# Patient Record
Sex: Female | Born: 1935
Health system: Southern US, Community
[De-identification: ages and names within clinical notes are randomized; demographics above are authoritative.]

## PROBLEM LIST (undated history)

## (undated) DIAGNOSIS — I509 Heart failure, unspecified: Secondary | ICD-10-CM

## (undated) DIAGNOSIS — K219 Gastro-esophageal reflux disease without esophagitis: Secondary | ICD-10-CM

## (undated) DIAGNOSIS — G8929 Other chronic pain: Secondary | ICD-10-CM

## (undated) DIAGNOSIS — Z8719 Personal history of other diseases of the digestive system: Secondary | ICD-10-CM

## (undated) DIAGNOSIS — E079 Disorder of thyroid, unspecified: Secondary | ICD-10-CM

## (undated) DIAGNOSIS — L03115 Cellulitis of right lower limb: Secondary | ICD-10-CM

## (undated) DIAGNOSIS — M545 Low back pain, unspecified: Secondary | ICD-10-CM

## (undated) DIAGNOSIS — I10 Essential (primary) hypertension: Secondary | ICD-10-CM

## (undated) DIAGNOSIS — M1990 Unspecified osteoarthritis, unspecified site: Secondary | ICD-10-CM

## (undated) DIAGNOSIS — J449 Chronic obstructive pulmonary disease, unspecified: Secondary | ICD-10-CM

## (undated) DIAGNOSIS — G4700 Insomnia, unspecified: Secondary | ICD-10-CM

## (undated) DIAGNOSIS — G2581 Restless legs syndrome: Secondary | ICD-10-CM

## (undated) DIAGNOSIS — D649 Anemia, unspecified: Secondary | ICD-10-CM

## (undated) DIAGNOSIS — E785 Hyperlipidemia, unspecified: Secondary | ICD-10-CM

## (undated) DIAGNOSIS — I739 Peripheral vascular disease, unspecified: Secondary | ICD-10-CM

## (undated) HISTORY — DX: Anemia, unspecified: D64.9

## (undated) HISTORY — DX: Restless legs syndrome: G25.81

## (undated) HISTORY — DX: Essential (primary) hypertension: I10

## (undated) HISTORY — DX: Cellulitis of right lower limb: L03.115

## (undated) HISTORY — PX: TONSILLECTOMY: SUR1361

## (undated) HISTORY — PX: APPENDECTOMY: SHX54

## (undated) HISTORY — DX: Disorder of thyroid, unspecified: E07.9

## (undated) HISTORY — PX: CATARACT EXTRACTION W/ INTRAOCULAR LENS  IMPLANT, BILATERAL: SHX1307

## (undated) HISTORY — PX: THYROID SURGERY: SHX805

## (undated) HISTORY — PX: VAGINAL HYSTERECTOMY: SUR661

## (undated) HISTORY — DX: Peripheral vascular disease, unspecified: I73.9

## (undated) SURGERY — Surgical Case
Anesthesia: *Unknown

---

## 1998-09-20 ENCOUNTER — Other Ambulatory Visit: Admission: RE | Admit: 1998-09-20 | Discharge: 1998-09-20 | Payer: Self-pay | Admitting: Internal Medicine

## 1999-07-08 ENCOUNTER — Encounter: Payer: Self-pay | Admitting: Emergency Medicine

## 1999-07-08 ENCOUNTER — Emergency Department (HOSPITAL_COMMUNITY): Admission: EM | Admit: 1999-07-08 | Discharge: 1999-07-08 | Payer: Self-pay | Admitting: Emergency Medicine

## 1999-07-13 ENCOUNTER — Emergency Department (HOSPITAL_COMMUNITY): Admission: EM | Admit: 1999-07-13 | Discharge: 1999-07-13 | Payer: Self-pay | Admitting: *Deleted

## 1999-07-18 ENCOUNTER — Encounter: Payer: Self-pay | Admitting: Orthopedic Surgery

## 1999-07-18 ENCOUNTER — Ambulatory Visit (HOSPITAL_COMMUNITY): Admission: RE | Admit: 1999-07-18 | Discharge: 1999-07-18 | Payer: Self-pay | Admitting: Orthopedic Surgery

## 1999-12-28 ENCOUNTER — Ambulatory Visit (HOSPITAL_COMMUNITY): Admission: RE | Admit: 1999-12-28 | Discharge: 1999-12-28 | Payer: Self-pay | Admitting: Orthopedic Surgery

## 1999-12-28 ENCOUNTER — Encounter: Payer: Self-pay | Admitting: Orthopedic Surgery

## 2004-03-17 ENCOUNTER — Emergency Department (HOSPITAL_COMMUNITY): Admission: EM | Admit: 2004-03-17 | Discharge: 2004-03-17 | Payer: Self-pay | Admitting: Emergency Medicine

## 2004-11-22 ENCOUNTER — Ambulatory Visit: Payer: Self-pay | Admitting: Internal Medicine

## 2004-12-30 ENCOUNTER — Emergency Department (HOSPITAL_COMMUNITY): Admission: EM | Admit: 2004-12-30 | Discharge: 2004-12-30 | Payer: Self-pay | Admitting: Family Medicine

## 2005-05-21 ENCOUNTER — Ambulatory Visit: Payer: Self-pay | Admitting: Internal Medicine

## 2005-05-31 ENCOUNTER — Ambulatory Visit: Payer: Self-pay | Admitting: Internal Medicine

## 2005-06-11 ENCOUNTER — Ambulatory Visit: Payer: Self-pay

## 2005-08-08 ENCOUNTER — Emergency Department (HOSPITAL_COMMUNITY): Admission: EM | Admit: 2005-08-08 | Discharge: 2005-08-08 | Payer: Self-pay | Admitting: Family Medicine

## 2005-08-30 ENCOUNTER — Ambulatory Visit: Payer: Self-pay | Admitting: Internal Medicine

## 2005-09-05 ENCOUNTER — Ambulatory Visit: Payer: Self-pay | Admitting: Internal Medicine

## 2005-12-20 ENCOUNTER — Ambulatory Visit: Payer: Self-pay | Admitting: Internal Medicine

## 2006-02-18 ENCOUNTER — Ambulatory Visit: Payer: Self-pay | Admitting: Internal Medicine

## 2006-05-27 ENCOUNTER — Emergency Department (HOSPITAL_COMMUNITY): Admission: EM | Admit: 2006-05-27 | Discharge: 2006-05-27 | Payer: Self-pay | Admitting: Family Medicine

## 2006-06-12 ENCOUNTER — Ambulatory Visit: Payer: Self-pay | Admitting: Internal Medicine

## 2006-06-17 ENCOUNTER — Ambulatory Visit: Payer: Self-pay | Admitting: Internal Medicine

## 2006-09-14 ENCOUNTER — Emergency Department (HOSPITAL_COMMUNITY): Admission: EM | Admit: 2006-09-14 | Discharge: 2006-09-14 | Payer: Self-pay | Admitting: Family Medicine

## 2006-09-17 ENCOUNTER — Ambulatory Visit: Payer: Self-pay | Admitting: Internal Medicine

## 2006-12-18 ENCOUNTER — Ambulatory Visit: Payer: Self-pay | Admitting: Internal Medicine

## 2006-12-18 LAB — CONVERTED CEMR LAB
BUN: 14 mg/dL (ref 6–23)
Basophils Absolute: 0.2 10*3/uL — ABNORMAL HIGH (ref 0.0–0.1)
Basophils Relative: 2.3 % — ABNORMAL HIGH (ref 0.0–1.0)
Creatinine, Ser: 0.8 mg/dL (ref 0.4–1.2)
Eosinophils Relative: 3.1 % (ref 0.0–5.0)
HCT: 36.5 % (ref 36.0–46.0)
Hemoglobin: 11.7 g/dL — ABNORMAL LOW (ref 12.0–15.0)
Lymphocytes Relative: 35 % (ref 12.0–46.0)
MCHC: 32.2 g/dL (ref 30.0–36.0)
MCV: 88.9 fL (ref 78.0–100.0)
Monocytes Absolute: 0.6 10*3/uL (ref 0.2–0.7)
Monocytes Relative: 8 % (ref 3.0–11.0)
Neutro Abs: 3.8 10*3/uL (ref 1.4–7.7)
Neutrophils Relative %: 51.6 % (ref 43.0–77.0)
Platelets: 338 10*3/uL (ref 150–400)
Potassium: 4.1 meq/L (ref 3.5–5.1)
RBC: 4.1 M/uL (ref 3.87–5.11)
RDW: 12.8 % (ref 11.5–14.6)
TSH: 2.07 microintl units/mL (ref 0.35–5.50)
WBC: 7.4 10*3/uL (ref 4.5–10.5)

## 2007-01-05 ENCOUNTER — Emergency Department (HOSPITAL_COMMUNITY): Admission: EM | Admit: 2007-01-05 | Discharge: 2007-01-05 | Payer: Self-pay | Admitting: Emergency Medicine

## 2007-03-19 ENCOUNTER — Ambulatory Visit: Payer: Self-pay | Admitting: Internal Medicine

## 2007-06-06 ENCOUNTER — Emergency Department (HOSPITAL_COMMUNITY): Admission: EM | Admit: 2007-06-06 | Discharge: 2007-06-06 | Payer: Self-pay | Admitting: Family Medicine

## 2007-06-18 ENCOUNTER — Ambulatory Visit: Payer: Self-pay | Admitting: Internal Medicine

## 2007-09-14 ENCOUNTER — Emergency Department (HOSPITAL_COMMUNITY): Admission: EM | Admit: 2007-09-14 | Discharge: 2007-09-14 | Payer: Self-pay | Admitting: Emergency Medicine

## 2007-09-25 ENCOUNTER — Ambulatory Visit: Payer: Self-pay | Admitting: Internal Medicine

## 2007-09-25 DIAGNOSIS — K219 Gastro-esophageal reflux disease without esophagitis: Secondary | ICD-10-CM | POA: Insufficient documentation

## 2007-09-25 DIAGNOSIS — G2581 Restless legs syndrome: Secondary | ICD-10-CM | POA: Insufficient documentation

## 2007-09-25 DIAGNOSIS — M545 Low back pain, unspecified: Secondary | ICD-10-CM | POA: Insufficient documentation

## 2007-09-25 DIAGNOSIS — I10 Essential (primary) hypertension: Secondary | ICD-10-CM | POA: Insufficient documentation

## 2007-09-25 DIAGNOSIS — M810 Age-related osteoporosis without current pathological fracture: Secondary | ICD-10-CM | POA: Insufficient documentation

## 2007-10-01 ENCOUNTER — Encounter: Payer: Self-pay | Admitting: Internal Medicine

## 2007-10-03 ENCOUNTER — Emergency Department (HOSPITAL_COMMUNITY): Admission: EM | Admit: 2007-10-03 | Discharge: 2007-10-03 | Payer: Self-pay | Admitting: Emergency Medicine

## 2007-10-07 ENCOUNTER — Emergency Department (HOSPITAL_COMMUNITY): Admission: EM | Admit: 2007-10-07 | Discharge: 2007-10-07 | Payer: Self-pay | Admitting: Emergency Medicine

## 2007-10-08 ENCOUNTER — Ambulatory Visit: Payer: Self-pay | Admitting: Internal Medicine

## 2007-10-08 ENCOUNTER — Inpatient Hospital Stay (HOSPITAL_COMMUNITY): Admission: EM | Admit: 2007-10-08 | Discharge: 2007-10-13 | Payer: Self-pay | Admitting: Emergency Medicine

## 2007-10-20 ENCOUNTER — Ambulatory Visit (HOSPITAL_COMMUNITY): Admission: RE | Admit: 2007-10-20 | Discharge: 2007-10-20 | Payer: Self-pay | Admitting: Internal Medicine

## 2007-11-09 ENCOUNTER — Ambulatory Visit: Payer: Self-pay | Admitting: Internal Medicine

## 2007-11-09 DIAGNOSIS — Z87440 Personal history of urinary (tract) infections: Secondary | ICD-10-CM | POA: Insufficient documentation

## 2007-11-09 DIAGNOSIS — D509 Iron deficiency anemia, unspecified: Secondary | ICD-10-CM | POA: Insufficient documentation

## 2007-11-09 LAB — CONVERTED CEMR LAB
Basophils Absolute: 0.3 10*3/uL — ABNORMAL HIGH (ref 0.0–0.1)
Basophils Relative: 3 % — ABNORMAL HIGH (ref 0.0–1.0)
Eosinophils Absolute: 0.2 10*3/uL (ref 0.0–0.6)
Eosinophils Relative: 1.6 % (ref 0.0–5.0)
HCT: 38.2 % (ref 36.0–46.0)
Hemoglobin: 12.9 g/dL (ref 12.0–15.0)
Iron: 52 ug/dL (ref 42–145)
Lymphocytes Relative: 35.7 % (ref 12.0–46.0)
MCHC: 33.7 g/dL (ref 30.0–36.0)
MCV: 86.7 fL (ref 78.0–100.0)
Monocytes Absolute: 1 10*3/uL — ABNORMAL HIGH (ref 0.2–0.7)
Monocytes Relative: 9.9 % (ref 3.0–11.0)
Neutro Abs: 5.3 10*3/uL (ref 1.4–7.7)
Neutrophils Relative %: 49.8 % (ref 43.0–77.0)
Platelets: 347 10*3/uL (ref 150–400)
RBC: 4.4 M/uL (ref 3.87–5.11)
RDW: 12.8 % (ref 11.5–14.6)
WBC: 10.6 10*3/uL — ABNORMAL HIGH (ref 4.5–10.5)

## 2007-11-10 ENCOUNTER — Encounter: Payer: Self-pay | Admitting: Internal Medicine

## 2007-12-08 ENCOUNTER — Telehealth: Payer: Self-pay | Admitting: Internal Medicine

## 2007-12-16 ENCOUNTER — Encounter: Payer: Self-pay | Admitting: Internal Medicine

## 2007-12-22 ENCOUNTER — Emergency Department (HOSPITAL_COMMUNITY): Admission: EM | Admit: 2007-12-22 | Discharge: 2007-12-22 | Payer: Self-pay | Admitting: Emergency Medicine

## 2007-12-24 ENCOUNTER — Ambulatory Visit: Payer: Self-pay | Admitting: Internal Medicine

## 2007-12-30 ENCOUNTER — Ambulatory Visit: Payer: Self-pay | Admitting: Internal Medicine

## 2008-02-03 ENCOUNTER — Encounter: Payer: Self-pay | Admitting: Internal Medicine

## 2008-02-04 ENCOUNTER — Emergency Department (HOSPITAL_COMMUNITY): Admission: EM | Admit: 2008-02-04 | Discharge: 2008-02-04 | Payer: Self-pay | Admitting: Family Medicine

## 2008-02-15 ENCOUNTER — Encounter: Admission: RE | Admit: 2008-02-15 | Discharge: 2008-02-15 | Payer: Self-pay | Admitting: Orthopaedic Surgery

## 2008-03-01 ENCOUNTER — Ambulatory Visit: Payer: Self-pay | Admitting: Internal Medicine

## 2008-03-09 ENCOUNTER — Encounter: Payer: Self-pay | Admitting: Internal Medicine

## 2008-04-28 ENCOUNTER — Ambulatory Visit: Payer: Self-pay | Admitting: Internal Medicine

## 2008-04-28 LAB — CONVERTED CEMR LAB
Basophils Absolute: 0.1 10*3/uL (ref 0.0–0.1)
Basophils Relative: 1.4 % — ABNORMAL HIGH (ref 0.0–1.0)
Eosinophils Absolute: 0.4 10*3/uL (ref 0.0–0.7)
Eosinophils Relative: 3.9 % (ref 0.0–5.0)
HCT: 37.9 % (ref 36.0–46.0)
Hemoglobin: 12.4 g/dL (ref 12.0–15.0)
Iron: 80 ug/dL (ref 42–145)
Lymphocytes Relative: 38.8 % (ref 12.0–46.0)
MCHC: 32.7 g/dL (ref 30.0–36.0)
MCV: 89.7 fL (ref 78.0–100.0)
Monocytes Absolute: 0.7 10*3/uL (ref 0.1–1.0)
Monocytes Relative: 7.4 % (ref 3.0–12.0)
Neutro Abs: 4.6 10*3/uL (ref 1.4–7.7)
Neutrophils Relative %: 48.5 % (ref 43.0–77.0)
Platelets: 285 10*3/uL (ref 150–400)
RBC: 4.22 M/uL (ref 3.87–5.11)
RDW: 12.7 % (ref 11.5–14.6)
WBC: 9.5 10*3/uL (ref 4.5–10.5)

## 2008-07-14 ENCOUNTER — Encounter: Payer: Self-pay | Admitting: Internal Medicine

## 2008-07-14 ENCOUNTER — Ambulatory Visit: Payer: Self-pay | Admitting: Internal Medicine

## 2008-07-26 ENCOUNTER — Ambulatory Visit: Payer: Self-pay | Admitting: Internal Medicine

## 2008-07-28 LAB — CONVERTED CEMR LAB: Vit D, 1,25-Dihydroxy: 12 — ABNORMAL LOW (ref 30–89)

## 2008-08-15 ENCOUNTER — Encounter: Payer: Self-pay | Admitting: *Deleted

## 2008-10-24 ENCOUNTER — Ambulatory Visit: Payer: Self-pay | Admitting: Internal Medicine

## 2008-10-24 LAB — CONVERTED CEMR LAB
BUN: 19 mg/dL (ref 6–23)
Basophils Absolute: 0.1 10*3/uL (ref 0.0–0.1)
Basophils Relative: 0.9 % (ref 0.0–3.0)
CO2: 30 meq/L (ref 19–32)
Calcium: 8.8 mg/dL (ref 8.4–10.5)
Chloride: 105 meq/L (ref 96–112)
Creatinine, Ser: 0.8 mg/dL (ref 0.4–1.2)
Eosinophils Absolute: 0.2 10*3/uL (ref 0.0–0.7)
Eosinophils Relative: 2 % (ref 0.0–5.0)
GFR calc Af Amer: 90 mL/min
GFR calc non Af Amer: 74 mL/min
Glucose, Bld: 96 mg/dL (ref 70–99)
HCT: 36 % (ref 36.0–46.0)
Hemoglobin: 12.4 g/dL (ref 12.0–15.0)
Iron: 61 ug/dL (ref 42–145)
Lymphocytes Relative: 25.1 % (ref 12.0–46.0)
MCHC: 34.3 g/dL (ref 30.0–36.0)
MCV: 88.9 fL (ref 78.0–100.0)
Monocytes Absolute: 0.6 10*3/uL (ref 0.1–1.0)
Monocytes Relative: 5.8 % (ref 3.0–12.0)
Neutro Abs: 7.3 10*3/uL (ref 1.4–7.7)
Neutrophils Relative %: 66.2 % (ref 43.0–77.0)
Platelets: 268 10*3/uL (ref 150–400)
Potassium: 4.2 meq/L (ref 3.5–5.1)
RBC: 4.05 M/uL (ref 3.87–5.11)
RDW: 13 % (ref 11.5–14.6)
Saturation Ratios: 16.2 % — ABNORMAL LOW (ref 20.0–50.0)
Sodium: 139 meq/L (ref 135–145)
Transferrin: 269.1 mg/dL (ref >=212.0)
WBC: 11 10*3/uL — ABNORMAL HIGH (ref 4.5–10.5)

## 2008-10-26 ENCOUNTER — Emergency Department (HOSPITAL_COMMUNITY): Admission: EM | Admit: 2008-10-26 | Discharge: 2008-10-26 | Payer: Self-pay | Admitting: Emergency Medicine

## 2009-02-22 ENCOUNTER — Ambulatory Visit: Payer: Self-pay | Admitting: Internal Medicine

## 2009-02-22 LAB — CONVERTED CEMR LAB
BUN: 14 mg/dL (ref 6–23)
CO2: 30 meq/L (ref 19–32)
Calcium: 9.1 mg/dL (ref 8.4–10.5)
Chloride: 107 meq/L (ref 96–112)
Creatinine, Ser: 0.6 mg/dL (ref 0.4–1.2)
GFR calc non Af Amer: 103.45 mL/min (ref >60)
Glucose, Bld: 83 mg/dL (ref 70–99)
Potassium: 4.1 meq/L (ref 3.5–5.1)
Sodium: 143 meq/L (ref 135–145)

## 2009-05-24 ENCOUNTER — Ambulatory Visit: Payer: Self-pay | Admitting: Internal Medicine

## 2009-05-24 DIAGNOSIS — J440 Chronic obstructive pulmonary disease with acute lower respiratory infection: Secondary | ICD-10-CM | POA: Insufficient documentation

## 2009-07-30 ENCOUNTER — Emergency Department (HOSPITAL_COMMUNITY): Admission: EM | Admit: 2009-07-30 | Discharge: 2009-07-30 | Payer: Self-pay | Admitting: Emergency Medicine

## 2009-09-20 ENCOUNTER — Ambulatory Visit: Payer: Self-pay | Admitting: Internal Medicine

## 2009-09-20 DIAGNOSIS — J449 Chronic obstructive pulmonary disease, unspecified: Secondary | ICD-10-CM | POA: Insufficient documentation

## 2009-09-20 DIAGNOSIS — K5909 Other constipation: Secondary | ICD-10-CM | POA: Insufficient documentation

## 2009-09-20 LAB — CONVERTED CEMR LAB
ALT: 16 units/L (ref 0–35)
AST: 19 units/L (ref 0–37)
Albumin: 3.6 g/dL (ref 3.5–5.2)
Alkaline Phosphatase: 89 units/L (ref 39–117)
Basophils Absolute: 0 10*3/uL (ref 0.0–0.1)
Basophils Relative: 0.3 % (ref 0.0–3.0)
Bilirubin, Direct: 0.1 mg/dL (ref 0.0–0.3)
Eosinophils Absolute: 0.4 10*3/uL (ref 0.0–0.7)
Eosinophils Relative: 4.2 % (ref 0.0–5.0)
HCT: 37.1 % (ref 36.0–46.0)
Hemoglobin: 12.3 g/dL (ref 12.0–15.0)
Iron: 66 ug/dL (ref 42–145)
Lymphocytes Relative: 27.1 % (ref 12.0–46.0)
Lymphs Abs: 2.4 10*3/uL (ref 0.7–4.0)
MCHC: 33.2 g/dL (ref 30.0–36.0)
MCV: 90.2 fL (ref 78.0–100.0)
Monocytes Absolute: 0.7 10*3/uL (ref 0.1–1.0)
Monocytes Relative: 7.6 % (ref 3.0–12.0)
Neutro Abs: 5.4 10*3/uL (ref 1.4–7.7)
Neutrophils Relative %: 60.8 % (ref 43.0–77.0)
Platelets: 293 10*3/uL (ref 150.0–400.0)
RBC: 4.11 M/uL (ref 3.87–5.11)
RDW: 13.5 % (ref 11.5–14.6)
Saturation Ratios: 16.3 % — ABNORMAL LOW (ref 20.0–50.0)
TSH: 1.1 microintl units/mL (ref 0.35–5.50)
Total Bilirubin: 0.4 mg/dL (ref 0.3–1.2)
Total Protein: 7.3 g/dL (ref 6.0–8.3)
Transferrin: 289 mg/dL (ref 212.0–360.0)
WBC: 8.9 10*3/uL (ref 4.5–10.5)

## 2009-11-08 ENCOUNTER — Emergency Department (HOSPITAL_COMMUNITY): Admission: EM | Admit: 2009-11-08 | Discharge: 2009-11-08 | Payer: Self-pay | Admitting: Family Medicine

## 2009-11-27 ENCOUNTER — Ambulatory Visit: Payer: Self-pay | Admitting: Internal Medicine

## 2010-01-08 ENCOUNTER — Ambulatory Visit: Payer: Self-pay | Admitting: Internal Medicine

## 2010-01-08 LAB — CONVERTED CEMR LAB
Basophils Absolute: 0.1 10*3/uL (ref 0.0–0.1)
Basophils Relative: 0.7 % (ref 0.0–3.0)
Eosinophils Absolute: 0.3 10*3/uL (ref 0.0–0.7)
Eosinophils Relative: 3.8 % (ref 0.0–5.0)
HCT: 37.7 % (ref 36.0–46.0)
Hemoglobin: 12.3 g/dL (ref 12.0–15.0)
Iron: 69 ug/dL (ref 42–145)
Lymphocytes Relative: 33.5 % (ref 12.0–46.0)
Lymphs Abs: 2.7 10*3/uL (ref 0.7–4.0)
MCHC: 32.6 g/dL (ref 30.0–36.0)
MCV: 89.7 fL (ref 78.0–100.0)
Monocytes Absolute: 0.6 10*3/uL (ref 0.1–1.0)
Monocytes Relative: 6.9 % (ref 3.0–12.0)
Neutro Abs: 4.5 10*3/uL (ref 1.4–7.7)
Neutrophils Relative %: 55.1 % (ref 43.0–77.0)
Platelets: 265 10*3/uL (ref 150.0–400.0)
RBC: 4.2 M/uL (ref 3.87–5.11)
RDW: 12.3 % (ref 11.5–14.6)
Saturation Ratios: 17.6 % — ABNORMAL LOW (ref 20.0–50.0)
Transferrin: 279.3 mg/dL (ref 212.0–360.0)
WBC: 8.2 10*3/uL (ref 4.5–10.5)

## 2010-01-09 ENCOUNTER — Encounter (INDEPENDENT_AMBULATORY_CARE_PROVIDER_SITE_OTHER): Payer: Self-pay | Admitting: *Deleted

## 2010-02-13 ENCOUNTER — Encounter (INDEPENDENT_AMBULATORY_CARE_PROVIDER_SITE_OTHER): Payer: Self-pay | Admitting: *Deleted

## 2010-02-14 ENCOUNTER — Ambulatory Visit: Payer: Self-pay | Admitting: Gastroenterology

## 2010-02-19 ENCOUNTER — Ambulatory Visit: Payer: Self-pay | Admitting: Internal Medicine

## 2010-02-20 ENCOUNTER — Ambulatory Visit: Payer: Self-pay | Admitting: Internal Medicine

## 2010-02-21 LAB — CONVERTED CEMR LAB: Vit D, 25-Hydroxy: 28 ng/mL — ABNORMAL LOW (ref 30–89)

## 2010-02-28 ENCOUNTER — Ambulatory Visit: Payer: Self-pay | Admitting: Gastroenterology

## 2010-02-28 LAB — HM COLONOSCOPY

## 2010-03-02 ENCOUNTER — Encounter: Payer: Self-pay | Admitting: Gastroenterology

## 2010-03-27 ENCOUNTER — Encounter: Payer: Self-pay | Admitting: Internal Medicine

## 2010-04-02 ENCOUNTER — Emergency Department (HOSPITAL_COMMUNITY): Admission: EM | Admit: 2010-04-02 | Discharge: 2010-04-02 | Payer: Self-pay | Admitting: Family Medicine

## 2010-04-23 ENCOUNTER — Ambulatory Visit: Payer: Self-pay | Admitting: Internal Medicine

## 2010-07-16 ENCOUNTER — Ambulatory Visit: Payer: Self-pay | Admitting: Internal Medicine

## 2010-07-16 DIAGNOSIS — M25559 Pain in unspecified hip: Secondary | ICD-10-CM | POA: Insufficient documentation

## 2010-07-16 LAB — CONVERTED CEMR LAB
Basophils Absolute: 0.1 10*3/uL (ref 0.0–0.1)
Basophils Relative: 0.7 % (ref 0.0–3.0)
Eosinophils Absolute: 0.5 10*3/uL (ref 0.0–0.7)
Eosinophils Relative: 4.8 % (ref 0.0–5.0)
HCT: 35.7 % — ABNORMAL LOW (ref 36.0–46.0)
Hemoglobin: 12 g/dL (ref 12.0–15.0)
Iron: 56 ug/dL (ref 42–145)
Lymphocytes Relative: 21.7 % (ref 12.0–46.0)
Lymphs Abs: 2.3 10*3/uL (ref 0.7–4.0)
MCHC: 33.7 g/dL (ref 30.0–36.0)
MCV: 89.3 fL (ref 78.0–100.0)
Monocytes Absolute: 0.8 10*3/uL (ref 0.1–1.0)
Monocytes Relative: 8.1 % (ref 3.0–12.0)
Neutro Abs: 6.8 10*3/uL (ref 1.4–7.7)
Neutrophils Relative %: 64.7 % (ref 43.0–77.0)
Platelets: 288 10*3/uL (ref 150.0–400.0)
RBC: 3.99 M/uL (ref 3.87–5.11)
RDW: 13.7 % (ref 11.5–14.6)
Saturation Ratios: 14.8 % — ABNORMAL LOW (ref 20.0–50.0)
Transferrin: 269.9 mg/dL (ref 212.0–360.0)
WBC: 10.4 10*3/uL (ref 4.5–10.5)

## 2010-07-27 ENCOUNTER — Emergency Department (HOSPITAL_COMMUNITY): Admission: EM | Admit: 2010-07-27 | Discharge: 2010-07-27 | Payer: Self-pay | Admitting: Family Medicine

## 2010-10-15 ENCOUNTER — Ambulatory Visit: Payer: Self-pay | Admitting: Internal Medicine

## 2010-10-22 ENCOUNTER — Encounter: Payer: Self-pay | Admitting: Internal Medicine

## 2010-10-26 ENCOUNTER — Encounter: Admission: RE | Admit: 2010-10-26 | Discharge: 2010-10-26 | Payer: Self-pay | Admitting: Internal Medicine

## 2010-11-14 ENCOUNTER — Emergency Department (HOSPITAL_COMMUNITY)
Admission: EM | Admit: 2010-11-14 | Discharge: 2010-11-14 | Payer: Self-pay | Source: Home / Self Care | Admitting: Emergency Medicine

## 2010-12-18 ENCOUNTER — Ambulatory Visit
Admission: RE | Admit: 2010-12-18 | Discharge: 2010-12-18 | Payer: Self-pay | Source: Home / Self Care | Attending: Internal Medicine | Admitting: Internal Medicine

## 2010-12-31 ENCOUNTER — Other Ambulatory Visit: Payer: Self-pay | Admitting: Rehabilitation

## 2010-12-31 DIAGNOSIS — M549 Dorsalgia, unspecified: Secondary | ICD-10-CM

## 2011-01-01 LAB — HM DEXA SCAN

## 2011-01-03 NOTE — Letter (Signed)
Summary: [facility] Clare Charon, MD  [facility]. Clare Charon, MD   Imported By: Maryln Gottron 05/04/2010 14:22:45  _____________________________________________________________________  External Attachment:    Type:   Image     Comment:   External Document

## 2011-01-03 NOTE — Assessment & Plan Note (Signed)
Summary: 3 month fup//ccm   Vital Signs:  Patient profile:   77 year old female Weight:      194 pounds Temp:     98.6 degrees F oral Pulse rhythm:   regular BP sitting:   124 / 80  Vitals Entered By: Lynann Beaver CMA AAMA (October 15, 2010 10:11 AM) CC: rov, Hypertension Management Is Patient Diabetic? No   Primary Care Provider:  Stacie Glaze MD  CC:  rov and Hypertension Management.  History of Present Illness: has restless legs on medications the diagonostic delima is that she has symptoms of spinal stenosis as well and this may be the main issue Has hx of lumbar disc dx The legs are jumpy at night and she states that a walk seems to relax them Hip was xrayed last visit with not significant finding pointing to the fact that the pain is likely referred from the back Dr Noel Gerold consulted in 2009 and facet injections tried with some success needs referral back to [facility] and consideration for PT  This is complicated by COPD... she uses inhailer but stil feels she has increased SOB will need PFTS  Hypertension History:      She denies headache, chest pain, palpitations, dyspnea with exertion, orthopnea, PND, peripheral edema, visual symptoms, neurologic problems, syncope, and side effects from treatment.        Positive major cardiovascular risk factors include female age 61 years old or older and hypertension.  Negative major cardiovascular risk factors include negative family history for ischemic heart disease and non-tobacco-user status.     Problems Prior to Update: 1)  Hip Pain, Bilateral  (ICD-719.45) 2)  Chronic Obstructive Pulmonary Disease, Moderate  (ICD-496) 3)  Constipation, Chronic  (ICD-564.09) 4)  Obst Chronic Bronchitis W/acute Bronchitis  (ICD-491.22) 5)  Anemia-iron Deficiency  (ICD-280.9) 6)  Uti's, Hx of  (ICD-V13.00) 7)  Low Back Pain  (ICD-724.2) 8)  Family History Breast Cancer 1st Degree Relative <50  (ICD-V16.3) 9)  Preventive Health Care   (ICD-V70.0) 10)  Family History of Cad Female 1st Degree Relative <50  (ICD-V17.3) 11)  Restless Leg Syndrome  (ICD-333.94) 12)  Osteoporosis  (ICD-733.00) 13)  Hypertension  (ICD-401.9) 14)  Gerd  (ICD-530.81)  Medications Prior to Update: 1)  Os-Cal 500 + D 500-200 Mg-Unit  Tabs (Calcium Carbonate-Vitamin D) .... Once Daily 2)  Requip 0.5 Mg  Tabs (Ropinirole Hcl) .... At Bedtime 3)  Vitamin D 04540 Unit Caps (Ergocalciferol) .... One Twice A Week 4)  Align  Caps (Probiotic Product) .... One By Mouth Daily 5)  Benefiber  Powd (Wheat Dextrin) .... One Pack Twice A Day 6)  Dulera 100-5 Mcg/act Aero (Mometasone Furo-Formoterol Fum) .... Two Puff Two Times A Day 7)  Dexilant 60 Mg Cpdr (Dexlansoprazole) .... One By Mouth Daily 8)  Tribenzor 40-10-12.5 Mg Tabs (Olmesartan-Amlodipine-Hctz) .... One By Mouth Daily 9)  Meloxicam 15 Mg Tabs (Meloxicam) .... One By Mouth Daily  Current Medications (verified): 1)  Os-Cal 500 + D 500-200 Mg-Unit  Tabs (Calcium Carbonate-Vitamin D) .... Once Daily 2)  Ropinirole Hcl 1 Mg Tabs (Ropinirole Hcl) .... One By Mouth Q Hs 3)  Vitamin D 98119 Unit Caps (Ergocalciferol) .... One Twice A Week 4)  Align  Caps (Probiotic Product) .... One By Mouth Daily 5)  Benefiber  Powd (Wheat Dextrin) .... One Pack Twice A Day 6)  Dulera 100-5 Mcg/act Aero (Mometasone Furo-Formoterol Fum) .... Two Puff Two Times A Day 7)  Dexilant 60 Mg Cpdr (  Dexlansoprazole) .... One By Mouth Daily 8)  Tribenzor 40-10-12.5 Mg Tabs (Olmesartan-Amlodipine-Hctz) .... One By Mouth Daily 9)  Meloxicam 15 Mg Tabs (Meloxicam) .... One By Mouth Daily  Allergies (verified): 1)  ! Codeine  Past History:  Family History: Last updated: 11/18/2007 father died from MI at 80 Family History of CAD Female 1st degree relative <50 mother died at 57  Social History: Last updated: 11-18-07 Married Former Smoker  Risk Factors: Caffeine Use: 1 (09/25/2007)  Risk Factors: Smoking Status: quit  (07/16/2010) Passive Smoke Exposure: no (07/16/2010)  Past medical, surgical, family and social histories (including risk factors) reviewed, and no changes noted (except as noted below).  Past Medical History: Reviewed history from 18-Nov-2007 and no changes required. Hyperthyroidism restlless legs Osteoporosis constipation Anemia-iron deficiency  Past Surgical History: Reviewed history from 03/01/2008 and no changes required. Appendectomy Hysterectomy Tonsillectomy Cataract extraction  Family History: Reviewed history from 11-18-07 and no changes required. father died from MI at 58 Family History of CAD Female 1st degree relative <50 mother died at 25  Social History: Reviewed history from 11/18/07 and no changes required. Married Former Smoker  Review of Systems  The patient denies anorexia, fever, weight loss, weight gain, vision loss, decreased hearing, hoarseness, chest pain, syncope, dyspnea on exertion, peripheral edema, prolonged cough, headaches, hemoptysis, abdominal pain, melena, hematochezia, severe indigestion/heartburn, hematuria, incontinence, genital sores, muscle weakness, suspicious skin lesions, transient blindness, difficulty walking, depression, unusual weight change, abnormal bleeding, enlarged lymph nodes, angioedema, and breast masses.    Physical Exam  General:  alert and pale.   Head:  normocephalic and no abnormalities palpated.   Eyes:  pupils equal and pupils round.   Ears:  R ear normal and L ear normal.   Mouth:  pharynx pink and moist.   Neck:  No deformities, masses, or tenderness noted. Lungs:  normal respiratory effort and no wheezes.   Heart:  normal rate and regular rhythm.   Abdomen:  soft and non-tender.   Msk:  joint tenderness and joint swelling.   Pulses:  R and L carotid,radial,femoral,dorsalis pedis and posterior tibial pulses are full and equal bilaterally Extremities:  trace left pedal edema and trace right pedal edema.     Neurologic:  alert & oriented X3 and abnormal gait.     Impression & Recommendations:  Problem # 1:  CHRONIC OBSTRUCTIVE PULMONARY DISEASE, MODERATE (ICD-496) Assessment Deteriorated  Her updated medication list for this problem includes:    Dulera 100-5 Mcg/act Aero (Mometasone furo-formoterol fum) .Marland Kitchen..Marland Kitchen Two puff two times a day  Vaccines Reviewed: Pneumovax: Historical (12/02/2005)   Flu Vax: Fluvax 3+ (09/20/2009)  Problem # 2:  LOW BACK PAIN (ICD-724.2) Assessment: Deteriorated refer for PT and to Dr Noel Gerold... then injections in the past helped but this was at a time that pt had to care for husband and she did not follow up on the problem Her updated medication list for this problem includes:    Meloxicam 15 Mg Tabs (Meloxicam) ..... One by mouth daily  Orders: Orthopedic Referral (Ortho) Physical Therapy Referral (PT) T-Lumbar Spine Complete, 5 Views [phone])  Discussed use of moist heat or ice, modified activities, medications, and stretching/strengthening exercises. Back care instructions given. To be seen in 2 weeks if no improvement; sooner if worsening of symptoms.   Problem # 3:  RESTLESS LEG SYNDROME (ICD-333.94) on meidcations  Problem # 4:  HYPERTENSION (ICD-401.9)  Her updated medication list for this problem includes:    Tribenzor 40-10-12.5 Mg Tabs (Olmesartan-amlodipine-hctz) .Marland KitchenMarland KitchenMarland KitchenMarland Kitchen  One by mouth daily  BP today: 124/80 Prior BP: 136/84 (07/16/2010)  Prior 10 Yr Risk Heart Disease: Not enough information (09/25/2007)  Labs Reviewed: K+: 4.1 (02/22/2009) Creat: : 0.6 (02/22/2009)     Complete Medication List: 1)  Os-cal 500 + D 500-200 Mg-unit Tabs (Calcium carbonate-vitamin d) .... Once daily 2)  Ropinirole Hcl 1 Mg Tabs (Ropinirole hcl) .... One by mouth q hs 3)  Vitamin D [address]109 Unit Caps (Ergocalciferol) .... One twice a week 4)  Align Caps (Probiotic product) .... One by mouth daily 5)  Benefiber Powd (Wheat dextrin) .... One pack twice a day 6)   Dulera 100-5 Mcg/act Aero (Mometasone furo-formoterol fum) .... Two puff two times a day 7)  Dexilant 60 Mg Cpdr (Dexlansoprazole) .... One by mouth daily 8)  Tribenzor 40-10-12.5 Mg Tabs (Olmesartan-amlodipine-hctz) .... One by mouth daily 9)  Meloxicam 15 Mg Tabs (Meloxicam) .... One by mouth daily  Hypertension Assessment/Plan:      The patient's hypertensive risk group is category B: At least one risk factor (excluding diabetes) with no target organ damage.  Today's blood pressure is 124/80.  Her blood pressure goal is < 140/90.  Patient Instructions: 1)  Please schedule a follow-up appointment in 2 months. 2)  will need PFTS at time of visit Prescriptions: ROPINIROLE HCL 1 MG TABS (ROPINIROLE HCL) one by mouth q HS  #40 x 11   Entered and Authorized by:   Stacie Glaze MD   Signed by:   Stacie Glaze MD on 10/15/2010   Method used:   Electronically to        [facility] [address] [phone]* (retail)       [address]       [place], [place]  [address]       Ph: [phone] or [phone]       Fax: [phone]   RxID:   [id number]    Orders Added: 1)  Est. Patient Level IV [03474] 2)  Orthopedic Referral [Ortho] 3)  Physical Therapy Referral [PT] 4)  T-Lumbar Spine Complete, 5 Views [71110TC]

## 2011-01-03 NOTE — Letter (Signed)
Summary: [facility] Instructions  [facility]  [address] [place], [place] [address]   Phone: [phone]  Fax: [phone]       Cheryl Atkinson    1931/08/11    MRN: [id number]        Procedure Day Dorna Bloom:  Wednesday  02/28/10     Arrival Time:  7:30am      Procedure Time:  8:30am     Location of Procedure:                    _X _  [facility] (4th Floor)                        PREPARATION FOR COLONOSCOPY WITH MOVIPREP   Starting 5 days prior to your procedure  Friday Mar 08, 2023  do not eat nuts, seeds, popcorn, corn, beans, peas,  salads, or any raw vegetables.  Do not take any fiber supplements (e.g. Metamucil, Citrucel, and Benefiber).  THE DAY BEFORE YOUR PROCEDURE         DATE: 03/29   DAY:  Tuesday  1.  Drink clear liquids the entire day-NO SOLID FOOD  2.  Do not drink anything colored red or purple.  Avoid juices with pulp.  No orange juice.  3.  Drink at least 64 oz. (8 glasses) of fluid/clear liquids during the day to prevent dehydration and help the prep work efficiently.  CLEAR LIQUIDS INCLUDE: Water Jello Ice Popsicles Tea (sugar ok, no milk/cream) Powdered fruit flavored drinks Coffee (sugar ok, no milk/cream) Gatorade Juice: apple, white grape, white cranberry  Lemonade Clear bullion, consomm, broth Carbonated beverages (any kind) Strained chicken noodle soup Hard Candy                             4.  In the morning, mix first dose of MoviPrep solution:    Empty 1 Pouch A and 1 Pouch B into the disposable container    Add lukewarm drinking water to the top line of the container. Mix to dissolve    Refrigerate (mixed solution should be used within 24 hrs)  5.  Begin drinking the prep at 5:00 p.m. The MoviPrep container is divided by 4 marks.   Every 15 minutes drink the solution down to the next mark (approximately 8 oz) until the full liter is complete.   6.  Follow completed prep with 16 oz of clear liquid of your  choice (Nothing red or purple).  Continue to drink clear liquids until bedtime.  7.  Before going to bed, mix second dose of MoviPrep solution:    Empty 1 Pouch A and 1 Pouch B into the disposable container    Add lukewarm drinking water to the top line of the container. Mix to dissolve    Refrigerate  THE DAY OF YOUR PROCEDURE      DATE:  03/30  DAY:  Wednesday  Beginning at  3:30 a.m. (5 hours before procedure):         1. Every 15 minutes, drink the solution down to the next mark (approx 8 oz) until the full liter is complete.  2. Follow completed prep with 16 oz. of clear liquid of your choice.    3. You may drink clear liquids until 6:30am  (2 HOURS BEFORE PROCEDURE).   MEDICATION INSTRUCTIONS  Unless otherwise instructed, you should take regular prescription medications with a small  sip of water   as early as possible the morning of your procedure.   Additional medication instructions: Do not take Tribenzor day of procedure.         OTHER INSTRUCTIONS  You will need a responsible adult at least 75 years of age to accompany you and drive you home.   This person must remain in the waiting room during your procedure.  Wear loose fitting clothing that is easily removed.  Leave jewelry and other valuables at home.  However, you may wish to bring a book to read or  an iPod/MP3 player to listen to music as you wait for your procedure to start.  Remove all body piercing jewelry and leave at home.  Total time from sign-in until discharge is approximately 2-3 hours.  You should go home directly after your procedure and rest.  You can resume normal activities the  day after your procedure.  The day of your procedure you should not:   Drive   Make legal decisions   Operate machinery   Drink alcohol   Return to work  You will receive specific instructions about eating, activities and medications before you leave.    The above instructions have been reviewed  and explained to me by   Ezra Sites RN  February 14, 2010 9:23 AM     I fully understand and can verbalize these instructions _____________________________ Date _________

## 2011-01-03 NOTE — Assessment & Plan Note (Signed)
Summary: roa/db/PT RESCD FROM BUMP/CCM  Medications Added OS-CAL 500 + D 500-200 MG-UNIT  TABS (CALCIUM CARBONATE-VITAMIN D) once daily MICARDIS HCT 80-12.5 MG  TABS (TELMISARTAN-HCTZ) once daily BONIVA 150 MG  TABS (IBANDRONATE SODIUM) every month REQUIP 0.5 MG  TABS (ROPINIROLE HCL) at bedtime CELEBREX 200 MG  CAPS (CELECOXIB) one by mouth daily      Allergies Added: ! CODEINE  Vital Signs:  Patient Profile:   77 Years Old Female Height:     67 inches Weight:      173 pounds Temp:     98.1 degrees F oral Pulse rate:   76 / minute Resp:     14 per minute BP sitting:   150 / 88  (left arm)  Pt. in pain?   yes    Location:   lower back    Intensity:   7    Type:       sharp  Vitals Entered By: Willy Eddy, LPN (September 25, 2007 10:08 AM)                  Chief Complaint:  Back Pain.  History of Present Illness: follow up of chronic problems  Hypertension History:      She denies headache, chest pain, palpitations, dyspnea with exertion, orthopnea, PND, peripheral edema, visual symptoms, neurologic problems, and syncope.        Positive major cardiovascular risk factors include female age 39 years old or older and hypertension.  Negative major cardiovascular risk factors include negative family history for ischemic heart disease and non-tobacco-user status.     Current Allergies (reviewed today): ! CODEINE Updated/Current Medications (including changes made in today's visit):  OS-CAL 500 + D 500-200 MG-UNIT  TABS (CALCIUM CARBONATE-VITAMIN D) once daily MICARDIS HCT 80-12.5 MG  TABS (TELMISARTAN-HCTZ) once daily BONIVA 150 MG  TABS (IBANDRONATE SODIUM) every month REQUIP 0.5 MG  TABS (ROPINIROLE HCL) at bedtime CELEBREX 200 MG  CAPS (CELECOXIB) one by mouth daily   Past Medical History:    Reviewed history and no changes required:       Hypertension       Osteoporosis       GERD       Restless syndrome       Low back pain  Past Surgical History:  Reviewed history and no changes required:       Appendectomy       Hysterectomy       Tonsillectomy   Family History:    Reviewed history and no changes required:       father       Family History of CAD Female 1st degree relative <50       mother lived to 80       PUD       Family History Breast cancer 1st degree relative <50  SISTER  Social History:    Reviewed history and no changes required:       Married       Former Smoker       Alcohol use-no   Risk Factors:  Tobacco use:  quit    Year quit:  2000 Passive smoke exposure:  no Drug use:  no Caffeine use:  1 drinks per day Alcohol use:  no  Family History Risk Factors:    Family History of MI in females < 35 years old:  no    Family History of MI in males < 74 years old:  no  Review of Systems  The patient denies weight loss, chest pain, prolonged cough, abdominal pain, hematochezia, and severe indigestion/heartburn.     Physical Exam  General:     Well-developed,well-nourished,in no acute distress; alert,appropriate and cooperative throughout examination Head:     Normocephalic and atraumatic without obvious abnormalities. No apparent alopecia or balding. Mouth:     Oral mucosa and oropharynx without lesions or exudates.  Teeth in good repair. Neck:     No deformities, masses, or tenderness noted. Lungs:     normal respiratory effort and no crackles.   Heart:     normal rate and regular rhythm.      Impression & Recommendations:  Problem # 1:  Preventive Health Care (ICD-V70.0) lot [id number].Exp 05/31/2008 San[employer]i pasteur-left deltoid IM.0.62ml mamogram Orders: Gastroenterology Referral (GI)   Problem # 2:  HYPERTENSION (ICD-401.9) pooorly controlled possibly due to acute pain if remains elevated will need to increase micardis or add BB. Her updated medication list for this problem includes:    Micardis Hct 80-12.5 Mg Tabs (Telmisartan-hctz) ..... Once daily  BP today: 150/88  10 Yr Risk Heart  Disease: Not enough information  Labs Reviewed: Creat: 0.8 (12/18/2006)   Problem # 3:  RESTLESS LEG SYNDROME (ICD-333.94) on mirapex  Problem # 4:  FAMILY HISTORY BREAST CANCER 1ST DEGREE RELATIVE <50 (ICD-V16.3) mamogram ordered  Problem # 5:  LOW BACK PAIN (ICD-724.2) pain in hip is radicular from low back with classic radiation pattern to the top of the hip Her updated medication list for this problem includes:    Celebrex 200 Mg Caps (Celecoxib) ..... One by mouth daily   Medications Added to Medication List This Visit: 1)  Os-cal 500 + D 500-200 Mg-unit Tabs (Calcium carbonate-vitamin d) .... Once daily 2)  Micardis Hct 80-12.5 Mg Tabs (Telmisartan-hctz) .... Once daily 3)  Boniva 150 Mg Tabs (Ibandronate sodium) .... Every month 4)  Requip 0.5 Mg Tabs (Ropinirole hcl) .... At bedtime 5)  Celebrex 200 Mg Caps (Celecoxib) .... One by mouth daily  Complete Medication List: 1)  Os-cal 500 + D 500-200 Mg-unit Tabs (Calcium carbonate-vitamin d) .... Once daily 2)  Micardis Hct 80-12.5 Mg Tabs (Telmisartan-hctz) .... Once daily 3)  Boniva 150 Mg Tabs (Ibandronate sodium) .... Every month 4)  Requip 0.5 Mg Tabs (Ropinirole hcl) .... At bedtime 5)  Celebrex 200 Mg Caps (Celecoxib) .... One by mouth daily  Other Orders: Influenza Vaccine MCR (13086)  Hypertension Assessment/Plan:      The patient's hypertensive risk group is category B: At least one risk factor (excluding diabetes) with no target organ damage.  Today's blood pressure is 150/88.  Her blood pressure goal is < 140/90.   Patient Instructions: 1)  Please schedule a follow-up appointment in 3 months.    ]    Tetanus/Td Immunization History:    Tetanus/Td # 1:  Historical (11/01/1993)  Pneumovax Immunization History:    Pneumovax # 1:  Historical (12/02/2005)  Other Immunization History:    Zostavax # 1:  Zostavax (State) (02/17/2007)  Influenza Vaccine    Vaccine Type: Fluvax MCR    Given by: Willy Eddy, LPN  Flu Vaccine Consent Questions    Do you have a history of severe allergic reactions to this vaccine? no    Any prior history of allergic reactions to egg and/or gelatin? no    Do you have a sensitivity to the preservative Thimersol? no    Do you have a past history of Guillan-Barre  Syndrome? no    Do you currently have an acute febrile illness? no    Have you ever had a severe reaction to latex? no    Vaccine information given and explained to patient? yes    Are you currently pregnant? no

## 2011-01-03 NOTE — Assessment & Plan Note (Signed)
Summary: 4 month rov/mm   Vital Signs:  Patient profile:   77 year old female Height:      67 inches Weight:      175 pounds BMI:     27.51 Temp:     98.4 degrees F oral Pulse rate:   76 / minute Resp:     14 per minute BP sitting:   130 / 80  (left arm)  Vitals Entered By: Willy Eddy, LPN (February 22, 2009 9:14 AM)  CC:  roa-very nervous due to talking about caring for her husband with dementia.  History of Present Illness: pt has increased stress with care for husband initial reading were high but was iritated  Hypertension History:      She denies headache, chest pain, palpitations, dyspnea with exertion, orthopnea, PND, peripheral edema, visual symptoms, neurologic problems, syncope, and side effects from treatment.        Positive major cardiovascular risk factors include female age 30 years old or older and hypertension.  Negative major cardiovascular risk factors include negative family history for ischemic heart disease and non-tobacco-user status.     Problems Prior to Update: 1)  Anemia-iron Deficiency  (ICD-280.9) 2)  Uti's, Hx of  (ICD-V13.00) 3)  Low Back Pain  (ICD-724.2) 4)  Family History Breast Cancer 1st Degree Relative <50  (ICD-V16.3) 5)  Preventive Health Care  (ICD-V70.0) 6)  Family History of Cad Female 1st Degree Relative <50  (ICD-V17.3) 7)  Restless Leg Syndrome  (ICD-333.94) 8)  Osteoporosis  (ICD-733.00) 9)  Hypertension  (ICD-401.9) 10)  Gerd  (ICD-530.81)  Medications Prior to Update: 1)  Os-Cal 500 + D 500-200 Mg-Unit  Tabs (Calcium Carbonate-Vitamin D) .... Once Daily 2)  Micardis Hct 80-12.5 Mg  Tabs (Telmisartan-Hctz) .... Once Daily 3)  Boniva 150 Mg  Tabs (Ibandronate Sodium) .... Every Month 4)  Requip 0.5 Mg  Tabs (Ropinirole Hcl) .... At Bedtime 5)  Vitamin D 04540 Unit Caps (Ergocalciferol) .... One By Mouth Weekly  Current Medications (verified): 1)  Os-Cal 500 + D 500-200 Mg-Unit  Tabs (Calcium Carbonate-Vitamin D) .... Once  Daily 2)  Micardis Hct 80-12.5 Mg  Tabs (Telmisartan-Hctz) .... Once Daily 3)  Boniva 150 Mg  Tabs (Ibandronate Sodium) .... Every Month 4)  Requip 0.5 Mg  Tabs (Ropinirole Hcl) .... At Bedtime 5)  Vitamin D 98119 Unit Caps (Ergocalciferol) .... One By Mouth Weekly  Allergies (verified): 1)  ! Codeine  Past History:  Family History:    father died from MI at 59    Family History of CAD Female 1st degree relative <50    mother died at 14 (11/23/07)  Social History:    Married    Former Smoker     (23-Nov-2007)  Risk Factors:    Alcohol Use: N/A    >5 drinks/d w/in last 3 months: N/A    Caffeine Use: 1 (09/25/2007)    Diet: N/A    Exercise: N/A  Risk Factors:    Smoking Status: quit (23-Nov-2007)    Packs/Day: N/A    Cigars/wk: N/A    Pipe Use/wk: N/A    Cans of tobacco/wk: N/A    Passive Smoke Exposure: no (09/25/2007)  Past medical, surgical, family and social histories (including risk factors) reviewed, and no changes noted (except as noted below).  Past Medical History:    Reviewed history from 2007/11/23 and no changes required:    Hyperthyroidism    restlless legs    Osteoporosis  constipation    Anemia-iron deficiency  Past Surgical History:    Reviewed history from 03/01/2008 and no changes required:    Appendectomy    Hysterectomy    Tonsillectomy    Cataract extraction  Family History:    Reviewed history from 11/09/2007 and no changes required:       father died from MI at 68       Family History of CAD Female 1st degree relative <50       mother died at 38  Social History:    Reviewed history from 11/09/2007 and no changes required:       Married       Former Smoker  Review of Systems  The patient denies anorexia, fever, weight loss, weight gain, vision loss, decreased hearing, hoarseness, chest pain, syncope, dyspnea on exertion, peripheral edema, prolonged cough, headaches, hemoptysis, abdominal pain, melena, hematochezia, severe  indigestion/heartburn, hematuria, incontinence, genital sores, muscle weakness, suspicious skin lesions, transient blindness, difficulty walking, depression, unusual weight change, abnormal bleeding, enlarged lymph nodes, angioedema, and breast masses.    Physical Exam  General:  Well-developed,well-nourished,in no acute distress; alert,appropriate and cooperative throughout examination pale.   Eyes:  pupils equal and pupils round.   Ears:  R ear normal and L ear normal.   Mouth:  pharynx pink and moist.   Neck:  No deformities, masses, or tenderness noted. Lungs:  Normal respiratory effort, chest expands symmetrically. Lungs are clear to auscultation, no crackles or wheezes. Heart:  Normal rate and regular rhythm. S1 and S2 normal without gallop, murmur, click, rub or other extra sounds. Abdomen:  soft, non-tender, normal bowel sounds, and no distention.   Msk:  normal ROM, no joint tenderness, lumbar lordosis, and SI joint tenderness.   Extremities:  No clubbing, cyanosis, edema, or deformity noted with normal full range of motion of all joints.   Neurologic:  alert & oriented X3.   Skin:  turgor normal and color normal.     Impression & Recommendations:  Problem # 1:  ANEMIA-IRON DEFICIENCY (ICD-280.9)  normalized  The patient's hemoglobin is currently within the target range.  Problem # 2:  LOW BACK PAIN (ICD-724.2)  chronic pain  Discussed use of moist heat or ice, modified activities, medications, and stretching/strengthening exercises. Back care instructions given. To be seen in 2 weeks if no improvement; sooner if worsening of symptoms.   Orders: UA Dipstick w/o Micro (automated)  (81003)  Problem # 3:  HYPERTENSION (ICD-401.9)  good response, pt has significnat stressors but refuses medical hjelp and counsilling Her updated medication list for this problem includes:    Micardis Hct 80-12.5 Mg Tabs (Telmisartan-hctz) ..... Once daily  BP today: 130/80 Prior BP:  156/82 (10/24/2008)  Prior 10 Yr Risk Heart Disease: Not enough information (09/25/2007)  Labs Reviewed: K+: 4.2 (10/24/2008) Creat: : 0.8 (10/24/2008)     Orders: TLB-BMP (Basic Metabolic Panel-BMET) (80048-METABOL)  Problem # 4:  GERD (ICD-530.81)  Labs Reviewed: Hgb: 12.4 (10/24/2008)   Hct: 36.0 (10/24/2008)  Complete Medication List: 1)  Os-cal 500 + D 500-200 Mg-unit Tabs (Calcium carbonate-vitamin d) .... Once daily 2)  Micardis Hct 80-12.5 Mg Tabs (Telmisartan-hctz) .... Once daily 3)  Boniva 150 Mg Tabs (Ibandronate sodium) .... Every month 4)  Requip 0.5 Mg Tabs (Ropinirole hcl) .... At bedtime 5)  Vitamin D 21308 Unit Caps (Ergocalciferol) .... One by mouth weekly  Hypertension Assessment/Plan:      The patient's hypertensive risk group is category B: At least one  risk factor (excluding diabetes) with no target organ damage.  Today's blood pressure is 130/80.  Her blood pressure goal is < 140/90.  Patient Instructions: 1)  Please schedule a follow-up appointment in 3 months.  Appended Document: 4 month rov/mm  Laboratory Results   Urine Tests    Routine Urinalysis   Color: yellow Appearance: Clear Glucose: negative   (Normal Range: Negative) Bilirubin: negative   (Normal Range: Negative) Ketone: negative   (Normal Range: Negative) Spec. Gravity: 1.010   (Normal Range: 1.003-1.035) Blood: negative   (Normal Range: Negative) pH: 6.5   (Normal Range: 5.0-8.0) Protein: negative   (Normal Range: Negative) Urobilinogen: 0.2   (Normal Range: 0-1) Nitrite: negative   (Normal Range: Negative) Leukocyte Esterace: negative   (Normal Range: Negative)    Comments: Rita Ohara  February 22, 2009 4:26 PM

## 2011-01-03 NOTE — Assessment & Plan Note (Signed)
Summary: 3 month f/up//db   Vital Signs:  Patient Profile:   77 Years Old Female Height:     67 inches Weight:      168 pounds Temp:     98.5 degrees F oral Pulse rate:   80 / minute Resp:     14 per minute BP sitting:   124 / 84  (left arm)  Vitals Entered By: Willy Eddy, LPN (July 26, 2008 9:37 AM)                 Chief Complaint:  roa.  History of Present Illness:  Follow-Up Visit      This is a 77 year old woman who presents for Follow-up visit.  The patient denies chest pain, palpitations, dizziness, syncope, low blood sugar symptoms, high blood sugar symptoms, edema, SOB, DOE, PND, and orthopnea.  Since the last visit the patient notes problems with medications and being seen by a specialist.  The patient reports taking meds as prescribed.  When questioned about possible medication side effects, the patient notes none.      Current Allergies: ! CODEINE  Past Medical History:    Reviewed history from 11/09/2007 and no changes required:       Hyperthyroidism       restlless legs       Osteoporosis       constipation       Anemia-iron deficiency  Past Surgical History:    Reviewed history from 03/01/2008 and no changes required:       Appendectomy       Hysterectomy       Tonsillectomy       Cataract extraction   Family History:    Reviewed history from 11/09/2007 and no changes required:       father died from MI at 41       Family History of CAD Female 1st degree relative <50       mother died at 43  Social History:    Reviewed history from 11/09/2007 and no changes required:       Married       Former Smoker    Review of Systems  The patient denies anorexia, fever, weight loss, weight gain, vision loss, decreased hearing, hoarseness, chest pain, syncope, dyspnea on exertion, peripheral edema, prolonged cough, headaches, hemoptysis, abdominal pain, melena, hematochezia, severe indigestion/heartburn, hematuria, incontinence, genital sores,  muscle weakness, suspicious skin lesions, transient blindness, difficulty walking, depression, unusual weight change, abnormal bleeding, enlarged lymph nodes, angioedema, and breast masses.     Physical Exam  General:     Well-developed,well-nourished,in no acute distress; alert,appropriate and cooperative throughout examination pale.   Eyes:     pupils equal and pupils round.   Ears:     External ear exam shows no significant lesions or deformities.  Otoscopic examination reveals clear canals, tympanic membranes are intact bilaterally without bulging, retraction, inflammation or discharge. Hearing is grossly normal bilaterally. Mouth:     pharynx pink and moist.   Neck:     No deformities, masses, or tenderness noted. Lungs:     Normal respiratory effort, chest expands symmetrically. Lungs are clear to auscultation, no crackles or wheezes. Heart:     Normal rate and regular rhythm. S1 and S2 normal without gallop, murmur, click, rub or other extra sounds. Abdomen:     soft, non-tender, normal bowel sounds, and no distention.      Impression & Recommendations:  Problem # 1:  OSTEOPOROSIS (  ICD-733.00)  Her updated medication list for this problem includes:    Os-cal 500 + D 500-200 Mg-unit Tabs (Calcium carbonate-vitamin d) ..... Once daily    Boniva 150 Mg Tabs (Ibandronate sodium) ..... Every month    Vitamin D 04540 Unit Caps (Ergocalciferol) ..... One by mouth weekly  Orders: Venipuncture (98119) T-Vitamin D (25-Hydroxy) [phone])   Problem # 2:  HYPERTENSION (ICD-401.9)  Her updated medication list for this problem includes:    Micardis Hct 80-12.5 Mg Tabs (Telmisartan-hctz) ..... Once daily  BP today: 124/84 Prior BP: 122/88 (04/28/2008)  Prior 10 Yr Risk Heart Disease: Not enough information (09/25/2007)  Labs Reviewed: Creat: 0.8 (12/18/2006)   Problem # 3:  GERD (ICD-530.81)  Diagnostics Reviewed:  Discussed lifestyle modifications, diet,  antacids/medications, and preventive measures. Handout provided.   Complete Medication List: 1)  Os-cal 500 + D 500-200 Mg-unit Tabs (Calcium carbonate-vitamin d) .... Once daily 2)  Micardis Hct 80-12.5 Mg Tabs (Telmisartan-hctz) .... Once daily 3)  Boniva 150 Mg Tabs (Ibandronate sodium) .... Every month 4)  Requip 0.5 Mg Tabs (Ropinirole hcl) .... At bedtime 5)  Vitamin D 30865 Unit Caps (Ergocalciferol) .... One by mouth weekly    Prescriptions: VITAMIN D 78469 UNIT CAPS (ERGOCALCIFEROL) one by mouth weekly  #5 x 11   Entered and Authorized by:   Stacie Glaze MD   Signed by:   Stacie Glaze MD on 07/26/2008   Method used:   Electronically to        [facility] [address] [phone]* (retail)       [address]       [place], [place]  [address]       Ph: [phone] or [phone]       Fax: [phone]   RxID:   [id number]  ]

## 2011-01-03 NOTE — Assessment & Plan Note (Signed)
Summary: 2 MTH ROA // RS   Vital Signs:  Patient profile:   77 year old female Height:      67 inches Weight:      188 pounds BMI:     29.55 Temp:     98.2 degrees F oral Pulse rate:   72 / minute Resp:     14 per minute BP sitting:   130 / 80  (left arm)  Vitals Entered By: Willy Eddy, LPN (Apr 23, 2010 9:55 AM) CC: roa, Hypertension Management   CC:  roa and Hypertension Management.  History of Present Illness: the pt presents for  follow up edema is better still has pain in the left shoulder and in the ankles bilaterally she states that she tries to walk and exercize has tired to walk and exercize   Hypertension History:      She denies headache, chest pain, palpitations, dyspnea with exertion, orthopnea, PND, peripheral edema, visual symptoms, neurologic problems, syncope, and side effects from treatment.        Positive major cardiovascular risk factors include female age 66 years old or older and hypertension.  Negative major cardiovascular risk factors include negative family history for ischemic heart disease and non-tobacco-user status.     Preventive Screening-Counseling & Management  Alcohol-Tobacco     Smoking Status: quit     Year Quit: 2000     Passive Smoke Exposure: no  Problems Prior to Update: 1)  Chronic Obstructive Pulmonary Disease, Moderate  (ICD-496) 2)  Constipation, Chronic  (ICD-564.09) 3)  Obst Chronic Bronchitis W/acute Bronchitis  (ICD-491.22) 4)  Anemia-iron Deficiency  (ICD-280.9) 5)  Uti's, Hx of  (ICD-V13.00) 6)  Low Back Pain  (ICD-724.2) 7)  Family History Breast Cancer 1st Degree Relative <50  (ICD-V16.3) 8)  Preventive Health Care  (ICD-V70.0) 9)  Family History of Cad Female 1st Degree Relative <50  (ICD-V17.3) 10)  Restless Leg Syndrome  (ICD-333.94) 11)  Osteoporosis  (ICD-733.00) 12)  Hypertension  (ICD-401.9) 13)  Gerd  (ICD-530.81)  Current Problems (verified): 1)  Chronic Obstructive Pulmonary Disease, Moderate   (ICD-496) 2)  Constipation, Chronic  (ICD-564.09) 3)  Obst Chronic Bronchitis W/acute Bronchitis  (ICD-491.22) 4)  Anemia-iron Deficiency  (ICD-280.9) 5)  Uti's, Hx of  (ICD-V13.00) 6)  Low Back Pain  (ICD-724.2) 7)  Family History Breast Cancer 1st Degree Relative <50  (ICD-V16.3) 8)  Preventive Health Care  (ICD-V70.0) 9)  Family History of Cad Female 1st Degree Relative <50  (ICD-V17.3) 10)  Restless Leg Syndrome  (ICD-333.94) 11)  Osteoporosis  (ICD-733.00) 12)  Hypertension  (ICD-401.9) 13)  Gerd  (ICD-530.81)  Medications Prior to Update: 1)  Os-Cal 500 + D 500-200 Mg-Unit  Tabs (Calcium Carbonate-Vitamin D) .... Once Daily 2)  Requip 0.5 Mg  Tabs (Ropinirole Hcl) .... At Bedtime 3)  Vitamin D 62130 Unit Caps (Ergocalciferol) .... One Twice A Week 4)  Align  Caps (Probiotic Product) .... One By Mouth Daily 5)  Benefiber  Powd (Wheat Dextrin) .... One Pack Twice A Day 6)  Spiriva Handihaler 18 Mcg Caps (Tiotropium Bromide Monohydrate) .... One Cap Inhailed Daily 7)  Dexilant 60 Mg Cpdr (Dexlansoprazole) .... One By Mouth Daily 8)  Tribenzor 40-10-12.5 Mg Tabs (Olmesartan-Amlodipine-Hctz) .... One By Mouth Daily  Current Medications (verified): 1)  Os-Cal 500 + D 500-200 Mg-Unit  Tabs (Calcium Carbonate-Vitamin D) .... Once Daily 2)  Requip 0.5 Mg  Tabs (Ropinirole Hcl) .... At Bedtime 3)  Vitamin D 86578 Unit Caps (  Ergocalciferol) .... One Twice A Week 4)  Align  Caps (Probiotic Product) .... One By Mouth Daily 5)  Benefiber  Powd (Wheat Dextrin) .... One Pack Twice A Day 6)  Spiriva Handihaler 18 Mcg Caps (Tiotropium Bromide Monohydrate) .... One Cap Inhailed Daily 7)  Dexilant 60 Mg Cpdr (Dexlansoprazole) .... One By Mouth Daily 8)  Tribenzor 40-10-12.5 Mg Tabs (Olmesartan-Amlodipine-Hctz) .... One By Mouth Daily  Allergies (verified): 1)  ! Codeine  Past History:  Family History: Last updated: 2007-11-11 father died from MI at 92 Family History of CAD Female 1st degree  relative <50 mother died at 50  Social History: Last updated: November 11, 2007 Married Former Smoker  Risk Factors: Caffeine Use: 1 (09/25/2007)  Risk Factors: Smoking Status: quit (04/23/2010) Passive Smoke Exposure: no (04/23/2010)  Past medical, surgical, family and social histories (including risk factors) reviewed, and no changes noted (except as noted below).  Past Medical History: Reviewed history from November 11, 2007 and no changes required. Hyperthyroidism restlless legs Osteoporosis constipation Anemia-iron deficiency  Past Surgical History: Reviewed history from 03/01/2008 and no changes required. Appendectomy Hysterectomy Tonsillectomy Cataract extraction  Family History: Reviewed history from 2007/11/11 and no changes required. father died from MI at 73 Family History of CAD Female 1st degree relative <50 mother died at 66  Social History: Reviewed history from Nov 11, 2007 and no changes required. Married Former Smoker  Review of Systems  The patient denies anorexia, fever, weight loss, weight gain, vision loss, decreased hearing, hoarseness, chest pain, syncope, dyspnea on exertion, peripheral edema, prolonged cough, headaches, hemoptysis, abdominal pain, melena, hematochezia, severe indigestion/heartburn, hematuria, incontinence, genital sores, muscle weakness, suspicious skin lesions, transient blindness, difficulty walking, depression, unusual weight change, abnormal bleeding, enlarged lymph nodes, angioedema, and breast masses.    Physical Exam  General:  alert and pale.   Head:  normocephalic and no abnormalities palpated.   Eyes:  pupils equal and pupils round.   Ears:  R ear normal and L ear normal.   Mouth:  pharynx pink and moist.   Neck:  No deformities, masses, or tenderness noted. Lungs:  normal respiratory effort and no wheezes.   Heart:  normal rate and regular rhythm.   Abdomen:  soft, non-tender, no masses, and distended.   Msk:  No deformity  or scoliosis noted of thoracic or lumbar spine.   Pulses:  R and L carotid,radial,femoral,dorsalis pedis and posterior tibial pulses are full and equal bilaterally Extremities:  No clubbing, cyanosis, edema, or deformity noted with normal full range of motion of all joints.   Neurologic:  alert & oriented X3 and finger-to-nose normal.     Impression & Recommendations:  Problem # 1:  ANEMIA-IRON DEFICIENCY (ICD-280.9) monitering no signes of recurrant anemia Hgb: 12.3 (01/08/2010)   Hct: 37.7 (01/08/2010)   Platelets: 265.0 (01/08/2010) RBC: 4.20 (01/08/2010)   RDW: 12.3 (01/08/2010)   WBC: 8.2 (01/08/2010) MCV: 89.7 (01/08/2010)   MCHC: 32.6 (01/08/2010) Iron: 69 (01/08/2010)   % Sat: 17.6 (01/08/2010) TSH: 1.10 (09/20/2009)  Problem # 2:  LOW BACK PAIN (ICD-724.2)  persistant pain   Discussed use of moist heat or ice, modified activities, medications, and stretching/strengthening exercises. Back care instructions given. To be seen in 2 weeks if no improvement; sooner if worsening of symptoms.   Her updated medication list for this problem includes:    Meloxicam 15 Mg Tabs (Meloxicam) ..... One by mouth daily  Orders: Prescription Created Electronically [phone])  Problem # 3:  HYPERTENSION (ICD-401.9)  Her updated medication list for this problem  includes:    Tribenzor 40-10-12.5 Mg Tabs (Olmesartan-amlodipine-hctz) ..... One by mouth daily  BP today: 130/80 Prior BP: 136/76 (02/19/2010)  Prior 10 Yr Risk Heart Disease: Not enough information (09/25/2007)  Labs Reviewed: K+: 4.1 (02/22/2009) Creat: : 0.6 (02/22/2009)     Problem # 4:  UTI'S, HX OF (ICD-V13.00) no current symptoms  Problem # 5:  CONSTIPATION, CHRONIC (ICD-564.09) stable Her updated medication list for this problem includes:    Benefiber Powd (Wheat dextrin) ..... One pack twice a day  Complete Medication List: 1)  Os-cal 500 + D 500-200 Mg-unit Tabs (Calcium carbonate-vitamin d) .... Once daily 2)   Requip 0.5 Mg Tabs (Ropinirole hcl) .... At bedtime 3)  Vitamin D 16109 Unit Caps (Ergocalciferol) .... One twice a week 4)  Align Caps (Probiotic product) .... One by mouth daily 5)  Benefiber Powd (Wheat dextrin) .... One pack twice a day 6)  Spiriva Handihaler 18 Mcg Caps (Tiotropium bromide monohydrate) .... One cap inhailed daily 7)  Dexilant 60 Mg Cpdr (Dexlansoprazole) .... One by mouth daily 8)  Tribenzor 40-10-12.5 Mg Tabs (Olmesartan-amlodipine-hctz) .... One by mouth daily 9)  Meloxicam 15 Mg Tabs (Meloxicam) .... One by mouth daily  Hypertension Assessment/Plan:      The patient's hypertensive risk group is category B: At least one risk factor (excluding diabetes) with no target organ damage.  Today's blood pressure is 130/80.  Her blood pressure goal is < 140/90.  Patient Instructions: 1)  Please schedule a follow-up appointment in 3 months. Prescriptions: MELOXICAM 15 MG TABS (MELOXICAM) one by mouth daily  #30 x 11   Entered and Authorized by:   Stacie Glaze MD   Signed by:   Stacie Glaze MD on 04/23/2010   Method used:   [facility]        [facility] [address] [phone]* (retail)       [address]       [place], [place]  [address]       Ph: [phone] or [phone]       Fax: [phone]   RxID:   [id number]

## 2011-01-03 NOTE — Assessment & Plan Note (Signed)
Summary: 3 month rov/njr   Vital Signs:  Patient profile:   77 year old female Height:      67 inches Weight:      190 pounds BMI:     29.87 Temp:     98.2 degrees F oral Pulse rate:   76 / minute Resp:     14 per minute BP sitting:   136 / 84  (left arm)  Vitals Entered By: Willy Eddy, LPN (July 16, 2010 9:17 AM) CC: roa Is Patient Diabetic? No   CC:  roa.  History of Present Illness: has no energy and has an increasing amount of leg and shoulder pain when she walks her hips feel like they are " coming out of the socket"   she has a hx of a fracture to the right foot/ ankle she has mild swelling in feet that completely goes away she has moderate post pradial bloating she denies thagt she is taling any aditionaly nsaids other than the meloxicam SOB is a major concern for her endurance issues no chest pain or diaphoresis reported the inhailer ( spiriva ) helps byut still wheezes  Preventive Screening-Counseling & Management  Alcohol-Tobacco     Smoking Status: quit     Year Quit: 2000     Passive Smoke Exposure: no  Problems Prior to Update: 1)  Chronic Obstructive Pulmonary Disease, Moderate  (ICD-496) 2)  Constipation, Chronic  (ICD-564.09) 3)  Obst Chronic Bronchitis W/acute Bronchitis  (ICD-491.22) 4)  Anemia-iron Deficiency  (ICD-280.9) 5)  Uti's, Hx of  (ICD-V13.00) 6)  Low Back Pain  (ICD-724.2) 7)  Family History Breast Cancer 1st Degree Relative <50  (ICD-V16.3) 8)  Preventive Health Care  (ICD-V70.0) 9)  Family History of Cad Female 1st Degree Relative <50  (ICD-V17.3) 10)  Restless Leg Syndrome  (ICD-333.94) 11)  Osteoporosis  (ICD-733.00) 12)  Hypertension  (ICD-401.9) 13)  Gerd  (ICD-530.81)  Current Problems (verified): 1)  Chronic Obstructive Pulmonary Disease, Moderate  (ICD-496) 2)  Constipation, Chronic  (ICD-564.09) 3)  Obst Chronic Bronchitis W/acute Bronchitis  (ICD-491.22) 4)  Anemia-iron Deficiency  (ICD-280.9) 5)  Uti's, Hx of   (ICD-V13.00) 6)  Low Back Pain  (ICD-724.2) 7)  Family History Breast Cancer 1st Degree Relative <50  (ICD-V16.3) 8)  Preventive Health Care  (ICD-V70.0) 9)  Family History of Cad Female 1st Degree Relative <50  (ICD-V17.3) 10)  Restless Leg Syndrome  (ICD-333.94) 11)  Osteoporosis  (ICD-733.00) 12)  Hypertension  (ICD-401.9) 13)  Gerd  (ICD-530.81)  Medications Prior to Update: 1)  Os-Cal 500 + D 500-200 Mg-Unit  Tabs (Calcium Carbonate-Vitamin D) .... Once Daily 2)  Requip 0.5 Mg  Tabs (Ropinirole Hcl) .... At Bedtime 3)  Vitamin D 16109 Unit Caps (Ergocalciferol) .... One Twice A Week 4)  Align  Caps (Probiotic Product) .... One By Mouth Daily 5)  Benefiber  Powd (Wheat Dextrin) .... One Pack Twice A Day 6)  Spiriva Handihaler 18 Mcg Caps (Tiotropium Bromide Monohydrate) .... One Cap Inhailed Daily 7)  Dexilant 60 Mg Cpdr (Dexlansoprazole) .... One By Mouth Daily 8)  Tribenzor 40-10-12.5 Mg Tabs (Olmesartan-Amlodipine-Hctz) .... One By Mouth Daily 9)  Meloxicam 15 Mg Tabs (Meloxicam) .... One By Mouth Daily  Current Medications (verified): 1)  Os-Cal 500 + D 500-200 Mg-Unit  Tabs (Calcium Carbonate-Vitamin D) .... Once Daily 2)  Requip 0.5 Mg  Tabs (Ropinirole Hcl) .... At Bedtime 3)  Vitamin D 60454 Unit Caps (Ergocalciferol) .... One Twice A Week 4)  Align  Caps (Probiotic Product) .... One By Mouth Daily 5)  Benefiber  Powd (Wheat Dextrin) .... One Pack Twice A Day 6)  Dulera 100-5 Mcg/act Aero (Mometasone Furo-Formoterol Fum) .... Two Puff Two Times A Day 7)  Dexilant 60 Mg Cpdr (Dexlansoprazole) .... One By Mouth Daily 8)  Tribenzor 40-10-12.5 Mg Tabs (Olmesartan-Amlodipine-Hctz) .... One By Mouth Daily 9)  Meloxicam 15 Mg Tabs (Meloxicam) .... One By Mouth Daily  Allergies (verified): 1)  ! Codeine  Past History:  Family History: Last updated: 11-24-07 father died from MI at 43 Family History of CAD Female 1st degree relative <50 mother died at 6  Social  History: Last updated: 2007/11/24 Married Former Smoker  Risk Factors: Caffeine Use: 1 (09/25/2007)  Risk Factors: Smoking Status: quit (07/16/2010) Passive Smoke Exposure: no (07/16/2010)  Past medical, surgical, family and social histories (including risk factors) reviewed, and no changes noted (except as noted below).  Past Medical History: Reviewed history from 11/24/2007 and no changes required. Hyperthyroidism restlless legs Osteoporosis constipation Anemia-iron deficiency  Past Surgical History: Reviewed history from 03/01/2008 and no changes required. Appendectomy Hysterectomy Tonsillectomy Cataract extraction  Family History: Reviewed history from 24-Nov-2007 and no changes required. father died from MI at 30 Family History of CAD Female 1st degree relative <50 mother died at 76  Social History: Reviewed history from 2007/11/24 and no changes required. Married Former Smoker  Review of Systems       The patient complains of decreased hearing, dyspnea on exertion, peripheral edema, and abdominal pain.  The patient denies anorexia, fever, weight loss, weight gain, vision loss, hoarseness, chest pain, syncope, prolonged cough, headaches, hemoptysis, melena, hematochezia, severe indigestion/heartburn, hematuria, incontinence, genital sores, muscle weakness, suspicious skin lesions, transient blindness, difficulty walking, depression, unusual weight change, abnormal bleeding, enlarged lymph nodes, angioedema, and breast masses.    Physical Exam  General:  alert and pale.   Head:  normocephalic and no abnormalities palpated.   Eyes:  pupils equal and pupils round.   Ears:  R ear normal and L ear normal.   Mouth:  pharynx pink and moist.   Neck:  No deformities, masses, or tenderness noted. Lungs:  normal respiratory effort and no wheezes.   Heart:  normal rate and regular rhythm.   Abdomen:  soft and non-tender.   Msk:  joint tenderness and joint swelling.    Pulses:  R and L carotid,radial,femoral,dorsalis pedis and posterior tibial pulses are full and equal bilaterally Extremities:  trace left pedal edema and trace right pedal edema.   Neurologic:  alert & oriented X3 and abnormal gait.     Impression & Recommendations:  Problem # 1:  CHRONIC OBSTRUCTIVE PULMONARY DISEASE, MODERATE (ICD-496) Assessment Unchanged  Her updated medication list for this problem includes:    Dulera 100-5 Mcg/act Aero (Mometasone furo-formoterol fum) .Marland Kitchen..Marland Kitchen Two puff two times a day  Vaccines Reviewed: Pneumovax: Historical (12/02/2005)   Flu Vax: Fluvax 3+ (09/20/2009)  Problem # 2:  HYPERTENSION (ICD-401.9) Assessment: Unchanged  Her updated medication list for this problem includes:    Tribenzor 40-10-12.5 Mg Tabs (Olmesartan-amlodipine-hctz) ..... One by mouth daily  BP today: 136/84 Prior BP: 130/80 (04/23/2010)  Prior 10 Yr Risk Heart Disease: Not enough information (09/25/2007)  Labs Reviewed: K+: 4.1 (02/22/2009) Creat: : 0.6 (02/22/2009)     Problem # 3:  GERD (ICD-530.81) Assessment: Unchanged has bloating due to GERD consider GI referral Her updated medication list for this problem includes:    Dexilant 60 Mg Cpdr (Dexlansoprazole) .Marland KitchenMarland KitchenMarland KitchenMarland Kitchen  One by mouth daily  Labs Reviewed: Hgb: 12.3 (01/08/2010)   Hct: 37.7 (01/08/2010)  Problem # 4:  HIP PAIN, BILATERAL (ICD-719.45) Assessment: Unchanged  Her updated medication list for this problem includes:    Meloxicam 15 Mg Tabs (Meloxicam) ..... One by mouth daily  Discussed use of medications, application of heat or cold, and exercises.   Orders: T-Bilateral Hip w/Pelvis, min 2 views (73520TC)  Complete Medication List: 1)  Os-cal 500 + D 500-200 Mg-unit Tabs (Calcium carbonate-vitamin d) .... Once daily 2)  Requip 0.5 Mg Tabs (Ropinirole hcl) .... At bedtime 3)  Vitamin D 04540 Unit Caps (Ergocalciferol) .... One twice a week 4)  Align Caps (Probiotic product) .... One by mouth  daily 5)  Benefiber Powd (Wheat dextrin) .... One pack twice a day 6)  Dulera 100-5 Mcg/act Aero (Mometasone furo-formoterol fum) .... Two puff two times a day 7)  Dexilant 60 Mg Cpdr (Dexlansoprazole) .... One by mouth daily 8)  Tribenzor 40-10-12.5 Mg Tabs (Olmesartan-amlodipine-hctz) .... One by mouth daily 9)  Meloxicam 15 Mg Tabs (Meloxicam) .... One by mouth daily  Other Orders: Venipuncture (98119) TLB-CBC Platelet - w/Differential (85025-CBCD) TLB-IBC Pnl (Iron/FE;Transferrin) (83550-IBC)  Patient Instructions: 1)  Please schedule a follow-up appointment in 3 months.  Appended Document: Orders Update    Clinical Lists Changes  Orders: Added new Service order of Specimen Handling ([id number]) - Signed

## 2011-01-03 NOTE — Assessment & Plan Note (Signed)
Medications Added PERCOCET 10-650 MG  TABS (OXYCODONE-ACETAMINOPHEN) one by mouth q 6 hours prn        Vital Signs:  Patient Profile:   77 Years Old Female Weight:      166 pounds Temp:     98.2 degrees F oral BP sitting:   122 / 82  Vitals Entered By: Lynann Beaver CMA (November 09, 2007 2:51 PM)                 Chief Complaint:  Post hosp check.  Current Allergies (reviewed today): ! CODEINE Updated/Current Medications (including changes made in today's visit):  OS-CAL 500 + D 500-200 MG-UNIT  TABS (CALCIUM CARBONATE-VITAMIN D) once daily MICARDIS HCT 80-12.5 MG  TABS (TELMISARTAN-HCTZ) once daily BONIVA 150 MG  TABS (IBANDRONATE SODIUM) every month REQUIP 0.5 MG  TABS (ROPINIROLE HCL) at bedtime CELEBREX 200 MG  CAPS (CELECOXIB) one by mouth daily PERCOCET 10-650 MG  TABS (OXYCODONE-ACETAMINOPHEN) one by mouth q 6 hours prn   Past Medical History:    Reviewed history and no changes required:       Hyperthyroidism       restlless legs       Osteoporosis       constipation       Anemia-iron deficiency  Past Surgical History:    Reviewed history and no changes required:       Appendectomy       Hysterectomy       Tonsillectomy   Family History:    Reviewed history and no changes required:       father died from MI at 11       Family History of CAD Female 1st degree relative <50       mother died at 63  Social History:    Reviewed history and no changes required:       Married       Former Smoker   Risk Factors:  Tobacco use:  quit   Review of Systems  The patient denies fever, vision loss, decreased hearing, chest pain, dyspnea on exhertion, prolonged cough, hemoptysis, abdominal pain, hematochezia, severe indigestion/heartburn, and hematuria.     Physical Exam  General:     Well-developed,well-nourished,in no acute distress; alert,appropriate and cooperative throughout examination pale.   Head:     normocephalic.   Eyes:     pupils equal  and pupils round.   Ears:     R ear normal and L ear normal.   Mouth:     pharynx pink and moist.   Neck:     No deformities, masses, or tenderness noted. Lungs:     Normal respiratory effort, chest expands symmetrically. Lungs are clear to auscultation, no crackles or wheezes. Heart:     Normal rate and regular rhythm. S1 and S2 normal without gallop, murmur, click, rub or other extra sounds. Msk:     lumbar lordosis and SI joint tenderness.   Pulses:     R and L carotid,radial,femoral,dorsalis pedis and posterior tibial pulses are full and equal bilaterally Extremities:     trace left pedal edema and trace right pedal edema.      Impression & Recommendations:  Problem # 1:  HYPERTENSION (ICD-401.9) resume her prior medications Her updated medication list for this problem includes:    Micardis Hct 80-12.5 Mg Tabs (Telmisartan-hctz) ..... Once daily  BP today: 122/82  Labs Reviewed: Creat: 0.8 (12/18/2006)   Problem # 2:  RESTLESS LEG SYNDROME (ICD-333.94)  continue requip  Problem # 3:  LOW BACK PAIN (ICD-724.2) Assessment: Improved frature to sacrum Her updated medication list for this problem includes:    Celebrex 200 Mg Caps (Celecoxib) ..... One by mouth daily    Percocet 10-650 Mg Tabs (Oxycodone-acetaminophen) ..... One by mouth q 6 hours prn  Orders: T-Culture, Urine ([id number])   Problem # 4:  OSTEOPOROSIS (ICD-733.00) Assessment: Unchanged  Her updated medication list for this problem includes:    Os-cal 500 + D 500-200 Mg-unit Tabs (Calcium carbonate-vitamin d) .Marland KitchenMarland Kitchen..twice daily    Boniva 150 Mg Tabs (Ibandronate sodium) ..... Every month  Her updated medication list for this problem includes:    Os-cal 500 + D 500-200 Mg-unit Tabs (Calcium carbonate-vitamin d) ..... Once daily    Boniva 150 Mg Tabs (Ibandronate sodium) ..... Every month   Problem # 5:  UTI'S, HX OF (ICD-V13.00)  Orders: T-Culture, Urine ([id number])   Problem # 6:   ANEMIA-IRON DEFICIENCY (ICD-280.9) Hgb: 11.7 (12/18/2006)   Hct: 36.5 (12/18/2006)   RDW: 12.8 (12/18/2006)   MCV: 88.9 (12/18/2006)   MCHC: 32.2 (12/18/2006) TSH: 2.07 (12/18/2006)  Orders: TLB-CBC Platelet - w/Differential (85025-CBCD) TLB-Iron, (Fe) Total (83540-FE)   Complete Medication List: 1)  Os-cal 500 + D 500-200 Mg-unit Tabs (Calcium carbonate-vitamin d) .... Once daily 2)  Micardis Hct 80-12.5 Mg Tabs (Telmisartan-hctz) .... Once daily 3)  Boniva 150 Mg Tabs (Ibandronate sodium) .... Every month 4)  Requip 0.5 Mg Tabs (Ropinirole hcl) .... At bedtime 5)  Celebrex 200 Mg Caps (Celecoxib) .... One by mouth daily 6)  Percocet 10-650 Mg Tabs (Oxycodone-acetaminophen) .... One by mouth q 6 hours prn   Patient Instructions: 1)  keep Jan appointment    Prescriptions: PERCOCET 10-650 MG  TABS (OXYCODONE-ACETAMINOPHEN) one by mouth q 6 hours prn  #100 x 0   Entered and Authorized by:   Stacie Glaze MD   Signed by:   Stacie Glaze MD on 11/09/2007   Method used:   Print then Give to Patient   RxID:   [id number]  ]  Appended Document:

## 2011-01-03 NOTE — Assessment & Plan Note (Signed)
Summary: 3 month rov/njr   Vital Signs:  Patient profile:   77 year old female Height:      67 inches Weight:      178 pounds BMI:     27.98 Temp:     98.2 degrees F rectal Pulse rate:   80 / minute Resp:     14 per minute BP sitting:   170 / 96  (left arm)  Vitals Entered By: Willy Eddy, LPN (May 24, 2009 9:04 AM)  CC:  roa- states bp is up alot now because working with husband is "driving her crazy".  History of Present Illness: Very stressed over care issues Husband will not let her out of the room very aggitated with his "stupid questions" she does not have a clear idea on how to control his demands she gets frustrated  ( she needs to control  the conversation) simple one work answers  Problems Prior to Update: 1)  Anemia-iron Deficiency  (ICD-280.9) 2)  Uti's, Hx of  (ICD-V13.00) 3)  Low Back Pain  (ICD-724.2) 4)  Family History Breast Cancer 1st Degree Relative <50  (ICD-V16.3) 5)  Preventive Health Care  (ICD-V70.0) 6)  Family History of Cad Female 1st Degree Relative <50  (ICD-V17.3) 7)  Restless Leg Syndrome  (ICD-333.94) 8)  Osteoporosis  (ICD-733.00) 9)  Hypertension  (ICD-401.9) 10)  Gerd  (ICD-530.81)  Medications Prior to Update: 1)  Os-Cal 500 + D 500-200 Mg-Unit  Tabs (Calcium Carbonate-Vitamin D) .... Once Daily 2)  Micardis Hct 80-12.5 Mg  Tabs (Telmisartan-Hctz) .... Once Daily 3)  Boniva 150 Mg  Tabs (Ibandronate Sodium) .... Every Month 4)  Requip 0.5 Mg  Tabs (Ropinirole Hcl) .... At Bedtime 5)  Vitamin D 37628 Unit Caps (Ergocalciferol) .... One By Mouth Weekly  Current Medications (verified): 1)  Os-Cal 500 + D 500-200 Mg-Unit  Tabs (Calcium Carbonate-Vitamin D) .... Once Daily 2)  Micardis Hct 80-12.5 Mg  Tabs (Telmisartan-Hctz) .... Once Daily 3)  Boniva 150 Mg  Tabs (Ibandronate Sodium) .... Every Month 4)  Requip 0.5 Mg  Tabs (Ropinirole Hcl) .... At Bedtime 5)  Vitamin D 31517 Unit Caps (Ergocalciferol) .... One By Mouth  Weekly  Allergies (verified): 1)  ! Codeine  Past History:  Family History: Last updated: November 14, 2007 father died from MI at 74 Family History of CAD Female 1st degree relative <50 mother died at 48  Social History: Last updated: 11/14/2007 Married Former Smoker  Risk Factors: Caffeine Use: 1 (09/25/2007)  Risk Factors: Smoking Status: quit (11-14-07) Passive Smoke Exposure: no (09/25/2007)  Past medical, surgical, family and social histories (including risk factors) reviewed, and no changes noted (except as noted below).  Past Medical History: Reviewed history from 11/14/2007 and no changes required. Hyperthyroidism restlless legs Osteoporosis constipation Anemia-iron deficiency  Past Surgical History: Reviewed history from 03/01/2008 and no changes required. Appendectomy Hysterectomy Tonsillectomy Cataract extraction  Family History: Reviewed history from 14-Nov-2007 and no changes required. father died from MI at 71 Family History of CAD Female 1st degree relative <50 mother died at 71  Social History: Reviewed history from Nov 14, 2007 and no changes required. Married Former Smoker  Review of Systems  The patient denies anorexia, fever, weight loss, weight gain, vision loss, decreased hearing, hoarseness, chest pain, syncope, dyspnea on exertion, peripheral edema, prolonged cough, headaches, hemoptysis, abdominal pain, melena, hematochezia, severe indigestion/heartburn, hematuria, incontinence, genital sores, muscle weakness, suspicious skin lesions, transient blindness, difficulty walking, depression, unusual weight change, abnormal bleeding, enlarged lymph nodes, angioedema,  and breast masses.    Physical Exam  General:  Well-developed,well-nourished,in no acute distress; alert,appropriate and cooperative throughout examination pale.   Head:  normocephalic.   Eyes:  pupils equal and pupils round.   Ears:  R ear normal and L ear normal.   Mouth:  pharynx  pink and moist.   Neck:  No deformities, masses, or tenderness noted. Lungs:  Normal respiratory effort, chest expands symmetrically. Lungs are clear to auscultation, no crackles or wheezes. Heart:  Normal rate and regular rhythm. S1 and S2 normal without gallop, murmur, click, rub or other extra sounds. Abdomen:  soft, non-tender, normal bowel sounds, and no distention.   Msk:  normal ROM, no joint tenderness, lumbar lordosis, and SI joint tenderness.   Pulses:  R and L carotid,radial,femoral,dorsalis pedis and posterior tibial pulses are full and equal bilaterally Extremities:  No clubbing, cyanosis, edema, or deformity noted with normal full range of motion of all joints.   Neurologic:  alert & oriented X3.   Skin:  turgor normal and color normal.      Impression & Recommendations:  Problem # 1:  ANEMIA-IRON DEFICIENCY (ICD-280.9)  Hgb: 12.4 (10/24/2008)   Hct: 36.0 (10/24/2008)   Platelets: 268 (10/24/2008) RBC: 4.05 (10/24/2008)   RDW: 13.0 (10/24/2008)   WBC: 11.0 (10/24/2008) MCV: 88.9 (10/24/2008)   MCHC: 34.3 (10/24/2008) Iron: 61 (10/24/2008)   % Sat: 16.2 (10/24/2008) TSH: 2.07 (12/18/2006)  Problem # 2:  HYPERTENSION (ICD-401.9)  Her updated medication list for this problem includes:    Micardis Hct 80-12.5 Mg Tabs (Telmisartan-hctz) ..... Once daily  BP today: 170/96 Prior BP: 130/80 (02/22/2009)  Prior 10 Yr Risk Heart Disease: Not enough information (09/25/2007)  Labs Reviewed: K+: 4.1 (02/22/2009) Creat: : 0.6 (02/22/2009)     Problem # 3:  GERD (ICD-530.81) Assessment: Unchanged  Labs Reviewed: Hgb: 12.4 (10/24/2008)   Hct: 36.0 (10/24/2008)  Problem # 4:  LOW BACK PAIN (ICD-724.2) Assessment: Unchanged  Discussed use of moist heat or ice, modified activities, medications, and stretching/strengthening exercises. Back care instructions given. To be seen in 2 weeks if no improvement; sooner if worsening of symptoms.   Problem # 5:  OBST CHRONIC  BRONCHITIS W/ACUTE BRONCHITIS (ICD-491.22)  Complete Medication List: 1)  Os-cal 500 + D 500-200 Mg-unit Tabs (Calcium carbonate-vitamin d) .... Once daily 2)  Micardis Hct 80-12.5 Mg Tabs (Telmisartan-hctz) .... Once daily 3)  Boniva 150 Mg Tabs (Ibandronate sodium) .... Every month 4)  Requip 0.5 Mg Tabs (Ropinirole hcl) .... At bedtime 5)  Vitamin D 57846 Unit Caps (Ergocalciferol) .... One by mouth weekly 6)  Biaxin Xl 500 Mg Xr24h-tab (Clarithromycin) .... Generic one by mouth two times a day for 7 days 7)  Atuss Ds 30-4-30 Mg/81ml Susp (Pseudoephed hcl-cpm-dm hbr tan) .... Two tsp by mouth two times a day  Patient Instructions: 1)  Please schedule a follow-up appointment in 4 months. Prescriptions: ATUSS DS 30-4-30 MG/5ML SUSP (PSEUDOEPHED HCL-CPM-DM HBR TAN) two tsp by mouth two times a day  #6 oz x 0   Entered and Authorized by:   Stacie Glaze MD   Signed by:   Stacie Glaze MD on 05/24/2009   Method used:   Electronically to        [facility] [address] [phone]* (retail)       [address]       [place], [place]  [address]       Ph:  [phone] or [phone]       Fax: [phone]   RxID:   [id number] BIAXIN XL 500 MG XR24H-TAB (CLARITHROMYCIN) generic one by mouth two times a day for 7 days  #14 x 0   Entered and Authorized by:   Stacie Glaze MD   Signed by:   Stacie Glaze MD on 05/24/2009   Method used:   [facility] to        [facility] [address] [phone]* (retail)       [address]       [place], [place]  [address]       Ph: [phone] or [phone]       Fax: [phone]   RxID:   [id number]

## 2011-01-03 NOTE — Miscellaneous (Signed)
Summary: Immunization Entry/fli vaccine 08-12-08

## 2011-01-03 NOTE — Assessment & Plan Note (Signed)
Summary: 2 month rov/njr   Vital Signs:  Patient Profile:   77 Years Old Female Height:     67 inches Weight:      170 pounds Temp:     98.2 degrees F oral BP sitting:   122 / 88  (left arm)  Vitals Entered By: Willy Eddy, LPN (Apr 28, 2008 11:27 AM)                 Chief Complaint:  follow up meds.  History of Present Illness:  Follow-Up Visit      This is a 77 year old woman who presents for Follow-up visit.  pain shots helped from dr Noel Gerold ( see note).  The patient denies chest pain, palpitations, dizziness, syncope, low blood sugar symptoms, high blood sugar symptoms, edema, SOB, DOE, PND, and orthopnea.  Since the last visit the patient notes being seen by a specialist.  The patient reports taking meds as prescribed and monitoring BP.  When questioned about possible medication side effects, the patient notes sexual dysfunction and fatigue.      Current Allergies: ! CODEINE      Physical Exam  General:     Well-developed,well-nourished,in no acute distress; alert,appropriate and cooperative throughout examination pale.   Eyes:     pupils equal and pupils round.   Ears:     R ear normal and L ear normal.   Mouth:     pharynx pink and moist.   Neck:     No deformities, masses, or tenderness noted. Lungs:     Normal respiratory effort, chest expands symmetrically. Lungs are clear to auscultation, no crackles or wheezes. Heart:     Normal rate and regular rhythm. S1 and S2 normal without gallop, murmur, click, rub or other extra sounds. Abdomen:     soft, non-tender, normal bowel sounds, and no distention.   Msk:     normal ROM, no joint tenderness, lumbar lordosis, and SI joint tenderness.      Impression & Recommendations:  Problem # 1:  ANEMIA-IRON DEFICIENCY (ICD-280.9) Hgb: 12.9 (11/09/2007)   Hct: 38.2 (11/09/2007)   RDW: 12.8 (11/09/2007)   MCV: 86.7 (11/09/2007)   MCHC: 33.7 (11/09/2007) Iron: 52 (11/09/2007)   TSH: 2.07  (12/18/2006)  Orders: TLB-CBC Platelet - w/Differential (85025-CBCD) TLB-Iron, (Fe) Total (83540-FE)   Problem # 2:  HYPERTENSION (ICD-401.9)  Her updated medication list for this problem includes:    Micardis Hct 80-12.5 Mg Tabs (Telmisartan-hctz) ..... Once daily  BP today: 122/88 Prior BP: 130/80 (03/01/2008)  Prior 10 Yr Risk Heart Disease: Not enough information (09/25/2007)  Labs Reviewed: Creat: 0.8 (12/18/2006)   Problem # 3:  GERD (ICD-530.81)  Diagnostics Reviewed:  Discussed lifestyle modifications, diet, antacids/medications, and preventive measures. Handout provided.   Complete Medication List: 1)  Os-cal 500 + D 500-200 Mg-unit Tabs (Calcium carbonate-vitamin d) .... Once daily 2)  Micardis Hct 80-12.5 Mg Tabs (Telmisartan-hctz) .... Once daily 3)  Boniva 150 Mg Tabs (Ibandronate sodium) .... Every month 4)  Requip 0.5 Mg Tabs (Ropinirole hcl) .... At bedtime 5)  Percocet 10-650 Mg Tabs (Oxycodone-acetaminophen) .... One by mouth q 6 hours prn   Patient Instructions: 1)  Please schedule a follow-up appointment in 3 months.   ]

## 2011-01-03 NOTE — Consult Note (Signed)
Summary: Dr Noel Gerold note  Dr Noel Gerold note   Imported By: Kassie Mends 03/16/2008 14:57:10  _____________________________________________________________________  External Attachment:    Type:   Image     Comment:   Dr Noel Gerold note

## 2011-01-03 NOTE — Letter (Signed)
Summary: Results Letter  [facility]  [address] [place], [place] [address]   Phone: [phone]  Fax: [phone]        March 02, 2010 MRN: [id number]    [facility] [address] [place], [place]  [address]    Dear Ms. Rumsey,   The polyp(s) removed during your recent procedure were proven to be adenomatous.  These are pre-cancerous polyps that may have grown into cancers if they had not been removed.  Based on current nationally recognized surveillance guidelines, I recommend that you have a repeat colonoscopy in 3 years.   We will therefore put your information in our reminder system and will contact you in 3 years to schedule a repeat procedure.  Please call if you have any questions or concerns.       Sincerely,  Rachael Fee MD  This letter has been electronically signed by your physician.  Appended Document: Results Letter Letter mailed 4.5.11

## 2011-01-03 NOTE — Miscellaneous (Signed)
Summary: [facility]  Clinical Lists Changes  Medications: Added new medication of MOVIPREP 100 GM  SOLR (PEG-KCL-NACL-NASULF-NA ASC-C) As per prep instructions. - Signed Rx of MOVIPREP 100 GM  SOLR (PEG-KCL-NACL-NASULF-NA ASC-C) As per prep instructions.;  #1 x 0;  Signed;  Entered by: Ezra Sites RN;  Authorized by: Rachael Fee MD;  Method used: [facility] to [facility] [address] [phone]*, [address], [place], [place], [place]  [address], Ph: [phone] or [phone], Fax: [phone] Allergies: Changed allergy or adverse reaction from CODEINE to CODEINE    Prescriptions: MOVIPREP 100 GM  SOLR (PEG-KCL-NACL-NASULF-NA ASC-C) As per prep instructions.  #1 x 0   Entered by:   Ezra Sites RN   Authorized by:   Rachael Fee MD   Signed by:   Ezra Sites RN on 02/14/2010   Method used:   [facility]ronically to        [facility] [address] [phone]* (retail)       [address]       [place], [place]  [address]       Ph: [phone] or [phone]       Fax: [phone]   RxID:   [id number]

## 2011-01-03 NOTE — Assessment & Plan Note (Signed)
Summary: 3 month rov/njr   Vital Signs:  Patient Profile:   77 Years Old Female Height:     67 inches Weight:      172 pounds Temp:     98.5 degrees F oral Pulse rate:   80 / minute Resp:     14 per minute BP sitting:   156 / 82  (left arm)  Vitals Entered By: Willy Eddy, LPN (October 24, 2008 9:53 AM)                 Chief Complaint:  roa-husband broke hip and is inr rehab.  History of Present Illness: Neck pain with moderate radicular pain Follow-Up Visit      This is a 77 year old woman who presents for Follow-up visit.  The patient denies chest pain, palpitations, dizziness, syncope, low blood sugar symptoms, high blood sugar symptoms, edema, SOB, DOE, PND, and orthopnea.  Since the last visit the patient notes no new problems or concerns.  The patient reports taking meds as prescribed and monitoring BP.  When questioned about possible medication side effects, the patient notes none.      Prior Medication List:  OS-CAL 500 + D 500-200 MG-UNIT  TABS (CALCIUM CARBONATE-VITAMIN D) once daily MICARDIS HCT 80-12.5 MG  TABS (TELMISARTAN-HCTZ) once daily BONIVA 150 MG  TABS (IBANDRONATE SODIUM) every month REQUIP 0.5 MG  TABS (ROPINIROLE HCL) at bedtime VITAMIN D [address] UNIT CAPS (ERGOCALCIFEROL) one by mouth weekly   Current Allergies (reviewed today): ! CODEINE  Past Medical History:    Reviewed history from 11/09/2007 and no changes required:       Hyperthyroidism       restlless legs       Osteoporosis       constipation       Anemia-iron deficiency  Past Surgical History:    Reviewed history from 03/01/2008 and no changes required:       Appendectomy       Hysterectomy       Tonsillectomy       Cataract extraction   Family History:    Reviewed history from 11/09/2007 and no changes required:       father died from MI at 25       Family History of CAD Female 1st degree relative <50       mother died at 59  Social History:    Reviewed history from  11/09/2007 and no changes required:       Married       Former Smoker    Review of Systems  The patient denies anorexia, fever, weight loss, weight gain, vision loss, decreased hearing, hoarseness, chest pain, syncope, dyspnea on exertion, peripheral edema, prolonged cough, headaches, hemoptysis, abdominal pain, melena, hematochezia, severe indigestion/heartburn, hematuria, incontinence, genital sores, muscle weakness, suspicious skin lesions, transient blindness, difficulty walking, depression, unusual weight change, abnormal bleeding, enlarged lymph nodes, angioedema, breast masses, and testicular masses.     Physical Exam  General:     Well-developed,well-nourished,in no acute distress; alert,appropriate and cooperative throughout examination pale.   Eyes:     pupils equal and pupils round.   Ears:     External ear exam shows no significant lesions or deformities.  Otoscopic examination reveals clear canals, tympanic membranes are intact bilaterally without bulging, retraction, inflammation or discharge. Hearing is grossly normal bilaterally. Mouth:     pharynx pink and moist.   Neck:     No deformities, masses, or tenderness noted.  Lungs:     Normal respiratory effort, chest expands symmetrically. Lungs are clear to auscultation, no crackles or wheezes. Heart:     Normal rate and regular rhythm. S1 and S2 normal without gallop, murmur, click, rub or other extra sounds. Abdomen:     soft, non-tender, normal bowel sounds, and no distention.   Msk:     normal ROM, no joint tenderness, lumbar lordosis, and SI joint tenderness.   Skin:     turgor normal and color normal.   Psych:     Oriented X3 and memory intact for recent and remote.      Impression & Recommendations:  Problem # 1:  OSTEOPOROSIS (ICD-733.00) reveiwed meds and protocols Her updated medication list for this problem includes:    Os-cal 500 + D 500-200 Mg-unit Tabs (Calcium carbonate-vitamin d) ..... Once  daily    Boniva 150 Mg Tabs (Ibandronate sodium) ..... Every month    Vitamin D 29528 Unit Caps (Ergocalciferol) ..... One by mouth weekly   Problem # 2:  HYPERTENSION (ICD-401.9) Assessment: Deteriorated elevated blood pressure due to stress and missed doeses Her updated medication list for this problem includes:    Micardis Hct 80-12.5 Mg Tabs (Telmisartan-hctz) ..... Once daily  BP today: 156/82 Prior BP: 124/84 (07/26/2008)  Prior 10 Yr Risk Heart Disease: Not enough information (09/25/2007)  Labs Reviewed: Creat: 0.8 (12/18/2006)  Orders: TLB-BMP (Basic Metabolic Panel-BMET) (80048-METABOL)   Problem # 3:  GERD (ICD-530.81) Assessment: Unchanged  Diagnostics Reviewed:  Discussed lifestyle modifications, diet, antacids/medications, and preventive measures. Handout provided.   Problem # 4:  ANEMIA-IRON DEFICIENCY (ICD-280.9) Hgb: 12.4 (04/28/2008)   Hct: 37.9 (04/28/2008)   RDW: 12.7 (04/28/2008)   MCV: 89.7 (04/28/2008)   MCHC: 32.7 (04/28/2008) Iron: 80 (04/28/2008)   TSH: 2.07 (12/18/2006)  Orders: Venipuncture (413[address]) TLB-IBC Pnl (Iron/FE;Transferrin) (83550-IBC) TLB-CBC Platelet - w/Differential (85025-CBCD)   Problem # 5:  FAMILY HISTORY BREAST CANCER 1ST DEGREE RELATIVE <50 (ICD-V16.3) due mamogram  will refer to the breast center  Complete Medication List: 1)  Os-cal 500 + D 500-200 Mg-unit Tabs (Calcium carbonate-vitamin d) .... Once daily 2)  Micardis Hct 80-12.5 Mg Tabs (Telmisartan-hctz) .... Once daily 3)  Boniva 150 Mg Tabs (Ibandronate sodium) .... Every month 4)  Requip 0.5 Mg Tabs (Ropinirole hcl) .... At bedtime 5)  Vitamin D 40102 Unit Caps (Ergocalciferol) .... One by mouth weekly   Patient Instructions: 1)  Please schedule a follow-up appointment in 4 months.   ]

## 2011-01-03 NOTE — Assessment & Plan Note (Signed)
Summary: 2 month rov/nrj   Vital Signs:  Patient profile:   77 year old female Height:      67 inches Weight:      190 pounds BMI:     29.87 Temp:     98.2 degrees F oral Pulse rate:   76 / minute Resp:     14 per minute BP sitting:   154 / 80  (left arm)  Vitals Entered By: Willy Eddy, LPN (November 27, 2009 9:55 AM) CC: 3 month roa, Hypertension Management   CC:  3 month roa and Hypertension Management.  History of Present Illness: Has gained some weight due to the holidays she states that she cannot walk due to SOB has a hx of smoking and COPD she has increased GERD symptoms mptoms as well that may be complicating her breathing    Hypertension History:      She denies headache, chest pain, palpitations, dyspnea with exertion, orthopnea, PND, peripheral edema, visual symptoms, neurologic problems, syncope, and side effects from treatment.        Positive major cardiovascular risk factors include female age 53 years old or older and hypertension.  Negative major cardiovascular risk factors include negative family history for ischemic heart disease and non-tobacco-user status.     Preventive Screening-Counseling & Management  Alcohol-Tobacco     Smoking Status: quit  Problems Prior to Update: 1)  Chronic Obstructive Pulmonary Disease, Moderate  (ICD-496) 2)  Constipation, Chronic  (ICD-564.09) 3)  Obst Chronic Bronchitis W/acute Bronchitis  (ICD-491.22) 4)  Anemia-iron Deficiency  (ICD-280.9) 5)  Uti's, Hx of  (ICD-V13.00) 6)  Low Back Pain  (ICD-724.2) 7)  Family History Breast Cancer 1st Degree Relative <50  (ICD-V16.3) 8)  Preventive Health Care  (ICD-V70.0) 9)  Family History of Cad Female 1st Degree Relative <50  (ICD-V17.3) 10)  Restless Leg Syndrome  (ICD-333.94) 11)  Osteoporosis  (ICD-733.00) 12)  Hypertension  (ICD-401.9) 13)  Gerd  (ICD-530.81)  Medications Prior to Update: 1)  Os-Cal 500 + D 500-200 Mg-Unit  Tabs (Calcium Carbonate-Vitamin D)  .... Once Daily 2)  Micardis Hct 80-12.5 Mg  Tabs (Telmisartan-Hctz) .... Once Daily 3)  Boniva 150 Mg  Tabs (Ibandronate Sodium) .... Every Month 4)  Requip 0.5 Mg  Tabs (Ropinirole Hcl) .... At Bedtime 5)  Vitamin D 04540 Unit Caps (Ergocalciferol) .... One By Mouth Weekly 6)  Align  Caps (Probiotic Product) .... One By Mouth Daily 7)  Benefiber  Powd (Wheat Dextrin) .... One Pack Twice A Day 8)  Spiriva Handihaler 18 Mcg Caps (Tiotropium Bromide Monohydrate) .... One Cap Inhailed Daily  Current Medications (verified): 1)  Os-Cal 500 + D 500-200 Mg-Unit  Tabs (Calcium Carbonate-Vitamin D) .... Once Daily 2)  Tribenzor 40-5-12.5 Mg Tabs (Olmesartan-Amlodipine-Hctz) .... One By Mouth Daily 3)  Boniva 150 Mg  Tabs (Ibandronate Sodium) .... Every Month  ( One Hold Due To Gi Complaints) 4)  Requip 0.5 Mg  Tabs (Ropinirole Hcl) .... At Bedtime 5)  Vitamin D 98119 Unit Caps (Ergocalciferol) .... One By Mouth Weekly 6)  Align  Caps (Probiotic Product) .... One By Mouth Daily 7)  Benefiber  Powd (Wheat Dextrin) .... One Pack Twice A Day 8)  Spiriva Handihaler 18 Mcg Caps (Tiotropium Bromide Monohydrate) .... One Cap Inhailed Daily 9)  Aciphex 20 Mg Tbec (Rabeprazole Sodium) .... One By Mouth Daily ( Samples)  Allergies (verified): 1)  ! Codeine  Past History:  Family History: Last updated: November 24, 2007 father died from MI  at 49 Family History of CAD Female 1st degree relative <50 mother died at 67  Social History: Last updated: 11/09/2007 Married Former Smoker  Risk Factors: Caffeine Use: 1 (09/25/2007)  Risk Factors: Smoking Status: quit (11/27/2009) Passive Smoke Exposure: no (09/25/2007)  Past medical, surgical, family and social histories (including risk factors) reviewed, and no changes noted (except as noted below).  Past Medical History: Reviewed history from 11/09/2007 and no changes required. Hyperthyroidism restlless legs Osteoporosis constipation Anemia-iron  deficiency  Past Surgical History: Reviewed history from 03/01/2008 and no changes required. Appendectomy Hysterectomy Tonsillectomy Cataract extraction  Family History: Reviewed history from 11/09/2007 and no changes required. father died from MI at 34 Family History of CAD Female 1st degree relative <50 mother died at 25  Social History: Reviewed history from 11/09/2007 and no changes required. Married Former Smoker  Review of Systems       The patient complains of hoarseness and dyspnea on exertion.  The patient denies anorexia, fever, weight loss, weight gain, vision loss, decreased hearing, syncope, peripheral edema, prolonged cough, headaches, hemoptysis, abdominal pain, melena, hematochezia, severe indigestion/heartburn, hematuria, incontinence, genital sores, muscle weakness, suspicious skin lesions, transient blindness, difficulty walking, depression, unusual weight change, abnormal bleeding, enlarged lymph nodes, angioedema, and breast masses.     Impression & Recommendations:  Problem # 1:  CHRONIC OBSTRUCTIVE PULMONARY DISEASE, MODERATE (ICD-496)  Her updated medication list for this problem includes:    Spiriva Handihaler 18 Mcg Caps (Tiotropium bromide monohydrate) ..... One cap inhailed daily  Vaccines Reviewed: Pneumovax: Historical (12/02/2005)   Flu Vax: Fluvax 3+ (09/20/2009)  Problem # 2:  HYPERTENSION (ICD-401.9) blood pressur eis increased no edema or symptoms of CHF Her updated medication list for this problem includes:    Tribenzor 40-5-12.5 Mg Tabs (Olmesartan-amlodipine-hctz) ..... One by mouth daily  BP today: 154/80 Prior BP: 144/90 (09/20/2009)  Prior 10 Yr Risk Heart Disease: Not enough information (09/25/2007)  Labs Reviewed: K+: 4.1 (02/22/2009) Creat: : 0.6 (02/22/2009)     Problem # 3:  GERD (ICD-530.81)  currently taking OTC meds ( mostly malox without success) Labs Reviewed: Hgb: 12.3 (09/20/2009)   Hct: 37.1 (09/20/2009)  Her  updated medication list for this problem includes:    Aciphex 20 Mg Tbec (Rabeprazole sodium) ..... One by mouth daily ( samples)  Problem # 4:  CONSTIPATION, CHRONIC (ICD-564.09)  still needs a laxitive every three days Her updated medication list for this problem includes:    Benefiber Powd (Wheat dextrin) ..... One pack twice a day  Discussed dietary fiber measures and increased water intake.   Complete Medication List: 1)  Os-cal 500 + D 500-200 Mg-unit Tabs (Calcium carbonate-vitamin d) .... Once daily 2)  Tribenzor 40-5-12.5 Mg Tabs (Olmesartan-amlodipine-hctz) .... One by mouth daily 3)  Boniva 150 Mg Tabs (Ibandronate sodium) .... Every month  ( one hold due to gi complaints) 4)  Requip 0.5 Mg Tabs (Ropinirole hcl) .... At bedtime 5)  Vitamin D [address] Unit Caps (Ergocalciferol) .... One by mouth weekly 6)  Align Caps (Probiotic product) .... One by mouth daily 7)  Benefiber Powd (Wheat dextrin) .... One pack twice a day 8)  Spiriva Handihaler 18 Mcg Caps (Tiotropium bromide monohydrate) .... One cap inhailed daily 9)  Aciphex 20 Mg Tbec (Rabeprazole sodium) .... One by mouth daily ( samples)  Hypertension Assessment/Plan:      The patient's hypertensive risk group is category B: At least one risk factor (excluding diabetes) with no target organ damage.  Today's blood  pressure is 154/80.  Her blood pressure goal is < 140/90.  Patient Instructions: 1)  Please schedule a follow-up appointment in 6 weeks.

## 2011-01-03 NOTE — Assessment & Plan Note (Signed)
Summary: 2 mo follow up   Vital Signs:  Patient Profile:   77 Years Old Female Height:     67 inches Weight:      166 pounds Temp:     98.2 degrees F oral Pulse rate:   76 / minute Resp:     14 per minute BP sitting:   130 / 80  (left arm)  Vitals Entered By: Willy Eddy, LPN (March 01, 2008 9:50 AM)                 Chief Complaint:  roa.  History of Present Illness: Current Problems:  had falls with fx rib but was due to slipping for foot not dizzy. Balance is off.  ANEMIA-IRON DEFICIENCY (ICD-280.9) LOW BACK PAIN (ICD-724.2)   better post cortisone shots RESTLESS LEG SYNDROME (ICD-333.94)   severe OSTEOPOROSIS (ICD-733.00) HYPERTENSION (ICD-401.9) stable GERD (ICD-530.81) heart burn varies with diet      Current Allergies: ! CODEINE  Past Medical History:    Reviewed history from 11/09/2007 and no changes required:       Hyperthyroidism       restlless legs       Osteoporosis       constipation       Anemia-iron deficiency  Past Surgical History:    Reviewed history from 11/09/2007 and no changes required:       Appendectomy       Hysterectomy       Tonsillectomy       Cataract extraction   Family History:    Reviewed history from 11/09/2007 and no changes required:       father died from MI at 31       Family History of CAD Female 1st degree relative <50       mother died at 49  Social History:    Reviewed history from 11/09/2007 and no changes required:       Married       Former Smoker    Review of Systems  The patient denies anorexia, fever, weight loss, weight gain, vision loss, decreased hearing, hoarseness, chest pain, syncope, dyspnea on exhertion, peripheral edema, prolonged cough, hemoptysis, abdominal pain, melena, hematochezia, severe indigestion/heartburn, hematuria, incontinence, genital sores, muscle weakness, suspicious skin lesions, transient blindness, abnormal bleeding, enlarged lymph nodes, angioedema, breast masses, and  testicular masses.     Physical Exam  General:     Well-developed,well-nourished,in no acute distress; alert,appropriate and cooperative throughout examination pale.   Head:     normocephalic.   Eyes:     pupils equal and pupils round.   Mouth:     pharynx pink and moist.   Neck:     No deformities, masses, or tenderness noted. Lungs:     Normal respiratory effort, chest expands symmetrically. Lungs are clear to auscultation, no crackles or wheezes. Heart:     Normal rate and regular rhythm. S1 and S2 normal without gallop, murmur, click, rub or other extra sounds. Abdomen:     soft, non-tender, normal bowel sounds, and no distention.   Msk:     normal ROM, no joint tenderness, lumbar lordosis, and SI joint tenderness.   Pulses:     R and L carotid,radial,femoral,dorsalis pedis and posterior tibial pulses are full and equal bilaterally Extremities:     No clubbing, cyanosis, edema, or deformity noted with normal full range of motion of all joints.   Neurologic:     alert & oriented X3.  Impression & Recommendations:  Problem # 1:  HYPERTENSION (ICD-401.9)  Her updated medication list for this problem includes:    Micardis Hct 80-12.5 Mg Tabs (Telmisartan-hctz) ..... Once daily  BP today: 130/80 Prior BP: 140/80 (12/30/2007)  Prior 10 Yr Risk Heart Disease: Not enough information (09/25/2007)  Labs Reviewed: Creat: 0.8 (12/18/2006)   Problem # 2:  LOW BACK PAIN (ICD-724.2) continue wiht Dr Ethelene Hal The following medications were removed from the medication list:    Celebrex 200 Mg Caps (Celecoxib) ..... One by mouth daily as needed  Her updated medication list for this problem includes:    Percocet 10-650 Mg Tabs (Oxycodone-acetaminophen) ..... One by mouth q 6 hours prn Discussed use of moist heat or ice, modified activities, medications, and stretching/strengthening exercises. Back care instructions given. To be seen in 2 weeks if no improvement; sooner if  worsening of symptoms.   Problem # 3:  GERD (ICD-530.81) prevacid Diagnostics Reviewed:  Discussed lifestyle modifications, diet, antacids/medications, and preventive measures. Handout provided.   Complete Medication List: 1)  Os-cal 500 + D 500-200 Mg-unit Tabs (Calcium carbonate-vitamin d) .... Once daily 2)  Micardis Hct 80-12.5 Mg Tabs (Telmisartan-hctz) .... Once daily 3)  Boniva 150 Mg Tabs (Ibandronate sodium) .... Every month 4)  Requip 0.5 Mg Tabs (Ropinirole hcl) .... At bedtime 5)  Percocet 10-650 Mg Tabs (Oxycodone-acetaminophen) .... One by mouth q 6 hours prn   Patient Instructions: 1)  Please schedule a follow-up appointment in 2 months.    ]

## 2011-01-03 NOTE — Consult Note (Signed)
Summary: Dr Noel Gerold note  Dr Noel Gerold note   Imported By: Kassie Mends 02/23/2008 09:47:05  _____________________________________________________________________  External Attachment:    Type:   Image     Comment:   Dr Noel Gerold note

## 2011-01-03 NOTE — Letter (Signed)
Summary: Previsit letter  [facility]  [address] [place], [place] [address]   Phone: [phone]  Fax: [phone]       01/09/2010 MRN: [id number]  [facility] [address] [place], [place]  [address]  Dear Ms. Reznik,  Welcome to the Gastroenterology Division at [facility].    You are scheduled to see a nurse for your pre-procedure visit on 02-14-10 at 9:00a.m. on the 3rd floor at [facility], [address]  We ask that you try to arrive at our office 15 minutes prior to your appointment time to allow for check-in.  Your nurse visit will consist of discussing your medical and surgical history, your immediate family medical history, and your medications.    Please bring a complete list of all your medications or, if you prefer, bring the medication bottles and we will list them.  We will need to be aware of both prescribed and over the counter drugs.  We will need to know exact dosage information as well.  If you are on blood thinners (Coumadin, Plavix, Aggrenox, Ticlid, etc.) please call our office today/prior to your appointment, as we need to consult with your physician about holding your medication.   Please be prepared to read and sign documents such as consent forms, a financial agreement, and acknowledgement forms.  If necessary, and with your consent, a friend or relative is welcome to sit-in on the nurse visit with you.  Please bring your insurance card so that we may make a copy of it.  If your insurance requires a referral to see a specialist, please bring your referral form from your primary care physician.  No co-pay is required for this nurse visit.     If you cannot keep your appointment, please call [phone] to cancel or reschedule prior to your appointment date.  This allows [place] the opportunity to schedule an appointment for another patient in need of care.    Thank you for choosing [facility] for your medical  needs.  We appreciate the opportunity to care for you.  Please visit [place] at our website  to learn more about our practice.                     Sincerely.                                                                                                                   The Gastroenterology Division

## 2011-01-03 NOTE — Assessment & Plan Note (Signed)
Summary: 6 WK ROV/NJR   Vital Signs:  Patient profile:   77 year old female Height:      67 inches Weight:      188 pounds BMI:     29.55 Temp:     98.2 degrees F oral Pulse rate:   76 / minute Resp:     14 per minute BP sitting:   152 / 80  (left arm)  Vitals Entered By: Willy Eddy, LPN (January 08, 2010 10:17 AM) CC: roa- changed bp med from micardis to tribenzor lat ov, Hypertension Management   CC:  roa- changed bp med from micardis to tribenzor lat ov and Hypertension Management.  History of Present Illness: gaining weight ( depressed and possible over eating) she states thagt she has increased constipation ...takes a laxitive if goes three days. SHe has no early satiety. Had a colon scheduled for screening but cancelled due to problems in family and did not reschedule    Hypertension History:      She denies headache, chest pain, palpitations, dyspnea with exertion, orthopnea, PND, peripheral edema, visual symptoms, neurologic problems, syncope, and side effects from treatment.        Positive major cardiovascular risk factors include female age 22 years old or older and hypertension.  Negative major cardiovascular risk factors include negative family history for ischemic heart disease and non-tobacco-user status.     Preventive Screening-Counseling & Management  Alcohol-Tobacco     Smoking Status: quit     Year Quit: 2000     Passive Smoke Exposure: no  Problems Prior to Update: 1)  Chronic Obstructive Pulmonary Disease, Moderate  (ICD-496) 2)  Constipation, Chronic  (ICD-564.09) 3)  Obst Chronic Bronchitis W/acute Bronchitis  (ICD-491.22) 4)  Anemia-iron Deficiency  (ICD-280.9) 5)  Uti's, Hx of  (ICD-V13.00) 6)  Low Back Pain  (ICD-724.2) 7)  Family History Breast Cancer 1st Degree Relative <50  (ICD-V16.3) 8)  Preventive Health Care  (ICD-V70.0) 9)  Family History of Cad Female 1st Degree Relative <50  (ICD-V17.3) 10)  Restless Leg Syndrome   (ICD-333.94) 11)  Osteoporosis  (ICD-733.00) 12)  Hypertension  (ICD-401.9) 13)  Gerd  (ICD-530.81)  Current Problems (verified): 1)  Chronic Obstructive Pulmonary Disease, Moderate  (ICD-496) 2)  Constipation, Chronic  (ICD-564.09) 3)  Obst Chronic Bronchitis W/acute Bronchitis  (ICD-491.22) 4)  Anemia-iron Deficiency  (ICD-280.9) 5)  Uti's, Hx of  (ICD-V13.00) 6)  Low Back Pain  (ICD-724.2) 7)  Family History Breast Cancer 1st Degree Relative <50  (ICD-V16.3) 8)  Preventive Health Care  (ICD-V70.0) 9)  Family History of Cad Female 1st Degree Relative <50  (ICD-V17.3) 10)  Restless Leg Syndrome  (ICD-333.94) 11)  Osteoporosis  (ICD-733.00) 12)  Hypertension  (ICD-401.9) 13)  Gerd  (ICD-530.81)  Medications Prior to Update: 1)  Os-Cal 500 + D 500-200 Mg-Unit  Tabs (Calcium Carbonate-Vitamin D) .... Once Daily 2)  Tribenzor 40-5-12.5 Mg Tabs (Olmesartan-Amlodipine-Hctz) .... One By Mouth Daily 3)  Boniva 150 Mg  Tabs (Ibandronate Sodium) .... Every Month  ( One Hold Due To Gi Complaints) 4)  Requip 0.5 Mg  Tabs (Ropinirole Hcl) .... At Bedtime 5)  Vitamin D 16109 Unit Caps (Ergocalciferol) .... One By Mouth Weekly 6)  Align  Caps (Probiotic Product) .... One By Mouth Daily 7)  Benefiber  Powd (Wheat Dextrin) .... One Pack Twice A Day 8)  Spiriva Handihaler 18 Mcg Caps (Tiotropium Bromide Monohydrate) .... One Cap Inhailed Daily 9)  Aciphex 20 Mg Tbec (  Rabeprazole Sodium) .... One By Mouth Daily ( Samples)  Current Medications (verified): 1)  Os-Cal 500 + D 500-200 Mg-Unit  Tabs (Calcium Carbonate-Vitamin D) .... Once Daily 2)  Tribenzor 40-5-12.5 Mg Tabs (Olmesartan-Amlodipine-Hctz) .... One By Mouth Daily 3)  Boniva 150 Mg  Tabs (Ibandronate Sodium) .... Every Month  ( One Hold Due To Gi Complaints) 4)  Requip 0.5 Mg  Tabs (Ropinirole Hcl) .... At Bedtime 5)  Vitamin D 16109 Unit Caps (Ergocalciferol) .... One By Mouth Weekly 6)  Align  Caps (Probiotic Product) .... One By Mouth  Daily 7)  Benefiber  Powd (Wheat Dextrin) .... One Pack Twice A Day 8)  Spiriva Handihaler 18 Mcg Caps (Tiotropium Bromide Monohydrate) .... One Cap Inhailed Daily 9)  Aciphex 20 Mg Tbec (Rabeprazole Sodium) .... One By Mouth Daily ( Samples) 10)  Tribenzor 40-10-12.5 Mg Tabs (Olmesartan-Amlodipine-Hctz) .... One By Mouth Daily  Allergies (verified): 1)  ! Codeine  Past History:  Family History: Last updated: 11-17-2007 father died from MI at 38 Family History of CAD Female 1st degree relative <50 mother died at 43  Social History: Last updated: 11/17/2007 Married Former Smoker  Risk Factors: Caffeine Use: 1 (09/25/2007)  Risk Factors: Smoking Status: quit (01/08/2010) Passive Smoke Exposure: no (01/08/2010)  Past medical, surgical, family and social histories (including risk factors) reviewed, and no changes noted (except as noted below).  Past Medical History: Reviewed history from 17-Nov-2007 and no changes required. Hyperthyroidism restlless legs Osteoporosis constipation Anemia-iron deficiency  Past Surgical History: Reviewed history from 03/01/2008 and no changes required. Appendectomy Hysterectomy Tonsillectomy Cataract extraction  Family History: Reviewed history from 11/17/2007 and no changes required. father died from MI at 1 Family History of CAD Female 1st degree relative <50 mother died at 64  Social History: Reviewed history from 2007-11-17 and no changes required. Married Former Smoker  Review of Systems  The patient denies anorexia, fever, weight loss, weight gain, vision loss, decreased hearing, hoarseness, chest pain, syncope, dyspnea on exertion, peripheral edema, prolonged cough, headaches, hemoptysis, abdominal pain, melena, hematochezia, severe indigestion/heartburn, hematuria, incontinence, genital sores, muscle weakness, suspicious skin lesions, transient blindness, difficulty walking, depression, unusual weight change, abnormal bleeding,  enlarged lymph nodes, angioedema, and breast masses.    Physical Exam  General:  alert and pale.   Head:  normocephalic and no abnormalities palpated.   Eyes:  pupils equal and pupils round.   Ears:  R ear normal and L ear normal.   Neck:  No deformities, masses, or tenderness noted. Lungs:  normal respiratory effort and no wheezes.   Heart:  normal rate and regular rhythm.   Abdomen:  soft, non-tender, no masses, and distended.   Rectal:  No external abnormalities noted. Normal sphincter tone. No rectal masses or tenderness. Msk:  No deformity or scoliosis noted of thoracic or lumbar spine.   Pulses:  R and L carotid,radial,femoral,dorsalis pedis and posterior tibial pulses are full and equal bilaterally Extremities:  trace left pedal edema and trace right pedal edema.   Neurologic:  alert & oriented X3 and cranial nerves II-XII intact.     Impression & Recommendations:  Problem # 1:  CONSTIPATION, CHRONIC (ICD-564.09)  taking the fiber every day  but still havving issue with constipation Her updated medication list for this problem includes:    Benefiber Powd (Wheat dextrin) ..... One pack twice a day  Discussed dietary fiber measures and increased water intake.  she has not noted pellet or narrow stools  but has occasionally noted  dak stools  Orders: Gastroenterology Referral (GI)  Problem # 2:  GERD (ICD-530.81) nexium  worked best but has run out of samples , the EGRD got better but the bloating did not change she feels that her stomach swells if she does not take a laxitivie Her updated medication list for this problem includes:    Dexilant 60 Mg Cpdr (Dexlansoprazole) ..... One by mouth daily  Labs Reviewed: Hgb: 12.3 (09/20/2009)   Hct: 37.1 (09/20/2009)  Problem # 3:  ANEMIA-IRON DEFICIENCY (ICD-280.9) stable and will moniter Hgb: 12.3 (09/20/2009)   Hct: 37.1 (09/20/2009)   Platelets: 293.0 (09/20/2009) RBC: 4.11 (09/20/2009)   RDW: 13.5 (09/20/2009)   WBC: 8.9  (09/20/2009) MCV: 90.2 (09/20/2009)   MCHC: 33.2 (09/20/2009) Iron: 66 (09/20/2009)   % Sat: 16.3 (09/20/2009) TSH: 1.10 (09/20/2009)  Orders: Venipuncture (47829) TLB-IBC Pnl (Iron/FE;Transferrin) (83550-IBC) TLB-CBC Platelet - w/Differential (85025-CBCD) Gastroenterology Referral (GI)  Problem # 4:  CHRONIC OBSTRUCTIVE PULMONARY DISEASE, MODERATE (ICD-496) using the inhailer daily Her updated medication list for this problem includes:    Spiriva Handihaler 18 Mcg Caps (Tiotropium bromide monohydrate) ..... One cap inhailed daily  Vaccines Reviewed: Pneumovax: Historical (12/02/2005)   Flu Vax: Fluvax 3+ (09/20/2009)  Problem # 5:  HYPERTENSION (ICD-401.9) not at goal increas medications The following medications were removed from the medication list:    Tribenzor 40-5-12.5 Mg Tabs (Olmesartan-amlodipine-hctz) ..... One by mouth daily Her updated medication list for this problem includes:    Tribenzor 40-10-12.5 Mg Tabs (Olmesartan-amlodipine-hctz) ..... One by mouth daily  BP today: 152/80 Prior BP: 154/80 (11/27/2009)  Prior 10 Yr Risk Heart Disease: Not enough information (09/25/2007)  Labs Reviewed: K+: 4.1 (02/22/2009) Creat: : 0.6 (02/22/2009)     Complete Medication List: 1)  Os-cal 500 + D 500-200 Mg-unit Tabs (Calcium carbonate-vitamin d) .... Once daily 2)  Requip 0.5 Mg Tabs (Ropinirole hcl) .... At bedtime 3)  Vitamin D 56213 Unit Caps (Ergocalciferol) .... One by mouth weekly 4)  Align Caps (Probiotic product) .... One by mouth daily 5)  Benefiber Powd (Wheat dextrin) .... One pack twice a day 6)  Spiriva Handihaler 18 Mcg Caps (Tiotropium bromide monohydrate) .... One cap inhailed daily 7)  Dexilant 60 Mg Cpdr (Dexlansoprazole) .... One by mouth daily 8)  Tribenzor 40-10-12.5 Mg Tabs (Olmesartan-amlodipine-hctz) .... One by mouth daily  Hypertension Assessment/Plan:      The patient's hypertensive risk group is category B: At least one risk factor  (excluding diabetes) with no target organ damage.  Today's blood pressure is 152/80.  Her blood pressure goal is < 140/90.  Patient Instructions: 1)  Please schedule a follow-up appointment in 6 weeks.

## 2011-01-03 NOTE — Assessment & Plan Note (Signed)
Summary: 6 WK/MM   Vital Signs:  Patient profile:   77 year old female Height:      67 inches Weight:      186 pounds BMI:     29.24 Temp:     98.3 degrees F oral Pulse rate:   72 / minute Resp:     14 per minute BP sitting:   136 / 76  (left arm)  Vitals Entered By: Willy Eddy, LPN (February 19, 2010 10:19 AM) CC: roa- increased tribenzor dosage last ov and changed to dexilant which she states is working well, Hypertension Management   CC:  roa- increased tribenzor dosage last ov and changed to dexilant which she states is working well and Hypertension Management.  History of Present Illness: The tribenzone is working well no side effects and the blood presure is well controlled GERD is better on medicatons constipation is better and has a colon next week Gerilyn Pilgrim)   Hypertension History:      She denies headache, chest pain, palpitations, dyspnea with exertion, orthopnea, PND, peripheral edema, visual symptoms, neurologic problems, syncope, and side effects from treatment.        Positive major cardiovascular risk factors include female age 39 years old or older and hypertension.  Negative major cardiovascular risk factors include negative family history for ischemic heart disease and non-tobacco-user status.     Preventive Screening-Counseling & Management  Alcohol-Tobacco     Smoking Status: quit     Year Quit: 2000     Passive Smoke Exposure: no  Problems Prior to Update: 1)  Chronic Obstructive Pulmonary Disease, Moderate  (ICD-496) 2)  Constipation, Chronic  (ICD-564.09) 3)  Obst Chronic Bronchitis W/acute Bronchitis  (ICD-491.22) 4)  Anemia-iron Deficiency  (ICD-280.9) 5)  Uti's, Hx of  (ICD-V13.00) 6)  Low Back Pain  (ICD-724.2) 7)  Family History Breast Cancer 1st Degree Relative <50  (ICD-V16.3) 8)  Preventive Health Care  (ICD-V70.0) 9)  Family History of Cad Female 1st Degree Relative <50  (ICD-V17.3) 10)  Restless Leg Syndrome  (ICD-333.94) 11)   Osteoporosis  (ICD-733.00) 12)  Hypertension  (ICD-401.9) 13)  Gerd  (ICD-530.81)  Current Problems (verified): 1)  Chronic Obstructive Pulmonary Disease, Moderate  (ICD-496) 2)  Constipation, Chronic  (ICD-564.09) 3)  Obst Chronic Bronchitis W/acute Bronchitis  (ICD-491.22) 4)  Anemia-iron Deficiency  (ICD-280.9) 5)  Uti's, Hx of  (ICD-V13.00) 6)  Low Back Pain  (ICD-724.2) 7)  Family History Breast Cancer 1st Degree Relative <50  (ICD-V16.3) 8)  Preventive Health Care  (ICD-V70.0) 9)  Family History of Cad Female 1st Degree Relative <50  (ICD-V17.3) 10)  Restless Leg Syndrome  (ICD-333.94) 11)  Osteoporosis  (ICD-733.00) 12)  Hypertension  (ICD-401.9) 13)  Gerd  (ICD-530.81)  Medications Prior to Update: 1)  Os-Cal 500 + D 500-200 Mg-Unit  Tabs (Calcium Carbonate-Vitamin D) .... Once Daily 2)  Requip 0.5 Mg  Tabs (Ropinirole Hcl) .... At Bedtime 3)  Vitamin D 25366 Unit Caps (Ergocalciferol) .... One By Mouth Weekly 4)  Align  Caps (Probiotic Product) .... One By Mouth Daily 5)  Benefiber  Powd (Wheat Dextrin) .... One Pack Twice A Day 6)  Spiriva Handihaler 18 Mcg Caps (Tiotropium Bromide Monohydrate) .... One Cap Inhailed Daily 7)  Dexilant 60 Mg Cpdr (Dexlansoprazole) .... One By Mouth Daily 8)  Tribenzor 40-10-12.5 Mg Tabs (Olmesartan-Amlodipine-Hctz) .... One By Mouth Daily 9)  Moviprep 100 Gm  Solr (Peg-Kcl-Nacl-Nasulf-Na Asc-C) .... As Per Prep Instructions.  Current Medications (verified):  1)  Os-Cal 500 + D 500-200 Mg-Unit  Tabs (Calcium Carbonate-Vitamin D) .... Once Daily 2)  Requip 0.5 Mg  Tabs (Ropinirole Hcl) .... At Bedtime 3)  Vitamin D 81191 Unit Caps (Ergocalciferol) .... One By Mouth Weekly 4)  Align  Caps (Probiotic Product) .... One By Mouth Daily 5)  Benefiber  Powd (Wheat Dextrin) .... One Pack Twice A Day 6)  Spiriva Handihaler 18 Mcg Caps (Tiotropium Bromide Monohydrate) .... One Cap Inhailed Daily 7)  Dexilant 60 Mg Cpdr (Dexlansoprazole) .... One By  Mouth Daily 8)  Tribenzor 40-10-12.5 Mg Tabs (Olmesartan-Amlodipine-Hctz) .... One By Mouth Daily 9)  Moviprep 100 Gm  Solr (Peg-Kcl-Nacl-Nasulf-Na Asc-C) .... As Per Prep Instructions.  Allergies (verified): 1)  ! Codeine  Past History:  Family History: Last updated: 11-22-2007 father died from MI at 26 Family History of CAD Female 1st degree relative <50 mother died at 81  Social History: Last updated: 11/22/07 Married Former Smoker  Risk Factors: Caffeine Use: 1 (09/25/2007)  Risk Factors: Smoking Status: quit (02/19/2010) Passive Smoke Exposure: no (02/19/2010)  Past medical, surgical, family and social histories (including risk factors) reviewed, and no changes noted (except as noted below).  Past Medical History: Reviewed history from 2007-11-22 and no changes required. Hyperthyroidism restlless legs Osteoporosis constipation Anemia-iron deficiency  Past Surgical History: Reviewed history from 03/01/2008 and no changes required. Appendectomy Hysterectomy Tonsillectomy Cataract extraction  Family History: Reviewed history from 11-22-07 and no changes required. father died from MI at 52 Family History of CAD Female 1st degree relative <50 mother died at 19  Social History: Reviewed history from Nov 22, 2007 and no changes required. Married Former Smoker  Review of Systems       The patient complains of severe indigestion/heartburn.  The patient denies anorexia, fever, weight loss, weight gain, vision loss, decreased hearing, hoarseness, chest pain, syncope, dyspnea on exertion, peripheral edema, prolonged cough, headaches, hemoptysis, abdominal pain, melena, hematochezia, hematuria, incontinence, genital sores, muscle weakness, suspicious skin lesions, transient blindness, difficulty walking, depression, unusual weight change, abnormal bleeding, enlarged lymph nodes, angioedema, and breast masses.    Physical Exam  General:  alert and pale.   Head:   normocephalic and no abnormalities palpated.   Ears:  R ear normal and L ear normal.   Neck:  No deformities, masses, or tenderness noted. Lungs:  normal respiratory effort and no wheezes.   Heart:  normal rate and regular rhythm.   Abdomen:  soft, non-tender, no masses, and distended.   Rectal:  No external abnormalities noted. Normal sphincter tone. No rectal masses or tenderness. Msk:  No deformity or scoliosis noted of thoracic or lumbar spine.   Pulses:  R and L carotid,radial,femoral,dorsalis pedis and posterior tibial pulses are full and equal bilaterally Extremities:  trace left pedal edema and trace right pedal edema.   Neurologic:  alert & oriented X3.     Impression & Recommendations:  Problem # 1:  CHRONIC OBSTRUCTIVE PULMONARY DISEASE, MODERATE (ICD-496) needs pulmonray rehab... needs to walk stable Her updated medication list for this problem includes:    Spiriva Handihaler 18 Mcg Caps (Tiotropium bromide monohydrate) ..... One cap inhailed daily  Vaccines Reviewed: Pneumovax: Historical (12/02/2005)   Flu Vax: Fluvax 3+ (09/20/2009)  Problem # 2:  ANEMIA-IRON DEFICIENCY (ICD-280.9) monitered labs  Problem # 3:  HYPERTENSION (ICD-401.9) Assessment: Improved  Her updated medication list for this problem includes:    Tribenzor 40-10-12.5 Mg Tabs (Olmesartan-amlodipine-hctz) ..... One by mouth daily  BP today: 136/76 Prior BP: 152/80 (  01/08/2010)  Prior 10 Yr Risk Heart Disease: Not enough information (09/25/2007)  Labs Reviewed: K+: 4.1 (02/22/2009) Creat: : 0.6 (02/22/2009)     Problem # 4:  GERD (ICD-530.81) Assessment: Unchanged  Her updated medication list for this problem includes:    Dexilant 60 Mg Cpdr (Dexlansoprazole) ..... One by mouth daily  Labs Reviewed: Hgb: 12.3 (01/08/2010)   Hct: 37.7 (01/08/2010)  Complete Medication List: 1)  Os-cal 500 + D 500-200 Mg-unit Tabs (Calcium carbonate-vitamin d) .... Once daily 2)  Requip 0.5 Mg Tabs  (Ropinirole hcl) .... At bedtime 3)  Vitamin D 44010 Unit Caps (Ergocalciferol) .... One by mouth weekly 4)  Align Caps (Probiotic product) .... One by mouth daily 5)  Benefiber Powd (Wheat dextrin) .... One pack twice a day 6)  Spiriva Handihaler 18 Mcg Caps (Tiotropium bromide monohydrate) .... One cap inhailed daily 7)  Dexilant 60 Mg Cpdr (Dexlansoprazole) .... One by mouth daily 8)  Tribenzor 40-10-12.5 Mg Tabs (Olmesartan-amlodipine-hctz) .... One by mouth daily 9)  Moviprep 100 Gm Solr (Peg-kcl-nacl-nasulf-na asc-c) .... As per prep instructions.  Hypertension Assessment/Plan:      The patient's hypertensive risk group is category B: At least one risk factor (excluding diabetes) with no target organ damage.  Today's blood pressure is 136/76.  Her blood pressure goal is < 140/90.  Patient Instructions: 1)  Please schedule a follow-up appointment in 3 months.

## 2011-01-03 NOTE — Letter (Signed)
Summary: Out of Work  Adult nurse at [employer]  [address]   [place], [place] [address]   Phone: [phone]  Fax: [phone]    December 30, 2007   Employee:  ANNI HOCEVAR    To Whom It May Concern:   For Medical reasons, please excuse the above named patient from jury duty for the following dates:  Start:   February 02, 2008  End:   lifelong  Due to chronic back pain  If you need additional information, please feel free to contact our office.         Sincerely,    Stacie Glaze MD

## 2011-01-03 NOTE — Procedures (Signed)
Summary: Colonoscopy  Patient: Cheryl Atkinson Note: All result statuses are Final unless otherwise noted.  Tests: (1) Colonoscopy (COL)   COL Colonoscopy           DONE     [facility]     [address]     [place], [place]  [address]           COLONOSCOPY PROCEDURE REPORT           PATIENT:  Cheryl, Atkinson  MR#:  [id number]     BIRTHDATE:  11/20/1931, 78 yrs. old  GENDER:  female     ENDOSCOPIST:  Rachael Fee, MD     REF. BY:  Stacie Glaze, M.D.     PROCEDURE DATE:  02/28/2010     PROCEDURE:  Colonoscopy with snare polypectomy     ASA CLASS:  Class II     INDICATIONS:  Routine Risk Screening     MEDICATIONS:   Fentanyl 50 mcg IV, Versed 6 mg IV           DESCRIPTION OF PROCEDURE:   After the risks benefits and     alternatives of the procedure were thoroughly explained, informed     consent was obtained.  Digital rectal exam was performed and     revealed no rectal masses.   The LB [id number] endoscope     was introduced through the anus and advanced to the cecum, which     was identified by both the appendix and ileocecal valve, without     limitations.  The quality of the prep was good, using MoviPrep.     The instrument was then slowly withdrawn as the colon was fully     examined.     <<PROCEDUREIMAGES>>           FINDINGS:  Three sessile polyps were found, removed and all were     sent to pathology.  One was in descending colon, 15mm across,     removed with snare/cautery. Two were in sigmoid colon, 4-41mm     across, removed with cold snare (see image5, image6, image7,     image8, and image9).  There were two 5-47mm AVMs in cecum, not     treated (see image1).  This was otherwise a normal examination of     the colon (see image3, image2, and image10).   Retroflexed views     in the rectum revealed no abnormalities.    The scope was then     withdrawn from the patient and the procedure completed.           COMPLICATIONS:  None     ENDOSCOPIC  IMPRESSION:     1) Three polyps were found, all were removed and sent to     pathology     2) Two AVMs in cecum     3) Otherwise normal examination           RECOMMENDATIONS:     1) If the polyp(s) removed today are proven to be adenomatous     (pre-cancerous) polyps, you will need a colonoscopy in 3 years.     2) You will receive a letter within 1-2 weeks with the results     of your biopsy as well as final recommendations. Please call my     office if you have not received a letter after 3 weeks.           ______________________________  Rachael Fee, MD           n.     Rosalie Doctor:   Rachael Fee at 02/28/2010 09:00 AM           [name], [id number]  Note: An exclamation mark (!) indicates a result that was not dispersed into the flowsheet. Document Creation Date: 02/28/2010 9:00 AM _______________________________________________________________________  (1) Order result status: Final Collection or observation date-time: 02/28/2010 08:53 Requested date-time:  Receipt date-time:  Reported date-time:  Referring Physician:   Ordering Physician: Rob Bunting [phone]) Specimen Source:  Source: Launa Grill Order Number: [id number] Lab site:   Appended Document: Colonoscopy     Procedures Next Due Date:    Colonoscopy: 01/2013

## 2011-01-03 NOTE — Miscellaneous (Signed)
Summary: BONE DENSITY  Clinical Lists Changes  Orders: Added new Test order of T-Bone Densitometry (77080) - Signed Added new Test order of T-Lumbar Vertebral Assessment (77082) - Signed 

## 2011-01-03 NOTE — Miscellaneous (Signed)
[facility]: [facility]   Imported By: Kassie Mends 12/25/2007 08:25:20  _____________________________________________________________________  External Attachment:    Type:   Image     Comment:   [facility]

## 2011-01-03 NOTE — Miscellaneous (Signed)
Summary: Progress Note/[facility]  Progress Note/ Physical Therapy   Imported By: Maryln Gottron 11/01/2010 14:08:21  _____________________________________________________________________  External Attachment:    Type:   Image     Comment:   External Document

## 2011-01-03 NOTE — Progress Notes (Signed)
Summary: percocet  Phone Note Refill Request Message from:  Patient on December 08, 2007 10:46 AM  Refills Requested: Medication #1:  PERCOCET 10-650 MG  TABS one by mouth q 6 hours prn. Pt. walks in requesting Percocet refill. Last refill 11/09/2007#100  [facility]ill?  [facility]   Method Requested: call pharmacy Initial call taken by: Lynann Beaver CMA,  December 08, 2007 10:47 AM  Follow-up for Phone Call        cannot call i n may print  and pt may pick up Follow-up by: Stacie Glaze MD,  December 08, 2007 1:38 PM  Additional Follow-up for Phone Call Additional follow up Details #1::        cannot get through on pt's phone...Marland KitchenMarland KitchenMarland Kitchenbusy x 3. pres will be left up front and we will continue to try t contact pt. Additional Follow-up by: Lynann Beaver CMA,  December 08, 2007 2:17 PM    called again and line was busy. ..................................................................Marland KitchenLynann Beaver CMA  December 08, 2007 4:12 PM    notified pt that her prescription is ready. ..................................................................Marland KitchenLynann Beaver CMA  December 08, 2007 4:54 PM   Prescriptions: PERCOCET 10-650 MG  TABS (OXYCODONE-ACETAMINOPHEN) one by mouth q 6 hours prn  #100 x 0   Entered and Authorized by:   Stacie Glaze MD   Signed by:   Stacie Glaze MD on 12/08/2007   Method used:   Print then Give to Patient   RxID:   [id number]

## 2011-01-04 ENCOUNTER — Ambulatory Visit
Admission: RE | Admit: 2011-01-04 | Discharge: 2011-01-04 | Disposition: A | Discharge status: Home / Self Care | Payer: Medicare Other | Source: Ambulatory Visit | Attending: Orthopedic Surgery | Admitting: Orthopedic Surgery

## 2011-01-04 DIAGNOSIS — M549 Dorsalgia, unspecified: Secondary | ICD-10-CM

## 2011-01-09 NOTE — Assessment & Plan Note (Signed)
Summary: 2 month fup//ccm   Vital Signs:  Patient profile:   77 year old female Height:      67 inches Weight:      188 pounds BMI:     29.55 Temp:     98.2 degrees F oral Pulse rate:   76 / minute Resp:     14 per minute BP sitting:   130 / 80  (left arm)  Vitals Entered By: Willy Eddy, LPN (December 18, 2010 11:15 AM)  Nutrition Counseling: Patient's BMI is greater than 25 and therefore counseled on weight management options. CC: roa- continues to c/o of back pain, even after having 2 injections and physical therapy, Hypertension Management Is Patient Diabetic? No   Primary Care Provider:  Stacie Glaze MD  CC:  roa- continues to c/o of back pain, even after having 2 injections and physical therapy, and Hypertension Management.  History of Present Illness: Has compression fracture and has been getting pain injections with Dr Loletha Carrow. She is on tylenol only for back pain she has not tried tramadol Low exercize due to pain Has some GERD symptom(improved with the dexilant). The pain radiates to the back and is improved with Mylanta TUMS  Hypertension History:      She denies headache, chest pain, palpitations, dyspnea with exertion, orthopnea, PND, peripheral edema, visual symptoms, neurologic problems, syncope, and side effects from treatment.        Positive major cardiovascular risk factors include female age 59 years old or older and hypertension.  Negative major cardiovascular risk factors include negative family history for ischemic heart disease and non-tobacco-user status.     Preventive Screening-Counseling & Management  Alcohol-Tobacco     Smoking Status: quit     Year Quit: 2000     Passive Smoke Exposure: no  Problems Prior to Update: 1)  Hip Pain, Bilateral  (ICD-719.45) 2)  Chronic Obstructive Pulmonary Disease, Moderate  (ICD-496) 3)  Constipation, Chronic  (ICD-564.09) 4)  Obst Chronic Bronchitis W/acute Bronchitis  (ICD-491.22) 5)  Anemia-iron  Deficiency  (ICD-280.9) 6)  Uti's, Hx of  (ICD-V13.00) 7)  Low Back Pain  (ICD-724.2) 8)  Family History Breast Cancer 1st Degree Relative <50  (ICD-V16.3) 9)  Preventive Health Care  (ICD-V70.0) 10)  Family History of Cad Female 1st Degree Relative <50  (ICD-V17.3) 11)  Restless Leg Syndrome  (ICD-333.94) 12)  Osteoporosis  (ICD-733.00) 13)  Hypertension  (ICD-401.9) 14)  Gerd  (ICD-530.81)  Current Problems (verified): 1)  Hip Pain, Bilateral  (ICD-719.45) 2)  Chronic Obstructive Pulmonary Disease, Moderate  (ICD-496) 3)  Constipation, Chronic  (ICD-564.09) 4)  Obst Chronic Bronchitis W/acute Bronchitis  (ICD-491.22) 5)  Anemia-iron Deficiency  (ICD-280.9) 6)  Uti's, Hx of  (ICD-V13.00) 7)  Low Back Pain  (ICD-724.2) 8)  Family History Breast Cancer 1st Degree Relative <50  (ICD-V16.3) 9)  Preventive Health Care  (ICD-V70.0) 10)  Family History of Cad Female 1st Degree Relative <50  (ICD-V17.3) 11)  Restless Leg Syndrome  (ICD-333.94) 12)  Osteoporosis  (ICD-733.00) 13)  Hypertension  (ICD-401.9) 14)  Gerd  (ICD-530.81)  Medications Prior to Update: 1)  Os-Cal 500 + D 500-200 Mg-Unit  Tabs (Calcium Carbonate-Vitamin D) .... Once Daily 2)  Ropinirole Hcl 1 Mg Tabs (Ropinirole Hcl) .... One By Mouth Q Hs 3)  Vitamin D 16109 Unit Caps (Ergocalciferol) .... One Twice A Week 4)  Align  Caps (Probiotic Product) .... One By Mouth Daily 5)  Benefiber  Powd (Wheat Dextrin) .Marland KitchenMarland KitchenMarland Kitchen  One Pack Twice A Day 6)  Dulera 100-5 Mcg/act Aero (Mometasone Furo-Formoterol Fum) .... Two Puff Two Times A Day 7)  Dexilant 60 Mg Cpdr (Dexlansoprazole) .... One By Mouth Daily 8)  Tribenzor 40-10-12.5 Mg Tabs (Olmesartan-Amlodipine-Hctz) .... One By Mouth Daily 9)  Meloxicam 15 Mg Tabs (Meloxicam) .... One By Mouth Daily  Current Medications (verified): 1)  Os-Cal 500 + D 500-200 Mg-Unit  Tabs (Calcium Carbonate-Vitamin D) .... Once Daily 2)  Ropinirole Hcl 1 Mg Tabs (Ropinirole Hcl) .... One By Mouth Q Hs 3)   Vitamin D 54270 Unit Caps (Ergocalciferol) .... One Twice A Week 4)  Align  Caps (Probiotic Product) .... One By Mouth Daily 5)  Benefiber  Powd (Wheat Dextrin) .... One Pack Twice A Day 6)  Dulera 100-5 Mcg/act Aero (Mometasone Furo-Formoterol Fum) .... Two Puff Two Times A Day 7)  Dexilant 60 Mg Cpdr (Dexlansoprazole) .... One By Mouth Daily 8)  Tribenzor 40-10-12.5 Mg Tabs (Olmesartan-Amlodipine-Hctz) .... One By Mouth Daily 9)  Tramadol Hcl 50 Mg Tabs (Tramadol Hcl) .... One  By Mouth 4 Times A Day With A 325 Mg Tylenol  Allergies (verified): 1)  ! Codeine  Past History:  Family History: Last updated: 2007-12-07 father died from MI at 38 Family History of CAD Female 1st degree relative <50 mother died at 22  Social History: Last updated: 07-Dec-2007 Married Former Smoker  Risk Factors: Caffeine Use: 1 (09/25/2007)  Risk Factors: Smoking Status: quit (12/18/2010) Passive Smoke Exposure: no (12/18/2010)  Past medical, surgical, family and social histories (including risk factors) reviewed, and no changes noted (except as noted below).  Past Medical History: Reviewed history from December 07, 2007 and no changes required. Hyperthyroidism restlless legs Osteoporosis constipation Anemia-iron deficiency  Past Surgical History: Reviewed history from 03/01/2008 and no changes required. Appendectomy Hysterectomy Tonsillectomy Cataract extraction  Family History: Reviewed history from 12/07/07 and no changes required. father died from MI at 52 Family History of CAD Female 1st degree relative <50 mother died at 15  Social History: Reviewed history from 12-07-07 and no changes required. Married Former Smoker  Review of Systems       The patient complains of severe indigestion/heartburn.  The patient denies anorexia, fever, weight loss, weight gain, vision loss, decreased hearing, hoarseness, chest pain, syncope, dyspnea on exertion, peripheral edema, prolonged cough,  headaches, hemoptysis, abdominal pain, melena, hematochezia, hematuria, incontinence, genital sores, muscle weakness, suspicious skin lesions, transient blindness, difficulty walking, depression, unusual weight change, abnormal bleeding, enlarged lymph nodes, angioedema, and breast masses.    Physical Exam  General:  alert and pale.   Head:  normocephalic and no abnormalities palpated.   Eyes:  pupils equal and pupils round.   Ears:  R ear normal and L ear normal.   Mouth:  pharynx pink and moist.   Neck:  No deformities, masses, or tenderness noted. Lungs:  normal respiratory effort and no wheezes.   Heart:  normal rate and regular rhythm.   Abdomen:  soft and non-tender.   Msk:  joint tenderness and joint swelling.   Pulses:  R and L carotid,radial,femoral,dorsalis pedis and posterior tibial pulses are full and equal bilaterally Extremities:  trace left pedal edema and trace right pedal edema.   Neurologic:  alert & oriented X3 and abnormal gait.     Impression & Recommendations:  Problem # 1:  CHRONIC OBSTRUCTIVE PULMONARY DISEASE, MODERATE (ICD-496) Assessment Unchanged  Her updated medication list for this problem includes:    Dulera 100-5 Mcg/act Aero (Mometasone furo-formoterol  fum) ....Marland Kitchen Two puff two times a day  Vaccines Reviewed: Pneumovax: Historical (12/02/2005)   Flu Vax: Fluvax 3+ (09/20/2009)  Problem # 2:  HYPERTENSION (ICD-401.9) Assessment: Improved  Her updated medication list for this problem includes:    Tribenzor 40-10-12.5 Mg Tabs (Olmesartan-amlodipine-hctz) ..... One by mouth daily  BP today: 130/80 Prior BP: 124/80 (10/15/2010)  Prior 10 Yr Risk Heart Disease: Not enough information (09/25/2007)  Labs Reviewed: K+: 4.1 (02/22/2009) Creat: : 0.6 (02/22/2009)     Problem # 3:  GERD (ICD-530.81) the pt has increased GERD Her updated medication list for this problem includes:    Dexilant 60 Mg Cpdr (Dexlansoprazole) ..... One by mouth  daily  Labs Reviewed: Hgb: 12.0 (07/16/2010)   Hct: 35.7 (07/16/2010)  Complete Medication List: 1)  Os-cal 500 + D 500-200 Mg-unit Tabs (Calcium carbonate-vitamin d) .... Once daily 2)  Ropinirole Hcl 1 Mg Tabs (Ropinirole hcl) .... One by mouth q hs 3)  Vitamin D 56213 Unit Caps (Ergocalciferol) .... One twice a week 4)  Align Caps (Probiotic product) .... One by mouth daily 5)  Benefiber Powd (Wheat dextrin) .... One pack twice a day 6)  Dulera 100-5 Mcg/act Aero (Mometasone furo-formoterol fum) .... Two puff two times a day 7)  Dexilant 60 Mg Cpdr (Dexlansoprazole) .... One by mouth daily 8)  Tribenzor 40-10-12.5 Mg Tabs (Olmesartan-amlodipine-hctz) .... One by mouth daily 9)  Tramadol Hcl 50 Mg Tabs (Tramadol hcl) .... One  by mouth 4 times a day with a 325 mg tylenol  Hypertension Assessment/Plan:      The patient's hypertensive risk group is category B: At least one risk factor (excluding diabetes) with no target organ damage.  Today's blood pressure is 130/80.  Her blood pressure goal is < 140/90.  Patient Instructions: 1)  Please schedule a follow-up appointment in 3 months. Prescriptions: TRAMADOL HCL 50 MG TABS (TRAMADOL HCL) one  by mouth 4 times a day with a 325 mg tylenol  #120 x 4   Entered and Authorized by:   Stacie Glaze MD   Signed by:   Stacie Glaze MD on 12/18/2010   Method used:   [facility]ronically to        [facility] [address] [phone][facility] (retail)       [address]       [place], [place]  [address]       Ph: [phone] or [phone]       Fax: [phone]   RxID:   [id number]    Orders Added: 1)  Est. Patient Level IV [88416]

## 2011-02-11 LAB — POCT URINALYSIS DIPSTICK
Bilirubin Urine: NEGATIVE
Glucose, UA: NEGATIVE mg/dL
Hgb urine dipstick: NEGATIVE
Ketones, ur: NEGATIVE mg/dL
Nitrite: NEGATIVE
Protein, ur: NEGATIVE mg/dL
Specific Gravity, Urine: 1.015 (ref 1.005–1.030)
Urobilinogen, UA: 1 mg/dL (ref 0.0–1.0)
pH: 6.5 (ref 5.0–8.0)

## 2011-03-07 ENCOUNTER — Encounter: Payer: Self-pay | Admitting: *Deleted

## 2011-03-09 LAB — POCT URINALYSIS DIP (DEVICE)
Bilirubin Urine: NEGATIVE
Glucose, UA: NEGATIVE mg/dL
Hgb urine dipstick: NEGATIVE
Ketones, ur: NEGATIVE mg/dL
Nitrite: NEGATIVE
Protein, ur: NEGATIVE mg/dL
Specific Gravity, Urine: 1.01 (ref 1.005–1.030)
Urobilinogen, UA: 0.2 mg/dL (ref 0.0–1.0)
pH: 6 (ref 5.0–8.0)

## 2011-03-09 LAB — URINE CULTURE

## 2011-03-09 LAB — GLUCOSE, CAPILLARY: Glucose-Capillary: 97 mg/dL (ref 70–99)

## 2011-03-19 ENCOUNTER — Ambulatory Visit (INDEPENDENT_AMBULATORY_CARE_PROVIDER_SITE_OTHER): Payer: Medicare Other | Admitting: Internal Medicine

## 2011-03-19 ENCOUNTER — Encounter: Payer: Self-pay | Admitting: Internal Medicine

## 2011-03-19 DIAGNOSIS — M545 Low back pain, unspecified: Secondary | ICD-10-CM

## 2011-03-19 DIAGNOSIS — K5909 Other constipation: Secondary | ICD-10-CM

## 2011-03-19 DIAGNOSIS — J449 Chronic obstructive pulmonary disease, unspecified: Secondary | ICD-10-CM

## 2011-03-19 DIAGNOSIS — D509 Iron deficiency anemia, unspecified: Secondary | ICD-10-CM

## 2011-03-19 LAB — CBC WITH DIFFERENTIAL/PLATELET
Basophils Absolute: 0 10*3/uL (ref 0.0–0.1)
Basophils Relative: 0.5 % (ref 0.0–3.0)
Eosinophils Absolute: 0.1 10*3/uL (ref 0.0–0.7)
Eosinophils Relative: 1.1 % (ref 0.0–5.0)
HCT: 37 % (ref 36.0–46.0)
Hemoglobin: 12.3 g/dL (ref 12.0–15.0)
Lymphocytes Relative: 28.6 % (ref 12.0–46.0)
Lymphs Abs: 2.9 10*3/uL (ref 0.7–4.0)
MCHC: 33.3 g/dL (ref 30.0–36.0)
MCV: 91 fl (ref 78.0–100.0)
Monocytes Absolute: 0.8 10*3/uL (ref 0.1–1.0)
Monocytes Relative: 8.1 % (ref 3.0–12.0)
Neutro Abs: 6.4 10*3/uL (ref 1.4–7.7)
Neutrophils Relative %: 61.7 % (ref 43.0–77.0)
Platelets: 297 10*3/uL (ref 150.0–400.0)
RBC: 4.06 Mil/uL (ref 3.87–5.11)
RDW: 14.1 % (ref 11.5–14.6)
WBC: 10.3 10*3/uL (ref 4.5–10.5)

## 2011-03-19 LAB — IRON: Iron: 79 ug/dL (ref 42–145)

## 2011-03-19 NOTE — Assessment & Plan Note (Signed)
She is doing monitoring blood tests for iron deficiency anemia we'll draw iron and a complete blood count

## 2011-03-19 NOTE — Assessment & Plan Note (Signed)
Patient is seen for her [facility] by the [facility] and she has had 4 injections have failed she is now scheduled for surgical procedure on outpatient basis which we believe to be a ganglion block.

## 2011-03-19 NOTE — Progress Notes (Signed)
  Subjective:    Patient ID: Cheryl Atkinson, female    DOB: [birth year], 77 y.o.   MRN: [id number]  HPI  Her musculoskeletal pain syndrome is so severe that she has not been able to do her routine housework she has pain both in her cervical region as well as her low back she is seeing the [facility] doctors for low back pain at this time and she scheduled for an outpatient pain procedure.  We are hopeful that alleviation of her low back pain lower to do exercises again walking which should help to alleviate some of the neck and cervical discomfort.  We have recommended placement of ice to the neck on an as-needed basis for pain control in the interim.  Her blood pressure stable today she is followed for iron deficiency anemia chronic obstructive bronchitis and hypertension  Review of Systems  Constitutional: Negative for activity change, appetite change and fatigue.  HENT: Negative for ear pain, congestion, neck pain, postnasal drip and sinus pressure.   Eyes: Negative for redness and visual disturbance.  Respiratory: Negative for cough, shortness of breath and wheezing.   Gastrointestinal: Negative for abdominal pain and abdominal distention.  Genitourinary: Negative for dysuria, frequency and menstrual problem.  Musculoskeletal: Positive for myalgias, back pain and arthralgias. Negative for joint swelling.  Skin: Negative for rash and wound.  Neurological: Negative for dizziness, weakness and headaches.  Hematological: Negative for adenopathy. Does not bruise/bleed easily.  Psychiatric/Behavioral: Negative for sleep disturbance and decreased concentration.   Past Medical History  Diagnosis Date  . Thyroid disease   . Restless leg syndrome   . Osteoporosis   . Constipation   . Anemia    Past Surgical History  Procedure Date  . Appendectomy   . Abdominal hysterectomy   . Tonsillectomy   . Eye surgery   . Cataract extraction     reports that she has been smoking.  She does  not have any smokeless tobacco history on file. She reports that she does not drink alcohol or use illicit drugs. family history includes Coronary artery disease in an unspecified family member and Heart disease in her father and unspecified family member. Allergies  Allergen Reactions  . Codeine     REACTION: unspecified       Objective:   Physical Exam  Constitutional: She is oriented to person, place, and time. She appears well-developed and well-nourished. No distress.  HENT:  Head: Normocephalic and atraumatic.  Right Ear: External ear normal.  Left Ear: External ear normal.  Nose: Nose normal.  Mouth/Throat: Oropharynx is clear and moist.  Eyes: Conjunctivae and EOM are normal. Pupils are equal, round, and reactive to light.  Neck: Normal range of motion. Neck supple. No JVD present. No tracheal deviation present. No thyromegaly present.  Cardiovascular: Normal rate, regular rhythm, normal heart sounds and intact distal pulses.   No murmur heard. Pulmonary/Chest: Effort normal and breath sounds normal. She has no wheezes. She exhibits no tenderness.  Abdominal: Soft. Bowel sounds are normal.  Musculoskeletal: She exhibits tenderness. She exhibits no edema.  Lymphadenopathy:    She has no cervical adenopathy.  Neurological: She is alert and oriented to person, place, and time. She has normal reflexes. No cranial nerve deficit.  Skin: Skin is warm and dry. She is not diaphoretic.  Psychiatric: She has a normal mood and affect. Her behavior is normal.          Assessment & Plan:

## 2011-03-19 NOTE — Assessment & Plan Note (Signed)
In the spring pollen season her breathing has been stable she continues to use Dulera 2 puffs twice daily

## 2011-03-19 NOTE — Assessment & Plan Note (Signed)
The patient has medication-induced constipation and she is on MiraLax she is using the MiraLax every day we instructed her that she could use it actually twice a day as needed to keep her stool soft and to avoid abdominal distention or pain

## 2011-04-15 ENCOUNTER — Ambulatory Visit (INDEPENDENT_AMBULATORY_CARE_PROVIDER_SITE_OTHER): Payer: Medicare Other

## 2011-04-15 ENCOUNTER — Inpatient Hospital Stay (INDEPENDENT_AMBULATORY_CARE_PROVIDER_SITE_OTHER)
Admission: RE | Admit: 2011-04-15 | Discharge: 2011-04-15 | Disposition: A | Discharge status: Home / Self Care | Payer: Medicare Other | Source: Ambulatory Visit | Attending: Family Medicine | Admitting: Family Medicine

## 2011-04-15 DIAGNOSIS — R109 Unspecified abdominal pain: Secondary | ICD-10-CM

## 2011-04-15 DIAGNOSIS — R300 Dysuria: Secondary | ICD-10-CM

## 2011-04-15 LAB — POCT URINALYSIS DIP (DEVICE)
Bilirubin Urine: NEGATIVE
Glucose, UA: NEGATIVE mg/dL
Hgb urine dipstick: NEGATIVE
Ketones, ur: NEGATIVE mg/dL
Nitrite: NEGATIVE
Protein, ur: NEGATIVE mg/dL
Specific Gravity, Urine: 1.015 (ref 1.005–1.030)
Urobilinogen, UA: 1 mg/dL (ref 0.0–1.0)
pH: 6.5 (ref 5.0–8.0)

## 2011-04-16 NOTE — Discharge Summary (Signed)
NAMESHERITA, Cheryl Atkinson               ACCOUNT NO.:  [id number]   MEDICAL RECORD NO.:  [id number]          PATIENT TYPE:  INP   LOCATION:  5707                         FACILITY:  [facility]   PHYSICIAN:  Valerie A. Felicity Coyer, MDDATE OF BIRTH:  11-14-1931   DATE OF ADMISSION:  10/08/2007  DATE OF DISCHARGE:  10/13/2007                               DISCHARGE SUMMARY   DISCHARGE DIAGNOSES:  1. Sacral stress fracture with debilitating pain.  2. Hypertension.  3. Restless leg syndrome.  4. Osteoporosis.  5. Polymicrobial urinary tract infection.   HISTORY OF PRESENT ILLNESS:  Cheryl Atkinson is a 77 year old female who was  admitted on October 08, 2007 with chief complaint of back pain.  She  developed this back pain after picking up her [name] puppy and had  been seen in the [facility] Emergency Department on November 5 for back  pain after which she was discharged to home.  She was given a  prescription for Percocet.  She woke up 4:00 a.m. on the morning of  admission with worsening pain in the same location despite Percocet.  She returned to the ED and MRI showed a bilateral sacral fracture which  felt likely to reflect insufficiency fractures.  Chronic degenerative  disc disease was also noted without spinal stenosis.  She was admitted  for further evaluation due to debilitation and severe pain.   PAST MEDICAL HISTORY:  1. Hypertension.  2. Osteoporosis.  3. Restless leg syndrome.   COURSE OF HOSPITALIZATION:  1. Sacral stress fracture.  The patient was admitted and was seen by      physical therapy and occupational therapy.  Both teams felt that      the patient could benefit from a short-term skilled nursing      facility.  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  She did require titration upward of her pain medication      during his admission.  Please see med list below.  At this time,      patient will be transferred to skilled nursing facility.  2.  Urinary tract infection.  The patient was found to have a      polymicrobial urinary tract infection.  Followup urine culture was      negative.  She was treated with Cipro.   PHYSICAL EXAMINATION:  VITAL SIGNS:  BP 131/68, heart rate 72,  respiratory rate 20, temp 98.3, O2 sat 94% on two litters.  GENERAL:  Patient is an elderly white female who is awake and alert and  in no acute distress.  CARDIOVASCULAR:  S1, S2.  Regular rate and rhythm.  LUNGS:  Clear to auscultation bilaterally.  No wheezes, rales or  rhonchi.  ABDOMEN:  Soft, nontender and nondistended.  EXTREMITIES:  Show peripheral edema.  NEURO:  Patient is alert and oriented x4.  She is moving all  extremities.  Speech is clear.  She has positive facial symmetry.   MEDICATIONS AT TIME OF DISCHARGE:  1. Colace 100 mg p.o. b.i.d.  2. Requip 0.5 mg p.o.  q.h.Cheryl.  3. Avapro 300 mg p.o. daily.  4. Hydrochlorothiazide 12.5 mg p.o. daily.  5. Boniva 150 mg p.o. once monthly; currently on hold per pharmacy      protocol.  6. MiraLax 17 gm p.o. daily in 8 ounces of water.  7. MS Contin 45 mg p.o. t.i.d.   PERTINENT LABORATORIES AT TIME OF DISCHARGE:  November 7 urine culture  negative.  Hemoglobin 11.1, hematocrit 33.5, white blood cell count  10.9, platelets 320, BUN 19, creatinine 0.71, sodium 134, potassium 4.5.   DISPOSITION:  Patient will be discharged to skilled nursing facility.   FOLLOWUP:  Upon discharge and as needed she will need followup with her  primary care Cheryl Atkinson at [facility], Cheryl Atkinson.      Sandford Craze, NP      Raenette Rover. Felicity Coyer, MD  Electronically Signed    MO/MEDQ  D:  10/13/2007  T:  10/13/2007  Job:  [id number]   cc:   Cheryl Glaze, MD

## 2011-06-18 ENCOUNTER — Ambulatory Visit (INDEPENDENT_AMBULATORY_CARE_PROVIDER_SITE_OTHER): Payer: Medicare Other | Admitting: Internal Medicine

## 2011-06-18 ENCOUNTER — Encounter: Payer: Self-pay | Admitting: Internal Medicine

## 2011-06-18 VITALS — BP 136/80 | HR 76 | Temp 98.0°F | Resp 16 | Ht 66.0 in | Wt 178.0 lb

## 2011-06-18 DIAGNOSIS — M545 Low back pain, unspecified: Secondary | ICD-10-CM

## 2011-06-18 DIAGNOSIS — I10 Essential (primary) hypertension: Secondary | ICD-10-CM

## 2011-06-18 DIAGNOSIS — J4489 Other specified chronic obstructive pulmonary disease: Secondary | ICD-10-CM

## 2011-06-18 DIAGNOSIS — J449 Chronic obstructive pulmonary disease, unspecified: Secondary | ICD-10-CM

## 2011-06-18 DIAGNOSIS — D649 Anemia, unspecified: Secondary | ICD-10-CM

## 2011-06-18 DIAGNOSIS — G2581 Restless legs syndrome: Secondary | ICD-10-CM

## 2011-06-18 LAB — IBC PANEL
Iron: 47 ug/dL (ref 42–145)
Saturation Ratios: 11.8 % — ABNORMAL LOW (ref 20.0–50.0)
Transferrin: 284.6 mg/dL (ref 212.0–360.0)

## 2011-06-18 LAB — CBC WITH DIFFERENTIAL/PLATELET
Basophils Absolute: 0.1 10*3/uL (ref 0.0–0.1)
Basophils Relative: 0.6 % (ref 0.0–3.0)
Eosinophils Absolute: 0.4 10*3/uL (ref 0.0–0.7)
Eosinophils Relative: 3.8 % (ref 0.0–5.0)
HCT: 37.2 % (ref 36.0–46.0)
Hemoglobin: 12.3 g/dL (ref 12.0–15.0)
Lymphocytes Relative: 28.6 % (ref 12.0–46.0)
Lymphs Abs: 2.7 10*3/uL (ref 0.7–4.0)
MCHC: 33.1 g/dL (ref 30.0–36.0)
MCV: 89.5 fl (ref 78.0–100.0)
Monocytes Absolute: 0.8 10*3/uL (ref 0.1–1.0)
Monocytes Relative: 8.5 % (ref 3.0–12.0)
Neutro Abs: 5.4 10*3/uL (ref 1.4–7.7)
Neutrophils Relative %: 58.5 % (ref 43.0–77.0)
Platelets: 317 10*3/uL (ref 150.0–400.0)
RBC: 4.15 Mil/uL (ref 3.87–5.11)
RDW: 14 % (ref 11.5–14.6)
WBC: 9.3 10*3/uL (ref 4.5–10.5)

## 2011-06-18 MED ORDER — IRON POLYSACCH CMPLX-B12-FA 150-0.025-1 MG PO CAPS: 1.0000 | ORAL_CAPSULE | Freq: Two times a day (BID) | ORAL | Status: DC

## 2011-06-18 NOTE — Progress Notes (Signed)
Subjective:    Patient ID: Cheryl Atkinson, female    DOB: 1931-07-04, 77 y.o.   MRN: [id number]  HPI patient is a 77 year old white female who presents for followup.  She has not been able to exercise because of low back pain.  She states that the low back pain has improved recently her main complaint today is her restless legs they interfere with her ability to sleep and did not respond well to the current medications that she is taking.    Review of Systems  Constitutional: Negative for activity change, appetite change and fatigue.  HENT: Negative for ear pain, congestion, neck pain, postnasal drip and sinus pressure.   Eyes: Negative for redness and visual disturbance.  Respiratory: Negative for cough, shortness of breath and wheezing.   Gastrointestinal: Negative for abdominal pain and abdominal distention.  Genitourinary: Negative for dysuria, frequency and menstrual problem.  Musculoskeletal: Negative for myalgias, joint swelling and arthralgias.  Skin: Negative for rash and wound.  Neurological: Negative for dizziness, weakness and headaches.  Hematological: Negative for adenopathy. Does not bruise/bleed easily.  Psychiatric/Behavioral: Negative for sleep disturbance and decreased concentration.   Past Medical History  Diagnosis Date  . Thyroid disease   . Restless leg syndrome   . Osteoporosis   . Constipation   . Anemia    Past Surgical History  Procedure Date  . Appendectomy   . Abdominal hysterectomy   . Tonsillectomy   . Eye surgery   . Cataract extraction     reports that she has been smoking.  She does not have any smokeless tobacco history on file. She reports that she does not drink alcohol or use illicit drugs. family history includes Coronary artery disease in an unspecified family member and Heart disease in her father. Allergies  Allergen Reactions  . Codeine     REACTION: unspecified       Objective:   Physical Exam  Nursing note and vitals  reviewed. Constitutional: She is oriented to person, place, and time. She appears well-developed and well-nourished. No distress.  HENT:  Head: Normocephalic and atraumatic.  Right Ear: External ear normal.  Left Ear: External ear normal.  Nose: Nose normal.  Mouth/Throat: Oropharynx is clear and moist.  Eyes: Conjunctivae and EOM are normal. Pupils are equal, round, and reactive to light.  Neck: Normal range of motion. Neck supple. No JVD present. No tracheal deviation present. No thyromegaly present.  Cardiovascular: Normal rate, regular rhythm, normal heart sounds and intact distal pulses.   No murmur heard. Pulmonary/Chest: Effort normal and breath sounds normal. She has no wheezes. She exhibits no tenderness.  Abdominal: Soft. Bowel sounds are normal.  Musculoskeletal: Normal range of motion. She exhibits no edema and no tenderness.  Lymphadenopathy:    She has no cervical adenopathy.  Neurological: She is alert and oriented to person, place, and time. She has normal reflexes. No cranial nerve deficit.  Skin: Skin is warm and dry. She is not diaphoretic.  Psychiatric: She has a normal mood and affect. Her behavior is normal.          Assessment & Plan:  Her primary complaint is her restless legs or inability to sleep she has been on Requip 1 mg tablets. After review the literature for restless leg we will obtain a arterial Dopplers to make sure that blood flow to the leg is preserved begin tramadol 50 mg by mouth each bedtime on a regular basis in addition to the Requip begin iron replacement therapy  If this fails we'll consider adding gabapentin to the Requip

## 2011-08-20 ENCOUNTER — Ambulatory Visit (INDEPENDENT_AMBULATORY_CARE_PROVIDER_SITE_OTHER): Payer: Medicare Other | Admitting: Internal Medicine

## 2011-08-20 ENCOUNTER — Encounter: Payer: Self-pay | Admitting: Internal Medicine

## 2011-08-20 VITALS — BP 130/80 | HR 76 | Temp 98.2°F | Resp 16 | Ht 66.0 in | Wt 178.0 lb

## 2011-08-20 DIAGNOSIS — Z23 Encounter for immunization: Secondary | ICD-10-CM

## 2011-08-20 DIAGNOSIS — I10 Essential (primary) hypertension: Secondary | ICD-10-CM

## 2011-08-20 DIAGNOSIS — R259 Unspecified abnormal involuntary movements: Secondary | ICD-10-CM

## 2011-08-20 DIAGNOSIS — J4489 Other specified chronic obstructive pulmonary disease: Secondary | ICD-10-CM

## 2011-08-20 DIAGNOSIS — J449 Chronic obstructive pulmonary disease, unspecified: Secondary | ICD-10-CM

## 2011-08-20 DIAGNOSIS — G2581 Restless legs syndrome: Secondary | ICD-10-CM

## 2011-08-20 DIAGNOSIS — IMO0002 Reserved for concepts with insufficient information to code with codable children: Secondary | ICD-10-CM

## 2011-08-20 DIAGNOSIS — K219 Gastro-esophageal reflux disease without esophagitis: Secondary | ICD-10-CM

## 2011-08-22 LAB — POCT URINALYSIS DIP (DEVICE)
Bilirubin Urine: NEGATIVE
Glucose, UA: NEGATIVE
Ketones, ur: NEGATIVE
Nitrite: POSITIVE — AB
Operator id: 126491
Protein, ur: 100 — AB
Specific Gravity, Urine: 1.02
Urobilinogen, UA: 1
pH: 7

## 2011-08-22 LAB — URINE CULTURE: Colony Count: >=100000

## 2011-09-03 LAB — POCT URINALYSIS DIP (DEVICE)
Bilirubin Urine: NEGATIVE
Glucose, UA: NEGATIVE mg/dL
Hgb urine dipstick: NEGATIVE
Ketones, ur: NEGATIVE mg/dL
Nitrite: POSITIVE — AB
Protein, ur: NEGATIVE mg/dL
Specific Gravity, Urine: 1.015 (ref 1.005–1.030)
Urobilinogen, UA: 0.2 mg/dL (ref 0.0–1.0)
pH: 5.5 (ref 5.0–8.0)

## 2011-09-03 LAB — URINE CULTURE
Colony Count: NO GROWTH
Culture: NO GROWTH

## 2011-09-10 LAB — BASIC METABOLIC PANEL
BUN: 17
BUN: 19
CO2: 27
CO2: 31
Calcium: 8.3 — ABNORMAL LOW
Calcium: 8.4
Chloride: 104
Chloride: 98
Creatinine, Ser: 0.62
Creatinine, Ser: 0.71
GFR calc Af Amer: >60
GFR calc Af Amer: >60
GFR calc non Af Amer: >60
GFR calc non Af Amer: >60
Glucose, Bld: 122 — ABNORMAL HIGH
Glucose, Bld: 91
Potassium: 4
Potassium: 4.5
Sodium: 134 — ABNORMAL LOW
Sodium: 138

## 2011-09-10 LAB — URINE MICROSCOPIC-ADD ON

## 2011-09-10 LAB — URINALYSIS, ROUTINE W REFLEX MICROSCOPIC
Bilirubin Urine: NEGATIVE
Bilirubin Urine: NEGATIVE
Glucose, UA: NEGATIVE
Glucose, UA: NEGATIVE
Hgb urine dipstick: NEGATIVE
Ketones, ur: NEGATIVE
Ketones, ur: NEGATIVE
Nitrite: NEGATIVE
Nitrite: NEGATIVE
Protein, ur: NEGATIVE
Protein, ur: NEGATIVE
Specific Gravity, Urine: 1.008
Specific Gravity, Urine: 1.028
Urobilinogen, UA: 0.2
Urobilinogen, UA: 1
pH: 6
pH: 7

## 2011-09-10 LAB — CBC
HCT: 32.1 — ABNORMAL LOW
HCT: 33.5 — ABNORMAL LOW
Hemoglobin: 10.7 — ABNORMAL LOW
Hemoglobin: 11.1 — ABNORMAL LOW
MCHC: 33.2
MCHC: 33.4
MCV: 87.1
MCV: 88.1
Platelets: 320
Platelets: 350
RBC: 3.68 — ABNORMAL LOW
RBC: 3.8 — ABNORMAL LOW
RDW: 13.8
RDW: 13.9
WBC: 10.9 — ABNORMAL HIGH
WBC: 11.2 — ABNORMAL HIGH

## 2011-09-10 LAB — POCT URINALYSIS DIP (DEVICE)
Bilirubin Urine: NEGATIVE
Glucose, UA: NEGATIVE
Hgb urine dipstick: NEGATIVE
Ketones, ur: NEGATIVE
Nitrite: NEGATIVE
Operator id: 247071
Protein, ur: NEGATIVE
Specific Gravity, Urine: 1.015
Urobilinogen, UA: 0.2
pH: 7

## 2011-09-10 LAB — URINE CULTURE
Colony Count: 50000
Colony Count: NO GROWTH
Culture: NO GROWTH
Special Requests: NEGATIVE

## 2011-09-10 LAB — DIFFERENTIAL
Basophils Absolute: 0.1
Basophils Relative: 1
Eosinophils Absolute: 0.2
Eosinophils Relative: 2
Lymphocytes Relative: 40
Lymphs Abs: 4.5 — ABNORMAL HIGH
Monocytes Absolute: 0.9 — ABNORMAL HIGH
Monocytes Relative: 8
Neutro Abs: 5.4
Neutrophils Relative %: 48

## 2011-09-12 LAB — POCT URINALYSIS DIP (DEVICE)
Glucose, UA: NEGATIVE
Ketones, ur: NEGATIVE
Nitrite: POSITIVE — AB
Operator id: 126491
Protein, ur: >=300 — AB
Specific Gravity, Urine: 1.02
Urobilinogen, UA: 0.2
pH: 6

## 2011-09-12 LAB — URINE CULTURE: Colony Count: >=100000

## 2011-09-24 ENCOUNTER — Encounter: Payer: Self-pay | Admitting: Family Medicine

## 2011-09-24 ENCOUNTER — Ambulatory Visit (INDEPENDENT_AMBULATORY_CARE_PROVIDER_SITE_OTHER): Payer: Medicare Other | Admitting: Family Medicine

## 2011-09-24 VITALS — BP 116/68 | HR 98 | Temp 98.1°F | Wt 165.0 lb

## 2011-09-24 DIAGNOSIS — N390 Urinary tract infection, site not specified: Secondary | ICD-10-CM

## 2011-09-24 LAB — POCT URINALYSIS DIPSTICK
Blood, UA: NEGATIVE
Glucose, UA: NEGATIVE
Ketones, UA: NEGATIVE
Leukocytes, UA: NEGATIVE
Nitrite, UA: NEGATIVE
Protein, UA: NEGATIVE
Spec Grav, UA: 1.02
Urobilinogen, UA: 0.2
pH, UA: 6

## 2011-09-24 MED ORDER — CIPROFLOXACIN HCL 500 MG PO TABS: 500.0000 mg | ORAL_TABLET | Freq: Two times a day (BID) | ORAL | Status: AC

## 2011-09-24 NOTE — Progress Notes (Signed)
  Subjective:    Patient ID: Cheryl Atkinson, female    DOB: 1931/11/06, 77 y.o.   MRN: [id number]  HPI Here for 3 weeks of intermittent nausea without vomiting and lower abdominal cramps. No change in BMs. No fever. Drinking plenty of water.    Review of Systems  Constitutional: Negative.   HENT: Negative.   Respiratory: Negative.   Cardiovascular: Negative.   Gastrointestinal: Positive for nausea and abdominal pain. Negative for vomiting, diarrhea, constipation, blood in stool and abdominal distention.  Genitourinary: Positive for urgency and frequency. Negative for dysuria.       Objective:   Physical Exam  Constitutional: She appears well-developed and well-nourished.  Neck: Neck supple. No thyromegaly present.  Cardiovascular: Normal rate, regular rhythm, normal heart sounds and intact distal pulses.   Pulmonary/Chest: Effort normal and breath sounds normal.  Abdominal: Soft. Bowel sounds are normal. She exhibits no distension and no mass. There is no rebound and no guarding.       Mildly tender in both lower quadrants  Lymphadenopathy:    She has no cervical adenopathy.          Assessment & Plan:  This is probably another UTI, treat with Cipro. Recheck prn

## 2011-09-24 NOTE — Progress Notes (Signed)
Addended by: Aniceto Boss A on: 09/24/2011 01:35 PM   Modules accepted: Orders

## 2011-10-05 NOTE — Progress Notes (Signed)
System Downtime Recovery The EMR experienced a system downtime.  This downtime occurred on 08-20-2011. During this downtime paper charting was completed by the provider.  The visit was documented on paper during the downtime and will be scanned into [facility], billing was completed by the [facility] Department .  The visit is being closed on behalf of the provider. 

## 2011-10-14 ENCOUNTER — Emergency Department (INDEPENDENT_AMBULATORY_CARE_PROVIDER_SITE_OTHER)
Admission: EM | Admit: 2011-10-14 | Discharge: 2011-10-14 | Disposition: A | Discharge status: Home / Self Care | Payer: Medicare Other | Source: Home / Self Care | Attending: Family Medicine | Admitting: Family Medicine

## 2011-10-14 ENCOUNTER — Encounter (HOSPITAL_COMMUNITY): Payer: Self-pay

## 2011-10-14 DIAGNOSIS — B351 Tinea unguium: Secondary | ICD-10-CM

## 2011-10-14 DIAGNOSIS — L600 Ingrowing nail: Secondary | ICD-10-CM

## 2011-10-14 MED ORDER — CEPHALEXIN 500 MG PO CAPS: 500.0000 mg | ORAL_CAPSULE | Freq: Four times a day (QID) | ORAL | Status: AC

## 2011-10-14 NOTE — ED Provider Notes (Signed)
History     CSN: [id number] Arrival date & time: 10/14/2011  8:14 AM   First MD Initiated Contact with Patient 10/14/11 0813      Chief Complaint  Patient presents with  . Toe Pain    pt. states has had pain in left great toe for 2 weeks.  thought it was an ingrown toenail, however the pain is still there after pt. tried to "cut it out"    (Consider location/radiation/quality/duration/timing/severity/associated sxs/prior treatment) Patient is a 77 y.o. female presenting with toe pain. The history is provided by the patient.  Toe Pain This is a new problem. Episode onset: 2 weeks. The problem occurs constantly. The problem has not changed since onset.The symptoms are aggravated by walking. The symptoms are relieved by nothing.    Past Medical History  Diagnosis Date  . Thyroid disease   . Restless leg syndrome   . Osteoporosis   . Constipation   . Anemia   . Hypertension     Past Surgical History  Procedure Date  . Appendectomy   . Abdominal hysterectomy   . Tonsillectomy   . Eye surgery   . Cataract extraction     Family History  Problem Relation Age of Onset  . Coronary artery disease    . Heart disease Father     History  Substance Use Topics  . Smoking status: Current Some Day Smoker -- 0.5 packs/day    Types: Cigarettes  . Smokeless tobacco: Never Used  . Alcohol Use: No    OB History    Grav Para Term Preterm Abortions TAB SAB Ect Mult Living                  Review of Systems  Constitutional: Negative.   HENT: Negative.   Respiratory: Negative.   Cardiovascular: Negative.   Gastrointestinal: Negative.     Allergies  Codeine  Home Medications   Current Outpatient Rx  Name Route Sig Dispense Refill  . CALCIUM CARBONATE-VITAMIN D 500-200 MG-UNIT PO TABS Oral Take 1 tablet by mouth daily.      . DEXLANSOPRAZOLE 60 MG PO CPDR Oral Take 60 mg by mouth daily.      . ERGOCALCIFEROL 50000 UNITS PO CAPS Oral Take 50,000 Units by mouth 2 (two)  times a week.      . IRON POLYSACCH CMPLX-B12-FA 150-0.025-1 MG PO CAPS Oral Take 1 capsule by mouth 2 (two) times daily after a meal. 60 each 3  . MOMETASONE FURO-FORMOTEROL FUM 100-5 MCG/ACT IN AERO Inhalation Inhale 2 puffs into the lungs 2 (two) times daily.      Marland Kitchen OLMESARTAN-AMLODIPINE-HCTZ 40-10-12.5 MG PO TABS Oral Take by mouth daily.      Marland Kitchen MIRALAX PO Oral Take by mouth daily.      Marland Kitchen ROPINIROLE HCL 1 MG PO TABS Oral Take 1 mg by mouth at bedtime.      . TRAMADOL HCL 50 MG PO TABS Oral Take 50 mg by mouth every 6 (six) hours as needed.      . CEPHALEXIN 500 MG PO CAPS Oral Take 1 capsule (500 mg total) by mouth 4 (four) times daily. 40 capsule 0    BP 123/78  Pulse 73  Temp(Src) 97.8 F (36.6 C) (Oral)  Resp 20  SpO2 96%  Physical Exam  Nursing note and vitals reviewed. Constitutional: She appears well-developed and well-nourished. She appears distressed.  Cardiovascular: Normal rate and regular rhythm.   Pulmonary/Chest: Effort normal.  Musculoskeletal:  Evaluation of the left great toe reveals swelling and erythema. Has onychomycosis and the nail is curling downward on each side. Tender to touch. No definite pus pocket  Skin: Skin is warm and dry.    ED Course  Procedures (including critical care time)  Labs Reviewed - No data to display No results found.   1. Ingrown left big toenail   2. Onychomycosis       MDM          Randa Spike, MD 10/14/11 [id number]

## 2011-10-14 NOTE — ED Notes (Signed)
Pt. States that she has had pain in her left great toe for 2 weeks.  Thought it was an ingrown toenail, however after she tried to "cut it out" the pain is still there.  Noted toe is red and swollen.  Good pulses noted and good  Capillary refill.

## 2011-11-05 ENCOUNTER — Other Ambulatory Visit: Payer: Self-pay | Admitting: Internal Medicine

## 2011-11-19 ENCOUNTER — Ambulatory Visit (INDEPENDENT_AMBULATORY_CARE_PROVIDER_SITE_OTHER): Payer: Medicare Other | Admitting: Internal Medicine

## 2011-11-19 ENCOUNTER — Encounter: Payer: Self-pay | Admitting: Internal Medicine

## 2011-11-19 VITALS — BP 140/80 | HR 72 | Temp 98.2°F | Resp 16 | Ht 66.0 in | Wt 160.0 lb

## 2011-11-19 DIAGNOSIS — G2581 Restless legs syndrome: Secondary | ICD-10-CM

## 2011-11-19 DIAGNOSIS — M545 Low back pain, unspecified: Secondary | ICD-10-CM

## 2011-11-19 DIAGNOSIS — J449 Chronic obstructive pulmonary disease, unspecified: Secondary | ICD-10-CM

## 2011-11-19 DIAGNOSIS — D509 Iron deficiency anemia, unspecified: Secondary | ICD-10-CM

## 2011-11-19 DIAGNOSIS — I10 Essential (primary) hypertension: Secondary | ICD-10-CM

## 2011-11-19 LAB — CBC WITH DIFFERENTIAL/PLATELET
Basophils Absolute: 0 10*3/uL (ref 0.0–0.1)
Basophils Relative: 0.4 % (ref 0.0–3.0)
Eosinophils Absolute: 0.5 10*3/uL (ref 0.0–0.7)
Eosinophils Relative: 5.4 % — ABNORMAL HIGH (ref 0.0–5.0)
HCT: 36.2 % (ref 36.0–46.0)
Hemoglobin: 12.1 g/dL (ref 12.0–15.0)
Lymphocytes Relative: 35.5 % (ref 12.0–46.0)
Lymphs Abs: 3 10*3/uL (ref 0.7–4.0)
MCHC: 33.3 g/dL (ref 30.0–36.0)
MCV: 89.3 fl (ref 78.0–100.0)
Monocytes Absolute: 0.6 10*3/uL (ref 0.1–1.0)
Monocytes Relative: 7.2 % (ref 3.0–12.0)
Neutro Abs: 4.4 10*3/uL (ref 1.4–7.7)
Neutrophils Relative %: 51.5 % (ref 43.0–77.0)
Platelets: 311 10*3/uL (ref 150.0–400.0)
RBC: 4.05 Mil/uL (ref 3.87–5.11)
RDW: 13.8 % (ref 11.5–14.6)
WBC: 8.5 10*3/uL (ref 4.5–10.5)

## 2011-11-19 LAB — IRON: Iron: 67 ug/dL (ref 42–145)

## 2011-11-19 LAB — BASIC METABOLIC PANEL
BUN: 15 mg/dL (ref 6–23)
CO2: 27 mEq/L (ref 19–32)
Calcium: 9 mg/dL (ref 8.4–10.5)
Chloride: 104 mEq/L (ref 96–112)
Creatinine, Ser: 0.8 mg/dL (ref 0.4–1.2)
GFR: 70.62 mL/min (ref >60.00)
Glucose, Bld: 98 mg/dL (ref 70–99)
Potassium: 3.8 mEq/L (ref 3.5–5.1)
Sodium: 139 mEq/L (ref 135–145)

## 2011-11-19 MED ORDER — ROTIGOTINE 2 MG/24HR TD PT24: 1.0000 | MEDICATED_PATCH | Freq: Every day | TRANSDERMAL | Status: DC

## 2011-11-19 NOTE — Patient Instructions (Signed)
Put the restless leg patch on daily after your shower

## 2011-11-20 NOTE — Progress Notes (Signed)
  Subjective:    Patient ID: Cheryl Atkinson, female    DOB: 1931/09/20, 77 y.o.   MRN: [id number]  HPI Patient is a 77 year old white female who presents for routine followup for hypertension gastroesophageal reflux and a chief complaint of worsening restless legs she states that the restless leg symptoms are now occurring during the day as well as at night interferes significantly with her sleep.  Has a history of iron deficiency anemia as well that may contribute to restless leg syndrome   Review of Systems  Constitutional: Negative for activity change, appetite change and fatigue.  HENT: Negative for ear pain, congestion, neck pain, postnasal drip and sinus pressure.   Eyes: Negative for redness and visual disturbance.  Respiratory: Negative for cough, shortness of breath and wheezing.   Gastrointestinal: Negative for abdominal pain and abdominal distention.  Genitourinary: Negative for dysuria, frequency and menstrual problem.  Musculoskeletal: Positive for myalgias and arthralgias. Negative for joint swelling.  Skin: Negative for rash and wound.  Neurological: Negative for dizziness, weakness and headaches.  Hematological: Negative for adenopathy. Does not bruise/bleed easily.  Psychiatric/Behavioral: Negative for sleep disturbance and decreased concentration.       Objective:   Physical Exam  Nursing note and vitals reviewed. Constitutional: She is oriented to person, place, and time. She appears well-developed and well-nourished. No distress.  HENT:  Head: Normocephalic and atraumatic.  Right Ear: External ear normal.  Left Ear: External ear normal.  Nose: Nose normal.  Mouth/Throat: Oropharynx is clear and moist.  Eyes: Conjunctivae and EOM are normal. Pupils are equal, round, and reactive to light.  Neck: Normal range of motion. Neck supple. No JVD present. No tracheal deviation present. No thyromegaly present.  Cardiovascular: Normal rate, regular rhythm, normal heart  sounds and intact distal pulses.   No murmur heard. Pulmonary/Chest: Effort normal and breath sounds normal. She has no wheezes. She exhibits no tenderness.  Abdominal: Soft. Bowel sounds are normal.  Musculoskeletal: Normal range of motion. She exhibits no edema and no tenderness.  Lymphadenopathy:    She has no cervical adenopathy.  Neurological: She is alert and oriented to person, place, and time. She has normal reflexes. No cranial nerve deficit.  Skin: Skin is warm and dry. She is not diaphoretic.  Psychiatric: She has a normal mood and affect. Her behavior is normal.          Assessment & Plan:  We will start her on patches for her restless leg that would distribute medicine evenly over 24 hours to see if this will help with her symptomatology if this fails referral to neurology would be the next.  Her GERD is stable on current medications blood pressure is stable she continues to take the complex of the CBC and differential and normal should be measured

## 2012-01-02 DIAGNOSIS — M545 Low back pain, unspecified: Secondary | ICD-10-CM | POA: Diagnosis not present

## 2012-01-02 DIAGNOSIS — M47817 Spondylosis without myelopathy or radiculopathy, lumbosacral region: Secondary | ICD-10-CM | POA: Diagnosis not present

## 2012-01-02 DIAGNOSIS — Z5181 Encounter for therapeutic drug level monitoring: Secondary | ICD-10-CM | POA: Diagnosis not present

## 2012-02-17 ENCOUNTER — Encounter: Payer: Self-pay | Admitting: Internal Medicine

## 2012-02-17 ENCOUNTER — Ambulatory Visit (INDEPENDENT_AMBULATORY_CARE_PROVIDER_SITE_OTHER): Payer: Medicare Other | Admitting: Internal Medicine

## 2012-02-17 VITALS — BP 132/80 | HR 72 | Temp 98.0°F | Resp 16 | Ht 66.0 in | Wt 158.0 lb

## 2012-02-17 DIAGNOSIS — J449 Chronic obstructive pulmonary disease, unspecified: Secondary | ICD-10-CM

## 2012-02-17 DIAGNOSIS — K219 Gastro-esophageal reflux disease without esophagitis: Secondary | ICD-10-CM

## 2012-02-17 DIAGNOSIS — G2581 Restless legs syndrome: Secondary | ICD-10-CM | POA: Diagnosis not present

## 2012-02-17 DIAGNOSIS — I10 Essential (primary) hypertension: Secondary | ICD-10-CM

## 2012-02-17 NOTE — Patient Instructions (Signed)
The patient is instructed to continue all medications as prescribed. Schedule followup with check out clerk upon leaving the clinic  

## 2012-02-17 NOTE — Progress Notes (Signed)
  Subjective:    Patient ID: Cheryl Atkinson, female    DOB: [birth year], 78 y.o.   MRN: [id number]  Hypertension Pertinent negatives include no headaches, neck pain or shortness of breath.  Gastrophageal Reflux She reports no abdominal pain, no coughing or no wheezing. Pertinent negatives include no fatigue.  Anemia There has been no abdominal pain or bruising/bleeding easily.   Patient is a 78 year old female. Presents for followup of hypertension gastroesophageal reflux a history of asthma no history of anemia and most recently a change to patches for her restless leg syndrome.  She reports with quite a lot of enthusiasm that her restless leg improved dramatically to the 2 mg patch. The rest of her problems are stable with the exception of her gastroesophageal reflux which has worsened during the night she sometimes awakes during the night with severe reflux and has to take Maalox.  She is no longer on a proton pump inhibitor and we believe that we will have to resume the use of prilosec   Review of Systems  Constitutional: Negative for activity change, appetite change and fatigue.  HENT: Negative for ear pain, congestion, neck pain, postnasal drip and sinus pressure.   Eyes: Negative for redness and visual disturbance.  Respiratory: Negative for cough, shortness of breath and wheezing.   Gastrointestinal: Negative for abdominal pain and abdominal distention.  Genitourinary: Negative for dysuria, frequency and menstrual problem.  Musculoskeletal: Negative for myalgias, joint swelling and arthralgias.  Skin: Negative for rash and wound.  Neurological: Negative for dizziness, weakness and headaches.  Hematological: Negative for adenopathy. Does not bruise/bleed easily.  Psychiatric/Behavioral: Negative for sleep disturbance and decreased concentration.       Objective:   Physical Exam  Constitutional: She is oriented to person, place, and time. She appears well-developed and  well-nourished. No distress.  HENT:  Head: Normocephalic and atraumatic.  Right Ear: External ear normal.  Left Ear: External ear normal.  Nose: Nose normal.  Mouth/Throat: Oropharynx is clear and moist.  Eyes: Conjunctivae and EOM are normal. Pupils are equal, round, and reactive to light.  Neck: Normal range of motion. Neck supple. No JVD present. No tracheal deviation present. No thyromegaly present.  Cardiovascular: Normal rate, regular rhythm, normal heart sounds and intact distal pulses.   No murmur heard. Pulmonary/Chest: Effort normal and breath sounds normal. She has no wheezes. She exhibits no tenderness.  Abdominal: Soft. Bowel sounds are normal.  Musculoskeletal: Normal range of motion. She exhibits no edema and no tenderness.  Lymphadenopathy:    She has no cervical adenopathy.  Neurological: She is alert and oriented to person, place, and time. She has normal reflexes. No cranial nerve deficit.  Skin: Skin is warm and dry. She is not diaphoretic.  Psychiatric: She has a normal mood and affect. Her behavior is normal.          Assessment & Plan:  Severe GERD with nighttime awakening was in Prilosec 20 mg by mouth each bedtime.  History of iron deficiency anemia monitor CBC differential history of hypertension stable on current medications history of restless leg syndrome improved with the use of the restless leg patches continue the patches samples given to a compliance

## 2012-03-30 DIAGNOSIS — M47817 Spondylosis without myelopathy or radiculopathy, lumbosacral region: Secondary | ICD-10-CM | POA: Diagnosis not present

## 2012-03-30 DIAGNOSIS — M545 Low back pain, unspecified: Secondary | ICD-10-CM | POA: Diagnosis not present

## 2012-03-30 DIAGNOSIS — IMO0001 Reserved for inherently not codable concepts without codable children: Secondary | ICD-10-CM | POA: Diagnosis not present

## 2012-04-13 ENCOUNTER — Telehealth: Payer: Self-pay | Admitting: Internal Medicine

## 2012-04-13 NOTE — Telephone Encounter (Signed)
Pt requesting samples of TRIBENZOR

## 2012-04-14 ENCOUNTER — Other Ambulatory Visit: Payer: Self-pay | Admitting: *Deleted

## 2012-04-14 MED ORDER — HYDROCHLOROTHIAZIDE 12.5 MG PO CAPS: 12.5000 mg | ORAL_CAPSULE | Freq: Every day | ORAL | Status: DC

## 2012-04-14 NOTE — Telephone Encounter (Signed)
No tribenzor 40-10 12.5 avaiable- per dr Lovell Sheehan gave azor 40-10 and called in hctz 12.5 will take qd- pt informed and voiced understanding

## 2012-05-19 ENCOUNTER — Ambulatory Visit: Payer: Medicare Other | Admitting: Internal Medicine

## 2012-05-19 DIAGNOSIS — H35369 Drusen (degenerative) of macula, unspecified eye: Secondary | ICD-10-CM | POA: Diagnosis not present

## 2012-05-19 DIAGNOSIS — H04129 Dry eye syndrome of unspecified lacrimal gland: Secondary | ICD-10-CM | POA: Diagnosis not present

## 2012-05-19 DIAGNOSIS — H43399 Other vitreous opacities, unspecified eye: Secondary | ICD-10-CM | POA: Diagnosis not present

## 2012-05-20 ENCOUNTER — Ambulatory Visit (INDEPENDENT_AMBULATORY_CARE_PROVIDER_SITE_OTHER): Payer: Medicare Other | Admitting: Internal Medicine

## 2012-05-20 ENCOUNTER — Encounter: Payer: Self-pay | Admitting: Internal Medicine

## 2012-05-20 VITALS — BP 134/80 | HR 72 | Temp 98.6°F | Resp 16 | Ht 66.0 in | Wt 160.0 lb

## 2012-05-20 DIAGNOSIS — G2581 Restless legs syndrome: Secondary | ICD-10-CM

## 2012-05-20 DIAGNOSIS — K219 Gastro-esophageal reflux disease without esophagitis: Secondary | ICD-10-CM

## 2012-05-20 DIAGNOSIS — I10 Essential (primary) hypertension: Secondary | ICD-10-CM

## 2012-05-20 MED ORDER — OLMESARTAN-AMLODIPINE-HCTZ 40-10-12.5 MG PO TABS: 1.0000 | ORAL_TABLET | Freq: Every day | ORAL | Status: DC

## 2012-05-20 MED ORDER — ESOMEPRAZOLE MAGNESIUM 40 MG PO CPDR: 40.0000 mg | DELAYED_RELEASE_CAPSULE | Freq: Every day | ORAL | Status: DC

## 2012-05-20 NOTE — Progress Notes (Signed)
Subjective:    Patient ID: Cheryl Atkinson, female    DOB: 1931-03-21, 78 y.o.   MRN: [id number]  HPI This is a 78 year old woman who is followed for hypertension hypothyroidism and restless leg syndrome she is on the patches for restless leg and doing well.  Her blood pressure is stable today she has a history of iron deficiency anemia and we should measure CBC differential and a basic metabolic panel.  She has a history of mild COPD stable on the lateral twice daily.  She takes tramadol as needed for pain. Her PPI was Nexium but she ran out of the medications that she had increased symptoms of reflux   Review of Systems  Constitutional: Negative for activity change, appetite change and fatigue.  HENT: Negative for ear pain, congestion, neck pain, postnasal drip and sinus pressure.   Eyes: Negative for redness and visual disturbance.  Respiratory: Negative for cough, shortness of breath and wheezing.   Gastrointestinal: Negative for abdominal pain and abdominal distention.       Reflux symptoms  Genitourinary: Negative for dysuria, frequency and menstrual problem.  Musculoskeletal: Negative for myalgias, joint swelling and arthralgias.  Skin: Negative for rash and wound.  Neurological: Negative for dizziness, weakness and headaches.  Hematological: Negative for adenopathy. Does not bruise/bleed easily.  Psychiatric/Behavioral: Negative for disturbed wake/sleep cycle and decreased concentration.   Past Medical History  Diagnosis Date  . Thyroid disease   . Restless leg syndrome   . Osteoporosis   . Constipation   . Anemia   . Hypertension     History   Social History  . Marital Status: Married    Spouse Name: N/A    Number of Children: N/A  . Years of Education: N/A   Occupational History  . Not on file.   Social History Main Topics  . Smoking status: Current Some Day Smoker -- 0.5 packs/day    Types: Cigarettes  . Smokeless tobacco: Never Used  . Alcohol Use: No    . Drug Use: No  . Sexually Active: No   Other Topics Concern  . Not on file   Social History Narrative  . No narrative on file    Past Surgical History  Procedure Date  . Appendectomy   . Abdominal hysterectomy   . Tonsillectomy   . Eye surgery   . Cataract extraction     Family History  Problem Relation Age of Onset  . Coronary artery disease    . Heart disease Father     Allergies  Allergen Reactions  . Codeine     REACTION: unspecified    Current Outpatient Prescriptions on File Prior to Visit  Medication Sig Dispense Refill  . esomeprazole (NEXIUM) 40 MG capsule Take 1 capsule (40 mg total) by mouth daily before breakfast.  30 capsule  11  . calcium-vitamin D (OSCAL WITH D) 500-200 MG-UNIT per tablet Take 1 tablet by mouth daily.        . ergocalciferol (VITAMIN D2) 50000 UNITS capsule Take 50,000 Units by mouth 2 (two) times a week.        . hydrochlorothiazide (MICROZIDE) 12.5 MG capsule Take 1 capsule (12.5 mg total) by mouth daily.  30 capsule  6  . Mometasone Furo-Formoterol Fum (DULERA) 100-5 MCG/ACT AERO Inhale 2 puffs into the lungs 2 (two) times daily.        . Polyethylene Glycol 3350 (MIRALAX PO) Take by mouth daily.        . rotigotine (NEUPRO)  2 MG/24HR Place 1 patch onto the skin daily.  30 patch  12  . traMADol (ULTRAM) 50 MG tablet Take 50 mg by mouth every 6 (six) hours as needed.        Marland Kitchen DISCONTD: Olmesartan-Amlodipine-HCTZ (TRIBENZOR) 40-10-12.5 MG TABS Take by mouth daily.          BP 134/80  Pulse 72  Temp 98.6 F (37 C)  Resp 16  Ht 5\' 6"  (1.676 m)  Wt 160 lb (72.576 kg)  BMI 25.82 kg/m2       Objective:   Physical Exam  Nursing note and vitals reviewed. Constitutional: She is oriented to person, place, and time. She appears well-developed and well-nourished. No distress.  HENT:  Head: Normocephalic and atraumatic.  Right Ear: External ear normal.  Left Ear: External ear normal.  Nose: Nose normal.  Mouth/Throat: Oropharynx  is clear and moist.  Eyes: Conjunctivae and EOM are normal. Pupils are equal, round, and reactive to light.  Neck: Normal range of motion. Neck supple. No JVD present. No tracheal deviation present. No thyromegaly present.  Cardiovascular: Normal rate, regular rhythm, normal heart sounds and intact distal pulses.   No murmur heard. Pulmonary/Chest: Effort normal and breath sounds normal. She has no wheezes. She exhibits no tenderness.  Abdominal: Soft. Bowel sounds are normal.  Musculoskeletal: Normal range of motion. She exhibits no edema and no tenderness.  Lymphadenopathy:    She has no cervical adenopathy.  Neurological: She is alert and oriented to person, place, and time. She has normal reflexes. No cranial nerve deficit.  Skin: Skin is warm and dry. She is not diaphoretic.  Psychiatric: She has a normal mood and affect. Her behavior is normal.          Assessment & Plan:  We will give her samples of Nexium I recommended she take it however every other day rather than every day to help protect her bone structure as well as not appear with medications.  We will continue tribenzor as a primary intervention for her htn which is stable. Stable restless legs We dicussed walking and exercise.  Diet and carbohydrate.

## 2012-05-20 NOTE — Patient Instructions (Addendum)
The patient is instructed to continue all medications as prescribed. Schedule followup with check out clerk upon leaving the clinic  

## 2012-05-27 DIAGNOSIS — M47817 Spondylosis without myelopathy or radiculopathy, lumbosacral region: Secondary | ICD-10-CM | POA: Diagnosis not present

## 2012-05-27 DIAGNOSIS — M545 Low back pain, unspecified: Secondary | ICD-10-CM | POA: Diagnosis not present

## 2012-08-27 DIAGNOSIS — M545 Low back pain, unspecified: Secondary | ICD-10-CM | POA: Diagnosis not present

## 2012-08-27 DIAGNOSIS — M47817 Spondylosis without myelopathy or radiculopathy, lumbosacral region: Secondary | ICD-10-CM | POA: Diagnosis not present

## 2012-08-28 DIAGNOSIS — M545 Low back pain, unspecified: Secondary | ICD-10-CM | POA: Diagnosis not present

## 2012-08-28 DIAGNOSIS — Z79899 Other long term (current) drug therapy: Secondary | ICD-10-CM | POA: Diagnosis not present

## 2012-09-01 DIAGNOSIS — M47817 Spondylosis without myelopathy or radiculopathy, lumbosacral region: Secondary | ICD-10-CM | POA: Diagnosis not present

## 2012-09-18 ENCOUNTER — Ambulatory Visit: Payer: Medicare Other | Admitting: Internal Medicine

## 2012-09-29 ENCOUNTER — Ambulatory Visit (INDEPENDENT_AMBULATORY_CARE_PROVIDER_SITE_OTHER): Payer: Medicare Other | Admitting: Internal Medicine

## 2012-09-29 ENCOUNTER — Encounter: Payer: Self-pay | Admitting: Internal Medicine

## 2012-09-29 VITALS — BP 140/80 | HR 72 | Temp 98.2°F | Resp 16 | Ht 66.0 in | Wt 160.0 lb

## 2012-09-29 DIAGNOSIS — M545 Low back pain, unspecified: Secondary | ICD-10-CM

## 2012-09-29 DIAGNOSIS — Z23 Encounter for immunization: Secondary | ICD-10-CM | POA: Diagnosis not present

## 2012-09-29 DIAGNOSIS — I10 Essential (primary) hypertension: Secondary | ICD-10-CM | POA: Diagnosis not present

## 2012-09-29 DIAGNOSIS — M48061 Spinal stenosis, lumbar region without neurogenic claudication: Secondary | ICD-10-CM

## 2012-09-29 MED ORDER — OLMESARTAN-AMLODIPINE-HCTZ 40-10-12.5 MG PO TABS: 1.0000 | ORAL_TABLET | Freq: Every day | ORAL | Status: DC

## 2012-09-29 MED ORDER — METHYLPREDNISOLONE (PAK) 4 MG PO TABS: ORAL_TABLET | ORAL | Status: DC

## 2012-09-29 NOTE — Progress Notes (Signed)
Subjective:    Patient ID: Cheryl Atkinson, female    DOB: 03/04/1931, 78 y.o.   MRN: [id number]  HPI . Seeing multiple joint pain in the cervical region radiating into her shoulders in the lumbar region radiating into her back she has a previous history of multiple joint degenerative disease in the back that was documented by an MRI and a CT in 2011 and she is an ongoing followup visit planned with a orthopedist.  She has been on Ultram for pain control but is not currently on any anti-inflammatory.  We will do a diagnostic burst and taper of prednisone to see whether her pain responds to an anti-inflammatory burst and taper which may help in terms of diagnosis of the etiology of her pain   Review of Systems  Constitutional: Positive for fatigue. Negative for activity change and appetite change.  HENT: Negative for ear pain, congestion, neck pain, postnasal drip and sinus pressure.   Eyes: Negative for redness and visual disturbance.  Respiratory: Positive for shortness of breath. Negative for cough and wheezing.   Gastrointestinal: Negative for abdominal pain and abdominal distention.  Genitourinary: Negative for dysuria, frequency and menstrual problem.  Musculoskeletal: Negative for myalgias, joint swelling and arthralgias.  Skin: Negative for rash and wound.  Neurological: Positive for dizziness, tremors and weakness. Negative for headaches.  Hematological: Negative for adenopathy. Does not bruise/bleed easily.  Psychiatric/Behavioral: Negative for disturbed wake/sleep cycle and decreased concentration.   Past Medical History  Diagnosis Date  . Thyroid disease   . Restless leg syndrome   . Osteoporosis   . Constipation   . Anemia   . Hypertension     History   Social History  . Marital Status: Married    Spouse Name: N/A    Number of Children: N/A  . Years of Education: N/A   Occupational History  . Not on file.   Social History Main Topics  . Smoking status:  Current Some Day Smoker -- 0.5 packs/day    Types: Cigarettes  . Smokeless tobacco: Never Used  . Alcohol Use: No  . Drug Use: No  . Sexually Active: No   Other Topics Concern  . Not on file   Social History Narrative  . No narrative on file    Past Surgical History  Procedure Date  . Appendectomy   . Abdominal hysterectomy   . Tonsillectomy   . Eye surgery   . Cataract extraction     Family History  Problem Relation Age of Onset  . Coronary artery disease    . Heart disease Father     Allergies  Allergen Reactions  . Codeine     REACTION: unspecified    Current Outpatient Prescriptions on File Prior to Visit  Medication Sig Dispense Refill  . calcium-vitamin D (OSCAL WITH D) 500-200 MG-UNIT per tablet Take 1 tablet by mouth daily.        . ergocalciferol (VITAMIN D2) 50000 UNITS capsule Take 50,000 Units by mouth 2 (two) times a week.        . esomeprazole (NEXIUM) 40 MG capsule Take 1 capsule (40 mg total) by mouth daily before breakfast.  30 capsule  11  . hydrochlorothiazide (MICROZIDE) 12.5 MG capsule Take 1 capsule (12.5 mg total) by mouth daily.  30 capsule  6  . Mometasone Furo-Formoterol Fum (DULERA) 100-5 MCG/ACT AERO Inhale 2 puffs into the lungs 2 (two) times daily.        . Polyethylene Glycol 3350 (MIRALAX PO)  Take by mouth daily.        . rotigotine (NEUPRO) 2 MG/24HR Place 1 patch onto the skin daily.  30 patch  12  . traMADol (ULTRAM) 50 MG tablet Take 50 mg by mouth every 6 (six) hours as needed.        Marland Kitchen DISCONTD: Olmesartan-Amlodipine-HCTZ (TRIBENZOR) 40-10-12.5 MG TABS Take 1 tablet by mouth daily.  30 tablet  11    BP 140/80  Pulse 72  Temp 98.2 F (36.8 C)  Resp 16  Ht 5\' 6"  (1.676 m)  Wt 160 lb (72.576 kg)  BMI 25.82 kg/m2       Objective:   Physical Exam  Nursing note and vitals reviewed. Constitutional: She is oriented to person, place, and time. She appears well-developed and well-nourished. No distress.  HENT:  Head:  Normocephalic and atraumatic.  Right Ear: External ear normal.  Left Ear: External ear normal.  Nose: Nose normal.  Mouth/Throat: Oropharynx is clear and moist.  Eyes: Conjunctivae normal and EOM are normal. Pupils are equal, round, and reactive to light.  Neck: Normal range of motion. Neck supple. No JVD present. No tracheal deviation present. No thyromegaly present.  Cardiovascular: Normal rate and regular rhythm.   Murmur heard. Pulmonary/Chest: Effort normal and breath sounds normal. She has no wheezes. She exhibits no tenderness.  Abdominal: Soft. Bowel sounds are normal.  Musculoskeletal: Normal range of motion. She exhibits no edema and no tenderness.  Lymphadenopathy:    She has no cervical adenopathy.  Neurological: She is alert and oriented to person, place, and time. She has normal reflexes. No cranial nerve deficit.  Skin: Skin is warm and dry. She is not diaphoretic.  Psychiatric: She has a normal mood and affect. Her behavior is normal.          Assessment & Plan:  We will give patient a Medrol Dosepak for 7 days and monitor her response if she has a significant response to the anti-inflammatory action will consider long-term anti-inflammatory medication in addition to the Ultram.  Her presentation is complicated by mild to moderate chronic obstructive lung disease history of iron deficiency anemia.  Any dark stools or dizziness with resultant discontinuation of any nonsteroidals

## 2012-09-30 DIAGNOSIS — M545 Low back pain, unspecified: Secondary | ICD-10-CM | POA: Diagnosis not present

## 2012-09-30 DIAGNOSIS — M47817 Spondylosis without myelopathy or radiculopathy, lumbosacral region: Secondary | ICD-10-CM | POA: Diagnosis not present

## 2012-09-30 DIAGNOSIS — M5137 Other intervertebral disc degeneration, lumbosacral region: Secondary | ICD-10-CM | POA: Diagnosis not present

## 2012-12-09 DIAGNOSIS — M545 Low back pain, unspecified: Secondary | ICD-10-CM | POA: Diagnosis not present

## 2012-12-09 DIAGNOSIS — M47817 Spondylosis without myelopathy or radiculopathy, lumbosacral region: Secondary | ICD-10-CM | POA: Diagnosis not present

## 2012-12-09 DIAGNOSIS — M5137 Other intervertebral disc degeneration, lumbosacral region: Secondary | ICD-10-CM | POA: Diagnosis not present

## 2012-12-23 ENCOUNTER — Other Ambulatory Visit: Payer: Self-pay | Admitting: Internal Medicine

## 2013-01-26 ENCOUNTER — Encounter: Payer: Self-pay | Admitting: Internal Medicine

## 2013-01-26 ENCOUNTER — Ambulatory Visit (INDEPENDENT_AMBULATORY_CARE_PROVIDER_SITE_OTHER): Payer: Medicare Other | Admitting: Internal Medicine

## 2013-01-26 VITALS — BP 130/70 | HR 72 | Temp 98.2°F | Resp 16 | Ht 66.0 in | Wt 160.0 lb

## 2013-01-26 DIAGNOSIS — F17200 Nicotine dependence, unspecified, uncomplicated: Secondary | ICD-10-CM

## 2013-01-26 DIAGNOSIS — I10 Essential (primary) hypertension: Secondary | ICD-10-CM

## 2013-01-26 DIAGNOSIS — K219 Gastro-esophageal reflux disease without esophagitis: Secondary | ICD-10-CM

## 2013-01-26 MED ORDER — VARENICLINE TARTRATE 1 MG PO TABS: 1.0000 mg | ORAL_TABLET | Freq: Two times a day (BID) | ORAL | Status: DC

## 2013-01-26 NOTE — Progress Notes (Signed)
  Subjective:    Patient ID: Cheryl Atkinson, female    DOB: 10/11/1931, 79 y.o.   MRN: [id number]  HPI  Hypertension osteoarthritis and states that her reflux esophagitis is largely resolved with the use of a PPI.  She has a history of chronic constipation which she states has improved in general she is feeling well.  She has an actinic keratosis on her posterior scalp which she states that her hairdresser hits it during cuts that it is painful  Review of Systems  Constitutional: Negative for activity change, appetite change and fatigue.  HENT: Negative for ear pain, congestion, neck pain, postnasal drip and sinus pressure.   Eyes: Negative for redness and visual disturbance.  Respiratory: Negative for cough, shortness of breath and wheezing.   Gastrointestinal: Negative for abdominal pain and abdominal distention.  Genitourinary: Negative for dysuria, frequency and menstrual problem.  Musculoskeletal: Positive for myalgias, joint swelling and gait problem. Negative for arthralgias.  Skin: Negative for rash and wound.  Neurological: Negative for dizziness, weakness and headaches.  Hematological: Negative for adenopathy. Does not bruise/bleed easily.  Psychiatric/Behavioral: Negative for sleep disturbance and decreased concentration.       Objective:   Physical Exam  Nursing note and vitals reviewed. Constitutional: She is oriented to person, place, and time. She appears well-developed and well-nourished. No distress.  HENT:  Head: Normocephalic and atraumatic.  Right Ear: External ear normal.  Left Ear: External ear normal.  Nose: Nose normal.  Mouth/Throat: Oropharynx is clear and moist.  Eyes: Conjunctivae and EOM are normal. Pupils are equal, round, and reactive to light.  Neck: Normal range of motion. Neck supple. No JVD present. No tracheal deviation present. No thyromegaly present.  Cardiovascular: Normal rate and regular rhythm.   Murmur heard. Pulmonary/Chest: Effort  normal and breath sounds normal. She has no wheezes. She exhibits no tenderness.  Abdominal: Soft. Bowel sounds are normal.  Musculoskeletal: Normal range of motion. She exhibits no edema and no tenderness.  Lymphadenopathy:    She has no cervical adenopathy.  Neurological: She is alert and oriented to person, place, and time. She has normal reflexes. No cranial nerve deficit.  Skin: She is not diaphoretic.  Seborrheic keratosis of the posterior scalp  Psychiatric: She has a normal mood and affect. Her behavior is normal.          Assessment & Plan:  Stable hypertension on current medications.  History of COPD stable history of constipation stable history of GERD stable.  Actinic keratosis on the posterior scalp treatment of lesion with cryotherapy  Informed consent was obtained in the lesion was treated for 60 seconds of liquid nitrogen application the patient tolerated the procedure well as procedural care was discussed with the patient and instructions should the lesion reappears contact our office immediately

## 2013-01-29 ENCOUNTER — Ambulatory Visit: Payer: Medicare Other | Admitting: Internal Medicine

## 2013-02-24 ENCOUNTER — Encounter: Payer: Self-pay | Admitting: Gastroenterology

## 2013-03-10 DIAGNOSIS — M47817 Spondylosis without myelopathy or radiculopathy, lumbosacral region: Secondary | ICD-10-CM | POA: Diagnosis not present

## 2013-03-10 DIAGNOSIS — M545 Low back pain, unspecified: Secondary | ICD-10-CM | POA: Diagnosis not present

## 2013-03-16 ENCOUNTER — Telehealth: Payer: Self-pay | Admitting: *Deleted

## 2013-03-16 DIAGNOSIS — I10 Essential (primary) hypertension: Secondary | ICD-10-CM

## 2013-03-16 MED ORDER — ROTIGOTINE 2 MG/24HR TD PT24: 1.0000 | MEDICATED_PATCH | Freq: Every day | TRANSDERMAL | Status: DC

## 2013-03-16 MED ORDER — OLMESARTAN-AMLODIPINE-HCTZ 40-10-12.5 MG PO TABS: 1.0000 | ORAL_TABLET | Freq: Every day | ORAL | Status: DC

## 2013-03-16 MED ORDER — TRAMADOL HCL (ER BIPHASIC) 300 MG PO CP24: 300.0000 mg | ORAL_CAPSULE | Freq: Three times a day (TID) | ORAL | Status: DC | PRN

## 2013-03-16 NOTE — Telephone Encounter (Signed)
meds refilled 

## 2013-03-29 DIAGNOSIS — M47817 Spondylosis without myelopathy or radiculopathy, lumbosacral region: Secondary | ICD-10-CM | POA: Diagnosis not present

## 2013-03-29 DIAGNOSIS — M545 Low back pain, unspecified: Secondary | ICD-10-CM | POA: Diagnosis not present

## 2013-04-11 ENCOUNTER — Emergency Department (HOSPITAL_COMMUNITY)
Admission: EM | Admit: 2013-04-11 | Discharge: 2013-04-11 | Disposition: A | Discharge status: Home / Self Care | Payer: Medicare Other | Attending: Emergency Medicine | Admitting: Emergency Medicine

## 2013-04-11 ENCOUNTER — Emergency Department (HOSPITAL_COMMUNITY): Payer: Medicare Other

## 2013-04-11 ENCOUNTER — Encounter (HOSPITAL_COMMUNITY): Payer: Self-pay | Admitting: *Deleted

## 2013-04-11 DIAGNOSIS — M810 Age-related osteoporosis without current pathological fracture: Secondary | ICD-10-CM | POA: Diagnosis not present

## 2013-04-11 DIAGNOSIS — J019 Acute sinusitis, unspecified: Secondary | ICD-10-CM | POA: Diagnosis not present

## 2013-04-11 DIAGNOSIS — J3489 Other specified disorders of nose and nasal sinuses: Secondary | ICD-10-CM | POA: Insufficient documentation

## 2013-04-11 DIAGNOSIS — R42 Dizziness and giddiness: Secondary | ICD-10-CM | POA: Diagnosis not present

## 2013-04-11 DIAGNOSIS — I10 Essential (primary) hypertension: Secondary | ICD-10-CM | POA: Diagnosis not present

## 2013-04-11 DIAGNOSIS — Z862 Personal history of diseases of the blood and blood-forming organs and certain disorders involving the immune mechanism: Secondary | ICD-10-CM | POA: Insufficient documentation

## 2013-04-11 DIAGNOSIS — Z8669 Personal history of other diseases of the nervous system and sense organs: Secondary | ICD-10-CM | POA: Diagnosis not present

## 2013-04-11 DIAGNOSIS — F17200 Nicotine dependence, unspecified, uncomplicated: Secondary | ICD-10-CM | POA: Diagnosis not present

## 2013-04-11 DIAGNOSIS — R51 Headache: Secondary | ICD-10-CM | POA: Diagnosis not present

## 2013-04-11 DIAGNOSIS — R404 Transient alteration of awareness: Secondary | ICD-10-CM | POA: Diagnosis not present

## 2013-04-11 DIAGNOSIS — Z8639 Personal history of other endocrine, nutritional and metabolic disease: Secondary | ICD-10-CM | POA: Insufficient documentation

## 2013-04-11 DIAGNOSIS — Z79899 Other long term (current) drug therapy: Secondary | ICD-10-CM | POA: Insufficient documentation

## 2013-04-11 LAB — CBC WITH DIFFERENTIAL/PLATELET
Basophils Absolute: 0.1 10*3/uL (ref 0.0–0.1)
Basophils Relative: 1 % (ref 0–1)
Eosinophils Absolute: 0.2 10*3/uL (ref 0.0–0.7)
Eosinophils Relative: 3 % (ref 0–5)
HCT: 35.9 % — ABNORMAL LOW (ref 36.0–46.0)
Hemoglobin: 11.9 g/dL — ABNORMAL LOW (ref 12.0–15.0)
Lymphocytes Relative: 28 % (ref 12–46)
Lymphs Abs: 2.5 10*3/uL (ref 0.7–4.0)
MCH: 28.4 pg (ref 26.0–34.0)
MCHC: 33.1 g/dL (ref 30.0–36.0)
MCV: 85.7 fL (ref 78.0–100.0)
Monocytes Absolute: 0.6 10*3/uL (ref 0.1–1.0)
Monocytes Relative: 7 % (ref 3–12)
Neutro Abs: 5.5 10*3/uL (ref 1.7–7.7)
Neutrophils Relative %: 62 % (ref 43–77)
Platelets: 318 10*3/uL (ref 150–400)
RBC: 4.19 MIL/uL (ref 3.87–5.11)
RDW: 13.5 % (ref 11.5–15.5)
WBC: 8.9 10*3/uL (ref 4.0–10.5)

## 2013-04-11 LAB — BASIC METABOLIC PANEL
BUN: 20 mg/dL (ref 6–23)
CO2: 27 mEq/L (ref 19–32)
Calcium: 9 mg/dL (ref 8.4–10.5)
Chloride: 99 mEq/L (ref 96–112)
Creatinine, Ser: 0.87 mg/dL (ref 0.50–1.10)
GFR calc Af Amer: 72 mL/min — ABNORMAL LOW (ref >90)
GFR calc non Af Amer: 62 mL/min — ABNORMAL LOW (ref >90)
Glucose, Bld: 129 mg/dL — ABNORMAL HIGH (ref 70–99)
Potassium: 3.4 mEq/L — ABNORMAL LOW (ref 3.5–5.1)
Sodium: 135 mEq/L (ref 135–145)

## 2013-04-11 MED ORDER — MECLIZINE HCL 25 MG PO TABS
12.5000 mg | ORAL_TABLET | Freq: Once | ORAL | Status: DC
  Filled 2013-04-11: qty 1

## 2013-04-11 MED ORDER — METOCLOPRAMIDE HCL 5 MG/ML IJ SOLN
10.0000 mg | Freq: Once | INTRAMUSCULAR | Status: AC
  Administered 2013-04-11: 10 mg via INTRAVENOUS
  Filled 2013-04-11: qty 2

## 2013-04-11 MED ORDER — AZITHROMYCIN 250 MG PO TABS: 250.0000 mg | ORAL_TABLET | Freq: Every day | ORAL | Status: DC

## 2013-04-11 MED ORDER — MECLIZINE HCL 25 MG PO TABS: 12.5000 mg | ORAL_TABLET | Freq: Three times a day (TID) | ORAL | Status: DC | PRN

## 2013-04-11 MED ORDER — DEXAMETHASONE SODIUM PHOSPHATE 10 MG/ML IJ SOLN
10.0000 mg | Freq: Once | INTRAMUSCULAR | Status: AC
  Administered 2013-04-11: 10 mg via INTRAVENOUS
  Filled 2013-04-11: qty 1

## 2013-04-11 MED ORDER — FLUTICASONE PROPIONATE 50 MCG/ACT NA SUSP: 2.0000 | Freq: Every day | NASAL | Status: DC

## 2013-04-11 NOTE — ED Notes (Signed)
The pt arrived by gems from home with   A headache for 2 days .  She took dramamine at home and her dizziness subsided.  Alert on arrival no distress

## 2013-04-11 NOTE — ED Provider Notes (Signed)
History     CSN: [id number]  Arrival date & time 04/11/13  1617   First MD Initiated Contact with Patient 04/11/13 1626      Chief Complaint  Patient presents with  . Headache    (Consider location/radiation/quality/duration/timing/severity/associated sxs/prior treatment) HPI Comments: Patient presents today with a frontal headache.  She reports that the headache has been present for the past 2 days intermittently.  She describes it as a dull irritating pain.  She reports that the pain is associated with some dizziness.  She describes the dizziness as a feeling that the room is spinning.  She denies any vision changes.  She reports that this headache is different than headaches that she has had in the past.    Patient is a 79 y.o. female presenting with headaches. The history is provided by the patient.  Headache Pain location:  Frontal Quality:  Dull Radiates to:  Does not radiate Severity currently:  Unable to specify Onset quality:  Gradual Timing:  Intermittent Progression:  Worsening Similar to prior headaches: no   Relieved by: improvement with Ultram. Associated symptoms: congestion and dizziness   Associated symptoms: no blurred vision, no cough, no pain, no fever, no focal weakness, no hearing loss, no nausea, no neck pain, no neck stiffness, no numbness, no paresthesias, no photophobia, no sore throat, no syncope, no visual change, no vomiting and no weakness     Past Medical History  Diagnosis Date  . Thyroid disease   . Restless leg syndrome   . Osteoporosis   . Constipation   . Anemia   . Hypertension     Past Surgical History  Procedure Laterality Date  . Appendectomy    . Abdominal hysterectomy    . Tonsillectomy    . Eye surgery    . Cataract extraction      Family History  Problem Relation Age of Onset  . Coronary artery disease    . Heart disease Father     History  Substance Use Topics  . Smoking status: Current Some Day Smoker -- 0.50  packs/day    Types: Cigarettes  . Smokeless tobacco: Never Used  . Alcohol Use: No    OB History   Grav Para Term Preterm Abortions TAB SAB Ect Mult Living                  Review of Systems  Constitutional: Negative for fever and chills.  HENT: Positive for congestion. Negative for hearing loss, sore throat, neck pain and neck stiffness.   Eyes: Negative for blurred vision, photophobia and pain.  Respiratory: Negative for cough.   Cardiovascular: Negative for syncope.  Gastrointestinal: Negative for nausea and vomiting.  Skin: Negative for rash.  Neurological: Positive for dizziness and headaches. Negative for focal weakness, numbness and paresthesias.  All other systems reviewed and are negative.    Allergies  Codeine  Home Medications   Current Outpatient Rx  Name  Route  Sig  Dispense  Refill  . calcium-vitamin D (OSCAL WITH D) 500-200 MG-UNIT per tablet   Oral   Take 1 tablet by mouth daily.           . ergocalciferol (VITAMIN D2) 50000 UNITS capsule   Oral   Take 50,000 Units by mouth 2 (two) times a week. Takes on different days of the week         . esomeprazole (NEXIUM) 40 MG capsule   Oral   Take 1 capsule (40 mg total) by  mouth daily before breakfast.   30 capsule   11   . Mometasone Furo-Formoterol Fum (DULERA) 100-5 MCG/ACT AERO   Inhalation   Inhale 2 puffs into the lungs 2 (two) times daily as needed. For wheezing or shortness of breath         . Olmesartan-Amlodipine-HCTZ (TRIBENZOR) 40-10-12.5 MG TABS   Oral   Take 1 tablet by mouth daily.   90 tablet   3   . Polyethylene Glycol 3350 (MIRALAX PO)   Oral   Take 17 g by mouth at bedtime.          . rotigotine (NEUPRO) 2 MG/24HR   Transdermal   Place 1 patch onto the skin at bedtime.         . traMADol (ULTRAM) 50 MG tablet   Oral   Take 50 mg by mouth at bedtime as needed. For pain         . TraMADol HCl 300 MG CP24   Oral   Take 300 mg by mouth every morning.            BP 114/84  Pulse 73  Temp(Src) 97.6 F (36.4 C) (Oral)  Resp 20  SpO2 98%  Physical Exam  Nursing note and vitals reviewed. Constitutional: She appears well-developed and well-nourished.  HENT:  Head: Normocephalic and atraumatic.  Right Ear: Hearing, tympanic membrane and ear canal normal.  Left Ear: Hearing, tympanic membrane and ear canal normal.  Nose: Nose normal. Right sinus exhibits no maxillary sinus tenderness and no frontal sinus tenderness. Left sinus exhibits no maxillary sinus tenderness and no frontal sinus tenderness.  Mouth/Throat: Uvula is midline, oropharynx is clear and moist and mucous membranes are normal.  Eyes: EOM are normal. Pupils are equal, round, and reactive to light.  Neck: Normal range of motion. Neck supple.  Cardiovascular: Normal rate, regular rhythm and normal heart sounds.   Pulmonary/Chest: Effort normal and breath sounds normal.  Neurological: She is alert. She has normal strength. No cranial nerve deficit or sensory deficit.  Skin: Skin is warm and dry.  Psychiatric: She has a normal mood and affect.    ED Course  Procedures (including critical care time)  Labs Reviewed  CBC WITH DIFFERENTIAL - Abnormal; Notable for the following:    Hemoglobin 11.9 (*)    HCT 35.9 (*)    All other components within normal limits  BASIC METABOLIC PANEL   Ct Head Wo Contrast  04/11/2013  *RADIOLOGY REPORT*  Clinical Data: Headache, dizziness.  CT HEAD WITHOUT CONTRAST  Technique:  Contiguous axial images were obtained from the base of the skull through the vertex without contrast.  Comparison: None.  Findings: There is atrophy and chronic small vessel disease changes. No acute intracranial abnormality.  Specifically, no hemorrhage, hydrocephalus, mass lesion, acute infarction, or significant intracranial injury.  No acute calvarial abnormality.  Air-fluid levels are noted in the sphenoid sinuses.  Mastoids are clear.  IMPRESSION: No acute intracranial  abnormality.  Atrophy, chronic microvascular disease.  Air-fluid levels in the sphenoid sinuses suggesting acute sinusitis.   Original Report Authenticated By: Charlett Nose, M.D.      No diagnosis found.  Patient discussed with Dr. Ranae Palms who also evaluated the patient.    MDM  Patient presenting with a frontal headache that has been present intermittently over the past 2 days.  Headache associated with some dizziness, which she describes as a vertigo feeling.  Normal neurological exam aside from feeling dizzy when ambulating.  Head CT  shows an Acute Sinusitis.  Patient treated with nasal spray and antibiotic.  Patient also given Rx for Antivert.  Return precautions given.        Pascal Lux [place], PA-C 04/12/13 1208

## 2013-04-11 NOTE — ED Notes (Signed)
Swallow screen failed. PA notified.

## 2013-04-13 DIAGNOSIS — M47817 Spondylosis without myelopathy or radiculopathy, lumbosacral region: Secondary | ICD-10-CM | POA: Diagnosis not present

## 2013-04-13 DIAGNOSIS — M545 Low back pain, unspecified: Secondary | ICD-10-CM | POA: Diagnosis not present

## 2013-04-13 NOTE — ED Provider Notes (Signed)
Medical screening examination/treatment/procedure(s) were conducted as a shared visit with non-physician practitioner(s) and myself.  I personally evaluated the patient during the encounter  No concerning feature of HA. CT and history consistent with sinusitis.   Loren Racer, MD 04/13/13 [phone]

## 2013-05-05 DIAGNOSIS — M5380 Other specified dorsopathies, site unspecified: Secondary | ICD-10-CM | POA: Diagnosis not present

## 2013-05-05 DIAGNOSIS — M545 Low back pain, unspecified: Secondary | ICD-10-CM | POA: Diagnosis not present

## 2013-05-25 DIAGNOSIS — H35369 Drusen (degenerative) of macula, unspecified eye: Secondary | ICD-10-CM | POA: Diagnosis not present

## 2013-05-25 DIAGNOSIS — H26499 Other secondary cataract, unspecified eye: Secondary | ICD-10-CM | POA: Diagnosis not present

## 2013-05-26 ENCOUNTER — Ambulatory Visit (INDEPENDENT_AMBULATORY_CARE_PROVIDER_SITE_OTHER): Payer: Medicare Other | Admitting: Internal Medicine

## 2013-05-26 ENCOUNTER — Other Ambulatory Visit: Payer: Self-pay | Admitting: *Deleted

## 2013-05-26 ENCOUNTER — Encounter: Payer: Self-pay | Admitting: Internal Medicine

## 2013-05-26 VITALS — BP 130/74 | HR 72 | Temp 98.0°F | Resp 16 | Ht 66.0 in | Wt 160.0 lb

## 2013-05-26 DIAGNOSIS — M545 Low back pain, unspecified: Secondary | ICD-10-CM | POA: Diagnosis not present

## 2013-05-26 DIAGNOSIS — I10 Essential (primary) hypertension: Secondary | ICD-10-CM

## 2013-05-26 DIAGNOSIS — M25559 Pain in unspecified hip: Secondary | ICD-10-CM | POA: Diagnosis not present

## 2013-05-26 DIAGNOSIS — M810 Age-related osteoporosis without current pathological fracture: Secondary | ICD-10-CM

## 2013-05-26 DIAGNOSIS — M48061 Spinal stenosis, lumbar region without neurogenic claudication: Secondary | ICD-10-CM

## 2013-05-26 DIAGNOSIS — D649 Anemia, unspecified: Secondary | ICD-10-CM

## 2013-05-26 LAB — BASIC METABOLIC PANEL
BUN: 19 mg/dL (ref 6–23)
CO2: 26 mEq/L (ref 19–32)
Calcium: 9.5 mg/dL (ref 8.4–10.5)
Chloride: 101 mEq/L (ref 96–112)
Creatinine, Ser: 0.7 mg/dL (ref 0.4–1.2)
GFR: 87.06 mL/min (ref >60.00)
Glucose, Bld: 87 mg/dL (ref 70–99)
Potassium: 4.3 mEq/L (ref 3.5–5.1)
Sodium: 135 mEq/L (ref 135–145)

## 2013-05-26 LAB — CBC WITH DIFFERENTIAL/PLATELET
Basophils Absolute: 0 10*3/uL (ref 0.0–0.1)
Basophils Relative: 0.5 % (ref 0.0–3.0)
Eosinophils Absolute: 0.3 10*3/uL (ref 0.0–0.7)
Eosinophils Relative: 3.3 % (ref 0.0–5.0)
HCT: 35.9 % — ABNORMAL LOW (ref 36.0–46.0)
Hemoglobin: 11.5 g/dL — ABNORMAL LOW (ref 12.0–15.0)
Lymphocytes Relative: 29.8 % (ref 12.0–46.0)
Lymphs Abs: 2.8 10*3/uL (ref 0.7–4.0)
MCHC: 32 g/dL (ref 30.0–36.0)
MCV: 89.7 fl (ref 78.0–100.0)
Monocytes Absolute: 0.8 10*3/uL (ref 0.1–1.0)
Monocytes Relative: 8.1 % (ref 3.0–12.0)
Neutro Abs: 5.5 10*3/uL (ref 1.4–7.7)
Neutrophils Relative %: 58.3 % (ref 43.0–77.0)
Platelets: 347 10*3/uL (ref 150.0–400.0)
RBC: 4 Mil/uL (ref 3.87–5.11)
RDW: 14.2 % (ref 11.5–14.6)
WBC: 9.4 10*3/uL (ref 4.5–10.5)

## 2013-05-26 LAB — CALCIUM: Calcium: 9.5 mg/dL (ref 8.4–10.5)

## 2013-05-26 NOTE — Progress Notes (Signed)
  Subjective:    Patient ID: Cheryl Atkinson, female    DOB: 27-Jun-1931, 79 y.o.   MRN: [id number]  HPI    Review of Systems     Objective:   Physical Exam        Assessment & Plan:  Refer for reclast

## 2013-05-26 NOTE — Patient Instructions (Signed)
The patient is instructed to continue all medications as prescribed. Schedule followup with check out clerk upon leaving the clinic  

## 2013-05-26 NOTE — Progress Notes (Signed)
  Subjective:    Patient ID: Cheryl Atkinson, female    DOB: July 29, 1931, 79 y.o.   MRN: [id number]  HPIpatient with chronic radicular back pain has had multiple lumbar injections the relief pain for a few days but did not have any long-standing affect and she continues to require pain medication as her primary pain management.  She has a history of iron deficiency anemia mild COPD chronic constipation related to pain medications.      Review of Systems  Constitutional: Negative for activity change, appetite change and fatigue.  HENT: Negative.  Negative for ear pain, congestion, neck pain, postnasal drip and sinus pressure.   Eyes: Negative for redness and visual disturbance.  Respiratory: Negative.  Negative for cough, shortness of breath and wheezing.   Cardiovascular: Negative.   Gastrointestinal: Positive for abdominal pain, constipation and abdominal distention.  Genitourinary: Negative for dysuria, frequency and menstrual problem.  Musculoskeletal: Negative for myalgias, joint swelling and arthralgias.  Skin: Negative for rash and wound.  Neurological: Positive for dizziness and light-headedness. Negative for weakness and headaches.       "wobbly" gait  Hematological: Negative.  Negative for adenopathy. Does not bruise/bleed easily.  Psychiatric/Behavioral: Negative.  Negative for sleep disturbance and decreased concentration.       Objective:   Physical Exam  Nursing note and vitals reviewed. Constitutional: She is oriented to person, place, and time. She appears well-developed and well-nourished. No distress.  HENT:  Head: Normocephalic and atraumatic.  Eyes: Conjunctivae and EOM are normal. Pupils are equal, round, and reactive to light.  Neck: Normal range of motion. Neck supple. No JVD present. No tracheal deviation present. No thyromegaly present.  Cardiovascular: Normal rate, regular rhythm and intact distal pulses.   Murmur heard. Pulmonary/Chest: Effort normal and  breath sounds normal. She has no wheezes. She exhibits no tenderness.  Abdominal: Soft. Bowel sounds are normal.  Musculoskeletal: Normal range of motion. She exhibits no edema and no tenderness.  Lumbar lordosis  Lymphadenopathy:    She has no cervical adenopathy.  Neurological: She is alert and oriented to person, place, and time. She has normal reflexes. No cranial nerve deficit.  Skin: Skin is warm and dry. She is not diaphoretic.  Psychiatric: She has a normal mood and affect. Her behavior is normal.          Assessment & Plan:  Since chronic lumbar radiculopathy and some gait unstable and is due to neuropathy pain and numbness Reason for back exercises Use of pain medication Correction of constipation Monitoring for anemia

## 2013-05-27 LAB — VITAMIN D 25 HYDROXY (VIT D DEFICIENCY, FRACTURES): Vit D, 25-Hydroxy: 31 ng/mL (ref 30–89)

## 2013-06-03 DIAGNOSIS — M47817 Spondylosis without myelopathy or radiculopathy, lumbosacral region: Secondary | ICD-10-CM | POA: Diagnosis not present

## 2013-06-03 DIAGNOSIS — M545 Low back pain, unspecified: Secondary | ICD-10-CM | POA: Diagnosis not present

## 2013-06-08 ENCOUNTER — Ambulatory Visit (HOSPITAL_COMMUNITY): Admission: RE | Admit: 2013-06-08 | Payer: Medicare Other | Source: Ambulatory Visit

## 2013-06-08 ENCOUNTER — Other Ambulatory Visit (HOSPITAL_COMMUNITY): Payer: Self-pay | Admitting: *Deleted

## 2013-08-25 ENCOUNTER — Ambulatory Visit: Payer: Medicare Other | Admitting: Internal Medicine

## 2013-08-26 DIAGNOSIS — M47817 Spondylosis without myelopathy or radiculopathy, lumbosacral region: Secondary | ICD-10-CM | POA: Diagnosis not present

## 2013-08-26 DIAGNOSIS — M545 Low back pain, unspecified: Secondary | ICD-10-CM | POA: Diagnosis not present

## 2013-08-30 ENCOUNTER — Encounter: Payer: Self-pay | Admitting: Internal Medicine

## 2013-08-30 ENCOUNTER — Ambulatory Visit (INDEPENDENT_AMBULATORY_CARE_PROVIDER_SITE_OTHER): Payer: Medicare Other | Admitting: Internal Medicine

## 2013-08-30 VITALS — BP 138/80 | HR 72 | Temp 98.0°F | Resp 16 | Ht 66.0 in | Wt 162.0 lb

## 2013-08-30 DIAGNOSIS — Z23 Encounter for immunization: Secondary | ICD-10-CM

## 2013-08-30 DIAGNOSIS — E040 Nontoxic diffuse goiter: Secondary | ICD-10-CM

## 2013-08-30 DIAGNOSIS — E042 Nontoxic multinodular goiter: Secondary | ICD-10-CM | POA: Insufficient documentation

## 2013-08-30 DIAGNOSIS — G2581 Restless legs syndrome: Secondary | ICD-10-CM

## 2013-08-30 DIAGNOSIS — I10 Essential (primary) hypertension: Secondary | ICD-10-CM

## 2013-08-30 LAB — T4, FREE: Free T4: 0.93 ng/dL (ref 0.60–1.60)

## 2013-08-30 LAB — TSH: TSH: 0.37 u[IU]/mL (ref 0.35–5.50)

## 2013-08-30 LAB — T3, FREE: T3, Free: 2.7 pg/mL (ref 2.3–4.2)

## 2013-08-30 MED ORDER — AMOXICILLIN-POT CLAVULANATE 875-125 MG PO TABS: 1.0000 | ORAL_TABLET | Freq: Two times a day (BID) | ORAL | Status: DC

## 2013-08-30 MED ORDER — OLMESARTAN-AMLODIPINE-HCTZ 40-10-12.5 MG PO TABS: 1.0000 | ORAL_TABLET | Freq: Every day | ORAL | Status: DC

## 2013-08-30 MED ORDER — ROTIGOTINE 2 MG/24HR TD PT24: 1.0000 | MEDICATED_PATCH | Freq: Every day | TRANSDERMAL | Status: DC

## 2013-08-30 NOTE — Progress Notes (Signed)
Subjective:    Patient ID: Cheryl Atkinson, female    DOB: August 29, 1931, 79 y.o.   MRN: [id number]  HPI Patient is an 79 year old female with a family history of goiter who noticed a sudden enlargement in her lower right neck associated with some minor swallowing difficulty but no pain fever sore throat or infectious symptomology.  She has no prior history of thyroid disease   Review of Systems  Constitutional: Negative for activity change, appetite change and fatigue.  HENT: Negative for ear pain, congestion, neck pain, postnasal drip and sinus pressure.   Eyes: Negative for redness and visual disturbance.  Respiratory: Negative for cough, shortness of breath and wheezing.   Gastrointestinal: Negative for abdominal pain and abdominal distention.  Genitourinary: Negative for dysuria, frequency and menstrual problem.  Musculoskeletal: Negative for myalgias, joint swelling and arthralgias.  Skin: Negative for rash and wound.  Neurological: Negative for dizziness, weakness and headaches.  Hematological: Negative for adenopathy. Does not bruise/bleed easily.  Psychiatric/Behavioral: Negative for sleep disturbance and decreased concentration.   Past Medical History  Diagnosis Date  . Thyroid disease   . Restless leg syndrome   . Osteoporosis   . Constipation   . Anemia   . Hypertension     History   Social History  . Marital Status: Widowed    Spouse Name: N/A    Number of Children: N/A  . Years of Education: N/A   Occupational History  . Not on file.   Social History Main Topics  . Smoking status: Current Some Day Smoker -- 0.50 packs/day    Types: Cigarettes  . Smokeless tobacco: Never Used  . Alcohol Use: No  . Drug Use: No  . Sexual Activity: No   Other Topics Concern  . Not on file   Social History Narrative  . No narrative on file    Past Surgical History  Procedure Laterality Date  . Appendectomy    . Abdominal hysterectomy    . Tonsillectomy    . Eye  surgery    . Cataract extraction      Family History  Problem Relation Age of Onset  . Coronary artery disease    . Heart disease Father     Allergies  Allergen Reactions  . Codeine Other (See Comments)    REACTION: Syncope     Current Outpatient Prescriptions on File Prior to Visit  Medication Sig Dispense Refill  . calcium-vitamin D (OSCAL WITH D) 500-200 MG-UNIT per tablet Take 1 tablet by mouth daily.        . ergocalciferol (VITAMIN D2) 50000 UNITS capsule Take 50,000 Units by mouth 2 (two) times a week. Takes on different days of the week      . esomeprazole (NEXIUM) 40 MG capsule Take 1 capsule (40 mg total) by mouth daily before breakfast.  30 capsule  11  . meclizine (ANTIVERT) 25 MG tablet Take 0.5 tablets (12.5 mg total) by mouth 3 (three) times daily as needed.  5 tablet  0  . Olmesartan-Amlodipine-HCTZ (TRIBENZOR) 40-10-12.5 MG TABS Take 1 tablet by mouth daily.  90 tablet  3  . Polyethylene Glycol 3350 (MIRALAX PO) Take 17 g by mouth at bedtime.       . rotigotine (NEUPRO) 2 MG/24HR Place 1 patch onto the skin at bedtime.      . traMADol (ULTRAM) 50 MG tablet Take 50 mg by mouth at bedtime as needed. For pain      . TraMADol HCl 300 MG CP24  Take 300 mg by mouth every morning.       No current facility-administered medications on file prior to visit.    BP 138/80  Pulse 72  Temp(Src) 98 F (36.7 C)  Resp 16  Ht 5\' 6"  (1.676 m)  Wt 162 lb (73.483 kg)  BMI 26.16 kg/m2        Objective:   Physical Exam  Constitutional: She is oriented to person, place, and time. She appears well-developed and well-nourished. No distress.  HENT:  Head: Normocephalic and atraumatic.  Nose: Nose normal.  Eyes: Conjunctivae and EOM are normal. Pupils are equal, round, and reactive to light.  Neck: Normal range of motion. No JVD present. No tracheal deviation present. Thyromegaly present.  Right thyroid mass  Cardiovascular: Normal rate, regular rhythm and intact distal  pulses.   Murmur heard. Pulmonary/Chest: Effort normal and breath sounds normal. She has no wheezes. She exhibits no tenderness.  Abdominal: Soft. Bowel sounds are normal.  Musculoskeletal: Normal range of motion. She exhibits no edema and no tenderness.  Lymphadenopathy:    She has no cervical adenopathy.  Neurological: She is alert and oriented to person, place, and time. She has normal reflexes. No cranial nerve deficit.  Skin: Rash noted. She is not diaphoretic. There is erythema.  infected dog bite on her right lower leg   Psychiatric: She has a normal mood and affect. Her behavior is normal.          Assessment & Plan:  Sudden onset goiter most probably a colloid cyst due to with rapid development etiology Ultrasound scheduled appropriate blood work will be drawn and referral to neurology for consideration for biopsy or surgery based upon the results of the ultrasound.  Stable blood pressure Moderate dysphagia due to the size of the goiter able to swallow Refill medications  Augmentin 875 twice a day for 10 days for infected.bite

## 2013-08-31 LAB — THYROGLOBULIN ANTIBODY: Thyroglobulin Ab: <20 U/mL (ref <40.0)

## 2013-09-01 ENCOUNTER — Ambulatory Visit
Admission: RE | Admit: 2013-09-01 | Discharge: 2013-09-01 | Disposition: A | Discharge status: Home / Self Care | Payer: Medicare Other | Source: Ambulatory Visit | Attending: Internal Medicine | Admitting: Internal Medicine

## 2013-09-01 DIAGNOSIS — E040 Nontoxic diffuse goiter: Secondary | ICD-10-CM

## 2013-09-01 DIAGNOSIS — E042 Nontoxic multinodular goiter: Secondary | ICD-10-CM | POA: Diagnosis not present

## 2013-09-10 ENCOUNTER — Ambulatory Visit (INDEPENDENT_AMBULATORY_CARE_PROVIDER_SITE_OTHER): Payer: Medicare Other | Admitting: Internal Medicine

## 2013-09-10 ENCOUNTER — Encounter: Payer: Self-pay | Admitting: Internal Medicine

## 2013-09-10 VITALS — BP 108/64 | HR 77 | Temp 98.3°F | Resp 10 | Ht 65.25 in | Wt 161.0 lb

## 2013-09-10 DIAGNOSIS — E040 Nontoxic diffuse goiter: Secondary | ICD-10-CM

## 2013-09-10 DIAGNOSIS — E041 Nontoxic single thyroid nodule: Secondary | ICD-10-CM | POA: Insufficient documentation

## 2013-09-10 NOTE — Patient Instructions (Signed)
You will be called by [facility] about the appointment for the cyst drainage. Please continue to follow with Dr Lovell Sheehan afterwards but please let me know if it the cyst increases in size again.

## 2013-09-10 NOTE — Progress Notes (Addendum)
Patient ID: Cheryl Atkinson, female   DOB: 1931-02-23, 79 y.o.   MRN: [id number]   HPI  Cheryl Atkinson is a 79 y.o.-year-old female, referred by her PCP, Dr. Lovell Sheehan, for evaluation for an enlarging right thyroid cyst.  She started to feel the R neck mass 1 mo ago >> appt with PCP on 08/30/2013. She was sent for a thyroid U/S.   Thyroid U/S performed on 09/01/2013 revealed a multinodular goiter with bilateral nodules, largest of 1.1 cm. However, she has a very large, 4.9 x 3.8 x 3.7 cm cyst with larger loculations, raising the possibility of layering hemorrhage.  Pt also has dysphagia even with saliva, no odynophagia, + hoarseness, + SOB with lying down. She did not have a URI or fever recently.   I reviewed pt's thyroid tests: Lab Results  Component Value Date   TSH 0.37 08/30/2013   TSH 1.10 09/20/2009   TSH 2.07 12/18/2006   FREET4 0.93 08/30/2013    Pt does not have signs of hypo-or hyperthyroidism.   Pt has a FH of thyroid ds: hyperthyroidism and had RAI tx. Mother had a goiter. No FH of thyroid cancer. No h/o radiation tx to head or neck.  No seaweed or kelp, no recent contrast studies. No steroid use. No herbal supplements.   I reviewed her chart and she also has a history of RLS, OP, GERD, IDA, HTN, COPD, spinal stenosis.   ROS: Constitutional: no weight gain/loss, no fatigue, no subjective hyperthermia/hypothermia, + poor sleep, + nocturia and Eyes: no blurry vision, no xerophthalmia ENT: no sore throat, + nodule palpated in throat, + dysphagia/no odynophagia, + occasional hoarseness Cardiovascular: no CP/SOB/palpitations/leg swelling Respiratory: no cough/SOB Gastrointestinal: no N/V/D/+ C Musculoskeletal: no muscle/joint aches Skin: no rashes Neurological: no tremors/numbness/tingling/dizziness Psychiatric: no depression/anxiety  Past Medical History  Diagnosis Date  . Thyroid disease   . Restless leg syndrome   . Osteoporosis   . Constipation   . Anemia   .  Hypertension    Past Surgical History  Procedure Laterality Date  . Appendectomy    . Abdominal hysterectomy    . Tonsillectomy    . Eye surgery    . Cataract extraction     History   Social History  . Marital Status: Widowed    Spouse Name: N/A    Number of Children: 2, 1 adopted   Occupational History  . retired   Social History Main Topics  . Smoking status: Current Some Day Smoker -- 0.50 packs/day    Types: Cigarettes  . Smokeless tobacco: Never Used  . Alcohol Use: No  . Drug Use: No  . Sexual Activity: No   Social History Narrative   Regular exercise: none   Caffeine use: cup of coffee    Current Outpatient Prescriptions on File Prior to Visit  Medication Sig Dispense Refill  . amoxicillin-clavulanate (AUGMENTIN) 875-125 MG per tablet Take 1 tablet by mouth 2 (two) times daily.  20 tablet  0  . calcium-vitamin D (OSCAL WITH D) 500-200 MG-UNIT per tablet Take 1 tablet by mouth daily.        . ergocalciferol (VITAMIN D2) 50000 UNITS capsule Take 50,000 Units by mouth 2 (two) times a week. Takes on different days of the week      . esomeprazole (NEXIUM) 40 MG capsule Take 1 capsule (40 mg total) by mouth daily before breakfast.  30 capsule  11  . meclizine (ANTIVERT) 25 MG tablet Take 0.5 tablets (12.5 mg total) by  mouth 3 (three) times daily as needed.  5 tablet  0  . Olmesartan-Amlodipine-HCTZ (TRIBENZOR) 40-10-12.5 MG TABS Take 1 tablet by mouth daily.  90 tablet  3  . Polyethylene Glycol 3350 (MIRALAX PO) Take 17 g by mouth at bedtime.       . rotigotine (NEUPRO) 2 MG/24HR Place 1 patch onto the skin at bedtime.  90 patch  3  . traMADol (ULTRAM) 50 MG tablet Take 50 mg by mouth at bedtime as needed. For pain      . TraMADol HCl 300 MG CP24 Take 300 mg by mouth every morning.       No current facility-administered medications on file prior to visit.   Allergies  Allergen Reactions  . Codeine Other (See Comments)    REACTION: Syncope    Family History   Problem Relation Age of Onset  . Coronary artery disease    . Heart disease Father    PE: BP 108/64  Pulse 77  Temp(Src) 98.3 F (36.8 C) (Oral)  Resp 10  Ht 5' 5.25" (1.657 m)  Wt 161 lb (73.029 kg)  BMI 26.6 kg/m2  SpO2 97% Wt Readings from Last 3 Encounters:  09/10/13 161 lb (73.029 kg)  08/30/13 162 lb (73.483 kg)  05/26/13 160 lb (72.576 kg)   Constitutional: overweight, in NAD Eyes: PERRLA, EOMI, no exophthalmos ENT: moist mucous membranes, + very large thyroid mass palpated in the right lower neck, no cervical lymphadenopathy Cardiovascular: RRR, No MRG Respiratory: CTA B Gastrointestinal: abdomen soft, NT, ND, BS+ Musculoskeletal: no deformities, strength intact in all 4;  Skin: moist, warm, no rashes Neurological: no tremor with outstretched hands, DTR normal in all 4  ASSESSMENT: 1. Enlarging thyroid cyst - thyroid U/S (09/01/2013):   Right thyroid lobe - 7.1 x 3.6 x 3.7 cm. Large complex cystic and solid nodule measuring 4.9 x 3.8 x 3.7 cm. Heterogeneity, with echogenic material in 1 of the larger loculations (arrow on image 25) raising the possibility of layering hemorrhage. Additional subcentimeter right lobe nodules up to 5 mm.   Left thyroid lobe - 5.4 x 1.3 x 1.1 cm. Small nodules, the largest is cystic and solid with vascularity in the central gland measuring up to 1.1 cm (image 60).   Isthmus Thickness: 3 mm. Multiple small nodules, the largest is mixed in echogenicity but appears solid measuring up to 1.1 cm laterally on the right (abutting the large complex right lobe nodule).  Lymphadenopathy - None visualized.  PLAN: 1. Enlarging right thyroid cyst - I reviewed the images of her thyroid ultrasound along with the patient. I pointed out that she has a large thyroid cyst with questionable hemorrhage inside of it on a background of multinodular goiter. Most likely that the hemorrhage caused sudden enlargement of an already existing cyst. - Since she is  symptomatic from the rapidly enlarging cyst, I suggested that she has it drained in interventional radiology - she agrees to have this done - we did discuss about the fact that if the cyst refills and we need to repeatedly training, she might need to have hemi- thyroidectomy, but I believe that this is not the likely outcome - we will not schedule the patient for a return visit but I advised her to let me know if the nodule enlarges again  09/16/2013 11 mL of fluid extracted >> cytologic report: Adequacy Reason Satisfactory For Evaluation. Diagnosis THYROID, FINE NEEDLE ASPIRATION RIGHT LOBE, (SPECIMEN 1 OF 1, COLLECTED ON 09/15/2013). BENIGN. FINDINGS CONSISTENT WITH  NON-NEOPLASTIC GOITER AND WITH THE CONTENTS OF A CYST. [place] RUND DO Pathologist, Electronic Signature (Case signed 09/16/2013)report

## 2013-09-15 ENCOUNTER — Other Ambulatory Visit (HOSPITAL_COMMUNITY)
Admission: RE | Admit: 2013-09-15 | Discharge: 2013-09-15 | Disposition: A | Discharge status: Home / Self Care | Payer: Medicare Other | Source: Ambulatory Visit | Attending: Interventional Radiology | Admitting: Interventional Radiology

## 2013-09-15 ENCOUNTER — Ambulatory Visit
Admission: RE | Admit: 2013-09-15 | Discharge: 2013-09-15 | Disposition: A | Discharge status: Home / Self Care | Payer: Medicare Other | Source: Ambulatory Visit | Attending: Internal Medicine | Admitting: Internal Medicine

## 2013-09-15 DIAGNOSIS — E041 Nontoxic single thyroid nodule: Secondary | ICD-10-CM | POA: Diagnosis not present

## 2013-09-15 DIAGNOSIS — E049 Nontoxic goiter, unspecified: Secondary | ICD-10-CM | POA: Diagnosis not present

## 2013-09-23 ENCOUNTER — Encounter (HOSPITAL_COMMUNITY): Payer: Self-pay | Admitting: Emergency Medicine

## 2013-09-23 ENCOUNTER — Emergency Department (INDEPENDENT_AMBULATORY_CARE_PROVIDER_SITE_OTHER)
Admission: EM | Admit: 2013-09-23 | Discharge: 2013-09-23 | Disposition: A | Discharge status: Home / Self Care | Payer: Medicare Other | Source: Home / Self Care | Attending: Family Medicine | Admitting: Family Medicine

## 2013-09-23 DIAGNOSIS — M75100 Unspecified rotator cuff tear or rupture of unspecified shoulder, not specified as traumatic: Secondary | ICD-10-CM | POA: Diagnosis not present

## 2013-09-23 DIAGNOSIS — M7551 Bursitis of right shoulder: Secondary | ICD-10-CM

## 2013-09-23 MED ORDER — METHYLPREDNISOLONE ACETATE 40 MG/ML IJ SUSP
40.0000 mg | Freq: Once | INTRAMUSCULAR | Status: AC
  Administered 2013-09-23: 40 mg via INTRA_ARTICULAR

## 2013-09-23 MED ORDER — METHYLPREDNISOLONE ACETATE 40 MG/ML IJ SUSP
INTRAMUSCULAR | Status: AC
  Filled 2013-09-23: qty 5

## 2013-09-23 NOTE — ED Notes (Signed)
Pt c/o right shoulder pain onset 3 months Last 2 weeks have been worse Denies: inj/trauma, strenuous activity Hx of arthritis... Unable to raise arm above head... Pain increases w/activity Alert w/no signs of acute distress... Ambulated well to exam room.

## 2013-09-23 NOTE — ED Provider Notes (Signed)
Cheryl Atkinson is a 79 y.o. female who presents to Urgent Care today for right shoulder pain present for about 3 months. Pain worsened over the past 2 weeks. Patient denies any injury. She has moderate to severe anterior to lateral proximal shoulder pain. The pain is worse with overhead motion and at night. She denies any neck pain radiating pain weakness or numbness. She takes tramadol for pain which works only a bit. No fevers or chills radiating pain weakness or numbness.    Past Medical History  Diagnosis Date  . Thyroid disease   . Restless leg syndrome   . Osteoporosis   . Constipation   . Anemia   . Hypertension    History  Substance Use Topics  . Smoking status: Current Some Day Smoker -- 0.50 packs/day    Types: Cigarettes  . Smokeless tobacco: Never Used  . Alcohol Use: No   ROS as above Medications reviewed. Current Facility-Administered Medications  Medication Dose Route Frequency Provider Last Rate Last Dose  . methylPREDNISolone acetate (DEPO-MEDROL) injection 40 mg  40 mg Intra-articular Once Rodolph Bong, MD       Current Outpatient Prescriptions  Medication Sig Dispense Refill  . Olmesartan-Amlodipine-HCTZ (TRIBENZOR) 40-10-12.5 MG TABS Take 1 tablet by mouth daily.  90 tablet  3  . traMADol (ULTRAM) 50 MG tablet Take 50 mg by mouth at bedtime as needed. For pain      . amoxicillin-clavulanate (AUGMENTIN) 875-125 MG per tablet Take 1 tablet by mouth 2 (two) times daily.  20 tablet  0  . calcium-vitamin D (OSCAL WITH D) 500-200 MG-UNIT per tablet Take 1 tablet by mouth daily.        . ergocalciferol (VITAMIN D2) 50000 UNITS capsule Take 50,000 Units by mouth 2 (two) times a week. Takes on different days of the week      . esomeprazole (NEXIUM) 40 MG capsule Take 1 capsule (40 mg total) by mouth daily before breakfast.  30 capsule  11  . meclizine (ANTIVERT) 25 MG tablet Take 0.5 tablets (12.5 mg total) by mouth 3 (three) times daily as needed.  5 tablet  0  .  Polyethylene Glycol 3350 (MIRALAX PO) Take 17 g by mouth at bedtime.       . rotigotine (NEUPRO) 2 MG/24HR Place 1 patch onto the skin at bedtime.  90 patch  3  . TraMADol HCl 300 MG CP24 Take 300 mg by mouth every morning.        Exam:  BP 136/89  Pulse 72  Temp(Src) 98.2 F (36.8 C) (Oral)  Resp 18  SpO2 99% Gen: Well NAD HEENT: EOMI,  MMM Lungs: CTABL Nl WOB Heart: RRR no MRG Abd: NABS, NT, ND Exts: Non edematous BL  LE, warm and well perfused.  Right shoulder: Normal-appearing nontender. Abduction limited to about 100 active and 160 passive. Negative drop arm sign External rotation to 90 internal rotation full Positive impingement testing Strength is intact 4+ to 5 out of 5 for abduction external and internal rotation strength Sensation and grip strength and capillary refill is intact distally bilateral upper extremity   Subacromial Injection: rt shoulder  Consent obtained and time out performed.  Area cleaned with alcohol.  40Mg  of depomedrol and 4ml of 0.5% marcaine was injected into the subacromial bursa without complication or bleeding. Patient tolerated the procedure well.      Assessment and Plan: 79 y.o. female with right shoulder subacromial bursitis and impingement. Treated with corticosteroid injection as well  as tramadol. Plan to initiate home exercise program. If not improved followup with orthopedics. Discussed warning signs or symptoms. Please see discharge instructions. Patient expresses understanding.      Rodolph Bong, MD 09/23/13 [phone]

## 2013-11-02 ENCOUNTER — Emergency Department (INDEPENDENT_AMBULATORY_CARE_PROVIDER_SITE_OTHER)
Admission: EM | Admit: 2013-11-02 | Discharge: 2013-11-02 | Disposition: A | Discharge status: Home / Self Care | Payer: Medicare Other | Source: Home / Self Care | Attending: Family Medicine | Admitting: Family Medicine

## 2013-11-02 ENCOUNTER — Encounter (HOSPITAL_COMMUNITY): Payer: Self-pay | Admitting: Emergency Medicine

## 2013-11-02 DIAGNOSIS — R21 Rash and other nonspecific skin eruption: Secondary | ICD-10-CM

## 2013-11-02 DIAGNOSIS — L259 Unspecified contact dermatitis, unspecified cause: Secondary | ICD-10-CM

## 2013-11-02 DIAGNOSIS — L309 Dermatitis, unspecified: Secondary | ICD-10-CM

## 2013-11-02 MED ORDER — METRONIDAZOLE 1 % EX GEL: Freq: Every day | CUTANEOUS | Status: DC

## 2013-11-02 MED ORDER — TRIAMCINOLONE ACETONIDE 0.025 % EX OINT: 1.0000 "application " | TOPICAL_OINTMENT | Freq: Two times a day (BID) | CUTANEOUS | Status: DC

## 2013-11-02 MED ORDER — MUPIROCIN 2 % EX OINT: TOPICAL_OINTMENT | CUTANEOUS | Status: DC

## 2013-11-02 NOTE — ED Provider Notes (Signed)
CSN: [id number]     Arrival date & time 11/02/13  0935 History   First MD Initiated Contact with Patient 11/02/13 1015     Chief Complaint  Patient presents with  . Rash   HPI 79 yo female who presents with a rash. Noticed it 2 weeks ago on her fingers. She then noticed it on her arms and face. The rash is itchy. She has tried calomine lotion which helped with the itching. She denies any recent change in detergents, soaps, lotions. She has not been outside doing any yard work. She has a dog that is let out outside. She denies any new meds. No fevers, no chills. Doing well otherwise.   Past Medical History  Diagnosis Date  . Thyroid disease   . Restless leg syndrome   . Osteoporosis   . Constipation   . Anemia   . Hypertension    Past Surgical History  Procedure Laterality Date  . Appendectomy    . Abdominal hysterectomy    . Tonsillectomy    . Eye surgery    . Cataract extraction     Family History  Problem Relation Age of Onset  . Coronary artery disease    . Heart disease Father    History  Substance Use Topics  . Smoking status: Current Some Day Smoker -- 0.50 packs/day    Types: Cigarettes  . Smokeless tobacco: Never Used  . Alcohol Use: No   OB History   Grav Para Term Preterm Abortions TAB SAB Ect Mult Living                 Review of Systems Negative except per HPI Allergies  Codeine  Home Medications   Current Outpatient Rx  Name  Route  Sig  Dispense  Refill  . calcium-vitamin D (OSCAL WITH D) 500-200 MG-UNIT per tablet   Oral   Take 1 tablet by mouth daily.           . ergocalciferol (VITAMIN D2) 50000 UNITS capsule   Oral   Take 50,000 Units by mouth 2 (two) times a week. Takes on different days of the week         . esomeprazole (NEXIUM) 40 MG capsule   Oral   Take 1 capsule (40 mg total) by mouth daily before breakfast.   30 capsule   11   . Olmesartan-Amlodipine-HCTZ (TRIBENZOR) 40-10-12.5 MG TABS   Oral   Take 1 tablet by mouth  daily.   90 tablet   3   . amoxicillin-clavulanate (AUGMENTIN) 875-125 MG per tablet   Oral   Take 1 tablet by mouth 2 (two) times daily.   20 tablet   0   . meclizine (ANTIVERT) 25 MG tablet   Oral   Take 0.5 tablets (12.5 mg total) by mouth 3 (three) times daily as needed.   5 tablet   0   . metroNIDAZOLE (METROGEL) 1 % gel   Topical   Apply topically daily. Apply thin film to face for 2 weeks   45 g   0   . mupirocin ointment (BACTROBAN) 2 %      Apply 1 application to crusty area on face twice daily for 1 week   22 g   1   . Polyethylene Glycol 3350 (MIRALAX PO)   Oral   Take 17 g by mouth at bedtime.          . rotigotine (NEUPRO) 2 MG/24HR   Transdermal   Place  1 patch onto the skin at bedtime.   90 patch   3   . traMADol (ULTRAM) 50 MG tablet   Oral   Take 50 mg by mouth at bedtime as needed. For pain         . TraMADol HCl 300 MG CP24   Oral   Take 300 mg by mouth every morning.         . triamcinolone (KENALOG) 0.025 % ointment   Topical   Apply 1 application topically 2 (two) times daily.   80 g   0    BP 127/74  Pulse 77  Temp(Src) 97.1 F (36.2 C) (Oral)  Resp 20  SpO2 96% Physical Exam General: alert and oriented x4, no acute distress Skin: right hand: white dry patch in betwee n 4th and 5th and 1st and 2nd fingers Excoriated pink patch on right forearm  Face: pink discoloration on cheeks, small excoriated patch on right cheek and temple ED Course  Procedures (including critical care time)  MDM   1. Eczema   2. Rash of face    79 yo female, immunocompetent, well appearing who presents with rash: 1. Rash on hands and arms: consistent with dishydrotic eczema. Treat with triamcinolone ointment. No evidence of superimposed infection.  2. Rash on face: rosacea vs seborrheic dermatitis: will treat for rosacea with metrogel. Treat small excoriated areas around cheek with mupirocin to prevent infection. If not better, patient to  return to care.   Marena Chancy, PGY-3 Family Medicine Resident    Lonia Skinner, MD 11/02/13 [phone]

## 2013-11-02 NOTE — ED Notes (Signed)
C/o rash. On set two weeks ago.  States started between fingers, which is now crusted over.  Rash is now on face and arms.  Some mild swelling with the rash on face.  Pt has tried calimine lotion with no relief.  Denies any changes in soaps and detergents.

## 2013-11-03 NOTE — ED Provider Notes (Signed)
Medical screening examination/treatment/procedure(s) were performed by a resident physician or non-physician practitioner and as the supervising physician I was immediately available for consultation/collaboration.  Darbie Biancardi, MD    Donta Fuster S Cynthia Cogle, MD 11/03/13 0931 

## 2013-11-19 ENCOUNTER — Emergency Department (INDEPENDENT_AMBULATORY_CARE_PROVIDER_SITE_OTHER)
Admission: EM | Admit: 2013-11-19 | Discharge: 2013-11-19 | Disposition: A | Discharge status: Home / Self Care | Payer: Medicare Other | Source: Home / Self Care

## 2013-11-19 ENCOUNTER — Encounter (HOSPITAL_COMMUNITY): Payer: Self-pay | Admitting: Emergency Medicine

## 2013-11-19 DIAGNOSIS — S91051D Open bite, right ankle, subsequent encounter: Secondary | ICD-10-CM

## 2013-11-19 DIAGNOSIS — W540XXA Bitten by dog, initial encounter: Secondary | ICD-10-CM | POA: Diagnosis not present

## 2013-11-19 DIAGNOSIS — L0390 Cellulitis, unspecified: Secondary | ICD-10-CM

## 2013-11-19 DIAGNOSIS — L0291 Cutaneous abscess, unspecified: Secondary | ICD-10-CM

## 2013-11-19 MED ORDER — CLINDAMYCIN HCL 300 MG PO CAPS: 300.0000 mg | ORAL_CAPSULE | Freq: Four times a day (QID) | ORAL | Status: DC

## 2013-11-19 NOTE — ED Notes (Signed)
Pt c/o rt ankle wound received about two months ago from a dog bite. States there is clear drainage and swelling. Denies any radiation. Has tried home remedies with no relief. States there is very little pain except for in the mornings, which it feels like a throbbing pain. Written by: Marga Melnick

## 2013-11-19 NOTE — ED Provider Notes (Signed)
CSN: [id number]     Arrival date & time 11/19/13  1053 History   First MD Initiated Contact with Patient 11/19/13 1248     Chief Complaint  Patient presents with  . Animal Bite   (Consider location/radiation/quality/duration/timing/severity/associated sxs/prior Treatment) HPI Comments: As above. Saw her PCP after the bite and tx with an ABX. The wound is located R medial /distal lower leg just above the ankle. More recently getting more red and swelling. Has been applying and keeping neosporin oint on the wound daily. Has chronic ankle edema due to old injury. Pt's dog and UTD on immunizations  Patient is a 79 y.o. female presenting with animal bite.  Animal Bite Associated symptoms: no fever and no rash     Past Medical History  Diagnosis Date  . Thyroid disease   . Restless leg syndrome   . Osteoporosis   . Constipation   . Anemia   . Hypertension    Past Surgical History  Procedure Laterality Date  . Appendectomy    . Abdominal hysterectomy    . Tonsillectomy    . Eye surgery    . Cataract extraction     Family History  Problem Relation Age of Onset  . Coronary artery disease    . Heart disease Father    History  Substance Use Topics  . Smoking status: Current Some Day Smoker -- 0.50 packs/day    Types: Cigarettes  . Smokeless tobacco: Never Used  . Alcohol Use: No   OB History   Grav Para Term Preterm Abortions TAB SAB Ect Mult Living                 Review of Systems  Constitutional: Negative for fever and activity change.  HENT: Negative.   Respiratory: Negative.   Skin: Positive for color change and wound. Negative for rash.    Allergies  Codeine  Home Medications   Current Outpatient Rx  Name  Route  Sig  Dispense  Refill  . rotigotine (NEUPRO) 2 MG/24HR   Transdermal   Place 1 patch onto the skin at bedtime.   90 patch   3   . traMADol (ULTRAM) 50 MG tablet   Oral   Take 50 mg by mouth at bedtime as needed. For pain         .  amoxicillin-clavulanate (AUGMENTIN) 875-125 MG per tablet   Oral   Take 1 tablet by mouth 2 (two) times daily.   20 tablet   0   . calcium-vitamin D (OSCAL WITH D) 500-200 MG-UNIT per tablet   Oral   Take 1 tablet by mouth daily.           . ergocalciferol (VITAMIN D2) 50000 UNITS capsule   Oral   Take 50,000 Units by mouth 2 (two) times a week. Takes on different days of the week         . esomeprazole (NEXIUM) 40 MG capsule   Oral   Take 1 capsule (40 mg total) by mouth daily before breakfast.   30 capsule   11   . meclizine (ANTIVERT) 25 MG tablet   Oral   Take 0.5 tablets (12.5 mg total) by mouth 3 (three) times daily as needed.   5 tablet   0   . metroNIDAZOLE (METROGEL) 1 % gel   Topical   Apply topically daily. Apply thin film to face for 2 weeks   45 g   0   . mupirocin ointment (BACTROBAN) 2 %  Apply 1 application to crusty area on face twice daily for 1 week   22 g   1   . Olmesartan-Amlodipine-HCTZ (TRIBENZOR) 40-10-12.5 MG TABS   Oral   Take 1 tablet by mouth daily.   90 tablet   3   . Polyethylene Glycol 3350 (MIRALAX PO)   Oral   Take 17 g by mouth at bedtime.          . TraMADol HCl 300 MG CP24   Oral   Take 300 mg by mouth every morning.         . triamcinolone (KENALOG) 0.025 % ointment   Topical   Apply 1 application topically 2 (two) times daily.   80 g   0    BP 135/71  Pulse 67  Temp(Src) 98.3 F (36.8 C) (Oral)  Resp 18  SpO2 95% Physical Exam  Nursing note and vitals reviewed. Constitutional: She is oriented to person, place, and time. She appears well-developed and well-nourished. No distress.  Cardiovascular: Normal rate.   Pulmonary/Chest: Effort normal. No respiratory distress.  Neurological: She is alert and oriented to person, place, and time. She exhibits normal muscle tone.  Skin: Skin is warm and dry.  R lower leg with a central wound appearing to heal by secondary intention. There is surrounding  erythema in a rectangular shape 8 x 4 cm. There is milder erythema and swelling circumscribing the wound/leg. Not tense. Wound is tender but remainder of leg is not. Draining what appears to be serous fluid. No purulence seen.   Psychiatric: She has a normal mood and affect.    ED Course  Procedures (including critical care time) Labs Review Labs Reviewed - No data to display Imaging Review No results found.  MDM   1. Dog bite of ankle, right, subsequent encounter   2. Cellulitis     Clean with NS and apply dry sterile dressing. Wound care as directed No more ointment Keep clean and dry Clindamycin 300mg  See your dr next week, rechk sooner if worse.  Hayden Rasmussen, NP 11/19/13 1318  Hayden Rasmussen, NP 11/19/13 1318

## 2013-11-19 NOTE — ED Provider Notes (Signed)
Medical screening examination/treatment/procedure(s) were performed by resident physician or non-physician practitioner and as supervising physician I was immediately available for consultation/collaboration.   Monroe Toure DOUGLAS MD.   [place] Novosad D Luria Rosario, MD 11/19/13 1708 

## 2013-11-27 ENCOUNTER — Emergency Department (INDEPENDENT_AMBULATORY_CARE_PROVIDER_SITE_OTHER)
Admission: EM | Admit: 2013-11-27 | Discharge: 2013-11-27 | Disposition: A | Discharge status: Other Acute Inpatient Hospital | Payer: Medicare Other | Source: Home / Self Care | Attending: Family Medicine | Admitting: Family Medicine

## 2013-11-27 ENCOUNTER — Encounter (HOSPITAL_COMMUNITY): Payer: Self-pay | Admitting: Emergency Medicine

## 2013-11-27 ENCOUNTER — Emergency Department (HOSPITAL_COMMUNITY): Payer: Medicare Other

## 2013-11-27 ENCOUNTER — Inpatient Hospital Stay (HOSPITAL_COMMUNITY)
Admission: EM | Admit: 2013-11-27 | Discharge: 2013-11-29 | DRG: 603 | Disposition: A | Discharge status: Home / Self Care | Payer: Medicare Other | Attending: Internal Medicine | Admitting: Internal Medicine

## 2013-11-27 DIAGNOSIS — K219 Gastro-esophageal reflux disease without esophagitis: Secondary | ICD-10-CM

## 2013-11-27 DIAGNOSIS — I10 Essential (primary) hypertension: Secondary | ICD-10-CM | POA: Diagnosis not present

## 2013-11-27 DIAGNOSIS — W540XXA Bitten by dog, initial encounter: Secondary | ICD-10-CM | POA: Diagnosis not present

## 2013-11-27 DIAGNOSIS — M545 Low back pain, unspecified: Secondary | ICD-10-CM

## 2013-11-27 DIAGNOSIS — M25473 Effusion, unspecified ankle: Secondary | ICD-10-CM | POA: Diagnosis not present

## 2013-11-27 DIAGNOSIS — Z9849 Cataract extraction status, unspecified eye: Secondary | ICD-10-CM

## 2013-11-27 DIAGNOSIS — K5909 Other constipation: Secondary | ICD-10-CM

## 2013-11-27 DIAGNOSIS — L02419 Cutaneous abscess of limb, unspecified: Secondary | ICD-10-CM | POA: Diagnosis not present

## 2013-11-27 DIAGNOSIS — Z9089 Acquired absence of other organs: Secondary | ICD-10-CM | POA: Diagnosis not present

## 2013-11-27 DIAGNOSIS — M48061 Spinal stenosis, lumbar region without neurogenic claudication: Secondary | ICD-10-CM

## 2013-11-27 DIAGNOSIS — F17200 Nicotine dependence, unspecified, uncomplicated: Secondary | ICD-10-CM | POA: Diagnosis present

## 2013-11-27 DIAGNOSIS — M810 Age-related osteoporosis without current pathological fracture: Secondary | ICD-10-CM | POA: Diagnosis present

## 2013-11-27 DIAGNOSIS — G2581 Restless legs syndrome: Secondary | ICD-10-CM | POA: Diagnosis present

## 2013-11-27 DIAGNOSIS — E042 Nontoxic multinodular goiter: Secondary | ICD-10-CM

## 2013-11-27 DIAGNOSIS — J449 Chronic obstructive pulmonary disease, unspecified: Secondary | ICD-10-CM | POA: Diagnosis present

## 2013-11-27 DIAGNOSIS — D509 Iron deficiency anemia, unspecified: Secondary | ICD-10-CM

## 2013-11-27 DIAGNOSIS — S81009A Unspecified open wound, unspecified knee, initial encounter: Secondary | ICD-10-CM | POA: Diagnosis not present

## 2013-11-27 DIAGNOSIS — Z8249 Family history of ischemic heart disease and other diseases of the circulatory system: Secondary | ICD-10-CM | POA: Diagnosis not present

## 2013-11-27 DIAGNOSIS — Z23 Encounter for immunization: Secondary | ICD-10-CM | POA: Diagnosis not present

## 2013-11-27 DIAGNOSIS — L03115 Cellulitis of right lower limb: Secondary | ICD-10-CM

## 2013-11-27 DIAGNOSIS — Z79899 Other long term (current) drug therapy: Secondary | ICD-10-CM | POA: Diagnosis not present

## 2013-11-27 DIAGNOSIS — Z87448 Personal history of other diseases of urinary system: Secondary | ICD-10-CM

## 2013-11-27 DIAGNOSIS — E041 Nontoxic single thyroid nodule: Secondary | ICD-10-CM

## 2013-11-27 DIAGNOSIS — J4489 Other specified chronic obstructive pulmonary disease: Secondary | ICD-10-CM | POA: Diagnosis present

## 2013-11-27 DIAGNOSIS — M7989 Other specified soft tissue disorders: Secondary | ICD-10-CM | POA: Diagnosis not present

## 2013-11-27 HISTORY — DX: Cellulitis of right lower limb: L03.115

## 2013-11-27 LAB — CBC WITH DIFFERENTIAL/PLATELET
Basophils Absolute: 0.1 10*3/uL (ref 0.0–0.1)
Basophils Relative: 1 % (ref 0–1)
Eosinophils Absolute: 0.5 10*3/uL (ref 0.0–0.7)
Eosinophils Relative: 5 % (ref 0–5)
HCT: 36.3 % (ref 36.0–46.0)
Hemoglobin: 12.1 g/dL (ref 12.0–15.0)
Lymphocytes Relative: 29 % (ref 12–46)
Lymphs Abs: 2.7 10*3/uL (ref 0.7–4.0)
MCH: 29.4 pg (ref 26.0–34.0)
MCHC: 33.3 g/dL (ref 30.0–36.0)
MCV: 88.1 fL (ref 78.0–100.0)
Monocytes Absolute: 0.7 10*3/uL (ref 0.1–1.0)
Monocytes Relative: 8 % (ref 3–12)
Neutro Abs: 5.4 10*3/uL (ref 1.7–7.7)
Neutrophils Relative %: 58 % (ref 43–77)
Platelets: 360 10*3/uL (ref 150–400)
RBC: 4.12 MIL/uL (ref 3.87–5.11)
RDW: 13.9 % (ref 11.5–15.5)
WBC: 9.3 10*3/uL (ref 4.0–10.5)

## 2013-11-27 LAB — BASIC METABOLIC PANEL
BUN: 13 mg/dL (ref 6–23)
CO2: 26 mEq/L (ref 19–32)
Calcium: 8.8 mg/dL (ref 8.4–10.5)
Chloride: 99 mEq/L (ref 96–112)
Creatinine, Ser: 0.6 mg/dL (ref 0.50–1.10)
GFR calc Af Amer: >90 mL/min (ref >90)
GFR calc non Af Amer: 84 mL/min — ABNORMAL LOW (ref >90)
Glucose, Bld: 102 mg/dL — ABNORMAL HIGH (ref 70–99)
Potassium: 3.7 mEq/L (ref 3.5–5.1)
Sodium: 134 mEq/L — ABNORMAL LOW (ref 135–145)

## 2013-11-27 MED ORDER — HYDROCODONE-ACETAMINOPHEN 5-325 MG PO TABS
1.0000 | ORAL_TABLET | ORAL | Status: DC | PRN
  Administered 2013-11-27 – 2013-11-28 (×2): 1 via ORAL
  Filled 2013-11-27 (×2): qty 1

## 2013-11-27 MED ORDER — ENOXAPARIN SODIUM 40 MG/0.4ML ~~LOC~~ SOLN
40.0000 mg | SUBCUTANEOUS | Status: DC
  Administered 2013-11-27 – 2013-11-28 (×2): 40 mg via SUBCUTANEOUS
  Filled 2013-11-27 (×3): qty 0.4

## 2013-11-27 MED ORDER — POLYETHYLENE GLYCOL 3350 17 G PO PACK
17.0000 g | PACK | Freq: Every day | ORAL | Status: DC
  Administered 2013-11-27: 17 g via ORAL
  Filled 2013-11-27 (×3): qty 1

## 2013-11-27 MED ORDER — ACETAMINOPHEN 325 MG PO TABS: 650.0000 mg | ORAL_TABLET | Freq: Four times a day (QID) | ORAL | Status: DC | PRN

## 2013-11-27 MED ORDER — DOCUSATE SODIUM 100 MG PO CAPS
100.0000 mg | ORAL_CAPSULE | Freq: Two times a day (BID) | ORAL | Status: DC
  Administered 2013-11-27: 100 mg via ORAL
  Filled 2013-11-27 (×4): qty 1

## 2013-11-27 MED ORDER — VANCOMYCIN HCL 10 G IV SOLR
1250.0000 mg | INTRAVENOUS | Status: DC
  Administered 2013-11-28 – 2013-11-29 (×2): 1250 mg via INTRAVENOUS
  Filled 2013-11-27 (×2): qty 1250

## 2013-11-27 MED ORDER — MORPHINE SULFATE 2 MG/ML IJ SOLN
1.0000 mg | INTRAMUSCULAR | Status: DC | PRN
  Administered 2013-11-27: 1 mg via INTRAVENOUS
  Filled 2013-11-27: qty 1

## 2013-11-27 MED ORDER — PANTOPRAZOLE SODIUM 40 MG PO TBEC
40.0000 mg | DELAYED_RELEASE_TABLET | Freq: Every day | ORAL | Status: DC
  Administered 2013-11-28 – 2013-11-29 (×2): 40 mg via ORAL
  Filled 2013-11-27: qty 1

## 2013-11-27 MED ORDER — PIPERACILLIN-TAZOBACTAM 3.375 G IVPB 30 MIN
3.3750 g | INTRAVENOUS | Status: AC
  Administered 2013-11-27: 3.375 g via INTRAVENOUS
  Filled 2013-11-27: qty 50

## 2013-11-27 MED ORDER — PIPERACILLIN-TAZOBACTAM 3.375 G IVPB
3.3750 g | Freq: Three times a day (TID) | INTRAVENOUS | Status: DC
  Administered 2013-11-27 – 2013-11-29 (×5): 3.375 g via INTRAVENOUS
  Filled 2013-11-27 (×10): qty 50

## 2013-11-27 MED ORDER — ONDANSETRON HCL 4 MG PO TABS: 4.0000 mg | ORAL_TABLET | Freq: Four times a day (QID) | ORAL | Status: DC | PRN

## 2013-11-27 MED ORDER — SODIUM CHLORIDE 0.9 % IV SOLN
INTRAVENOUS | Status: DC
  Administered 2013-11-27: 13:00:00 via INTRAVENOUS

## 2013-11-27 MED ORDER — ACETAMINOPHEN 650 MG RE SUPP: 650.0000 mg | Freq: Four times a day (QID) | RECTAL | Status: DC | PRN

## 2013-11-27 MED ORDER — TETANUS-DIPHTH-ACELL PERTUSSIS 5-2.5-18.5 LF-MCG/0.5 IM SUSP
INTRAMUSCULAR | Status: AC
  Filled 2013-11-27: qty 0.5

## 2013-11-27 MED ORDER — CALCIUM CARBONATE-VITAMIN D 500-200 MG-UNIT PO TABS
1.0000 | ORAL_TABLET | Freq: Every day | ORAL | Status: DC
  Administered 2013-11-28 – 2013-11-29 (×2): 1 via ORAL
  Filled 2013-11-27 (×2): qty 1

## 2013-11-27 MED ORDER — VANCOMYCIN HCL IN DEXTROSE 1-5 GM/200ML-% IV SOLN
1000.0000 mg | Freq: Once | INTRAVENOUS | Status: AC
  Administered 2013-11-27: 1000 mg via INTRAVENOUS
  Filled 2013-11-27: qty 200

## 2013-11-27 MED ORDER — SODIUM CHLORIDE 0.9 % IV SOLN
INTRAVENOUS | Status: DC
  Administered 2013-11-27 (×2): 100 mL/h via INTRAVENOUS

## 2013-11-27 MED ORDER — ROTIGOTINE 2 MG/24HR TD PT24
1.0000 | MEDICATED_PATCH | Freq: Every day | TRANSDERMAL | Status: DC
  Administered 2013-11-28: 1 via TRANSDERMAL

## 2013-11-27 MED ORDER — TETANUS-DIPHTH-ACELL PERTUSSIS 5-2.5-18.5 LF-MCG/0.5 IM SUSP
0.5000 mL | Freq: Once | INTRAMUSCULAR | Status: AC
  Administered 2013-11-27: 0.5 mL via INTRAMUSCULAR

## 2013-11-27 MED ORDER — ONDANSETRON HCL 4 MG/2ML IJ SOLN: 4.0000 mg | Freq: Four times a day (QID) | INTRAMUSCULAR | Status: DC | PRN

## 2013-11-27 NOTE — ED Provider Notes (Signed)
CSN: [id number]     Arrival date & time 11/27/13  4098 History   First MD Initiated Contact with Patient 11/27/13 1021     Chief Complaint  Patient presents with  . Follow-up   (Consider location/radiation/quality/duration/timing/severity/associated sxs/prior Treatment) Patient is a 79 y.o. female presenting with leg pain. The history is provided by the patient.  Leg Pain Location:  Leg Time since incident:  2 months Injury: yes   Mechanism of injury comment:  Dog bite injury, seen by lmd with abx and on 12/19 with clindamycin, sx worsening. Leg location:  R lower leg Pain details:    Quality:  Throbbing   Severity:  Moderate   Progression:  Worsening Chronicity:  Chronic Tetanus status:  Out of date Prior injury to area:  No   Past Medical History  Diagnosis Date  . Thyroid disease   . Restless leg syndrome   . Osteoporosis   . Constipation   . Anemia   . Hypertension    Past Surgical History  Procedure Laterality Date  . Appendectomy    . Abdominal hysterectomy    . Tonsillectomy    . Eye surgery    . Cataract extraction     Family History  Problem Relation Age of Onset  . Coronary artery disease    . Heart disease Father    History  Substance Use Topics  . Smoking status: Current Some Day Smoker -- 0.50 packs/day    Types: Cigarettes  . Smokeless tobacco: Never Used  . Alcohol Use: No   OB History   Grav Para Term Preterm Abortions TAB SAB Ect Mult Living                 Review of Systems  Constitutional: Negative.   Skin: Positive for rash and wound.    Allergies  Codeine  Home Medications   Current Outpatient Rx  Name  Route  Sig  Dispense  Refill  . amoxicillin-clavulanate (AUGMENTIN) 875-125 MG per tablet   Oral   Take 1 tablet by mouth 2 (two) times daily.   20 tablet   0   . calcium-vitamin D (OSCAL WITH D) 500-200 MG-UNIT per tablet   Oral   Take 1 tablet by mouth daily.           . clindamycin (CLEOCIN) 300 MG capsule  Oral   Take 1 capsule (300 mg total) by mouth 4 (four) times daily. X 10 days   40 capsule   0   . ergocalciferol (VITAMIN D2) 50000 UNITS capsule   Oral   Take 50,000 Units by mouth 2 (two) times a week. Takes on different days of the week         . esomeprazole (NEXIUM) 40 MG capsule   Oral   Take 1 capsule (40 mg total) by mouth daily before breakfast.   30 capsule   11   . meclizine (ANTIVERT) 25 MG tablet   Oral   Take 0.5 tablets (12.5 mg total) by mouth 3 (three) times daily as needed.   5 tablet   0   . metroNIDAZOLE (METROGEL) 1 % gel   Topical   Apply topically daily. Apply thin film to face for 2 weeks   45 g   0   . mupirocin ointment (BACTROBAN) 2 %      Apply 1 application to crusty area on face twice daily for 1 week   22 g   1   . Olmesartan-Amlodipine-HCTZ (TRIBENZOR) 40-10-12.5  MG TABS   Oral   Take 1 tablet by mouth daily.   90 tablet   3   . Polyethylene Glycol 3350 (MIRALAX PO)   Oral   Take 17 g by mouth at bedtime.          . rotigotine (NEUPRO) 2 MG/24HR   Transdermal   Place 1 patch onto the skin at bedtime.   90 patch   3   . traMADol (ULTRAM) 50 MG tablet   Oral   Take 50 mg by mouth at bedtime as needed. For pain         . TraMADol HCl 300 MG CP24   Oral   Take 300 mg by mouth every morning.         . triamcinolone (KENALOG) 0.025 % ointment   Topical   Apply 1 application topically 2 (two) times daily.   80 g   0    BP 148/78  Pulse 68  Temp(Src) 98.8 F (37.1 C) (Oral)  Resp 18  SpO2 97% Physical Exam  Nursing note and vitals reviewed. Constitutional: She is oriented to person, place, and time. She appears well-developed and well-nourished.  Musculoskeletal: She exhibits edema and tenderness.  Neurological: She is alert and oriented to person, place, and time.  Skin: Skin is warm and dry. There is erythema.  draining cellulitis area to right lower leg.    ED Course  Procedures (including critical  care time) Labs Review Labs Reviewed - No data to display Imaging Review No results found.  EKG Interpretation    Date/Time:    Ventricular Rate:    PR Interval:    QRS Duration:   QT Interval:    QTC Calculation:   R Axis:     Text Interpretation:              MDM  Sent for possible u/s eval , iv abx or I+D for nonhealing dog bite wound to right lower leg. Poss sterile abscess.    Linna Hoff, MD 11/27/13 1106

## 2013-11-27 NOTE — ED Provider Notes (Signed)
Medical screening examination/treatment/procedure(s) were conducted as a shared visit with non-physician practitioner(s) and myself.  I personally evaluated the patient during the encounter.  Dog bite to R ankle 2 months ago. Worsening redness and swelling despite outpatient antibiotics x2. No fever. Circumferential cellulitis to distal third of R leg with swelling over medial malleolus.  FROM R ankle.  +2 DP pulses. R/o abscess, admit for IV antibiotics as failed outpatient tx.  EKG Interpretation    Date/Time:    Ventricular Rate:    PR Interval:    QRS Duration:   QT Interval:    QTC Calculation:   R Axis:     Text Interpretation:               Glynn Octave, MD 11/27/13 4540

## 2013-11-27 NOTE — ED Notes (Signed)
Pt moved to Pod C, room 24.  Report given to [facility].

## 2013-11-27 NOTE — ED Notes (Signed)
Patient transported to X-ray 

## 2013-11-27 NOTE — ED Notes (Addendum)
Pt sent here from [facility] with RLE infection

## 2013-11-27 NOTE — ED Provider Notes (Signed)
CSN: [id number]     Arrival date & time 11/27/13  1129 History   First MD Initiated Contact with Patient 11/27/13 1226     Chief Complaint  Patient presents with  . Wound Infection   (Consider location/radiation/quality/duration/timing/severity/associated sxs/prior Treatment) HPI  PCP: Carrie Mew, MD  PMH: Past Medical History  Diagnosis Date  . Thyroid disease   . Restless leg syndrome   . Osteoporosis   . Constipation   . Anemia   . Hypertension     Patient presents to the ED with complaints of worsening infection to right ankle. The patient was bit by a dog a couple weeks ago. It was the family's dog is up-to-date on all of its vaccinations. Patient was seen by her primary care doctor and started on antibiotic. She then went to the urgent care on December 19 and was switched to clindamycin because it was starting to local affected. Today the patient went to the urgent care and was seen by physician there. The physician noted that the infection continued to progress and questioned abscess formation.       Past Medical History  Diagnosis Date  . Thyroid disease   . Restless leg syndrome   . Osteoporosis   . Constipation   . Anemia   . Hypertension    Past Surgical History  Procedure Laterality Date  . Appendectomy    . Abdominal hysterectomy    . Tonsillectomy    . Eye surgery    . Cataract extraction     Family History  Problem Relation Age of Onset  . Coronary artery disease    . Heart disease Father    History  Substance Use Topics  . Smoking status: Current Some Day Smoker -- 0.50 packs/day    Types: Cigarettes  . Smokeless tobacco: Never Used  . Alcohol Use: No   OB History   Grav Para Term Preterm Abortions TAB SAB Ect Mult Living                 Review of Systems The patient denies anorexia, fever, weight loss,, vision loss, decreased hearing, hoarseness, chest pain, syncope, dyspnea on exertion, peripheral edema, balance deficits,  hemoptysis, abdominal pain, melena, hematochezia, severe indigestion/heartburn, hematuria, incontinence, genital sores, muscle weakness, suspicious skin lesions, transient blindness, difficulty walking, depression, unusual weight change, abnormal bleeding, enlarged lymph nodes, angioedema, and breast masses.  Allergies  Codeine  Home Medications   Current Outpatient Rx  Name  Route  Sig  Dispense  Refill  . calcium-vitamin D (OSCAL WITH D) 500-200 MG-UNIT per tablet   Oral   Take 1 tablet by mouth daily.           . clindamycin (CLEOCIN) 300 MG capsule   Oral   Take 1 capsule (300 mg total) by mouth 4 (four) times daily. X 10 days   40 capsule   0   . ergocalciferol (VITAMIN D2) 50000 UNITS capsule   Oral   Take 50,000 Units by mouth 2 (two) times a week. Takes on different days of the week         . esomeprazole (NEXIUM) 40 MG capsule   Oral   Take 1 capsule (40 mg total) by mouth daily before breakfast.   30 capsule   11   . meclizine (ANTIVERT) 25 MG tablet   Oral   Take 12.5 mg by mouth 3 (three) times daily as needed for dizziness.         . Olmesartan-Amlodipine-HCTZ (  TRIBENZOR) 40-10-12.5 MG TABS   Oral   Take 1 tablet by mouth daily.   90 tablet   3   . Polyethylene Glycol 3350 (MIRALAX PO)   Oral   Take 17 g by mouth at bedtime.          . rotigotine (NEUPRO) 2 MG/24HR   Transdermal   Place 1 patch onto the skin at bedtime.   90 patch   3   . traMADol (ULTRAM) 50 MG tablet   Oral   Take 50 mg by mouth at bedtime as needed. For pain         . TraMADol HCl 300 MG CP24   Oral   Take 300 mg by mouth every morning.         . triamcinolone (KENALOG) 0.025 % ointment   Topical   Apply 1 application topically 2 (two) times daily.   80 g   0    BP 139/61  Pulse 67  Temp(Src) 98.2 F (36.8 C) (Oral)  Resp 20  SpO2 100% Physical Exam  Nursing note and vitals reviewed. Constitutional: She appears well-developed and well-nourished. No  distress.  HENT:  Head: Normocephalic and atraumatic.  Eyes: Pupils are equal, round, and reactive to light.  Neck: Normal range of motion. Neck supple.  Cardiovascular: Normal rate and regular rhythm.   Pulmonary/Chest: Effort normal.  Abdominal: Soft.  Musculoskeletal:  circumferential cellulitis and tenderness to lower right leg with open healing wound that is draining a small amount of fluid. She also has some cellulitis streaking up her right leg towards her knee. It is indurated, tender, skin is warm/moist, pedal pulses are symmetrical.  Neurological: She is alert.  Skin: Skin is warm and dry.    ED Course  Procedures (including critical care time) Labs Review Labs Reviewed  BASIC METABOLIC PANEL - Abnormal; Notable for the following:    Sodium 134 (*)    Glucose, Bld 102 (*)    GFR calc non Af Amer 84 (*)    All other components within normal limits  CBC WITH DIFFERENTIAL   Imaging Review Dg Ankle Complete Right  11/27/2013   CLINICAL DATA:  Right ankle wound/infection medially  EXAM: RIGHT ANKLE - COMPLETE 3+ VIEW  COMPARISON:  None  FINDINGS: Osseous demineralization.  Narrowing of ankle mortise.  Diffuse soft tissue swelling.  No acute fracture, dislocation or bone destruction.  Specifically no cortical loss is seen at the medial aspect of the ankle to suggest osteomyelitis.  Anterior process fluid calcaneus is poorly visualized on the lateral view.  IMPRESSION: Osseous demineralization.  No definite acute bony findings.  If there is persistent clinical concern for osteomyelitis recommend MR imaging.   Electronically Signed   By: Ulyses Southward M.D.   On: 11/27/2013 13:57   [place] Extrem Low Right Comp  11/27/2013   CLINICAL DATA:  Status post dog bite 2 months ago with swelling about the right ankle. Question abscess.  EXAM: RIGHT LOWER EXTREMITY SOFT TISSUE ULTRASOUND COMPLETE  TECHNIQUE: Ultrasound examination was performed including evaluation of the muscles, tendons, joint,  and adjacent soft tissues.  COMPARISON:  None.  FINDINGS: There is no focal fluid collection.  Subcutaneous edema is noted.  IMPRESSION: Subcutaneous edema could be due to cellulitis or dependent change. Negative for abscess.   Electronically Signed   By: Drusilla Kanner M.D.   On: 11/27/2013 14:02    EKG Interpretation   None       MDM   1.  Cellulitis of right lower leg    Images show no signs of osteo or abscess that needs to be drained. IV vancomycin started do to PO outpatient failure.  [facility]ited [facility], [facility], inpatient, Team 10, med-surg    Dorthula Matas, [place] 11/27/13 1438

## 2013-11-27 NOTE — H&P (Signed)
[facility] History and Physical  Cheryl Atkinson ZOX:[id number] DOB: 1931/11/12 DOA: 11/27/2013  Referring physician: Ellin Saba, PA.  PCP: Carrie Mew, MD   Chief Complaint: worsening lower extremity infection.   HPI: Cheryl Atkinson is a 79 y.o. female with PMH significant for anemia, Hypertension, thyroid diseases, who was refer for admission by urgent care for Lower extremity cellulitis. Patient was bit by a dog on October. She saw her PCP at that time. She was prescribe antibiotics for 10 days. She didn't notice significant improvement. Her follow up with PCP is mid February. She went to urgent car on 12-19 and was prescribe clindamycin. She notice worsening redness on her right LE. The redness is spreading up to her knee.  Patient denies chest pain, dyspnea, abdominal pain, nausea, vomiting, diarrhea.    Review of Systems:  Negative except as per HPI.   Past Medical History  Diagnosis Date  . Thyroid disease   . Restless leg syndrome   . Osteoporosis   . Constipation   . Anemia   . Hypertension    Past Surgical History  Procedure Laterality Date  . Appendectomy    . Abdominal hysterectomy    . Tonsillectomy    . Eye surgery    . Cataract extraction     Social History:  reports that she has been smoking Cigarettes.  She has been smoking about 0.50 packs per day. She has never used smokeless tobacco. She reports that she does not drink alcohol or use illicit drugs.  Allergies  Allergen Reactions  . Codeine Other (See Comments)    REACTION: Syncope     Family History  Problem Relation Age of Onset  . Coronary artery disease    . Heart disease Father      Prior to Admission medications   Medication Sig Start Date End Date Taking? Authorizing Provider  calcium-vitamin D (OSCAL WITH D) 500-200 MG-UNIT per tablet Take 1 tablet by mouth daily.     Yes Historical Provider, MD  clindamycin (CLEOCIN) 300 MG capsule Take 1 capsule (300 mg total) by mouth  4 (four) times daily. X 10 days 11/19/13  Yes Hayden Rasmussen, NP  ergocalciferol (VITAMIN D2) 50000 UNITS capsule Take 50,000 Units by mouth 2 (two) times a week. Takes on different days of the week   Yes Historical Provider, MD  esomeprazole (NEXIUM) 40 MG capsule Take 1 capsule (40 mg total) by mouth daily before breakfast. 05/20/12  Yes Stacie Glaze, MD  meclizine (ANTIVERT) 25 MG tablet Take 12.5 mg by mouth 3 (three) times daily as needed for dizziness.   Yes Historical Provider, MD  Olmesartan-Amlodipine-HCTZ (TRIBENZOR) 40-10-12.5 MG TABS Take 1 tablet by mouth daily. 08/30/13  Yes Stacie Glaze, MD  Polyethylene Glycol 3350 (MIRALAX PO) Take 17 g by mouth at bedtime.    Yes Historical Provider, MD  rotigotine (NEUPRO) 2 MG/24HR Place 1 patch onto the skin at bedtime. 08/30/13  Yes Stacie Glaze, MD  traMADol (ULTRAM) 50 MG tablet Take 50 mg by mouth at bedtime as needed. For pain   Yes Historical Provider, MD  TraMADol HCl 300 MG CP24 Take 300 mg by mouth every morning. 03/16/13  Yes Stacie Glaze, MD  triamcinolone (KENALOG) 0.025 % ointment Apply 1 application topically 2 (two) times daily. 11/02/13  Yes Lonia Skinner, MD   Physical Exam: Filed Vitals:   11/27/13 1442  BP: 129/62  Pulse: 66  Temp:   Resp: 18  BP 129/62  Pulse 66  Temp(Src) 98.2 F (36.8 C) (Oral)  Resp 18  SpO2 99%  General:  Appears calm and comfortable Eyes: PERRL, normal lids, irises & conjunctiva ENT: grossly normal hearing, lips & tongue Neck: no LAD, masses or thyromegaly Cardiovascular: RRR, no m/r/g. No LE edema. Telemetry: SR, no arrhythmias  Respiratory: CTA bilaterally, no w/r/r. Normal respiratory effort. Abdomen: soft, ntnd Skin: right lower extremity with redness extending up to mid calf, multiple dry  lesion, dry skin, open wound.  Musculoskeletal: grossly normal tone BUE/BLE Psychiatric: grossly normal mood and affect, speech fluent and appropriate Neurologic: grossly non-focal.           Labs on Admission:  Basic Metabolic Panel:  Recent Labs Lab 11/27/13 1250  NA 134*  K 3.7  CL 99  CO2 26  GLUCOSE 102*  BUN 13  CREATININE 0.60  CALCIUM 8.8   Liver Function Tests: No results found for this basename: AST, ALT, ALKPHOS, BILITOT, PROT, ALBUMIN,  in the last 168 hours No results found for this basename: LIPASE, AMYLASE,  in the last 168 hours No results found for this basename: AMMONIA,  in the last 168 hours CBC:  Recent Labs Lab 11/27/13 1250  WBC 9.3  NEUTROABS 5.4  HGB 12.1  HCT 36.3  MCV 88.1  PLT 360   Cardiac Enzymes: No results found for this basename: CKTOTAL, CKMB, CKMBINDEX, TROPONINI,  in the last 168 hours  BNP (last 3 results) No results found for this basename: PROBNP,  in the last 8760 hours CBG: No results found for this basename: GLUCAP,  in the last 168 hours  Radiological Exams on Admission: Dg Ankle Complete Right  11/27/2013   CLINICAL DATA:  Right ankle wound/infection medially  EXAM: RIGHT ANKLE - COMPLETE 3+ VIEW  COMPARISON:  None  FINDINGS: Osseous demineralization.  Narrowing of ankle mortise.  Diffuse soft tissue swelling.  No acute fracture, dislocation or bone destruction.  Specifically no cortical loss is seen at the medial aspect of the ankle to suggest osteomyelitis.  Anterior process fluid calcaneus is poorly visualized on the lateral view.  IMPRESSION: Osseous demineralization.  No definite acute bony findings.  If there is persistent clinical concern for osteomyelitis recommend MR imaging.   Electronically Signed   By: Ulyses Southward M.D.   On: 11/27/2013 13:57   [place] Extrem Low Right Comp  11/27/2013   CLINICAL DATA:  Status post dog bite 2 months ago with swelling about the right ankle. Question abscess.  EXAM: RIGHT LOWER EXTREMITY SOFT TISSUE ULTRASOUND COMPLETE  TECHNIQUE: Ultrasound examination was performed including evaluation of the muscles, tendons, joint, and adjacent soft tissues.  COMPARISON:  None.   FINDINGS: There is no focal fluid collection.  Subcutaneous edema is noted.  IMPRESSION: Subcutaneous edema could be due to cellulitis or dependent change. Negative for abscess.   Electronically Signed   By: Drusilla Kanner M.D.   On: 11/27/2013 14:02    EKG: No EKG done.   Assessment/Plan Principal Problem:   Cellulitis of right lower extremity Active Problems:   HYPERTENSION   CHRONIC OBSTRUCTIVE PULMONARY DISEASE, MODERATE  1-Right LE cellulitis with open wound after a dog bite: Admit for IV antibiotics. I will start IV vancomycin and Zosyn. US show: Subcutaneous edema could be due to cellulitis or dependent change. Negative for abscess. Wound care consulted.   2-Hypertensin: Hold BP medications, SBP in the 120 range.     Code Status: Full Code.  Family Communication: Care discussed  with patient.  Disposition Plan: Expect 4 to 5 days.   Time spent: 75 minute.   [facility] Pager [phone]

## 2013-11-27 NOTE — Progress Notes (Signed)
ANTIBIOTIC CONSULT NOTE - INITIAL  Pharmacy Consult for zosyn and vancomycin Indication: RLE cellulitis  Allergies  Allergen Reactions  . Codeine Other (See Comments)    REACTION: Syncope     Vital Signs: Temp: 98.2 F (36.8 C) (12/27 1141) Temp src: Oral (12/27 1141) BP: 129/62 mmHg (12/27 1442) Pulse Rate: 66 (12/27 1442) Intake/Output from previous day:   Intake/Output from this shift: Total I/O In: 200 [I.V.:200] Out: -   Labs:  Recent Labs  11/27/13 1250  WBC 9.3  HGB 12.1  PLT 360  CREATININE 0.60   The CrCl is unknown because both a height and weight (above a minimum accepted value) are required for this calculation. No results found for this basename: VANCOTROUGH, VANCOPEAK, VANCORANDOM, GENTTROUGH, GENTPEAK, GENTRANDOM, TOBRATROUGH, TOBRAPEAK, TOBRARND, AMIKACINPEAK, AMIKACINTROU, AMIKACIN,  in the last 72 hours   Microbiology: No results found for this or any previous visit (from the past 720 hour(s)).  Medical History: Past Medical History  Diagnosis Date  . Thyroid disease   . Restless leg syndrome   . Osteoporosis   . Constipation   . Anemia   . Hypertension     Medications:  See med rec  Assessment: Patient is an 79 y.o F who was bitten by a dog a couple of weeks ago.  She was on clindamycin PTA and now presented to ED with c/o worsening of right ankle infection.  To start vancomycin and zosyn for RLE cellulitis.  Patient received vancomycin 1mg  IV in the ED at 3PM.  Plan:  1) zosyn 3.375gm IV x1 over 30 min, then 3.375gm IV q8h (infuse over 4 hours) 2) vancomycin 1250mg  IV q24h  Edgard Debord P 11/27/2013,3:37 PM

## 2013-11-27 NOTE — ED Notes (Signed)
Pt  Seen   8  Days  Ago for a  cronic   Leg   Infection  From a  Dog  Bite  Pt on  Anti  Biotics  Pt  States         Not  Any   Better

## 2013-11-27 NOTE — Consult Note (Signed)
WOC wound consult note Reason for Consult:Assess and recommend topical care for right medial LE.  Patient is s/p dog bite (2 months ago).  Radiographic studies are negative for osteo and for abscess formation. Wound type:traumatic Pressure Ulcer POA: No Measurement: 5cm x 4.5cm x 0.2cm Wound bed: 75% clean, pin, moist; 25% yellow slough. Drainage (amount, consistency, odor) Small amount serous exudate, no odor Periwound:intact, erythematous.  Minimal edema. Dressing procedure/placement/frequency: I will implement a silver hydrofiber dressing both to absorb exudate and for its antimicrobial donation. I recommend she wear a TED hose on this LE for minimal compression ( ). WOC nursing team will not follow, but will remain available to this patient, the nursing and medical team.  Please re-consult if needed. Thanks, Ladona Mow, MSN, RN, GNP, [place], CWON-AP [phone])

## 2013-11-28 DIAGNOSIS — I10 Essential (primary) hypertension: Secondary | ICD-10-CM | POA: Diagnosis not present

## 2013-11-28 DIAGNOSIS — L02419 Cutaneous abscess of limb, unspecified: Secondary | ICD-10-CM | POA: Diagnosis not present

## 2013-11-28 DIAGNOSIS — J449 Chronic obstructive pulmonary disease, unspecified: Secondary | ICD-10-CM | POA: Diagnosis not present

## 2013-11-28 LAB — CBC
HCT: 34.5 % — ABNORMAL LOW (ref 36.0–46.0)
Hemoglobin: 11.2 g/dL — ABNORMAL LOW (ref 12.0–15.0)
MCH: 28.6 pg (ref 26.0–34.0)
MCHC: 32.5 g/dL (ref 30.0–36.0)
MCV: 88 fL (ref 78.0–100.0)
Platelets: 351 10*3/uL (ref 150–400)
RBC: 3.92 MIL/uL (ref 3.87–5.11)
RDW: 13.8 % (ref 11.5–15.5)
WBC: 7.8 10*3/uL (ref 4.0–10.5)

## 2013-11-28 LAB — COMPREHENSIVE METABOLIC PANEL
ALT: 9 U/L (ref 0–35)
AST: 15 U/L (ref 0–37)
Albumin: 3.1 g/dL — ABNORMAL LOW (ref 3.5–5.2)
Alkaline Phosphatase: 83 U/L (ref 39–117)
BUN: 8 mg/dL (ref 6–23)
CO2: 24 mEq/L (ref 19–32)
Calcium: 8.6 mg/dL (ref 8.4–10.5)
Chloride: 104 mEq/L (ref 96–112)
Creatinine, Ser: 0.67 mg/dL (ref 0.50–1.10)
GFR calc Af Amer: >90 mL/min (ref >90)
GFR calc non Af Amer: 81 mL/min — ABNORMAL LOW (ref >90)
Glucose, Bld: 153 mg/dL — ABNORMAL HIGH (ref 70–99)
Potassium: 3.2 mEq/L — ABNORMAL LOW (ref 3.5–5.1)
Sodium: 140 mEq/L (ref 135–145)
Total Bilirubin: 0.4 mg/dL (ref 0.3–1.2)
Total Protein: 7 g/dL (ref 6.0–8.3)

## 2013-11-28 NOTE — Progress Notes (Signed)
[facility] PROGRESS NOTE  Cheryl Atkinson ZOX:[id number] DOB: 1931-10-29 DOA: 11/27/2013 PCP: Carrie Mew, MD  Assessment/Plan:   1-Right LE cellulitis with open wound after a dog bite:  -Improving on IV vancomycin and Zosyn. US show: Subcutaneous edema could be due to cellulitis or dependent change. Negative for abscess.  -Appreciate Wound care input -No cultures sent on admission, will continue current IV antibiotics follow and if she continues to improve we'll plan to DC on oral antibiotics in a.m. 2-Hypertension -Resume outpatient medications 4-CHRONIC OBSTRUCTIVE PULMONARY DISEASE, MODERATE  -stable   Code Status: full Family Communication: Family at bedside Disposition Plan: To home when medically ready   Consultants:  none  Procedures:  none  Antibiotics:  Vancomycin 12/27>>  Zosyn 12/27>>  HPI/Subjective: Patient sitting up in chair, reports decreased leg swelling and redness.  Objective: Filed Vitals:   11/28/13 0622  BP: 139/67  Pulse: 67  Temp: 98.5 F (36.9 C)  Resp: 18    Intake/Output Summary (Last 24 hours) at 11/28/13 1318 Last data filed at 11/27/13 2344  Gross per 24 hour  Intake   1480 ml  Output      5 ml  Net   1475 ml   Filed Weights   11/28/13 0622  Weight: 74.1 kg (163 lb 5.8 oz)    Exam:  General: alert & oriented x 3 In NAD Cardiovascular: RRR, nl S1 s2 Respiratory: Moderate air movement, no wheezes Abdomen: soft +BS NT/ND, no masses palpable Extremities: No cyanosis and no edema, decreased right lower extremity edema and erythema, R. ankle area dressing clean and dry.    Data Reviewed: Basic Metabolic Panel:  Recent Labs Lab 11/27/13 1250 11/28/13 0820  NA 134* 140  K 3.7 3.2*  CL 99 104  CO2 26 24  GLUCOSE 102* 153*  BUN 13 8  CREATININE 0.60 0.67  CALCIUM 8.8 8.6   Liver Function Tests:  Recent Labs Lab 11/28/13 0820  AST 15  ALT 9  ALKPHOS 83  BILITOT 0.4  PROT 7.0  ALBUMIN  3.1*   No results found for this basename: LIPASE, AMYLASE,  in the last 168 hours No results found for this basename: AMMONIA,  in the last 168 hours CBC:  Recent Labs Lab 11/27/13 1250 11/28/13 0820  WBC 9.3 7.8  NEUTROABS 5.4  --   HGB 12.1 11.2*  HCT 36.3 34.5*  MCV 88.1 88.0  PLT 360 351   Cardiac Enzymes: No results found for this basename: CKTOTAL, CKMB, CKMBINDEX, TROPONINI,  in the last 168 hours BNP (last 3 results) No results found for this basename: PROBNP,  in the last 8760 hours CBG: No results found for this basename: GLUCAP,  in the last 168 hours  No results found for this or any previous visit (from the past 240 hour(s)).   Studies: Dg Ankle Complete Right  11/27/2013   CLINICAL DATA:  Right ankle wound/infection medially  EXAM: RIGHT ANKLE - COMPLETE 3+ VIEW  COMPARISON:  None  FINDINGS: Osseous demineralization.  Narrowing of ankle mortise.  Diffuse soft tissue swelling.  No acute fracture, dislocation or bone destruction.  Specifically no cortical loss is seen at the medial aspect of the ankle to suggest osteomyelitis.  Anterior process fluid calcaneus is poorly visualized on the lateral view.  IMPRESSION: Osseous demineralization.  No definite acute bony findings.  If there is persistent clinical concern for osteomyelitis recommend MR imaging.   Electronically Signed   By: Ulyses Southward M.D.   On:  11/27/2013 13:57   [place] Extrem Low Right Comp  11/27/2013   CLINICAL DATA:  Status post dog bite 2 months ago with swelling about the right ankle. Question abscess.  EXAM: RIGHT LOWER EXTREMITY SOFT TISSUE ULTRASOUND COMPLETE  TECHNIQUE: Ultrasound examination was performed including evaluation of the muscles, tendons, joint, and adjacent soft tissues.  COMPARISON:  None.  FINDINGS: There is no focal fluid collection.  Subcutaneous edema is noted.  IMPRESSION: Subcutaneous edema could be due to cellulitis or dependent change. Negative for abscess.   Electronically Signed    By: Drusilla Kanner M.D.   On: 11/27/2013 14:02    Scheduled Meds: . calcium-vitamin D  1 tablet Oral Daily  . docusate sodium  100 mg Oral BID  . enoxaparin (LOVENOX) injection  40 mg Subcutaneous Q24H  . pantoprazole  40 mg Oral Daily  . piperacillin-tazobactam (ZOSYN)  IV  3.375 g Intravenous Q8H  . polyethylene glycol  17 g Oral Daily  . rotigotine  1 patch Transdermal QHS  . vancomycin  1,250 mg Intravenous Q24H   Continuous Infusions: . sodium chloride 100 mL/hr (11/27/13 2344)    Principal Problem:   Cellulitis of right lower extremity Active Problems:   HYPERTENSION   CHRONIC OBSTRUCTIVE PULMONARY DISEASE, MODERATE    Time spent: 25    [facility] C  [facility] Pager [phone]. If 7PM-7AM, please contact night-coverage at [email or web address], password [facility] 11/28/2013, 1:18 PM  LOS: 1 day

## 2013-11-29 DIAGNOSIS — L02419 Cutaneous abscess of limb, unspecified: Secondary | ICD-10-CM | POA: Diagnosis not present

## 2013-11-29 DIAGNOSIS — J449 Chronic obstructive pulmonary disease, unspecified: Secondary | ICD-10-CM | POA: Diagnosis not present

## 2013-11-29 DIAGNOSIS — I10 Essential (primary) hypertension: Secondary | ICD-10-CM | POA: Diagnosis not present

## 2013-11-29 LAB — BASIC METABOLIC PANEL
BUN: 8 mg/dL (ref 6–23)
CO2: 20 mEq/L (ref 19–32)
Calcium: 8.7 mg/dL (ref 8.4–10.5)
Chloride: 106 mEq/L (ref 96–112)
Creatinine, Ser: 0.62 mg/dL (ref 0.50–1.10)
GFR calc Af Amer: >90 mL/min (ref >90)
GFR calc non Af Amer: 83 mL/min — ABNORMAL LOW (ref >90)
Glucose, Bld: 125 mg/dL — ABNORMAL HIGH (ref 70–99)
Potassium: 3.6 mEq/L (ref 3.5–5.1)
Sodium: 139 mEq/L (ref 135–145)

## 2013-11-29 MED ORDER — "AQUACEL-AG EXTRA HYDROFIBER 4""X5"" EX PADS": MEDICATED_PAD | CUTANEOUS | Status: DC

## 2013-11-29 MED ORDER — SACCHAROMYCES BOULARDII 250 MG PO CAPS: 250.0000 mg | ORAL_CAPSULE | Freq: Two times a day (BID) | ORAL | Status: DC

## 2013-11-29 MED ORDER — AMOXICILLIN-POT CLAVULANATE 875-125 MG PO TABS: 1.0000 | ORAL_TABLET | Freq: Two times a day (BID) | ORAL | Status: DC

## 2013-11-29 MED ORDER — HYDROCODONE-ACETAMINOPHEN 5-325 MG PO TABS: 1.0000 | ORAL_TABLET | ORAL | Status: DC | PRN

## 2013-11-29 NOTE — Progress Notes (Signed)
Utilization review completed.  

## 2013-11-29 NOTE — Progress Notes (Signed)
Dressing changed on RLE. AquaCel Ag+, gauze and curlex used to wrap wound. Ted hoe to secure. Swelling decreased since admission.

## 2013-11-29 NOTE — Progress Notes (Signed)
Pt discharged home. D/c instructions given. No questions verbalized. Vitals stable. 

## 2013-11-29 NOTE — Discharge Summary (Signed)
Physician Discharge Summary  Cheryl Atkinson:[id number] DOB: 01-19-1931 DOA: 11/27/2013  PCP: Carrie Mew, MD  Admit date: 11/27/2013 Discharge date: 11/29/2013  Time spent: <30 minutes  Recommendations for Outpatient Follow-up:  Follow-up Information   Follow up with Carrie Mew, MD. (In 2 weeks, call for appointment upon discharge)    Specialty:  Internal Medicine   Contact information:   [address] Christena Flake [facility] [place] [address] [phone]        Discharge Diagnoses:  Principal Problem:   Cellulitis of right lower extremity Active Problems:   HYPERTENSION   CHRONIC OBSTRUCTIVE PULMONARY DISEASE, MODERATE   Discharge Condition:improved /Stable  Diet recommendation: Heart healthy  Filed Weights   11/28/13 0622 11/29/13 0551  Weight: 74.1 kg (163 lb 5.8 oz) 74.2 kg (163 lb 9.3 oz)    History of present illness:  Pt is a 79 y.o. female with PMH significant for anemia, Hypertension, thyroid diseases, who was refer for admission by urgent care for Lower extremity cellulitis. Patient was bit by a dog on October. She saw her PCP at that time. She was prescribe antibiotics for 10 days. She didn't notice significant improvement. Her follow up with PCP is mid February. She went to urgent car on 12-19 and was prescribe clindamycin. She notice worsening redness on her right LE. The redness is spreading up to her knee.  Patient denies chest pain, dyspnea, abdominal pain, nausea, vomiting, diarrhea. She was admitted for further evaluation and management.   Hospital Course:  1-Right LE cellulitis with open wound after a dog bite:  As discussed above, upon admission she was placed on empiric antibiotics with IV vancomycin and Zosyn. An ultrasound was done and showed- Subcutaneous edema could be due to cellulitis or dependent change. Negative for abscess.  -Wound care recommended dressing changes and on followup today, improvement in the R lower leg,she has  remained afebrile and hemodynamically stable with no leukocytosis. - She'll be discharged on oral antibiotics at this time and is to followup with her PCP -She is to continue dressing changes upon discharge as directed. 2-Hypertension  -She was maintained on her outpatient medications which she is to continue upon discharge. 4-CHRONIC OBSTRUCTIVE PULMONARY DISEASE, MODERATE  -stable   Procedures:  none  Consultations:  none  Discharge Exam: Filed Vitals:   11/29/13 1248  BP: 150/60  Pulse: 69  Temp: 98.1 F (36.7 C)  Resp: 18   Exam:  General: alert & oriented x 3 In NAD  Cardiovascular: RRR, nl S1 s2  Respiratory: Moderate air movement, no wheezes  Abdomen: soft +BS NT/ND, no masses palpable  Extremities: No cyanosis and no edema, much decreased right lower extremity edema and erythema, R. Lower leg area dressing clean and dry.   Discharge Instructions  Discharge Orders   Future Appointments Provider Department Dept Phone   01/21/2014 11:00 AM Stacie Glaze, MD [facility] [place] [phone]   Future Orders Complete By Expires   Diet - low sodium heart healthy  As directed    Discharge instructions  As directed    Comments:     Dressing changes daily as directed.   Increase activity slowly  As directed        Medication List    STOP taking these medications       clindamycin 300 MG capsule  Commonly known as:  CLEOCIN      TAKE these medications       amoxicillin-clavulanate 875-125 MG per tablet  Commonly known as:  AUGMENTIN  Take 1 tablet by mouth 2 (two) times daily.     AQUACEL-AG EXTRA HYDROFIBER 4"X5" Pads  Used as directed for dressing changes daily     calcium-vitamin D 500-200 MG-UNIT per tablet  Commonly known as:  OSCAL WITH D  Take 1 tablet by mouth daily.     ergocalciferol 50000 UNITS capsule  Commonly known as:  VITAMIN D2  Take 50,000 Units by mouth 2 (two) times a week. Takes on different days of the week      esomeprazole 40 MG capsule  Commonly known as:  NEXIUM  Take 1 capsule (40 mg total) by mouth daily before breakfast.     HYDROcodone-acetaminophen 5-325 MG per tablet  Commonly known as:  NORCO/VICODIN  Take 1-2 tablets by mouth every 4 (four) hours as needed for moderate pain.     meclizine 25 MG tablet  Commonly known as:  ANTIVERT  Take 12.5 mg by mouth 3 (three) times daily as needed for dizziness.     MIRALAX PO  Take 17 g by mouth at bedtime.     Olmesartan-Amlodipine-HCTZ 40-10-12.5 MG Tabs  Commonly known as:  TRIBENZOR  Take 1 tablet by mouth daily.     rotigotine 2 MG/24HR  Commonly known as:  NEUPRO  Place 1 patch onto the skin at bedtime.     saccharomyces boulardii 250 MG capsule  Commonly known as:  FLORASTOR  Take 1 capsule (250 mg total) by mouth 2 (two) times daily.     traMADol 50 MG tablet  Commonly known as:  ULTRAM  Take 50 mg by mouth at bedtime as needed. For pain     triamcinolone 0.025 % ointment  Commonly known as:  KENALOG  Apply 1 application topically 2 (two) times daily.       Allergies  Allergen Reactions  . Codeine Other (See Comments)    REACTION: Syncope        Follow-up Information   Follow up with Carrie Mew, MD. (In 2 weeks, call for appointment upon discharge)    Specialty:  Internal Medicine   Contact information:   [address] Christena Flake [facility] [place] [address] [phone]        The results of significant diagnostics from this hospitalization (including imaging, microbiology, ancillary and laboratory) are listed below for reference.    Significant Diagnostic Studies: Dg Ankle Complete Right  11/27/2013   CLINICAL DATA:  Right ankle wound/infection medially  EXAM: RIGHT ANKLE - COMPLETE 3+ VIEW  COMPARISON:  None  FINDINGS: Osseous demineralization.  Narrowing of ankle mortise.  Diffuse soft tissue swelling.  No acute fracture, dislocation or bone destruction.  Specifically no cortical loss is seen at the  medial aspect of the ankle to suggest osteomyelitis.  Anterior process fluid calcaneus is poorly visualized on the lateral view.  IMPRESSION: Osseous demineralization.  No definite acute bony findings.  If there is persistent clinical concern for osteomyelitis recommend MR imaging.   Electronically Signed   By: Ulyses Southward M.D.   On: 11/27/2013 13:57   [place] Extrem Low Right Comp  11/27/2013   CLINICAL DATA:  Status post dog bite 2 months ago with swelling about the right ankle. Question abscess.  EXAM: RIGHT LOWER EXTREMITY SOFT TISSUE ULTRASOUND COMPLETE  TECHNIQUE: Ultrasound examination was performed including evaluation of the muscles, tendons, joint, and adjacent soft tissues.  COMPARISON:  None.  FINDINGS: There is no focal fluid collection.  Subcutaneous edema is noted.  IMPRESSION: Subcutaneous edema could be due  to cellulitis or dependent change. Negative for abscess.   Electronically Signed   By: Drusilla Kanner M.D.   On: 11/27/2013 14:02    Microbiology: No results found for this or any previous visit (from the past 240 hour(s)).   Labs: Basic Metabolic Panel:  Recent Labs Lab 11/27/13 1250 11/28/13 0820 11/29/13 1500  NA 134* 140 139  K 3.7 3.2* 3.6  CL 99 104 106  CO2 26 24 20   GLUCOSE 102* 153* 125*  BUN 13 8 8   CREATININE 0.60 0.67 0.62  CALCIUM 8.8 8.6 8.7   Liver Function Tests:  Recent Labs Lab 11/28/13 0820  AST 15  ALT 9  ALKPHOS 83  BILITOT 0.4  PROT 7.0  ALBUMIN 3.1*   No results found for this basename: LIPASE, AMYLASE,  in the last 168 hours No results found for this basename: AMMONIA,  in the last 168 hours CBC:  Recent Labs Lab 11/27/13 1250 11/28/13 0820  WBC 9.3 7.8  NEUTROABS 5.4  --   HGB 12.1 11.2*  HCT 36.3 34.5*  MCV 88.1 88.0  PLT 360 351   Cardiac Enzymes: No results found for this basename: CKTOTAL, CKMB, CKMBINDEX, TROPONINI,  in the last 168 hours BNP: BNP (last 3 results) No results found for this basename: PROBNP,  in  the last 8760 hours CBG: No results found for this basename: GLUCAP,  in the last 168 hours     Signed:  Miho Monda C  [facility] 11/29/2013, 4:51 PM

## 2013-12-01 ENCOUNTER — Telehealth: Payer: Self-pay | Admitting: *Deleted

## 2013-12-01 NOTE — Telephone Encounter (Signed)
Transitional care  Admit date:11/27/2013 Discharge date: 11/29/2013  Discharge diagnoses: principal problem:  cellulitis of right lower extremity Active problems:   Hypertension   COPD,moderate  Recommendations for outpatient follow-up: Follow-up information  Follow up with Darryll Capers in 2 weeks-made  Discharge condition:improved/stable  Talked with patient and she states she is feeling much better and cellulitis of rt lower extremity is beginning to improve. She is changing wound area aquacel-ag daily and her son is also helping her do that.  Writer has called advanced care to talk with Efraim Kaufmann to see the possibility of home health coming out a few times a week to help with dressing changes. No call back yet.  Pt is compliant with her medication and taking her augmentin as ordered plus probiotic to help with stomach.  Pt is ambulating around house,but also tries to keep leg elevated while in house.  There is slight swelling in leg per patient.  Patient has a follow up appointment with Padonda Campbell,np on 12-13-2012 and pt is aware.

## 2013-12-03 ENCOUNTER — Other Ambulatory Visit: Payer: Self-pay | Admitting: *Deleted

## 2013-12-03 DIAGNOSIS — L0390 Cellulitis, unspecified: Secondary | ICD-10-CM

## 2013-12-04 DIAGNOSIS — M48061 Spinal stenosis, lumbar region without neurogenic claudication: Secondary | ICD-10-CM | POA: Diagnosis not present

## 2013-12-04 DIAGNOSIS — M810 Age-related osteoporosis without current pathological fracture: Secondary | ICD-10-CM | POA: Diagnosis not present

## 2013-12-04 DIAGNOSIS — M545 Low back pain, unspecified: Secondary | ICD-10-CM | POA: Diagnosis not present

## 2013-12-04 DIAGNOSIS — Z4800 Encounter for change or removal of nonsurgical wound dressing: Secondary | ICD-10-CM | POA: Diagnosis not present

## 2013-12-04 DIAGNOSIS — I10 Essential (primary) hypertension: Secondary | ICD-10-CM | POA: Diagnosis not present

## 2013-12-04 DIAGNOSIS — D509 Iron deficiency anemia, unspecified: Secondary | ICD-10-CM | POA: Diagnosis not present

## 2013-12-04 DIAGNOSIS — J441 Chronic obstructive pulmonary disease with (acute) exacerbation: Secondary | ICD-10-CM | POA: Diagnosis not present

## 2013-12-04 DIAGNOSIS — L02419 Cutaneous abscess of limb, unspecified: Secondary | ICD-10-CM | POA: Diagnosis not present

## 2013-12-07 DIAGNOSIS — L02419 Cutaneous abscess of limb, unspecified: Secondary | ICD-10-CM | POA: Diagnosis not present

## 2013-12-07 DIAGNOSIS — J441 Chronic obstructive pulmonary disease with (acute) exacerbation: Secondary | ICD-10-CM | POA: Diagnosis not present

## 2013-12-07 DIAGNOSIS — M810 Age-related osteoporosis without current pathological fracture: Secondary | ICD-10-CM | POA: Diagnosis not present

## 2013-12-07 DIAGNOSIS — M545 Low back pain, unspecified: Secondary | ICD-10-CM | POA: Diagnosis not present

## 2013-12-07 DIAGNOSIS — I10 Essential (primary) hypertension: Secondary | ICD-10-CM | POA: Diagnosis not present

## 2013-12-07 DIAGNOSIS — L03119 Cellulitis of unspecified part of limb: Secondary | ICD-10-CM | POA: Diagnosis not present

## 2013-12-07 DIAGNOSIS — M48061 Spinal stenosis, lumbar region without neurogenic claudication: Secondary | ICD-10-CM | POA: Diagnosis not present

## 2013-12-09 DIAGNOSIS — L02419 Cutaneous abscess of limb, unspecified: Secondary | ICD-10-CM | POA: Diagnosis not present

## 2013-12-09 DIAGNOSIS — M810 Age-related osteoporosis without current pathological fracture: Secondary | ICD-10-CM | POA: Diagnosis not present

## 2013-12-09 DIAGNOSIS — M48061 Spinal stenosis, lumbar region without neurogenic claudication: Secondary | ICD-10-CM | POA: Diagnosis not present

## 2013-12-09 DIAGNOSIS — M545 Low back pain, unspecified: Secondary | ICD-10-CM | POA: Diagnosis not present

## 2013-12-09 DIAGNOSIS — J441 Chronic obstructive pulmonary disease with (acute) exacerbation: Secondary | ICD-10-CM | POA: Diagnosis not present

## 2013-12-09 DIAGNOSIS — I10 Essential (primary) hypertension: Secondary | ICD-10-CM | POA: Diagnosis not present

## 2013-12-13 ENCOUNTER — Ambulatory Visit (INDEPENDENT_AMBULATORY_CARE_PROVIDER_SITE_OTHER)
Admission: RE | Admit: 2013-12-13 | Discharge: 2013-12-13 | Disposition: A | Discharge status: Home / Self Care | Payer: Medicare Other | Source: Ambulatory Visit | Attending: Family | Admitting: Family

## 2013-12-13 ENCOUNTER — Ambulatory Visit (INDEPENDENT_AMBULATORY_CARE_PROVIDER_SITE_OTHER): Payer: Medicare Other | Admitting: Family

## 2013-12-13 ENCOUNTER — Encounter: Payer: Self-pay | Admitting: Family

## 2013-12-13 VITALS — BP 132/80 | HR 69 | Temp 98.5°F | Wt 164.0 lb

## 2013-12-13 DIAGNOSIS — L03119 Cellulitis of unspecified part of limb: Secondary | ICD-10-CM | POA: Diagnosis not present

## 2013-12-13 DIAGNOSIS — M25511 Pain in right shoulder: Secondary | ICD-10-CM

## 2013-12-13 DIAGNOSIS — L02419 Cutaneous abscess of limb, unspecified: Secondary | ICD-10-CM | POA: Diagnosis not present

## 2013-12-13 DIAGNOSIS — L03116 Cellulitis of left lower limb: Secondary | ICD-10-CM

## 2013-12-13 DIAGNOSIS — M25519 Pain in unspecified shoulder: Secondary | ICD-10-CM | POA: Diagnosis not present

## 2013-12-13 DIAGNOSIS — M19019 Primary osteoarthritis, unspecified shoulder: Secondary | ICD-10-CM | POA: Diagnosis not present

## 2013-12-13 NOTE — Progress Notes (Signed)
Subjective:    Patient ID: Cheryl ReeseBetty L Davtyan, female    DOB: [birth year], 80 y.o.   MRN: [id number]  HPI   80 year old caucasian female, nonsmoker presenting for follow-up hospitalization for healing cellulitis s/p  dogbite. Patient was bit by her dog in October.  She went to the ER on 12/19 and 12/27 for cellulitus.  She stayed in hospital on 12/27 for IV and  I &D.  She finished last dose of Augmentin on Friday.  Home health nurse has been following her at home and changing dressing.  Dressing is  Aquacel.  She has right shoulder pain and is has limited movement.  She has never had an xray. Has had cortisone injections that help. Pain 6/10, worse with movement.     Review of Systems  Constitutional: Negative.   Respiratory: Negative.   Cardiovascular: Negative.   Gastrointestinal: Negative.   Endocrine: Negative.   Genitourinary: Negative.   Musculoskeletal: Negative.        Right shoulder pain and limited movement  Skin: Positive for wound.       Right lower leg   Allergic/Immunologic: Negative.   Neurological: Negative.   Hematological: Negative.   Psychiatric/Behavioral: Negative.    Past Medical History  Diagnosis Date  . Thyroid disease   . Restless leg syndrome   . Osteoporosis   . Constipation   . Anemia   . Hypertension     History   Social History  . Marital Status: Widowed    Spouse Name: N/A    Number of Children: N/A  . Years of Education: N/A   Occupational History  . Not on file.   Social History Main Topics  . Smoking status: Current Some Day Smoker -- 0.50 packs/day    Types: Cigarettes  . Smokeless tobacco: Never Used  . Alcohol Use: No  . Drug Use: No  . Sexual Activity: No   Other Topics Concern  . Not on file   Social History Narrative   Regular exercise: none   Caffeine use: cup of coffee     Past Surgical History  Procedure Laterality Date  . Appendectomy    . Abdominal hysterectomy    . Tonsillectomy    . Eye surgery    .  Cataract extraction      Family History  Problem Relation Age of Onset  . Coronary artery disease    . Heart disease Father     Allergies  Allergen Reactions  . Codeine Other (See Comments)    REACTION: Syncope     Current Outpatient Prescriptions on File Prior to Visit  Medication Sig Dispense Refill  . amoxicillin-clavulanate (AUGMENTIN) 875-125 MG per tablet Take 1 tablet by mouth 2 (two) times daily.  20 tablet  0  . calcium-vitamin D (OSCAL WITH D) 500-200 MG-UNIT per tablet Take 1 tablet by mouth daily.        . ergocalciferol (VITAMIN D2) 50000 UNITS capsule Take 50,000 Units by mouth 2 (two) times a week. Takes on different days of the week      . esomeprazole (NEXIUM) 40 MG capsule Take 1 capsule (40 mg total) by mouth daily before breakfast.  30 capsule  11  . HYDROcodone-acetaminophen (NORCO/VICODIN) 5-325 MG per tablet Take 1-2 tablets by mouth every 4 (four) hours as needed for moderate pain.  30 tablet  0  . meclizine (ANTIVERT) 25 MG tablet Take 12.5 mg by mouth 3 (three) times daily as needed for dizziness.      .Marland Kitchen  Olmesartan-Amlodipine-HCTZ (TRIBENZOR) 40-10-12.5 MG TABS Take 1 tablet by mouth daily.  90 tablet  3  . Polyethylene Glycol 3350 (MIRALAX PO) Take 17 g by mouth at bedtime.       . rotigotine (NEUPRO) 2 MG/24HR Place 1 patch onto the skin at bedtime.  90 patch  3  . saccharomyces boulardii (FLORASTOR) 250 MG capsule Take 1 capsule (250 mg total) by mouth 2 (two) times daily.  30 capsule  0  . Silver-Carboxymethylcellulose (AQUACEL-AG EXTRA HYDROFIBER) 4"X5" PADS Used as directed for dressing changes daily  20 each  0  . traMADol (ULTRAM) 50 MG tablet Take 50 mg by mouth at bedtime as needed. For pain      . triamcinolone (KENALOG) 0.025 % ointment Apply 1 application topically 2 (two) times daily.  80 g  0   No current facility-administered medications on file prior to visit.    BP 132/80  Pulse 69  Temp(Src) 98.5 F (36.9 C) (Oral)  Wt 164 lb (74.39  kg)chart    Objective:   Physical Exam  Constitutional: She is oriented to person, place, and time. She appears well-developed and well-nourished.  Neck: Normal range of motion. Neck supple.  Cardiovascular: Normal rate, regular rhythm and normal heart sounds.   Pulmonary/Chest: Effort normal and breath sounds normal.  Musculoskeletal:  Shoulder pain with limited movement  Neurological: She is alert and oriented to person, place, and time.  Skin: Skin is warm and dry.  Right lower leg wound pink.  No swelling or drainage noted          Assessment & Plan:  Assessment 1. Healing Cellulitis 2. Right shoulder pain  Plan 1. Continue dressing change with home health nurse x1 week.  2. Return to office for follow up.  3. Eucerin or Aquaphor for dry skin.  4.  Refer to [facility] office for xray of Right shoulder.

## 2013-12-13 NOTE — Patient Instructions (Signed)
1. Continue Home health x 1 week 2. Continue Aquacel x 1 week 3. Eucerin or Aquaphor applied to the dry skin of the leg twice a day.  4. Recheck here in 1 week.

## 2013-12-14 ENCOUNTER — Other Ambulatory Visit: Payer: Self-pay | Admitting: Family

## 2013-12-14 DIAGNOSIS — I10 Essential (primary) hypertension: Secondary | ICD-10-CM | POA: Diagnosis not present

## 2013-12-14 DIAGNOSIS — M545 Low back pain, unspecified: Secondary | ICD-10-CM | POA: Diagnosis not present

## 2013-12-14 DIAGNOSIS — M810 Age-related osteoporosis without current pathological fracture: Secondary | ICD-10-CM | POA: Diagnosis not present

## 2013-12-14 DIAGNOSIS — J441 Chronic obstructive pulmonary disease with (acute) exacerbation: Secondary | ICD-10-CM | POA: Diagnosis not present

## 2013-12-14 DIAGNOSIS — L02419 Cutaneous abscess of limb, unspecified: Secondary | ICD-10-CM | POA: Diagnosis not present

## 2013-12-14 DIAGNOSIS — M48061 Spinal stenosis, lumbar region without neurogenic claudication: Secondary | ICD-10-CM | POA: Diagnosis not present

## 2013-12-14 DIAGNOSIS — M25511 Pain in right shoulder: Secondary | ICD-10-CM

## 2013-12-16 ENCOUNTER — Other Ambulatory Visit: Payer: Self-pay | Admitting: *Deleted

## 2013-12-16 DIAGNOSIS — M810 Age-related osteoporosis without current pathological fracture: Secondary | ICD-10-CM | POA: Diagnosis not present

## 2013-12-16 DIAGNOSIS — M48061 Spinal stenosis, lumbar region without neurogenic claudication: Secondary | ICD-10-CM | POA: Diagnosis not present

## 2013-12-16 DIAGNOSIS — I10 Essential (primary) hypertension: Secondary | ICD-10-CM | POA: Diagnosis not present

## 2013-12-16 DIAGNOSIS — M545 Low back pain, unspecified: Secondary | ICD-10-CM | POA: Diagnosis not present

## 2013-12-16 DIAGNOSIS — L02419 Cutaneous abscess of limb, unspecified: Secondary | ICD-10-CM | POA: Diagnosis not present

## 2013-12-16 DIAGNOSIS — J441 Chronic obstructive pulmonary disease with (acute) exacerbation: Secondary | ICD-10-CM | POA: Diagnosis not present

## 2013-12-16 MED ORDER — AMOXICILLIN-POT CLAVULANATE ER 1000-62.5 MG PO TB12: 2.0000 | ORAL_TABLET | Freq: Two times a day (BID) | ORAL | Status: DC

## 2013-12-17 ENCOUNTER — Telehealth: Payer: Self-pay | Admitting: *Deleted

## 2013-12-17 NOTE — Telephone Encounter (Signed)
Home health nurse called stating cellulitis of lower leg is worse, with increased redness and swelling and throbbing pain. Also has some rash around site. Per dr Lovell Sheehanjenkins start augmentin xl 1000 2 tabs bid for 10 days and continue present wound care

## 2013-12-20 ENCOUNTER — Encounter: Payer: Self-pay | Admitting: Family

## 2013-12-20 ENCOUNTER — Ambulatory Visit (INDEPENDENT_AMBULATORY_CARE_PROVIDER_SITE_OTHER): Payer: Medicare Other | Admitting: Family

## 2013-12-20 VITALS — BP 118/76 | HR 77 | Wt 161.7 lb

## 2013-12-20 DIAGNOSIS — T148XXA Other injury of unspecified body region, initial encounter: Secondary | ICD-10-CM

## 2013-12-20 DIAGNOSIS — W540XXA Bitten by dog, initial encounter: Secondary | ICD-10-CM

## 2013-12-20 DIAGNOSIS — B354 Tinea corporis: Secondary | ICD-10-CM

## 2013-12-20 MED ORDER — CLOTRIMAZOLE-BETAMETHASONE 1-0.05 % EX CREA: 1.0000 "application " | TOPICAL_CREAM | Freq: Two times a day (BID) | CUTANEOUS | Status: DC

## 2013-12-20 NOTE — Progress Notes (Signed)
Pre visit review using our clinic review tool, if applicable. No additional management support is needed unless otherwise documented below in the visit note. 

## 2013-12-20 NOTE — Progress Notes (Signed)
Subjective:    Patient ID: Cheryl Atkinson, female    DOB: 01/19/1931, 80 y.o.   MRN: [id number]  HPI  80 year old caucasian female, nonsmoker presenting for follow-up for dog-bite.  Seen about one week ago.  Patient still taking her antibiotics and doing well. Wound completely healed without drainage.  Applying lotion to lower extremities.    Review of Systems  Constitutional: Negative.   HENT: Negative.   Eyes: Negative.   Respiratory: Negative.   Cardiovascular: Negative.   Gastrointestinal: Negative.   Endocrine: Negative.   Genitourinary: Negative.   Musculoskeletal: Negative.   Skin: Positive for color change and rash.       Redness still present on right leg  Allergic/Immunologic: Negative.   Neurological: Negative.   Hematological: Negative.   Psychiatric/Behavioral: Negative.    Past Medical History  Diagnosis Date  . Thyroid disease   . Restless leg syndrome   . Osteoporosis   . Constipation   . Anemia   . Hypertension     History   Social History  . Marital Status: Widowed    Spouse Name: N/A    Number of Children: N/A  . Years of Education: N/A   Occupational History  . Not on file.   Social History Main Topics  . Smoking status: Current Some Day Smoker -- 0.50 packs/day    Types: Cigarettes  . Smokeless tobacco: Never Used  . Alcohol Use: No  . Drug Use: No  . Sexual Activity: No   Other Topics Concern  . Not on file   Social History Narrative   Regular exercise: none   Caffeine use: cup of coffee     Past Surgical History  Procedure Laterality Date  . Appendectomy    . Abdominal hysterectomy    . Tonsillectomy    . Eye surgery    . Cataract extraction      Family History  Problem Relation Age of Onset  . Coronary artery disease    . Heart disease Father     Allergies  Allergen Reactions  . Codeine Other (See Comments)    REACTION: Syncope     Current Outpatient Prescriptions on File Prior to Visit  Medication Sig  Dispense Refill  . amoxicillin-clavulanate (AUGMENTIN XR) 1000-62.5 MG per tablet Take 2 tablets by mouth 2 (two) times daily.  40 tablet  0  . amoxicillin-clavulanate (AUGMENTIN) 875-125 MG per tablet Take 1 tablet by mouth 2 (two) times daily.  20 tablet  0  . calcium-vitamin D (OSCAL WITH D) 500-200 MG-UNIT per tablet Take 1 tablet by mouth daily.        . ergocalciferol (VITAMIN D2) 50000 UNITS capsule Take 50,000 Units by mouth 2 (two) times a week. Takes on different days of the week      . esomeprazole (NEXIUM) 40 MG capsule Take 1 capsule (40 mg total) by mouth daily before breakfast.  30 capsule  11  . HYDROcodone-acetaminophen (NORCO/VICODIN) 5-325 MG per tablet Take 1-2 tablets by mouth every 4 (four) hours as needed for moderate pain.  30 tablet  0  . meclizine (ANTIVERT) 25 MG tablet Take 12.5 mg by mouth 3 (three) times daily as needed for dizziness.      . Olmesartan-Amlodipine-HCTZ (TRIBENZOR) 40-10-12.5 MG TABS Take 1 tablet by mouth daily.  90 tablet  3  . Polyethylene Glycol 3350 (MIRALAX PO) Take 17 g by mouth at bedtime.       . rotigotine (NEUPRO) 2 MG/24HR Place  1 patch onto the skin at bedtime.  90 patch  3  . saccharomyces boulardii (FLORASTOR) 250 MG capsule Take 1 capsule (250 mg total) by mouth 2 (two) times daily.  30 capsule  0  . Silver-Carboxymethylcellulose (AQUACEL-AG EXTRA HYDROFIBER) 4"X5" PADS Used as directed for dressing changes daily  20 each  0  . traMADol (ULTRAM) 50 MG tablet Take 50 mg by mouth at bedtime as needed. For pain      . triamcinolone (KENALOG) 0.025 % ointment Apply 1 application topically 2 (two) times daily.  80 g  0   No current facility-administered medications on file prior to visit.    BP 118/76  Pulse 77  Wt 161 lb 11.2 oz (73.347 kg)chart    Objective:   Physical Exam  Constitutional: She is oriented to person, place, and time. She appears well-developed and well-nourished.  Cardiovascular: Normal rate, regular rhythm and  normal heart sounds.   Pulmonary/Chest: Effort normal and breath sounds normal.  Neurological: She is alert and oriented to person, place, and time.  Skin: Skin is warm and dry. Rash noted.  Right lower extremity dry, mildly red to the surface of the skin with a tinea appearance. No swelling.    Psychiatric: She has a normal mood and affect.          Assessment & Plan:  Assessment 1.  Tinea Corpus 2.  Dog-Bite 3. Wound right leg-healed  Plan 1. Lotrisone cream BID.  2. Continue antibiotics until complete. 3. Contact office with questions or concerns.

## 2013-12-21 DIAGNOSIS — L02419 Cutaneous abscess of limb, unspecified: Secondary | ICD-10-CM | POA: Diagnosis not present

## 2013-12-21 DIAGNOSIS — M810 Age-related osteoporosis without current pathological fracture: Secondary | ICD-10-CM | POA: Diagnosis not present

## 2013-12-21 DIAGNOSIS — J441 Chronic obstructive pulmonary disease with (acute) exacerbation: Secondary | ICD-10-CM | POA: Diagnosis not present

## 2013-12-21 DIAGNOSIS — M48061 Spinal stenosis, lumbar region without neurogenic claudication: Secondary | ICD-10-CM | POA: Diagnosis not present

## 2013-12-21 DIAGNOSIS — M545 Low back pain, unspecified: Secondary | ICD-10-CM | POA: Diagnosis not present

## 2013-12-21 DIAGNOSIS — I10 Essential (primary) hypertension: Secondary | ICD-10-CM | POA: Diagnosis not present

## 2013-12-28 DIAGNOSIS — M25819 Other specified joint disorders, unspecified shoulder: Secondary | ICD-10-CM | POA: Diagnosis not present

## 2013-12-28 DIAGNOSIS — M25519 Pain in unspecified shoulder: Secondary | ICD-10-CM | POA: Diagnosis not present

## 2013-12-29 ENCOUNTER — Telehealth: Payer: Self-pay | Admitting: Internal Medicine

## 2013-12-29 DIAGNOSIS — M810 Age-related osteoporosis without current pathological fracture: Secondary | ICD-10-CM | POA: Diagnosis not present

## 2013-12-29 DIAGNOSIS — I10 Essential (primary) hypertension: Secondary | ICD-10-CM | POA: Diagnosis not present

## 2013-12-29 DIAGNOSIS — J441 Chronic obstructive pulmonary disease with (acute) exacerbation: Secondary | ICD-10-CM | POA: Diagnosis not present

## 2013-12-29 DIAGNOSIS — M545 Low back pain, unspecified: Secondary | ICD-10-CM | POA: Diagnosis not present

## 2013-12-29 DIAGNOSIS — M48061 Spinal stenosis, lumbar region without neurogenic claudication: Secondary | ICD-10-CM | POA: Diagnosis not present

## 2013-12-29 DIAGNOSIS — L03119 Cellulitis of unspecified part of limb: Secondary | ICD-10-CM | POA: Diagnosis not present

## 2013-12-29 DIAGNOSIS — L02419 Cutaneous abscess of limb, unspecified: Secondary | ICD-10-CM | POA: Diagnosis not present

## 2013-12-29 NOTE — Telephone Encounter (Signed)
Cheryl Atkinson from [facility] calling to inform dr. Lovell SheehanJenkins pt has been discharged and pt womb is healed.

## 2014-01-21 ENCOUNTER — Ambulatory Visit (INDEPENDENT_AMBULATORY_CARE_PROVIDER_SITE_OTHER): Payer: Medicare Other | Admitting: Internal Medicine

## 2014-01-21 ENCOUNTER — Encounter: Payer: Self-pay | Admitting: Internal Medicine

## 2014-01-21 VITALS — BP 120/80 | HR 76 | Temp 98.2°F | Resp 16 | Ht 66.0 in | Wt 162.0 lb

## 2014-01-21 DIAGNOSIS — I10 Essential (primary) hypertension: Secondary | ICD-10-CM

## 2014-01-21 DIAGNOSIS — R739 Hyperglycemia, unspecified: Secondary | ICD-10-CM

## 2014-01-21 DIAGNOSIS — R634 Abnormal weight loss: Secondary | ICD-10-CM

## 2014-01-21 DIAGNOSIS — R7309 Other abnormal glucose: Secondary | ICD-10-CM

## 2014-01-21 LAB — HEMOGLOBIN A1C: Hgb A1c MFr Bld: 6.2 % (ref 4.6–6.5)

## 2014-01-21 NOTE — Patient Instructions (Signed)
You are being screened for diabetes

## 2014-01-21 NOTE — Progress Notes (Signed)
Pre visit review using our clinic review tool, if applicable. No additional management support is needed unless otherwise documented below in the visit note. 

## 2014-01-21 NOTE — Progress Notes (Signed)
Subjective:    Patient ID: Cheryl Atkinson, female    DOB: Aug 15, 1931, 80 y.o.   MRN: [id number]  HPI  Was in the wound programs for [facility] for leg ulcers From a Dog bite Had a shot on right shoulder for bursitis and did not want surgery. On tribenzor with good result and tramadol for chronic back pain And continues restless leg patches  Review of Systems  Constitutional: Positive for fatigue. Negative for activity change and appetite change.  HENT: Negative for congestion, ear pain, postnasal drip and sinus pressure.   Eyes: Negative for redness and visual disturbance.  Respiratory: Positive for shortness of breath. Negative for cough and wheezing.   Gastrointestinal: Positive for constipation. Negative for abdominal pain and abdominal distention.  Genitourinary: Negative for dysuria, frequency and menstrual problem.  Musculoskeletal: Negative for arthralgias, joint swelling, myalgias and neck pain.  Skin: Positive for wound. Negative for rash.  Neurological: Positive for weakness. Negative for dizziness and headaches.  Hematological: Negative for adenopathy. Does not bruise/bleed easily.  Psychiatric/Behavioral: Positive for dysphoric mood. Negative for sleep disturbance and decreased concentration.   Past Medical History  Diagnosis Date  . Thyroid disease   . Restless leg syndrome   . Osteoporosis   . Constipation   . Anemia   . Hypertension     History   Social History  . Marital Status: Widowed    Spouse Name: N/A    Number of Children: N/A  . Years of Education: N/A   Occupational History  . Not on file.   Social History Main Topics  . Smoking status: Current Some Day Smoker -- 0.50 packs/day    Types: Cigarettes  . Smokeless tobacco: Never Used  . Alcohol Use: No  . Drug Use: No  . Sexual Activity: No   Other Topics Concern  . Not on file   Social History Narrative   Regular exercise: none   Caffeine use: cup of coffee     Past Surgical History    Procedure Laterality Date  . Appendectomy    . Abdominal hysterectomy    . Tonsillectomy    . Eye surgery    . Cataract extraction      Family History  Problem Relation Age of Onset  . Coronary artery disease    . Heart disease Father     Allergies  Allergen Reactions  . Codeine Other (See Comments)    REACTION: Syncope     Current Outpatient Prescriptions on File Prior to Visit  Medication Sig Dispense Refill  . calcium-vitamin D (OSCAL WITH D) 500-200 MG-UNIT per tablet Take 1 tablet by mouth daily.        . clotrimazole-betamethasone (LOTRISONE) cream Apply 1 application topically 2 (two) times daily.  45 g  0  . ergocalciferol (VITAMIN D2) 50000 UNITS capsule Take 50,000 Units by mouth 2 (two) times a week. Takes on different days of the week      . esomeprazole (NEXIUM) 40 MG capsule Take 1 capsule (40 mg total) by mouth daily before breakfast.  30 capsule  11  . Olmesartan-Amlodipine-HCTZ (TRIBENZOR) 40-10-12.5 MG TABS Take 1 tablet by mouth daily.  90 tablet  3  . Polyethylene Glycol 3350 (MIRALAX PO) Take 17 g by mouth at bedtime.       . rotigotine (NEUPRO) 2 MG/24HR Place 1 patch onto the skin at bedtime.  90 patch  3  . saccharomyces boulardii (FLORASTOR) 250 MG capsule Take 1 capsule (250 mg total) by  mouth 2 (two) times daily.  30 capsule  0  . traMADol (ULTRAM) 50 MG tablet Take 50 mg by mouth at bedtime as needed. For pain       No current facility-administered medications on file prior to visit.    BP 120/80  Pulse 76  Temp(Src) 98.2 F (36.8 C)  Resp 16  Ht 5\' 6"  (1.676 m)  Wt 162 lb (73.483 kg)  BMI 26.16 kg/m2       Objective:   Physical Exam  Nursing note and vitals reviewed. Constitutional: She is oriented to person, place, and time. She appears well-developed and well-nourished. No distress.  HENT:  Head: Normocephalic and atraumatic.  Eyes: Conjunctivae and EOM are normal. Pupils are equal, round, and reactive to light.  Neck: Normal  range of motion. Neck supple. No JVD present. No tracheal deviation present. No thyromegaly present.  Cardiovascular: Normal rate, regular rhythm and intact distal pulses.   Murmur heard. Pulmonary/Chest: Effort normal and breath sounds normal. She has no wheezes. She exhibits no tenderness.  Abdominal: Soft. There is tenderness.  Soft moderately tender due to constipation on miralax  Musculoskeletal: Normal range of motion. She exhibits no edema and no tenderness.  Lymphadenopathy:    She has no cervical adenopathy.  Neurological: She is alert and oriented to person, place, and time. She has normal reflexes. No cranial nerve deficit.  Skin: Skin is warm and dry. She is not diaphoretic.  Psychiatric: She has a normal mood and affect. Her behavior is normal.          Assessment & Plan:  Stable HTN restless leg stable Monitored blood work  Due to the infection there was some elevation in blood glucose warranting screen for DM

## 2014-01-24 ENCOUNTER — Telehealth: Payer: Self-pay | Admitting: Internal Medicine

## 2014-01-24 NOTE — Telephone Encounter (Signed)
Relevant patient education mailed to patient.  

## 2014-03-03 DIAGNOSIS — M545 Low back pain, unspecified: Secondary | ICD-10-CM | POA: Diagnosis not present

## 2014-03-03 DIAGNOSIS — M47817 Spondylosis without myelopathy or radiculopathy, lumbosacral region: Secondary | ICD-10-CM | POA: Diagnosis not present

## 2014-04-05 ENCOUNTER — Emergency Department (INDEPENDENT_AMBULATORY_CARE_PROVIDER_SITE_OTHER)
Admission: EM | Admit: 2014-04-05 | Discharge: 2014-04-05 | Disposition: A | Discharge status: Home / Self Care | Payer: Medicare Other | Source: Home / Self Care | Attending: Family Medicine | Admitting: Family Medicine

## 2014-04-05 ENCOUNTER — Encounter (HOSPITAL_COMMUNITY): Payer: Self-pay | Admitting: Emergency Medicine

## 2014-04-05 DIAGNOSIS — N390 Urinary tract infection, site not specified: Secondary | ICD-10-CM

## 2014-04-05 LAB — POCT URINALYSIS DIP (DEVICE)
Bilirubin Urine: NEGATIVE
Glucose, UA: NEGATIVE mg/dL
Ketones, ur: NEGATIVE mg/dL
Nitrite: NEGATIVE
Protein, ur: 30 mg/dL — AB
Specific Gravity, Urine: 1.02 (ref 1.005–1.030)
Urobilinogen, UA: 1 mg/dL (ref 0.0–1.0)
pH: 6.5 (ref 5.0–8.0)

## 2014-04-05 MED ORDER — CEFUROXIME AXETIL 250 MG PO TABS: 250.0000 mg | ORAL_TABLET | Freq: Two times a day (BID) | ORAL | Status: DC

## 2014-04-05 NOTE — ED Provider Notes (Signed)
Medical screening examination/treatment/procedure(s) were performed by resident physician or non-physician practitioner and as supervising physician I was immediately available for consultation/collaboration.   Barkley BrunsKINDL,Dewitt Judice DOUGLAS MD.   Linna HoffJames D Doniven Vanpatten, MD 04/05/14 1128

## 2014-04-05 NOTE — ED Provider Notes (Signed)
CSN: [id number]     Arrival date & time 04/05/14  0827 History   None    Chief Complaint  Patient presents with  . Urinary Frequency   (Consider location/radiation/quality/duration/timing/severity/associated sxs/prior Treatment) HPI Comments: 80 year old female presents complaining of possible urinary tract infection. For about 10 days, she has lower abdominal discomfort, burning with urination, and headache. This is identical to previous episodes of urinary tract infection. She admits to some nausea but denies any vomiting, fever, or flank pain. She denies any vaginal discharge. She has no recent catheterizations or hospitalizations.  Patient is a 80 y.o. female presenting with frequency.  Urinary Frequency Associated symptoms include abdominal pain.    Past Medical History  Diagnosis Date  . Thyroid disease   . Restless leg syndrome   . Osteoporosis   . Constipation   . Anemia   . Hypertension    Past Surgical History  Procedure Laterality Date  . Appendectomy    . Abdominal hysterectomy    . Tonsillectomy    . Eye surgery    . Cataract extraction     Family History  Problem Relation Age of Onset  . Coronary artery disease    . Heart disease Father    History  Substance Use Topics  . Smoking status: Current Some Day Smoker -- 0.50 packs/day    Types: Cigarettes  . Smokeless tobacco: Never Used  . Alcohol Use: No   OB History   Grav Para Term Preterm Abortions TAB SAB Ect Mult Living                 Review of Systems  Constitutional: Negative for fever and chills.  Gastrointestinal: Positive for abdominal pain. Negative for nausea and vomiting.  Genitourinary: Positive for dysuria, urgency and frequency. Negative for flank pain and vaginal discharge.  All other systems reviewed and are negative.   Allergies  Codeine  Home Medications   Prior to Admission medications   Medication Sig Start Date End Date Taking? Authorizing Provider  calcium-vitamin D  (OSCAL WITH D) 500-200 MG-UNIT per tablet Take 1 tablet by mouth daily.      Historical Provider, MD  clotrimazole-betamethasone (LOTRISONE) cream Apply 1 application topically 2 (two) times daily. 12/20/13    PieriniPadonda B Campbell, FNP  ergocalciferol (VITAMIN D2) 50000 UNITS capsule Take 50,000 Units by mouth 2 (two) times a week. Takes on different days of the week    Historical Provider, MD  esomeprazole (NEXIUM) 40 MG capsule Take 1 capsule (40 mg total) by mouth daily before breakfast. 05/20/12   Stacie GlazeJohn E Jenkins, MD  Olmesartan-Amlodipine-HCTZ [facility](TRIBENZOR) 40-10-12.5 MG TABS Take 1 tablet by mouth daily. 08/30/13   Stacie GlazeJohn E Jenkins, MD  Polyethylene Glycol 3350 (MIRALAX PO) Take 17 g by mouth at bedtime.     Historical Provider, MD  rotigotine (NEUPRO) 2 MG/24HR Place 1 patch onto the skin at bedtime. 08/30/13   Stacie GlazeJohn E Jenkins, MD  saccharomyces boulardii (FLORASTOR) 250 MG capsule Take 1 capsule (250 mg total) by mouth 2 (two) times daily. 11/29/13   Kela MillinAdeline C Viyuoh, MD  traMADol (ULTRAM) 50 MG tablet Take 50 mg by mouth at bedtime as needed. For pain    Historical Provider, MD   BP 140/61  Pulse 76  Temp(Src) 98.1 F (36.7 C) (Oral)  Resp 18  SpO2 99% Physical Exam  Nursing note and vitals reviewed. Constitutional: She is oriented to person, place, and time. Vital signs are normal. She appears well-developed and well-nourished. No distress.  HENT:  Head: Normocephalic and atraumatic.  Pulmonary/Chest: Effort normal. No respiratory distress.  Abdominal: Soft. Normal appearance. She exhibits no distension. There is tenderness (mild) in the suprapubic area. There is no rigidity, no rebound, no guarding, no CVA tenderness, no tenderness at McBurney's point and negative Murphy's sign.  Neurological: She is alert and oriented to person, place, and time. She has normal strength. Coordination normal.  Skin: Skin is warm and dry. No rash noted. She is not diaphoretic.  Psychiatric: She has a normal mood  and affect. Judgment normal.    ED Course  Procedures (including critical care time) Labs Review Labs Reviewed  POCT URINALYSIS DIP (DEVICE) - Abnormal; Notable for the following:    Hgb urine dipstick SMALL (*)    Protein, ur 30 (*)    Leukocytes, UA LARGE (*)    All other components within normal limits  URINE CULTURE    Imaging Review No results found.   MDM   1. UTI (lower urinary tract infection)    Urine culture sent. Treating with Ceftin for uncomplicated UTI. Continue to push fluids by mouth. Followup when necessary.   Meds ordered this encounter  Medications  . cefUROXime (CEFTIN) 250 MG tablet    Sig: Take 1 tablet (250 mg total) by mouth 2 (two) times daily with a meal.    Dispense:  10 tablet    Refill:  0    Order Specific Question:  Supervising Provider    Answer:  Bradd CanaryKINDL, JAMES D [[id number]]       Graylon GoodZachary H Elayna Tobler, PA-C 04/05/14 [phone]

## 2014-04-05 NOTE — ED Notes (Signed)
Pt  Reports  Symptoms  Of  Urinary frequency  And   painfull  Burning  On  Urination        Symptoms  X  10  Days            unreleived  By    Home  remidies

## 2014-04-05 NOTE — Discharge Instructions (Signed)
Urinary Tract Infection  Urinary tract infections (UTIs) can develop anywhere along your urinary tract. Your urinary tract is your body's drainage system for removing wastes and extra water. Your urinary tract includes two kidneys, two ureters, a bladder, and a urethra. Your kidneys are a pair of bean-shaped organs. Each kidney is about the size of your fist. They are located below your ribs, one on each side of your spine.  CAUSES  Infections are caused by microbes, which are microscopic organisms, including fungi, viruses, and bacteria. These organisms are so small that they can only be seen through a microscope. Bacteria are the microbes that most commonly cause UTIs.  SYMPTOMS   Symptoms of UTIs may vary by age and gender of the patient and by the location of the infection. Symptoms in young women typically include a frequent and intense urge to urinate and a painful, burning feeling in the bladder or urethra during urination. Older women and men are more likely to be tired, shaky, and weak and have muscle aches and abdominal pain. A fever may mean the infection is in your kidneys. Other symptoms of a kidney infection include pain in your back or sides below the ribs, nausea, and vomiting.  DIAGNOSIS  To diagnose a UTI, your caregiver will ask you about your symptoms. Your caregiver also will ask to provide a urine sample. The urine sample will be tested for bacteria and white blood cells. White blood cells are made by your body to help fight infection.  TREATMENT   Typically, UTIs can be treated with medication. Because most UTIs are caused by a bacterial infection, they usually can be treated with the use of antibiotics. The choice of antibiotic and length of treatment depend on your symptoms and the type of bacteria causing your infection.  HOME CARE INSTRUCTIONS   If you were prescribed antibiotics, take them exactly as your caregiver instructs you. Finish the medication even if you feel better after you  have only taken some of the medication.   Drink enough water and fluids to keep your urine clear or pale yellow.   Avoid caffeine, tea, and carbonated beverages. They tend to irritate your bladder.   Empty your bladder often. Avoid holding urine for long periods of time.   Empty your bladder before and after sexual intercourse.   After a bowel movement, women should cleanse from front to back. Use each tissue only once.  SEEK MEDICAL CARE IF:    You have back pain.   You develop a fever.   Your symptoms do not begin to resolve within 3 days.  SEEK IMMEDIATE MEDICAL CARE IF:    You have severe back pain or lower abdominal pain.   You develop chills.   You have nausea or vomiting.   You have continued burning or discomfort with urination.  MAKE SURE YOU:    Understand these instructions.   Will watch your condition.   Will get help right away if you are not doing well or get worse.  Document Released: 08/28/2005 Document Revised: 05/19/2012 Document Reviewed: 12/27/2011  [employer] Patient Information 2014 [employer]

## 2014-04-08 LAB — URINE CULTURE: Colony Count: >=100000

## 2014-04-10 ENCOUNTER — Telehealth (HOSPITAL_COMMUNITY): Payer: Self-pay | Admitting: Family Medicine

## 2014-04-10 MED ORDER — AMOXICILLIN 500 MG PO CAPS: 1000.0000 mg | ORAL_CAPSULE | Freq: Two times a day (BID) | ORAL | Status: DC

## 2014-04-10 NOTE — ED Notes (Addendum)
Urine culture:>100,000 colonies Enterococcus.  Pt. treated with Ceftin.  5/9 Message to [place] Park[place] PA.  5/10  Discussed with Dr. Denyse Amassorey.  He e-prescribed Amoxicillin to [facility] on [address].  I called pt. and left a message to call.  Call 1. Desiree LucySuzanne M Assunta Pupo 04/10/2014 Pt. called back.  Pt. verified x 2 and given results.  Pt. told to stop Ceftin and take all of Amoxicillin.  She said has already finished the Ceftin. I told her to call back if she gets a yeast infection and to eat some yogurt to put the good bacteria back in her GI tract.  Pt. told where to pick up her Rx. Pt. voiced understanding. Desiree LucySuzanne M Alver Leete 04/10/2014

## 2014-04-10 NOTE — ED Notes (Signed)
Culture results show enterococcus sensitive to ampicillin. Switch to amoxicillin. RN to contact patient  Rodolph BongEvan S Romell Wolden, MD 04/11/14 [phone]

## 2014-05-01 ENCOUNTER — Encounter: Payer: Self-pay | Admitting: Gastroenterology

## 2014-07-18 ENCOUNTER — Encounter: Payer: Self-pay | Admitting: Gastroenterology

## 2014-07-19 ENCOUNTER — Encounter: Payer: Self-pay | Admitting: Family

## 2014-07-19 ENCOUNTER — Ambulatory Visit (INDEPENDENT_AMBULATORY_CARE_PROVIDER_SITE_OTHER): Payer: Medicare Other | Admitting: Family

## 2014-07-19 ENCOUNTER — Other Ambulatory Visit: Payer: Self-pay | Admitting: Internal Medicine

## 2014-07-19 VITALS — BP 118/70 | HR 77 | Temp 98.2°F | Ht 65.0 in | Wt 160.0 lb

## 2014-07-19 DIAGNOSIS — R7309 Other abnormal glucose: Secondary | ICD-10-CM

## 2014-07-19 DIAGNOSIS — R739 Hyperglycemia, unspecified: Secondary | ICD-10-CM

## 2014-07-19 DIAGNOSIS — F17200 Nicotine dependence, unspecified, uncomplicated: Secondary | ICD-10-CM | POA: Diagnosis not present

## 2014-07-19 DIAGNOSIS — I10 Essential (primary) hypertension: Secondary | ICD-10-CM | POA: Diagnosis not present

## 2014-07-19 DIAGNOSIS — K219 Gastro-esophageal reflux disease without esophagitis: Secondary | ICD-10-CM | POA: Diagnosis not present

## 2014-07-19 DIAGNOSIS — Z720 Tobacco use: Secondary | ICD-10-CM

## 2014-07-19 DIAGNOSIS — E8881 Metabolic syndrome: Secondary | ICD-10-CM | POA: Insufficient documentation

## 2014-07-19 LAB — HEPATIC FUNCTION PANEL
ALT: 13 U/L (ref 0–35)
AST: 19 U/L (ref 0–37)
Albumin: 3.7 g/dL (ref 3.5–5.2)
Alkaline Phosphatase: 90 U/L (ref 39–117)
Bilirubin, Direct: 0 mg/dL (ref 0.0–0.3)
Total Bilirubin: 0.5 mg/dL (ref 0.2–1.2)
Total Protein: 8 g/dL (ref 6.0–8.3)

## 2014-07-19 LAB — BASIC METABOLIC PANEL
BUN: 19 mg/dL (ref 6–23)
CO2: 29 mEq/L (ref 19–32)
Calcium: 9.5 mg/dL (ref 8.4–10.5)
Chloride: 101 mEq/L (ref 96–112)
Creatinine, Ser: 0.7 mg/dL (ref 0.4–1.2)
GFR: 81.34 mL/min (ref >60.00)
Glucose, Bld: 76 mg/dL (ref 70–99)
Potassium: 3.8 mEq/L (ref 3.5–5.1)
Sodium: 138 mEq/L (ref 135–145)

## 2014-07-19 LAB — HEMOGLOBIN A1C: Hgb A1c MFr Bld: 6.4 % (ref 4.6–6.5)

## 2014-07-19 NOTE — Progress Notes (Signed)
Subjective:    Patient ID: Cheryl ReeseBetty L Atkinson, female    DOB: [birth year], 80 y.o.   MRN: [id number]  HPI 80 year old white female, nonsmoker, patient of Cheryl Atkinson presents today for recheck of hypertension, GERD, tobacco abuse, and hyperglycemia. Reports doing well. Denies any concerns. She plans to establish primary care with Cheryl Atkinson.   Review of Systems  Constitutional: Negative.   HENT: Negative.   Respiratory: Negative.   Cardiovascular: Negative.  Negative for chest pain.  Gastrointestinal: Negative.   Endocrine: Negative.   Genitourinary: Negative.   Musculoskeletal: Negative.   Skin: Negative.   Neurological: Negative.   Hematological: Negative.   Psychiatric/Behavioral: Negative.    Past Medical History  Diagnosis Date  . Thyroid disease   . Restless leg syndrome   . Osteoporosis   . Constipation   . Anemia   . Hypertension     History   Social History  . Marital Status: Widowed    Spouse Name: N/A    Number of Children: N/A  . Years of Education: N/A   Occupational History  . Not on file.   Social History Main Topics  . Smoking status: Current Some Day Smoker -- 0.50 packs/day    Types: Cigarettes  . Smokeless tobacco: Never Used  . Alcohol Use: No  . Drug Use: No  . Sexual Activity: No   Other Topics Concern  . Not on file   Social History Narrative   Regular exercise: none   Caffeine use: cup of coffee     Past Surgical History  Procedure Laterality Date  . Appendectomy    . Abdominal hysterectomy    . Tonsillectomy    . Eye surgery    . Cataract extraction      Family History  Problem Relation Age of Onset  . Coronary artery disease    . Heart disease Father     Allergies  Allergen Reactions  . Codeine Other (See Comments)    REACTION: Syncope     Current Outpatient Prescriptions on File Prior to Visit  Medication Sig Dispense Refill  . esomeprazole (NEXIUM) 40 MG capsule Take 1 capsule (40 mg total) by mouth  daily before breakfast.  30 capsule  11  . Polyethylene Glycol 3350 (MIRALAX PO) Take 17 g by mouth at bedtime.       . rotigotine (NEUPRO) 2 MG/24HR Place 1 patch onto the skin at bedtime.  90 patch  3  . traMADol (ULTRAM) 50 MG tablet Take 50 mg by mouth at bedtime as needed. For pain       No current facility-administered medications on file prior to visit.    BP 118/70  Pulse 77  Temp(Src) 98.2 F (36.8 C) (Oral)  Ht 5\' 5"  (1.651 m)  Wt 160 lb (72.576 kg)  BMI 26.63 kg/m2  SpO2 94%chart    Objective:   Physical Exam  Constitutional: She is oriented to person, place, and time. She appears well-developed and well-nourished.  HENT:  Right Ear: External ear normal.  Left Ear: External ear normal.  Nose: Nose normal.  Mouth/Throat: Oropharynx is clear and moist.  Neck: Normal range of motion. Neck supple. No thyromegaly present.  Cardiovascular: Normal rate, regular rhythm and normal heart sounds.   Pulmonary/Chest: Effort normal and breath sounds normal.  Abdominal: Soft. Bowel sounds are normal.  Musculoskeletal: Normal range of motion.  Neurological: She is alert and oriented to person, place, and time.  Skin: Skin is warm and dry.  Psychiatric: She has a normal mood and affect.          Assessment & Plan:  Cheryl Atkinson was seen today for follow-up.  Diagnoses and associated orders for this visit:  Gastroesophageal reflux disease without esophagitis  Unspecified essential hypertension - Basic Metabolic Panel - Hepatic Function Panel  Hyperglycemia - Hemoglobin A1c  Tobacco abuse    followup is scheduled in October with Cheryl Atkinson. Recheck pending labs and sooner as needed. Flu shot to be given a next appointment.

## 2014-07-19 NOTE — Progress Notes (Signed)
Pre visit review using our clinic review tool, if applicable. No additional management support is needed unless otherwise documented below in the visit note. 

## 2014-07-19 NOTE — Patient Instructions (Signed)

## 2014-07-22 ENCOUNTER — Ambulatory Visit: Payer: Medicare Other | Admitting: Internal Medicine

## 2014-08-25 ENCOUNTER — Other Ambulatory Visit: Payer: Self-pay | Admitting: Internal Medicine

## 2014-09-03 DIAGNOSIS — Z23 Encounter for immunization: Secondary | ICD-10-CM | POA: Diagnosis not present

## 2014-09-08 DIAGNOSIS — Z79891 Long term (current) use of opiate analgesic: Secondary | ICD-10-CM | POA: Diagnosis not present

## 2014-09-08 DIAGNOSIS — M47817 Spondylosis without myelopathy or radiculopathy, lumbosacral region: Secondary | ICD-10-CM | POA: Diagnosis not present

## 2014-09-08 DIAGNOSIS — Z79899 Other long term (current) drug therapy: Secondary | ICD-10-CM | POA: Diagnosis not present

## 2014-09-08 DIAGNOSIS — G894 Chronic pain syndrome: Secondary | ICD-10-CM | POA: Diagnosis not present

## 2014-09-08 DIAGNOSIS — M545 Low back pain: Secondary | ICD-10-CM | POA: Diagnosis not present

## 2014-09-29 ENCOUNTER — Telehealth: Payer: Self-pay | Admitting: Internal Medicine

## 2014-09-29 ENCOUNTER — Encounter: Payer: Self-pay | Admitting: Family Medicine

## 2014-09-29 ENCOUNTER — Ambulatory Visit (INDEPENDENT_AMBULATORY_CARE_PROVIDER_SITE_OTHER): Payer: Medicare Other | Admitting: Family Medicine

## 2014-09-29 VITALS — BP 122/64 | Temp 98.5°F | Wt 159.0 lb

## 2014-09-29 DIAGNOSIS — Z66 Do not resuscitate: Secondary | ICD-10-CM | POA: Diagnosis not present

## 2014-09-29 DIAGNOSIS — I10 Essential (primary) hypertension: Secondary | ICD-10-CM | POA: Diagnosis not present

## 2014-09-29 DIAGNOSIS — G2581 Restless legs syndrome: Secondary | ICD-10-CM

## 2014-09-29 DIAGNOSIS — Z23 Encounter for immunization: Secondary | ICD-10-CM

## 2014-09-29 DIAGNOSIS — F17200 Nicotine dependence, unspecified, uncomplicated: Secondary | ICD-10-CM

## 2014-09-29 DIAGNOSIS — J449 Chronic obstructive pulmonary disease, unspecified: Secondary | ICD-10-CM

## 2014-09-29 DIAGNOSIS — Z720 Tobacco use: Secondary | ICD-10-CM

## 2014-09-29 DIAGNOSIS — Z87891 Personal history of nicotine dependence: Secondary | ICD-10-CM | POA: Insufficient documentation

## 2014-09-29 MED ORDER — OLMESARTAN-AMLODIPINE-HCTZ 40-10-12.5 MG PO TABS: ORAL_TABLET | ORAL | Status: DC

## 2014-09-29 NOTE — Assessment & Plan Note (Signed)
Strongly advised cessation. Patient is interested. She will call 1 800 quit line for support. She states it will be very helpful for her son to stop smoking and I encouraged patient not to rely on his decision.

## 2014-09-29 NOTE — Assessment & Plan Note (Signed)
Well-controlled on almost olmesartan, amlodipine, hydrochlorothiazide in combination Tribenzor form. Continue current meds

## 2014-09-29 NOTE — Progress Notes (Signed)
Cheryl ConchStephen Yuleimy Kretz, MD Phone: [phone]  Subjective:  Patient presents today to establish care with me as their new primary care provider. Patient was formerly a patient of Dr. Lovell SheehanJenkins. Chief complaint-noted.   Hypertension-well controlled  BP Readings from Last 3 Encounters:  09/29/14 122/64  07/19/14 118/70  04/05/14 140/61  Home BP monitoring-no Compliant with medications-yes without side effects ROS-Denies any CP, HA, blurry vision, LE edema. Does have DOE after about 100 feet. wobbly on feet (has had PT before for this) and has been stable for some time  COPD-suspect poor control Smoker-poor control As above, doe with walking about 100 feet. Patient was on inhalers in the past and did not think it helped her very much. She continues to smoke. She quit in the past with a hypnosis and she is interested in quitting again. She has limited transportation for classes but is interested in the 1 800 quit line. ROS- no chest pain as above. No wheeze.  Restless leg syndrome-good control Patient states she has had this for some time. She has been stable Neupro ROS-no vivid dreams  The following were reviewed and entered/updated in epic: Past Medical History  Diagnosis Date  . Thyroid disease   . Restless leg syndrome   . Osteoporosis   . Constipation   . Anemia   . Hypertension   . Cellulitis of right lower extremity 11/27/2013   Patient Active Problem List   Diagnosis Date Noted  . Smoker 09/29/2014    Priority: High  . DNR (do not resuscitate) 09/29/2014    Priority: High  . Hyperglycemia 07/19/2014    Priority: Medium  . COPD (chronic obstructive pulmonary disease) 09/20/2009    Priority: Medium  . Iron deficiency anemia 11/09/2007    Priority: Medium  . RESTLESS LEG SYNDROME 09/25/2007    Priority: Medium  . Essential hypertension 09/25/2007    Priority: Medium  . LOW BACK PAIN 09/25/2007    Priority: Medium  . Osteoporosis 09/25/2007    Priority: Medium  .  Multinodular goiter 08/30/2013    Priority: Low  . Spinal stenosis of lumbar region at multiple levels 09/29/2012    Priority: Low  . HIP PAIN, BILATERAL 07/16/2010    Priority: Low  . CONSTIPATION, CHRONIC 09/20/2009    Priority: Low  . History of UTI 11/09/2007    Priority: Low  . GERD 09/25/2007    Priority: Low   Past Surgical History  Procedure Laterality Date  . Appendectomy    . Abdominal hysterectomy    . Tonsillectomy    . Eye surgery    . Cataract extraction      Family History  Problem Relation Age of Onset  . Coronary artery disease    . Heart disease Father     Medications- reviewed and updated Current Outpatient Prescriptions  Medication Sig Dispense Refill  . esomeprazole (NEXIUM) 40 MG capsule Take 1 capsule (40 mg total) by mouth daily before breakfast.  30 capsule  11  . NEUPRO 2 MG/24HR PLACE 1 PATCH ONTO THE SKIN AT BEDTIME  90 patch  0  . Olmesartan-Amlodipine-HCTZ (TRIBENZOR) 40-10-12.5 MG TABS TAKE 1 TABLET DAILY  90 tablet  1  . Polyethylene Glycol 3350 (MIRALAX PO) Take 17 g by mouth at bedtime.       . traMADol (ULTRAM) 50 MG tablet Take 50 mg by mouth at bedtime as needed. For pain       No current facility-administered medications for this visit.    Allergies-reviewed and updated  Allergies  Allergen Reactions  . Codeine Other (See Comments)    REACTION: Syncope     History   Social History  . Marital Status: Widowed    Spouse Name: N/A    Number of Children: N/A  . Years of Education: N/A   Social History Main Topics  . Smoking status: Current Every Day Smoker -- 0.50 packs/day    Types: Cigarettes  . Smokeless tobacco: Never Used  . Alcohol Use: No  . Drug Use: No  . Sexual Activity: No   Other Topics Concern  . Not on file   Social History Narrative   Widowed 2013. 2 sons. 1 grandchild.       Retired from [employer].       Hobbies: time at home and with family      HCPOA: sister-in-law and brother. Deirdre EvenerStephen  Sumner.       DNR/DNI      Regular exercise: none   Caffeine use: cup of coffee     ROS--See HPI   Objective: BP 122/64  Temp(Src) 98.5 F (36.9 C)  Wt 159 lb (72.122 kg) Gen: NAD, resting comfortably, appears stated age HEENT: Mucous membranes are moist. Oropharynx normal Neck: no thyromegaly, small cyst on right side noted CV: RRR no murmurs rubs or gallops Lungs: CTAB no crackles, wheeze, rhonchi Abdomen: soft/nontender/nondistended/normal bowel sounds. Ext: no edema Skin: warm, dry, no rash Neuro: grossly normal, moves all extremities   Assessment/Plan:  Smoker Strongly advised cessation. Patient is interested. She will call 1 800 quit line for support. She states it will be very helpful for her son to stop smoking and I encouraged patient not to rely on his decision.  RESTLESS LEG SYNDROME Well controlled on neupro. Continue  Essential hypertension Well-controlled on almost olmesartan, amlodipine, hydrochlorothiazide in combination Tribenzor form. Continue current meds  COPD (chronic obstructive pulmonary disease) I suspect her dyspnea on exertion is related to COPD but she is not interested in inhalers. As this is primarily symptomatic with no mortality benefit and she is not particularly worried about her symptoms, did not force the issue. I did advocate for quitting smoking to prevent worsening DOE.  DNR (do not resuscitate) DNR/DNI decision verified 09/29/14. Does have HCPOA if incapacitated (see social history). Signed yellow form.    3-6 month follow-up  Orders Placed This Encounter  Procedures  . Pneumococcal conjugate vaccine 13-valent

## 2014-09-29 NOTE — Telephone Encounter (Signed)
[facility] - [place], [place] - [address] is requesting re-fill on TRIBENZOR 40-10-12.5 MG TABS

## 2014-09-29 NOTE — Assessment & Plan Note (Signed)
I suspect her dyspnea on exertion is related to COPD but she is not interested in inhalers. As this is primarily symptomatic with no mortality benefit and she is not particularly worried about her symptoms, did not force the issue. I did advocate for quitting smoking to prevent worsening DOE.

## 2014-09-29 NOTE — Assessment & Plan Note (Signed)
DNR/DNI decision verified 09/29/14. Does have HCPOA if incapacitated (see social history). Signed yellow form.

## 2014-09-29 NOTE — Patient Instructions (Addendum)
Call 1800quitnow for help with quitting smoking.   Prevnar today (booster pneumonia)   Post yellow for for do not resuscitate at home  Blood pressure looks great!

## 2014-09-29 NOTE — Telephone Encounter (Signed)
rx sent in electronically 

## 2014-09-29 NOTE — Assessment & Plan Note (Signed)
Well controlled on neupro. Continue

## 2014-10-20 DIAGNOSIS — H538 Other visual disturbances: Secondary | ICD-10-CM | POA: Diagnosis not present

## 2014-10-20 DIAGNOSIS — H26493 Other secondary cataract, bilateral: Secondary | ICD-10-CM | POA: Diagnosis not present

## 2014-11-07 ENCOUNTER — Encounter: Payer: Self-pay | Admitting: Podiatry

## 2014-11-07 ENCOUNTER — Ambulatory Visit (INDEPENDENT_AMBULATORY_CARE_PROVIDER_SITE_OTHER): Payer: Medicare Other | Admitting: Podiatry

## 2014-11-07 VITALS — BP 140/66 | HR 71 | Resp 15

## 2014-11-07 DIAGNOSIS — B351 Tinea unguium: Secondary | ICD-10-CM | POA: Diagnosis not present

## 2014-11-07 DIAGNOSIS — M2041 Other hammer toe(s) (acquired), right foot: Secondary | ICD-10-CM

## 2014-11-07 DIAGNOSIS — L84 Corns and callosities: Secondary | ICD-10-CM

## 2014-11-07 DIAGNOSIS — M205X1 Other deformities of toe(s) (acquired), right foot: Secondary | ICD-10-CM

## 2014-11-07 NOTE — Progress Notes (Signed)
   Subjective:    Patient ID: Cheryl Atkinson, female    DOB: Jul 28, 1931, 80 y.o.   MRN: [id number]  HPI Comments: N hammer toe L right 2nd toe distal D since 09/2014 O worsening C contracted toe with hard painful callous A enclosed shoes T Neosporin dressing and bandaid, referral from brother-in-law      Review of Systems  Musculoskeletal: Positive for back pain and gait problem.       Balance problems  All other systems reviewed and are negative.      Objective:   Physical Exam  Orientated 3  Vascular: DP left 0/4 DP right 2/4 PT left 1/4 PT right 0/4  Neurological: Sensation to 10 g monofilament wire intact 4/5 right and 5/5 left Vibratory sensation intact bilaterally Ankle reflex equal and reactive bilaterally  Dermatological: Distal second right toe has keratoses with overhanging deformed second right toenail The toenails in general have texture and color changes 6-10  Musculoskeletal: Contractures distal interphalangeal joint second right toe      Assessment & Plan:   Assessment: Decrease pedal pulses bilaterally Mycotic second right toenail Distal keratoses second right toe Mallet toe second right toe  Plan: The second right toenail was debrided and keratoses debrided. Pinpoint bleeding distal second right toe after debridement of keratoses treated with topical antibiotic ointment and protective bandage.  Patient advised to remove the bandage on the second right toe in 2 days and continue to apply topical antibiotic ointment to second right toe until a scab forms. Wear open toe or soft shoe on right foot until toes comfortable  Return as needed for debridement

## 2014-11-07 NOTE — Patient Instructions (Signed)
Removed bandage on second right toe in 2 days Apply topical antibiotic ointment to the second right toe daily until a scab forms on toe Continue wear open toe surgical shoe on right foot until second right toe is comfortable Return as needed for debridement of thick fungal toenails and corn

## 2014-11-08 ENCOUNTER — Encounter: Payer: Self-pay | Admitting: Podiatry

## 2014-11-10 ENCOUNTER — Telehealth: Payer: Self-pay | Admitting: Family Medicine

## 2014-11-10 NOTE — Telephone Encounter (Signed)
[facility] [place] - [address] [phone] is requesting re-fill on NEUPRO 2 MG/24HR

## 2014-11-11 MED ORDER — ROTIGOTINE 2 MG/24HR TD PT24: 1.0000 | MEDICATED_PATCH | Freq: Every day | TRANSDERMAL | Status: DC

## 2014-11-11 NOTE — Telephone Encounter (Signed)
yes

## 2014-11-11 NOTE — Telephone Encounter (Signed)
Medication refilled

## 2014-11-11 NOTE — Telephone Encounter (Signed)
Is this ok to refill?  

## 2014-12-05 ENCOUNTER — Telehealth: Payer: Self-pay | Admitting: Family Medicine

## 2014-12-05 MED ORDER — OLMESARTAN-AMLODIPINE-HCTZ 40-10-12.5 MG PO TABS: ORAL_TABLET | ORAL | Status: DC

## 2014-12-05 MED ORDER — ROTIGOTINE 2 MG/24HR TD PT24: 1.0000 | MEDICATED_PATCH | Freq: Every day | TRANSDERMAL | Status: DC

## 2014-12-05 NOTE — Telephone Encounter (Signed)
Medications sent into [facility]

## 2014-12-05 NOTE — Telephone Encounter (Signed)
Pt need new refills on Tribenzor Tabs OTY 90/Neupro patch strength /24h from [employer] [facility]

## 2014-12-30 ENCOUNTER — Encounter: Payer: Self-pay | Admitting: Family Medicine

## 2014-12-30 ENCOUNTER — Ambulatory Visit (INDEPENDENT_AMBULATORY_CARE_PROVIDER_SITE_OTHER): Payer: Medicare Other | Admitting: Family Medicine

## 2014-12-30 VITALS — BP 120/60 | Temp 98.0°F | Wt 162.0 lb

## 2014-12-30 DIAGNOSIS — I493 Ventricular premature depolarization: Secondary | ICD-10-CM | POA: Diagnosis not present

## 2014-12-30 DIAGNOSIS — F17200 Nicotine dependence, unspecified, uncomplicated: Secondary | ICD-10-CM

## 2014-12-30 DIAGNOSIS — H9313 Tinnitus, bilateral: Secondary | ICD-10-CM

## 2014-12-30 DIAGNOSIS — Z720 Tobacco use: Secondary | ICD-10-CM | POA: Diagnosis not present

## 2014-12-30 NOTE — Progress Notes (Signed)
Tana Conch, MD Phone: [phone]  Subjective:   Cheryl Atkinson is a 81 y.o. year old very pleasant female patient who presents with the following:  Tinnitus-new Bilateral. Wakes her up at night. About a month, staying same since onset. No medications or therapies tried. No associated symptoms. Never had anything like this before. Seems to be at all times and nothing makes it better or worse.  ROS- no dizziness,  No hearing loss. No vertigo.  Past Medical History- Patient Active Problem List   Diagnosis Date Noted  . Smoker 09/29/2014    Priority: High  . DNR (do not resuscitate) 09/29/2014    Priority: High  . Hyperglycemia 07/19/2014    Priority: Medium  . COPD (chronic obstructive pulmonary disease) 09/20/2009    Priority: Medium  . Iron deficiency anemia 11/09/2007    Priority: Medium  . RESTLESS LEG SYNDROME 09/25/2007    Priority: Medium  . Essential hypertension 09/25/2007    Priority: Medium  . LOW BACK PAIN 09/25/2007    Priority: Medium  . Osteoporosis 09/25/2007    Priority: Medium  . Multinodular goiter 08/30/2013    Priority: Low  . Spinal stenosis of lumbar region at multiple levels 09/29/2012    Priority: Low  . HIP PAIN, BILATERAL 07/16/2010    Priority: Low  . CONSTIPATION, CHRONIC 09/20/2009    Priority: Low  . History of UTI 11/09/2007    Priority: Low  . GERD 09/25/2007    Priority: Low  . Tinnitus 12/31/2014   Medications- reviewed and updated Current Outpatient Prescriptions  Medication Sig Dispense Refill  . esomeprazole (NEXIUM) 40 MG capsule Take 1 capsule (40 mg total) by mouth daily before breakfast. 30 capsule 11  . Olmesartan-Amlodipine-HCTZ (TRIBENZOR) 40-10-12.5 MG TABS TAKE 1 TABLET DAILY 90 tablet 1  . rotigotine (NEUPRO) 2 MG/24HR Place 1 patch onto the skin daily. 90 patch 0  . Polyethylene Glycol 3350 (MIRALAX PO) Take 17 g by mouth at bedtime.     . traMADol (ULTRAM) 50 MG tablet Take 50 mg by mouth at bedtime as  needed. For pain     No current facility-administered medications for this visit.    Objective: BP 120/60 mmHg  Temp(Src) 98 F (36.7 C)  Wt 162 lb (73.483 kg) Gen: NAD, resting comfortably in chair, walks with 3 pronged cane without difficulty TM normal, slight wax but does not obscure TM, oropharynx normal, turbinates not congested/normal CV: RRR no murmurs rubs or gallops Lungs: CTAB no crackles, wheeze, rhonchi. Prolonged expiratory phase.  Ext: no edema, right foot in post op shoe (under care podiatry) Skin: warm, dry, no rash Neuro:  Neuro: CN II-XII intact, sensation and reflexes normal throughout, 5/5 muscle strength in bilateral upper and lower extremities. Normal finger to nose. Normal rapid alternating movements.   Assessment/Plan:  Tinnitus No reported hearing loss or vertigo. Could be related to gradually progressive hearing loss not noted by patient. Refer to ENT for hearing test and further evaluation.    Smoker Continued to encourage cessation. Smoking 1/2 PPD. Hypnosis worked before so advised of [employer]    Return precautions advised. 6 month follow up planned and update labs at that time.   Orders Placed This Encounter  Procedures  . Ambulatory referral to ENT    Referral Priority:  Routine    Referral Type:  Consultation    Referral Reason:  Specialty Services Required    Requested Specialty:  Otolaryngology    Number of Visits Requested:  1  EKG from another patient was erroneously entered by [facility] Hines, CMA in this chart. Safety zone portal has been filed and forwarded to appropriate pathways to help in removing EKG.

## 2014-12-30 NOTE — Patient Instructions (Signed)
Try the [facility] for quitting smoking through hypnosis  For the ringing in your ears, refer you to ENT doctors. Call us if have not heard about this appointment within a week. Let us know if new or worsening symptoms.   Blood pressure looks great.   6 month follow up with repeat labs at that time.

## 2014-12-31 DIAGNOSIS — H9319 Tinnitus, unspecified ear: Secondary | ICD-10-CM | POA: Insufficient documentation

## 2014-12-31 NOTE — Assessment & Plan Note (Signed)
No reported hearing loss or vertigo. Could be related to gradually progressive hearing loss not noted by patient. Refer to ENT for hearing test and further evaluation.

## 2014-12-31 NOTE — Assessment & Plan Note (Signed)
Continued to encourage cessation. Smoking 1/2 PPD. Hypnosis worked before so [facility] of [facility]

## 2015-01-02 DIAGNOSIS — H6123 Impacted cerumen, bilateral: Secondary | ICD-10-CM | POA: Diagnosis not present

## 2015-01-02 DIAGNOSIS — H9313 Tinnitus, bilateral: Secondary | ICD-10-CM | POA: Diagnosis not present

## 2015-01-09 ENCOUNTER — Encounter: Payer: Self-pay | Admitting: Family Medicine

## 2015-01-16 ENCOUNTER — Ambulatory Visit: Payer: Medicare Other | Admitting: Podiatry

## 2015-01-18 ENCOUNTER — Encounter: Payer: Self-pay | Admitting: Podiatry

## 2015-01-18 ENCOUNTER — Ambulatory Visit (INDEPENDENT_AMBULATORY_CARE_PROVIDER_SITE_OTHER): Payer: Medicare Other | Admitting: Podiatry

## 2015-01-18 DIAGNOSIS — L84 Corns and callosities: Secondary | ICD-10-CM

## 2015-01-18 DIAGNOSIS — B351 Tinea unguium: Secondary | ICD-10-CM

## 2015-01-19 NOTE — Progress Notes (Signed)
Patient ID: Cheryl ReeseBetty L Atkinson, female   DOB: 1931-06-14, 81 y.o.   MRN: [id number]  Subjective: This patient presents again complaining of a painful distal keratoses second right toe required to wear a open toe shoe or surgical shoe. She is also complaining of uncomfortable toenails on right and left feet with maximum discomfort in the second right toenail area  Objective: Orientated 3 The distal second right toe has large nucleated keratoses with an overhanging deformity second right toenail. The second right toe there is relatively long and there is a low-grade erythema on the dorsal posterior nail fold area with mild contracture at the distal interphalangeal joint of the second right toe The toenails 1 -5 bilaterally have texture and color changes with incurvation and general tenderness to palpation  Assessment: Chronic traumatic inflammation distal second right toes associated with relatively long second right toe with distal keratoses and traumatic/mycotic nail changes The remaining toenails are mycotic with symptoms  Plan: The distal second right keratoses was debrided back All the toenails 6-10 were debrided without a bleeding Patient advised that she has a chronic condition associated with repetitive microtrauma in the second right toe I recommended that she continue to wear soft shoe or a surgical shoe on the right foot I dispensed a gelcap toe cover to wear in the second right toe Also, made patient aware that if she had symptoms that could not resolve with conservative care including repetitive debridement shoe modification that surgical treatment may be an option pending evaluation of vascular status and general health.  Reappoint 2 months

## 2015-02-13 ENCOUNTER — Other Ambulatory Visit: Payer: Self-pay | Admitting: Family Medicine

## 2015-03-08 ENCOUNTER — Emergency Department (INDEPENDENT_AMBULATORY_CARE_PROVIDER_SITE_OTHER)
Admission: EM | Admit: 2015-03-08 | Discharge: 2015-03-08 | Disposition: A | Discharge status: Home / Self Care | Payer: Medicare Other | Source: Home / Self Care | Attending: Family Medicine | Admitting: Family Medicine

## 2015-03-08 ENCOUNTER — Encounter (HOSPITAL_COMMUNITY): Payer: Self-pay | Admitting: Family Medicine

## 2015-03-08 DIAGNOSIS — R51 Headache: Secondary | ICD-10-CM | POA: Diagnosis not present

## 2015-03-08 DIAGNOSIS — R519 Headache, unspecified: Secondary | ICD-10-CM

## 2015-03-08 DIAGNOSIS — N3090 Cystitis, unspecified without hematuria: Secondary | ICD-10-CM

## 2015-03-08 DIAGNOSIS — I10 Essential (primary) hypertension: Secondary | ICD-10-CM

## 2015-03-08 LAB — POCT URINALYSIS DIP (DEVICE)
Bilirubin Urine: NEGATIVE
Bilirubin Urine: NEGATIVE
Glucose, UA: NEGATIVE mg/dL
Glucose, UA: NEGATIVE mg/dL
Hgb urine dipstick: NEGATIVE
Hgb urine dipstick: NEGATIVE
Ketones, ur: NEGATIVE mg/dL
Ketones, ur: NEGATIVE mg/dL
Leukocytes, UA: NEGATIVE
Leukocytes, UA: NEGATIVE
Nitrite: NEGATIVE
Nitrite: NEGATIVE
Protein, ur: NEGATIVE mg/dL
Protein, ur: NEGATIVE mg/dL
Specific Gravity, Urine: 1.02 (ref 1.005–1.030)
Specific Gravity, Urine: 1.02 (ref 1.005–1.030)
Urobilinogen, UA: 1 mg/dL (ref 0.0–1.0)
Urobilinogen, UA: 1 mg/dL (ref 0.0–1.0)
pH: 7 (ref 5.0–8.0)
pH: 6.5 (ref 5.0–8.0)

## 2015-03-08 LAB — URINALYSIS, ROUTINE W REFLEX MICROSCOPIC
Bilirubin Urine: NEGATIVE
Glucose, UA: NEGATIVE mg/dL
Hgb urine dipstick: NEGATIVE
Ketones, ur: NEGATIVE mg/dL
Leukocytes, UA: NEGATIVE
Nitrite: NEGATIVE
Protein, ur: NEGATIVE mg/dL
Specific Gravity, Urine: 1.017 (ref 1.005–1.030)
Urobilinogen, UA: 1 mg/dL (ref 0.0–1.0)
pH: 6.5 (ref 5.0–8.0)

## 2015-03-08 MED ORDER — OXYBUTYNIN CHLORIDE 5 MG PO TABS: 5.0000 mg | ORAL_TABLET | Freq: Three times a day (TID) | ORAL | Status: DC | PRN

## 2015-03-08 MED ORDER — PHENAZOPYRIDINE HCL 100 MG PO TABS: 100.0000 mg | ORAL_TABLET | Freq: Three times a day (TID) | ORAL | Status: DC | PRN

## 2015-03-08 NOTE — ED Notes (Signed)
"  I think I have another urinary tract infection". NAD minimal relief w Azo

## 2015-03-08 NOTE — Discharge Instructions (Signed)
Your symptoms are likely due to a condition called interstitial cystitis. This is best treated with Pyridium for pain and oxybutynin for bladder spasm. Her urine will be sent for culture and we will call you if it shows signs of infection. Your urine sample was run twice today in our lab and both times showed completely normal results. Please go to the emergency room if her symptoms become significantly worse. Please take

## 2015-03-08 NOTE — ED Provider Notes (Signed)
CSN: [id number]     Arrival date & time 03/08/15  [id number] History   First MD Initiated Contact with Patient 03/08/15 [phone]     Chief Complaint  Patient presents with  . Urinary Tract Infection   (Consider location/radiation/quality/duration/timing/severity/associated sxs/prior Treatment) HPI   7 days ago developed HA. 3 days ago developed lower abd pain w/ radiation to legs. Associated w/ dysuria and frequency. Getting worse. Tried Azo tablets w/o benefit. Symptoms are constant. Reports daily soft BMs.  HA is across the front and very dull and mild. HA is not getting worse. Pt reports getting HAs w/ illnesses.    Past Medical History  Diagnosis Date  . Thyroid disease   . Restless leg syndrome   . Osteoporosis   . Constipation   . Anemia   . Hypertension   . Cellulitis of right lower extremity 11/27/2013   Past Surgical History  Procedure Laterality Date  . Appendectomy    . Abdominal hysterectomy    . Tonsillectomy    . Eye surgery    . Cataract extraction     Family History  Problem Relation Age of Onset  . Coronary artery disease    . Heart disease Father    History  Substance Use Topics  . Smoking status: Current Every Day Smoker -- 0.50 packs/day    Types: Cigarettes  . Smokeless tobacco: Never Used  . Alcohol Use: No   OB History    No data available     Review of Systems Per HPI with all other pertinent systems negative.   Allergies  Codeine  Home Medications   Prior to Admission medications   Medication Sig Start Date End Date Taking? Authorizing Provider  NEUPRO 2 MG/24HR APPLY 1 PATCH ONTO THE SKIN DAILY 02/13/15   Shelva MajesticStephen O Hunter, MD  Olmesartan-Amlodipine-HCTZ [facility](TRIBENZOR) 40-10-12.5 MG TABS TAKE 1 TABLET DAILY 12/05/14   Shelva MajesticStephen O Hunter, MD  oxybutynin (DITROPAN) 5 MG tablet Take 1 tablet (5 mg total) by mouth 3 (three) times daily as needed for bladder spasms. 03/08/15   Ozella Rocksavid J Merrell, MD  phenazopyridine (PYRIDIUM) 100 MG tablet Take 1-2 tablets  (100-200 mg total) by mouth 3 (three) times daily as needed for pain. 03/08/15   Ozella Rocksavid J Merrell, MD  traMADol (ULTRAM) 50 MG tablet Take 50 mg by mouth at bedtime as needed. For pain    Historical Provider, MD   BP 131/52 mmHg  Pulse 65  Temp(Src) 98.4 F (36.9 C) (Oral)  Resp 20  SpO2 100% Physical Exam Physical Exam  Constitutional: oriented to person, place, and time. appears well-developed and well-nourished. No distress.  HENT:  Head: Normocephalic and atraumatic.  Eyes: EOMI. PERRL.  Neck: Normal range of motion.  Cardiovascular: RRR, no m/r/g, 2+ distal pulses,  Pulmonary/Chest: Effort normal and breath sounds normal. No respiratory distress.  Abdominal: Soft. Bowel sounds are normal. NonTTP, no distension.  Musculoskeletal: Normal range of motion. Non ttp, no effusion.  Neurological: alert and oriented to person, place, and time.  Skin: Skin is warm. No rash noted. non diaphoretic.  Psychiatric: normal mood and affect. behavior is normal. Judgment and thought content normal.   ED Course  Procedures (including critical care time) Labs Review Labs Reviewed  URINALYSIS, ROUTINE W REFLEX MICROSCOPIC - Abnormal; Notable for the following:    Color, Urine AMBER (*)    All other components within normal limits  URINE CULTURE  POCT URINALYSIS DIP (DEVICE)  POCT URINALYSIS DIP (DEVICE)    Imaging Review  No results found.   MDM   1. Cystitis   2. Headache in front of head   3. Essential hypertension     HTN: normotensive. Continue medications  Abd pain: likely interstitial cystitis. UA negative - start pyridium and Oxybutinin - UCX sent  HA: Tylenol.   Precautions given and all questions answered       Ozella Rocks, MD 03/12/15 [id number]

## 2015-03-09 LAB — URINE CULTURE
Colony Count: NO GROWTH
Culture: NO GROWTH

## 2015-03-15 DIAGNOSIS — M47817 Spondylosis without myelopathy or radiculopathy, lumbosacral region: Secondary | ICD-10-CM | POA: Diagnosis not present

## 2015-03-15 DIAGNOSIS — M545 Low back pain: Secondary | ICD-10-CM | POA: Diagnosis not present

## 2015-03-20 ENCOUNTER — Encounter: Payer: Self-pay | Admitting: Podiatry

## 2015-03-20 ENCOUNTER — Ambulatory Visit (INDEPENDENT_AMBULATORY_CARE_PROVIDER_SITE_OTHER): Payer: Medicare Other | Admitting: Podiatry

## 2015-03-20 DIAGNOSIS — M205X1 Other deformities of toe(s) (acquired), right foot: Secondary | ICD-10-CM

## 2015-03-20 DIAGNOSIS — L84 Corns and callosities: Secondary | ICD-10-CM | POA: Diagnosis not present

## 2015-03-20 DIAGNOSIS — M2041 Other hammer toe(s) (acquired), right foot: Secondary | ICD-10-CM

## 2015-03-21 NOTE — Progress Notes (Signed)
Patient ID: Cheryl Atkinson, female   DOB: November 14, 1931, 81 y.o.   MRN: [id number]  Subjective: This patient presents complaining of a painful keratoses distal aspect second right toe required to wear a surgical shoe. She complains of extreme discomfort with only minimal relief after debridement of the distal keratoses and second right toenail  Objective: Mallet toe deformity second right Well-organized distal keratoses second right toe Hypertrophic toenails second right toe  Assessment: Painful distal keratoses second right toe associated with mallet toe and relatively long second right toe  Plan: I debrided the keratoses on the second right toe and apply protective pad Patient wanted to have other treatment options for this problem associated tolerate close shoes. I referring her to Cheryl Atkinson for further evaluation and possible surgical treatment if indicated  Reappoint for follow-up visit with Cheryl Atkinson

## 2015-03-27 ENCOUNTER — Encounter: Payer: Self-pay | Admitting: Podiatry

## 2015-03-27 ENCOUNTER — Ambulatory Visit (INDEPENDENT_AMBULATORY_CARE_PROVIDER_SITE_OTHER): Payer: Medicare Other | Admitting: Podiatry

## 2015-03-27 ENCOUNTER — Ambulatory Visit (INDEPENDENT_AMBULATORY_CARE_PROVIDER_SITE_OTHER): Payer: Medicare Other

## 2015-03-27 DIAGNOSIS — R52 Pain, unspecified: Secondary | ICD-10-CM | POA: Diagnosis not present

## 2015-03-27 DIAGNOSIS — R0989 Other specified symptoms and signs involving the circulatory and respiratory systems: Secondary | ICD-10-CM | POA: Diagnosis not present

## 2015-03-27 DIAGNOSIS — M205X1 Other deformities of toe(s) (acquired), right foot: Secondary | ICD-10-CM

## 2015-03-27 DIAGNOSIS — M2041 Other hammer toe(s) (acquired), right foot: Secondary | ICD-10-CM

## 2015-03-27 NOTE — Progress Notes (Signed)
Patient ID: Cheryl Atkinson, female   DOB: Feb 04, 1931, 81 y.o.   MRN: [id number]  Subjective: Cheryl Atkinson presents the office today for surgical consultation a right second digit mallet toe deformity. She states the toe hurts and throbs. She develops a painful callus over the end of her second toe. Trimming seems to help alleviate her symptoms for the short term before it starts to reoccur. She has tried pads without resolution. At this time she is requesting surgical intervention to help decrease the pain and deformity. Denies any systemic complaints such as fevers, chills, nausea, vomiting. No acute changes since last appointment, and no other complaints at this time.   Objective: AAO x3, NAD DP/PT pulses palpable bilaterally although somewhat decreased, CRT less than 3 seconds Protective sensation intact with Simms Weinstein monofilament, vibratory sensation intact, Achilles tendon reflex intact There is flexion contracture present at the DIPJ of the right second digit with distal hyperkeratotic lesion. There is tenderness palpation along the distal aspect of the digit. No other areas of pinpoint bony tenderness or pain with vibratory sensation. MMT 5/5, ROM WNL. No edema, erythema, increase in warmth to bilateral lower extremities.  No open lesions or pre-ulcerative lesions.  No pain with calf compression, swelling, warmth, erythema  Assessment: Right second digit mallet toe deformity.  Plan: -All treatment options discussed with the patient including all alternatives, risks, complications.  -I discussed surgical conservative options. At this time as she is attended conservative treatment she is requesting surgical intervention. -Proposed surgery is right second digit DIPJ arthroplasty.  -The incision placement as well as the postoperative course was discussed with the patient. I discussed risks of the surgery which include, but not limited to, infection, bleeding, pain, swelling, need for  further surgery, delayed or nonhealing, painful or ugly scar, numbness or sensation changes, over/under correction, recurrence, transfer lesions, further deformity, hardware failure, DVT/PE, loss of toe/foot. Patient understands these risks and wishes to proceed with surgery. The surgical consent was reviewed with the patient all 3 pages were signed. No promises or guarantees were given to the outcome of the procedure. All questions were answered to the best of my ability. Before the surgery the patient was encouraged to call the office if there is any further questions. [facility]. -Prior to surgery will obtain noninvasive vascular studies given slightly decreased pulses and history of smoking. -Patient encouraged to call the office with any questions, concerns, change in symptoms.

## 2015-03-27 NOTE — Patient Instructions (Signed)
Pre-Operative Instructions  Congratulations, you have decided to take an important step to improving your quality of life.  You can be assured that the doctors of [facility] will be with you every step of the way.  1. Plan to be at the surgery center/hospital at least 1 (one) hour prior to your scheduled time unless otherwise directed by the surgical center/hospital staff.  You must have a responsible adult accompany you, remain during the surgery and drive you home.  Make sure you have directions to the surgical center/hospital and know how to get there on time. 2. For hospital based surgery you will need to obtain a history and physical form from your family physician within 1 month prior to the date of surgery- we will give you a form for you primary physician.  3. We make every effort to accommodate the date you request for surgery.  There are however, times where surgery dates or times have to be moved.  We will contact you as soon as possible if a change in schedule is required.   4. No Aspirin/Ibuprofen for one week before surgery.  If you are on aspirin, any non-steroidal anti-inflammatory medications (Mobic, Aleve, Ibuprofen) you should stop taking it 7 days prior to your surgery.  You make take Tylenol  For pain prior to surgery.  5. Medications- If you are taking daily heart and blood pressure medications, seizure, reflux, allergy, asthma, anxiety, pain or diabetes medications, make sure the surgery center/hospital is aware before the day of surgery so they may notify you which medications to take or avoid the day of surgery. 6. No food or drink after midnight the night before surgery unless directed otherwise by surgical center/hospital staff. 7. No alcoholic beverages 24 hours prior to surgery.  No smoking 24 hours prior to or 24 hours after surgery. 8. Wear loose pants or shorts- loose enough to fit over bandages, boots, and casts. 9. No slip on shoes, sneakers are best. 10. Bring  your boot with you to the surgery center/hospital.  Also bring crutches or a walker if your physician has prescribed it for you.  If you do not have this equipment, it will be provided for you after surgery. 11. If you have not been contracted by the surgery center/hospital by the day before your surgery, call to confirm the date and time of your surgery. 12. Leave-time from work may vary depending on the type of surgery you have.  Appropriate arrangements should be made prior to surgery with your employer. 13. Prescriptions will be provided immediately following surgery by your doctor.  Have these filled as soon as possible after surgery and take the medication as directed. 14. Remove nail polish on the operative foot. 15. Wash the night before surgery.  The night before surgery wash the foot and leg well with the antibacterial soap provided and water paying special attention to beneath the toenails and in between the toes.  Rinse thoroughly with water and dry well with a towel.  Perform this wash unless told not to do so by your physician.  Enclosed: 1 Ice pack (please put in freezer the night before surgery)   1 Hibiclens skin cleaner   Pre-op Instructions  If you have any questions regarding the instructions, do not hesitate to call our office.  [place]: [address] [place], [place] [address] [phone]  [place]: [address] [place], [place] [address] [phone]  [place]: [address]  [place], [place] [address] [phone]  Dr. Richard   Tuchman DPM, Dr. Norman Regal DPM Dr. Richard Sikora DPM, Dr. M. Todd Hyatt DPM, Dr. Kathryn Egerton DPM 

## 2015-03-28 NOTE — Addendum Note (Signed)
Addended by: Hadley PenOX, Berton Butrick R on: 03/28/2015 09:41 AM   Modules accepted: Orders

## 2015-03-30 ENCOUNTER — Other Ambulatory Visit: Payer: Self-pay | Admitting: Podiatry

## 2015-03-30 ENCOUNTER — Telehealth (HOSPITAL_COMMUNITY): Payer: Self-pay | Admitting: *Deleted

## 2015-03-30 DIAGNOSIS — R0989 Other specified symptoms and signs involving the circulatory and respiratory systems: Secondary | ICD-10-CM

## 2015-04-07 ENCOUNTER — Ambulatory Visit (HOSPITAL_COMMUNITY)
Admission: RE | Admit: 2015-04-07 | Discharge: 2015-04-07 | Disposition: A | Discharge status: Home / Self Care | Payer: Medicare Other | Source: Ambulatory Visit | Attending: Cardiology | Admitting: Cardiology

## 2015-04-07 ENCOUNTER — Telehealth: Payer: Self-pay | Admitting: *Deleted

## 2015-04-07 DIAGNOSIS — M79661 Pain in right lower leg: Secondary | ICD-10-CM | POA: Insufficient documentation

## 2015-04-07 DIAGNOSIS — M79662 Pain in left lower leg: Secondary | ICD-10-CM | POA: Diagnosis not present

## 2015-04-07 DIAGNOSIS — R0989 Other specified symptoms and signs involving the circulatory and respiratory systems: Secondary | ICD-10-CM

## 2015-04-07 DIAGNOSIS — I739 Peripheral vascular disease, unspecified: Secondary | ICD-10-CM | POA: Diagnosis not present

## 2015-04-07 NOTE — Telephone Encounter (Signed)
I called and informed patient that Dr. Ardelle AntonWagoner wants to cancel your surgery for Monday.  He said your doppler study came back abnormal, you have some claudication in your arteries.  He wants to refer you to Dr. Nanetta BattyJonathan Berry for follow-up.  "Okay, does that mean I don't have adequate blood flow?"  Yes, he doesn't want to do surgery because you may not heal properly.  Dr. Allyson SabalBerry will be able to tell you what needs to be done to treat the problem.  They will call you to get the appointment scheduled.  "Where is he located?"  He is located at the same place you just left.  I'll call surgical center and cancel surgery.  "Okay, thank you."  [facility] and canceled surgery.    I faxed referral to Dr. Nanetta BattyJonathan Berry for a consultation.

## 2015-04-11 ENCOUNTER — Telehealth: Payer: Self-pay | Admitting: Cardiovascular Disease

## 2015-04-11 NOTE — Telephone Encounter (Signed)
Closed encounter °

## 2015-04-14 ENCOUNTER — Encounter: Payer: Medicare Other | Admitting: Podiatry

## 2015-04-25 ENCOUNTER — Encounter: Payer: Medicare Other | Admitting: Cardiovascular Disease

## 2015-04-26 ENCOUNTER — Encounter: Payer: Self-pay | Admitting: Cardiovascular Disease

## 2015-04-26 ENCOUNTER — Ambulatory Visit (INDEPENDENT_AMBULATORY_CARE_PROVIDER_SITE_OTHER): Payer: Medicare Other | Admitting: Cardiovascular Disease

## 2015-04-26 VITALS — BP 120/60 | HR 68 | Ht 66.0 in | Wt 164.2 lb

## 2015-04-26 DIAGNOSIS — Z720 Tobacco use: Secondary | ICD-10-CM

## 2015-04-26 DIAGNOSIS — I70219 Atherosclerosis of native arteries of extremities with intermittent claudication, unspecified extremity: Secondary | ICD-10-CM | POA: Insufficient documentation

## 2015-04-26 DIAGNOSIS — I739 Peripheral vascular disease, unspecified: Secondary | ICD-10-CM

## 2015-04-26 DIAGNOSIS — R5383 Other fatigue: Secondary | ICD-10-CM | POA: Diagnosis not present

## 2015-04-26 DIAGNOSIS — D689 Coagulation defect, unspecified: Secondary | ICD-10-CM

## 2015-04-26 DIAGNOSIS — Z79899 Other long term (current) drug therapy: Secondary | ICD-10-CM

## 2015-04-26 NOTE — Assessment & Plan Note (Signed)
Patient was referred by Dr. Ouida SillsMatt Wagoner, podiatrist, for evaluation and treatment of peripheral arterial disease. She was scheduled to have a procedure performed on her right foot but no pulses were felt. She does complain of bilateral lifestyle limiting lower extremity claudication. Dopplers performed in our office 04/07/15 revealed ABIs in the 0.6 range bilaterally with high-grade proximal right common iliac artery stenosis and an occluded mid left SFA . I'm going to arrange for her to undergo angiography and potential percutaneous intervention.

## 2015-04-26 NOTE — Progress Notes (Signed)
   04/26/2015 Cheryl Atkinson   09/13/1931  [id number]  Primary Physician HUNTER, STEPHEN, MD Primary Cardiologist: Jonathan J. Berry MD FACP,FACC,FAHA, FSCAI   HPI:  Cheryl Atkinson is an 81-year-old mildly overweight widowed Caucasian female mother of 2 children, grandmother and one grandchildren referred by Dr. Matt Wagner, podiatrist, for evaluation treatment of peripheral arterial disease. Her primary care physician is Dr. Steven Hunter. She is a retired machine operator. She smoked 30 pack years and quit one month ago. Her only other risk factor is hypertension and family history. Her father died of a myocardial infarction in his early 60s. She has never had a heart attack or stroke. She denies chest pain or shortness of breath. She does have bilateral lifestyle limiting claudication.   Current Outpatient Prescriptions  Medication Sig Dispense Refill  . NEUPRO 2 MG/24HR APPLY 1 PATCH ONTO THE SKIN DAILY 90 patch 1  . Olmesartan-Amlodipine-HCTZ (TRIBENZOR) 40-10-12.5 MG TABS TAKE 1 TABLET DAILY 90 tablet 1  . oxybutynin (DITROPAN) 5 MG tablet Take 1 tablet (5 mg total) by mouth 3 (three) times daily as needed for bladder spasms. 30 tablet 0  . phenazopyridine (PYRIDIUM) 100 MG tablet Take 1-2 tablets (100-200 mg total) by mouth 3 (three) times daily as needed for pain. 30 tablet 0  . traMADol (ULTRAM-ER) 300 MG 24 hr tablet Take 300 mg by mouth daily.     No current facility-administered medications for this visit.    Allergies  Allergen Reactions  . Codeine Other (See Comments)    REACTION: Syncope     History   Social History  . Marital Status: Widowed    Spouse Name: N/A  . Number of Children: N/A  . Years of Education: N/A   Occupational History  . Not on file.   Social History Main Topics  . Smoking status: Current Every Day Smoker -- 0.50 packs/day    Types: Cigarettes  . Smokeless tobacco: Never Used  . Alcohol Use: No  . Drug Use: No  . Sexual Activity: No    Other Topics Concern  . Not on file   Social History Narrative   Widowed 2013. 2 sons. 1 grandchild.       Retired from [employer].       Hobbies: time at home and with family      HCPOA: sister-in-law and brother. Stephen Sumner.       DNR/DNI      Regular exercise: none   Caffeine use: cup of coffee      Review of Systems: General: negative for chills, fever, night sweats or weight changes.  Cardiovascular: negative for chest pain, dyspnea on exertion, edema, orthopnea, palpitations, paroxysmal nocturnal dyspnea or shortness of breath Dermatological: negative for rash Respiratory: negative for cough or wheezing Urologic: negative for hematuria Abdominal: negative for nausea, vomiting, diarrhea, bright red blood per rectum, melena, or hematemesis Neurologic: negative for visual changes, syncope, or dizziness All other systems reviewed and are otherwise negative except as noted above.    Blood pressure 120/60, pulse 68, height 5' 6" (1.676 m), weight 164 lb 3.2 oz (74.481 kg).  General appearance: alert and no distress Neck: no adenopathy, no carotid bruit, no JVD, supple, symmetrical, trachea midline and thyroid not enlarged, symmetric, no tenderness/mass/nodules Lungs: clear to auscultation bilaterally Heart: regular rate and rhythm, S1, S2 normal, no murmur, click, rub or gallop Extremities: extremities normal, atraumatic, no cyanosis or edema  EKG not performed today  ASSESSMENT AND PLAN:   Peripheral   arterial disease Patient was referred by Dr. Matt Wagoner, podiatrist, for evaluation and treatment of peripheral arterial disease. She was scheduled to have a procedure performed on her right foot but no pulses were felt. She does complain of bilateral lifestyle limiting lower extremity claudication. Dopplers performed in our office 04/07/15 revealed ABIs in the 0.6 range bilaterally with high-grade proximal right common iliac artery stenosis and an occluded mid left  SFA . I'm going to arrange for her to undergo angiography and potential percutaneous intervention.       Jonathan J. Berry MD FACP,FACC,FAHA, FSCAI 04/26/2015 1:57 PM  

## 2015-04-26 NOTE — Patient Instructions (Signed)
Dr. Berry has ordered a peripheral angiogram to be done at  Hospital.  This procedure is going to look at the bloodflow in your lower extremities.  If Dr. Berry is able to open up the arteries, you will have to spend one night in the hospital.  If he is not able to open the arteries, you will be able to go home that same day.    After the procedure, you will not be allowed to drive for 3 days or push, pull, or lift anything greater than 10 lbs for one week.    You will be required to have the following tests prior to the procedure:  1. Blood work-the blood work can be done no more than 7 days prior to the procedure.  It can be done at any [facility] lab.  There is one downstairs on the first floor of this building and one in the [facility] ([address])  2. Chest Xray-the chest xray order has already been placed at the [facility].     *REPS Scott    

## 2015-04-28 ENCOUNTER — Ambulatory Visit
Admission: RE | Admit: 2015-04-28 | Discharge: 2015-04-28 | Disposition: A | Discharge status: Home / Self Care | Payer: Medicare Other | Source: Ambulatory Visit | Attending: Cardiovascular Disease | Admitting: Cardiovascular Disease

## 2015-04-28 DIAGNOSIS — Z720 Tobacco use: Secondary | ICD-10-CM

## 2015-04-28 DIAGNOSIS — Z01818 Encounter for other preprocedural examination: Secondary | ICD-10-CM | POA: Diagnosis not present

## 2015-04-28 DIAGNOSIS — J449 Chronic obstructive pulmonary disease, unspecified: Secondary | ICD-10-CM | POA: Diagnosis not present

## 2015-04-29 LAB — BASIC METABOLIC PANEL
BUN: 18 mg/dL (ref 6–23)
CO2: 23 mEq/L (ref 19–32)
Calcium: 8.9 mg/dL (ref 8.4–10.5)
Chloride: 103 mEq/L (ref 96–112)
Creat: 0.74 mg/dL (ref 0.50–1.10)
Glucose, Bld: 170 mg/dL — ABNORMAL HIGH (ref 70–99)
Potassium: 4 mEq/L (ref 3.5–5.3)
Sodium: 139 mEq/L (ref 135–145)

## 2015-04-29 LAB — TSH: TSH: 1.876 u[IU]/mL (ref 0.350–4.500)

## 2015-04-29 LAB — APTT: aPTT: 32 seconds (ref 24–37)

## 2015-04-29 LAB — PROTIME-INR
INR: 1.08 (ref <1.50)
Prothrombin Time: 14 seconds (ref 11.6–15.2)

## 2015-04-29 LAB — CBC
HCT: 35.7 % — ABNORMAL LOW (ref 36.0–46.0)
Hemoglobin: 11.6 g/dL — ABNORMAL LOW (ref 12.0–15.0)
MCH: 28.4 pg (ref 26.0–34.0)
MCHC: 32.5 g/dL (ref 30.0–36.0)
MCV: 87.3 fL (ref 78.0–100.0)
MPV: 10.5 fL (ref 8.6–12.4)
Platelets: 369 10*3/uL (ref 150–400)
RBC: 4.09 MIL/uL (ref 3.87–5.11)
RDW: 14 % (ref 11.5–15.5)
WBC: 8.1 10*3/uL (ref 4.0–10.5)

## 2015-04-30 ENCOUNTER — Encounter: Payer: Self-pay | Admitting: *Deleted

## 2015-05-04 ENCOUNTER — Encounter (HOSPITAL_COMMUNITY)
Admission: RE | Disposition: A | Discharge status: Home / Self Care | Payer: Medicare Other | Source: Ambulatory Visit | Attending: Cardiovascular Disease

## 2015-05-04 ENCOUNTER — Ambulatory Visit (HOSPITAL_COMMUNITY)
Admission: RE | Admit: 2015-05-04 | Discharge: 2015-05-05 | Disposition: A | Discharge status: Home / Self Care | Payer: Medicare Other | Source: Ambulatory Visit | Attending: Cardiovascular Disease | Admitting: Cardiovascular Disease

## 2015-05-04 ENCOUNTER — Encounter (HOSPITAL_COMMUNITY): Payer: Self-pay | Admitting: Cardiovascular Disease

## 2015-05-04 DIAGNOSIS — Z720 Tobacco use: Secondary | ICD-10-CM

## 2015-05-04 DIAGNOSIS — F17210 Nicotine dependence, cigarettes, uncomplicated: Secondary | ICD-10-CM | POA: Diagnosis not present

## 2015-05-04 DIAGNOSIS — I10 Essential (primary) hypertension: Secondary | ICD-10-CM | POA: Diagnosis not present

## 2015-05-04 DIAGNOSIS — R5383 Other fatigue: Secondary | ICD-10-CM

## 2015-05-04 DIAGNOSIS — I70211 Atherosclerosis of native arteries of extremities with intermittent claudication, right leg: Secondary | ICD-10-CM | POA: Diagnosis not present

## 2015-05-04 DIAGNOSIS — Z8249 Family history of ischemic heart disease and other diseases of the circulatory system: Secondary | ICD-10-CM | POA: Insufficient documentation

## 2015-05-04 DIAGNOSIS — I739 Peripheral vascular disease, unspecified: Secondary | ICD-10-CM | POA: Diagnosis present

## 2015-05-04 DIAGNOSIS — J449 Chronic obstructive pulmonary disease, unspecified: Secondary | ICD-10-CM | POA: Diagnosis not present

## 2015-05-04 DIAGNOSIS — Z87891 Personal history of nicotine dependence: Secondary | ICD-10-CM | POA: Diagnosis not present

## 2015-05-04 DIAGNOSIS — I70219 Atherosclerosis of native arteries of extremities with intermittent claudication, unspecified extremity: Secondary | ICD-10-CM | POA: Diagnosis present

## 2015-05-04 DIAGNOSIS — Z79899 Other long term (current) drug therapy: Secondary | ICD-10-CM

## 2015-05-04 DIAGNOSIS — D689 Coagulation defect, unspecified: Secondary | ICD-10-CM

## 2015-05-04 HISTORY — PX: PERIPHERAL VASCULAR CATHETERIZATION: SHX172C

## 2015-05-04 HISTORY — DX: Low back pain: M54.5

## 2015-05-04 HISTORY — DX: Unspecified osteoarthritis, unspecified site: M19.90

## 2015-05-04 HISTORY — DX: Gastro-esophageal reflux disease without esophagitis: K21.9

## 2015-05-04 HISTORY — DX: Low back pain, unspecified: M54.50

## 2015-05-04 HISTORY — PX: ABDOMINAL AORTAGRAM: SHX5454

## 2015-05-04 HISTORY — DX: Other chronic pain: G89.29

## 2015-05-04 LAB — POCT ACTIVATED CLOTTING TIME
Activated Clotting Time: 202 seconds
Activated Clotting Time: 220 seconds
Activated Clotting Time: 165 seconds

## 2015-05-04 SURGERY — LOWER EXTREMITY ANGIOGRAPHY
Anesthesia: LOCAL

## 2015-05-04 MED ORDER — SODIUM CHLORIDE 0.9 % WEIGHT BASED INFUSION
3.0000 mL/kg/h | INTRAVENOUS | Status: DC
  Administered 2015-05-04: 3 mL/kg/h via INTRAVENOUS

## 2015-05-04 MED ORDER — CLOPIDOGREL BISULFATE 300 MG PO TABS
ORAL_TABLET | ORAL | Status: AC
  Filled 2015-05-04: qty 1

## 2015-05-04 MED ORDER — TRAMADOL HCL 50 MG PO TABS
50.0000 mg | ORAL_TABLET | ORAL | Status: DC | PRN
  Administered 2015-05-04: 50 mg via ORAL
  Filled 2015-05-04: qty 1

## 2015-05-04 MED ORDER — HEPARIN SODIUM (PORCINE) 1000 UNIT/ML IJ SOLN
INTRAMUSCULAR | Status: DC | PRN
  Administered 2015-05-04: 2000 [IU] via INTRAVENOUS
  Administered 2015-05-04: 5000 [IU] via INTRAVENOUS

## 2015-05-04 MED ORDER — POLYETHYLENE GLYCOL 3350 17 G PO PACK: 17.0000 g | PACK | Freq: Every evening | ORAL | Status: DC | PRN

## 2015-05-04 MED ORDER — LIDOCAINE HCL (PF) 1 % IJ SOLN
INTRAMUSCULAR | Status: AC
  Filled 2015-05-04: qty 30

## 2015-05-04 MED ORDER — OXYBUTYNIN CHLORIDE 5 MG PO TABS
5.0000 mg | ORAL_TABLET | Freq: Three times a day (TID) | ORAL | Status: DC | PRN
  Filled 2015-05-04: qty 1

## 2015-05-04 MED ORDER — SODIUM CHLORIDE 0.9 % WEIGHT BASED INFUSION: 1.0000 mL/kg/h | INTRAVENOUS | Status: DC

## 2015-05-04 MED ORDER — MIDAZOLAM HCL 2 MG/2ML IJ SOLN
INTRAMUSCULAR | Status: AC
  Filled 2015-05-04: qty 2

## 2015-05-04 MED ORDER — CLOPIDOGREL BISULFATE 75 MG PO TABS
75.0000 mg | ORAL_TABLET | Freq: Every day | ORAL | Status: DC
  Administered 2015-05-05: 09:00:00 75 mg via ORAL
  Filled 2015-05-04: qty 1

## 2015-05-04 MED ORDER — HEPARIN SODIUM (PORCINE) 1000 UNIT/ML IJ SOLN
INTRAMUSCULAR | Status: AC
  Filled 2015-05-04: qty 1

## 2015-05-04 MED ORDER — NITROGLYCERIN 0.2 MG/ML ON CALL CATH LAB
INTRAVENOUS | Status: DC | PRN
  Administered 2015-05-04: 200 ug via INTRA_ARTERIAL

## 2015-05-04 MED ORDER — TRAMADOL HCL ER 300 MG PO TB24: 300.0000 mg | ORAL_TABLET | Freq: Every day | ORAL | Status: DC

## 2015-05-04 MED ORDER — ASPIRIN 81 MG PO CHEW
81.0000 mg | CHEWABLE_TABLET | ORAL | Status: AC
  Administered 2015-05-04: 81 mg via ORAL

## 2015-05-04 MED ORDER — NITROGLYCERIN 1 MG/10 ML FOR IR/CATH LAB
INTRA_ARTERIAL | Status: AC
  Filled 2015-05-04: qty 10

## 2015-05-04 MED ORDER — IRBESARTAN 300 MG PO TABS
300.0000 mg | ORAL_TABLET | Freq: Every day | ORAL | Status: DC
  Administered 2015-05-05: 09:00:00 300 mg via ORAL
  Filled 2015-05-04: qty 1

## 2015-05-04 MED ORDER — ASPIRIN 81 MG PO CHEW
CHEWABLE_TABLET | ORAL | Status: AC
  Filled 2015-05-04: qty 1

## 2015-05-04 MED ORDER — SODIUM CHLORIDE 0.9 % IV SOLN: INTRAVENOUS | Status: AC

## 2015-05-04 MED ORDER — MIDAZOLAM HCL 2 MG/2ML IJ SOLN
INTRAMUSCULAR | Status: DC | PRN
  Administered 2015-05-04: 1 mg via INTRAVENOUS

## 2015-05-04 MED ORDER — TRAMADOL HCL 50 MG PO TABS
50.0000 mg | ORAL_TABLET | Freq: Every evening | ORAL | Status: DC | PRN
  Administered 2015-05-05: 50 mg via ORAL
  Filled 2015-05-04: qty 1

## 2015-05-04 MED ORDER — LIDOCAINE HCL (PF) 1 % IJ SOLN
INTRAMUSCULAR | Status: DC | PRN
  Administered 2015-05-04: 12 mL

## 2015-05-04 MED ORDER — IODIXANOL 320 MG/ML IV SOLN
INTRAVENOUS | Status: DC | PRN
Start: 2015-05-04 — End: 2015-05-04
  Administered 2015-05-04: 195 mL via INTRA_ARTERIAL

## 2015-05-04 MED ORDER — FENTANYL CITRATE (PF) 100 MCG/2ML IJ SOLN
INTRAMUSCULAR | Status: DC | PRN
  Administered 2015-05-04: 25 ug via INTRAVENOUS

## 2015-05-04 MED ORDER — HYDRALAZINE HCL 20 MG/ML IJ SOLN: 10.0000 mg | INTRAMUSCULAR | Status: DC | PRN

## 2015-05-04 MED ORDER — FENTANYL CITRATE (PF) 100 MCG/2ML IJ SOLN
INTRAMUSCULAR | Status: AC
  Filled 2015-05-04: qty 2

## 2015-05-04 MED ORDER — AMLODIPINE BESYLATE 10 MG PO TABS
10.0000 mg | ORAL_TABLET | Freq: Every day | ORAL | Status: DC
  Administered 2015-05-05: 09:00:00 10 mg via ORAL
  Filled 2015-05-04 (×2): qty 1

## 2015-05-04 MED ORDER — CLOPIDOGREL BISULFATE 300 MG PO TABS
ORAL_TABLET | ORAL | Status: DC | PRN
  Administered 2015-05-04: 300 mg via ORAL

## 2015-05-04 MED ORDER — ASPIRIN EC 325 MG PO TBEC: 325.0000 mg | DELAYED_RELEASE_TABLET | Freq: Every day | ORAL | Status: DC

## 2015-05-04 MED ORDER — HEPARIN (PORCINE) IN NACL 2-0.9 UNIT/ML-% IJ SOLN
INTRAMUSCULAR | Status: AC
  Filled 2015-05-04: qty 1000

## 2015-05-04 MED ORDER — HYDROCHLOROTHIAZIDE 12.5 MG PO CAPS
12.5000 mg | ORAL_CAPSULE | Freq: Every day | ORAL | Status: DC
  Administered 2015-05-05: 12.5 mg via ORAL
  Filled 2015-05-04: qty 1

## 2015-05-04 MED ORDER — ACETAMINOPHEN 325 MG PO TABS: 650.0000 mg | ORAL_TABLET | ORAL | Status: DC | PRN

## 2015-05-04 MED ORDER — SODIUM CHLORIDE 0.9 % IJ SOLN: 3.0000 mL | INTRAMUSCULAR | Status: DC | PRN

## 2015-05-04 MED ORDER — ONDANSETRON HCL 4 MG/2ML IJ SOLN: 4.0000 mg | Freq: Four times a day (QID) | INTRAMUSCULAR | Status: DC | PRN

## 2015-05-04 MED ORDER — OLMESARTAN-AMLODIPINE-HCTZ 40-10-12.5 MG PO TABS: ORAL_TABLET | Freq: Every morning | ORAL | Status: DC

## 2015-05-04 SURGICAL SUPPLY — 19 items
CATH ANGIO 5F PIGTAIL 65CM (CATHETERS) ×1 IMPLANT
CATH STRAIGHT 5FR 65CM (CATHETERS) ×1 IMPLANT
HAND CONTROLLER AVANTA (MISCELLANEOUS) ×1 IMPLANT
KIT ENCORE 26 ADVANTAGE (KITS) ×1 IMPLANT
KIT PV (KITS) ×3 IMPLANT
SET AVANTA MULTI PATIENT (MISCELLANEOUS) IMPLANT
SET AVANTA SINGLE PATIENT (MISCELLANEOUS) ×1 IMPLANT
SHEATH AVANTA HAND CONTROLLER (MISCELLANEOUS) ×1 IMPLANT
SHEATH BRITE TIP 7FR 35CM (SHEATH) ×1 IMPLANT
SHEATH PINNACLE 5F 10CM (SHEATH) ×1 IMPLANT
SHEATH PINNACLE 7F 10CM (SHEATH) ×1 IMPLANT
STENT ICAST 7X22X120 (Permanent Stent) ×1 IMPLANT
SYR MEDRAD MARK V 150ML (SYRINGE) IMPLANT
TAPE RADIOPAQUE TURBO (MISCELLANEOUS) ×1 IMPLANT
TRANSDUCER W/STOPCOCK (MISCELLANEOUS) ×3 IMPLANT
TRAY PV CATH (CUSTOM PROCEDURE TRAY) ×3 IMPLANT
TUBING CIL FLEX 10 FLL-RA (TUBING) ×1 IMPLANT
WIRE HITORQ VERSACORE ST 145CM (WIRE) ×1 IMPLANT
WIRE VERSACORE LOC 115CM (WIRE) ×1 IMPLANT

## 2015-05-04 NOTE — H&P (View-Only) (Signed)
04/26/2015 Cheryl Atkinson   04/23/1931  [id number]  Primary Physician Cheryl Conch, MD Primary Cardiologist: Cheryl Gess MD Cheryl Atkinson   HPI:  [name] is an 81 year old mildly overweight widowed Caucasian female mother of 2 children, grandmother and one grandchildren referred by Dr. Shirlean Atkinson, podiatrist, for evaluation treatment of peripheral arterial disease. Her primary care physician is Dr. Annice Atkinson. She is a retired Location manager. She smoked 30 pack years and quit one month ago. Her only other risk factor is hypertension and family history. Her father died of a myocardial infarction in his early 57s. She has never had a heart attack or stroke. She denies chest pain or shortness of breath. She does have bilateral lifestyle limiting claudication.   Current Outpatient Prescriptions  Medication Sig Dispense Refill  . NEUPRO 2 MG/24HR APPLY 1 PATCH ONTO THE SKIN DAILY 90 patch 1  . Olmesartan-Amlodipine-HCTZ (TRIBENZOR) 40-10-12.5 MG TABS TAKE 1 TABLET DAILY 90 tablet 1  . oxybutynin (DITROPAN) 5 MG tablet Take 1 tablet (5 mg total) by mouth 3 (three) times daily as needed for bladder spasms. 30 tablet 0  . phenazopyridine (PYRIDIUM) 100 MG tablet Take 1-2 tablets (100-200 mg total) by mouth 3 (three) times daily as needed for pain. 30 tablet 0  . traMADol (ULTRAM-ER) 300 MG 24 hr tablet Take 300 mg by mouth daily.     No current facility-administered medications for this visit.    Allergies  Allergen Reactions  . Codeine Other (See Comments)    REACTION: Syncope     History   Social History  . Marital Status: Widowed    Spouse Name: N/A  . Number of Children: N/A  . Years of Education: N/A   Occupational History  . Not on file.   Social History Main Topics  . Smoking status: Current Every Day Smoker -- 0.50 packs/day    Types: Cigarettes  . Smokeless tobacco: Never Used  . Alcohol Use: No  . Drug Use: No  . Sexual Activity: No    Other Topics Concern  . Not on file   Social History Narrative   Widowed 2013. 2 sons. 1 grandchild.       Retired from [employer].       Hobbies: time at home and with family      HCPOA: sister-in-law and brother. Cheryl Atkinson.       DNR/DNI      Regular exercise: none   Caffeine use: cup of coffee      Review of Systems: General: negative for chills, fever, night sweats or weight changes.  Cardiovascular: negative for chest pain, dyspnea on exertion, edema, orthopnea, palpitations, paroxysmal nocturnal dyspnea or shortness of breath Dermatological: negative for rash Respiratory: negative for cough or wheezing Urologic: negative for hematuria Abdominal: negative for nausea, vomiting, diarrhea, bright red blood per rectum, melena, or hematemesis Neurologic: negative for visual changes, syncope, or dizziness All other systems reviewed and are otherwise negative except as noted above.    Blood pressure 120/60, pulse 68, height  (1.676 m), weight 164 lb 3.2 oz (74.481 kg).  General appearance: alert and no distress Neck: no adenopathy, no carotid bruit, no JVD, supple, symmetrical, trachea midline and thyroid not enlarged, symmetric, no tenderness/mass/nodules Lungs: clear to auscultation bilaterally Heart: regular rate and rhythm, S1, S2 normal, no murmur, click, rub or gallop Extremities: extremities normal, atraumatic, no cyanosis or edema  EKG not performed today  ASSESSMENT AND PLAN:   Peripheral  arterial disease Patient was referred by Dr. Ouida SillsMatt Atkinson, podiatrist, for evaluation and treatment of peripheral arterial disease. She was scheduled to have a procedure performed on her right foot but no pulses were felt. She does complain of bilateral lifestyle limiting lower extremity claudication. Dopplers performed in our office 04/07/15 revealed ABIs in the 0.6 range bilaterally with high-grade proximal right common iliac artery stenosis and an occluded mid left  SFA . I'm going to arrange for her to undergo angiography and potential percutaneous intervention.       Cheryl GessJonathan J. Neville Walston MD FACP,FACC,FAHA, [facility] 04/26/2015 1:57 PM

## 2015-05-04 NOTE — Progress Notes (Signed)
Site area: right groin  Site Prior to Removal:  Level 0  Pressure Applied For 20 MINUTES    Minutes Beginning at 1400  Manual:   Yes.    Patient Status During Pull:  AAO X 4  Post Pull Groin Site:  Level 0  Post Pull Instructions Given:  Yes.    Post Pull Pulses Present:  Yes.    Dressing Applied:  Yes.    Comments:  Tolerated procedure well 

## 2015-05-04 NOTE — Interval H&P Note (Signed)
History and Physical Interval Note:  05/04/2015 10:37 AM  Crissie ReeseBetty L Fukuhara  has presented today for surgery, with the diagnosis of pad  The various methods of treatment have been discussed with the patient and family. After consideration of risks, benefits and other options for treatment, the patient has consented to  Procedure(s): Lower Extremity Angiography (N/A) as a surgical intervention .  The patient's history has been reviewed, patient examined, no change in status, stable for surgery.  I have reviewed the patient's chart and labs.  Questions were answered to the patient's satisfaction.     Nanetta BattyBerry, Yamir Carignan

## 2015-05-04 NOTE — Research (Signed)
Safe Registry Informed Consent   Subject Name: Cheryl Atkinson  Subject met inclusion and exclusion criteria.  The informed consent form, study requirements and expectations were reviewed with the subject and questions and concerns were addressed prior to the signing of the consent form.  The subject verbalized understanding of the trail requirements.  The subject agreed to participate in the SAFE trial and signed the informed consent.  The informed consent was obtained prior to performance of any protocol-specific procedures for the subject.  A copy of the signed informed consent was given to the subject and a copy was placed in the subject's medical record.  Sandie Ano 05/04/2015, 9:55

## 2015-05-05 ENCOUNTER — Other Ambulatory Visit: Payer: Self-pay | Admitting: Cardiology

## 2015-05-05 DIAGNOSIS — I739 Peripheral vascular disease, unspecified: Secondary | ICD-10-CM

## 2015-05-05 DIAGNOSIS — J449 Chronic obstructive pulmonary disease, unspecified: Secondary | ICD-10-CM | POA: Diagnosis not present

## 2015-05-05 DIAGNOSIS — F17210 Nicotine dependence, cigarettes, uncomplicated: Secondary | ICD-10-CM | POA: Diagnosis not present

## 2015-05-05 DIAGNOSIS — I10 Essential (primary) hypertension: Secondary | ICD-10-CM | POA: Diagnosis not present

## 2015-05-05 DIAGNOSIS — Z8249 Family history of ischemic heart disease and other diseases of the circulatory system: Secondary | ICD-10-CM | POA: Diagnosis not present

## 2015-05-05 DIAGNOSIS — Z87891 Personal history of nicotine dependence: Secondary | ICD-10-CM | POA: Diagnosis not present

## 2015-05-05 DIAGNOSIS — I70211 Atherosclerosis of native arteries of extremities with intermittent claudication, right leg: Secondary | ICD-10-CM | POA: Diagnosis not present

## 2015-05-05 LAB — BASIC METABOLIC PANEL
Anion gap: 6 (ref 5–15)
BUN: 12 mg/dL (ref 6–20)
CO2: 27 mmol/L (ref 22–32)
Calcium: 8.8 mg/dL — ABNORMAL LOW (ref 8.9–10.3)
Chloride: 105 mmol/L (ref 101–111)
Creatinine, Ser: 0.72 mg/dL (ref 0.44–1.00)
GFR calc Af Amer: >60 mL/min (ref >60)
GFR calc non Af Amer: >60 mL/min (ref >60)
Glucose, Bld: 109 mg/dL — ABNORMAL HIGH (ref 65–99)
Potassium: 3.8 mmol/L (ref 3.5–5.1)
Sodium: 138 mmol/L (ref 135–145)

## 2015-05-05 LAB — CBC
HCT: 32.7 % — ABNORMAL LOW (ref 36.0–46.0)
Hemoglobin: 10.6 g/dL — ABNORMAL LOW (ref 12.0–15.0)
MCH: 28 pg (ref 26.0–34.0)
MCHC: 32.4 g/dL (ref 30.0–36.0)
MCV: 86.3 fL (ref 78.0–100.0)
Platelets: 272 10*3/uL (ref 150–400)
RBC: 3.79 MIL/uL — ABNORMAL LOW (ref 3.87–5.11)
RDW: 13.4 % (ref 11.5–15.5)
WBC: 8.3 10*3/uL (ref 4.0–10.5)

## 2015-05-05 MED ORDER — ACETAMINOPHEN 325 MG PO TABS: 650.0000 mg | ORAL_TABLET | ORAL | Status: DC | PRN

## 2015-05-05 MED ORDER — ATORVASTATIN CALCIUM 40 MG PO TABS: 40.0000 mg | ORAL_TABLET | Freq: Every day | ORAL | Status: DC

## 2015-05-05 MED ORDER — ASPIRIN 81 MG PO TBEC: 81.0000 mg | DELAYED_RELEASE_TABLET | Freq: Every day | ORAL | Status: DC

## 2015-05-05 MED ORDER — CLOPIDOGREL BISULFATE 75 MG PO TABS: 75.0000 mg | ORAL_TABLET | Freq: Every day | ORAL | Status: DC

## 2015-05-05 MED ORDER — ASPIRIN EC 81 MG PO TBEC
81.0000 mg | DELAYED_RELEASE_TABLET | Freq: Every day | ORAL | Status: DC
  Administered 2015-05-05: 09:00:00 81 mg via ORAL
  Filled 2015-05-05: qty 1

## 2015-05-05 MED FILL — Heparin Sodium (Porcine) 2 Unit/ML in Sodium Chloride 0.9%: INTRAMUSCULAR | Qty: 1000 | Status: AC

## 2015-05-05 NOTE — Discharge Summary (Signed)
Patient ID: Cheryl Atkinson,  MRN: [id number], DOB/AGE: 79-Apr-1932 79 y.o.  Admit date: 05/04/2015 Discharge date: 05/05/2015  Primary Care Provider: Tana Conch, MD Primary Cardiologist: Dr Allyson Sabal  Discharge Diagnoses Principal Problem:   Claudication Active Problems:   PVD- s/p RCIA PTA 05/04/15   Essential hypertension   COPD (chronic obstructive pulmonary disease)    Procedures: PVA- RCIA PTA 05/04/15   Hospital Course:  81 year old mildly overweight widowed Caucasian female mother of 2 children, grandmother and one grandchildren referred by Dr. Shirlean Kelly, podiatrist, for evaluation treatment of peripheral arterial disease. Her primary care physician is Dr. Annice Pih. She is a retired Location manager. She smoked 30 pack years and quit one month ago. Her only other risk factor is hypertension and family history. Her father died of a myocardial infarction in his early 6s. She has never had a heart attack or stroke. She denies chest pain or shortness of breath. She does have bilateral lifestyle limiting claudication. She presents now for angiography and potential endovascular therapy. PVA 05/04/15 revealed bilateral SFA disease, with a hemodynamically significant ostial right common iliac artery stenosis. She also has a small distal abdominal aortic aneurysm measuring 2.7 cm. She underwent successful PTA and stenting of a hemodynamically significant ostial right common iliac artery stenosis. She'll have OP LE dopplers in two weeks and an OV with Dr Allyson Sabal in 3 weeks. We did add a statin at discharge and she will need f/u lipids and LFTs in 6-8 weeks.   Discharge Vitals:  Blood pressure 138/52, pulse 63, temperature 98.1 F (36.7 C), temperature source Oral, resp. rate 18, height  (1.676 m), weight 164 lb 7.4 oz (74.6 kg), SpO2 96 %.    Labs: Results for orders placed or performed during the hospital encounter of 05/04/15 (from the past 24 hour(s))  POCT Activated clotting  time     Status: None   Collection Time: 05/04/15 11:07 AM  Result Value Ref Range   Activated Clotting Time 202 seconds  POCT Activated clotting time     Status: None   Collection Time: 05/04/15 11:22 AM  Result Value Ref Range   Activated Clotting Time 220 seconds  POCT Activated clotting time     Status: None   Collection Time: 05/04/15  1:11 PM  Result Value Ref Range   Activated Clotting Time 165 seconds  CBC     Status: Abnormal   Collection Time: 05/05/15  5:22 AM  Result Value Ref Range   WBC 8.3 4.0 - 10.5 K/uL   RBC 3.79 (L) 3.87 - 5.11 MIL/uL   Hemoglobin 10.6 (L) 12.0 - 15.0 g/dL   HCT 40.9 (L) 81.1 - 91.4 %   MCV 86.3 78.0 - 100.0 fL   MCH 28.0 26.0 - 34.0 pg   MCHC 32.4 30.0 - 36.0 g/dL   RDW 78.2 95.6 - 21.3 %   Platelets 272 150 - 400 K/uL  Basic metabolic panel      Status: Abnormal   Collection Time: 05/05/15  5:22 AM  Result Value Ref Range   Sodium 138 135 - 145 mmol/L   Potassium 3.8 3.5 - 5.1 mmol/L   Chloride 105 101 - 111 mmol/L   CO2 27 22 - 32 mmol/L   Glucose, Bld 109 (H) 65 - 99 mg/dL   BUN 12 6 - 20 mg/dL   Creatinine, Ser 0.86 0.44 - 1.00 mg/dL   Calcium 8.8 (L) 8.9 - 10.3 mg/dL   GFR calc non  Af Amer >60 >60 mL/min   GFR calc Af Amer >60 >60 mL/min   Anion gap 6 5 - 15    Disposition:  Follow-up Information    Follow up with Nanetta BattyBerry, Jonathan, MD.   Specialties:  Cardiology, Radiology   Why:  office will contact you   Contact information:   [address] [place] [address] [phone]       Discharge Medications:    Medication List    STOP taking these medications        phenazopyridine 100 MG tablet  Commonly known as:  PYRIDIUM      TAKE these medications        acetaminophen 325 MG tablet  Commonly known as:  TYLENOL  Take 2 tablets (650 mg total) by mouth every 4 (four) hours as needed for headache or mild pain.     aspirin 81 MG EC tablet  Take 1 tablet (81 mg total) by mouth daily.      atorvastatin 40 MG tablet  Commonly known as:  LIPITOR  Take 1 tablet (40 mg total) by mouth daily at 6 PM.     clopidogrel 75 MG tablet  Commonly known as:  PLAVIX  Take 1 tablet (75 mg total) by mouth daily with breakfast.     NEUPRO 2 MG/24HR  Generic drug:  rotigotine  APPLY 1 PATCH ONTO THE SKIN DAILY     Olmesartan-Amlodipine-HCTZ 40-10-12.5 MG Tabs  Commonly known as:  TRIBENZOR  TAKE 1 TABLET DAILY     oxybutynin 5 MG tablet  Commonly known as:  DITROPAN  Take 1 tablet (5 mg total) by mouth 3 (three) times daily as needed for bladder spasms.     polyethylene glycol packet  Commonly known as:  MIRALAX / GLYCOLAX  Take 17 g by mouth at bedtime as needed for mild constipation.     traMADol 300 MG 24 hr tablet  Commonly known as:  ULTRAM-ER  Take 300 mg by mouth daily.     traMADol 50 MG tablet  Commonly known as:  ULTRAM  Take 50 mg by mouth at bedtime as needed for moderate pain.         Duration of Discharge Encounter: Greater than 30 minutes including physician time.  Jolene ProvostSigned, Iliyah Bui PA-C 05/05/2015 8:24 AM

## 2015-05-05 NOTE — Discharge Instructions (Signed)
Angiogram °An angiogram, also called angiography, is a procedure used to look at the blood vessels that carry blood to different parts of your body (arteries). In this procedure, dye is injected through a long, thin tube (catheter) into an artery. X-rays are then taken. The X-rays will show if there is a blockage or problem in a blood vessel.  °LET YOUR HEALTH CARE PROVIDER KNOW ABOUT: °· Any allergies you have, including allergies to shellfish or contrast dye.   °· All medicines you are taking, including vitamins, herbs, eye drops, creams, and over-the-counter medicines.   °· Previous problems you or members of your family have had with the use of anesthetics.   °· Any blood disorders you have.   °· Previous surgeries you have had. °· Any previous kidney problems or failure you have had.  °· Medical conditions you have.   °· Possibility of pregnancy, if this applies. °RISKS AND COMPLICATIONS °Generally, an angiogram is a safe procedure. However, as with any procedure, problems can occur. Possible problems include: °· Injury to the blood vessels, including rupture or bleeding. °· Infection or bruising at the catheter site. °· Allergic reaction to the dye or contrast used. °· Kidney damage from the dye or contrast used. °· Blood clots that can lead to a stroke or heart attack. °BEFORE THE PROCEDURE °· Do not eat or drink after midnight on the night before the procedure, or as directed by your health care provider.   °· Ask your health care provider if you may drink enough water to take any needed medicines the morning of the procedure.   °PROCEDURE °· You may be given a medicine to help you relax (sedative) before and during the procedure. This medicine is given through an IV access tube that is inserted into one of your veins.   °· The area where the catheter will be inserted will be washed and shaved. This is usually done in the groin but may be done in the fold of your arm (near your elbow) or in the wrist. °· A  medicine will be given to numb the area where the catheter will be inserted (local anesthetic). °· The catheter will be inserted with a guide wire into an artery. The catheter is guided by using a type of X-ray (fluoroscopy) to the blood vessel being examined.   °· Dye is then injected into the catheter, and X-rays are taken. The dye helps to show where any narrowing or blockages are located.   °AFTER THE PROCEDURE  °· If the procedure is done through the leg, you will be kept in bed lying flat for several hours. You will be instructed to not bend or cross your legs. °· The insertion site will be checked frequently. °· The pulse in your feet or wrist will be checked frequently. °· Additional blood tests, X-rays, and electrocardiography may be done.   °· You may need to stay in the hospital overnight for observation.   °Document Released: 08/28/2005 Document Revised: 11/23/2013 Document Reviewed: 04/21/2013 °ExitCare® Patient Information ©2015 [employer]. This information is not intended to replace advice given to you by your health care provider. Make sure you discuss any questions you have with your health care provider. ° °

## 2015-05-05 NOTE — Progress Notes (Signed)
    Subjective:  No complaints  Objective:  Vital Signs in the last 24 hours: Temp:  [97.7 F (36.5 C)-98.2 F (36.8 C)] 98 F (36.7 C) (06/03 0429) Pulse Rate:  [0-78] 58 (06/03 0429) Resp:  [0-20] 20 (06/03 0429) BP: (111-152)/(49-76) 144/58 mmHg (06/03 0429) SpO2:  [0 %-100 %] 95 % (06/03 0429) Weight:  [160 lb (72.576 kg)-164 lb 7.4 oz (74.6 kg)] 164 lb 7.4 oz (74.6 kg) (06/03 0014)  Intake/Output from previous day:  Intake/Output Summary (Last 24 hours) at 05/05/15 0722 Last data filed at 05/05/15 [id number]  Gross per 24 hour  Intake 663.75 ml  Output   1000 ml  Net -336.25 ml    Physical Exam: General appearance: alert, cooperative and no distress Lungs: clear to auscultation bilaterally Heart: regular rate and rhythm Abdomen: not distended Extremities: Rt groin without hematoma   Rate: 58  Rhythm: normal sinus rhythm and sinus bradycardia  Lab Results:  Recent Labs  05/05/15 0522  WBC 8.3  HGB 10.6*  PLT 272    Recent Labs  05/05/15 0522  NA 138  K 3.8  CL 105  CO2 27  GLUCOSE 109*  BUN 12  CREATININE 0.72   No results for input(s): TROPONINI in the last 72 hours.  Invalid input(s): CK, MB No results for input(s): INR in the last 72 hours.  Scheduled Meds: . amLODipine  10 mg Oral Daily   And  . hydrochlorothiazide  12.5 mg Oral Daily  . aspirin EC  81 mg Oral Daily  . atorvastatin  40 mg Oral q1800  . clopidogrel  75 mg Oral Q breakfast  . irbesartan  300 mg Oral Daily   Continuous Infusions:  PRN Meds:.acetaminophen, hydrALAZINE, ondansetron (ZOFRAN) IV, oxybutynin, polyethylene glycol, traMADol, traMADol   Imaging: Imaging results have been reviewed   Assessment/Plan:  81 year old mildly overweight widowed Caucasian female mother of 2 children, grandmother and one grandchildren referred by Dr. Shirlean KellyMatt Wagner, podiatrist, for evaluation treatment of peripheral arterial disease. Her primary care physician is Dr. Annice PihSteven Hunter. 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 She smoked 30 pack years and quit one month ago. Her only other risk factor is hypertension and family history. Her father died of a myocardial infarction in his early 2660s. She has never had a heart attack or stroke. She denies chest pain or shortness of breath. She does have bilateral lifestyle limiting claudication. She presents now for angiography and potential endovascular therapy. PVA 05/04/15 revealed bilateral SFA disease, with a hemodynamically significant ostial right common iliac artery stenosis. She also has a small distal abdominal aortic aneurysm measuring 2.7 cm. She underwent successful PTA and stenting of a hemodynamically significant ostial right common iliac artery stenosis.    Principal Problem:   Claudication Active Problems:   PVD- s/p RCIA PTA 05/04/15   Essential hypertension   COPD (chronic obstructive pulmonary disease)   PLAN: OP LEA dopplers in 2 weeks, f/u with Dr Dorma RussellJB 2-3 weeks. Add statin Rx and check f/u lipids and LFTS in 6 weeks.   Corine ShelterLuke Marquarius Lofton PA-C 05/05/2015, 7:22 AM [phone]   Patient seen and examined. Agree with assessment and plan.S/P PTA/stenting of R common iliac.  Feels well; no chest pain or SOB. Ambulate this am. Groin site stable. DC this am.   Lennette Biharihomas A. Kelly, MD, [facility] 05/05/2015 7:46 AM

## 2015-05-14 ENCOUNTER — Other Ambulatory Visit: Payer: Self-pay | Admitting: Family Medicine

## 2015-05-23 ENCOUNTER — Ambulatory Visit (INDEPENDENT_AMBULATORY_CARE_PROVIDER_SITE_OTHER): Payer: Medicare Other | Admitting: Cardiovascular Disease

## 2015-05-23 ENCOUNTER — Encounter: Payer: Self-pay | Admitting: Cardiovascular Disease

## 2015-05-23 VITALS — BP 128/60 | HR 68 | Ht 62.0 in | Wt 162.8 lb

## 2015-05-23 DIAGNOSIS — I714 Abdominal aortic aneurysm, without rupture, unspecified: Secondary | ICD-10-CM

## 2015-05-23 DIAGNOSIS — I739 Peripheral vascular disease, unspecified: Secondary | ICD-10-CM

## 2015-05-23 NOTE — Assessment & Plan Note (Signed)
History of peripheral arterial disease status post angiogram performed 05/04/15 revealing a hemodynamically significant ostial right common iliac arteries stenosis which I stented with a 7 mm x 22 mm long I cast covered stent. There was a 25 mm gradient. There was a small abdominal aortic aneurysm. Both SFAs were occluded. Since her procedure her right leg feels somewhat improved. She still has occluded SFAs bilaterally and has not walked enough to determine whether these are lifestyle limiting. I'm going to get abdominal ultrasound to measure her aneurysm which I demonstrated angiographically as well as lower extremity arterial Doppler studies to demonstrate improvement in blood flow. I will see her back in 3 months which time we will reassess her symptoms and potential need for endovascular treatment of her SFA disease.

## 2015-05-23 NOTE — Patient Instructions (Signed)
  We will see you back in follow up in 3 months with Dr Allyson Sabal.  Dr Allyson Sabal has ordered: Lower extremity arterial doppler- During this test, ultrasound is used to evaluate arterial blood flow in the legs. Allow approximately one hour for this exam.   Abdominal aorta duplex. During this test, an ultrasound is used to evaluate the aorta. Allow 30 minutes for this exam. Do not eat after midnight the day before and avoid carbonated beverages

## 2015-05-23 NOTE — Progress Notes (Signed)
05/23/2015 Cheryl Atkinson   03-21-1931  [id number]  Primary Physician Cheryl Conch, MD Primary Cardiologist: Cheryl Gess MD Cheryl Atkinson   HPI:  [name] is an 81 year old mildly overweight widowed Caucasian female mother of 2 children, grandmother and one grandchildren referred by Dr. Shirlean Atkinson, podiatrist, for evaluation treatment of peripheral arterial disease. Her primary care physician is Dr. Annice Atkinson. She is a retired [employer] manager. She smoked 30 pack years and quit one month ago. Her only other risk factor is hypertension and family history. Her father died of a myocardial infarction in his early 62s. She has never had a heart attack or stroke. She denies chest pain or shortness of breath. She does have bilateral lifestyle limiting claudication. She underwent peripheral angiography on 05/04/15 revealing a hemodynamically significant ostial right common iliac arteries stenosis which I stented with a 7 mm x 22 mm long I cast covered stent. There was a 25 mm gradient. There was a small abdominal aortic aneurysm. Both SFAs were occluded. Since her procedure her right leg feels somewhat improved. She still has occluded SFAs bilaterally and has not walked enough to determine whether these are lifestyle limiting. I'm going to get abdominal ultrasound to measure her aneurysm which I demonstrated angiographically as well as lower extremity arterial Doppler studies to demonstrate improvement in blood flow. I will see her back in 3 months which time we will reassess her symptoms and potential need for endovascular treatment of her SFA disease.  Current Outpatient Prescriptions  Medication Sig Dispense Refill  . acetaminophen (TYLENOL) 325 MG tablet Take 2 tablets (650 mg total) by mouth every 4 (four) hours as needed for headache or mild pain.    Marland Kitchen aspirin EC 81 MG EC tablet Take 1 tablet (81 mg total) by mouth daily.    Marland Kitchen atorvastatin (LIPITOR) 40 MG tablet Take 1  tablet (40 mg total) by mouth daily at 6 PM. 90 tablet 3  . clopidogrel (PLAVIX) 75 MG tablet Take 1 tablet (75 mg total) by mouth daily with breakfast. 90 tablet 3  . NEUPRO 2 MG/24HR APPLY 1 PATCH ONTO THE SKIN DAILY 90 patch 1  . polyethylene glycol (MIRALAX / GLYCOLAX) packet Take 17 g by mouth at bedtime as needed for mild constipation.    . traMADol (ULTRAM) 50 MG tablet Take 50 mg by mouth at bedtime as needed for moderate pain.    . traMADol (ULTRAM-ER) 300 MG 24 hr tablet Take 300 mg by mouth daily.    Cheryl Atkinson 40-10-12.5 MG TABS TAKE 1 TABLET DAILY 90 tablet 2   No current facility-administered medications for this visit.    Allergies  Allergen Reactions  . Codeine Other (See Comments)    REACTION: Syncope     History   Social History  . Marital Status: Widowed    Spouse Name: N/A  . Number of Children: N/A  . Years of Education: N/A   Occupational History  . Not on file.   Social History Main Topics  . Smoking status: Former Smoker -- 0.50 packs/day for 60 years    Types: Cigarettes    Quit date: 04/02/2015  . Smokeless tobacco: Never Used  . Alcohol Use: No  . Drug Use: No  . Sexual Activity: No   Other Topics Concern  . Not on file   Social History Narrative   Widowed 2013. 2 sons. 1 grandchild.       Retired from [employer].  Hobbies: time at home and with family      HCPOA: sister-in-law and brother. Cheryl Atkinson.       DNR/DNI      Regular exercise: none   Caffeine use: cup of coffee      Review of Systems: General: negative for chills, fever, night sweats or weight changes.  Cardiovascular: negative for chest pain, dyspnea on exertion, edema, orthopnea, palpitations, paroxysmal nocturnal dyspnea or shortness of breath Dermatological: negative for rash Respiratory: negative for cough or wheezing Urologic: negative for hematuria Abdominal: negative for nausea, vomiting, diarrhea, bright red blood per rectum, melena, or  hematemesis Neurologic: negative for visual changes, syncope, or dizziness All other systems reviewed and are otherwise negative except as noted above.    Blood pressure 128/60, pulse 68, height  (1.575 m), weight 162 lb 12.8 oz (73.846 kg).  General appearance: alert and no distress Neck: no adenopathy, no carotid bruit, no JVD, supple, symmetrical, trachea midline and thyroid not enlarged, symmetric, no tenderness/mass/nodules Lungs: clear to auscultation bilaterally Heart: regular rate and rhythm, S1, S2 normal, no murmur, click, rub or gallop Extremities: extremities normal, atraumatic, no cyanosis or edema  EKG not performed today  ASSESSMENT AND PLAN:   PVD- s/p RCIA PTA 05/04/15 History of peripheral arterial disease status post angiogram performed 05/04/15 revealing a hemodynamically significant ostial right common iliac arteries stenosis which I stented with a 7 mm x 22 mm long I cast covered stent. There was a 25 mm gradient. There was a small abdominal aortic aneurysm. Both SFAs were occluded. Since her procedure her right leg feels somewhat improved. She still has occluded SFAs bilaterally and has not walked enough to determine whether these are lifestyle limiting. I'm going to get abdominal ultrasound to measure her aneurysm which I demonstrated angiographically as well as lower extremity arterial Doppler studies to demonstrate improvement in blood flow. I will see her back in 3 months which time we will reassess her symptoms and potential need for endovascular treatment of her SFA disease.      Cheryl Gess MD FACP,FACC,FAHA, [facility] 05/23/2015 9:06 AM

## 2015-06-21 ENCOUNTER — Other Ambulatory Visit: Payer: Self-pay | Admitting: Cardiovascular Disease

## 2015-06-21 DIAGNOSIS — I739 Peripheral vascular disease, unspecified: Secondary | ICD-10-CM

## 2015-06-30 ENCOUNTER — Encounter: Payer: Self-pay | Admitting: Family Medicine

## 2015-06-30 ENCOUNTER — Ambulatory Visit (INDEPENDENT_AMBULATORY_CARE_PROVIDER_SITE_OTHER): Payer: Medicare Other | Admitting: Family Medicine

## 2015-06-30 DIAGNOSIS — I10 Essential (primary) hypertension: Secondary | ICD-10-CM

## 2015-06-30 DIAGNOSIS — R739 Hyperglycemia, unspecified: Secondary | ICD-10-CM

## 2015-06-30 DIAGNOSIS — Z720 Tobacco use: Secondary | ICD-10-CM

## 2015-06-30 DIAGNOSIS — E785 Hyperlipidemia, unspecified: Secondary | ICD-10-CM

## 2015-06-30 DIAGNOSIS — M545 Low back pain: Secondary | ICD-10-CM | POA: Diagnosis not present

## 2015-06-30 DIAGNOSIS — F17200 Nicotine dependence, unspecified, uncomplicated: Secondary | ICD-10-CM

## 2015-06-30 LAB — LIPID PANEL
Cholesterol: 173 mg/dL (ref 0–200)
HDL: 79.1 mg/dL (ref >39.00)
LDL Cholesterol: 81 mg/dL (ref 0–99)
NonHDL: 93.86
Total CHOL/HDL Ratio: 2
Triglycerides: 63 mg/dL (ref 0.0–149.0)
VLDL: 12.6 mg/dL (ref 0.0–40.0)

## 2015-06-30 LAB — CBC
HCT: 34.3 % — ABNORMAL LOW (ref 36.0–46.0)
Hemoglobin: 11.2 g/dL — ABNORMAL LOW (ref 12.0–15.0)
MCHC: 32.7 g/dL (ref 30.0–36.0)
MCV: 86.8 fl (ref 78.0–100.0)
Platelets: 309 10*3/uL (ref 150.0–400.0)
RBC: 3.95 Mil/uL (ref 3.87–5.11)
RDW: 14 % (ref 11.5–15.5)
WBC: 9.1 10*3/uL (ref 4.0–10.5)

## 2015-06-30 LAB — COMPREHENSIVE METABOLIC PANEL
ALT: 9 U/L (ref 0–35)
AST: 16 U/L (ref 0–37)
Albumin: 3.9 g/dL (ref 3.5–5.2)
Alkaline Phosphatase: 114 U/L (ref 39–117)
BUN: 22 mg/dL (ref 6–23)
CO2: 29 mEq/L (ref 19–32)
Calcium: 9.4 mg/dL (ref 8.4–10.5)
Chloride: 102 mEq/L (ref 96–112)
Creatinine, Ser: 0.75 mg/dL (ref 0.40–1.20)
GFR: 78.66 mL/min (ref >60.00)
Glucose, Bld: 99 mg/dL (ref 70–99)
Potassium: 3.8 mEq/L (ref 3.5–5.1)
Sodium: 138 mEq/L (ref 135–145)
Total Bilirubin: 0.5 mg/dL (ref 0.2–1.2)
Total Protein: 7.7 g/dL (ref 6.0–8.3)

## 2015-06-30 LAB — HEMOGLOBIN A1C: Hgb A1c MFr Bld: 6.4 % (ref 4.6–6.5)

## 2015-06-30 NOTE — Assessment & Plan Note (Signed)
S: tolerating atorvastatin . Reasonable control in past A/P: LDL today at 81 and all other lipids at goal. HDL high at 78. Known PAD so could push for LDL <70 but patient on high dose. Opted to continue current dose unless cardiology would like to push for lower.

## 2015-06-30 NOTE — Progress Notes (Signed)
Tana Conch, MD  Subjective:  Cheryl Atkinson is a 81 y.o. year old very pleasant female patient who presents with:  See problem oriented charting ROS- no chest pain, shortness of breath, does have claudication and returning to cards to discuss, no dizziness, nausea, blurry vision.   Past Medical History- PAD with claudication  Medications- reviewed and updated Current Outpatient Prescriptions  Medication Sig Dispense Refill  . acetaminophen (TYLENOL) 325 MG tablet Take 2 tablets (650 mg total) by mouth every 4 (four) hours as needed for headache or mild pain. (Patient not taking: Reported on 06/30/2015)    . aspirin EC 81 MG EC tablet Take 1 tablet (81 mg total) by mouth daily.    Marland Kitchen atorvastatin (LIPITOR) 40 MG tablet Take 1 tablet (40 mg total) by mouth daily at 6 PM. 90 tablet 3  . clopidogrel (PLAVIX) 75 MG tablet Take 1 tablet (75 mg total) by mouth daily with breakfast. 90 tablet 3  . NEUPRO 2 MG/24HR APPLY 1 PATCH ONTO THE SKIN DAILY 90 patch 1  . polyethylene glycol (MIRALAX / GLYCOLAX) packet Take 17 g by mouth at bedtime as needed for mild constipation.    . traMADol (ULTRAM) 50 MG tablet Take 50 mg by mouth at bedtime as needed for moderate pain.    . traMADol (ULTRAM-ER) 300 MG 24 hr tablet Take 300 mg by mouth daily.    Marya Landry 40-10-12.5 MG TABS TAKE 1 TABLET DAILY 90 tablet 2     Objective: BP 138/80 mmHg  Pulse 73  Temp(Src) 98.2 F (36.8 C)  Wt 161 lb (73.029 kg) Gen: NAD, resting comfortably CV: RRR no murmurs rubs or gallops Lungs: CTAB no crackles, wheeze, rhonchi Abdomen: soft/nontender/nondistended/normal bowel sounds. No rebound or guarding.  Ext: no edema Skin: warm, dry, no rash Neuro: walks slowly with cane   Assessment/Plan:  Smoker S: Quit cold [place] April 2016. Concern for PAD issues main motivating factor.  A/P: praised patient on success and we discussed continued maintenance off cigarettes.   Hyperglycemia S:admits to eating too  many sweets. Exercise limited by PAD A/P: fortunately a1c stable today at 6.4, but discussed cutting down on sweets and healthy eating for maintainence Lab Results  Component Value Date   HGBA1C 6.4 06/30/2015   Hyperlipidemia S: tolerating atorvastatin 40mg . Reasonable control in past A/P: LDL today at 81 and all other lipids at goal. HDL high at 78. Known PAD so could push for LDL <70 but patient on high dose. Opted to continue current dose unless cardiology would like to push for lower.   Essential hypertension S: controlle don olmesartan-amlodipine-hctz BP Readings from Last 3 Encounters:  06/30/15 138/80  05/23/15 128/60  05/05/15 138/52  A/P: continue current rx  3 month f/u.   Results for orders placed or performed in visit on 06/30/15 (from the past 24 hour(s))  CBC     Status: Abnormal   Collection Time: 06/30/15 10:16 AM  Result Value Ref Range   WBC 9.1 4.0 - 10.5 K/uL   RBC 3.95 3.87 - 5.11 Mil/uL   Platelets 309.0 150.0 - 400.0 K/uL   Hemoglobin 11.2 (L) 12.0 - 15.0 g/dL   HCT 91.4 (L) 78.2 - 95.6 %   MCV 86.8 78.0 - 100.0 fl   MCHC 32.7 30.0 - 36.0 g/dL   RDW 21.3 08.6 - 57.8 %  Comprehensive metabolic panel     Status: None   Collection Time: 06/30/15 10:16 AM  Result Value Ref Range  Sodium 138 135 - 145 mEq/L   Potassium 3.8 3.5 - 5.1 mEq/L   Chloride 102 96 - 112 mEq/L   CO2 29 19 - 32 mEq/L   Glucose, Bld 99 70 - 99 mg/dL   BUN 22 6 - 23 mg/dL   Creatinine, Ser 1.61 0.40 - 1.20 mg/dL   Total Bilirubin 0.5 0.2 - 1.2 mg/dL   Alkaline Phosphatase 114 39 - 117 U/L   AST 16 0 - 37 U/L   ALT 9 0 - 35 U/L   Total Protein 7.7 6.0 - 8.3 g/dL   Albumin 3.9 3.5 - 5.2 g/dL   Calcium 9.4 8.4 - 09.6 mg/dL   GFR 04.54 >09.81 mL/min  Lipid panel     Status: None   Collection Time: 06/30/15 10:16 AM  Result Value Ref Range   Cholesterol 173 0 - 200 mg/dL   Triglycerides 19.1 0.0 - 149.0 mg/dL   HDL 47.82 >95.62 mg/dL   VLDL 13.0 0.0 - 86.5 mg/dL   LDL  Cholesterol 81 0 - 99 mg/dL   Total CHOL/HDL Ratio 2    NonHDL 93.86   Hemoglobin A1c     Status: None   Collection Time: 06/30/15 10:16 AM  Result Value Ref Range   Hgb A1c MFr Bld 6.4 4.6 - 6.5 %

## 2015-06-30 NOTE — Assessment & Plan Note (Signed)
S: Quit cold [place] April 2016. Concern for PAD issues main motivating factor.  A/P: praised patient on success and we discussed continued maintenance off cigarettes.

## 2015-06-30 NOTE — Assessment & Plan Note (Signed)
S:admits to eating too many sweets. Exercise limited by PAD A/P: fortunately a1c stable today at 6.4, but discussed cutting down on sweets and healthy eating for maintainence Lab Results  Component Value Date   HGBA1C 6.4 06/30/2015

## 2015-06-30 NOTE — Assessment & Plan Note (Signed)
S: controlle don olmesartan-amlodipine-hctz BP Readings from Last 3 Encounters:  06/30/15 138/80  05/23/15 128/60  05/05/15 138/52  A/P: continue current rx

## 2015-06-30 NOTE — Patient Instructions (Signed)
Fasting labs before you leave  No changes today  Follow up 3 months  CONGRATULATIONS ON QUITTING SMOKING! SO THRILLED FOR YOU

## 2015-07-03 ENCOUNTER — Ambulatory Visit (HOSPITAL_COMMUNITY)
Admission: RE | Admit: 2015-07-03 | Discharge: 2015-07-03 | Disposition: A | Discharge status: Home / Self Care | Payer: Medicare Other | Source: Ambulatory Visit | Attending: Cardiology | Admitting: Cardiology

## 2015-07-03 ENCOUNTER — Ambulatory Visit (HOSPITAL_BASED_OUTPATIENT_CLINIC_OR_DEPARTMENT_OTHER)
Admission: RE | Admit: 2015-07-03 | Discharge: 2015-07-03 | Disposition: A | Discharge status: Home / Self Care | Payer: Medicare Other | Source: Ambulatory Visit | Attending: Cardiovascular Disease | Admitting: Cardiovascular Disease

## 2015-07-03 DIAGNOSIS — I714 Abdominal aortic aneurysm, without rupture, unspecified: Secondary | ICD-10-CM

## 2015-07-03 DIAGNOSIS — I739 Peripheral vascular disease, unspecified: Secondary | ICD-10-CM | POA: Insufficient documentation

## 2015-07-06 ENCOUNTER — Encounter: Payer: Self-pay | Admitting: *Deleted

## 2015-08-10 ENCOUNTER — Other Ambulatory Visit: Payer: Self-pay | Admitting: Family Medicine

## 2015-08-16 ENCOUNTER — Emergency Department (INDEPENDENT_AMBULATORY_CARE_PROVIDER_SITE_OTHER)
Admission: EM | Admit: 2015-08-16 | Discharge: 2015-08-16 | Disposition: A | Discharge status: Home / Self Care | Payer: Medicare Other | Source: Home / Self Care | Attending: Emergency Medicine | Admitting: Emergency Medicine

## 2015-08-16 ENCOUNTER — Encounter (HOSPITAL_COMMUNITY): Payer: Self-pay | Admitting: Emergency Medicine

## 2015-08-16 DIAGNOSIS — L089 Local infection of the skin and subcutaneous tissue, unspecified: Secondary | ICD-10-CM | POA: Diagnosis not present

## 2015-08-16 DIAGNOSIS — W57XXXA Bitten or stung by nonvenomous insect and other nonvenomous arthropods, initial encounter: Secondary | ICD-10-CM | POA: Diagnosis not present

## 2015-08-16 DIAGNOSIS — T148 Other injury of unspecified body region: Secondary | ICD-10-CM | POA: Diagnosis not present

## 2015-08-16 MED ORDER — TRIAMCINOLONE ACETONIDE 0.5 % EX OINT: 1.0000 "application " | TOPICAL_OINTMENT | Freq: Two times a day (BID) | CUTANEOUS | Status: DC | PRN

## 2015-08-16 MED ORDER — CEFDINIR 300 MG PO CAPS: 300.0000 mg | ORAL_CAPSULE | Freq: Two times a day (BID) | ORAL | Status: DC

## 2015-08-16 NOTE — Discharge Instructions (Signed)
I am concerned that the bug bite is getting infected. Please take Omnicef twice a day for the next week. Use the triamcinolone ointment twice a day as needed for itching. Please follow-up with your primary care doctor on Friday for a recheck.

## 2015-08-16 NOTE — ED Provider Notes (Signed)
CSN: [id number]     Arrival date & time 08/16/15  1302 History   First MD Initiated Contact with Patient 08/16/15 1313     Chief Complaint  Patient presents with  . Insect Bite   (Consider location/radiation/quality/duration/timing/severity/associated sxs/prior Treatment) HPI  She is an 81 year old woman here for evaluation of insect bite. She states that one week ago she was outside in the evening and got several mosquito bites to her right lower extremity. She states her whole ankle was swollen and itchy. Overall, it has improved. She still has an area on the medial aspect of the leg that is very itchy and quite red. She has had clear drainage from an old scar in that area. No fevers or chills. She has been applying over-the-counter hydrocortisone cream with temporary improvement of itching.  Past Medical History  Diagnosis Date  . Thyroid disease   . Restless leg syndrome   . Osteoporosis   . Constipation   . Anemia   . Hypertension   . Cellulitis of right lower extremity 11/27/2013  . Peripheral arterial disease   . GERD (gastroesophageal reflux disease)   . Arthritis     "shoulders" (05/04/2015)  . Chronic lower back pain    Past Surgical History  Procedure Laterality Date  . Appendectomy    . Tonsillectomy    . Cataract extraction w/ intraocular lens  implant, bilateral Bilateral   . Peripheral vascular catheterization N/A 05/04/2015    Procedure: Lower Extremity Angiography;  Surgeon: Runell Gess, MD;  [facility] INVAS[facility] CV LAB;  Service: Cardiovascular;  Laterality: N/A;  . Abdominal aortagram  05/04/2015    Procedure: Abdominal Aortagram;  Surgeon: Runell Gess, MD;  Location: [facility] INVASIVE CV LAB;  Service: Cardiovascular;;  . Peripheral vascular catheterization  05/04/2015    Procedure: Peripheral Vascular Intervention;  Surgeon: Runell Gess, MD;  Location: [facility] INVASIVE CV LAB;  Service: Cardiovascular;;  RCIA - 7x22 ICAST  . Vaginal hysterectomy    . Thyroid  surgery Right ?2013    "had goiter taken off my neck"   Family History  Problem Relation Age of Onset  . Coronary artery disease    . Heart disease Father    Social History  Substance Use Topics  . Smoking status: Former Smoker -- 0.50 packs/day for 60 years    Types: Cigarettes    Quit date: 04/02/2015  . Smokeless tobacco: Never Used  . Alcohol Use: No   OB History    No data available     Review of Systems As in history of present illness Allergies  Codeine  Home Medications   Prior to Admission medications   Medication Sig Start Date End Date Taking? Authorizing Provider  aspirin EC 81 MG EC tablet Take 1 tablet (81 mg total) by mouth daily. 05/05/15   Abelino Derrick, PA-C  atorvastatin (LIPITOR) 40 MG tablet Take 1 tablet (40 mg total) by mouth daily at 6 PM. 05/05/15   Abelino Derrick, PA-C  cefdinir (OMNICEF) 300 MG capsule Take 1 capsule (300 mg total) by mouth 2 (two) times daily. 08/16/15   Charm Rings, MD  clopidogrel (PLAVIX) 75 MG tablet Take 1 tablet (75 mg total) by mouth daily with breakfast. 05/05/15   Abelino Derrick, PA-C  NEUPRO 2 MG/24HR APPLY 1 PATCH ONTO THE SKIN DAILY 08/10/15   Shelva Majestic, MD  polyethylene glycol [facility] / GLYCOLAX) packet Take 17 g by mouth at bedtime as needed for mild  constipation.    Historical Provider, MD  traMADol (ULTRAM) 50 MG tablet Take 50 mg by mouth at bedtime as needed for moderate pain.    Historical Provider, MD  traMADol (ULTRAM-ER) 300 MG 24 hr tablet Take 300 mg by mouth daily.    Historical Provider, MD  triamcinolone ointment (KENALOG) 0.5 % Apply 1 application topically 2 (two) times daily as needed. itching 08/16/15   Charm Rings, MD  TRIBENZOR 40-10-12.5 MG TABS TAKE 1 TABLET DAILY 05/15/15   Shelva Majestic, MD   Meds Ordered and Administered this Visit  Medications - No data to display  BP 135/58 mmHg  Pulse 76  Temp(Src) 97.6 F (36.4 C) (Oral)  Resp 16  SpO2 97% No data found.   Physical Exam   Constitutional: She is oriented to person, place, and time. She appears well-developed and well-nourished. No distress.  Cardiovascular: Normal rate.   Pulmonary/Chest: Effort normal.  Neurological: She is alert and oriented to person, place, and time.  Skin:  3 x 5 cm area of erythema on right medial lower leg. Mild warmth. No active drainage.    ED Course  Procedures (including critical care time)  Labs Review Labs Reviewed - No data to display  Imaging Review No results found.   MDM   1. Bug bite with infection    Concern for developing cellulitis after bug bites. Will treat with Omnicef for 7 days. Triamcinolone ointment twice a day as needed for itching. Recommended follow-up with primary care doctor in 2 days for recheck.    Charm Rings, MD 08/16/15 1336

## 2015-08-16 NOTE — ED Notes (Signed)
C/o mosquito bites on right leg by her ankle States area itches States she was outside enjoying the evening

## 2015-08-23 ENCOUNTER — Encounter: Payer: Self-pay | Admitting: Cardiovascular Disease

## 2015-08-23 ENCOUNTER — Ambulatory Visit (INDEPENDENT_AMBULATORY_CARE_PROVIDER_SITE_OTHER): Payer: Medicare Other | Admitting: Cardiovascular Disease

## 2015-08-23 VITALS — BP 104/52 | HR 67 | Ht 62.0 in | Wt 157.5 lb

## 2015-08-23 DIAGNOSIS — Z01812 Encounter for preprocedural laboratory examination: Secondary | ICD-10-CM | POA: Diagnosis not present

## 2015-08-23 DIAGNOSIS — I739 Peripheral vascular disease, unspecified: Secondary | ICD-10-CM

## 2015-08-23 DIAGNOSIS — Z720 Tobacco use: Secondary | ICD-10-CM

## 2015-08-23 DIAGNOSIS — R0989 Other specified symptoms and signs involving the circulatory and respiratory systems: Secondary | ICD-10-CM | POA: Diagnosis not present

## 2015-08-23 DIAGNOSIS — E785 Hyperlipidemia, unspecified: Secondary | ICD-10-CM

## 2015-08-23 DIAGNOSIS — I10 Essential (primary) hypertension: Secondary | ICD-10-CM

## 2015-08-23 NOTE — Assessment & Plan Note (Signed)
History of hyperlipidemia on atorvastatin 40 mg a day with recent lipid profile performed 06/30/15 revealing a total cholesterol 173, LDL 81 HDL of 79

## 2015-08-23 NOTE — Assessment & Plan Note (Signed)
[name] returns for follow-up. I stented her right common iliac artery ostium on 05/04/15 with an ICast covered stent. She has a small abdominal aortic aneurysm, occluded left renal artery and total SFAs bilaterally. Her right lower extreme claudication somewhat improved. Her right ankle brachial index increased from 0.6 up to 0.72 although her right toe brachial index is .4 making surgery on her right foot still contraindicated with regards to healing.she also continues to complain of lifestyle limiting claudication. Based on this I decided to proceed with right SFA revascularization to allow her to have surgery on her right foot with staged left SFA intervention should she enjoy clinical improvement.

## 2015-08-23 NOTE — Assessment & Plan Note (Signed)
History of hypertension blood pressure measured at 104/52. She is on Olmasartan, amlodipine and hydrochlorothiazide. Continue current meds at current dosing

## 2015-08-23 NOTE — Patient Instructions (Addendum)
Medication Instructions:  Your physician recommends that you continue on your current medications as directed. Please refer to the Current Medication list given to you today.   Labwork: Your physician recommends that you return for lab work in: WITHIN A WEEK BEFORE YOUR PROCEDURE The lab can be found on the FIRST FLOOR of out building in [address]   Testing/Procedures: Dr. Allyson Sabal has ordered a peripheral angiogram to be done at [facility].  This procedure is going to look at the bloodflow in your lower extremities.  If Dr. Allyson Sabal is able to open up the arteries, you will have to spend one night in the hospital.  If he is not able to open the arteries, you will be able to go home that same day.    After the procedure, you will not be allowed to drive for 3 days or push, pull, or lift anything greater than 10 lbs for one week.    You will be required to have the following tests prior to the procedure:  1. Blood work-the blood work can be done no more than 7 days prior to the procedure.  It can be done at any [facility] lab.  There is one downstairs on the first floor of this building and one in the [facility] building [id number] [address])  *REPS: Scott  Puncture site Left Groin     Any Other Special Instructions Will Be Listed Below (If Applicable).

## 2015-08-23 NOTE — Progress Notes (Signed)
   08/23/2015 Cheryl Atkinson   06/12/1931  [id number]  Primary Physician HUNTER, STEPHEN, MD Primary Cardiologist: Jonathan J. Berry MD FACP,FACC,FAHA, FSCAI   HPI:  Cheryl Atkinson is an 81-year-old mildly overweight widowed Caucasian female mother of 2 children, grandmother and one grandchildren referred by Dr. Matt Wagner, podiatrist, for evaluation treatment of peripheral arterial disease. Her primary care physician is Dr. Steven Hunter. I last saw her in the office 05/23/15. She is a retired machine operator. She smoked 30 pack years and quit one month ago. Her only other risk factor is hypertension and family history. Her father died of a myocardial infarction in his early 60s. She has never had a heart attack or stroke. She denies chest pain or shortness of breath. She does have bilateral lifestyle limiting claudication. Because of lifestyle limiting claudication and the need to operate on her right foot she underwent angiography by myself on 05/04/15 revealing a hemodynamically significant ostial right common iliac artery stenosis which I stented, a small abdominal aortic aneurysm measuring  +2.4 cm, an occluded right renal artery and bilateral SFA occlusion. Her right lotion to claudication somewhat improved. Her right toe brachial index is 0.4. Clubbing surgery. We've talked about endovascular revascularization of her right SFA to allow her to have her foot surgery and improve her claudication symptoms persist H left SFA intervention.   Current Outpatient Prescriptions  Medication Sig Dispense Refill  . aspirin EC 81 MG EC tablet Take 1 tablet (81 mg total) by mouth daily.    . atorvastatin (LIPITOR) 40 MG tablet Take 1 tablet (40 mg total) by mouth daily at 6 PM. 90 tablet 3  . cefdinir (OMNICEF) 300 MG capsule Take 1 capsule (300 mg total) by mouth 2 (two) times daily. 14 capsule 0  . clopidogrel (PLAVIX) 75 MG tablet Take 1 tablet (75 mg total) by mouth daily with breakfast. 90 tablet 3    . NEUPRO 2 MG/24HR APPLY 1 PATCH ONTO THE SKIN DAILY 90 patch 0  . Olmesartan-Amlodipine-HCTZ (TRIBENZOR) 40-10-12.5 MG TABS Take 1 tablet by mouth daily.    . polyethylene glycol (MIRALAX / GLYCOLAX) packet Take 17 g by mouth at bedtime as needed for mild constipation.    . traMADol (ULTRAM) 50 MG tablet Take 50 mg by mouth at bedtime as needed for moderate pain.    . traMADol (ULTRAM-ER) 300 MG 24 hr tablet Take 300 mg by mouth daily.    . triamcinolone ointment (KENALOG) 0.5 % Apply 1 application topically 2 (two) times daily as needed. itching 30 g 0   No current facility-administered medications for this visit.    Allergies  Allergen Reactions  . Codeine Other (See Comments)    REACTION: Syncope     Social History   Social History  . Marital Status: Widowed    Spouse Name: N/A  . Number of Children: N/A  . Years of Education: N/A   Occupational History  . Not on file.   Social History Main Topics  . Smoking status: Former Smoker -- 0.50 packs/day for 60 years    Types: Cigarettes    Quit date: 04/02/2015  . Smokeless tobacco: Never Used  . Alcohol Use: No  . Drug Use: No  . Sexual Activity: No   Other Topics Concern  . Not on file   Social History Narrative   Widowed 2013. 2 sons. 1 grandchild.       Retired from [employer].       Hobbies: time   at home and with family      HCPOA: sister-in-law and brother. Stephen Sumner.       DNR/DNI      Regular exercise: none   Caffeine use: cup of coffee      Review of Systems: General: negative for chills, fever, night sweats or weight changes.  Cardiovascular: negative for chest pain, dyspnea on exertion, edema, orthopnea, palpitations, paroxysmal nocturnal dyspnea or shortness of breath Dermatological: negative for rash Respiratory: negative for cough or wheezing Urologic: negative for hematuria Abdominal: negative for nausea, vomiting, diarrhea, bright red blood per rectum, melena, or  hematemesis Neurologic: negative for visual changes, syncope, or dizziness All other systems reviewed and are otherwise negative except as noted above.    Blood pressure 104/52, pulse 67, height 5' 2" (1.575 m), weight 157 lb 8 oz (71.442 kg).  General appearance: alert and no distress Neck: no adenopathy, no carotid bruit, no JVD, supple, symmetrical, trachea midline and thyroid not enlarged, symmetric, no tenderness/mass/nodules Lungs: clear to auscultation bilaterally Heart: regular rate and rhythm, S1, S2 normal, no murmur, click, rub or gallop Extremities: extremities normal, atraumatic, no cyanosis or edema Pulses: absent pedal pulses Skin: Skin color, texture, turgor normal. No rashes or lesions Neurologic: Grossly normal  EKG not performed today  ASSESSMENT AND PLAN:   Claudication Cheryl Atkinson returns for follow-up. I stented her right common iliac artery ostium on 05/04/15 with an ICast covered stent. She has a small abdominal aortic aneurysm, occluded left renal artery and total SFAs bilaterally. Her right lower extreme claudication somewhat improved. Her right ankle brachial index increased from 0.6 up to 0.72 although her right toe brachial index is .4 making surgery on her right foot still contraindicated with regards to healing.she also continues to complain of lifestyle limiting claudication. Based on this I decided to proceed with right SFA revascularization to allow her to have surgery on her right foot with staged left SFA intervention should she enjoy clinical improvement.  Hyperlipidemia History of hyperlipidemia on atorvastatin 40 mg a day with recent lipid profile performed 06/30/15 revealing a total cholesterol 173, LDL 81 HDL of 79  Essential hypertension History of hypertension blood pressure measured at 104/52. She is on Olmasartan, amlodipine and hydrochlorothiazide. Continue current meds at current dosing      Jonathan J. Berry MD FACP,FACC,FAHA,  FSCAI 08/23/2015 9:47 AM  

## 2015-08-25 ENCOUNTER — Other Ambulatory Visit: Payer: Self-pay

## 2015-08-25 DIAGNOSIS — I739 Peripheral vascular disease, unspecified: Secondary | ICD-10-CM

## 2015-08-28 ENCOUNTER — Telehealth: Payer: Self-pay | Admitting: Cardiovascular Disease

## 2015-08-28 DIAGNOSIS — I739 Peripheral vascular disease, unspecified: Secondary | ICD-10-CM | POA: Diagnosis not present

## 2015-08-28 DIAGNOSIS — R0989 Other specified symptoms and signs involving the circulatory and respiratory systems: Secondary | ICD-10-CM | POA: Diagnosis not present

## 2015-08-28 DIAGNOSIS — Z01812 Encounter for preprocedural laboratory examination: Secondary | ICD-10-CM | POA: Diagnosis not present

## 2015-08-28 DIAGNOSIS — Z720 Tobacco use: Secondary | ICD-10-CM | POA: Diagnosis not present

## 2015-08-28 LAB — PROTIME-INR
INR: 1.06 (ref <1.50)
Prothrombin Time: 13.9 seconds (ref 11.6–15.2)

## 2015-08-28 LAB — APTT: aPTT: 29 seconds (ref 24–37)

## 2015-08-28 NOTE — Telephone Encounter (Signed)
Spoke w/ Shirlene, CXR done in May. Noted patient instructed to get pre-cath labwork, but no instructions to do repeat CXR prior to cath. She voiced understanding.

## 2015-08-28 NOTE — Telephone Encounter (Signed)
Ruby is calling in b/c the pt is in the office to have a chest x-ray done but Dr. Allyson Sabal did not put in the order

## 2015-08-29 LAB — CBC WITH DIFFERENTIAL/PLATELET
Basophils Absolute: 0.1 10*3/uL (ref 0.0–0.1)
Basophils Relative: 1 % (ref 0–1)
Eosinophils Absolute: 0.4 10*3/uL (ref 0.0–0.7)
Eosinophils Relative: 5 % (ref 0–5)
HCT: 34.6 % — ABNORMAL LOW (ref 36.0–46.0)
Hemoglobin: 11.1 g/dL — ABNORMAL LOW (ref 12.0–15.0)
Lymphocytes Relative: 27 % (ref 12–46)
Lymphs Abs: 2.3 10*3/uL (ref 0.7–4.0)
MCH: 27.7 pg (ref 26.0–34.0)
MCHC: 32.1 g/dL (ref 30.0–36.0)
MCV: 86.3 fL (ref 78.0–100.0)
MPV: 10.5 fL (ref 8.6–12.4)
Monocytes Absolute: 0.5 10*3/uL (ref 0.1–1.0)
Monocytes Relative: 6 % (ref 3–12)
Neutro Abs: 5.2 10*3/uL (ref 1.7–7.7)
Neutrophils Relative %: 61 % (ref 43–77)
Platelets: 362 10*3/uL (ref 150–400)
RBC: 4.01 MIL/uL (ref 3.87–5.11)
RDW: 14.4 % (ref 11.5–15.5)
WBC: 8.5 10*3/uL (ref 4.0–10.5)

## 2015-08-29 LAB — BASIC METABOLIC PANEL
BUN: 18 mg/dL (ref 7–25)
CO2: 28 mmol/L (ref 20–31)
Calcium: 8.9 mg/dL (ref 8.6–10.4)
Chloride: 100 mmol/L (ref 98–110)
Creat: 0.76 mg/dL (ref 0.60–0.88)
Glucose, Bld: 102 mg/dL — ABNORMAL HIGH (ref 65–99)
Potassium: 3.9 mmol/L (ref 3.5–5.3)
Sodium: 138 mmol/L (ref 135–146)

## 2015-09-04 ENCOUNTER — Encounter (HOSPITAL_COMMUNITY)
Admission: RE | Disposition: A | Discharge status: Home / Self Care | Payer: Medicare Other | Source: Ambulatory Visit | Attending: Cardiovascular Disease

## 2015-09-04 ENCOUNTER — Ambulatory Visit (HOSPITAL_COMMUNITY)
Admission: RE | Admit: 2015-09-04 | Discharge: 2015-09-05 | Disposition: A | Discharge status: Home / Self Care | Payer: Medicare Other | Source: Ambulatory Visit | Attending: Cardiovascular Disease | Admitting: Cardiovascular Disease

## 2015-09-04 ENCOUNTER — Encounter (HOSPITAL_COMMUNITY): Payer: Self-pay | Admitting: General Practice

## 2015-09-04 DIAGNOSIS — D649 Anemia, unspecified: Secondary | ICD-10-CM | POA: Diagnosis not present

## 2015-09-04 DIAGNOSIS — I70211 Atherosclerosis of native arteries of extremities with intermittent claudication, right leg: Secondary | ICD-10-CM | POA: Insufficient documentation

## 2015-09-04 DIAGNOSIS — Z7982 Long term (current) use of aspirin: Secondary | ICD-10-CM | POA: Diagnosis not present

## 2015-09-04 DIAGNOSIS — Z87891 Personal history of nicotine dependence: Secondary | ICD-10-CM | POA: Diagnosis not present

## 2015-09-04 DIAGNOSIS — I739 Peripheral vascular disease, unspecified: Secondary | ICD-10-CM | POA: Diagnosis present

## 2015-09-04 DIAGNOSIS — Z7902 Long term (current) use of antithrombotics/antiplatelets: Secondary | ICD-10-CM | POA: Diagnosis not present

## 2015-09-04 DIAGNOSIS — E785 Hyperlipidemia, unspecified: Secondary | ICD-10-CM | POA: Insufficient documentation

## 2015-09-04 DIAGNOSIS — I10 Essential (primary) hypertension: Secondary | ICD-10-CM | POA: Diagnosis not present

## 2015-09-04 HISTORY — PX: OTHER SURGICAL HISTORY: SHX169

## 2015-09-04 HISTORY — PX: PERIPHERAL VASCULAR CATHETERIZATION: SHX172C

## 2015-09-04 HISTORY — DX: Personal history of other diseases of the digestive system: Z87.19

## 2015-09-04 LAB — POCT ACTIVATED CLOTTING TIME
Activated Clotting Time: 227 seconds
Activated Clotting Time: 239 seconds
Activated Clotting Time: 270 seconds
Activated Clotting Time: 171 seconds

## 2015-09-04 SURGERY — PERIPHERAL VASCULAR ATHERECTOMY
Laterality: Right

## 2015-09-04 MED ORDER — SODIUM CHLORIDE 0.9 % WEIGHT BASED INFUSION: 1.0000 mL/kg/h | INTRAVENOUS | Status: DC

## 2015-09-04 MED ORDER — CLOPIDOGREL BISULFATE 75 MG PO TABS: 75.0000 mg | ORAL_TABLET | Freq: Every day | ORAL | Status: DC

## 2015-09-04 MED ORDER — SODIUM CHLORIDE 0.9 % WEIGHT BASED INFUSION
3.0000 mL/kg/h | INTRAVENOUS | Status: DC
  Administered 2015-09-04: 3 mL/kg/h via INTRAVENOUS

## 2015-09-04 MED ORDER — POLYETHYLENE GLYCOL 3350 17 G PO PACK: 17.0000 g | PACK | Freq: Every evening | ORAL | Status: DC | PRN

## 2015-09-04 MED ORDER — MIDAZOLAM HCL 2 MG/2ML IJ SOLN
INTRAMUSCULAR | Status: DC | PRN
  Administered 2015-09-04: 1 mg via INTRAVENOUS

## 2015-09-04 MED ORDER — HEPARIN SODIUM (PORCINE) 1000 UNIT/ML IJ SOLN
INTRAMUSCULAR | Status: DC | PRN
  Administered 2015-09-04 (×2): 2500 [IU] via INTRAVENOUS
  Administered 2015-09-04: 5000 [IU] via INTRAVENOUS

## 2015-09-04 MED ORDER — ASPIRIN 81 MG PO CHEW
81.0000 mg | CHEWABLE_TABLET | ORAL | Status: AC
  Administered 2015-09-04: 81 mg via ORAL

## 2015-09-04 MED ORDER — SODIUM CHLORIDE 0.9 % IJ SOLN: 3.0000 mL | INTRAMUSCULAR | Status: DC | PRN

## 2015-09-04 MED ORDER — FENTANYL CITRATE (PF) 100 MCG/2ML IJ SOLN
INTRAMUSCULAR | Status: DC | PRN
  Administered 2015-09-04: 25 ug via INTRAVENOUS

## 2015-09-04 MED ORDER — ONDANSETRON HCL 4 MG/2ML IJ SOLN: 4.0000 mg | Freq: Four times a day (QID) | INTRAMUSCULAR | Status: DC | PRN

## 2015-09-04 MED ORDER — TRIAMCINOLONE ACETONIDE 0.5 % EX OINT
1.0000 "application " | TOPICAL_OINTMENT | Freq: Two times a day (BID) | CUTANEOUS | Status: DC
  Filled 2015-09-04: qty 15

## 2015-09-04 MED ORDER — FENTANYL CITRATE (PF) 100 MCG/2ML IJ SOLN
INTRAMUSCULAR | Status: AC
  Filled 2015-09-04: qty 4

## 2015-09-04 MED ORDER — INFLUENZA VAC SPLIT QUAD 0.5 ML IM SUSY
0.5000 mL | PREFILLED_SYRINGE | Freq: Once | INTRAMUSCULAR | Status: AC
  Administered 2015-09-05: 0.5 mL via INTRAMUSCULAR
  Filled 2015-09-04: qty 0.5

## 2015-09-04 MED ORDER — OLMESARTAN-AMLODIPINE-HCTZ 40-10-12.5 MG PO TABS: 1.0000 | ORAL_TABLET | Freq: Every day | ORAL | Status: DC

## 2015-09-04 MED ORDER — SODIUM CHLORIDE 0.9 % IV SOLN
INTRAVENOUS | Status: AC
  Administered 2015-09-04: 12:00:00 via INTRAVENOUS

## 2015-09-04 MED ORDER — ROTIGOTINE 2 MG/24HR TD PT24: 1.0000 | MEDICATED_PATCH | Freq: Every day | TRANSDERMAL | Status: DC

## 2015-09-04 MED ORDER — AMLODIPINE BESYLATE 10 MG PO TABS
10.0000 mg | ORAL_TABLET | Freq: Every day | ORAL | Status: DC
  Administered 2015-09-05: 09:00:00 10 mg via ORAL
  Filled 2015-09-04: qty 1

## 2015-09-04 MED ORDER — LIDOCAINE HCL (PF) 1 % IJ SOLN
INTRAMUSCULAR | Status: AC
  Filled 2015-09-04: qty 30

## 2015-09-04 MED ORDER — ATORVASTATIN CALCIUM 40 MG PO TABS
40.0000 mg | ORAL_TABLET | Freq: Every day | ORAL | Status: DC
  Administered 2015-09-04: 40 mg via ORAL
  Filled 2015-09-04: qty 1

## 2015-09-04 MED ORDER — TRAMADOL HCL 50 MG PO TABS
50.0000 mg | ORAL_TABLET | Freq: Every evening | ORAL | Status: DC | PRN
Start: 2015-09-04 — End: 2015-09-05

## 2015-09-04 MED ORDER — FENTANYL CITRATE (PF) 100 MCG/2ML IJ SOLN
25.0000 ug | Freq: Once | INTRAMUSCULAR | Status: AC
  Administered 2015-09-04: 25 ug via INTRAVENOUS
  Filled 2015-09-04: qty 2

## 2015-09-04 MED ORDER — ASPIRIN EC 81 MG PO TBEC: 81.0000 mg | DELAYED_RELEASE_TABLET | Freq: Every day | ORAL | Status: DC

## 2015-09-04 MED ORDER — CLOPIDOGREL BISULFATE 75 MG PO TABS
75.0000 mg | ORAL_TABLET | Freq: Every day | ORAL | Status: DC
  Administered 2015-09-05: 75 mg via ORAL
  Filled 2015-09-04: qty 1

## 2015-09-04 MED ORDER — HEPARIN (PORCINE) IN NACL 2-0.9 UNIT/ML-% IJ SOLN
INTRAMUSCULAR | Status: AC
  Filled 2015-09-04: qty 1000

## 2015-09-04 MED ORDER — MIDAZOLAM HCL 2 MG/2ML IJ SOLN
INTRAMUSCULAR | Status: AC
  Filled 2015-09-04: qty 4

## 2015-09-04 MED ORDER — ACETAMINOPHEN 325 MG PO TABS: 650.0000 mg | ORAL_TABLET | ORAL | Status: DC | PRN

## 2015-09-04 MED ORDER — ASPIRIN 81 MG PO CHEW
CHEWABLE_TABLET | ORAL | Status: AC
  Administered 2015-09-04: 81 mg via ORAL
  Filled 2015-09-04: qty 1

## 2015-09-04 MED ORDER — HEPARIN SODIUM (PORCINE) 1000 UNIT/ML IJ SOLN
INTRAMUSCULAR | Status: AC
  Filled 2015-09-04: qty 1

## 2015-09-04 MED ORDER — ASPIRIN EC 325 MG PO TBEC
325.0000 mg | DELAYED_RELEASE_TABLET | Freq: Every day | ORAL | Status: DC
  Administered 2015-09-05: 325 mg via ORAL
  Filled 2015-09-04: qty 1

## 2015-09-04 MED ORDER — TRAMADOL HCL 50 MG PO TABS
100.0000 mg | ORAL_TABLET | Freq: Three times a day (TID) | ORAL | Status: DC
  Administered 2015-09-05: 100 mg via ORAL
  Filled 2015-09-04 (×4): qty 2

## 2015-09-04 MED ORDER — HYDROCHLOROTHIAZIDE 12.5 MG PO CAPS
12.5000 mg | ORAL_CAPSULE | Freq: Every day | ORAL | Status: DC
  Administered 2015-09-05: 09:00:00 12.5 mg via ORAL
  Filled 2015-09-04: qty 1

## 2015-09-04 MED ORDER — IRBESARTAN 300 MG PO TABS
300.0000 mg | ORAL_TABLET | Freq: Every day | ORAL | Status: DC
  Administered 2015-09-05: 09:00:00 300 mg via ORAL
  Filled 2015-09-04: qty 1

## 2015-09-04 SURGICAL SUPPLY — 25 items
BALLN ARMADA 1.5X20X150 (CATHETERS) ×3
BALLN LUTONIX 5X120X130 (BALLOONS) ×3
BALLOON ARMADA 1.5X20X150 (CATHETERS) ×1 IMPLANT
BALLOON LUTONIX 5X120X130 (BALLOONS) ×1 IMPLANT
BALLOON SABER 2.5X60X150 (BALLOONS) ×2 IMPLANT
CATH CROSS OVER TEMPO 5F (CATHETERS) ×2 IMPLANT
CATH CXI SUPP ANG 2.6FR 150CM (MICROCATHETER) ×2 IMPLANT
CATH HAWKONE LX EXTENDED TIP (CATHETERS) ×2 IMPLANT
CATH VIANCE CROSS STAND 150CM (MICROCATHETER) ×3
CATH VIANCE CROSS STD 150CM (MICROCATHETER) ×1 IMPLANT
DEVICE CONTINUOUS FLUSH (MISCELLANEOUS) ×2 IMPLANT
DEVICE SPIDERFX EMB PROT 6MM (WIRE) ×2 IMPLANT
GUIDEWIRE ASTATO XS 20G 300CM (WIRE) ×2 IMPLANT
KIT ENCORE 26 ADVANTAGE (KITS) ×2 IMPLANT
KIT PV (KITS) ×3 IMPLANT
SHEATH HIGHFLEX ANSEL 7FR 55CM (SHEATH) ×2 IMPLANT
SHEATH PINNACLE 5F 10CM (SHEATH) ×2 IMPLANT
SHEATH PINNACLE 7F 10CM (SHEATH) ×2 IMPLANT
SYR MEDRAD MARK V 150ML (SYRINGE) ×3 IMPLANT
TAPE RADIOPAQUE TURBO (MISCELLANEOUS) ×2 IMPLANT
TRANSDUCER W/STOPCOCK (MISCELLANEOUS) ×3 IMPLANT
TRAY PV CATH (CUSTOM PROCEDURE TRAY) ×3 IMPLANT
WIRE HITORQ VERSACORE ST 145CM (WIRE) ×2 IMPLANT
WIRE ROSEN-J .035X180CM (WIRE) ×2 IMPLANT
WIRE SPARTACORE .014X300CM (WIRE) ×2 IMPLANT

## 2015-09-04 NOTE — Progress Notes (Signed)
Site area: left groin  Site Prior to Removal:  Level 0  Pressure Applied For 40 MINUTES    Minutes Beginning at 1445  Manual:   Yes.    Patient Status During Pull:  stable  Post Pull Groin Site:  Level 0  Post Pull Instructions Given:  Yes.    Post Pull Pulses Present:  Yes.    Dressing Applied:  Yes.    Comments:  Held additional time to achieve hemostasis

## 2015-09-04 NOTE — H&P (View-Only) (Signed)
08/23/2015 Cheryl Atkinson   09/06/1931  [id number]  Primary Physician Tana Conch, MD Primary Cardiologist: Runell Gess MD Roseanne Reno   HPI:  Cheryl Atkinson is an 81 year old mildly overweight widowed Caucasian female mother of 2 children, grandmother and one grandchildren referred by Dr. Shirlean Kelly, podiatrist, for evaluation treatment of peripheral arterial disease. Her primary care physician is Dr. Annice Pih. I last saw her in the office 05/23/15. She is a retired Location manager. She smoked 30 pack years and quit one month ago. Her only other risk factor is hypertension and family history. Her father died of a myocardial infarction in his early 71s. She has never had a heart attack or stroke. She denies chest pain or shortness of breath. She does have bilateral lifestyle limiting claudication. Because of lifestyle limiting claudication and the need to operate on her right foot she underwent angiography by myself on 05/04/15 revealing a hemodynamically significant ostial right common iliac artery stenosis which I stented, a small abdominal aortic aneurysm measuring  +2.4 cm, an occluded right renal artery and bilateral SFA occlusion. Her right lotion to claudication somewhat improved. Her right toe brachial index is 0.4. Clubbing surgery. We've talked about endovascular revascularization of her right SFA to allow her to have her foot surgery and improve her claudication symptoms persist H left SFA intervention.   Current Outpatient Prescriptions  Medication Sig Dispense Refill  . aspirin EC 81 MG EC tablet Take 1 tablet (81 mg total) by mouth daily.    Marland Kitchen atorvastatin (LIPITOR) 40 MG tablet Take 1 tablet (40 mg total) by mouth daily at 6 PM. 90 tablet 3  . cefdinir (OMNICEF) 300 MG capsule Take 1 capsule (300 mg total) by mouth 2 (two) times daily. 14 capsule 0  . clopidogrel (PLAVIX) 75 MG tablet Take 1 tablet (75 mg total) by mouth daily with breakfast. 90 tablet 3    . NEUPRO 2 MG/24HR APPLY 1 PATCH ONTO THE SKIN DAILY 90 patch 0  . Olmesartan-Amlodipine-HCTZ (TRIBENZOR) 40-10-12.5 MG TABS Take 1 tablet by mouth daily.    . polyethylene glycol (MIRALAX / GLYCOLAX) packet Take 17 g by mouth at bedtime as needed for mild constipation.    . traMADol (ULTRAM) 50 MG tablet Take 50 mg by mouth at bedtime as needed for moderate pain.    . traMADol (ULTRAM-ER) 300 MG 24 hr tablet Take 300 mg by mouth daily.    Marland Kitchen triamcinolone ointment (KENALOG) 0.5 % Apply 1 application topically 2 (two) times daily as needed. itching 30 g 0   No current facility-administered medications for this visit.    Allergies  Allergen Reactions  . Codeine Other (See Comments)    REACTION: Syncope     Social History   Social History  . Marital Status: Widowed    Spouse Name: N/A  . Number of Children: N/A  . Years of Education: N/A   Occupational History  . Not on file.   Social History Main Topics  . Smoking status: Former Smoker -- 0.50 packs/day for 60 years    Types: Cigarettes    Quit date: 04/02/2015  . Smokeless tobacco: Never Used  . Alcohol Use: No  . Drug Use: No  . Sexual Activity: No   Other Topics Concern  . Not on file   Social History Narrative   Widowed 2013. 2 sons. 1 grandchild.       Retired from [employer].       Hobbies: time  at home and with family      HCPOA: sister-in-law and brother. Deirdre Evener.       DNR/DNI      Regular exercise: none   Caffeine use: cup of coffee      Review of Systems: General: negative for chills, fever, night sweats or weight changes.  Cardiovascular: negative for chest pain, dyspnea on exertion, edema, orthopnea, palpitations, paroxysmal nocturnal dyspnea or shortness of breath Dermatological: negative for rash Respiratory: negative for cough or wheezing Urologic: negative for hematuria Abdominal: negative for nausea, vomiting, diarrhea, bright red blood per rectum, melena, or  hematemesis Neurologic: negative for visual changes, syncope, or dizziness All other systems reviewed and are otherwise negative except as noted above.    Blood pressure 104/52, pulse 67, height  (1.575 m), weight 157 lb 8 oz (71.442 kg).  General appearance: alert and no distress Neck: no adenopathy, no carotid bruit, no JVD, supple, symmetrical, trachea midline and thyroid not enlarged, symmetric, no tenderness/mass/nodules Lungs: clear to auscultation bilaterally Heart: regular rate and rhythm, S1, S2 normal, no murmur, click, rub or gallop Extremities: extremities normal, atraumatic, no cyanosis or edema Pulses: absent pedal pulses Skin: Skin color, texture, turgor normal. No rashes or lesions Neurologic: Grossly normal  EKG not performed today  ASSESSMENT AND PLAN:   Claudication [name] returns for follow-up. I stented her right common iliac artery ostium on 05/04/15 with an ICast covered stent. She has a small abdominal aortic aneurysm, occluded left renal artery and total SFAs bilaterally. Her right lower extreme claudication somewhat improved. Her right ankle brachial index increased from 0.6 up to 0.72 although her right toe brachial index is .4 making surgery on her right foot still contraindicated with regards to healing.she also continues to complain of lifestyle limiting claudication. Based on this I decided to proceed with right SFA revascularization to allow her to have surgery on her right foot with staged left SFA intervention should she enjoy clinical improvement.  Hyperlipidemia History of hyperlipidemia on atorvastatin 40 mg a day with recent lipid profile performed 06/30/15 revealing a total cholesterol 173, LDL 81 HDL of 79  Essential hypertension History of hypertension blood pressure measured at 104/52. She is on Olmasartan, amlodipine and hydrochlorothiazide. Continue current meds at current dosing      Runell Gess MD [facility],  [facility] 08/23/2015 9:47 AM

## 2015-09-04 NOTE — Interval H&P Note (Signed)
History and Physical Interval Note:  09/04/2015 9:14 AM  Cheryl Atkinson  has presented today for surgery, with the diagnosis of pad  The various methods of treatment have been discussed with the patient and family. After consideration of risks, benefits and other options for treatment, the patient has consented to  Procedure(s): Lower Extremity Angiography (N/A) as a surgical intervention .  The patient's history has been reviewed, patient examined, no change in status, stable for surgery.  I have reviewed the patient's chart and labs.  Questions were answered to the patient's satisfaction.     [name]

## 2015-09-04 NOTE — Progress Notes (Signed)
Ordered once time dose of fentanyl for sheath pull, pt has allergy to codeine with syncope. Per nursing staff, discussed with pharmacist, recommended fentanyl for alternative pain control  Signed, Azalee Course PA Pager: [phone]

## 2015-09-05 ENCOUNTER — Encounter (HOSPITAL_COMMUNITY): Payer: Self-pay | Admitting: Cardiovascular Disease

## 2015-09-05 ENCOUNTER — Other Ambulatory Visit: Payer: Self-pay | Admitting: Physician Assistant

## 2015-09-05 DIAGNOSIS — I70211 Atherosclerosis of native arteries of extremities with intermittent claudication, right leg: Secondary | ICD-10-CM | POA: Diagnosis not present

## 2015-09-05 DIAGNOSIS — D62 Acute posthemorrhagic anemia: Secondary | ICD-10-CM

## 2015-09-05 DIAGNOSIS — Z7982 Long term (current) use of aspirin: Secondary | ICD-10-CM | POA: Diagnosis not present

## 2015-09-05 DIAGNOSIS — E785 Hyperlipidemia, unspecified: Secondary | ICD-10-CM | POA: Diagnosis not present

## 2015-09-05 DIAGNOSIS — I739 Peripheral vascular disease, unspecified: Secondary | ICD-10-CM | POA: Diagnosis not present

## 2015-09-05 DIAGNOSIS — Z7902 Long term (current) use of antithrombotics/antiplatelets: Secondary | ICD-10-CM | POA: Diagnosis not present

## 2015-09-05 DIAGNOSIS — Z87891 Personal history of nicotine dependence: Secondary | ICD-10-CM | POA: Diagnosis not present

## 2015-09-05 DIAGNOSIS — I10 Essential (primary) hypertension: Secondary | ICD-10-CM | POA: Diagnosis not present

## 2015-09-05 LAB — BASIC METABOLIC PANEL
Anion gap: 6 (ref 5–15)
BUN: 14 mg/dL (ref 6–20)
CO2: 26 mmol/L (ref 22–32)
Calcium: 8.2 mg/dL — ABNORMAL LOW (ref 8.9–10.3)
Chloride: 103 mmol/L (ref 101–111)
Creatinine, Ser: 0.81 mg/dL (ref 0.44–1.00)
GFR calc Af Amer: >60 mL/min (ref >60)
GFR calc non Af Amer: >60 mL/min (ref >60)
Glucose, Bld: 110 mg/dL — ABNORMAL HIGH (ref 65–99)
Potassium: 4 mmol/L (ref 3.5–5.1)
Sodium: 135 mmol/L (ref 135–145)

## 2015-09-05 LAB — CBC
HCT: 27.4 % — ABNORMAL LOW (ref 36.0–46.0)
Hemoglobin: 8.9 g/dL — ABNORMAL LOW (ref 12.0–15.0)
MCH: 28.5 pg (ref 26.0–34.0)
MCHC: 32.5 g/dL (ref 30.0–36.0)
MCV: 87.8 fL (ref 78.0–100.0)
Platelets: 244 10*3/uL (ref 150–400)
RBC: 3.12 MIL/uL — ABNORMAL LOW (ref 3.87–5.11)
RDW: 14.2 % (ref 11.5–15.5)
WBC: 8 10*3/uL (ref 4.0–10.5)

## 2015-09-05 MED FILL — Lidocaine HCl Local Preservative Free (PF) Inj 1%: INTRAMUSCULAR | Qty: 30 | Status: AC

## 2015-09-05 MED FILL — Heparin Sodium (Porcine) 2 Unit/ML in Sodium Chloride 0.9%: INTRAMUSCULAR | Qty: 1000 | Status: AC

## 2015-09-05 NOTE — Progress Notes (Signed)
Patient Name: Cheryl Atkinson Date of Encounter: 09/05/2015  Active Problems:   Claudication [facility])   Primary Cardiologist: Dr Allyson Sabal  Patient Profile: 81 yo female w/ hx tob use, FH MI, HTN, HL, claudication sx, admitted 10/03 for PV cath.   SUBJECTIVE: Feels well today, no known history of anemia. Denies any history of dark tarry stools. No chest pain or shortness of breath. Feels she will not start smoking again.  OBJECTIVE Filed Vitals:   09/04/15 1900 09/04/15 2000 09/04/15 2200 09/05/15 0346  BP: 121/48 104/44 110/45 101/45  Pulse:  70  67  Temp:  98.7 F (37.1 C)  98.5 F (36.9 C)  TempSrc:  Oral  Oral  Resp: Height:     (1.575 m)  Weight:    160 lb 15 oz (73 kg)  SpO2: 97% 95% 96% 96%    Intake/Output Summary (Last 24 hours) at 09/05/15 0834 Last data filed at 09/04/15 2200  Gross per 24 hour  Intake 1027.5 ml  Output    400 ml  Net  627.5 ml   Filed Weights   09/05/15 0346  Weight: 160 lb 15 oz (73 kg)    PHYSICAL EXAM General: Well developed, well nourished, female in no acute distress. Head: Normocephalic, atraumatic.  Neck: Supple without bruits, JVD not elevated. Lungs:  Resp regular and unlabored, CTA. Heart: RRR, S1, S2, no S3, S4, soft murmur; no rub. Abdomen: Soft, non-tender, non-distended, BS + x 4.  Extremities: No clubbing, cyanosis, edema. Left femoral cath site without ecchymosis or hematoma, bilateral femoral bruits noted. Lower extremity pulses are weak but intact bilaterally. Neuro: Alert and oriented X 3. Moves all extremities spontaneously. Psych: Normal affect.  LABS: CBC: Recent Labs  09/05/15 0428  WBC 8.0  HGB 8.9*  HCT 27.4*  MCV 87.8  PLT 244   Basic Metabolic Panel: Recent Labs  09/05/15 0428  NA 135  K 4.0  CL 103  CO2 26  GLUCOSE 110*  BUN 14  CREATININE 0.81  CALCIUM 8.2*   TELE: SR, no sig ectopy       Current Medications:  . irbesartan  300 mg Oral Daily   And  . amLODipine   10 mg Oral Daily   And  . hydrochlorothiazide  12.5 mg Oral Daily  . aspirin EC  325 mg Oral Daily  . atorvastatin  40 mg Oral q1800  . clopidogrel  75 mg Oral Q breakfast  . Influenza vac split quadrivalent PF  0.5 mL Intramuscular Once  . rotigotine  1 patch Transdermal Daily  . traMADol  100 mg Oral TID  . triamcinolone ointment  1 application Topical BID      ASSESSMENT AND PLAN: Active Problems:   Claudication (HCC) - Final Impression: successful Hawk 1 directional atherectomy, drug eluting balloon angioplasty of a chronic total occlusion right SFA using spider distal protection. The patient is already on dual antiplatelet therapy for prior iliac stent. The sheath will be removed once ACT falls below 170 and pressure held. Patient will be hydrated overnight and discharged home the morning. She will receive lower extremity arterial Doppler studies in our [place] office next week and I will see her back in 2-3 weeks for follow-up. She left the lab in stable condition. - Patient doing well post-procedure.    Anemia - Patient denies any history of this although her hemoglobin has been mildly low for the past year or 2. - Denies  dark or tarry stools - Will give her stool guaiac cards at discharge and have her follow-up with her primary care physician  Plan: Discharge today with outpatient follow-up per Dr. Hazle Coca instructions  Signed, Raford Pitcher, Bjorn Loser , PA-C 8:34 AM 09/05/2015  I have seen and examined the patient along with Barrett, Bjorn Loser , PA-C.  I have reviewed the chart, notes and new data.  I agree with PA's note.  Key new complaints: no complaints Key examination changes: no groin complications Key new findings / data: Hgb 8.9, normocytic normochromic  PLAN: Recheck CBC in several days and also check for Fe deficiency (note low transferrin saturation by 2012 labs). DC home today  Thurmon Fair, MD, [facility] [phone] 09/05/2015, 9:12 AM

## 2015-09-05 NOTE — Discharge Instructions (Signed)

## 2015-09-05 NOTE — Discharge Summary (Signed)
CARDIOLOGY DISCHARGE SUMMARY   Patient ID: Cheryl Atkinson MRN: [id number] DOB/AGE: Jun 02, 1931 81 y.o.  Admit date: 09/04/2015 Discharge date: 09/05/2015  PCP: Tana Conch, MD Primary Cardiologist: Dr. Allyson Sabal  Primary Discharge Diagnosis:  Claudication- s/p  Secondary Discharge Diagnosis:  Hypertension Hyperlipidemia Recent tobacco use Anemia  Procedures Performed: 1. Obtaining contralateral access from the left side to the right 2. Hawk 1 directional atherectomy moderately long chronically occluded calcified mid   right SFA segment 3. Drug eluting balloon angioplasty the mid right SFA 4. Spider distal protection device deployed right popliteal artery   Hospital Course: Cheryl Atkinson is a 81 y.o. female with a history of hypertension, hyperlipidemia and tobacco use (quit September 2016). She developed claudication symptoms and was evaluated by Dr. Mariel Aloe. Peripheral vascular catheterization was indicated and she came to the hospital for the procedure on 09/04/2015.  Vascular catheterization results are below. She had successful angioplasty of her right SFA with atherectomy, using distal protection. She tolerated the procedure well.  On 09/05/2015, she was seen by Dr. Royann Shivers and all data were reviewed. She was anemic, and had been mildly anemic in the past but this was more severe. She denied any history of dark or tarry stools and denied any awareness of a history of anemia. She is being given stool guaiac cards and will use them, then follow-up with her primary care physician. This may be entirely secondary to blood loss during the procedure, but we will check iron studies prior to her follow-up appointment in the office.   She was ambulating without chest pain or shortness of breath and her cath site was stable. She is considered stable for discharge, to follow up in the office.  Labs:   Lab Results  Component Value Date     WBC 8.0 09/05/2015   HGB 8.9* 09/05/2015   HCT 27.4* 09/05/2015   MCV 87.8 09/05/2015   PLT 244 09/05/2015     Recent Labs Lab 09/05/15 0428  NA 135  K 4.0  CL 103  CO2 26  BUN 14  CREATININE 0.81  CALCIUM 8.2*  GLUCOSE 110*    Peripheral Vascular Cath: 09/04/2015 - Final Impression: successful Hawk 1 directional atherectomy, drug eluting balloon angioplasty of a chronic total occlusion right SFA using spider distal protection. The patient is already on dual antiplatelet therapy for prior iliac stent. The sheath will be removed once ACT falls below 170 and pressure held. Patient will be hydrated overnight and discharged home the morning. She will receive lower extremity arterial Doppler studies in our [facility] office next week and I will see her back in 2-3 weeks for follow-up. She left the lab in stable condition.  FOLLOW UP PLANS AND APPOINTMENTS Allergies  Allergen Reactions  . Codeine Other (See Comments)    REACTION: Syncope      Medication List    TAKE these medications        aspirin 81 MG EC tablet  Take 1 tablet (81 mg total) by mouth daily.     atorvastatin 40 MG tablet  Commonly known as:  LIPITOR  Take 1 tablet (40 mg total) by mouth daily at 6 PM.     clopidogrel 75 MG tablet  Commonly known as:  PLAVIX  Take 1 tablet (75 mg total) by mouth daily with breakfast.     NEUPRO 2 MG/24HR  Generic drug:  rotigotine  APPLY 1 PATCH ONTO THE SKIN DAILY     polyethylene glycol packet  Commonly known as:  MIRALAX / GLYCOLAX  Take 17 g by mouth at bedtime as needed for mild constipation.     traMADol 300 MG 24 hr tablet  Commonly known as:  ULTRAM-ER  Take 300 mg by mouth daily.     traMADol 50 MG tablet  Commonly known as:  ULTRAM  Take 50 mg by mouth at bedtime as needed for moderate pain.     triamcinolone ointment 0.5 %  Commonly known as:  KENALOG  Apply 1 application topically 2 (two) times daily as needed. itching     TRIBENZOR 40-10-12.5  MG Tabs  Generic drug:  Olmesartan-Amlodipine-HCTZ  Take 1 tablet by mouth daily.        Discharge Instructions    Diet - low sodium heart healthy    Complete by:  As directed      Increase activity slowly    Complete by:  As directed           Follow-up Information    Follow up with [facility] On 09/12/2015.   Why:  Ultrasound Dopplers at 12:30 PM, please arrive 15 minutes early for paperwork.   Contact information:   [address] [place] [address] [phone]      Follow up with [name], MD.   Specialties:  Cardiology, Radiology   Why:  The office will call.   Contact information:   [address] [place] [address] [phone]       BRING ALL MEDICATIONS WITH YOU TO FOLLOW UP APPOINTMENTS  Time spent with patient to include physician time: 46 min Signed: Theodore Demark, PA-C 09/05/2015, 10:24 AM Co-Sign MD

## 2015-09-07 ENCOUNTER — Other Ambulatory Visit: Payer: Self-pay | Admitting: Cardiovascular Disease

## 2015-09-07 DIAGNOSIS — I739 Peripheral vascular disease, unspecified: Secondary | ICD-10-CM

## 2015-09-12 ENCOUNTER — Ambulatory Visit (HOSPITAL_COMMUNITY)
Admission: RE | Admit: 2015-09-12 | Discharge: 2015-09-12 | Disposition: A | Discharge status: Home / Self Care | Payer: Medicare Other | Source: Ambulatory Visit | Attending: Physician Assistant | Admitting: Physician Assistant

## 2015-09-12 DIAGNOSIS — Z95820 Peripheral vascular angioplasty status with implants and grafts: Secondary | ICD-10-CM | POA: Diagnosis not present

## 2015-09-12 DIAGNOSIS — E785 Hyperlipidemia, unspecified: Secondary | ICD-10-CM | POA: Diagnosis not present

## 2015-09-12 DIAGNOSIS — I739 Peripheral vascular disease, unspecified: Secondary | ICD-10-CM | POA: Diagnosis not present

## 2015-09-12 DIAGNOSIS — I10 Essential (primary) hypertension: Secondary | ICD-10-CM | POA: Insufficient documentation

## 2015-09-12 DIAGNOSIS — Z87891 Personal history of nicotine dependence: Secondary | ICD-10-CM | POA: Diagnosis not present

## 2015-09-12 DIAGNOSIS — I70201 Unspecified atherosclerosis of native arteries of extremities, right leg: Secondary | ICD-10-CM | POA: Insufficient documentation

## 2015-09-12 DIAGNOSIS — I700 Atherosclerosis of aorta: Secondary | ICD-10-CM | POA: Diagnosis not present

## 2015-09-12 DIAGNOSIS — J449 Chronic obstructive pulmonary disease, unspecified: Secondary | ICD-10-CM | POA: Insufficient documentation

## 2015-09-13 DIAGNOSIS — Z79899 Other long term (current) drug therapy: Secondary | ICD-10-CM | POA: Diagnosis not present

## 2015-09-13 DIAGNOSIS — M47817 Spondylosis without myelopathy or radiculopathy, lumbosacral region: Secondary | ICD-10-CM | POA: Diagnosis not present

## 2015-09-13 DIAGNOSIS — G894 Chronic pain syndrome: Secondary | ICD-10-CM | POA: Diagnosis not present

## 2015-09-13 DIAGNOSIS — M5136 Other intervertebral disc degeneration, lumbar region: Secondary | ICD-10-CM | POA: Diagnosis not present

## 2015-09-13 DIAGNOSIS — M545 Low back pain: Secondary | ICD-10-CM | POA: Diagnosis not present

## 2015-09-13 DIAGNOSIS — Z79891 Long term (current) use of opiate analgesic: Secondary | ICD-10-CM | POA: Diagnosis not present

## 2015-09-21 ENCOUNTER — Other Ambulatory Visit: Payer: Medicare Other

## 2015-09-21 ENCOUNTER — Encounter: Payer: Self-pay | Admitting: Family Medicine

## 2015-09-21 ENCOUNTER — Ambulatory Visit (INDEPENDENT_AMBULATORY_CARE_PROVIDER_SITE_OTHER): Payer: Medicare Other | Admitting: Family Medicine

## 2015-09-21 VITALS — BP 124/68 | HR 85 | Temp 98.5°F | Wt 158.0 lb

## 2015-09-21 DIAGNOSIS — D509 Iron deficiency anemia, unspecified: Secondary | ICD-10-CM | POA: Diagnosis not present

## 2015-09-21 LAB — CBC
HCT: 34 % — ABNORMAL LOW (ref 36.0–46.0)
Hemoglobin: 11 g/dL — ABNORMAL LOW (ref 12.0–15.0)
MCHC: 32.3 g/dL (ref 30.0–36.0)
MCV: 86.4 fl (ref 78.0–100.0)
Platelets: 350 10*3/uL (ref 150.0–400.0)
RBC: 3.93 Mil/uL (ref 3.87–5.11)
RDW: 14.7 % (ref 11.5–15.5)
WBC: 9 10*3/uL (ref 4.0–10.5)

## 2015-09-21 LAB — IBC PANEL
Iron: 64 ug/dL (ref 42–145)
Saturation Ratios: 16.6 % — ABNORMAL LOW (ref 20.0–50.0)
Transferrin: 275 mg/dL (ref 212.0–360.0)

## 2015-09-21 LAB — IRON: Iron: 64 ug/dL (ref 42–145)

## 2015-09-21 LAB — FERRITIN: Ferritin: 66.4 ng/mL (ref 10.0–291.0)

## 2015-09-21 NOTE — Progress Notes (Signed)
Cheryl ConchStephen Shifa Brisbon, MD  Subjective:  Cheryl ReeseBetty L Atkinson is a 81 y.o. year old very pleasant female patient who presents for/with See problem oriented charting ROS- no chest pain, shortness of breath, worsening fatigue, headache, blurry vision  Past Medical History-  Patient Active Problem List   Diagnosis Date Noted  . PVD- s/p RCIA PTA 05/04/15 04/26/2015    Priority: High  . Smoker 09/29/2014    Priority: High  . DNR (do not resuscitate) 09/29/2014    Priority: High  . Hyperlipidemia 06/30/2015    Priority: Medium  . Hyperglycemia 07/19/2014    Priority: Medium  . COPD (chronic obstructive pulmonary disease) (HCC) 09/20/2009    Priority: Medium  . Iron deficiency anemia 11/09/2007    Priority: Medium  . RESTLESS LEG SYNDROME 09/25/2007    Priority: Medium  . Essential hypertension 09/25/2007    Priority: Medium  . LOW BACK PAIN 09/25/2007    Priority: Medium  . Osteoporosis 09/25/2007    Priority: Medium  . Tinnitus 12/31/2014    Priority: Low  . Multinodular goiter 08/30/2013    Priority: Low  . Spinal stenosis of lumbar region at multiple levels 09/29/2012    Priority: Low  . HIP PAIN, BILATERAL 07/16/2010    Priority: Low  . CONSTIPATION, CHRONIC 09/20/2009    Priority: Low  . History of UTI 11/09/2007    Priority: Low  . GERD 09/25/2007    Priority: Low  . Claudication (HCC) 05/04/2015    Medications- reviewed and updated Current Outpatient Prescriptions  Medication Sig Dispense Refill  . aspirin EC 81 MG EC tablet Take 1 tablet (81 mg total) by mouth daily.    Marland Kitchen. atorvastatin (LIPITOR) 40 MG tablet Take 1 tablet (40 mg total) by mouth daily at 6 PM. 90 tablet 3  . clopidogrel (PLAVIX) 75 MG tablet Take 1 tablet (75 mg total) by mouth daily with breakfast. 90 tablet 3  . NEUPRO 2 MG/24HR APPLY 1 PATCH ONTO THE SKIN DAILY 90 patch 0  . Olmesartan-Amlodipine-HCTZ (TRIBENZOR) 40-10-12.5 MG TABS Take 1 tablet by mouth daily.    . polyethylene glycol (MIRALAX /  GLYCOLAX) packet Take 17 g by mouth at bedtime as needed for mild constipation.    . traMADol (ULTRAM) 50 MG tablet Take 50 mg by mouth at bedtime as needed for moderate pain.    . traMADol (ULTRAM-ER) 300 MG 24 hr tablet Take 300 mg by mouth daily.    Marland Kitchen. triamcinolone ointment (KENALOG) 0.5 % Apply 1 application topically 2 (two) times daily as needed. itching 30 g 0   No current facility-administered medications for this visit.    Objective: BP 124/68 mmHg  Pulse 85  Temp(Src) 98.5 F (36.9 C)  Wt 158 lb (71.668 kg) Gen: NAD, resting comfortably No mucus membrane pallor CV: RRR no murmurs rubs or gallops Lungs: CTAB no crackles, wheeze, rhonchi Abdomen: soft/nontender/nondistended/normal bowel sounds. No rebound or guarding.  Ext: no edema Skin: warm, dry Neuro: grossly normal, moves all extremities  Results for orders placed or performed in visit on 09/21/15 (from the past 24 hour(s))  CBC (no diff)     Status: Abnormal   Collection Time: 09/21/15 12:39 PM  Result Value Ref Range   WBC 9.0 4.0 - 10.5 K/uL   RBC 3.93 3.87 - 5.11 Mil/uL   Platelets 350.0 150.0 - 400.0 K/uL   Hemoglobin 11.0 (L) 12.0 - 15.0 g/dL   HCT 16.134.0 (L) 09.636.0 - 04.546.0 %   MCV 86.4 78.0 - 100.0  fl   MCHC 32.3 30.0 - 36.0 g/dL   RDW 40.9 81.1 - 91.4 %  Ferritin     Status: None   Collection Time: 09/21/15 12:39 PM  Result Value Ref Range   Ferritin 66.4 10.0 - 291.0 ng/mL  IBC panel     Status: Abnormal   Collection Time: 09/21/15 12:39 PM  Result Value Ref Range   Iron 64 42 - 145 ug/dL   Transferrin 782.9 562.1 - 360.0 mg/dL   Saturation Ratios 30.8 (L) 20.0 - 50.0 %  Iron     Status: None   Collection Time: 09/21/15 12:39 PM  Result Value Ref Range   Iron 64 42 - 145 ug/dL   Assessment/Plan:  Anemia S: baseline hgb in 10s or 11s and mainly in 11s. While hospitalized for angioplasty of right SFA, hgb dipped to 8.9. Concern was possible blood loss. No rectal bleeding or melena to account for  issue. Patient denied and denies now chest pain, shortness of breath, worsening fatigue. She was given stool cards at hospital but no instructions how to use A/P: labs reveal hgb back to 11 today, suspect this was either procedural related or due to blood draws in hospital. Ferritin and iron not low. Will still check stool cards given baseline anemia.   Return precautions advised.   Orders Placed This Encounter  . POC Hemoccult Bld/Stl (3-Cd Home Screen)    Standing Status: Future     Number of Occurrences:      Standing Expiration Date: 09/20/2016

## 2015-09-21 NOTE — Patient Instructions (Signed)
internet out at time of visit

## 2015-09-22 ENCOUNTER — Telehealth: Payer: Self-pay

## 2015-09-22 DIAGNOSIS — I739 Peripheral vascular disease, unspecified: Secondary | ICD-10-CM

## 2015-09-22 NOTE — Telephone Encounter (Signed)
-----   Message from Runell GessJonathan J Berry, MD sent at 09/13/2015  2:35 PM EDT ----- Improved RABI s/p intervention. Repeat 6 months

## 2015-09-22 NOTE — Telephone Encounter (Signed)
Pt aware of results Recall ABI orders placed for 03/2016

## 2015-09-27 ENCOUNTER — Ambulatory Visit (INDEPENDENT_AMBULATORY_CARE_PROVIDER_SITE_OTHER): Payer: Medicare Other | Admitting: Cardiology

## 2015-09-27 ENCOUNTER — Encounter: Payer: Self-pay | Admitting: Cardiology

## 2015-09-27 VITALS — BP 106/60 | HR 67 | Ht 66.0 in | Wt 158.5 lb

## 2015-09-27 DIAGNOSIS — I701 Atherosclerosis of renal artery: Secondary | ICD-10-CM | POA: Insufficient documentation

## 2015-09-27 DIAGNOSIS — J438 Other emphysema: Secondary | ICD-10-CM | POA: Diagnosis not present

## 2015-09-27 DIAGNOSIS — I10 Essential (primary) hypertension: Secondary | ICD-10-CM | POA: Diagnosis not present

## 2015-09-27 DIAGNOSIS — E785 Hyperlipidemia, unspecified: Secondary | ICD-10-CM

## 2015-09-27 DIAGNOSIS — I70219 Atherosclerosis of native arteries of extremities with intermittent claudication, unspecified extremity: Secondary | ICD-10-CM

## 2015-09-27 DIAGNOSIS — I739 Peripheral vascular disease, unspecified: Secondary | ICD-10-CM | POA: Diagnosis not present

## 2015-09-27 NOTE — Progress Notes (Signed)
09/27/2015 Cheryl ReeseBetty L Claus   1931/11/16  [id number]  Primary Physician [name], MD Primary Cardiologist: Dr Allyson SabalBerry  HPI:  81 year old widowed Caucasian female mother of 2 children, grandmother and one grandchildren referred by Dr. Shirlean KellyMatt Wagner, podiatrist, for evaluation treatment of peripheral arterial disease. Her primary care physician is Dr. Annice PihSteven Hunter. Dr Allyson SabalBerry saw her in the office 05/23/15. 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 She smoked 30 pack years and quit April 2016. Her only other risk factor is hypertension and family history. Her father died of a myocardial infarction in his early 5260s. She has never had a heart attack or stroke. She denies chest pain or shortness of breath. She does have bilateral lifestyle limiting claudication. Because of lifestyle limiting claudication and the need to operate on her right foot she underwent angiography on 05/04/15 revealing a hemodynamically significant RCIA disease which was stented, a small abdominal aortic aneurysm measuring +2.4 cm, an occluded right renal artery and bilateral SFA occlusion. Her right claudication somewhat improved.  Dr Allyson SabalBerry suggested endovascular revascularization of her right SFA to allow her to have her foot surgery, followed by LSFA PTA for claudication after she recovers from her foot surgery. On 09/04/15 she underwent successful intervention to her Rt SFA. Dopplers on 09/12/15 showed an ABI of 0.8 on the Rt. She has improved claudication on the Rt.     Current Outpatient Prescriptions  Medication Sig Dispense Refill  . aspirin EC 81 MG EC tablet Take 1 tablet (81 mg total) by mouth daily.    Marland Kitchen. atorvastatin (LIPITOR) 40 MG tablet Take 1 tablet (40 mg total) by mouth daily at 6 PM. 90 tablet 3  . clopidogrel (PLAVIX) 75 MG tablet Take 1 tablet (75 mg total) by mouth daily with breakfast. 90 tablet 3  . NEUPRO 2 MG/24HR APPLY 1 PATCH ONTO THE SKIN DAILY 90 patch 0  . Olmesartan-Amlodipine-HCTZ (TRIBENZOR) 40-10-12.5  MG TABS Take 1 tablet by mouth daily.    . polyethylene glycol (MIRALAX / GLYCOLAX) packet Take 17 g by mouth at bedtime as needed for mild constipation.    . traMADol (ULTRAM) 50 MG tablet Take 50 mg by mouth at bedtime as needed for moderate pain.    . traMADol (ULTRAM-ER) 300 MG 24 hr tablet Take 300 mg by mouth daily.    Marland Kitchen. triamcinolone ointment (KENALOG) 0.5 % Apply 1 application topically 2 (two) times daily as needed. itching 30 g 0   No current facility-administered medications for this visit.    Allergies  Allergen Reactions  . Codeine Other (See Comments)    REACTION: Syncope     Social History   Social History  . Marital Status: Widowed    Spouse Name: N/A  . Number of Children: N/A  . Years of Education: N/A   Occupational History  . Not on file.   Social History Main Topics  . Smoking status: Former Smoker -- 0.50 packs/day for 60 years    Types: Cigarettes    Quit date: 04/02/2015  . Smokeless tobacco: Never Used  . Alcohol Use: No  . Drug Use: No  . Sexual Activity: No   Other Topics Concern  . Not on file   Social History Narrative   Widowed 2013. 2 sons. 1 grandchild.       Retired from [employer].       Hobbies: time at home and with family      HCPOA: sister-in-law and brother. Deirdre EvenerStephen Sumner.  DNR/DNI      Regular exercise: none   Caffeine use: cup of coffee      Review of Systems: General: negative for chills, fever, night sweats or weight changes.  Cardiovascular: negative for chest pain, dyspnea on exertion, edema, orthopnea, palpitations, paroxysmal nocturnal dyspnea or shortness of breath Dermatological: negative for rash Respiratory: negative for cough or wheezing Urologic: negative for hematuria Abdominal: negative for nausea, vomiting, diarrhea, bright red blood per rectum, melena, or hematemesis Neurologic: negative for visual changes, syncope, or dizziness All other systems reviewed and are otherwise negative except as  noted above.    Blood pressure 106/60, pulse 67, height  (1.676 m), weight 158 lb 8 oz (71.895 kg).  General appearance: alert, cooperative and no distress Neck: no carotid bruit and no JVD Lungs: clear to auscultation bilaterally Extremities: no edema Pulses: 1+ DP on Rt , diminnished on Lt Skin: Skin color, texture, turgor normal. No rashes or lesions Neurologic: Grossly normal    ASSESSMENT AND PLAN:   Atherosclerotic PVD with intermittent claudication (HCC) Seen today s/p RSFA atherectomy 09/04/15, RCIA 05/04/15. Residual LSFA disease  Essential hypertension Treated  COPD (chronic obstructive pulmonary disease) No inhalers. No DOE. Quite smoking April 2016  Hyperlipidemia Atorvastatin   Renal artery stenosis (HCC) Rt renal artery stenosis by angiogram June 2016, SCr and B/P WNL.    PLAN  Ms Magner has had RCIA and staged RSFA PTA to allow her to proceed with clubbing surgery (Rt second toe). She will RTC in 6 weeks to discuss LSFA PTA with Dr Allyson Sabal.   Corine Shelter K PA-C 09/27/2015 2:11 PM

## 2015-09-27 NOTE — Assessment & Plan Note (Signed)
Atorvastatin 40 mg 

## 2015-09-27 NOTE — Assessment & Plan Note (Signed)
Treated

## 2015-09-27 NOTE — Assessment & Plan Note (Addendum)
Seen today s/p RSFA atherectomy 09/04/15, RCIA 05/04/15. Residual LSFA disease

## 2015-09-27 NOTE — Assessment & Plan Note (Signed)
Rt renal artery stenosis by angiogram June 2016, SCr and B/P WNL.

## 2015-09-27 NOTE — Assessment & Plan Note (Signed)
No inhalers. No DOE. Quite smoking April 2016

## 2015-09-27 NOTE — Patient Instructions (Signed)
Your physician recommends that you schedule a follow-up appointment in: 6 weeks with Dr Berry. 

## 2015-09-29 ENCOUNTER — Other Ambulatory Visit (INDEPENDENT_AMBULATORY_CARE_PROVIDER_SITE_OTHER): Payer: Medicare Other

## 2015-09-29 DIAGNOSIS — D509 Iron deficiency anemia, unspecified: Secondary | ICD-10-CM

## 2015-09-29 LAB — POC HEMOCCULT BLD/STL (HOME/3-CARD/SCREEN)
Card #2 Fecal Occult Blod, POC: NEGATIVE
Card #3 Fecal Occult Blood, POC: NEGATIVE
Fecal Occult Blood, POC: NEGATIVE

## 2015-10-13 ENCOUNTER — Encounter: Payer: Self-pay | Admitting: Podiatry

## 2015-10-13 ENCOUNTER — Ambulatory Visit (INDEPENDENT_AMBULATORY_CARE_PROVIDER_SITE_OTHER): Payer: Medicare Other | Admitting: Podiatry

## 2015-10-13 VITALS — BP 119/60 | HR 70 | Resp 12

## 2015-10-13 DIAGNOSIS — M2041 Other hammer toe(s) (acquired), right foot: Secondary | ICD-10-CM | POA: Diagnosis not present

## 2015-10-13 DIAGNOSIS — I701 Atherosclerosis of renal artery: Secondary | ICD-10-CM | POA: Diagnosis not present

## 2015-10-13 DIAGNOSIS — I739 Peripheral vascular disease, unspecified: Secondary | ICD-10-CM

## 2015-10-13 DIAGNOSIS — M205X1 Other deformities of toe(s) (acquired), right foot: Secondary | ICD-10-CM

## 2015-10-20 DIAGNOSIS — M205X1 Other deformities of toe(s) (acquired), right foot: Secondary | ICD-10-CM | POA: Insufficient documentation

## 2015-10-20 NOTE — Progress Notes (Signed)
Patient ID: Cheryl ReeseBetty L Houseworth, female   DOB: September 02, 1931, 81 y.o.   MRN: [id number]  Subjective: 81 year old female presents also discussed surgery to the right second toe. Since last appointment she has undergone intervention with Dr. Allyson SabalBerry for PVD. She states the pain that she was having her leg is greatly improved since she had her intervention. She can use for pain in the right second toe the distal aspect. She said the nail becomes elongated due to the contracture the toe becomes painful. She denies any swelling or redness from the toe at this time he denies any open sores. She is content with offloading padding. She has changed shoes. No other complaints at this time.  Objective: AAO 3, NAD DP/PT pulses decreased, CRT less than 3 seconds Protective sensation intact with Simms Weinstein monofilament There is flexion contracture the lateral DIPJ of the right second toe. There is in elongated, thick toenail of the right second toe. There is mild skin irritation along the dorsal aspect of the DIPJ of the right second toe. There is tenderness of the distal aspect of the right second toe. No other areas of tenderness to bilateral lower shoes extremities no open lesions or pre-ulcerative lesions identified bilaterally. There is no pain with calf compression, swelling, warmth, erythema.  Assessment: 81 year old female with right second flexion contracture at the DIPJ.  Plan: -Treatment options discussed including all alternatives, risks, and complications -Nails debrided without complication/leading to patient comfort. -I discussed with her right second digit DIPJ arthroplasty. Although she has been cleared to have surgery from a vascular perspective, I discussed the risk of an elective toe surgery. After long discussion with the patient she would like to follow-up with Dr. Allyson SabalBerry at her scheduled appointment at the start of December before making a decision on surgery. -Follow-up with me after her  evaluation with Dr. Allyson SabalBerry or sooner should any issues arise.   Ovid CurdMatthew Keiden Deskin, DPM

## 2015-11-07 ENCOUNTER — Other Ambulatory Visit: Payer: Self-pay | Admitting: Family Medicine

## 2015-11-08 ENCOUNTER — Ambulatory Visit (INDEPENDENT_AMBULATORY_CARE_PROVIDER_SITE_OTHER): Payer: Medicare Other | Admitting: Cardiovascular Disease

## 2015-11-08 ENCOUNTER — Encounter: Payer: Self-pay | Admitting: Cardiovascular Disease

## 2015-11-08 VITALS — BP 126/72 | HR 69 | Ht 66.0 in | Wt 161.0 lb

## 2015-11-08 DIAGNOSIS — I701 Atherosclerosis of renal artery: Secondary | ICD-10-CM | POA: Diagnosis not present

## 2015-11-08 DIAGNOSIS — I739 Peripheral vascular disease, unspecified: Secondary | ICD-10-CM

## 2015-11-08 DIAGNOSIS — I70219 Atherosclerosis of native arteries of extremities with intermittent claudication, unspecified extremity: Secondary | ICD-10-CM

## 2015-11-08 DIAGNOSIS — I10 Essential (primary) hypertension: Secondary | ICD-10-CM | POA: Diagnosis not present

## 2015-11-08 NOTE — Assessment & Plan Note (Signed)
History of hyperlipidemia on atorvastatin with recent lipid profile performed 06/30/15 revealing total cholesterol 173, LDL 81 and HDL of 79

## 2015-11-08 NOTE — Progress Notes (Signed)
11/08/2015 Cheryl ReeseBetty L Atkinson   1931-03-27  [id number]  Primary Physician [name], MD Primary Cardiologist: Runell GessJonathan J. Shilee Biggs MD Roseanne RenoFACP,FACC,FAHA, FSCAI   HPI:  Cheryl Atkinson is an 81 year old female requiring podiatry procedure on her right foot. She was sent to me by Dr. Ardelle AntonWagoner for claudication and peripheral vascular disease. I stented her right common iliac artery back in June. She did have occluded SFAs bilaterally. She underwent staged right SFA directional atherectomy and drug-eluting balloon angioplasty on 09/04/15 with subsequent Dopplers that showed significant improvement in her right ABI. She no longer has claudication on that side. She does have a palpable pedal pulse. Her problems include discontinued tobacco abuse, hypertension and hyperlipidemia.   Current Outpatient Prescriptions  Medication Sig Dispense Refill  . aspirin EC 81 MG EC tablet Take 1 tablet (81 mg total) by mouth daily.    Marland Kitchen. atorvastatin (LIPITOR) 40 MG tablet Take 1 tablet (40 mg total) by mouth daily at 6 PM. 90 tablet 3  . clopidogrel (PLAVIX) 75 MG tablet Take 1 tablet (75 mg total) by mouth daily with breakfast. 90 tablet 3  . NEUPRO 2 MG/24HR APPLY 1 PATCH ONTO THE SKIN DAILY 90 patch 1  . Olmesartan-Amlodipine-HCTZ (TRIBENZOR) 40-10-12.5 MG TABS Take 1 tablet by mouth daily.    . polyethylene glycol (MIRALAX / GLYCOLAX) packet Take 17 g by mouth at bedtime as needed for mild constipation.    . traMADol (ULTRAM) 50 MG tablet Take 50 mg by mouth at bedtime as needed for moderate pain.    . traMADol (ULTRAM-ER) 300 MG 24 hr tablet Take 300 mg by mouth daily.    Marland Kitchen. triamcinolone ointment (KENALOG) 0.5 % Apply 1 application topically 2 (two) times daily as needed. itching 30 g 0   No current facility-administered medications for this visit.    Allergies  Allergen Reactions  . Codeine Other (See Comments)    REACTION: Syncope     Social History   Social History  . Marital Status: Widowed   Spouse Name: N/A  . Number of Children: N/A  . Years of Education: N/A   Occupational History  . Not on file.   Social History Main Topics  . Smoking status: Former Smoker -- 0.50 packs/day for 60 years    Types: Cigarettes    Quit date: 04/02/2015  . Smokeless tobacco: Never Used  . Alcohol Use: No  . Drug Use: No  . Sexual Activity: No   Other Topics Concern  . Not on file   Social History Narrative   Widowed 2013. 2 sons. 1 grandchild.       Retired from [employer].       Hobbies: time at home and with family      HCPOA: sister-in-law and brother. Deirdre EvenerStephen Sumner.       DNR/DNI      Regular exercise: none   Caffeine use: cup of coffee      Review of Systems: General: negative for chills, fever, night sweats or weight changes.  Cardiovascular: negative for chest pain, dyspnea on exertion, edema, orthopnea, palpitations, paroxysmal nocturnal dyspnea or shortness of breath Dermatological: negative for rash Respiratory: negative for cough or wheezing Urologic: negative for hematuria Abdominal: negative for nausea, vomiting, diarrhea, bright red blood per rectum, melena, or hematemesis Neurologic: negative for visual changes, syncope, or dizziness All other systems reviewed and are otherwise negative except as noted above.    Blood pressure 126/72, pulse 69, height 5\' 6"  (1.676 m), weight  161 lb (73.029 kg).  General appearance: alert and no distress Neck: no adenopathy, no carotid bruit, no JVD, supple, symmetrical, trachea midline and thyroid not enlarged, symmetric, no tenderness/mass/nodules Lungs: clear to auscultation bilaterally Heart: regular rate and rhythm, S1, S2 normal, no murmur, click, rub or gallop Extremities: extremities normal, atraumatic, no cyanosis or edema  EKG not performed today  ASSESSMENT AND PLAN:   Hyperlipidemia History of hyperlipidemia on atorvastatin with recent lipid profile performed 06/30/15 revealing total cholesterol 173,  LDL 81 and HDL of 79  Essential hypertension History of hypertension blood pressure measured at 126/72. She is on Olmasartan ,  amlodipine, hydrochlorothiazide. 2 current meds at current dosing  Atherosclerotic PVD with intermittent claudication (HCC) History of peripheral arterial disease status post right common iliac artery stenting by myself 05/04/15 and staged right SFA directional atherectomy and drug-eluting balloon angioplasty. This was done prior to planned surgery on her foot by Dr. Loreta Ave. Her follow-up lower extremity arterial Doppler studies performed 09/12/15 revealed a right ABI 0.82. At this point, I think that she has adequate circulation to heal a surgical wound on the right foot should that become necessary.      Runell Gess MD FACP,FACC,FAHA, [facility] 11/08/2015 11:30 AM

## 2015-11-08 NOTE — Assessment & Plan Note (Signed)
History of peripheral arterial disease status post right common iliac artery stenting by myself 05/04/15 and staged right SFA directional atherectomy and drug-eluting balloon angioplasty. This was done prior to planned surgery on her foot by Dr. Loreta AveWagner. Her follow-up lower extremity arterial Doppler studies performed 09/12/15 revealed a right ABI 0.82. At this point, I think that she has adequate circulation to heal a surgical wound on the right foot should that become necessary.

## 2015-11-08 NOTE — Assessment & Plan Note (Signed)
History of hypertension blood pressure measured at 126/72. She is on Olmasartan ,  amlodipine, hydrochlorothiazide. 2 current meds at current dosing

## 2015-11-08 NOTE — Patient Instructions (Signed)
Medication Instructions:  Your physician recommends that you continue on your current medications as directed. Please refer to the Current Medication list given to you today.   Labwork: none  Testing/Procedures: Your physician has requested that you have a lower extremity arterial duplex. During this test, exercise and ultrasound are used to evaluate arterial blood flow in the legs. Allow one hour for this exam. There are no restrictions or special instructions. IN 6 MONTHS   Follow-Up: Your physician wants you to follow-up in: 6 MONTHS WITH DR. Allyson SabalBERRY - AFTER LEA'S ARE DONE. You will receive a reminder letter in the mail two months in advance. If you don't receive a letter, please call our office to schedule the follow-up appointment.   Any Other Special Instructions Will Be Listed Below (If Applicable).     If you need a refill on your cardiac medications before your next appointment, please call your pharmacy.

## 2015-11-13 ENCOUNTER — Encounter: Payer: Self-pay | Admitting: Podiatry

## 2015-11-13 ENCOUNTER — Ambulatory Visit (INDEPENDENT_AMBULATORY_CARE_PROVIDER_SITE_OTHER): Payer: Medicare Other | Admitting: Podiatry

## 2015-11-13 DIAGNOSIS — M205X1 Other deformities of toe(s) (acquired), right foot: Secondary | ICD-10-CM

## 2015-11-13 DIAGNOSIS — M2041 Other hammer toe(s) (acquired), right foot: Secondary | ICD-10-CM

## 2015-11-13 DIAGNOSIS — I701 Atherosclerosis of renal artery: Secondary | ICD-10-CM | POA: Diagnosis not present

## 2015-11-13 NOTE — Patient Instructions (Signed)

## 2015-11-14 ENCOUNTER — Encounter: Payer: Self-pay | Admitting: *Deleted

## 2015-11-14 NOTE — Progress Notes (Signed)
Patient ID: Cheryl ReeseBetty L Garno, female   DOB: 1931-09-09, 81 y.o.   MRN: [id number]  Subjective: 81 year old female presents also discussed surgery to the right second toe. Since last appointment she has undergone intervention with Dr. Allyson SabalBerry for PVD and she has been cleared for surgery. This point she is been a surgical shoe for 1 year due to the pain to her toe and she continues have pain. She states that at this time she would pursue a surgical intervention knowing her risks of surgery given her circulation. She is attended multiple conservative treatments including padding, periodic debridement of the callus that she gets up in the toe as well as offloading padding without any relief. No other complaints at this time in no acute changes.  Objective: AAO 3, NAD DP/PT pulses decreased, CRT less than 3 seconds Protective sensation intact with Simms Weinstein monofilament There is flexion contracture the lateral DIPJ of the right second toe. There is in elongated, thick toenail of the right second toe. There is mild skin irritation along the dorsal aspect of the DIPJ of the right second toe. Small hyperkeratotic lesion of the distal aspect of the right second toe. There is tenderness of the distal aspect of the right second toe. No other areas of tenderness to bilateral lower shoes extremities no open lesions or pre-ulcerative lesions identified bilaterally. There is no pain with calf compression, swelling, warmth, erythema.  Assessment: 81 year old female with right second flexion contracture at the DIPJ.  Plan: -Treatment options discussed including all alternatives, risks, and complications -Hyperkerotic lesion debrided without complication/leading to patient comfort. -I discussed with her right second digit DIPJ arthroplasty. Since her risks of surgery and she wishes to proceed with surgery given the pain. -I discussed with her second digit right foot DIPJ arthroplasty. -The incision placement as  well as the postoperative course was discussed with the patient. I discussed risks of the surgery which include, but not limited to, infection, bleeding, pain, swelling, need for further surgery, delayed or nonhealing, painful or ugly scar, numbness or sensation changes, over/under correction, recurrence, transfer lesions, further deformity, hardware failure, DVT/PE, loss of toe/foot. Patient understands these risks and wishes to proceed with surgery. The surgical consent was reviewed with the patient all 3 pages were signed. No promises or guarantees were given to the outcome of the procedure. All questions were answered to the best of my ability. Before the surgery the patient was encouraged to call the office if there is any further questions. [facility]. -Follow-up with me after surgery or sooner should any issues arise.   Ovid CurdMatthew Wagoner, DPM

## 2015-11-15 ENCOUNTER — Encounter: Payer: Self-pay | Admitting: *Deleted

## 2015-11-16 ENCOUNTER — Telehealth: Payer: Self-pay

## 2015-11-16 NOTE — Telephone Encounter (Signed)
Requesting surgical clearance:   1. Type of surgery: Hammer Toe (2nd left foot) repair  2. Surgeon: Dr. Ardelle AntonWagoner  3. Surgical date: 11/29/2015   4. Medications that need to be held: Plavix and ASA  5. CAD: no      6. I will defer to: Dr. Allyson SabalBerry   [facility] [phone] Fax: [phone]

## 2015-11-16 NOTE — Telephone Encounter (Signed)
Okay to interrupt her dual antiplatelet therapy for her podiatry procedure

## 2015-11-17 ENCOUNTER — Telehealth: Payer: Self-pay | Admitting: *Deleted

## 2015-11-17 NOTE — Telephone Encounter (Signed)
Per  Clinical pharmacist , Belenda CruiseKristin  For clarification may hold plavix for 7 days and aspirin 7 days prior to surgery  routed information to the [facility].--ATTN - [phone] Delydia

## 2015-11-17 NOTE — Telephone Encounter (Signed)
I'm calling to let you know that Dr. Allyson SabalBerry said for you stop taking the Plavix and Aspirin 7 days prior to your surgery.  "Yes, I talked to his nurse and she explained.  She told me to stop taking it on 11/22/2015.  Thank you for calling."

## 2015-11-29 DIAGNOSIS — M2041 Other hammer toe(s) (acquired), right foot: Secondary | ICD-10-CM | POA: Diagnosis not present

## 2015-11-29 DIAGNOSIS — I10 Essential (primary) hypertension: Secondary | ICD-10-CM | POA: Diagnosis not present

## 2015-12-08 ENCOUNTER — Ambulatory Visit (INDEPENDENT_AMBULATORY_CARE_PROVIDER_SITE_OTHER): Payer: Medicare Other

## 2015-12-08 ENCOUNTER — Encounter: Payer: Self-pay | Admitting: Podiatry

## 2015-12-08 ENCOUNTER — Ambulatory Visit (INDEPENDENT_AMBULATORY_CARE_PROVIDER_SITE_OTHER): Payer: Medicare Other | Admitting: Podiatry

## 2015-12-08 VITALS — BP 106/62 | HR 72 | Resp 18

## 2015-12-08 DIAGNOSIS — Z9889 Other specified postprocedural states: Secondary | ICD-10-CM

## 2015-12-08 DIAGNOSIS — M2041 Other hammer toe(s) (acquired), right foot: Secondary | ICD-10-CM

## 2015-12-08 DIAGNOSIS — M205X1 Other deformities of toe(s) (acquired), right foot: Secondary | ICD-10-CM

## 2015-12-10 NOTE — Progress Notes (Signed)
Patient ID: Cheryl Atkinson, female   DOB: [birth year], 82 y.o.   MRN: [id number]  Subjective: Cheryl Atkinson is a 82 y.o. is seen today in office s/p R 2nd DIPJ arthorplasty preformed on 11/29/15. They state their pain is minimal. Denies any systemic complaints such as fevers, chills, nausea, vomiting. No calf pain, chest pain, shortness of breath. She did take the bandage off and presents today without a bandage.   Objective: General: No acute distress, AAOx3  DP/PT pulses palpable 2/4, CRT < 3 sec to all digits.  Protective sensation intact. Motor function intact.  Right foot: Incision is well coapted without any evidence of dehiscence. There is no surrounding erythema, ascending cellulitis, fluctuance, crepitus, malodor, drainage/purulence. There is mild edema around the surgical site. There is minimal pain along the surgical site.  No other areas of tenderness to bilateral lower extremities.  No other open lesions or pre-ulcerative lesions.  No pain with calf compression, swelling, warmth, erythema.   Assessment and Plan:  Status post right 2nd digit DIPJ arthoplasty, doing well with no complications   -Treatment options discussed including all alternatives, risks, and complications -Abx ointment and bandage applied. Keep clean, dry, intact.  -Ice/elevation -Pain medication as needed. -Continue darco shoe -Monitor for any clinical signs or symptoms of infection and DVT/PE and directed to call the office immediately should any occur or go to the ER. -Follow-up in 1 week for possible suture removal or sooner if any problems arise. In the meantime, encouraged to call the office with any questions, concerns, change in symptoms.   Ovid CurdMatthew Wagoner, DPM

## 2015-12-18 ENCOUNTER — Ambulatory Visit (INDEPENDENT_AMBULATORY_CARE_PROVIDER_SITE_OTHER): Payer: Medicare Other | Admitting: Podiatry

## 2015-12-18 ENCOUNTER — Encounter: Payer: Self-pay | Admitting: Podiatry

## 2015-12-18 DIAGNOSIS — Z9889 Other specified postprocedural states: Secondary | ICD-10-CM

## 2015-12-18 DIAGNOSIS — M2041 Other hammer toe(s) (acquired), right foot: Secondary | ICD-10-CM

## 2015-12-18 DIAGNOSIS — M205X1 Other deformities of toe(s) (acquired), right foot: Secondary | ICD-10-CM

## 2015-12-19 NOTE — Progress Notes (Signed)
Patient ID: Cheryl Atkinson, female   DOB: Aug 19, 1931, 82 y.o.   MRN: [id number]  Subjective: Cheryl Atkinson is a 81 y.o. is seen today in office s/p R 2nd DIPJ arthorplasty preformed on 11/29/15. She is not having any pain and she presents today for suture remova. Denies any systemic complaints such as fevers, chills, nausea, vomiting. No calf pain, chest pain, shortness of breath. She did take the bandage off and presents today without a bandage.   Objective: General: No acute distress, AAOx3  DP/PT pulses palpable 2/4, CRT < 3 sec to all digits.  Protective sensation intact. Motor function intact.  Right foot: Incision is well coapted without any evidence of dehiscence. There is no surrounding erythema, ascending cellulitis, fluctuance, crepitus, malodor, drainage/purulence. There is mild edema around the surgical site but improved. There is minimal pain along the surgical site.  No other areas of tenderness to bilateral lower extremities.  No other open lesions or pre-ulcerative lesions.  No pain with calf compression, swelling, warmth, erythema.   Assessment and Plan:  Status post right 2nd digit DIPJ arthoplasty, doing well with no complications   -Treatment options discussed including all alternatives, risks, and complications -sutures removed -Abx ointment and bandage applied. Keep clean, dry, intact. Can start to shower. ABX ointment daily -Ice/elevation -Pain medication as needed. -Continue start to transition to regular shoe as tolerated.  -Monitor for any clinical signs or symptoms of infection and DVT/PE and directed to call the office immediately should any occur or go to the ER. -Follow-up as scheduled or sooner if any problems arise. In the meantime, encouraged to call the office with any questions, concerns, change in symptoms.   Ovid Curd, DPM

## 2015-12-20 ENCOUNTER — Encounter: Payer: Self-pay | Admitting: Gastroenterology

## 2016-01-08 ENCOUNTER — Encounter: Payer: Self-pay | Admitting: Podiatry

## 2016-01-08 ENCOUNTER — Ambulatory Visit (INDEPENDENT_AMBULATORY_CARE_PROVIDER_SITE_OTHER): Payer: Medicare Other | Admitting: Podiatry

## 2016-01-08 VITALS — BP 120/64 | HR 70 | Resp 16

## 2016-01-08 DIAGNOSIS — M205X1 Other deformities of toe(s) (acquired), right foot: Secondary | ICD-10-CM

## 2016-01-08 DIAGNOSIS — Z9889 Other specified postprocedural states: Secondary | ICD-10-CM

## 2016-01-08 DIAGNOSIS — M2041 Other hammer toe(s) (acquired), right foot: Secondary | ICD-10-CM

## 2016-01-09 NOTE — Progress Notes (Signed)
Patient ID: Cheryl Atkinson, female   DOB: 1931-08-15, 82 y.o.   MRN: [id number]  Subjective: Cheryl Atkinson is a 82 y.o. is seen today in office s/p R 2nd DIPJ arthorplasty preformed on 11/29/15. She is not having any pain and she is now able to wear a regular shoe. Denies any systemic complaints such as fevers, chills, nausea, vomiting. No calf pain, chest pain, shortness of breath. She did take the bandage off and presents today without a bandage.   Objective: General: No acute distress, AAOx3  DP/PT pulses palpable 2/4, CRT < 3 sec to all digits.  Protective sensation intact. Motor function intact.  Right foot: Incision is well coapted without any evidence of dehiscence and a scar has formed. There is no surrounding erythema, ascending cellulitis, fluctuance, crepitus, malodor, drainage/purulence. There is mild edema around the surgical site but continues to improve. No erythema or increase in warmth. There is no pain along the surgical site.  Small hyperkeratotic lesion the distal aspect of the second toe. Upon debridement no underlying ulceration, drainage or other signs of infection. No other areas of tenderness to bilateral lower extremities.  No other open lesions or pre-ulcerative lesions.  No pain with calf compression, swelling, warmth, erythema.   Assessment and Plan:  Status post right 2nd digit DIPJ arthoplasty, doing well with no complications   -Treatment options discussed including all alternatives, risks, and complications -Continue to tape the toes to help control swelling -Continue regular shoegear -Hyperkerotic lesion debrided without complications or bleeding.  -Monitor for any clinical signs or symptoms of infection and DVT/PE and directed to call the office immediately should any occur or go to the ER. -Follow-up as needed. In the meantime, encouraged to call the office with any questions, concerns, change in symptoms.   Ovid Curd, DPM

## 2016-02-07 ENCOUNTER — Other Ambulatory Visit: Payer: Self-pay | Admitting: Family Medicine

## 2016-03-04 ENCOUNTER — Encounter (INDEPENDENT_AMBULATORY_CARE_PROVIDER_SITE_OTHER): Payer: Medicare Other | Admitting: Ophthalmology

## 2016-03-04 DIAGNOSIS — H353132 Nonexudative age-related macular degeneration, bilateral, intermediate dry stage: Secondary | ICD-10-CM | POA: Diagnosis not present

## 2016-03-04 DIAGNOSIS — H5340 Unspecified visual field defects: Secondary | ICD-10-CM

## 2016-03-04 DIAGNOSIS — H43813 Vitreous degeneration, bilateral: Secondary | ICD-10-CM

## 2016-03-04 DIAGNOSIS — H35033 Hypertensive retinopathy, bilateral: Secondary | ICD-10-CM

## 2016-03-04 DIAGNOSIS — I10 Essential (primary) hypertension: Secondary | ICD-10-CM | POA: Diagnosis not present

## 2016-03-06 ENCOUNTER — Encounter (HOSPITAL_COMMUNITY): Payer: Self-pay | Admitting: *Deleted

## 2016-03-06 ENCOUNTER — Ambulatory Visit (HOSPITAL_COMMUNITY)
Admission: EM | Admit: 2016-03-06 | Discharge: 2016-03-06 | Disposition: A | Discharge status: Home / Self Care | Payer: Medicare Other | Attending: Family Medicine | Admitting: Family Medicine

## 2016-03-06 DIAGNOSIS — N390 Urinary tract infection, site not specified: Secondary | ICD-10-CM | POA: Diagnosis not present

## 2016-03-06 LAB — POCT URINALYSIS DIP (DEVICE)
Bilirubin Urine: NEGATIVE
Glucose, UA: NEGATIVE mg/dL
Ketones, ur: NEGATIVE mg/dL
Nitrite: NEGATIVE
Protein, ur: NEGATIVE mg/dL
Specific Gravity, Urine: 1.015 (ref 1.005–1.030)
Urobilinogen, UA: 0.2 mg/dL (ref 0.0–1.0)
pH: 6 (ref 5.0–8.0)

## 2016-03-06 MED ORDER — CEPHALEXIN 500 MG PO CAPS: 500.0000 mg | ORAL_CAPSULE | Freq: Four times a day (QID) | ORAL | Status: DC

## 2016-03-06 NOTE — Discharge Instructions (Signed)
Take all of medicine as directed, drink lots of fluids, see your doctor if further problems. °

## 2016-03-06 NOTE — ED Provider Notes (Addendum)
CSN: [id number]     Arrival date & time 03/06/16  1259 History   First MD Initiated Contact with Patient 03/06/16 1353     Chief Complaint  Patient presents with  . Urinary Tract Infection   (Consider location/radiation/quality/duration/timing/severity/associated sxs/prior Treatment) Patient is a 82 y.o. female presenting with urinary tract infection. The history is provided by the patient.  Urinary Tract Infection Pain quality:  Burning Pain severity:  Mild Onset quality:  Gradual Duration:  4 days Progression:  Worsening Chronicity:  Recurrent Recent urinary tract infections: no (it has been a long time.)   Relieved by:  None tried Worsened by:  Nothing tried Urinary symptoms: foul-smelling urine and frequent urination     Past Medical History  Diagnosis Date  . Thyroid disease   . Restless leg syndrome   . Osteoporosis   . Constipation   . Anemia   . Hypertension   . Cellulitis of right lower extremity 11/27/2013  . Peripheral arterial disease (HCC)   . GERD (gastroesophageal reflux disease)   . Arthritis     "shoulders" (05/04/2015)  . Chronic lower back pain   . History of hiatal hernia    Past Surgical History  Procedure Laterality Date  . Appendectomy    . Tonsillectomy    . Cataract extraction w/ intraocular lens  implant, bilateral Bilateral   . Peripheral vascular catheterization N/A 05/04/2015    Procedure: Lower Extremity Angiography;  Surgeon: Runell GessJonathan J Berry, MD;  [facility];  Service: Cardiovascular;  Laterality: N/A;  . Abdominal aortagram  05/04/2015    Procedure: Abdominal Aortagram;  Surgeon: Runell GessJonathan J Berry, MD;  Location: [facility] INVASIVE CV LAB;  Service: Cardiovascular;;  . Peripheral vascular catheterization  05/04/2015    Procedure: Peripheral Vascular Intervention;  Surgeon: Runell GessJonathan J Berry, MD;  [facility];  Service: Cardiovascular;;  RCIA - [id number] ICAST  . Vaginal hysterectomy    . Thyroid surgery Right ?2013   "had goiter taken off my neck"  . Sfa Right 09/04/2015    de balloon  . Peripheral vascular catheterization Right 09/04/2015    Procedure: Peripheral Vascular Atherectomy;  Surgeon: Runell GessJonathan J Berry, MD;  [facility];  Service: Cardiovascular;  Laterality: Right;  SFA   Family History  Problem Relation Age of Onset  . Coronary artery disease    . Heart disease Father    Social History  Substance Use Topics  . Smoking status: Former Smoker -- 0.50 packs/day for 60 years    Types: Cigarettes    Quit date: 04/02/2015  . Smokeless tobacco: Never Used  . Alcohol Use: No   OB History    No data available     Review of Systems  Constitutional: Negative.   Gastrointestinal: Negative.   Genitourinary: Positive for dysuria, urgency and frequency.  All other systems reviewed and are negative.   Allergies  Codeine  Home Medications   Prior to Admission medications   Medication Sig Start Date End Date Taking? Authorizing Provider  aspirin EC 81 MG EC tablet Take 1 tablet (81 mg total) by mouth daily. 05/05/15   Abelino DerrickLuke K Kilroy, PA-C  atorvastatin (LIPITOR) 40 MG tablet Take 1 tablet (40 mg total) by mouth daily at 6 PM. 05/05/15   Abelino DerrickLuke K Kilroy, PA-C  cephALEXin (KEFLEX) 500 MG capsule Take 1 capsule (500 mg total) by mouth 4 (four) times daily. Take all of medicine and drink lots of fluids 03/06/16   Quita SkyeJames D  Danya Spearman, MD  clopidogrel (PLAVIX) 75 MG tablet Take 1 tablet (75 mg total) by mouth daily with breakfast. 05/05/15   Abelino Derrick, PA-C  NEUPRO 2 MG/24HR APPLY 1 PATCH ONTO THE SKIN DAILY 11/07/15   Shelva Majestic, MD  Olmesartan-Amlodipine-HCTZ [facility]) 40-10-12.5 MG TABS Take 1 tablet by mouth daily.    Historical Provider, MD  polyethylene glycol (MIRALAX / GLYCOLAX) packet Take 17 g by mouth at bedtime as needed for mild constipation.    Historical Provider, MD  traMADol (ULTRAM) 50 MG tablet Take 50 mg by mouth at bedtime as needed for moderate pain.    Historical  Provider, MD  traMADol (ULTRAM-ER) 300 MG 24 hr tablet Take 300 mg by mouth daily.    Historical Provider, MD  triamcinolone ointment (KENALOG) 0.5 % Apply 1 application topically 2 (two) times daily as needed. itching 08/16/15   Charm Rings, MD  TRIBENZOR 40-10-12.5 MG TABS TAKE 1 TABLET DAILY 02/07/16   Shelva Majestic, MD   Meds Ordered and Administered this Visit  Medications - No data to display  BP 123/67 mmHg  Pulse 69  Temp(Src) 98.6 F (37 C) (Oral)  Resp 16  SpO2 99% No data found.   Physical Exam  Constitutional: She is oriented to person, place, and time. She appears well-developed and well-nourished. No distress.  Neck: Normal range of motion. Neck supple.  Abdominal: Soft. Bowel sounds are normal. She exhibits no distension and no mass. There is no tenderness. There is no rebound and no guarding.  Neurological: She is alert and oriented to person, place, and time.  Skin: Skin is warm and dry.  Nursing note and vitals reviewed.   ED Course  Procedures (including critical care time)  Labs Review Labs Reviewed  POCT URINALYSIS DIP (DEVICE) - Abnormal; Notable for the following:    Hgb urine dipstick TRACE (*)    Leukocytes, UA SMALL (*)    All other components within normal limits    Imaging Review No results found.   Visual Acuity Review  Right Eye Distance:   Left Eye Distance:   Bilateral Distance:    Right Eye Near:   Left Eye Near:    Bilateral Near:         MDM   1. UTI (lower urinary tract infection)    Meds ordered this encounter  Medications  . cephALEXin (KEFLEX) 500 MG capsule    Sig: Take 1 capsule (500 mg total) by mouth 4 (four) times daily. Take all of medicine and drink lots of fluids    Dispense:  20 capsule    Refill:  0       Linna Hoff, MD 03/06/16 1420  Linna Hoff, MD 03/06/16 1420

## 2016-03-06 NOTE — ED Notes (Signed)
Pt  Reports    Symptoms   Of      Urinary  Tract  Infection  To   Include       Frequency    And  Discomfort  As   Well       Pt  Ambulates     With  A  Steady    Gait does not  Appear  To be  Septic  She  Is  Alert  And  Oriented

## 2016-03-07 DIAGNOSIS — M545 Low back pain: Secondary | ICD-10-CM | POA: Diagnosis not present

## 2016-03-07 DIAGNOSIS — M47817 Spondylosis without myelopathy or radiculopathy, lumbosacral region: Secondary | ICD-10-CM | POA: Diagnosis not present

## 2016-03-07 DIAGNOSIS — M5136 Other intervertebral disc degeneration, lumbar region: Secondary | ICD-10-CM | POA: Diagnosis not present

## 2016-03-14 ENCOUNTER — Ambulatory Visit (INDEPENDENT_AMBULATORY_CARE_PROVIDER_SITE_OTHER): Payer: Medicare Other | Admitting: Family Medicine

## 2016-03-14 ENCOUNTER — Encounter: Payer: Self-pay | Admitting: Family Medicine

## 2016-03-14 VITALS — BP 112/70 | HR 68 | Temp 98.5°F | Wt 164.0 lb

## 2016-03-14 DIAGNOSIS — M810 Age-related osteoporosis without current pathological fracture: Secondary | ICD-10-CM

## 2016-03-14 DIAGNOSIS — E785 Hyperlipidemia, unspecified: Secondary | ICD-10-CM

## 2016-03-14 DIAGNOSIS — R6889 Other general symptoms and signs: Secondary | ICD-10-CM | POA: Diagnosis not present

## 2016-03-14 DIAGNOSIS — D509 Iron deficiency anemia, unspecified: Secondary | ICD-10-CM | POA: Diagnosis not present

## 2016-03-14 DIAGNOSIS — I10 Essential (primary) hypertension: Secondary | ICD-10-CM

## 2016-03-14 DIAGNOSIS — Z0000 Encounter for general adult medical examination without abnormal findings: Secondary | ICD-10-CM | POA: Diagnosis not present

## 2016-03-14 DIAGNOSIS — F17200 Nicotine dependence, unspecified, uncomplicated: Secondary | ICD-10-CM

## 2016-03-14 DIAGNOSIS — Z720 Tobacco use: Secondary | ICD-10-CM

## 2016-03-14 DIAGNOSIS — J438 Other emphysema: Secondary | ICD-10-CM

## 2016-03-14 DIAGNOSIS — Z0001 Encounter for general adult medical examination with abnormal findings: Secondary | ICD-10-CM

## 2016-03-14 LAB — COMPREHENSIVE METABOLIC PANEL
ALT: 12 U/L (ref 0–35)
AST: 18 U/L (ref 0–37)
Albumin: 4 g/dL (ref 3.5–5.2)
Alkaline Phosphatase: 108 U/L (ref 39–117)
BUN: 21 mg/dL (ref 6–23)
CO2: 30 mEq/L (ref 19–32)
Calcium: 9.3 mg/dL (ref 8.4–10.5)
Chloride: 102 mEq/L (ref 96–112)
Creatinine, Ser: 0.72 mg/dL (ref 0.40–1.20)
GFR: 82.31 mL/min (ref >60.00)
Glucose, Bld: 101 mg/dL — ABNORMAL HIGH (ref 70–99)
Potassium: 4.3 mEq/L (ref 3.5–5.1)
Sodium: 139 mEq/L (ref 135–145)
Total Bilirubin: 0.5 mg/dL (ref 0.2–1.2)
Total Protein: 7.3 g/dL (ref 6.0–8.3)

## 2016-03-14 LAB — LDL CHOLESTEROL, DIRECT: Direct LDL: 70 mg/dL

## 2016-03-14 LAB — CBC
HCT: 33.5 % — ABNORMAL LOW (ref 36.0–46.0)
Hemoglobin: 11.1 g/dL — ABNORMAL LOW (ref 12.0–15.0)
MCHC: 33.1 g/dL (ref 30.0–36.0)
MCV: 85.7 fl (ref 78.0–100.0)
Platelets: 295 10*3/uL (ref 150.0–400.0)
RBC: 3.92 Mil/uL (ref 3.87–5.11)
RDW: 15.3 % (ref 11.5–15.5)
WBC: 7.9 10*3/uL (ref 4.0–10.5)

## 2016-03-14 NOTE — Assessment & Plan Note (Signed)
S: not ideally controlled on atorvastatin 40mg . No myalgias.  Lab Results  Component Value Date   CHOL 173 06/30/2015   HDL 79.10 06/30/2015   LDLCALC 81 06/30/2015   TRIG 63.0 06/30/2015   CHOLHDL 2 06/30/2015   A/P: get direct LDL, consider crestor 40mg 

## 2016-03-14 NOTE — Assessment & Plan Note (Signed)
Has not resumed smoking- praised patient efforts

## 2016-03-14 NOTE — Assessment & Plan Note (Signed)
S: improved on last check. Ferritin was normal A/P: will update cbc today

## 2016-03-14 NOTE — Assessment & Plan Note (Signed)
Took Fosamax for a few years but was hurting stomach.  Does not seem high on idea of prolia but willing to update dexa then discuss.

## 2016-03-14 NOTE — Progress Notes (Signed)
Cheryl ConchStephen Darthy Manganelli, MD Phone: [phone]  Subjective:  Patient presents today for their annual wellness visit.    Preventive Screening-Counseling & Management  Smoking Status: former Smoker. Quit again April 2016. Remained off Second Hand Smoking status: son smokes in some  Risk Factors Regular exercise: none at this point, limited by PAD Diet: does not want to make any changes Wt Readings from Last 3 Encounters:  03/14/16 164 lb (74.39 kg)  11/08/15 161 lb (73.029 kg)  09/27/15 158 lb 8 oz (71.895 kg)  Fall Risk: yes, uses cane or walker usually. Larey SeatFell in last year when went out to mailbox and did not have cane. Encouraged to always have cane with her.    Cardiac risk factors:  advanced age (older than 3055 for men, 265 for women)  Known PAD- CAD equivalent Hyperlipidemia on atorvastatin 40mg   No diabetes.  Hypertension controlled   Depression Screen None. PHQ9 of 13. Biggest stressor is her son who stays in the home and does not treat her well (smokes around the house, does not act right- not abusive). She does not have anhedonia so does not meet full criteria- this may be situational. She declines counseling or medication. Follow up in 1 month.   Activities of Daily Living Independent ADLs and IADLs   Hearing Difficulties: -patient declines  Cognitive Testing No reported trouble.   0/3 three word recall . Will do full MMSE at follow up in 1 month. Depression could contribute  List the Names of Other Physician/Practitioners you currently use: -Dr. Ashley RoyaltyMatthews optho -Dr. Allyson SabalBerry cardiology - Dr. Lorelle FormosaSaulo orthopedics for back  Immunization History  Administered Date(s) Administered  . Influenza Split 08/20/2011, 08/28/2012, 09/29/2012  . Influenza Whole 09/25/2007, 09/20/2009  . Influenza, High Dose Seasonal PF 09/03/2014  . Influenza,inj,Quad PF,36+ Mos 08/30/2013, 09/05/2015  . Pneumococcal Conjugate-13 09/29/2014  . Pneumococcal Polysaccharide-23 12/02/2005  .  Td 11/01/1993  . Tdap 11/27/2013  . Zoster 02/17/2007   Required Immunizations needed today none  Screening tests- up to date  ROS- shortness of breath with walking to her car, no chest pain, no shortness of breath at rest, no headache or blurry vision, admits to depressed mood  The following were reviewed and entered/updated in epic: Past Medical History  Diagnosis Date  . Thyroid disease   . Restless leg syndrome   . Osteoporosis   . Constipation   . Anemia   . Hypertension   . Cellulitis of right lower extremity 11/27/2013  . Peripheral arterial disease (HCC)   . GERD (gastroesophageal reflux disease)   . Arthritis     "shoulders" (05/04/2015)  . Chronic lower back pain   . History of hiatal hernia    Patient Active Problem List   Diagnosis Date Noted  . Atherosclerotic PVD with intermittent claudication (HCC) 04/26/2015    Priority: High  . Smoker 09/29/2014    Priority: High  . DNR (do not resuscitate) 09/29/2014    Priority: High  . Hyperlipidemia 06/30/2015    Priority: Medium  . Hyperglycemia 07/19/2014    Priority: Medium  . COPD (chronic obstructive pulmonary disease) (HCC) 09/20/2009    Priority: Medium  . Iron deficiency anemia 11/09/2007    Priority: Medium  . RESTLESS LEG SYNDROME 09/25/2007    Priority: Medium  . Essential hypertension 09/25/2007    Priority: Medium  . LOW BACK PAIN 09/25/2007    Priority: Medium  . Osteoporosis 09/25/2007    Priority: Medium  . Tinnitus 12/31/2014    Priority: Low  .  Multinodular goiter 08/30/2013    Priority: Low  . Spinal stenosis of lumbar region at multiple levels 09/29/2012    Priority: Low  . HIP PAIN, BILATERAL 07/16/2010    Priority: Low  . CONSTIPATION, CHRONIC 09/20/2009    Priority: Low  . History of UTI 11/09/2007    Priority: Low  . GERD 09/25/2007    Priority: Low  . Mallet toe of right foot 10/20/2015  . Renal artery stenosis (HCC) 09/27/2015  . Claudication [facility]) 05/04/2015   Past  Surgical History  Procedure Laterality Date  . Appendectomy    . Tonsillectomy    . Cataract extraction w/ intraocular lens  implant, bilateral Bilateral   . Peripheral vascular catheterization N/A 05/04/2015    Procedure: Lower Extremity Angiography;  Surgeon: Runell Gess, MD;  Location: [facility] INVASIVE CV LAB;  Service: Cardiovascular;  Laterality: N/A;  . Abdominal aortagram  05/04/2015    Procedure: Abdominal Aortagram;  Surgeon: Runell Gess, MD;  Location: [facility] INVASIVE CV LAB;  Service: Cardiovascular;;  . Peripheral vascular catheterization  05/04/2015    Procedure: Peripheral Vascular Intervention;  Surgeon: Runell Gess, MD;  Location: [facility] INVASIVE CV LAB;  Service: Cardiovascular;;  RCIA - [id number] ICAST  . Vaginal hysterectomy    . Thyroid surgery Right ?2013    "had goiter taken off my neck"  . Sfa Right 09/04/2015    de balloon  . Peripheral vascular catheterization Right 09/04/2015    Procedure: Peripheral Vascular Atherectomy;  Surgeon: Runell Gess, MD;  Location: [facility] INVASIVE CV LAB;  Service: Cardiovascular;  Laterality: Right;  SFA    Family History  Problem Relation Age of Onset  . Coronary artery disease    . Heart disease Father     Medications- reviewed and updated Current Outpatient Prescriptions  Medication Sig Dispense Refill  . aspirin EC 81 MG EC tablet Take 1 tablet (81 mg total) by mouth daily.    Marland Kitchen atorvastatin (LIPITOR) 40 MG tablet Take 1 tablet (40 mg total) by mouth daily at 6 PM. 90 tablet 3  . cephALEXin (KEFLEX) 500 MG capsule Take 1 capsule (500 mg total) by mouth 4 (four) times daily. Take all of medicine and drink lots of fluids 20 capsule 0  . clopidogrel (PLAVIX) 75 MG tablet Take 1 tablet (75 mg total) by mouth daily with breakfast. 90 tablet 3  . NEUPRO 2 MG/24HR APPLY 1 PATCH ONTO THE SKIN DAILY 90 patch 1  . Olmesartan-Amlodipine-HCTZ (TRIBENZOR) 40-10-12.5 MG TABS Take 1 tablet by mouth daily.    . polyethylene glycol (MIRALAX /  GLYCOLAX) packet Take 17 g by mouth at bedtime as needed for mild constipation.    . traMADol (ULTRAM) 50 MG tablet Take 50 mg by mouth at bedtime as needed for moderate pain.    . traMADol (ULTRAM-ER) 300 MG 24 hr tablet Take 300 mg by mouth daily.    Marland Kitchen triamcinolone ointment (KENALOG) 0.5 % Apply 1 application topically 2 (two) times daily as needed. itching 30 g 0  . TRIBENZOR 40-10-12.5 MG TABS TAKE 1 TABLET DAILY 90 tablet 1   No current facility-administered medications for this visit.    Allergies-reviewed and updated Allergies  Allergen Reactions  . Codeine Other (See Comments)    REACTION: Syncope     Social History   Social History  . Marital Status: Widowed    Spouse Name: N/A  . Number of Children: N/A  . Years of Education: N/A   Social History Main  Topics  . Smoking status: Former Smoker -- 0.50 packs/day for 60 years    Types: Cigarettes    Quit date: 04/02/2015  . Smokeless tobacco: Never Used  . Alcohol Use: No  . Drug Use: No  . Sexual Activity: No   Other Topics Concern  . Not on file   Social History Narrative   Widowed 2013. 2 sons. 1 grandchild.       Retired from [employer].       Hobbies: time at home and with family      HCPOA: sister-in-law and brother. Cheryl Atkinson.       DNR/DNI      Regular exercise: none   Caffeine use: cup of coffee     Objective: BP 112/70 mmHg  Pulse 68  Temp(Src) 98.5 F (36.9 C)  Wt 164 lb (74.39 kg) Gen: NAD, resting comfortably HEENT: Mucous membranes are moist. Oropharynx normal Neck: no thyromegaly CV: RRR no murmurs rubs or gallops Lungs: CTAB no crackles, wheeze, rhonchi Abdomen: soft/nontender/nondistended/normal bowel sounds. No rebound or guarding.  Ext: no edema Skin: warm, dry Neuro: grossly normal, moves all extremities, PERRLA  Assessment/Plan:  AWV completed- discussed recommended screenings anddocumented any personalized health advice and referrals for preventive counseling.  See AVS as well which was given to patient.   Smoker Has not resumed smoking- praised patient efforts  Iron deficiency anemia S: improved on last check. Ferritin was normal A/P: will update cbc today   Essential hypertension S: controlled on olmesartan-amlodipine-hctz BP Readings from Last 3 Encounters:  03/14/16 112/70  03/06/16 123/67  01/08/16 120/64  A/P:Continue current meds:  Well controlled   COPD (chronic obstructive pulmonary disease) (HCC) S: she now finally admits she does have some dyspnea on exertion. She is not interested in inhalers. Cannot walk to her car without shortness of breath.  Admits this has been going on for some time. Not limited because does not have a lot of activity. Encouraged her to walk as much as possible.  A/P: declines  Inhalers- discussed this is an option.  Hyperlipidemia S: not ideally controlled on atorvastatin . No myalgias.  Lab Results  Component Value Date   CHOL 173 06/30/2015   HDL 79.10 06/30/2015   LDLCALC 81 06/30/2015   TRIG 63.0 06/30/2015   CHOLHDL 2 06/30/2015   A/P: get direct LDL, consider crestor      Return in about 4 weeks (around 04/11/2016). for depression and memory recheck. Return precautions advised.   Orders Placed This Encounter  Procedures  . DG Bone Density    Standing Status: Future     Number of Occurrences:      Standing Expiration Date: 05/14/2017    Order Specific Question:  Reason for Exam (SYMPTOM  OR DIAGNOSIS REQUIRED)    Answer:  osteoporosis follow up    Order Specific Question:  Preferred imaging location?    Answer:  [facility]  . CBC    [place]  . Comprehensive metabolic panel    [place]  . LDL cholesterol, direct    [place]

## 2016-03-14 NOTE — Assessment & Plan Note (Signed)
S: she now finally admits she does have some dyspnea on exertion. She is not interested in inhalers. Cannot walk to her car without shortness of breath.  Admits this has been going on for some time. Not limited because does not have a lot of activity. Encouraged her to walk as much as possible.  A/P: declines  Inhalers- discussed this is an option.

## 2016-03-14 NOTE — Patient Instructions (Addendum)
  [name] , Thank you for taking time to come for your Medicare Wellness Visit. I appreciate your ongoing commitment to your health goals. Please review the following plan we discussed and let me know if I can assist you in the future.   These are the goals we discussed: 1. Schedule your bone density test at check out desk 2. Schedule follow up in 1 month so we can do memory testing and check in on depression. You declined medicine. Please consider calling to meet with therapist. If you have thoughts of hurting yourself call us or 911 immediately 3. Labs before you leave 4. Remain smoking free   This is a list of the screening recommended for you and due dates:  Health Maintenance  Topic Date Due  . Flu Shot  07/02/2016  . Tetanus Vaccine  11/28/2023  . DEXA scan (bone density measurement)  Completed  . Shingles Vaccine  Completed  . Pneumonia vaccines  Completed

## 2016-03-14 NOTE — Assessment & Plan Note (Signed)
S: controlled on olmesartan-amlodipine-hctz BP Readings from Last 3 Encounters:  03/14/16 112/70  03/06/16 123/67  01/08/16 120/64  A/P:Continue current meds:  Well controlled

## 2016-03-18 ENCOUNTER — Ambulatory Visit (INDEPENDENT_AMBULATORY_CARE_PROVIDER_SITE_OTHER)
Admission: RE | Admit: 2016-03-18 | Discharge: 2016-03-18 | Disposition: A | Discharge status: Home / Self Care | Payer: Medicare Other | Source: Ambulatory Visit | Attending: Family Medicine | Admitting: Family Medicine

## 2016-03-18 DIAGNOSIS — M810 Age-related osteoporosis without current pathological fracture: Secondary | ICD-10-CM | POA: Diagnosis not present

## 2016-04-10 ENCOUNTER — Other Ambulatory Visit: Payer: Self-pay | Admitting: Cardiology

## 2016-04-10 NOTE — Telephone Encounter (Signed)
Rx has been sent to the pharmacy electronically. ° °

## 2016-04-11 ENCOUNTER — Ambulatory Visit (INDEPENDENT_AMBULATORY_CARE_PROVIDER_SITE_OTHER): Payer: Medicare Other | Admitting: Family Medicine

## 2016-04-11 ENCOUNTER — Encounter: Payer: Self-pay | Admitting: Family Medicine

## 2016-04-11 VITALS — BP 140/70 | HR 75 | Temp 98.3°F | Wt 166.0 lb

## 2016-04-11 DIAGNOSIS — R413 Other amnesia: Secondary | ICD-10-CM | POA: Diagnosis not present

## 2016-04-11 DIAGNOSIS — Z7289 Other problems related to lifestyle: Secondary | ICD-10-CM | POA: Diagnosis not present

## 2016-04-11 DIAGNOSIS — Z114 Encounter for screening for human immunodeficiency virus [HIV]: Secondary | ICD-10-CM

## 2016-04-11 DIAGNOSIS — Z1159 Encounter for screening for other viral diseases: Secondary | ICD-10-CM | POA: Diagnosis not present

## 2016-04-11 LAB — TSH: TSH: 0.89 u[IU]/mL (ref 0.35–4.50)

## 2016-04-11 LAB — VITAMIN B12: Vitamin B-12: 410 pg/mL (ref 211–911)

## 2016-04-11 NOTE — Progress Notes (Signed)
Subjective:  Cheryl Atkinson is a 82 y.o. year old very pleasant female patient who presents for/with See problem oriented charting ROS- No facial or extremity weakness. No slurred words or trouble swallowing. no blurry vision or double vision. No paresthesias. No confusion or word finding difficulties. Marland Kitchen.see any ROS included in HPI as well.   Past Medical History-  Patient Active Problem List   Diagnosis Date Noted  . Memory loss 04/11/2016    Priority: High  . Renal artery stenosis (HCC) 09/27/2015    Priority: High  . Atherosclerotic PVD with intermittent claudication (HCC) 04/26/2015    Priority: High  . Smoker 09/29/2014    Priority: High  . DNR (do not resuscitate) 09/29/2014    Priority: High  . Hyperlipidemia 06/30/2015    Priority: Medium  . Hyperglycemia 07/19/2014    Priority: Medium  . COPD (chronic obstructive pulmonary disease) (HCC) 09/20/2009    Priority: Medium  . Iron deficiency anemia 11/09/2007    Priority: Medium  . RESTLESS LEG SYNDROME 09/25/2007    Priority: Medium  . Essential hypertension 09/25/2007    Priority: Medium  . LOW BACK PAIN 09/25/2007    Priority: Medium  . Osteoporosis 09/25/2007    Priority: Medium  . Mallet toe of right foot 10/20/2015    Priority: Low  . Tinnitus 12/31/2014    Priority: Low  . Multinodular goiter 08/30/2013    Priority: Low  . Spinal stenosis of lumbar region at multiple levels 09/29/2012    Priority: Low  . HIP PAIN, BILATERAL 07/16/2010    Priority: Low  . CONSTIPATION, CHRONIC 09/20/2009    Priority: Low  . History of UTI 11/09/2007    Priority: Low  . GERD 09/25/2007    Priority: Low  . Claudication (HCC) 05/04/2015    Medications- reviewed and updated Current Outpatient Prescriptions  Medication Sig Dispense Refill  . aspirin EC 81 MG EC tablet Take 1 tablet (81 mg total) by mouth daily.    Marland Kitchen. atorvastatin (LIPITOR) 40 MG tablet TAKE 1 TABLET DAILY AT 6 P.M. 90 tablet 0  . clopidogrel (PLAVIX) 75 MG  tablet TAKE 1 TABLET DAILY WITH BREAKFAST 90 tablet 0  . NEUPRO 2 MG/24HR APPLY 1 PATCH ONTO THE SKIN DAILY 90 patch 1  . Olmesartan-Amlodipine-HCTZ (TRIBENZOR) 40-10-12.5 MG TABS Take 1 tablet by mouth daily.    . polyethylene glycol (MIRALAX / GLYCOLAX) packet Take 17 g by mouth at bedtime as needed for mild constipation. Reported on 03/14/2016    . triamcinolone ointment (KENALOG) 0.5 % Apply 1 application topically 2 (two) times daily as needed. itching 30 g 0  . TRIBENZOR 40-10-12.5 MG TABS TAKE 1 TABLET DAILY 90 tablet 1  . traMADol (ULTRAM) 50 MG tablet Take 50 mg by mouth at bedtime as needed for moderate pain. Reported on 04/11/2016    . traMADol (ULTRAM-ER) 300 MG 24 hr tablet Take 300 mg by mouth daily. Reported on 04/11/2016     No current facility-administered medications for this visit.    Objective: BP 140/70 mmHg  Pulse 75  Temp(Src) 98.3 F (36.8 C)  Wt 166 lb (75.297 kg) Gen: NAD, resting comfortably CV: RRR no murmurs rubs or gallops Lungs: CTAB no crackles, wheeze, rhonchi Abdomen: soft/nontender/nondistended/normal bowel sounds. No rebound or guarding.  Ext: no edema Skin: warm, dry Neuro: CN II-XII intact, sensation and reflexes normal throughout, 5/5 muscle strength in bilateral upper and lower extremities. Normal finger to nose. Normal rapid alternating movements. No pronator drift. Normal  romberg. Normal gait.   Assessment/Plan:  Memory loss S: with sister and other family member at visit today. All report about 4 months of issues. Patient thinks they are slightly worsening. Declines issues with memory prior to that. Patient forgets names or why she went into a room. Already had some workup for reversible causes of dementia with cbc, cmet with no findings. PHQ9 elevated in 10-13 range due to stress of son living with her when he should be providing for himself. She has declined making changes to improve situation at home out ot love for son. Has declined seeing  therapist. Family members encouraging her to have him move out and she declines as well.  A/P: MMSE today 23/30 (with 3 points loss for distratected recall, 3 for attention, and 1 for copying design). I suspect this may represent MCI vs. Alzheimers. Many risk factors for cva but no obvious stepwise decline. No obvious changes on exam to suggest parkinsons.  Neuro exam normal. We will do additional workup for Reversible causes of dementia- RPR, HIV, TSH, B12,. We considered MRI but patient/family want to wait until next vsiit to decide so we opted for 3 month follow up for repeat MMSE and consideration of MRI as well as aricept consideration. We also need to discuss in unrelated matters her last bone density test at that time. We discussed stress/dementia as potentially reversible cause but she is not ready to make any changes on this front. No SI/HI    Return in about 3 months (around 07/12/2016) for repeat memory testing. consideration of MMRI and medication. Return precautions advised.   Orders Placed This Encounter  Procedures  . HIV antibody  . RPR    [facility]  . TSH    [place]  . Vitamin B12   The duration of face-to-face time during this visit was 30 minutes. Greater than 50% of this time was spent in counseling, explanation of diagnosis, planning of further management, and/or coordination of care.    Tana Conch, MD

## 2016-04-11 NOTE — Patient Instructions (Addendum)
A few additional labs before you leave to look for reversible cauess of memory loss  Stress/depression may be a cause- working toward reducing stress in your life and resolving issue with your son could be helpful  Medicine we are considering:  Donepezil tablets What is this medicine? DONEPEZIL (doe NEP e zil) is used to treat mild to moderate dementia caused by Alzheimer's disease. This medicine may be used for other purposes; ask your health care provider or pharmacist if you have questions. What should I tell my health care provider before I take this medicine? They need to know if you have any of these conditions: -asthma or other lung disease -difficulty passing urine -head injury -heart disease -history of irregular heartbeat -liver disease -seizures (convulsions) -stomach or intestinal disease, ulcers or stomach bleeding -an unusual or allergic reaction to donepezil, other medicines, foods, dyes, or preservatives -pregnant or trying to get pregnant -breast-feeding How should I use this medicine? Take this medicine by mouth with a glass of water. Follow the directions on the prescription label. You may take this medicine with or without food. Take this medicine at regular intervals. This medicine is usually taken before bedtime. Do not take it more often than directed. Continue to take your medicine even if you feel better. Do not stop taking except on your doctor's advice. If you are taking the 23 mg donepezil tablet, swallow it whole; do not cut, crush, or chew it. Talk to your pediatrician regarding the use of this medicine in children. Special care may be needed. Overdosage: If you think you have taken too much of this medicine contact a poison control center or emergency room at once. NOTE: This medicine is only for you. Do not share this medicine with others. What if I miss a dose? If you miss a dose, take it as soon as you can. If it is almost time for your next dose, take  only that dose, do not take double or extra doses. What may interact with this medicine? Do not take this medicine with any of the following medications: -certain medicines for fungal infections like itraconazole, fluconazole, posaconazole, and voriconazole -cisapride -dextromethorphan; quinidine -dofetilide -dronedarone -pimozide -quinidine -thioridazine -ziprasidone This medicine may also interact with the following medications: -antihistamines for allergy, cough and cold -atropine -bethanechol -carbamazepine -certain medicines for bladder problems like oxybutynin, tolterodine -certain medicines for Parkinson's disease like benztropine, trihexyphenidyl -certain medicines for stomach problems like dicyclomine, hyoscyamine -certain medicines for travel sickness like scopolamine -dexamethasone -ipratropium -NSAIDs, medicines for pain and inflammation, like ibuprofen or naproxen -other medicines for Alzheimer's disease -other medicines that prolong the QT interval (cause an abnormal heart rhythm) -phenobarbital -phenytoin -rifampin, rifabutin or rifapentine This list may not describe all possible interactions. Give your health care provider a list of all the medicines, herbs, non-prescription drugs, or dietary supplements you use. Also tell them if you smoke, drink alcohol, or use illegal drugs. Some items may interact with your medicine. What should I watch for while using this medicine? Visit your doctor or health care professional for regular checks on your progress. Check with your doctor or health care professional if your symptoms do not get better or if they get worse. You may get drowsy or dizzy. Do not drive, use machinery, or do anything that needs mental alertness until you know how this drug affects you. What side effects may I notice from receiving this medicine? Side effects that you should report to your doctor or health care professional as soon  as possible: -allergic  reactions like skin rash, itching or hives, swelling of the face, lips, or tongue -changes in vision -feeling faint or lightheaded, falls -problems with balance -redness, blistering, peeling or loosening of the skin, including inside the mouth -slow heartbeat, or palpitations -stomach pain -unusual bleeding or bruising, red or purple spots on the skin -vomiting -weight loss Side effects that usually do not require medical attention (report to your doctor or health care professional if they continue or are bothersome): -diarrhea, especially when starting treatment -headache -indigestion or heartburn -loss of appetite -muscle cramps -nausea This list may not describe all possible side effects. Call your doctor for medical advice about side effects. You may report side effects to FDA at 1-800-FDA-1088. Where should I keep my medicine? Keep out of reach of children. Store at room temperature between 15 and 30 degrees C (59 and 86 degrees F). Throw away any unused medicine after the expiration date. NOTE: This sheet is a summary. It may not cover all possible information. If you have questions about this medicine, talk to your doctor, pharmacist, or health care provider.    2016, [employer]vier/[employer] Standard. (2014-06-30 07:51:52)

## 2016-04-11 NOTE — Assessment & Plan Note (Addendum)
S: with sister and other family member at visit today. All report about 4 months of issues. Patient thinks they are slightly worsening. Declines issues with memory prior to that. Patient forgets names or why she went into a room. Already had some workup for reversible causes of dementia with cbc, cmet with no findings. PHQ9 elevated in 10-13 range due to stress of son living with her when he should be providing for himself. She has declined making changes to improve situation at home out ot love for son. Has declined seeing therapist. Family members encouraging her to have him move out and she declines as well.  A/P: MMSE today 23/30 (with 3 points loss for distratected recall, 3 for attention, and 1 for copying design). I suspect this may represent MCI vs. Alzheimers. Many risk factors for cva but no obvious stepwise decline. No obvious changes on exam to suggest parkinsons.  Neuro exam normal. We will do additional workup for Reversible causes of dementia- RPR, HIV, TSH, B12,. We considered MRI but patient/family want to wait until next vsiit to decide so we opted for 3 month follow up for repeat MMSE and consideration of MRI as well as aricept consideration. We also need to discuss in unrelated matters her last bone density test at that time. We discussed stress/dementia as potentially reversible cause but she is not ready to make any changes on this front. No SI/HI

## 2016-04-12 LAB — HIV ANTIBODY (ROUTINE TESTING W REFLEX): HIV 1&2 Ab, 4th Generation: NONREACTIVE

## 2016-04-12 LAB — RPR

## 2016-04-23 ENCOUNTER — Other Ambulatory Visit: Payer: Self-pay | Admitting: Cardiovascular Disease

## 2016-04-23 DIAGNOSIS — I739 Peripheral vascular disease, unspecified: Secondary | ICD-10-CM

## 2016-04-29 ENCOUNTER — Emergency Department (HOSPITAL_COMMUNITY)
Admission: EM | Admit: 2016-04-29 | Discharge: 2016-04-29 | Disposition: A | Discharge status: Home / Self Care | Payer: Medicare Other | Attending: Emergency Medicine | Admitting: Emergency Medicine

## 2016-04-29 ENCOUNTER — Encounter (HOSPITAL_COMMUNITY): Payer: Self-pay | Admitting: *Deleted

## 2016-04-29 ENCOUNTER — Emergency Department (HOSPITAL_COMMUNITY): Payer: Medicare Other

## 2016-04-29 DIAGNOSIS — Z8719 Personal history of other diseases of the digestive system: Secondary | ICD-10-CM | POA: Diagnosis not present

## 2016-04-29 DIAGNOSIS — I10 Essential (primary) hypertension: Secondary | ICD-10-CM | POA: Diagnosis not present

## 2016-04-29 DIAGNOSIS — M19011 Primary osteoarthritis, right shoulder: Secondary | ICD-10-CM | POA: Insufficient documentation

## 2016-04-29 DIAGNOSIS — Z7982 Long term (current) use of aspirin: Secondary | ICD-10-CM | POA: Insufficient documentation

## 2016-04-29 DIAGNOSIS — Z862 Personal history of diseases of the blood and blood-forming organs and certain disorders involving the immune mechanism: Secondary | ICD-10-CM | POA: Diagnosis not present

## 2016-04-29 DIAGNOSIS — Z7902 Long term (current) use of antithrombotics/antiplatelets: Secondary | ICD-10-CM | POA: Diagnosis not present

## 2016-04-29 DIAGNOSIS — Z872 Personal history of diseases of the skin and subcutaneous tissue: Secondary | ICD-10-CM | POA: Diagnosis not present

## 2016-04-29 DIAGNOSIS — R079 Chest pain, unspecified: Secondary | ICD-10-CM | POA: Diagnosis not present

## 2016-04-29 DIAGNOSIS — Z8639 Personal history of other endocrine, nutritional and metabolic disease: Secondary | ICD-10-CM | POA: Insufficient documentation

## 2016-04-29 DIAGNOSIS — M19012 Primary osteoarthritis, left shoulder: Secondary | ICD-10-CM | POA: Insufficient documentation

## 2016-04-29 DIAGNOSIS — R0789 Other chest pain: Secondary | ICD-10-CM | POA: Diagnosis not present

## 2016-04-29 DIAGNOSIS — G8929 Other chronic pain: Secondary | ICD-10-CM | POA: Insufficient documentation

## 2016-04-29 DIAGNOSIS — Z79899 Other long term (current) drug therapy: Secondary | ICD-10-CM | POA: Insufficient documentation

## 2016-04-29 DIAGNOSIS — Z87891 Personal history of nicotine dependence: Secondary | ICD-10-CM | POA: Diagnosis not present

## 2016-04-29 DIAGNOSIS — R072 Precordial pain: Secondary | ICD-10-CM | POA: Diagnosis not present

## 2016-04-29 DIAGNOSIS — R1013 Epigastric pain: Secondary | ICD-10-CM | POA: Diagnosis not present

## 2016-04-29 LAB — CBC WITH DIFFERENTIAL/PLATELET
Basophils Absolute: 0.1 10*3/uL (ref 0.0–0.1)
Basophils Relative: 1 %
Eosinophils Absolute: 0.4 10*3/uL (ref 0.0–0.7)
Eosinophils Relative: 5 %
HCT: 32.3 % — ABNORMAL LOW (ref 36.0–46.0)
Hemoglobin: 10.1 g/dL — ABNORMAL LOW (ref 12.0–15.0)
Lymphocytes Relative: 21 %
Lymphs Abs: 1.7 10*3/uL (ref 0.7–4.0)
MCH: 27.2 pg (ref 26.0–34.0)
MCHC: 31.3 g/dL (ref 30.0–36.0)
MCV: 86.8 fL (ref 78.0–100.0)
Monocytes Absolute: 0.6 10*3/uL (ref 0.1–1.0)
Monocytes Relative: 7 %
Neutro Abs: 5.2 10*3/uL (ref 1.7–7.7)
Neutrophils Relative %: 66 %
Platelets: 255 10*3/uL (ref 150–400)
RBC: 3.72 MIL/uL — ABNORMAL LOW (ref 3.87–5.11)
RDW: 13.4 % (ref 11.5–15.5)
WBC: 7.8 10*3/uL (ref 4.0–10.5)

## 2016-04-29 LAB — COMPREHENSIVE METABOLIC PANEL
ALT: 21 U/L (ref 14–54)
AST: 40 U/L (ref 15–41)
Albumin: 3.3 g/dL — ABNORMAL LOW (ref 3.5–5.0)
Alkaline Phosphatase: 119 U/L (ref 38–126)
Anion gap: 7 (ref 5–15)
BUN: 16 mg/dL (ref 6–20)
CO2: 27 mmol/L (ref 22–32)
Calcium: 8.8 mg/dL — ABNORMAL LOW (ref 8.9–10.3)
Chloride: 104 mmol/L (ref 101–111)
Creatinine, Ser: 0.78 mg/dL (ref 0.44–1.00)
GFR calc Af Amer: >60 mL/min (ref >60)
GFR calc non Af Amer: >60 mL/min (ref >60)
Glucose, Bld: 134 mg/dL — ABNORMAL HIGH (ref 65–99)
Potassium: 4 mmol/L (ref 3.5–5.1)
Sodium: 138 mmol/L (ref 135–145)
Total Bilirubin: 0.6 mg/dL (ref 0.3–1.2)
Total Protein: 6.7 g/dL (ref 6.5–8.1)

## 2016-04-29 LAB — I-STAT TROPONIN, ED
Troponin i, poc: 0 ng/mL (ref 0.00–0.08)
Troponin i, poc: 0 ng/mL (ref 0.00–0.08)

## 2016-04-29 NOTE — ED Notes (Signed)
Pt is in stable condition upon d/c and ambulates from ED. 

## 2016-04-29 NOTE — Discharge Instructions (Signed)
Take a medication such as Nexium or Prevacid daily for 1 month. Also use Maalox before meals and at bedtime, for 1 week. Return here if needed, for problems.   Nonspecific Chest Pain  Chest pain can be caused by many different conditions. There is always a chance that your pain could be related to something serious, such as a heart attack or a blood clot in your lungs. Chest pain can also be caused by conditions that are not life-threatening. If you have chest pain, it is very important to follow up with your health care provider. CAUSES  Chest pain can be caused by:  Heartburn.  Pneumonia or bronchitis.  Anxiety or stress.  Inflammation around your heart (pericarditis) or lung (pleuritis or pleurisy).  A blood clot in your lung.  A collapsed lung (pneumothorax). It can develop suddenly on its own (spontaneous pneumothorax) or from trauma to the chest.  Shingles infection (varicella-zoster virus).  Heart attack.  Damage to the bones, muscles, and cartilage that make up your chest wall. This can include:  Bruised bones due to injury.  Strained muscles or cartilage due to frequent or repeated coughing or overwork.  Fracture to one or more ribs.  Sore cartilage due to inflammation (costochondritis). RISK FACTORS  Risk factors for chest pain may include:  Activities that increase your risk for trauma or injury to your chest.  Respiratory infections or conditions that cause frequent coughing.  Medical conditions or overeating that can cause heartburn.  Heart disease or family history of heart disease.  Conditions or health behaviors that increase your risk of developing a blood clot.  Having had chicken pox (varicella zoster). SIGNS AND SYMPTOMS Chest pain can feel like:  Burning or tingling on the surface of your chest or deep in your chest.  Crushing, pressure, aching, or squeezing pain.  Dull or sharp pain that is worse when you move, cough, or take a deep  breath.  Pain that is also felt in your back, neck, shoulder, or arm, or pain that spreads to any of these areas. Your chest pain may come and go, or it may stay constant. DIAGNOSIS Lab tests or other studies may be needed to find the cause of your pain. Your health care provider may have you take a test called an ambulatory ECG (electrocardiogram). An ECG records your heartbeat patterns at the time the test is performed. You may also have other tests, such as:  Transthoracic echocardiogram (TTE). During echocardiography, sound waves are used to create a picture of all of the heart structures and to look at how blood flows through your heart.  Transesophageal echocardiogram (TEE).This is a more advanced imaging test that obtains images from inside your body. It allows your health care provider to see your heart in finer detail.  Cardiac monitoring. This allows your health care provider to monitor your heart rate and rhythm in real time.  Holter monitor. This is a portable device that records your heartbeat and can help to diagnose abnormal heartbeats. It allows your health care provider to track your heart activity for several days, if needed.  Stress tests. These can be done through exercise or by taking medicine that makes your heart beat more quickly.  Blood tests.  Imaging tests. TREATMENT  Your treatment depends on what is causing your chest pain. Treatment may include:  Medicines. These may include:  Acid blockers for heartburn.  Anti-inflammatory medicine.  Pain medicine for inflammatory conditions.  Antibiotic medicine, if an infection is present.  Medicines to dissolve blood clots.  Medicines to treat coronary artery disease.  Supportive care for conditions that do not require medicines. This may include:  Resting.  Applying heat or cold packs to injured areas.  Limiting activities until pain decreases. HOME CARE INSTRUCTIONS  If you were prescribed an  antibiotic medicine, finish it all even if you start to feel better.  Avoid any activities that bring on chest pain.  Do not use any tobacco products, including cigarettes, chewing tobacco, or electronic cigarettes. If you need help quitting, ask your health care provider.  Do not drink alcohol.  Take medicines only as directed by your health care provider.  Keep all follow-up visits as directed by your health care provider. This is important. This includes any further testing if your chest pain does not go away.  If heartburn is the cause for your chest pain, you may be told to keep your head raised (elevated) while sleeping. This reduces the chance that acid will go from your stomach into your esophagus.  Make lifestyle changes as directed by your health care provider. These may include:  Getting regular exercise. Ask your health care provider to suggest some activities that are safe for you.  Eating a heart-healthy diet. A registered dietitian can help you to learn healthy eating options.  Maintaining a healthy weight.  Managing diabetes, if necessary.  Reducing stress. SEEK MEDICAL CARE IF:  Your chest pain does not go away after treatment.  You have a rash with blisters on your chest.  You have a fever. SEEK IMMEDIATE MEDICAL CARE IF:   Your chest pain is worse.  You have an increasing cough, or you cough up blood.  You have severe abdominal pain.  You have severe weakness.  You faint.  You have chills.  You have sudden, unexplained chest discomfort.  You have sudden, unexplained discomfort in your arms, back, neck, or jaw.  You have shortness of breath at any time.  You suddenly start to sweat, or your skin gets clammy.  You feel nauseous or you vomit.  You suddenly feel light-headed or dizzy.  Your heart begins to beat quickly, or it feels like it is skipping beats. These symptoms may represent a serious problem that is an emergency. Do not wait to  see if the symptoms will go away. Get medical help right away. Call your local emergency services (911 in the [place] Do not drive yourself to the hospital.   This information is not intended to replace advice given to you by your health care provider. Make sure you discuss any questions you have with your health care provider.   Document Released: 08/28/2005 Document Revised: 12/09/2014 Document Reviewed: 06/24/2014 Elsevier Interactive Patient Education [employer].

## 2016-04-29 NOTE — ED Notes (Signed)
Pt states she had an episode of epigastric pain while at the store accompanied by nausea, diaphoresis, dizziness and sob.  Pt states her pain lasted about 2 hours and resolved on its own. Pt is pain free upon arrival to ED.

## 2016-04-29 NOTE — ED Provider Notes (Signed)
CSN: [id number]     Arrival date & time 04/29/16  1236 History   First MD Initiated Contact with Patient 04/29/16 1302     Chief Complaint  Patient presents with  . Abdominal Pain     (Consider location/radiation/quality/duration/timing/severity/associated sxs/prior Treatment) HPI   Cheryl Atkinson is a 82 y.o. female who presents for evaluation of "indigestion" felt in the center lower chest, while walking into a hardware store today. She had to sit down because she felt like she would pass out. Her son states that she had slurred speech for 5 minutes. She noticed that she was sweating. The discomfort lasted for 2 hours. Her son took her home, and then called an ambulance for transport. She is not treated, by EMS during transport. No prior similar problem such as this. No history of cardiac disease. She has had peripheral vascular stenting, done. No recent illnesses, including shortness of breath, headache, paresthesias or focal weakness. There are no other known modifying factors.   Past Medical History  Diagnosis Date  . Thyroid disease   . Restless leg syndrome   . Osteoporosis   . Constipation   . Anemia   . Hypertension   . Cellulitis of right lower extremity 11/27/2013  . Peripheral arterial disease (HCC)   . GERD (gastroesophageal reflux disease)   . Arthritis     "shoulders" (05/04/2015)  . Chronic lower back pain   . History of hiatal hernia    Past Surgical History  Procedure Laterality Date  . Appendectomy    . Tonsillectomy    . Cataract extraction w/ intraocular lens  implant, bilateral Bilateral   . Peripheral vascular catheterization N/A 05/04/2015    Procedure: Lower Extremity Angiography;  Surgeon: Runell Gess, MD;  Location: [facility] INVASIVE CV LAB;  Service: Cardiovascular;  Laterality: N/A;  . Abdominal aortagram  05/04/2015    Procedure: Abdominal Aortagram;  Surgeon: Runell Gess, MD;  Location: [facility] INVASIVE CV LAB;  Service: Cardiovascular;;  . Peripheral  vascular catheterization  05/04/2015    Procedure: Peripheral Vascular Intervention;  Surgeon: Runell Gess, MD;  Location: [facility] INVASIVE CV LAB;  Service: Cardiovascular;;  RCIA - 7x22 ICAST  . Vaginal hysterectomy    . Thyroid surgery Right ?2013    "had goiter taken off my neck"  . Sfa Right 09/04/2015    de balloon  . Peripheral vascular catheterization Right 09/04/2015    Procedure: Peripheral Vascular Atherectomy;  Surgeon: Runell Gess, MD;  Location: [facility] INVASIVE CV LAB;  Service: Cardiovascular;  Laterality: Right;  SFA   Family History  Problem Relation Age of Onset  . Coronary artery disease    . Heart disease Father    Social History  Substance Use Topics  . Smoking status: Former Smoker -- 0.50 packs/day for 60 years    Types: Cigarettes    Quit date: 04/02/2015  . Smokeless tobacco: Never Used  . Alcohol Use: No   OB History    No data available     Review of Systems  All other systems reviewed and are negative.     Allergies  Codeine  Home Medications   Prior to Admission medications   Medication Sig Start Date End Date Taking? Authorizing Provider  aspirin EC 81 MG EC tablet Take 1 tablet (81 mg total) by mouth daily. 05/05/15  Yes Luke K Kilroy, PA-C  atorvastatin (LIPITOR) 40 MG tablet TAKE 1 TABLET DAILY AT 6 P.M. Patient taking differently: TAKE 1 TABLET daily  at 6 PM 04/10/16  Yes Runell GessJonathan J Berry, MD  clopidogrel (PLAVIX) 75 MG tablet TAKE 1 TABLET DAILY WITH BREAKFAST 04/10/16  Yes Runell GessJonathan J Berry, MD  Multiple Vitamins-Minerals (PRESERVISION AREDS 2 PO) Take 1 tablet by mouth 2 (two) times daily.   Yes Historical Provider, MD  NEUPRO 2 MG/24HR APPLY 1 PATCH ONTO THE SKIN DAILY Patient taking differently: APPLY 1 PATCH ONTO THE SKIN before bedtime 11/07/15  Yes Shelva MajesticStephen O Hunter, MD  Olmesartan-Amlodipine-HCTZ [facility](TRIBENZOR) 40-10-12.5 MG TABS Take 1 tablet by mouth every morning.    Yes Historical Provider, MD  polyethylene glycol (MIRALAX / GLYCOLAX)  packet Take 17 g by mouth at bedtime as needed for mild constipation. Reported on 03/14/2016   Yes Historical Provider, MD  traMADol (ULTRAM) 50 MG tablet Take 50 mg by mouth at bedtime as needed for moderate pain. Reported on 04/11/2016   Yes Historical Provider, MD  traMADol (ULTRAM-ER) 300 MG 24 hr tablet Take 300 mg by mouth daily. Reported on 04/11/2016   Yes Historical Provider, MD   BP 127/58 mmHg  Pulse 68  Temp(Src) 98.3 F (36.8 C) (Oral)  Resp 19  SpO2 96% Physical Exam  Constitutional: She is oriented to person, place, and time. She appears well-developed. No distress.  Elderly, frail  HENT:  Head: Normocephalic and atraumatic.  Right Ear: External ear normal.  Left Ear: External ear normal.  Eyes: Conjunctivae and EOM are normal. Pupils are equal, round, and reactive to light.  Neck: Normal range of motion and phonation normal. Neck supple.  Cardiovascular: Normal rate, regular rhythm and normal heart sounds.   Pulmonary/Chest: Effort normal and breath sounds normal. She exhibits no bony tenderness.  Abdominal: Soft. There is no tenderness.  Musculoskeletal: Normal range of motion.  Neurological: She is alert and oriented to person, place, and time. No cranial nerve deficit or sensory deficit. She exhibits normal muscle tone. Coordination normal.  No dysarthria and aphasia or nystagmus.  Skin: Skin is warm, dry and intact.  Psychiatric: She has a normal mood and affect. Her behavior is normal. Judgment and thought content normal.  Nursing note and vitals reviewed.   ED Course  Procedures (including critical care time)  Medications - No data to display  Patient Vitals for the past 24 hrs:  BP Temp Temp src Pulse Resp SpO2  04/29/16 1630 127/58 mmHg - - 68 19 96 %  04/29/16 1615 118/66 mmHg - - 66 22 97 %  04/29/16 1600 132/72 mmHg - - 67 20 97 %  04/29/16 1545 133/64 mmHg - - 67 18 96 %  04/29/16 1530 146/56 mmHg - - 78 21 92 %  04/29/16 1500 149/68 mmHg - - (!) 36  18 91 %  04/29/16 1456 114/65 mmHg - - - - -  04/29/16 1448 - - - 67 20 97 %  04/29/16 1430 135/73 mmHg - - 69 16 97 %  04/29/16 1415 129/64 mmHg - - 69 16 97 %  04/29/16 1410 138/58 mmHg - - 67 20 95 %  04/29/16 1246 (!) 135/54 mmHg 98.3 F (36.8 C) Oral 70 20 98 %  04/29/16 1245 (!) 135/54 mmHg - - 72 16 99 %  04/29/16 1238 - - - - - 96 %    5:14 PM Reevaluation with update and discussion. After initial assessment and treatment, an updated evaluation reveals She remains comfortable and denies pain at this time. Repeat vital signs are reassuring. Findings discussed with patient and family members, all questions  answered. Mardie Kellen L   Labs Review Labs Reviewed  CBC WITH DIFFERENTIAL/PLATELET - Abnormal; Notable for the following:    RBC 3.72 (*)    Hemoglobin 10.1 (*)    HCT 32.3 (*)    All other components within normal limits  COMPREHENSIVE METABOLIC PANEL - Abnormal; Notable for the following:    Glucose, Bld 134 (*)    Calcium 8.8 (*)    Albumin 3.3 (*)    All other components within normal limits  I-STAT TROPOININ, ED  Rosezena Sensor, ED    Imaging Review Dg Chest 2 View  04/29/2016  CLINICAL DATA:  Pain since this am in sternum EXAM: CHEST  2 VIEW COMPARISON:  None. FINDINGS: Mild hyperinflation. Right humeral lesion is likely an enchondroma aand has been present back to 2009. Midline trachea. Normal heart size and mediastinal contours for age. No pleural effusion or pneumothorax. Mild left base scarring appear IMPRESSION: No acute cardiopulmonary disease. Hyperinflation with mild left base scarring. Electronically Signed   By: Jeronimo Greaves M.D.   On: 04/29/2016 13:28   I have personally reviewed and evaluated these images and lab results as part of my medical decision-making.   EKG Interpretation   Date/Time:  Monday Apr 29 2016 12:44:45 EDT Ventricular Rate:  72 PR Interval:  179 QRS Duration: 107 QT Interval:  422 QTC Calculation: 462 R Axis:     Text  Interpretation:  Sinus rhythm Inferior infarct, old since last  tracing no significant change Confirmed by [facility]  MD, Monti Jilek ([id number]) on  04/29/2016 2:38:17 PM      MDM   Final diagnoses:  Nonspecific chest pain    Transient chest pain, likely related to heartburn. Doubt ACS, PE or pneumonia.  Nursing Notes Reviewed/ Care Coordinated Applicable Imaging Reviewed Interpretation of Laboratory Data incorporated into ED treatment  The patient appears reasonably screened and/or stabilized for discharge and I doubt any other medical condition or other [facility] requiring further screening, evaluation, or treatment in the ED at this time prior to discharge.  Plan: Home Medications- OTC PPI x 1 month and Antacid for 1 week AC/HS; Home Treatments- rest; return here if the recommended treatment, does not improve the symptoms; Recommended follow up- PCP 1 week and prn     Mancel Bale, MD 04/29/16 [phone]

## 2016-05-03 ENCOUNTER — Other Ambulatory Visit: Payer: Self-pay | Admitting: Family Medicine

## 2016-05-10 ENCOUNTER — Ambulatory Visit (INDEPENDENT_AMBULATORY_CARE_PROVIDER_SITE_OTHER): Payer: Medicare Other | Admitting: Family Medicine

## 2016-05-10 ENCOUNTER — Encounter: Payer: Self-pay | Admitting: Family Medicine

## 2016-05-10 DIAGNOSIS — K219 Gastro-esophageal reflux disease without esophagitis: Secondary | ICD-10-CM

## 2016-05-10 NOTE — Progress Notes (Signed)
Pre visit review using our clinic review tool, if applicable. No additional management support is needed unless otherwise documented below in the visit note. 

## 2016-05-10 NOTE — Patient Instructions (Signed)
This does appear to have been reflux. Continue your medicine if you have recurrence of pain while on medicine- need to seek care again  No changes in medications  Glad you are doing better

## 2016-05-11 NOTE — Assessment & Plan Note (Signed)
Chest pain likely due to GERD S: Patient presented to ED on 04/29/16 for chest pain. She states she had a pretty big sausage and gravy biscuit then went out to the store. When she arrived in the store she noted an intense burning in her lower chest/epigastric area. She felt like "it would go away if I could have just burped". She sat down as she felt somewhat lightheaded. Son noted slurred speech for 5 minutes but patient firmly denies this. She was sweating some. Symptoms lasted 2 hours- she found a tums at home and when she took this symptoms resolved. She states she has felt like this in past with reflux. She usually is ok if she avoids spicy foods and larger meals. SHe takes nexium intermittently only if symptoms but stated she hadnt taken in days before this situation. Since episode she picked up some omeprazole 20mg  otc and started taking- no recurrence of symptoms on this to this point. A/P: 82 year old female who presented to ED with classic symptoms of GERD for her that resolved with tums before arrival to ED. She had reassuring ED work up with unchanged EKG and normal troponin level. She was deemed safe for discharge with PCP follow up. We agreed at this point as long as no recurrence on PPI to not pursue cardiac workup but if does recur- seek care immediately. She is already plugged in with Dr. Allyson SabalBerry due to PAD. Patient and I both aware she is high risk for cardiac issues with PAD and renal artery stenosis history. She agrees to monitor for symptoms carefully.

## 2016-05-11 NOTE — Progress Notes (Signed)
Subjective:  Cheryl Atkinson is a 82 y.o. year old very pleasant female patient who presents for/with See problem oriented charting ROS- since before arriving at ED_ no chest pain, increased SOB, diaphoresis, left arm or neck pain, dizziness. Denied preceeding illness, cough. No facial or extremity weakness. No slurred words or trouble swallowing. no blurry vision or double vision. No paresthesias. No confusion or word finding difficulties. see any ROS included in HPI as well.   Past Medical History-  Patient Active Problem List   Diagnosis Date Noted  . Memory loss 04/11/2016    Priority: High  . Renal artery stenosis (HCC) 09/27/2015    Priority: High  . Atherosclerotic PVD with intermittent claudication (HCC) 04/26/2015    Priority: High  . DNR (do not resuscitate) 09/29/2014    Priority: High  . Hyperlipidemia 06/30/2015    Priority: Medium  . Former smoker 09/29/2014    Priority: Medium  . Hyperglycemia 07/19/2014    Priority: Medium  . COPD (chronic obstructive pulmonary disease) (HCC) 09/20/2009    Priority: Medium  . Iron deficiency anemia 11/09/2007    Priority: Medium  . RESTLESS LEG SYNDROME 09/25/2007    Priority: Medium  . Essential hypertension 09/25/2007    Priority: Medium  . GERD 09/25/2007    Priority: Medium  . LOW BACK PAIN 09/25/2007    Priority: Medium  . Osteoporosis 09/25/2007    Priority: Medium  . Mallet toe of right foot 10/20/2015    Priority: Low  . Tinnitus 12/31/2014    Priority: Low  . Multinodular goiter 08/30/2013    Priority: Low  . Spinal stenosis of lumbar region at multiple levels 09/29/2012    Priority: Low  . HIP PAIN, BILATERAL 07/16/2010    Priority: Low  . CONSTIPATION, CHRONIC 09/20/2009    Priority: Low  . History of UTI 11/09/2007    Priority: Low  . Claudication (HCC) 05/04/2015    Medications- reviewed and updated Current Outpatient Prescriptions  Medication Sig Dispense Refill  . aspirin EC 81 MG EC tablet Take 1  tablet (81 mg total) by mouth daily.    Marland Kitchen atorvastatin (LIPITOR) 40 MG tablet TAKE 1 TABLET DAILY AT 6 P.M. (Patient taking differently: TAKE 1 TABLET daily at 6 PM) 90 tablet 0  . clopidogrel (PLAVIX) 75 MG tablet TAKE 1 TABLET DAILY WITH BREAKFAST 90 tablet 0  . Multiple Vitamins-Minerals (PRESERVISION AREDS 2 PO) Take 1 tablet by mouth 2 (two) times daily.    Marland Kitchen NEUPRO 2 MG/24HR APPLY 1 PATCH TO SKIN DAILY 90 patch 1  . Olmesartan-Amlodipine-HCTZ (TRIBENZOR) 40-10-12.5 MG TABS Take 1 tablet by mouth every morning.     . polyethylene glycol (MIRALAX / GLYCOLAX) packet Take 17 g by mouth at bedtime as needed for mild constipation. Reported on 03/14/2016    . traMADol (ULTRAM) 50 MG tablet Take 50 mg by mouth at bedtime as needed for moderate pain. Reported on 04/11/2016    . traMADol (ULTRAM-ER) 300 MG 24 hr tablet Take 300 mg by mouth daily. Reported on 04/11/2016     No current facility-administered medications for this visit.    Objective: BP 134/72 mmHg  Pulse 73  Temp(Src) 98.4 F (36.9 C) (Oral)  Ht  (1.676 m)  Wt 168 lb (76.204 kg)  BMI 27.13 kg/m2  SpO2 96% Gen: NAD, resting comfortably in chair, appears stated age Mucous membranes are moist. Oropharynx normal CV: RRR no murmurs rubs or gallops No chest wall pain Lungs: CTAB no crackles,  wheeze, rhonchi Abdomen: soft/nontender/nondistended/normal bowel sounds.  Ext: no edema Skin: warm, dry, no rash  Assessment/Plan:  GERD Chest pain likely due to GERD S: Patient presented to ED on 04/29/16 for chest pain. She states she had a pretty big sausage and gravy biscuit then went out to the store. When she arrived in the store she noted an intense burning in her lower chest/epigastric area. She felt like "it would go away if I could have just burped". She sat down as she felt somewhat lightheaded. Son noted slurred speech for 5 minutes but patient firmly denies this. She was sweating some. Symptoms lasted 2 hours- she found a  tums at home and when she took this symptoms resolved. She states she has felt like this in past with reflux. She usually is ok if she avoids spicy foods and larger meals. SHe takes nexium intermittently only if symptoms but stated she hadnt taken in days before this situation. Since episode she picked up some omeprazole 20mg  otc and started taking- no recurrence of symptoms on this to this point. A/P: 82 year old female who presented to ED with classic symptoms of GERD for her that resolved with tums before arrival to ED. She had reassuring ED work up with unchanged EKG and normal troponin level. She was deemed safe for discharge with PCP follow up. We agreed at this point as long as no recurrence on PPI to not pursue cardiac workup but if does recur- seek care immediately. She is already plugged in with Dr. Allyson SabalBerry due to PAD. Patient and I both aware she is high risk for cardiac issues with PAD and renal artery stenosis history. She agrees to monitor for symptoms carefully.    Return precautions advised.   Cheryl ConchStephen Hunter, MD

## 2016-05-14 ENCOUNTER — Ambulatory Visit (HOSPITAL_COMMUNITY)
Admission: RE | Admit: 2016-05-14 | Discharge: 2016-05-14 | Disposition: A | Discharge status: Home / Self Care | Payer: Medicare Other | Source: Ambulatory Visit | Attending: Cardiovascular Disease | Admitting: Cardiovascular Disease

## 2016-05-14 ENCOUNTER — Telehealth: Payer: Self-pay | Admitting: *Deleted

## 2016-05-14 ENCOUNTER — Other Ambulatory Visit: Payer: Self-pay | Admitting: Cardiovascular Disease

## 2016-05-14 DIAGNOSIS — I10 Essential (primary) hypertension: Secondary | ICD-10-CM | POA: Insufficient documentation

## 2016-05-14 DIAGNOSIS — I700 Atherosclerosis of aorta: Secondary | ICD-10-CM | POA: Insufficient documentation

## 2016-05-14 DIAGNOSIS — R938 Abnormal findings on diagnostic imaging of other specified body structures: Secondary | ICD-10-CM | POA: Diagnosis not present

## 2016-05-14 DIAGNOSIS — K219 Gastro-esophageal reflux disease without esophagitis: Secondary | ICD-10-CM | POA: Insufficient documentation

## 2016-05-14 DIAGNOSIS — I739 Peripheral vascular disease, unspecified: Secondary | ICD-10-CM

## 2016-05-14 DIAGNOSIS — I714 Abdominal aortic aneurysm, without rupture: Secondary | ICD-10-CM | POA: Diagnosis not present

## 2016-05-14 DIAGNOSIS — I708 Atherosclerosis of other arteries: Secondary | ICD-10-CM | POA: Diagnosis not present

## 2016-05-14 NOTE — Telephone Encounter (Signed)
-----   Message from Runell GessJonathan J Berry, MD sent at 05/14/2016  3:50 PM EDT ----- Regarding: RE: Patient results Needs ROV with me. Can you have Victorino DikeJennifer arrange?  JJB ----- Message -----    From: Jodelle GrossNicole M Chapdelaine    Sent: 05/14/2016  11:21 AM      To: Stann MainlandJennifer O Clark, RN, Runell GessJonathan J Berry, MD Subject: Patient results                                Patient is complains of new claudication symptoms.   6/16 - right common iliac artery stent 10/16 - right superficial femoral artery atherectomy. 10/16 - Arterial Doppler performed. Right ABI was .82 and left ABI was .63.   Today's ABI's have decreased, bilaterally.  Right ABI is .73 and left ABI is .54.   Duplex imaging showed bilateral >50% common iliac artery stenosis and 75-99%  right mid superficial femoral artery stenosis.

## 2016-05-14 NOTE — Telephone Encounter (Signed)
Patient called and appt scheduled for 05/22/16 at 11:00 to go over results.

## 2016-05-22 ENCOUNTER — Ambulatory Visit (INDEPENDENT_AMBULATORY_CARE_PROVIDER_SITE_OTHER): Payer: Medicare Other | Admitting: Cardiovascular Disease

## 2016-05-22 ENCOUNTER — Encounter: Payer: Self-pay | Admitting: Cardiovascular Disease

## 2016-05-22 VITALS — BP 130/60 | HR 66 | Ht 66.0 in | Wt 169.0 lb

## 2016-05-22 DIAGNOSIS — I701 Atherosclerosis of renal artery: Secondary | ICD-10-CM

## 2016-05-22 DIAGNOSIS — I10 Essential (primary) hypertension: Secondary | ICD-10-CM | POA: Diagnosis not present

## 2016-05-22 DIAGNOSIS — E785 Hyperlipidemia, unspecified: Secondary | ICD-10-CM | POA: Diagnosis not present

## 2016-05-22 DIAGNOSIS — I739 Peripheral vascular disease, unspecified: Secondary | ICD-10-CM

## 2016-05-22 DIAGNOSIS — I70219 Atherosclerosis of native arteries of extremities with intermittent claudication, unspecified extremity: Secondary | ICD-10-CM

## 2016-05-22 NOTE — Assessment & Plan Note (Signed)
History of hypertension with blood pressure measures 130/60 She is on olmesartan, amlodipine,and hydrochlorothiazide. Continue current meds at current dosing

## 2016-05-22 NOTE — Patient Instructions (Signed)

## 2016-05-22 NOTE — Assessment & Plan Note (Signed)
history of peripheral arterial disease with critical limb ischemia status post right common iliac artery stenting 05/04/15 with staged right SFA directional atherectomy and drug-eluting balloon angioplasty. Her wound on her right foot ultimately healed. She does have an occluded left SFA. The right ABI has fallen from 0.82 to .73 with the development of a high-frequency signal in her mid right SFA. She does have claudication although her back into more trouble and she does not wish to pursue any intervention at this time.

## 2016-05-22 NOTE — Assessment & Plan Note (Signed)
History of an occluded left renal artery determined at the time of angiography 05/04/15.

## 2016-05-22 NOTE — Assessment & Plan Note (Signed)
History of hyperlipidemia on statin therapy with lipid profile performed 06/30/15 revealed a total cholesterol 173, LDL of 81 and HDL of 79.

## 2016-05-22 NOTE — Progress Notes (Signed)
05/22/2016 Crissie ReeseBetty L Magos   Feb 06, 1931  [id number]  Primary Physician [name], MD Primary Cardiologist: Runell GessJonathan J Janelis Stelzer MD Roseanne RenoFACP, FACC, FAHA, FSCAI  HPI:  [name] is an 82 year old female requiring podiatry procedure on her right foot. She was sent to me by Dr. Ardelle AntonWagoner for claudication and peripheral vascular disease. I last saw her in the office 11/08/15. I stented her right common iliac artery back in June. She did have occluded SFAs bilaterally. She underwent staged right SFA directional atherectomy and drug-eluting balloon angioplasty on 09/04/15 with subsequent Dopplers that showed significant improvement in her right ABI. She no longer has claudication on that side. She does have a palpable pedal pulse. Her problems include discontinued tobacco abuse, hypertension and hyperlipidemia.since I saw her lastshe has had an episode of substernal chest pain and was evaluated in the emergency room. It was thought secondary to GERD. She has had Doppler studies of her lower extremities that show a decline in her right ABI with the development of a high-frequency signal in her mid right SFA although she says her back pain limits her more than her legs at this time.    Current Outpatient Prescriptions  Medication Sig Dispense Refill  . aspirin EC 81 MG EC tablet Take 1 tablet (81 mg total) by mouth daily.    Marland Kitchen. atorvastatin (LIPITOR) 40 MG tablet TAKE 1 TABLET DAILY AT 6 P.M. (Patient taking differently: TAKE 1 TABLET daily at 6 PM) 90 tablet 0  . clopidogrel (PLAVIX) 75 MG tablet TAKE 1 TABLET DAILY WITH BREAKFAST 90 tablet 0  . Multiple Vitamins-Minerals (PRESERVISION AREDS 2 PO) Take 1 tablet by mouth 2 (two) times daily.    Marland Kitchen. NEUPRO 2 MG/24HR APPLY 1 PATCH TO SKIN DAILY 90 patch 1  . Olmesartan-Amlodipine-HCTZ (TRIBENZOR) 40-10-12.5 MG TABS Take 1 tablet by mouth every morning.     . polyethylene glycol (MIRALAX / GLYCOLAX) packet Take 17 g by mouth at bedtime as needed for mild  constipation. Reported on 03/14/2016    . traMADol (ULTRAM) 50 MG tablet Take 50 mg by mouth at bedtime as needed for moderate pain. Reported on 04/11/2016    . traMADol (ULTRAM-ER) 300 MG 24 hr tablet Take 300 mg by mouth daily. Reported on 04/11/2016     No current facility-administered medications for this visit.    Allergies  Allergen Reactions  . Codeine Other (See Comments)    REACTION: Syncope     Social History   Social History  . Marital Status: Widowed    Spouse Name: N/A  . Number of Children: N/A  . Years of Education: N/A   Occupational History  . Not on file.   Social History Main Topics  . Smoking status: Former Smoker -- 0.50 packs/day for 60 years    Types: Cigarettes    Quit date: 04/02/2015  . Smokeless tobacco: Never Used  . Alcohol Use: No  . Drug Use: No  . Sexual Activity: No   Other Topics Concern  . Not on file   Social History Narrative   Widowed 2013. 2 sons. 1 grandchild.       Retired from [employer].       Hobbies: time at home and with family      HCPOA: sister-in-law and brother. Deirdre EvenerStephen Sumner.       DNR/DNI      Regular exercise: none   Caffeine use: cup of coffee      Review of Systems: General: negative  for chills, fever, night sweats or weight changes.  Cardiovascular: negative for chest pain, dyspnea on exertion, edema, orthopnea, palpitations, paroxysmal nocturnal dyspnea or shortness of breath Dermatological: negative for rash Respiratory: negative for cough or wheezing Urologic: negative for hematuria Abdominal: negative for nausea, vomiting, diarrhea, bright red blood per rectum, melena, or hematemesis Neurologic: negative for visual changes, syncope, or dizziness All other systems reviewed and are otherwise negative except as noted above.    Blood pressure 130/60, pulse 66, height  (1.676 m), weight 169 lb (76.658 kg).  General appearance: alert and no distress Neck: no adenopathy, no carotid bruit, no  JVD, supple, symmetrical, trachea midline and thyroid not enlarged, symmetric, no tenderness/mass/nodules Lungs: clear to auscultation bilaterally Heart: regular rate and rhythm, S1, S2 normal, no murmur, click, rub or gallop Extremities: extremities normal, atraumatic, no cyanosis or edema  EKG not performed at this time.  ASSESSMENT AND PLAN:   Essential hypertension History of hypertension with blood pressure measures 130/60 She is on olmesartan, amlodipine,and hydrochlorothiazide. Continue current meds at current dosing  Atherosclerotic PVD with intermittent claudication (HCC) history of peripheral arterial disease with critical limb ischemia status post right common iliac artery stenting 05/04/15 with staged right SFA directional atherectomy and drug-eluting balloon angioplasty. Her wound on her right foot ultimately healed. She does have an occluded left SFA. The right ABI has fallen from 0.82 to .73 with the development of a high-frequency signal in her mid right SFA. She does have claudication although her back into more trouble and she does not wish to pursue any intervention at this time.  Hyperlipidemia History of hyperlipidemia on statin therapy with lipid profile performed 06/30/15 revealed a total cholesterol 173, LDL of 81 and HDL of 79.  Renal artery stenosis (HCC) History of an occluded left renal artery determined at the time of angiography 05/04/15.      Runell Gess MD FACP,FACC,FAHA, [facility] 05/22/2016 11:32 AM

## 2016-07-04 DIAGNOSIS — M545 Low back pain: Secondary | ICD-10-CM | POA: Diagnosis not present

## 2016-07-04 DIAGNOSIS — G894 Chronic pain syndrome: Secondary | ICD-10-CM | POA: Diagnosis not present

## 2016-07-04 DIAGNOSIS — Z79891 Long term (current) use of opiate analgesic: Secondary | ICD-10-CM | POA: Diagnosis not present

## 2016-07-04 DIAGNOSIS — Z79899 Other long term (current) drug therapy: Secondary | ICD-10-CM | POA: Diagnosis not present

## 2016-07-04 DIAGNOSIS — M5136 Other intervertebral disc degeneration, lumbar region: Secondary | ICD-10-CM | POA: Diagnosis not present

## 2016-07-04 DIAGNOSIS — M47817 Spondylosis without myelopathy or radiculopathy, lumbosacral region: Secondary | ICD-10-CM | POA: Diagnosis not present

## 2016-07-08 ENCOUNTER — Ambulatory Visit (INDEPENDENT_AMBULATORY_CARE_PROVIDER_SITE_OTHER): Payer: Medicare Other | Admitting: Ophthalmology

## 2016-07-08 DIAGNOSIS — H35033 Hypertensive retinopathy, bilateral: Secondary | ICD-10-CM

## 2016-07-08 DIAGNOSIS — H43813 Vitreous degeneration, bilateral: Secondary | ICD-10-CM | POA: Diagnosis not present

## 2016-07-08 DIAGNOSIS — H43823 Vitreomacular adhesion, bilateral: Secondary | ICD-10-CM

## 2016-07-08 DIAGNOSIS — I10 Essential (primary) hypertension: Secondary | ICD-10-CM

## 2016-07-08 DIAGNOSIS — H353132 Nonexudative age-related macular degeneration, bilateral, intermediate dry stage: Secondary | ICD-10-CM

## 2016-07-09 ENCOUNTER — Other Ambulatory Visit: Payer: Self-pay | Admitting: Cardiovascular Disease

## 2016-07-09 NOTE — Telephone Encounter (Signed)
Rx(s) sent to pharmacy electronically.  

## 2016-07-12 ENCOUNTER — Encounter: Payer: Self-pay | Admitting: Family Medicine

## 2016-07-12 ENCOUNTER — Ambulatory Visit (INDEPENDENT_AMBULATORY_CARE_PROVIDER_SITE_OTHER): Payer: Medicare Other | Admitting: Family Medicine

## 2016-07-12 DIAGNOSIS — R739 Hyperglycemia, unspecified: Secondary | ICD-10-CM | POA: Diagnosis not present

## 2016-07-12 DIAGNOSIS — I701 Atherosclerosis of renal artery: Secondary | ICD-10-CM

## 2016-07-12 DIAGNOSIS — R413 Other amnesia: Secondary | ICD-10-CM

## 2016-07-12 DIAGNOSIS — M810 Age-related osteoporosis without current pathological fracture: Secondary | ICD-10-CM | POA: Diagnosis not present

## 2016-07-12 DIAGNOSIS — I10 Essential (primary) hypertension: Secondary | ICD-10-CM

## 2016-07-12 DIAGNOSIS — H8110 Benign paroxysmal vertigo, unspecified ear: Secondary | ICD-10-CM | POA: Insufficient documentation

## 2016-07-12 MED ORDER — OLMESARTAN-AMLODIPINE-HCTZ 40-10-12.5 MG PO TABS: 1.0000 | ORAL_TABLET | ORAL | 3 refills | Status: DC

## 2016-07-12 MED ORDER — ROTIGOTINE 2 MG/24HR TD PT24: MEDICATED_PATCH | TRANSDERMAL | 1 refills | Status: DC

## 2016-07-12 NOTE — Assessment & Plan Note (Signed)
S: controlled on repeat on olmesartan-amlodipine-hctz BP Readings from Last 3 Encounters:  07/12/16 132/62  05/22/16 130/60  05/10/16 134/72  A/P:Continue current medications- doing well on repeat. Home readings in 130s as well

## 2016-07-12 NOTE — Assessment & Plan Note (Signed)
S: MMSE 23/30 on 04/11/16 with reversible cause workup negative except for depression potentially with recent PHQ9's in 10-13 range primarily due tostress of dealing with son who is living with her. Has not done MRI yet though- family/patient wanted to consider through today's visit. Today patient alone nad states stress is lower and memory is much better.  A/P: Repeat MMSE at 29/30- stress is much decreased as situation with son is much better. Given improvement- will not do MRI or start aricept. Repeat 1 year.

## 2016-07-12 NOTE — Assessment & Plan Note (Signed)
S: history of GI intolerance to fosamax. Discussed osteoporosis with worst T score -3.4 in right femoral neck.  A/P: discussed option of reclast  once yearly, she declines

## 2016-07-12 NOTE — Patient Instructions (Signed)
Memory loss has improved with stress being reduced  We discussed an injection for osteoporosis which you declined  Blood pressure looks good on repeat  Refill neupro and blood pressure medicine today

## 2016-07-12 NOTE — Progress Notes (Signed)
Subjective:  Cheryl Atkinson is a 82 y.o. year old very pleasant female patient who presents for/with See problem oriented charting ROS- No chest pain or shortness of breath. No headache or blurry vision. Did have some nausea and vertigo with head movement about a week ago- resolved within a few days.see any ROS included in HPI as well.   Past Medical History-  Patient Active Problem List   Diagnosis Date Noted  . Memory loss 04/11/2016    Priority: High  . Renal artery stenosis (HCC) 09/27/2015    Priority: High  . Atherosclerotic PVD with intermittent claudication (HCC) 04/26/2015    Priority: High  . DNR (do not resuscitate) 09/29/2014    Priority: High  . Hyperlipidemia 06/30/2015    Priority: Medium  . Former smoker 09/29/2014    Priority: Medium  . Hyperglycemia 07/19/2014    Priority: Medium  . COPD (chronic obstructive pulmonary disease) (HCC) 09/20/2009    Priority: Medium  . Iron deficiency anemia 11/09/2007    Priority: Medium  . RESTLESS LEG SYNDROME 09/25/2007    Priority: Medium  . Essential hypertension 09/25/2007    Priority: Medium  . GERD 09/25/2007    Priority: Medium  . LOW BACK PAIN 09/25/2007    Priority: Medium  . Osteoporosis 09/25/2007    Priority: Medium  . Mallet toe of right foot 10/20/2015    Priority: Low  . Tinnitus 12/31/2014    Priority: Low  . Multinodular goiter 08/30/2013    Priority: Low  . Spinal stenosis of lumbar region at multiple levels 09/29/2012    Priority: Low  . HIP PAIN, BILATERAL 07/16/2010    Priority: Low  . CONSTIPATION, CHRONIC 09/20/2009    Priority: Low  . History of UTI 11/09/2007    Priority: Low  . BPPV (benign paroxysmal positional vertigo) 07/12/2016  . Claudication (HCC) 05/04/2015    Medications- reviewed and updated Current Outpatient Prescriptions  Medication Sig Dispense Refill  . aspirin EC 81 MG EC tablet Take 1 tablet (81 mg total) by mouth daily.    Marland Kitchen atorvastatin (LIPITOR) 40 MG tablet TAKE  1 TABLET DAILY AT 6 P.M. 90 tablet 2  . clopidogrel (PLAVIX) 75 MG tablet TAKE 1 TABLET DAILY WITH BREAKFAST 90 tablet 2  . Multiple Vitamins-Minerals (PRESERVISION AREDS 2 PO) Take 1 tablet by mouth 2 (two) times daily.    Marland Kitchen NEUPRO 2 MG/24HR APPLY 1 PATCH TO SKIN DAILY 90 patch 1  . Olmesartan-Amlodipine-HCTZ (TRIBENZOR) 40-10-12.5 MG TABS Take 1 tablet by mouth every morning.     . polyethylene glycol (MIRALAX / GLYCOLAX) packet Take 17 g by mouth at bedtime as needed for mild constipation. Reported on 03/14/2016    . traMADol (ULTRAM) 50 MG tablet Take 50 mg by mouth at bedtime as needed for moderate pain. Reported on 04/11/2016    . traMADol (ULTRAM-ER) 300 MG 24 hr tablet Take 300 mg by mouth daily. Reported on 04/11/2016     Objective: BP 132/62   Pulse 70   Temp 98.4 F (36.9 C) (Oral)   Wt 169 lb 12.8 oz (77 kg)   SpO2 95%   BMI 27.41 kg/m  Gen: NAD, resting comfortably CV: RRR no murmurs rubs or gallops Lungs: CTAB no crackles, wheeze, rhonchi Abdomen: soft/nontender/nondistended/normal bowel sounds. Overweight Ext: no edema Skin: warm, dry Neuro: grossly normal, moves all extremities, walks with cane  Assessment/Plan:  Memory loss S: MMSE 23/30 on 04/11/16 with reversible cause workup negative except for depression potentially with recent  PHQ9's in 10-13 range primarily due tostress of dealing with son who is living with her. Has not done MRI yet though- family/patient wanted to consider through today's visit. Today patient alone nad states stress is lower and memory is much better.  A/P: Repeat MMSE at 29/30- stress is much decreased as situation with son is much better. Given improvement- will not do MRI or start aricept. Repeat 1 year.   Osteoporosis S: history of GI intolerance to fosamax. Discussed osteoporosis with worst T score -3.4 in right femoral neck.  A/P: discussed option of reclast 5mg  once yearly, she declines  Hyperglycemia Wt Readings from Last 3  Encounters:  07/12/16 169 lb 12.8 oz (77 kg)  05/22/16 169 lb (76.7 kg)  05/10/16 168 lb (76.2 kg)  weight stable but could use mild reduction. Discussed repeating labs- prefers to do next visit.  Lab Results  Component Value Date   HGBA1C 6.4 06/30/2015     Essential hypertension S: controlled on repeat on olmesartan-amlodipine-hctz BP Readings from Last 3 Encounters:  07/12/16 132/62  05/22/16 130/60  05/10/16 134/72  A/P:Continue current medications- doing well on repeat. Home readings in 130s as well   4 months written in  Meds ordered this encounter  Medications  . rotigotine (NEUPRO) 2 MG/24HR    Sig: APPLY 1 PATCH TO SKIN DAILY    Dispense:  90 patch    Refill:  1  . Olmesartan-Amlodipine-HCTZ (TRIBENZOR) 40-10-12.5 MG TABS    Sig: Take 1 tablet by mouth every morning.    Dispense:  90 tablet    Refill:  3   The duration of face-to-face time during this visit was 25 minutes. Greater than 50% of this time was spent in counseling, explanation of diagnosis, planning of further management, and/or coordination of care.     Return precautions advised.  Tana ConchStephen Jacquiline Zurcher, MD

## 2016-07-12 NOTE — Assessment & Plan Note (Signed)
Wt Readings from Last 3 Encounters:  07/12/16 169 lb 12.8 oz (77 kg)  05/22/16 169 lb (76.7 kg)  05/10/16 168 lb (76.2 kg)  weight stable but could use mild reduction. Discussed repeating labs- prefers to do next visit.  Lab Results  Component Value Date   HGBA1C 6.4 06/30/2015

## 2016-07-12 NOTE — Progress Notes (Signed)
Pre visit review using our clinic review tool, if applicable. No additional management support is needed unless otherwise documented below in the visit note. 

## 2016-08-22 DIAGNOSIS — Z23 Encounter for immunization: Secondary | ICD-10-CM | POA: Diagnosis not present

## 2016-10-09 DIAGNOSIS — M545 Low back pain: Secondary | ICD-10-CM | POA: Diagnosis not present

## 2016-10-09 DIAGNOSIS — Z4689 Encounter for fitting and adjustment of other specified devices: Secondary | ICD-10-CM | POA: Diagnosis not present

## 2016-10-09 DIAGNOSIS — M5136 Other intervertebral disc degeneration, lumbar region: Secondary | ICD-10-CM | POA: Diagnosis not present

## 2016-10-09 DIAGNOSIS — M47817 Spondylosis without myelopathy or radiculopathy, lumbosacral region: Secondary | ICD-10-CM | POA: Diagnosis not present

## 2016-11-14 ENCOUNTER — Ambulatory Visit (INDEPENDENT_AMBULATORY_CARE_PROVIDER_SITE_OTHER): Payer: Medicare Other | Admitting: Family Medicine

## 2016-11-14 ENCOUNTER — Encounter: Payer: Self-pay | Admitting: Family Medicine

## 2016-11-14 DIAGNOSIS — I739 Peripheral vascular disease, unspecified: Secondary | ICD-10-CM

## 2016-11-14 DIAGNOSIS — I701 Atherosclerosis of renal artery: Secondary | ICD-10-CM

## 2016-11-14 DIAGNOSIS — E785 Hyperlipidemia, unspecified: Secondary | ICD-10-CM

## 2016-11-14 DIAGNOSIS — I10 Essential (primary) hypertension: Secondary | ICD-10-CM

## 2016-11-14 DIAGNOSIS — R739 Hyperglycemia, unspecified: Secondary | ICD-10-CM | POA: Diagnosis not present

## 2016-11-14 LAB — COMPREHENSIVE METABOLIC PANEL
ALT: 11 U/L (ref 0–35)
AST: 15 U/L (ref 0–37)
Albumin: 4.1 g/dL (ref 3.5–5.2)
Alkaline Phosphatase: 122 U/L — ABNORMAL HIGH (ref 39–117)
BUN: 17 mg/dL (ref 6–23)
CO2: 30 mEq/L (ref 19–32)
Calcium: 9 mg/dL (ref 8.4–10.5)
Chloride: 102 mEq/L (ref 96–112)
Creatinine, Ser: 0.69 mg/dL (ref 0.40–1.20)
GFR: 86.31 mL/min (ref >60.00)
Glucose, Bld: 89 mg/dL (ref 70–99)
Potassium: 3.7 mEq/L (ref 3.5–5.1)
Sodium: 140 mEq/L (ref 135–145)
Total Bilirubin: 0.5 mg/dL (ref 0.2–1.2)
Total Protein: 7 g/dL (ref 6.0–8.3)

## 2016-11-14 LAB — HEMOGLOBIN A1C: Hgb A1c MFr Bld: 6.2 % (ref 4.6–6.5)

## 2016-11-14 LAB — LIPID PANEL
Cholesterol: 175 mg/dL (ref 0–200)
HDL: 84.6 mg/dL (ref >39.00)
LDL Cholesterol: 76 mg/dL (ref 0–99)
NonHDL: 90.76
Total CHOL/HDL Ratio: 2
Triglycerides: 76 mg/dL (ref 0.0–149.0)
VLDL: 15.2 mg/dL (ref 0.0–40.0)

## 2016-11-14 NOTE — Assessment & Plan Note (Signed)
S: last a1c 6.4 a year ago. Weight largely stable. Eating more sweets given the season A/P: we will update a1c today. Discussed trying to be as active as possible at home- perhaps a lap around the house with each commercial break considering shorter intervals do not cause claudication

## 2016-11-14 NOTE — Assessment & Plan Note (Signed)
S: reasonably controlled on atorvastatin 40mg  with LDL 70. No myalgias and tolerates dose.  Lab Results  Component Value Date   CHOL 173 06/30/2015   HDL 79.10 06/30/2015   LDLCALC 81 06/30/2015   LDLDIRECT 70.0 03/14/2016   TRIG 63.0 06/30/2015   CHOLHDL 2 06/30/2015   A/P: continue current mediations- update lipids- consider rosuvastatin 40mg  if LDL above 70 given renal artery stenosis, potential diabetes, claudication

## 2016-11-14 NOTE — Progress Notes (Signed)
Subjective:  Cheryl Atkinson is a 82 y.o. year old very pleasant female patient who presents for/with See problem oriented charting ROS- continued claudication but stable. No chest pain. Some shortness of breath with COPD but declines inhalers. No fever or chills   Past Medical History-  Patient Active Problem List   Diagnosis Date Noted  . Memory loss 04/11/2016    Priority: High  . Renal artery stenosis (HCC) 09/27/2015    Priority: High  . Claudication (HCC) 05/04/2015    Priority: High  . Atherosclerotic PVD with intermittent claudication (HCC) 04/26/2015    Priority: High  . DNR (do not resuscitate) 09/29/2014    Priority: High  . BPPV (benign paroxysmal positional vertigo) 07/12/2016    Priority: Medium  . Hyperlipidemia 06/30/2015    Priority: Medium  . Former smoker 09/29/2014    Priority: Medium  . Hyperglycemia 07/19/2014    Priority: Medium  . COPD (chronic obstructive pulmonary disease) (HCC) 09/20/2009    Priority: Medium  . Iron deficiency anemia 11/09/2007    Priority: Medium  . RESTLESS LEG SYNDROME 09/25/2007    Priority: Medium  . Essential hypertension 09/25/2007    Priority: Medium  . GERD 09/25/2007    Priority: Medium  . LOW BACK PAIN 09/25/2007    Priority: Medium  . Osteoporosis 09/25/2007    Priority: Medium  . Mallet toe of right foot 10/20/2015    Priority: Low  . Tinnitus 12/31/2014    Priority: Low  . Multinodular goiter 08/30/2013    Priority: Low  . Spinal stenosis of lumbar region at multiple levels 09/29/2012    Priority: Low  . HIP PAIN, BILATERAL 07/16/2010    Priority: Low  . CONSTIPATION, CHRONIC 09/20/2009    Priority: Low  . History of UTI 11/09/2007    Priority: Low    Medications- reviewed and updated Current Outpatient Prescriptions  Medication Sig Dispense Refill  . aspirin EC 81 MG EC tablet Take 1 tablet (81 mg total) by mouth daily.    Marland Kitchen. atorvastatin (LIPITOR) 40 MG tablet TAKE 1 TABLET DAILY AT 6 P.M. 90 tablet  2  . clopidogrel (PLAVIX) 75 MG tablet TAKE 1 TABLET DAILY WITH BREAKFAST 90 tablet 2  . Multiple Vitamins-Minerals (PRESERVISION AREDS 2 PO) Take 1 tablet by mouth 2 (two) times daily.    . Olmesartan-Amlodipine-HCTZ (TRIBENZOR) 40-10-12.5 MG TABS Take 1 tablet by mouth every morning. 90 tablet 3  . polyethylene glycol (MIRALAX / GLYCOLAX) packet Take 17 g by mouth at bedtime as needed for mild constipation. Reported on 03/14/2016    . rotigotine (NEUPRO) 2 MG/24HR APPLY 1 PATCH TO SKIN DAILY 90 patch 1  . traMADol (ULTRAM) 50 MG tablet Take 50 mg by mouth at bedtime as needed for moderate pain. Reported on 04/11/2016    . traMADol (ULTRAM-ER) 300 MG 24 hr tablet Take 300 mg by mouth daily. Reported on 04/11/2016     No current facility-administered medications for this visit.     Objective: BP 134/70 (BP Location: Left Arm, Patient Position: Sitting, Cuff Size: Normal)   Pulse 72   Temp 98 F (36.7 C) (Oral)   Ht 5\' 6"  (1.676 m)   Wt 170 lb 12.8 oz (77.5 kg)   SpO2 95%   BMI 27.57 kg/m  Gen: NAD, resting comfortably, appears stated age CV: RRR no murmurs rubs or gallops Lungs: CTAB no crackles, wheeze, rhonchi Abdomen: overweight Skin: warm, dry, no rash Neuro: grossly normal, moves all extremities, walks with cane  Assessment/Plan:  Hyperlipidemia S: reasonably controlled on atorvastatin 40mg  with LDL 70. No myalgias and tolerates dose.  Lab Results  Component Value Date   CHOL 173 06/30/2015   HDL 79.10 06/30/2015   LDLCALC 81 06/30/2015   LDLDIRECT 70.0 03/14/2016   TRIG 63.0 06/30/2015   CHOLHDL 2 06/30/2015   A/P: continue current mediations- update lipids- consider rosuvastatin 40mg  if LDL above 70 given renal artery stenosis, potential diabetes, claudication  Renal artery stenosis (HCC) S: bp and lipids controlled. Right renal artery only- also follows with Dr. Allyson SabalBerry A/P: update bmet to trend (has done fine on the olmesartan and since stenosis is unilateral on right  have opted to continue). Hopeful no DM  Essential hypertension S: controlled on olmesartan-amlodipine-hctz BP Readings from Last 3 Encounters:  11/14/16 134/70  07/12/16 132/62  05/22/16 130/60  A/P:Continue current meds: doingwell. Would not push for lower given her age and potential orthostatic risks despite PAD history  Hyperglycemia S: last a1c 6.4 a year ago. Weight largely stable. Eating more sweets given the season A/P: we will update a1c today. Discussed trying to be as active as possible at home- perhaps a lap around the house with each commercial break considering shorter intervals do not cause claudication   Claudication (HCC) Continue BP, lipid control. Hopeful no diabetes. On plavix as well.   3-4 month  Orders Placed This Encounter  Procedures  . CBC    [place]  . Comprehensive metabolic panel    [place]    Order Specific Question:   Has the patient fasted?    Answer:   No  . Lipid panel    [place]    Order Specific Question:   Has the patient fasted?    Answer:   No  . Hemoglobin A1c    [place]   Return precautions advised.  [name], MD

## 2016-11-14 NOTE — Patient Instructions (Signed)
Labs before you go  No changes today  Hopeful diabetes has not developed    Still not smoking correct?  Do you have a computer that you use at home?

## 2016-11-14 NOTE — Progress Notes (Signed)
Pre visit review using our clinic review tool, if applicable. No additional management support is needed unless otherwise documented below in the visit note. 

## 2016-11-14 NOTE — Assessment & Plan Note (Signed)
S: bp and lipids controlled. Right renal artery only- also follows with Dr. Allyson SabalBerry A/P: update bmet to trend (has done fine on the olmesartan and since stenosis is unilateral on right have opted to continue). Hopeful no DM

## 2016-11-14 NOTE — Assessment & Plan Note (Signed)
Continue BP, lipid control. Hopeful no diabetes. On plavix as well.

## 2016-11-14 NOTE — Assessment & Plan Note (Signed)
S: controlled on olmesartan-amlodipine-hctz BP Readings from Last 3 Encounters:  11/14/16 134/70  07/12/16 132/62  05/22/16 130/60  A/P:Continue current meds: doingwell. Would not push for lower given her age and potential orthostatic risks despite PAD history

## 2016-11-15 LAB — CBC
HCT: 33.5 % — ABNORMAL LOW (ref 36.0–46.0)
Hemoglobin: 11.1 g/dL — ABNORMAL LOW (ref 12.0–15.0)
MCHC: 33.3 g/dL (ref 30.0–36.0)
MCV: 85.9 fl (ref 78.0–100.0)
Platelets: 308 10*3/uL (ref 150.0–400.0)
RBC: 3.89 Mil/uL (ref 3.87–5.11)
RDW: 13.8 % (ref 11.5–15.5)
WBC: 7.4 10*3/uL (ref 4.0–10.5)

## 2016-12-09 ENCOUNTER — Ambulatory Visit (INDEPENDENT_AMBULATORY_CARE_PROVIDER_SITE_OTHER): Payer: Medicare Other | Admitting: Ophthalmology

## 2016-12-11 ENCOUNTER — Ambulatory Visit (INDEPENDENT_AMBULATORY_CARE_PROVIDER_SITE_OTHER): Payer: Medicare Other | Admitting: Ophthalmology

## 2016-12-11 DIAGNOSIS — H353132 Nonexudative age-related macular degeneration, bilateral, intermediate dry stage: Secondary | ICD-10-CM | POA: Diagnosis not present

## 2016-12-11 DIAGNOSIS — H43813 Vitreous degeneration, bilateral: Secondary | ICD-10-CM

## 2016-12-11 DIAGNOSIS — I10 Essential (primary) hypertension: Secondary | ICD-10-CM

## 2016-12-11 DIAGNOSIS — H35033 Hypertensive retinopathy, bilateral: Secondary | ICD-10-CM

## 2016-12-11 DIAGNOSIS — H43823 Vitreomacular adhesion, bilateral: Secondary | ICD-10-CM

## 2016-12-17 ENCOUNTER — Other Ambulatory Visit: Payer: Self-pay | Admitting: Cardiovascular Disease

## 2016-12-17 DIAGNOSIS — I739 Peripheral vascular disease, unspecified: Secondary | ICD-10-CM

## 2016-12-21 ENCOUNTER — Inpatient Hospital Stay (HOSPITAL_COMMUNITY): Payer: Medicare Other

## 2016-12-21 ENCOUNTER — Inpatient Hospital Stay (HOSPITAL_COMMUNITY)
Admission: EM | Admit: 2016-12-21 | Discharge: 2016-12-24 | DRG: 580 | Disposition: A | Discharge status: Home / Self Care | Payer: Medicare Other | Attending: Internal Medicine | Admitting: Internal Medicine

## 2016-12-21 ENCOUNTER — Encounter (HOSPITAL_COMMUNITY): Payer: Self-pay

## 2016-12-21 DIAGNOSIS — J438 Other emphysema: Secondary | ICD-10-CM | POA: Diagnosis not present

## 2016-12-21 DIAGNOSIS — M545 Low back pain: Secondary | ICD-10-CM | POA: Diagnosis present

## 2016-12-21 DIAGNOSIS — E890 Postprocedural hypothyroidism: Secondary | ICD-10-CM | POA: Diagnosis present

## 2016-12-21 DIAGNOSIS — Z9842 Cataract extraction status, left eye: Secondary | ICD-10-CM | POA: Diagnosis not present

## 2016-12-21 DIAGNOSIS — S61451D Open bite of right hand, subsequent encounter: Secondary | ICD-10-CM | POA: Diagnosis not present

## 2016-12-21 DIAGNOSIS — Z87891 Personal history of nicotine dependence: Secondary | ICD-10-CM | POA: Diagnosis not present

## 2016-12-21 DIAGNOSIS — L02511 Cutaneous abscess of right hand: Principal | ICD-10-CM | POA: Diagnosis present

## 2016-12-21 DIAGNOSIS — R740 Nonspecific elevation of levels of transaminase and lactic acid dehydrogenase [LDH]: Secondary | ICD-10-CM | POA: Diagnosis present

## 2016-12-21 DIAGNOSIS — L03113 Cellulitis of right upper limb: Secondary | ICD-10-CM | POA: Diagnosis present

## 2016-12-21 DIAGNOSIS — I70219 Atherosclerosis of native arteries of extremities with intermittent claudication, unspecified extremity: Secondary | ICD-10-CM | POA: Diagnosis not present

## 2016-12-21 DIAGNOSIS — Z9841 Cataract extraction status, right eye: Secondary | ICD-10-CM | POA: Diagnosis not present

## 2016-12-21 DIAGNOSIS — Z90710 Acquired absence of both cervix and uterus: Secondary | ICD-10-CM

## 2016-12-21 DIAGNOSIS — Z66 Do not resuscitate: Secondary | ICD-10-CM | POA: Diagnosis present

## 2016-12-21 DIAGNOSIS — S61451A Open bite of right hand, initial encounter: Secondary | ICD-10-CM | POA: Diagnosis present

## 2016-12-21 DIAGNOSIS — G2581 Restless legs syndrome: Secondary | ICD-10-CM | POA: Diagnosis present

## 2016-12-21 DIAGNOSIS — Y92019 Unspecified place in single-family (private) house as the place of occurrence of the external cause: Secondary | ICD-10-CM

## 2016-12-21 DIAGNOSIS — R739 Hyperglycemia, unspecified: Secondary | ICD-10-CM | POA: Diagnosis not present

## 2016-12-21 DIAGNOSIS — E876 Hypokalemia: Secondary | ICD-10-CM | POA: Diagnosis not present

## 2016-12-21 DIAGNOSIS — I701 Atherosclerosis of renal artery: Secondary | ICD-10-CM | POA: Diagnosis not present

## 2016-12-21 DIAGNOSIS — G8929 Other chronic pain: Secondary | ICD-10-CM | POA: Diagnosis present

## 2016-12-21 DIAGNOSIS — Z961 Presence of intraocular lens: Secondary | ICD-10-CM | POA: Diagnosis present

## 2016-12-21 DIAGNOSIS — E8881 Metabolic syndrome: Secondary | ICD-10-CM | POA: Diagnosis present

## 2016-12-21 DIAGNOSIS — D509 Iron deficiency anemia, unspecified: Secondary | ICD-10-CM | POA: Diagnosis present

## 2016-12-21 DIAGNOSIS — Z8249 Family history of ischemic heart disease and other diseases of the circulatory system: Secondary | ICD-10-CM

## 2016-12-21 DIAGNOSIS — K219 Gastro-esophageal reflux disease without esophagitis: Secondary | ICD-10-CM | POA: Diagnosis not present

## 2016-12-21 DIAGNOSIS — W5501XA Bitten by cat, initial encounter: Secondary | ICD-10-CM | POA: Diagnosis not present

## 2016-12-21 DIAGNOSIS — I10 Essential (primary) hypertension: Secondary | ICD-10-CM | POA: Diagnosis present

## 2016-12-21 DIAGNOSIS — R7989 Other specified abnormal findings of blood chemistry: Secondary | ICD-10-CM | POA: Diagnosis present

## 2016-12-21 DIAGNOSIS — E785 Hyperlipidemia, unspecified: Secondary | ICD-10-CM | POA: Diagnosis present

## 2016-12-21 DIAGNOSIS — R413 Other amnesia: Secondary | ICD-10-CM | POA: Diagnosis present

## 2016-12-21 DIAGNOSIS — J449 Chronic obstructive pulmonary disease, unspecified: Secondary | ICD-10-CM | POA: Diagnosis present

## 2016-12-21 DIAGNOSIS — S51851A Open bite of right forearm, initial encounter: Secondary | ICD-10-CM | POA: Diagnosis not present

## 2016-12-21 LAB — CBC WITH DIFFERENTIAL/PLATELET
Basophils Absolute: 0 10*3/uL (ref 0.0–0.1)
Basophils Relative: 0 %
Eosinophils Absolute: 0 10*3/uL (ref 0.0–0.7)
Eosinophils Relative: 0 %
HCT: 35.7 % — ABNORMAL LOW (ref 36.0–46.0)
Hemoglobin: 11.4 g/dL — ABNORMAL LOW (ref 12.0–15.0)
Lymphocytes Relative: 16 %
Lymphs Abs: 1.7 10*3/uL (ref 0.7–4.0)
MCH: 28.4 pg (ref 26.0–34.0)
MCHC: 31.9 g/dL (ref 30.0–36.0)
MCV: 89 fL (ref 78.0–100.0)
Monocytes Absolute: 0.7 10*3/uL (ref 0.1–1.0)
Monocytes Relative: 6 %
Neutro Abs: 8.8 10*3/uL — ABNORMAL HIGH (ref 1.7–7.7)
Neutrophils Relative %: 78 %
Platelets: 264 10*3/uL (ref 150–400)
RBC: 4.01 MIL/uL (ref 3.87–5.11)
RDW: 13.8 % (ref 11.5–15.5)
WBC: 11.2 10*3/uL — ABNORMAL HIGH (ref 4.0–10.5)

## 2016-12-21 LAB — COMPREHENSIVE METABOLIC PANEL
ALT: 12 U/L — ABNORMAL LOW (ref 14–54)
AST: 24 U/L (ref 15–41)
Albumin: 3.5 g/dL (ref 3.5–5.0)
Alkaline Phosphatase: 100 U/L (ref 38–126)
Anion gap: 12 (ref 5–15)
BUN: 15 mg/dL (ref 6–20)
CO2: 23 mmol/L (ref 22–32)
Calcium: 8.9 mg/dL (ref 8.9–10.3)
Chloride: 101 mmol/L (ref 101–111)
Creatinine, Ser: 0.93 mg/dL (ref 0.44–1.00)
GFR calc Af Amer: >60 mL/min (ref >60)
GFR calc non Af Amer: 55 mL/min — ABNORMAL LOW (ref >60)
Glucose, Bld: 231 mg/dL — ABNORMAL HIGH (ref 65–99)
Potassium: 3 mmol/L — ABNORMAL LOW (ref 3.5–5.1)
Sodium: 136 mmol/L (ref 135–145)
Total Bilirubin: 0.9 mg/dL (ref 0.3–1.2)
Total Protein: 7.7 g/dL (ref 6.5–8.1)

## 2016-12-21 LAB — I-STAT CG4 LACTIC ACID, ED
Lactic Acid, Venous: 4.34 mmol/L (ref 0.5–1.9)
Lactic Acid, Venous: 1.2 mmol/L (ref 0.5–1.9)

## 2016-12-21 LAB — SEDIMENTATION RATE: Sed Rate: 59 mm/hr — ABNORMAL HIGH (ref 0–22)

## 2016-12-21 LAB — PROCALCITONIN: Procalcitonin: <0.1 ng/mL

## 2016-12-21 LAB — MAGNESIUM: Magnesium: 2.3 mg/dL (ref 1.7–2.4)

## 2016-12-21 LAB — LACTIC ACID, PLASMA: Lactic Acid, Venous: 1.1 mmol/L (ref 0.5–1.9)

## 2016-12-21 LAB — CK: Total CK: 54 U/L (ref 38–234)

## 2016-12-21 MED ORDER — SODIUM CHLORIDE 0.9 % IV SOLN
3.0000 g | Freq: Four times a day (QID) | INTRAVENOUS | Status: DC
  Administered 2016-12-21 – 2016-12-24 (×10): 3 g via INTRAVENOUS
  Filled 2016-12-21 (×12): qty 3

## 2016-12-21 MED ORDER — ONDANSETRON HCL 4 MG/2ML IJ SOLN: 4.0000 mg | Freq: Four times a day (QID) | INTRAMUSCULAR | Status: DC | PRN

## 2016-12-21 MED ORDER — ACETAMINOPHEN 650 MG RE SUPP: 650.0000 mg | Freq: Four times a day (QID) | RECTAL | Status: DC | PRN

## 2016-12-21 MED ORDER — OXYCODONE HCL 5 MG PO TABS
5.0000 mg | ORAL_TABLET | ORAL | Status: DC | PRN
  Administered 2016-12-22: 5 mg via ORAL
  Filled 2016-12-21: qty 1
  Filled 2016-12-21: qty 2

## 2016-12-21 MED ORDER — POTASSIUM CHLORIDE IN NACL 20-0.9 MEQ/L-% IV SOLN
INTRAVENOUS | Status: DC
  Administered 2016-12-21 – 2016-12-22 (×2): via INTRAVENOUS
  Filled 2016-12-21 (×4): qty 1000

## 2016-12-21 MED ORDER — ACETAMINOPHEN 325 MG PO TABS
650.0000 mg | ORAL_TABLET | Freq: Four times a day (QID) | ORAL | Status: DC | PRN
  Administered 2016-12-22 – 2016-12-23 (×3): 650 mg via ORAL
  Filled 2016-12-21 (×3): qty 2

## 2016-12-21 MED ORDER — ROTIGOTINE 2 MG/24HR TD PT24
1.0000 | MEDICATED_PATCH | Freq: Every day | TRANSDERMAL | Status: DC
  Filled 2016-12-21 (×3): qty 1

## 2016-12-21 MED ORDER — SODIUM CHLORIDE 0.9 % IV SOLN
3.0000 g | Freq: Once | INTRAVENOUS | Status: AC
  Administered 2016-12-21: 3 g via INTRAVENOUS
  Filled 2016-12-21: qty 3

## 2016-12-21 MED ORDER — ASPIRIN EC 81 MG PO TBEC
81.0000 mg | DELAYED_RELEASE_TABLET | Freq: Every day | ORAL | Status: DC
  Administered 2016-12-21 – 2016-12-24 (×3): 81 mg via ORAL
  Filled 2016-12-21 (×3): qty 1

## 2016-12-21 MED ORDER — CLOPIDOGREL BISULFATE 75 MG PO TABS
75.0000 mg | ORAL_TABLET | Freq: Every day | ORAL | Status: DC
  Administered 2016-12-21 – 2016-12-24 (×3): 75 mg via ORAL
  Filled 2016-12-21 (×3): qty 1

## 2016-12-21 MED ORDER — SODIUM CHLORIDE 0.9 % IV SOLN
INTRAVENOUS | Status: DC
  Administered 2016-12-21: 12:00:00 via INTRAVENOUS

## 2016-12-21 MED ORDER — POTASSIUM CHLORIDE 20 MEQ PO PACK
40.0000 meq | PACK | Freq: Once | ORAL | Status: AC
  Administered 2016-12-21: 40 meq via ORAL
  Filled 2016-12-21: qty 2

## 2016-12-21 MED ORDER — ATORVASTATIN CALCIUM 40 MG PO TABS
40.0000 mg | ORAL_TABLET | Freq: Every day | ORAL | Status: DC
  Administered 2016-12-22 – 2016-12-23 (×2): 40 mg via ORAL
  Filled 2016-12-21 (×2): qty 1

## 2016-12-21 MED ORDER — KETOROLAC TROMETHAMINE 15 MG/ML IJ SOLN
15.0000 mg | Freq: Once | INTRAMUSCULAR | Status: AC
  Administered 2016-12-21: 15 mg via INTRAVENOUS
  Filled 2016-12-21: qty 1

## 2016-12-21 MED ORDER — GADOBENATE DIMEGLUMINE 529 MG/ML IV SOLN
15.0000 mL | Freq: Once | INTRAVENOUS | Status: AC | PRN
  Administered 2016-12-21: 15 mL via INTRAVENOUS

## 2016-12-21 MED ORDER — MORPHINE SULFATE (PF) 4 MG/ML IV SOLN
1.0000 mg | INTRAVENOUS | Status: DC | PRN
  Administered 2016-12-22: 4 mg via INTRAVENOUS
  Filled 2016-12-21: qty 1

## 2016-12-21 MED ORDER — ONDANSETRON HCL 4 MG PO TABS: 4.0000 mg | ORAL_TABLET | Freq: Four times a day (QID) | ORAL | Status: DC | PRN

## 2016-12-21 MED ORDER — AMLODIPINE BESYLATE 10 MG PO TABS
10.0000 mg | ORAL_TABLET | Freq: Every day | ORAL | Status: DC
  Administered 2016-12-21 – 2016-12-24 (×3): 10 mg via ORAL
  Filled 2016-12-21: qty 2
  Filled 2016-12-21 (×2): qty 1

## 2016-12-21 MED ORDER — SODIUM CHLORIDE 0.9% FLUSH
3.0000 mL | Freq: Two times a day (BID) | INTRAVENOUS | Status: DC
  Administered 2016-12-21 – 2016-12-23 (×4): 3 mL via INTRAVENOUS

## 2016-12-21 MED ORDER — ENOXAPARIN SODIUM 40 MG/0.4ML ~~LOC~~ SOLN: 40.0000 mg | SUBCUTANEOUS | Status: DC

## 2016-12-21 NOTE — Progress Notes (Signed)
Paged on call Robb MatarOrtiz, D at this time regarding potassium level of 3.0 taken earlier today. Requesting supplement replacement

## 2016-12-21 NOTE — H&P (Signed)
History and Physical    Cheryl Atkinson:[id number] DOB: Feb 19, 1931 DOA: 12/21/2016   PCP: Garret Reddish, MD   Patient coming from/Resides with: Private residence  Admission status: Inpatient/floor-medically necessary to stay a minimum 2 midnights to rule out impending and/or unexpected changes in physiologic status that may differ from initial evaluation performed in the ER and/or at time of admission. Patient presents with cellulitic changes of the right hand and forearm after experiencing a cat bite 48 hours previous. She has significant swelling in the proximal joints of the right hand as well as significant pain. She will require IV antibiotics, combination of oral and IV pain medications and imaging of the hand and forearm. Pending results of imaging she may require surgical intervention.  Chief Complaint: Cat bite right hand  HPI: Cheryl Atkinson is a 83 y.o. female with medical history significant for hypertension, COPD, peripheral vascular disease with lower extremity claudication, iron deficiency anemia, memory loss, renal artery stenosis and dyslipidemia. Patient reports being bitten by household pet/2 days ago. Since that time she's experienced progressive redness and swelling with development of significant pain. Because of the pain she's been unable to sleep. She's not had any fevers or chills. She has had nausea. Last tetanus shot within the past 10 years after a dog bite.  ED Course:  Vital Signs: BP 116/89   Pulse 66   Temp 98.1 F (36.7 C) (Oral)   Resp 16   SpO2 98%  Lab data: Sodium 136, potassium 3.0, chloride 101, CO2 23, glucose 231, BUN 15, creatinine 0.93, LFTs normal, initial lactic acid 4.34 with repeat 1.20, white count 11,200 with neutrophils 78% absolute neutrophils 8.8%, hemoglobin 1.4, platelets 264,000; blood cultures obtained in the ER Medications and treatments: Unasyn 3 g IV 1  Review of Systems:  In addition to the HPI above,  No Fever-chills,  myalgias or other constitutional symptoms No Headache, changes with Vision or hearing, new weakness, tingling, numbness in any extremity, dizziness, dysarthria or word finding difficulty, gait disturbance or imbalance, tremors or seizure activity No problems swallowing food or Liquids, indigestion/reflux, choking or coughing while eating, abdominal pain with or after eating No Chest pain, Cough or Shortness of Breath, palpitations, orthopnea or DOE No Abdominal pain, emesis, melena,hematochezia, dark tarry stools, constipation No dysuria, malodorous urine, hematuria or flank pain No new skin rashes, lesions, masses or bruises-puncture wound to dorsum of right hand 2 days ago after cat bite No recent unintentional weight gain or loss No polyuria, polydypsia or polyphagia   Past Medical History:  Diagnosis Date  . Anemia   . Arthritis    "shoulders" (05/04/2015)  . Cellulitis of right lower extremity 11/27/2013  . Chronic lower back pain   . Constipation   . GERD (gastroesophageal reflux disease)   . History of hiatal hernia   . Hypertension   . Osteoporosis   . Peripheral arterial disease ([place])   . Restless leg syndrome   . Thyroid disease     Past Surgical History:  Procedure Laterality Date  . ABDOMINAL AORTAGRAM  05/04/2015   Procedure: Abdominal Aortagram;  Surgeon: Lorretta Harp, MD;  Location: [place] [facility];  Service: Cardiovascular;;  . APPENDECTOMY    . CATARACT EXTRACTION W/ INTRAOCULAR LENS  IMPLANT, BILATERAL Bilateral   . PERIPHERAL VASCULAR CATHETERIZATION N/A 05/04/2015   Procedure: Lower Extremity Angiography;  Surgeon: Lorretta Harp, MD;  Location: [place] [facility];  Service: Cardiovascular;  Laterality: N/A;  . PERIPHERAL VASCULAR CATHETERIZATION  05/04/2015   Procedure: Peripheral Vascular Intervention;  Surgeon: Lorretta Harp, MD;  Location: [place] [facility];  Service: Cardiovascular;;  RCIA - 7x22 ICAST  . PERIPHERAL VASCULAR CATHETERIZATION Right  09/04/2015   Procedure: Peripheral Vascular Atherectomy;  Surgeon: Lorretta Harp, MD;  Location: [place] [facility];  Service: Cardiovascular;  Laterality: Right;  SFA  . sfa Right 09/04/2015   de balloon  . THYROID SURGERY Right ?2013   "had goiter taken off my neck"  . TONSILLECTOMY    . VAGINAL HYSTERECTOMY      Social History   Social History  . Marital status: Widowed    Spouse name: N/A  . Number of children: N/A  . Years of education: N/A   Occupational History  . Not on file.   Social History Main Topics  . Smoking status: Former Smoker    Packs/day: 0.50    Years: 60.00    Types: Cigarettes    Quit date: 04/02/2015  . Smokeless tobacco: Never Used  . Alcohol use No  . Drug use: No  . Sexual activity: No   Other Topics Concern  . Not on file   Social History Narrative   Widowed 2013. 2 sons. 1 grandchild.       Retired from [employer].       Hobbies: time at home and with family      HCPOA: sister-in-law and brother. Margarite Gouge.       DNR/DNI      Regular exercise: none   Caffeine use: cup of coffee     Mobility: Cane and walker Work history: Not obtained   Allergies  Allergen Reactions  . Codeine Other (See Comments)    REACTION: Syncope     Family History  Problem Relation Age of Onset  . Heart disease Father   . Coronary artery disease       Prior to Admission medications   Medication Sig Start Date End Date Taking? Authorizing Provider  aspirin EC 81 MG EC tablet Take 1 tablet (81 mg total) by mouth daily. 05/05/15  Yes Luke K Kilroy, PA-C  atorvastatin (LIPITOR) 40 MG tablet TAKE 1 TABLET DAILY AT 6 P.M. 07/09/16  Yes Lorretta Harp, MD  clopidogrel (PLAVIX) 75 MG tablet TAKE 1 TABLET DAILY WITH BREAKFAST 07/09/16  Yes Lorretta Harp, MD  CONZIP 300 MG CP24 Take 300 mg by mouth every morning. 12/13/16  Yes Historical Provider, MD  Multiple Vitamins-Minerals (PRESERVISION AREDS 2 PO) Take 1 tablet by mouth 2 (two) times daily.    Yes Historical Provider, MD  Olmesartan-Amlodipine-HCTZ (TRIBENZOR) 40-10-12.5 MG TABS Take 1 tablet by mouth every morning. 07/12/16  Yes Marin Olp, MD  polyethylene glycol [facility] / GLYCOLAX) packet Take 17 g by mouth at bedtime as needed for mild constipation. Reported on 03/14/2016   Yes Historical Provider, MD  rotigotine (NEUPRO) 2 MG/24HR APPLY 1 PATCH TO SKIN DAILY 07/12/16  Yes Marin Olp, MD  traMADol (ULTRAM) 50 MG tablet Take 50 mg by mouth at bedtime as needed for moderate pain.   Yes Historical Provider, MD    Physical Exam: Vitals:   12/21/16 0949 12/21/16 1104 12/21/16 1105  BP: 132/63 116/89   Pulse: 86  66  Resp: 16    Temp: 98.1 F (36.7 C)    TempSrc: Oral    SpO2: 100%  98%      Constitutional: NAD, calm, uncomfortable [place] ongoing right hand pain Eyes: PERRL, lids and  conjunctivae normal ENMT: Mucous membranes are dry. Posterior pharynx clear of any exudate or lesions.Normal dentition.  Neck: normal, supple, no masses, no thyromegaly Respiratory: clear to auscultation bilaterally, no wheezing, no crackles. Normal respiratory effort. No accessory muscle use.  Cardiovascular: Regular rate and rhythm, no murmurs / rubs / gallops. No extremity edema. 2+ pedal pulses. No carotid bruits.  Abdomen: no tenderness, no masses palpated. No hepatosplenomegaly. Bowel sounds positive.  Musculoskeletal: no clubbing / cyanosis. No joint deformity upper and lower extremities. Good ROM, no contractures. Normal muscle tone. Swelling proximal joints right hand-unable to completely flex fingers closed on right hand secondary to pain and swelling. Skin: no rashes, lesions, ulcers. Edema and erythema/cellulitic skin changes involving right hand extending up the forearm but does not extend to elbow. Area marked with pen. Please see pictures above in attestation Neurologic: CN 2-12 grossly intact. Sensation intact, DTR normal. Strength 5/5 x all 4 extremities.  Psychiatric:  Normal judgment and insight. Alert and oriented x 3. Normal mood.    Labs on Admission: I have personally reviewed following labs and imaging studies  CBC:  Recent Labs Lab 12/21/16 0954  WBC 11.2*  NEUTROABS 8.8*  HGB 11.4*  HCT 35.7*  MCV 89.0  PLT 295   Basic Metabolic Panel:  Recent Labs Lab 12/21/16 0954  NA 136  K 3.0*  CL 101  CO2 23  GLUCOSE 231*  BUN 15  CREATININE 0.93  CALCIUM 8.9   GFR: CrCl cannot be calculated (Unknown ideal weight.). Liver Function Tests:  Recent Labs Lab 12/21/16 0954  AST 24  ALT 12*  ALKPHOS 100  BILITOT 0.9  PROT 7.7  ALBUMIN 3.5   No results for input(s): LIPASE, AMYLASE in the last 168 hours. No results for input(s): AMMONIA in the last 168 hours. Coagulation Profile: No results for input(s): INR, PROTIME in the last 168 hours. Cardiac Enzymes: No results for input(s): CKTOTAL, CKMB, CKMBINDEX, TROPONINI in the last 168 hours. BNP (last 3 results) No results for input(s): PROBNP in the last 8760 hours. HbA1C: No results for input(s): HGBA1C in the last 72 hours. CBG: No results for input(s): GLUCAP in the last 168 hours. Lipid Profile: No results for input(s): CHOL, HDL, LDLCALC, TRIG, CHOLHDL, LDLDIRECT in the last 72 hours. Thyroid Function Tests: No results for input(s): TSH, T4TOTAL, FREET4, T3FREE, THYROIDAB in the last 72 hours. Anemia Panel: No results for input(s): VITAMINB12, FOLATE, FERRITIN, TIBC, IRON, RETICCTPCT in the last 72 hours. Urine analysis:    Component Value Date/Time   COLORURINE AMBER (A) 03/08/2015 0926   APPEARANCEUR CLEAR 03/08/2015 0926   LABSPEC 1.015 03/06/2016 1338   PHURINE 6.0 03/06/2016 1338   GLUCOSEU NEGATIVE 03/06/2016 1338   HGBUR TRACE (A) 03/06/2016 1338   BILIRUBINUR NEGATIVE 03/06/2016 1338   BILIRUBINUR + 09/24/2011 1334   KETONESUR NEGATIVE 03/06/2016 1338   PROTEINUR NEGATIVE 03/06/2016 1338   UROBILINOGEN 0.2 03/06/2016 1338   NITRITE NEGATIVE 03/06/2016  1338   LEUKOCYTESUR SMALL (A) 03/06/2016 1338   Sepsis Labs: '@LABRCNTIP' (procalcitonin:4,lacticidven:4) )No results found for this or any previous visit (from the past 240 hour(s)).   Radiological Exams on Admission: No results found.    Assessment/Plan Principal Problem:   Cellulitis of right hand -Patient presents with swelling of joints of right hand and associated erythematous/cellulitic skin changes involving the entire hand and most of the forearm after experiencing a cat bite 2 days prior -Initial lactic acid greater than 4 but repeat down to 1.2-currently no signs of sepsis  otherwise -Continue Unasyn -Follow up on blood cultures -Trend ESR and Procalcitonin -MRI right hand to rule out destructive soft tissue process/abscess   Active Problems:   Hyperglycemia -Recurrent issue as documented in PCP notes -HgbA1c 6.2 in December -Current CBG greater than 270 repeat A1c    Elevated lactic acid level -Initial greater than 4 but repeat 1.2 -Continue IV fluids at 100 mL per hour for now    Acute hypokalemia -Oral replacement and follow labs    Iron deficiency anemia -Hemoglobin stable and at baseline 11.4    Essential hypertension -Recommendation from PCP was to keep blood pressure readings in the 130/60-70 range secondary to advanced age and potential for orthostasis -Current blood pressure controlled -Continue Norvasc -Hold ARB and thiazide diuretic acutely especially with need for IV contrast    COPD (chronic obstructive pulmonary disease)  -Currently asymptomatic    Atherosclerotic PVD with intermittent claudication  -Patient reports recurrent issues with claudication -Has routine follow-up scheduled with Dr. Gwenlyn Found -Continue low-dose aspirin and Plavix, statin    Hyperlipidemia -Continue statin    Renal artery stenosis  -Involves right renal artery and is also followed by Dr. Gwenlyn Found -Renal function has remained stable on ARB according to PCP notes -Current  renal function 0.93 with baseline 0.69 to 0.81    Memory loss/RLS -Continue Neupro -Not on dementia medications      DVT prophylaxis: Lovenox Code Status: DO NOT RESUSCITATE  Family Communication: Son at bedside Disposition Plan: Anticipate discharge back to preadmission home environment when medically stable Consults called: None    ELLIS,ALLISON L. ANP-BC [facility] Pager [phone]   If 7PM-7AM, please contact night-coverage [email or web address] Password [email or web address]  12/21/2016, 12:21 PM

## 2016-12-21 NOTE — ED Provider Notes (Addendum)
[facility]-EMERGENCY DEPT Provider Note   CSN: [id number] Arrival date & time: 12/21/16  0945     History   Chief Complaint Chief Complaint  Patient presents with  . Animal Bite    HPI Cheryl Atkinson is a 83 y.o. female.  HPI Patient presents to the emergency department 2 days after a cat bite to the dorsum of her right hand.  She presents with extending erythema up to her proximal right forearm.  She reports warmth and redness of this area with pain.  She denies drainage or pus from the bite wound on the dorsum of the right hand.  She has normal flexion and extension in all digits of the right hand.  Full range of motion of the right wrist.  No documented fevers at home.  No chills.  Pain is mild to moderate in severity.   Past Medical History:  Diagnosis Date  . Anemia   . Arthritis    "shoulders" (05/04/2015)  . Cellulitis of right lower extremity 11/27/2013  . Chronic lower back pain   . Constipation   . GERD (gastroesophageal reflux disease)   . History of hiatal hernia   . Hypertension   . Osteoporosis   . Peripheral arterial disease ([facility])   . Restless leg syndrome   . Thyroid disease     Patient Active Problem List   Diagnosis Date Noted  . BPPV (benign paroxysmal positional vertigo) 07/12/2016  . Memory loss 04/11/2016  . Mallet toe of right foot 10/20/2015  . Renal artery stenosis (HCC) 09/27/2015  . Hyperlipidemia 06/30/2015  . Claudication ([facility]) 05/04/2015  . Atherosclerotic PVD with intermittent claudication ([facility]) 04/26/2015  . Tinnitus 12/31/2014  . Former smoker 09/29/2014  . DNR (do not resuscitate) 09/29/2014  . Hyperglycemia 07/19/2014  . Multinodular goiter 08/30/2013  . Spinal stenosis of lumbar region at multiple levels 09/29/2012  . HIP PAIN, BILATERAL 07/16/2010  . COPD (chronic obstructive pulmonary disease) ([facility]) 09/20/2009  . CONSTIPATION, CHRONIC 09/20/2009  . Iron deficiency anemia 11/09/2007  . History of UTI 11/09/2007  . RESTLESS LEG  SYNDROME 09/25/2007  . Essential hypertension 09/25/2007  . GERD 09/25/2007  . LOW BACK PAIN 09/25/2007  . Osteoporosis 09/25/2007    Past Surgical History:  Procedure Laterality Date  . ABDOMINAL AORTAGRAM  05/04/2015   Procedure: Abdominal Aortagram;  Surgeon: Runell GessJonathan J Berry, MD;  [facility];  Service: Cardiovascular;;  . APPENDECTOMY    . CATARACT EXTRACTION W/ INTRAOCULAR LENS  IMPLANT, BILATERAL Bilateral   . PERIPHERAL VASCULAR CATHETERIZATION N/A 05/04/2015   Procedure: Lower Extremity Angiography;  Surgeon: Runell GessJonathan J Berry, MD;  [facility];  Service: Cardiovascular;  Laterality: N/A;  . PERIPHERAL VASCULAR CATHETERIZATION  05/04/2015   Procedure: Peripheral Vascular Intervention;  Surgeon: Runell GessJonathan J Berry, MD;  [facility];  Service: Cardiovascular;;  RCIA - 7x22 ICAST  . PERIPHERAL VASCULAR CATHETERIZATION Right 09/04/2015   Procedure: Peripheral Vascular Atherectomy;  Surgeon: Runell GessJonathan J Berry, MD;  [facility];  Service: Cardiovascular;  Laterality: Right;  SFA  . sfa Right 09/04/2015   de balloon  . THYROID SURGERY Right ?2013   "had goiter taken off my neck"  . TONSILLECTOMY    . VAGINAL HYSTERECTOMY      OB History    No data available       Home Medications    Prior to Admission medications   Medication Sig Start Date End Date Taking? Authorizing Provider  aspirin EC 81 MG EC tablet Take 1 tablet (81 mg total) by mouth daily. 05/05/15   Abelino Derrick, PA-C  atorvastatin (LIPITOR) 40 MG tablet TAKE 1 TABLET DAILY AT 6 P.M. 07/09/16   Runell Gess, MD  clopidogrel (PLAVIX) 75 MG tablet TAKE 1 TABLET DAILY WITH BREAKFAST 07/09/16   Runell Gess, MD  Multiple Vitamins-Minerals (PRESERVISION AREDS 2 PO) Take 1 tablet by mouth 2 (two) times daily.    Historical Provider, MD  Olmesartan-Amlodipine-HCTZ (TRIBENZOR) 40-10-12.5 MG TABS Take 1 tablet by mouth every morning. 07/12/16   Shelva Majestic, MD   polyethylene glycol [facility] / Ethelene Hal) packet Take 17 g by mouth at bedtime as needed for mild constipation. Reported on 03/14/2016    Historical Provider, MD  rotigotine (NEUPRO) 2 MG/24HR APPLY 1 PATCH TO SKIN DAILY 07/12/16   Shelva Majestic, MD  traMADol (ULTRAM) 50 MG tablet Take 50 mg by mouth at bedtime as needed for moderate pain. Reported on 04/11/2016    Historical Provider, MD  traMADol (ULTRAM-ER) 300 MG 24 hr tablet Take 300 mg by mouth daily. Reported on 04/11/2016    Historical Provider, MD    Family History Family History  Problem Relation Age of Onset  . Heart disease Father   . Coronary artery disease      Social History Social History  Substance Use Topics  . Smoking status: Former Smoker    Packs/day: 0.50    Years: 60.00    Types: Cigarettes    Quit date: 04/02/2015  . Smokeless tobacco: Never Used  . Alcohol use No     Allergies   Codeine   Review of Systems Review of Systems  All other systems reviewed and are negative.    Physical Exam Updated Vital Signs BP 116/89   Pulse 66   Temp 98.1 F (36.7 C) (Oral)   Resp 16   SpO2 98%   Physical Exam  Constitutional: She is oriented to person, place, and time. She appears well-developed and well-nourished.  HENT:  Head: Normocephalic.  Eyes: EOM are normal.  Neck: Normal range of motion.  Pulmonary/Chest: Effort normal.  Abdominal: She exhibits no distension.  Musculoskeletal:  Erythema and warmth of the dorsum of the right hand with normal range of motion at the right wrist and an all fingers of the right hand.  Erythema and warmth extending to the posterior proximal forearm.  Normal range of motion of the right elbow.  Normal right radial pulse.  Small penetrating wound noted to the dorsum of the right hand without surrounding fluctuance or drainage.  No obvious tissue breakdown in this area  Neurological: She is alert and oriented to person, place, and time.  Psychiatric: She has a normal mood  and affect.  Nursing note and vitals reviewed.    ED Treatments / Results  Labs (all labs ordered are listed, but only abnormal results are displayed) Labs Reviewed  COMPREHENSIVE METABOLIC PANEL - Abnormal; Notable for the following:       Result Value   Potassium 3.0 (*)    Glucose, Bld 231 (*)    ALT 12 (*)    GFR calc non Af Amer 55 (*)    All other components within normal limits  CBC WITH DIFFERENTIAL/PLATELET - Abnormal; Notable for the following:    WBC 11.2 (*)    Hemoglobin 11.4 (*)    HCT 35.7 (*)    Neutro Abs 8.8 (*)    All other components within  normal limits  I-STAT CG4 LACTIC ACID, ED - Abnormal; Notable for the following:    Lactic Acid, Venous 4.34 (*)    All other components within normal limits  CULTURE, BLOOD (ROUTINE X 2)  CULTURE, BLOOD (ROUTINE X 2)    EKG  EKG Interpretation None       Radiology No results found.  Procedures Procedures (including critical care time)  Medications Ordered in ED Medications  Ampicillin-Sulbactam (UNASYN) 3 g in sodium chloride 0.9 % 100 mL IVPB (not administered)     Initial Impression / Assessment and Plan / ED Course  I have reviewed the triage vital signs and the nursing notes.  Pertinent labs & imaging results that were available during my care of the patient were reviewed by me and considered in my medical decision making (see chart for details).     Appears to be cellulitis of the dorsum of the right hand extending to the right forearm.  Elevated blood sugar.  83 years old.  I think she'll benefit from observational admission with IV antibiotics to make sure that this infection continues to improve and is treated aggressively upfront  Lactate ordered by triage nursing.  Elevated but not clear how this fits into her picture given her normal blood pressure and heart rate.  We'll continue to monitor and this will need to be trended  Final Clinical Impressions(s) / ED Diagnoses   Final diagnoses:    Cellulitis of hand, right  Cellulitis of right forearm    New Prescriptions New Prescriptions   No medications on file     Azalia Bilis, MD 12/21/16 1135    Azalia Bilis, MD 12/21/16 1135

## 2016-12-21 NOTE — ED Triage Notes (Signed)
Pt was bit by her cat 2 days ago. Cat is up to date on it's shots. Site is red, swollen, and warm to touch.

## 2016-12-21 NOTE — Consult Note (Signed)
ORTHOPAEDIC CONSULTATION HISTORY & PHYSICAL REQUESTING PHYSICIAN: [facility]   Chief Complaint: right hand abscess  HPI: Cheryl Atkinson is a 83 y.o. female who presented to the emergency department earlier today,  2 days after a cat bite to the dorsum of her right hand.  She presented with extending erythema up to her proximal right forearm.  She reported warmth and redness of this area with pain.  She denied drainage or pus from the bite wound on the dorsum of the right hand.   She was admitted to the hospitalist service for initiation of treatment and further diagnostic testing. MRI scan has revealed at dorsal hand SQ abscess about 1 inch in diameter, and surgical consultation has been requested.  I have reviewed the imaging and clinical photos.  Past Medical History:  Diagnosis Date  . Anemia   . Arthritis    "shoulders" (05/04/2015)  . Cellulitis of right lower extremity 11/27/2013  . Chronic lower back pain   . Constipation   . GERD (gastroesophageal reflux disease)   . History of hiatal hernia   . Hypertension   . Osteoporosis   . Peripheral arterial disease (HCC)   . Restless leg syndrome   . Thyroid disease    Past Surgical History:  Procedure Laterality Date  . ABDOMINAL AORTAGRAM  05/04/2015   Procedure: Abdominal Aortagram;  Surgeon: Runell Gess, MD;  Location: [facility] INVASIVE CV LAB;  Service: Cardiovascular;;  . APPENDECTOMY    . CATARACT EXTRACTION W/ INTRAOCULAR LENS  IMPLANT, BILATERAL Bilateral   . PERIPHERAL VASCULAR CATHETERIZATION N/A 05/04/2015   Procedure: Lower Extremity Angiography;  Surgeon: Runell Gess, MD;  Location: [facility] INVASIVE CV LAB;  Service: Cardiovascular;  Laterality: N/A;  . PERIPHERAL VASCULAR CATHETERIZATION  05/04/2015   Procedure: Peripheral Vascular Intervention;  Surgeon: Runell Gess, MD;  Location: [facility] INVASIVE CV LAB;  Service: Cardiovascular;;  RCIA - 7x22 ICAST  . PERIPHERAL VASCULAR CATHETERIZATION Right 09/04/2015   Procedure: Peripheral Vascular Atherectomy;  Surgeon: Runell Gess, MD;  Location: [facility] INVASIVE CV LAB;  Service: Cardiovascular;  Laterality: Right;  SFA  . sfa Right 09/04/2015   de balloon  . THYROID SURGERY Right ?2013   "had goiter taken off my neck"  . TONSILLECTOMY    . VAGINAL HYSTERECTOMY     Social History   Social History  . Marital status: Widowed    Spouse name: N/A  . Number of children: N/A  . Years of education: N/A   Social History Main Topics  . Smoking status: Former Smoker    Packs/day: 0.50    Years: 60.00    Types: Cigarettes    Quit date: 04/02/2015  . Smokeless tobacco: Never Used  . Alcohol use No  . Drug use: No  . Sexual activity: No   Other Topics Concern  . None   Social History Narrative   Widowed 2013. 2 sons. 1 grandchild.       Retired from [employer].       Hobbies: time at home and with family      HCPOA: sister-in-law and brother. Deirdre Evener.       DNR/DNI      Regular exercise: none   Caffeine use: cup of coffee    Family History  Problem Relation Age of Onset  . Heart disease Father   . Coronary artery disease     Allergies  Allergen Reactions  . Codeine Other (See Comments)    REACTION: Syncope  Prior to Admission medications   Medication Sig Start Date End Date Taking? Authorizing Provider  aspirin EC 81 MG EC tablet Take 1 tablet (81 mg total) by mouth daily. 05/05/15  Yes Luke K Kilroy, PA-C  atorvastatin (LIPITOR) 40 MG tablet TAKE 1 TABLET DAILY AT 6 P.M. 07/09/16  Yes Runell Gess, MD  clopidogrel (PLAVIX) 75 MG tablet TAKE 1 TABLET DAILY WITH BREAKFAST 07/09/16  Yes Runell Gess, MD  CONZIP 300 MG CP24 Take 300 mg by mouth every morning. 12/13/16  Yes Historical Provider, MD  Multiple Vitamins-Minerals (PRESERVISION AREDS 2 PO) Take 1 tablet by mouth 2 (two) times daily.   Yes Historical Provider, MD  Olmesartan-Amlodipine-HCTZ (TRIBENZOR) 40-10-12.5 MG TABS Take 1 tablet by mouth every morning.  07/12/16  Yes Shelva Majestic, MD  polyethylene glycol [facility] / GLYCOLAX) packet Take 17 g by mouth at bedtime as needed for mild constipation. Reported on 03/14/2016   Yes Historical Provider, MD  rotigotine (NEUPRO) 2 MG/24HR APPLY 1 PATCH TO SKIN DAILY 07/12/16  Yes Shelva Majestic, MD  traMADol (ULTRAM) 50 MG tablet Take 50 mg by mouth at bedtime as needed for moderate pain.   Yes Historical Provider, MD   Mr Hand Right W Wo Contrast  Result Date: 12/21/2016 CLINICAL DATA:  83 year old female who presents to the emergency department 2 days after a cat bite to the dorsum of her right hand. She presents with extending erythema up to her proximal right forearm. EXAM: MRI OF THE RIGHT HAND WITHOUT AND WITH CONTRAST TECHNIQUE: Multiplanar, multisequence MR imaging of the right hand was performed before and after the administration of intravenous contrast. CONTRAST:  15mL MULTIHANCE GADOBENATE DIMEGLUMINE 529 MG/ML IV SOLN COMPARISON:  None. FINDINGS: Bones/Joint/Cartilage No focal marrow signal abnormality. No fracture or dislocation. Severe osteoarthritis of the first carpometacarpal joint. Mild osteoarthritis of the first CMC joint. Mild ulnar minus variance. Small distal radioulnar joint effusion. No periosteal reaction or bone destruction. Ligaments Collateral ligaments are intact. Muscles and Tendons Muscles are normal. Small amount of fluid and synovial enhancement of the extensor digitorum compartment at the level of the wrist. Remainder of the extensor compartment tendons are intact. Intact flexor compartment tendons. Soft tissues Normal carpal tunnel. Severe soft tissue edema along the dorsal aspect of the extending into the distal forearm with enhancement on postcontrast imaging. 0.7 x 2.2 x 2.2 cm complex fluid collection in the dorsal subcutaneous fat along the ulnar aspect of the hand at the level of the [facility] joints most concerning for an abscess. IMPRESSION: 1. Severe cellulitis of the dorsal  aspect of the hand and extends into the dorsal forearm. 0.7 x 2.2 x 2.2 cm abscess in the dorsal subcutaneous fat along the ulnar aspect of the hand at the level of the CMC joints. 2. Severe osteoarthritis of the first [facility] joint. Electronically Signed   By: Elige Ko   On: 12/21/2016 15:41    Positive ROS: All other systems have been reviewed and were otherwise negative with the exception of those mentioned in the HPI and as above.  Physical Exam: Vitals: Refer to EMR. Constitutional:  WD, WN, NAD HEENT:  NCAT, EOMI Neuro/Psych:  Alert & oriented to person, place, and time; appropriate mood & affect Lymphatic: No generalized extremity edema or lymphadenopathy Extremities / MSK:  The extremities are normal with respect to appearance, ranges of motion, joint stability, muscle strength/tone, sensation, & perfusion except as otherwise noted:  Right hand cellulitis has largely receeded.  Still with dorsal ulnar max TTP with fluctuance.  Increased pain with digital and wrist F/E, none with wrist RD/UD. Bite in center of area of fluctuance, no active drainage  Assessment: Right hand dorsal SQ abscess from cat bit 2 days ago  Plan: Patient will be made NPO after MN for planned I&D in the OR tomorrow.  G/R/O reviewed and consent obtained.   Following incision and drainage and irrigation, I anticipate leaving the wound open, with a packing in place for approx 48 hours.  At that time it will be pulled to allow healing via secondary intent.  Cliffton Astersavid A. Janee Mornhompson, MD      Orthopaedic & Hand Surgery [facility] [address] [place], [place]  [address] Office: [phone] Mobile: [phone]  12/21/2016, 6:32 PM

## 2016-12-22 ENCOUNTER — Encounter (HOSPITAL_COMMUNITY)
Admission: EM | Disposition: A | Discharge status: Home / Self Care | Payer: Self-pay | Source: Home / Self Care | Attending: Internal Medicine

## 2016-12-22 ENCOUNTER — Encounter (HOSPITAL_COMMUNITY): Payer: Self-pay | Admitting: Certified Registered"

## 2016-12-22 ENCOUNTER — Inpatient Hospital Stay (HOSPITAL_COMMUNITY): Payer: Medicare Other | Admitting: Critical Care Medicine

## 2016-12-22 DIAGNOSIS — R739 Hyperglycemia, unspecified: Secondary | ICD-10-CM

## 2016-12-22 HISTORY — PX: I&D EXTREMITY: SHX5045

## 2016-12-22 LAB — COMPREHENSIVE METABOLIC PANEL
ALT: 14 U/L (ref 14–54)
AST: 20 U/L (ref 15–41)
Albumin: 3.2 g/dL — ABNORMAL LOW (ref 3.5–5.0)
Alkaline Phosphatase: 91 U/L (ref 38–126)
Anion gap: 9 (ref 5–15)
BUN: 13 mg/dL (ref 6–20)
CO2: 23 mmol/L (ref 22–32)
Calcium: 8.7 mg/dL — ABNORMAL LOW (ref 8.9–10.3)
Chloride: 109 mmol/L (ref 101–111)
Creatinine, Ser: 0.72 mg/dL (ref 0.44–1.00)
GFR calc Af Amer: >60 mL/min (ref >60)
GFR calc non Af Amer: >60 mL/min (ref >60)
Glucose, Bld: 97 mg/dL (ref 65–99)
Potassium: 3.9 mmol/L (ref 3.5–5.1)
Sodium: 141 mmol/L (ref 135–145)
Total Bilirubin: 0.7 mg/dL (ref 0.3–1.2)
Total Protein: 6.6 g/dL (ref 6.5–8.1)

## 2016-12-22 LAB — CBC
HCT: 33.6 % — ABNORMAL LOW (ref 36.0–46.0)
Hemoglobin: 10.8 g/dL — ABNORMAL LOW (ref 12.0–15.0)
MCH: 28.3 pg (ref 26.0–34.0)
MCHC: 32.1 g/dL (ref 30.0–36.0)
MCV: 88 fL (ref 78.0–100.0)
Platelets: 252 10*3/uL (ref 150–400)
RBC: 3.82 MIL/uL — ABNORMAL LOW (ref 3.87–5.11)
RDW: 13.5 % (ref 11.5–15.5)
WBC: 11.4 10*3/uL — ABNORMAL HIGH (ref 4.0–10.5)

## 2016-12-22 LAB — HEMOGLOBIN A1C
Hgb A1c MFr Bld: 5.7 % — ABNORMAL HIGH (ref 4.8–5.6)
Mean Plasma Glucose: 117 mg/dL

## 2016-12-22 SURGERY — IRRIGATION AND DEBRIDEMENT EXTREMITY
Anesthesia: General | Site: Hand | Laterality: Right

## 2016-12-22 MED ORDER — PROPOFOL 10 MG/ML IV BOLUS
INTRAVENOUS | Status: DC | PRN
  Administered 2016-12-22: 120 mg via INTRAVENOUS

## 2016-12-22 MED ORDER — ONDANSETRON HCL 4 MG/2ML IJ SOLN: 4.0000 mg | Freq: Once | INTRAMUSCULAR | Status: DC | PRN

## 2016-12-22 MED ORDER — SUFENTANIL CITRATE 50 MCG/ML IV SOLN
INTRAVENOUS | Status: DC | PRN
  Administered 2016-12-22: 5 ug via INTRAVENOUS
  Administered 2016-12-22: 20 ug via INTRAVENOUS

## 2016-12-22 MED ORDER — BUPIVACAINE-EPINEPHRINE (PF) 0.5% -1:200000 IJ SOLN
INTRAMUSCULAR | Status: AC
  Filled 2016-12-22: qty 30

## 2016-12-22 MED ORDER — MEPERIDINE HCL 25 MG/ML IJ SOLN: 6.2500 mg | INTRAMUSCULAR | Status: DC | PRN

## 2016-12-22 MED ORDER — DEXAMETHASONE SODIUM PHOSPHATE 10 MG/ML IJ SOLN
INTRAMUSCULAR | Status: DC | PRN
  Administered 2016-12-22: 10 mg via INTRAVENOUS

## 2016-12-22 MED ORDER — LIDOCAINE HCL (PF) 1 % IJ SOLN
INTRAMUSCULAR | Status: AC
  Filled 2016-12-22: qty 30

## 2016-12-22 MED ORDER — LACTATED RINGERS IV SOLN
INTRAVENOUS | Status: DC
  Administered 2016-12-22: 09:00:00 via INTRAVENOUS

## 2016-12-22 MED ORDER — DEXAMETHASONE SODIUM PHOSPHATE 10 MG/ML IJ SOLN
INTRAMUSCULAR | Status: AC
  Filled 2016-12-22: qty 1

## 2016-12-22 MED ORDER — 0.9 % SODIUM CHLORIDE (POUR BTL) OPTIME
TOPICAL | Status: DC | PRN
  Administered 2016-12-22: 1000 mL

## 2016-12-22 MED ORDER — PROPOFOL 10 MG/ML IV BOLUS
INTRAVENOUS | Status: AC
  Filled 2016-12-22: qty 20

## 2016-12-22 MED ORDER — ROTIGOTINE 2 MG/24HR TD PT24
1.0000 | MEDICATED_PATCH | Freq: Every day | TRANSDERMAL | Status: DC
  Administered 2016-12-22 – 2016-12-23 (×2): 1 via TRANSDERMAL
  Filled 2016-12-22 (×4): qty 1

## 2016-12-22 MED ORDER — ONDANSETRON HCL 4 MG/2ML IJ SOLN
INTRAMUSCULAR | Status: DC | PRN
  Administered 2016-12-22: 4 mg via INTRAVENOUS

## 2016-12-22 MED ORDER — SODIUM CHLORIDE 0.9 % IJ SOLN
INTRAMUSCULAR | Status: AC
  Filled 2016-12-22: qty 10

## 2016-12-22 MED ORDER — SUFENTANIL CITRATE 50 MCG/ML IV SOLN
INTRAVENOUS | Status: AC
  Filled 2016-12-22: qty 1

## 2016-12-22 MED ORDER — LIDOCAINE HCL (CARDIAC) 20 MG/ML IV SOLN
INTRAVENOUS | Status: DC | PRN
  Administered 2016-12-22: 100 mg via INTRAVENOUS

## 2016-12-22 MED ORDER — HYDROMORPHONE HCL 1 MG/ML IJ SOLN: 0.2500 mg | INTRAMUSCULAR | Status: DC | PRN

## 2016-12-22 MED ORDER — BACITRACIN ZINC 500 UNIT/GM EX OINT
TOPICAL_OINTMENT | Freq: Two times a day (BID) | CUTANEOUS | Status: DC
  Administered 2016-12-23 – 2016-12-24 (×2): via TOPICAL
  Filled 2016-12-22: qty 28.35

## 2016-12-22 MED ORDER — LACTATED RINGERS IV SOLN
INTRAVENOUS | Status: DC | PRN
  Administered 2016-12-22: 09:00:00 via INTRAVENOUS

## 2016-12-22 MED ORDER — WHITE PETROLATUM GEL
Status: DC | PRN
  Filled 2016-12-22: qty 1

## 2016-12-22 MED ORDER — LIDOCAINE 2% (20 MG/ML) 5 ML SYRINGE
INTRAMUSCULAR | Status: AC
  Filled 2016-12-22: qty 5

## 2016-12-22 MED ORDER — ONDANSETRON HCL 4 MG/2ML IJ SOLN
INTRAMUSCULAR | Status: AC
  Filled 2016-12-22: qty 2

## 2016-12-22 SURGICAL SUPPLY — 45 items
BANDAGE COBAN STERILE 2 (GAUZE/BANDAGES/DRESSINGS) IMPLANT
BLADE MINI RND TIP GREEN BEAV (BLADE) IMPLANT
BLADE SURG 15 STRL LF DISP TIS (BLADE) ×1 IMPLANT
BLADE SURG 15 STRL SS (BLADE) ×2
BNDG COHESIVE 4X5 TAN STRL (GAUZE/BANDAGES/DRESSINGS) ×2 IMPLANT
BNDG GAUZE ELAST 4 BULKY (GAUZE/BANDAGES/DRESSINGS) ×3 IMPLANT
CHLORAPREP W/TINT 26ML (MISCELLANEOUS) ×2 IMPLANT
CORDS BIPOLAR (ELECTRODE) IMPLANT
COVER BACK TABLE 60X90IN (DRAPES) ×2 IMPLANT
COVER MAYO STAND STRL (DRAPES) ×2 IMPLANT
CUFF TOURNIQUET SINGLE 18IN (TOURNIQUET CUFF) IMPLANT
DRAPE HALF SHEET 40X57 (DRAPES) ×2 IMPLANT
DRAPE SURG 17X23 STRL (DRAPES) ×2 IMPLANT
DRSG EMULSION OIL 3X3 NADH (GAUZE/BANDAGES/DRESSINGS) ×2 IMPLANT
GAUZE IODOFORM PACK 1/2 7832 (GAUZE/BANDAGES/DRESSINGS) ×1 IMPLANT
GAUZE SPONGE 2X2 8PLY STRL LF (GAUZE/BANDAGES/DRESSINGS) IMPLANT
GAUZE SPONGE 4X4 12PLY STRL (GAUZE/BANDAGES/DRESSINGS) ×2 IMPLANT
GLOVE BIO SURGEON STRL SZ7.5 (GLOVE) ×2 IMPLANT
GLOVE BIOGEL PI IND STRL 7.0 (GLOVE) ×1 IMPLANT
GLOVE BIOGEL PI IND STRL 8 (GLOVE) ×1 IMPLANT
GLOVE BIOGEL PI INDICATOR 7.0 (GLOVE) ×1
GLOVE BIOGEL PI INDICATOR 8 (GLOVE) ×1
GLOVE ECLIPSE 6.5 STRL STRAW (GLOVE) ×2 IMPLANT
GOWN STRL REUS W/ TWL LRG LVL3 (GOWN DISPOSABLE) ×2 IMPLANT
GOWN STRL REUS W/TWL LRG LVL3 (GOWN DISPOSABLE) ×4
GOWN STRL REUS W/TWL XL LVL3 (GOWN DISPOSABLE) ×2 IMPLANT
NDL HYPO 25X1 1.5 SAFETY (NEEDLE) IMPLANT
NEEDLE HYPO 25X1 1.5 SAFETY (NEEDLE) IMPLANT
NS IRRIG 1000ML POUR BTL (IV SOLUTION) ×2 IMPLANT
PACK BASIN DAY SURGERY FS (CUSTOM PROCEDURE TRAY) ×2 IMPLANT
PADDING CAST ABS 4INX4YD NS (CAST SUPPLIES) ×1
PADDING CAST ABS COTTON 4X4 ST (CAST SUPPLIES) IMPLANT
RUBBERBAND STERILE (MISCELLANEOUS) IMPLANT
SET IRRIG Y TYPE TUR BLADDER L (SET/KITS/TRAYS/PACK) IMPLANT
SPONGE GAUZE 2X2 STER 10/PKG (GAUZE/BANDAGES/DRESSINGS) ×1
STOCKINETTE 6  STRL (DRAPES) ×1
STOCKINETTE 6 STRL (DRAPES) ×1 IMPLANT
SUT VICRYL RAPIDE 4-0 (SUTURE) IMPLANT
SUT VICRYL RAPIDE 4/0 PS 2 (SUTURE) ×1 IMPLANT
SWAB CULTURE ESWAB REG 1ML (MISCELLANEOUS) ×1 IMPLANT
SYR BULB 3OZ (MISCELLANEOUS) ×2 IMPLANT
SYRINGE 10CC LL (SYRINGE) IMPLANT
TOWEL OR 17X24 6PK STRL BLUE (TOWEL DISPOSABLE) ×2 IMPLANT
TOWEL OR NON WOVEN STRL DISP B (DISPOSABLE) ×2 IMPLANT
UNDERPAD 30X30 (UNDERPADS AND DIAPERS) ×2 IMPLANT

## 2016-12-22 NOTE — Progress Notes (Signed)
Patient ID: Cheryl Atkinson, female   DOB: Jan 21, 1931, 83 y.o.   MRN: [id number]                                                                PROGRESS NOTE                                                                                                                                                                                                             Patient Demographics:    Cheryl Atkinson, is a 83 y.o. female, DOB - 12/18/1930, WUJ:[id number]  Admit date - 12/21/2016   Admitting Physician Haydee Salter, MD  Outpatient Primary MD for the patient is Tana Conch, MD  LOS - 1  Outpatient Specialists:   Chief Complaint  Patient presents with  . Animal Bite       Brief Narrative   83 y.o. female with medical history significant for hypertension, COPD, peripheral vascular disease with lower extremity claudication, iron deficiency anemia, memory loss, renal artery stenosis and dyslipidemia. Patient reports being bitten by household pet/2 days ago. Since that time she's experienced progressive redness and swelling with development of significant pain. Because of the pain she's been unable to sleep. She's not had any fevers or chills. She has had nausea. Last tetanus shot within the past 10 years after a dog bite.    MRI showed cellulitis and abscess. Pt admitted for I and D   Subjective:    [name]  S/p I and D 12/22/2016  Has no complaints other than needing some petroleum for her lips.  Pt denies cp, palp, sob, n/v, diarrhea.     Assessment  & Plan :    Principal Problem:   Cellulitis of right hand Active Problems:   Iron deficiency anemia   Essential hypertension   COPD (chronic obstructive pulmonary disease) (HCC)   Hyperglycemia   Atherosclerotic PVD with intermittent claudication (HCC)   Hyperlipidemia   Renal artery stenosis (HCC)   Memory loss   Elevated lactic acid level   Acute hypokalemia   Cat bite of right hand    Cellulitis of right hand w  abscess S/p I and D 12/22/2016 [place]) Continue unasyn D#2 Awaiting wound culture Appreciate orthopedics    Hyperglycemia  -Recurrent issue as documented in PCP  notes -HgbA1c 6.2 in December Awaiting repeat A1c    Elevated lactic acid level -Initial greater than 4 but repeat 1.2 D/c ivf    Acute hypokalemia resolved    Iron deficiency anemia -Hemoglobin stable and at baseline 11.4    Essential hypertension -Recommendation from PCP was to keep blood pressure readings in the 130/60-70 range secondary to advanced age and potential for orthostasis -Current blood pressure controlled -Continue Norvasc -Hold ARB and thiazide diuretic acutely especially with need for IV contrast Probably can add tomorrow.     COPD (chronic obstructive pulmonary disease)  -Currently asymptomatic    Atherosclerotic PVD with intermittent claudication  -Patient reports recurrent issues with claudication -Has routine follow-up scheduled with Dr. Allyson Sabal -Continue low-dose aspirin and Plavix, statin    Hyperlipidemia -Continue statin    Renal artery stenosis  -Involves right renal artery and is also followed by Dr. Allyson Sabal -Renal function has remained stable on ARB according to PCP notes -Current renal function 0.93 with baseline 0.69 to 0.81    Memory loss/RLS -Continue Neupro -Not on dementia medications       Code Status : DNR  Family Communication  : w patient  Disposition Plan  : home  Barriers For Discharge :   Consults  :  orthopedics  Procedures  : I and D 1/21  DVT Prophylaxis  :  Lovenox - SCDs   Lab Results  Component Value Date   PLT 252 12/22/2016    Antibiotics  :    Anti-infectives    Start     Dose/Rate Route Frequency Ordered Stop   12/21/16 2145  Ampicillin-Sulbactam (UNASYN) 3 g in sodium chloride 0.9 % 100 mL IVPB     3 g 200 mL/hr over 30 Minutes Intravenous Every 6 hours 12/21/16 2132     12/21/16 1200  Ampicillin-Sulbactam (UNASYN) 3  g in sodium chloride 0.9 % 100 mL IVPB     3 g 200 mL/hr over 30 Minutes Intravenous  Once 12/21/16 1125 12/21/16 1245        Objective:   Vitals:   12/22/16 0559 12/22/16 0948 12/22/16 1000 12/22/16 1028  BP: (!) 145/48 (!) 156/75 (!) 159/76 131/86  Pulse: 72 74 71 78  Resp: 16 14 14    Temp: 99.2 F (37.3 C) 98.1 F (36.7 C)    TempSrc: Oral     SpO2: 100% 100% 100% 96%    Wt Readings from Last 3 Encounters:  11/14/16 77.5 kg (170 lb 12.8 oz)  07/12/16 77 kg (169 lb 12.8 oz)  05/22/16 76.7 kg (169 lb)     Intake/Output Summary (Last 24 hours) at 12/22/16 1204 Last data filed at 12/22/16 0938  Gross per 24 hour  Intake          2806.67 ml  Output                5 ml  Net          2801.67 ml     Physical Exam  Awake Alert, Oriented X 3, No new F.N deficits, Normal affect .AT,PERRAL Supple Neck,No JVD, No cervical lymphadenopathy appriciated.  Symmetrical Chest wall movement, Good air movement bilaterally, CTAB RRR,No Gallops,Rubs or new Murmurs, No Parasternal Heave +ve B.Sounds, Abd Soft, No tenderness, No organomegaly appriciated, No rebound - guarding or rigidity. No Cyanosis, Clubbing or edema, No new Rash or bruise      Data Review:    CBC  Recent Labs Lab 12/21/16 0954 12/22/16 0546  WBC  11.2* 11.4*  HGB 11.4* 10.8*  HCT 35.7* 33.6*  PLT 264 252  MCV 89.0 88.0  MCH 28.4 28.3  MCHC 31.9 32.1  RDW 13.8 13.5  LYMPHSABS 1.7  --   MONOABS 0.7  --   EOSABS 0.0  --   BASOSABS 0.0  --     Chemistries   Recent Labs Lab 12/21/16 0954 12/21/16 1813 12/22/16 0546  NA 136  --  141  K 3.0*  --  3.9  CL 101  --  109  CO2 23  --  23  GLUCOSE 231*  --  97  BUN 15  --  13  CREATININE 0.93  --  0.72  CALCIUM 8.9  --  8.7*  MG  --  2.3  --   AST 24  --  20  ALT 12*  --  14  ALKPHOS 100  --  91  BILITOT 0.9  --  0.7   ------------------------------------------------------------------------------------------------------------------ No  results for input(s): CHOL, HDL, LDLCALC, TRIG, CHOLHDL, LDLDIRECT in the last 72 hours.  Lab Results  Component Value Date   HGBA1C 6.2 11/14/2016   ------------------------------------------------------------------------------------------------------------------ No results for input(s): TSH, T4TOTAL, T3FREE, THYROIDAB in the last 72 hours.  Invalid input(s): FREET3 ------------------------------------------------------------------------------------------------------------------ No results for input(s): VITAMINB12, FOLATE, FERRITIN, TIBC, IRON, RETICCTPCT in the last 72 hours.  Coagulation profile No results for input(s): INR, PROTIME in the last 168 hours.  No results for input(s): DDIMER in the last 72 hours.  Cardiac Enzymes No results for input(s): CKMB, TROPONINI, MYOGLOBIN in the last 168 hours.  Invalid input(s): CK ------------------------------------------------------------------------------------------------------------------ No results found for: BNP  Inpatient Medications  Scheduled Meds: . amLODipine  10 mg Oral Daily  . ampicillin-sulbactam (UNASYN) IV  3 g Intravenous Q6H  . aspirin EC  81 mg Oral Daily  . atorvastatin  40 mg Oral q1800  . [START ON 12/23/2016] bacitracin   Topical BID  . clopidogrel  75 mg Oral Q breakfast  . rotigotine  1 patch Transdermal Daily  . sodium chloride flush  3 mL Intravenous Q12H   Continuous Infusions: . 0.9 % NaCl with KCl 20 mEq / L 100 mL/hr at 12/21/16 2121  . lactated ringers 10 mL/hr at 12/22/16 0852   PRN Meds:.acetaminophen **OR** acetaminophen, morphine injection, ondansetron **OR** ondansetron (ZOFRAN) IV, oxyCODONE  Micro Results Recent Results (from the past 240 hour(s))  Blood culture (routine x 2)     Status: None (Preliminary result)   Collection Time: 12/21/16 11:09 AM  Result Value Ref Range Status   Specimen Description BLOOD LEFT ANTECUBITAL  Final   Special Requests BOTTLES DRAWN AEROBIC AND ANAEROBIC  5CC  Final   Culture PENDING  Incomplete   Report Status PENDING  Incomplete  Blood culture (routine x 2)     Status: None (Preliminary result)   Collection Time: 12/21/16 11:33 AM  Result Value Ref Range Status   Specimen Description BLOOD RIGHT ANTECUBITAL  Final   Special Requests BOTTLES DRAWN AEROBIC AND ANAEROBIC 10CC  Final   Culture PENDING  Incomplete   Report Status PENDING  Incomplete    Radiology Reports Mr Hand Right W Wo Contrast  Result Date: 12/21/2016 CLINICAL DATA:  83 year old female who presents to the emergency department 2 days after a cat bite to the dorsum of her right hand. She presents with extending erythema up to her proximal right forearm. EXAM: MRI OF THE RIGHT HAND WITHOUT AND WITH CONTRAST TECHNIQUE: Multiplanar, multisequence MR imaging of the  right hand was performed before and after the administration of intravenous contrast. CONTRAST:  15mL MULTIHANCE GADOBENATE DIMEGLUMINE 529 MG/ML IV SOLN COMPARISON:  None. FINDINGS: Bones/Joint/Cartilage No focal marrow signal abnormality. No fracture or dislocation. Severe osteoarthritis of the first carpometacarpal joint. Mild osteoarthritis of the first CMC joint. Mild ulnar minus variance. Small distal radioulnar joint effusion. No periosteal reaction or bone destruction. Ligaments Collateral ligaments are intact. Muscles and Tendons Muscles are normal. Small amount of fluid and synovial enhancement of the extensor digitorum compartment at the level of the wrist. Remainder of the extensor compartment tendons are intact. Intact flexor compartment tendons. Soft tissues Normal carpal tunnel. Severe soft tissue edema along the dorsal aspect of the extending into the distal forearm with enhancement on postcontrast imaging. 0.7 x 2.2 x 2.2 cm complex fluid collection in the dorsal subcutaneous fat along the ulnar aspect of the hand at the level of the [facility] joints most concerning for an abscess. IMPRESSION: 1. Severe cellulitis of  the dorsal aspect of the hand and extends into the dorsal forearm. 0.7 x 2.2 x 2.2 cm abscess in the dorsal subcutaneous fat along the ulnar aspect of the hand at the level of the CMC joints. 2. Severe osteoarthritis of the first [facility] joint. Electronically Signed   By: Elige KoHetal  Patel   On: 12/21/2016 15:41    Time Spent in minutes  30   Pearson GrippeJames Lean Jaeger M.D on 12/22/2016 at 12:04 PM  Between 7am to 7pm - Pager - [phone]  After 7pm go to [email or web address] - password [email or web address]  [facility] -  Office  [phone]

## 2016-12-22 NOTE — Anesthesia Preprocedure Evaluation (Signed)
Anesthesia Evaluation  Patient identified by MRN, date of birth, ID band Patient awake    Reviewed: Allergy & Precautions, NPO status , Patient's Chart, lab work & pertinent test results  Airway Mallampati: I  TM Distance: >3 FB Neck ROM: Full    Dental   Pulmonary COPD, former smoker,    Pulmonary exam normal        Cardiovascular hypertension, Pt. on medications Normal cardiovascular exam     Neuro/Psych    GI/Hepatic GERD  Medicated and Controlled,  Endo/Other    Renal/GU      Musculoskeletal   Abdominal   Peds  Hematology   Anesthesia Other Findings   Reproductive/Obstetrics                             Anesthesia Physical Anesthesia Plan  ASA: III  Anesthesia Plan: General   Post-op Pain Management:    Induction: Intravenous  Airway Management Planned: LMA  Additional Equipment:   Intra-op Plan:   Post-operative Plan: Extubation in OR  Informed Consent: I have reviewed the patients History and Physical, chart, labs and discussed the procedure including the risks, benefits and alternatives for the proposed anesthesia with the patient or authorized representative who has indicated his/her understanding and acceptance.     Plan Discussed with: CRNA and Surgeon  Anesthesia Plan Comments:         Anesthesia Quick Evaluation

## 2016-12-22 NOTE — Progress Notes (Signed)
Paged for SCDs twice on shift. Not received yet. Day RN aware.

## 2016-12-22 NOTE — Anesthesia Postprocedure Evaluation (Signed)
Anesthesia Post Note  Patient: Cheryl ReeseBetty L Atkinson  Procedure(s) Performed: Procedure(s) (LRB): IRRIGATION AND DEBRIDEMENT EXTREMITY (Right)  Patient location during evaluation: PACU Anesthesia Type: General Level of consciousness: awake and alert Pain management: pain level controlled Vital Signs Assessment: post-procedure vital signs reviewed and stable Respiratory status: spontaneous breathing, nonlabored ventilation, respiratory function stable and patient connected to nasal cannula oxygen Cardiovascular status: blood pressure returned to baseline and stable Postop Assessment: no signs of nausea or vomiting Anesthetic complications: no       Last Vitals:  Vitals:   12/22/16 1000 12/22/16 1028  BP: (!) 159/76 131/86  Pulse: 71 78  Resp: 14   Temp:      Last Pain:  Vitals:   12/22/16 1028  TempSrc:   PainSc: 10-Worst pain ever                 Melisha Eggleton DAVID

## 2016-12-22 NOTE — Op Note (Signed)
12/21/2016 - 12/22/2016  10:05 AM  PATIENT:  Cheryl ReeseBetty L Honeywell  83 y.o. female  PRE-OPERATIVE DIAGNOSIS:  Right hand dorsal SQ abscess  POST-OPERATIVE DIAGNOSIS:  Same  PROCEDURE:  Incision and drainage with packing, right hand dorsal subcutaneous abscess  SURGEON: Cliffton Astersavid A. Janee Mornhompson, MD  PHYSICIAN ASSISTANT: None  ANESTHESIA:  general  SPECIMENS:  None  DRAINS:   None  EBL:  less than 50 mL  PREOPERATIVE INDICATIONS:  Cheryl ReeseBetty L Weisgerber is a  83 y.o. female with MRI confirmed 2 x 2 centimeters right hand dorsal subcutaneous abscess  The risks benefits and alternatives were discussed with the patient preoperatively including but not limited to the risks of infection, bleeding, nerve injury, cardiopulmonary complications, the need for revision surgery, among others, and the patient verbalized understanding and consented to proceed.  OPERATIVE IMPLANTS: None  OPERATIVE PROCEDURE:  After receiving prophylactic antibiotics, the patient was escorted to the operative theatre and placed in a supine position.  General anesthesia was administered.  A surgical "time-out" was performed during which the planned procedure, proposed operative site, and the correct patient identity were compared to the operative consent and agreement confirmed by the circulating nurse according to current facility policy.  Following application of a tourniquet to the operative extremity, the exposed skin was prepped with Chloraprep and draped in the usual sterile fashion.  The limb was exsanguinated with gravity and the tourniquet inflated to approximately 100mmHg higher than systolic BP.  The region where the skin wound was located was incised longitudinally for about 1-1/2 inches.  Cultures were obtained.  There was some fluid, but it wasn't grossly purulent.  The subcutaneous space was opened with digital dissection and then copiously irrigated.  There did not appear to be any breach in the dorsal wrist capsule.  Wrist  movement passively was smooth and fluid.  There did not appear to be any tightness.  After copiously irrigating the wound, the tourniquet was released, some additional hemostasis obtained, and the proximal and distal aspects of the incision was closed with 4-0 Vicryl Rapide interrupted sutures.  Iodoform packing was placed into the subcutaneous space adjacent to the central portion of the open wound.  A bulky dressing was applied and she was awakened and taken to recovery room stable condition, breathing spontaneously  DISPOSITION: She'll be transferred back to the floor for continued medical management of her infection.

## 2016-12-22 NOTE — Progress Notes (Signed)
Dorsal hand abcess incision and drainage performed.  Packing placed.  Dressing applied.  Orders written for nursing to remove dressing and pull the packing Amaro evening, and then begin daily wound care.  Wound care should consist of cleansing the wound with soap and running water in the sink with a shower, applying a dab of bacitracin into the open wound, and a light gauze dressing.  She may be discharged from the hand surgical perspective tomorrow evening after the first dressing change on an appropriate oral antibiotic regimen.  The sutures are absorbable, and will follow away on the wound.  The wound should heal uneventfully through secondary healing.  F/u with me in 2 weeks for wound healing check and determination of whether she will need hand therapy.  Neil Crouchave Fara Worthy, MD Hand Surgery  Mobile  [phone]

## 2016-12-22 NOTE — Discharge Instructions (Signed)
Wound Care: Cleansed the wound with soap and running water daily, applying a dab of bacitracin ointment into the open wound, covering with a light gauze dressing.  Do this until the wound is fully healed.

## 2016-12-22 NOTE — Transfer of Care (Signed)
Immediate Anesthesia Transfer of Care Note  Patient: Cheryl Atkinson  Procedure(s) Performed: Procedure(s): IRRIGATION AND DEBRIDEMENT EXTREMITY (Right)  Patient Location: PACU  Anesthesia Type:General  Level of Consciousness: awake, oriented and patient cooperative  Airway & Oxygen Therapy: Patient Spontanous Breathing and Patient connected to nasal cannula oxygen  Post-op Assessment: Report given to RN, Post -op Vital signs reviewed and stable and Patient moving all extremities X 4  Post vital signs: Reviewed and stable  Last Vitals:  Vitals:   12/22/16 0559 12/22/16 0948  BP: (!) 145/48 (!) 156/75  Pulse: 72 74  Resp: 16 14  Temp: 37.3 C 36.7 C    Last Pain:  Vitals:   12/22/16 0559  TempSrc: Oral  PainSc:          Complications: No apparent anesthesia complications

## 2016-12-22 NOTE — Anesthesia Procedure Notes (Addendum)
Procedure Name: LMA Insertion Date/Time: 12/22/2016 9:16 AM Performed by: Melina SchoolsBANKS, Zulema [place] J Pre-anesthesia Checklist: Patient identified, Emergency Drugs available, Suction available, Patient being monitored and Timeout performed Patient Re-evaluated:Patient Re-evaluated prior to inductionOxygen Delivery Method: Circle system utilized Preoxygenation: Pre-oxygenation with 100% oxygen Intubation Type: IV induction Ventilation: Mask ventilation without difficulty LMA Size: 4.0 Number of attempts: 1 Placement Confirmation: positive ETCO2 and breath sounds checked- equal and bilateral Tube secured with: Tape Dental Injury: Teeth and Oropharynx as per pre-operative assessment

## 2016-12-23 ENCOUNTER — Encounter (HOSPITAL_COMMUNITY): Payer: Self-pay | Admitting: Orthopedic Surgery

## 2016-12-23 DIAGNOSIS — E785 Hyperlipidemia, unspecified: Secondary | ICD-10-CM

## 2016-12-23 DIAGNOSIS — I10 Essential (primary) hypertension: Secondary | ICD-10-CM

## 2016-12-23 DIAGNOSIS — E876 Hypokalemia: Secondary | ICD-10-CM

## 2016-12-23 DIAGNOSIS — I701 Atherosclerosis of renal artery: Secondary | ICD-10-CM

## 2016-12-23 DIAGNOSIS — L03113 Cellulitis of right upper limb: Secondary | ICD-10-CM

## 2016-12-23 DIAGNOSIS — I70219 Atherosclerosis of native arteries of extremities with intermittent claudication, unspecified extremity: Secondary | ICD-10-CM

## 2016-12-23 DIAGNOSIS — R413 Other amnesia: Secondary | ICD-10-CM

## 2016-12-23 DIAGNOSIS — J438 Other emphysema: Secondary | ICD-10-CM

## 2016-12-23 LAB — COMPREHENSIVE METABOLIC PANEL
ALT: 13 U/L — ABNORMAL LOW (ref 14–54)
AST: 19 U/L (ref 15–41)
Albumin: 3.1 g/dL — ABNORMAL LOW (ref 3.5–5.0)
Alkaline Phosphatase: 80 U/L (ref 38–126)
Anion gap: 8 (ref 5–15)
BUN: 10 mg/dL (ref 6–20)
CO2: 22 mmol/L (ref 22–32)
Calcium: 8.5 mg/dL — ABNORMAL LOW (ref 8.9–10.3)
Chloride: 112 mmol/L — ABNORMAL HIGH (ref 101–111)
Creatinine, Ser: 0.7 mg/dL (ref 0.44–1.00)
GFR calc Af Amer: >60 mL/min (ref >60)
GFR calc non Af Amer: >60 mL/min (ref >60)
Glucose, Bld: 116 mg/dL — ABNORMAL HIGH (ref 65–99)
Potassium: 4 mmol/L (ref 3.5–5.1)
Sodium: 142 mmol/L (ref 135–145)
Total Bilirubin: 0.3 mg/dL (ref 0.3–1.2)
Total Protein: 6.3 g/dL — ABNORMAL LOW (ref 6.5–8.1)

## 2016-12-23 LAB — CBC
HCT: 28.9 % — ABNORMAL LOW (ref 36.0–46.0)
Hemoglobin: 9.2 g/dL — ABNORMAL LOW (ref 12.0–15.0)
MCH: 27.9 pg (ref 26.0–34.0)
MCHC: 31.8 g/dL (ref 30.0–36.0)
MCV: 87.6 fL (ref 78.0–100.0)
Platelets: 224 10*3/uL (ref 150–400)
RBC: 3.3 MIL/uL — ABNORMAL LOW (ref 3.87–5.11)
RDW: 13.6 % (ref 11.5–15.5)
WBC: 12.5 10*3/uL — ABNORMAL HIGH (ref 4.0–10.5)

## 2016-12-23 LAB — PROCALCITONIN: Procalcitonin: <0.1 ng/mL

## 2016-12-23 NOTE — Progress Notes (Signed)
Clinically seems to be greatly improved. The wound should heal uneventfully through secondary healing. Continue wound care as outlined and  F/u with me in 2 weeks for wound healing check and determination of whether she will need hand therapy.  Neil Crouchave Mendy Chou, MD Hand Surgery  [place]  [phone]

## 2016-12-23 NOTE — Progress Notes (Signed)
Patient ID: Cheryl Atkinson, female   DOB: 08/18/1931, 83 y.o.   MRN: [id number]                                                                PROGRESS NOTE                                                                                                                                                                                                             Patient Demographics:    Cheryl Atkinson, is a 83 y.o. female, DOB - 01/01/1931, WUJ:[id number]  Admit date - 12/21/2016   Admitting Physician Haydee Salter, MD  Outpatient Primary MD for the patient is Tana Conch, MD  LOS - 2  Outpatient Specialists:   Chief Complaint  Patient presents with  . Animal Bite       Brief Narrative   83 y.o. female with medical history significant for hypertension, COPD, peripheral vascular disease with lower extremity claudication, iron deficiency anemia, memory loss, renal artery stenosis and dyslipidemia. Patient reports being bitten by household pet/2 days ago. Since that time she's experienced progressive redness and swelling with development of significant pain. Because of the pain she's been unable to sleep. She's not had any fevers or chills. She has had nausea. Last tetanus shot within the past 10 years after a dog bite.    MRI showed cellulitis and abscess. Pt admitted for I and D   Subjective:   Denies any fever or chills, denies any symptoms, per orthopedic recommendation changes of the dressing later today.    Assessment  & Plan :    Principal Problem:   Cellulitis of right hand Active Problems:   Iron deficiency anemia   Essential hypertension   COPD (chronic obstructive pulmonary disease) (HCC)   Hyperglycemia   Atherosclerotic PVD with intermittent claudication (HCC)   Hyperlipidemia   Renal artery stenosis (HCC)   Memory loss   Elevated lactic acid level   Acute hypokalemia   Cat bite of right hand   Cellulitis of right hand w abscess S/p I and D 12/22/2016 [place]) Continue unasyn D#3 Awaiting wound culture Appreciate orthopedics help, weight 41 dressing changes discharge in a.m. on Augmentin.   Hyperglycemia  -Recurrent issue as documented in PCP notes -Hemoglobin A1c was 6.2  in December, repeat A1c is 5.7.    Elevated lactic acid level -Initial greater than 4 but repeat 1.2 D/c ivf    Acute hypokalemia resolved    Iron deficiency anemia -Hemoglobin stable and at baseline 11.4    Essential hypertension -Recommendation from PCP was to keep blood pressure readings in the 130/60-70 range secondary to advanced age and potential for orthostasis -Current blood pressure controlled -Continue Norvasc -Hold ARB and thiazide diuretic acutely especially with need for IV contrast Probably can add tomorrow.     COPD (chronic obstructive pulmonary disease)  -Currently asymptomatic    Atherosclerotic PVD with intermittent claudication  -Patient reports recurrent issues with claudication -Has routine follow-up scheduled with Dr. Allyson Sabal -Continue low-dose aspirin and Plavix, statin    Hyperlipidemia -Continue statin    Renal artery stenosis  -Involves right renal artery and is also followed by Dr. Allyson Sabal -Renal function has remained stable on ARB according to PCP notes -Current renal function 0.93 with baseline 0.69 to 0.81    Memory loss/RLS -Continue Neupro -Not on dementia medications    Code Status : DNR Family Communication  : w patient Disposition Plan  : home Barriers For Discharge :  Consults  :  orthopedics Procedures  : I and D 1/21 DVT Prophylaxis  :  Lovenox - SCDs   Lab Results  Component Value Date   PLT 224 12/23/2016    Antibiotics  :    Anti-infectives    Start     Dose/Rate Route Frequency Ordered Stop   12/21/16 2145  Ampicillin-Sulbactam (UNASYN) 3 g in sodium chloride 0.9 % 100 mL IVPB     3 g 200 mL/hr over 30 Minutes Intravenous Every 6 hours 12/21/16 2132     12/21/16 1200   Ampicillin-Sulbactam (UNASYN) 3 g in sodium chloride 0.9 % 100 mL IVPB     3 g 200 mL/hr over 30 Minutes Intravenous  Once 12/21/16 1125 12/21/16 1245        Objective:   Vitals:   12/22/16 2044 12/23/16 0012 12/23/16 0405 12/23/16 0846  BP: 137/64 (!) 147/74 (!) 131/54 (!) 160/61  Pulse: 75 73 71   Resp: 16 18 18    Temp: 98.4 F (36.9 C) 97.9 F (36.6 C) 98.2 F (36.8 C)   TempSrc: Oral Oral Oral   SpO2: 96% 100% 100%     Wt Readings from Last 3 Encounters:  11/14/16 77.5 kg (170 lb 12.8 oz)  07/12/16 77 kg (169 lb 12.8 oz)  05/22/16 76.7 kg (169 lb)     Intake/Output Summary (Last 24 hours) at 12/23/16 1359 Last data filed at 12/23/16 0900  Gross per 24 hour  Intake          1519.67 ml  Output                0 ml  Net          1519.67 ml     Physical Exam  Awake Alert, Oriented X 3, No new F.N deficits, Normal affect [place].AT,PERRAL Supple Neck,No JVD, No cervical lymphadenopathy appriciated.  Symmetrical Chest wall movement, Good air movement bilaterally, CTAB RRR,No Gallops,Rubs or new Murmurs, No Parasternal Heave +ve B.Sounds, Abd Soft, No tenderness, No organomegaly appriciated, No rebound - guarding or rigidity. No Cyanosis, Clubbing or edema, No new Rash or bruise      Data Review:    CBC  Recent Labs Lab 12/21/16 0954 12/22/16 0546 12/23/16 0447  WBC 11.2* 11.4* 12.5*  HGB 11.4*  10.8* 9.2*  HCT 35.7* 33.6* 28.9*  PLT 264 252 224  MCV 89.0 88.0 87.6  MCH 28.4 28.3 27.9  MCHC 31.9 32.1 31.8  RDW 13.8 13.5 13.6  LYMPHSABS 1.7  --   --   MONOABS 0.7  --   --   EOSABS 0.0  --   --   BASOSABS 0.0  --   --     Chemistries   Recent Labs Lab 12/21/16 0954 12/21/16 1813 12/22/16 0546 12/23/16 0447  NA 136  --  141 142  K 3.0*  --  3.9 4.0  CL 101  --  109 112*  CO2 23  --  23 22  GLUCOSE 231*  --  97 116*  BUN 15  --  13 10  CREATININE 0.93  --  0.72 0.70  CALCIUM 8.9  --  8.7* 8.5*  MG  --  2.3  --   --   AST 24  --  20 19  ALT  12*  --  14 13*  ALKPHOS 100  --  91 80  BILITOT 0.9  --  0.7 0.3   ------------------------------------------------------------------------------------------------------------------ No results for input(s): CHOL, HDL, LDLCALC, TRIG, CHOLHDL, LDLDIRECT in the last 72 hours.  Lab Results  Component Value Date   HGBA1C 5.7 (H) 12/21/2016   ------------------------------------------------------------------------------------------------------------------ No results for input(s): TSH, T4TOTAL, T3FREE, THYROIDAB in the last 72 hours.  Invalid input(s): FREET3 ------------------------------------------------------------------------------------------------------------------ No results for input(s): VITAMINB12, FOLATE, FERRITIN, TIBC, IRON, RETICCTPCT in the last 72 hours.  Coagulation profile No results for input(s): INR, PROTIME in the last 168 hours.  No results for input(s): DDIMER in the last 72 hours.  Cardiac Enzymes No results for input(s): CKMB, TROPONINI, MYOGLOBIN in the last 168 hours.  Invalid input(s): CK ------------------------------------------------------------------------------------------------------------------ No results found for: BNP  Inpatient Medications  Scheduled Meds: . amLODipine  10 mg Oral Daily  . ampicillin-sulbactam (UNASYN) IV  3 g Intravenous Q6H  . aspirin EC  81 mg Oral Daily  . atorvastatin  40 mg Oral q1800  . bacitracin   Topical BID  . clopidogrel  75 mg Oral Q breakfast  . rotigotine  1 patch Transdermal Daily  . sodium chloride flush  3 mL Intravenous Q12H   Continuous Infusions: . 0.9 % NaCl with KCl 20 mEq / L 100 mL/hr at 12/22/16 2111  . lactated ringers 10 mL/hr at 12/22/16 0852   PRN Meds:.acetaminophen **OR** acetaminophen, morphine injection, ondansetron **OR** ondansetron (ZOFRAN) IV, oxyCODONE, white petrolatum  Micro Results Recent Results (from the past 240 hour(s))  Blood culture (routine x 2)     Status: None  (Preliminary result)   Collection Time: 12/21/16 11:09 AM  Result Value Ref Range Status   Specimen Description BLOOD LEFT ANTECUBITAL  Final   Special Requests BOTTLES DRAWN AEROBIC AND ANAEROBIC 5CC  Final   Culture PENDING  Incomplete   Report Status PENDING  Incomplete  Blood culture (routine x 2)     Status: None (Preliminary result)   Collection Time: 12/21/16 11:33 AM  Result Value Ref Range Status   Specimen Description BLOOD RIGHT ANTECUBITAL  Final   Special Requests BOTTLES DRAWN AEROBIC AND ANAEROBIC 10CC  Final   Culture NO GROWTH 1 DAY  Final   Report Status PENDING  Incomplete  Aerobic/Anaerobic Culture (surgical/deep wound)     Status: None (Preliminary result)   Collection Time: 12/22/16  9:35 AM  Result Value Ref Range Status   Specimen Description ABSCESS HAND RIGHT  Final   Special Requests NONE  Final   Gram Stain   Final    MODERATE WBC PRESENT, PREDOMINANTLY PMN NO ORGANISMS SEEN    Culture NO GROWTH 1 DAY  Final   Report Status PENDING  Incomplete    Radiology Reports Mr Hand Right W Wo Contrast  Result Date: 12/21/2016 CLINICAL DATA:  83 year old female who presents to the emergency department 2 days after a cat bite to the dorsum of her right hand. She presents with extending erythema up to her proximal right forearm. EXAM: MRI OF THE RIGHT HAND WITHOUT AND WITH CONTRAST TECHNIQUE: Multiplanar, multisequence MR imaging of the right hand was performed before and after the administration of intravenous contrast. CONTRAST:  15mL MULTIHANCE GADOBENATE DIMEGLUMINE 529 MG/ML IV SOLN COMPARISON:  None. FINDINGS: Bones/Joint/Cartilage No focal marrow signal abnormality. No fracture or dislocation. Severe osteoarthritis of the first carpometacarpal joint. Mild osteoarthritis of the first CMC joint. Mild ulnar minus variance. Small distal radioulnar joint effusion. No periosteal reaction or bone destruction. Ligaments Collateral ligaments are intact. Muscles and Tendons  Muscles are normal. Small amount of fluid and synovial enhancement of the extensor digitorum compartment at the level of the wrist. Remainder of the extensor compartment tendons are intact. Intact flexor compartment tendons. Soft tissues Normal carpal tunnel. Severe soft tissue edema along the dorsal aspect of the extending into the distal forearm with enhancement on postcontrast imaging. 0.7 x 2.2 x 2.2 cm complex fluid collection in the dorsal subcutaneous fat along the ulnar aspect of the hand at the level of the [facility] joints most concerning for an abscess. IMPRESSION: 1. Severe cellulitis of the dorsal aspect of the hand and extends into the dorsal forearm. 0.7 x 2.2 x 2.2 cm abscess in the dorsal subcutaneous fat along the ulnar aspect of the hand at the level of the CMC joints. 2. Severe osteoarthritis of the first [facility] joint. Electronically Signed   By: Elige KoHetal  Patel   On: 12/21/2016 15:41    Time Spent in minutes  30   Cheryl Atkinson A M.D on 12/23/2016 at 1:59 PM  Between 7am to 7pm - Pager - [phone]  After 7pm go to [email or web address] - password [email or web address]  [facility] -  Office  [phone]

## 2016-12-23 NOTE — Consult Note (Signed)
[facility] CM Primary Care Navigator  12/23/2016  Cheryl Atkinson January 24, 1931 [id number]  Met with patient at the bedside to identify possible discharge needs. Patient reports that her Siamese cat had bitten her hand when she was playing with it that had led to this admission.   Patient endorses Dr. Garret Reddish with [facility] [place] as the primary care provider.    Patient shared using [facility] [place] Mail order service to obtain medications without difficulty.  Patient states managing her own medications at home straight out of the containers.   She reports being able to drive to her doctors' appointments prior to admission. Her son Cheryl Atkinson) and sister in-law Cheryl Atkinson) will be able to provide transportation once she discharges per patient.  Patient states that son lives with her and he will be the primary caregiver at home.   Discharge plan is home per patient.  Patient voiced understanding to call primary care provider's office when she gets home, for a post discharge follow-up appointment within a week or sooner if needs arise. Patient letter provided as a reminder.  Patient denies any care coordination needs and disease management (related to COPD) at this time. [facility] care management contact information provided for future needs that may arise.   For additional questions please contact:  Edwena Felty A. Champ Keetch, BSN, RN-BC [facility] PRIMARY CARE Navigator Cell: [phone]

## 2016-12-23 NOTE — Progress Notes (Addendum)
Pt has been awake most of the night. She continues to complain of restless leg syndrome. Because of her fall, she has not been able to stand and move at the bedside as she usually does at home. She has been up to BR at least every two hours. She has walked in the room, sat in the chair and bathed at the sink. She has no pain or bruising from fall.   Son, Raymondo BandGreg Faries notified of mother's fall.

## 2016-12-23 NOTE — Progress Notes (Signed)
Pt got up to side of bed and tried to stand and fell beside bed. She has been very restless most of the evening. Walked in the hall earlier in order to help her restlessness. No injuries noted; no pain reported; no bruising observed. Vital signs stable.

## 2016-12-23 NOTE — Progress Notes (Signed)
Followed wound order.  Removed dressing from right hand, pulled out packing.  Washed hand, applied Bacitracin, covered with gauze.

## 2016-12-24 ENCOUNTER — Inpatient Hospital Stay (HOSPITAL_COMMUNITY): Admission: RE | Admit: 2016-12-24 | Payer: Medicare Other | Source: Ambulatory Visit

## 2016-12-24 ENCOUNTER — Encounter (HOSPITAL_COMMUNITY): Payer: Self-pay

## 2016-12-24 DIAGNOSIS — S61451D Open bite of right hand, subsequent encounter: Secondary | ICD-10-CM

## 2016-12-24 DIAGNOSIS — W5501XD Bitten by cat, subsequent encounter: Secondary | ICD-10-CM

## 2016-12-24 MED ORDER — AMOXICILLIN-POT CLAVULANATE 875-125 MG PO TABS: 1.0000 | ORAL_TABLET | Freq: Two times a day (BID) | ORAL | 0 refills | Status: DC

## 2016-12-24 MED ORDER — BACITRACIN ZINC 500 UNIT/GM EX OINT: TOPICAL_OINTMENT | Freq: Two times a day (BID) | CUTANEOUS | 0 refills | Status: DC

## 2016-12-24 NOTE — Discharge Summary (Signed)
Physician Discharge Summary  AAYAT HAJJAR ZOX:[id number] DOB: Aug 22, 1931 DOA: 12/21/2016  PCP: Tana Conch, MD  Admit date: 12/21/2016 Discharge date: 12/24/2016  Admitted From: Home Disposition: Home  Recommendations for Outpatient Follow-up:  1. Follow up with PCP in 1-2 weeks 2. Please obtain BMP/CBC in one week.  Home Health: NA Equipment/Devices:NA  Discharge Condition: Stable CODE STATUS: DNR Diet recommendation: Diet Heart Room service appropriate? Yes; Fluid consistency: Thin  Brief/Interim Summary: 83 y.o.femalewith medical history significant for hypertension, COPD, peripheral vascular disease with lower extremity claudication, iron deficiency anemia, memory loss, renal artery stenosis and dyslipidemia. Patient reports being bitten by household pet/2 days ago. Since that time she's experienced progressive redness and swelling with development of significant pain. Because of the pain she's been unable to sleep. She's not had any fevers or chills. She has had nausea. Last tetanus shot within the past 10 years after a dog bite.    MRI showed cellulitis and abscess. Pt admitted for I and D  Discharge Diagnoses:  Principal Problem:   Cellulitis of right hand Active Problems:   Iron deficiency anemia   Essential hypertension   COPD (chronic obstructive pulmonary disease) (HCC)   Hyperglycemia   Atherosclerotic PVD with intermittent claudication (HCC)   Hyperlipidemia   Renal artery stenosis (HCC)   Memory loss   Elevated lactic acid level   Acute hypokalemia   Cat bite of right hand   Cellulitis of right hand w abscess S/p I and D 12/22/2016 [place]) Patient reported encephalitis of right hand after cat bite. Admitted to the hospital seen by Dr. Janee Morn of hand surgery and I&D was done on 12/22/2016. Dressing was changed while she is in the hospital, patient to follow-up with Dr. Janee Morn in the office. Discharge on Augmentin for 7 more days and  bacitracin topically.  Hyperglycemia -Recurrent issue as documented in PCP notes -Hemoglobin A1c was 6.2 in December, repeat A1c is 5.7.  Elevated lactic acid level -Initial greater than 4 but repeat 1.2.  Acute hypokalemia resolved  Iron deficiency anemia -Hemoglobin stable and at baseline 11.4  Essential hypertension -Recommendation from PCP was to keep blood pressure readings in the 130/60-70 range secondary to advanced age and potential for orthostasis -Current blood pressure controlled -Continue Norvasc -Home medications restarted on discharge.  COPD (chronic obstructive pulmonary disease)  -Currently asymptomatic  Atherosclerotic PVD with intermittent claudication  -Patient reports recurrent issues with claudication -Has routine follow-up scheduled with Dr. Allyson Sabal -Continue low-dose aspirin and Plavix,statin  Hyperlipidemia -Continue statin  Renal artery stenosis -Involves right renal artery and is also followed by Dr. Allyson Sabal -Renal function has remained stable on ARB according to PCP notes -Current renal function 0.93 with baseline 0.69 to 0.81  Memory loss/RLS -Continue Neupro -Not on dementia medications   Discharge Instructions   Allergies as of 12/24/2016      Reactions   Codeine Other (See Comments)   REACTION: Syncope       Medication List    TAKE these medications   amoxicillin-clavulanate 875-125 MG tablet Commonly known as:  AUGMENTIN Take 1 tablet by mouth 2 (two) times daily.   aspirin 81 MG EC tablet Take 1 tablet (81 mg total) by mouth daily.   atorvastatin 40 MG tablet Commonly known as:  LIPITOR TAKE 1 TABLET DAILY AT 6 P.M.   bacitracin ointment Apply topically 2 (two) times daily.   clopidogrel 75 MG tablet Commonly known as:  PLAVIX TAKE 1 TABLET DAILY WITH BREAKFAST   traMADol 50  MG tablet Commonly known as:  ULTRAM Take 50 mg by mouth at bedtime as needed for moderate pain.   CONZIP 300 MG Cp24 Generic  drug:  TraMADol HCl Take 300 mg by mouth every morning.   Olmesartan-Amlodipine-HCTZ 40-10-12.5 MG Tabs Commonly known as:  TRIBENZOR Take 1 tablet by mouth every morning.   polyethylene glycol packet Commonly known as:  MIRALAX / GLYCOLAX Take 17 g by mouth at bedtime as needed for mild constipation. Reported on 03/14/2016   PRESERVISION AREDS 2 PO Take 1 tablet by mouth 2 (two) times daily.   rotigotine 2 MG/24HR Commonly known as:  NEUPRO APPLY 1 PATCH TO SKIN DAILY      Follow-up Information    Tana Conch, MD Follow up.   Specialty:  Family Medicine Contact information: [address] [place] [address] [phone]        Schedule an appointment as soon as possible for a visit with THOMPSON, DAVID A., MD.   Specialty:  Orthopedic Surgery Why:  for approx 2 weeks from surgery.  If doing well, you may cancel. Contact information: [address] [place] [address] [phone]          Allergies  Allergen Reactions  . Codeine Other (See Comments)    REACTION: Syncope     Consultations:  Janee Morn of hand surgery   Procedures (Echo, Carotid, EGD, Colonoscopy, ERCP)   Radiological studies: Mr Hand Right W Wo Contrast  Result Date: 12/21/2016 CLINICAL DATA:  83 year old female who presents to the emergency department 2 days after a cat bite to the dorsum of her right hand. She presents with extending erythema up to her proximal right forearm. EXAM: MRI OF THE RIGHT HAND WITHOUT AND WITH CONTRAST TECHNIQUE: Multiplanar, multisequence MR imaging of the right hand was performed before and after the administration of intravenous contrast. CONTRAST:  15mL MULTIHANCE GADOBENATE DIMEGLUMINE 529 MG/ML IV SOLN COMPARISON:  None. FINDINGS: Bones/Joint/Cartilage No focal marrow signal abnormality. No fracture or dislocation. Severe osteoarthritis of the first carpometacarpal joint. Mild osteoarthritis of the first CMC joint. Mild ulnar minus variance.  Small distal radioulnar joint effusion. No periosteal reaction or bone destruction. Ligaments Collateral ligaments are intact. Muscles and Tendons Muscles are normal. Small amount of fluid and synovial enhancement of the extensor digitorum compartment at the level of the wrist. Remainder of the extensor compartment tendons are intact. Intact flexor compartment tendons. Soft tissues Normal carpal tunnel. Severe soft tissue edema along the dorsal aspect of the extending into the distal forearm with enhancement on postcontrast imaging. 0.7 x 2.2 x 2.2 cm complex fluid collection in the dorsal subcutaneous fat along the ulnar aspect of the hand at the level of the [facility] joints most concerning for an abscess. IMPRESSION: 1. Severe cellulitis of the dorsal aspect of the hand and extends into the dorsal forearm. 0.7 x 2.2 x 2.2 cm abscess in the dorsal subcutaneous fat along the ulnar aspect of the hand at the level of the CMC joints. 2. Severe osteoarthritis of the first [facility] joint. Electronically Signed   By: Elige Ko   On: 12/21/2016 15:41     Subjective:  Discharge Exam: Vitals:   12/23/16 0846 12/23/16 1500 12/23/16 2139 12/24/16 0401  BP: (!) 160/61 140/61 138/62 (!) 154/62  Pulse:  73 70 68  Resp:  18 18 18   Temp:  98.4 F (36.9 C) 98.1 F (36.7 C) 97.8 F (36.6 C)  TempSrc:  Oral Oral Oral  SpO2:  97%  98% 98%   General: Pt is alert, awake, not in acute distress Cardiovascular: RRR, S1/S2 +, no rubs, no gallops Respiratory: CTA bilaterally, no wheezing, no rhonchi Abdominal: Soft, NT, ND, bowel sounds + Extremities: no edema, no cyanosis   The results of significant diagnostics from this hospitalization (including imaging, microbiology, ancillary and laboratory) are listed below for reference.    Microbiology: Recent Results (from the past 240 hour(s))  Blood culture (routine x 2)     Status: None (Preliminary result)   Collection Time: 12/21/16 11:09 AM  Result Value Ref Range  Status   Specimen Description BLOOD LEFT ANTECUBITAL  Final   Special Requests BOTTLES DRAWN AEROBIC AND ANAEROBIC 5CC  Final   Culture PENDING  Incomplete   Report Status PENDING  Incomplete  Blood culture (routine x 2)     Status: None (Preliminary result)   Collection Time: 12/21/16 11:33 AM  Result Value Ref Range Status   Specimen Description BLOOD RIGHT ANTECUBITAL  Final   Special Requests BOTTLES DRAWN AEROBIC AND ANAEROBIC 10CC  Final   Culture NO GROWTH 2 DAYS  Final   Report Status PENDING  Incomplete  Aerobic/Anaerobic Culture (surgical/deep wound)     Status: None (Preliminary result)   Collection Time: 12/22/16  9:35 AM  Result Value Ref Range Status   Specimen Description ABSCESS HAND RIGHT  Final   Special Requests NONE  Final   Gram Stain   Final    MODERATE WBC PRESENT, PREDOMINANTLY PMN NO ORGANISMS SEEN    Culture NO GROWTH 1 DAY  Final   Report Status PENDING  Incomplete     Labs: BNP (last 3 results) No results for input(s): BNP in the last 8760 hours. Basic Metabolic Panel:  Recent Labs Lab 12/21/16 0954 12/21/16 1813 12/22/16 0546 12/23/16 0447  NA 136  --  141 142  K 3.0*  --  3.9 4.0  CL 101  --  109 112*  CO2 23  --  23 22  GLUCOSE 231*  --  97 116*  BUN 15  --  13 10  CREATININE 0.93  --  0.72 0.70  CALCIUM 8.9  --  8.7* 8.5*  MG  --  2.3  --   --    Liver Function Tests:  Recent Labs Lab 12/21/16 0954 12/22/16 0546 12/23/16 0447  AST 24 20 19   ALT 12* 14 13*  ALKPHOS 100 91 80  BILITOT 0.9 0.7 0.3  PROT 7.7 6.6 6.3*  ALBUMIN 3.5 3.2* 3.1*   No results for input(s): LIPASE, AMYLASE in the last 168 hours. No results for input(s): AMMONIA in the last 168 hours. CBC:  Recent Labs Lab 12/21/16 0954 12/22/16 0546 12/23/16 0447  WBC 11.2* 11.4* 12.5*  NEUTROABS 8.8*  --   --   HGB 11.4* 10.8* 9.2*  HCT 35.7* 33.6* 28.9*  MCV 89.0 88.0 87.6  PLT 264 252 224   Cardiac Enzymes:  Recent Labs Lab 12/21/16 1225  CKTOTAL  54   BNP: Invalid input(s): POCBNP CBG: No results for input(s): GLUCAP in the last 168 hours. D-Dimer No results for input(s): DDIMER in the last 72 hours. Hgb A1c  Recent Labs  12/21/16 1225  HGBA1C 5.7*   Lipid Profile No results for input(s): CHOL, HDL, LDLCALC, TRIG, CHOLHDL, LDLDIRECT in the last 72 hours. Thyroid function studies No results for input(s): TSH, T4TOTAL, T3FREE, THYROIDAB in the last 72 hours.  Invalid input(s): FREET3 Anemia work up No results for input(s): VITAMINB12, FOLATE,  FERRITIN, TIBC, IRON, RETICCTPCT in the last 72 hours. Urinalysis    Component Value Date/Time   COLORURINE AMBER (A) 03/08/2015 0926   APPEARANCEUR CLEAR 03/08/2015 0926   LABSPEC 1.015 03/06/2016 1338   PHURINE 6.0 03/06/2016 1338   GLUCOSEU NEGATIVE 03/06/2016 1338   HGBUR TRACE (A) 03/06/2016 1338   BILIRUBINUR NEGATIVE 03/06/2016 1338   BILIRUBINUR + 09/24/2011 1334   KETONESUR NEGATIVE 03/06/2016 1338   PROTEINUR NEGATIVE 03/06/2016 1338   UROBILINOGEN 0.2 03/06/2016 1338   NITRITE NEGATIVE 03/06/2016 1338   LEUKOCYTESUR SMALL (A) 03/06/2016 1338   Sepsis Labs Invalid input(s): PROCALCITONIN,  WBC,  LACTICIDVEN Microbiology Recent Results (from the past 240 hour(s))  Blood culture (routine x 2)     Status: None (Preliminary result)   Collection Time: 12/21/16 11:09 AM  Result Value Ref Range Status   Specimen Description BLOOD LEFT ANTECUBITAL  Final   Special Requests BOTTLES DRAWN AEROBIC AND ANAEROBIC 5CC  Final   Culture PENDING  Incomplete   Report Status PENDING  Incomplete  Blood culture (routine x 2)     Status: None (Preliminary result)   Collection Time: 12/21/16 11:33 AM  Result Value Ref Range Status   Specimen Description BLOOD RIGHT ANTECUBITAL  Final   Special Requests BOTTLES DRAWN AEROBIC AND ANAEROBIC 10CC  Final   Culture NO GROWTH 2 DAYS  Final   Report Status PENDING  Incomplete  Aerobic/Anaerobic Culture (surgical/deep wound)     Status:  None (Preliminary result)   Collection Time: 12/22/16  9:35 AM  Result Value Ref Range Status   Specimen Description ABSCESS HAND RIGHT  Final   Special Requests NONE  Final   Gram Stain   Final    MODERATE WBC PRESENT, PREDOMINANTLY PMN NO ORGANISMS SEEN    Culture NO GROWTH 1 DAY  Final   Report Status PENDING  Incomplete     Time coordinating discharge: Over 30 minutes  SIGNED:   Clint LippsELMAHI,Lamica Mccart A, MD  [facility] 12/24/2016, 10:08 AM Pager   If 7PM-7AM, please contact night-coverage [email or web address] Password [email or web address]

## 2016-12-24 NOTE — Progress Notes (Signed)
Pt ready for discharge to home. All personal belongings with pt. No distress or c/o from pt. Discharge instructions and rx reviewed with pt. Pt escorted to front entrance for ride pickup

## 2016-12-24 NOTE — Plan of Care (Signed)
Problem: Safety: Goal: Ability to remain free from injury will improve Outcome: Progressing Safety precautions and fall preventions maintained, no fall or injury this shift  Problem: Pain Managment: Goal: General experience of comfort will improve Outcome: Progressing Denies pain  Problem: Tissue Perfusion: Goal: Risk factors for ineffective tissue perfusion will decrease Outcome: Progressing No s/s of dvt  Problem: Bowel/Gastric: Goal: Will not experience complications related to bowel motility Outcome: Progressing Denies gastric and bowel issues  Problem: Physical Regulation: Goal: Ability to avoid or minimize complications of infection will improve Outcome: Progressing VS WVL  Problem: Skin Integrity: Goal: Skin integrity will improve Outcome: Progressing Dressing changed to rt hand incision, old dressing with moderate amount of serosanguinous drainage, hand washed with soap and water as ordered and covered with dry sterile 4x4 and kerlix

## 2016-12-26 ENCOUNTER — Telehealth: Payer: Self-pay

## 2016-12-26 LAB — CULTURE, BLOOD (ROUTINE X 2)
Culture: NO GROWTH
Culture: NO GROWTH

## 2016-12-26 NOTE — Telephone Encounter (Signed)
D/C 12/24/16 To: home  Spoke with pt and she states that she is still weak but improving. She is maintaining her wound and cleaning as directed. She denies any new symptoms. She is now ambulating with a walker as she did have a fall in the hospital.   Appt scheduled with Dr Durene CalHunter 12/31/16. Pt    Transition Care Management Follow-up Telephone Call How have you been since you were released from the hospital? Improving slowly   Do you understand why you were in the hospital? yes   Do you understand the discharge instrcutions? yes  Items Reviewed:  Medications reviewed: yes  Allergies reviewed: yes  Dietary changes reviewed: yes  Referrals reviewed: yes   Functional Questionnaire:   Activities of Daily Living (ADLs):   She states they are independent in the following: ambulation, bathing and hygiene, feeding, continence, grooming, toileting and dressing States they require assistance with the following: none   Any transportation issues/concerns?: no   Any patient concerns? no   Confirmed importance and date/time of follow-up visits scheduled: yes   Confirmed with patient if condition begins to worsen call PCP or go to the ER.  Patient was given the Call-a-Nurse line [phone]: yes

## 2016-12-27 LAB — AEROBIC/ANAEROBIC CULTURE W GRAM STAIN (SURGICAL/DEEP WOUND)

## 2016-12-27 LAB — AEROBIC/ANAEROBIC CULTURE (SURGICAL/DEEP WOUND): Culture: NO GROWTH

## 2016-12-31 ENCOUNTER — Ambulatory Visit (INDEPENDENT_AMBULATORY_CARE_PROVIDER_SITE_OTHER): Payer: Medicare Other | Admitting: Family Medicine

## 2016-12-31 ENCOUNTER — Encounter: Payer: Self-pay | Admitting: Family Medicine

## 2016-12-31 VITALS — BP 124/68 | HR 75 | Temp 98.0°F | Ht 66.0 in | Wt 167.4 lb

## 2016-12-31 DIAGNOSIS — I10 Essential (primary) hypertension: Secondary | ICD-10-CM | POA: Diagnosis not present

## 2016-12-31 DIAGNOSIS — I701 Atherosclerosis of renal artery: Secondary | ICD-10-CM

## 2016-12-31 DIAGNOSIS — I70219 Atherosclerosis of native arteries of extremities with intermittent claudication, unspecified extremity: Secondary | ICD-10-CM

## 2016-12-31 DIAGNOSIS — J438 Other emphysema: Secondary | ICD-10-CM | POA: Diagnosis not present

## 2016-12-31 DIAGNOSIS — L03119 Cellulitis of unspecified part of limb: Secondary | ICD-10-CM | POA: Diagnosis not present

## 2016-12-31 DIAGNOSIS — L02519 Cutaneous abscess of unspecified hand: Secondary | ICD-10-CM

## 2016-12-31 DIAGNOSIS — E785 Hyperlipidemia, unspecified: Secondary | ICD-10-CM | POA: Diagnosis not present

## 2016-12-31 NOTE — Patient Instructions (Addendum)
Labs before you leave  Finish antibiotics but overall this looks great- much improved!   dont let that cat get you  Also wanted to mention they did another a1c which looked much better than one I had done before which is encouraging

## 2016-12-31 NOTE — Progress Notes (Signed)
Subjective:  Cheryl Atkinson is a 83 y.o. year old very pleasant female patient who presents for transitional care management and hospital follow up for Cellulitis and abscess after pet bite. Patient was hospitalized from 12/21/16 to 12/24/16. A TCM phone call was completed on 12/26/16. Medical complexity moderate   Patient with history of HTN, PAD, memory loss, renal artery stenosis. She was bitten by household cat 2 days prior to admission on her right hand while playing with the cat. She had progressive swelling and redness as well as development of pain. She did not want to go to the hospital but when redness got up to near her elbow she agreed to go in with a family member. She did not have fever or chills prior to hospitalization. She was up to date on tetanus shot due to 2014 administration. Due to severity of pain she had an MRI which showed cellulitis and abscess which required irrigation and debridement by hand surgery. Dr. Janee Mornhompson managed surgically with I&D on 12/22/16. Dressing changed in hospital and outpatient follow up. I reviewed the MRI independently and abscess about 1 x 1 x 2 cm noted on ulnar portion of the hand [facility]. No follow up imaging performed. Wound was left open to allow for drainage.   For the cellulitis and due to cat bite, She was placed on augmentin for 7 days with bacitracin topically which was helpful. Initial lactic acid was up to 4 but repeat was down to 1.2. She had hypokalemia which resolved before discharge.   Her chronic conditions were managed in the hospital without incident. Blood pressure was controlled on tribenzor. Known COPD but was asymptomatic during hospital stay on no medication. Her PAD is lower extremity and did not affect surgery- she was continued on low dose aspirin, plavix, atorvastatin 40mg . Dr. Allyson SabalBerry also follows her renal artery stenosis. renal function was 0.93 up slightly from baseline 0.69-0.81. Her hyperlipidemia was maintained on  statin.  Today, she states she has one day left to finish antibiotic course. Hand is draining minimal amounts. Pain is really minimal. She is very pleased with her pogress. Did have a fall in hospital and has transitioned to walker full time  ROS- no fever, chills, nausea, vomiting. No expanding redness on hand   Past Medical History-  Patient Active Problem List   Diagnosis Date Noted  . Memory loss 04/11/2016    Priority: High  . Renal artery stenosis (HCC) 09/27/2015    Priority: High  . Claudication (HCC) 05/04/2015    Priority: High  . Atherosclerotic PVD with intermittent claudication (HCC) 04/26/2015    Priority: High  . DNR (do not resuscitate) 09/29/2014    Priority: High  . BPPV (benign paroxysmal positional vertigo) 07/12/2016    Priority: Medium  . Hyperlipidemia 06/30/2015    Priority: Medium  . Former smoker 09/29/2014    Priority: Medium  . Hyperglycemia 07/19/2014    Priority: Medium  . COPD (chronic obstructive pulmonary disease) (HCC) 09/20/2009    Priority: Medium  . Iron deficiency anemia 11/09/2007    Priority: Medium  . RESTLESS LEG SYNDROME 09/25/2007    Priority: Medium  . Essential hypertension 09/25/2007    Priority: Medium  . GERD 09/25/2007    Priority: Medium  . LOW BACK PAIN 09/25/2007    Priority: Medium  . Osteoporosis 09/25/2007    Priority: Medium  . Mallet toe of right foot 10/20/2015    Priority: Low  . Tinnitus 12/31/2014    Priority:  Low  . Multinodular goiter 08/30/2013    Priority: Low  . Spinal stenosis of lumbar region at multiple levels 09/29/2012    Priority: Low  . HIP PAIN, BILATERAL 07/16/2010    Priority: Low  . CONSTIPATION, CHRONIC 09/20/2009    Priority: Low  . History of UTI 11/09/2007    Priority: Low  . Cellulitis of right hand 12/21/2016  . Elevated lactic acid level 12/21/2016  . Acute hypokalemia 12/21/2016  . Cat bite of right hand 12/21/2016    Medications- reviewed and updated Current Outpatient  Prescriptions  Medication Sig Dispense Refill  . amoxicillin-clavulanate (AUGMENTIN) 875-125 MG tablet Take 1 tablet by mouth 2 (two) times daily. 14 tablet 0  . aspirin EC 81 MG EC tablet Take 1 tablet (81 mg total) by mouth daily.    Marland Kitchen atorvastatin (LIPITOR) 40 MG tablet TAKE 1 TABLET DAILY AT 6 P.M. 90 tablet 2  . bacitracin ointment Apply topically 2 (two) times daily. 120 g 0  . clopidogrel (PLAVIX) 75 MG tablet TAKE 1 TABLET DAILY WITH BREAKFAST 90 tablet 2  . CONZIP 300 MG CP24 Take 300 mg by mouth every morning.    . Multiple Vitamins-Minerals (PRESERVISION AREDS 2 PO) Take 1 tablet by mouth 2 (two) times daily.    . Olmesartan-Amlodipine-HCTZ (TRIBENZOR) 40-10-12.5 MG TABS Take 1 tablet by mouth every morning. 90 tablet 3  . polyethylene glycol (MIRALAX / GLYCOLAX) packet Take 17 g by mouth at bedtime as needed for mild constipation. Reported on 03/14/2016    . rotigotine (NEUPRO) 2 MG/24HR APPLY 1 PATCH TO SKIN DAILY 90 patch 1  . traMADol (ULTRAM) 50 MG tablet Take 50 mg by mouth at bedtime as needed for moderate pain.     No current facility-administered medications for this visit.     Objective: BP 124/68 (BP Location: Left Arm, Patient Position: Sitting, Cuff Size: Normal)   Pulse 75   Temp 98 F (36.7 C) (Oral)   Ht 5\' 6"  (1.676 m)   Wt 167 lb 6.4 oz (75.9 kg)   SpO2 96%   BMI 27.02 kg/m  Gen: NAD, resting comfortably CV: RRR no murmurs rubs or gallops Lungs: CTAB no crackles, wheeze, rhonchi Abdomen: soft/nontender/nondistended/normal bowel sounds. No rebound or guarding.  Ext: no edema Msk: on right hand there is a wound that remains open less than 2 x 1 cm- no purulent drainage. Surrounding skin only mildly tender and mildly red Neuro: now walks with wlaker  Assessment/Plan:  Cellulitis and abscess of hand -appears to be healing well, will be left open to drain. Has follow up early February with hand surgery.  Plan: CBC with Differential/Platelet, Basic metabolic  panel  Hypertension- continue tribenzor as controlled  COPD- stable without inhalers (has declined in past)  PAD with claudication and renal artery stenosis and HLD- compliant with aspirin, atorvastatin, and plavix. Stable cluadication   Orders Placed This Encounter  Procedures  . CBC with Differential/Platelet  . Basic metabolic panel    [place]   Return precautions advised. Primarily prn given drastic improvement.  Tana Conch, MD

## 2016-12-31 NOTE — Progress Notes (Signed)
Pre visit review using our clinic review tool, if applicable. No additional management support is needed unless otherwise documented below in the visit note. 

## 2017-01-01 LAB — BASIC METABOLIC PANEL
BUN: 23 mg/dL (ref 6–23)
CO2: 28 mEq/L (ref 19–32)
Calcium: 9.2 mg/dL (ref 8.4–10.5)
Chloride: 103 mEq/L (ref 96–112)
Creatinine, Ser: 0.84 mg/dL (ref 0.40–1.20)
GFR: 68.76 mL/min (ref >60.00)
Glucose, Bld: 105 mg/dL — ABNORMAL HIGH (ref 70–99)
Potassium: 3.6 mEq/L (ref 3.5–5.1)
Sodium: 139 mEq/L (ref 135–145)

## 2017-01-01 LAB — CBC WITH DIFFERENTIAL/PLATELET
Basophils Absolute: 0.1 10*3/uL (ref 0.0–0.1)
Basophils Relative: 1.2 % (ref 0.0–3.0)
Eosinophils Absolute: 0.3 10*3/uL (ref 0.0–0.7)
Eosinophils Relative: 3.2 % (ref 0.0–5.0)
HCT: 33 % — ABNORMAL LOW (ref 36.0–46.0)
Hemoglobin: 10.9 g/dL — ABNORMAL LOW (ref 12.0–15.0)
Lymphocytes Relative: 26.4 % (ref 12.0–46.0)
Lymphs Abs: 2.4 10*3/uL (ref 0.7–4.0)
MCHC: 33 g/dL (ref 30.0–36.0)
MCV: 87.1 fl (ref 78.0–100.0)
Monocytes Absolute: 0.9 10*3/uL (ref 0.1–1.0)
Monocytes Relative: 10.2 % (ref 3.0–12.0)
Neutro Abs: 5.4 10*3/uL (ref 1.4–7.7)
Neutrophils Relative %: 59 % (ref 43.0–77.0)
Platelets: 322 10*3/uL (ref 150.0–400.0)
RBC: 3.79 Mil/uL — ABNORMAL LOW (ref 3.87–5.11)
RDW: 13.5 % (ref 11.5–15.5)
WBC: 9.1 10*3/uL (ref 4.0–10.5)

## 2017-01-06 DIAGNOSIS — M47816 Spondylosis without myelopathy or radiculopathy, lumbar region: Secondary | ICD-10-CM | POA: Diagnosis not present

## 2017-01-09 DIAGNOSIS — Z79891 Long term (current) use of opiate analgesic: Secondary | ICD-10-CM | POA: Diagnosis not present

## 2017-01-09 DIAGNOSIS — M545 Low back pain: Secondary | ICD-10-CM | POA: Diagnosis not present

## 2017-01-09 DIAGNOSIS — M5136 Other intervertebral disc degeneration, lumbar region: Secondary | ICD-10-CM | POA: Diagnosis not present

## 2017-01-09 DIAGNOSIS — Z79899 Other long term (current) drug therapy: Secondary | ICD-10-CM | POA: Diagnosis not present

## 2017-01-09 DIAGNOSIS — G894 Chronic pain syndrome: Secondary | ICD-10-CM | POA: Diagnosis not present

## 2017-01-09 DIAGNOSIS — M47817 Spondylosis without myelopathy or radiculopathy, lumbosacral region: Secondary | ICD-10-CM | POA: Diagnosis not present

## 2017-01-14 DIAGNOSIS — H3530 Unspecified macular degeneration: Secondary | ICD-10-CM | POA: Diagnosis not present

## 2017-01-14 DIAGNOSIS — H52223 Regular astigmatism, bilateral: Secondary | ICD-10-CM | POA: Diagnosis not present

## 2017-01-14 DIAGNOSIS — H524 Presbyopia: Secondary | ICD-10-CM | POA: Diagnosis not present

## 2017-01-15 ENCOUNTER — Ambulatory Visit (HOSPITAL_COMMUNITY)
Admit: 2017-01-15 | Discharge: 2017-01-15 | Disposition: A | Discharge status: Home / Self Care | Payer: Medicare Other | Attending: Cardiology | Admitting: Cardiology

## 2017-01-15 ENCOUNTER — Ambulatory Visit (HOSPITAL_COMMUNITY)
Admit: 2017-01-15 | Discharge: 2017-01-15 | Disposition: A | Discharge status: Home / Self Care | Payer: Medicare Other | Source: Ambulatory Visit | Attending: Cardiology | Admitting: Cardiology

## 2017-01-15 DIAGNOSIS — I70201 Unspecified atherosclerosis of native arteries of extremities, right leg: Secondary | ICD-10-CM | POA: Diagnosis not present

## 2017-01-15 DIAGNOSIS — Z95828 Presence of other vascular implants and grafts: Secondary | ICD-10-CM | POA: Insufficient documentation

## 2017-01-15 DIAGNOSIS — I10 Essential (primary) hypertension: Secondary | ICD-10-CM | POA: Insufficient documentation

## 2017-01-15 DIAGNOSIS — I739 Peripheral vascular disease, unspecified: Secondary | ICD-10-CM

## 2017-01-15 DIAGNOSIS — J449 Chronic obstructive pulmonary disease, unspecified: Secondary | ICD-10-CM | POA: Insufficient documentation

## 2017-01-15 DIAGNOSIS — Z87891 Personal history of nicotine dependence: Secondary | ICD-10-CM | POA: Diagnosis not present

## 2017-01-15 DIAGNOSIS — I77819 Aortic ectasia, unspecified site: Secondary | ICD-10-CM | POA: Diagnosis not present

## 2017-01-15 DIAGNOSIS — I70203 Unspecified atherosclerosis of native arteries of extremities, bilateral legs: Secondary | ICD-10-CM | POA: Insufficient documentation

## 2017-01-15 DIAGNOSIS — E785 Hyperlipidemia, unspecified: Secondary | ICD-10-CM | POA: Insufficient documentation

## 2017-01-17 ENCOUNTER — Other Ambulatory Visit: Payer: Self-pay | Admitting: Cardiovascular Disease

## 2017-01-17 DIAGNOSIS — I70219 Atherosclerosis of native arteries of extremities with intermittent claudication, unspecified extremity: Secondary | ICD-10-CM

## 2017-01-17 DIAGNOSIS — I739 Peripheral vascular disease, unspecified: Secondary | ICD-10-CM

## 2017-02-03 DIAGNOSIS — M47816 Spondylosis without myelopathy or radiculopathy, lumbar region: Secondary | ICD-10-CM | POA: Diagnosis not present

## 2017-02-12 ENCOUNTER — Encounter: Payer: Self-pay | Admitting: Family Medicine

## 2017-02-12 ENCOUNTER — Ambulatory Visit (INDEPENDENT_AMBULATORY_CARE_PROVIDER_SITE_OTHER): Payer: Medicare Other | Admitting: Family Medicine

## 2017-02-12 DIAGNOSIS — I701 Atherosclerosis of renal artery: Secondary | ICD-10-CM | POA: Diagnosis not present

## 2017-02-12 DIAGNOSIS — I10 Essential (primary) hypertension: Secondary | ICD-10-CM

## 2017-02-12 DIAGNOSIS — E785 Hyperlipidemia, unspecified: Secondary | ICD-10-CM | POA: Diagnosis not present

## 2017-02-12 DIAGNOSIS — R739 Hyperglycemia, unspecified: Secondary | ICD-10-CM | POA: Diagnosis not present

## 2017-02-12 DIAGNOSIS — J438 Other emphysema: Secondary | ICD-10-CM | POA: Diagnosis not present

## 2017-02-12 NOTE — Assessment & Plan Note (Signed)
S: controlled on tribenzor in the past and on repeat.  BP Readings from Last 3 Encounters:  02/12/17 134/78  12/31/16 124/68  12/24/16 (!) 154/62  A/P:Continue current medications

## 2017-02-12 NOTE — Progress Notes (Signed)
Pre visit review using our clinic review tool, if applicable. No additional management support is needed unless otherwise documented below in the visit note. 

## 2017-02-12 NOTE — Assessment & Plan Note (Signed)
S: prior years ago noted at 6.4- much improved on last check. She states has been really loose on her sugar intake Lab Results  Component Value Date   HGBA1C 5.7 (H) 12/21/2016  A/P: we discussed healthy eating/reducing sugars. Holding off on increasing statin due to increasing DM risk

## 2017-02-12 NOTE — Assessment & Plan Note (Signed)
S: known COPD but no treatment. Does not use any inhaers.  A/P: continue to monitor- doing well

## 2017-02-12 NOTE — Assessment & Plan Note (Signed)
S: mild poorly controlled on atorvastatin 40mg  given PAD and LDL goal under 70. No myalgias.  Lab Results  Component Value Date   CHOL 175 11/14/2016   HDL 84.60 11/14/2016   LDLCALC 76 11/14/2016   LDLDIRECT 70.0 03/14/2016   TRIG 76.0 11/14/2016   CHOLHDL 2 11/14/2016   A/P: We discussed would be hesitant to increase statin given DM risk- instead we will focus on cutting down the extra sugars and fatty foods. Repeat LDL at follow up- may change to crestor 40mg  in long run if not at goal

## 2017-02-12 NOTE — Patient Instructions (Signed)
No changes today  Lets see each other in 3 months. See if they can schedule an annual wellness visit for you with Darl PikesSusan on the same day before you see me.

## 2017-02-12 NOTE — Progress Notes (Signed)
Subjective:  Cheryl Atkinson is a 83 y.o. year old very pleasant female patient who presents for/with See problem oriented charting ROS- no chest pain. Does feel slightly off balance- has done PT for gait and balance before without improvement. Uses her walker. No edema.  No worsening SOB  Past Medical History-  Patient Active Problem List   Diagnosis Date Noted  . Memory loss 04/11/2016    Priority: High  . Renal artery stenosis (HCC) 09/27/2015    Priority: High  . Claudication (HCC) 05/04/2015    Priority: High  . Atherosclerotic PVD with intermittent claudication (HCC) 04/26/2015    Priority: High  . DNR (do not resuscitate) 09/29/2014    Priority: High  . LOW BACK PAIN 09/25/2007    Priority: High  . BPPV (benign paroxysmal positional vertigo) 07/12/2016    Priority: Medium  . Hyperlipidemia 06/30/2015    Priority: Medium  . Former smoker 09/29/2014    Priority: Medium  . Hyperglycemia 07/19/2014    Priority: Medium  . COPD (chronic obstructive pulmonary disease) (HCC) 09/20/2009    Priority: Medium  . Iron deficiency anemia 11/09/2007    Priority: Medium  . RESTLESS LEG SYNDROME 09/25/2007    Priority: Medium  . Essential hypertension 09/25/2007    Priority: Medium  . GERD 09/25/2007    Priority: Medium  . Osteoporosis 09/25/2007    Priority: Medium  . Cat bite of right hand 12/21/2016    Priority: Low  . Mallet toe of right foot 10/20/2015    Priority: Low  . Tinnitus 12/31/2014    Priority: Low  . Multinodular goiter 08/30/2013    Priority: Low  . Spinal stenosis of lumbar region at multiple levels 09/29/2012    Priority: Low  . HIP PAIN, BILATERAL 07/16/2010    Priority: Low  . CONSTIPATION, CHRONIC 09/20/2009    Priority: Low  . History of UTI 11/09/2007    Priority: Low    Medications- reviewed and updated Current Outpatient Prescriptions  Medication Sig Dispense Refill  . aspirin EC 81 MG EC tablet Take 1 tablet (81 mg total) by mouth daily.     Marland Kitchen atorvastatin (LIPITOR) 40 MG tablet TAKE 1 TABLET DAILY AT 6 P.M. 90 tablet 2  . clopidogrel (PLAVIX) 75 MG tablet TAKE 1 TABLET DAILY WITH BREAKFAST 90 tablet 2  . CONZIP 300 MG CP24 Take 300 mg by mouth every morning.    . Multiple Vitamins-Minerals (PRESERVISION AREDS 2 PO) Take 1 tablet by mouth 2 (two) times daily.    . Olmesartan-Amlodipine-HCTZ (TRIBENZOR) 40-10-12.5 MG TABS Take 1 tablet by mouth every morning. 90 tablet 3  . polyethylene glycol (MIRALAX / GLYCOLAX) packet Take 17 g by mouth at bedtime as needed for mild constipation. Reported on 03/14/2016    . rotigotine (NEUPRO) 2 MG/24HR APPLY 1 PATCH TO SKIN DAILY 90 patch 1  . traMADol (ULTRAM) 50 MG tablet Take 50 mg by mouth at bedtime as needed for moderate pain.     No current facility-administered medications for this visit.     Objective: BP 134/78   Pulse 71   Temp 98 F (36.7 C) (Oral)   Ht 5\' 6"  (1.676 m)   Wt 167 lb (75.8 kg)   SpO2 96%   BMI 26.95 kg/m  Gen: NAD, resting comfortably CV: RRR no murmurs rubs or gallops Lungs: CTAB no crackles, wheeze, rhonchi Ext: no edema Skin: warm, dry Neuro: walks with 4 wheel rolling walker  Assessment/Plan:  Essential hypertension S: controlled  on tribenzor in the past and on repeat.  BP Readings from Last 3 Encounters:  02/12/17 134/78  12/31/16 124/68  12/24/16 (!) 154/62  A/P:Continue current medications  COPD (chronic obstructive pulmonary disease) (HCC) S: known COPD but no treatment. Does not use any inhaers.  A/P: continue to monitor- doing well  Hyperglycemia S: prior years ago noted at 6.4- much improved on last check. She states has been really loose on her sugar intake Lab Results  Component Value Date   HGBA1C 5.7 (H) 12/21/2016  A/P: we discussed healthy eating/reducing sugars. Holding off on increasing statin due to increasing DM risk  Hyperlipidemia S: mild poorly controlled on atorvastatin 40mg  given PAD and LDL goal under 70. No  myalgias.  Lab Results  Component Value Date   CHOL 175 11/14/2016   HDL 84.60 11/14/2016   LDLCALC 76 11/14/2016   LDLDIRECT 70.0 03/14/2016   TRIG 76.0 11/14/2016   CHOLHDL 2 11/14/2016   A/P: We discussed would be hesitant to increase statin given DM risk- instead we will focus on cutting down the extra sugars and fatty foods. Repeat LDL at follow up- may change to crestor 40mg  in long run if not at goal   3 month follow up. AWV around follow up. May update LDL and a1c at that time.    Return precautions advised.  Tana ConchStephen Shenoa Hattabaugh, MD

## 2017-04-09 DIAGNOSIS — M5136 Other intervertebral disc degeneration, lumbar region: Secondary | ICD-10-CM | POA: Diagnosis not present

## 2017-04-09 DIAGNOSIS — M545 Low back pain: Secondary | ICD-10-CM | POA: Diagnosis not present

## 2017-04-09 DIAGNOSIS — M791 Myalgia: Secondary | ICD-10-CM | POA: Diagnosis not present

## 2017-04-09 DIAGNOSIS — M47817 Spondylosis without myelopathy or radiculopathy, lumbosacral region: Secondary | ICD-10-CM | POA: Diagnosis not present

## 2017-04-09 DIAGNOSIS — Z6827 Body mass index (BMI) 27.0-27.9, adult: Secondary | ICD-10-CM | POA: Diagnosis not present

## 2017-04-30 ENCOUNTER — Ambulatory Visit (INDEPENDENT_AMBULATORY_CARE_PROVIDER_SITE_OTHER): Payer: Medicare Other | Admitting: Cardiovascular Disease

## 2017-04-30 ENCOUNTER — Encounter: Payer: Self-pay | Admitting: Cardiovascular Disease

## 2017-04-30 VITALS — BP 116/58 | HR 73 | Ht 66.0 in | Wt 164.2 lb

## 2017-04-30 DIAGNOSIS — I10 Essential (primary) hypertension: Secondary | ICD-10-CM | POA: Diagnosis not present

## 2017-04-30 DIAGNOSIS — I701 Atherosclerosis of renal artery: Secondary | ICD-10-CM | POA: Diagnosis not present

## 2017-04-30 DIAGNOSIS — E7800 Pure hypercholesterolemia, unspecified: Secondary | ICD-10-CM

## 2017-04-30 MED ORDER — ATORVASTATIN CALCIUM 40 MG PO TABS: 40.0000 mg | ORAL_TABLET | Freq: Every day | ORAL | 3 refills | Status: DC

## 2017-04-30 MED ORDER — OLMESARTAN-AMLODIPINE-HCTZ 40-10-12.5 MG PO TABS: 1.0000 | ORAL_TABLET | ORAL | 3 refills | Status: DC

## 2017-04-30 MED ORDER — CLOPIDOGREL BISULFATE 75 MG PO TABS: 75.0000 mg | ORAL_TABLET | Freq: Every day | ORAL | 3 refills | Status: DC

## 2017-04-30 NOTE — Assessment & Plan Note (Signed)
History of essential hypertension blood pressure measured today at 116/58. She is on amlodipine, hydrochlorothiazide and olmesartan. Continue current meds at current dosing

## 2017-04-30 NOTE — Patient Instructions (Signed)
Medication Instructions: Your physician recommends that you continue on your current medications as directed. Please refer to the Current Medication list given to you today.   Testing/Procedures: Schedule LEA/ABI dopplers for February 2019.  Follow-Up: Your physician wants you to follow-up in: 1 year with Dr. Allyson SabalBerry. You will receive a reminder letter in the mail two months in advance. If you don't receive a letter, please call our office to schedule the follow-up appointment.  If you need a refill on your cardiac medications before your next appointment, please call your pharmacy.

## 2017-04-30 NOTE — Progress Notes (Signed)
04/30/2017 Crissie ReeseBetty L Hagner   [birth year]  [id number]  Primary Physician Durene CalHunter, Aldine ContesStephen O, MD Primary Cardiologist: Runell GessJonathan J Zetha Kuhar MD Roseanne RenoFACP, FACC, FAHA, FSCAI  HPI:  Ms. Valentino Noseeeples is an 83 year old female requiring podiatry procedure on her right foot. She was sent to me by Dr. Ardelle AntonWagoner for claudication and peripheral vascular disease. I last saw her in the office 05/22/16. I stented her right common iliac artery back in June. She did have occluded SFAs bilaterally. She underwent staged right SFA directional atherectomy and drug-eluting balloon angioplasty on 09/04/15 with subsequent Dopplers that showed significant improvement in her right ABI. She no longer has claudication on that side. She does have a palpable pedal pulse. Her problems include discontinued tobacco abuse, hypertension and hyperlipidemia.since I saw her lastshe has had an episode of substernal chest pain and was evaluated in the emergency room. It was thought secondary to GERD. She has had Doppler studies of her lower extremities that show a decline in her right ABI with the development of a high-frequency signal in her mid right SFA although she says her back pain limits her more than her legs at this time. Since I saw her a year ago she's remained clinically stable. She denies chest pain does get some shortness of breath especially when singing in church. She does have COPD.   Current Outpatient Prescriptions  Medication Sig Dispense Refill  . aspirin EC 81 MG EC tablet Take 1 tablet (81 mg total) by mouth daily.    Marland Kitchen. atorvastatin (LIPITOR) 40 MG tablet TAKE 1 TABLET DAILY AT 6 P.M. 90 tablet 2  . clopidogrel (PLAVIX) 75 MG tablet TAKE 1 TABLET DAILY WITH BREAKFAST 90 tablet 2  . CONZIP 300 MG CP24 Take 300 mg by mouth every morning.    . Multiple Vitamins-Minerals (PRESERVISION AREDS 2 PO) Take 1 tablet by mouth 2 (two) times daily.    . Olmesartan-Amlodipine-HCTZ (TRIBENZOR) 40-10-12.5 MG TABS Take 1 tablet by mouth every  morning. 90 tablet 3  . polyethylene glycol (MIRALAX / GLYCOLAX) packet Take 17 g by mouth at bedtime as needed for mild constipation. Reported on 03/14/2016    . rotigotine (NEUPRO) 2 MG/24HR APPLY 1 PATCH TO SKIN DAILY 90 patch 1  . traMADol (ULTRAM) 50 MG tablet Take 50 mg by mouth at bedtime as needed for moderate pain.     No current facility-administered medications for this visit.     Allergies  Allergen Reactions  . Codeine Other (See Comments)    REACTION: Syncope     Social History   Social History  . Marital status: Widowed    Spouse name: N/A  . Number of children: N/A  . Years of education: N/A   Occupational History  . Not on file.   Social History Main Topics  . Smoking status: Former Smoker    Packs/day: 0.50    Years: 60.00    Types: Cigarettes    Quit date: 04/02/2015  . Smokeless tobacco: Never Used  . Alcohol use No  . Drug use: No  . Sexual activity: No   Other Topics Concern  . Not on file   Social History Narrative   Widowed 2013. 2 sons. 1 grandchild.    Son can help on weekends. Sister and sister in law could help as well. Thinks she may stop driving in next few years.       Retired from [employer].       Hobbies: time at home and with  family      HCPOA: sister-in-law and brother. Deirdre Evener.       DNR/DNI      Regular exercise: none   Caffeine use: cup of coffee      Review of Systems: General: negative for chills, fever, night sweats or weight changes.  Cardiovascular: negative for chest pain, dyspnea on exertion, edema, orthopnea, palpitations, paroxysmal nocturnal dyspnea or shortness of breath Dermatological: negative for rash Respiratory: negative for cough or wheezing Urologic: negative for hematuria Abdominal: negative for nausea, vomiting, diarrhea, bright red blood per rectum, melena, or hematemesis Neurologic: negative for visual changes, syncope, or dizziness All other systems reviewed and are otherwise negative  except as noted above.    Blood pressure (!) 116/58, pulse 73, height 5\' 6"  (1.676 m), weight 164 lb 3.2 oz (74.5 kg).  General appearance: alert and no distress Neck: no adenopathy, no carotid bruit, no JVD, supple, symmetrical, trachea midline and thyroid not enlarged, symmetric, no tenderness/mass/nodules Lungs: clear to auscultation bilaterally Heart: regular rate and rhythm, S1, S2 normal, no murmur, click, rub or gallop Extremities: extremities normal, atraumatic, no cyanosis or edema  EKG Sinus rhythm at 73 without ST or T-wave changes. I personally reviewed this EKG  ASSESSMENT AND PLAN:   Essential hypertension History of essential hypertension blood pressure measured today at 116/58. She is on amlodipine, hydrochlorothiazide and olmesartan. Continue current meds at current dosing  Hyperlipidemia History of hyperlipidemia on statin therapy with recent lipid profile performed 11/14/16 revealed a total cholesterol 175, LDL 76 and HDL of 84.  Atherosclerotic PVD with intermittent claudication (HCC) History of peripheral arterial disease status post right common iliac artery stenting June 2016 patient did have occluded SFAs bilaterally. She underwent staged right SFA directional atherectomy and drug-eluting balloon angioplasty 09/04/15 with subsequent improvement in her Dopplers and her right ABI. Her claudication did improve on that side. She had repeat Dopplers performed 01/15/17 revealed a patent filling a patent iliac stent mild-to-moderate disease in her right SFA. Her right ABI was 0.77  And left ABI was 0.63.      Runell Gess MD [facility], [facility] 04/30/2017 2:49 PM

## 2017-04-30 NOTE — Addendum Note (Signed)
Addended by: Evans LanceSTOVER, Denaya Horn W on: 04/30/2017 02:56 PM   Modules accepted: Orders

## 2017-04-30 NOTE — Assessment & Plan Note (Signed)
History of peripheral arterial disease status post right common iliac artery stenting June 2016 patient did have occluded SFAs bilaterally. She underwent staged right SFA directional atherectomy and drug-eluting balloon angioplasty 09/04/15 with subsequent improvement in her Dopplers and her right ABI. Her claudication did improve on that side. She had repeat Dopplers performed 01/15/17 revealed a patent filling a patent iliac stent mild-to-moderate disease in her right SFA. Her right ABI was 0.77  And left ABI was 0.63.

## 2017-04-30 NOTE — Assessment & Plan Note (Signed)
History of hyperlipidemia on statin therapy with recent lipid profile performed 11/14/16 revealed a total cholesterol 175, LDL 76 and HDL of 84.

## 2017-05-14 NOTE — Progress Notes (Signed)
Subjective:   Cheryl Atkinson is a 83 y.o. female who presents for Medicare Annual (Subsequent) preventive examination.  Review of Systems:  No ROS.  Medicare Wellness Visit. Additional risk factors are reflected in the social history.  Cardiac Risk Factors include: advanced age (>2355men, 76>65 women);smoking/ tobacco exposure;dyslipidemia;hypertension;sedentary lifestyle  Sleep patterns: no sleep issues and feels rested on waking.   Home Safety/Smoke Alarms: Feels safe in home. Smoke alarms in place. Wears LifeAlert pendant 'all the time' during the day but does take it off when she goes to bed. Living environment; residence and Firearm Safety: Lives w/ son and 2 inside cats and a dog. Uses cane at home and rollator when out running errands. Has a wheelchair to use for longer outings.  1-story house/ trailer, walk-in shower, no firearms. Grab bars in shower, raised toilet seat. Seat Belt Safety/Bike Helmet: Wears seat belt.   Counseling:   Eye Exam- Follows w/ [facility] for routine exams and Dr. Ashley RoyaltyMatthews for macular degeneration. Dental- Full dentures, does not follow w/ dentist regularly.  Female:   Pap- N/A, hysterectomy     Mammo- Aged out      Dexa scan- last 03/18/16. Osteoporosis.   CCS- Aged out     Objective:     Vitals: BP (!) 122/58 (BP Location: Left Arm, Patient Position: Sitting, Cuff Size: Large)   Pulse 70   Temp 98.2 F (36.8 C) (Oral)   Ht 5\' 6"  (1.676 m)   Wt 163 lb (73.9 kg)   SpO2 97%   BMI 26.31 kg/m   Body mass index is 26.31 kg/m.   Tobacco History  Smoking Status  . Former Smoker  . Packs/day: 0.50  . Years: 60.00  . Types: Cigarettes  . Quit date: 04/02/2015  Smokeless Tobacco  . Never Used     Counseling given: Not Answered   Past Medical History:  Diagnosis Date  . Anemia   . Arthritis    "shoulders" (05/04/2015)  . Cellulitis of right lower extremity 11/27/2013  . Chronic lower back pain   . Constipation   . GERD  (gastroesophageal reflux disease)   . History of hiatal hernia   . Hypertension   . Osteoporosis   . Peripheral arterial disease (HCC)   . Restless leg syndrome   . Thyroid disease    Past Surgical History:  Procedure Laterality Date  . ABDOMINAL AORTAGRAM  05/04/2015   Procedure: Abdominal Aortagram;  Surgeon: Runell GessJonathan J Berry, MD;  [facility];  Service: Cardiovascular;;  . APPENDECTOMY    . CATARACT EXTRACTION W/ INTRAOCULAR LENS  IMPLANT, BILATERAL Bilateral   . I&D EXTREMITY Right 12/22/2016   Procedure: IRRIGATION AND DEBRIDEMENT EXTREMITY;  Surgeon: Mack Hookavid Thompson, MD;  [facility];  Service: Orthopedics;  Laterality: Right;  . PERIPHERAL VASCULAR CATHETERIZATION N/A 05/04/2015   Procedure: Lower Extremity Angiography;  Surgeon: Runell GessJonathan J Berry, MD;  [facility];  Service: Cardiovascular;  Laterality: N/A;  . PERIPHERAL VASCULAR CATHETERIZATION  05/04/2015   Procedure: Peripheral Vascular Intervention;  Surgeon: Runell GessJonathan J Berry, MD;  [facility];  Service: Cardiovascular;;  RCIA - 7x22 ICAST  . PERIPHERAL VASCULAR CATHETERIZATION Right 09/04/2015   Procedure: Peripheral Vascular Atherectomy;  Surgeon: Runell GessJonathan J Berry, MD;  [facility];  Service: Cardiovascular;  Laterality: Right;  SFA  . sfa Right 09/04/2015   de balloon  . THYROID SURGERY Right ?2013   "had goiter taken off my neck"  .  TONSILLECTOMY    . VAGINAL HYSTERECTOMY     Family History  Problem Relation Age of Onset  . Heart disease Father   . Coronary artery disease Unknown    History  Sexual Activity  . Sexual activity: No    Outpatient Encounter Prescriptions as of 05/15/2017  Medication Sig  . aspirin EC 81 MG EC tablet Take 1 tablet (81 mg total) by mouth daily.  Marland Kitchen atorvastatin (LIPITOR) 40 MG tablet Take 1 tablet (40 mg total) by mouth daily at 6 PM.  . clopidogrel (PLAVIX) 75 MG tablet Take 1 tablet (75 mg total) by mouth daily with  breakfast.  . CONZIP 300 MG CP24 Take 300 mg by mouth every morning.  . Multiple Vitamins-Minerals (PRESERVISION AREDS 2 PO) Take 1 tablet by mouth 2 (two) times daily.  . Olmesartan-Amlodipine-HCTZ (TRIBENZOR) 40-10-12.5 MG TABS Take 1 tablet by mouth every morning.  . polyethylene glycol (MIRALAX / GLYCOLAX) packet Take 17 g by mouth at bedtime as needed for mild constipation. Reported on 03/14/2016  . rotigotine (NEUPRO) 2 MG/24HR APPLY 1 PATCH TO SKIN DAILY  . traMADol (ULTRAM) 50 MG tablet Take 50 mg by mouth at bedtime as needed for moderate pain.   No facility-administered encounter medications on file as of 05/15/2017.     Activities of Daily Living In your present state of health, do you have any difficulty performing the following activities: 05/15/2017  Hearing? N  Vision? N  Difficulty concentrating or making decisions? N  Walking or climbing stairs? Y  Dressing or bathing? N  Doing errands, shopping? N  Preparing Food and eating ? N  Using the Toilet? N  In the past six months, have you accidently leaked urine? Y  Do you have problems with loss of bowel control? N  Managing your Medications? N  Managing your Finances? N  Housekeeping or managing your Housekeeping? Y  Some recent data might be hidden    Patient Care Team: Shelva Majestic, MD as PCP - General (Family Medicine) Runell Gess, MD as Consulting Physician (Cardiology) Letta Kocher, MD as Consulting Physician (Rehabilitation) Sherrie George, MD as Consulting Physician (Ophthalmology)    Assessment:    Physical assessment deferred to PCP.  Exercise Activities and Dietary recommendations Current Exercise Habits: The patient does not participate in regular exercise at present, Exercise limited by: Other - see comments (chronic leg and back pain)  Diet (meal preparation, eat out, water intake, caffeinated beverages, dairy products, fruits and vegetables): in general, a "healthy" diet  , well  balanced, on average, 2 meals per day.'I love to eat.' Drinks about 8 oz of water daily and 1 regular Coke daily and decaf coffee with breakfast. Breakfast: cereal Lunch: sandwich Dinner: varies-cooks or goes out to eat w/ son  Goals    . Increase water intake          Start drinking at least a full glass of water with medication and at meals.      Fall Risk Fall Risk  05/15/2017 03/14/2016 06/30/2015 01/21/2014  Falls in the past year? Yes Yes No No  Number falls in past yr: 1 1 - -  Injury with Fall? No No - -  Risk for fall due to : Impaired balance/gait Impaired balance/gait - -  Follow up - Education provided - -   Depression Screen PHQ 2/9 Scores 05/15/2017 03/14/2016 06/30/2015 01/21/2014  PHQ - 2 Score 2 1 0 0  PHQ- 9 Score 5 13 - -  Cognitive Function MMSE - Mini Mental State Exam 05/15/2017  Orientation to time 5  Orientation to Place 5  Registration 3  Attention/ Calculation 5  Recall 1  Language- name 2 objects 2  Language- repeat 1  Language- follow 3 step command 3  Language- read & follow direction 1  Write a sentence 1  Copy design 0  Total score 27        Immunization History  Administered Date(s) Administered  . Influenza Split 08/20/2011, 08/28/2012, 09/29/2012  . Influenza Whole 09/25/2007, 09/20/2009  . Influenza, High Dose Seasonal PF 09/03/2014  . Influenza,inj,Quad PF,36+ Mos 08/30/2013, 09/05/2015  . Influenza-Unspecified 08/22/2016  . Pneumococcal Conjugate-13 09/29/2014  . Pneumococcal Polysaccharide-23 12/02/2005  . Td 11/01/1993  . Tdap 11/27/2013  . Zoster 02/17/2007   Screening Tests Health Maintenance  Topic Date Due  . INFLUENZA VACCINE  07/02/2017  . TETANUS/TDAP  11/28/2023  . DEXA SCAN  Completed  . PNA vac Low Risk Adult  Completed      Plan:    Follow-up w/ PCP as directed.   Bring a copy of your advance directives to your next office visit.  Discussed regular toileting to hopefully minimize urinary  overflow/incontinence.   I have personally reviewed and noted the following in the patient's chart:   . Medical and social history . Use of alcohol, tobacco or illicit drugs  . Current medications and supplements . Functional ability and status . Nutritional status . Physical activity . Advanced directives . List of other physicians . Vitals . Screenings to include cognitive, depression, and falls . Referrals and appointments  In addition, I have reviewed and discussed with patient certain preventive protocols, quality metrics, and best practice recommendations. A written personalized care plan for preventive services as well as general preventive health recommendations were provided to patient.     Starla Link, RN  05/15/2017   I have reviewed and agree with note, evaluation, plan.   Tana Conch, MD

## 2017-05-15 ENCOUNTER — Encounter: Payer: Self-pay | Admitting: Family Medicine

## 2017-05-15 ENCOUNTER — Ambulatory Visit (INDEPENDENT_AMBULATORY_CARE_PROVIDER_SITE_OTHER): Payer: Medicare Other | Admitting: Family Medicine

## 2017-05-15 VITALS — BP 122/58 | HR 70 | Temp 98.2°F | Ht 66.0 in | Wt 163.0 lb

## 2017-05-15 DIAGNOSIS — R739 Hyperglycemia, unspecified: Secondary | ICD-10-CM

## 2017-05-15 DIAGNOSIS — E042 Nontoxic multinodular goiter: Secondary | ICD-10-CM | POA: Diagnosis not present

## 2017-05-15 DIAGNOSIS — E785 Hyperlipidemia, unspecified: Secondary | ICD-10-CM | POA: Diagnosis not present

## 2017-05-15 DIAGNOSIS — I701 Atherosclerosis of renal artery: Secondary | ICD-10-CM | POA: Diagnosis not present

## 2017-05-15 DIAGNOSIS — J438 Other emphysema: Secondary | ICD-10-CM

## 2017-05-15 DIAGNOSIS — I10 Essential (primary) hypertension: Secondary | ICD-10-CM | POA: Diagnosis not present

## 2017-05-15 DIAGNOSIS — I739 Peripheral vascular disease, unspecified: Secondary | ICD-10-CM

## 2017-05-15 DIAGNOSIS — Z0000 Encounter for general adult medical examination without abnormal findings: Secondary | ICD-10-CM | POA: Diagnosis not present

## 2017-05-15 LAB — BASIC METABOLIC PANEL
BUN: 27 mg/dL — ABNORMAL HIGH (ref 6–23)
CO2: 28 mEq/L (ref 19–32)
Calcium: 9.5 mg/dL (ref 8.4–10.5)
Chloride: 101 mEq/L (ref 96–112)
Creatinine, Ser: 0.83 mg/dL (ref 0.40–1.20)
GFR: 69.66 mL/min (ref >60.00)
Glucose, Bld: 88 mg/dL (ref 70–99)
Potassium: 3.9 mEq/L (ref 3.5–5.1)
Sodium: 138 mEq/L (ref 135–145)

## 2017-05-15 LAB — CBC
HCT: 35.2 % — ABNORMAL LOW (ref 36.0–46.0)
Hemoglobin: 11.7 g/dL — ABNORMAL LOW (ref 12.0–15.0)
MCHC: 33.2 g/dL (ref 30.0–36.0)
MCV: 86.4 fl (ref 78.0–100.0)
Platelets: 277 10*3/uL (ref 150.0–400.0)
RBC: 4.07 Mil/uL (ref 3.87–5.11)
RDW: 14.7 % (ref 11.5–15.5)
WBC: 8.9 10*3/uL (ref 4.0–10.5)

## 2017-05-15 LAB — LDL CHOLESTEROL, DIRECT: Direct LDL: 74 mg/dL

## 2017-05-15 LAB — HEMOGLOBIN A1C: Hgb A1c MFr Bld: 6.5 % (ref 4.6–6.5)

## 2017-05-15 LAB — TSH: TSH: 0.99 u[IU]/mL (ref 0.35–4.50)

## 2017-05-15 NOTE — Progress Notes (Signed)
Subjective:  Cheryl Atkinson is a 83 y.o. year old very pleasant female patient who presents for/with See problem oriented charting ROS- deals with back pain, restless legs controlled on neuopro. No chest pain or shortness of breath. Some pain in legs with walking   Past Medical History-  Patient Active Problem List   Diagnosis Date Noted  . Memory loss 04/11/2016    Priority: High  . Renal artery stenosis (HCC) 09/27/2015    Priority: High  . Claudication (HCC) 05/04/2015    Priority: High  . Atherosclerotic PVD with intermittent claudication (HCC) 04/26/2015    Priority: High  . DNR (do not resuscitate) 09/29/2014    Priority: High  . LOW BACK PAIN 09/25/2007    Priority: High  . BPPV (benign paroxysmal positional vertigo) 07/12/2016    Priority: Medium  . Hyperlipidemia 06/30/2015    Priority: Medium  . Former smoker 09/29/2014    Priority: Medium  . Hyperglycemia 07/19/2014    Priority: Medium  . COPD (chronic obstructive pulmonary disease) (HCC) 09/20/2009    Priority: Medium  . Iron deficiency anemia 11/09/2007    Priority: Medium  . RESTLESS LEG SYNDROME 09/25/2007    Priority: Medium  . Essential hypertension 09/25/2007    Priority: Medium  . GERD 09/25/2007    Priority: Medium  . Osteoporosis 09/25/2007    Priority: Medium  . Cat bite of right hand 12/21/2016    Priority: Low  . Mallet toe of right foot 10/20/2015    Priority: Low  . Tinnitus 12/31/2014    Priority: Low  . Multinodular goiter 08/30/2013    Priority: Low  . Spinal stenosis of lumbar region at multiple levels 09/29/2012    Priority: Low  . HIP PAIN, BILATERAL 07/16/2010    Priority: Low  . CONSTIPATION, CHRONIC 09/20/2009    Priority: Low  . History of UTI 11/09/2007    Priority: Low    Medications- reviewed and updated Current Outpatient Prescriptions  Medication Sig Dispense Refill  . aspirin EC 81 MG EC tablet Take 1 tablet (81 mg total) by mouth daily.    Marland Kitchen. atorvastatin  (LIPITOR) 40 MG tablet Take 1 tablet (40 mg total) by mouth daily at 6 PM. 90 tablet 3  . clopidogrel (PLAVIX) 75 MG tablet Take 1 tablet (75 mg total) by mouth daily with breakfast. 90 tablet 3  . CONZIP 300 MG CP24 Take 300 mg by mouth every morning.    . Multiple Vitamins-Minerals (PRESERVISION AREDS 2 PO) Take 1 tablet by mouth 2 (two) times daily.    . Olmesartan-Amlodipine-HCTZ (TRIBENZOR) 40-10-12.5 MG TABS Take 1 tablet by mouth every morning. 90 tablet 3  . polyethylene glycol (MIRALAX / GLYCOLAX) packet Take 17 g by mouth at bedtime as needed for mild constipation. Reported on 03/14/2016    . rotigotine (NEUPRO) 2 MG/24HR APPLY 1 PATCH TO SKIN DAILY 90 patch 1  . traMADol (ULTRAM) 50 MG tablet Take 50 mg by mouth at bedtime as needed for moderate pain.     No current facility-administered medications for this visit.     Objective: BP (!) 122/58 (BP Location: Left Arm, Patient Position: Sitting, Cuff Size: Large)   Pulse 70   Temp 98.2 F (36.8 C) (Oral)   Ht 5\' 6"  (1.676 m)   Wt 163 lb (73.9 kg)   SpO2 97%   BMI 26.31 kg/m  Gen: NAD, resting comfortably CV: RRR no murmurs rubs or gallops Lungs: CTAB no crackles, wheeze, rhonchi Abdomen: overweight Ext:  no edema Skin: warm, dry Neuro: walks with walker  Assessment/Plan:  Claudication (HCC) Sits down every few minutes with walking due to back pain but does have some very mild leg pain at times- back is more limiting though. Compliant with plavix and statin and aspirin  COPD (chronic obstructive pulmonary disease) (HCC) S: copd untreated.  Symptoms drastically improved after quitting smoking A/P: continue to monitor only. No inhalers  Essential hypertension S: controlled on tribenzor as above.  ASCVD 10 year risk calculation if age 83-79: already on statin BP Readings from Last 3 Encounters:  05/15/17 (!) 122/58  04/30/17 (!) 116/58  02/12/17 134/78  A/P: We discussed blood pressure goal of <140/90 though 150 would  also be reasonable per [facility] for systolic. Continue current meds:  Hyperlipidemia S: very mildly poroly controlled on atorvastatin with PAD history and LDL goal under 70. No myalgias. Has lost 4 lbs Lab Results  Component Value Date   CHOL 175 11/14/2016   HDL 84.60 11/14/2016   LDLCALC 76 11/14/2016   LDLDIRECT 70.0 03/14/2016   TRIG 76.0 11/14/2016   CHOLHDL 2 11/14/2016   A/P: avoiding statin increase at her age, stability of PAD, DM risk. Update LDL today  Hyperglycemia Prior elevations in a1c- will update today  Multinodular goiter From overvie. Problem noted 2014 "Sudden onset not fully characterized. S/p removal but was told may come back. Saw endocrine. Remaining right sided cyst. " Update tsh   Return in about 3 months (around 08/15/2017).  Orders Placed This Encounter  Procedures  . LDL cholesterol, direct    [place]  . Hemoglobin A1c    [place]  . Basic metabolic panel    [place]  . TSH    [place]  . CBC    [place]   Return precautions advised.  Tana Conch, MD

## 2017-05-15 NOTE — Patient Instructions (Addendum)
See you in September before I transition to [place]  No changes planned today  Please stop by lab before you go  Then our nurse will see you   [name] , Thank you for taking time to come for your Medicare Wellness Visit. I appreciate your ongoing commitment to your health goals. Please review the following plan we discussed and let me know if I can assist you in the future.   Bring a copy of your advance directives to your next office visit.  These are the goals we discussed: Goals    . Increase water intake          Start drinking at least a full glass of water with medication and at meals.       This is a list of the screening recommended for you and due dates:  Health Maintenance  Topic Date Due  . Flu Shot  07/02/2017  . Tetanus Vaccine  11/28/2023  . DEXA scan (bone density measurement)  Completed  . Pneumonia vaccines  Completed    Preventive Care 37 Years and Older, Female Preventive care refers to lifestyle choices and visits with your health care provider that can promote health and wellness. What does preventive care include?  A yearly physical exam. This is also called an annual well check.  Dental exams once or twice a year.  Routine eye exams. Ask your health care provider how often you should have your eyes checked.  Personal lifestyle choices, including: ? Daily care of your teeth and gums. ? Regular physical activity. ? Eating a healthy diet. ? Avoiding tobacco and drug use. ? Limiting alcohol use. ? Practicing safe sex. ? Taking low-dose aspirin every day. ? Taking vitamin and mineral supplements as recommended by your health care provider. What happens during an annual well check? The services and screenings done by your health care provider during your annual well check will depend on your age, overall health, lifestyle risk factors, and family history of disease. Counseling Your health care provider may ask you questions about  your:  Alcohol use.  Tobacco use.  Drug use.  Emotional well-being.  Home and relationship well-being.  Sexual activity.  Eating habits.  History of falls.  Memory and ability to understand (cognition).  Work and work Statistician.  Reproductive health.  Screening You may have the following tests or measurements:  Height, weight, and BMI.  Blood pressure.  Lipid and cholesterol levels. These may be checked every 5 years, or more frequently if you are over 78 years old.  Skin check.  Lung cancer screening. You may have this screening every year starting at age 78 if you have a 30-pack-year history of smoking and currently smoke or have quit within the past 15 years.  Fecal occult blood test (FOBT) of the stool. You may have this test every year starting at age 61.  Flexible sigmoidoscopy or colonoscopy. You may have a sigmoidoscopy every 5 years or a colonoscopy every 10 years starting at age 85.  Hepatitis C blood test.  Hepatitis B blood test.  Sexually transmitted disease (STD) testing.  Diabetes screening. This is done by checking your blood sugar (glucose) after you have not eaten for a while (fasting). You may have this done every 1-3 years.  Bone density scan. This is done to screen for osteoporosis. You may have this done starting at age 51.  Mammogram. This may be done every 1-2 years. Talk to your health care provider about  how often you should have regular mammograms.  Talk with your health care provider about your test results, treatment options, and if necessary, the need for more tests. Vaccines Your health care provider may recommend certain vaccines, such as:  Influenza vaccine. This is recommended every year.  Tetanus, diphtheria, and acellular pertussis (Tdap, Td) vaccine. You may need a Td booster every 10 years.  Varicella vaccine. You may need this if you have not been vaccinated.  Zoster vaccine. You may need this after age  67.  Measles, mumps, and rubella (MMR) vaccine. You may need at least one dose of MMR if you were born in 1957 or later. You may also need a second dose.  Pneumococcal 13-valent conjugate (PCV13) vaccine. One dose is recommended after age 23.  Pneumococcal polysaccharide (PPSV23) vaccine. One dose is recommended after age 79.  Meningococcal vaccine. You may need this if you have certain conditions.  Hepatitis A vaccine. You may need this if you have certain conditions or if you travel or work in places where you may be exposed to hepatitis A.  Hepatitis B vaccine. You may need this if you have certain conditions or if you travel or work in places where you may be exposed to hepatitis B.  Haemophilus influenzae type b (Hib) vaccine. You may need this if you have certain conditions.  Talk to your health care provider about which screenings and vaccines you need and how often you need them. This information is not intended to replace advice given to you by your health care provider. Make sure you discuss any questions you have with your health care provider. Document Released: 12/15/2015 Document Revised: 08/07/2016 Document Reviewed: 09/19/2015 Elsevier Interactive Patient Education  2017 [employer].

## 2017-05-15 NOTE — Assessment & Plan Note (Signed)
S: copd untreated.  Symptoms drastically improved after quitting smoking A/P: continue to monitor only. No inhalers

## 2017-05-15 NOTE — Assessment & Plan Note (Addendum)
Sits down every few minutes with walking due to back pain but does have some very mild leg pain at times- back is more limiting though. Compliant with plavix and statin and aspirin

## 2017-05-15 NOTE — Assessment & Plan Note (Signed)
From overvie. Problem noted 2014 "Sudden onset not fully characterized. S/p removal but was told may come back. Saw endocrine. Remaining right sided cyst. " Update tsh

## 2017-05-15 NOTE — Assessment & Plan Note (Signed)
S: very mildly poroly controlled on atorvastatin with PAD history and LDL goal under 70. No myalgias. Has lost 4 lbs Lab Results  Component Value Date   CHOL 175 11/14/2016   HDL 84.60 11/14/2016   LDLCALC 76 11/14/2016   LDLDIRECT 70.0 03/14/2016   TRIG 76.0 11/14/2016   CHOLHDL 2 11/14/2016   A/P: avoiding statin increase at her age, stability of PAD, DM risk. Update LDL today

## 2017-05-15 NOTE — Assessment & Plan Note (Signed)
Prior elevations in a1c- will update today

## 2017-05-15 NOTE — Assessment & Plan Note (Signed)
S: controlled on tribenzor as above.  ASCVD 10 year risk calculation if age 81-79: already on statin BP Readings from Last 3 Encounters:  05/15/17 (!) 122/58  04/30/17 (!) 116/58  02/12/17 134/78  A/P: We discussed blood pressure goal of <140/90 though 150 would also be reasonable per [facility] for systolic. Continue current meds:

## 2017-05-30 ENCOUNTER — Telehealth: Payer: Self-pay | Admitting: Family Medicine

## 2017-05-30 ENCOUNTER — Other Ambulatory Visit: Payer: Self-pay

## 2017-05-30 MED ORDER — ROTIGOTINE 2 MG/24HR TD PT24: MEDICATED_PATCH | TRANSDERMAL | 1 refills | Status: DC

## 2017-05-30 NOTE — Telephone Encounter (Signed)
Pt request refill   rotigotine (NEUPRO) 2 MG/24HR  [facility] [place] [id number]

## 2017-05-30 NOTE — Telephone Encounter (Signed)
Prescription sent to pharmacy as requested.

## 2017-06-23 ENCOUNTER — Other Ambulatory Visit: Payer: Self-pay

## 2017-07-05 ENCOUNTER — Encounter (HOSPITAL_COMMUNITY): Payer: Self-pay | Admitting: Emergency Medicine

## 2017-07-05 ENCOUNTER — Emergency Department (HOSPITAL_COMMUNITY)
Admission: EM | Admit: 2017-07-05 | Discharge: 2017-07-05 | Disposition: A | Discharge status: Home / Self Care | Payer: Medicare Other | Attending: Emergency Medicine | Admitting: Emergency Medicine

## 2017-07-05 DIAGNOSIS — N390 Urinary tract infection, site not specified: Secondary | ICD-10-CM | POA: Diagnosis not present

## 2017-07-05 DIAGNOSIS — Z79899 Other long term (current) drug therapy: Secondary | ICD-10-CM | POA: Diagnosis not present

## 2017-07-05 DIAGNOSIS — Z7902 Long term (current) use of antithrombotics/antiplatelets: Secondary | ICD-10-CM | POA: Insufficient documentation

## 2017-07-05 DIAGNOSIS — J449 Chronic obstructive pulmonary disease, unspecified: Secondary | ICD-10-CM | POA: Diagnosis not present

## 2017-07-05 DIAGNOSIS — Z87891 Personal history of nicotine dependence: Secondary | ICD-10-CM | POA: Diagnosis not present

## 2017-07-05 DIAGNOSIS — Z7982 Long term (current) use of aspirin: Secondary | ICD-10-CM | POA: Insufficient documentation

## 2017-07-05 DIAGNOSIS — R51 Headache: Secondary | ICD-10-CM | POA: Diagnosis not present

## 2017-07-05 DIAGNOSIS — R300 Dysuria: Secondary | ICD-10-CM | POA: Diagnosis present

## 2017-07-05 DIAGNOSIS — I10 Essential (primary) hypertension: Secondary | ICD-10-CM | POA: Insufficient documentation

## 2017-07-05 LAB — URINALYSIS, ROUTINE W REFLEX MICROSCOPIC
Bilirubin Urine: NEGATIVE
Glucose, UA: NEGATIVE mg/dL
Hgb urine dipstick: NEGATIVE
Ketones, ur: NEGATIVE mg/dL
Nitrite: NEGATIVE
Protein, ur: NEGATIVE mg/dL
Specific Gravity, Urine: 1.012 (ref 1.005–1.030)
pH: 6 (ref 5.0–8.0)

## 2017-07-05 MED ORDER — DEXTROSE 5 % IV SOLN
1.0000 g | Freq: Once | INTRAVENOUS | Status: AC
  Administered 2017-07-05: 1 g via INTRAVENOUS
  Filled 2017-07-05: qty 10

## 2017-07-05 MED ORDER — CEPHALEXIN 500 MG PO CAPS: 500.0000 mg | ORAL_CAPSULE | Freq: Four times a day (QID) | ORAL | 0 refills | Status: DC

## 2017-07-05 NOTE — ED Provider Notes (Signed)
[facility]-EMERGENCY DEPT Provider Note   CSN: [id number] Arrival date & time: 07/05/17  1102     History   Chief Complaint Chief Complaint  Patient presents with  . Dysuria    HPI Crissie ReeseBetty L Colson is a 83 y.o. female.  HPI  83 year old female history of urinary tract infections presents today complaining of pain with urination. She states she is having pain in the suprapubic area that is continuous and worsens when she passes urine. However, frequency of urination has decreased. This is similar to previous urinary tract infections. She is also having some headache. She denies, chills, nausea, vomiting, or diarrhea. She has not been to for urinary tract infection for several months per her report.  Past Medical History:  Diagnosis Date  . Anemia   . Arthritis    "shoulders" (05/04/2015)  . Cellulitis of right lower extremity 11/27/2013  . Chronic lower back pain   . Constipation   . GERD (gastroesophageal reflux disease)   . History of hiatal hernia   . Hypertension   . Osteoporosis   . Peripheral arterial disease (HCC)   . Restless leg syndrome   . Thyroid disease     Patient Active Problem List   Diagnosis Date Noted  . Cat bite of right hand 12/21/2016  . BPPV (benign paroxysmal positional vertigo) 07/12/2016  . Memory loss 04/11/2016  . Mallet toe of right foot 10/20/2015  . Renal artery stenosis (HCC) 09/27/2015  . Hyperlipidemia 06/30/2015  . Claudication (HCC) 05/04/2015  . Atherosclerotic PVD with intermittent claudication (HCC) 04/26/2015  . Tinnitus 12/31/2014  . Former smoker 09/29/2014  . DNR (do not resuscitate) 09/29/2014  . Hyperglycemia 07/19/2014  . Multinodular goiter 08/30/2013  . Spinal stenosis of lumbar region at multiple levels 09/29/2012  . HIP PAIN, BILATERAL 07/16/2010  . COPD (chronic obstructive pulmonary disease) (HCC) 09/20/2009  . CONSTIPATION, CHRONIC 09/20/2009  . Iron deficiency anemia 11/09/2007  . History of UTI 11/09/2007  .  RESTLESS LEG SYNDROME 09/25/2007  . Essential hypertension 09/25/2007  . GERD 09/25/2007  . LOW BACK PAIN 09/25/2007  . Osteoporosis 09/25/2007    Past Surgical History:  Procedure Laterality Date  . ABDOMINAL AORTAGRAM  05/04/2015   Procedure: Abdominal Aortagram;  Surgeon: Runell GessJonathan J Berry, MD;  [facility];  Service: Cardiovascular;;  . APPENDECTOMY    . CATARACT EXTRACTION W/ INTRAOCULAR LENS  IMPLANT, BILATERAL Bilateral   . I&D EXTREMITY Right 12/22/2016   Procedure: IRRIGATION AND DEBRIDEMENT EXTREMITY;  Surgeon: Mack Hookavid Thompson, MD;  [facility];  Service: Orthopedics;  Laterality: Right;  . PERIPHERAL VASCULAR CATHETERIZATION N/A 05/04/2015   Procedure: Lower Extremity Angiography;  Surgeon: Runell GessJonathan J Berry, MD;  [facility];  Service: Cardiovascular;  Laterality: N/A;  . PERIPHERAL VASCULAR CATHETERIZATION  05/04/2015   Procedure: Peripheral Vascular Intervention;  Surgeon: Runell GessJonathan J Berry, MD;  [facility];  Service: Cardiovascular;;  RCIA - [id number] ICAST  . PERIPHERAL VASCULAR CATHETERIZATION Right 09/04/2015   Procedure: Peripheral Vascular Atherectomy;  Surgeon: Runell GessJonathan J Berry, MD;  [facility];  Service: Cardiovascular;  Laterality: Right;  SFA  . sfa Right 09/04/2015   de balloon  . THYROID SURGERY Right ?2013   "had goiter taken off my neck"  . TONSILLECTOMY    . VAGINAL HYSTERECTOMY      OB History    No data available       Home Medications    Prior to Admission medications  Medication Sig Start Date End Date Taking? Authorizing Provider  aspirin EC 81 MG EC tablet Take 1 tablet (81 mg total) by mouth daily. 05/05/15  Yes Kilroy, Luke K, PA-C  atorvastatin (LIPITOR) 40 MG tablet Take 1 tablet (40 mg total) by mouth daily at 6 PM. 04/30/17  Yes Runell Gess, MD  clopidogrel (PLAVIX) 75 MG tablet Take 1 tablet (75 mg total) by mouth daily with breakfast. 04/30/17  Yes Runell Gess, MD    CONZIP 300 MG CP24 Take 300 mg by mouth every morning. 12/13/16  Yes [provider]  Multiple Vitamins-Minerals (PRESERVISION AREDS 2 PO) Take 1 tablet by mouth 2 (two) times daily.   Yes [provider]  Olmesartan-Amlodipine-HCTZ (TRIBENZOR) 40-10-12.5 MG TABS Take 1 tablet by mouth every morning. 04/30/17  Yes Runell Gess, MD  polyethylene glycol [facility] / Ethelene Hal) packet Take 17 g by mouth at bedtime as needed for mild constipation. Reported on 03/14/2016   Yes [provider]  rotigotine (NEUPRO) 2 MG/24HR APPLY 1 PATCH TO SKIN DAILY Patient taking differently: Place 1 patch onto the skin at bedtime.  05/30/17  Yes Shelva Majestic, MD  traMADol (ULTRAM-ER) 300 MG 24 hr tablet Take 300 mg by mouth daily.   Yes [provider]  traMADol (ULTRAM) 50 MG tablet Take 50 mg by mouth at bedtime as needed for moderate pain.    [provider]    Family History Family History  Problem Relation Age of Onset  . Heart disease Father   . Coronary artery disease Unknown     Social History Social History  Substance Use Topics  . Smoking status: Former Smoker    Packs/day: 0.50    Years: 60.00    Types: Cigarettes    Quit date: 04/02/2015  . Smokeless tobacco: Never Used  . Alcohol use No     Allergies   Codeine   Review of Systems Review of Systems  All other systems reviewed and are negative.    Physical Exam Updated Vital Signs BP 133/61 (BP Location: Right Arm)   Pulse 64   Temp 98 F (36.7 C) (Oral)   Resp 16   Ht 1.676 m (5\' 6" )   Wt 72.6 kg (160 lb)   SpO2 100%   BMI 25.82 kg/m   Physical Exam  Constitutional: She is oriented to person, place, and time. She appears well-developed and well-nourished. No distress.  HENT:  Head: Normocephalic and atraumatic.  Right Ear: External ear normal.  Left Ear: External ear normal.  Nose: Nose normal.  Eyes: Pupils are equal, round, and reactive to light. Conjunctivae and  EOM are normal.  Neck: Normal range of motion. Neck supple.  Cardiovascular: Normal rate.   Pulmonary/Chest: Effort normal.  Abdominal: Soft.  Musculoskeletal: Normal range of motion.  Neurological: She is alert and oriented to person, place, and time. She exhibits normal muscle tone. Coordination normal.  Skin: Skin is warm and dry.  Psychiatric: She has a normal mood and affect. Her behavior is normal. Thought content normal.  Nursing note and vitals reviewed.    ED Treatments / Results  Labs (all labs ordered are listed, but only abnormal results are displayed) Labs Reviewed  URINALYSIS, ROUTINE W REFLEX MICROSCOPIC    EKG  EKG Interpretation None       Radiology No results found.  Procedures Procedures (including critical care time)  Medications Ordered in ED Medications - No data to display   Initial Impression /  Assessment and Plan / ED Course  I have reviewed the triage vital signs and the nursing notes.  Pertinent labs & imaging results that were available during my care of the patient were reviewed by me and considered in my medical decision making (see chart for details).    Urine c.w. uti- Rocephin given here.  Plan keflex op. Discussed results, plan, need for follow-up, and return precautions with the patient and she voices understanding.  Final Clinical Impressions(s) / ED Diagnoses   Final diagnoses:  Lower urinary tract infectious disease    New Prescriptions New Prescriptions   CEPHALEXIN (KEFLEX) 500 MG CAPSULE    Take 1 capsule (500 mg total) by mouth 4 (four) times daily.     Margarita Grizzleay, Gaelen Brager, MD 07/05/17 [phone]

## 2017-07-05 NOTE — ED Triage Notes (Signed)
Pt c/o pain with urination onset yesterday.

## 2017-07-10 DIAGNOSIS — G894 Chronic pain syndrome: Secondary | ICD-10-CM | POA: Diagnosis not present

## 2017-07-10 DIAGNOSIS — Z79891 Long term (current) use of opiate analgesic: Secondary | ICD-10-CM | POA: Diagnosis not present

## 2017-07-10 DIAGNOSIS — M47817 Spondylosis without myelopathy or radiculopathy, lumbosacral region: Secondary | ICD-10-CM | POA: Diagnosis not present

## 2017-07-10 DIAGNOSIS — M5136 Other intervertebral disc degeneration, lumbar region: Secondary | ICD-10-CM | POA: Diagnosis not present

## 2017-07-10 DIAGNOSIS — Z79899 Other long term (current) drug therapy: Secondary | ICD-10-CM | POA: Diagnosis not present

## 2017-08-15 ENCOUNTER — Ambulatory Visit: Payer: Medicare Other | Admitting: Family Medicine

## 2017-08-19 ENCOUNTER — Ambulatory Visit (INDEPENDENT_AMBULATORY_CARE_PROVIDER_SITE_OTHER): Payer: Medicare Other | Admitting: Family Medicine

## 2017-08-19 ENCOUNTER — Encounter: Payer: Self-pay | Admitting: Family Medicine

## 2017-08-19 VITALS — BP 128/68 | HR 66 | Temp 98.5°F | Ht 66.0 in | Wt 165.2 lb

## 2017-08-19 DIAGNOSIS — I701 Atherosclerosis of renal artery: Secondary | ICD-10-CM | POA: Diagnosis not present

## 2017-08-19 DIAGNOSIS — Z23 Encounter for immunization: Secondary | ICD-10-CM | POA: Diagnosis not present

## 2017-08-19 DIAGNOSIS — R739 Hyperglycemia, unspecified: Secondary | ICD-10-CM

## 2017-08-19 DIAGNOSIS — I739 Peripheral vascular disease, unspecified: Secondary | ICD-10-CM

## 2017-08-19 DIAGNOSIS — I10 Essential (primary) hypertension: Secondary | ICD-10-CM | POA: Diagnosis not present

## 2017-08-19 LAB — POCT GLYCOSYLATED HEMOGLOBIN (HGB A1C): Hemoglobin A1C: 5.8

## 2017-08-19 NOTE — Patient Instructions (Addendum)
No changes today  Update labs next visit   Your a1c went down to 5.8 which is great. Last visit you were on the cusp of diabetes and your risk is way lower now- have to continue to eat well- cut down on sweets- be as active as possible

## 2017-08-19 NOTE — Assessment & Plan Note (Signed)
S:  continues to sit every few minutes of walking- back pain and mild leg pain at times that resolves with rest. Feels back is more limiting than legs. Compliant with palvix, aspirin, statin.  A/P: could push LDL to below 70 but so close at 76 so will not change statin

## 2017-08-19 NOTE — Assessment & Plan Note (Signed)
S: last a1c was up to 6.5. Interestingly despite weight gain and stating eating a little more sweets lately- a1c has improved Lab Results  Component Value Date   HGBA1C 5.8 08/19/2017  A/P: continue to check about every 6-12 months. Would only diagnose diabetes if 2 elevated in a row in 6.5 range- much higher than that would list as diabetes

## 2017-08-19 NOTE — Progress Notes (Signed)
Subjective:  Cheryl Atkinson is a 83 y.o. year old very pleasant female patient who presents for/with See problem oriented charting ROS- no anhedonia, depressed mood. Did have one fall- fell backwards when trying to make her bed. No chest pain or shortness of breath. Continued back pain- no worse   Past Medical History-  Patient Active Problem List   Diagnosis Date Noted  . Memory loss 04/11/2016    Priority: High  . Renal artery stenosis (HCC) 09/27/2015    Priority: High  . Claudication (HCC) 05/04/2015    Priority: High  . Atherosclerotic PVD with intermittent claudication (HCC) 04/26/2015    Priority: High  . DNR (do not resuscitate) 09/29/2014    Priority: High  . Hyperglycemia 07/19/2014    Priority: High  . LOW BACK PAIN 09/25/2007    Priority: High  . BPPV (benign paroxysmal positional vertigo) 07/12/2016    Priority: Medium  . Hyperlipidemia 06/30/2015    Priority: Medium  . Former smoker 09/29/2014    Priority: Medium  . COPD (chronic obstructive pulmonary disease) (HCC) 09/20/2009    Priority: Medium  . Iron deficiency anemia 11/09/2007    Priority: Medium  . RESTLESS LEG SYNDROME 09/25/2007    Priority: Medium  . Essential hypertension 09/25/2007    Priority: Medium  . GERD 09/25/2007    Priority: Medium  . Osteoporosis 09/25/2007    Priority: Medium  . Cat bite of right hand 12/21/2016    Priority: Low  . Mallet toe of right foot 10/20/2015    Priority: Low  . Tinnitus 12/31/2014    Priority: Low  . Multinodular goiter 08/30/2013    Priority: Low  . Spinal stenosis of lumbar region at multiple levels 09/29/2012    Priority: Low  . HIP PAIN, BILATERAL 07/16/2010    Priority: Low  . CONSTIPATION, CHRONIC 09/20/2009    Priority: Low  . History of UTI 11/09/2007    Priority: Low    Medications- reviewed and updated Current Outpatient Prescriptions  Medication Sig Dispense Refill  . aspirin EC 81 MG EC tablet Take 1 tablet (81 mg total) by mouth  daily.    Marland Kitchen atorvastatin (LIPITOR) 40 MG tablet Take 1 tablet (40 mg total) by mouth daily at 6 PM. 90 tablet 3  . clopidogrel (PLAVIX) 75 MG tablet Take 1 tablet (75 mg total) by mouth daily with breakfast. 90 tablet 3  . CONZIP 300 MG CP24 Take 300 mg by mouth every morning.    . Multiple Vitamins-Minerals (PRESERVISION AREDS 2 PO) Take 1 tablet by mouth 2 (two) times daily.    . Olmesartan-Amlodipine-HCTZ (TRIBENZOR) 40-10-12.5 MG TABS Take 1 tablet by mouth every morning. 90 tablet 3  . polyethylene glycol (MIRALAX / GLYCOLAX) packet Take 17 g by mouth at bedtime as needed for mild constipation. Reported on 03/14/2016    . rotigotine (NEUPRO) 2 MG/24HR APPLY 1 PATCH TO SKIN DAILY (Patient taking differently: Place 1 patch onto the skin at bedtime. ) 90 patch 1  . traMADol (ULTRAM) 50 MG tablet Take 50 mg by mouth at bedtime as needed for moderate pain.    . traMADol (ULTRAM-ER) 300 MG 24 hr tablet Take 300 mg by mouth daily.     No current facility-administered medications for this visit.     Objective: BP 128/68 (BP Location: Left Arm, Patient Position: Sitting, Cuff Size: Large)   Pulse 66   Temp 98.5 F (36.9 C) (Oral)   Ht  (1.676 m)   Wt  165 lb 3.2 oz (74.9 kg)   SpO2 93%   BMI 26.66 kg/m  Gen: NAD, resting comfortably CV: RRR no murmurs rubs or gallops Lungs: CTAB no crackles, wheeze, rhonchi Ext: no edema Skin: warm, dry Neuro: walks with rolling walker  Assessment/Plan:  phq9 of 5- previously- denies anhedonia or depressed mood today PHQ2 of 0.   Hyperglycemia S: last a1c was up to 6.5. Interestingly despite weight gain and stating eating a little more sweets lately- a1c has improved Lab Results  Component Value Date   HGBA1C 5.8 08/19/2017  A/P: continue to check about every 6-12 months. Would only diagnose diabetes if 2 elevated in a row in 6.5 range- much higher than that would list as diabetes  Essential hypertension S: controlled on tribenzor alone.   BP Readings from Last 3 Encounters:  08/19/17 128/68  07/05/17 139/61  05/15/17 (!) 122/58  A/P: We discussed blood pressure goal of <140/90. Continue current meds  Claudication (HCC) S:  continues to sit every few minutes of walking- back pain and mild leg pain at times that resolves with rest. Feels back is more limiting than legs. Compliant with palvix, aspirin, statin.  A/P: could push LDL to below 70 but so close at 74 so will not change statin   Return in about 6 months (around 02/16/2018) for follow up- or sooner if needed.  Orders Placed This Encounter  Procedures  . Flu vaccine HIGH DOSE PF  . POCT glycosylated hemoglobin (Hb A1C)   Return precautions advised.  Tana Conch, MD

## 2017-08-19 NOTE — Assessment & Plan Note (Signed)
S: controlled on tribenzor alone.  BP Readings from Last 3 Encounters:  08/19/17 128/68  07/05/17 139/61  05/15/17 (!) 122/58  A/P: We discussed blood pressure goal of <140/90. Continue current meds

## 2017-09-17 DIAGNOSIS — H353230 Exudative age-related macular degeneration, bilateral, stage unspecified: Secondary | ICD-10-CM | POA: Diagnosis not present

## 2017-09-17 DIAGNOSIS — H1840 Unspecified corneal degeneration: Secondary | ICD-10-CM | POA: Diagnosis not present

## 2017-09-17 DIAGNOSIS — Z961 Presence of intraocular lens: Secondary | ICD-10-CM | POA: Diagnosis not present

## 2017-09-17 DIAGNOSIS — H1851 Endothelial corneal dystrophy: Secondary | ICD-10-CM | POA: Diagnosis not present

## 2017-10-09 DIAGNOSIS — Z6827 Body mass index (BMI) 27.0-27.9, adult: Secondary | ICD-10-CM | POA: Diagnosis not present

## 2017-10-09 DIAGNOSIS — M545 Low back pain: Secondary | ICD-10-CM | POA: Diagnosis not present

## 2017-10-09 DIAGNOSIS — G894 Chronic pain syndrome: Secondary | ICD-10-CM | POA: Diagnosis not present

## 2017-10-09 DIAGNOSIS — M47896 Other spondylosis, lumbar region: Secondary | ICD-10-CM | POA: Diagnosis not present

## 2017-12-17 ENCOUNTER — Encounter (INDEPENDENT_AMBULATORY_CARE_PROVIDER_SITE_OTHER): Payer: Medicare Other | Admitting: Ophthalmology

## 2017-12-17 DIAGNOSIS — H353132 Nonexudative age-related macular degeneration, bilateral, intermediate dry stage: Secondary | ICD-10-CM

## 2017-12-17 DIAGNOSIS — H43821 Vitreomacular adhesion, right eye: Secondary | ICD-10-CM | POA: Diagnosis not present

## 2017-12-17 DIAGNOSIS — I10 Essential (primary) hypertension: Secondary | ICD-10-CM

## 2017-12-17 DIAGNOSIS — H43813 Vitreous degeneration, bilateral: Secondary | ICD-10-CM

## 2017-12-17 DIAGNOSIS — H35033 Hypertensive retinopathy, bilateral: Secondary | ICD-10-CM | POA: Diagnosis not present

## 2017-12-26 ENCOUNTER — Other Ambulatory Visit: Payer: Self-pay | Admitting: Family Medicine

## 2017-12-29 ENCOUNTER — Telehealth: Payer: Self-pay | Admitting: Family Medicine

## 2017-12-29 NOTE — Telephone Encounter (Unsigned)
Copied from CRM [id number]. Topic: Quick Communication - See Telephone Encounter >> Dec 29, 2017 12:33 PM Cipriano BunkerLambe, Annette S wrote: CRM for notification. See Telephone encounter for:   Pt. Is needed New Pro-patch 2mg  prescription. Please [facility] :  [facility] [place] [address] [place] [id number] Phone: [phone] Fax: [phone]    12/29/17.

## 2017-12-29 NOTE — Telephone Encounter (Signed)
Call to patient- let her know Rx was sent on Friday

## 2017-12-29 NOTE — Telephone Encounter (Signed)
Copied from CRM [id number]. Topic: Quick Communication - See Telephone Encounter >> Dec 29, 2017 12:30 PM Cipriano BunkerLambe, Annette S wrote: CRM for notification. See Telephone encounter for:   Pt. Is needed New Pro-patch 2mg  prescription. Please send to :  [facility] [place] [address] [place] [id number] Phone: [phone] Fax: [phone]   12/29/17.

## 2018-01-08 DIAGNOSIS — Z79899 Other long term (current) drug therapy: Secondary | ICD-10-CM | POA: Diagnosis not present

## 2018-01-08 DIAGNOSIS — M545 Low back pain: Secondary | ICD-10-CM | POA: Diagnosis not present

## 2018-01-08 DIAGNOSIS — M47896 Other spondylosis, lumbar region: Secondary | ICD-10-CM | POA: Diagnosis not present

## 2018-01-08 DIAGNOSIS — G894 Chronic pain syndrome: Secondary | ICD-10-CM | POA: Diagnosis not present

## 2018-01-09 ENCOUNTER — Other Ambulatory Visit: Payer: Self-pay | Admitting: Cardiovascular Disease

## 2018-01-09 DIAGNOSIS — I739 Peripheral vascular disease, unspecified: Secondary | ICD-10-CM

## 2018-01-19 ENCOUNTER — Ambulatory Visit (HOSPITAL_COMMUNITY)
Admission: RE | Admit: 2018-01-19 | Discharge: 2018-01-19 | Disposition: A | Discharge status: Home / Self Care | Payer: Medicare Other | Source: Ambulatory Visit | Attending: Cardiovascular Disease | Admitting: Cardiovascular Disease

## 2018-01-19 DIAGNOSIS — I739 Peripheral vascular disease, unspecified: Secondary | ICD-10-CM | POA: Insufficient documentation

## 2018-01-19 DIAGNOSIS — I70219 Atherosclerosis of native arteries of extremities with intermittent claudication, unspecified extremity: Secondary | ICD-10-CM | POA: Diagnosis not present

## 2018-01-26 ENCOUNTER — Other Ambulatory Visit: Payer: Self-pay | Admitting: Cardiovascular Disease

## 2018-01-26 DIAGNOSIS — I701 Atherosclerosis of renal artery: Secondary | ICD-10-CM

## 2018-01-26 DIAGNOSIS — I70219 Atherosclerosis of native arteries of extremities with intermittent claudication, unspecified extremity: Secondary | ICD-10-CM

## 2018-02-16 ENCOUNTER — Ambulatory Visit: Payer: Medicare Other | Admitting: Family Medicine

## 2018-03-25 ENCOUNTER — Encounter: Payer: Self-pay | Admitting: Family Medicine

## 2018-03-25 ENCOUNTER — Ambulatory Visit (INDEPENDENT_AMBULATORY_CARE_PROVIDER_SITE_OTHER): Payer: Medicare Other | Admitting: Family Medicine

## 2018-03-25 VITALS — BP 118/72 | HR 67 | Temp 97.9°F | Ht 66.0 in | Wt 160.2 lb

## 2018-03-25 DIAGNOSIS — I70219 Atherosclerosis of native arteries of extremities with intermittent claudication, unspecified extremity: Secondary | ICD-10-CM | POA: Diagnosis not present

## 2018-03-25 DIAGNOSIS — G2581 Restless legs syndrome: Secondary | ICD-10-CM | POA: Diagnosis not present

## 2018-03-25 DIAGNOSIS — I701 Atherosclerosis of renal artery: Secondary | ICD-10-CM | POA: Diagnosis not present

## 2018-03-25 DIAGNOSIS — F331 Major depressive disorder, recurrent, moderate: Secondary | ICD-10-CM

## 2018-03-25 DIAGNOSIS — F329 Major depressive disorder, single episode, unspecified: Secondary | ICD-10-CM | POA: Insufficient documentation

## 2018-03-25 DIAGNOSIS — I10 Essential (primary) hypertension: Secondary | ICD-10-CM

## 2018-03-25 DIAGNOSIS — R5383 Other fatigue: Secondary | ICD-10-CM | POA: Diagnosis not present

## 2018-03-25 DIAGNOSIS — R739 Hyperglycemia, unspecified: Secondary | ICD-10-CM

## 2018-03-25 LAB — COMPREHENSIVE METABOLIC PANEL
ALT: 9 U/L (ref 0–35)
AST: 17 U/L (ref 0–37)
Albumin: 3.9 g/dL (ref 3.5–5.2)
Alkaline Phosphatase: 122 U/L — ABNORMAL HIGH (ref 39–117)
BUN: 21 mg/dL (ref 6–23)
CO2: 28 mEq/L (ref 19–32)
Calcium: 9.1 mg/dL (ref 8.4–10.5)
Chloride: 101 mEq/L (ref 96–112)
Creatinine, Ser: 0.68 mg/dL (ref 0.40–1.20)
GFR: 87.49 mL/min (ref >60.00)
Glucose, Bld: 74 mg/dL (ref 70–99)
Potassium: 3.9 mEq/L (ref 3.5–5.1)
Sodium: 137 mEq/L (ref 135–145)
Total Bilirubin: 0.5 mg/dL (ref 0.2–1.2)
Total Protein: 7.1 g/dL (ref 6.0–8.3)

## 2018-03-25 LAB — CBC
HCT: 34 % — ABNORMAL LOW (ref 36.0–46.0)
Hemoglobin: 11.1 g/dL — ABNORMAL LOW (ref 12.0–15.0)
MCHC: 32.8 g/dL (ref 30.0–36.0)
MCV: 87 fl (ref 78.0–100.0)
Platelets: 275 10*3/uL (ref 150.0–400.0)
RBC: 3.91 Mil/uL (ref 3.87–5.11)
RDW: 14 % (ref 11.5–15.5)
WBC: 9.3 10*3/uL (ref 4.0–10.5)

## 2018-03-25 LAB — HEMOGLOBIN A1C: Hgb A1c MFr Bld: 6.4 % (ref 4.6–6.5)

## 2018-03-25 LAB — TSH: TSH: 1 u[IU]/mL (ref 0.35–4.50)

## 2018-03-25 MED ORDER — ROTIGOTINE 2 MG/24HR TD PT24: MEDICATED_PATCH | TRANSDERMAL | 1 refills | Status: DC

## 2018-03-25 NOTE — Assessment & Plan Note (Signed)
S: remains on plavix and aspirin and statin. No history abnormal bleeding. She follows with Dr. Allyson SabalBerry. claudication symptoms are stable A/P: Encouraged patient to continue current medications and keep follow-up with cardiology
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
## 2018-03-25 NOTE — Assessment & Plan Note (Signed)
S: a1c peak at 6.5 later went back to 5.8 on last check despite weight gain.  She has been drinking a fair amount of coke unfortunately-luckily no weight gain this visit Lab Results  Component Value Date   HGBA1C 5.8 08/19/2017  A/P: Update hemoglobin A1c today.  Encouraged her to cut out the Cokes

## 2018-03-25 NOTE — Assessment & Plan Note (Signed)
neupro continues to help with restless legs.  Refill provided
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
## 2018-03-25 NOTE — Patient Instructions (Addendum)
I would also like for you to sign up for an annual wellness visit with one of our nurses, Cassie or Darl PikesSusan, who both specialize in the annual wellness visit. This is a free benefit under medicare that may help us find additional ways to help you. Some highlights are reviewing medications, lifestyle, and doing a dementia screen. Ask them specifically about transportation resources.   Please consider calling for counseling- but I think getting out more may help.   Cut out those cokes to help your sugar!   Discuss shingrix with your pharmacist. It will be cheaper at the pharmacy than here. This can make you sick though.   Please stop by lab before you go

## 2018-03-25 NOTE — Progress Notes (Signed)
Your CBC was largely normal (blood counts, infection fighting cells, platelets). you have very mild anemia which is stable from previous. Your CMET was largely Normal (kidney, liver, and electrolytes, blood sugar).  Your Hemoglobin A1c has jumped back up to 6.4 and is dangerously close to diabetes.  I would recommend we start metformin 500 mg each morning #90 with 3 refills in order to prevent diabetes if she agrees to this.  also would cut Coca-Cola out as we discussed.  Your thyroid was normal.   No obvious cause of fatigue was found

## 2018-04-03 ENCOUNTER — Telehealth: Payer: Self-pay

## 2018-04-03 ENCOUNTER — Other Ambulatory Visit: Payer: Self-pay

## 2018-04-03 MED ORDER — METFORMIN HCL 500 MG PO TABS: ORAL_TABLET | ORAL | 3 refills | Status: DC

## 2018-04-03 NOTE — Telephone Encounter (Signed)
Called patient and gave her, her lab results. Patient was okay with me sending in Metformin. [facility]. Patient verbalized understanding.

## 2018-04-07 DIAGNOSIS — Z79899 Other long term (current) drug therapy: Secondary | ICD-10-CM | POA: Diagnosis not present

## 2018-04-07 DIAGNOSIS — M47896 Other spondylosis, lumbar region: Secondary | ICD-10-CM | POA: Diagnosis not present

## 2018-04-07 DIAGNOSIS — Z79891 Long term (current) use of opiate analgesic: Secondary | ICD-10-CM | POA: Diagnosis not present

## 2018-04-07 DIAGNOSIS — G894 Chronic pain syndrome: Secondary | ICD-10-CM | POA: Diagnosis not present

## 2018-04-07 DIAGNOSIS — M545 Low back pain: Secondary | ICD-10-CM | POA: Diagnosis not present

## 2018-05-01 ENCOUNTER — Ambulatory Visit (INDEPENDENT_AMBULATORY_CARE_PROVIDER_SITE_OTHER): Payer: Medicare Other | Admitting: Cardiovascular Disease

## 2018-05-01 ENCOUNTER — Encounter: Payer: Self-pay | Admitting: Cardiovascular Disease

## 2018-05-01 VITALS — BP 138/68 | HR 69 | Ht 66.0 in | Wt 158.4 lb

## 2018-05-01 DIAGNOSIS — I70219 Atherosclerosis of native arteries of extremities with intermittent claudication, unspecified extremity: Secondary | ICD-10-CM

## 2018-05-01 DIAGNOSIS — I10 Essential (primary) hypertension: Secondary | ICD-10-CM | POA: Diagnosis not present

## 2018-05-01 DIAGNOSIS — I739 Peripheral vascular disease, unspecified: Secondary | ICD-10-CM | POA: Diagnosis not present

## 2018-05-01 DIAGNOSIS — E785 Hyperlipidemia, unspecified: Secondary | ICD-10-CM | POA: Diagnosis not present

## 2018-05-01 DIAGNOSIS — I701 Atherosclerosis of renal artery: Secondary | ICD-10-CM | POA: Diagnosis not present

## 2018-05-01 NOTE — Assessment & Plan Note (Signed)
History of hyperlipidemia on statin therapy. We will recheck a lipid and liver profile 

## 2018-05-01 NOTE — Assessment & Plan Note (Signed)
History of peripheral arterial disease status post right common iliac artery stenting remotely right SFA directional atherectomy and drug-eluting balloon angioplasty 09/04/2015.  Her recent Dopplers suggest that that has remained patent although she does complain of some lifestyle limiting claudication.

## 2018-05-01 NOTE — Patient Instructions (Signed)
Medication Instructions: Your physician recommends that you continue on your current medications as directed. Please refer to the Current Medication list given to you today.  Labwork: Your physician recommends that you return for a FASTING lipid profile and hepatic function panel at your earliest convenience.  Testing/Procedures:  Every 6 months: Your physician has requested that you have a lower extremity arterial duplex. During this test, ultrasound is used to evaluate arterial blood flow in the legs. Allow one hour for this exam. There are no restrictions or special instructions.  Your physician has requested that you have an ankle brachial index (ABI). During this test an ultrasound and blood pressure cuff are used to evaluate the arteries that supply the arms and legs with blood. Allow thirty minutes for this exam. There are no restrictions or special instructions.  Follow-Up: Your physician wants you to follow-up in: 1 year with Dr. Allyson Sabal. You will receive a reminder letter in the mail two months in advance. If you don't receive a letter, please call our office to schedule the follow-up appointment.  If you need a refill on your cardiac medications before your next appointment, please call your pharmacy.

## 2018-05-01 NOTE — Assessment & Plan Note (Signed)
History of essential hypertension with blood pressure measured at 138/68 he is on olmesartan, amlodipine and hydrochlorothiazide.  Continue current meds at current dosing.

## 2018-05-01 NOTE — Progress Notes (Signed)
05/01/2018 Cheryl Atkinson   1931-06-04  [id number]  Primary Physician Durene Cal [name], MD Primary Cardiologist: Runell Gess MD Nich[place], [place]  HPI:  Cheryl Atkinson is a 84 y.o.  requiring podiatry procedure on her right foot. She was sent to me by Dr. Ardelle Anton for claudication and peripheral vascular disease. I last saw her in the office  04/30/2017. I stented her right common iliac artery back in June. She did have occluded SFAs bilaterally. She underwent staged right SFA directional atherectomy and drug-eluting balloon angioplasty on 09/04/15 with subsequent Dopplers that showed significant improvement in her right ABI. She no longer has claudication on that side. She does have a palpable pedal pulse. Her problems include discontinued tobacco abuse, hypertension and hyperlipidemia.since I saw her lastshe has had an episode of substernal chest pain and was evaluated in the emergency room. It was thought secondary to GERD. She has had Doppler studies of her lower extremities that show a decline in her right ABI with the development of a high-frequency signal in her mid right SFA although she says her back pain limits her more than her legs at this time. Since I saw her a year ago she's remained clinically stable. She denies chest pain does get some shortness of breath especially when singing in church. She does have COPD. Since I saw her a year ago she is remained stable.  She does complain of some lifestyle limiting claudication.  Recent Doppler suggested that her stents and atherectomy site have remained patent.  She was also recently diagnosed with non-insulin-requiring diabetes and was placed on metformin by her PCP.    Current Meds  Medication Sig  . aspirin EC 81 MG EC tablet Take 1 tablet (81 mg total) by mouth daily.  Marland Kitchen atorvastatin (LIPITOR) 40 MG tablet Take 1 tablet (40 mg total) by mouth daily at 6 PM.  . clopidogrel (PLAVIX) 75 MG tablet Take 1 tablet (75 mg total)  by mouth daily with breakfast.  . CONZIP 300 MG CP24 Take 300 mg by mouth every morning.  . metFORMIN (GLUCOPHAGE) 500 MG tablet TAKE 1 (1) TABLET BY MOUTH DAILY  . Multiple Vitamins-Minerals (PRESERVISION AREDS 2 PO) Take 1 tablet by mouth 2 (two) times daily.  . Olmesartan-Amlodipine-HCTZ (TRIBENZOR) 40-10-12.5 MG TABS Take 1 tablet by mouth every morning.  . polyethylene glycol (MIRALAX / GLYCOLAX) packet Take 17 g by mouth at bedtime as needed for mild constipation. Reported on 03/14/2016  . rotigotine (NEUPRO) 2 MG/24HR APPLY 1 PATCH TO SKIN DAILY  . traMADol (ULTRAM) 50 MG tablet Take 50 mg by mouth at bedtime as needed for moderate pain.  . traMADol (ULTRAM-ER) 300 MG 24 hr tablet Take 300 mg by mouth daily.     Allergies  Allergen Reactions  . Codeine Other (See Comments)    REACTION: Syncope     Social History   Socioeconomic History  . Marital status: Widowed    Spouse name: Not on file  . Number of children: Not on file  . Years of education: Not on file  . Highest education level: Not on file  Occupational History  . Not on file  Social Needs  . Financial resource strain: Not on file  . Food insecurity:    Worry: Not on file    Inability: Not on file  . Transportation needs:    Medical: Not on file    Non-medical: Not on file  Tobacco Use  . Smoking  status: Former Smoker    Packs/day: 0.50    Years: 60.00    Pack years: 30.00    Types: Cigarettes    Last attempt to quit: 04/02/2015    Years since quitting: 3.0  . Smokeless tobacco: Never Used  Substance and Sexual Activity  . Alcohol use: No  . Drug use: No  . Sexual activity: Never  Lifestyle  . Physical activity:    Days per week: Not on file    Minutes per session: Not on file  . Stress: Not on file  Relationships  . Social connections:    Talks on phone: Not on file    Gets together: Not on file    Attends religious service: Not on file    Active member of club or organization: Not on file     Attends meetings of clubs or organizations: Not on file    Relationship status: Not on file  . Intimate partner violence:    Fear of current or ex partner: Not on file    Emotionally abused: Not on file    Physically abused: Not on file    Forced sexual activity: Not on file  Other Topics Concern  . Not on file  Social History Narrative   Widowed 2013. 2 sons. 1 grandchild.    Son can help on weekends. Sister and sister in law could help as well. Thinks she may stop driving in next few years.       Retired from [employer].       Hobbies: time at home and with family      HCPOA: sister-in-law and brother. Deirdre Evener.       DNR/DNI      Regular exercise: none   Caffeine use: cup of coffee      Review of Systems: General: negative for chills, fever, night sweats or weight changes.  Cardiovascular: negative for chest pain, dyspnea on exertion, edema, orthopnea, palpitations, paroxysmal nocturnal dyspnea or shortness of breath Dermatological: negative for rash Respiratory: negative for cough or wheezing Urologic: negative for hematuria Abdominal: negative for nausea, vomiting, diarrhea, bright red blood per rectum, melena, or hematemesis Neurologic: negative for visual changes, syncope, or dizziness All other systems reviewed and are otherwise negative except as noted above.    Blood pressure 138/68, pulse 69, height  (1.676 m), weight 158 lb 6.4 oz (71.8 kg).  General appearance: alert and no distress Neck: no adenopathy, no carotid bruit, no JVD, supple, symmetrical, trachea midline and thyroid not enlarged, symmetric, no tenderness/mass/nodules Lungs: clear to auscultation bilaterally Heart: regular rate and rhythm, S1, S2 normal, no murmur, click, rub or gallop Extremities: extremities normal, atraumatic, no cyanosis or edema Pulses: 2+ and symmetric Skin: Skin color, texture, turgor normal. No rashes or lesions Neurologic: Alert and oriented X 3, normal  strength and tone. Normal symmetric reflexes. Normal coordination and gait  EKG sinus rhythm at 69 with septal Q waves and left axis deviation.  I personally reviewed this EKG.  ASSESSMENT AND PLAN:   Essential hypertension History of essential hypertension with blood pressure measured at 138/68 he is on olmesartan, amlodipine and hydrochlorothiazide.  Continue current meds at current dosing.  Claudication [facility]) History of peripheral arterial disease status post right common iliac artery stenting remotely right SFA directional atherectomy and drug-eluting balloon angioplasty 09/04/2015.  Her recent Dopplers suggest that that has remained patent although she does complain of some lifestyle limiting claudication.  Hyperlipidemia History of hyperlipidemia on statin therapy.  We will recheck a lipid and liver profile.       Runell Gess MD FACP,FACC,FAHA, [facility] 05/01/2018 9:15 AM

## 2018-05-06 DIAGNOSIS — E785 Hyperlipidemia, unspecified: Secondary | ICD-10-CM | POA: Diagnosis not present

## 2018-05-06 LAB — HEPATIC FUNCTION PANEL
ALT: 15 IU/L (ref 0–32)
AST: 20 IU/L (ref 0–40)
Albumin: 4.4 g/dL (ref 3.5–4.7)
Alkaline Phosphatase: 133 IU/L — ABNORMAL HIGH (ref 39–117)
Bilirubin Total: 0.5 mg/dL (ref 0.0–1.2)
Bilirubin, Direct: 0.17 mg/dL (ref 0.00–0.40)
Total Protein: 7.3 g/dL (ref 6.0–8.5)

## 2018-05-06 LAB — LIPID PANEL
Chol/HDL Ratio: 1.9 ratio (ref 0.0–4.4)
Cholesterol, Total: 179 mg/dL (ref 100–199)
HDL: 92 mg/dL (ref >39)
LDL Calculated: 73 mg/dL (ref 0–99)
Triglycerides: 68 mg/dL (ref 0–149)
VLDL Cholesterol Cal: 14 mg/dL (ref 5–40)

## 2018-05-12 ENCOUNTER — Inpatient Hospital Stay (HOSPITAL_COMMUNITY): Admission: RE | Admit: 2018-05-12 | Payer: Medicare Other | Source: Ambulatory Visit

## 2018-05-12 ENCOUNTER — Ambulatory Visit (HOSPITAL_COMMUNITY)
Admission: RE | Admit: 2018-05-12 | Discharge: 2018-05-12 | Disposition: A | Discharge status: Home / Self Care | Payer: Medicare Other | Source: Ambulatory Visit | Attending: Cardiovascular Disease | Admitting: Cardiovascular Disease

## 2018-05-12 DIAGNOSIS — I701 Atherosclerosis of renal artery: Secondary | ICD-10-CM | POA: Diagnosis not present

## 2018-05-15 ENCOUNTER — Other Ambulatory Visit: Payer: Self-pay | Admitting: *Deleted

## 2018-05-15 DIAGNOSIS — R748 Abnormal levels of other serum enzymes: Secondary | ICD-10-CM

## 2018-05-17 DIAGNOSIS — H43823 Vitreomacular adhesion, bilateral: Secondary | ICD-10-CM | POA: Diagnosis not present

## 2018-05-25 DIAGNOSIS — R748 Abnormal levels of other serum enzymes: Secondary | ICD-10-CM | POA: Diagnosis not present

## 2018-05-25 LAB — HEPATIC FUNCTION PANEL
ALT: 10 IU/L (ref 0–32)
AST: 19 IU/L (ref 0–40)
Albumin: 4.2 g/dL (ref 3.5–4.7)
Alkaline Phosphatase: 112 IU/L (ref 39–117)
Bilirubin Total: 0.4 mg/dL (ref 0.0–1.2)
Bilirubin, Direct: 0.16 mg/dL (ref 0.00–0.40)
Total Protein: 6.9 g/dL (ref 6.0–8.5)

## 2018-06-12 ENCOUNTER — Other Ambulatory Visit: Payer: Self-pay | Admitting: Cardiovascular Disease

## 2018-06-12 MED ORDER — CLOPIDOGREL BISULFATE 75 MG PO TABS: 75.0000 mg | ORAL_TABLET | Freq: Every day | ORAL | 3 refills | Status: DC

## 2018-06-12 MED ORDER — ATORVASTATIN CALCIUM 40 MG PO TABS: 40.0000 mg | ORAL_TABLET | Freq: Every day | ORAL | 3 refills | Status: DC

## 2018-06-12 NOTE — Telephone Encounter (Signed)
Patient of Dr. Allyson SabalBerry walked in requesting refills of plavix & [facility] Rx(s) sent to pharmacy electronically.

## 2018-06-25 ENCOUNTER — Ambulatory Visit (INDEPENDENT_AMBULATORY_CARE_PROVIDER_SITE_OTHER): Payer: Medicare Other | Admitting: Family Medicine

## 2018-06-25 ENCOUNTER — Encounter: Payer: Self-pay | Admitting: Family Medicine

## 2018-06-25 VITALS — BP 146/76 | HR 72 | Temp 98.6°F | Ht 66.0 in | Wt 155.2 lb

## 2018-06-25 DIAGNOSIS — R739 Hyperglycemia, unspecified: Secondary | ICD-10-CM | POA: Diagnosis not present

## 2018-06-25 DIAGNOSIS — I10 Essential (primary) hypertension: Secondary | ICD-10-CM | POA: Diagnosis not present

## 2018-06-25 DIAGNOSIS — E8881 Metabolic syndrome: Secondary | ICD-10-CM | POA: Diagnosis not present

## 2018-06-25 DIAGNOSIS — F331 Major depressive disorder, recurrent, moderate: Secondary | ICD-10-CM | POA: Diagnosis not present

## 2018-06-25 DIAGNOSIS — I701 Atherosclerosis of renal artery: Secondary | ICD-10-CM | POA: Diagnosis not present

## 2018-06-25 DIAGNOSIS — H8110 Benign paroxysmal vertigo, unspecified ear: Secondary | ICD-10-CM

## 2018-06-25 LAB — POCT GLYCOSYLATED HEMOGLOBIN (HGB A1C): Hemoglobin A1C: 5.7 % — AB (ref 4.0–5.6)

## 2018-06-25 MED ORDER — OLMESARTAN-AMLODIPINE-HCTZ 40-10-12.5 MG PO TABS: 1.0000 | ORAL_TABLET | ORAL | 3 refills | Status: DC

## 2018-06-25 NOTE — Assessment & Plan Note (Signed)
S: loneliness really bothers her. Feels out of place even when she does go out. Doesn't want to live another 5 years but no thoughts of self harm. Transport hard to get to counseling and interest on her part is low. Interest to get out in general low due to depression.  [facility] 03/25/2018 05/15/2017 03/14/2016  Decreased Interest 3 1 0  Down, Depressed, Hopeless 2 1 1   PHQ - 2 Score 5 2 1   Altered sleeping 3 0 1  Tired, decreased energy 3 3 2   Change in appetite 3 0 2  Feeling bad or failure about yourself  0 0 3  Trouble concentrating 0 0 3  Moving slowly or fidgety/restless 0 0 0  Suicidal thoughts 0 0 1  PHQ-9 Score 14 5 13   Difficult doing work/chores Somewhat difficult Not difficult at all -  A/P: she declines intervention but agrees to 1 month follow up From avs "I want you to think over options to  1. Increase your socializing 2. Help you feel less out of place (counseling) because you are certainly not out of place! You are a joy to be around 3. Consider medications 4. Consider living in a community that would promote interaction  I would love if you would do a wellness visit between now and next visit" [facility]- possibly could find community resources

## 2018-06-25 NOTE — Assessment & Plan Note (Signed)
S:  Poorly controlled on tribenzor 40-10-12.5mg  (only hctz not at max dose) BP Readings from Last 3 Encounters:  06/25/18 (!) 146/76  05/01/18 138/68  03/25/18 118/72  A/P: We discussed blood pressure goal of <140/90. BP trending up lately- 1 month repeat and no coffee right before- if remains high possibly max out hctz or consider beta blocker

## 2018-06-25 NOTE — Progress Notes (Signed)
Subjective:  Cheryl ReeseBetty L Atkinson is a 84 y.o. year old very pleasant female patient who presents for/with See problem oriented charting ROS- depressed mood. No SI. No chest pain. No increased edema   Past Medical History-  Patient Active Problem List   Diagnosis Date Noted  . Memory loss 04/11/2016    Priority: High  . Renal artery stenosis (HCC) 09/27/2015    Priority: High  . Claudication (HCC) 05/04/2015    Priority: High  . Atherosclerotic PVD with intermittent claudication (HCC) 04/26/2015    Priority: High  . DNR (do not resuscitate) 09/29/2014    Priority: High  . Insulin resistance 07/19/2014    Priority: High  . LOW BACK PAIN 09/25/2007    Priority: High  . Major depression 03/25/2018    Priority: Medium  . BPPV (benign paroxysmal positional vertigo) 07/12/2016    Priority: Medium  . Hyperlipidemia 06/30/2015    Priority: Medium  . Former smoker 09/29/2014    Priority: Medium  . COPD (chronic obstructive pulmonary disease) (HCC) 09/20/2009    Priority: Medium  . Iron deficiency anemia 11/09/2007    Priority: Medium  . RESTLESS LEG SYNDROME 09/25/2007    Priority: Medium  . Essential hypertension 09/25/2007    Priority: Medium  . GERD 09/25/2007    Priority: Medium  . Osteoporosis 09/25/2007    Priority: Medium  . Cat bite of right hand 12/21/2016    Priority: Low  . Mallet toe of right foot 10/20/2015    Priority: Low  . Tinnitus 12/31/2014    Priority: Low  . Multinodular goiter 08/30/2013    Priority: Low  . Spinal stenosis of lumbar region at multiple levels 09/29/2012    Priority: Low  . HIP PAIN, BILATERAL 07/16/2010    Priority: Low  . CONSTIPATION, CHRONIC 09/20/2009    Priority: Low  . History of UTI 11/09/2007    Priority: Low    Medications- reviewed and updated Current Outpatient Medications  Medication Sig Dispense Refill  . aspirin EC 81 MG EC tablet Take 1 tablet (81 mg total) by mouth daily.    Marland Kitchen. atorvastatin (LIPITOR) 40 MG tablet  Take 1 tablet (40 mg total) by mouth daily at 6 PM. 90 tablet 3  . clopidogrel (PLAVIX) 75 MG tablet Take 1 tablet (75 mg total) by mouth daily with breakfast. 90 tablet 3  . CONZIP 300 MG CP24 Take 300 mg by mouth every morning.    . metFORMIN (GLUCOPHAGE) 500 MG tablet TAKE 1 (1) TABLET BY MOUTH DAILY 90 tablet 3  . Multiple Vitamins-Minerals (PRESERVISION AREDS 2 PO) Take 1 tablet by mouth 2 (two) times daily.    . Olmesartan-amLODIPine-HCTZ (TRIBENZOR) 40-10-12.5 MG TABS Take 1 tablet by mouth every morning. 90 tablet 3  . polyethylene glycol (MIRALAX / GLYCOLAX) packet Take 17 g by mouth at bedtime as needed for mild constipation. Reported on 03/14/2016    . rotigotine (NEUPRO) 2 MG/24HR APPLY 1 PATCH TO SKIN DAILY 90 patch 1  . traMADol (ULTRAM) 50 MG tablet Take 50 mg by mouth at bedtime as needed for moderate pain.    . traMADol (ULTRAM-ER) 300 MG 24 hr tablet Take 300 mg by mouth daily.     No current facility-administered medications for this visit.     Objective: BP (!) 146/76 (BP Location: Left Arm, Patient Position: Sitting, Cuff Size: Large)   Pulse 72   Temp 98.6 F (37 C) (Oral)   Ht 5\' 6"  (1.676 m)   Wt 155  lb 3.2 oz (70.4 kg)   SpO2 96%   BMI 25.05 kg/m  Gen: NAD, resting comfortably CV: RRR no murmurs rubs or gallops Lungs: CTAB no crackles, wheeze, rhonchi Abdomen: soft/nontender/nondistended/normal bowel sounds.  Skin: warm, dry Depressed mood  Assessment/Plan:  BPPV (benign paroxysmal positional vertigo) Has had episodes with turning head or turning over in bed. Has not used dramamine recently. She wants to try dramamine first but if this isnt effective agrees to go to vestibular rehab  Insulin resistance S:  controlled on metformin 500mg  daily Exercise and diet- weight down 5 lbs from last visit. Had advised cutting coke. Eats less overall but did not cut her coke out.  Lab Results  Component Value Date   HGBA1C 5.7 (A) 06/25/2018   HGBA1C 6.4  03/25/2018   HGBA1C 5.8 08/19/2017   A/P: much improved a1c! continue current rx- still encouraged her to cut the coke! We discussed doesn't have to check CBGs unless low- we will need to make sure to show her how to use her deceased husbands meter or get her new one next visit- had intended to discuss today  Essential hypertension S:  Poorly controlled on tribenzor 40-10-12.5mg  (only hctz not at max dose) BP Readings from Last 3 Encounters:  06/25/18 (!) 146/76  05/01/18 138/68  03/25/18 118/72  A/P: We discussed blood pressure goal of <140/90. BP trending up lately- 1 month repeat and no coffee right before- if remains high possibly max out hctz or consider beta blocker  Major depression S: loneliness really bothers her. Feels out of place even when she does go out. Doesn't want to live another 5 years but no thoughts of self harm. Transport hard to get to counseling and interest on her part is low. Interest to get out in general low due to depression.  Depression screen [facility] 2/9 03/25/2018 05/15/2017 03/14/2016  Decreased Interest 3 1 0  Down, Depressed, Hopeless 2 1 1   PHQ - 2 Score 5 2 1   Altered sleeping 3 0 1  Tired, decreased energy 3 3 2   Change in appetite 3 0 2  Feeling bad or failure about yourself  0 0 3  Trouble concentrating 0 0 3  Moving slowly or fidgety/restless 0 0 0  Suicidal thoughts 0 0 1  PHQ-9 Score 14 5 13   Difficult doing work/chores Somewhat difficult Not difficult at all -  A/P: she declines intervention but agrees to 1 month follow up From avs "I want you to think over options to  1. Increase your socializing 2. Help you feel less out of place (counseling) because you are certainly not out of place! You are a joy to be around 3. Consider medications 4. Consider living in a community that would promote interaction  I would love if you would do a wellness visit between now and next visit" [facility]- possibly could find community resources   Future  Appointments  Date Time Provider Department Center  07/07/2018  3:00 PM [facility]  07/30/2018  1:45 PM Shelva Majestic, MD [facility]   Lab/Order associations: Hyperglycemia - Plan: POCT glycosylated hemoglobin (Hb A1C)  Meds ordered this encounter  Medications  . Olmesartan-amLODIPine-HCTZ (TRIBENZOR) 40-10-12.5 MG TABS    Sig: Take 1 tablet by mouth every morning.    Dispense:  90 tablet    Refill:  3    Return precautions advised.  Tana Conch, MD

## 2018-06-25 NOTE — Assessment & Plan Note (Signed)
Has had episodes with turning head or turning over in bed. Has not used dramamine recently. She wants to try dramamine first but if this isnt effective agrees to go to vestibular rehab

## 2018-06-25 NOTE — Patient Instructions (Addendum)
See me back in 1 month. Try to get an afternoon appointment. We will recheck your blood pressure at that time. May need to make adjustments if pressure remains high.   I want you to think over options to  1. Increase your socializing 2. Help you feel less out of place (counseling) because you are certainly not out of place! You are a joy to be around 3. Consider medications 4. Consider living in a community that would promote interaction  I would love if you would do a wellness visit between now and next visit

## 2018-06-25 NOTE — Assessment & Plan Note (Signed)
S:  controlled on metformin 500mg  daily Exercise and diet- weight down 5 lbs from last visit. Had advised cutting coke. Eats less overall but did not cut her coke out.  Lab Results  Component Value Date   HGBA1C 5.7 (A) 06/25/2018   HGBA1C 6.4 03/25/2018   HGBA1C 5.8 08/19/2017   A/P: much improved a1c! continue current rx- still encouraged her to cut the coke! We discussed doesn't have to check CBGs unless low- we will need to make sure to show her how to use her deceased husbands meter or get her new one next visit- had intended to discuss today

## 2018-07-06 DIAGNOSIS — M545 Low back pain: Secondary | ICD-10-CM | POA: Diagnosis not present

## 2018-07-06 DIAGNOSIS — M5136 Other intervertebral disc degeneration, lumbar region: Secondary | ICD-10-CM | POA: Diagnosis not present

## 2018-07-06 DIAGNOSIS — G894 Chronic pain syndrome: Secondary | ICD-10-CM | POA: Diagnosis not present

## 2018-07-07 ENCOUNTER — Ambulatory Visit (INDEPENDENT_AMBULATORY_CARE_PROVIDER_SITE_OTHER): Payer: Medicare Other

## 2018-07-07 VITALS — BP 146/70 | HR 65 | Ht 66.0 in | Wt 156.6 lb

## 2018-07-07 DIAGNOSIS — Z0000 Encounter for general adult medical examination without abnormal findings: Secondary | ICD-10-CM | POA: Diagnosis not present

## 2018-07-07 NOTE — Patient Instructions (Signed)
Cheryl Atkinson , Thank you for taking time to come for your Medicare Wellness Visit. I appreciate your ongoing commitment to your health goals. Please review the following plan we discussed and let me know if I can assist you in the future.   These are the goals we discussed: Goals    . Increase physical activity     Patient would like to be able to walk more. We discussed her accessing the [employer] Sneakers program at her local [employer]. She has an interest in pursuing this       This is a list of the screening recommended for you and due dates:  Health Maintenance  Topic Date Due  . Flu Shot  07/02/2018  . Tetanus Vaccine  11/28/2023  . DEXA scan (bone density measurement)  Completed  . Pneumonia vaccines  Completed   Preventive Care for Adults  A healthy lifestyle and preventive care can promote health and wellness. Preventive health guidelines for adults include the following key practices.  . A routine yearly physical is a good way to check with your health care provider about your health and preventive screening. It is a chance to share any concerns and updates on your health and to receive a thorough exam.  . Visit your dentist for a routine exam and preventive care every 6 months. Brush your teeth twice a day and floss once a day. Good oral hygiene prevents tooth decay and gum disease.  . The frequency of eye exams is based on your age, health, family medical history, use  of contact lenses, and other factors. Follow your health care provider's recommendations for frequency of eye exams.  . Eat a healthy diet. Foods like vegetables, fruits, whole grains, low-fat dairy products, and lean protein foods contain the nutrients you need without too many calories. Decrease your intake of foods high in solid fats, added sugars, and salt. Eat the right amount of calories for you. Get information about a proper diet from your health care provider, if necessary.  . Regular physical exercise is  one of the most important things you can do for your health. Most adults should get at least 150 minutes of moderate-intensity exercise (any activity that increases your heart rate and causes you to sweat) each week. In addition, most adults need muscle-strengthening exercises on 2 or more days a week.  Silver Sneakers may be a benefit available to you. To determine eligibility, you may visit the website: www.silversneakers.com or contact program at [phone] Mon-Fri between 8AM-8PM.   . Maintain a healthy weight. The body mass index (BMI) is a screening tool to identify possible weight problems. It provides an estimate of body fat based on height and weight. Your health care provider can find your BMI and can help you achieve or maintain a healthy weight.   For adults 20 years and older: ? A BMI below 18.5 is considered underweight. ? A BMI of 18.5 to 24.9 is normal. ? A BMI of 25 to 29.9 is considered overweight. ? A BMI of 30 and above is considered obese.   . Maintain normal blood lipids and cholesterol levels by exercising and minimizing your intake of saturated fat. Eat a balanced diet with plenty of fruit and vegetables. Blood tests for lipids and cholesterol should begin at age 51 and be repeated every 5 years. If your lipid or cholesterol levels are high, you are over 50, or you are at high risk for heart disease, you may need your cholesterol levels  checked more frequently. Ongoing high lipid and cholesterol levels should be treated with medicines if diet and exercise are not working.  . If you smoke, find out from your health care provider how to quit. If you do not use tobacco, please do not start.  . If you choose to drink alcohol, please do not consume more than 2 drinks per day. One drink is considered to be 12 ounces (355 mL) of beer, 5 ounces (148 mL) of wine, or 1.5 ounces (44 mL) of liquor.  . If you are 3355-351 years old, ask your health care provider if you should take  aspirin to prevent strokes.  . Use sunscreen. Apply sunscreen liberally and repeatedly throughout the day. You should seek shade when your shadow is shorter than you. Protect yourself by wearing long sleeves, pants, a wide-brimmed hat, and sunglasses year round, whenever you are outdoors.  . Once a month, do a whole body skin exam, using a mirror to look at the skin on your back. Tell your health care provider of new moles, moles that have irregular borders, moles that are larger than a pencil eraser, or moles that have changed in shape or color.

## 2018-07-07 NOTE — Progress Notes (Signed)
I have reviewed and agree with note, evaluation, plan. Bp elevated 2 consecutive visits- we may need to make adjustment at follow up if remains high BP Readings from Last 3 Encounters:  07/07/18 (!) 146/70  06/25/18 (!) 146/76  05/01/18 138/68   I really appreciate the support to help patient get more involved socially.   Tana ConchStephen Asako Saliba, MD

## 2018-07-07 NOTE — Progress Notes (Signed)
PCP notes: See me back in 1 month. Try to get an afternoon appointment. We will recheck your blood pressure at that time. May need to make adjustments if pressure remains high.   I want you to think over options to  1. Increase your socializing 2. Help you feel less out of place (counseling) because you are certainly not out of place! You are a joy to be around 3. Consider medications 4. Consider living in a community that would promote interaction     Health maintenance: Please schedule flu shot this Fall   Abnormal screenings: PHQ-9 score of 7 today.    Patient concerns: Moles on the back of head, will address at visit scheduled at the end of the month.  Offered to schedule an appointment with Dr. Durene CalHunter sooner but she declined   Nurse concerns: Blood pressure 146/70. Similar to last visit. Patient stated she is due her medication at 6:00 tonight. Advised her to follow up with PCP if remains elevated   Next PCP appt: 07/30/2018

## 2018-07-07 NOTE — Progress Notes (Signed)
Subjective:   Cheryl Atkinson is a 84 y.o. female who presents for Medicare Annual (Subsequent) preventive examination.  Review of Systems:  No ROS.  Medicare Wellness Visit. Additional risk factors are reflected in the social history. Cardiac Risk Factors include: advanced age (>52men, >34 women) Patient currently lives in a single story home with son. She is a widow. She has 2 dogs and 3 cats. She has a Programmer, applications who comes every other week to help with cleaning. Patient has a life alert necklace she wears.  Goes to bed after 7:00pm. Gets up 2-3 times at night to go to the bathroom. Gets up at 7:30am. Feels rested most of the time when she gets up in the morning.    Objective:     Vitals: BP (!) 146/70 (BP Location: Left Arm, Patient Position: Sitting, Cuff Size: Large)   Pulse 65   Ht 5\' 6"  (1.676 m)   Wt 156 lb 9.6 oz (71 kg)   SpO2 96%   BMI 25.28 kg/m   Body mass index is 25.28 kg/m.  Advanced Directives 07/07/2018 07/05/2017 05/15/2017 12/21/2016 04/29/2016 09/04/2015 05/04/2015  Does Patient Have a Medical Advance Directive? Yes Yes Yes No No Yes Yes  Type of Diplomatic Services operational officer Living will Healthcare Power of [place];Living will - - Public affairs consultant of [employer] of [place];Living will  Does patient want to make changes to medical advance directive? No - Patient declined No - Patient declined - - - - No - Patient declined  Copy of Healthcare Power of Attorney in Chart? No - copy requested - No - copy requested - - Yes No - copy requested  Would patient like information on creating a medical advance directive? - - - No - Patient declined No - patient declined information - -  Pre-existing out of facility DNR order (yellow form or pink MOST form) - - - - - - -    Tobacco Social History   Tobacco Use  Smoking Status Former Smoker  . Packs/day: 0.50  . Years: 60.00  . Pack years: 30.00  . Types: Cigarettes  . Last attempt to quit:  04/02/2015  . Years since quitting: 3.2  Smokeless Tobacco Never Used     Counseling given: Not Answered    Past Medical History:  Diagnosis Date  . Anemia   . Arthritis    "shoulders" (05/04/2015)  . Cellulitis of right lower extremity 11/27/2013  . Chronic lower back pain   . Constipation   . GERD (gastroesophageal reflux disease)   . History of hiatal hernia   . Hypertension   . Osteoporosis   . Peripheral arterial disease (HCC)   . Restless leg syndrome   . Thyroid disease    Past Surgical History:  Procedure Laterality Date  . ABDOMINAL AORTAGRAM  05/04/2015   Procedure: Abdominal Aortagram;  Surgeon: Runell Gess, MD;  Location: [facility] INVASIVE CV LAB;  Service: Cardiovascular;;  . APPENDECTOMY    . CATARACT EXTRACTION W/ INTRAOCULAR LENS  IMPLANT, BILATERAL Bilateral   . I&D EXTREMITY Right 12/22/2016   Procedure: IRRIGATION AND DEBRIDEMENT EXTREMITY;  Surgeon: Mack Hook, MD;  Location: [facility] OR;  Service: Orthopedics;  Laterality: Right;  . PERIPHERAL VASCULAR CATHETERIZATION N/A 05/04/2015   Procedure: Lower Extremity Angiography;  Surgeon: Runell Gess, MD;  Location: [facility] INVASIVE CV LAB;  Service: Cardiovascular;  Laterality: N/A;  . PERIPHERAL VASCULAR CATHETERIZATION  05/04/2015   Procedure: Peripheral Vascular Intervention;  Surgeon: Christiane Ha  Erlene Quan, MD;  Location: [facility] INVASIVE CV LAB;  Service: Cardiovascular;;  RCIA - 7x22 ICAST  . PERIPHERAL VASCULAR CATHETERIZATION Right 09/04/2015   Procedure: Peripheral Vascular Atherectomy;  Surgeon: Runell Gess, MD;  Location: [facility] INVASIVE CV LAB;  Service: Cardiovascular;  Laterality: Right;  SFA  . sfa Right 09/04/2015   de balloon  . THYROID SURGERY Right ?2013   "had goiter taken off my neck"  . TONSILLECTOMY    . VAGINAL HYSTERECTOMY     Family History  Problem Relation Age of Onset  . Heart disease Father   . Heart attack Father   . Heart disease Mother   . Coronary artery disease Unknown   . Atrial  fibrillation Sister   . Stroke Maternal Grandmother   . Cancer Paternal Grandmother    Social History   Socioeconomic History  . Marital status: Widowed    Spouse name: Not on file  . Number of children: Not on file  . Years of education: Not on file  . Highest education level: Not on file  Occupational History  . Not on file  Social Needs  . Financial resource strain: Not on file  . Food insecurity:    Worry: Not on file    Inability: Not on file  . Transportation needs:    Medical: Not on file    Non-medical: Not on file  Tobacco Use  . Smoking status: Former Smoker    Packs/day: 0.50    Years: 60.00    Pack years: 30.00    Types: Cigarettes    Last attempt to quit: 04/02/2015    Years since quitting: 3.2  . Smokeless tobacco: Never Used  Substance and Sexual Activity  . Alcohol use: No  . Drug use: No  . Sexual activity: Not Currently  Lifestyle  . Physical activity:    Days per week: Not on file    Minutes per session: Not on file  . Stress: Not on file  Relationships  . Social connections:    Talks on phone: Not on file    Gets together: Not on file    Attends religious service: Not on file    Active member of club or organization: Not on file    Attends meetings of clubs or organizations: Not on file    Relationship status: Not on file  Other Topics Concern  . Not on file  Social History Narrative   Widowed 2013. 2 sons. 1 grandchild.    Son can help on weekends. Sister and sister in law could help as well. Thinks she may stop driving in next few years.       Retired from [employer].       Hobbies: time at home and with family      HCPOA: sister-in-law and brother. Deirdre Evener.       DNR/DNI      Regular exercise: none   Caffeine use: cup of coffee     Outpatient Encounter Medications as of 07/07/2018  Medication Sig  . aspirin EC 81 MG EC tablet Take 1 tablet (81 mg total) by mouth daily.  Marland Kitchen atorvastatin (LIPITOR) 40 MG tablet Take 1 tablet  (40 mg total) by mouth daily at 6 PM.  . clopidogrel (PLAVIX) 75 MG tablet Take 1 tablet (75 mg total) by mouth daily with breakfast.  . CONZIP 300 MG CP24 Take 300 mg by mouth every morning.  . metFORMIN (GLUCOPHAGE) 500 MG tablet TAKE 1 (1) TABLET BY  MOUTH DAILY  . Multiple Vitamins-Minerals (PRESERVISION AREDS 2 PO) Take 1 tablet by mouth 2 (two) times daily.  . Olmesartan-amLODIPine-HCTZ (TRIBENZOR) 40-10-12.5 MG TABS Take 1 tablet by mouth every morning.  . polyethylene glycol (MIRALAX / GLYCOLAX) packet Take 17 g by mouth at bedtime as needed for mild constipation. Reported on 03/14/2016  . rotigotine (NEUPRO) 2 MG/24HR APPLY 1 PATCH TO SKIN DAILY  . traMADol (ULTRAM) 50 MG tablet Take 50 mg by mouth at bedtime as needed for moderate pain.  . traMADol (ULTRAM-ER) 300 MG 24 hr tablet Take 300 mg by mouth daily.   No facility-administered encounter medications on file as of 07/07/2018.     Activities of Daily Living In your present state of health, do you have any difficulty performing the following activities: 07/07/2018  Hearing? N  Vision? N  Difficulty concentrating or making decisions? N  Walking or climbing stairs? Y  Comment Walks with a rolator walker  Dressing or bathing? N  Doing errands, shopping? N  Preparing Food and eating ? N  Using the Toilet? N  In the past six months, have you accidently leaked urine? Y  Comment Urinary Incontinanence  Do you have problems with loss of bowel control? N  Managing your Medications? N  Managing your Finances? N  Housekeeping or managing your Housekeeping? Y  Comment Has a house Keeper who comes in every other week  Some recent data might be hidden    Patient Care Team: Shelva MajesticHunter, Stephen O, MD as PCP - General (Family Medicine) Runell GessBerry, Jonathan J, MD as Consulting Physician (Cardiology) Letta KocherSaullo, Thomas, MD as Consulting Physician (Rehabilitation) Sherrie GeorgeMatthews, John D, MD as Consulting Physician (Ophthalmology)    Assessment:   This is a  routine wellness examination for Cheryl RhodesBetty.  Exercise Activities and Dietary recommendations Current Exercise Habits: The patient does not participate in regular exercise at present, Exercise limited by: orthopedic condition(s)  Breakfast: bowl of cereal, coffee (decaffinated) with cream  Lunch: fried egg sandwich, coke  Dinner:Fried okra, and corn bread, corn on the cob, hamburger, drank coke with  Drinks very little water a day Goals    . Increase physical activity     Patient would like to be able to walk more. We discussed her accessing the [employer] program at her local [employer]. She has an interest in pursuing this       Fall Risk Fall Risk  07/07/2018 06/25/2018 06/23/2017 05/15/2017 03/14/2016  Falls in the past year? Yes Yes Yes Yes Yes  Comment - - Emmi Telephone Survey: data to providers prior to load - -  Number falls in past yr: 2 or more 1 1 1 1   Comment - - Emmi Telephone Survey Actual Response = 1 - -  Injury with Fall? Yes Yes No No No  Comment Bruising - - - -  Risk for fall due to : Impaired balance/gait Impaired balance/gait - Impaired balance/gait Impaired balance/gait  Risk for fall due to: Comment Walks wiht a rolator Uses a Rolator - - -  Follow up - - - - Education provided    Depression Screen PHQ 2/9 Scores 07/07/2018 03/25/2018 05/15/2017 03/14/2016  PHQ - 2 Score 0 5 2 1   PHQ- 9 Score 7 14 5 13      Cognitive Function MMSE - Mini Mental State Exam 05/15/2017  Orientation to time 5  Orientation to Place 5  Registration 3  Attention/ Calculation 5  Recall 1  Language- name 2 objects 2  Language- repeat 1  Language- follow 3 step command 3  Language- read & follow direction 1  Write a sentence 1  Copy design 0  Total score 27     6CIT Screen 07/07/2018  What Year? 0 points  What month? 0 points  What time? 0 points  Count back from 20 0 points  Months in reverse 4 points    Immunization History  Administered Date(s) Administered  . Influenza  Split 08/20/2011, 08/28/2012, 09/29/2012  . Influenza Whole 09/25/2007, 09/20/2009  . Influenza, High Dose Seasonal PF 09/03/2014, 08/19/2017  . Influenza,inj,Quad PF,6+ Mos 08/30/2013, 09/05/2015  . Influenza-Unspecified 08/22/2016  . Pneumococcal Conjugate-13 09/29/2014  . Pneumococcal Polysaccharide-23 12/02/2005  . Td 11/01/1993  . Tdap 11/27/2013  . Zoster 02/17/2007    Q  Screening Tests Health Maintenance  Topic Date Due  . INFLUENZA VACCINE  07/02/2018  . TETANUS/TDAP  11/28/2023  . DEXA SCAN  Completed  . PNA vac Low Risk Adult  Completed         Plan:    Follow Up with PCP as advised   I have personally reviewed and noted the following in the patient's chart:   . Medical and social history . Use of alcohol, tobacco or illicit drugs  . Current medications and supplements . Functional ability and status . Nutritional status . Physical activity . Advanced directives . List of other physicians . Hospitalizations, surgeries, and ER visits in previous 12 months . Vitals . Screenings to include cognitive, depression, and falls . Referrals and appointments  In addition, I have reviewed and discussed with patient certain preventive protocols, quality metrics, and best practice recommendations. A written personalized care plan for preventive services as well as general preventive health recommendations were provided to patient.     Angelina Sheriff [place], [place]  12/07/1094

## 2018-07-30 ENCOUNTER — Ambulatory Visit (INDEPENDENT_AMBULATORY_CARE_PROVIDER_SITE_OTHER): Payer: Medicare Other | Admitting: Family Medicine

## 2018-07-30 ENCOUNTER — Encounter: Payer: Self-pay | Admitting: Family Medicine

## 2018-07-30 DIAGNOSIS — H8110 Benign paroxysmal vertigo, unspecified ear: Secondary | ICD-10-CM | POA: Diagnosis not present

## 2018-07-30 DIAGNOSIS — I701 Atherosclerosis of renal artery: Secondary | ICD-10-CM

## 2018-07-30 DIAGNOSIS — G2581 Restless legs syndrome: Secondary | ICD-10-CM | POA: Diagnosis not present

## 2018-07-30 DIAGNOSIS — I10 Essential (primary) hypertension: Secondary | ICD-10-CM | POA: Diagnosis not present

## 2018-07-30 DIAGNOSIS — F330 Major depressive disorder, recurrent, mild: Secondary | ICD-10-CM | POA: Diagnosis not present

## 2018-07-30 MED ORDER — ROTIGOTINE 3 MG/24HR TD PT24: 3.0000 mg | MEDICATED_PATCH | Freq: Every day | TRANSDERMAL | 3 refills | Status: DC

## 2018-07-30 NOTE — Progress Notes (Signed)
Subjective:  Cheryl ReeseBetty L Atkinson is a 84 y.o. year old very pleasant female patient who presents for/with See problem oriented charting ROS- No chest pain or shortness of breath. No headache or blurry vision. Still having vertigo   Past Medical History-  Patient Active Problem List   Diagnosis Date Noted  . Memory loss 04/11/2016    Priority: High  . Renal artery stenosis (HCC) 09/27/2015    Priority: High  . Claudication (HCC) 05/04/2015    Priority: High  . Atherosclerotic PVD with intermittent claudication (HCC) 04/26/2015    Priority: High  . DNR (do not resuscitate) 09/29/2014    Priority: High  . Insulin resistance 07/19/2014    Priority: High  . LOW BACK PAIN 09/25/2007    Priority: High  . Major depression 03/25/2018    Priority: Medium  . BPPV (benign paroxysmal positional vertigo) 07/12/2016    Priority: Medium  . Hyperlipidemia 06/30/2015    Priority: Medium  . Former smoker 09/29/2014    Priority: Medium  . COPD (chronic obstructive pulmonary disease) (HCC) 09/20/2009    Priority: Medium  . Iron deficiency anemia 11/09/2007    Priority: Medium  . RESTLESS LEG SYNDROME 09/25/2007    Priority: Medium  . Essential hypertension 09/25/2007    Priority: Medium  . GERD 09/25/2007    Priority: Medium  . Osteoporosis 09/25/2007    Priority: Medium  . Cat bite of right hand 12/21/2016    Priority: Low  . Mallet toe of right foot 10/20/2015    Priority: Low  . Tinnitus 12/31/2014    Priority: Low  . Multinodular goiter 08/30/2013    Priority: Low  . Spinal stenosis of lumbar region at multiple levels 09/29/2012    Priority: Low  . HIP PAIN, BILATERAL 07/16/2010    Priority: Low  . CONSTIPATION, CHRONIC 09/20/2009    Priority: Low  . History of UTI 11/09/2007    Priority: Low    Medications- reviewed and updated Current Outpatient Medications  Medication Sig Dispense Refill  . aspirin EC 81 MG EC tablet Take 1 tablet (81 mg total) by mouth daily.    Marland Kitchen.  atorvastatin (LIPITOR) 40 MG tablet Take 1 tablet (40 mg total) by mouth daily at 6 PM. 90 tablet 3  . clopidogrel (PLAVIX) 75 MG tablet Take 1 tablet (75 mg total) by mouth daily with breakfast. 90 tablet 3  . CONZIP 300 MG CP24 Take 300 mg by mouth every morning.    . metFORMIN (GLUCOPHAGE) 500 MG tablet TAKE 1 (1) TABLET BY MOUTH DAILY 90 tablet 3  . Multiple Vitamins-Minerals (PRESERVISION AREDS 2 PO) Take 1 tablet by mouth 2 (two) times daily.    . Olmesartan-amLODIPine-HCTZ (TRIBENZOR) 40-10-12.5 MG TABS Take 1 tablet by mouth every morning. 90 tablet 3  . polyethylene glycol (MIRALAX / GLYCOLAX) packet Take 17 g by mouth at bedtime as needed for mild constipation. Reported on 03/14/2016    . rotigotine (NEUPRO) 2 MG/24HR APPLY 1 PATCH TO SKIN DAILY 90 patch 1  . traMADol (ULTRAM) 50 MG tablet Take 50 mg by mouth at bedtime as needed for moderate pain.    . traMADol (ULTRAM-ER) 300 MG 24 hr tablet Take 300 mg by mouth daily.    . Rotigotine 3 MG/24HR PT24 Place 3 mg onto the skin at bedtime. 90 patch 3   No current facility-administered medications for this visit.     Objective: BP 120/70 (BP Location: Left Arm, Patient Position: Sitting, Cuff Size: Normal)  Pulse 62   Temp 98.1 F (36.7 C) (Oral)   Ht 5\' 6"  (1.676 m)   Wt 153 lb 4 oz (69.5 kg)   SpO2 100%   BMI 24.74 kg/m  Gen: NAD, resting comfortably CV: RRR no murmurs rubs or gallops Lungs: CTAB no crackles, wheeze, rhonchi Abdomen: soft/nontender/normal weight Ext: no edema Skin: warm, dry Neuro:  Walks with walker  Assessment/Plan:  Essential hypertension S: controlled on  tribenzor 40-10-12.5mg . She avoided coffee today before visit.  BP Readings from Last 3 Encounters:  07/30/18 120/70  07/07/18 (!) 146/70  06/25/18 (!) 146/76  A/P: We discussed blood pressure goal of <140/90. Continue current meds  Major depression S: continues to not want to use rx or counseling. States improved within a week of last visit  as got more engaged  Lifestyle modifications 1.  [facility]- going to be working on machines with a trainer and doing some walking. Plans on doing 2-3x a week.  2. She did explore pace but didn't want to lose me as PCP- has opted against this change 3. Sister got her some activities for the house- Enjoys adult coloring books and word puzzles.  4. Trying to go to church more regularly Depression screen [facility] 2/9 07/07/2018 03/25/2018 05/15/2017  Decreased Interest 0 3 1  Down, Depressed, Hopeless 0 2 1  PHQ - 2 Score 0 5 2  Altered sleeping 1 3 0  Tired, decreased energy 2 3 3   Change in appetite 1 3 0  Feeling bad or failure about yourself  1 0 0  Trouble concentrating 0 0 0  Moving slowly or fidgety/restless 2 0 0  Suicidal thoughts 0 0 0  PHQ-9 Score 7 14 5   Difficult doing work/chores Not difficult at all Somewhat difficult Not difficult at all  A/P: mild phq9 elevation- continue to work on lifestyle changes- repeat phq9 at follow up and hopefully 5 or under. Declines meds and counseling again  BPPV (benign paroxysmal positional vertigo) S: still having vertigo with turning her head in bed for example. Dramamine helps but doesn't want to take everyday.  A/P: encouraged vestibular rehab- she declines again  RESTLESS LEG SYNDROME S: restless legs bothering her more on 2 mg neupro A/P: increase neupro to 3mg  - consider ferritin next labs   Future Appointments  Date Time Provider Department Center  09/30/2018  9:00 AM Shelva Majestic, MD [facility]  07/14/2019  2:00 PM [facility]ALTH COACH [facility]   Return in about 2 months (around 09/29/2018).  Lab/Order associations: Essential hypertension  Mild episode of recurrent major depressive disorder (HCC)  Benign paroxysmal positional vertigo, unspecified laterality  RESTLESS LEG SYNDROME  Meds ordered this encounter  Medications  . Rotigotine 3 MG/24HR PT24    Sig: Place 3 mg onto the skin  at bedtime.    Dispense:  90 patch    Refill:  3   Return precautions advised.  Tana Conch, MD

## 2018-07-30 NOTE — Assessment & Plan Note (Signed)
S: controlled on  tribenzor 40-10-12.5mg . She avoided coffee today before visit.  BP Readings from Last 3 Encounters:  07/30/18 120/70  07/07/18 (!) 146/70  06/25/18 (!) 146/76  A/P: We discussed blood pressure goal of <140/90. Continue current meds

## 2018-07-30 NOTE — Assessment & Plan Note (Signed)
S: restless legs bothering her more on 2 mg neupro A/P: increase neupro to 3mg  - consider ferritin next labs

## 2018-07-30 NOTE — Assessment & Plan Note (Signed)
S: still having vertigo with turning her head in bed for example. Dramamine helps but doesn't want to take everyday.  A/P: encouraged vestibular rehab- she declines again

## 2018-07-30 NOTE — Patient Instructions (Addendum)
Increase neurop to 3 mg  Consider vestibular rehab to help with the vertigo symptoms  Glad you are feeling better mood wise- keep up with the churchgoing and the [facility] exercising!

## 2018-07-30 NOTE — Assessment & Plan Note (Signed)
S: continues to not want to use rx or counseling. States improved within a week of last visit as got more engaged  Lifestyle modifications 1.  has signed up at [facility]- going to be working on machines with a trainer and doing some walking. Plans on doing 2-3x a week.  2. She did explore pace but didn't want to lose me as PCP- has opted against this change 3. Sister got her some activities for the house- Enjoys adult coloring books and word puzzles.  4. Trying to go to church more regularly Depression screen [facility] 2/9 07/07/2018 03/25/2018 05/15/2017  Decreased Interest 0 3 1  Down, Depressed, Hopeless 0 2 1  PHQ - 2 Score 0 5 2  Altered sleeping 1 3 0  Tired, decreased energy 2 3 3   Change in appetite 1 3 0  Feeling bad or failure about yourself  1 0 0  Trouble concentrating 0 0 0  Moving slowly or fidgety/restless 2 0 0  Suicidal thoughts 0 0 0  PHQ-9 Score 7 14 5   Difficult doing work/chores Not difficult at all Somewhat difficult Not difficult at all  A/P: mild phq9 elevation- continue to work on lifestyle changes- repeat phq9 at follow up and hopefully 5 or under. Declines meds and counseling again

## 2018-08-11 ENCOUNTER — Telehealth: Payer: Self-pay | Admitting: Family Medicine

## 2018-08-11 NOTE — Telephone Encounter (Signed)
See note.  Copied from CRM [id number]. Topic: General - Other >> Aug 11, 2018 12:24 PM Arlyss Gandy, NT wrote: Reason for CRM: Pts sister, Steward Drone calling to see if there can be an order for her sister to have a walker with higher handles on it. She states her sister is leaning a lot when trying to push her current walker. She states the handles on the one she has now cannot be adjusted. CB#: [phone]

## 2018-08-12 ENCOUNTER — Ambulatory Visit: Payer: Self-pay | Admitting: *Deleted

## 2018-08-12 NOTE — Telephone Encounter (Signed)
Spoke with patient as her sister is not on the [place]. I faxed over an order for a rollator walker to [facility] supply. Patient is not sure if she can afford it.

## 2018-08-12 NOTE — Telephone Encounter (Signed)
Pt states she has been dizzy for the past 3 days. Pt states that moving her head makes dizziness worse and has to hold on to something with walking. Pt states at times she felt as if she was going to pass out but does not have complaints of this currently.Pt states she does have a history of vertigo. Pt states she has not been eating for the past 3 days because just looking at food makes her not want to eat. Pt states she has been keeping hydrated and denies any nausea or vomiting at this time. Pt offered to make appt with another provider but pt prefers to only see Dr. Durene Cal. Appt scheduled for Friday 08/14/18. Pt advised to return call to the office if she became worse or to seek care in the ED if she felt that she was going to pass out. Pt verbalized understanding.   Reason for Disposition . [1] MODERATE dizziness (e.g., interferes with normal activities) AND [2] has been evaluated by physician for this  Answer Assessment - Initial Assessment Questions 1. DESCRIPTION: "Describe your dizziness."    Dizziness comes and goes and has to use walker or hold on to something with walking 2. LIGHTHEADED: "Do you feel lightheaded?" (e.g., somewhat faint, woozy, weak upon standing)     yes 3. VERTIGO: "Do you feel like either you or the room is spinning or tilting?" (i.e. vertigo)     Not at this time but has a history of vertigo 4. SEVERITY: "How bad is it?"  "Do you feel like you are going to faint?" "Can you stand and walk?"   - MILD - walking normally   - MODERATE - interferes with normal activities (e.g., work, school)    - SEVERE - unable to stand, requires support to walk, feels like passing out now.      Moderate at this ttime 5. ONSET:  "When did the dizziness begin?"     3 days ago  6. AGGRAVATING FACTORS: "Does anything make it worse?" (e.g., standing, change in head position)     Yes turning head 7. HEART RATE: "Can you tell me your heart rate?" "How many beats in 15 seconds?"  (Note: not  all patients can do this)       Does not know that  8. CAUSE: "What do you think is causing the dizziness?"     unknown 9. RECURRENT SYMPTOM: "Have you had dizziness before?" If so, ask: "When was the last time?" "What happened that time?"     yes 10. OTHER SYMPTOMS: "Do you have any other symptoms?" (e.g., fever, chest pain, vomiting, diarrhea, bleeding)       No 11. PREGNANCY: "Is there any chance you are pregnant?" "When was your last menstrual period?"       n/a  Protocols used: DIZZINESS [facility]

## 2018-08-14 ENCOUNTER — Ambulatory Visit (INDEPENDENT_AMBULATORY_CARE_PROVIDER_SITE_OTHER): Payer: Medicare Other | Admitting: Family Medicine

## 2018-08-14 ENCOUNTER — Ambulatory Visit (INDEPENDENT_AMBULATORY_CARE_PROVIDER_SITE_OTHER)
Admission: RE | Admit: 2018-08-14 | Discharge: 2018-08-14 | Disposition: A | Discharge status: Home / Self Care | Payer: Medicare Other | Source: Ambulatory Visit | Attending: Family Medicine | Admitting: Family Medicine

## 2018-08-14 ENCOUNTER — Encounter: Payer: Self-pay | Admitting: Family Medicine

## 2018-08-14 VITALS — BP 124/82 | HR 76 | Temp 97.9°F | Ht 66.0 in | Wt 149.4 lb

## 2018-08-14 DIAGNOSIS — I701 Atherosclerosis of renal artery: Secondary | ICD-10-CM

## 2018-08-14 DIAGNOSIS — H532 Diplopia: Secondary | ICD-10-CM

## 2018-08-14 DIAGNOSIS — R42 Dizziness and giddiness: Secondary | ICD-10-CM | POA: Diagnosis not present

## 2018-08-14 LAB — CBC
HCT: 34.9 % — ABNORMAL LOW (ref 36.0–46.0)
Hemoglobin: 11.6 g/dL — ABNORMAL LOW (ref 12.0–15.0)
MCHC: 33.3 g/dL (ref 30.0–36.0)
MCV: 86.2 fl (ref 78.0–100.0)
Platelets: 284 10*3/uL (ref 150.0–400.0)
RBC: 4.05 Mil/uL (ref 3.87–5.11)
RDW: 13.7 % (ref 11.5–15.5)
WBC: 7.9 10*3/uL (ref 4.0–10.5)

## 2018-08-14 LAB — COMPREHENSIVE METABOLIC PANEL
ALT: 11 U/L (ref 0–35)
AST: 15 U/L (ref 0–37)
Albumin: 4.3 g/dL (ref 3.5–5.2)
Alkaline Phosphatase: 102 U/L (ref 39–117)
BUN: 24 mg/dL — ABNORMAL HIGH (ref 6–23)
CO2: 27 mEq/L (ref 19–32)
Calcium: 9.3 mg/dL (ref 8.4–10.5)
Chloride: 100 mEq/L (ref 96–112)
Creatinine, Ser: 0.88 mg/dL (ref 0.40–1.20)
GFR: 64.91 mL/min (ref >60.00)
Glucose, Bld: 104 mg/dL — ABNORMAL HIGH (ref 70–99)
Potassium: 4.1 mEq/L (ref 3.5–5.1)
Sodium: 139 mEq/L (ref 135–145)
Total Bilirubin: 0.6 mg/dL (ref 0.2–1.2)
Total Protein: 7.4 g/dL (ref 6.0–8.3)

## 2018-08-14 LAB — TSH: TSH: 0.85 u[IU]/mL (ref 0.35–4.50)

## 2018-08-14 NOTE — Patient Instructions (Addendum)
EKG without obvious cause of dizziness  Please stop by lab before you go  I think this is most likely due to your history of vertigo but the blurry vision concerns me. I probably cannot get an MRI for longer but can likely get CT pretty soon so I want to get a CT of your head - Lea our practice manager will bring you information about this in the lobby after your labs  We will likely get an MRI if your symptoms persist  For new or worsening symptoms please seek care immediately by calling 911  Schedule follow up next Tuesday so we can check in

## 2018-08-14 NOTE — Progress Notes (Signed)
Subjective:  Cheryl Atkinson is a 84 y.o. year old very pleasant female patient who presents for/with See problem oriented charting ROS- see ROS below. No facial or extremity weakness. No slurred words or trouble swallowing. Admits to  blurry vision or double vision. No paresthesias. No confusion or word finding difficulties above baselin   Past Medical History-  Patient Active Problem List   Diagnosis Date Noted  . Memory loss 04/11/2016    Priority: High  . Renal artery stenosis (HCC) 09/27/2015    Priority: High  . Claudication (HCC) 05/04/2015    Priority: High  . Atherosclerotic PVD with intermittent claudication (HCC) 04/26/2015    Priority: High  . DNR (do not resuscitate) 09/29/2014    Priority: High  . Insulin resistance 07/19/2014    Priority: High  . LOW BACK PAIN 09/25/2007    Priority: High  . Major depression 03/25/2018    Priority: Medium  . BPPV (benign paroxysmal positional vertigo) 07/12/2016    Priority: Medium  . Hyperlipidemia 06/30/2015    Priority: Medium  . Former smoker 09/29/2014    Priority: Medium  . COPD (chronic obstructive pulmonary disease) (HCC) 09/20/2009    Priority: Medium  . Iron deficiency anemia 11/09/2007    Priority: Medium  . RESTLESS LEG SYNDROME 09/25/2007    Priority: Medium  . Essential hypertension 09/25/2007    Priority: Medium  . GERD 09/25/2007    Priority: Medium  . Osteoporosis 09/25/2007    Priority: Medium  . Cat bite of right hand 12/21/2016    Priority: Low  . Mallet toe of right foot 10/20/2015    Priority: Low  . Tinnitus 12/31/2014    Priority: Low  . Multinodular goiter 08/30/2013    Priority: Low  . Spinal stenosis of lumbar region at multiple levels 09/29/2012    Priority: Low  . HIP PAIN, BILATERAL 07/16/2010    Priority: Low  . CONSTIPATION, CHRONIC 09/20/2009    Priority: Low  . History of UTI 11/09/2007    Priority: Low    Medications- reviewed and updated Current Outpatient Medications   Medication Sig Dispense Refill  . aspirin EC 81 MG EC tablet Take 1 tablet (81 mg total) by mouth daily.    Marland Kitchen atorvastatin (LIPITOR) 40 MG tablet Take 1 tablet (40 mg total) by mouth daily at 6 PM. 90 tablet 3  . clopidogrel (PLAVIX) 75 MG tablet Take 1 tablet (75 mg total) by mouth daily with breakfast. 90 tablet 3  . CONZIP 300 MG CP24 Take 300 mg by mouth every morning.    . metFORMIN (GLUCOPHAGE) 500 MG tablet TAKE 1 (1) TABLET BY MOUTH DAILY 90 tablet 3  . Multiple Vitamins-Minerals (PRESERVISION AREDS 2 PO) Take 1 tablet by mouth 2 (two) times daily.    . Olmesartan-amLODIPine-HCTZ (TRIBENZOR) 40-10-12.5 MG TABS Take 1 tablet by mouth every morning. 90 tablet 3  . polyethylene glycol (MIRALAX / GLYCOLAX) packet Take 17 g by mouth at bedtime as needed for mild constipation. Reported on 03/14/2016    . rotigotine (NEUPRO) 2 MG/24HR APPLY 1 PATCH TO SKIN DAILY 90 patch 1  . Rotigotine 3 MG/24HR PT24 Place 3 mg onto the skin at bedtime. 90 patch 3  . traMADol (ULTRAM) 50 MG tablet Take 50 mg by mouth at bedtime as needed for moderate pain.    . traMADol (ULTRAM-ER) 300 MG 24 hr tablet Take 300 mg by mouth daily.     No current facility-administered medications for this visit.  Objective: BP 124/82 (BP Location: Left Arm, Patient Position: Sitting, Cuff Size: Normal)   Pulse 76   Temp 97.9 F (36.6 C) (Oral)   Ht 5\' 6"  (1.676 m)   Wt 149 lb 6.4 oz (67.8 kg)   SpO2 99%   BMI 24.11 kg/m  Gen: NAD, resting comfortably CV: RRR no murmurs rubs or gallops Lungs: CTAB no crackles, wheeze, rhonchi Abdomen: soft/nontender/nondistended/normal bowel sounds.  Ext: no edema Skin: warm, dry Neuro: CN II-XII intact, sensation and reflexes normal throughout, 5/5 muscle strength in bilateral upper and lower extremities. Normal finger to nose. Normal rapid alternating movements. No pronator drift. Normal romberg. Normal gait. I could not reproduce blurry vision or diplopia   EKG: sinus rhythm  with rate 68, left  axis, normal intervals, no hypertrophy,  somewhat flat t waves in avl, v1, v2, no significant st- or t wave chages-  Ct Head Wo Contrast  Result Date: 08/14/2018 CLINICAL DATA:  Dizziness and diplopia for 2 weeks EXAM: CT HEAD WITHOUT CONTRAST TECHNIQUE: Contiguous axial images were obtained from the base of the skull through the vertex without intravenous contrast. COMPARISON:  04/11/2013 FINDINGS: Brain: Chronic atrophic and ischemic changes are again identified and stable. A few small lacunar infarcts are seen in the thalami bilaterally. No findings to suggest acute hemorrhage, acute infarction or space-occupying mass lesion are noted. Vascular: No hyperdense vessel or unexpected calcification. Skull: Normal. Negative for fracture or focal lesion. Sinuses/Orbits: No acute finding. Other: None. IMPRESSION: Chronic atrophic and ischemic changes without acute abnormality. Electronically Signed   By: Alcide CleverMark  Lukens M.D.   On: 08/14/2018 14:58    Assessment/Plan:  Dizziness - Plan: EKG 12-Lead, CBC, Comprehensive metabolic panel, TSH  Diplopia - Plan: CT Head Wo Contrast, CBC, Comprehensive metabolic panel, TSH S: From [facility] message "Pt states she has been dizzy for the past 3 days. Pt states that moving her head makes dizziness worse and has to hold on to something with walking. Pt states at times she felt as if she was going to pass out but does not have complaints of this currently.Pt states she does have a history of vertigo. Pt states she has not been eating for the past 3 days because just looking at food makes her not want to eat. Pt states she has been keeping hydrated and denies any nausea or vomiting at this time. Pt offered to make appt with another provider but pt prefers to only see Dr. Durene CalHunter. Appt scheduled for Friday 08/14/18. Pt advised to return call to the office if she became worse or to seek care in the ED if she felt that she was going to pass out. Pt verbalized  understanding. "  Today patient states she has now had dizziness for 5 days. She states has to be cautious about head movements. If she changes position like turning in bed or standing up from chair notices it. Has felt like room spinning perhaps once but otherwise just feels lightheaded. Head feels like its full of fluid but she doesn't feel dizzy when resting- states head feels big.she states doesn't feel nauseous on one hand but on other hand when goes to eat just doesn't feel like eating at all during this and has lost 4 lbs.    She states dramamine helps some- only used once and helped for a little while. She is less certain that this is vertigo so opted not to continue to take it.   Has noted worsening vision just during this episode.  Getting double vision particularly if holds head up but doesn't help.   ROS- No hearing loss. No tinnitus. Not sick lately. No palpitations. No chest pai or shortness of breath. No falls. No vomiting.  A/P: 84 year old woman with PAD, renal artery stenosis, hypertension, BPPV presents with intermittent dizziness worse with position change- history of BPPV but does not have vertigo as she usually does with this.  - will get stat CT hopefully today to rule out CVA. No obvious posterior CVA on imaging - no obvious arhythmia on EKG - doubt myasthenia gravis- could not reproduce symptoms today and symptoms improving -   Will follow up on Tuesday- if persistent symptoms consider MRI or MRA - already on plavix and aspirin- if there was a new stroke- may need to consider evaluation for a fib with extended monitoring with cardiology input- could also consider pushing LDL under 70- slightly above at present   Future Appointments  Date Time Provider Department Center  08/18/2018 10:15 AM Shelva Majestic, MD [facility]  09/30/2018  9:00 AM Shelva Majestic, MD [facility]  07/14/2019  2:00 PM [facility]   Lab/Order associations: Dizziness -  Plan: EKG 12-Lead, CBC, Comprehensive metabolic panel, TSH  Diplopia - Plan: CT Head Wo Contrast, CBC, Comprehensive metabolic panel, TSH  Return precautions advised.  Tana Conch, MD

## 2018-08-18 ENCOUNTER — Encounter: Payer: Self-pay | Admitting: Family Medicine

## 2018-08-18 ENCOUNTER — Ambulatory Visit (INDEPENDENT_AMBULATORY_CARE_PROVIDER_SITE_OTHER): Payer: Medicare Other | Admitting: Family Medicine

## 2018-08-18 VITALS — BP 138/68 | HR 70 | Temp 97.8°F | Ht 66.0 in | Wt 150.8 lb

## 2018-08-18 DIAGNOSIS — H532 Diplopia: Secondary | ICD-10-CM

## 2018-08-18 DIAGNOSIS — R42 Dizziness and giddiness: Secondary | ICD-10-CM | POA: Diagnosis not present

## 2018-08-18 DIAGNOSIS — I701 Atherosclerosis of renal artery: Secondary | ICD-10-CM

## 2018-08-18 DIAGNOSIS — Z23 Encounter for immunization: Secondary | ICD-10-CM | POA: Diagnosis not present

## 2018-08-18 DIAGNOSIS — I10 Essential (primary) hypertension: Secondary | ICD-10-CM | POA: Diagnosis not present

## 2018-08-18 NOTE — Patient Instructions (Addendum)
So glad you are feeling better!  Glad double vision is gone and no new stroke. There are some old strokes but you are on the right medicine to prevent a future one.   Let us know if recurrence and see us back  Sounds like dehydration was a big factor- make sure to stay well hydrated  High dose flu shot today

## 2018-08-18 NOTE — Progress Notes (Signed)
Subjective:  Cheryl Atkinson is a 84 y.o. year old very pleasant female patient who presents for/with See problem oriented charting ROS- mild dizziness if stands quickly- twice over last several days total (much improved). Baseline blurry vision. No double vision. No chest pain or shortness of breath. No headaches.    Past Medical History-  Patient Active Problem List   Diagnosis Date Noted  . Memory loss 04/11/2016    Priority: High  . Renal artery stenosis (HCC) 09/27/2015    Priority: High  . Claudication (HCC) 05/04/2015    Priority: High  . Atherosclerotic PVD with intermittent claudication (HCC) 04/26/2015    Priority: High  . DNR (do not resuscitate) 09/29/2014    Priority: High  . Insulin resistance 07/19/2014    Priority: High  . LOW BACK PAIN 09/25/2007    Priority: High  . Major depression 03/25/2018    Priority: Medium  . BPPV (benign paroxysmal positional vertigo) 07/12/2016    Priority: Medium  . Hyperlipidemia 06/30/2015    Priority: Medium  . Former smoker 09/29/2014    Priority: Medium  . COPD (chronic obstructive pulmonary disease) (HCC) 09/20/2009    Priority: Medium  . Iron deficiency anemia 11/09/2007    Priority: Medium  . RESTLESS LEG SYNDROME 09/25/2007    Priority: Medium  . Essential hypertension 09/25/2007    Priority: Medium  . GERD 09/25/2007    Priority: Medium  . Osteoporosis 09/25/2007    Priority: Medium  . Cat bite of right hand 12/21/2016    Priority: Low  . Mallet toe of right foot 10/20/2015    Priority: Low  . Tinnitus 12/31/2014    Priority: Low  . Multinodular goiter 08/30/2013    Priority: Low  . Spinal stenosis of lumbar region at multiple levels 09/29/2012    Priority: Low  . HIP PAIN, BILATERAL 07/16/2010    Priority: Low  . CONSTIPATION, CHRONIC 09/20/2009    Priority: Low  . History of UTI 11/09/2007    Priority: Low    Medications- reviewed and updated Current Outpatient Medications  Medication Sig Dispense  Refill  . aspirin EC 81 MG EC tablet Take 1 tablet (81 mg total) by mouth daily.    Marland Kitchen. atorvastatin (LIPITOR) 40 MG tablet Take 1 tablet (40 mg total) by mouth daily at 6 PM. 90 tablet 3  . clopidogrel (PLAVIX) 75 MG tablet Take 1 tablet (75 mg total) by mouth daily with breakfast. 90 tablet 3  . CONZIP 300 MG CP24 Take 300 mg by mouth every morning.    . metFORMIN (GLUCOPHAGE) 500 MG tablet TAKE 1 (1) TABLET BY MOUTH DAILY 90 tablet 3  . Multiple Vitamins-Minerals (PRESERVISION AREDS 2 PO) Take 1 tablet by mouth 2 (two) times daily.    . Olmesartan-amLODIPine-HCTZ (TRIBENZOR) 40-10-12.5 MG TABS Take 1 tablet by mouth every morning. 90 tablet 3  . polyethylene glycol (MIRALAX / GLYCOLAX) packet Take 17 g by mouth at bedtime as needed for mild constipation. Reported on 03/14/2016    . rotigotine (NEUPRO) 2 MG/24HR APPLY 1 PATCH TO SKIN DAILY 90 patch 1  . Rotigotine 3 MG/24HR PT24 Place 3 mg onto the skin at bedtime. 90 patch 3  . traMADol (ULTRAM) 50 MG tablet Take 50 mg by mouth at bedtime as needed for moderate pain.    . traMADol (ULTRAM-ER) 300 MG 24 hr tablet Take 300 mg by mouth daily.     No current facility-administered medications for this visit.     Objective:  BP 138/68 (BP Location: Left Arm, Patient Position: Sitting, Cuff Size: Normal)   Pulse 70   Temp 97.8 F (36.6 C) (Oral)   Ht 5\' 6"  (1.676 m)   Wt 150 lb 12.8 oz (68.4 kg)   LMP  (LMP Unknown)   SpO2 99%   BMI 24.34 kg/m  Gen: NAD, resting comfortably CV: RRR  Lungs: nonlabored, normal respiratory rate Abdomen: soft/nondistended Ext: no edema Skin: warm, dry  Assessment/Plan:  Dizziness  Diplopia  Essential hypertension S: Patient was seen 4 days ago for dizziness and diplopia. History of BPPV in past but this has felt different to her. Had 5 days of symptoms priot to visit so we thought CT would be prudent due to time factors to rule out large stroke. CT without obvious stroke. EKG ws reassuring. Doubted  myasthenia gravis. She is on plavix and aspirin- if there was new stroke would consider a fib and embolic cause. LDL slightly high too if obvious stroke so would push lower.   Plan was to consider MRI or MRA if persistent symptoms today.   Today, she states the double vision has resolved- no more episodes since last visit. She is back to baseline blurry vision- with known macular degeneration/cyst on retina. Dizziness is down to perhaps twie since last visit. If she watches position changes seems to not have issues. She still states this doesn't feel like prior episodes of vertigo. Sititng to standing seems to be main time- if does this slowly much less issues.  A/P: We spent time reviewing prior CT results and labwork which was largely reassuring. Prior CVA unknown when- likely predates plavix, asa, statin started in 2016- CVA not noted in 2014 head CT. Labs reassuring as well other than mild dehydration. With pushing fluids dizziness has nearly resolved- this was likely dehydration related. BP high normal on tribenzor but with PAD dont feel we should decrease dose. hctz in that promotes dehydration- encouraged her to stay well hydrated as she has- if recurrence of issues may be forced to stop hctz and consider another rx.   Future Appointments  Date Time Provider Department Center  09/30/2018  9:00 AM Shelva Majestic, MD [facility]  07/14/2019  2:00 PM [facility]   Time Stamp The duration of face-to-face time during this visit was greater than 15 minutes. Greater than 50% of this time was spent in counseling, explanation of diagnosis, planning of further management, and/or coordination of care including reviewing prior bloodwork and ct, emphasizing hydration.    Return precautions advised.  Tana Conch, MD

## 2018-08-18 NOTE — Addendum Note (Signed)
Addended by: Rivka SaferSOUTHERN HIZER, Asher MuirJAMIE M on: 08/18/2018 11:08 AM   Modules accepted: Orders

## 2018-08-20 DIAGNOSIS — R52 Pain, unspecified: Secondary | ICD-10-CM | POA: Diagnosis not present

## 2018-08-20 DIAGNOSIS — I10 Essential (primary) hypertension: Secondary | ICD-10-CM | POA: Diagnosis not present

## 2018-08-20 DIAGNOSIS — E785 Hyperlipidemia, unspecified: Secondary | ICD-10-CM | POA: Diagnosis not present

## 2018-08-20 DIAGNOSIS — R070 Pain in throat: Secondary | ICD-10-CM | POA: Diagnosis not present

## 2018-08-20 DIAGNOSIS — R1030 Lower abdominal pain, unspecified: Secondary | ICD-10-CM | POA: Diagnosis not present

## 2018-08-20 DIAGNOSIS — R5383 Other fatigue: Secondary | ICD-10-CM | POA: Diagnosis not present

## 2018-09-30 ENCOUNTER — Encounter: Payer: Self-pay | Admitting: Family Medicine

## 2018-09-30 ENCOUNTER — Ambulatory Visit (INDEPENDENT_AMBULATORY_CARE_PROVIDER_SITE_OTHER): Payer: Medicare Other | Admitting: Family Medicine

## 2018-09-30 VITALS — BP 136/68 | HR 78 | Temp 97.5°F | Ht 66.0 in | Wt 150.2 lb

## 2018-09-30 DIAGNOSIS — E8881 Metabolic syndrome: Secondary | ICD-10-CM

## 2018-09-30 DIAGNOSIS — G2581 Restless legs syndrome: Secondary | ICD-10-CM

## 2018-09-30 DIAGNOSIS — I701 Atherosclerosis of renal artery: Secondary | ICD-10-CM | POA: Diagnosis not present

## 2018-09-30 DIAGNOSIS — J438 Other emphysema: Secondary | ICD-10-CM

## 2018-09-30 DIAGNOSIS — E785 Hyperlipidemia, unspecified: Secondary | ICD-10-CM

## 2018-09-30 DIAGNOSIS — I70219 Atherosclerosis of native arteries of extremities with intermittent claudication, unspecified extremity: Secondary | ICD-10-CM

## 2018-09-30 DIAGNOSIS — F330 Major depressive disorder, recurrent, mild: Secondary | ICD-10-CM

## 2018-09-30 DIAGNOSIS — D509 Iron deficiency anemia, unspecified: Secondary | ICD-10-CM

## 2018-09-30 DIAGNOSIS — R739 Hyperglycemia, unspecified: Secondary | ICD-10-CM

## 2018-09-30 DIAGNOSIS — I10 Essential (primary) hypertension: Secondary | ICD-10-CM

## 2018-09-30 LAB — COMPREHENSIVE METABOLIC PANEL
ALT: 11 U/L (ref 0–35)
AST: 17 U/L (ref 0–37)
Albumin: 4.1 g/dL (ref 3.5–5.2)
Alkaline Phosphatase: 88 U/L (ref 39–117)
BUN: 25 mg/dL — ABNORMAL HIGH (ref 6–23)
CO2: 26 mEq/L (ref 19–32)
Calcium: 9.1 mg/dL (ref 8.4–10.5)
Chloride: 101 mEq/L (ref 96–112)
Creatinine, Ser: 0.85 mg/dL (ref 0.40–1.20)
GFR: 67.54 mL/min (ref >60.00)
Glucose, Bld: 136 mg/dL — ABNORMAL HIGH (ref 70–99)
Potassium: 3.6 mEq/L (ref 3.5–5.1)
Sodium: 137 mEq/L (ref 135–145)
Total Bilirubin: 0.5 mg/dL (ref 0.2–1.2)
Total Protein: 7.2 g/dL (ref 6.0–8.3)

## 2018-09-30 LAB — CBC
HCT: 33.7 % — ABNORMAL LOW (ref 36.0–46.0)
Hemoglobin: 11.1 g/dL — ABNORMAL LOW (ref 12.0–15.0)
MCHC: 33 g/dL (ref 30.0–36.0)
MCV: 87.5 fl (ref 78.0–100.0)
Platelets: 261 10*3/uL (ref 150.0–400.0)
RBC: 3.85 Mil/uL — ABNORMAL LOW (ref 3.87–5.11)
RDW: 13.9 % (ref 11.5–15.5)
WBC: 8.1 10*3/uL (ref 4.0–10.5)

## 2018-09-30 LAB — HEMOGLOBIN A1C: Hgb A1c MFr Bld: 6.2 % (ref 4.6–6.5)

## 2018-09-30 NOTE — Assessment & Plan Note (Signed)
S:Insulin resistance on metformin 500 mg daily Lab Results  Component Value Date   HGBA1C 5.7 (A) 06/25/2018   HGBA1C 6.4 03/25/2018   HGBA1C 5.8 08/19/2017   A/P: update a1c- trying to avoid DM with metformin

## 2018-09-30 NOTE — Progress Notes (Signed)
Subjective:  Cheryl Atkinson is a 84 y.o. year old very pleasant female patient who presents for/with See problem oriented charting ROS- still with some intermittent dizziness. No more blurry vision like previous. No chest pain or shortnss of breath.    Past Medical History-  Patient Active Problem List   Diagnosis Date Noted  . Memory loss 04/11/2016    Priority: High  . Renal artery stenosis (HCC) 09/27/2015    Priority: High  . Claudication (HCC) 05/04/2015    Priority: High  . Atherosclerotic PVD with intermittent claudication (HCC) 04/26/2015    Priority: High  . DNR (do not resuscitate) 09/29/2014    Priority: High  . Insulin resistance 07/19/2014    Priority: High  . LOW BACK PAIN 09/25/2007    Priority: High  . Major depression 03/25/2018    Priority: Medium  . BPPV (benign paroxysmal positional vertigo) 07/12/2016    Priority: Medium  . Hyperlipidemia 06/30/2015    Priority: Medium  . Former smoker 09/29/2014    Priority: Medium  . COPD (chronic obstructive pulmonary disease) (HCC) 09/20/2009    Priority: Medium  . Iron deficiency anemia 11/09/2007    Priority: Medium  . RESTLESS LEG SYNDROME 09/25/2007    Priority: Medium  . Essential hypertension 09/25/2007    Priority: Medium  . GERD 09/25/2007    Priority: Medium  . Osteoporosis 09/25/2007    Priority: Medium  . Cat bite of right hand 12/21/2016    Priority: Low  . Mallet toe of right foot 10/20/2015    Priority: Low  . Tinnitus 12/31/2014    Priority: Low  . Multinodular goiter 08/30/2013    Priority: Low  . Spinal stenosis of lumbar region at multiple levels 09/29/2012    Priority: Low  . HIP PAIN, BILATERAL 07/16/2010    Priority: Low  . CONSTIPATION, CHRONIC 09/20/2009    Priority: Low  . History of UTI 11/09/2007    Priority: Low    Medications- reviewed and updated Current Outpatient Medications  Medication Sig Dispense Refill  . aspirin EC 81 MG EC tablet Take 1 tablet (81 mg total) by  mouth daily.    Marland Kitchen atorvastatin (LIPITOR) 40 MG tablet Take 1 tablet (40 mg total) by mouth daily at 6 PM. 90 tablet 3  . clopidogrel (PLAVIX) 75 MG tablet Take 1 tablet (75 mg total) by mouth daily with breakfast. 90 tablet 3  . CONZIP 300 MG CP24 Take 300 mg by mouth every morning.    . metFORMIN (GLUCOPHAGE) 500 MG tablet TAKE 1 (1) TABLET BY MOUTH DAILY 90 tablet 3  . Multiple Vitamins-Minerals (PRESERVISION AREDS 2 PO) Take 1 tablet by mouth 2 (two) times daily.    . Olmesartan-amLODIPine-HCTZ (TRIBENZOR) 40-10-12.5 MG TABS Take 1 tablet by mouth every morning. 90 tablet 3  . polyethylene glycol (MIRALAX / GLYCOLAX) packet Take 17 g by mouth at bedtime as needed for mild constipation. Reported on 03/14/2016    . rotigotine (NEUPRO) 2 MG/24HR APPLY 1 PATCH TO SKIN DAILY 90 patch 1  . Rotigotine 3 MG/24HR PT24 Place 3 mg onto the skin at bedtime. 90 patch 3  . traMADol (ULTRAM) 50 MG tablet Take 50 mg by mouth at bedtime as needed for moderate pain.    . traMADol (ULTRAM-ER) 300 MG 24 hr tablet Take 300 mg by mouth daily.     No current facility-administered medications for this visit.     Objective: BP 136/68 (BP Location: Left Arm, Patient Position: Sitting, Cuff  Size: Large)   Pulse 78   Temp (!) 97.5 F (36.4 C) (Oral)   Ht 5\' 6"  (1.676 m)   Wt 150 lb 3.2 oz (68.1 kg)   LMP  (LMP Unknown)   SpO2 93%   BMI 24.24 kg/m  Gen: NAD, resting comfortably CV: RRR no murmurs rubs or gallops Lungs: CTAB no crackles, wheeze, rhonchi Abdomen: soft/nontender/nondistended/normal bowel sounds.  Ext: no edema Skin: warm, dry Neuro: walks with walker  Assessment/Plan:  Hypertension S: controlled on Tribenzor 40-10-12.5 mg.  Blood pressure is sometimes high with coffee BP Readings from Last 3 Encounters:  09/30/18 136/68  08/18/18 138/68  08/14/18 124/82  A/P: We discussed blood pressure goal of <140/90. Continue current meds   Major depression S: Patient with mildly elevated PHQ 9 at  last visit at 7.  This had improved from previous visits as she had tried to be more engaged with friends and family.  She denied counseling and medication at that visit A/P: counseling provided today- still wants to avoid formal counseling and medication. She will continue to try to remain engaged with friends nad family  RESTLESS LEG SYNDROME S: Back in August we increased her Neupro to 3 mg from 2 mg.  We consider updating ferritin level. States improved A/P:  Stable/improved- continue current medicine  Hyperlipidemia S:  controlled on atorvastatin 40 mg.  She is also compliant with Plavix for her atherosclerotic PVD with intermittent claudication- can walk 200 feet or less before getting claudication  Lab Results  Component Value Date   CHOL 179 05/06/2018   HDL 92 05/06/2018   LDLCALC 73 05/06/2018   LDLDIRECT 74.0 05/15/2017   TRIG 68 05/06/2018   CHOLHDL 1.9 05/06/2018   A/P: stable- continue current medications  Insulin resistance S:Insulin resistance on metformin 500 mg daily Lab Results  Component Value Date   HGBA1C 5.7 (A) 06/25/2018   HGBA1C 6.4 03/25/2018   HGBA1C 5.8 08/19/2017   A/P: update a1c- trying to avoid DM with metformin  Iron deficiency anemia Update CBC and ferritin. No obvious source of active bleeding.   COPD (chronic obstructive pulmonary disease) (HCC) Untreated copd. Improved a lot after quitting smoking- declines inhalers  Stable- continue to monitor   Future Appointments  Date Time Provider Department Center  07/14/2019  2:00 PM [facility]   Lab/Order associations: Essential hypertension - Plan: CBC, Comprehensive metabolic panel  Mild episode of recurrent major depressive disorder (HCC)  RESTLESS LEG SYNDROME  Other emphysema (HCC)  Hyperlipidemia, unspecified hyperlipidemia type  Atherosclerotic PVD with intermittent claudication (HCC)  Insulin resistance - Plan: Hemoglobin A1c  Iron deficiency anemia,  unspecified iron deficiency anemia type - Plan: Iron, TIBC and Ferritin Panel  Hyperglycemia - Plan: Hemoglobin A1c  Return precautions advised.  Tana Conch, MD

## 2018-09-30 NOTE — Assessment & Plan Note (Signed)
Update CBC and ferritin. No obvious source of active bleeding.

## 2018-09-30 NOTE — Patient Instructions (Signed)
Please stop by lab before you go  No changes today  Please let us know if any worsening symptoms of depression- glad you continue to do better

## 2018-09-30 NOTE — Assessment & Plan Note (Signed)
S: Back in August we increased her Neupro to 3 mg from 2 mg.  We consider updating ferritin level. States improved A/P:  Stable/improved- continue current medicine

## 2018-09-30 NOTE — Assessment & Plan Note (Signed)
S: Patient with mildly elevated PHQ 9 at last visit at 7.  This had improved from previous visits as she had tried to be more engaged with friends and family.  She denied counseling and medication at that visit A/P: counseling provided today- still wants to avoid formal counseling and medication. She will continue to try to remain engaged with friends nad family

## 2018-09-30 NOTE — Assessment & Plan Note (Signed)
Untreated copd. Improved a lot after quitting smoking- declines inhalers  Stable- continue to monitor

## 2018-09-30 NOTE — Assessment & Plan Note (Signed)
S:  controlled on atorvastatin 40 mg.  She is also compliant with Plavix for her atherosclerotic PVD with intermittent claudication- can walk 200 feet or less before getting claudication  Lab Results  Component Value Date   CHOL 179 05/06/2018   HDL 92 05/06/2018   LDLCALC 73 05/06/2018   LDLDIRECT 74.0 05/15/2017   TRIG 68 05/06/2018   CHOLHDL 1.9 05/06/2018   A/P: stable- continue current medications

## 2018-10-01 LAB — IRON,TIBC AND FERRITIN PANEL
%SAT: 17 % (calc) (ref 16–45)
Ferritin: 39 ng/mL (ref 16–288)
Iron: 56 ug/dL (ref 45–160)
TIBC: 325 mcg/dL (calc) (ref 250–450)

## 2018-10-01 NOTE — Progress Notes (Signed)
Iron stores are normal but below 75- below this level makes restless legs more likely - Lets have you take an iron tablet (ferrous sulfate 325 mg twice a week- this should help get the levels higher and may help with the restless legs

## 2018-10-06 DIAGNOSIS — Z79899 Other long term (current) drug therapy: Secondary | ICD-10-CM | POA: Diagnosis not present

## 2018-10-06 DIAGNOSIS — M47896 Other spondylosis, lumbar region: Secondary | ICD-10-CM | POA: Diagnosis not present

## 2018-10-06 DIAGNOSIS — M545 Low back pain: Secondary | ICD-10-CM | POA: Diagnosis not present

## 2018-10-06 DIAGNOSIS — M47817 Spondylosis without myelopathy or radiculopathy, lumbosacral region: Secondary | ICD-10-CM | POA: Diagnosis not present

## 2018-10-06 DIAGNOSIS — G894 Chronic pain syndrome: Secondary | ICD-10-CM | POA: Diagnosis not present

## 2018-10-06 DIAGNOSIS — Z79891 Long term (current) use of opiate analgesic: Secondary | ICD-10-CM | POA: Diagnosis not present

## 2018-12-23 ENCOUNTER — Encounter (INDEPENDENT_AMBULATORY_CARE_PROVIDER_SITE_OTHER): Payer: Medicare Other | Admitting: Ophthalmology

## 2018-12-23 DIAGNOSIS — H43813 Vitreous degeneration, bilateral: Secondary | ICD-10-CM | POA: Diagnosis not present

## 2018-12-23 DIAGNOSIS — I10 Essential (primary) hypertension: Secondary | ICD-10-CM

## 2018-12-23 DIAGNOSIS — H353132 Nonexudative age-related macular degeneration, bilateral, intermediate dry stage: Secondary | ICD-10-CM | POA: Diagnosis not present

## 2018-12-23 DIAGNOSIS — H35033 Hypertensive retinopathy, bilateral: Secondary | ICD-10-CM

## 2018-12-29 ENCOUNTER — Encounter: Payer: Self-pay | Admitting: Family Medicine

## 2018-12-29 ENCOUNTER — Ambulatory Visit (INDEPENDENT_AMBULATORY_CARE_PROVIDER_SITE_OTHER): Payer: Medicare Other | Admitting: Family Medicine

## 2018-12-29 VITALS — BP 128/66 | HR 70 | Temp 98.2°F | Ht 66.0 in | Wt 145.6 lb

## 2018-12-29 DIAGNOSIS — D509 Iron deficiency anemia, unspecified: Secondary | ICD-10-CM

## 2018-12-29 DIAGNOSIS — J438 Other emphysema: Secondary | ICD-10-CM

## 2018-12-29 DIAGNOSIS — R739 Hyperglycemia, unspecified: Secondary | ICD-10-CM | POA: Diagnosis not present

## 2018-12-29 DIAGNOSIS — F3342 Major depressive disorder, recurrent, in full remission: Secondary | ICD-10-CM | POA: Diagnosis not present

## 2018-12-29 DIAGNOSIS — I70219 Atherosclerosis of native arteries of extremities with intermittent claudication, unspecified extremity: Secondary | ICD-10-CM

## 2018-12-29 DIAGNOSIS — I739 Peripheral vascular disease, unspecified: Secondary | ICD-10-CM

## 2018-12-29 MED ORDER — ALBUTEROL SULFATE 108 (90 BASE) MCG/ACT IN AEPB: 2.0000 | INHALATION_SPRAY | Freq: Four times a day (QID) | RESPIRATORY_TRACT | 5 refills | Status: DC | PRN

## 2018-12-29 NOTE — Patient Instructions (Addendum)
Schedule a lab visit at the check out desk within 2 weeks. Return for future fasting labs meaning nothing but water after midnight please. Ok to take your medications with water- would not take metformin on this day though  Trial albuterol/proair respiclick up to every 6 hours if getting winded/short of breath to see if it helps. We can send to mail order if helpful

## 2018-12-29 NOTE — Progress Notes (Signed)
Phone [phone]   Subjective:  Cheryl Atkinson is a 85 y.o. year old very pleasant female patient who presents for/with See problem oriented charting ROS- no chest pain. Shortness of breath stable. Claudication is stable at about 200 feet   Past Medical History-  Patient Active Problem List   Diagnosis Date Noted  . Memory loss 04/11/2016    Priority: High  . Renal artery stenosis (HCC) 09/27/2015    Priority: High  . Claudication (HCC) 05/04/2015    Priority: High  . Atherosclerotic PVD with intermittent claudication (HCC) 04/26/2015    Priority: High  . DNR (do not resuscitate) 09/29/2014    Priority: High  . Insulin resistance 07/19/2014    Priority: High  . LOW BACK PAIN 09/25/2007    Priority: High  . Major depression 03/25/2018    Priority: Medium  . BPPV (benign paroxysmal positional vertigo) 07/12/2016    Priority: Medium  . Hyperlipidemia 06/30/2015    Priority: Medium  . Former smoker 09/29/2014    Priority: Medium  . COPD (chronic obstructive pulmonary disease) (HCC) 09/20/2009    Priority: Medium  . Iron deficiency anemia 11/09/2007    Priority: Medium  . RESTLESS LEG SYNDROME 09/25/2007    Priority: Medium  . Essential hypertension 09/25/2007    Priority: Medium  . GERD 09/25/2007    Priority: Medium  . Osteoporosis 09/25/2007    Priority: Medium  . Cat bite of right hand 12/21/2016    Priority: Low  . Mallet toe of right foot 10/20/2015    Priority: Low  . Tinnitus 12/31/2014    Priority: Low  . Multinodular goiter 08/30/2013    Priority: Low  . Spinal stenosis of lumbar region at multiple levels 09/29/2012    Priority: Low  . HIP PAIN, BILATERAL 07/16/2010    Priority: Low  . CONSTIPATION, CHRONIC 09/20/2009    Priority: Low  . History of UTI 11/09/2007    Priority: Low    Medications- reviewed and updated Current Outpatient Medications  Medication Sig Dispense Refill  . aspirin EC 81 MG EC tablet Take 1 tablet (81 mg total) by mouth  daily.    Marland Kitchen atorvastatin (LIPITOR) 40 MG tablet Take 1 tablet (40 mg total) by mouth daily at 6 PM. 90 tablet 3  . clopidogrel (PLAVIX) 75 MG tablet Take 1 tablet (75 mg total) by mouth daily with breakfast. 90 tablet 3  . CONZIP 300 MG CP24 Take 300 mg by mouth every morning.    . metFORMIN (GLUCOPHAGE) 500 MG tablet TAKE 1 (1) TABLET BY MOUTH DAILY 90 tablet 3  . Multiple Vitamins-Minerals (PRESERVISION AREDS 2 PO) Take 1 tablet by mouth 2 (two) times daily.    . Olmesartan-amLODIPine-HCTZ (TRIBENZOR) 40-10-12.5 MG TABS Take 1 tablet by mouth every morning. 90 tablet 3  . polyethylene glycol (MIRALAX / GLYCOLAX) packet Take 17 g by mouth at bedtime as needed for mild constipation. Reported on 03/14/2016    . rotigotine (NEUPRO) 2 MG/24HR APPLY 1 PATCH TO SKIN DAILY 90 patch 1  . Rotigotine 3 MG/24HR PT24 Place 3 mg onto the skin at bedtime. 90 patch 3  . traMADol (ULTRAM) 50 MG tablet Take 50 mg by mouth at bedtime as needed for moderate pain.    . traMADol (ULTRAM-ER) 300 MG 24 hr tablet Take 300 mg by mouth daily.     No current facility-administered medications for this visit.      Objective:  BP 128/66 (BP Location: Left Arm, Patient Position: Sitting,  Cuff Size: Large)   Pulse 70   Temp 98.2 F (36.8 C) (Oral)   Ht 5\' 6"  (1.676 m)   Wt 145 lb 9.6 oz (66 kg)   LMP  (LMP Unknown)   SpO2 98%   BMI 23.50 kg/m  Gen: NAD, resting comfortably CV: RRR no murmurs rubs or gallops Lungs: CTAB no crackles, wheeze, rhonchi Abdomen: soft/nontender/nondistended Ext: no edema Skin: warm, dry Neuro: speech normal, moves all extremities    Assessment and Plan     #Hypertension S: Compliant with Tribenzor 40-10-12.5 mg.  Blood pressure high in past with caffeine intake A/P: Stable. Continue current medications.      #Major depression S: Patient has denied counseling and medications in the past.  Seems to have been helped with staying more engaged with friends and family. A/P: Improved  with phq9 score of 3 today- has done a reasonable job engaging with her community and things she enjoys like word books- also good for memory. Continue to monitor at least q4-6 months   #Restless leg syndrome S: Patient on Neupro 3 mg.  Ferritin levels have been low in the past- on iron daily. Reports good control A/P: Stable. Continue current medications.      #Hyperlipidemia/peripheral vascular disease/ renal artery stenosis S: Compliant with atorvastatin 40 mg and also on Plavix/ASA per Dr. Allyson Sabal.  She has known atherosclerotic peripheral vascular disease with intermittent claudication-stable with claudication at approximately 200 feet- stable  Patient with right renal artery stenosis on angiogram June 2016.  She is on Plavix and statin. A/P: Stable. Continue current medications. Supposed to be hearing from Dr. Allyson Sabal soon- typically gets ABIs as well as renal ultrasound- she plans to call if doesn't hear by end of february   #Insulin resistance/hyperglycemia S: Patient is compliant with metformin 500 milligrams daily. High sugar intake over Christmas but overall has lost 5 lbs per home scales A/P: hopefully stable or down from last a1c of 6.2- update a1c today     #COPD S: Patient currently not on any medication.  She had improvement in shortness of breath when she quit smoking- still gets winded with activity.  She opts to trial albuterol A/P: stable- trial of albuterol to see if that helps her - can send to mail order if so    No problem-specific Assessment & Plan notes found for this encounter.   Future Appointments  Date Time Provider Department Center  07/14/2019  2:00 PM [facility]  12/24/2019 12:30 PM Sherrie George, MD [facility] None   No follow-ups on file.  Lab/Order associations: had premier/boost type drink this morning No diagnosis found.  No orders of the defined types were placed in this encounter.   Return precautions advised.  Tana Conch, MD

## 2019-01-01 DIAGNOSIS — M47817 Spondylosis without myelopathy or radiculopathy, lumbosacral region: Secondary | ICD-10-CM | POA: Diagnosis not present

## 2019-01-01 DIAGNOSIS — M47896 Other spondylosis, lumbar region: Secondary | ICD-10-CM | POA: Diagnosis not present

## 2019-01-01 DIAGNOSIS — G894 Chronic pain syndrome: Secondary | ICD-10-CM | POA: Diagnosis not present

## 2019-01-08 ENCOUNTER — Other Ambulatory Visit (INDEPENDENT_AMBULATORY_CARE_PROVIDER_SITE_OTHER): Payer: Medicare Other

## 2019-01-08 DIAGNOSIS — I70219 Atherosclerosis of native arteries of extremities with intermittent claudication, unspecified extremity: Secondary | ICD-10-CM | POA: Diagnosis not present

## 2019-01-08 DIAGNOSIS — R739 Hyperglycemia, unspecified: Secondary | ICD-10-CM | POA: Diagnosis not present

## 2019-01-08 DIAGNOSIS — D509 Iron deficiency anemia, unspecified: Secondary | ICD-10-CM

## 2019-01-08 LAB — CBC
HCT: 34.9 % — ABNORMAL LOW (ref 36.0–46.0)
Hemoglobin: 11.3 g/dL — ABNORMAL LOW (ref 12.0–15.0)
MCHC: 32.4 g/dL (ref 30.0–36.0)
MCV: 88.5 fl (ref 78.0–100.0)
Platelets: 270 10*3/uL (ref 150.0–400.0)
RBC: 3.95 Mil/uL (ref 3.87–5.11)
RDW: 14.2 % (ref 11.5–15.5)
WBC: 7.3 10*3/uL (ref 4.0–10.5)

## 2019-01-08 LAB — HEMOGLOBIN A1C: Hgb A1c MFr Bld: 6.1 % (ref 4.6–6.5)

## 2019-01-08 LAB — BASIC METABOLIC PANEL
BUN: 26 mg/dL — ABNORMAL HIGH (ref 6–23)
CO2: 29 mEq/L (ref 19–32)
Calcium: 9.3 mg/dL (ref 8.4–10.5)
Chloride: 103 mEq/L (ref 96–112)
Creatinine, Ser: 0.73 mg/dL (ref 0.40–1.20)
GFR: 75.7 mL/min (ref >60.00)
Glucose, Bld: 96 mg/dL (ref 70–99)
Potassium: 4.4 mEq/L (ref 3.5–5.1)
Sodium: 141 mEq/L (ref 135–145)

## 2019-01-09 LAB — IRON,TIBC AND FERRITIN PANEL
%SAT: 21 % (calc) (ref 16–45)
Ferritin: 42 ng/mL (ref 16–288)
Iron: 64 ug/dL (ref 45–160)
TIBC: 306 mcg/dL (calc) (ref 250–450)

## 2019-01-22 ENCOUNTER — Ambulatory Visit (HOSPITAL_BASED_OUTPATIENT_CLINIC_OR_DEPARTMENT_OTHER)
Admission: RE | Admit: 2019-01-22 | Discharge: 2019-01-22 | Disposition: A | Discharge status: Home / Self Care | Payer: Medicare Other | Source: Ambulatory Visit | Attending: Cardiovascular Disease | Admitting: Cardiovascular Disease

## 2019-01-22 ENCOUNTER — Ambulatory Visit (HOSPITAL_COMMUNITY)
Admission: RE | Admit: 2019-01-22 | Discharge: 2019-01-22 | Disposition: A | Discharge status: Home / Self Care | Payer: Medicare Other | Source: Ambulatory Visit | Attending: Cardiovascular Disease | Admitting: Cardiovascular Disease

## 2019-01-22 DIAGNOSIS — I70219 Atherosclerosis of native arteries of extremities with intermittent claudication, unspecified extremity: Secondary | ICD-10-CM | POA: Diagnosis not present

## 2019-01-22 DIAGNOSIS — I739 Peripheral vascular disease, unspecified: Secondary | ICD-10-CM | POA: Diagnosis not present

## 2019-01-28 ENCOUNTER — Telehealth: Payer: Self-pay

## 2019-01-28 DIAGNOSIS — I739 Peripheral vascular disease, unspecified: Secondary | ICD-10-CM

## 2019-01-28 NOTE — Telephone Encounter (Signed)
Patient aware of results of recent LEA dopplers and scheduled for ROV per Dr. Allyson Sabal request; appt on 3/20

## 2019-01-28 NOTE — Telephone Encounter (Signed)
Pt aware of results of Aorta/ IVC/Iliac dopplers and to repeat in 12 months

## 2019-02-18 ENCOUNTER — Telehealth: Payer: Self-pay | Admitting: *Deleted

## 2019-02-18 NOTE — Telephone Encounter (Signed)
   Cardiac Questionnaire:    Since your last visit or hospitalization:    1. Have you been having new or worsening chest pain? NO   2. Have you been having new or worsening shortness of breath? NO 3. Have you been having new or worsening leg swelling, wt gain, or increase in abdominal girth (pants fitting more tightly)? NO   4. Have you had any passing out spells? NO  PATIENT REPORTS NO NEW OR WORSENING SYMPTOMS OF PAD    *A YES to any of these questions would result in the appointment being kept. *If all the answers to these questions are NO, we should indicate that given the current situation regarding the worldwide coronarvirus pandemic, at the recommendation of the CDC, we are looking to limit gatherings in our waiting area, and thus will reschedule their appointment beyond four weeks from today.   _____________     Primary Cardiologist:  No primary care provider on file.   Patient contacted.  History reviewed.  No symptoms to suggest any unstable cardiac conditions.  Based on discussion, with current pandemic situation, we will be postponing this appointment for @PATIENTNAME @.  If symptoms change, she has been instructed to contact our office.     Deliah Goody, RN  02/18/2019 4:00 PM         .

## 2019-02-19 ENCOUNTER — Ambulatory Visit: Payer: Medicare Other | Admitting: Cardiovascular Disease

## 2019-02-23 DIAGNOSIS — N390 Urinary tract infection, site not specified: Secondary | ICD-10-CM | POA: Diagnosis not present

## 2019-03-31 ENCOUNTER — Ambulatory Visit (INDEPENDENT_AMBULATORY_CARE_PROVIDER_SITE_OTHER): Payer: Medicare Other | Admitting: Family Medicine

## 2019-03-31 ENCOUNTER — Encounter: Payer: Self-pay | Admitting: Family Medicine

## 2019-03-31 VITALS — Ht 66.0 in | Wt 147.0 lb

## 2019-03-31 DIAGNOSIS — Z7189 Other specified counseling: Secondary | ICD-10-CM

## 2019-03-31 DIAGNOSIS — I10 Essential (primary) hypertension: Secondary | ICD-10-CM

## 2019-03-31 DIAGNOSIS — F3342 Major depressive disorder, recurrent, in full remission: Secondary | ICD-10-CM

## 2019-03-31 MED ORDER — ATORVASTATIN CALCIUM 40 MG PO TABS: 40.0000 mg | ORAL_TABLET | Freq: Every day | ORAL | 3 refills | Status: DC

## 2019-03-31 MED ORDER — ROTIGOTINE 3 MG/24HR TD PT24: 3.0000 mg | MEDICATED_PATCH | Freq: Every day | TRANSDERMAL | 3 refills | Status: DC

## 2019-03-31 MED ORDER — ROTIGOTINE 2 MG/24HR TD PT24: MEDICATED_PATCH | TRANSDERMAL | 1 refills | Status: DC

## 2019-03-31 MED ORDER — OLMESARTAN-AMLODIPINE-HCTZ 40-10-12.5 MG PO TABS: 1.0000 | ORAL_TABLET | ORAL | 3 refills | Status: DC

## 2019-03-31 NOTE — Patient Instructions (Addendum)
There are no preventive care reminders to display for this patient.  Phone visit 

## 2019-03-31 NOTE — Progress Notes (Signed)
Phone [phone]   Subjective:  Virtual visit via phonenote This visit type was conducted due to national recommendations for restrictions regarding the COVID-19 Pandemic (e.g. social distancing).  This format is felt to be most appropriate for this patient at this time balancing risks to patient and risks to population by having him in for in person visit.  All issues noted in this document were discussed and addressed.  No physical exam was performed (except for noted visual exam or audio findings with Telehealth visits).  The patient has consented to conduct a Telehealth visit and understands insurance will be billed.   Our team/I connected with Cheryl Atkinson on 03/31/19 at  9:40 AM EDT by phone (patient did not have equipment for webex) and verified that I am speaking with the correct person using two identifiers.  Location patient: [facility] Location provider: [facility], office Persons participating in the virtual visit:  patient  Time on phone: 16 minutes Counseling provided about covid 19, depression  Our team/I discussed the limitations of evaluation and management by telemedicine and the availability of in person appointments. In light of current covid-19 pandemic, patient also understands that we are trying to protect them by minimizing in office contact if at all possible.  The patient expressed consent for telemedicine visit and agreed to proceed. Patient understands insurance will be billed.   [facility] 03/31/2019 12/29/2018 09/30/2018  Decreased Interest 0 1 2  Down, Depressed, Hopeless 0 0 0  PHQ - 2 Score 0 1 2  Altered sleeping 0 0 3  Tired, decreased energy 1 1 1   Change in appetite 1 1 1   Feeling bad or failure about yourself  0 0 0  Trouble concentrating 0 0 0  Moving slowly or fidgety/restless 0 0 0  Suicidal thoughts 0 0 0  PHQ-9 Score 2 3 7   Difficult doing work/chores Not difficult at all Not difficult at all Not difficult at all    ROS- No  chest pain or shortness of breath. No headache or blurry vision. Some balance issues- uses walker   Past Medical History-  Patient Active Problem List   Diagnosis Date Noted  . Memory loss 04/11/2016    Priority: High  . Renal artery stenosis (HCC) 09/27/2015    Priority: High  . Claudication (HCC) 05/04/2015    Priority: High  . Atherosclerotic PVD with intermittent claudication (HCC) 04/26/2015    Priority: High  . DNR (do not resuscitate) 09/29/2014    Priority: High  . Insulin resistance 07/19/2014    Priority: High  . LOW BACK PAIN 09/25/2007    Priority: High  . Major depression 03/25/2018    Priority: Medium  . BPPV (benign paroxysmal positional vertigo) 07/12/2016    Priority: Medium  . Hyperlipidemia 06/30/2015    Priority: Medium  . Former smoker 09/29/2014    Priority: Medium  . COPD (chronic obstructive pulmonary disease) (HCC) 09/20/2009    Priority: Medium  . Iron deficiency anemia 11/09/2007    Priority: Medium  . RESTLESS LEG SYNDROME 09/25/2007    Priority: Medium  . Essential hypertension 09/25/2007    Priority: Medium  . GERD 09/25/2007    Priority: Medium  . Osteoporosis 09/25/2007    Priority: Medium  . Cat bite of right hand 12/21/2016    Priority: Low  . Mallet toe of right foot 10/20/2015    Priority: Low  . Tinnitus 12/31/2014    Priority: Low  . Multinodular goiter 08/30/2013  Priority: Low  . Spinal stenosis of lumbar region at multiple levels 09/29/2012    Priority: Low  . HIP PAIN, BILATERAL 07/16/2010    Priority: Low  . CONSTIPATION, CHRONIC 09/20/2009    Priority: Low  . History of UTI 11/09/2007    Priority: Low    Medications- reviewed and updated Current Outpatient Medications  Medication Sig Dispense Refill  . Albuterol Sulfate (PROAIR RESPICLICK) 108 (90 Base) MCG/ACT AEPB Inhale 2 puffs into the lungs every 6 (six) hours as needed (shortness of breath from COPD). 1 each 5  . aspirin EC 81 MG EC tablet Take 1 tablet  (81 mg total) by mouth daily.    Marland Kitchen atorvastatin (LIPITOR) 40 MG tablet Take 1 tablet (40 mg total) by mouth daily at 6 PM. 90 tablet 3  . clopidogrel (PLAVIX) 75 MG tablet Take 1 tablet (75 mg total) by mouth daily with breakfast. 90 tablet 3  . metFORMIN (GLUCOPHAGE) 500 MG tablet TAKE 1 (1) TABLET BY MOUTH DAILY 90 tablet 3  . Multiple Vitamins-Minerals (PRESERVISION AREDS 2 PO) Take 1 tablet by mouth 2 (two) times daily.    . Olmesartan-amLODIPine-HCTZ (TRIBENZOR) 40-10-12.5 MG TABS Take 1 tablet by mouth every morning. 90 tablet 3  . polyethylene glycol (MIRALAX / GLYCOLAX) packet Take 17 g by mouth at bedtime as needed for mild constipation. Reported on 03/14/2016    . traMADol (ULTRAM) 50 MG tablet Take 50 mg by mouth at bedtime as needed for moderate pain.    . traMADol (ULTRAM-ER) 300 MG 24 hr tablet Take 300 mg by mouth daily.    . Rotigotine 3 MG/24HR PT24 Place 3 mg onto the skin at bedtime. 90 patch 3   No current facility-administered medications for this visit.      Objective:  Ht  (1.676 m)   Wt 147 lb (66.7 kg)   LMP  (LMP Unknown)   BMI 23.73 kg/m  Normal speech, nonlabored    Assessment and Plan   #hypertension S: controlled on last check- does not have cuff btu still taking tribenzor 40-10-12.5mg  BP Readings from Last 3 Encounters:  12/29/18 128/66  09/30/18 136/68  08/18/18 138/68  A/P: likely Stable. Continue current medications. Hope to see each other in person in 3 months  # Major depression S:last visit had improved  With phq9 down to 3. Feels Cheryl Atkinson is still doing reasonable well- but is really looking forward to getting back out of the house- engaging with friends and family had been a big help previously- has declined meds and counseling- using word book and helpful A/P: doing reasonably well- hopeful to see each other in person in 3 months. PHQ9 looks great today- see above  Other notes: 1. covid 19 education- Cheryl Atkinson is staying at home other than going  to the grocery store- wearing mask and santizing well. Son is able to help her.   2. Not driving anymore 3. UTI last month- went to urgent care and that cleared up with antibiotic 4. Cheryl Atkinson drank a lot of cranberry juice at that time- discussed risk with prediabetes of worsening sugar. Cheryl Atkinson is compliant with metformin for hyperglycemia- has seemed to work Lab Results  Component Value Date   HGBA1C 6.1 01/08/2019  5. Uses cane at grocery store, uses walker around otherwise- discussed could use around the house more 6. See more extensive note from 12/29/2018 7. Son has access to smart phone but he is working right now  Keep awv in august- may be  with me Future Appointments  Date Time Provider Department Center  03/31/2019  9:40 AM Shelva Majestic, MD [facility]  07/14/2019  2:00 PM [facility]ALTH COACH [facility]  07/20/2019 10:15 AM Runell Gess, MD [facility]  12/24/2019 12:30 PM Sherrie George, MD [facility] None   Lab/Order associations: Recurrent major depressive disorder, in full remission [facility])  Essential hypertension  Educated About Covid-19 Virus Infection  Meds ordered this encounter  Medications  . atorvastatin (LIPITOR) 40 MG tablet    Sig: Take 1 tablet (40 mg total) by mouth daily at 6 PM.    Dispense:  90 tablet    Refill:  3  . Olmesartan-amLODIPine-HCTZ (TRIBENZOR) 40-10-12.5 MG TABS    Sig: Take 1 tablet by mouth every morning.    Dispense:  90 tablet    Refill:  3  . DISCONTD: rotigotine (NEUPRO) 2 MG/24HR    Sig: APPLY 1 PATCH TO SKIN DAILY    Dispense:  90 patch    Refill:  1  . Rotigotine 3 MG/24HR PT24    Sig: Place 3 mg onto the skin at bedtime.    Dispense:  90 patch    Refill:  3    Please disregard 2 mg patch refill   Return precautions advised.  Tana Conch, MD

## 2019-04-01 DIAGNOSIS — Z79891 Long term (current) use of opiate analgesic: Secondary | ICD-10-CM | POA: Diagnosis not present

## 2019-04-01 DIAGNOSIS — Z79899 Other long term (current) drug therapy: Secondary | ICD-10-CM | POA: Diagnosis not present

## 2019-04-01 DIAGNOSIS — M47896 Other spondylosis, lumbar region: Secondary | ICD-10-CM | POA: Diagnosis not present

## 2019-04-01 DIAGNOSIS — M545 Low back pain: Secondary | ICD-10-CM | POA: Diagnosis not present

## 2019-04-01 DIAGNOSIS — M47817 Spondylosis without myelopathy or radiculopathy, lumbosacral region: Secondary | ICD-10-CM | POA: Diagnosis not present

## 2019-04-01 DIAGNOSIS — G894 Chronic pain syndrome: Secondary | ICD-10-CM | POA: Diagnosis not present

## 2019-04-29 ENCOUNTER — Telehealth: Payer: Self-pay | Admitting: Family Medicine

## 2019-04-29 NOTE — Telephone Encounter (Signed)
See note

## 2019-04-29 NOTE — Telephone Encounter (Unsigned)
Copied from CRM [id number]. Topic: Quick Communication - Rx Refill/Question >> Apr 29, 2019  2:05 PM Wyonia Hough E wrote: Medication: [facility] called to check if the fax was received for the Pts request for an electronic glucose meter and when this can be faxed back/ they would also like the Pts most recent of last chart/office visit notes included with the fax. / please advise

## 2019-04-29 NOTE — Telephone Encounter (Signed)
Noted! Looking for PPW now

## 2019-04-30 ENCOUNTER — Other Ambulatory Visit: Payer: Self-pay

## 2019-04-30 MED ORDER — METFORMIN HCL 500 MG PO TABS: ORAL_TABLET | ORAL | 3 refills | Status: DC

## 2019-04-30 NOTE — Telephone Encounter (Signed)
Spoke to pt and informed her that we have received the PPW. However, PPW is for [employer]. Pt does not have a hx of Dm and was advise insurance more than likely would not cover. Pt was advised that I will still fill out the PPW just in case. Pt verbalized understanding. No further action needed!

## 2019-06-10 ENCOUNTER — Other Ambulatory Visit: Payer: Self-pay | Admitting: Cardiovascular Disease

## 2019-06-11 NOTE — Telephone Encounter (Signed)
Rx(s) sent to pharmacy electronically.  

## 2019-07-01 DIAGNOSIS — G894 Chronic pain syndrome: Secondary | ICD-10-CM | POA: Diagnosis not present

## 2019-07-01 DIAGNOSIS — M47817 Spondylosis without myelopathy or radiculopathy, lumbosacral region: Secondary | ICD-10-CM | POA: Diagnosis not present

## 2019-07-01 DIAGNOSIS — M545 Low back pain: Secondary | ICD-10-CM | POA: Diagnosis not present

## 2019-07-01 DIAGNOSIS — M47896 Other spondylosis, lumbar region: Secondary | ICD-10-CM | POA: Diagnosis not present

## 2019-07-14 ENCOUNTER — Other Ambulatory Visit: Payer: Self-pay

## 2019-07-14 ENCOUNTER — Ambulatory Visit (INDEPENDENT_AMBULATORY_CARE_PROVIDER_SITE_OTHER): Payer: Medicare Other

## 2019-07-14 VITALS — BP 122/68 | Temp 97.9°F | Ht 66.0 in | Wt 146.0 lb

## 2019-07-14 DIAGNOSIS — Z0000 Encounter for general adult medical examination without abnormal findings: Secondary | ICD-10-CM | POA: Diagnosis not present

## 2019-07-14 DIAGNOSIS — R350 Frequency of micturition: Secondary | ICD-10-CM | POA: Diagnosis not present

## 2019-07-14 DIAGNOSIS — R300 Dysuria: Secondary | ICD-10-CM

## 2019-07-14 LAB — POCT URINALYSIS DIPSTICK
Bilirubin, UA: NEGATIVE
Blood, UA: POSITIVE
Glucose, UA: NEGATIVE
Ketones, UA: NEGATIVE
Nitrite, UA: POSITIVE
Protein, UA: NEGATIVE
Spec Grav, UA: 1.02 (ref 1.010–1.025)
Urobilinogen, UA: 0.2 E.U./dL
pH, UA: 6 (ref 5.0–8.0)

## 2019-07-14 MED ORDER — CEPHALEXIN 500 MG PO CAPS: 500.0000 mg | ORAL_CAPSULE | Freq: Three times a day (TID) | ORAL | 0 refills | Status: AC

## 2019-07-14 NOTE — Patient Instructions (Addendum)
Cheryl Atkinson , Thank you for taking time to come for your Medicare Wellness Visit. I appreciate your ongoing commitment to your health goals. Please review the following plan we discussed and let me know if I can assist you in the future.   Screening recommendations/referrals: Colorectal Screening: not indicated  Mammogram: not indicated  Bone Density: last in 2017  Vision and Dental Exams: Recommended annual ophthalmology exams for early detection of glaucoma and other disorders of the eye Recommended annual dental exams for proper oral hygiene  Vaccinations: Influenza vaccine:  recommended this fall either at PCP office or through your local pharmacy  Pneumococcal vaccine: completed 09/29/14 Tdap vaccine:11/27/13 Shingles vaccine: Please call your insurance company to determine your out of pocket expense for the Shingrix vaccine. You may receive this vaccine at your local pharmacy.  Advanced directives: Please bring a copy of your POA (Power of Attorney) and/or Living Will to your next appointment.  Goals: Recommend to decrease portion sizes by eating 3 small healthy meals and at least 2 healthy snacks per day.    Next appointment: Please schedule your Annual Wellness Visit with your Nurse Health Advisor in one year.  Preventive Care 19 Years and Older, Female Preventive care refers to lifestyle choices and visits with your health care provider that can promote health and wellness. What does preventive care include?  A yearly physical exam. This is also called an annual well check.  Dental exams once or twice a year.  Routine eye exams. Ask your health care provider how often you should have your eyes checked.  Personal lifestyle choices, including:  Daily care of your teeth and gums.  Regular physical activity.  Eating a healthy diet.  Avoiding tobacco and drug use.  Limiting alcohol use.  Practicing safe sex.  Taking low-dose aspirin every day if recommended by  your health care provider.  Taking vitamin and mineral supplements as recommended by your health care provider. What happens during an annual well check? The services and screenings done by your health care provider during your annual well check will depend on your age, overall health, lifestyle risk factors, and family history of disease. Counseling  Your health care provider may ask you questions about your:  Alcohol use.  Tobacco use.  Drug use.  Emotional well-being.  Home and relationship well-being.  Sexual activity.  Eating habits.  History of falls.  Memory and ability to understand (cognition).  Work and work Statistician.  Reproductive health. Screening  You may have the following tests or measurements:  Height, weight, and BMI.  Blood pressure.  Lipid and cholesterol levels. These may be checked every 5 years, or more frequently if you are over 13 years old.  Skin check.  Lung cancer screening. You may have this screening every year starting at age 35 if you have a 30-pack-year history of smoking and currently smoke or have quit within the past 15 years.  Fecal occult blood test (FOBT) of the stool. You may have this test every year starting at age 27.  Flexible sigmoidoscopy or colonoscopy. You may have a sigmoidoscopy every 5 years or a colonoscopy every 10 years starting at age 75.  Hepatitis C blood test.  Hepatitis B blood test.  Sexually transmitted disease (STD) testing.  Diabetes screening. This is done by checking your blood sugar (glucose) after you have not eaten for a while (fasting). You may have this done every 1-3 years.  Bone density scan. This is done to screen for osteoporosis.  You may have this done starting at age 58.  Mammogram. This may be done every 1-2 years. Talk to your health care provider about how often you should have regular mammograms. Talk with your health care provider about your test results, treatment options, and  if necessary, the need for more tests. Vaccines  Your health care provider may recommend certain vaccines, such as:  Influenza vaccine. This is recommended every year.  Tetanus, diphtheria, and acellular pertussis (Tdap, Td) vaccine. You may need a Td booster every 10 years.  Zoster vaccine. You may need this after age 70.  Pneumococcal 13-valent conjugate (PCV13) vaccine. One dose is recommended after age 70.  Pneumococcal polysaccharide (PPSV23) vaccine. One dose is recommended after age 16. Talk to your health care provider about which screenings and vaccines you need and how often you need them. This information is not intended to replace advice given to you by your health care provider. Make sure you discuss any questions you have with your health care provider. Document Released: 12/15/2015 Document Revised: 08/07/2016 Document Reviewed: 09/19/2015 Elsevier Interactive Patient Education  2017 [place] Prevention in the Home Falls can cause injuries. They can happen to people of all ages. There are many things you can do to make your home safe and to help prevent falls. What can I do on the outside of my home?  Regularly fix the edges of walkways and driveways and fix any cracks.  Remove anything that might make you trip as you walk through a door, such as a raised step or threshold.  Trim any bushes or trees on the path to your home.  Use bright outdoor lighting.  Clear any walking paths of anything that might make someone trip, such as rocks or tools.  Regularly check to see if handrails are loose or broken. Make sure that both sides of any steps have handrails.  Any raised decks and porches should have guardrails on the edges.  Have any leaves, snow, or ice cleared regularly.  Use sand or salt on walking paths during winter.  Clean up any spills in your garage right away. This includes oil or grease spills. What can I do in the bathroom?  Use night  lights.  Install grab bars by the toilet and in the tub and shower. Do not use towel bars as grab bars.  Use non-skid mats or decals in the tub or shower.  If you need to sit down in the shower, use a plastic, non-slip stool.  Keep the floor dry. Clean up any water that spills on the floor as soon as it happens.  Remove soap buildup in the tub or shower regularly.  Attach bath mats securely with double-sided non-slip rug tape.  Do not have throw rugs and other things on the floor that can make you trip. What can I do in the bedroom?  Use night lights.  Make sure that you have a light by your bed that is easy to reach.  Do not use any sheets or blankets that are too big for your bed. They should not hang down onto the floor.  Have a firm chair that has side arms. You can use this for support while you get dressed.  Do not have throw rugs and other things on the floor that can make you trip. What can I do in the kitchen?  Clean up any spills right away.  Avoid walking on wet floors.  Keep items that you use a  lot in easy-to-reach places.  If you need to reach something above you, use a strong step stool that has a grab bar.  Keep electrical cords out of the way.  Do not use floor polish or wax that makes floors slippery. If you must use wax, use non-skid floor wax.  Do not have throw rugs and other things on the floor that can make you trip. What can I do with my stairs?  Do not leave any items on the stairs.  Make sure that there are handrails on both sides of the stairs and use them. Fix handrails that are broken or loose. Make sure that handrails are as long as the stairways.  Check any carpeting to make sure that it is firmly attached to the stairs. Fix any carpet that is loose or worn.  Avoid having throw rugs at the top or bottom of the stairs. If you do have throw rugs, attach them to the floor with carpet tape.  Make sure that you have a light switch at the  top of the stairs and the bottom of the stairs. If you do not have them, ask someone to add them for you. What else can I do to help prevent falls?  Wear shoes that:  Do not have high heels.  Have rubber bottoms.  Are comfortable and fit you well.  Are closed at the toe. Do not wear sandals.  If you use a stepladder:  Make sure that it is fully opened. Do not climb a closed stepladder.  Make sure that both sides of the stepladder are locked into place.  Ask someone to hold it for you, if possible.  Clearly mark and make sure that you can see:  Any grab bars or handrails.  First and last steps.  Where the edge of each step is.  Use tools that help you move around (mobility aids) if they are needed. These include:  Canes.  Walkers.  Scooters.  Crutches.  Turn on the lights when you go into a dark area. Replace any light bulbs as soon as they burn out.  Set up your furniture so you have a clear path. Avoid moving your furniture around.  If any of your floors are uneven, fix them.  If there are any pets around you, be aware of where they are.  Review your medicines with your doctor. Some medicines can make you feel dizzy. This can increase your chance of falling. Ask your doctor what other things that you can do to help prevent falls. This information is not intended to replace advice given to you by your health care provider. Make sure you discuss any questions you have with your health care provider. Document Released: 09/14/2009 Document Revised: 04/25/2016 Document Reviewed: 12/23/2014 Elsevier Interactive Patient Education  2017 [employer].

## 2019-07-14 NOTE — Addendum Note (Signed)
Addended by: Marin Olp on: 07/14/2019 02:31 PM   Modules accepted: Orders

## 2019-07-14 NOTE — Progress Notes (Signed)
Subjective:   Cheryl Atkinson is a [age] y.o. female who presents for Medicare Annual (Subsequent) preventive examination.  Review of Systems:Cardiac Risk Factors include: advanced age (>2955men, 67>65 women);hypertension    Objective:     Vitals: BP 122/68   Temp 97.9 F (36.6 C)   Ht 5\' 6"  (1.676 m)   Wt 146 lb (66.2 kg)   LMP  (LMP Unknown)   BMI 23.57 kg/m   Body mass index is 23.57 kg/m.  Advanced Directives 07/07/2018 07/05/2017 05/15/2017 12/21/2016 04/29/2016 09/04/2015 05/04/2015  Does Patient Have a Medical Advance Directive? Yes Yes Yes No No Yes Yes  Type of Diplomatic Services operational officerAdvance Directive Healthcare Power of Attorney Living will Healthcare Power of [place]orney;Living will - - Public affairs consultantHealthcare Power of eBayttorney Healthcare Power of [place]auvinAttorney;Living will  Does patient want to make changes to medical advance directive? No - Patient declined No - Patient declined - - - - No - Patient declined  Copy of Healthcare Power of Attorney in Chart? No - copy requested - No - copy requested - - Yes No - copy requested  Would patient like information on creating a medical advance directive? - - - No - Patient declined No - patient declined information - -  Pre-existing out of facility DNR order (yellow form or pink MOST form) - - - - - - -    Tobacco Social History   Tobacco Use  Smoking Status Former Smoker  . Packs/day: 0.50  . Years: 60.00  . Pack years: 30.00  . Types: Cigarettes  . Quit date: 04/02/2015  . Years since quitting: 4.2  Smokeless Tobacco Never Used     Counseling given: Not Answered   Clinical Intake:  Pre-visit preparation completed: Yes  Pain : 0-10 Pain Score: 2  Pain Type: Chronic pain, Acute pain Pain Location: Bladder Pain Descriptors / Indicators: Burning Pain Onset: In the past 7 days  Diabetes: No  How often do you need to have someone help you when you read instructions, pamphlets, or other written materials from your doctor or pharmacy?: 1 - Never  Interpreter Needed?:  No  Comments: accompanied by sister Information entered by :: Kandis Fantasiaourtney  LPN  Past Medical History:  Diagnosis Date  . Anemia   . Arthritis    "shoulders" (05/04/2015)  . Cellulitis of right lower extremity 11/27/2013  . Chronic lower back pain   . Constipation   . GERD (gastroesophageal reflux disease)   . History of hiatal hernia   . Hypertension   . Osteoporosis   . Peripheral arterial disease (HCC)   . Restless leg syndrome   . Thyroid disease    Past Surgical History:  Procedure Laterality Date  . ABDOMINAL AORTAGRAM  05/04/2015   Procedure: Abdominal Aortagram;  Surgeon: Runell GessJonathan J Berry, MD;  [facility];  Service: Cardiovascular;;  . APPENDECTOMY    . CATARACT EXTRACTION W/ INTRAOCULAR LENS  IMPLANT, BILATERAL Bilateral   . I&D EXTREMITY Right 12/22/2016   Procedure: IRRIGATION AND DEBRIDEMENT EXTREMITY;  Surgeon: Mack Hookavid Thompson, MD;  [facility];  Service: Orthopedics;  Laterality: Right;  . PERIPHERAL VASCULAR CATHETERIZATION N/A 05/04/2015   Procedure: Lower Extremity Angiography;  Surgeon: Runell GessJonathan J Berry, MD;  [facility];  Service: Cardiovascular;  Laterality: N/A;  . PERIPHERAL VASCULAR CATHETERIZATION  05/04/2015   Procedure: Peripheral Vascular Intervention;  Surgeon: Runell GessJonathan J Berry, MD;  [facility];  Service: Cardiovascular;;  RCIA - 7x22 ICAST  . PERIPHERAL  VASCULAR CATHETERIZATION Right 09/04/2015   Procedure: Peripheral Vascular Atherectomy;  Surgeon: Lorretta Harp, MD;  Location: [place] CV LAB;  Service: Cardiovascular;  Laterality: Right;  SFA  . sfa Right 09/04/2015   de balloon  . THYROID SURGERY Right ?2013   "had goiter taken off my neck"  . TONSILLECTOMY    . VAGINAL HYSTERECTOMY     Family History  Problem Relation Age of Onset  . Heart disease Father   . Heart attack Father   . Heart disease Mother   . Coronary artery disease Unknown   . Atrial fibrillation Sister   . Stroke  Maternal Grandmother   . Cancer Paternal Grandmother    Social History   Socioeconomic History  . Marital status: Widowed    Spouse name: Not on file  . Number of children: Not on file  . Years of education: Not on file  . Highest education level: Not on file  Occupational History  . Not on file  Social Needs  . Financial resource strain: Not on file  . Food insecurity    Worry: Not on file    Inability: Not on file  . Transportation needs    Medical: Not on file    Non-medical: Not on file  Tobacco Use  . Smoking status: Former Smoker    Packs/day: 0.50    Years: 60.00    Pack years: 30.00    Types: Cigarettes    Quit date: 04/02/2015    Years since quitting: 4.2  . Smokeless tobacco: Never Used  Substance and Sexual Activity  . Alcohol use: No  . Drug use: No  . Sexual activity: Not Currently  Lifestyle  . Physical activity    Days per week: Not on file    Minutes per session: Not on file  . Stress: Not on file  Relationships  . Social Herbalist on phone: Not on file    Gets together: Not on file    Attends religious service: Not on file    Active member of club or organization: Not on file    Attends meetings of clubs or organizations: Not on file    Relationship status: Not on file  Other Topics Concern  . Not on file  Social History Narrative   Widowed 2013. 2 sons. 1 grandchild.    Son can help on weekends. Sister and sister in law could help as well. Thinks she may stop driving in next few years.       Retired from [employer].       Hobbies: time at home and with family      HCPOA: sister-in-law and brother. Margarite Gouge.       DNR/DNI      Regular exercise: none   Caffeine use: cup of coffee     Outpatient Encounter Medications as of 07/14/2019  Medication Sig  . Albuterol Sulfate (PROAIR RESPICLICK) 741 (90 Base) MCG/ACT AEPB Inhale 2 puffs into the lungs every 6 (six) hours as needed (shortness of breath from COPD).  Marland Kitchen aspirin  EC 81 MG EC tablet Take 1 tablet (81 mg total) by mouth daily.  Marland Kitchen atorvastatin (LIPITOR) 40 MG tablet Take 1 tablet (40 mg total) by mouth daily at 6 PM.  . clopidogrel (PLAVIX) 75 MG tablet Take 1 tablet (75 mg total) by mouth daily with breakfast. MUST KEEP APPOINTMENT 07/20/19 WITH DR Gwenlyn Found FOR FUTURE REFILLS  . metFORMIN (GLUCOPHAGE) 500 MG tablet TAKE 1 (  1) TABLET BY MOUTH DAILY  . Multiple Vitamins-Minerals (PRESERVISION AREDS 2 PO) Take 1 tablet by mouth 2 (two) times daily.  . Olmesartan-amLODIPine-HCTZ (TRIBENZOR) 40-10-12.5 MG TABS Take 1 tablet by mouth every morning.  . polyethylene glycol (MIRALAX / GLYCOLAX) packet Take 17 g by mouth at bedtime as needed for mild constipation. Reported on 03/14/2016  . Rotigotine 3 MG/24HR PT24 Place 3 mg onto the skin at bedtime.  . traMADol (ULTRAM) 50 MG tablet Take 50 mg by mouth at bedtime as needed for moderate pain.  . traMADol (ULTRAM-ER) 300 MG 24 hr tablet Take 300 mg by mouth daily.   No facility-administered encounter medications on file as of 07/14/2019.     Activities of Daily Living In your present state of health, do you have any difficulty performing the following activities: 07/14/2019  Hearing? N  Vision? N  Difficulty concentrating or making decisions? N  Walking or climbing stairs? Y  Dressing or bathing? N  Doing errands, shopping? Y  Preparing Food and eating ? N  Using the Toilet? N  In the past six months, have you accidently leaked urine? Y  Do you have problems with loss of bowel control? N  Managing your Medications? N  Managing your Finances? N  Housekeeping or managing your Housekeeping? N  Some recent data might be hidden    Patient Care Team: Shelva MajesticHunter, Stephen O, MD as PCP - General (Family Medicine) Runell GessBerry, Jonathan J, MD as Consulting Physician (Cardiology) Letta KocherSaullo, Thomas, MD as Consulting Physician (Rehabilitation) Sherrie GeorgeMatthews, John D, MD as Consulting Physician (Ophthalmology)    Assessment:   This is a  routine wellness examination for Kathie RhodesBetty.  Exercise Activities and Dietary recommendations Current Exercise Habits: The patient does not participate in regular exercise at present, Exercise limited by: orthopedic condition(s)  Goals    . Increase physical activity     Patient would like to be able to walk more. We discussed her accessing the [facility] Sneakers program at her local [employer]. She has an interest in pursuing this    . Increase water intake     Start drinking at least a full glass of water with medication and at meals.       Fall Risk Fall Risk  07/14/2019 03/31/2019 07/07/2018 06/25/2018 06/23/2017  Falls in the past year? 0 0 Yes Yes Yes  Comment - - - - Emmi Telephone Survey: data to providers prior to load  Number falls in past yr: 0 0 2 or more 1 1  Comment - - - - Emmi Telephone Survey Actual Response = 1  Injury with Fall? 0 0 Yes Yes No  Comment - - Bruising - -  Risk for fall due to : Impaired mobility - Impaired balance/gait Impaired balance/gait -  Risk for fall due to: Comment - - Walks wiht a rolator Uses a Rolator -  Follow up Education provided - - - -   Timed Get Up and Go performed: completed and within normal limits   Depression Screen PHQ 2/9 Scores 07/14/2019 03/31/2019 12/29/2018 09/30/2018  PHQ - 2 Score 0 0 1 2  PHQ- 9 Score - 2 3 7      Cognitive Function MMSE - Mini Mental State Exam 05/15/2017  Orientation to time 5  Orientation to Place 5  Registration 3  Attention/ Calculation 5  Recall 1  Language- name 2 objects 2  Language- repeat 1  Language- follow 3 step command 3  Language- read & follow direction 1  Write a  sentence 1  Copy design 0  Total score 27     6CIT Screen 07/14/2019 07/07/2018  What Year? 0 points 0 points  What month? 0 points 0 points  What time? 0 points 0 points  Count back from 20 0 points 0 points  Months in reverse 0 points 4 points    Immunization History  Administered Date(s) Administered  . Influenza Split  08/20/2011, 08/28/2012, 09/29/2012  . Influenza Whole 09/25/2007, 09/20/2009  . Influenza, High Dose Seasonal PF 09/03/2014, 08/19/2017, 08/18/2018  . Influenza,inj,Quad PF,6+ Mos 08/30/2013, 09/05/2015  . Influenza-Unspecified 08/22/2016  . Pneumococcal Conjugate-13 09/29/2014  . Pneumococcal Polysaccharide-23 12/02/2005  . Td 11/01/1993  . Tdap 11/27/2013  . Zoster 02/17/2007    Qualifies for Shingles Vaccine? Discussed and patient will check with pharmacy for coverage.  Patient education handout provided   Screening Tests Health Maintenance  Topic Date Due  . INFLUENZA VACCINE  07/03/2019  . TETANUS/TDAP  11/28/2023  . DEXA SCAN  Completed  . PNA vac Low Risk Adult  Completed    Cancer Screenings: Breast:  Up to date on Mammogram? Not indicated    Up to date of Bone Density/Dexa? Not indicated; last in 2017 Colorectal: Not indicated; last in 2011     Plan:    I have personally reviewed and addressed the Medicare Annual Wellness questionnaire and have noted the following in the patient's chart:  A. Medical and social history B. Use of alcohol, tobacco or illicit drugs  C. Current medications and supplements D. Functional ability and status E.  Nutritional status F.  Physical activity G. Advance directives H. List of other physicians I.  Hospitalizations, surgeries, and ER visits in previous 12 months J.  Vitals K. Screenings such as hearing and vision if needed, cognitive and depression L. Referrals, records requested, and appointments-none    In addition, I have reviewed and discussed with patient certain preventive protocols, quality metrics, and best practice recommendations. A written personalized care plan for preventive services as well as general preventive health recommendations were provided to patient.   Signed,  Kandis Fantasiaourtney , LPN  Nurse Health Advisor   Nurse Notes: Patient complaining of urinary symptoms x 2 days of dysuria and frequency.   Verbal  orders for urinalysis and urine culture obtained.   Patient aware to check with pharmacy for prescription later in the afternoon

## 2019-07-15 NOTE — Progress Notes (Signed)
I have reviewed and agree with note, evaluation, plan.   Results for orders placed or performed in visit on 07/14/19 (from the past 24 hour(s))  POCT urinalysis dipstick     Status: Abnormal   Collection Time: 07/14/19  2:17 PM  Result Value Ref Range   Color, UA Yellow    Clarity, UA Cloudy    Glucose, UA Negative Negative   Bilirubin, UA Negative    Ketones, UA Negative    Spec Grav, UA 1.020 1.010 - 1.025   Blood, UA Positive    pH, UA 6.0 5.0 - 8.0   Protein, UA Negative Negative   Urobilinogen, UA 0.2 0.2 or 1.0 E.U./dL   Nitrite, UA Positive    Leukocytes, UA Large (3+) (A) Negative   Appearance     Odor yes   Urinalysis concerning for UTI-Keflex was sent in.  Will await urine culture.  Last creatinine appears adequate to tolerate 3 times daily dosing  Garret Reddish, MD

## 2019-07-16 LAB — URINE CULTURE
MICRO NUMBER:: 763985
SPECIMEN QUALITY:: ADEQUATE

## 2019-07-20 ENCOUNTER — Encounter: Payer: Self-pay | Admitting: Cardiovascular Disease

## 2019-07-20 ENCOUNTER — Ambulatory Visit (INDEPENDENT_AMBULATORY_CARE_PROVIDER_SITE_OTHER): Payer: Medicare Other | Admitting: Cardiovascular Disease

## 2019-07-20 ENCOUNTER — Other Ambulatory Visit: Payer: Self-pay

## 2019-07-20 DIAGNOSIS — I70219 Atherosclerosis of native arteries of extremities with intermittent claudication, unspecified extremity: Secondary | ICD-10-CM | POA: Diagnosis not present

## 2019-07-20 DIAGNOSIS — J438 Other emphysema: Secondary | ICD-10-CM

## 2019-07-20 DIAGNOSIS — I10 Essential (primary) hypertension: Secondary | ICD-10-CM | POA: Diagnosis not present

## 2019-07-20 DIAGNOSIS — E782 Mixed hyperlipidemia: Secondary | ICD-10-CM | POA: Diagnosis not present

## 2019-07-20 NOTE — Assessment & Plan Note (Signed)
History of essential hypertension on olmesartan, amlodipine, and hydrochlorothiazide.

## 2019-07-20 NOTE — Assessment & Plan Note (Signed)
History of peripheral arterial disease status post right common iliac stenting by myself 05/04/2015 with an I cast covered stent with a small abdominal aortic aortic aneurysm and total SFAs bilaterally.  She underwent staged right SFA intervention by myself 09/04/2015.  She had recent Dopplers performed 01/22/2019 revealing an occluded right SFA.  She really does have mild lifestyle limiting claudication, walks with a walker but declines invasive work-up at this time.

## 2019-07-20 NOTE — Progress Notes (Signed)
07/20/2019 Cheryl Atkinson   1930/12/24  [id number]  Primary Physician Yong Channel Brayton Mars, MD Primary Cardiologist: Lorretta Harp MD Garret [facility], [place], [place]  HPI:  Cheryl Atkinson is a 85 y.o. African-American female who requiring podiatry procedure on her right foot. She was sent to me by Dr. Jacqualyn Posey for claudication and peripheral vascular disease. I last saw her in the office 04/30/2017. I stented her right common iliac artery back in June. She did have occluded SFAs bilaterally. She underwent staged right SFA directional atherectomy and drug-eluting balloon angioplasty on 09/04/15 with subsequent Dopplers that showed significant improvement in her right ABI. She no longer has claudication on that side. She does have a palpable pedal pulse. Her problems include discontinued tobacco abuse, hypertension and hyperlipidemia.since I saw her lastshe has had an episode of substernal chest pain and was evaluated in the emergency room. It was thought secondary to GERD. She has had Doppler studies of her lower extremities that show a decline in her right ABI with the development of a high-frequency signal in her mid right SFA although she says her back pain limits her more than her legs at this time.Since I saw her a year ago she's remained clinically stable. She denies chest pain does get some shortness of breath especially when singing in church. She does have COPD.  Since I saw her a year ago she is done well.  She does walk with a walker and does get some short shortness of breath.  She does have lifestyle limiting claudication as well.  Recent lower extremity arterial Doppler studies performed 01/22/2019 revealed an occluded right SFA.    Current Meds  Medication Sig  . Albuterol Sulfate (PROAIR RESPICLICK) 301 (90 Base) MCG/ACT AEPB Inhale 2 puffs into the lungs every 6 (six) hours as needed (shortness of breath from COPD).  Marland Kitchen aspirin EC 81 MG EC tablet Take 1 tablet (81 mg total) by mouth  daily.  Marland Kitchen atorvastatin (LIPITOR) 40 MG tablet Take 1 tablet (40 mg total) by mouth daily at 6 PM.  . cephALEXin (KEFLEX) 500 MG capsule Take 1 capsule (500 mg total) by mouth 3 (three) times daily for 7 days.  . clopidogrel (PLAVIX) 75 MG tablet Take 1 tablet (75 mg total) by mouth daily with breakfast. MUST KEEP APPOINTMENT 07/20/19 WITH DR Gwenlyn Found FOR FUTURE REFILLS  . metFORMIN (GLUCOPHAGE) 500 MG tablet TAKE 1 (1) TABLET BY MOUTH DAILY  . Multiple Vitamins-Minerals (PRESERVISION AREDS 2 PO) Take 1 tablet by mouth 2 (two) times daily.  . Olmesartan-amLODIPine-HCTZ (TRIBENZOR) 40-10-12.5 MG TABS Take 1 tablet by mouth every morning.  . polyethylene glycol (MIRALAX / GLYCOLAX) packet Take 17 g by mouth at bedtime as needed for mild constipation. Reported on 03/14/2016  . Rotigotine 3 MG/24HR PT24 Place 3 mg onto the skin at bedtime.  . traMADol (ULTRAM) 50 MG tablet Take 50 mg by mouth at bedtime as needed for moderate pain.  . traMADol (ULTRAM-ER) 300 MG 24 hr tablet Take 300 mg by mouth daily.     Allergies  Allergen Reactions  . Codeine Other (See Comments)    REACTION: Syncope     Social History   Socioeconomic History  . Marital status: Widowed    Spouse name: Not on file  . Number of children: Not on file  . Years of education: Not on file  . Highest education level: Not on file  Occupational History  . Not on file  Social Needs  .  Financial resource strain: Not on file  . Food insecurity    Worry: Not on file    Inability: Not on file  . Transportation needs    Medical: Not on file    Non-medical: Not on file  Tobacco Use  . Smoking status: Former Smoker    Packs/day: 0.50    Years: 60.00    Pack years: 30.00    Types: Cigarettes    Quit date: 04/02/2015    Years since quitting: 4.3  . Smokeless tobacco: Never Used  Substance and Sexual Activity  . Alcohol use: No  . Drug use: No  . Sexual activity: Not Currently  Lifestyle  . Physical activity    Days per week:  Not on file    Minutes per session: Not on file  . Stress: Not on file  Relationships  . Social Musicianconnections    Talks on phone: Not on file    Gets together: Not on file    Attends religious service: Not on file    Active member of club or organization: Not on file    Attends meetings of clubs or organizations: Not on file    Relationship status: Not on file  . Intimate partner violence    Fear of current or ex partner: Not on file    Emotionally abused: Not on file    Physically abused: Not on file    Forced sexual activity: Not on file  Other Topics Concern  . Not on file  Social History Narrative   Widowed 2013. 2 sons. 1 grandchild.    Son can help on weekends. Sister and sister in law could help as well. Thinks she may stop driving in next few years.       Retired from [employer].       Hobbies: time at home and with family      HCPOA: sister-in-law and brother. Deirdre EvenerStephen Sumner.       DNR/DNI      Regular exercise: none   Caffeine use: cup of coffee      Review of Systems: General: negative for chills, fever, night sweats or weight changes.  Cardiovascular: negative for chest pain, dyspnea on exertion, edema, orthopnea, palpitations, paroxysmal nocturnal dyspnea or shortness of breath Dermatological: negative for rash Respiratory: negative for cough or wheezing Urologic: negative for hematuria Abdominal: negative for nausea, vomiting, diarrhea, bright red blood per rectum, melena, or hematemesis Neurologic: negative for visual changes, syncope, or dizziness All other systems reviewed and are otherwise negative except as noted above.    Blood pressure (!) 148/73, pulse 66, height 5\' 6"  (1.676 m), weight 147 lb (66.7 kg), SpO2 94 %.  General appearance: alert and no distress Neck: no adenopathy, no carotid bruit, no JVD, supple, symmetrical, trachea midline and thyroid not enlarged, symmetric, no tenderness/mass/nodules Lungs: clear to auscultation bilaterally  Heart: regular rate and rhythm, S1, S2 normal, no murmur, click, rub or gallop Extremities: extremities normal, atraumatic, no cyanosis or edema Pulses: Diminished pedal pulses bilaterally Skin: Skin color, texture, turgor normal. No rashes or lesions Neurologic: Alert and oriented X 3, normal strength and tone. Normal symmetric reflexes. Normal coordination and gait  EKG sinus rhythm at 66 without ST or T wave changes.  I personally reviewed this EKG.  ASSESSMENT AND PLAN:   Essential hypertension History of essential hypertension on olmesartan, amlodipine, and hydrochlorothiazide.  COPD (chronic obstructive pulmonary disease) (HCC) History of COPD on inhaled bronchodilators  Atherosclerotic PVD with intermittent claudication (HCC)  History of peripheral arterial disease status post right common iliac stenting by myself 05/04/2015 with an I cast covered stent with a small abdominal aortic aortic aneurysm and total SFAs bilaterally.  She underwent staged right SFA intervention by myself 09/04/2015.  She had recent Dopplers performed 01/22/2019 revealing an occluded right SFA.  She really does have mild lifestyle limiting claudication, walks with a walker but declines invasive work-up at this time.  Hyperlipidemia History of hyperlipidemia on statin therapy with lipid profile performed 05/06/2018 revealing total cholesterol 179, LDL 73 and HDL 92.      Runell GessJonathan J. Januel Doolan MD [facility] LPFACP,FACC,FAHA, [facility] Campus[facility]SCAI 07/20/2019 11:07 AM

## 2019-07-20 NOTE — Patient Instructions (Signed)
Your physician recommends that you schedule a follow-up appointment in: [place].

## 2019-07-20 NOTE — Assessment & Plan Note (Signed)
History of COPD on inhaled bronchodilators

## 2019-07-20 NOTE — Assessment & Plan Note (Signed)
History of hyperlipidemia on statin therapy with lipid profile performed 05/06/2018 revealing total cholesterol 179, LDL 73 and HDL 92.

## 2019-08-20 ENCOUNTER — Other Ambulatory Visit: Payer: Self-pay

## 2019-08-20 ENCOUNTER — Encounter (HOSPITAL_COMMUNITY): Payer: Self-pay

## 2019-08-20 ENCOUNTER — Ambulatory Visit (HOSPITAL_COMMUNITY)
Admission: EM | Admit: 2019-08-20 | Discharge: 2019-08-20 | Disposition: A | Discharge status: Home / Self Care | Payer: Medicare Other | Attending: Family Medicine | Admitting: Family Medicine

## 2019-08-20 DIAGNOSIS — N390 Urinary tract infection, site not specified: Secondary | ICD-10-CM | POA: Diagnosis not present

## 2019-08-20 LAB — POCT URINALYSIS DIP (DEVICE)
Bilirubin Urine: NEGATIVE
Glucose, UA: NEGATIVE mg/dL
Hgb urine dipstick: NEGATIVE
Ketones, ur: NEGATIVE mg/dL
Nitrite: NEGATIVE
Protein, ur: NEGATIVE mg/dL
Specific Gravity, Urine: 1.025 (ref 1.005–1.030)
Urobilinogen, UA: 0.2 mg/dL (ref 0.0–1.0)
pH: 6 (ref 5.0–8.0)

## 2019-08-20 MED ORDER — CIPROFLOXACIN HCL 250 MG PO TABS: 250.0000 mg | ORAL_TABLET | Freq: Two times a day (BID) | ORAL | 0 refills | Status: DC

## 2019-08-20 NOTE — Discharge Instructions (Addendum)
Drink plenty of water Take the antibiotic 2 x a day for 5 days Call for problems

## 2019-08-20 NOTE — ED Provider Notes (Signed)
[place]    CSN: [id number] Arrival date & time: 08/20/19  1635      History   Chief Complaint Chief Complaint  Patient presents with  . Dysuria    HPI Cheryl Atkinson is a 85 y.o. female.   HPI  Patient is here for recurring urinary tract infection.  She is having dysuria, frequency, and some lower abdominal pressure.  She also has a headache.  She states that these symptoms are typical for a bladder infection for her.  When she had her last bladder infection she was treated with Keflex.  It felt like it helped her, but it did upset her stomach.  I did review her last urinalysis and she grew Klebsiella with multiple resistance.  It looks like it should have been sensitive to Keflex, however.  We will reculture her urine. No flank pain.  No fever.  Past Medical History:  Diagnosis Date  . Anemia   . Arthritis    "shoulders" (05/04/2015)  . Cellulitis of right lower extremity 11/27/2013  . Chronic lower back pain   . Constipation   . GERD (gastroesophageal reflux disease)   . History of hiatal hernia   . Hypertension   . Osteoporosis   . Peripheral arterial disease ([place])   . Restless leg syndrome   . Thyroid disease     Patient Active Problem List   Diagnosis Date Noted  . Major depression 03/25/2018  . Cat bite of right hand 12/21/2016  . BPPV (benign paroxysmal positional vertigo) 07/12/2016  . Memory loss 04/11/2016  . Mallet toe of right foot 10/20/2015  . Renal artery stenosis ([place]) 09/27/2015  . Hyperlipidemia 06/30/2015  . Claudication ([place]) 05/04/2015  . Atherosclerotic PVD with intermittent claudication ([place]) 04/26/2015  . Tinnitus 12/31/2014  . Former smoker 09/29/2014  . DNR (do not resuscitate) 09/29/2014  . Insulin resistance 07/19/2014  . Multinodular goiter 08/30/2013  . Spinal stenosis of lumbar region at multiple levels 09/29/2012  . HIP PAIN, BILATERAL 07/16/2010  . COPD (chronic obstructive pulmonary disease) ([place]) 09/20/2009  .  CONSTIPATION, CHRONIC 09/20/2009  . Iron deficiency anemia 11/09/2007  . History of UTI 11/09/2007  . RESTLESS LEG SYNDROME 09/25/2007  . Essential hypertension 09/25/2007  . GERD 09/25/2007  . LOW BACK PAIN 09/25/2007  . Osteoporosis 09/25/2007    Past Surgical History:  Procedure Laterality Date  . ABDOMINAL AORTAGRAM  05/04/2015   Procedure: Abdominal Aortagram;  Surgeon: Lorretta Harp, MD;  Location: [facility];  Service: Cardiovascular;;  . APPENDECTOMY    . CATARACT EXTRACTION W/ INTRAOCULAR LENS  IMPLANT, BILATERAL Bilateral   . I&D EXTREMITY Right 12/22/2016   Procedure: IRRIGATION AND DEBRIDEMENT EXTREMITY;  Surgeon: Milly Jakob, MD;  Location: [facility];  Service: Orthopedics;  Laterality: Right;  . PERIPHERAL VASCULAR CATHETERIZATION N/A 05/04/2015   Procedure: Lower Extremity Angiography;  Surgeon: Lorretta Harp, MD;  Location: [facility];  Service: Cardiovascular;  Laterality: N/A;  . PERIPHERAL VASCULAR CATHETERIZATION  05/04/2015   Procedure: Peripheral Vascular Intervention;  Surgeon: Lorretta Harp, MD;  Location: [facility];  Service: Cardiovascular;;  RCIA - 7x22 ICAST  . PERIPHERAL VASCULAR CATHETERIZATION Right 09/04/2015   Procedure: Peripheral Vascular Atherectomy;  Surgeon: Lorretta Harp, MD;  Location: [facility];  Service: Cardiovascular;  Laterality: Right;  SFA  . sfa Right 09/04/2015   de balloon  . THYROID SURGERY Right ?2013   "had goiter taken off my neck"  .  TONSILLECTOMY    . VAGINAL HYSTERECTOMY      OB History   No obstetric history on file.      Home Medications    Prior to Admission medications   Medication Sig Start Date End Date Taking? Authorizing Provider  Albuterol Sulfate (PROAIR RESPICLICK) 108 (90 Base) MCG/ACT AEPB Inhale 2 puffs into the lungs every 6 (six) hours as needed (shortness of breath from COPD). 12/29/18   Shelva MajesticHunter, Stephen O, MD  aspirin EC 81 MG EC tablet Take 1 tablet (81 mg total)  by mouth daily. 05/05/15   Abelino DerrickKilroy, Luke K, PA-C  atorvastatin (LIPITOR) 40 MG tablet Take 1 tablet (40 mg total) by mouth daily at 6 PM. 03/31/19   Shelva MajesticHunter, Stephen O, MD  ciprofloxacin (CIPRO) 250 MG tablet Take 1 tablet (250 mg total) by mouth every 12 (twelve) hours. 08/20/19   Eustace MooreNelson, Serenity Batley Sue, MD  clopidogrel (PLAVIX) 75 MG tablet Take 1 tablet (75 mg total) by mouth daily with breakfast. MUST KEEP APPOINTMENT 07/20/19 WITH DR Allyson SabalBERRY FOR FUTURE REFILLS 06/11/19   Runell GessBerry, Jonathan J, MD  metFORMIN (GLUCOPHAGE) 500 MG tablet TAKE 1 (1) TABLET BY MOUTH DAILY 04/30/19   Shelva MajesticHunter, Stephen O, MD  Multiple Vitamins-Minerals (PRESERVISION AREDS 2 PO) Take 1 tablet by mouth 2 (two) times daily.    [provider]  Olmesartan-amLODIPine-HCTZ (TRIBENZOR) 40-10-12.5 MG TABS Take 1 tablet by mouth every morning. 03/31/19   Shelva MajesticHunter, Stephen O, MD  polyethylene glycol [facility](MIRALAX / Ethelene HalGLYCOLAX) packet Take 17 g by mouth at bedtime as needed for mild constipation. Reported on 03/14/2016    [provider]  Rotigotine 3 MG/24HR PT24 Place 3 mg onto the skin at bedtime. 03/31/19   Shelva MajesticHunter, Stephen O, MD  traMADol (ULTRAM) 50 MG tablet Take 50 mg by mouth at bedtime as needed for moderate pain.    [provider]  traMADol (ULTRAM-ER) 300 MG 24 hr tablet Take 300 mg by mouth daily.    [provider]    Family History Family History  Problem Relation Age of Onset  . Heart disease Father   . Heart attack Father   . Heart disease Mother   . Coronary artery disease Other   . Atrial fibrillation Sister   . Stroke Maternal Grandmother   . Cancer Paternal Grandmother     Social History Social History   Tobacco Use  . Smoking status: Former Smoker    Packs/day: 0.50    Years: 60.00    Pack years: 30.00    Types: Cigarettes    Quit date: 04/02/2015    Years since quitting: 4.3  . Smokeless tobacco: Never Used  Substance Use Topics  . Alcohol use: No  . Drug use: No     Allergies    Codeine   Review of Systems Review of Systems  Constitutional: Negative for chills and fever.  HENT: Negative for ear pain and sore throat.   Eyes: Negative for pain and visual disturbance.  Respiratory: Negative for cough and shortness of breath.   Cardiovascular: Negative for chest pain and palpitations.  Gastrointestinal: Negative for abdominal pain and vomiting.  Genitourinary: Positive for dysuria, frequency and pelvic pain. Negative for hematuria.  Musculoskeletal: Negative for arthralgias and back pain.  Skin: Negative for color change and rash.  Neurological: Positive for headaches. Negative for seizures and syncope.  All other systems reviewed and are negative.    Physical Exam Triage Vital Signs ED Triage Vitals  Enc Vitals Group  BP 08/20/19 1718 135/72     Pulse Rate 08/20/19 1718 73     Resp 08/20/19 1718 15     Temp 08/20/19 1718 (!) 97.3 F (36.3 C)     Temp Source 08/20/19 1718 Temporal     SpO2 08/20/19 1718 96 %     Weight --      Height --      Head Circumference --      Peak Flow --      Pain Score 08/20/19 1713 5     Pain Loc --      Pain Edu? --      Excl. in GC? --    No data found.  Updated Vital Signs BP 135/72 (BP Location: Right Arm)   Pulse 73   Temp (!) 97.3 F (36.3 C) (Temporal)   Resp 15   LMP  (LMP Unknown)   SpO2 96%       Physical Exam Constitutional:      General: She is not in acute distress.    Appearance: She is well-developed and normal weight.     Comments: Pleasant.  Appears stated age.  Cane with ambulation  HENT:     Head: Normocephalic and atraumatic.  Eyes:     Conjunctiva/sclera: Conjunctivae normal.     Pupils: Pupils are equal, round, and reactive to light.  Neck:     Musculoskeletal: Normal range of motion.  Cardiovascular:     Rate and Rhythm: Normal rate.  Pulmonary:     Effort: Pulmonary effort is normal. No respiratory distress.  Abdominal:     General: There is no distension.      Palpations: Abdomen is soft.     Tenderness: There is no abdominal tenderness. There is no right CVA tenderness or left CVA tenderness.  Musculoskeletal: Normal range of motion.  Skin:    General: Skin is warm and dry.  Neurological:     Mental Status: She is alert.      UC Treatments / Results  Labs (all labs ordered are listed, but only abnormal results are displayed) Labs Reviewed  POCT URINALYSIS DIP (DEVICE) - Abnormal; Notable for the following components:      Result Value   Leukocytes,Ua TRACE (*)    All other components within normal limits    EKG   Radiology No results found.  Procedures Procedures (including critical care time)  Medications Ordered in UC Medications - No data to display  Initial Impression / Assessment and Plan / UC Course  I have reviewed the triage vital signs and the nursing notes.  Pertinent labs & imaging results that were available during my care of the patient were reviewed by me and considered in my medical decision making (see chart for details).     Urine culture is sent.  Discussed UTI Final Clinical Impressions(s) / UC Diagnoses   Final diagnoses:  Lower urinary tract infectious disease     Discharge Instructions     Drink plenty of water Take the antibiotic 2 x a day for 5 days Call for problems   ED Prescriptions    Medication Sig Dispense Auth. Provider   ciprofloxacin (CIPRO) 250 MG tablet Take 1 tablet (250 mg total) by mouth every 12 (twelve) hours. 10 tablet Eustace Moore, MD     PDMP not reviewed this encounter.   Eustace Moore, MD 08/20/19 2034

## 2019-08-20 NOTE — ED Triage Notes (Signed)
Patient reports she is having dysuria, abdominal pain and headache for 1 day.

## 2019-09-30 DIAGNOSIS — G894 Chronic pain syndrome: Secondary | ICD-10-CM | POA: Diagnosis not present

## 2019-09-30 DIAGNOSIS — Z79899 Other long term (current) drug therapy: Secondary | ICD-10-CM | POA: Diagnosis not present

## 2019-09-30 DIAGNOSIS — M47817 Spondylosis without myelopathy or radiculopathy, lumbosacral region: Secondary | ICD-10-CM | POA: Diagnosis not present

## 2019-09-30 DIAGNOSIS — M545 Low back pain: Secondary | ICD-10-CM | POA: Diagnosis not present

## 2019-09-30 DIAGNOSIS — M47896 Other spondylosis, lumbar region: Secondary | ICD-10-CM | POA: Diagnosis not present

## 2019-10-05 ENCOUNTER — Other Ambulatory Visit: Payer: Self-pay

## 2019-10-05 ENCOUNTER — Ambulatory Visit (INDEPENDENT_AMBULATORY_CARE_PROVIDER_SITE_OTHER): Payer: Medicare Other | Admitting: Family Medicine

## 2019-10-05 ENCOUNTER — Encounter: Payer: Self-pay | Admitting: Family Medicine

## 2019-10-05 VITALS — BP 130/72 | HR 72 | Temp 97.6°F | Ht 66.0 in | Wt 145.2 lb

## 2019-10-05 DIAGNOSIS — R269 Unspecified abnormalities of gait and mobility: Secondary | ICD-10-CM | POA: Diagnosis not present

## 2019-10-05 DIAGNOSIS — I70219 Atherosclerosis of native arteries of extremities with intermittent claudication, unspecified extremity: Secondary | ICD-10-CM

## 2019-10-05 DIAGNOSIS — E782 Mixed hyperlipidemia: Secondary | ICD-10-CM

## 2019-10-05 DIAGNOSIS — F3342 Major depressive disorder, recurrent, in full remission: Secondary | ICD-10-CM

## 2019-10-05 DIAGNOSIS — I739 Peripheral vascular disease, unspecified: Secondary | ICD-10-CM

## 2019-10-05 DIAGNOSIS — E8881 Metabolic syndrome: Secondary | ICD-10-CM

## 2019-10-05 DIAGNOSIS — G2581 Restless legs syndrome: Secondary | ICD-10-CM

## 2019-10-05 DIAGNOSIS — Z9181 History of falling: Secondary | ICD-10-CM

## 2019-10-05 DIAGNOSIS — R739 Hyperglycemia, unspecified: Secondary | ICD-10-CM | POA: Diagnosis not present

## 2019-10-05 DIAGNOSIS — I10 Essential (primary) hypertension: Secondary | ICD-10-CM

## 2019-10-05 DIAGNOSIS — I701 Atherosclerosis of renal artery: Secondary | ICD-10-CM

## 2019-10-05 LAB — LIPID PANEL
Cholesterol: 237 mg/dL — ABNORMAL HIGH (ref 0–200)
HDL: 92 mg/dL (ref >39.00)
LDL Cholesterol: 129 mg/dL — ABNORMAL HIGH (ref 0–99)
NonHDL: 144.8
Total CHOL/HDL Ratio: 3
Triglycerides: 79 mg/dL (ref 0.0–149.0)
VLDL: 15.8 mg/dL (ref 0.0–40.0)

## 2019-10-05 LAB — CBC WITH DIFFERENTIAL/PLATELET
Basophils Absolute: 0.1 10*3/uL (ref 0.0–0.1)
Basophils Relative: 0.8 % (ref 0.0–3.0)
Eosinophils Absolute: 0.2 10*3/uL (ref 0.0–0.7)
Eosinophils Relative: 2.3 % (ref 0.0–5.0)
HCT: 35.7 % — ABNORMAL LOW (ref 36.0–46.0)
Hemoglobin: 11.7 g/dL — ABNORMAL LOW (ref 12.0–15.0)
Lymphocytes Relative: 26 % (ref 12.0–46.0)
Lymphs Abs: 2.2 10*3/uL (ref 0.7–4.0)
MCHC: 32.9 g/dL (ref 30.0–36.0)
MCV: 88.9 fl (ref 78.0–100.0)
Monocytes Absolute: 0.7 10*3/uL (ref 0.1–1.0)
Monocytes Relative: 8 % (ref 3.0–12.0)
Neutro Abs: 5.4 10*3/uL (ref 1.4–7.7)
Neutrophils Relative %: 62.9 % (ref 43.0–77.0)
Platelets: 261 10*3/uL (ref 150.0–400.0)
RBC: 4.01 Mil/uL (ref 3.87–5.11)
RDW: 13.4 % (ref 11.5–15.5)
WBC: 8.6 10*3/uL (ref 4.0–10.5)

## 2019-10-05 LAB — COMPREHENSIVE METABOLIC PANEL
ALT: 11 U/L (ref 0–35)
AST: 17 U/L (ref 0–37)
Albumin: 4.3 g/dL (ref 3.5–5.2)
Alkaline Phosphatase: 109 U/L (ref 39–117)
BUN: 24 mg/dL — ABNORMAL HIGH (ref 6–23)
CO2: 28 mEq/L (ref 19–32)
Calcium: 9.5 mg/dL (ref 8.4–10.5)
Chloride: 100 mEq/L (ref 96–112)
Creatinine, Ser: 0.75 mg/dL (ref 0.40–1.20)
GFR: 73.25 mL/min (ref >60.00)
Glucose, Bld: 82 mg/dL (ref 70–99)
Potassium: 4.2 mEq/L (ref 3.5–5.1)
Sodium: 140 mEq/L (ref 135–145)
Total Bilirubin: 0.5 mg/dL (ref 0.2–1.2)
Total Protein: 7.3 g/dL (ref 6.0–8.3)

## 2019-10-05 MED ORDER — OLMESARTAN-AMLODIPINE-HCTZ 40-10-12.5 MG PO TABS: 1.0000 | ORAL_TABLET | ORAL | 3 refills | Status: DC

## 2019-10-05 MED ORDER — METFORMIN HCL 500 MG PO TABS: ORAL_TABLET | ORAL | 3 refills | Status: DC

## 2019-10-05 MED ORDER — ATORVASTATIN CALCIUM 40 MG PO TABS: 40.0000 mg | ORAL_TABLET | Freq: Every day | ORAL | 3 refills | Status: DC

## 2019-10-05 NOTE — Patient Instructions (Addendum)
Health Maintenance Due  Topic Date Due  . INFLUENZA VACCINE -09/16/2019 07/03/2019   We will call you within two weeks about your referral to home health for physical therapy and occupational therapy. If you do not hear within 3 weeks, give [place] a call.   Start ferrous sulfate 325 mg about twice a week to see if we can get iron levels up and that may help with restless legs.   Please stop by lab before you go If you do not have mychart- we will call you about results within 5 business days of [place] receiving them.  If you have mychart- we will send your results within 3 business days of [place] receiving them.  If abnormal or we want to clarify a result, we will call or mychart you to make sure you receive the message.  If you have questions or concerns or don't hear within 5-7 days, please send [place] a message or call us.

## 2019-10-05 NOTE — Progress Notes (Signed)
Phone [phone]  Subjective:  In person visit Cheryl ReeseBetty L Atkinson is a 85 y.o. year old very pleasant female patient who presents for/with See problem oriented charting Chief Complaint  Patient presents with  . Follow-up  . falls     ROS-  Claudication noted, imbalance issues. No chest pain. Shortness of breath at times and improves with albuterol   Past Medical History-  Patient Active Problem List   Diagnosis Date Noted  . Memory loss 04/11/2016    Priority: High  . Renal artery stenosis (HCC) 09/27/2015    Priority: High  . Claudication (HCC) 05/04/2015    Priority: High  . Atherosclerotic PVD with intermittent claudication (HCC) 04/26/2015    Priority: High  . DNR (do not resuscitate) 09/29/2014    Priority: High  . Insulin resistance 07/19/2014    Priority: High  . LOW BACK PAIN 09/25/2007    Priority: High  . Major depression 03/25/2018    Priority: Medium  . BPPV (benign paroxysmal positional vertigo) 07/12/2016    Priority: Medium  . Hyperlipidemia 06/30/2015    Priority: Medium  . Former smoker 09/29/2014    Priority: Medium  . COPD (chronic obstructive pulmonary disease) (HCC) 09/20/2009    Priority: Medium  . Iron deficiency anemia 11/09/2007    Priority: Medium  . RESTLESS LEG SYNDROME 09/25/2007    Priority: Medium  . Essential hypertension 09/25/2007    Priority: Medium  . GERD 09/25/2007    Priority: Medium  . Osteoporosis 09/25/2007    Priority: Medium  . Cat bite of right hand 12/21/2016    Priority: Low  . Mallet toe of right foot 10/20/2015    Priority: Low  . Tinnitus 12/31/2014    Priority: Low  . Multinodular goiter 08/30/2013    Priority: Low  . Spinal stenosis of lumbar region at multiple levels 09/29/2012    Priority: Low  . HIP PAIN, BILATERAL 07/16/2010    Priority: Low  . CONSTIPATION, CHRONIC 09/20/2009    Priority: Low  . History of UTI 11/09/2007    Priority: Low    Medications- reviewed and updated Current  Outpatient Medications  Medication Sig Dispense Refill  . Albuterol Sulfate (PROAIR RESPICLICK) 108 (90 Base) MCG/ACT AEPB Inhale 2 puffs into the lungs every 6 (six) hours as needed (shortness of breath from COPD). 1 each 5  . aspirin EC 81 MG EC tablet Take 1 tablet (81 mg total) by mouth daily.    . ciprofloxacin (CIPRO) 250 MG tablet Take 1 tablet (250 mg total) by mouth every 12 (twelve) hours. 10 tablet 0  . clopidogrel (PLAVIX) 75 MG tablet Take 1 tablet (75 mg total) by mouth daily with breakfast. MUST KEEP APPOINTMENT 07/20/19 WITH DR BERRY FOR FUTURE REFILLS 90 tablet 0  . metFORMIN (GLUCOPHAGE) 500 MG tablet TAKE 1 (1) TABLET BY MOUTH DAILY 90 tablet 3  . Multiple Vitamins-Minerals (PRESERVISION AREDS 2 PO) Take 1 tablet by mouth 2 (two) times daily.    . Olmesartan-amLODIPine-HCTZ (TRIBENZOR) 40-10-12.5 MG TABS Take 1 tablet by mouth every morning. 90 tablet 3  . polyethylene glycol (MIRALAX / GLYCOLAX) packet Take 17 g by mouth at bedtime as needed for mild constipation. Reported on 03/14/2016    . Rotigotine 3 MG/24HR PT24 Place 3 mg onto the skin at bedtime. 90 patch 3  . traMADol (ULTRAM) 50 MG tablet Take 50 mg by mouth at bedtime as needed for moderate pain.    . traMADol (ULTRAM-ER) 300 MG 24 hr tablet Take  300 mg by mouth daily.    Marland Kitchen atorvastatin (LIPITOR) 40 MG tablet Take 1 tablet (40 mg total) by mouth daily at 6 PM. 90 tablet 3   No current facility-administered medications for this visit.      Objective:  BP 130/72   Pulse 72   Temp 97.6 F (36.4 C)   Ht 5\' 6"  (1.676 m)   Wt 145 lb 3.2 oz (65.9 kg)   LMP  (LMP Unknown)   SpO2 99%   BMI 23.44 kg/m  Gen: NAD, resting comfortably CV: RRR no murmurs rubs or gallops Lungs: CTAB no crackles, wheeze, rhonchi Abdomen: soft/nontender/nondistended/normal bowel sounds. Ext: no edema Skin: warm, dry Neuro: walks with walker     Assessment and Plan  Falls S:Pt states she has been falling backward due to her balance  being off. She has fallen twice in the past 3 months.  A/P: she is using walker some of the time- encouraged to use at all times. Will do home health PT/OT eval for home safety eval as well as gait and balance training/strengthening exercises.    # Possible recurrent UTi S:had UTI in august then again in September  . Drinks 1 coke and 1 water each day.  A/P:  We will need to get culture with next possible UTI- hopefully we can avoid with increased hydration- if has recurrent UTI- she wants to consider prophylactic antibiotics if meets qualifications.     #Hypertension S: Compliant with Tribenzor 40-10-12 0.5 mg.  Blood pressure high in past with caffeine intake. Controlled today A/P:   Stable. Continue current medications.    #Major depression S: Patient has denied counseling and medications in the past.  Seems to have been helped with staying more engaged with friends and family.  PHQ9 reasonably controlled at 5 with no anhedonia or depressed mood A/P: stable without medicine- thrilled given we are in the middle of a very challenging year with covid 19.      #Restless leg syndrome S: Patient on Neupro 3 mg.  Ferritin levels have been low in the past- 42 in February with goal over 75- not taking iron.  A/P: still with mild poor control- encouraged to start iron over the counter at least twice a week. Can continue neupro patch     #Hyperlipidemia/peripheral vascular disease/renal artery stenosis S: Compliant with atorvastatin 40 mg (had fallen off med list)  and also on Plavix/asa per Dr. 76.  She has known atherosclerotic peripheral vascular disease with intermittent claudication-stable with claudication  In calves at approximately 200 feet.    Patient with right renal artery stenosis on angiogram June 2016.  She is on Plavix and statin. A/P: stable problems x 3  . Continue current medicine. Update lipid panel    #Insulin resistance/hyperglycemia S: Patient is compliant with metformin  500 milligrams daily.  Lab Results  Component Value Date   HGBA1C 6.1 01/08/2019  A/P: hopefully controlled- update a1c today.   Encouraged to cut out coke and rice krispy treats.   Recommended follow up: 6 months  Future Appointments  Date Time Provider Department Center  12/24/2019 12:30 PM 12/26/2019, MD [facility] None   Lab/Order associations:   ICD-10-CM   1. Gait abnormality  R26.9 Ambulatory referral to Home Health  2. At high risk for falls  Z91.81 Ambulatory referral to Home Health  3. Insulin resistance  E88.81 Hemoglobin A1c  4. Atherosclerotic PVD with intermittent claudication (HCC)  I70.219   5. Claudication (HCC)  I73.9   6. Renal artery stenosis (HCC)  I70.1   7. RESTLESS LEG SYNDROME  G25.81   8. Recurrent major depressive disorder, in full remission ([place])  F33.42   9. Mixed hyperlipidemia  E78.2 CBC with Differential/Platelet    Comprehensive metabolic panel    Lipid panel  10. Essential hypertension  I10 CBC with Differential/Platelet    Comprehensive metabolic panel    Lipid panel  11. Hyperglycemia  R73.9 Hemoglobin A1c    Meds ordered this encounter  Medications  . Olmesartan-amLODIPine-HCTZ (TRIBENZOR) 40-10-12.5 MG TABS    Sig: Take 1 tablet by mouth every morning.    Dispense:  90 tablet    Refill:  3  . metFORMIN (GLUCOPHAGE) 500 MG tablet    Sig: TAKE 1 (1) TABLET BY MOUTH DAILY    Dispense:  90 tablet    Refill:  3  . atorvastatin (LIPITOR) 40 MG tablet    Sig: Take 1 tablet (40 mg total) by mouth daily at 6 PM.    Dispense:  90 tablet    Refill:  3   Return precautions advised.  Garret Reddish, MD

## 2019-10-06 LAB — HEMOGLOBIN A1C: Hgb A1c MFr Bld: 6.1 % (ref 4.6–6.5)

## 2019-10-07 ENCOUNTER — Other Ambulatory Visit: Payer: Self-pay

## 2019-10-07 MED ORDER — ATORVASTATIN CALCIUM 40 MG PO TABS: 40.0000 mg | ORAL_TABLET | Freq: Every day | ORAL | 3 refills | Status: DC

## 2019-10-08 ENCOUNTER — Telehealth: Payer: Self-pay | Admitting: Family Medicine

## 2019-10-08 DIAGNOSIS — N390 Urinary tract infection, site not specified: Secondary | ICD-10-CM | POA: Diagnosis not present

## 2019-10-08 DIAGNOSIS — M810 Age-related osteoporosis without current pathological fracture: Secondary | ICD-10-CM | POA: Diagnosis not present

## 2019-10-08 DIAGNOSIS — Z9181 History of falling: Secondary | ICD-10-CM | POA: Diagnosis not present

## 2019-10-08 DIAGNOSIS — I70213 Atherosclerosis of native arteries of extremities with intermittent claudication, bilateral legs: Secondary | ICD-10-CM | POA: Diagnosis not present

## 2019-10-08 DIAGNOSIS — R269 Unspecified abnormalities of gait and mobility: Secondary | ICD-10-CM | POA: Diagnosis not present

## 2019-10-08 NOTE — Telephone Encounter (Signed)
[place]  Caller/Agency: [facility] Callback [phone] Requesting OT/PT/Skilled Nursing/Social They are wanting to resch appt until next due to unable to sch

## 2019-10-08 NOTE — Telephone Encounter (Signed)
See below

## 2019-10-11 NOTE — Telephone Encounter (Signed)
Returned Catherine call and provided VO for him.

## 2019-10-12 DIAGNOSIS — Z9181 History of falling: Secondary | ICD-10-CM | POA: Diagnosis not present

## 2019-10-12 DIAGNOSIS — M810 Age-related osteoporosis without current pathological fracture: Secondary | ICD-10-CM | POA: Diagnosis not present

## 2019-10-12 DIAGNOSIS — I70213 Atherosclerosis of native arteries of extremities with intermittent claudication, bilateral legs: Secondary | ICD-10-CM | POA: Diagnosis not present

## 2019-10-12 DIAGNOSIS — R269 Unspecified abnormalities of gait and mobility: Secondary | ICD-10-CM | POA: Diagnosis not present

## 2019-10-12 DIAGNOSIS — N390 Urinary tract infection, site not specified: Secondary | ICD-10-CM | POA: Diagnosis not present

## 2019-10-13 DIAGNOSIS — Z9181 History of falling: Secondary | ICD-10-CM

## 2019-10-13 DIAGNOSIS — M810 Age-related osteoporosis without current pathological fracture: Secondary | ICD-10-CM

## 2019-10-13 DIAGNOSIS — R269 Unspecified abnormalities of gait and mobility: Secondary | ICD-10-CM

## 2019-10-13 DIAGNOSIS — I70213 Atherosclerosis of native arteries of extremities with intermittent claudication, bilateral legs: Secondary | ICD-10-CM

## 2019-10-13 DIAGNOSIS — N390 Urinary tract infection, site not specified: Secondary | ICD-10-CM

## 2019-10-15 DIAGNOSIS — M810 Age-related osteoporosis without current pathological fracture: Secondary | ICD-10-CM | POA: Diagnosis not present

## 2019-10-15 DIAGNOSIS — Z9181 History of falling: Secondary | ICD-10-CM | POA: Diagnosis not present

## 2019-10-15 DIAGNOSIS — N390 Urinary tract infection, site not specified: Secondary | ICD-10-CM | POA: Diagnosis not present

## 2019-10-15 DIAGNOSIS — I70213 Atherosclerosis of native arteries of extremities with intermittent claudication, bilateral legs: Secondary | ICD-10-CM | POA: Diagnosis not present

## 2019-10-15 DIAGNOSIS — R269 Unspecified abnormalities of gait and mobility: Secondary | ICD-10-CM | POA: Diagnosis not present

## 2019-10-18 DIAGNOSIS — M810 Age-related osteoporosis without current pathological fracture: Secondary | ICD-10-CM | POA: Diagnosis not present

## 2019-10-18 DIAGNOSIS — I70213 Atherosclerosis of native arteries of extremities with intermittent claudication, bilateral legs: Secondary | ICD-10-CM | POA: Diagnosis not present

## 2019-10-18 DIAGNOSIS — R269 Unspecified abnormalities of gait and mobility: Secondary | ICD-10-CM | POA: Diagnosis not present

## 2019-10-18 DIAGNOSIS — N390 Urinary tract infection, site not specified: Secondary | ICD-10-CM | POA: Diagnosis not present

## 2019-10-18 DIAGNOSIS — Z9181 History of falling: Secondary | ICD-10-CM | POA: Diagnosis not present

## 2019-10-20 DIAGNOSIS — R269 Unspecified abnormalities of gait and mobility: Secondary | ICD-10-CM | POA: Diagnosis not present

## 2019-10-20 DIAGNOSIS — I70213 Atherosclerosis of native arteries of extremities with intermittent claudication, bilateral legs: Secondary | ICD-10-CM | POA: Diagnosis not present

## 2019-10-20 DIAGNOSIS — N390 Urinary tract infection, site not specified: Secondary | ICD-10-CM | POA: Diagnosis not present

## 2019-10-20 DIAGNOSIS — M810 Age-related osteoporosis without current pathological fracture: Secondary | ICD-10-CM | POA: Diagnosis not present

## 2019-10-20 DIAGNOSIS — Z9181 History of falling: Secondary | ICD-10-CM | POA: Diagnosis not present

## 2019-10-22 DIAGNOSIS — I70213 Atherosclerosis of native arteries of extremities with intermittent claudication, bilateral legs: Secondary | ICD-10-CM | POA: Diagnosis not present

## 2019-10-22 DIAGNOSIS — Z9181 History of falling: Secondary | ICD-10-CM | POA: Diagnosis not present

## 2019-10-22 DIAGNOSIS — R269 Unspecified abnormalities of gait and mobility: Secondary | ICD-10-CM | POA: Diagnosis not present

## 2019-10-22 DIAGNOSIS — N390 Urinary tract infection, site not specified: Secondary | ICD-10-CM | POA: Diagnosis not present

## 2019-10-22 DIAGNOSIS — M810 Age-related osteoporosis without current pathological fracture: Secondary | ICD-10-CM | POA: Diagnosis not present

## 2019-10-26 DIAGNOSIS — M810 Age-related osteoporosis without current pathological fracture: Secondary | ICD-10-CM | POA: Diagnosis not present

## 2019-10-26 DIAGNOSIS — I70213 Atherosclerosis of native arteries of extremities with intermittent claudication, bilateral legs: Secondary | ICD-10-CM | POA: Diagnosis not present

## 2019-10-26 DIAGNOSIS — Z9181 History of falling: Secondary | ICD-10-CM | POA: Diagnosis not present

## 2019-10-26 DIAGNOSIS — N390 Urinary tract infection, site not specified: Secondary | ICD-10-CM | POA: Diagnosis not present

## 2019-10-26 DIAGNOSIS — R269 Unspecified abnormalities of gait and mobility: Secondary | ICD-10-CM | POA: Diagnosis not present

## 2019-11-04 DIAGNOSIS — Z9181 History of falling: Secondary | ICD-10-CM | POA: Diagnosis not present

## 2019-11-04 DIAGNOSIS — N390 Urinary tract infection, site not specified: Secondary | ICD-10-CM | POA: Diagnosis not present

## 2019-11-04 DIAGNOSIS — R269 Unspecified abnormalities of gait and mobility: Secondary | ICD-10-CM | POA: Diagnosis not present

## 2019-11-04 DIAGNOSIS — M810 Age-related osteoporosis without current pathological fracture: Secondary | ICD-10-CM | POA: Diagnosis not present

## 2019-11-04 DIAGNOSIS — I70213 Atherosclerosis of native arteries of extremities with intermittent claudication, bilateral legs: Secondary | ICD-10-CM | POA: Diagnosis not present

## 2019-12-24 ENCOUNTER — Other Ambulatory Visit: Payer: Self-pay

## 2019-12-24 ENCOUNTER — Encounter (INDEPENDENT_AMBULATORY_CARE_PROVIDER_SITE_OTHER): Payer: Medicare Other | Admitting: Ophthalmology

## 2019-12-24 DIAGNOSIS — H43813 Vitreous degeneration, bilateral: Secondary | ICD-10-CM | POA: Diagnosis not present

## 2019-12-24 DIAGNOSIS — H353132 Nonexudative age-related macular degeneration, bilateral, intermediate dry stage: Secondary | ICD-10-CM

## 2019-12-24 DIAGNOSIS — H35033 Hypertensive retinopathy, bilateral: Secondary | ICD-10-CM | POA: Diagnosis not present

## 2019-12-24 DIAGNOSIS — I10 Essential (primary) hypertension: Secondary | ICD-10-CM

## 2019-12-28 ENCOUNTER — Inpatient Hospital Stay (HOSPITAL_COMMUNITY)
Admission: EM | Admit: 2019-12-28 | Discharge: 2020-01-03 | DRG: 444 | Disposition: A | Discharge status: Skilled Nursing Facility | Payer: Medicare Other | Attending: Internal Medicine | Admitting: Internal Medicine

## 2019-12-28 ENCOUNTER — Other Ambulatory Visit: Payer: Self-pay

## 2019-12-28 DIAGNOSIS — I10 Essential (primary) hypertension: Secondary | ICD-10-CM | POA: Diagnosis present

## 2019-12-28 DIAGNOSIS — N3090 Cystitis, unspecified without hematuria: Secondary | ICD-10-CM | POA: Diagnosis not present

## 2019-12-28 DIAGNOSIS — A419 Sepsis, unspecified organism: Secondary | ICD-10-CM | POA: Diagnosis not present

## 2019-12-28 DIAGNOSIS — K810 Acute cholecystitis: Secondary | ICD-10-CM | POA: Diagnosis present

## 2019-12-28 DIAGNOSIS — Z87891 Personal history of nicotine dependence: Secondary | ICD-10-CM

## 2019-12-28 DIAGNOSIS — R109 Unspecified abdominal pain: Secondary | ICD-10-CM | POA: Diagnosis not present

## 2019-12-28 DIAGNOSIS — Z823 Family history of stroke: Secondary | ICD-10-CM

## 2019-12-28 DIAGNOSIS — Z87440 Personal history of urinary (tract) infections: Secondary | ICD-10-CM

## 2019-12-28 DIAGNOSIS — K821 Hydrops of gallbladder: Secondary | ICD-10-CM | POA: Diagnosis not present

## 2019-12-28 DIAGNOSIS — J449 Chronic obstructive pulmonary disease, unspecified: Secondary | ICD-10-CM | POA: Diagnosis present

## 2019-12-28 DIAGNOSIS — E785 Hyperlipidemia, unspecified: Secondary | ICD-10-CM | POA: Diagnosis present

## 2019-12-28 DIAGNOSIS — Z9049 Acquired absence of other specified parts of digestive tract: Secondary | ICD-10-CM

## 2019-12-28 DIAGNOSIS — Z90710 Acquired absence of both cervix and uterus: Secondary | ICD-10-CM

## 2019-12-28 DIAGNOSIS — N390 Urinary tract infection, site not specified: Secondary | ICD-10-CM | POA: Diagnosis present

## 2019-12-28 DIAGNOSIS — M545 Low back pain: Secondary | ICD-10-CM | POA: Diagnosis present

## 2019-12-28 DIAGNOSIS — E279 Disorder of adrenal gland, unspecified: Secondary | ICD-10-CM | POA: Diagnosis present

## 2019-12-28 DIAGNOSIS — K5900 Constipation, unspecified: Secondary | ICD-10-CM | POA: Diagnosis present

## 2019-12-28 DIAGNOSIS — R1084 Generalized abdominal pain: Secondary | ICD-10-CM | POA: Diagnosis not present

## 2019-12-28 DIAGNOSIS — N3000 Acute cystitis without hematuria: Secondary | ICD-10-CM | POA: Diagnosis present

## 2019-12-28 DIAGNOSIS — Z8249 Family history of ischemic heart disease and other diseases of the circulatory system: Secondary | ICD-10-CM

## 2019-12-28 DIAGNOSIS — K8000 Calculus of gallbladder with acute cholecystitis without obstruction: Secondary | ICD-10-CM

## 2019-12-28 DIAGNOSIS — E278 Other specified disorders of adrenal gland: Secondary | ICD-10-CM | POA: Diagnosis present

## 2019-12-28 DIAGNOSIS — Z7984 Long term (current) use of oral hypoglycemic drugs: Secondary | ICD-10-CM

## 2019-12-28 DIAGNOSIS — N179 Acute kidney failure, unspecified: Secondary | ICD-10-CM | POA: Diagnosis not present

## 2019-12-28 DIAGNOSIS — E039 Hypothyroidism, unspecified: Secondary | ICD-10-CM | POA: Diagnosis present

## 2019-12-28 DIAGNOSIS — E876 Hypokalemia: Secondary | ICD-10-CM | POA: Diagnosis present

## 2019-12-28 DIAGNOSIS — D638 Anemia in other chronic diseases classified elsewhere: Secondary | ICD-10-CM | POA: Diagnosis present

## 2019-12-28 DIAGNOSIS — G8929 Other chronic pain: Secondary | ICD-10-CM | POA: Diagnosis present

## 2019-12-28 DIAGNOSIS — Z1611 Resistance to penicillins: Secondary | ICD-10-CM | POA: Diagnosis not present

## 2019-12-28 DIAGNOSIS — Z66 Do not resuscitate: Secondary | ICD-10-CM | POA: Diagnosis present

## 2019-12-28 DIAGNOSIS — G2581 Restless legs syndrome: Secondary | ICD-10-CM | POA: Diagnosis present

## 2019-12-28 DIAGNOSIS — B961 Klebsiella pneumoniae [K. pneumoniae] as the cause of diseases classified elsewhere: Secondary | ICD-10-CM | POA: Diagnosis present

## 2019-12-28 DIAGNOSIS — K659 Peritonitis, unspecified: Secondary | ICD-10-CM | POA: Diagnosis not present

## 2019-12-28 DIAGNOSIS — K219 Gastro-esophageal reflux disease without esophagitis: Secondary | ICD-10-CM | POA: Diagnosis present

## 2019-12-28 DIAGNOSIS — Z20822 Contact with and (suspected) exposure to covid-19: Secondary | ICD-10-CM | POA: Diagnosis present

## 2019-12-28 DIAGNOSIS — I70219 Atherosclerosis of native arteries of extremities with intermittent claudication, unspecified extremity: Secondary | ICD-10-CM | POA: Diagnosis present

## 2019-12-28 DIAGNOSIS — R52 Pain, unspecified: Secondary | ICD-10-CM | POA: Diagnosis not present

## 2019-12-28 DIAGNOSIS — I480 Paroxysmal atrial fibrillation: Secondary | ICD-10-CM | POA: Diagnosis present

## 2019-12-28 DIAGNOSIS — E8881 Metabolic syndrome: Secondary | ICD-10-CM

## 2019-12-28 DIAGNOSIS — R7303 Prediabetes: Secondary | ICD-10-CM | POA: Diagnosis present

## 2019-12-28 DIAGNOSIS — L0291 Cutaneous abscess, unspecified: Secondary | ICD-10-CM

## 2019-12-28 DIAGNOSIS — R1031 Right lower quadrant pain: Secondary | ICD-10-CM

## 2019-12-28 DIAGNOSIS — I77811 Abdominal aortic ectasia: Secondary | ICD-10-CM | POA: Diagnosis present

## 2019-12-28 LAB — CBC
HCT: 37.3 % (ref 36.0–46.0)
Hemoglobin: 12 g/dL (ref 12.0–15.0)
MCH: 29.6 pg (ref 26.0–34.0)
MCHC: 32.2 g/dL (ref 30.0–36.0)
MCV: 91.9 fL (ref 80.0–100.0)
Platelets: 273 10*3/uL (ref 150–400)
RBC: 4.06 MIL/uL (ref 3.87–5.11)
RDW: 13.1 % (ref 11.5–15.5)
WBC: 14.2 10*3/uL — ABNORMAL HIGH (ref 4.0–10.5)
nRBC: 0 % (ref 0.0–0.2)

## 2019-12-28 MED ORDER — SODIUM CHLORIDE 0.9% FLUSH
3.0000 mL | Freq: Once | INTRAVENOUS | Status: AC
  Administered 2019-12-29: 3 mL via INTRAVENOUS

## 2019-12-28 NOTE — ED Triage Notes (Signed)
Pt presents to ED from home BIB [facility]. Pt c/o RLQ abd pain pt states it began this morning. Pain is sharp and constant. Pt c/o nausea, no bowel movement in 3d. EMS VSS.

## 2019-12-29 ENCOUNTER — Emergency Department (HOSPITAL_COMMUNITY): Payer: Medicare Other

## 2019-12-29 DIAGNOSIS — G8929 Other chronic pain: Secondary | ICD-10-CM | POA: Diagnosis present

## 2019-12-29 DIAGNOSIS — E785 Hyperlipidemia, unspecified: Secondary | ICD-10-CM | POA: Diagnosis present

## 2019-12-29 DIAGNOSIS — A419 Sepsis, unspecified organism: Secondary | ICD-10-CM | POA: Diagnosis present

## 2019-12-29 DIAGNOSIS — I739 Peripheral vascular disease, unspecified: Secondary | ICD-10-CM | POA: Diagnosis not present

## 2019-12-29 DIAGNOSIS — N390 Urinary tract infection, site not specified: Secondary | ICD-10-CM | POA: Diagnosis present

## 2019-12-29 DIAGNOSIS — E278 Other specified disorders of adrenal gland: Secondary | ICD-10-CM | POA: Diagnosis present

## 2019-12-29 DIAGNOSIS — R7303 Prediabetes: Secondary | ICD-10-CM | POA: Diagnosis present

## 2019-12-29 DIAGNOSIS — M2550 Pain in unspecified joint: Secondary | ICD-10-CM | POA: Diagnosis not present

## 2019-12-29 DIAGNOSIS — E876 Hypokalemia: Secondary | ICD-10-CM | POA: Diagnosis present

## 2019-12-29 DIAGNOSIS — R935 Abnormal findings on diagnostic imaging of other abdominal regions, including retroperitoneum: Secondary | ICD-10-CM | POA: Diagnosis not present

## 2019-12-29 DIAGNOSIS — K810 Acute cholecystitis: Secondary | ICD-10-CM | POA: Diagnosis not present

## 2019-12-29 DIAGNOSIS — Z66 Do not resuscitate: Secondary | ICD-10-CM | POA: Diagnosis present

## 2019-12-29 DIAGNOSIS — K5900 Constipation, unspecified: Secondary | ICD-10-CM | POA: Diagnosis present

## 2019-12-29 DIAGNOSIS — R278 Other lack of coordination: Secondary | ICD-10-CM | POA: Diagnosis not present

## 2019-12-29 DIAGNOSIS — Z20822 Contact with and (suspected) exposure to covid-19: Secondary | ICD-10-CM | POA: Diagnosis present

## 2019-12-29 DIAGNOSIS — K219 Gastro-esophageal reflux disease without esophagitis: Secondary | ICD-10-CM | POA: Diagnosis present

## 2019-12-29 DIAGNOSIS — R41841 Cognitive communication deficit: Secondary | ICD-10-CM | POA: Diagnosis not present

## 2019-12-29 DIAGNOSIS — N3000 Acute cystitis without hematuria: Secondary | ICD-10-CM | POA: Diagnosis present

## 2019-12-29 DIAGNOSIS — E46 Unspecified protein-calorie malnutrition: Secondary | ICD-10-CM | POA: Diagnosis not present

## 2019-12-29 DIAGNOSIS — I77811 Abdominal aortic ectasia: Secondary | ICD-10-CM | POA: Diagnosis present

## 2019-12-29 DIAGNOSIS — K821 Hydrops of gallbladder: Secondary | ICD-10-CM | POA: Diagnosis present

## 2019-12-29 DIAGNOSIS — I70219 Atherosclerosis of native arteries of extremities with intermittent claudication, unspecified extremity: Secondary | ICD-10-CM | POA: Diagnosis present

## 2019-12-29 DIAGNOSIS — M6259 Muscle wasting and atrophy, not elsewhere classified, multiple sites: Secondary | ICD-10-CM | POA: Diagnosis not present

## 2019-12-29 DIAGNOSIS — R1084 Generalized abdominal pain: Secondary | ICD-10-CM | POA: Diagnosis not present

## 2019-12-29 DIAGNOSIS — B961 Klebsiella pneumoniae [K. pneumoniae] as the cause of diseases classified elsewhere: Secondary | ICD-10-CM | POA: Diagnosis present

## 2019-12-29 DIAGNOSIS — I4891 Unspecified atrial fibrillation: Secondary | ICD-10-CM | POA: Diagnosis not present

## 2019-12-29 DIAGNOSIS — R5381 Other malaise: Secondary | ICD-10-CM | POA: Diagnosis not present

## 2019-12-29 DIAGNOSIS — K8000 Calculus of gallbladder with acute cholecystitis without obstruction: Secondary | ICD-10-CM | POA: Diagnosis present

## 2019-12-29 DIAGNOSIS — D649 Anemia, unspecified: Secondary | ICD-10-CM | POA: Diagnosis not present

## 2019-12-29 DIAGNOSIS — K659 Peritonitis, unspecified: Secondary | ICD-10-CM | POA: Diagnosis present

## 2019-12-29 DIAGNOSIS — R52 Pain, unspecified: Secondary | ICD-10-CM | POA: Diagnosis not present

## 2019-12-29 DIAGNOSIS — R1311 Dysphagia, oral phase: Secondary | ICD-10-CM | POA: Diagnosis not present

## 2019-12-29 DIAGNOSIS — G2581 Restless legs syndrome: Secondary | ICD-10-CM | POA: Diagnosis present

## 2019-12-29 DIAGNOSIS — M6281 Muscle weakness (generalized): Secondary | ICD-10-CM | POA: Diagnosis not present

## 2019-12-29 DIAGNOSIS — J449 Chronic obstructive pulmonary disease, unspecified: Secondary | ICD-10-CM | POA: Diagnosis present

## 2019-12-29 DIAGNOSIS — I10 Essential (primary) hypertension: Secondary | ICD-10-CM | POA: Diagnosis present

## 2019-12-29 DIAGNOSIS — M1990 Unspecified osteoarthritis, unspecified site: Secondary | ICD-10-CM | POA: Diagnosis not present

## 2019-12-29 DIAGNOSIS — M545 Low back pain: Secondary | ICD-10-CM | POA: Diagnosis present

## 2019-12-29 DIAGNOSIS — D638 Anemia in other chronic diseases classified elsewhere: Secondary | ICD-10-CM | POA: Diagnosis present

## 2019-12-29 DIAGNOSIS — K819 Cholecystitis, unspecified: Secondary | ICD-10-CM | POA: Diagnosis not present

## 2019-12-29 DIAGNOSIS — N3090 Cystitis, unspecified without hematuria: Secondary | ICD-10-CM | POA: Diagnosis not present

## 2019-12-29 DIAGNOSIS — E8809 Other disorders of plasma-protein metabolism, not elsewhere classified: Secondary | ICD-10-CM | POA: Diagnosis not present

## 2019-12-29 DIAGNOSIS — Z1611 Resistance to penicillins: Secondary | ICD-10-CM | POA: Diagnosis present

## 2019-12-29 DIAGNOSIS — Z7401 Bed confinement status: Secondary | ICD-10-CM | POA: Diagnosis not present

## 2019-12-29 DIAGNOSIS — R109 Unspecified abdominal pain: Secondary | ICD-10-CM | POA: Diagnosis not present

## 2019-12-29 DIAGNOSIS — N179 Acute kidney failure, unspecified: Secondary | ICD-10-CM | POA: Diagnosis not present

## 2019-12-29 DIAGNOSIS — E039 Hypothyroidism, unspecified: Secondary | ICD-10-CM | POA: Diagnosis present

## 2019-12-29 DIAGNOSIS — F330 Major depressive disorder, recurrent, mild: Secondary | ICD-10-CM | POA: Diagnosis not present

## 2019-12-29 DIAGNOSIS — I480 Paroxysmal atrial fibrillation: Secondary | ICD-10-CM | POA: Diagnosis present

## 2019-12-29 DIAGNOSIS — R531 Weakness: Secondary | ICD-10-CM | POA: Diagnosis not present

## 2019-12-29 HISTORY — DX: Acute cholecystitis: K81.0

## 2019-12-29 LAB — COMPREHENSIVE METABOLIC PANEL
ALT: 16 U/L (ref 0–44)
AST: 22 U/L (ref 15–41)
Albumin: 3.7 g/dL (ref 3.5–5.0)
Alkaline Phosphatase: 97 U/L (ref 38–126)
Anion gap: 13 (ref 5–15)
BUN: 22 mg/dL (ref 8–23)
CO2: 23 mmol/L (ref 22–32)
Calcium: 8.9 mg/dL (ref 8.9–10.3)
Chloride: 99 mmol/L (ref 98–111)
Creatinine, Ser: 0.86 mg/dL (ref 0.44–1.00)
GFR calc Af Amer: >60 mL/min (ref >60)
GFR calc non Af Amer: >60 mL/min (ref >60)
Glucose, Bld: 162 mg/dL — ABNORMAL HIGH (ref 70–99)
Potassium: 3.9 mmol/L (ref 3.5–5.1)
Sodium: 135 mmol/L (ref 135–145)
Total Bilirubin: 0.9 mg/dL (ref 0.3–1.2)
Total Protein: 7.2 g/dL (ref 6.5–8.1)

## 2019-12-29 LAB — URINALYSIS, ROUTINE W REFLEX MICROSCOPIC
Bilirubin Urine: NEGATIVE
Glucose, UA: NEGATIVE mg/dL
Hgb urine dipstick: NEGATIVE
Ketones, ur: NEGATIVE mg/dL
Nitrite: NEGATIVE
Protein, ur: 30 mg/dL — AB
Specific Gravity, Urine: 1.014 (ref 1.005–1.030)
WBC, UA: >50 WBC/hpf — ABNORMAL HIGH (ref 0–5)
pH: 6 (ref 5.0–8.0)

## 2019-12-29 LAB — GLUCOSE, CAPILLARY
Glucose-Capillary: 118 mg/dL — ABNORMAL HIGH (ref 70–99)
Glucose-Capillary: 105 mg/dL — ABNORMAL HIGH (ref 70–99)
Glucose-Capillary: 87 mg/dL (ref 70–99)
Glucose-Capillary: 102 mg/dL — ABNORMAL HIGH (ref 70–99)
Glucose-Capillary: 104 mg/dL — ABNORMAL HIGH (ref 70–99)
Glucose-Capillary: 92 mg/dL (ref 70–99)

## 2019-12-29 LAB — RESPIRATORY PANEL BY RT PCR (FLU A&B, COVID)
Influenza A by PCR: NEGATIVE
Influenza B by PCR: NEGATIVE
SARS Coronavirus 2 by RT PCR: NEGATIVE

## 2019-12-29 LAB — LACTIC ACID, PLASMA
Lactic Acid, Venous: 2.1 mmol/L (ref 0.5–1.9)
Lactic Acid, Venous: 1.6 mmol/L (ref 0.5–1.9)

## 2019-12-29 LAB — LIPASE, BLOOD: Lipase: 16 U/L (ref 11–51)

## 2019-12-29 MED ORDER — SODIUM CHLORIDE 0.9 % IV SOLN
1.0000 g | Freq: Once | INTRAVENOUS | Status: AC
  Administered 2019-12-29: 1 g via INTRAVENOUS
  Filled 2019-12-29: qty 10

## 2019-12-29 MED ORDER — SODIUM CHLORIDE 0.9 % IV SOLN
2.0000 g | INTRAVENOUS | Status: DC
  Filled 2019-12-29: qty 20

## 2019-12-29 MED ORDER — FENTANYL CITRATE (PF) 100 MCG/2ML IJ SOLN
50.0000 ug | Freq: Once | INTRAMUSCULAR | Status: AC
  Administered 2019-12-29: 50 ug via INTRAVENOUS
  Filled 2019-12-29: qty 2

## 2019-12-29 MED ORDER — IOHEXOL 300 MG/ML  SOLN
100.0000 mL | Freq: Once | INTRAMUSCULAR | Status: AC | PRN
  Administered 2019-12-29: 100 mL via INTRAVENOUS

## 2019-12-29 MED ORDER — ACETAMINOPHEN 650 MG RE SUPP: 650.0000 mg | Freq: Four times a day (QID) | RECTAL | Status: DC | PRN

## 2019-12-29 MED ORDER — PIPERACILLIN-TAZOBACTAM 3.375 G IVPB
3.3750 g | Freq: Three times a day (TID) | INTRAVENOUS | Status: DC
  Administered 2019-12-29 – 2020-01-02 (×12): 3.375 g via INTRAVENOUS
  Filled 2019-12-29 (×12): qty 50

## 2019-12-29 MED ORDER — METRONIDAZOLE IN NACL 5-0.79 MG/ML-% IV SOLN
500.0000 mg | Freq: Three times a day (TID) | INTRAVENOUS | Status: DC
  Administered 2019-12-29: 500 mg via INTRAVENOUS
  Filled 2019-12-29: qty 100

## 2019-12-29 MED ORDER — INSULIN ASPART 100 UNIT/ML ~~LOC~~ SOLN: 0.0000 [IU] | SUBCUTANEOUS | Status: DC

## 2019-12-29 MED ORDER — MORPHINE SULFATE (PF) 2 MG/ML IV SOLN
1.0000 mg | INTRAVENOUS | Status: DC | PRN
  Administered 2019-12-29 – 2019-12-31 (×6): 1 mg via INTRAVENOUS
  Filled 2019-12-29 (×6): qty 1

## 2019-12-29 MED ORDER — SODIUM CHLORIDE 0.9 % IV BOLUS
500.0000 mL | Freq: Once | INTRAVENOUS | Status: AC
  Administered 2019-12-29: 500 mL via INTRAVENOUS

## 2019-12-29 MED ORDER — METRONIDAZOLE IN NACL 5-0.79 MG/ML-% IV SOLN
500.0000 mg | Freq: Once | INTRAVENOUS | Status: AC
  Administered 2019-12-29: 500 mg via INTRAVENOUS
  Filled 2019-12-29: qty 100

## 2019-12-29 MED ORDER — ROTIGOTINE 3 MG/24HR TD PT24
3.0000 mg | MEDICATED_PATCH | Freq: Every day | TRANSDERMAL | Status: DC
  Administered 2019-12-29 – 2020-01-02 (×5): 3 mg via TRANSDERMAL
  Filled 2019-12-29 (×5): qty 1

## 2019-12-29 MED ORDER — SODIUM CHLORIDE 0.9 % IV SOLN: INTRAVENOUS | Status: AC

## 2019-12-29 MED ORDER — ONDANSETRON HCL 4 MG/2ML IJ SOLN: 4.0000 mg | Freq: Four times a day (QID) | INTRAMUSCULAR | Status: DC | PRN

## 2019-12-29 MED ORDER — SODIUM CHLORIDE 0.9 % IV SOLN: Freq: Once | INTRAVENOUS | Status: AC

## 2019-12-29 MED ORDER — ACETAMINOPHEN 325 MG PO TABS: 650.0000 mg | ORAL_TABLET | Freq: Four times a day (QID) | ORAL | Status: DC | PRN

## 2019-12-29 NOTE — Plan of Care (Signed)
  Problem: Education: Goal: Knowledge of General Education information will improve Description: Including pain rating scale, medication(s)/side effects and non-pharmacologic comfort measures Outcome: Progressing   Problem: Health Behavior/Discharge Planning: Goal: Ability to manage health-related needs will improve Outcome: Progressing   Problem: Clinical Measurements: Goal: Ability to maintain clinical measurements within normal limits will improve Outcome: Progressing Goal: Will remain free from infection Outcome: Progressing   Problem: Elimination: Goal: Will not experience complications related to urinary retention Outcome: Progressing   Problem: Pain Managment: Goal: General experience of comfort will improve Outcome: Progressing   Problem: Safety: Goal: Ability to remain free from injury will improve Outcome: Progressing   Problem: Skin Integrity: Goal: Risk for impaired skin integrity will decrease Outcome: Progressing

## 2019-12-29 NOTE — Progress Notes (Signed)
PROGRESS NOTE    Cheryl Atkinson  BOF:[id number] DOB: 1931-11-03 DOA: 12/28/2019 PCP: Shelva Majestic, MD     Brief Narrative:  From Dr. Verner Mould H&P: "Cheryl Atkinson is a 86 y.o. female with medical history significant of GERD, hiatal hernia, history of appendectomy, hypertension, hyperlipidemia, PAD, prediabetes presenting to the ED for evaluation of abdominal pain.  Patient reports 2-day history of right lower quadrant abdominal pain and nausea."  CT scan showed an a calculus hydropic gallbladder with inflammation and biliary duct dilatation.  Elevated white count and lactic acid.  LFTs unremarkable.  She was started on Rocephin and Flagyl.  General surgery was consulted.   New events last 24 hours / Subjective: Patient reports having right-sided abdominal pain.  No nausea or vomiting.  General surgery saw the patient and recommended MRCP which was ordered.  Assessment & Plan:   Principal Problem:   Acute cholecystitis  Appreciate gen surg, await results of MRCP  Should be able to eat after imaging  Abx changed to Zosyn 3.375 mg TID  Zofran prn  Morphine 1 mg q 3 hrs prn   Active Problems:   Sepsis (WBC, RR and T)  Monitor CBC   Lactate now WNL  Blood cx NGTD     Atherosclerotic PVD with intermittent claudication (HCC)  Hold Plavix for now pending possible procedure    UTI (urinary tract infection)  Abx changed to Zosyn  Cx pending    Adrenal nodule (HCC)  DVT prophylaxis: SCDs Code Status: DNR Family Communication: Self Disposition Plan: Home   Consultants:   Gen Surg  Procedures:   MRCP   Antimicrobials:  Anti-infectives (From admission, onward)   Start     Dose/Rate Route Frequency Ordered Stop   12/29/19 1400  piperacillin-tazobactam (ZOSYN) IVPB 3.375 g     3.375 g 12.5 mL/hr over 240 Minutes Intravenous Every 8 hours 12/29/19 1136     12/29/19 1200  cefTRIAXone (ROCEPHIN) 2 g in sodium chloride 0.9 % 100 mL IVPB  Status:  Discontinued     2  g 200 mL/hr over 30 Minutes Intravenous Every 24 hours 12/29/19 0623 12/29/19 1136   12/29/19 1000  metroNIDAZOLE (FLAGYL) IVPB 500 mg  Status:  Discontinued     500 mg 100 mL/hr over 60 Minutes Intravenous Every 8 hours 12/29/19 0623 12/29/19 1136   12/29/19 0415  metroNIDAZOLE (FLAGYL) IVPB 500 mg     500 mg 100 mL/hr over 60 Minutes Intravenous  Once 12/29/19 0401 12/29/19 0510   12/29/19 0245  cefTRIAXone (ROCEPHIN) 1 g in sodium chloride 0.9 % 100 mL IVPB     1 g 200 mL/hr over 30 Minutes Intravenous  Once 12/29/19 0231 12/29/19 0346        Objective: Vitals:   12/29/19 0445 12/29/19 0603 12/29/19 0604 12/29/19 0656  BP:   (!) 120/96 126/65  Pulse: 77   74  Resp: 19 19 15 20   Temp:    (!) 97.5 F (36.4 C)  TempSrc:    Oral  SpO2: 96%  95% 100%  Weight:    65.3 kg  Height:    5\' 6"  (1.676 m)    Intake/Output Summary (Last 24 hours) at 12/29/2019 1322 Last data filed at 12/29/2019 1035 Gross per 24 hour  Intake --  Output 150 ml  Net -150 ml   Filed Weights   12/29/19 0656  Weight: 65.3 kg    Examination:  General exam: Appears calm and comfortable  Respiratory system:  Clear to auscultation. Respiratory effort normal. No respiratory distress. No conversational dyspnea.  Cardiovascular system: S1 & S2 heard, RRR. No murmurs. No pedal edema. Gastrointestinal system: Abdomen is nondistended, soft and ttp over R side. Normal bowel sounds heard. Central nervous system: Alert and oriented. No focal neurological deficits. Speech clear.  Extremities: Symmetric in appearance  Skin: No rashes, lesions or ulcers on exposed skin  Psychiatry: Judgement and insight appear normal. Mood & affect appropriate.   Data Reviewed: I have personally reviewed following labs and imaging studies  CBC: Recent Labs  Lab 12/28/19 2330  WBC 14.2*  HGB 12.0  HCT 37.3  MCV 91.9  PLT 273   Basic Metabolic Panel: Recent Labs  Lab 12/28/19 2330  NA 135  K 3.9  CL 99  CO2 23   GLUCOSE 162*  BUN 22  CREATININE 0.86  CALCIUM 8.9   GFR: Estimated Creatinine Clearance: 44 mL/min (by C-G formula based on SCr of 0.86 mg/dL). Liver Function Tests: Recent Labs  Lab 12/28/19 2330  AST 22  ALT 16  ALKPHOS 97  BILITOT 0.9  PROT 7.2  ALBUMIN 3.7   Recent Labs  Lab 12/28/19 2330  LIPASE 16   CBG: Recent Labs  Lab 12/29/19 0739 12/29/19 1240  GLUCAP 118* 105*   Sepsis Labs: Recent Labs  Lab 12/29/19 0243 12/29/19 0715  LATICACIDVEN 2.1* 1.6    Recent Results (from the past 240 hour(s))  Culture, blood (Routine X 2) w Reflex to ID Panel     Status: None (Preliminary result)   Collection Time: 12/29/19  4:27 AM   Specimen: BLOOD LEFT FOREARM  Result Value Ref Range Status   Specimen Description BLOOD LEFT FOREARM  Final   Special Requests   Final    BOTTLES DRAWN AEROBIC AND ANAEROBIC Blood Culture results may not be optimal due to an inadequate volume of blood received in culture bottles   Culture   Final    NO GROWTH < 12 HOURS Performed at [facility], [address], [place], [place] [address]    Report Status PENDING  Incomplete  Respiratory Panel by RT PCR (Flu A&B, Covid) - Nasopharyngeal Swab     Status: None   Collection Time: 12/29/19  5:04 AM   Specimen: Nasopharyngeal Swab  Result Value Ref Range Status   SARS Coronavirus 2 by RT PCR NEGATIVE NEGATIVE Final    Comment: (NOTE) SARS-CoV-2 target nucleic acids are NOT DETECTED. The SARS-CoV-2 RNA is generally detectable in upper respiratoy specimens during the acute phase of infection. The lowest concentration of SARS-CoV-2 viral copies this assay can detect is 131 copies/mL. A negative result does not preclude SARS-Cov-2 infection and should not be used as the sole basis for treatment or other patient management decisions. A negative result may occur with  improper specimen collection/handling, submission of specimen other than nasopharyngeal swab, presence of viral  mutation(s) within the areas targeted by this assay, and inadequate number of viral copies (<131 copies/mL). A negative result must be combined with clinical observations, patient history, and epidemiological information. The expected result is Negative. Fact Sheet for Patients:  https://www.moore.com/ Fact Sheet for Healthcare Providers:  https://www.young.biz/ This test is not yet ap proved or cleared by the [place] FDA and  has been authorized for detection and/or diagnosis of SARS-CoV-2 by FDA under an Emergency Use Authorization (EUA). This EUA will remain  in effect (meaning this test can be used) for the duration of the COVID-19 declaration under Section  564(b)(1) of the Act, 21 U.S.C. section 360bbb-3(b)(1), unless the authorization is terminated or revoked sooner.    Influenza A by PCR NEGATIVE NEGATIVE Final   Influenza B by PCR NEGATIVE NEGATIVE Final    Comment: (NOTE) The Xpert Xpress SARS-CoV-2/FLU/RSV assay is intended as an aid in  the diagnosis of influenza from Nasopharyngeal swab specimens and  should not be used as a sole basis for treatment. Nasal washings and  aspirates are unacceptable for Xpert Xpress SARS-CoV-2/FLU/RSV  testing. Fact Sheet for Patients: https://www.moore.com/ Fact Sheet for Healthcare Providers: https://www.young.biz/ This test is not yet approved or cleared by the [place] FDA and  has been authorized for detection and/or diagnosis of SARS-CoV-2 by  FDA under an Emergency Use Authorization (EUA). This EUA will remain  in effect (meaning this test can be used) for the duration of the  Covid-19 declaration under Section 564(b)(1) of the Act, 21  U.S.C. section 360bbb-3(b)(1), unless the authorization is  terminated or revoked. Performed at [facility], [address], [place], [place] [address]   Culture, blood (Routine X 2) w Reflex to ID Panel      Status: None (Preliminary result)   Collection Time: 12/29/19  7:15 AM   Specimen: BLOOD  Result Value Ref Range Status   Specimen Description BLOOD LEFT ANTECUBITAL  Final   Special Requests   Final    BOTTLES DRAWN AEROBIC AND ANAEROBIC Blood Culture results may not be optimal due to an excessive volume of blood received in culture bottles   Culture   Final    NO GROWTH < 12 HOURS Performed at [facility], [address] [place], [place] [address]    Report Status PENDING  Incomplete      Radiology Studies: CT ABDOMEN PELVIS W CONTRAST  Result Date: 12/29/2019 CLINICAL DATA:  Sharp constant right lower quadrant abdominal pain with nausea and constipation EXAM: CT ABDOMEN AND PELVIS WITH CONTRAST TECHNIQUE: Multidetector CT imaging of the abdomen and pelvis was performed using the standard protocol following bolus administration of intravenous contrast. CONTRAST:  OMNIPAQUE IOHEXOL 300 MG/ML  SOLN COMPARISON:  Radiograph Apr 15, 2011, CT lumbar spine January 04, 2011 FINDINGS: Lower chest: Bandlike areas of atelectasis in the lung base with more dependent atelectasis in the posterior right lower lobe. Mild cardiomegaly with coronary artery atherosclerosis. No pericardial effusion. Hepatobiliary: No focal liver lesions are identified. Gallbladder is markedly hydropic mural thickening. There is extensive pericholecystic inflammation with mucosal hyperemia and marked intra and extrahepatic biliary ductal dilatation to the level of the pancreatic head. Inflammation extends inferolaterally to the gallbladder fossa into the right pericolic gutter as well as with stranding and phlegmon about the proximal duodenal sweep likely secondary to the gallbladder process. Pancreas: Pancreas is largely atrophic. No pancreatic ductal dilatation. Spleen: Normal in size without focal abnormality. Adrenals/Urinary Tract: 1.4 cm nodule in the body of the left adrenal gland is nonspecific. None no  right adrenal nodules. 1 cm fluid attenuation cyst seen in the interpolar left kidney. No worrisome renal lesions. No hydronephrosis or urolithiasis. Urinary bladder appears circumferentially thickened with multiple bladder diverticula seen along the right lateral aspect of the bladder. Some hazy stranding is notable about these multiple bladder diverticula. Stomach/Bowel: Distal esophagus and stomach are unremarkable. Stranding adjacent the proximal duodenum is likely secondary to the gallbladder. No small bowel dilatation or wall thickening. Cecum is slightly displaced into the midline abdomen. The appendix is surgically absent. No colonic dilatation or wall  thickening. Scattered colonic diverticula without focal pericolonic inflammation to suggest diverticulitis. Vascular/Lymphatic: Atherosclerotic plaque within the normal caliber aorta. Focal fusiform ectasia of the infrarenal abdominal aorta measuring up to 2.5 cm in diameter. Reactive adenopathy in the upper abdomen. No pathologically enlarged lymph nodes in the abdomen or pelvis. Reproductive: Uterus is surgically absent. No concerning adnexal lesions. Other: No abdominopelvic free fluid or free gas. Stranding and inflammation extends from the gallbladder to the right paracolic gutter. Additional hazy stranding is noted upon the multiple bladder diverticula in the pelvis. No bowel containing hernias. Musculoskeletal: The osseous structures appear diffusely demineralized which may limit detection of small or nondisplaced fractures. Multilevel degenerative changes are present in the imaged portions of the spine. 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 No acute osseous abnormality or suspicious osseous lesion. IMPRESSION: 1. Markedly hydropic gallbladder with extensive pericholecystic inflammation and marked intra and extrahepatic biliary ductal dilatation to the level of the pancreatic head. No aggressive  or invasive features are seen. Findings are compatible with acute cholecystitis. 2. Extensive intra and extrahepatic biliary ductal dilatation with acute tapering at the level of the pancreatic head. No visible gallstones. Could consider right upper quadrant ultrasound or MRCP for further evaluation. 3. Urinary bladder appears circumferentially thickened with multiple bladder diverticula seen along the right lateral aspect of the bladder. Some hazy stranding is noted about these bladder diverticula. Findings are suggestive of cystitis. Correlate with urinalysis. 4. Indeterminate 1.4 cm nodule in the body of the left adrenal gland. If there is no history of malignancy, further characterization could be performed with adrenal mass protocol CT or MRI. This recommendation follows ACR consensus guidelines: Management of Incidental Adrenal Masses: A White Paper of the ACR Incidental Findings Committee. J Am Coll Radiol 2017;14:1038-1044. 5. Focal fusiform ectasia of the infrarenal abdominal aorta measuring up to 2.5 cm in diameter. Ectatic abdominal aorta at risk for aneurysm development. Recommend followup by ultrasound in 5 years. This recommendation follows ACR consensus guidelines: White Paper of the ACR Incidental Findings Committee II on Vascular Findings. J Am Coll Radiol 2013; 10:789-794. Aortic aneurysm NOS (ICD10-I71.9) 6.  Aortic Atherosclerosis (ICD10-I70.0). These results were called by telephone at the time of interpretation on 12/29/2019 at 4:03 am to provider Dr Dayna Barker, who verbally acknowledged these results. Electronically Signed   By: Lovena Le M.D.   On: 12/29/2019 04:03      Scheduled Meds: . insulin aspart  0-6 Units Subcutaneous Q4H  . Rotigotine  3 mg Transdermal QHS   Continuous Infusions: . sodium chloride 100 mL/hr at 12/29/19 0738  . piperacillin-tazobactam (ZOSYN)  IV       LOS: 0 days      Time spent: 30 minutes   Shelda Pal, DO [facility] 12/29/2019, 1:22 PM   Available via Epic secure chat 7am-7pm After these hours, please refer to coverage provider listed on [email or web address]

## 2019-12-29 NOTE — ED Notes (Signed)
First set of cultures obtained. Pt had already received one dose of antimitotics and started the second

## 2019-12-29 NOTE — H&P (Addendum)
History and Physical    Cheryl Atkinson:[id number] DOB: [birth year] DOA: 12/28/2019  PCP: Shelva Majestic, MD Patient coming from: Home  Chief Complaint: Abdominal pain  HPI: Cheryl Atkinson is a 86 y.o. female with medical history significant of GERD, hiatal hernia, history of appendectomy, hypertension, hyperlipidemia, PAD, prediabetes presenting to the ED for evaluation of abdominal pain.  Patient reports 2-day history of right lower quadrant abdominal pain and nausea.  No vomiting or diarrhea.  No fevers or chills.  No cough, shortness of breath, chest pain.  Does report dysuria and urinary frequency/urgency.  ED Course: Febrile with temperature 100.6 F.  Not tachycardic or hypotensive.  Labs showing WBC count 14.2.  Lactic acid level 2.1.  Lipase and LFTs normal.  UA with large amount of leukocytes, greater than 50 WBCs, and many bacteria.  Urine culture pending. CT abdomen pelvis showing markedly hydropic gallbladder with extensive pericholecystic inflammation and marked intra and extrahepatic biliary ductal dilatation to the level of the pancreatic head.  No visible gallstones.  Findings concerning for acute cholecystitis.   ED provider discussed the case with Dr. Myrtie Neither from GI who did not feel the need for MRCP or ERCP at this time as it would delay appropriate care.  He recommended urgent surgical consultation.  Case was also discussed with Dr. Corliss Skains from general surgery, will consult in a.m.  Recommended holding Plavix. SARS-CoV-2 PCR test pending. Patient received ceftriaxone and Flagyl.  Review of Systems:  All systems reviewed and apart from history of presenting illness, are negative.  Past Medical History:  Diagnosis Date  . Anemia   . Arthritis    "shoulders" (05/04/2015)  . Cellulitis of right lower extremity 11/27/2013  . Chronic lower back pain   . Constipation   . GERD (gastroesophageal reflux disease)   . History of hiatal hernia   . Hypertension   .  Osteoporosis   . Peripheral arterial disease (HCC)   . Restless leg syndrome   . Thyroid disease     Past Surgical History:  Procedure Laterality Date  . ABDOMINAL AORTAGRAM  05/04/2015   Procedure: Abdominal Aortagram;  Surgeon: Runell Gess, MD;  Location: [facility] INVASIVE CV LAB;  Service: Cardiovascular;;  . APPENDECTOMY    . CATARACT EXTRACTION W/ INTRAOCULAR LENS  IMPLANT, BILATERAL Bilateral   . I & D EXTREMITY Right 12/22/2016   Procedure: IRRIGATION AND DEBRIDEMENT EXTREMITY;  Surgeon: Mack Hook, MD;  Location: [facility] OR;  Service: Orthopedics;  Laterality: Right;  . PERIPHERAL VASCULAR CATHETERIZATION N/A 05/04/2015   Procedure: Lower Extremity Angiography;  Surgeon: Runell Gess, MD;  Location: [facility] INVASIVE CV LAB;  Service: Cardiovascular;  Laterality: N/A;  . PERIPHERAL VASCULAR CATHETERIZATION  05/04/2015   Procedure: Peripheral Vascular Intervention;  Surgeon: Runell Gess, MD;  Location: [facility] INVASIVE CV LAB;  Service: Cardiovascular;;  RCIA - 7x22 ICAST  . PERIPHERAL VASCULAR CATHETERIZATION Right 09/04/2015   Procedure: Peripheral Vascular Atherectomy;  Surgeon: Runell Gess, MD;  Location: [facility] INVASIVE CV LAB;  Service: Cardiovascular;  Laterality: Right;  SFA  . sfa Right 09/04/2015   de balloon  . THYROID SURGERY Right ?2013   "had goiter taken off my neck"  . TONSILLECTOMY    . VAGINAL HYSTERECTOMY       reports that she quit smoking about 4 years ago. Her smoking use included cigarettes. She has a 30.00 pack-year smoking history. She has never used smokeless tobacco. She reports that she does not drink alcohol or use  drugs.  Allergies  Allergen Reactions  . Codeine Other (See Comments)    REACTION: Syncope     Family History  Problem Relation Age of Onset  . Heart disease Father   . Heart attack Father   . Heart disease Mother   . Coronary artery disease Other   . Atrial fibrillation Sister   . Stroke Maternal Grandmother   . Cancer Paternal  Grandmother     Prior to Admission medications   Medication Sig Start Date End Date Taking? Authorizing Provider  Albuterol Sulfate (PROAIR RESPICLICK) 108 (90 Base) MCG/ACT AEPB Inhale 2 puffs into the lungs every 6 (six) hours as needed (shortness of breath from COPD). 12/29/18  Yes Shelva Majestic, MD  aspirin EC 81 MG EC tablet Take 1 tablet (81 mg total) by mouth daily. 05/05/15  Yes Kilroy, Luke K, PA-C  atorvastatin (LIPITOR) 40 MG tablet Take 1 tablet (40 mg total) by mouth daily. 10/07/19  Yes Shelva Majestic, MD  clopidogrel (PLAVIX) 75 MG tablet Take 1 tablet (75 mg total) by mouth daily with breakfast. MUST KEEP APPOINTMENT 07/20/19 WITH DR Allyson Sabal FOR FUTURE REFILLS 06/11/19  Yes Runell Gess, MD  metFORMIN (GLUCOPHAGE) 500 MG tablet TAKE 1 (1) TABLET BY MOUTH DAILY Patient taking differently: Take 500 mg by mouth daily with breakfast.  10/05/19  Yes Shelva Majestic, MD  Multiple Vitamins-Minerals (PRESERVISION AREDS 2 PO) Take 1 tablet by mouth 2 (two) times daily.   Yes [provider]  Olmesartan-amLODIPine-HCTZ (TRIBENZOR) 40-10-12.5 MG TABS Take 1 tablet by mouth every morning. Patient taking differently: Take 1 tablet by mouth daily.  10/05/19  Yes Shelva Majestic, MD  polyethylene glycol [facility]) packet Take 17 g by mouth at bedtime as needed for mild constipation. Reported on 03/14/2016   Yes [provider]  Rotigotine 3 MG/24HR PT24 Place 3 mg onto the skin at bedtime. 03/31/19  Yes Shelva Majestic, MD  atorvastatin (LIPITOR) 40 MG tablet Take 1 tablet (40 mg total) by mouth daily at 6 PM. Patient not taking: Reported on 12/29/2019 10/05/19   Shelva Majestic, MD  ciprofloxacin (CIPRO) 250 MG tablet Take 1 tablet (250 mg total) by mouth every 12 (twelve) hours. Patient not taking: Reported on 12/29/2019 08/20/19   Eustace Moore, MD    Physical Exam: Vitals:   12/29/19 0445 12/29/19 0603 12/29/19 0604 12/29/19 0656  BP:   (!) 120/96  126/65  Pulse: 77   74  Resp: 19 19 15 20   Temp:    (!) 97.5 F (36.4 C)  TempSrc:    Oral  SpO2: 96%  95% 100%    Physical Exam  Constitutional: She is oriented to person, place, and time. She appears well-developed and well-nourished. No distress.  HENT:  Head: Normocephalic.  Eyes: Right eye exhibits no discharge. Left eye exhibits no discharge.  Cardiovascular: Normal rate, regular rhythm and intact distal pulses.  Pulmonary/Chest: Effort normal and breath sounds normal. No respiratory distress. She has no wheezes. She has no rales.  Abdominal: Soft. Bowel sounds are normal. There is abdominal tenderness. There is guarding. There is no rebound.  Right upper quadrant tender to palpation with guarding  Musculoskeletal:        General: No edema.     Cervical back: Neck supple.  Neurological: She is alert and oriented to person, place, and time.  Skin: Skin is warm and dry. She is not diaphoretic.   Labs on Admission: I  have personally reviewed following labs and imaging studies  CBC: Recent Labs  Lab 12/28/19 2330  WBC 14.2*  HGB 12.0  HCT 37.3  MCV 91.9  PLT 273   Basic Metabolic Panel: Recent Labs  Lab 12/28/19 2330  NA 135  K 3.9  CL 99  CO2 23  GLUCOSE 162*  BUN 22  CREATININE 0.86  CALCIUM 8.9   GFR: CrCl cannot be calculated (Unknown ideal weight.). Liver Function Tests: Recent Labs  Lab 12/28/19 2330  AST 22  ALT 16  ALKPHOS 97  BILITOT 0.9  PROT 7.2  ALBUMIN 3.7   Recent Labs  Lab 12/28/19 2330  LIPASE 16   No results for input(s): AMMONIA in the last 168 hours. Coagulation Profile: No results for input(s): INR, PROTIME in the last 168 hours. Cardiac Enzymes: No results for input(s): CKTOTAL, CKMB, CKMBINDEX, TROPONINI in the last 168 hours. BNP (last 3 results) No results for input(s): PROBNP in the last 8760 hours. HbA1C: No results for input(s): HGBA1C in the last 72 hours. CBG: No results for input(s): GLUCAP in the last 168  hours. Lipid Profile: No results for input(s): CHOL, HDL, LDLCALC, TRIG, CHOLHDL, LDLDIRECT in the last 72 hours. Thyroid Function Tests: No results for input(s): TSH, T4TOTAL, FREET4, T3FREE, THYROIDAB in the last 72 hours. Anemia Panel: No results for input(s): VITAMINB12, FOLATE, FERRITIN, TIBC, IRON, RETICCTPCT in the last 72 hours. Urine analysis:    Component Value Date/Time   COLORURINE YELLOW 12/28/2019 2317   APPEARANCEUR CLOUDY (A) 12/28/2019 2317   LABSPEC 1.014 12/28/2019 2317   PHURINE 6.0 12/28/2019 2317   GLUCOSEU NEGATIVE 12/28/2019 2317   HGBUR NEGATIVE 12/28/2019 2317   BILIRUBINUR NEGATIVE 12/28/2019 2317   BILIRUBINUR Negative 07/14/2019 1417   KETONESUR NEGATIVE 12/28/2019 2317   PROTEINUR 30 (A) 12/28/2019 2317   UROBILINOGEN 0.2 08/20/2019 1732   NITRITE NEGATIVE 12/28/2019 2317   LEUKOCYTESUR LARGE (A) 12/28/2019 2317    Radiological Exams on Admission: CT ABDOMEN PELVIS W CONTRAST  Result Date: 12/29/2019 CLINICAL DATA:  Sharp constant right lower quadrant abdominal pain with nausea and constipation EXAM: CT ABDOMEN AND PELVIS WITH CONTRAST TECHNIQUE: Multidetector CT imaging of the abdomen and pelvis was performed using the standard protocol following bolus administration of intravenous contrast. CONTRAST:  100mL OMNIPAQUE IOHEXOL 300 MG/ML  SOLN COMPARISON:  Radiograph Apr 15, 2011, CT lumbar spine January 04, 2011 FINDINGS: Lower chest: Bandlike areas of atelectasis in the lung base with more dependent atelectasis in the posterior right lower lobe. Mild cardiomegaly with coronary artery atherosclerosis. No pericardial effusion. Hepatobiliary: No focal liver lesions are identified. Gallbladder is markedly hydropic mural thickening. There is extensive pericholecystic inflammation with mucosal hyperemia and marked intra and extrahepatic biliary ductal dilatation to the level of the pancreatic head. Inflammation extends inferolaterally to the gallbladder fossa into  the right pericolic gutter as well as with stranding and phlegmon about the proximal duodenal sweep likely secondary to the gallbladder process. Pancreas: Pancreas is largely atrophic. No pancreatic ductal dilatation. Spleen: Normal in size without focal abnormality. Adrenals/Urinary Tract: 1.4 cm nodule in the body of the left adrenal gland is nonspecific. None no right adrenal nodules. 1 cm fluid attenuation cyst seen in the interpolar left kidney. No worrisome renal lesions. No hydronephrosis or urolithiasis. Urinary bladder appears circumferentially thickened with multiple bladder diverticula seen along the right lateral aspect of the bladder. Some hazy stranding is notable about these multiple bladder diverticula. Stomach/Bowel: Distal esophagus and stomach are unremarkable. Stranding adjacent the  proximal duodenum is likely secondary to the gallbladder. No small bowel dilatation or wall thickening. Cecum is slightly displaced into the midline abdomen. The appendix is surgically absent. No colonic dilatation or wall thickening. Scattered colonic diverticula without focal pericolonic inflammation to suggest diverticulitis. Vascular/Lymphatic: Atherosclerotic plaque within the normal caliber aorta. Focal fusiform ectasia of the infrarenal abdominal aorta measuring up to 2.5 cm in diameter. Reactive adenopathy in the upper abdomen. No pathologically enlarged lymph nodes in the abdomen or pelvis. Reproductive: Uterus is surgically absent. No concerning adnexal lesions. Other: No abdominopelvic free fluid or free gas. Stranding and inflammation extends from the gallbladder to the right paracolic gutter. Additional hazy stranding is noted upon the multiple bladder diverticula in the pelvis. No bowel containing hernias. Musculoskeletal: The osseous structures appear diffusely demineralized which may limit detection of small or nondisplaced fractures. Multilevel degenerative changes are present in the imaged portions  of the spine. 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 No acute osseous abnormality or suspicious osseous lesion. IMPRESSION: 1. Markedly hydropic gallbladder with extensive pericholecystic inflammation and marked intra and extrahepatic biliary ductal dilatation to the level of the pancreatic head. No aggressive or invasive features are seen. Findings are compatible with acute cholecystitis. 2. Extensive intra and extrahepatic biliary ductal dilatation with acute tapering at the level of the pancreatic head. No visible gallstones. Could consider right upper quadrant ultrasound or MRCP for further evaluation. 3. Urinary bladder appears circumferentially thickened with multiple bladder diverticula seen along the right lateral aspect of the bladder. Some hazy stranding is noted about these bladder diverticula. Findings are suggestive of cystitis. Correlate with urinalysis. 4. Indeterminate 1.4 cm nodule in the body of the left adrenal gland. If there is no history of malignancy, further characterization could be performed with adrenal mass protocol CT or MRI. This recommendation follows ACR consensus guidelines: Management of Incidental Adrenal Masses: A White Paper of the ACR Incidental Findings Committee. J Am Coll Radiol 2017;14:1038-1044. 5. Focal fusiform ectasia of the infrarenal abdominal aorta measuring up to 2.5 cm in diameter. Ectatic abdominal aorta at risk for aneurysm development. Recommend followup by ultrasound in 5 years. This recommendation follows ACR consensus guidelines: White Paper of the ACR Incidental Findings Committee II on Vascular Findings. J Am Coll Radiol 2013; 10:789-794. Aortic aneurysm NOS (ICD10-I71.9) 6.  Aortic Atherosclerosis (ICD10-I70.0). These results were called by telephone at the time of interpretation on 12/29/2019 at 4:03 am to provider Dr Clayborne Dana, who verbally acknowledged these results. Electronically Signed   By: Kreg Shropshire M.D.   On: 12/29/2019 04:03    EKG: Independently reviewed.  Accelerated junctional rhythm, heart rate 85.  No acute ischemic changes.  Assessment/Plan Principal Problem:   Acute cholecystitis Active Problems:   Atherosclerotic PVD with intermittent claudication (HCC)   UTI (urinary tract infection)   Adrenal nodule (HCC)   Abdominal aortic ectasia (HCC)   Acute cholecystitis Febrile with temperature 100.6 F.  Not tachycardic or hypotensive. WBC count 14.2.  Lactic acid level borderline elevated at 2.1.  Lipase and LFTs normal. CT abdomen pelvis showing markedly hydropic gallbladder with extensive pericholecystic inflammation and marked intra and extrahepatic biliary ductal dilatation to the level of the pancreatic head.  No visible gallstones.  Findings concerning for acute cholecystitis.  -ED provider discussed the case with Dr. Myrtie Neither from GI who did not feel the need for MRCP or ERCP at this time as it would delay appropriate care.  He recommended urgent surgical consultation.  Case  was also discussed with Dr. Georgette Dover from general surgery, will consult in a.m.  Recommended holding Plavix. -Keep n.p.o. -IV fluid hydration -Continue ceftriaxone and Flagyl -Antiemetic as needed for nausea -Morphine as needed for pain -Continue to monitor WBC count -Blood culture x2 pending -Repeat lactate level  UTI UA with large amount of leukocytes, greater than 50 WBCs, and many bacteria.  -Ceftriaxone -Urine culture pending  Adrenal nodule CT showing indeterminate 1.4 cm nodule in the body of the left adrenal gland. -Patient will need adrenal MRI or CT for further evaluation after management of acute cholecystitis.  Ectasia of abdominal aorta CT showing focal fusiform ectasia of the infrarenal abdominal aorta measuring up to 2.5 cm in diameter. -Patient will need follow-up ultrasound in 5 years per ACR consensus guidelines  Hypertension -Stable.  Currently  normotensive.  Atherosclerotic PVD with intermittent claudication Followed by Dr. Gwenlyn Found from cardiology. -Hold Plavix at this time given acute cholecystitis and need for surgical intervention/cholecystectomy  Prediabetes A1c 6.1 in November 2020.  Takes Metformin at home. -Sliding scale insulin very sensitive every 4 hours as currently n.p.o. and CBG checks. -Hold Metformin  DVT prophylaxis: SCDs at this time Code Status: Patient wishes to be DNR. Family Communication: No family available at this time. Disposition Plan: Anticipate discharge after clinical improvement. Consults called: None Admission status: It is my clinical opinion that admission to INPATIENT is reasonable and necessary in this 86 y.o. female . presenting with fever and abdominal pain secondary to acute cholecystitis.  Pending surgical evaluation in a.m. Very high risk of decompensation.  Given the aforementioned, the predictability of an adverse outcome is felt to be significant. I expect that the patient will require at least 2 midnights in the hospital to treat this condition.   The medical decision making on this patient was of high complexity and the patient is at high risk for clinical deterioration, therefore this is a level 3 visit.  Shela Leff MD [facility]  If 7PM-7AM, please contact night-coverage [email or web address] Password [email or web address]  12/29/2019, 7:09 AM

## 2019-12-29 NOTE — ED Notes (Signed)
Attempted report 1x

## 2019-12-29 NOTE — Consult Note (Signed)
[facility] Surgery Consult Note  DHRUTI GHUMAN 07/13/1931  [id number].    Requesting MD: Arva Chafe Chief Complaint/Reason for Consult: cholecystitis  HPI:  Cheryl Atkinson is an 86yo female with multiple medical problems including PVD on plavix, COPD, HTN, HLD, pre-DM, and recurrent UTIs who presented to [facility] last night with worsening abdominal pain. States that the pain started yesterday morning. She had eaten steak for dinner the night before. States that she has never had pain like this before. Pain is mostly RUQ, it radiates across her upper abdomen and into her back. Associated with nausea and TMAX 100.6. No emesis, diarrhea, chest pain, shortness of breath, or cough. She does report also having gradually worsening dysuria. She has a long history of recurrent UTIs, immediately prior to admission she was not on antibiotics for this.  ED workup included CT scan which showed acalculous hydropic gallbladder with extensive pericholecystic inflammation and marked intra and extrahepatic biliary ductal dilatation to the level of the pancreatic head. LFTs and lipase are WNL. WBC 14.2, lactic acid 2.1.  U/a shows large leukocytes, protein, >50 WBC, and many bacteria.  Respiratory panel negative for covid or influenza.  Patient was started on rocephin/flagyl. She was admitted to the hospitalist service. General surgery asked to see.  Abdominal surgical history: open appendectomy, vaginal hysterectomy Quit smoking 2 years ago Lives at home with her son Uses walker for ambulation, fairly sedentary during the day  Review of Systems  Constitutional: Positive for fever. Negative for chills.  HENT: Negative.   Eyes: Negative.   Respiratory: Negative.   Cardiovascular: Negative.   Gastrointestinal: Positive for abdominal pain, constipation and nausea. Negative for vomiting.  Genitourinary: Positive for dysuria. Negative for flank pain.  Musculoskeletal: Positive for back pain, falls  and joint pain.  Skin: Negative.   Neurological: Negative.    All systems reviewed and otherwise negative except for as above  Family History  Problem Relation Age of Onset  . Heart disease Father   . Heart attack Father   . Heart disease Mother   . Coronary artery disease Other   . Atrial fibrillation Sister   . Stroke Maternal Grandmother   . Cancer Paternal Grandmother     Past Medical History:  Diagnosis Date  . Anemia   . Arthritis    "shoulders" (05/04/2015)  . Cellulitis of right lower extremity 11/27/2013  . Chronic lower back pain   . Constipation   . GERD (gastroesophageal reflux disease)   . History of hiatal hernia   . Hypertension   . Osteoporosis   . Peripheral arterial disease (HCC)   . Restless leg syndrome   . Thyroid disease     Past Surgical History:  Procedure Laterality Date  . ABDOMINAL AORTAGRAM  05/04/2015   Procedure: Abdominal Aortagram;  Surgeon: Runell Gess, MD;  Location: [facility] INVASIVE CV LAB;  Service: Cardiovascular;;  . APPENDECTOMY    . CATARACT EXTRACTION W/ INTRAOCULAR LENS  IMPLANT, BILATERAL Bilateral   . I & D EXTREMITY Right 12/22/2016   Procedure: IRRIGATION AND DEBRIDEMENT EXTREMITY;  Surgeon: Mack Hook, MD;  Location: [facility] OR;  Service: Orthopedics;  Laterality: Right;  . PERIPHERAL VASCULAR CATHETERIZATION N/A 05/04/2015   Procedure: Lower Extremity Angiography;  Surgeon: Runell Gess, MD;  Location: [facility] INVASIVE CV LAB;  Service: Cardiovascular;  Laterality: N/A;  . PERIPHERAL VASCULAR CATHETERIZATION  05/04/2015   Procedure: Peripheral Vascular Intervention;  Surgeon: Runell Gess, MD;  Location: [facility] INVASIVE CV LAB;  Service:  Cardiovascular;;  RCIA - 7x22 ICAST  . PERIPHERAL VASCULAR CATHETERIZATION Right 09/04/2015   Procedure: Peripheral Vascular Atherectomy;  Surgeon: Runell GessJonathan J Berry, MD;  [facility] INVAS[facility] CV LAB;  Service: Cardiovascular;  Laterality: Right;  SFA  . sfa Right 09/04/2015   de balloon  . THYROID  SURGERY Right ?2013   "had goiter taken off my neck"  . TONSILLECTOMY    . VAGINAL HYSTERECTOMY      Social History:  reports that she quit smoking about 4 years ago. Her smoking use included cigarettes. She has a 30.00 pack-year smoking history. She has never used smokeless tobacco. She reports that she does not drink alcohol or use drugs.  Allergies:  Allergies  Allergen Reactions  . Codeine Other (See Comments)    REACTION: Syncope     Medications Prior to Admission  Medication Sig Dispense Refill  . Albuterol Sulfate (PROAIR RESPICLICK) 108 (90 Base) MCG/ACT AEPB Inhale 2 puffs into the lungs every 6 (six) hours as needed (shortness of breath from COPD). 1 each 5  . aspirin EC 81 MG EC tablet Take 1 tablet (81 mg total) by mouth daily.    Marland Kitchen. atorvastatin (LIPITOR) 40 MG tablet Take 1 tablet (40 mg total) by mouth daily. 90 tablet 3  . clopidogrel (PLAVIX) 75 MG tablet Take 1 tablet (75 mg total) by mouth daily with breakfast. MUST KEEP APPOINTMENT 07/20/19 WITH DR BERRY FOR FUTURE REFILLS 90 tablet 0  . metFORMIN (GLUCOPHAGE) 500 MG tablet TAKE 1 (1) TABLET BY MOUTH DAILY (Patient taking differently: Take 500 mg by mouth daily with breakfast. ) 90 tablet 3  . Multiple Vitamins-Minerals (PRESERVISION AREDS 2 PO) Take 1 tablet by mouth 2 (two) times daily.    . Olmesartan-amLODIPine-HCTZ (TRIBENZOR) 40-10-12.5 MG TABS Take 1 tablet by mouth every morning. (Patient taking differently: Take 1 tablet by mouth daily. ) 90 tablet 3  . polyethylene glycol (MIRALAX / GLYCOLAX) packet Take 17 g by mouth at bedtime as needed for mild constipation. Reported on 03/14/2016    . Rotigotine 3 MG/24HR PT24 Place 3 mg onto the skin at bedtime. 90 patch 3  . atorvastatin (LIPITOR) 40 MG tablet Take 1 tablet (40 mg total) by mouth daily at 6 PM. (Patient not taking: Reported on 12/29/2019) 90 tablet 3  . ciprofloxacin (CIPRO) 250 MG tablet Take 1 tablet (250 mg total) by mouth every 12 (twelve) hours.  (Patient not taking: Reported on 12/29/2019) 10 tablet 0    Prior to Admission medications   Medication Sig Start Date End Date Taking? Authorizing Provider  Albuterol Sulfate (PROAIR RESPICLICK) 108 (90 Base) MCG/ACT AEPB Inhale 2 puffs into the lungs every 6 (six) hours as needed (shortness of breath from COPD). 12/29/18  Yes Shelva MajesticHunter, Stephen O, MD  aspirin EC 81 MG EC tablet Take 1 tablet (81 mg total) by mouth daily. 05/05/15  Yes Kilroy, Luke K, PA-C  atorvastatin (LIPITOR) 40 MG tablet Take 1 tablet (40 mg total) by mouth daily. 10/07/19  Yes Shelva MajesticHunter, Stephen O, MD  clopidogrel (PLAVIX) 75 MG tablet Take 1 tablet (75 mg total) by mouth daily with breakfast. MUST KEEP APPOINTMENT 07/20/19 WITH DR Allyson SabalBERRY FOR FUTURE REFILLS 06/11/19  Yes Runell GessBerry, Jonathan J, MD  metFORMIN (GLUCOPHAGE) 500 MG tablet TAKE 1 (1) TABLET BY MOUTH DAILY Patient taking differently: Take 500 mg by mouth daily with breakfast.  10/05/19  Yes Shelva MajesticHunter, Stephen O, MD  Multiple Vitamins-Minerals (PRESERVISION AREDS 2 PO) Take 1 tablet by mouth 2 (two)  times daily.   Yes [provider]  Olmesartan-amLODIPine-HCTZ (TRIBENZOR) 40-10-12.5 MG TABS Take 1 tablet by mouth every morning. Patient taking differently: Take 1 tablet by mouth daily.  10/05/19  Yes Shelva Majestic, MD  polyethylene glycol [facility] Ethelene Hal) packet Take 17 g by mouth at bedtime as needed for mild constipation. Reported on 03/14/2016   Yes [provider]  Rotigotine 3 MG/24HR PT24 Place 3 mg onto the skin at bedtime. 03/31/19  Yes Shelva Majestic, MD  atorvastatin (LIPITOR) 40 MG tablet Take 1 tablet (40 mg total) by mouth daily at 6 PM. Patient not taking: Reported on 12/29/2019 10/05/19   Shelva Majestic, MD  ciprofloxacin (CIPRO) 250 MG tablet Take 1 tablet (250 mg total) by mouth every 12 (twelve) hours. Patient not taking: Reported on 12/29/2019 08/20/19   Eustace Moore, MD    Blood pressure 126/65, pulse 74, temperature (!) 97.5 F  (36.4 C), temperature source Oral, resp. rate 20, height 5\' 6"  (1.676 m), weight 65.3 kg, SpO2 100 %. Physical Exam: General: pleasant, frail appearing white female who is laying in bed in NAD HEENT: head is normocephalic, atraumatic.  Neck supple. Sclera are noninjected.  Pupils equal and round.  Ears and nose without any masses or lesions.  Mouth is pink and dray. Edentate. Heart: regular, rate, and rhythm.  No obvious murmurs, gallops, or rubs noted.  Feet WWP bilaterally Lungs: CTAB, no wheezes, rhonchi, or rales noted.  Respiratory effort nonlabored Abd: soft, ND, +BS, no masses, hernias, or organomegaly. Mild diffuse diffuse tenderness with severe RUQ/epigastric tenderness with guarding. Well healed vertical RLQ incision.  MS: calves soft and nontender without edema Skin: warm and dry with no masses, lesions, or rashes Psych: A&Ox3 with an appropriate affect. Neuro: cranial nerves grossly intact, extremity CSM intact bilaterally, moving all 4 extremities, normal speech  Results for orders placed or performed during the hospital encounter of 12/28/19 (from the past 48 hour(s))  Urinalysis, Routine w reflex microscopic     Status: Abnormal   Collection Time: 12/28/19 11:17 PM  Result Value Ref Range   Color, Urine YELLOW YELLOW   APPearance CLOUDY (A) CLEAR   Specific Gravity, Urine 1.014 1.005 - 1.030   pH 6.0 5.0 - 8.0   Glucose, UA NEGATIVE NEGATIVE mg/dL   Hgb urine dipstick NEGATIVE NEGATIVE   Bilirubin Urine NEGATIVE NEGATIVE   Ketones, ur NEGATIVE NEGATIVE mg/dL   Protein, ur 30 (A) NEGATIVE mg/dL   Nitrite NEGATIVE NEGATIVE   Leukocytes,Ua LARGE (A) NEGATIVE   RBC / HPF 0-5 0 - 5 RBC/hpf   WBC, UA >50 (H) 0 - 5 WBC/hpf   Bacteria, UA MANY (A) NONE SEEN   Squamous Epithelial / LPF 0-5 0 - 5    Comment: Performed at [facility], [address] [place], [place]  Lipase, blood     Status: None   Collection Time: 12/28/19 11:30 PM  Result Value Ref Range    Lipase 16 11 - 51 U/L    Comment: Performed at [facility], [address] [place], [place]  Comprehensive metabolic panel     Status: Abnormal   Collection Time: 12/28/19 11:30 PM  Result Value Ref Range   Sodium 135 135 - 145 mmol/L   Potassium 3.9 3.5 - 5.1 mmol/L   Chloride 99 98 - 111 mmol/L   CO2 23 22 - 32 mmol/L   Glucose, Bld 162 (H) 70 - 99  mg/dL   BUN 22 8 - 23 mg/dL   Creatinine, Ser 6.43 0.44 - 1.00 mg/dL   Calcium 8.9 8.9 - 32.9 mg/dL   Total Protein 7.2 6.5 - 8.1 g/dL   Albumin 3.7 3.5 - 5.0 g/dL   AST 22 15 - 41 U/L   ALT 16 0 - 44 U/L   Alkaline Phosphatase 97 38 - 126 U/L   Total Bilirubin 0.9 0.3 - 1.2 mg/dL   GFR calc non Af Amer >60 >60 mL/min   GFR calc Af Amer >60 >60 mL/min   Anion gap 13 5 - 15    Comment: Performed at [facility] Lab, [address] [place], [place] [address]  CBC     Status: Abnormal   Collection Time: 12/28/19 11:30 PM  Result Value Ref Range   WBC 14.2 (H) 4.0 - 10.5 K/uL   RBC 4.06 3.87 - 5.11 MIL/uL   Hemoglobin 12.0 12.0 - 15.0 g/dL   HCT 16.6 06.3 - 01.6 %   MCV 91.9 80.0 - 100.0 fL   MCH 29.6 26.0 - 34.0 pg   MCHC 32.2 30.0 - 36.0 g/dL   RDW 01.0 93.2 - 35.5 %   Platelets 273 150 - 400 K/uL   nRBC 0.0 0.0 - 0.2 %    Comment: Performed at [facility] Lab, [address], [place], [place] [address]  Lactic acid, plasma     Status: Abnormal   Collection Time: 12/29/19  2:43 AM  Result Value Ref Range   Lactic Acid, Venous 2.1 (HH) 0.5 - 1.9 mmol/L    Comment: CRITICAL RESULT CALLED TO, READ BACK BY AND VERIFIED WITH: Jason Coop [facility] 12/29/19 0347 WAYK Performed at [facility] Lab, [address], [place], [place] [address]   Respiratory Panel by RT PCR (Flu A&B, Covid) - Nasopharyngeal Swab     Status: None   Collection Time: 12/29/19  5:04 AM   Specimen: Nasopharyngeal Swab  Result Value Ref Range   SARS Coronavirus 2 by RT PCR NEGATIVE NEGATIVE    Comment: (NOTE) SARS-CoV-2 target nucleic  acids are NOT DETECTED. The SARS-CoV-2 RNA is generally detectable in upper respiratoy specimens during the acute phase of infection. The lowest concentration of SARS-CoV-2 viral copies this assay can detect is 131 copies/mL. A negative result does not preclude SARS-Cov-2 infection and should not be used as the sole basis for treatment or other patient management decisions. A negative result may occur with  improper specimen collection/handling, submission of specimen other than nasopharyngeal swab, presence of viral mutation(s) within the areas targeted by this assay, and inadequate number of viral copies (<131 copies/mL). A negative result must be combined with clinical observations, patient history, and epidemiological information. The expected result is Negative. Fact Sheet for Patients:  https://www.moore.com/ Fact Sheet for Healthcare Providers:  https://www.young.biz/ This test is not yet ap proved or cleared by the [place] FDA and  has been authorized for detection and/or diagnosis of SARS-CoV-2 by FDA under an Emergency Use Authorization (EUA). This EUA will remain  in effect (meaning this test can be used) for the duration of the COVID-19 declaration under Section 564(b)(1) of the Act, 21 U.S.C. section 360bbb-3(b)(1), unless the authorization is terminated or revoked sooner.    Influenza A by PCR NEGATIVE NEGATIVE   Influenza B by PCR NEGATIVE NEGATIVE    Comment: (NOTE) The Xpert Xpress SARS-CoV-2/FLU/RSV assay is intended as an aid in  the diagnosis of influenza from Nasopharyngeal swab specimens  and  should not be used as a sole basis for treatment. Nasal washings and  aspirates are unacceptable for Xpert Xpress SARS-CoV-2/FLU/RSV  testing. Fact Sheet for Patients: https://www.moore.com/ Fact Sheet for Healthcare Providers: https://www.young.biz/ This test is not yet approved or  cleared by the [place] FDA and  has been authorized for detection and/or diagnosis of SARS-CoV-2 by  FDA under an Emergency Use Authorization (EUA). This EUA will remain  in effect (meaning this test can be used) for the duration of the  Covid-19 declaration under Section 564(b)(1) of the Act, 21  U.S.C. section 360bbb-3(b)(1), unless the authorization is  terminated or revoked. Performed at [facility], [address], [place], [place] [address]   Lactic acid, plasma     Status: None   Collection Time: 12/29/19  7:15 AM  Result Value Ref Range   Lactic Acid, Venous 1.6 0.5 - 1.9 mmol/L    Comment: Performed at [facility], [address] [place], [place] [address]  Glucose, capillary     Status: Abnormal   Collection Time: 12/29/19  7:39 AM  Result Value Ref Range   Glucose-Capillary 118 (H) 70 - 99 mg/dL   CT ABDOMEN PELVIS W CONTRAST  Result Date: 12/29/2019 CLINICAL DATA:  Sharp constant right lower quadrant abdominal pain with nausea and constipation EXAM: CT ABDOMEN AND PELVIS WITH CONTRAST TECHNIQUE: Multidetector CT imaging of the abdomen and pelvis was performed using the standard protocol following bolus administration of intravenous contrast. CONTRAST:  OMNIPAQUE IOHEXOL 300 MG/ML  SOLN COMPARISON:  Radiograph Apr 15, 2011, CT lumbar spine January 04, 2011 FINDINGS: Lower chest: Bandlike areas of atelectasis in the lung base with more dependent atelectasis in the posterior right lower lobe. Mild cardiomegaly with coronary artery atherosclerosis. No pericardial effusion. Hepatobiliary: No focal liver lesions are identified. Gallbladder is markedly hydropic mural thickening. There is extensive pericholecystic inflammation with mucosal hyperemia and marked intra and extrahepatic biliary ductal dilatation to the level of the pancreatic head. Inflammation extends inferolaterally to the gallbladder fossa into the right pericolic gutter as well as with stranding  and phlegmon about the proximal duodenal sweep likely secondary to the gallbladder process. Pancreas: Pancreas is largely atrophic. No pancreatic ductal dilatation. Spleen: Normal in size without focal abnormality. Adrenals/Urinary Tract: 1.4 cm nodule in the body of the left adrenal gland is nonspecific. None no right adrenal nodules. 1 cm fluid attenuation cyst seen in the interpolar left kidney. No worrisome renal lesions. No hydronephrosis or urolithiasis. Urinary bladder appears circumferentially thickened with multiple bladder diverticula seen along the right lateral aspect of the bladder. Some hazy stranding is notable about these multiple bladder diverticula. Stomach/Bowel: Distal esophagus and stomach are unremarkable. Stranding adjacent the proximal duodenum is likely secondary to the gallbladder. No small bowel dilatation or wall thickening. Cecum is slightly displaced into the midline abdomen. The appendix is surgically absent. No colonic dilatation or wall thickening. Scattered colonic diverticula without focal pericolonic inflammation to suggest diverticulitis. Vascular/Lymphatic: Atherosclerotic plaque within the normal caliber aorta. Focal fusiform ectasia of the infrarenal abdominal aorta measuring up to 2.5 cm in diameter. Reactive adenopathy in the upper abdomen. No pathologically enlarged lymph nodes in the abdomen or pelvis. Reproductive: Uterus is surgically absent. No concerning adnexal lesions. Other: No abdominopelvic free fluid or free gas. Stranding and inflammation extends from the gallbladder to the right paracolic gutter. Additional hazy stranding is noted upon the multiple bladder diverticula in the pelvis. No bowel containing hernias. Musculoskeletal: The  osseous structures appear diffusely demineralized which may limit detection of small or nondisplaced fractures. Multilevel degenerative changes are present in the imaged portions of the spine. 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 No acute osseous abnormality or suspicious osseous lesion. IMPRESSION: 1. Markedly hydropic gallbladder with extensive pericholecystic inflammation and marked intra and extrahepatic biliary ductal dilatation to the level of the pancreatic head. No aggressive or invasive features are seen. Findings are compatible with acute cholecystitis. 2. Extensive intra and extrahepatic biliary ductal dilatation with acute tapering at the level of the pancreatic head. No visible gallstones. Could consider right upper quadrant ultrasound or MRCP for further evaluation. 3. Urinary bladder appears circumferentially thickened with multiple bladder diverticula seen along the right lateral aspect of the bladder. Some hazy stranding is noted about these bladder diverticula. Findings are suggestive of cystitis. Correlate with urinalysis. 4. Indeterminate 1.4 cm nodule in the body of the left adrenal gland. If there is no history of malignancy, further characterization could be performed with adrenal mass protocol CT or MRI. This recommendation follows ACR consensus guidelines: Management of Incidental Adrenal Masses: A White Paper of the ACR Incidental Findings Committee. J Am Coll Radiol 2017;14:1038-1044. 5. Focal fusiform ectasia of the infrarenal abdominal aorta measuring up to 2.5 cm in diameter. Ectatic abdominal aorta at risk for aneurysm development. Recommend followup by ultrasound in 5 years. This recommendation follows ACR consensus guidelines: White Paper of the ACR Incidental Findings Committee II on Vascular Findings. J Am Coll Radiol 2013; 10:789-794. Aortic aneurysm NOS (ICD10-I71.9) 6.  Aortic Atherosclerosis (ICD10-I70.0). These results were called by telephone at the time of interpretation on 12/29/2019 at 4:03 am to provider Dr Dayna Barker, who verbally acknowledged these results. Electronically Signed   By: Lovena Le M.D.   On: 12/29/2019 04:03    Anti-infectives (From admission, onward)   Start     Dose/Rate Route Frequency Ordered Stop   12/29/19 1200  cefTRIAXone (ROCEPHIN) 2 g in sodium chloride 0.9 % 100 mL IVPB     2 g 200 mL/hr over 30 Minutes Intravenous Every 24 hours 12/29/19 0623     12/29/19 1000  metroNIDAZOLE (FLAGYL) IVPB 500 mg     500 mg 100 mL/hr over 60 Minutes Intravenous Every 8 hours 12/29/19 0623     12/29/19 0415  metroNIDAZOLE (FLAGYL) IVPB 500 mg     500 mg 100 mL/hr over 60 Minutes Intravenous  Once 12/29/19 0401 12/29/19 0510   12/29/19 0245  cefTRIAXone (ROCEPHIN) 1 g in sodium chloride 0.9 % 100 mL IVPB     1 g 200 mL/hr over 30 Minutes Intravenous  Once 12/29/19 0231 12/29/19 0346        Assessment/Plan GERD HTN HLD PVD on plavix - followed by Dr. Gwenlyn Found, hold plavix COPD Pre-DM - on metformin. A1c 6.1 (10/2019) Hypothyroidism RLS Code status DNR  Recurrent UTIs UTI - Ucx pending, per primary  Abdominal pain/nausea Acute cholecystitis  - Patient with onset of upper abdominal pain yesterday morning associated with nausea. CT scan shows acalculous hydropic gallbladder with extensive pericholecystic inflammation and marked intra and extrahepatic biliary ductal dilatation to the level of the pancreatic head. LFTs are WNL. Will first obtain MRCP for further evaluation of ductal dilatation and to rule out a choledocholithiasis. If this is negative, plan for HIDA to confirm cholecystitis. If cystic duct is obstructed we discussed laparoscopic cholecystectomy versus percutaneous cholecystostomy tube placement. With her age and multiple co-morbidities patient would be high risk with  general anesthesia and surgical intervention. If needed, we would recommend a perc chole tube. Recheck labs in AM. Switch antibiotics to zosyn.   ID - rocephin/flagyl 1/27>>1/27. Zosyn 1/27>> VTE - SCDs, ok for chemical DVT prophylaxis from surgical standpoint FEN - IVF, NPO Foley - none Follow up - TBD  Franne FortsBrooke A  Shawntel Farnworth, [facility]A-C [facility] 12/29/2019, 11:08 AM Please see Amion for pager number during day hours 7:00am-4:30pm

## 2019-12-29 NOTE — Progress Notes (Signed)
Received patient from ED, AOx4, VSS, on purewick, placed on telemetry 5N01, denies pain with no movement, oriented to room, call light and plan of care.  Will endorse to day shift RN appropriately.

## 2019-12-29 NOTE — ED Notes (Signed)
MD informed of elevated lactic

## 2019-12-29 NOTE — TOC Initial Note (Addendum)
Transition of Care [facility]) - Initial/Assessment Note    Patient Details  Name: Cheryl Atkinson MRN: [id number] Date of Birth: 26-May-1931  Transition of Care [facility]) CM/SW Contact:    Marilu Favre, RN Phone Number: 12/29/2019, 3:54 PM  Clinical Narrative:                 Spoke with patient at bedside. Patient from home with son, [facility] for HHRN,PT. Patient already has walker and cane at home, has handicap bathroom.   Requested orders. Cassie with [facility] following  Expected Discharge Plan: [place] Barriers to Discharge: Continued Medical Work up   Patient Goals and CMS Choice Patient states their goals for this hospitalization and ongoing recovery are:: to return to home CMS Medicare.gov Compare Post Acute Care list provided to:: Patient Choice offered to / list presented to : Patient  Expected Discharge Plan and Services Expected Discharge Plan: [place]   Discharge Planning Services: CM Consult Post Acute Care Choice: [place] arrangements for the past 2 months: Single Family Home                 DME Arranged: N/A         HH Arranged: RN, PT [place] Agency: [facility] Date [place]: 12/29/19 Time [place]: 7035 Representative spoke with at [place]: [place] Arrangements/Services Living arrangements for the past 2 months: [place] with:: Adult Children Patient language and need for interpreter reviewed:: Yes Do you feel safe going back to the place where you live?: Yes      Need for Family Participation in Patient Care: Yes (Comment) Care giver support system in place?: Yes (comment) Current home services: DME, Home PT, Home RN Criminal Activity/Legal Involvement Pertinent to Current Situation/Hospitalization: No - Comment as needed  Activities of Daily Living      Permission Sought/Granted   Permission granted to share information  with : No              Emotional Assessment Appearance:: Appears stated age Attitude/Demeanor/Rapport: Engaged Affect (typically observed): Accepting Orientation: : Oriented to Situation, Oriented to  Time, Oriented to Place, Oriented to Self Alcohol / Substance Use: Not Applicable Psych Involvement: No (comment)  Admission diagnosis:  Acute cholecystitis [K81.0] Acute cholecystitis without calculus [K81.0] Cystitis [N30.90] Sepsis, due to unspecified organism, unspecified whether acute organ dysfunction present [facility]) [A41.9] Patient Active Problem List   Diagnosis Date Noted  . Acute cholecystitis 12/29/2019  . UTI (urinary tract infection) 12/29/2019  . Adrenal nodule ([place]) 12/29/2019  . Abdominal aortic ectasia ([place]) 12/29/2019  . Sepsis ([place]) 12/29/2019  . Major depression 03/25/2018  . Cat bite of right hand 12/21/2016  . BPPV (benign paroxysmal positional vertigo) 07/12/2016  . Memory loss 04/11/2016  . Mallet toe of right foot 10/20/2015  . Renal artery stenosis ([place]) 09/27/2015  . Hyperlipidemia 06/30/2015  . Claudication ([place]) 05/04/2015  . Atherosclerotic PVD with intermittent claudication ([place]) 04/26/2015  . Tinnitus 12/31/2014  . Former smoker 09/29/2014  . DNR (do not resuscitate) 09/29/2014  . Insulin resistance 07/19/2014  . Multinodular goiter 08/30/2013  . Spinal stenosis of lumbar region at multiple levels 09/29/2012  . HIP PAIN, BILATERAL 07/16/2010  . COPD (chronic obstructive pulmonary disease) ([place]) 09/20/2009  . CONSTIPATION, CHRONIC 09/20/2009  . Iron deficiency anemia 11/09/2007  . History of UTI 11/09/2007  . RESTLESS LEG SYNDROME 09/25/2007  . Essential hypertension  09/25/2007  . GERD 09/25/2007  . LOW BACK PAIN 09/25/2007  . Osteoporosis 09/25/2007   PCP:  Shelva Majestic, MD Pharmacy:   [facility] - [address], [place] - [address] [place] [address] Phone: [phone] Fax:  [phone]  [id number] [place], [place] - [address] AT [facility] [address] & [address] [place] [address] Phone: [phone] Fax: [phone]  [facility] - [place], [place] - [address] [place] [address] Phone: [phone] Fax: [phone]     Noted. Agree with above.  Jilda Roche [place], DO 12/29/19 3:57 PM

## 2019-12-29 NOTE — ED Provider Notes (Signed)
[place] EMERGENCY DEPARTMENT Provider Note   CSN: [id number] Arrival date & time: 12/28/19  2310     History Chief Complaint  Patient presents with  . Abdominal Pain    Cheryl Atkinson is a 86 y.o. female.  Patient on ASA/Plavix. Denies other use of anticoagulants.   The history is provided by the patient. No language interpreter was used.  Abdominal Pain Pain location: began epigastric, "like indigestion" and moved to RLQ. Pain quality: sharp   Pain radiates to:  Does not radiate Pain severity:  Moderate Onset quality:  Gradual Duration:  2 days Timing:  Constant Progression:  Worsening Chronicity:  New Context: not sick contacts and not suspicious food intake   Relieved by:  Nothing Ineffective treatments: Taking home medications without relief. Associated symptoms: constipation and nausea   Associated symptoms: no dysuria, no fever and no vomiting   Risk factors: being elderly        Past Medical History:  Diagnosis Date  . Anemia   . Arthritis    "shoulders" (05/04/2015)  . Cellulitis of right lower extremity 11/27/2013  . Chronic lower back pain   . Constipation   . GERD (gastroesophageal reflux disease)   . History of hiatal hernia   . Hypertension   . Osteoporosis   . Peripheral arterial disease ([place])   . Restless leg syndrome   . Thyroid disease     Patient Active Problem List   Diagnosis Date Noted  . Major depression 03/25/2018  . Cat bite of right hand 12/21/2016  . BPPV (benign paroxysmal positional vertigo) 07/12/2016  . Memory loss 04/11/2016  . Mallet toe of right foot 10/20/2015  . Renal artery stenosis ([place]) 09/27/2015  . Hyperlipidemia 06/30/2015  . Claudication ([place]) 05/04/2015  . Atherosclerotic PVD with intermittent claudication ([place]) 04/26/2015  . Tinnitus 12/31/2014  . Former smoker 09/29/2014  . DNR (do not resuscitate) 09/29/2014  . Insulin resistance 07/19/2014  . Multinodular goiter 08/30/2013  .  Spinal stenosis of lumbar region at multiple levels 09/29/2012  . HIP PAIN, BILATERAL 07/16/2010  . COPD (chronic obstructive pulmonary disease) ([place]) 09/20/2009  . CONSTIPATION, CHRONIC 09/20/2009  . Iron deficiency anemia 11/09/2007  . History of UTI 11/09/2007  . RESTLESS LEG SYNDROME 09/25/2007  . Essential hypertension 09/25/2007  . GERD 09/25/2007  . LOW BACK PAIN 09/25/2007  . Osteoporosis 09/25/2007    Past Surgical History:  Procedure Laterality Date  . ABDOMINAL AORTAGRAM  05/04/2015   Procedure: Abdominal Aortagram;  Surgeon: Lorretta Harp, MD;  Location: [place] [facility];  Service: Cardiovascular;;  . APPENDECTOMY    . CATARACT EXTRACTION W/ INTRAOCULAR LENS  IMPLANT, BILATERAL Bilateral   . I & D EXTREMITY Right 12/22/2016   Procedure: IRRIGATION AND DEBRIDEMENT EXTREMITY;  Surgeon: Milly Jakob, MD;  Location: [place];  Service: Orthopedics;  Laterality: Right;  . PERIPHERAL VASCULAR CATHETERIZATION N/A 05/04/2015   Procedure: Lower Extremity Angiography;  Surgeon: Lorretta Harp, MD;  Location: [place] [facility];  Service: Cardiovascular;  Laterality: N/A;  . PERIPHERAL VASCULAR CATHETERIZATION  05/04/2015   Procedure: Peripheral Vascular Intervention;  Surgeon: Lorretta Harp, MD;  Location: [place] [facility];  Service: Cardiovascular;;  RCIA - 7x22 ICAST  . PERIPHERAL VASCULAR CATHETERIZATION Right 09/04/2015   Procedure: Peripheral Vascular Atherectomy;  Surgeon: Lorretta Harp, MD;  Location: [place] [facility];  Service: Cardiovascular;  Laterality: Right;  SFA  . sfa Right 09/04/2015   de balloon  . THYROID  SURGERY Right ?2013   "had goiter taken off my neck"  . TONSILLECTOMY    . VAGINAL HYSTERECTOMY       OB History   No obstetric history on file.     Family History  Problem Relation Age of Onset  . Heart disease Father   . Heart attack Father   . Heart disease Mother   . Coronary artery disease Other   . Atrial fibrillation Sister   . Stroke  Maternal Grandmother   . Cancer Paternal Grandmother     Social History   Tobacco Use  . Smoking status: Former Smoker    Packs/day: 0.50    Years: 60.00    Pack years: 30.00    Types: Cigarettes    Quit date: 04/02/2015    Years since quitting: 4.7  . Smokeless tobacco: Never Used  Substance Use Topics  . Alcohol use: No  . Drug use: No    Home Medications Prior to Admission medications   Medication Sig Start Date End Date Taking? Authorizing Provider  Albuterol Sulfate (PROAIR RESPICLICK) 108 (90 Base) MCG/ACT AEPB Inhale 2 puffs into the lungs every 6 (six) hours as needed (shortness of breath from COPD). 12/29/18   Shelva Majestic, MD  aspirin EC 81 MG EC tablet Take 1 tablet (81 mg total) by mouth daily. 05/05/15   Abelino Derrick, PA-C  atorvastatin (LIPITOR) 40 MG tablet Take 1 tablet (40 mg total) by mouth daily at 6 PM. 10/05/19   Shelva Majestic, MD  atorvastatin (LIPITOR) 40 MG tablet Take 1 tablet (40 mg total) by mouth daily. 10/07/19   Shelva Majestic, MD  ciprofloxacin (CIPRO) 250 MG tablet Take 1 tablet (250 mg total) by mouth every 12 (twelve) hours. 08/20/19   Eustace Moore, MD  clopidogrel (PLAVIX) 75 MG tablet Take 1 tablet (75 mg total) by mouth daily with breakfast. MUST KEEP APPOINTMENT 07/20/19 WITH DR Allyson Sabal FOR FUTURE REFILLS 06/11/19   Runell Gess, MD  metFORMIN (GLUCOPHAGE) 500 MG tablet TAKE 1 (1) TABLET BY MOUTH DAILY 10/05/19   Shelva Majestic, MD  Multiple Vitamins-Minerals (PRESERVISION AREDS 2 PO) Take 1 tablet by mouth 2 (two) times daily.    [provider]  Olmesartan-amLODIPine-HCTZ (TRIBENZOR) 40-10-12.5 MG TABS Take 1 tablet by mouth every morning. 10/05/19   Shelva Majestic, MD  polyethylene glycol [facility] / Ethelene Hal) packet Take 17 g by mouth at bedtime as needed for mild constipation. Reported on 03/14/2016    [provider]  Rotigotine 3 MG/24HR PT24 Place 3 mg onto the skin at bedtime. 03/31/19   Shelva Majestic,  MD  traMADol (ULTRAM) 50 MG tablet Take 50 mg by mouth at bedtime as needed for moderate pain.    [provider]  traMADol (ULTRAM-ER) 300 MG 24 hr tablet Take 300 mg by mouth daily.    [provider]    Allergies    Codeine  Review of Systems   Review of Systems  Constitutional: Negative for fever.  Gastrointestinal: Positive for abdominal pain, constipation and nausea. Negative for vomiting.  Genitourinary: Negative for dysuria.  Ten systems reviewed and are negative for acute change, except as noted in the HPI.    Physical Exam Updated Vital Signs BP (!) 130/56   Pulse 81   Temp 99.8 F (37.7 C) (Oral)   Resp 19   LMP  (LMP Unknown)   SpO2 97%   Physical Exam Vitals and nursing note reviewed.  Constitutional:      General: She is not in acute distress.    Appearance: She is well-developed. She is not diaphoretic.     Comments: Nontoxic appearing and in NAD  HENT:     Head: Normocephalic and atraumatic.  Eyes:     General: No scleral icterus.    Conjunctiva/sclera: Conjunctivae normal.  Cardiovascular:     Rate and Rhythm: Normal rate and regular rhythm.     Pulses: Normal pulses.  Pulmonary:     Effort: Pulmonary effort is normal. No respiratory distress.     Comments: Respirations even and unlabored Abdominal:     Palpations: Abdomen is soft. There is no mass.     Tenderness: There is abdominal tenderness.     Comments: Soft abdomen with diffuse TTP. This is subjectively worse in the R hemiabdomen with mild guarding. No peritoneal signs or palpable masses.  Musculoskeletal:        General: Normal range of motion.     Cervical back: Normal range of motion.  Skin:    General: Skin is warm and dry.     Coloration: Skin is not pale.     Findings: No erythema or rash.  Neurological:     Mental Status: She is alert and oriented to person, place, and time.  Psychiatric:        Behavior: Behavior normal.     ED Results / Procedures /  Treatments   Labs (all labs ordered are listed, but only abnormal results are displayed) Labs Reviewed  COMPREHENSIVE METABOLIC PANEL - Abnormal; Notable for the following components:      Result Value   Glucose, Bld 162 (*)    All other components within normal limits  CBC - Abnormal; Notable for the following components:   WBC 14.2 (*)    All other components within normal limits  URINALYSIS, ROUTINE W REFLEX MICROSCOPIC - Abnormal; Notable for the following components:   APPearance CLOUDY (*)    Protein, ur 30 (*)    Leukocytes,Ua LARGE (*)    WBC, UA >50 (*)    Bacteria, UA MANY (*)    All other components within normal limits  LACTIC ACID, PLASMA - Abnormal; Notable for the following components:   Lactic Acid, Venous 2.1 (*)    All other components within normal limits  URINE CULTURE  CULTURE, BLOOD (ROUTINE X 2)  CULTURE, BLOOD (ROUTINE X 2)  RESPIRATORY PANEL BY RT PCR (FLU A&B, COVID)  LIPASE, BLOOD    EKG EKG Interpretation  Date/Time:  Tuesday December 28 2019 23:19:25 EST Ventricular Rate:  85 PR Interval:    QRS Duration: 90 QT Interval:  358 QTC Calculation: 426 R Axis:   -39 Text Interpretation: Accelerated Junctional rhythm Left axis deviation Cannot rule out Anterior infarct , age undetermined Abnormal ECG new LAD, no other changes Confirmed by Marily MemosMesner, Jason [phone]([id number]) on 12/29/2019 2:20:28 AM   Radiology CT ABDOMEN PELVIS W CONTRAST  Result Date: 12/29/2019 CLINICAL DATA:  Lambert ModySharp constant right lower quadrant abdominal pain with nausea and constipation EXAM: CT ABDOMEN AND PELVIS WITH CONTRAST TECHNIQUE: Multidetector CT imaging of the abdomen and pelvis was performed using the standard protocol following bolus administration of intravenous contrast. CONTRAST:  100mL OMNIPAQUE IOHEXOL 300 MG/ML  SOLN COMPARISON:  Radiograph Apr 15, 2011, CT lumbar spine January 04, 2011 FINDINGS: Lower chest: Bandlike areas of atelectasis in the lung base with more dependent  atelectasis in the posterior right lower lobe. Mild cardiomegaly with coronary artery  atherosclerosis. No pericardial effusion. Hepatobiliary: No focal liver lesions are identified. Gallbladder is markedly hydropic mural thickening. There is extensive pericholecystic inflammation with mucosal hyperemia and marked intra and extrahepatic biliary ductal dilatation to the level of the pancreatic head. Inflammation extends inferolaterally to the gallbladder fossa into the right pericolic gutter as well as with stranding and phlegmon about the proximal duodenal sweep likely secondary to the gallbladder process. Pancreas: Pancreas is largely atrophic. No pancreatic ductal dilatation. Spleen: Normal in size without focal abnormality. Adrenals/Urinary Tract: 1.4 cm nodule in the body of the left adrenal gland is nonspecific. None no right adrenal nodules. 1 cm fluid attenuation cyst seen in the interpolar left kidney. No worrisome renal lesions. No hydronephrosis or urolithiasis. Urinary bladder appears circumferentially thickened with multiple bladder diverticula seen along the right lateral aspect of the bladder. Some hazy stranding is notable about these multiple bladder diverticula. Stomach/Bowel: Distal esophagus and stomach are unremarkable. Stranding adjacent the proximal duodenum is likely secondary to the gallbladder. No small bowel dilatation or wall thickening. Cecum is slightly displaced into the midline abdomen. The appendix is surgically absent. No colonic dilatation or wall thickening. Scattered colonic diverticula without focal pericolonic inflammation to suggest diverticulitis. Vascular/Lymphatic: Atherosclerotic plaque within the normal caliber aorta. Focal fusiform ectasia of the infrarenal abdominal aorta measuring up to 2.5 cm in diameter. Reactive adenopathy in the upper abdomen. No pathologically enlarged lymph nodes in the abdomen or pelvis. Reproductive: Uterus is surgically absent. No concerning  adnexal lesions. Other: No abdominopelvic free fluid or free gas. Stranding and inflammation extends from the gallbladder to the right paracolic gutter. Additional hazy stranding is noted upon the multiple bladder diverticula in the pelvis. No bowel containing hernias. Musculoskeletal: The osseous structures appear diffusely demineralized which may limit detection of small or nondisplaced fractures. Multilevel degenerative changes are present in the imaged portions of the spine. 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 No acute osseous abnormality or suspicious osseous lesion. IMPRESSION: 1. Markedly hydropic gallbladder with extensive pericholecystic inflammation and marked intra and extrahepatic biliary ductal dilatation to the level of the pancreatic head. No aggressive or invasive features are seen. Findings are compatible with acute cholecystitis. 2. Extensive intra and extrahepatic biliary ductal dilatation with acute tapering at the level of the pancreatic head. No visible gallstones. Could consider right upper quadrant ultrasound or MRCP for further evaluation. 3. Urinary bladder appears circumferentially thickened with multiple bladder diverticula seen along the right lateral aspect of the bladder. Some hazy stranding is noted about these bladder diverticula. Findings are suggestive of cystitis. Correlate with urinalysis. 4. Indeterminate 1.4 cm nodule in the body of the left adrenal gland. If there is no history of malignancy, further characterization could be performed with adrenal mass protocol CT or MRI. This recommendation follows ACR consensus guidelines: Management of Incidental Adrenal Masses: A White Paper of the ACR Incidental Findings Committee. J Am Coll Radiol 2017;14:1038-1044. 5. Focal fusiform ectasia of the infrarenal abdominal aorta measuring up to 2.5 cm in diameter. Ectatic abdominal aorta at risk for aneurysm development. Recommend  followup by ultrasound in 5 years. This recommendation follows ACR consensus guidelines: White Paper of the ACR Incidental Findings Committee II on Vascular Findings. J Am Coll Radiol 2013; 10:789-794. Aortic aneurysm NOS (ICD10-I71.9) 6.  Aortic Atherosclerosis (ICD10-I70.0). These results were called by telephone at the time of interpretation on 12/29/2019 at 4:03 am to provider Dr Clayborne DanaMesner, who verbally acknowledged these results. Electronically Signed   By: Kreg ShropshirePrice  DeHay  M.D.   On: 12/29/2019 04:03    Procedures .Critical Care Performed by: Antony Madura, PA-C Authorized by: Antony Madura, PA-C   Critical care provider statement:    Critical care time (minutes):  45   Critical care was necessary to treat or prevent imminent or life-threatening deterioration of the following conditions:  Sepsis   Critical care was time spent personally by me on the following activities:  Discussions with consultants, evaluation of patient's response to treatment, examination of patient, ordering and performing treatments and interventions, ordering and review of laboratory studies, ordering and review of radiographic studies, pulse oximetry, re-evaluation of patient's condition, obtaining history from patient or surrogate and review of old charts   (including critical care time)  Medications Ordered in ED Medications  sodium chloride flush (NS) 0.9 % injection 3 mL (has no administration in time range)  metroNIDAZOLE (FLAGYL) IVPB 500 mg (500 mg Intravenous New Bag/Given 12/29/19 0409)  0.9 %  sodium chloride infusion ( Intravenous New Bag/Given 12/29/19 0237)  cefTRIAXone (ROCEPHIN) 1 g in sodium chloride 0.9 % 100 mL IVPB (0 g Intravenous Stopped 12/29/19 0346)  fentaNYL (SUBLIMAZE) injection 50 mcg (50 mcg Intravenous Given 12/29/19 0250)  iohexol (OMNIPAQUE) 300 MG/ML solution 100 mL (100 mLs Intravenous Contrast Given 12/29/19 2119)    ED Course  I have reviewed the triage vital signs and the nursing  notes.  Pertinent labs & imaging results that were available during my care of the patient were reviewed by me and considered in my medical decision making (see chart for details).  4:38 AM Spoke with Dr. Myrtie Neither of GI who has reviewed the patient's CT scan and laboratory evaluation. Does not feel that MRCP or ERCP is presently indicated; feels this would only serve to delay appropriate care. Rather favors surgical consultation for discussion urgent/emergent intervention given significance of gallbladder inflammatory changes.  4:55 AM Case discussed with Dr. Corliss Skains of general surgery. Plan for surgical consultation in the morning. Requests Plavix be held. Consult placed to [facility] for admission.  5:15 AM Dr. Loney Loh of [facility] to assess in the ED for admission.     MDM Rules/Calculators/A&P                      86 year old female presenting to the emergency department for evaluation of progressive abdominal pain over the past few days.  Initially began in her epigastrium and felt similar to indigestion.  States that pain has migrated more to her right upper quadrant and right lower abdomen.  She has history of prior appendectomy.  Generally tender on initial exam, worse so in the right upper and right lower quadrants with voluntary guarding.  Patient without peritonitis on physical exam while in the emergency department.  Her pain is improved with one dose of fentanyl.  Pain appears to correlate not only with acute cystitis, but also with a calculus cholecystitis.  She was started on Rocephin on arrival given urine findings.  Coverage was subsequently broadened with IV Flagyl.  Blood cultures added given leukocytosis and low-grade fever on arrival.  She will be admitted by the hospitalist service with general surgery consulting.  Patient agreeable to plan for admission.  Surgery requests the patient's Plavix be held.   Final Clinical Impression(s) / ED Diagnoses Final diagnoses:  Cystitis  Acute  cholecystitis without calculus  Sepsis, due to unspecified organism, unspecified whether acute organ dysfunction present [facility])    Rx / DC Orders ED Discharge Orders    None  Antony Madura, PA-C 12/29/19 0518    Marily Memos, MD 12/29/19 [phone]

## 2019-12-29 NOTE — ED Notes (Signed)
Report given to 6 N RN. All questions answered 

## 2019-12-30 ENCOUNTER — Encounter (HOSPITAL_COMMUNITY): Payer: Self-pay | Admitting: *Deleted

## 2019-12-30 ENCOUNTER — Inpatient Hospital Stay (HOSPITAL_COMMUNITY): Payer: Medicare Other

## 2019-12-30 HISTORY — PX: IR PATIENT EVAL TECH 0-60 MINS: IMG5564

## 2019-12-30 LAB — PROTIME-INR
INR: 1.4 — ABNORMAL HIGH (ref 0.8–1.2)
Prothrombin Time: 16.8 seconds — ABNORMAL HIGH (ref 11.4–15.2)

## 2019-12-30 LAB — COMPREHENSIVE METABOLIC PANEL
ALT: 16 U/L (ref 0–44)
AST: 20 U/L (ref 15–41)
Albumin: 2.6 g/dL — ABNORMAL LOW (ref 3.5–5.0)
Alkaline Phosphatase: 83 U/L (ref 38–126)
Anion gap: 11 (ref 5–15)
BUN: 19 mg/dL (ref 8–23)
CO2: 23 mmol/L (ref 22–32)
Calcium: 8 mg/dL — ABNORMAL LOW (ref 8.9–10.3)
Chloride: 104 mmol/L (ref 98–111)
Creatinine, Ser: 0.81 mg/dL (ref 0.44–1.00)
GFR calc Af Amer: >60 mL/min (ref >60)
GFR calc non Af Amer: >60 mL/min (ref >60)
Glucose, Bld: 110 mg/dL — ABNORMAL HIGH (ref 70–99)
Potassium: 3 mmol/L — ABNORMAL LOW (ref 3.5–5.1)
Sodium: 138 mmol/L (ref 135–145)
Total Bilirubin: 1 mg/dL (ref 0.3–1.2)
Total Protein: 6 g/dL — ABNORMAL LOW (ref 6.5–8.1)

## 2019-12-30 LAB — GLUCOSE, CAPILLARY
Glucose-Capillary: 113 mg/dL — ABNORMAL HIGH (ref 70–99)
Glucose-Capillary: 76 mg/dL (ref 70–99)
Glucose-Capillary: 80 mg/dL (ref 70–99)
Glucose-Capillary: 85 mg/dL (ref 70–99)
Glucose-Capillary: 95 mg/dL (ref 70–99)
Glucose-Capillary: 99 mg/dL (ref 70–99)

## 2019-12-30 LAB — BASIC METABOLIC PANEL
Anion gap: 11 (ref 5–15)
BUN: 19 mg/dL (ref 8–23)
CO2: 22 mmol/L (ref 22–32)
Calcium: 8.1 mg/dL — ABNORMAL LOW (ref 8.9–10.3)
Chloride: 106 mmol/L (ref 98–111)
Creatinine, Ser: 1.18 mg/dL — ABNORMAL HIGH (ref 0.44–1.00)
GFR calc Af Amer: 48 mL/min — ABNORMAL LOW (ref >60)
GFR calc non Af Amer: 42 mL/min — ABNORMAL LOW (ref >60)
Glucose, Bld: 101 mg/dL — ABNORMAL HIGH (ref 70–99)
Potassium: 3.4 mmol/L — ABNORMAL LOW (ref 3.5–5.1)
Sodium: 139 mmol/L (ref 135–145)

## 2019-12-30 LAB — CBC
HCT: 34.4 % — ABNORMAL LOW (ref 36.0–46.0)
Hemoglobin: 11.2 g/dL — ABNORMAL LOW (ref 12.0–15.0)
MCH: 29.7 pg (ref 26.0–34.0)
MCHC: 32.6 g/dL (ref 30.0–36.0)
MCV: 91.2 fL (ref 80.0–100.0)
Platelets: 212 10*3/uL (ref 150–400)
RBC: 3.77 MIL/uL — ABNORMAL LOW (ref 3.87–5.11)
RDW: 13.6 % (ref 11.5–15.5)
WBC: 21.5 10*3/uL — ABNORMAL HIGH (ref 4.0–10.5)
nRBC: 0 % (ref 0.0–0.2)

## 2019-12-30 MED ORDER — SODIUM CHLORIDE 0.9% FLUSH
5.0000 mL | Freq: Three times a day (TID) | INTRAVENOUS | Status: DC
  Administered 2019-12-30 – 2020-01-03 (×12): 5 mL

## 2019-12-30 MED ORDER — MIDAZOLAM HCL 2 MG/2ML IJ SOLN
INTRAMUSCULAR | Status: AC | PRN
  Administered 2019-12-30: 0.5 mg via INTRAVENOUS
  Administered 2019-12-30: 1 mg via INTRAVENOUS

## 2019-12-30 MED ORDER — GADOBUTROL 1 MMOL/ML IV SOLN
6.5000 mL | Freq: Once | INTRAVENOUS | Status: AC | PRN
  Administered 2019-12-30: 6.5 mL via INTRAVENOUS

## 2019-12-30 MED ORDER — MIDAZOLAM HCL 2 MG/2ML IJ SOLN
INTRAMUSCULAR | Status: AC
  Filled 2019-12-30: qty 2

## 2019-12-30 MED ORDER — FENTANYL CITRATE (PF) 100 MCG/2ML IJ SOLN
INTRAMUSCULAR | Status: AC
  Filled 2019-12-30: qty 2

## 2019-12-30 MED ORDER — POTASSIUM CHLORIDE CRYS ER 20 MEQ PO TBCR
40.0000 meq | EXTENDED_RELEASE_TABLET | Freq: Three times a day (TID) | ORAL | Status: AC
  Administered 2019-12-30 (×3): 40 meq via ORAL
  Filled 2019-12-30 (×3): qty 2

## 2019-12-30 MED ORDER — FENTANYL CITRATE (PF) 100 MCG/2ML IJ SOLN
INTRAMUSCULAR | Status: AC | PRN
  Administered 2019-12-30 (×3): 25 ug via INTRAVENOUS

## 2019-12-30 MED ORDER — LIDOCAINE HCL 1 % IJ SOLN
INTRAMUSCULAR | Status: AC
  Filled 2019-12-30: qty 20

## 2019-12-30 NOTE — Progress Notes (Signed)
PROGRESS NOTE    Cheryl Atkinson  TKW:[id number] DOB: 1931-08-31 DOA: 12/28/2019 PCP: Shelva Majestic, MD     Brief Narrative:  From Dr. Seth Bake Rathore's H&P: "Shaquisha Wynn Peeplesis a 86 y.o.femalewith medical history significant ofGERD, hiatal hernia, history of appendectomy, hypertension, hyperlipidemia, PAD,prediabetespresenting to the ED for evaluation of abdominal pain.Patient reports 2-day history of right lower quadrant abdominal pain and nausea."  CT scan showed an a calculus hydropic gallbladder with inflammation and biliary duct dilatation.  Elevated white count and lactic acid.  LFTs unremarkable.  She was started on Rocephin and Flagyl.  General surgery was consulted.   New events last 24 hours / Subjective: MRCP on 1/27 showing acute cholecystitis with probable calculus lodged in the neck of the gallbladder and dilatation of the common hepatic duct and common bile duct.  IR consulted for percutaneous drain.  She is having it later today.  She is hungry.  She is having pain in the right side of the abdomen.  No nausea or vomiting.  Assessment & Plan:   Principal Problem:   Acute cholecystitis  Appreciate general surgery  Drained today with IR  NPO  Zosyn 3.375 g every 8 hours  Morphine and Zofran as needed  Active Problems: Sepsis (WBC, RR and T)             Monitor CBC                Lactate now WNL             Blood cx NGTD                Atherosclerotic PVD with intermittent claudication (HCC)             Hold Plavix for now pending  procedure    UTI (urinary tract infection)             Abx changed to Zosyn             Cx pending    Adrenal nodule (HCC)  DVT prophylaxis: SCDs Code Status: DNR Family Communication: Self Disposition Plan: Home; coming from home; no current barriers to discharge   Consultants:   Gen Surg  IR    Antimicrobials:  Anti-infectives (From admission, onward)   Start     Dose/Rate Route Frequency Ordered Stop   12/29/19 1400  piperacillin-tazobactam (ZOSYN) IVPB 3.375 g     3.375 g 12.5 mL/hr over 240 Minutes Intravenous Every 8 hours 12/29/19 1136     12/29/19 1200  cefTRIAXone (ROCEPHIN) 2 g in sodium chloride 0.9 % 100 mL IVPB  Status:  Discontinued     2 g 200 mL/hr over 30 Minutes Intravenous Every 24 hours 12/29/19 0623 12/29/19 1136   12/29/19 1000  metroNIDAZOLE (FLAGYL) IVPB 500 mg  Status:  Discontinued     500 mg 100 mL/hr over 60 Minutes Intravenous Every 8 hours 12/29/19 0623 12/29/19 1136   12/29/19 0415  metroNIDAZOLE (FLAGYL) IVPB 500 mg     500 mg 100 mL/hr over 60 Minutes Intravenous  Once 12/29/19 0401 12/29/19 0510   12/29/19 0245  cefTRIAXone (ROCEPHIN) 1 g in sodium chloride 0.9 % 100 mL IVPB     1 g 200 mL/hr over 30 Minutes Intravenous  Once 12/29/19 0231 12/29/19 0346       Objective: Vitals:   12/29/19 1720 12/29/19 1854 12/29/19 2007 12/30/19 0438  BP: (!) 133/50  (!) 132/51 114/86  Pulse: 78  76 77  Resp:  18  18 18   Temp: 99.1 F (37.3 C)  (!) 100.5 F (38.1 C) 99 F (37.2 C)  TempSrc: Oral  Oral Oral  SpO2: (!) 88% 96% 100% 98%  Weight:      Height:        Intake/Output Summary (Last 24 hours) at 12/30/2019 1222 Last data filed at 12/30/2019 0900 Gross per 24 hour  Intake 634.06 ml  Output 1100 ml  Net -465.94 ml   Filed Weights   12/29/19 0656  Weight: 65.3 kg    Examination:  General exam: Appears calm and comfortable  Respiratory system: Clear to auscultation. Respiratory effort normal. No respiratory distress. No conversational dyspnea.  Cardiovascular system: S1 & S2 heard, RRR. No murmurs. No pedal edema. Gastrointestinal system: Abdomen is nondistended, soft and diffusely tender to palpation, worse on the right side. Normal bowel sounds heard. Central nervous system: Alert and oriented. No focal neurological deficits. Speech clear.  Extremities: Symmetric in appearance  Skin: No rashes, lesions or ulcers on exposed skin  Psychiatry:  Judgement and insight appear normal. Mood & affect appropriate.   Data Reviewed: I have personally reviewed following labs and imaging studies  CBC: Recent Labs  Lab 12/28/19 2330 12/30/19 0235  WBC 14.2* 21.5*  HGB 12.0 11.2*  HCT 37.3 34.4*  MCV 91.9 91.2  PLT 273 212   Basic Metabolic Panel: Recent Labs  Lab 12/28/19 2330 12/30/19 0235  NA 135 138  K 3.9 3.0*  CL 99 104  CO2 23 23  GLUCOSE 162* 110*  BUN 22 19  CREATININE 0.86 0.81  CALCIUM 8.9 8.0*   GFR: Estimated Creatinine Clearance: 46.7 mL/min (by C-G formula based on SCr of 0.81 mg/dL). Liver Function Tests: Recent Labs  Lab 12/28/19 2330 12/30/19 0235  AST 22 20  ALT 16 16  ALKPHOS 97 83  BILITOT 0.9 1.0  PROT 7.2 6.0*  ALBUMIN 3.7 2.6*   Recent Labs  Lab 12/28/19 2330  LIPASE 16   CBG: Recent Labs  Lab 12/29/19 2042 12/29/19 2324 12/30/19 0337 12/30/19 0735 12/30/19 1159  GLUCAP 104* 92 95 85 76   Sepsis Labs: Recent Labs  Lab 12/29/19 0243 12/29/19 0715  LATICACIDVEN 2.1* 1.6    Recent Results (from the past 240 hour(s))  Culture, blood (Routine X 2) w Reflex to ID Panel     Status: None (Preliminary result)   Collection Time: 12/29/19  4:27 AM   Specimen: BLOOD LEFT FOREARM  Result Value Ref Range Status   Specimen Description BLOOD LEFT FOREARM  Final   Special Requests   Final    BOTTLES DRAWN AEROBIC AND ANAEROBIC Blood Culture results may not be optimal due to an inadequate volume of blood received in culture bottles   Culture   Final    NO GROWTH 1 DAY Performed at [facility], [address] [place], [place] [address]    Report Status PENDING  Incomplete  Respiratory Panel by RT PCR (Flu A&B, Covid) - Nasopharyngeal Swab     Status: None   Collection Time: 12/29/19  5:04 AM   Specimen: Nasopharyngeal Swab  Result Value Ref Range Status   SARS Coronavirus 2 by RT PCR NEGATIVE NEGATIVE Final    Comment: (NOTE) SARS-CoV-2 target nucleic acids are NOT  DETECTED. The SARS-CoV-2 RNA is generally detectable in upper respiratoy specimens during the acute phase of infection. The lowest concentration of SARS-CoV-2 viral copies this assay can detect is 131 copies/mL. A negative result does not  preclude SARS-Cov-2 infection and should not be used as the sole basis for treatment or other patient management decisions. A negative result may occur with  improper specimen collection/handling, submission of specimen other than nasopharyngeal swab, presence of viral mutation(s) within the areas targeted by this assay, and inadequate number of viral copies (<131 copies/mL). A negative result must be combined with clinical observations, patient history, and epidemiological information. The expected result is Negative. Fact Sheet for Patients:  PinkCheek.be Fact Sheet for Healthcare Providers:  GravelBags.it This test is not yet ap proved or cleared by the [place] FDA and  has been authorized for detection and/or diagnosis of SARS-CoV-2 by FDA under an Emergency Use Authorization (EUA). This EUA will remain  in effect (meaning this test can be used) for the duration of the COVID-19 declaration under Section 564(b)(1) of the Act, 21 U.S.C. section 360bbb-3(b)(1), unless the authorization is terminated or revoked sooner.    Influenza A by PCR NEGATIVE NEGATIVE Final   Influenza B by PCR NEGATIVE NEGATIVE Final    Comment: (NOTE) The Xpert Xpress SARS-CoV-2/FLU/RSV assay is intended as an aid in  the diagnosis of influenza from Nasopharyngeal swab specimens and  should not be used as a sole basis for treatment. Nasal washings and  aspirates are unacceptable for Xpert Xpress SARS-CoV-2/FLU/RSV  testing. Fact Sheet for Patients: PinkCheek.be Fact Sheet for Healthcare Providers: GravelBags.it This test is not yet approved or cleared  by the [place] FDA and  has been authorized for detection and/or diagnosis of SARS-CoV-2 by  FDA under an Emergency Use Authorization (EUA). This EUA will remain  in effect (meaning this test can be used) for the duration of the  Covid-19 declaration under Section 564(b)(1) of the Act, 21  U.S.C. section 360bbb-3(b)(1), unless the authorization is  terminated or revoked. Performed at [facility], [place] [address], [place], [place] [address]   Culture, blood (Routine X 2) w Reflex to ID Panel     Status: None (Preliminary result)   Collection Time: 12/29/19  7:15 AM   Specimen: BLOOD  Result Value Ref Range Status   Specimen Description BLOOD LEFT ANTECUBITAL  Final   Special Requests   Final    BOTTLES DRAWN AEROBIC AND ANAEROBIC Blood Culture results may not be optimal due to an excessive volume of blood received in culture bottles   Culture   Final    NO GROWTH 1 DAY Performed at [facility], [place] [address] [place], [place] [address]    Report Status PENDING  Incomplete      Radiology Studies: CT ABDOMEN PELVIS W CONTRAST  Result Date: 12/29/2019 CLINICAL DATA:  Sharp constant right lower quadrant abdominal pain with nausea and constipation EXAM: CT ABDOMEN AND PELVIS WITH CONTRAST TECHNIQUE: Multidetector CT imaging of the abdomen and pelvis was performed using the standard protocol following bolus administration of intravenous contrast. CONTRAST:  146mL OMNIPAQUE IOHEXOL 300 MG/ML  SOLN COMPARISON:  Radiograph Apr 15, 2011, CT lumbar spine January 04, 2011 FINDINGS: Lower chest: Bandlike areas of atelectasis in the lung base with more dependent atelectasis in the posterior right lower lobe. Mild cardiomegaly with coronary artery atherosclerosis. No pericardial effusion. Hepatobiliary: No focal liver lesions are identified. Gallbladder is markedly hydropic mural thickening. There is extensive pericholecystic inflammation with mucosal hyperemia and marked intra and  extrahepatic biliary ductal dilatation to the level of the pancreatic head. Inflammation extends inferolaterally to the gallbladder fossa into the right pericolic gutter as well as  with stranding and phlegmon about the proximal duodenal sweep likely secondary to the gallbladder process. Pancreas: Pancreas is largely atrophic. No pancreatic ductal dilatation. Spleen: Normal in size without focal abnormality. Adrenals/Urinary Tract: 1.4 cm nodule in the body of the left adrenal gland is nonspecific. None no right adrenal nodules. 1 cm fluid attenuation cyst seen in the interpolar left kidney. No worrisome renal lesions. No hydronephrosis or urolithiasis. Urinary bladder appears circumferentially thickened with multiple bladder diverticula seen along the right lateral aspect of the bladder. Some hazy stranding is notable about these multiple bladder diverticula. Stomach/Bowel: Distal esophagus and stomach are unremarkable. Stranding adjacent the proximal duodenum is likely secondary to the gallbladder. No small bowel dilatation or wall thickening. Cecum is slightly displaced into the midline abdomen. The appendix is surgically absent. No colonic dilatation or wall thickening. Scattered colonic diverticula without focal pericolonic inflammation to suggest diverticulitis. Vascular/Lymphatic: Atherosclerotic plaque within the normal caliber aorta. Focal fusiform ectasia of the infrarenal abdominal aorta measuring up to 2.5 cm in diameter. Reactive adenopathy in the upper abdomen. No pathologically enlarged lymph nodes in the abdomen or pelvis. Reproductive: Uterus is surgically absent. No concerning adnexal lesions. Other: No abdominopelvic free fluid or free gas. Stranding and inflammation extends from the gallbladder to the right paracolic gutter. Additional hazy stranding is noted upon the multiple bladder diverticula in the pelvis. No bowel containing hernias. Musculoskeletal: The osseous structures appear diffusely  demineralized which may limit detection of small or nondisplaced fractures. Multilevel degenerative changes are present in the imaged portions of the spine. 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 No acute osseous abnormality or suspicious osseous lesion. IMPRESSION: 1. Markedly hydropic gallbladder with extensive pericholecystic inflammation and marked intra and extrahepatic biliary ductal dilatation to the level of the pancreatic head. No aggressive or invasive features are seen. Findings are compatible with acute cholecystitis. 2. Extensive intra and extrahepatic biliary ductal dilatation with acute tapering at the level of the pancreatic head. No visible gallstones. Could consider right upper quadrant ultrasound or MRCP for further evaluation. 3. Urinary bladder appears circumferentially thickened with multiple bladder diverticula seen along the right lateral aspect of the bladder. Some hazy stranding is noted about these bladder diverticula. Findings are suggestive of cystitis. Correlate with urinalysis. 4. Indeterminate 1.4 cm nodule in the body of the left adrenal gland. If there is no history of malignancy, further characterization could be performed with adrenal mass protocol CT or MRI. This recommendation follows ACR consensus guidelines: Management of Incidental Adrenal Masses: A White Paper of the ACR Incidental Findings Committee. J Am Coll Radiol 2017;14:1038-1044. 5. Focal fusiform ectasia of the infrarenal abdominal aorta measuring up to 2.5 cm in diameter. Ectatic abdominal aorta at risk for aneurysm development. Recommend followup by ultrasound in 5 years. This recommendation follows ACR consensus guidelines: White Paper of the ACR Incidental Findings Committee II on Vascular Findings. J Am Coll Radiol 2013; 10:789-794. Aortic aneurysm NOS (ICD10-I71.9) 6.  Aortic Atherosclerosis (ICD10-I70.0). These results were called by telephone at the  time of interpretation on 12/29/2019 at 4:03 am to provider Dr Clayborne DanaMesner, who verbally acknowledged these results. Electronically Signed   By: Kreg ShropshirePrice  DeHay M.D.   On: 12/29/2019 04:03   MR 3D Recon At Scanner  Result Date: 12/30/2019 CLINICAL DATA:  RIGHT upper quadrant pain. Cholecystitis suspected on CT 1 day prior. EXAM: MRI ABDOMEN WITHOUT AND WITH CONTRAST (INCLUDING MRCP) TECHNIQUE: Multiplanar multisequence MR imaging of the abdomen was performed both before and after the  administration of intravenous contrast. Heavily T2-weighted images of the biliary and pancreatic ducts were obtained, and three-dimensional MRCP images were rendered by post processing. CONTRAST:  6.53mL GADAVIST GADOBUTROL 1 MMOL/ML IV SOLN COMPARISON:  CT 12/29/2019 FINDINGS: Lower chest: Small bilateral pleural effusions. Hepatobiliary: The gallbladder is markedly distended to 5.7 cm. There is extensive gallbladder wall thickening and pericholecystic fluid. Multiple gallstones layer dependently within the fundus of the gallbladder. Within the neck of the bladder, there is an obstructing calculus measuring approximately 5 mm on image 16/7. This potential calculus is also seen on image 33/13 of the MRCP series. While the common bile duct is dilated to 10 mm, there is no discrete filling defect within the common bile duct. The ductal dilatation extends the level of the ampulla. There is mild intrahepatic duct dilatation involving the LEFT hepatic lobe. No focal hepatic lesion. Postcontrast imaging is severely degraded by patient respiratory motion. Pancreas: Pancreatic duct is normal caliber. No pancreatic inflammation identified Spleen: Normal spleen Adrenals/urinary tract: Adrenal glands and kidneys are normal. The ureters and bladder normal. Stomach/Bowel: Stomach, small bowel, appendix, and cecum are normal. The colon and rectosigmoid colon are normal. Vascular/Lymphatic: Abdominal aorta is normal caliber. No periportal or retroperitoneal  adenopathy. No pelvic adenopathy. Reproductive: Other: No free fluid. Musculoskeletal: No aggressive osseous lesion. IMPRESSION: 1. Acute cholecystitis. Probable calculus lodged within the neck of the gallbladder. 2. Dilatation of the common hepatic duct and common bile duct without obstructing lesion identified. 3. Minimal intrahepatic duct dilatation. 4. No pancreatic duct dilatation.  No evidence of pancreatitis. Electronically Signed   By: Genevive Bi M.D.   On: 12/30/2019 07:53   MR ABDOMEN MRCP W WO CONTAST  Result Date: 12/30/2019 CLINICAL DATA:  RIGHT upper quadrant pain. Cholecystitis suspected on CT 1 day prior. EXAM: MRI ABDOMEN WITHOUT AND WITH CONTRAST (INCLUDING MRCP) TECHNIQUE: Multiplanar multisequence MR imaging of the abdomen was performed both before and after the administration of intravenous contrast. Heavily T2-weighted images of the biliary and pancreatic ducts were obtained, and three-dimensional MRCP images were rendered by post processing. CONTRAST:  6.26mL GADAVIST GADOBUTROL 1 MMOL/ML IV SOLN COMPARISON:  CT 12/29/2019 FINDINGS: Lower chest: Small bilateral pleural effusions. Hepatobiliary: The gallbladder is markedly distended to 5.7 cm. There is extensive gallbladder wall thickening and pericholecystic fluid. Multiple gallstones layer dependently within the fundus of the gallbladder. Within the neck of the bladder, there is an obstructing calculus measuring approximately 5 mm on image 16/7. This potential calculus is also seen on image 33/13 of the MRCP series. While the common bile duct is dilated to 10 mm, there is no discrete filling defect within the common bile duct. The ductal dilatation extends the level of the ampulla. There is mild intrahepatic duct dilatation involving the LEFT hepatic lobe. No focal hepatic lesion. Postcontrast imaging is severely degraded by patient respiratory motion. Pancreas: Pancreatic duct is normal caliber. No pancreatic inflammation identified  Spleen: Normal spleen Adrenals/urinary tract: Adrenal glands and kidneys are normal. The ureters and bladder normal. Stomach/Bowel: Stomach, small bowel, appendix, and cecum are normal. The colon and rectosigmoid colon are normal. Vascular/Lymphatic: Abdominal aorta is normal caliber. No periportal or retroperitoneal adenopathy. No pelvic adenopathy. Reproductive: Other: No free fluid. Musculoskeletal: No aggressive osseous lesion. IMPRESSION: 1. Acute cholecystitis. Probable calculus lodged within the neck of the gallbladder. 2. Dilatation of the common hepatic duct and common bile duct without obstructing lesion identified. 3. Minimal intrahepatic duct dilatation. 4. No pancreatic duct dilatation.  No evidence of pancreatitis. Electronically Signed  By: Genevive Bi M.D.   On: 12/30/2019 07:53      Scheduled Meds: . insulin aspart  0-6 Units Subcutaneous Q4H  . potassium chloride  40 mEq Oral TID  . Rotigotine  3 mg Transdermal QHS   Continuous Infusions: . piperacillin-tazobactam (ZOSYN)  IV 3.375 g (12/30/19 0518)     LOS: 1 day    Time spent: 20 minutes   Sharlene Dory, DO [facility] 12/30/2019, 12:22 PM   Available via Epic secure chat 7am-7pm After these hours, please refer to coverage provider listed on [email or web address]

## 2019-12-30 NOTE — Plan of Care (Signed)
  Problem: Education: Goal: Knowledge of General Education information will improve Description Including pain rating scale, medication(s)/side effects and non-pharmacologic comfort measures Outcome: Progressing   

## 2019-12-30 NOTE — Procedures (Signed)
Interventional Radiology Procedure:   Indications: Acute cholecystitis with gallstones  Procedure: CT guided cholecystostomy tube placement  Findings: Distended gallbladder.  10 Fr drain placed and 130 ml yellow bilious fluid removed.    Complications: None     EBL: less than 10 ml  Plan: Send fluid for culture. Follow output.     Less Woolsey R. Lowella Dandy, MD  Pager: [phone]

## 2019-12-30 NOTE — Progress Notes (Signed)
CC: Abdominal pain  Subjective: Patient still tender in the right upper quadrant.  No other significant complaints this AM.  Objective: Vital signs in last 24 hours: Temp:  [99 F (37.2 C)-100.5 F (38.1 C)] 99 F (37.2 C) (01/28 0438) Pulse Rate:  [76-78] 77 (01/28 0438) Resp:  [18] 18 (01/28 0438) BP: (114-133)/(50-86) 114/86 (01/28 0438) SpO2:  [88 %-100 %] 98 % (01/28 0438) Last BM Date: 12/30/19 N.p.o. 634 IV recorded 450 urine recorded No other output or intake recorded T-max 100.5, vital signs are stable Labs: Potassium is 3.0, total bilirubin 1.0, ALT 16, AST 20 WBC 21.5 MRI shows acute cholecystitis, probable calculus lodged within the neck of the gallbladder, dilatation of the common hepatic duct and common bile duct without obstructing lesion identified.  Minimal intrahepatic ductal  dilatation, no pancreatic duct dilatation, no evidence of pancreatitis. Intake/Output from previous day: 01/27 0701 - 01/28 0700 In: 634.1 [I.V.:551.9; IV Piggyback:82.2] Out: 450 [Urine:450] Intake/Output this shift: Total I/O In: -  Out: 800 [Urine:800]  General appearance: alert, cooperative and no distress Resp: clear to auscultation bilaterally Cardio: Regular rate and rhythm GI: Soft, mildly distended tender in the right upper quadrant.  Positive bowel sounds.  She has a wick in place. Extremities: extremities normal, atraumatic, no cyanosis or edema  Lab Results:  Recent Labs    12/28/19 2330 12/30/19 0235  WBC 14.2* 21.5*  HGB 12.0 11.2*  HCT 37.3 34.4*  PLT 273 212    BMET Recent Labs    12/28/19 2330 12/30/19 0235  NA 135 138  K 3.9 3.0*  CL 99 104  CO2 23 23  GLUCOSE 162* 110*  BUN 22 19  CREATININE 0.86 0.81  CALCIUM 8.9 8.0*   PT/INR No results for input(s): LABPROT, INR in the last 72 hours.  Recent Labs  Lab 12/28/19 2330 12/30/19 0235  AST 22 20  ALT 16 16  ALKPHOS 97 83  BILITOT 0.9 1.0  PROT 7.2 6.0*  ALBUMIN 3.7 2.6*      Lipase     Component Value Date/Time   LIPASE 16 12/28/2019 2330     Medications: . insulin aspart  0-6 Units Subcutaneous Q4H  . potassium chloride  40 mEq Oral TID  . Rotigotine  3 mg Transdermal QHS    Assessment/Plan GERD HTN HLD PVD on plavix - followed by Dr. Allyson Sabal, hold plavix COPD Pre-DM - on metformin. A1c 6.1 (10/2019) Hypothyroidism RLS Code status DNR  Recurrent UTIs UTI - Ucx pending, per primary  Abdominal pain/nausea Acute cholecystitis  - Patient with onset of upper abdominal pain yesterday morning associated with nausea. CT scan shows acalculous hydropic gallbladder with extensive pericholecystic inflammation and marked intra and extrahepatic biliary ductal dilatation to the level of the pancreatic head. LFTs are WNL. Will first obtain MRCP for further evaluation of ductal dilatation and to rule out a choledocholithiasis. If this is negative, plan for HIDA to confirm cholecystitis. If cystic duct is obstructed we discussed laparoscopic cholecystectomy versus percutaneous cholecystostomy tube placement. With her age and multiple co-morbidities patient would be high risk with general anesthesia and surgical intervention. If needed, we would recommend a perc chole tube. Recheck labs in AM. Switch antibiotics to zosyn.  MRCP: Acute cholecystitis probable callus lodged within the neck of the gallbladder, dilatation of the common hepatic duct and common bile duct without obstructing lesion identified.  Minimal intrahepatic duct dilatation, no pancreatic duct dilatation no evidence of pancreatitis.  ID - rocephin/flagyl 1/27>>1/27. Zosyn  1/27>> day 2 VTE - SCDs, ok for chemical DVT prophylaxis from surgical standpoint FEN - IVF, NPO Foley - none Follow up - TBD  Plan: Dr. Grandville Silos has reviewed the MRI and his recommendation that we go forward with IR cholecystostomy drain placement.  This was discussed with the patient and explained.  She is in agreement with our  plan.      LOS: 1 day    Lenny Bouchillon 12/30/2019 Please see Amion

## 2019-12-30 NOTE — Consult Note (Signed)
Chief Complaint: Patient was seen in consultation today for percutaneous cholecystostomy Chief Complaint  Patient presents with  . Abdominal Pain     Referring Physician(s): Thompson,B  Supervising Physician: Richarda OverlieHenn, Adam  [facility]  History of Present Illness: Cheryl Atkinson is an 86 y.o. female with past medical history of anemia, chronic low back pain, GERD, hypertension, osteoporosis, COPD, prediabetes, recurrent UTIs , peripheral vascular disease on Plavix, hiatal hernia, prior appendectomy/hysterectomy who was [facility] with worsening abdominal pain, primarily right upper quadrant-associated nausea/mild temperature elevation, some dyspnea with exertion.  Subsequent imaging has revealed acute cholecystitis with probable calculus lodged within the neck of the gallbladder, dilatation of the common hepatic duct and common bile duct without obstructing lesion, minimal intrahepatic duct dilatation, no pancreatic duct dilatation or pancreatitis.  Patient is poor surgical candidate and request now received from surgery for percutaneous cholecystostomy.  Current temp 99, WBC 21, LFT's okay, COVID-19 negative, blood culture negative to date.  Patient reports last dose of Plavix was over a week ago.  Past Medical History:  Diagnosis Date  . Anemia   . Arthritis    "shoulders" (05/04/2015)  . Cellulitis of right lower extremity 11/27/2013  . Chronic lower back pain   . Constipation   . GERD (gastroesophageal reflux disease)   . History of hiatal hernia   . Hypertension   . Osteoporosis   . Peripheral arterial disease (HCC)   . Restless leg syndrome   . Thyroid disease     Past Surgical History:  Procedure Laterality Date  . ABDOMINAL AORTAGRAM  05/04/2015   Procedure: Abdominal Aortagram;  Surgeon: Runell GessJonathan J Berry, MD;  [facility];  Service: Cardiovascular;;  . APPENDECTOMY    . CATARACT EXTRACTION W/ INTRAOCULAR  LENS  IMPLANT, BILATERAL Bilateral   . I & D EXTREMITY Right 12/22/2016   Procedure: IRRIGATION AND DEBRIDEMENT EXTREMITY;  Surgeon: Mack Hookavid Thompson, MD;  [facility];  Service: Orthopedics;  Laterality: Right;  . PERIPHERAL VASCULAR CATHETERIZATION N/A 05/04/2015   Procedure: Lower Extremity Angiography;  Surgeon: Runell GessJonathan J Berry, MD;  [facility];  Service: Cardiovascular;  Laterality: N/A;  . PERIPHERAL VASCULAR CATHETERIZATION  05/04/2015   Procedure: Peripheral Vascular Intervention;  Surgeon: Runell GessJonathan J Berry, MD;  [facility];  Service: Cardiovascular;;  RCIA - [id number] ICAST  . PERIPHERAL VASCULAR CATHETERIZATION Right 09/04/2015   Procedure: Peripheral Vascular Atherectomy;  Surgeon: Runell GessJonathan J Berry, MD;  [facility];  Service: Cardiovascular;  Laterality: Right;  SFA  . sfa Right 09/04/2015   de balloon  . THYROID SURGERY Right ?2013   "had goiter taken off my neck"  . TONSILLECTOMY    . VAGINAL HYSTERECTOMY      Allergies: Codeine  Medications: Prior to Admission medications   Medication Sig Start Date End Date Taking? Authorizing Provider  Albuterol Sulfate (PROAIR RESPICLICK) 108 (90 Base) MCG/ACT AEPB Inhale 2 puffs into the lungs every 6 (six) hours as needed (shortness of breath from COPD). 12/29/18  Yes Shelva MajesticHunter, Stephen O, MD  aspirin EC 81 MG EC tablet Take 1 tablet (81 mg total) by mouth daily. 05/05/15  Yes Kilroy, Luke K, PA-C  atorvastatin (LIPITOR) 40 MG tablet Take 1 tablet (40 mg total) by mouth daily. 10/07/19  Yes Shelva MajesticHunter, Stephen O, MD  clopidogrel (PLAVIX) 75 MG tablet Take 1 tablet (75 mg total) by mouth daily with breakfast. MUST KEEP APPOINTMENT  07/20/19 WITH DR Allyson Sabal FOR FUTURE REFILLS 06/11/19  Yes Runell Gess, MD  metFORMIN (GLUCOPHAGE) 500 MG tablet TAKE 1 (1) TABLET BY MOUTH DAILY Patient taking differently: Take 500 mg by mouth daily with breakfast.  10/05/19  Yes Shelva Majestic, MD  Multiple  Vitamins-Minerals (PRESERVISION AREDS 2 PO) Take 1 tablet by mouth 2 (two) times daily.   Yes [provider]  Olmesartan-amLODIPine-HCTZ (TRIBENZOR) 40-10-12.5 MG TABS Take 1 tablet by mouth every morning. Patient taking differently: Take 1 tablet by mouth daily.  10/05/19  Yes Shelva Majestic, MD  polyethylene glycol [facility] Ethelene Hal) packet Take 17 g by mouth at bedtime as needed for mild constipation. Reported on 03/14/2016   Yes [provider]  Rotigotine 3 MG/24HR PT24 Place 3 mg onto the skin at bedtime. 03/31/19  Yes Shelva Majestic, MD  atorvastatin (LIPITOR) 40 MG tablet Take 1 tablet (40 mg total) by mouth daily at 6 PM. Patient not taking: Reported on 12/29/2019 10/05/19   Shelva Majestic, MD  ciprofloxacin (CIPRO) 250 MG tablet Take 1 tablet (250 mg total) by mouth every 12 (twelve) hours. Patient not taking: Reported on 12/29/2019 08/20/19   Eustace Moore, MD     Family History  Problem Relation Age of Onset  . Heart disease Father   . Heart attack Father   . Heart disease Mother   . Coronary artery disease Other   . Atrial fibrillation Sister   . Stroke Maternal Grandmother   . Cancer Paternal Grandmother     Social History   Socioeconomic History  . Marital status: Widowed    Spouse name: Not on file  . Number of children: Not on file  . Years of education: Not on file  . Highest education level: Not on file  Occupational History  . Not on file  Tobacco Use  . Smoking status: Former Smoker    Packs/day: 0.50    Years: 60.00    Pack years: 30.00    Types: Cigarettes    Quit date: 04/02/2015    Years since quitting: 4.7  . Smokeless tobacco: Never Used  Substance and Sexual Activity  . Alcohol use: No  . Drug use: No  . Sexual activity: Not Currently  Other Topics Concern  . Not on file  Social History Narrative   Widowed 2013. 2 sons. 1 grandchild.    Son can help on weekends. Sister and sister in law could help as well. Thinks  she may stop driving in next few years.       Retired from [employer].       Hobbies: time at home and with family      HCPOA: sister-in-law and brother. Deirdre Evener.       DNR/DNI      Regular exercise: none   Caffeine use: cup of coffee    Social Determinants of Health   Financial Resource Strain:   . Difficulty of Paying Living Expenses: Not on file  Food Insecurity:   . Worried About Programme researcher, broadcasting/film/video in the Last Year: Not on file  . Ran Out of Food in the Last Year: Not on file  Transportation Needs:   . Lack of Transportation (Medical): Not on file  . Lack of Transportation (Non-Medical): Not on file  Physical Activity:   . Days of Exercise per Week: Not on file  . Minutes of Exercise per Session: Not on file  Stress:   . Feeling of  Stress : Not on file  Social Connections:   . Frequency of Communication with Friends and Family: Not on file  . Frequency of Social Gatherings with Friends and Family: Not on file  . Attends Religious Services: Not on file  . Active Member of Clubs or Organizations: Not on file  . Attends BankerClub or Organization Meetings: Not on file  . Marital Status: Not on file      Review of Systems see above, currently denies fever, headache, chest pain, cough, nausea, vomiting or bleeding.  Vital Signs: BP 114/86 (BP Location: Left Arm)   Pulse 77   Temp 99 F (37.2 C) (Oral)   Resp 18   Ht 5\' 6"  (1.676 m)   Wt 143 lb 15.4 oz (65.3 kg)   LMP  (LMP Unknown)   SpO2 98%   BMI 23.24 kg/m   Physical Exam awake, alert.  Chest with slightly diminished breath sounds bases.  Heart with nl rate,  irregular rhythm.  Abdomen soft, moderately tender right upper quadrant/epigastric region to palpation.  Positive bowel sounds.  No significant lower extremity edema.  Imaging: CT ABDOMEN PELVIS W CONTRAST  Result Date: 12/29/2019 CLINICAL DATA:  Sharp constant right lower quadrant abdominal pain with nausea and constipation EXAM: CT ABDOMEN AND  PELVIS WITH CONTRAST TECHNIQUE: Multidetector CT imaging of the abdomen and pelvis was performed using the standard protocol following bolus administration of intravenous contrast. CONTRAST:  100mL OMNIPAQUE IOHEXOL 300 MG/ML  SOLN COMPARISON:  Radiograph Apr 15, 2011, CT lumbar spine January 04, 2011 FINDINGS: Lower chest: Bandlike areas of atelectasis in the lung base with more dependent atelectasis in the posterior right lower lobe. Mild cardiomegaly with coronary artery atherosclerosis. No pericardial effusion. Hepatobiliary: No focal liver lesions are identified. Gallbladder is markedly hydropic mural thickening. There is extensive pericholecystic inflammation with mucosal hyperemia and marked intra and extrahepatic biliary ductal dilatation to the level of the pancreatic head. Inflammation extends inferolaterally to the gallbladder fossa into the right pericolic gutter as well as with stranding and phlegmon about the proximal duodenal sweep likely secondary to the gallbladder process. Pancreas: Pancreas is largely atrophic. No pancreatic ductal dilatation. Spleen: Normal in size without focal abnormality. Adrenals/Urinary Tract: 1.4 cm nodule in the body of the left adrenal gland is nonspecific. None no right adrenal nodules. 1 cm fluid attenuation cyst seen in the interpolar left kidney. No worrisome renal lesions. No hydronephrosis or urolithiasis. Urinary bladder appears circumferentially thickened with multiple bladder diverticula seen along the right lateral aspect of the bladder. Some hazy stranding is notable about these multiple bladder diverticula. Stomach/Bowel: Distal esophagus and stomach are unremarkable. Stranding adjacent the proximal duodenum is likely secondary to the gallbladder. No small bowel dilatation or wall thickening. Cecum is slightly displaced into the midline abdomen. The appendix is surgically absent. No colonic dilatation or wall thickening. Scattered colonic diverticula without  focal pericolonic inflammation to suggest diverticulitis. Vascular/Lymphatic: Atherosclerotic plaque within the normal caliber aorta. Focal fusiform ectasia of the infrarenal abdominal aorta measuring up to 2.5 cm in diameter. Reactive adenopathy in the upper abdomen. No pathologically enlarged lymph nodes in the abdomen or pelvis. Reproductive: Uterus is surgically absent. No concerning adnexal lesions. Other: No abdominopelvic free fluid or free gas. Stranding and inflammation extends from the gallbladder to the right paracolic gutter. Additional hazy stranding is noted upon the multiple bladder diverticula in the pelvis. No bowel containing hernias. Musculoskeletal: The osseous structures appear diffusely demineralized which may limit detection of small or nondisplaced fractures.  Multilevel degenerative changes are present in the imaged portions of the spine. 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 No acute osseous abnormality or suspicious osseous lesion. IMPRESSION: 1. Markedly hydropic gallbladder with extensive pericholecystic inflammation and marked intra and extrahepatic biliary ductal dilatation to the level of the pancreatic head. No aggressive or invasive features are seen. Findings are compatible with acute cholecystitis. 2. Extensive intra and extrahepatic biliary ductal dilatation with acute tapering at the level of the pancreatic head. No visible gallstones. Could consider right upper quadrant ultrasound or MRCP for further evaluation. 3. Urinary bladder appears circumferentially thickened with multiple bladder diverticula seen along the right lateral aspect of the bladder. Some hazy stranding is noted about these bladder diverticula. Findings are suggestive of cystitis. Correlate with urinalysis. 4. Indeterminate 1.4 cm nodule in the body of the left adrenal gland. If there is no history of malignancy, further characterization could be  performed with adrenal mass protocol CT or MRI. This recommendation follows ACR consensus guidelines: Management of Incidental Adrenal Masses: A White Paper of the ACR Incidental Findings Committee. J Am Coll Radiol 2017;14:1038-1044. 5. Focal fusiform ectasia of the infrarenal abdominal aorta measuring up to 2.5 cm in diameter. Ectatic abdominal aorta at risk for aneurysm development. Recommend followup by ultrasound in 5 years. This recommendation follows ACR consensus guidelines: White Paper of the ACR Incidental Findings Committee II on Vascular Findings. J Am Coll Radiol 2013; 10:789-794. Aortic aneurysm NOS (ICD10-I71.9) 6.  Aortic Atherosclerosis (ICD10-I70.0). These results were called by telephone at the time of interpretation on 12/29/2019 at 4:03 am to provider Dr Clayborne Dana, who verbally acknowledged these results. Electronically Signed   By: Kreg Shropshire M.D.   On: 12/29/2019 04:03   MR 3D Recon At Scanner  Result Date: 12/30/2019 CLINICAL DATA:  RIGHT upper quadrant pain. Cholecystitis suspected on CT 1 day prior. EXAM: MRI ABDOMEN WITHOUT AND WITH CONTRAST (INCLUDING MRCP) TECHNIQUE: Multiplanar multisequence MR imaging of the abdomen was performed both before and after the administration of intravenous contrast. Heavily T2-weighted images of the biliary and pancreatic ducts were obtained, and three-dimensional MRCP images were rendered by post processing. CONTRAST:  6.21mL GADAVIST GADOBUTROL 1 MMOL/ML IV SOLN COMPARISON:  CT 12/29/2019 FINDINGS: Lower chest: Small bilateral pleural effusions. Hepatobiliary: The gallbladder is markedly distended to 5.7 cm. There is extensive gallbladder wall thickening and pericholecystic fluid. Multiple gallstones layer dependently within the fundus of the gallbladder. Within the neck of the bladder, there is an obstructing calculus measuring approximately 5 mm on image 16/7. This potential calculus is also seen on image 33/13 of the MRCP series. While the common bile  duct is dilated to 10 mm, there is no discrete filling defect within the common bile duct. The ductal dilatation extends the level of the ampulla. There is mild intrahepatic duct dilatation involving the LEFT hepatic lobe. No focal hepatic lesion. Postcontrast imaging is severely degraded by patient respiratory motion. Pancreas: Pancreatic duct is normal caliber. No pancreatic inflammation identified Spleen: Normal spleen Adrenals/urinary tract: Adrenal glands and kidneys are normal. The ureters and bladder normal. Stomach/Bowel: Stomach, small bowel, appendix, and cecum are normal. The colon and rectosigmoid colon are normal. Vascular/Lymphatic: Abdominal aorta is normal caliber. No periportal or retroperitoneal adenopathy. No pelvic adenopathy. Reproductive: Other: No free fluid. Musculoskeletal: No aggressive osseous lesion. IMPRESSION: 1. Acute cholecystitis. Probable calculus lodged within the neck of the gallbladder. 2. Dilatation of the common hepatic duct and common bile duct without obstructing lesion identified. 3. Minimal  intrahepatic duct dilatation. 4. No pancreatic duct dilatation.  No evidence of pancreatitis. Electronically Signed   By: Genevive Bi M.D.   On: 12/30/2019 07:53   MR ABDOMEN MRCP W WO CONTAST  Result Date: 12/30/2019 CLINICAL DATA:  RIGHT upper quadrant pain. Cholecystitis suspected on CT 1 day prior. EXAM: MRI ABDOMEN WITHOUT AND WITH CONTRAST (INCLUDING MRCP) TECHNIQUE: Multiplanar multisequence MR imaging of the abdomen was performed both before and after the administration of intravenous contrast. Heavily T2-weighted images of the biliary and pancreatic ducts were obtained, and three-dimensional MRCP images were rendered by post processing. CONTRAST:  6.88mL GADAVIST GADOBUTROL 1 MMOL/ML IV SOLN COMPARISON:  CT 12/29/2019 FINDINGS: Lower chest: Small bilateral pleural effusions. Hepatobiliary: The gallbladder is markedly distended to 5.7 cm. There is extensive gallbladder  wall thickening and pericholecystic fluid. Multiple gallstones layer dependently within the fundus of the gallbladder. Within the neck of the bladder, there is an obstructing calculus measuring approximately 5 mm on image 16/7. This potential calculus is also seen on image 33/13 of the MRCP series. While the common bile duct is dilated to 10 mm, there is no discrete filling defect within the common bile duct. The ductal dilatation extends the level of the ampulla. There is mild intrahepatic duct dilatation involving the LEFT hepatic lobe. No focal hepatic lesion. Postcontrast imaging is severely degraded by patient respiratory motion. Pancreas: Pancreatic duct is normal caliber. No pancreatic inflammation identified Spleen: Normal spleen Adrenals/urinary tract: Adrenal glands and kidneys are normal. The ureters and bladder normal. Stomach/Bowel: Stomach, small bowel, appendix, and cecum are normal. The colon and rectosigmoid colon are normal. Vascular/Lymphatic: Abdominal aorta is normal caliber. No periportal or retroperitoneal adenopathy. No pelvic adenopathy. Reproductive: Other: No free fluid. Musculoskeletal: No aggressive osseous lesion. IMPRESSION: 1. Acute cholecystitis. Probable calculus lodged within the neck of the gallbladder. 2. Dilatation of the common hepatic duct and common bile duct without obstructing lesion identified. 3. Minimal intrahepatic duct dilatation. 4. No pancreatic duct dilatation.  No evidence of pancreatitis. Electronically Signed   By: Genevive Bi M.D.   On: 12/30/2019 07:53    Labs:  CBC: Recent Labs    01/08/19 0853 10/05/19 1159 12/28/19 2330 12/30/19 0235  WBC 7.3 8.6 14.2* 21.5*  HGB 11.3* 11.7* 12.0 11.2*  HCT 34.9* 35.7* 37.3 34.4*  PLT 270.0 261.0 273 212    COAGS: No results for input(s): INR, APTT in the last 8760 hours.  BMP: Recent Labs    01/08/19 0853 10/05/19 1159 12/28/19 2330 12/30/19 0235  NA 141 140 135 138  K 4.4 4.2 3.9 3.0*  CL  103 100 99 104  CO2 29 28 23 23   GLUCOSE 96 82 162* 110*  BUN 26* 24* 22 19  CALCIUM 9.3 9.5 8.9 8.0*  CREATININE 0.73 0.75 0.86 0.81  GFRNONAA  --   --  >60 >60  GFRAA  --   --  >60 >60    LIVER FUNCTION TESTS: Recent Labs    10/05/19 1159 12/28/19 2330 12/30/19 0235  BILITOT 0.5 0.9 1.0  AST 17 22 20   ALT 11 16 16   ALKPHOS 109 97 83  PROT 7.3 7.2 6.0*  ALBUMIN 4.3 3.7 2.6*    TUMOR MARKERS: No results for input(s): AFPTM, CEA, CA199, CHROMGRNA in the last 8760 hours.  Assessment and Plan: 86 y.o. female with past medical history of anemia, chronic low back pain, GERD, hypertension, osteoporosis, COPD, prediabetes, recurrent UTIs , peripheral vascular disease on Plavix, hiatal hernia, prior appendectomy/hysterectomy who  was admitted to [facility] on 1/26 with worsening abdominal pain, primarily right upper quadrant-associated nausea/mild temperature elevation, some dyspnea with exertion.  Subsequent imaging has revealed acute cholecystitis with probable calculus lodged within the neck of the gallbladder, dilatation of the common hepatic duct and common bile duct without obstructing lesion, minimal intrahepatic duct dilatation, no pancreatic duct dilatation or pancreatitis.  Patient is poor surgical candidate and request now received from surgery for percutaneous cholecystostomy.  Current temp 99, WBC 21, LFT's okay, COVID-19 negative, blood culture negative to date.  Patient reports last dose of Plavix was over a week ago.  Imaging studies were reviewed by Dr. Earleen Newport.  Details/risks of procedure, including but not limited to, internal bleeding, infection, injury to adjacent structures discussed with patient/son with their understanding and consent.  Procedure tent scheduled for later today.   Thank you for this interesting consult.  I greatly enjoyed meeting Cheryl Atkinson and look forward to participating in their care.  A copy of this report was sent to the requesting  provider on this date.  Electronically Signed: D. Rowe Robert, PA-C 12/30/2019, 11:40 AM   I spent a total of  30 minutes   in face to face in clinical consultation, greater than 50% of which was counseling/coordinating care for gallbladder drain

## 2019-12-30 NOTE — Plan of Care (Signed)
  Problem: Education: Goal: Knowledge of General Education information will improve Description: Including pain rating scale, medication(s)/side effects and non-pharmacologic comfort measures Outcome: Progressing   Problem: Health Behavior/Discharge Planning: Goal: Ability to manage health-related needs will improve Outcome: Progressing   Problem: Clinical Measurements: Goal: Ability to maintain clinical measurements within normal limits will improve Outcome: Progressing Goal: Respiratory complications will improve Outcome: Progressing   Problem: Elimination: Goal: Will not experience complications related to bowel motility Outcome: Progressing Goal: Will not experience complications related to urinary retention Outcome: Progressing   Problem: Pain Managment: Goal: General experience of comfort will improve Outcome: Progressing   Problem: Safety: Goal: Ability to remain free from injury will improve Outcome: Progressing   Problem: Skin Integrity: Goal: Risk for impaired skin integrity will decrease Outcome: Progressing   

## 2019-12-30 NOTE — Procedures (Signed)
Dr. Lowella Dandy [place] pt and determined that procedure would be safer to perform in CT

## 2019-12-31 LAB — BASIC METABOLIC PANEL
Anion gap: 7 (ref 5–15)
BUN: 24 mg/dL — ABNORMAL HIGH (ref 8–23)
CO2: 21 mmol/L — ABNORMAL LOW (ref 22–32)
Calcium: 8.2 mg/dL — ABNORMAL LOW (ref 8.9–10.3)
Chloride: 112 mmol/L — ABNORMAL HIGH (ref 98–111)
Creatinine, Ser: 1.04 mg/dL — ABNORMAL HIGH (ref 0.44–1.00)
GFR calc Af Amer: 56 mL/min — ABNORMAL LOW (ref >60)
GFR calc non Af Amer: 49 mL/min — ABNORMAL LOW (ref >60)
Glucose, Bld: 117 mg/dL — ABNORMAL HIGH (ref 70–99)
Potassium: 4.4 mmol/L (ref 3.5–5.1)
Sodium: 140 mmol/L (ref 135–145)

## 2019-12-31 LAB — CBC
HCT: 33.1 % — ABNORMAL LOW (ref 36.0–46.0)
Hemoglobin: 10.6 g/dL — ABNORMAL LOW (ref 12.0–15.0)
MCH: 29.2 pg (ref 26.0–34.0)
MCHC: 32 g/dL (ref 30.0–36.0)
MCV: 91.2 fL (ref 80.0–100.0)
Platelets: 198 10*3/uL (ref 150–400)
RBC: 3.63 MIL/uL — ABNORMAL LOW (ref 3.87–5.11)
RDW: 13.6 % (ref 11.5–15.5)
WBC: 14.5 10*3/uL — ABNORMAL HIGH (ref 4.0–10.5)
nRBC: 0 % (ref 0.0–0.2)

## 2019-12-31 LAB — GLUCOSE, CAPILLARY
Glucose-Capillary: 107 mg/dL — ABNORMAL HIGH (ref 70–99)
Glucose-Capillary: 121 mg/dL — ABNORMAL HIGH (ref 70–99)
Glucose-Capillary: 122 mg/dL — ABNORMAL HIGH (ref 70–99)
Glucose-Capillary: 144 mg/dL — ABNORMAL HIGH (ref 70–99)
Glucose-Capillary: 96 mg/dL (ref 70–99)
Glucose-Capillary: 97 mg/dL (ref 70–99)

## 2019-12-31 MED ORDER — SIMETHICONE 80 MG PO CHEW
80.0000 mg | CHEWABLE_TABLET | Freq: Four times a day (QID) | ORAL | Status: DC | PRN
  Administered 2019-12-31: 80 mg via ORAL
  Filled 2019-12-31 (×2): qty 1

## 2019-12-31 NOTE — Progress Notes (Addendum)
PROGRESS NOTE    [name]  EGB:[id number] DOB: [birth year] DOA: 12/28/2019 PCP: Shelva Majestic, MD     Brief Narrative:  From Dr. Verner Mould H&P: "Cheryl Atkinson is a 86 y.o. female with medical history significant of GERD, hiatal hernia, history of appendectomy, hypertension, hyperlipidemia, PAD, prediabetes presenting to the ED for evaluation of abdominal pain.  Patient reports 2-day history of right lower quadrant abdominal pain and nausea."   CT scan showed an a calculus hydropic gallbladder with inflammation and biliary duct dilatation.  Elevated white count and lactic acid.  LFTs unremarkable.  She was started on Rocephin and Flagyl.  General surgery was consulted.MRCP on 1/27 showing acute cholecystitis with probable calculus lodged in the neck of the gallbladder and dilatation of the common hepatic duct and common bile duct.  IR consulted for percutaneous drain that was placed on 1/28.   New events last 24 hours / Subjective: Patient had drain placed yesterday by IR.  Still tender in the right upper abdominal region.  Appetite is improving.  No nausea or vomiting.  No fevers.  While she would like to go home, general surgery team wants to keep an eye on her for another day.  Assessment & Plan:   Principal Problem:   Acute cholecystitis             Appreciate general surgery  Drain in place             Gen diet             Zosyn 3.375 g every 8 hours             Morphine and Zofran as needed   Active Problems: Sepsis (WBC, RR and T)             Monitor CBC                Lactate now WNL             Blood cx NGTD                Atherosclerotic PVD with intermittent claudication (HCC)             Hold Plavix for now pending  procedure     UTI (urinary tract infection)             Abx changed to Zosyn             Cx pending     Adrenal nodule (HCC)    Hypokalemia, clinically undetermined             Replace, monitor BMP   DVT prophylaxis: SCDs Code Status:  DNR Family Communication: Self Disposition Plan: Home; coming from home; no current barriers to discharge     Consultants:  Gen Surg IR    Procedures: Percutaneous cholecystostomy drain 12/30/19  Antimicrobials:  Anti-infectives (From admission, onward)    Start     Dose/Rate Route Frequency Ordered Stop   12/29/19 1400  piperacillin-tazobactam (ZOSYN) IVPB 3.375 g     3.375 g 12.5 mL/hr over 240 Minutes Intravenous Every 8 hours 12/29/19 1136     12/29/19 1200  cefTRIAXone (ROCEPHIN) 2 g in sodium chloride 0.9 % 100 mL IVPB  Status:  Discontinued     2 g 200 mL/hr over 30 Minutes Intravenous Every 24 hours 12/29/19 0623 12/29/19 1136   12/29/19 1000  metroNIDAZOLE (FLAGYL) IVPB 500 mg  Status:  Discontinued     500  mg 100 mL/hr over 60 Minutes Intravenous Every 8 hours 12/29/19 0623 12/29/19 1136   12/29/19 0415  metroNIDAZOLE (FLAGYL) IVPB 500 mg     500 mg 100 mL/hr over 60 Minutes Intravenous  Once 12/29/19 0401 12/29/19 0510   12/29/19 0245  cefTRIAXone (ROCEPHIN) 1 g in sodium chloride 0.9 % 100 mL IVPB     1 g 200 mL/hr over 30 Minutes Intravenous  Once 12/29/19 0231 12/29/19 0346        Objective: Vitals:   12/30/19 1520 12/30/19 1602 12/30/19 1950 12/31/19 0356  BP: (!) 141/56 (!) 116/51 (!) 127/54 (!) 123/57  Pulse: 80 65 76 82  Resp: (!) 24 (!) 21 20 18   Temp:  98.8 F (37.1 C) 98.6 F (37 C) 98.6 F (37 C)  TempSrc:  Oral Oral Oral  SpO2: 100% 100% 90% 97%  Weight:      Height:        Intake/Output Summary (Last 24 hours) at 12/31/2019 1145 Last data filed at 12/31/2019 0930 Gross per 24 hour  Intake 450 ml  Output 530 ml  Net -80 ml   Filed Weights   12/29/19 0656  Weight: 65.3 kg    Examination:  General exam: Appears calm and comfortable  Respiratory system: Clear to auscultation. Respiratory effort normal. No respiratory distress. No conversational dyspnea.  Cardiovascular system: S1 & S2 heard, RRR. No murmurs. No pedal  edema. Gastrointestinal system: Abdomen is mildly distended, dressing in the right upper quadrant area, tender to palpation diffusely worse in the right upper area. Normal bowel sounds heard. Central nervous system: Alert and oriented. No focal neurological deficits. Speech clear.  Extremities: Symmetric in appearance  Skin: No rashes, lesions or ulcers on exposed skin  Psychiatry: Judgement and insight appear normal. Mood & affect appropriate.   Data Reviewed: I have personally reviewed following labs and imaging studies  CBC: Recent Labs  Lab 12/28/19 2330 12/30/19 0235 12/31/19 0613  WBC 14.2* 21.5* 14.5*  HGB 12.0 11.2* 10.6*  HCT 37.3 34.4* 33.1*  MCV 91.9 91.2 91.2  PLT 273 212 198   Basic Metabolic Panel: Recent Labs  Lab 12/28/19 2330 12/30/19 0235 12/30/19 1210 12/31/19 0613  NA 135 138 139 140  K 3.9 3.0* 3.4* 4.4  CL 99 104 106 112*  CO2 23 23 22  21*  GLUCOSE 162* 110* 101* 117*  BUN 22 19 19  24*  CREATININE 0.86 0.81 1.18* 1.04*  CALCIUM 8.9 8.0* 8.1* 8.2*   GFR: Estimated Creatinine Clearance: 36.3 mL/min (A) (by C-G formula based on SCr of 1.04 mg/dL (H)). Liver Function Tests: Recent Labs  Lab 12/28/19 2330 12/30/19 0235  AST 22 20  ALT 16 16  ALKPHOS 97 83  BILITOT 0.9 1.0  PROT 7.2 6.0*  ALBUMIN 3.7 2.6*   Recent Labs  Lab 12/28/19 2330  LIPASE 16   Coagulation Profile: Recent Labs  Lab 12/30/19 1210  INR 1.4*   CBG: Recent Labs  Lab 12/30/19 1623 12/30/19 1953 12/30/19 2334 12/31/19 0357 12/31/19 0846  GLUCAP 80 113* 99 107* 97   Sepsis Labs: Recent Labs  Lab 12/29/19 0243 12/29/19 0715  LATICACIDVEN 2.1* 1.6    Recent Results (from the past 240 hour(s))  Culture, blood (Routine X 2) w Reflex to ID Panel     Status: None (Preliminary result)   Collection Time: 12/29/19  4:27 AM   Specimen: BLOOD LEFT FOREARM  Result Value Ref Range Status   Specimen Description BLOOD LEFT FOREARM  Final  Special Requests   Final     BOTTLES DRAWN AEROBIC AND ANAEROBIC Blood Culture results may not be optimal due to an inadequate volume of blood received in culture bottles   Culture   Final    NO GROWTH 2 DAYS Performed at [facility], [address] [place], [place] [address]    Report Status PENDING  Incomplete  Respiratory Panel by RT PCR (Flu A&B, Covid) - Nasopharyngeal Swab     Status: None   Collection Time: 12/29/19  5:04 AM   Specimen: Nasopharyngeal Swab  Result Value Ref Range Status   SARS Coronavirus 2 by RT PCR NEGATIVE NEGATIVE Final    Comment: (NOTE) SARS-CoV-2 target nucleic acids are NOT DETECTED. The SARS-CoV-2 RNA is generally detectable in upper respiratoy specimens during the acute phase of infection. The lowest concentration of SARS-CoV-2 viral copies this assay can detect is 131 copies/mL. A negative result does not preclude SARS-Cov-2 infection and should not be used as the sole basis for treatment or other patient management decisions. A negative result may occur with  improper specimen collection/handling, submission of specimen other than nasopharyngeal swab, presence of viral mutation(s) within the areas targeted by this assay, and inadequate number of viral copies (<131 copies/mL). A negative result must be combined with clinical observations, patient history, and epidemiological information. The expected result is Negative. Fact Sheet for Patients:  https://www.moore.com/https://www.fda.gov/media/142436/download Fact Sheet for Healthcare Providers:  https://www.young.biz/https://www.fda.gov/media/142435/download This test is not yet ap proved or cleared by the [place] FDA and  has been authorized for detection and/or diagnosis of SARS-CoV-2 by FDA under an Emergency Use Authorization (EUA). This EUA will remain  in effect (meaning this test can be used) for the duration of the COVID-19 declaration under Section 564(b)(1) of the Act, 21 U.S.C. section 360bbb-3(b)(1), unless the authorization is terminated  or revoked sooner.    Influenza A by PCR NEGATIVE NEGATIVE Final   Influenza B by PCR NEGATIVE NEGATIVE Final    Comment: (NOTE) The Xpert Xpress SARS-CoV-2/FLU/RSV assay is intended as an aid in  the diagnosis of influenza from Nasopharyngeal swab specimens and  should not be used as a sole basis for treatment. Nasal washings and  aspirates are unacceptable for Xpert Xpress SARS-CoV-2/FLU/RSV  testing. Fact Sheet for Patients: https://www.moore.com/https://www.fda.gov/media/142436/download Fact Sheet for Healthcare Providers: https://www.young.biz/https://www.fda.gov/media/142435/download This test is not yet approved or cleared by the [place] FDA and  has been authorized for detection and/or diagnosis of SARS-CoV-2 by  FDA under an Emergency Use Authorization (EUA). This EUA will remain  in effect (meaning this test can be used) for the duration of the  Covid-19 declaration under Section 564(b)(1) of the Act, 21  U.S.C. section 360bbb-3(b)(1), unless the authorization is  terminated or revoked. Performed at [facility], [address] [place], [place] [address]   Culture, blood (Routine X 2) w Reflex to ID Panel     Status: None (Preliminary result)   Collection Time: 12/29/19  7:15 AM   Specimen: BLOOD  Result Value Ref Range Status   Specimen Description BLOOD LEFT ANTECUBITAL  Final   Special Requests   Final    BOTTLES DRAWN AEROBIC AND ANAEROBIC Blood Culture results may not be optimal due to an excessive volume of blood received in culture bottles   Culture   Final    NO GROWTH 2 DAYS Performed at [facility], [address] [place], [place] [address]    Report Status PENDING  Incomplete  Aerobic/Anaerobic Culture (surgical/deep wound)     Status: None (Preliminary result)   Collection Time: 12/30/19  3:26 PM   Specimen: BILE  Result Value Ref Range Status   Specimen Description BILE  Final   Special Requests NONE  Final   Gram Stain   Final    ABUNDANT WBC PRESENT, PREDOMINANTLY  PMN NO ORGANISMS SEEN    Culture   Final    CULTURE REINCUBATED FOR BETTER GROWTH Performed at [facility], [address], [place], [place] [address]    Report Status PENDING  Incomplete      Radiology Studies: MR 3D Recon At Scanner  Result Date: 12/30/2019 CLINICAL DATA:  RIGHT upper quadrant pain. Cholecystitis suspected on CT 1 day prior. EXAM: MRI ABDOMEN WITHOUT AND WITH CONTRAST (INCLUDING MRCP) TECHNIQUE: Multiplanar multisequence MR imaging of the abdomen was performed both before and after the administration of intravenous contrast. Heavily T2-weighted images of the biliary and pancreatic ducts were obtained, and three-dimensional MRCP images were rendered by post processing. CONTRAST:  6.53mL GADAVIST GADOBUTROL 1 MMOL/ML IV SOLN COMPARISON:  CT 12/29/2019 FINDINGS: Lower chest: Small bilateral pleural effusions. Hepatobiliary: The gallbladder is markedly distended to 5.7 cm. There is extensive gallbladder wall thickening and pericholecystic fluid. Multiple gallstones layer dependently within the fundus of the gallbladder. Within the neck of the bladder, there is an obstructing calculus measuring approximately 5 mm on image 16/7. This potential calculus is also seen on image 33/13 of the MRCP series. While the common bile duct is dilated to 10 mm, there is no discrete filling defect within the common bile duct. The ductal dilatation extends the level of the ampulla. There is mild intrahepatic duct dilatation involving the LEFT hepatic lobe. No focal hepatic lesion. Postcontrast imaging is severely degraded by patient respiratory motion. Pancreas: Pancreatic duct is normal caliber. No pancreatic inflammation identified Spleen: Normal spleen Adrenals/urinary tract: Adrenal glands and kidneys are normal. The ureters and bladder normal. Stomach/Bowel: Stomach, small bowel, appendix, and cecum are normal. The colon and rectosigmoid colon are normal. Vascular/Lymphatic: Abdominal aorta is  normal caliber. No periportal or retroperitoneal adenopathy. No pelvic adenopathy. Reproductive: Other: No free fluid. Musculoskeletal: No aggressive osseous lesion. IMPRESSION: 1. Acute cholecystitis. Probable calculus lodged within the neck of the gallbladder. 2. Dilatation of the common hepatic duct and common bile duct without obstructing lesion identified. 3. Minimal intrahepatic duct dilatation. 4. No pancreatic duct dilatation.  No evidence of pancreatitis. Electronically Signed   By: Genevive Bi M.D.   On: 12/30/2019 07:53   MR ABDOMEN MRCP W WO CONTAST  Result Date: 12/30/2019 CLINICAL DATA:  RIGHT upper quadrant pain. Cholecystitis suspected on CT 1 day prior. EXAM: MRI ABDOMEN WITHOUT AND WITH CONTRAST (INCLUDING MRCP) TECHNIQUE: Multiplanar multisequence MR imaging of the abdomen was performed both before and after the administration of intravenous contrast. Heavily T2-weighted images of the biliary and pancreatic ducts were obtained, and three-dimensional MRCP images were rendered by post processing. CONTRAST:  6.4mL GADAVIST GADOBUTROL 1 MMOL/ML IV SOLN COMPARISON:  CT 12/29/2019 FINDINGS: Lower chest: Small bilateral pleural effusions. Hepatobiliary: The gallbladder is markedly distended to 5.7 cm. There is extensive gallbladder wall thickening and pericholecystic fluid. Multiple gallstones layer dependently within the fundus of the gallbladder. Within the neck of the bladder, there is an obstructing calculus measuring approximately 5 mm on image 16/7. This potential calculus is also seen on image 33/13 of the MRCP series. While the common bile duct is dilated to 10 mm, there is no  discrete filling defect within the common bile duct. The ductal dilatation extends the level of the ampulla. There is mild intrahepatic duct dilatation involving the LEFT hepatic lobe. No focal hepatic lesion. Postcontrast imaging is severely degraded by patient respiratory motion. Pancreas: Pancreatic duct is  normal caliber. No pancreatic inflammation identified Spleen: Normal spleen Adrenals/urinary tract: Adrenal glands and kidneys are normal. The ureters and bladder normal. Stomach/Bowel: Stomach, small bowel, appendix, and cecum are normal. The colon and rectosigmoid colon are normal. Vascular/Lymphatic: Abdominal aorta is normal caliber. No periportal or retroperitoneal adenopathy. No pelvic adenopathy. Reproductive: Other: No free fluid. Musculoskeletal: No aggressive osseous lesion. IMPRESSION: 1. Acute cholecystitis. Probable calculus lodged within the neck of the gallbladder. 2. Dilatation of the common hepatic duct and common bile duct without obstructing lesion identified. 3. Minimal intrahepatic duct dilatation. 4. No pancreatic duct dilatation.  No evidence of pancreatitis. Electronically Signed   By: Genevive BiStewart  Edmunds M.D.   On: 12/30/2019 07:53   IR PATIENT EVAL TECH 0-60 MINS  Result Date: 12/30/2019 Jacqualine CodeFalls, Heather H     12/30/2019  2:44 PM Dr. Lowella DandyHenn US pt and determined that procedure would be safer to perform in CT  CT IMAGE GUIDED DRAINAGE BY PERCUTANEOUS CATHETER  Result Date: 12/30/2019 INDICATION: 86 year old with acute calculus cholecystitis. Patient needs a percutaneous cholecystostomy tube. EXAM: CT-GUIDED CHOLECYSTOSTOMY TUBE PLACEMENT MEDICATIONS: Moderate sedation ANESTHESIA/SEDATION: Moderate (conscious) sedation was employed during this procedure. A total of Versed 1.5 mg and Fentanyl 75 mcg was administered intravenously. Moderate Sedation Time: 25 minutes. The patient's level of consciousness and vital signs were monitored continuously by radiology nursing throughout the procedure under my direct supervision. FLUOROSCOPY TIME:  None COMPLICATIONS: None immediate. PROCEDURE: Informed written consent was obtained from the patient after a thorough discussion of the procedural risks, benefits and alternatives. All questions were addressed. The gallbladder was evaluated with ultrasound.  Large distended abnormal gallbladder was identified but a large amount of surrounding bowel gas was also identified. Due to the overlying bowel gas, the procedure was performed with CT. Patient was placed supine on CT scanner and CT images were obtained. A timeout was performed prior to the initiation of the procedure. Right upper abdomen was prepped with chlorhexidine and sterile field was created. Maximal barrier sterile technique was utilized including caps, mask, sterile gowns, sterile gloves, sterile drape, hand hygiene and skin antiseptic. Skin and soft tissues were anesthetized with 1% lidocaine. Using CT guidance, an 18 gauge trocar needle was directed into the gallbladder. Yellow bilious fluid was rapidly draining from the 18 gauge needle. Stiff Amplatz wire was advanced into the gallbladder and the tract was dilated to accommodate a 10.2 JamaicaFrench multipurpose drain. 130 mL of yellowish bile was removed. There was some sediment within the aspirated bile. Fluid sample was sent for culture. Follow up CT images were obtained. Catheter was sutured to skin and attached to a gravity bag. FINDINGS: Markedly distended gallbladder with surrounding inflammatory changes. 10.2 French drain was successfully placed within the gallbladder. Gallbladder appears to be decompressed at the end of the procedure. IMPRESSION: CT-guided cholecystostomy tube placement. Electronically Signed   By: Richarda OverlieAdam  Henn M.D.   On: 12/30/2019 17:13      Scheduled Meds:  insulin aspart  0-6 Units Subcutaneous Q4H   Rotigotine  3 mg Transdermal QHS   sodium chloride flush  5 mL Intracatheter Q8H   Continuous Infusions:  piperacillin-tazobactam (ZOSYN)  IV 3.375 g (12/31/19 0938)     LOS: 2 days    Time  spent: 15 minutes   [place]on [place], DO [facility] 12/31/2019, 11:45 AM   Available via Epic secure chat 7am-7pm After these hours, please refer to coverage provider listed on [email or web address]

## 2019-12-31 NOTE — Progress Notes (Signed)
Referring Physician(s): Dr. Violeta Gelinas  Supervising Physician: Simonne Come  Patient Status:  [facility] - In-pt  Chief Complaint: Follow up cholecystostomy placement 1/28 by Dr. Lowella Dandy  Subjective:  Patient laying in bed, states her abdomen is sore pretty much all over but she is doing ok otherwise. Drain is tender to the touch "but not too bad." She says the nurses have been taking good care of her and her drain.   Allergies: Codeine  Medications: Prior to Admission medications   Medication Sig Start Date End Date Taking? Authorizing Provider  Albuterol Sulfate (PROAIR RESPICLICK) 108 (90 Base) MCG/ACT AEPB Inhale 2 puffs into the lungs every 6 (six) hours as needed (shortness of breath from COPD). 12/29/18  Yes Shelva Majestic, MD  aspirin EC 81 MG EC tablet Take 1 tablet (81 mg total) by mouth daily. 05/05/15  Yes Kilroy, Luke K, PA-C  atorvastatin (LIPITOR) 40 MG tablet Take 1 tablet (40 mg total) by mouth daily. 10/07/19  Yes Shelva Majestic, MD  clopidogrel (PLAVIX) 75 MG tablet Take 1 tablet (75 mg total) by mouth daily with breakfast. MUST KEEP APPOINTMENT 07/20/19 WITH DR Allyson Sabal FOR FUTURE REFILLS 06/11/19  Yes Runell Gess, MD  metFORMIN (GLUCOPHAGE) 500 MG tablet TAKE 1 (1) TABLET BY MOUTH DAILY Patient taking differently: Take 500 mg by mouth daily with breakfast.  10/05/19  Yes Shelva Majestic, MD  Multiple Vitamins-Minerals (PRESERVISION AREDS 2 PO) Take 1 tablet by mouth 2 (two) times daily.   Yes [provider]  Olmesartan-amLODIPine-HCTZ (TRIBENZOR) 40-10-12.5 MG TABS Take 1 tablet by mouth every morning. Patient taking differently: Take 1 tablet by mouth daily.  10/05/19  Yes Shelva Majestic, MD  polyethylene glycol [facility] / Ethelene Hal) packet Take 17 g by mouth at bedtime as needed for mild constipation. Reported on 03/14/2016   Yes [provider]  Rotigotine 3 MG/24HR PT24 Place 3 mg onto the skin at bedtime. 03/31/19  Yes Shelva Majestic, MD    atorvastatin (LIPITOR) 40 MG tablet Take 1 tablet (40 mg total) by mouth daily at 6 PM. Patient not taking: Reported on 12/29/2019 10/05/19   Shelva Majestic, MD  ciprofloxacin (CIPRO) 250 MG tablet Take 1 tablet (250 mg total) by mouth every 12 (twelve) hours. Patient not taking: Reported on 12/29/2019 08/20/19   Eustace Moore, MD     Vital Signs: BP (!) 123/57 (BP Location: Left Arm)   Pulse 82   Temp 98.6 F (37 C) (Oral)   Resp 18   Ht 5\' 6"  (1.676 m)   Wt 143 lb 15.4 oz (65.3 kg)   LMP  (LMP Unknown)   SpO2 97%   BMI 23.24 kg/m   Physical Exam Vitals and nursing note reviewed.  Constitutional:      General: She is not in acute distress. HENT:     Head: Normocephalic.  Cardiovascular:     Rate and Rhythm: Normal rate.  Pulmonary:     Effort: Pulmonary effort is normal.  Abdominal:     General: There is distension.     Palpations: Abdomen is soft.     Tenderness: There is abdominal tenderness (Diffuse - mild; mostly around RUQ/drain insertion site).     Comments: (+) cholecystostomy to gravity, bag was just emptied so could not visualize output. Per patient appears 'sandy' Flushes easily. Suture/stat lock in tact. Clean, dry, dressed appropriately.  Skin:    General: Skin is warm and dry.  Neurological:  Mental Status: She is alert. Mental status is at baseline.     Imaging: CT ABDOMEN PELVIS W CONTRAST  Result Date: 12/29/2019 CLINICAL DATA:  Sharp constant right lower quadrant abdominal pain with nausea and constipation EXAM: CT ABDOMEN AND PELVIS WITH CONTRAST TECHNIQUE: Multidetector CT imaging of the abdomen and pelvis was performed using the standard protocol following bolus administration of intravenous contrast. CONTRAST:  100mL OMNIPAQUE IOHEXOL 300 MG/ML  SOLN COMPARISON:  Radiograph Apr 15, 2011, CT lumbar spine January 04, 2011 FINDINGS: Lower chest: Bandlike areas of atelectasis in the lung base with more dependent atelectasis in the posterior  right lower lobe. Mild cardiomegaly with coronary artery atherosclerosis. No pericardial effusion. Hepatobiliary: No focal liver lesions are identified. Gallbladder is markedly hydropic mural thickening. There is extensive pericholecystic inflammation with mucosal hyperemia and marked intra and extrahepatic biliary ductal dilatation to the level of the pancreatic head. Inflammation extends inferolaterally to the gallbladder fossa into the right pericolic gutter as well as with stranding and phlegmon about the proximal duodenal sweep likely secondary to the gallbladder process. Pancreas: Pancreas is largely atrophic. No pancreatic ductal dilatation. Spleen: Normal in size without focal abnormality. Adrenals/Urinary Tract: 1.4 cm nodule in the body of the left adrenal gland is nonspecific. None no right adrenal nodules. 1 cm fluid attenuation cyst seen in the interpolar left kidney. No worrisome renal lesions. No hydronephrosis or urolithiasis. Urinary bladder appears circumferentially thickened with multiple bladder diverticula seen along the right lateral aspect of the bladder. Some hazy stranding is notable about these multiple bladder diverticula. Stomach/Bowel: Distal esophagus and stomach are unremarkable. Stranding adjacent the proximal duodenum is likely secondary to the gallbladder. No small bowel dilatation or wall thickening. Cecum is slightly displaced into the midline abdomen. The appendix is surgically absent. No colonic dilatation or wall thickening. Scattered colonic diverticula without focal pericolonic inflammation to suggest diverticulitis. Vascular/Lymphatic: Atherosclerotic plaque within the normal caliber aorta. Focal fusiform ectasia of the infrarenal abdominal aorta measuring up to 2.5 cm in diameter. Reactive adenopathy in the upper abdomen. No pathologically enlarged lymph nodes in the abdomen or pelvis. Reproductive: Uterus is surgically absent. No concerning adnexal lesions. Other: No  abdominopelvic free fluid or free gas. Stranding and inflammation extends from the gallbladder to the right paracolic gutter. Additional hazy stranding is noted upon the multiple bladder diverticula in the pelvis. No bowel containing hernias. Musculoskeletal: The osseous structures appear diffusely demineralized which may limit detection of small or nondisplaced fractures. Multilevel degenerative changes are present in the imaged portions of the spine. 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 No acute osseous abnormality or suspicious osseous lesion. IMPRESSION: 1. Markedly hydropic gallbladder with extensive pericholecystic inflammation and marked intra and extrahepatic biliary ductal dilatation to the level of the pancreatic head. No aggressive or invasive features are seen. Findings are compatible with acute cholecystitis. 2. Extensive intra and extrahepatic biliary ductal dilatation with acute tapering at the level of the pancreatic head. No visible gallstones. Could consider right upper quadrant ultrasound or MRCP for further evaluation. 3. Urinary bladder appears circumferentially thickened with multiple bladder diverticula seen along the right lateral aspect of the bladder. Some hazy stranding is noted about these bladder diverticula. Findings are suggestive of cystitis. Correlate with urinalysis. 4. Indeterminate 1.4 cm nodule in the body of the left adrenal gland. If there is no history of malignancy, further characterization could be performed with adrenal mass protocol CT or MRI. This recommendation follows ACR consensus guidelines: Management of Incidental  Adrenal Masses: A White Paper of the ACR Incidental Findings Committee. J Am Coll Radiol 2017;14:1038-1044. 5. Focal fusiform ectasia of the infrarenal abdominal aorta measuring up to 2.5 cm in diameter. Ectatic abdominal aorta at risk for aneurysm development. Recommend followup by ultrasound in  5 years. This recommendation follows ACR consensus guidelines: White Paper of the ACR Incidental Findings Committee II on Vascular Findings. J Am Coll Radiol 2013; 10:789-794. Aortic aneurysm NOS (ICD10-I71.9) 6.  Aortic Atherosclerosis (ICD10-I70.0). These results were called by telephone at the time of interpretation on 12/29/2019 at 4:03 am to provider Dr Clayborne Dana, who verbally acknowledged these results. Electronically Signed   By: Kreg Shropshire M.D.   On: 12/29/2019 04:03   MR 3D Recon At Scanner  Result Date: 12/30/2019 CLINICAL DATA:  RIGHT upper quadrant pain. Cholecystitis suspected on CT 1 day prior. EXAM: MRI ABDOMEN WITHOUT AND WITH CONTRAST (INCLUDING MRCP) TECHNIQUE: Multiplanar multisequence MR imaging of the abdomen was performed both before and after the administration of intravenous contrast. Heavily T2-weighted images of the biliary and pancreatic ducts were obtained, and three-dimensional MRCP images were rendered by post processing. CONTRAST:  6.66mL GADAVIST GADOBUTROL 1 MMOL/ML IV SOLN COMPARISON:  CT 12/29/2019 FINDINGS: Lower chest: Small bilateral pleural effusions. Hepatobiliary: The gallbladder is markedly distended to 5.7 cm. There is extensive gallbladder wall thickening and pericholecystic fluid. Multiple gallstones layer dependently within the fundus of the gallbladder. Within the neck of the bladder, there is an obstructing calculus measuring approximately 5 mm on image 16/7. This potential calculus is also seen on image 33/13 of the MRCP series. While the common bile duct is dilated to 10 mm, there is no discrete filling defect within the common bile duct. The ductal dilatation extends the level of the ampulla. There is mild intrahepatic duct dilatation involving the LEFT hepatic lobe. No focal hepatic lesion. Postcontrast imaging is severely degraded by patient respiratory motion. Pancreas: Pancreatic duct is normal caliber. No pancreatic inflammation identified Spleen: Normal  spleen Adrenals/urinary tract: Adrenal glands and kidneys are normal. The ureters and bladder normal. Stomach/Bowel: Stomach, small bowel, appendix, and cecum are normal. The colon and rectosigmoid colon are normal. Vascular/Lymphatic: Abdominal aorta is normal caliber. No periportal or retroperitoneal adenopathy. No pelvic adenopathy. Reproductive: Other: No free fluid. Musculoskeletal: No aggressive osseous lesion. IMPRESSION: 1. Acute cholecystitis. Probable calculus lodged within the neck of the gallbladder. 2. Dilatation of the common hepatic duct and common bile duct without obstructing lesion identified. 3. Minimal intrahepatic duct dilatation. 4. No pancreatic duct dilatation.  No evidence of pancreatitis. Electronically Signed   By: Genevive Bi M.D.   On: 12/30/2019 07:53   MR ABDOMEN MRCP W WO CONTAST  Result Date: 12/30/2019 CLINICAL DATA:  RIGHT upper quadrant pain. Cholecystitis suspected on CT 1 day prior. EXAM: MRI ABDOMEN WITHOUT AND WITH CONTRAST (INCLUDING MRCP) TECHNIQUE: Multiplanar multisequence MR imaging of the abdomen was performed both before and after the administration of intravenous contrast. Heavily T2-weighted images of the biliary and pancreatic ducts were obtained, and three-dimensional MRCP images were rendered by post processing. CONTRAST:  6.24mL GADAVIST GADOBUTROL 1 MMOL/ML IV SOLN COMPARISON:  CT 12/29/2019 FINDINGS: Lower chest: Small bilateral pleural effusions. Hepatobiliary: The gallbladder is markedly distended to 5.7 cm. There is extensive gallbladder wall thickening and pericholecystic fluid. Multiple gallstones layer dependently within the fundus of the gallbladder. Within the neck of the bladder, there is an obstructing calculus measuring approximately 5 mm on image 16/7. This potential calculus is also seen on image 33/13 of  the MRCP series. While the common bile duct is dilated to 10 mm, there is no discrete filling defect within the common bile duct. The  ductal dilatation extends the level of the ampulla. There is mild intrahepatic duct dilatation involving the LEFT hepatic lobe. No focal hepatic lesion. Postcontrast imaging is severely degraded by patient respiratory motion. Pancreas: Pancreatic duct is normal caliber. No pancreatic inflammation identified Spleen: Normal spleen Adrenals/urinary tract: Adrenal glands and kidneys are normal. The ureters and bladder normal. Stomach/Bowel: Stomach, small bowel, appendix, and cecum are normal. The colon and rectosigmoid colon are normal. Vascular/Lymphatic: Abdominal aorta is normal caliber. No periportal or retroperitoneal adenopathy. No pelvic adenopathy. Reproductive: Other: No free fluid. Musculoskeletal: No aggressive osseous lesion. IMPRESSION: 1. Acute cholecystitis. Probable calculus lodged within the neck of the gallbladder. 2. Dilatation of the common hepatic duct and common bile duct without obstructing lesion identified. 3. Minimal intrahepatic duct dilatation. 4. No pancreatic duct dilatation.  No evidence of pancreatitis. Electronically Signed   By: Suzy Bouchard M.D.   On: 12/30/2019 07:53   IR PATIENT EVAL TECH 0-60 MINS  Result Date: 12/30/2019 Araceli Bouche     12/30/2019  2:44 PM Dr. Anselm Pancoast [place] pt and determined that procedure would be safer to perform in CT  CT IMAGE GUIDED DRAINAGE BY PERCUTANEOUS CATHETER  Result Date: 12/30/2019 INDICATION: 86 year old with acute calculus cholecystitis. Patient needs a percutaneous cholecystostomy tube. EXAM: CT-GUIDED CHOLECYSTOSTOMY TUBE PLACEMENT MEDICATIONS: Moderate sedation ANESTHESIA/SEDATION: Moderate (conscious) sedation was employed during this procedure. A total of Versed 1.5 mg and Fentanyl 75 mcg was administered intravenously. Moderate Sedation Time: 25 minutes. The patient's level of consciousness and vital signs were monitored continuously by radiology nursing throughout the procedure under my direct supervision. FLUOROSCOPY TIME:  None  COMPLICATIONS: None immediate. PROCEDURE: Informed written consent was obtained from the patient after a thorough discussion of the procedural risks, benefits and alternatives. All questions were addressed. The gallbladder was evaluated with ultrasound. Large distended abnormal gallbladder was identified but a large amount of surrounding bowel gas was also identified. Due to the overlying bowel gas, the procedure was performed with CT. Patient was placed supine on CT scanner and CT images were obtained. A timeout was performed prior to the initiation of the procedure. Right upper abdomen was prepped with chlorhexidine and sterile field was created. Maximal barrier sterile technique was utilized including caps, mask, sterile gowns, sterile gloves, sterile drape, hand hygiene and skin antiseptic. Skin and soft tissues were anesthetized with 1% lidocaine. Using CT guidance, an 18 gauge trocar needle was directed into the gallbladder. Yellow bilious fluid was rapidly draining from the 18 gauge needle. Stiff Amplatz wire was advanced into the gallbladder and the tract was dilated to accommodate a 10.2 [place] multipurpose drain. 130 mL of yellowish bile was removed. There was some sediment within the aspirated bile. Fluid sample was sent for culture. Follow up CT images were obtained. Catheter was sutured to skin and attached to a gravity bag. FINDINGS: Markedly distended gallbladder with surrounding inflammatory changes. 10.2 French drain was successfully placed within the gallbladder. Gallbladder appears to be decompressed at the end of the procedure. IMPRESSION: CT-guided cholecystostomy tube placement. Electronically Signed   By: Markus Daft M.D.   On: 12/30/2019 17:13    Labs:  CBC: Recent Labs    10/05/19 1159 12/28/19 2330 12/30/19 0235 12/31/19 0613  WBC 8.6 14.2* 21.5* 14.5*  HGB 11.7* 12.0 11.2* 10.6*  HCT 35.7* 37.3 34.4* 33.1*  PLT 261.0 273  212 198    COAGS: Recent Labs    12/30/19 1210    INR 1.4*    BMP: Recent Labs    12/28/19 2330 12/30/19 0235 12/30/19 1210 12/31/19 0613  NA 135 138 139 140  K 3.9 3.0* 3.4* 4.4  CL 99 104 106 112*  CO2 23 23 22  21*  GLUCOSE 162* 110* 101* 117*  BUN 22 19 19  24*  CALCIUM 8.9 8.0* 8.1* 8.2*  CREATININE 0.86 0.81 1.18* 1.04*  GFRNONAA >60 >60 42* 49*  GFRAA >60 >60 48* 56*    LIVER FUNCTION TESTS: Recent Labs    10/05/19 1159 12/28/19 2330 12/30/19 0235  BILITOT 0.5 0.9 1.0  AST 17 22 20   ALT 11 16 16   ALKPHOS 109 97 83  PROT 7.3 7.2 6.0*  ALBUMIN 4.3 3.7 2.6*    Assessment and Plan:  86 y/o F s/p percutaneous cholecystostomy placed 1/28 in IR seen today for routine follow up. Patient appropriately tender around drain insertion site, no leakage or bleeding noted. Suture/stat lock in tact, dressed appropriately.   Per I/O ~300 cc output since placement, flushes easily on my exam today, no concerns noted by staff per chart. Preliminary culture shows NGTD.  Continue TID flushes with 5 cc NS, record output Qshift, dressing changes QD. Drain to remain minimum of 6 weeks unless she undergoes cholecystectomy. If drain is to remain long term she will need routine exchanges in IR - will discuss discharge planning when she is closer to that point.  IR will continue to follow - please call with questions or concerns.   Electronically Signed: , PA-C 12/31/2019, 2:58 PM   I spent a total of 15 Minutes at the the patient's bedside AND on the patient's hospital floor or unit, greater than 50% of which was counseling/coordinating care for perc chole follow up.

## 2019-12-31 NOTE — Evaluation (Signed)
Physical Therapy Evaluation Patient Details Name: Cheryl Atkinson MRN: [id number] DOB: Sep 21, 1931 Today's Date: 12/31/2019   History of Present Illness  Patient is a 86 y/o female who presents with abdominal pain now due to acute cholecystitis s/p cholecystectomy drain placement 1/28. PMH includes PAD, HTN.  Clinical Impression  Patient presents with generalized weakness, pain, impaired balance, deconditioning and impaired mobility s/p above. Pt lives with son but is alone during the day and is Mod I using rollator PTA. Today, pt requires Max-Mod A for bed mobility and standing transfers with use of RW. Able to take a few shuffling steps to get to chair with Mod A with a few LOB posteriorly. Pt not safe to be home alone as she is a high fall risk. Discussed recommendation for SNF however pt to find out if she can get her housekeeper to stay with her while her son works as she prefers to d/c home. Would benefit from SNF to maximize independence and mobility prior to return home. Will follow acutely.    Follow Up Recommendations SNF;Supervision for mobility/OOB    Equipment Recommendations  None recommended by PT    Recommendations for Other Services       Precautions / Restrictions Precautions Precautions: Fall Precaution Comments: drain Restrictions Weight Bearing Restrictions: No      Mobility  Bed Mobility Overal bed mobility: Needs Assistance Bed Mobility: Rolling;Sidelying to Sit Rolling: Max assist Sidelying to sit: HOB elevated;Max assist       General bed mobility comments: Step by step cues for technique; assist to roll towards right, reach for rail, bring LEs off bed and elevate trunk. posterior bias.  Transfers Overall transfer level: Needs assistance Equipment used: Rolling walker (2 wheeled) Transfers: Sit to/from Stand Sit to Stand: Max assist;Mod assist;From elevated surface         General transfer comment: Max A on first attempt to stand but pt with  little effort progressed to Mod A to power to standing with cues for hand placement. Posterior bias.  Ambulation/Gait Ambulation/Gait assistance: Mod assist Gait Distance (Feet): 4 Feet Assistive device: Rolling walker (2 wheeled) Gait Pattern/deviations: Trunk flexed;Leaning posteriorly;Shuffle Gait velocity: decreased   General Gait Details: Able to take a few steps to get to chair with Mod A for balance, assist for weight shifting and for support. LOB posteriorly x3.  Stairs            Wheelchair Mobility    Modified Rankin (Stroke Patients Only)       Balance Overall balance assessment: Needs assistance Sitting-balance support: Feet supported;Bilateral upper extremity supported Sitting balance-Leahy Scale: Poor Sitting balance - Comments: Requires UE support to maintain static sitting balance with psoterior bias. Postural control: Posterior lean Standing balance support: During functional activity Standing balance-Leahy Scale: Poor Standing balance comment: Requires UE support and external support for standing balance.                             Pertinent Vitals/Pain Pain Assessment: 0-10 Pain Score: 10-Worst pain ever Pain Location: Abdomen Pain Descriptors / Indicators: Sore Pain Intervention(s): Repositioned;Monitored during session    Home Living Family/patient expects to be discharged to:: Private residence Living Arrangements: Children(son) Available Help at Discharge: Family;Available PRN/intermittently Type of Home: House Home Access: Stairs to enter   Entrance Stairs-Number of Steps: 1 Home Layout: One level Home Equipment: Walker - 4 wheels;Cane - single point Additional Comments: inquiring to see if her housekeeper could  come stay with her at d/c    Prior Function Level of Independence: Independent with assistive device(s)         Comments: Uses rollator for ambulation. Does own ADLs, cooks/cleans. Has a housekeeper that comes  every other week.     Hand Dominance   Dominant Hand: Right    Extremity/Trunk Assessment   Upper Extremity Assessment Upper Extremity Assessment: Defer to OT evaluation    Lower Extremity Assessment Lower Extremity Assessment: Generalized weakness(stiffness noted throughout.)    Cervical / Trunk Assessment Cervical / Trunk Assessment: Kyphotic  Communication   Communication: No difficulties  Cognition Arousal/Alertness: Awake/alert Behavior During Therapy: WFL for tasks assessed/performed Overall Cognitive Status: Within Functional Limits for tasks assessed                                 General Comments: for basic mobility tasks      General Comments General comments (skin integrity, edema, etc.): Some wheezing noted with bed mobility.    Exercises     Assessment/Plan    PT Assessment Patient needs continued PT services  PT Problem List Decreased strength;Decreased mobility;Pain;Decreased balance;Decreased activity tolerance       PT Treatment Interventions Therapeutic activities;Gait training;Therapeutic exercise;Patient/family education;Balance training;Functional mobility training;DME instruction;Wheelchair mobility training    PT Goals (Current goals can be found in the Care Plan section)  Acute Rehab PT Goals Patient Stated Goal: to go home PT Goal Formulation: With patient Time For Goal Achievement: 01/14/20 Potential to Achieve Goals: Fair    Frequency Min 3X/week   Barriers to discharge Decreased caregiver support lives alone    Co-evaluation               AM-PAC PT "6 Clicks" Mobility  Outcome Measure Help needed turning from your back to your side while in a flat bed without using bedrails?: Total Help needed moving from lying on your back to sitting on the side of a flat bed without using bedrails?: Total Help needed moving to and from a bed to a chair (including a wheelchair)?: A Lot Help needed standing up from a  chair using your arms (e.g., wheelchair or bedside chair)?: A Lot Help needed to walk in hospital room?: A Lot Help needed climbing 3-5 steps with a railing? : Total 6 Click Score: 9    End of Session Equipment Utilized During Treatment: Gait belt Activity Tolerance: Patient tolerated treatment well Patient left: in chair;with call bell/phone within reach;with chair alarm set Nurse Communication: Mobility status;Need for lift equipment(stedy to transfer back to bed) PT Visit Diagnosis: Pain;Muscle weakness (generalized) (M62.81);Unsteadiness on feet (R26.81);Difficulty in walking, not elsewhere classified (R26.2) Pain - part of body: (abdomen)    Time: 9604-5409 PT Time Calculation (min) (ACUTE ONLY): 25 min   Charges:   PT Evaluation $PT Eval Moderate Complexity: 1 Mod PT Treatments $Therapeutic Activity: 8-22 mins        Marisa Severin, PT, DPT Acute Rehabilitation Services Pager [phone] Office [phone]      Cheryl Atkinson 12/31/2019, 4:02 PM

## 2019-12-31 NOTE — Progress Notes (Signed)
CC: Abdominal pain  Subjective: In bed with a regular diet in front of her.  She still pretty tender.  Is not a lot of drainage from the cholecystostomy tube and the fluid is clear.  Objective: Vital signs in last 24 hours: Temp:  [98.6 F (37 C)-98.8 F (37.1 C)] 98.6 F (37 C) (01/29 0356) Pulse Rate:  [65-85] 82 (01/29 0356) Resp:  [18-33] 18 (01/29 0356) BP: (116-151)/(51-75) 123/57 (01/29 0356) SpO2:  [90 %-100 %] 97 % (01/29 0356) Last BM Date: 12/30/19 120 p.o. 140 IV Urine 1150 Drains 180 BM x3 Afebrile vital signs are stable labs are stable WBC improving   Intake/Output from previous day: 01/28 0701 - 01/29 0700 In: 130 [P.O.:120; I.V.:10] Out: 1330 [Urine:1150; Drains:180] Intake/Output this shift: No intake/output data recorded.  General appearance: alert, cooperative and no distress Resp: clear to auscultation bilaterally Cardio: Regular rate and rhythm GI: Her bili soft but she still tender with some mild peritonitis still present.  IR drains in place with clear fluid draining from it.  Her tenderness is about the same as before she cannot tell whether it is from the drain or her original discomfort. Extremities: extremities normal, atraumatic, no cyanosis or edema  Lab Results:  Recent Labs    12/30/19 0235 12/31/19 0613  WBC 21.5* 14.5*  HGB 11.2* 10.6*  HCT 34.4* 33.1*  PLT 212 198    BMET Recent Labs    12/30/19 1210 12/31/19 0613  NA 139 140  K 3.4* 4.4  CL 106 112*  CO2 22 21*  GLUCOSE 101* 117*  BUN 19 24*  CREATININE 1.18* 1.04*  CALCIUM 8.1* 8.2*   PT/INR Recent Labs    12/30/19 1210  LABPROT 16.8*  INR 1.4*    Recent Labs  Lab 12/28/19 2330 12/30/19 0235  AST 22 20  ALT 16 16  ALKPHOS 97 83  BILITOT 0.9 1.0  PROT 7.2 6.0*  ALBUMIN 3.7 2.6*     Lipase     Component Value Date/Time   LIPASE 16 12/28/2019 2330     Medications: . insulin aspart  0-6 Units Subcutaneous Q4H  . Rotigotine  3 mg  Transdermal QHS  . sodium chloride flush  5 mL Intracatheter Q8H    Assessment/Plan GERD HTN HLD PVD on plavix - followed by Dr. Allyson Sabal, hold plavix COPD Pre-DM - on metformin. A1c 6.1 (10/2019) Hypothyroidism RLS Code status DNR  Recurrent UTIs UTI - Ucx pending, per primary  Abdominal pain/nausea Acute cholecystitis  IR CT-guided cholecystostomy tube placement 12/30/2019 - Patient with onset of upper abdominal pain yesterday morning associated with nausea. CT scan shows acalculoushydropic gallbladderwithextensive pericholecystic inflammation and marked intra and extrahepatic biliary ductal dilatation to the level of the pancreatic head. LFTs are WNL. Will first obtain MRCP for further evaluation of ductal dilatation and to rule out a choledocholithiasis. If this is negative, plan for HIDA to confirm cholecystitis. If cystic duct is obstructed we discussed laparoscopic cholecystectomy versus percutaneous cholecystostomy tube placement. With her age and multiple co-morbidities patient would be high risk with general anesthesia and surgical intervention. If needed, we would recommend a perc chole tube. Recheck labs in AM. Switch antibiotics to zosyn. MRCP: Acute cholecystitis probable callus lodged within the neck of the gallbladder, dilatation of the common hepatic duct and common bile duct without obstructing lesion identified.  Minimal intrahepatic duct dilatation, no pancreatic duct dilatation no evidence of pancreatitis. WBC 14.2>>21.5>>14.5(1/29)   ID -rocephin/flagyl 1/27>>1/27. Zosyn 1/27>> day 3 VTE -  SCDs, ok for chemical DVT prophylaxis from surgical standpoint FEN -IVF, NPO Foley -none Follow up -TBD  Plan: Continue IV fluids and antibiotics.  We will continue to follow with you.       LOS: 2 days    Cheryl Atkinson 12/31/2019 Please see Amion

## 2019-12-31 NOTE — Plan of Care (Signed)

## 2020-01-01 DIAGNOSIS — I70219 Atherosclerosis of native arteries of extremities with intermittent claudication, unspecified extremity: Secondary | ICD-10-CM

## 2020-01-01 DIAGNOSIS — N3090 Cystitis, unspecified without hematuria: Secondary | ICD-10-CM

## 2020-01-01 DIAGNOSIS — I77811 Abdominal aortic ectasia: Secondary | ICD-10-CM

## 2020-01-01 DIAGNOSIS — E278 Other specified disorders of adrenal gland: Secondary | ICD-10-CM

## 2020-01-01 LAB — COMPREHENSIVE METABOLIC PANEL
ALT: 16 U/L (ref 0–44)
AST: 17 U/L (ref 15–41)
Albumin: 2.2 g/dL — ABNORMAL LOW (ref 3.5–5.0)
Alkaline Phosphatase: 63 U/L (ref 38–126)
Anion gap: 9 (ref 5–15)
BUN: 30 mg/dL — ABNORMAL HIGH (ref 8–23)
CO2: 20 mmol/L — ABNORMAL LOW (ref 22–32)
Calcium: 8.1 mg/dL — ABNORMAL LOW (ref 8.9–10.3)
Chloride: 110 mmol/L (ref 98–111)
Creatinine, Ser: 0.88 mg/dL (ref 0.44–1.00)
GFR calc Af Amer: >60 mL/min (ref >60)
GFR calc non Af Amer: 59 mL/min — ABNORMAL LOW (ref >60)
Glucose, Bld: 123 mg/dL — ABNORMAL HIGH (ref 70–99)
Potassium: 3.7 mmol/L (ref 3.5–5.1)
Sodium: 139 mmol/L (ref 135–145)
Total Bilirubin: 0.8 mg/dL (ref 0.3–1.2)
Total Protein: 5.6 g/dL — ABNORMAL LOW (ref 6.5–8.1)

## 2020-01-01 LAB — HEMOGLOBIN A1C
Hgb A1c MFr Bld: 5.9 % — ABNORMAL HIGH (ref 4.8–5.6)
Mean Plasma Glucose: 122.63 mg/dL

## 2020-01-01 LAB — CBC
HCT: 30.8 % — ABNORMAL LOW (ref 36.0–46.0)
Hemoglobin: 10.2 g/dL — ABNORMAL LOW (ref 12.0–15.0)
MCH: 29.9 pg (ref 26.0–34.0)
MCHC: 33.1 g/dL (ref 30.0–36.0)
MCV: 90.3 fL (ref 80.0–100.0)
Platelets: 202 10*3/uL (ref 150–400)
RBC: 3.41 MIL/uL — ABNORMAL LOW (ref 3.87–5.11)
RDW: 13.7 % (ref 11.5–15.5)
WBC: 9 10*3/uL (ref 4.0–10.5)
nRBC: 0 % (ref 0.0–0.2)

## 2020-01-01 LAB — GLUCOSE, CAPILLARY
Glucose-Capillary: 114 mg/dL — ABNORMAL HIGH (ref 70–99)
Glucose-Capillary: 106 mg/dL — ABNORMAL HIGH (ref 70–99)
Glucose-Capillary: 117 mg/dL — ABNORMAL HIGH (ref 70–99)
Glucose-Capillary: 124 mg/dL — ABNORMAL HIGH (ref 70–99)
Glucose-Capillary: 123 mg/dL — ABNORMAL HIGH (ref 70–99)

## 2020-01-01 MED ORDER — ASPIRIN EC 81 MG PO TBEC
81.0000 mg | DELAYED_RELEASE_TABLET | Freq: Every day | ORAL | Status: DC
  Administered 2020-01-01 – 2020-01-03 (×3): 81 mg via ORAL
  Filled 2020-01-01 (×4): qty 1

## 2020-01-01 MED ORDER — INSULIN ASPART 100 UNIT/ML ~~LOC~~ SOLN
0.0000 [IU] | Freq: Three times a day (TID) | SUBCUTANEOUS | Status: DC
  Administered 2020-01-01 – 2020-01-03 (×2): 1 [IU] via SUBCUTANEOUS

## 2020-01-01 MED ORDER — BISACODYL 5 MG PO TBEC
10.0000 mg | DELAYED_RELEASE_TABLET | Freq: Once | ORAL | Status: AC
  Administered 2020-01-01: 10 mg via ORAL
  Filled 2020-01-01: qty 2

## 2020-01-01 MED ORDER — CLOPIDOGREL BISULFATE 75 MG PO TABS
75.0000 mg | ORAL_TABLET | Freq: Every day | ORAL | Status: DC
  Administered 2020-01-02 – 2020-01-03 (×2): 75 mg via ORAL
  Filled 2020-01-01 (×2): qty 1

## 2020-01-01 MED ORDER — BISACODYL 10 MG RE SUPP: 10.0000 mg | Freq: Once | RECTAL | Status: DC

## 2020-01-01 MED ORDER — ALBUTEROL SULFATE (2.5 MG/3ML) 0.083% IN NEBU: 2.5000 mg | INHALATION_SOLUTION | Freq: Four times a day (QID) | RESPIRATORY_TRACT | Status: DC | PRN

## 2020-01-01 MED ORDER — ATORVASTATIN CALCIUM 40 MG PO TABS
40.0000 mg | ORAL_TABLET | Freq: Every day | ORAL | Status: DC
  Administered 2020-01-01 – 2020-01-03 (×3): 40 mg via ORAL
  Filled 2020-01-01 (×3): qty 1

## 2020-01-01 MED ORDER — POLYETHYLENE GLYCOL 3350 17 G PO PACK
17.0000 g | PACK | Freq: Every day | ORAL | Status: DC
  Administered 2020-01-01: 17 g via ORAL
  Filled 2020-01-01: qty 1

## 2020-01-01 NOTE — Plan of Care (Signed)

## 2020-01-01 NOTE — Progress Notes (Signed)
Patient ID: Cheryl Atkinson, female   DOB: [birth year], 86 y.o.   MRN: [id number] [facility] Progress Note:   * No surgery found *  Subjective: Mental status is clear.  Having some cramping pains and she reports that she needs to have a BM Objective: Vital signs in last 24 hours: Temp:  [98.2 F (36.8 C)-98.6 F (37 C)] 98.2 F (36.8 C) (01/30 0402) Pulse Rate:  [60-81] 64 (01/30 0402) Resp:  [17-18] 17 (01/30 0402) BP: (122-128)/(66-74) 126/72 (01/30 0402) SpO2:  [95 %-100 %] 97 % (01/30 0402)  Intake/Output from previous day: 01/29 0701 - 01/30 0700 In: 1105.9 [P.O.:936; I.V.:10; IV Piggyback:149.9] Out: 475 [Urine:200; Drains:275] Intake/Output this shift: Total I/O In: 364 [P.O.:364] Out: -   Physical Exam: Work of breathing is normal.  Drain in place  Lab Results:  Results for orders placed or performed during the hospital encounter of 12/28/19 (from the past 48 hour(s))  Glucose, capillary     Status: None   Collection Time: 12/30/19 11:59 AM  Result Value Ref Range   Glucose-Capillary 76 70 - 99 mg/dL  Basic metabolic panel     Status: Abnormal   Collection Time: 12/30/19 12:10 PM  Result Value Ref Range   Sodium 139 135 - 145 mmol/L   Potassium 3.4 (L) 3.5 - 5.1 mmol/L   Chloride 106 98 - 111 mmol/L   CO2 22 22 - 32 mmol/L   Glucose, Bld 101 (H) 70 - 99 mg/dL   BUN 19 8 - 23 mg/dL   Creatinine, Ser 1.18 (H) 0.44 - 1.00 mg/dL   Calcium 8.1 (L) 8.9 - 10.3 mg/dL   GFR calc non Af Amer 42 (L) >60 mL/min   GFR calc Af Amer 48 (L) >60 mL/min   Anion gap 11 5 - 15    Comment: Performed at [facility], [address], [place], [place] [address]  Protime-INR     Status: Abnormal   Collection Time: 12/30/19 12:10 PM  Result Value Ref Range   Prothrombin Time 16.8 (H) 11.4 - 15.2 seconds   INR 1.4 (H) 0.8 - 1.2    Comment: (NOTE) INR goal varies based on device and disease states. Performed at [facility], [place] [address] [place],  [place] [address]   Aerobic/Anaerobic Culture (surgical/deep wound)     Status: None (Preliminary result)   Collection Time: 12/30/19  3:26 PM   Specimen: BILE  Result Value Ref Range   Specimen Description BILE    Special Requests NONE    Gram Stain      ABUNDANT WBC PRESENT, PREDOMINANTLY PMN NO ORGANISMS SEEN    Culture      CULTURE REINCUBATED FOR BETTER GROWTH Performed at [facility], [place] [address], [place], [place] [address]    Report Status PENDING   Glucose, capillary     Status: None   Collection Time: 12/30/19  4:23 PM  Result Value Ref Range   Glucose-Capillary 80 70 - 99 mg/dL  Glucose, capillary     Status: Abnormal   Collection Time: 12/30/19  7:53 PM  Result Value Ref Range   Glucose-Capillary 113 (H) 70 - 99 mg/dL  Glucose, capillary     Status: None   Collection Time: 12/30/19 11:34 PM  Result Value Ref Range   Glucose-Capillary 99 70 - 99 mg/dL  Glucose, capillary     Status: Abnormal   Collection Time: 12/31/19  3:57 AM  Result Value Ref  Range   Glucose-Capillary 107 (H) 70 - 99 mg/dL  Basic metabolic panel     Status: Abnormal   Collection Time: 12/31/19  6:13 AM  Result Value Ref Range   Sodium 140 135 - 145 mmol/L   Potassium 4.4 3.5 - 5.1 mmol/L   Chloride 112 (H) 98 - 111 mmol/L   CO2 21 (L) 22 - 32 mmol/L   Glucose, Bld 117 (H) 70 - 99 mg/dL   BUN 24 (H) 8 - 23 mg/dL   Creatinine, Ser 0.93 (H) 0.44 - 1.00 mg/dL   Calcium 8.2 (L) 8.9 - 10.3 mg/dL   GFR calc non Af Amer 49 (L) >60 mL/min   GFR calc Af Amer 56 (L) >60 mL/min   Anion gap 7 5 - 15    Comment: Performed at [facility], [address], [place], [place] [address]  CBC     Status: Abnormal   Collection Time: 12/31/19  6:13 AM  Result Value Ref Range   WBC 14.5 (H) 4.0 - 10.5 K/uL   RBC 3.63 (L) 3.87 - 5.11 MIL/uL   Hemoglobin 10.6 (L) 12.0 - 15.0 g/dL   HCT 93.7 (L) 16.9 - 67.8 %   MCV 91.2 80.0 - 100.0 fL   MCH 29.2 26.0 - 34.0 pg   MCHC 32.0 30.0 - 36.0 g/dL   RDW  93.8 10.1 - 75.1 %   Platelets 198 150 - 400 K/uL   nRBC 0.0 0.0 - 0.2 %    Comment: Performed at [facility], [address] [place], [place] [address]  Glucose, capillary     Status: None   Collection Time: 12/31/19  8:46 AM  Result Value Ref Range   Glucose-Capillary 97 70 - 99 mg/dL  Glucose, capillary     Status: Abnormal   Collection Time: 12/31/19 11:37 AM  Result Value Ref Range   Glucose-Capillary 122 (H) 70 - 99 mg/dL  Glucose, capillary     Status: None   Collection Time: 12/31/19  5:12 PM  Result Value Ref Range   Glucose-Capillary 96 70 - 99 mg/dL  Glucose, capillary     Status: Abnormal   Collection Time: 12/31/19  8:11 PM  Result Value Ref Range   Glucose-Capillary 144 (H) 70 - 99 mg/dL  Glucose, capillary     Status: Abnormal   Collection Time: 12/31/19 11:50 PM  Result Value Ref Range   Glucose-Capillary 121 (H) 70 - 99 mg/dL  Comprehensive metabolic panel     Status: Abnormal   Collection Time: 01/01/20  3:40 AM  Result Value Ref Range   Sodium 139 135 - 145 mmol/L   Potassium 3.7 3.5 - 5.1 mmol/L   Chloride 110 98 - 111 mmol/L   CO2 20 (L) 22 - 32 mmol/L   Glucose, Bld 123 (H) 70 - 99 mg/dL   BUN 30 (H) 8 - 23 mg/dL   Creatinine, Ser 2.77 0.44 - 1.00 mg/dL   Calcium 8.1 (L) 8.9 - 10.3 mg/dL   Total Protein 5.6 (L) 6.5 - 8.1 g/dL   Albumin 2.2 (L) 3.5 - 5.0 g/dL   AST 17 15 - 41 U/L   ALT 16 0 - 44 U/L   Alkaline Phosphatase 63 38 - 126 U/L   Total Bilirubin 0.8 0.3 - 1.2 mg/dL   GFR calc non Af Amer 59 (L) >60 mL/min   GFR calc Af Amer >60 >60 mL/min   Anion gap 9 5 - 15  Comment: Performed at [facility], [address], [place], [place] [address]  CBC     Status: Abnormal   Collection Time: 01/01/20  3:40 AM  Result Value Ref Range   WBC 9.0 4.0 - 10.5 K/uL   RBC 3.41 (L) 3.87 - 5.11 MIL/uL   Hemoglobin 10.2 (L) 12.0 - 15.0 g/dL   HCT 63.0 (L) 16.0 - 10.9 %   MCV 90.3 80.0 - 100.0 fL   MCH 29.9 26.0 - 34.0 pg   MCHC 33.1 30.0  - 36.0 g/dL   RDW 32.3 55.7 - 32.2 %   Platelets 202 150 - 400 K/uL   nRBC 0.0 0.0 - 0.2 %    Comment: Performed at [facility], [address], [place], [place] [address]  Glucose, capillary     Status: Abnormal   Collection Time: 01/01/20  4:00 AM  Result Value Ref Range   Glucose-Capillary 114 (H) 70 - 99 mg/dL  Glucose, capillary     Status: Abnormal   Collection Time: 01/01/20  7:19 AM  Result Value Ref Range   Glucose-Capillary 106 (H) 70 - 99 mg/dL    Radiology/Results: IR PATIENT EVAL TECH 0-60 MINS  Result Date: 12/30/2019 [place] Code     12/30/2019  2:44 PM Dr. Lowella Dandy [place] pt and determined that procedure would be safer to perform in CT  CT IMAGE GUIDED DRAINAGE BY PERCUTANEOUS CATHETER  Result Date: 12/30/2019 INDICATION: 86 year old with acute calculus cholecystitis. Patient needs a percutaneous cholecystostomy tube. EXAM: CT-GUIDED CHOLECYSTOSTOMY TUBE PLACEMENT MEDICATIONS: Moderate sedation ANESTHESIA/SEDATION: Moderate (conscious) sedation was employed during this procedure. A total of Versed 1.5 mg and Fentanyl 75 mcg was administered intravenously. Moderate Sedation Time: 25 minutes. The patient's level of consciousness and vital signs were monitored continuously by radiology nursing throughout the procedure under my direct supervision. FLUOROSCOPY TIME:  None COMPLICATIONS: None immediate. PROCEDURE: Informed written consent was obtained from the patient after a thorough discussion of the procedural risks, benefits and alternatives. All questions were addressed. The gallbladder was evaluated with ultrasound. Large distended abnormal gallbladder was identified but a large amount of surrounding bowel gas was also identified. Due to the overlying bowel gas, the procedure was performed with CT. Patient was placed supine on CT scanner and CT images were obtained. A timeout was performed prior to the initiation of the procedure. Right upper abdomen was prepped with  chlorhexidine and sterile field was created. Maximal barrier sterile technique was utilized including caps, mask, sterile gowns, sterile gloves, sterile drape, hand hygiene and skin antiseptic. Skin and soft tissues were anesthetized with 1% lidocaine. Using CT guidance, an 18 gauge trocar needle was directed into the gallbladder. Yellow bilious fluid was rapidly draining from the 18 gauge needle. Stiff Amplatz wire was advanced into the gallbladder and the tract was dilated to accommodate a 10.2 [place] multipurpose drain. 130 mL of yellowish bile was removed. There was some sediment within the aspirated bile. Fluid sample was sent for culture. Follow up CT images were obtained. Catheter was sutured to skin and attached to a gravity bag. FINDINGS: Markedly distended gallbladder with surrounding inflammatory changes. 10.2 French drain was successfully placed within the gallbladder. Gallbladder appears to be decompressed at the end of the procedure. IMPRESSION: CT-guided cholecystostomy tube placement. Electronically Signed   By: Richarda Overlie M.D.   On: 12/30/2019 17:13    Anti-infectives: Anti-infectives (From admission, onward)   Start     Dose/Rate Route Frequency Ordered Stop  12/29/19 1400  piperacillin-tazobactam (ZOSYN) IVPB 3.375 g     3.375 g 12.5 mL/hr over 240 Minutes Intravenous Every 8 hours 12/29/19 1136     12/29/19 1200  cefTRIAXone (ROCEPHIN) 2 g in sodium chloride 0.9 % 100 mL IVPB  Status:  Discontinued     2 g 200 mL/hr over 30 Minutes Intravenous Every 24 hours 12/29/19 0623 12/29/19 1136   12/29/19 1000  metroNIDAZOLE (FLAGYL) IVPB 500 mg  Status:  Discontinued     500 mg 100 mL/hr over 60 Minutes Intravenous Every 8 hours 12/29/19 0623 12/29/19 1136   12/29/19 0415  metroNIDAZOLE (FLAGYL) IVPB 500 mg     500 mg 100 mL/hr over 60 Minutes Intravenous  Once 12/29/19 0401 12/29/19 0510   12/29/19 0245  cefTRIAXone (ROCEPHIN) 1 g in sodium chloride 0.9 % 100 mL IVPB     1 g 200  mL/hr over 30 Minutes Intravenous  Once 12/29/19 0231 12/29/19 0346      Assessment/Plan: Problem List: Patient Active Problem List   Diagnosis Date Noted  . Acute cholecystitis 12/29/2019  . UTI (urinary tract infection) 12/29/2019  . Adrenal nodule (HCC) 12/29/2019  . Abdominal aortic ectasia (HCC) 12/29/2019  . Sepsis (HCC) 12/29/2019  . Major depression 03/25/2018  . Cat bite of right hand 12/21/2016  . BPPV (benign paroxysmal positional vertigo) 07/12/2016  . Memory loss 04/11/2016  . Mallet toe of right foot 10/20/2015  . Renal artery stenosis (HCC) 09/27/2015  . Hyperlipidemia 06/30/2015  . Claudication (HCC) 05/04/2015  . Atherosclerotic PVD with intermittent claudication (HCC) 04/26/2015  . Tinnitus 12/31/2014  . Former smoker 09/29/2014  . DNR (do not resuscitate) 09/29/2014  . Insulin resistance 07/19/2014  . Multinodular goiter 08/30/2013  . Spinal stenosis of lumbar region at multiple levels 09/29/2012  . HIP PAIN, BILATERAL 07/16/2010  . COPD (chronic obstructive pulmonary disease) (HCC) 09/20/2009  . CONSTIPATION, CHRONIC 09/20/2009  . Iron deficiency anemia 11/09/2007  . History of UTI 11/09/2007  . RESTLESS LEG SYNDROME 09/25/2007  . Essential hypertension 09/25/2007  . GERD 09/25/2007  . LOW BACK PAIN 09/25/2007  . Osteoporosis 09/25/2007    Will give dulcolax suppository * No surgery found *    LOS: 3 days   Matt B. Daphine Deutscher, MD, [facility] [phone] beeper [phone]  01/01/2020 9:03 AM

## 2020-01-01 NOTE — Progress Notes (Addendum)
PROGRESS NOTE   Cheryl Atkinson  ZOX:[id number]    DOB: 08-Nov-1931    DOA: 12/28/2019  PCP: Shelva Majestic, MD   I have briefly reviewed patients previous medical records in [facility]  Chief Complaint:   Chief Complaint  Patient presents with  . Abdominal Pain    Brief Narrative:  86 year old female, lives with her son and ambulates with the help of a cane or walker, PMH of anemia, chronic low back pain, constipation, GERD, hiatal hernia, hypertension, PAD, hyperlipidemia, prediabetes, presented to the ED on 1/27 due to right lower quadrant abdominal pain and nausea.  Clinical picture and imaging consistent with acute cholecystitis.   Assessment & Plan:  Principal Problem:   Acute cholecystitis Active Problems:   Atherosclerotic PVD with intermittent claudication (HCC)   UTI (urinary tract infection)   Adrenal nodule (HCC)   Abdominal aortic ectasia (HCC)   Acute cholecystitis: On admission patient was febrile at 100.6 F.  She was not tachycardic, hypotensive or hypoxic.  Did not meet sepsis criteria on admission.  WBC 14.2 and lactate 2.1.  Lipase and LFTs normal.  CT abdomen 1/27: Markedly hydropic gallbladder with extensive pericholecystic inflammation and marked intra and extrahepatic biliary ductal dilatation to the level of the pancreatic head.  Findings compatible with acute cholecystitis.  No visible gallstones.  Treated conservatively with bowel rest/NPO, IV fluids, IV ceftriaxone and metronidazole, pain management.  General surgery was consulted, got MRCP which confirmed acute cholecystitis.  Probable calculus lodged within the neck of the gallbladder.  Dilatation of the common hepatic duct and common bile duct without obstructing lesion.  No evidence of pancreatitis.  CCS consulted IR who did CT-guided percutaneous cholecystostomy on 1/28 because patient is a poor surgical candidate.  Continue drain management per IR.  Improved.  Biliary culture reintubated for  better growth.  Acute cystitis: CT abdomen 1/27 had findings suggestive of cystitis.  Urine microscopy showed many bacteria and pyuria.  Unfortunately it appears that urine culture was never sent.  Indeterminate 1.4 cm left adrenal nodule: Radiology recommends further characterization with adrenal mass protocol CT or MRI which can be done as outpatient once acute issues have resolved.  Infrarenal abdominal aortic focal fusiform ectasia 2.5 cm diameter: As per radiology, at risk for aneurysmal development and recommend follow-up by ultrasound in 5 years.  Essential hypertension: Controlled.  Atherosclerotic PAD with intermittent claudication: Followed by Dr. Allyson Sabal with Cardiology.  Aspirin and Plavix had been held for procedures, completed.  After discussing with Dr. Daphine Deutscher, CCS on 1/30, resumed aspirin and Plavix.  Resume statins.  Prediabetes: A1c 6.1 in 10/2019.  Takes Metformin at home, currently on hold.  Well-controlled on NovoLog SSI.  Hypokalemia: Replaced.  Acute kidney injury: Mild.  Resolved.  Anemia, suspect of chronic disease: Stable.  Constipation: Initiated bowel regimen.  Paroxysmal atrial fibrillation: Not sure if this is old or new for her.  Controlled ventricular rate at this time.  Get EKG.  Monitor on telemetry.  Continue aspirin and Plavix for now.  May not be good candidate for anticoagulation but consider discussing with her cardiologist.  Body mass index is 23.24 kg/m.   DVT prophylaxis: SCDs Code Status: DNR Family Communication: None at bedside.  I called and discussed in detail with patient's son, updated care and answered questions.  He plans to speak with patient's mother regarding disposition i.e. home versus SNF. Disposition:  . Patient came from: Home           .  Anticipated d/c place: To be determined.  Therapies recommend SNF but patient reluctant and indicates that she will have family/friends support at home 24/7 and would prefer to go home.   However willing to consider SNF. Marland Kitchen Barriers to d/c: Pending further clinical improvement and therapies decision.   Consultants:   General surgery Interventional radiology  Procedures:   CT-guided percutaneous cholecystostomy 1/28  Antimicrobials:   IV ceftriaxone x1 dose 1/26 IVF metronidazole 1/26-1/27 IV Zosyn 1/27 >   Subjective:  Patient reports some pain at the site of drain.  Also abdominal cramping, some distention, feels like she will do well with a BM which she has not had for 3 days, reports taking MiraLAX and Dulcolax at home which do well for her.  Tolerated diet well.  No nausea or vomiting.  Objective:   Vitals:   12/31/19 0356 12/31/19 1547 12/31/19 2013 01/01/20 0402  BP: (!) 123/57 128/74 122/66 126/72  Pulse: 82 81 60 64  Resp: 18 18 17 17   Temp: 98.6 F (37 C) 98.6 F (37 C) 98.6 F (37 C) 98.2 F (36.8 C)  TempSrc: Oral Oral Oral Oral  SpO2: 97% 100% 95% 97%  Weight:      Height:        General exam: Elderly female, moderately built and nourished sitting up comfortably in bed without distress. Respiratory system: Clear to auscultation. Respiratory effort normal. Cardiovascular system: S1 & S2 heard, RRR. No JVD, murmurs, rubs, gallops or clicks. No pedal edema. Gastrointestinal system: Abdomen minimally distended but soft and without tenderness.  RUQ drain with minimal drainage.  No organomegaly or masses appreciated.  Normal bowel sounds heard. Central nervous system: Alert and oriented. No focal neurological deficits. Extremities: Symmetric 5 x 5 power. Skin: No rashes, lesions or ulcers Psychiatry: Judgement and insight appear normal. Mood & affect appropriate.     Data Reviewed:   I have personally reviewed following labs and imaging studies   CBC: Recent Labs  Lab 12/30/19 0235 12/31/19 0613 01/01/20 0340  WBC 21.5* 14.5* 9.0  HGB 11.2* 10.6* 10.2*  HCT 34.4* 33.1* 30.8*  MCV 91.2 91.2 90.3  PLT 212 198 202    Basic Metabolic  Panel: Recent Labs  Lab 12/30/19 1210 12/31/19 0613 01/01/20 0340  NA 139 140 139  K 3.4* 4.4 3.7  CL 106 112* 110  CO2 22 21* 20*  GLUCOSE 101* 117* 123*  BUN 19 24* 30*  CREATININE 1.18* 1.04* 0.88  CALCIUM 8.1* 8.2* 8.1*    Liver Function Tests: Recent Labs  Lab 12/28/19 2330 12/30/19 0235 01/01/20 0340  AST 22 20 17   ALT 16 16 16   ALKPHOS 97 83 63  BILITOT 0.9 1.0 0.8  PROT 7.2 6.0* 5.6*  ALBUMIN 3.7 2.6* 2.2*    CBG: Recent Labs  Lab 01/01/20 0400 01/01/20 0719 01/01/20 1140  GLUCAP 114* 106* 117*    Microbiology Studies:   Recent Results (from the past 240 hour(s))  Culture, blood (Routine X 2) w Reflex to ID Panel     Status: None (Preliminary result)   Collection Time: 12/29/19  4:27 AM   Specimen: BLOOD LEFT FOREARM  Result Value Ref Range Status   Specimen Description BLOOD LEFT FOREARM  Final   Special Requests   Final    BOTTLES DRAWN AEROBIC AND ANAEROBIC Blood Culture results may not be optimal due to an inadequate volume of blood received in culture bottles   Culture   Final    NO GROWTH 3  DAYS Performed at [facility], [address] [place], [place] [address]    Report Status PENDING  Incomplete  Respiratory Panel by RT PCR (Flu A&B, Covid) - Nasopharyngeal Swab     Status: None   Collection Time: 12/29/19  5:04 AM   Specimen: Nasopharyngeal Swab  Result Value Ref Range Status   SARS Coronavirus 2 by RT PCR NEGATIVE NEGATIVE Final    Comment: (NOTE) SARS-CoV-2 target nucleic acids are NOT DETECTED. The SARS-CoV-2 RNA is generally detectable in upper respiratoy specimens during the acute phase of infection. The lowest concentration of SARS-CoV-2 viral copies this assay can detect is 131 copies/mL. A negative result does not preclude SARS-Cov-2 infection and should not be used as the sole basis for treatment or other patient management decisions. A negative result may occur with  improper specimen collection/handling,  submission of specimen other than nasopharyngeal swab, presence of viral mutation(s) within the areas targeted by this assay, and inadequate number of viral copies (<131 copies/mL). A negative result must be combined with clinical observations, patient history, and epidemiological information. The expected result is Negative. Fact Sheet for Patients:  https://www.moore.com/ Fact Sheet for Healthcare Providers:  https://www.young.biz/ This test is not yet ap proved or cleared by the [place] FDA and  has been authorized for detection and/or diagnosis of SARS-CoV-2 by FDA under an Emergency Use Authorization (EUA). This EUA will remain  in effect (meaning this test can be used) for the duration of the COVID-19 declaration under Section 564(b)(1) of the Act, 21 U.S.C. section 360bbb-3(b)(1), unless the authorization is terminated or revoked sooner.    Influenza A by PCR NEGATIVE NEGATIVE Final   Influenza B by PCR NEGATIVE NEGATIVE Final    Comment: (NOTE) The Xpert Xpress SARS-CoV-2/FLU/RSV assay is intended as an aid in  the diagnosis of influenza from Nasopharyngeal swab specimens and  should not be used as a sole basis for treatment. Nasal washings and  aspirates are unacceptable for Xpert Xpress SARS-CoV-2/FLU/RSV  testing. Fact Sheet for Patients: https://www.moore.com/ Fact Sheet for Healthcare Providers: https://www.young.biz/ This test is not yet approved or cleared by the [place] FDA and  has been authorized for detection and/or diagnosis of SARS-CoV-2 by  FDA under an Emergency Use Authorization (EUA). This EUA will remain  in effect (meaning this test can be used) for the duration of the  Covid-19 declaration under Section 564(b)(1) of the Act, 21  U.S.C. section 360bbb-3(b)(1), unless the authorization is  terminated or revoked. Performed at [facility], [address]  [place], [place] [address]   Culture, blood (Routine X 2) w Reflex to ID Panel     Status: None (Preliminary result)   Collection Time: 12/29/19  7:15 AM   Specimen: BLOOD  Result Value Ref Range Status   Specimen Description BLOOD LEFT ANTECUBITAL  Final   Special Requests   Final    BOTTLES DRAWN AEROBIC AND ANAEROBIC Blood Culture results may not be optimal due to an excessive volume of blood received in culture bottles   Culture   Final    NO GROWTH 3 DAYS Performed at [facility], [address], [place], [place] [address]    Report Status PENDING  Incomplete  Aerobic/Anaerobic Culture (surgical/deep wound)     Status: None (Preliminary result)   Collection Time: 12/30/19  3:26 PM   Specimen: BILE  Result Value Ref Range Status   Specimen Description BILE  Final   Special Requests NONE  Final   Gram Stain   Final    ABUNDANT WBC PRESENT, PREDOMINANTLY PMN NO ORGANISMS SEEN    Culture   Final    CULTURE REINCUBATED FOR BETTER GROWTH Performed at [facility], [place] [address] [place], [place] [address]    Report Status PENDING  Incomplete     Radiology Studies:  No results found.   Scheduled Meds:   . aspirin EC  81 mg Oral Daily  . atorvastatin  40 mg Oral Daily  . [START ON 01/02/2020] clopidogrel  75 mg Oral Q breakfast  . insulin aspart  0-6 Units Subcutaneous Q4H  . polyethylene glycol  17 g Oral Daily  . Rotigotine  3 mg Transdermal QHS  . sodium chloride flush  5 mL Intracatheter Q8H    Continuous Infusions:   . piperacillin-tazobactam (ZOSYN)  IV 3.375 g (01/01/20 0805)     LOS: 3 days     Vernell Leep, MD, [place], [facility]. [facility]    To contact the attending provider between 7A-7P or the covering provider during after hours 7P-7A, please log into the web site [email or web address] and access using universal [place] password for that web site. If you do not have the password, please call the hospital operator.  01/01/2020, 2:27 PM

## 2020-01-01 NOTE — Evaluation (Signed)
Occupational Therapy Evaluation Patient Details Name: Cheryl Atkinson MRN: [id number] DOB: 02/22/1931 Today's Date: 01/01/2020    History of Present Illness Patient is a 86 y/o female who presents with abdominal pain now due to acute cholecystitis s/p cholecystectomy drain placement 1/28. PMH includes PAD, HTN.   Clinical Impression   Pt with decline in function and safety with ADLs and ADL mobility with impaired strength, balance ane endurance. Pt with Poor sitting balance. Pt lives at home with her son and was independent with ADLs/selfcare, light home mgt and used a RW for mobility. Pt currently requires max A with bed mobility, has Poor static/dynamic sitting balance, mod A with UB ADLs, max - total A with LB ADLs, total A with toileting and max - mod A with mobility using RW for SPTs.  Pt would benefit from acute OT services to address impairments to maximize level of function and safety    Follow Up Recommendations  SNF    Equipment Recommendations  Other (comment)(TBD at next venue of care)    Recommendations for Other Services       Precautions / Restrictions Precautions Precautions: Fall Precaution Comments: drain Restrictions Weight Bearing Restrictions: No      Mobility Bed Mobility Overal bed mobility: Needs Assistance Bed Mobility: Rolling;Sidelying to Sit Rolling: Max assist Sidelying to sit: HOB elevated;Max assist       General bed mobility comments: cues and assist to bring LEs to EOB and to elevate trunk. pt required cues to use rail. Bed pads used to scoot hips forward  Transfers Overall transfer level: Needs assistance Equipment used: Rolling walker (2 wheeled) Transfers: Sit to/from Stand Sit to Stand: Max assist;Mod assist;From elevated surface         General transfer comment: 2 attempts to stand at RW, cues for hand placement, able to SPT to recliner with max A to control RW    Balance Overall balance assessment: Needs  assistance Sitting-balance support: Feet supported;Bilateral upper extremity supported Sitting balance-Leahy Scale: Poor Sitting balance - Comments: Requires UE support, Poor static and dynamic balance, posterior lean Postural control: Posterior lean Standing balance support: During functional activity;Bilateral upper extremity supported Standing balance-Leahy Scale: Poor                             ADL either performed or assessed with clinical judgement   ADL Overall ADL's : Needs assistance/impaired Eating/Feeding: Set up;Independent;Sitting Eating/Feeding Details (indicate cue type and reason): sitting in recliner with UB supported Grooming: Wash/dry hands;Wash/dry face;Minimal assistance;Sitting Grooming Details (indicate cue type and reason): Poor sitting balance at EOB Upper Body Bathing: Moderate assistance   Lower Body Bathing: Total assistance   Upper Body Dressing : Moderate assistance   Lower Body Dressing: Total assistance   Toilet Transfer: Maximal assistance;Moderate assistance;RW;Stand-pivot;Cueing for safety;Cueing for sequencing   Toileting- Clothing Manipulation and Hygiene: Total assistance       Functional mobility during ADLs: Maximal assistance;Moderate assistance;Cueing for safety;Cueing for sequencing       Vision Baseline Vision/History: Wears glasses Patient Visual Report: No change from baseline       Perception     Praxis      Pertinent Vitals/Pain Pain Assessment: Faces Faces Pain Scale: Hurts even more Pain Location: Abdomen Pain Descriptors / Indicators: Sore Pain Intervention(s): Limited activity within patient's tolerance;Monitored during session;Repositioned     Hand Dominance Right   Extremity/Trunk Assessment Upper Extremity Assessment Upper Extremity Assessment: Generalized weakness  Lower Extremity Assessment Lower Extremity Assessment: Defer to PT evaluation   Cervical / Trunk Assessment Cervical / Trunk  Assessment: Kyphotic   Communication Communication Communication: No difficulties   Cognition Arousal/Alertness: Awake/alert Behavior During Therapy: WFL for tasks assessed/performed Overall Cognitive Status: Within Functional Limits for tasks assessed                                     General Comments       Exercises     Shoulder Instructions      Home Living Family/patient expects to be discharged to:: Private residence Living Arrangements: Children Available Help at Discharge: Family;Available PRN/intermittently Type of Home: House Home Access: Stairs to enter     Home Layout: One level     Bathroom Shower/Tub: Producer, television/film/video: Handicapped height     Home Equipment: Environmental consultant - 4 wheels;Cane - single point   Additional Comments: inquiring to see if her housekeeper could come stay with her at d/c      Prior Functioning/Environment Level of Independence: Independent with assistive device(s)        Comments: Uses rollator for ambulation. Does own ADLs, cooks/cleans. Has a housekeeper that comes every other week.        OT Problem List: Decreased strength;Impaired balance (sitting and/or standing);Pain;Decreased activity tolerance;Decreased knowledge of use of DME or AE      OT Treatment/Interventions: Self-care/ADL training;DME and/or AE instruction;Therapeutic activities;Balance training;Therapeutic exercise;Patient/family education    OT Goals(Current goals can be found in the care plan section) Acute Rehab OT Goals Patient Stated Goal: to go home OT Goal Formulation: With patient Time For Goal Achievement: 01/15/20 Potential to Achieve Goals: Good ADL Goals Pt Will Perform Grooming: with min guard assist;sitting Pt Will Perform Upper Body Bathing: with min assist;sitting Pt Will Perform Lower Body Bathing: with max assist;with mod assist;sitting/lateral leans Pt Will Perform Upper Body Dressing: with min  assist;sitting Pt Will Transfer to Toilet: with mod assist;stand pivot transfer;bedside commode Additional ADL Goal #1: Pt will complete bed mobility mod - min A to sit EOB for grooming and ADL tasks Additional ADL Goal #2: Pt will sit EOB x2-3 minutes with mod - min A for supoport/balance to improve sitting balance for functional tasks  OT Frequency: Min 2X/week   Barriers to D/C: Decreased caregiver support          Co-evaluation              AM-PAC OT "6 Clicks" Daily Activity     Outcome Measure Help from another person eating meals?: None Help from another person taking care of personal grooming?: A Little Help from another person toileting, which includes using toliet, bedpan, or urinal?: Total Help from another person bathing (including washing, rinsing, drying)?: Total Help from another person to put on and taking off regular upper body clothing?: A Lot Help from another person to put on and taking off regular lower body clothing?: Total 6 Click Score: 12   End of Session Equipment Utilized During Treatment: Gait belt;Rolling walker Nurse Communication: Mobility status  Activity Tolerance: Patient limited by fatigue Patient left: in chair;with call bell/phone within reach;with chair alarm set  OT Visit Diagnosis: Unsteadiness on feet (R26.81);Other abnormalities of gait and mobility (R26.89);Muscle weakness (generalized) (M62.81);Pain Pain - part of body: (abdomen)                Time: 2683-4196 OT  Time Calculation (min): 25 min Charges:  OT General Charges $OT Visit: 1 Visit OT Evaluation $OT Eval Moderate Complexity: 1 Mod OT Treatments $Self Care/Home Management : 8-22 mins    Margaretmary Eddy [facility] 01/01/2020, 11:27 AM

## 2020-01-02 LAB — CBC
HCT: 32.3 % — ABNORMAL LOW (ref 36.0–46.0)
Hemoglobin: 10.3 g/dL — ABNORMAL LOW (ref 12.0–15.0)
MCH: 29.2 pg (ref 26.0–34.0)
MCHC: 31.9 g/dL (ref 30.0–36.0)
MCV: 91.5 fL (ref 80.0–100.0)
Platelets: 219 10*3/uL (ref 150–400)
RBC: 3.53 MIL/uL — ABNORMAL LOW (ref 3.87–5.11)
RDW: 13.6 % (ref 11.5–15.5)
WBC: 7.6 10*3/uL (ref 4.0–10.5)
nRBC: 0 % (ref 0.0–0.2)

## 2020-01-02 LAB — COMPREHENSIVE METABOLIC PANEL
ALT: 26 U/L (ref 0–44)
AST: 33 U/L (ref 15–41)
Albumin: 2.4 g/dL — ABNORMAL LOW (ref 3.5–5.0)
Alkaline Phosphatase: 54 U/L (ref 38–126)
Anion gap: 11 (ref 5–15)
BUN: 20 mg/dL (ref 8–23)
CO2: 18 mmol/L — ABNORMAL LOW (ref 22–32)
Calcium: 8.4 mg/dL — ABNORMAL LOW (ref 8.9–10.3)
Chloride: 111 mmol/L (ref 98–111)
Creatinine, Ser: 0.67 mg/dL (ref 0.44–1.00)
GFR calc Af Amer: >60 mL/min (ref >60)
GFR calc non Af Amer: >60 mL/min (ref >60)
Glucose, Bld: 119 mg/dL — ABNORMAL HIGH (ref 70–99)
Potassium: 3.8 mmol/L (ref 3.5–5.1)
Sodium: 140 mmol/L (ref 135–145)
Total Bilirubin: 0.5 mg/dL (ref 0.3–1.2)
Total Protein: 5.7 g/dL — ABNORMAL LOW (ref 6.5–8.1)

## 2020-01-02 LAB — GLUCOSE, CAPILLARY
Glucose-Capillary: 109 mg/dL — ABNORMAL HIGH (ref 70–99)
Glucose-Capillary: 95 mg/dL (ref 70–99)
Glucose-Capillary: 114 mg/dL — ABNORMAL HIGH (ref 70–99)
Glucose-Capillary: 130 mg/dL — ABNORMAL HIGH (ref 70–99)

## 2020-01-02 LAB — SARS CORONAVIRUS 2 (TAT 6-24 HRS): SARS Coronavirus 2: NEGATIVE

## 2020-01-02 MED ORDER — CEPHALEXIN 500 MG PO CAPS
500.0000 mg | ORAL_CAPSULE | Freq: Four times a day (QID) | ORAL | Status: DC
  Administered 2020-01-02 – 2020-01-03 (×4): 500 mg via ORAL
  Filled 2020-01-02 (×4): qty 1

## 2020-01-02 MED ORDER — POLYETHYLENE GLYCOL 3350 17 G PO PACK
17.0000 g | PACK | Freq: Two times a day (BID) | ORAL | Status: DC
  Administered 2020-01-02 – 2020-01-03 (×2): 17 g via ORAL
  Filled 2020-01-02 (×2): qty 1

## 2020-01-02 MED ORDER — AMOXICILLIN-POT CLAVULANATE 875-125 MG PO TABS: 1.0000 | ORAL_TABLET | Freq: Two times a day (BID) | ORAL | Status: DC

## 2020-01-02 MED ORDER — BISACODYL 10 MG RE SUPP
10.0000 mg | Freq: Once | RECTAL | Status: AC
  Administered 2020-01-02: 11:00:00 10 mg via RECTAL
  Filled 2020-01-02: qty 1

## 2020-01-02 NOTE — Progress Notes (Signed)
Referring Physician(s): Dr Waymon Amato Dr Sheron Nightingale  Supervising Physician: Simonne Come  Patient Status:  [facility] - In-pt  Chief Complaint:  Perc cholecystostomy drain 12/30/19  Subjective:  Comfortable Less pain Feels weak-- wants to walk  Drain intact  Allergies: Codeine  Medications: Prior to Admission medications   Medication Sig Start Date End Date Taking? Authorizing Provider  Albuterol Sulfate (PROAIR RESPICLICK) 108 (90 Base) MCG/ACT AEPB Inhale 2 puffs into the lungs every 6 (six) hours as needed (shortness of breath from COPD). 12/29/18  Yes Shelva Majestic, MD  aspirin EC 81 MG EC tablet Take 1 tablet (81 mg total) by mouth daily. 05/05/15  Yes Kilroy, Luke K, PA-C  atorvastatin (LIPITOR) 40 MG tablet Take 1 tablet (40 mg total) by mouth daily. 10/07/19  Yes Shelva Majestic, MD  clopidogrel (PLAVIX) 75 MG tablet Take 1 tablet (75 mg total) by mouth daily with breakfast. MUST KEEP APPOINTMENT 07/20/19 WITH DR Allyson Sabal FOR FUTURE REFILLS 06/11/19  Yes Runell Gess, MD  metFORMIN (GLUCOPHAGE) 500 MG tablet TAKE 1 (1) TABLET BY MOUTH DAILY Patient taking differently: Take 500 mg by mouth daily with breakfast.  10/05/19  Yes Shelva Majestic, MD  Multiple Vitamins-Minerals (PRESERVISION AREDS 2 PO) Take 1 tablet by mouth 2 (two) times daily.   Yes [provider]  Olmesartan-amLODIPine-HCTZ (TRIBENZOR) 40-10-12.5 MG TABS Take 1 tablet by mouth every morning. Patient taking differently: Take 1 tablet by mouth daily.  10/05/19  Yes Shelva Majestic, MD  polyethylene glycol [facility] Ethelene Hal) packet Take 17 g by mouth at bedtime as needed for mild constipation. Reported on 03/14/2016   Yes [provider]  Rotigotine 3 MG/24HR PT24 Place 3 mg onto the skin at bedtime. 03/31/19  Yes Shelva Majestic, MD     Vital Signs: BP 129/60 (BP Location: Right Arm)   Pulse 62   Temp 98.8 F (37.1 C) (Oral)   Resp 16   Ht 5\' 6"  (1.676 m)   Wt 143 lb 15.4 oz (65.3  kg)   LMP  (LMP Unknown)   SpO2 98%   BMI 23.24 kg/m   Physical Exam Vitals reviewed.  Abdominal:     Tenderness: There is no abdominal tenderness.  Skin:    General: Skin is warm and dry.     Comments: Site is clean and dry NT No bleeding OP scant in bag 150 cc yesterday Dark bile OP Flushes easily  No organisms  Neurological:     Mental Status: She is alert.     Imaging: MR 3D Recon At Scanner  Result Date: 12/30/2019 CLINICAL DATA:  RIGHT upper quadrant pain. Cholecystitis suspected on CT 1 day prior. EXAM: MRI ABDOMEN WITHOUT AND WITH CONTRAST (INCLUDING MRCP) TECHNIQUE: Multiplanar multisequence MR imaging of the abdomen was performed both before and after the administration of intravenous contrast. Heavily T2-weighted images of the biliary and pancreatic ducts were obtained, and three-dimensional MRCP images were rendered by post processing. CONTRAST:  6.69mL GADAVIST GADOBUTROL 1 MMOL/ML IV SOLN COMPARISON:  CT 12/29/2019 FINDINGS: Lower chest: Small bilateral pleural effusions. Hepatobiliary: The gallbladder is markedly distended to 5.7 cm. There is extensive gallbladder wall thickening and pericholecystic fluid. Multiple gallstones layer dependently within the fundus of the gallbladder. Within the neck of the bladder, there is an obstructing calculus measuring approximately 5 mm on image 16/7. This potential calculus is also seen on image 33/13 of the MRCP series. While the common bile duct is dilated to 10  mm, there is no discrete filling defect within the common bile duct. The ductal dilatation extends the level of the ampulla. There is mild intrahepatic duct dilatation involving the LEFT hepatic lobe. No focal hepatic lesion. Postcontrast imaging is severely degraded by patient respiratory motion. Pancreas: Pancreatic duct is normal caliber. No pancreatic inflammation identified Spleen: Normal spleen Adrenals/urinary tract: Adrenal glands and kidneys are normal. The ureters  and bladder normal. Stomach/Bowel: Stomach, small bowel, appendix, and cecum are normal. The colon and rectosigmoid colon are normal. Vascular/Lymphatic: Abdominal aorta is normal caliber. No periportal or retroperitoneal adenopathy. No pelvic adenopathy. Reproductive: Other: No free fluid. Musculoskeletal: No aggressive osseous lesion. IMPRESSION: 1. Acute cholecystitis. Probable calculus lodged within the neck of the gallbladder. 2. Dilatation of the common hepatic duct and common bile duct without obstructing lesion identified. 3. Minimal intrahepatic duct dilatation. 4. No pancreatic duct dilatation.  No evidence of pancreatitis. Electronically Signed   By: Suzy Bouchard M.D.   On: 12/30/2019 07:53   MR ABDOMEN MRCP W WO CONTAST  Result Date: 12/30/2019 CLINICAL DATA:  RIGHT upper quadrant pain. Cholecystitis suspected on CT 1 day prior. EXAM: MRI ABDOMEN WITHOUT AND WITH CONTRAST (INCLUDING MRCP) TECHNIQUE: Multiplanar multisequence MR imaging of the abdomen was performed both before and after the administration of intravenous contrast. Heavily T2-weighted images of the biliary and pancreatic ducts were obtained, and three-dimensional MRCP images were rendered by post processing. CONTRAST:  6.2mL GADAVIST GADOBUTROL 1 MMOL/ML IV SOLN COMPARISON:  CT 12/29/2019 FINDINGS: Lower chest: Small bilateral pleural effusions. Hepatobiliary: The gallbladder is markedly distended to 5.7 cm. There is extensive gallbladder wall thickening and pericholecystic fluid. Multiple gallstones layer dependently within the fundus of the gallbladder. Within the neck of the bladder, there is an obstructing calculus measuring approximately 5 mm on image 16/7. This potential calculus is also seen on image 33/13 of the MRCP series. While the common bile duct is dilated to 10 mm, there is no discrete filling defect within the common bile duct. The ductal dilatation extends the level of the ampulla. There is mild intrahepatic duct  dilatation involving the LEFT hepatic lobe. No focal hepatic lesion. Postcontrast imaging is severely degraded by patient respiratory motion. Pancreas: Pancreatic duct is normal caliber. No pancreatic inflammation identified Spleen: Normal spleen Adrenals/urinary tract: Adrenal glands and kidneys are normal. The ureters and bladder normal. Stomach/Bowel: Stomach, small bowel, appendix, and cecum are normal. The colon and rectosigmoid colon are normal. Vascular/Lymphatic: Abdominal aorta is normal caliber. No periportal or retroperitoneal adenopathy. No pelvic adenopathy. Reproductive: Other: No free fluid. Musculoskeletal: No aggressive osseous lesion. IMPRESSION: 1. Acute cholecystitis. Probable calculus lodged within the neck of the gallbladder. 2. Dilatation of the common hepatic duct and common bile duct without obstructing lesion identified. 3. Minimal intrahepatic duct dilatation. 4. No pancreatic duct dilatation.  No evidence of pancreatitis. Electronically Signed   By: Suzy Bouchard M.D.   On: 12/30/2019 07:53   IR PATIENT EVAL TECH 0-60 MINS  Result Date: 12/30/2019 Araceli Bouche     12/30/2019  2:44 PM Dr. Anselm Pancoast [place] pt and determined that procedure would be safer to perform in CT  CT IMAGE GUIDED DRAINAGE BY PERCUTANEOUS CATHETER  Result Date: 12/30/2019 INDICATION: 86 year old with acute calculus cholecystitis. Patient needs a percutaneous cholecystostomy tube. EXAM: CT-GUIDED CHOLECYSTOSTOMY TUBE PLACEMENT MEDICATIONS: Moderate sedation ANESTHESIA/SEDATION: Moderate (conscious) sedation was employed during this procedure. A total of Versed 1.5 mg and Fentanyl 75 mcg was administered intravenously. Moderate Sedation Time: 25 minutes. The patient's level  of consciousness and vital signs were monitored continuously by radiology nursing throughout the procedure under my direct supervision. FLUOROSCOPY TIME:  None COMPLICATIONS: None immediate. PROCEDURE: Informed written consent was obtained from  the patient after a thorough discussion of the procedural risks, benefits and alternatives. All questions were addressed. The gallbladder was evaluated with ultrasound. Large distended abnormal gallbladder was identified but a large amount of surrounding bowel gas was also identified. Due to the overlying bowel gas, the procedure was performed with CT. Patient was placed supine on CT scanner and CT images were obtained. A timeout was performed prior to the initiation of the procedure. Right upper abdomen was prepped with chlorhexidine and sterile field was created. Maximal barrier sterile technique was utilized including caps, mask, sterile gowns, sterile gloves, sterile drape, hand hygiene and skin antiseptic. Skin and soft tissues were anesthetized with 1% lidocaine. Using CT guidance, an 18 gauge trocar needle was directed into the gallbladder. Yellow bilious fluid was rapidly draining from the 18 gauge needle. Stiff Amplatz wire was advanced into the gallbladder and the tract was dilated to accommodate a 10.2 [place] multipurpose drain. 130 mL of yellowish bile was removed. There was some sediment within the aspirated bile. Fluid sample was sent for culture. Follow up CT images were obtained. Catheter was sutured to skin and attached to a gravity bag. FINDINGS: Markedly distended gallbladder with surrounding inflammatory changes. 10.2 French drain was successfully placed within the gallbladder. Gallbladder appears to be decompressed at the end of the procedure. IMPRESSION: CT-guided cholecystostomy tube placement. Electronically Signed   By: Richarda Overlie M.D.   On: 12/30/2019 17:13    Labs:  CBC: Recent Labs    12/30/19 0235 12/31/19 0613 01/01/20 0340 01/02/20 0413  WBC 21.5* 14.5* 9.0 7.6  HGB 11.2* 10.6* 10.2* 10.3*  HCT 34.4* 33.1* 30.8* 32.3*  PLT 212 198 202 219    COAGS: Recent Labs    12/30/19 1210  INR 1.4*    BMP: Recent Labs    12/30/19 1210 12/31/19 0613 01/01/20 0340  01/02/20 0413  NA 139 140 139 140  K 3.4* 4.4 3.7 3.8  CL 106 112* 110 111  CO2 22 21* 20* 18*  GLUCOSE 101* 117* 123* 119*  BUN 19 24* 30* 20  CALCIUM 8.1* 8.2* 8.1* 8.4*  CREATININE 1.18* 1.04* 0.88 0.67  GFRNONAA 42* 49* 59* >60  GFRAA 48* 56* >60 >60    LIVER FUNCTION TESTS: Recent Labs    12/28/19 2330 12/30/19 0235 01/01/20 0340 01/02/20 0413  BILITOT 0.9 1.0 0.8 0.5  AST 22 20 17  33  ALT 16 16 16 26   ALKPHOS 97 83 63 54  PROT 7.2 6.0* 5.6* 5.7*  ALBUMIN 3.7 2.6* 2.2* 2.4*    Assessment and Plan:  Perc chole drain intact No issues Will need to remain 8 weeks-- follow with [facility] I will place orders for OP IR Clinic follow up Needs to flush 5 cc daily Please send pt with Rx for flushes She will hear from IR for drain follow up time and date  Electronically Signed: , PA-C 01/02/2020, 10:14 AM   I spent a total of 15 Minutes at the the patient's bedside AND on the patient's hospital floor or unit, greater than 50% of which was counseling/coordinating care for perc chole drain

## 2020-01-02 NOTE — Plan of Care (Signed)

## 2020-01-02 NOTE — TOC Initial Note (Signed)
Transition of Care [facility]) - Initial/Assessment Note    Patient Details  Name: Cheryl Atkinson MRN: [id number] Date of Birth: 03-25-1931  [facility] CM/SW Contact:    Nonda Lou, LCSW Phone Number: 01/02/2020, 9:46 AM  Clinical Narrative:                 CSW received consult for possible SNF placement at time of discharge. CSW spoke with patient regarding PT recommendation of SNF placement at time of discharge. Patient reported that she could barely walk yesterday and expressed understanding of PT recommendation and is agreeable to SNF placement at time of discharge. Patient reports preference for a location in [place] close to [employer] where she lives. CSW informed patient referral will be made in [facility] for her in hopes of a SNF that isn't too far. CSW discussed insurance authorization process and informed patient she will be updated on what bed offers are received. CSW asked if there is anyone she would like contacted, she stated her son Cheryl Atkinson.  Patient expressed being hopeful for rehab and to feel better soon. CSW spoke with Cheryl Atkinson and updated him on the discharge plan. No further questions reported at this time. Pending Covid test for discharge readiness. CSW to continue to follow and assist with discharge planning needs.   Expected Discharge Plan: Home w Home Health Services Barriers to Discharge: Continued Medical Work up   Patient Goals and CMS Choice Patient states their goals for this hospitalization and ongoing recovery are:: to return to home CMS Medicare.gov Compare Post Acute Care list provided to:: Patient Choice offered to / list presented to : Patient  Expected Discharge Plan and Services Expected Discharge Plan: Home w Home Health Services   Discharge Planning Services: CM Consult Post Acute Care Choice: Home Health Living arrangements for the past 2 months: Single Family Home                 DME Arranged: N/A         HH Arranged: RN,  PT HH Agency: [facility] Date HH Agency Contacted: 12/29/19 Time HH Agency Contacted: 1554 Representative spoke with at [facility] Agency: Cassie  Prior Living Arrangements/Services Living arrangements for the past 2 months: Single Family Home Lives with:: Adult Children Patient language and need for interpreter reviewed:: Yes Do you feel safe going back to the place where you live?: Yes      Need for Family Participation in Patient Care: Yes (Comment) Care giver support system in place?: Yes (comment) Current home services: DME, Home PT, Home RN Criminal Activity/Legal Involvement Pertinent to Current Situation/Hospitalization: No - Comment as needed  Activities of Daily Living      Permission Sought/Granted   Permission granted to share information with : No              Emotional Assessment Appearance:: Appears stated age Attitude/Demeanor/Rapport: Engaged Affect (typically observed): Accepting Orientation: : Oriented to Situation, Oriented to  Time, Oriented to Place, Oriented to Self Alcohol / Substance Use: Not Applicable Psych Involvement: No (comment)  Admission diagnosis:  Acute cholecystitis [K81.0] Acute cholecystitis without calculus [K81.0] Cystitis [N30.90] Sepsis, due to unspecified organism, unspecified whether acute organ dysfunction present [facility]) [A41.9] Patient Active Problem List   Diagnosis Date Noted  . Acute cholecystitis 12/29/2019  . UTI (urinary tract infection) 12/29/2019  . Adrenal nodule (HCC) 12/29/2019  . Abdominal aortic ectasia (HCC) 12/29/2019  . Major depression 03/25/2018  . Cat bite of right  hand 12/21/2016  . BPPV (benign paroxysmal positional vertigo) 07/12/2016  . Memory loss 04/11/2016  . Mallet toe of right foot 10/20/2015  . Renal artery stenosis ([place]) 09/27/2015  . Hyperlipidemia 06/30/2015  . Claudication ([place]) 05/04/2015  . Atherosclerotic PVD with intermittent claudication ([place]) 04/26/2015  . Tinnitus 12/31/2014   . Former smoker 09/29/2014  . DNR (do not resuscitate) 09/29/2014  . Insulin resistance 07/19/2014  . Multinodular goiter 08/30/2013  . Spinal stenosis of lumbar region at multiple levels 09/29/2012  . HIP PAIN, BILATERAL 07/16/2010  . COPD (chronic obstructive pulmonary disease) ([place]) 09/20/2009  . CONSTIPATION, CHRONIC 09/20/2009  . Iron deficiency anemia 11/09/2007  . History of UTI 11/09/2007  . RESTLESS LEG SYNDROME 09/25/2007  . Essential hypertension 09/25/2007  . GERD 09/25/2007  . LOW BACK PAIN 09/25/2007  . Osteoporosis 09/25/2007   PCP:  Marin Olp, MD Pharmacy:   [facility] - [address], [place] [address] [place] [address] Phone: [phone] Fax: [phone]  [facility] [place], [place] [address] Phone: [phone] Fax: [phone]  [facility] [place], [place] [address] [place] [address] Phone: [phone] Fax: [phone]     Social Determinants of Health (SDOH) Interventions    Readmission Risk Interventions No flowsheet data found.

## 2020-01-02 NOTE — Progress Notes (Addendum)
PROGRESS NOTE   Cheryl Atkinson  BWL:[id number]    DOB: 09-Jun-1931    DOA: 12/28/2019  PCP: Shelva Majestic, MD   I have briefly reviewed patients previous medical records in [facility].  Chief Complaint:   Chief Complaint  Patient presents with  . Abdominal Pain    Brief Narrative:  86 year old female, lives with her son and ambulates with the help of a cane or walker, PMH of anemia, chronic low back pain, constipation, GERD, hiatal hernia, hypertension, PAD, hyperlipidemia, prediabetes, presented to the ED on 1/27 due to right lower quadrant abdominal pain and nausea.  Clinical picture and imaging consistent with acute cholecystitis.  General surgery consulted, not a good surgical candidate, hence IR placed percutaneous cholecystostomy 1/28.  Patient is medically stable for discharge pending SNF bed/insurance approval likely to happen in the next 1 to 2 days.   Assessment & Plan:  Principal Problem:   Acute cholecystitis Active Problems:   Atherosclerotic PVD with intermittent claudication (HCC)   UTI (urinary tract infection)   Adrenal nodule (HCC)   Abdominal aortic ectasia (HCC)   Acute cholecystitis/Klebsiella pneumoniae: On admission patient was febrile at 100.6 F.  She was not tachycardic, hypotensive or hypoxic.  Did not meet sepsis criteria on admission.  WBC 14.2 and lactate 2.1.  Lipase and LFTs normal.  CT abdomen 1/27: Markedly hydropic gallbladder with extensive pericholecystic inflammation and marked intra and extrahepatic biliary ductal dilatation to the level of the pancreatic head.  Findings compatible with acute cholecystitis.  No visible gallstones.  Treated conservatively with bowel rest/NPO, IV fluids, IV ceftriaxone and metronidazole, pain management.  General surgery was consulted, got MRCP which confirmed acute cholecystitis.  Probable calculus lodged within the neck of the gallbladder.  Dilatation of the common hepatic duct and common bile duct without  obstructing lesion.  No evidence of pancreatitis.  CCS consulted IR who did CT-guided percutaneous cholecystostomy on 1/28 because patient is a poor surgical candidate.  Continue drain management per IR.  Improved.  Biliary culture reintubated for better growth and still pending.  IR follow-up appreciated, drain to remain in place for 8 weeks, outpatient follow-up with drain clinic and CCS.  I discussed with Dr. Daphine Deutscher, CCS and per recommendation changed Zosyn to Augmentin x7 more days.  However biliary fluid culture came back positive for Klebsiella pneumonia, resistant to ampicillin and Augmentin and hence as discussed with pharmacy, placed on Keflex for 7 days.  Acute cystitis: CT abdomen 1/27 had findings suggestive of cystitis.  Urine microscopy showed many bacteria and pyuria.  Unfortunately it appears that urine culture was never sent.  Completed treatment for this indication.  Indeterminate 1.4 cm left adrenal nodule: Radiology recommends further characterization with adrenal mass protocol CT or MRI which can be done as outpatient once acute issues have resolved.  Infrarenal abdominal aortic focal fusiform ectasia 2.5 cm diameter: As per radiology, at risk for aneurysmal development and recommend follow-up by ultrasound in 5 years.  Essential hypertension: Controlled.  Atherosclerotic PAD with intermittent claudication: Followed by Dr. Allyson Sabal with Cardiology.  Aspirin and Plavix had been held for procedures, completed.  After discussing with Dr. Daphine Deutscher, CCS on 1/30, resumed aspirin and Plavix.  Resume statins.  Prediabetes: A1c 6.1 in 10/2019.  Takes Metformin at home, currently on hold.  Well-controlled on NovoLog SSI.  Resume Metformin at discharge.  Hypokalemia: Replaced.  Acute kidney injury: Mild.  Resolved.  Anemia, suspect of chronic disease: Stable.  Constipation: Initiated bowel regimen but  still has not had a BM, passing flatus.  Trial of Dulcolax suppository today.  Paroxysmal  atrial fibrillation: Not sure if this is old or new for her.  Controlled ventricular rate at this time.  Get EKG.  Monitor on telemetry.  Continue aspirin and Plavix for now.  May not be good candidate for anticoagulation but consider discussing with her cardiologist in a.m.  Physical deconditioning: Secondary to advanced age and acute illness complicating multiple comorbidities.  Patient was unable to ambulate yesterday.  She has changed her mind and agrees to SNF.  TOC consulted.  Body mass index is 23.24 kg/m.   DVT prophylaxis: SCDs Code Status: DNR Family Communication: I called and discussed in detail with patient's son on 1/31, updated care and answered questions.  He understands that patient will be going to an SNF pending bed availability and insurance approval. Disposition:  . Patient came from: Home           . Anticipated d/c place: SNF . Barriers to d/c: Pending SNF bed availability and insurance approval.   Consultants:   General surgery Interventional radiology  Procedures:   CT-guided percutaneous cholecystostomy 1/28  Antimicrobials:   IV ceftriaxone x1 dose 1/26 IVF metronidazole 1/26-1/27 IV Zosyn 1/27 > 1/31 Augmentin 1/31 >   Subjective:  No BM yet.  Passing flatus.  Minimal abdominal pain.  Tolerating diet well without nausea or vomiting.  Reports that she is quite weak and was unable to ambulate, no one to be with her 24/7 at home and hence has changed her mind and agreeable to SNF for rehab now.  Objective:   Vitals:   01/01/20 1443 01/01/20 2134 01/01/20 2246 01/02/20 0441  BP: 95/73 (!) 145/84  129/60  Pulse: 67 68  62  Resp: 18 16  16   Temp: 97.6 F (36.4 C) 97.6 F (36.4 C) 98.7 F (37.1 C) 98.8 F (37.1 C)  TempSrc: Oral Oral Oral Oral  SpO2: 99% 96%  98%  Weight:      Height:        General exam: Elderly female, moderately built and nourished sitting up comfortably in bed without distress. Respiratory system: Clear to auscultation.   No increased work of breathing. Cardiovascular system: S1 and S2 heard, RRR.  No JVD, murmurs or pedal edema. Gastrointestinal system: Abdomen minimally distended but soft and without tenderness.  RUQ drain with minimal drainage.  No organomegaly or masses appreciated.  Normal bowel sounds heard. Central nervous system: Alert and oriented. No focal neurological deficits. Extremities: Symmetric 5 x 5 power. Skin: No rashes, lesions or ulcers Psychiatry: Judgement and insight appear normal. Mood & affect appropriate.     Data Reviewed:   I have personally reviewed following labs and imaging studies   CBC: Recent Labs  Lab 12/31/19 0613 01/01/20 0340 01/02/20 0413  WBC 14.5* 9.0 7.6  HGB 10.6* 10.2* 10.3*  HCT 33.1* 30.8* 32.3*  MCV 91.2 90.3 91.5  PLT 198 202 235    Basic Metabolic Panel: Recent Labs  Lab 12/31/19 0613 01/01/20 0340 01/02/20 0413  NA 140 139 140  K 4.4 3.7 3.8  CL 112* 110 111  CO2 21* 20* 18*  GLUCOSE 117* 123* 119*  BUN 24* 30* 20  CREATININE 1.04* 0.88 0.67  CALCIUM 8.2* 8.1* 8.4*    Liver Function Tests: Recent Labs  Lab 12/30/19 0235 01/01/20 0340 01/02/20 0413  AST 20 17 33  ALT 16 16 26   ALKPHOS 83 63 54  BILITOT 1.0 0.8  0.5  PROT 6.0* 5.6* 5.7*  ALBUMIN 2.6* 2.2* 2.4*    CBG: Recent Labs  Lab 01/01/20 1707 01/01/20 2102 01/02/20 0754  GLUCAP 124* 123* 109*    Microbiology Studies:   Recent Results (from the past 240 hour(s))  Culture, blood (Routine X 2) w Reflex to ID Panel     Status: None (Preliminary result)   Collection Time: 12/29/19  4:27 AM   Specimen: BLOOD LEFT FOREARM  Result Value Ref Range Status   Specimen Description BLOOD LEFT FOREARM  Final   Special Requests   Final    BOTTLES DRAWN AEROBIC AND ANAEROBIC Blood Culture results may not be optimal due to an inadequate volume of blood received in culture bottles   Culture   Final    NO GROWTH 3 DAYS Performed at [facility], [address]  [place], [place] [address]    Report Status PENDING  Incomplete  Respiratory Panel by RT PCR (Flu A&B, Covid) - Nasopharyngeal Swab     Status: None   Collection Time: 12/29/19  5:04 AM   Specimen: Nasopharyngeal Swab  Result Value Ref Range Status   SARS Coronavirus 2 by RT PCR NEGATIVE NEGATIVE Final    Comment: (NOTE) SARS-CoV-2 target nucleic acids are NOT DETECTED. The SARS-CoV-2 RNA is generally detectable in upper respiratoy specimens during the acute phase of infection. The lowest concentration of SARS-CoV-2 viral copies this assay can detect is 131 copies/mL. A negative result does not preclude SARS-Cov-2 infection and should not be used as the sole basis for treatment or other patient management decisions. A negative result may occur with  improper specimen collection/handling, submission of specimen other than nasopharyngeal swab, presence of viral mutation(s) within the areas targeted by this assay, and inadequate number of viral copies (<131 copies/mL). A negative result must be combined with clinical observations, patient history, and epidemiological information. The expected result is Negative. Fact Sheet for Patients:  https://www.moore.com/ Fact Sheet for Healthcare Providers:  https://www.young.biz/ This test is not yet ap proved or cleared by the [place] FDA and  has been authorized for detection and/or diagnosis of SARS-CoV-2 by FDA under an Emergency Use Authorization (EUA). This EUA will remain  in effect (meaning this test can be used) for the duration of the COVID-19 declaration under Section 564(b)(1) of the Act, 21 U.S.C. section 360bbb-3(b)(1), unless the authorization is terminated or revoked sooner.    Influenza A by PCR NEGATIVE NEGATIVE Final   Influenza B by PCR NEGATIVE NEGATIVE Final    Comment: (NOTE) The Xpert Xpress SARS-CoV-2/FLU/RSV assay is intended as an aid in  the diagnosis of influenza from  Nasopharyngeal swab specimens and  should not be used as a sole basis for treatment. Nasal washings and  aspirates are unacceptable for Xpert Xpress SARS-CoV-2/FLU/RSV  testing. Fact Sheet for Patients: https://www.moore.com/ Fact Sheet for Healthcare Providers: https://www.young.biz/ This test is not yet approved or cleared by the [place] FDA and  has been authorized for detection and/or diagnosis of SARS-CoV-2 by  FDA under an Emergency Use Authorization (EUA). This EUA will remain  in effect (meaning this test can be used) for the duration of the  Covid-19 declaration under Section 564(b)(1) of the Act, 21  U.S.C. section 360bbb-3(b)(1), unless the authorization is  terminated or revoked. Performed at [facility], [address], [place], [place] [address]   Culture, blood (Routine X 2) w Reflex to ID Panel     Status: None (Preliminary result)  Collection Time: 12/29/19  7:15 AM   Specimen: BLOOD  Result Value Ref Range Status   Specimen Description BLOOD LEFT ANTECUBITAL  Final   Special Requests   Final    BOTTLES DRAWN AEROBIC AND ANAEROBIC Blood Culture results may not be optimal due to an excessive volume of blood received in culture bottles   Culture   Final    NO GROWTH 3 DAYS Performed at [facility], [address], [place], [place] [address]    Report Status PENDING  Incomplete  Aerobic/Anaerobic Culture (surgical/deep wound)     Status: None (Preliminary result)   Collection Time: 12/30/19  3:26 PM   Specimen: BILE  Result Value Ref Range Status   Specimen Description BILE  Final   Special Requests NONE  Final   Gram Stain   Final    ABUNDANT WBC PRESENT, PREDOMINANTLY PMN NO ORGANISMS SEEN    Culture   Final    CULTURE REINCUBATED FOR BETTER GROWTH Performed at [facility], [address], [place], [place] [address]    Report Status PENDING  Incomplete     Radiology Studies:  No results  found.   Scheduled Meds:   . aspirin EC  81 mg Oral Daily  . atorvastatin  40 mg Oral Daily  . bisacodyl  10 mg Rectal Once  . clopidogrel  75 mg Oral Q breakfast  . insulin aspart  0-9 Units Subcutaneous TID WC  . polyethylene glycol  17 g Oral Daily  . Rotigotine  3 mg Transdermal QHS  . sodium chloride flush  5 mL Intracatheter Q8H    Continuous Infusions:   . piperacillin-tazobactam (ZOSYN)  IV 3.375 g (01/02/20 0807)     LOS: 4 days     Marcellus Scott, MD, [place], [facility]. [facility]    To contact the attending provider between 7A-7P or the covering provider during after hours 7P-7A, please log into the web site [email or web address] and access using universal [place] password for that web site. If you do not have the password, please call the hospital operator.  01/02/2020, 11:18 AM

## 2020-01-02 NOTE — Progress Notes (Signed)
Patient ID: Cheryl Atkinson, female   DOB: May 13, 1931, 86 y.o.   MRN: [id number] [facility] Surgery Progress Note:   * No surgery found *  Subjective: Mental status is clear.  She is trying to eat an enormous breakfast.  She says that she cannot walk-too weak.   Objective: Vital signs in last 24 hours: Temp:  [97.6 F (36.4 C)-98.8 F (37.1 C)] 98.8 F (37.1 C) (01/31 0441) Pulse Rate:  [62-68] 62 (01/31 0441) Resp:  [16-18] 16 (01/31 0441) BP: (95-145)/(60-84) 129/60 (01/31 0441) SpO2:  [96 %-99 %] 98 % (01/31 0441)  Intake/Output from previous day: 01/30 0701 - 01/31 0700 In: 996 [P.O.:981] Out: 750 [Urine:600; Drains:150] Intake/Output this shift: No intake/output data recorded.  Physical Exam: Work of breathing is not labored.  Perc drain in GB  Lab Results:  Results for orders placed or performed during the hospital encounter of 12/28/19 (from the past 48 hour(s))  Glucose, capillary     Status: Abnormal   Collection Time: 12/31/19 11:37 AM  Result Value Ref Range   Glucose-Capillary 122 (H) 70 - 99 mg/dL  Glucose, capillary     Status: None   Collection Time: 12/31/19  5:12 PM  Result Value Ref Range   Glucose-Capillary 96 70 - 99 mg/dL  Glucose, capillary     Status: Abnormal   Collection Time: 12/31/19  8:11 PM  Result Value Ref Range   Glucose-Capillary 144 (H) 70 - 99 mg/dL  Glucose, capillary     Status: Abnormal   Collection Time: 12/31/19 11:50 PM  Result Value Ref Range   Glucose-Capillary 121 (H) 70 - 99 mg/dL  Comprehensive metabolic panel     Status: Abnormal   Collection Time: 01/01/20  3:40 AM  Result Value Ref Range   Sodium 139 135 - 145 mmol/L   Potassium 3.7 3.5 - 5.1 mmol/L   Chloride 110 98 - 111 mmol/L   CO2 20 (L) 22 - 32 mmol/L   Glucose, Bld 123 (H) 70 - 99 mg/dL   BUN 30 (H) 8 - 23 mg/dL   Creatinine, Ser 1.19 0.44 - 1.00 mg/dL   Calcium 8.1 (L) 8.9 - 10.3 mg/dL   Total Protein 5.6 (L) 6.5 - 8.1 g/dL   Albumin 2.2 (L) 3.5 - 5.0  g/dL   AST 17 15 - 41 U/L   ALT 16 0 - 44 U/L   Alkaline Phosphatase 63 38 - 126 U/L   Total Bilirubin 0.8 0.3 - 1.2 mg/dL   GFR calc non Af Amer 59 (L) >60 mL/min   GFR calc Af Amer >60 >60 mL/min   Anion gap 9 5 - 15    Comment: Performed at [facility], [address], [place], [place] [address]  CBC     Status: Abnormal   Collection Time: 01/01/20  3:40 AM  Result Value Ref Range   WBC 9.0 4.0 - 10.5 K/uL   RBC 3.41 (L) 3.87 - 5.11 MIL/uL   Hemoglobin 10.2 (L) 12.0 - 15.0 g/dL   HCT 81.4 (L) 48.1 - 85.6 %   MCV 90.3 80.0 - 100.0 fL   MCH 29.9 26.0 - 34.0 pg   MCHC 33.1 30.0 - 36.0 g/dL   RDW 31.4 97.0 - 26.3 %   Platelets 202 150 - 400 K/uL   nRBC 0.0 0.0 - 0.2 %    Comment: Performed at [facility], [address] [place], [place] [address]  Hemoglobin A1c  Status: Abnormal   Collection Time: 01/01/20  3:40 AM  Result Value Ref Range   Hgb A1c MFr Bld 5.9 (H) 4.8 - 5.6 %    Comment: (NOTE) Pre diabetes:          5.7%-6.4% Diabetes:              >6.4% Glycemic control for   <7.0% adults with diabetes    Mean Plasma Glucose 122.63 mg/dL    Comment: Performed at [facility] [address] [place], [place] [address]  Glucose, capillary     Status: Abnormal   Collection Time: 01/01/20  4:00 AM  Result Value Ref Range   Glucose-Capillary 114 (H) 70 - 99 mg/dL  Glucose, capillary     Status: Abnormal   Collection Time: 01/01/20  7:19 AM  Result Value Ref Range   Glucose-Capillary 106 (H) 70 - 99 mg/dL  Glucose, capillary     Status: Abnormal   Collection Time: 01/01/20 11:40 AM  Result Value Ref Range   Glucose-Capillary 117 (H) 70 - 99 mg/dL  Glucose, capillary     Status: Abnormal   Collection Time: 01/01/20  5:07 PM  Result Value Ref Range   Glucose-Capillary 124 (H) 70 - 99 mg/dL  Glucose, capillary     Status: Abnormal   Collection Time: 01/01/20  9:02 PM  Result Value Ref Range   Glucose-Capillary 123 (H) 70 - 99 mg/dL   Comprehensive metabolic panel     Status: Abnormal   Collection Time: 01/02/20  4:13 AM  Result Value Ref Range   Sodium 140 135 - 145 mmol/L   Potassium 3.8 3.5 - 5.1 mmol/L   Chloride 111 98 - 111 mmol/L   CO2 18 (L) 22 - 32 mmol/L   Glucose, Bld 119 (H) 70 - 99 mg/dL   BUN 20 8 - 23 mg/dL   Creatinine, Ser 0.67 0.44 - 1.00 mg/dL   Calcium 8.4 (L) 8.9 - 10.3 mg/dL   Total Protein 5.7 (L) 6.5 - 8.1 g/dL   Albumin 2.4 (L) 3.5 - 5.0 g/dL   AST 33 15 - 41 U/L   ALT 26 0 - 44 U/L   Alkaline Phosphatase 54 38 - 126 U/L   Total Bilirubin 0.5 0.3 - 1.2 mg/dL   GFR calc non Af Amer >60 >60 mL/min   GFR calc Af Amer >60 >60 mL/min   Anion gap 11 5 - 15    Comment: Performed at [facility], [address] [place], [place] [address]  CBC     Status: Abnormal   Collection Time: 01/02/20  4:13 AM  Result Value Ref Range   WBC 7.6 4.0 - 10.5 K/uL   RBC 3.53 (L) 3.87 - 5.11 MIL/uL   Hemoglobin 10.3 (L) 12.0 - 15.0 g/dL   HCT 32.3 (L) 36.0 - 46.0 %   MCV 91.5 80.0 - 100.0 fL   MCH 29.2 26.0 - 34.0 pg   MCHC 31.9 30.0 - 36.0 g/dL   RDW 13.6 11.5 - 15.5 %   Platelets 219 150 - 400 K/uL   nRBC 0.0 0.0 - 0.2 %    Comment: Performed at [facility], [address], [place], [place] [address]  Glucose, capillary     Status: Abnormal   Collection Time: 01/02/20  7:54 AM  Result Value Ref Range   Glucose-Capillary 109 (H) 70 - 99 mg/dL    Radiology/Results: No results found.  Anti-infectives: Anti-infectives (From  admission, onward)   Start     Dose/Rate Route Frequency Ordered Stop   12/29/19 1400  piperacillin-tazobactam (ZOSYN) IVPB 3.375 g     3.375 g 12.5 mL/hr over 240 Minutes Intravenous Every 8 hours 12/29/19 1136     12/29/19 1200  cefTRIAXone (ROCEPHIN) 2 g in sodium chloride 0.9 % 100 mL IVPB  Status:  Discontinued     2 g 200 mL/hr over 30 Minutes Intravenous Every 24 hours 12/29/19 0623 12/29/19 1136   12/29/19 1000  metroNIDAZOLE (FLAGYL) IVPB 500 mg   Status:  Discontinued     500 mg 100 mL/hr over 60 Minutes Intravenous Every 8 hours 12/29/19 0623 12/29/19 1136   12/29/19 0415  metroNIDAZOLE (FLAGYL) IVPB 500 mg     500 mg 100 mL/hr over 60 Minutes Intravenous  Once 12/29/19 0401 12/29/19 0510   12/29/19 0245  cefTRIAXone (ROCEPHIN) 1 g in sodium chloride 0.9 % 100 mL IVPB     1 g 200 mL/hr over 30 Minutes Intravenous  Once 12/29/19 0231 12/29/19 0346      Assessment/Plan: Problem List: Patient Active Problem List   Diagnosis Date Noted  . Acute cholecystitis 12/29/2019  . UTI (urinary tract infection) 12/29/2019  . Adrenal nodule (HCC) 12/29/2019  . Abdominal aortic ectasia (HCC) 12/29/2019  . Major depression 03/25/2018  . Cat bite of right hand 12/21/2016  . BPPV (benign paroxysmal positional vertigo) 07/12/2016  . Memory loss 04/11/2016  . Mallet toe of right foot 10/20/2015  . Renal artery stenosis (HCC) 09/27/2015  . Hyperlipidemia 06/30/2015  . Claudication (HCC) 05/04/2015  . Atherosclerotic PVD with intermittent claudication (HCC) 04/26/2015  . Tinnitus 12/31/2014  . Former smoker 09/29/2014  . DNR (do not resuscitate) 09/29/2014  . Insulin resistance 07/19/2014  . Multinodular goiter 08/30/2013  . Spinal stenosis of lumbar region at multiple levels 09/29/2012  . HIP PAIN, BILATERAL 07/16/2010  . COPD (chronic obstructive pulmonary disease) (HCC) 09/20/2009  . CONSTIPATION, CHRONIC 09/20/2009  . Iron deficiency anemia 11/09/2007  . History of UTI 11/09/2007  . RESTLESS LEG SYNDROME 09/25/2007  . Essential hypertension 09/25/2007  . GERD 09/25/2007  . LOW BACK PAIN 09/25/2007  . Osteoporosis 09/25/2007    For discharge to SNF.  CCS can follow up in the office in 4 weeks.  * No surgery found *    LOS: 4 days   Matt B. Daphine Deutscher, MD, [facility] [phone] beeper [phone]  01/02/2020 8:53 AM

## 2020-01-02 NOTE — TOC Progression Note (Signed)
Transition of Care [facility]) - Progression Note    Patient Details  Name: Cheryl Atkinson MRN: [id number] Date of Birth: 1931/05/21  Transition of Care [facility]) CM/SW Contact  Nonda Lou, [place] Phone Number: 01/02/2020, 3:00 PM  Clinical Narrative:    Spoke bedside with patient and provided her with facilities offering beds. Patient requested Cheryl Atkinson, CSW confirmed they are able to take her tomorrow. Informed patient discharge will be tomorrow, pending covid results.    Expected Discharge Plan: Home w Home Health Services Barriers to Discharge: Continued Medical Work up  Expected Discharge Plan and Services Expected Discharge Plan: Home w Home Health Services   Discharge Planning Services: CM Consult Post Acute Care Choice: Home Health Living arrangements for the past 2 months: Single Family Home                 DME Arranged: N/A         HH Arranged: RN, PT HH Agency: [facility] Date HH Agency Contacted: 12/29/19 Time HH Agency Contacted: 1554 Representative spoke with at [facility] Agency: Cassie   Social Determinants of Health (SDOH) Interventions    Readmission Risk Interventions No flowsheet data found.

## 2020-01-02 NOTE — [place] FL2 (Signed)
MEDICAID FL2 LEVEL OF CARE SCREENING TOOL     IDENTIFICATION  Patient Name: Cheryl Atkinson Birthdate: 11-26-1931 Sex: female Admission Date (Current Location): 12/28/2019  [facility] and [place] Number:  Producer, television/film/video and Address:  [facility]. [employer], [address], [place], [place] [address]      Provider Number: [id number]  Attending Physician Name and Address:  Elease Etienne, MD  Relative Name and Phone Number:  Tammy Sours, son [phone]    Current Level of Care: Hospital Recommended Level of Care: Skilled Nursing Facility Prior Approval Number:    Date Approved/Denied:   PASRR Number: [id number]  Discharge Plan: SNF    Current Diagnoses: Patient Active Problem List   Diagnosis Date Noted  . Acute cholecystitis 12/29/2019  . UTI (urinary tract infection) 12/29/2019  . Adrenal nodule (HCC) 12/29/2019  . Abdominal aortic ectasia (HCC) 12/29/2019  . Major depression 03/25/2018  . Cat bite of right hand 12/21/2016  . BPPV (benign paroxysmal positional vertigo) 07/12/2016  . Memory loss 04/11/2016  . Mallet toe of right foot 10/20/2015  . Renal artery stenosis (HCC) 09/27/2015  . Hyperlipidemia 06/30/2015  . Claudication (HCC) 05/04/2015  . Atherosclerotic PVD with intermittent claudication (HCC) 04/26/2015  . Tinnitus 12/31/2014  . Former smoker 09/29/2014  . DNR (do not resuscitate) 09/29/2014  . Insulin resistance 07/19/2014  . Multinodular goiter 08/30/2013  . Spinal stenosis of lumbar region at multiple levels 09/29/2012  . HIP PAIN, BILATERAL 07/16/2010  . COPD (chronic obstructive pulmonary disease) (HCC) 09/20/2009  . CONSTIPATION, CHRONIC 09/20/2009  . Iron deficiency anemia 11/09/2007  . History of UTI 11/09/2007  . RESTLESS LEG SYNDROME 09/25/2007  . Essential hypertension 09/25/2007  . GERD 09/25/2007  . LOW BACK PAIN 09/25/2007  . Osteoporosis 09/25/2007    Orientation RESPIRATION BLADDER Height & Weight     Self, Time, Situation, Place  Normal Incontinent, External catheter Weight: 143 lb 15.4 oz (65.3 kg) Height:  5\' 6"  (167.6 cm)  BEHAVIORAL SYMPTOMS/MOOD NEUROLOGICAL BOWEL NUTRITION STATUS      Continent Diet(see d/c summary)  AMBULATORY STATUS COMMUNICATION OF NEEDS Skin   Extensive Assist Verbally Normal                       Personal Care Assistance Level of Assistance  Bathing, Dressing, Feeding Bathing Assistance: Maximum assistance Feeding assistance: Independent Dressing Assistance: Maximum assistance     Functional Limitations Info             SPECIAL CARE FACTORS FREQUENCY  PT (By licensed PT), OT (By licensed OT)     PT Frequency: 5x a week OT Frequency: 5x a week            Contractures Contractures Info: Not present    Additional Factors Info  Code Status, Allergies Code Status Info: DNR Allergies Info: Codeine           Current Medications (01/02/2020):  This is the current hospital active medication list Current Facility-Administered Medications  Medication Dose Route Frequency Provider Last Rate Last Admin  . acetaminophen (TYLENOL) tablet 650 mg  650 mg Oral Q6H PRN 01/04/2020, MD       Or  . acetaminophen (TYLENOL) suppository 650 mg  650 mg Rectal Q6H PRN John Giovanni, MD      . albuterol (PROVENTIL) (2.5 MG/3ML) 0.083% nebulizer solution 2.5 mg  2.5 mg Inhalation Q6H PRN John Giovanni, MD      . aspirin  EC tablet 81 mg  81 mg Oral Daily Modena Jansky, MD   81 mg at 01/01/20 1134  . atorvastatin (LIPITOR) tablet 40 mg  40 mg Oral Daily Modena Jansky, MD   40 mg at 01/01/20 1135  . bisacodyl (DULCOLAX) suppository 10 mg  10 mg Rectal Once Hongalgi, Anand D, MD      . clopidogrel (PLAVIX) tablet 75 mg  75 mg Oral Q breakfast Modena Jansky, MD   75 mg at 01/02/20 0806  . insulin aspart (novoLOG) injection 0-9 Units  0-9 Units Subcutaneous TID WC Modena Jansky, MD   1 Units at 01/01/20 1857  . morphine 2  MG/ML injection 1 mg  1 mg Intravenous Q3H PRN Shela Leff, MD   1 mg at 12/31/19 2324  . ondansetron (ZOFRAN) injection 4 mg  4 mg Intravenous Q6H PRN Shela Leff, MD      . piperacillin-tazobactam (ZOSYN) IVPB 3.375 g  3.375 g Intravenous Q8H Meuth, Brooke A, PA-C 12.5 mL/hr at 01/02/20 0807 3.375 g at 01/02/20 0807  . polyethylene glycol (MIRALAX / GLYCOLAX) packet 17 g  17 g Oral Daily Modena Jansky, MD   17 g at 01/01/20 1134  . Rotigotine (Neupro) 3 mg/24 hrs patch - patient's own supply  3 mg Transdermal QHS Shela Leff, MD   3 mg at 01/01/20 2144  . simethicone (MYLICON) chewable tablet 80 mg  80 mg Oral Q6H PRN Shelda Pal, DO   80 mg at 12/31/19 2144  . sodium chloride flush (NS) 0.9 % injection 5 mL  5 mL Intracatheter Q8H Markus Daft, MD   5 mL at 01/02/20 0540     Discharge Medications: Please see discharge summary for a list of discharge medications.  Relevant Imaging Results:  Relevant Lab Results:   Additional Information SSN [id number]  Rael Tilly J Riddick, LCSW

## 2020-01-03 DIAGNOSIS — I10 Essential (primary) hypertension: Secondary | ICD-10-CM | POA: Diagnosis not present

## 2020-01-03 DIAGNOSIS — U071 COVID-19: Secondary | ICD-10-CM | POA: Diagnosis not present

## 2020-01-03 DIAGNOSIS — K810 Acute cholecystitis: Secondary | ICD-10-CM | POA: Diagnosis not present

## 2020-01-03 DIAGNOSIS — I77811 Abdominal aortic ectasia: Secondary | ICD-10-CM | POA: Diagnosis not present

## 2020-01-03 DIAGNOSIS — M1990 Unspecified osteoarthritis, unspecified site: Secondary | ICD-10-CM | POA: Diagnosis not present

## 2020-01-03 DIAGNOSIS — I4891 Unspecified atrial fibrillation: Secondary | ICD-10-CM | POA: Diagnosis not present

## 2020-01-03 DIAGNOSIS — R1311 Dysphagia, oral phase: Secondary | ICD-10-CM | POA: Diagnosis not present

## 2020-01-03 DIAGNOSIS — N3090 Cystitis, unspecified without hematuria: Secondary | ICD-10-CM | POA: Diagnosis not present

## 2020-01-03 DIAGNOSIS — N390 Urinary tract infection, site not specified: Secondary | ICD-10-CM | POA: Diagnosis not present

## 2020-01-03 DIAGNOSIS — E785 Hyperlipidemia, unspecified: Secondary | ICD-10-CM | POA: Diagnosis not present

## 2020-01-03 DIAGNOSIS — K819 Cholecystitis, unspecified: Secondary | ICD-10-CM | POA: Diagnosis not present

## 2020-01-03 DIAGNOSIS — N179 Acute kidney failure, unspecified: Secondary | ICD-10-CM | POA: Diagnosis not present

## 2020-01-03 DIAGNOSIS — K219 Gastro-esophageal reflux disease without esophagitis: Secondary | ICD-10-CM | POA: Diagnosis not present

## 2020-01-03 DIAGNOSIS — R5381 Other malaise: Secondary | ICD-10-CM | POA: Diagnosis not present

## 2020-01-03 DIAGNOSIS — E039 Hypothyroidism, unspecified: Secondary | ICD-10-CM | POA: Diagnosis not present

## 2020-01-03 DIAGNOSIS — Z7401 Bed confinement status: Secondary | ICD-10-CM | POA: Diagnosis not present

## 2020-01-03 DIAGNOSIS — R531 Weakness: Secondary | ICD-10-CM | POA: Diagnosis not present

## 2020-01-03 DIAGNOSIS — R278 Other lack of coordination: Secondary | ICD-10-CM | POA: Diagnosis not present

## 2020-01-03 DIAGNOSIS — F330 Major depressive disorder, recurrent, mild: Secondary | ICD-10-CM | POA: Diagnosis not present

## 2020-01-03 DIAGNOSIS — R41841 Cognitive communication deficit: Secondary | ICD-10-CM | POA: Diagnosis not present

## 2020-01-03 DIAGNOSIS — M2550 Pain in unspecified joint: Secondary | ICD-10-CM | POA: Diagnosis not present

## 2020-01-03 DIAGNOSIS — I739 Peripheral vascular disease, unspecified: Secondary | ICD-10-CM | POA: Diagnosis not present

## 2020-01-03 DIAGNOSIS — E8809 Other disorders of plasma-protein metabolism, not elsewhere classified: Secondary | ICD-10-CM | POA: Diagnosis not present

## 2020-01-03 DIAGNOSIS — R7989 Other specified abnormal findings of blood chemistry: Secondary | ICD-10-CM | POA: Diagnosis not present

## 2020-01-03 DIAGNOSIS — E278 Other specified disorders of adrenal gland: Secondary | ICD-10-CM | POA: Diagnosis not present

## 2020-01-03 DIAGNOSIS — M6281 Muscle weakness (generalized): Secondary | ICD-10-CM | POA: Diagnosis not present

## 2020-01-03 DIAGNOSIS — Z79899 Other long term (current) drug therapy: Secondary | ICD-10-CM | POA: Diagnosis not present

## 2020-01-03 DIAGNOSIS — D649 Anemia, unspecified: Secondary | ICD-10-CM | POA: Diagnosis not present

## 2020-01-03 DIAGNOSIS — M6259 Muscle wasting and atrophy, not elsewhere classified, multiple sites: Secondary | ICD-10-CM | POA: Diagnosis not present

## 2020-01-03 DIAGNOSIS — R52 Pain, unspecified: Secondary | ICD-10-CM | POA: Diagnosis not present

## 2020-01-03 DIAGNOSIS — K8020 Calculus of gallbladder without cholecystitis without obstruction: Secondary | ICD-10-CM | POA: Diagnosis not present

## 2020-01-03 DIAGNOSIS — E46 Unspecified protein-calorie malnutrition: Secondary | ICD-10-CM | POA: Diagnosis not present

## 2020-01-03 LAB — GLUCOSE, CAPILLARY
Glucose-Capillary: 121 mg/dL — ABNORMAL HIGH (ref 70–99)
Glucose-Capillary: 112 mg/dL — ABNORMAL HIGH (ref 70–99)

## 2020-01-03 LAB — CULTURE, BLOOD (ROUTINE X 2)
Culture: NO GROWTH
Culture: NO GROWTH

## 2020-01-03 LAB — CBC
HCT: 33.5 % — ABNORMAL LOW (ref 36.0–46.0)
Hemoglobin: 10.7 g/dL — ABNORMAL LOW (ref 12.0–15.0)
MCH: 29.2 pg (ref 26.0–34.0)
MCHC: 31.9 g/dL (ref 30.0–36.0)
MCV: 91.5 fL (ref 80.0–100.0)
Platelets: 266 10*3/uL (ref 150–400)
RBC: 3.66 MIL/uL — ABNORMAL LOW (ref 3.87–5.11)
RDW: 13.3 % (ref 11.5–15.5)
WBC: 8.7 10*3/uL (ref 4.0–10.5)
nRBC: 0 % (ref 0.0–0.2)

## 2020-01-03 LAB — COMPREHENSIVE METABOLIC PANEL
ALT: 25 U/L (ref 0–44)
AST: 23 U/L (ref 15–41)
Albumin: 2.5 g/dL — ABNORMAL LOW (ref 3.5–5.0)
Alkaline Phosphatase: 61 U/L (ref 38–126)
Anion gap: 12 (ref 5–15)
BUN: 13 mg/dL (ref 8–23)
CO2: 23 mmol/L (ref 22–32)
Calcium: 8.8 mg/dL — ABNORMAL LOW (ref 8.9–10.3)
Chloride: 106 mmol/L (ref 98–111)
Creatinine, Ser: 0.65 mg/dL (ref 0.44–1.00)
GFR calc Af Amer: >60 mL/min (ref >60)
GFR calc non Af Amer: >60 mL/min (ref >60)
Glucose, Bld: 119 mg/dL — ABNORMAL HIGH (ref 70–99)
Potassium: 3.8 mmol/L (ref 3.5–5.1)
Sodium: 141 mmol/L (ref 135–145)
Total Bilirubin: 0.8 mg/dL (ref 0.3–1.2)
Total Protein: 5.9 g/dL — ABNORMAL LOW (ref 6.5–8.1)

## 2020-01-03 MED ORDER — APIXABAN 5 MG PO TABS: 5.0000 mg | ORAL_TABLET | Freq: Two times a day (BID) | ORAL | Status: DC

## 2020-01-03 MED ORDER — POLYETHYLENE GLYCOL 3350 17 G PO PACK: 17.0000 g | PACK | Freq: Two times a day (BID) | ORAL | Status: DC

## 2020-01-03 MED ORDER — SODIUM CHLORIDE 0.9% FLUSH: 5.0000 mL | Freq: Three times a day (TID) | INTRAVENOUS | Status: DC

## 2020-01-03 MED ORDER — CEPHALEXIN 500 MG PO CAPS: 500.0000 mg | ORAL_CAPSULE | Freq: Four times a day (QID) | ORAL | Status: DC

## 2020-01-03 MED ORDER — ACETAMINOPHEN 325 MG PO TABS: 650.0000 mg | ORAL_TABLET | Freq: Four times a day (QID) | ORAL | Status: DC | PRN

## 2020-01-03 NOTE — Progress Notes (Signed)
[facility] Progress Note     Subjective: CC: no complaints Patient denies abdominal pain this AM. Denies nausea. Tolerating diet, had a small BM yesterday. She is planned for discharge to SNF today. Patient is hopeful to be able to go home from SNF   Review of Systems  Constitutional: Negative for chills and fever.  Respiratory: Negative for shortness of breath.   Cardiovascular: Negative for chest pain.  Gastrointestinal: Negative for abdominal pain, constipation, diarrhea, nausea and vomiting.  Genitourinary: Negative for dysuria, frequency and urgency.  All other systems reviewed and are negative.   Objective: Vital signs in last 24 hours: Temp:  [97.4 F (36.3 C)-98.3 F (36.8 C)] 98.2 F (36.8 C) (02/01 0535) Pulse Rate:  [61-69] 69 (02/01 0535) Resp:  [17] 17 (02/01 0535) BP: (124-127)/(57-69) 124/57 (02/01 0535) SpO2:  [96 %-99 %] 98 % (02/01 0535) Last BM Date: 01/02/20  Intake/Output from previous day: 01/31 0701 - 02/01 0700 In: 855 [P.O.:840] Out: 1485 [Urine:1350; Drains:135] Intake/Output this shift: No intake/output data recorded.  PE: General: pleasant, WD, WN white female who is laying in bed in NAD HEENT: Sclera are noninjected.  PERRL.  Ears and nose without any masses or lesions.  Mouth is pink and moist Heart: regular, rate, and rhythm.  Normal s1,s2. No obvious murmurs, gallops, or rubs noted.  Palpable radial and pedal pulses bilaterally Lungs: CTAB, no wheezes, rhonchi, or rales noted.  Respiratory effort nonlabored Abd: soft, NT, ND, +BS, drain in RUQ with minimal fluid MS: all 4 extremities are symmetrical with no cyanosis, clubbing, or edema. Skin: warm and dry with no masses, lesions, or rashes Neuro: Cranial nerves 2-12 grossly intact, speech is normal Psych: A&Ox3 with an appropriate affect.  Lab Results:  Recent Labs    01/02/20 0413 01/03/20 0636  WBC 7.6 8.7  HGB 10.3* 10.7*  HCT 32.3* 33.5*  PLT 219 266    BMET Recent Labs    01/02/20 0413 01/03/20 0636  NA 140 141  K 3.8 3.8  CL 111 106  CO2 18* 23  GLUCOSE 119* 119*  BUN 20 13  CREATININE 0.67 0.65  CALCIUM 8.4* 8.8*   PT/INR No results for input(s): LABPROT, INR in the last 72 hours. CMP     Component Value Date/Time   NA 141 01/03/2020 0636   K 3.8 01/03/2020 0636   CL 106 01/03/2020 0636   CO2 23 01/03/2020 0636   GLUCOSE 119 (H) 01/03/2020 0636   BUN 13 01/03/2020 0636   CREATININE 0.65 01/03/2020 0636   CREATININE 0.76 08/28/2015 1025   CALCIUM 8.8 (L) 01/03/2020 0636   PROT 5.9 (L) 01/03/2020 0636   PROT 6.9 05/25/2018 0955   ALBUMIN 2.5 (L) 01/03/2020 0636   ALBUMIN 4.2 05/25/2018 0955   AST 23 01/03/2020 0636   ALT 25 01/03/2020 0636   ALKPHOS 61 01/03/2020 0636   BILITOT 0.8 01/03/2020 0636   BILITOT 0.4 05/25/2018 0955   GFRNONAA >60 01/03/2020 0636   GFRAA >60 01/03/2020 0636   Lipase     Component Value Date/Time   LIPASE 16 12/28/2019 2330       Studies/Results: No results found.  Anti-infectives: Anti-infectives (From admission, onward)   Start     Dose/Rate Route Frequency Ordered Stop   01/02/20 2200  amoxicillin-clavulanate (AUGMENTIN) 875-125 MG per tablet 1 tablet  Status:  Discontinued     1 tablet Oral Every 12 hours 01/02/20 1139 01/02/20 1344   01/02/20 1700  cephALEXin (KEFLEX) capsule  500 mg     500 mg Oral 4 times daily 01/02/20 1345 01/09/20 1659   12/29/19 1400  piperacillin-tazobactam (ZOSYN) IVPB 3.375 g  Status:  Discontinued     3.375 g 12.5 mL/hr over 240 Minutes Intravenous Every 8 hours 12/29/19 1136 01/02/20 1139   12/29/19 1200  cefTRIAXone (ROCEPHIN) 2 g in sodium chloride 0.9 % 100 mL IVPB  Status:  Discontinued     2 g 200 mL/hr over 30 Minutes Intravenous Every 24 hours 12/29/19 0623 12/29/19 1136   12/29/19 1000  metroNIDAZOLE (FLAGYL) IVPB 500 mg  Status:  Discontinued     500 mg 100 mL/hr over 60 Minutes Intravenous Every 8 hours 12/29/19 0623 12/29/19  1136   12/29/19 0415  metroNIDAZOLE (FLAGYL) IVPB 500 mg     500 mg 100 mL/hr over 60 Minutes Intravenous  Once 12/29/19 0401 12/29/19 0510   12/29/19 0245  cefTRIAXone (ROCEPHIN) 1 g in sodium chloride 0.9 % 100 mL IVPB     1 g 200 mL/hr over 30 Minutes Intravenous  Once 12/29/19 0231 12/29/19 0346       Assessment/Plan GERD HTN HLD PVD on plavix - followed by Dr. Gwenlyn Found, hold plavix COPD Pre-DM - on metformin. A1c 6.1 (10/2019) Hypothyroidism RLS Code status DNR  Recurrent UTIs UTI - Ucx pending, per primary  Abdominal pain/nausea Acute cholecystitis  - s/p IR drain 1/28 - grew out Klebsiella pneumoniae, pt on appropriate PO abx - WBC 8.7, afebrile, abdominal exam benign - stable for discharge from surgical standpoint - will need follow up in IR drain clinic and will need follow up with [place] in 6-8 weeks  FEN: reg diet VTE: SCDs, plavix ID: keflex PO x7 days Follow up: PCP, IR, CCS   LOS: 5 days    Brigid Re , [facility] 01/03/2020, 9:04 AM Please see Amion for pager number during day hours 7:00am-4:30pm

## 2020-01-03 NOTE — Care Management (Addendum)
Patient can go to [facility]) , nurse to call report to [phone].PTAR called. Patient and son aware. Patient states she has medications in main pharmacy, bedside nurse [facility] aware. Ronny Flurry RN

## 2020-01-03 NOTE — Progress Notes (Signed)
Brief cardiology note:  Patient found to have new onset rate controlled atrial fibrillation this admission. Discussed with Dr. Waymon Amato, reviewed prior notes and studies from Dr. Allyson Sabal.   Does not have a recent history of intervention for PAD, was on DAPT prior to admission.  Given age, weight, would recommend dropping aspirin, keeping clopidogrel, and starting apixaban 5 mg BID for stroke prevention for atrial fibrillation. Chadsvasc at least 4. She does not have an echo in our system, recommend ordering as outpatient.  I will ask to have her set up for a follow up appt with Dr. Allyson Sabal or an APP on his team within 2-3 weeks. Drain to be left in place for about 8 weeks, watch for bleeding in drain.  Jodelle Red, MD, PhD [facility]  [address] [place], [place] [address] [phone]

## 2020-01-03 NOTE — TOC Progression Note (Addendum)
Transition of Care [facility]) - Progression Note    Patient Details  Name: Cheryl Atkinson MRN: [id number] Date of Birth: 05-Dec-1930  Transition of Care [facility]) CM/SW Contact  Tavares Levinson, Adria Devon, RN Phone Number: 01/03/2020, 9:59 AM  Clinical Narrative:     [facility] at [facility] to confirm they are able to take patient today. Awaiting call back.  Tracey with [facility] returned call. She can accept patient today. MD working on discharge summary and will sign DNR form after meeting.  Patient's son in room and aware no visitors allowed at [facility].   Expected Discharge Plan: Skilled Nursing Facility Barriers to Discharge: Continued Medical Work up  Expected Discharge Plan and Services Expected Discharge Plan: Skilled Nursing Facility   Discharge Planning Services: CM Consult Post Acute Care Choice: Skilled Nursing Facility Living arrangements for the past 2 months: Single Family Home                 DME Arranged: N/A                     Social Determinants of Health (SDOH) Interventions    Readmission Risk Interventions No flowsheet data found.

## 2020-01-03 NOTE — Progress Notes (Signed)
Referring Physician(s): Martin,M  Supervising Physician: Malachy Moan  Patient Status:  [facility] - In-pt  Chief Complaint: cholecystitis   Subjective: Pt doing ok this am; denies worsening abd pain,N/V; cards following for rate controlled afib as well; plans noted for D/C to [facility] place today   Allergies: Codeine  Medications: Prior to Admission medications   Medication Sig Start Date End Date Taking? Authorizing Provider  Albuterol Sulfate (PROAIR RESPICLICK) 108 (90 Base) MCG/ACT AEPB Inhale 2 puffs into the lungs every 6 (six) hours as needed (shortness of breath from COPD). 12/29/18  Yes Shelva Majestic, MD  aspirin EC 81 MG EC tablet Take 1 tablet (81 mg total) by mouth daily. 05/05/15  Yes Kilroy, Luke K, PA-C  atorvastatin (LIPITOR) 40 MG tablet Take 1 tablet (40 mg total) by mouth daily. 10/07/19  Yes Shelva Majestic, MD  clopidogrel (PLAVIX) 75 MG tablet Take 1 tablet (75 mg total) by mouth daily with breakfast. MUST KEEP APPOINTMENT 07/20/19 WITH DR Allyson Sabal FOR FUTURE REFILLS 06/11/19  Yes Runell Gess, MD  metFORMIN (GLUCOPHAGE) 500 MG tablet TAKE 1 (1) TABLET BY MOUTH DAILY Patient taking differently: Take 500 mg by mouth daily with breakfast.  10/05/19  Yes Shelva Majestic, MD  Multiple Vitamins-Minerals (PRESERVISION AREDS 2 PO) Take 1 tablet by mouth 2 (two) times daily.   Yes [provider]  Olmesartan-amLODIPine-HCTZ (TRIBENZOR) 40-10-12.5 MG TABS Take 1 tablet by mouth every morning. Patient taking differently: Take 1 tablet by mouth daily.  10/05/19  Yes Shelva Majestic, MD  polyethylene glycol [facility] Ethelene Hal) packet Take 17 g by mouth at bedtime as needed for mild constipation. Reported on 03/14/2016   Yes [provider]  Rotigotine 3 MG/24HR PT24 Place 3 mg onto the skin at bedtime. 03/31/19  Yes Shelva Majestic, MD     Vital Signs: BP (!) 124/57 (BP Location: Left Arm)   Pulse 69   Temp 98.2 F (36.8 C)   Resp 17   Ht  5\' 6"  (1.676 m)   Wt 143 lb 15.4 oz (65.3 kg)   LMP  (LMP Unknown)   SpO2 98%   BMI 23.24 kg/m   Physical Exam awake/alert; GB drain intact, dressing dry, site NT, OP 135 cc bile  Imaging: IR PATIENT EVAL TECH 0-60 MINS  Result Date: 12/30/2019 01/01/2020     12/30/2019  2:44 PM Dr. 01/01/2020 Lowella Dandy pt and determined that procedure would be safer to perform in CT  CT IMAGE GUIDED DRAINAGE BY PERCUTANEOUS CATHETER  Result Date: 12/30/2019 INDICATION: 86 year old with acute calculus cholecystitis. Patient needs a percutaneous cholecystostomy tube. EXAM: CT-GUIDED CHOLECYSTOSTOMY TUBE PLACEMENT MEDICATIONS: Moderate sedation ANESTHESIA/SEDATION: Moderate (conscious) sedation was employed during this procedure. A total of Versed 1.5 mg and Fentanyl 75 mcg was administered intravenously. Moderate Sedation Time: 25 minutes. The patient's level of consciousness and vital signs were monitored continuously by radiology nursing throughout the procedure under my direct supervision. FLUOROSCOPY TIME:  None COMPLICATIONS: None immediate. PROCEDURE: Informed written consent was obtained from the patient after a thorough discussion of the procedural risks, benefits and alternatives. All questions were addressed. The gallbladder was evaluated with ultrasound. Large distended abnormal gallbladder was identified but a large amount of surrounding bowel gas was also identified. Due to the overlying bowel gas, the procedure was performed with CT. Patient was placed supine on CT scanner and CT images were obtained. A timeout was performed prior to the initiation of the procedure. Right upper  abdomen was prepped with chlorhexidine and sterile field was created. Maximal barrier sterile technique was utilized including caps, mask, sterile gowns, sterile gloves, sterile drape, hand hygiene and skin antiseptic. Skin and soft tissues were anesthetized with 1% lidocaine. Using CT guidance, an 18 gauge trocar needle was directed into  the gallbladder. Yellow bilious fluid was rapidly draining from the 18 gauge needle. Stiff Amplatz wire was advanced into the gallbladder and the tract was dilated to accommodate a 10.2 [place] multipurpose drain. 130 mL of yellowish bile was removed. There was some sediment within the aspirated bile. Fluid sample was sent for culture. Follow up CT images were obtained. Catheter was sutured to skin and attached to a gravity bag. FINDINGS: Markedly distended gallbladder with surrounding inflammatory changes. 10.2 French drain was successfully placed within the gallbladder. Gallbladder appears to be decompressed at the end of the procedure. IMPRESSION: CT-guided cholecystostomy tube placement. Electronically Signed   By: Markus Daft M.D.   On: 12/30/2019 17:13    Labs:  CBC: Recent Labs    12/31/19 0613 01/01/20 0340 01/02/20 0413 01/03/20 0636  WBC 14.5* 9.0 7.6 8.7  HGB 10.6* 10.2* 10.3* 10.7*  HCT 33.1* 30.8* 32.3* 33.5*  PLT 198 202 219 266    COAGS: Recent Labs    12/30/19 1210  INR 1.4*    BMP: Recent Labs    12/31/19 0613 01/01/20 0340 01/02/20 0413 01/03/20 0636  NA 140 139 140 141  K 4.4 3.7 3.8 3.8  CL 112* 110 111 106  CO2 21* 20* 18* 23  GLUCOSE 117* 123* 119* 119*  BUN 24* 30* 20 13  CALCIUM 8.2* 8.1* 8.4* 8.8*  CREATININE 1.04* 0.88 0.67 0.65  GFRNONAA 49* 59* >60 >60  GFRAA 56* >60 >60 >60    LIVER FUNCTION TESTS: Recent Labs    12/30/19 0235 01/01/20 0340 01/02/20 0413 01/03/20 0636  BILITOT 1.0 0.8 0.5 0.8  AST 20 17 33 23  ALT 16 16 26 25   ALKPHOS 83 63 54 61  PROT 6.0* 5.6* 5.7* 5.9*  ALBUMIN 2.6* 2.2* 2.4* 2.5*    Assessment and Plan: Pt with hx acute calculus cholecystitis, s/p perc cholecystostomy 1/28; afebrile; WBC nl; hgb stable; creat nl; bile cx- few klebsiella; GB drain will need to remain in place at least 4-6 weeks unless GB removed surgically; will plan f/u injection study in IR clinic ([phone]) 7-8 weeks from initial  placement; as OP rec once daily flush of drain with 5 cc sterile NS, output recording and dressing changes every 1-2 days   Electronically Signed: D. Rowe Robert, PA-C 01/03/2020, 11:41 AM   I spent a total of 15 minutes at the the patient's bedside AND on the patient's hospital floor or unit, greater than 50% of which was counseling/coordinating care for gallbladder drain    Patient ID: Cheryl Atkinson, female   DOB: [birth year], 86 y.o.   MRN: [id number]

## 2020-01-03 NOTE — Progress Notes (Signed)
ANTICOAGULATION CONSULT NOTE - Initial Consult  Pharmacy Consult for apixaban Indication: non-valvular atrial fibrillation  Allergies  Allergen Reactions  . Codeine Other (See Comments)    REACTION: Syncope     Patient Measurements: Height: 5\' 6"  (167.6 cm) Weight: 143 lb 15.4 oz (65.3 kg) IBW/kg (Calculated) : 59.3   Vital Signs: Temp: 98.2 F (36.8 C) (02/01 0535) BP: 124/57 (02/01 0535) Pulse Rate: 69 (02/01 0535)  Labs: Recent Labs    01/01/20 0340 01/01/20 0340 01/02/20 0413 01/03/20 0636  HGB 10.2*   < > 10.3* 10.7*  HCT 30.8*  --  32.3* 33.5*  PLT 202  --  219 266  CREATININE 0.88  --  0.67 0.65   < > = values in this interval not displayed.    Estimated Creatinine Clearance: 47.3 mL/min (by C-G formula based on SCr of 0.65 mg/dL).   Medical History: Past Medical History:  Diagnosis Date  . Anemia   . Arthritis    "shoulders" (05/04/2015)  . Cellulitis of right lower extremity 11/27/2013  . Chronic lower back pain   . Constipation   . GERD (gastroesophageal reflux disease)   . History of hiatal hernia   . Hypertension   . Osteoporosis   . Peripheral arterial disease (HCC)   . Restless leg syndrome   . Thyroid disease     Assessment: 86 yo female with new onset rate controlled afib to start apixaban per cardiology fopr CHADS2VASC score of 4. Patient to remain on plavix and d/c ASA.   Goal of Therapy:  Prevention of CVA Monitor platelets by anticoagulation protocol: Yes   Plan:  Apixaban 5mg  PO BID to start in AM on 01/04/20 per MD request Patient to be educated prior to discharge..   Emmamae Mcnamara A. , PharmD, BCPS, FNKF Clinical Pharmacist [place] Please utilize Amion for appropriate phone number to reach the unit pharmacist [facility])   01/03/2020,11:51 AM

## 2020-01-03 NOTE — Discharge Summary (Addendum)
Physician Discharge Summary  RUTHETTA KOOPMANN ZOX:[id number] DOB: March 03, 1931  PCP: Shelva Majestic, MD  Admitted from: Home Discharged to: SNF  Admit date: 12/28/2019 Discharge date: 01/03/2020  Recommendations for Outpatient Follow-up:   Follow-up Information    Richarda Overlie, MD Follow up in 7 week(s).   Specialties: Interventional Radiology, Radiology Why: she will hear from scheduler from IR Clinic for follow up with Dr Lowella Dandy; flush drain 5 cc daily; record OP; call [phone] if questions Contact information: [address] [place] [address] [phone]        Violeta Gelinas, MD. Call.   Specialty: General Surgery Why: Call and schedule follow up appointment in 8 weeks.  Contact information: [address] [place] [address] [phone]        MD at SNF. Schedule an appointment as soon as possible for a visit.   Why: To be seen in 2 to 3 days with repeat labs (CBC & CMP).       Shelva Majestic, MD. Schedule an appointment as soon as possible for a visit.   Specialty: Family Medicine Why: Upon discharge from SNF. Contact information: [address] [place] [address] [phone]        Runell Gess, MD. Schedule an appointment as soon as possible for a visit in 2 week(s).   Specialties: Cardiology, Radiology Contact information: [address] [place] [address] [phone]            Home Health: N/A Equipment/Devices: TBD at SNF  Discharge Condition: Improved and stable. CODE STATUS: DNR. Diet recommendation: Heart healthy & diabetic diet.  Discharge Diagnoses:  Principal Problem:   Acute cholecystitis Active Problems:   Atherosclerotic PVD with intermittent claudication (HCC)   UTI (urinary tract infection)   Adrenal nodule (HCC)   Abdominal aortic ectasia [facility])   Brief Summary: 86 year old female, lives with her son and ambulates with the help of a cane or walker  (only when she goes out of her house), PMH of anemia, chronic low back pain, constipation, GERD, hiatal hernia, hypertension, PAD, hyperlipidemia, prediabetes, presented to the ED on 1/27 due to right lower quadrant abdominal pain and nausea.  Clinical picture and imaging consistent with acute cholecystitis.  General surgery consulted, not a good surgical candidate, hence IR placed percutaneous cholecystostomy 1/28.    Assessment & Plan:   Acute cholecystitis/Klebsiella pneumoniae: On admission patient was febrile at 100.6 F.  She was not tachycardic, hypotensive or hypoxic.  Did not meet sepsis criteria on admission.  WBC 14.2 and lactate 2.1.  Lipase and LFTs normal.  CT abdomen 1/27: Markedly hydropic gallbladder with extensive pericholecystic inflammation and marked intra and extrahepatic biliary ductal dilatation to the level of the pancreatic head.  Findings compatible with acute cholecystitis.  No visible gallstones.  Treated conservatively with bowel rest/NPO, IV fluids, IV ceftriaxone and metronidazole, pain management.  General surgery was consulted, got MRCP which confirmed acute cholecystitis.  Probable calculus lodged within the neck of the gallbladder.  Dilatation of the common hepatic duct and common bile duct without obstructing lesion.  No evidence of pancreatitis.  CCS consulted IR who did CT-guided percutaneous cholecystostomy on 1/28 because patient is a poor surgical candidate.  Continue drain management per IR.  IR follow-up appreciated, drain to remain in place for 8 weeks, outpatient follow-up with drain clinic and [facility].  I discussed with Dr. Daphine Deutscher, [facility] and per recommendation changed Zosyn to Augmentin x7 more days.  However biliary fluid culture came back positive for Klebsiella pneumonia, resistant to ampicillin and Augmentin and hence as discussed with pharmacy, placed on Keflex for 7 days.  General surgery and IR have seen today, have arranged outpatient follow-up.  Acute  cystitis: CT abdomen 1/27 had findings suggestive of cystitis.  Urine microscopy showed many bacteria and pyuria.  Unfortunately it appears that urine culture was never sent.  Completed treatment for this indication.  Indeterminate 1.4 cm left adrenal nodule: Radiology recommends further characterization with adrenal mass protocol CT or MRI which can be done as outpatient once acute issues have resolved.  Recommend outpatient follow-up as deemed necessary.  Infrarenal abdominal aortic focal fusiform ectasia 2.5 cm diameter: As per radiology, at risk for aneurysmal development and recommend follow-up by ultrasound in 5 years.  Outpatient follow-up as deemed necessary.  Essential hypertension: Controlled.  Resume prior home Tribenzor and discharge.  Atherosclerotic PAD with intermittent claudication: Followed by Dr. Allyson Sabal with Cardiology.  Aspirin and Plavix had been held for procedures, completed.  Resume statins.  As discussed with cardiology, since starting Eliquis from tomorrow, discontinued aspirin but will continue Plavix.  Prediabetes: A1c 6.1 in 10/2019.  Takes Metformin at home, currently on hold.  Well-controlled on NovoLog SSI.  Resumed Metformin at discharge.  Hypokalemia: Replaced.  Acute kidney injury: Mild.  Resolved.  Anemia, suspect of chronic disease: Stable.  Constipation: Initiated bowel regimen but still has not had a BM, passing flatus.  Trial of Dulcolax suppository yesterday and had small BM.  Continue MiraLAX at discharge.  Paroxysmal atrial fibrillation: Not sure if this is old or new for her.  Controlled ventricular rate at this time. EKG confirmed A. fib.  I discussed in detail with cardiologist on-call as well as patient's primary cardiologist today.  Her chart was reviewed.  A. fib is new for her.  It was recommended to stop aspirin, continue Plavix and start Eliquis beginning tomorrow for stroke prevention.  I discussed in detail with patient, no history of  frequent falls (has had 2 falls in the last 6 months, the last one was a month ago with no injuries) and no bleeding issues.  Risks and benefits of anticoagulation were discussed with her including available options i.e. Coumadin, Xarelto, Eliquis and Pradaxa.  She prefers Eliquis which will be started tomorrow.  Outpatient follow-up with Dr. Gery Pray in 2 to 3 weeks.  Physical deconditioning: Secondary to advanced age and acute illness complicating multiple comorbidities.  Patient was unable to ambulate yesterday.  She has changed her mind and agrees to SNF.    Patient will be going to SNF for rehab today.  Body mass index is 23.24 kg/m.   Consultants:   General surgery Interventional radiology  Procedures:   CT-guided percutaneous cholecystostomy 1/28  Discharge Instructions  Discharge Instructions    Call MD for:  difficulty breathing, headache or visual disturbances   Complete by: As directed    Call MD for:  extreme fatigue   Complete by: As directed    Call MD for:  persistant dizziness or light-headedness   Complete by: As directed    Call MD for:  persistant nausea and vomiting   Complete by: As directed    Call MD for:  redness, tenderness, or signs of infection (pain, swelling, redness, odor or green/yellow discharge around incision site)   Complete by: As directed    Call MD for:  severe uncontrolled pain   Complete by: As directed    Call MD for:  temperature >100.4   Complete by: As directed    Diet - low sodium heart healthy   Complete by: As directed    Diet Carb Modified   Complete by: As directed    Increase activity slowly   Complete by: As directed        Medication List    STOP taking these medications   aspirin 81 MG EC tablet     TAKE these medications   acetaminophen 325 MG tablet Commonly known as: TYLENOL Take 2 tablets (650 mg total) by mouth every 6 (six) hours as needed for mild pain, moderate pain or fever (or Fever >/= 101).    Albuterol Sulfate 108 (90 Base) MCG/ACT Aepb Commonly known as: ProAir RespiClick Inhale 2 puffs into the lungs every 6 (six) hours as needed (shortness of breath from COPD).   apixaban 5 MG Tabs tablet Commonly known as: ELIQUIS Take 1 tablet (5 mg total) by mouth 2 (two) times daily. Start 01/04/2020. Start taking on: January 04, 2020   atorvastatin 40 MG tablet Commonly known as: LIPITOR Take 1 tablet (40 mg total) by mouth daily.   cephALEXin 500 MG capsule Commonly known as: KEFLEX Take 1 capsule (500 mg total) by mouth in the morning, at noon, in the evening, and at bedtime. Discontinue after 01/08/2020 doses.   clopidogrel 75 MG tablet Commonly known as: PLAVIX Take 1 tablet (75 mg total) by mouth daily with breakfast. MUST KEEP APPOINTMENT 07/20/19 WITH DR Allyson Sabal FOR FUTURE REFILLS   metFORMIN 500 MG tablet Commonly known as: GLUCOPHAGE TAKE 1 (1) TABLET BY MOUTH DAILY What changed:   how much to take  how to take this  when to take this  additional instructions   Olmesartan-amLODIPine-HCTZ 40-10-12.5 MG Tabs Commonly known as: Tribenzor Take 1 tablet by mouth every morning. What changed: when to take this   polyethylene glycol 17 g packet Commonly known as: MIRALAX / GLYCOLAX Take 17 g by mouth 2 (two) times daily. Reported on 03/14/2016 What changed:   when to take this  reasons to take this   PRESERVISION AREDS 2 PO Take 1 tablet by mouth 2 (two) times daily.   Rotigotine 3 MG/24HR Pt24 Place 3 mg onto the skin at bedtime.   sodium chloride flush 0.9 % Soln Commonly known as: NS 5 mLs by Intracatheter route every 8 (eight) hours.      Allergies  Allergen Reactions  . Codeine Other (See Comments)    REACTION: Syncope       Procedures/Studies: CT ABDOMEN PELVIS W CONTRAST  Result Date: 12/29/2019 CLINICAL DATA:  Sharp constant right lower quadrant abdominal pain with nausea and constipation EXAM: CT ABDOMEN AND PELVIS WITH CONTRAST TECHNIQUE:  Multidetector CT imaging of the abdomen and pelvis was performed using the standard protocol following bolus administration of intravenous contrast. CONTRAST:  OMNIPAQUE IOHEXOL 300 MG/ML  SOLN COMPARISON:  Radiograph Apr 15, 2011, CT lumbar spine January 04, 2011 FINDINGS: Lower chest: Bandlike areas of atelectasis in the lung base with more dependent atelectasis in the posterior right lower lobe. Mild cardiomegaly with coronary artery atherosclerosis. No pericardial effusion. Hepatobiliary: No focal liver lesions are identified. Gallbladder is markedly hydropic mural thickening. There is extensive pericholecystic inflammation with mucosal hyperemia and marked intra and extrahepatic biliary ductal dilatation to the level of the pancreatic head. Inflammation extends inferolaterally to the gallbladder fossa into the right pericolic gutter as well as with stranding and phlegmon about the proximal duodenal sweep likely secondary to  the gallbladder process. Pancreas: Pancreas is largely atrophic. No pancreatic ductal dilatation. Spleen: Normal in size without focal abnormality. Adrenals/Urinary Tract: 1.4 cm nodule in the body of the left adrenal gland is nonspecific. None no right adrenal nodules. 1 cm fluid attenuation cyst seen in the interpolar left kidney. No worrisome renal lesions. No hydronephrosis or urolithiasis. Urinary bladder appears circumferentially thickened with multiple bladder diverticula seen along the right lateral aspect of the bladder. Some hazy stranding is notable about these multiple bladder diverticula. Stomach/Bowel: Distal esophagus and stomach are unremarkable. Stranding adjacent the proximal duodenum is likely secondary to the gallbladder. No small bowel dilatation or wall thickening. Cecum is slightly displaced into the midline abdomen. The appendix is surgically absent. No colonic dilatation or wall thickening. Scattered colonic diverticula without focal pericolonic inflammation to  suggest diverticulitis. Vascular/Lymphatic: Atherosclerotic plaque within the normal caliber aorta. Focal fusiform ectasia of the infrarenal abdominal aorta measuring up to 2.5 cm in diameter. Reactive adenopathy in the upper abdomen. No pathologically enlarged lymph nodes in the abdomen or pelvis. Reproductive: Uterus is surgically absent. No concerning adnexal lesions. Other: No abdominopelvic free fluid or free gas. Stranding and inflammation extends from the gallbladder to the right paracolic gutter. Additional hazy stranding is noted upon the multiple bladder diverticula in the pelvis. No bowel containing hernias. Musculoskeletal: The osseous structures appear diffusely demineralized which may limit detection of small or nondisplaced fractures. Multilevel degenerative changes are present in the imaged portions of the spine. 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 No acute osseous abnormality or suspicious osseous lesion. IMPRESSION: 1. Markedly hydropic gallbladder with extensive pericholecystic inflammation and marked intra and extrahepatic biliary ductal dilatation to the level of the pancreatic head. No aggressive or invasive features are seen. Findings are compatible with acute cholecystitis. 2. Extensive intra and extrahepatic biliary ductal dilatation with acute tapering at the level of the pancreatic head. No visible gallstones. Could consider right upper quadrant ultrasound or MRCP for further evaluation. 3. Urinary bladder appears circumferentially thickened with multiple bladder diverticula seen along the right lateral aspect of the bladder. Some hazy stranding is noted about these bladder diverticula. Findings are suggestive of cystitis. Correlate with urinalysis. 4. Indeterminate 1.4 cm nodule in the body of the left adrenal gland. If there is no history of malignancy, further characterization could be performed with adrenal mass protocol CT  or MRI. This recommendation follows ACR consensus guidelines: Management of Incidental Adrenal Masses: A White Paper of the ACR Incidental Findings Committee. J Am Coll Radiol 2017;14:1038-1044. 5. Focal fusiform ectasia of the infrarenal abdominal aorta measuring up to 2.5 cm in diameter. Ectatic abdominal aorta at risk for aneurysm development. Recommend followup by ultrasound in 5 years. This recommendation follows ACR consensus guidelines: White Paper of the ACR Incidental Findings Committee II on Vascular Findings. J Am Coll Radiol 2013; 10:789-794. Aortic aneurysm NOS (ICD10-I71.9) 6.  Aortic Atherosclerosis (ICD10-I70.0). These results were called by telephone at the time of interpretation on 12/29/2019 at 4:03 am to provider Dr Clayborne DanaMesner, who verbally acknowledged these results. Electronically Signed   By: Kreg ShropshirePrice  DeHay M.D.   On: 12/29/2019 04:03   MR 3D Recon At Scanner  Result Date: 12/30/2019 CLINICAL DATA:  RIGHT upper quadrant pain. Cholecystitis suspected on CT 1 day prior. EXAM: MRI ABDOMEN WITHOUT AND WITH CONTRAST (INCLUDING MRCP) TECHNIQUE: Multiplanar multisequence MR imaging of the abdomen was performed both before and after the administration of intravenous contrast. Heavily T2-weighted images of the biliary and pancreatic  ducts were obtained, and three-dimensional MRCP images were rendered by post processing. CONTRAST:  6.68mL GADAVIST GADOBUTROL 1 MMOL/ML IV SOLN COMPARISON:  CT 12/29/2019 FINDINGS: Lower chest: Small bilateral pleural effusions. Hepatobiliary: The gallbladder is markedly distended to 5.7 cm. There is extensive gallbladder wall thickening and pericholecystic fluid. Multiple gallstones layer dependently within the fundus of the gallbladder. Within the neck of the bladder, there is an obstructing calculus measuring approximately 5 mm on image 16/7. This potential calculus is also seen on image 33/13 of the MRCP series. While the common bile duct is dilated to 10 mm, there is no  discrete filling defect within the common bile duct. The ductal dilatation extends the level of the ampulla. There is mild intrahepatic duct dilatation involving the LEFT hepatic lobe. No focal hepatic lesion. Postcontrast imaging is severely degraded by patient respiratory motion. Pancreas: Pancreatic duct is normal caliber. No pancreatic inflammation identified Spleen: Normal spleen Adrenals/urinary tract: Adrenal glands and kidneys are normal. The ureters and bladder normal. Stomach/Bowel: Stomach, small bowel, appendix, and cecum are normal. The colon and rectosigmoid colon are normal. Vascular/Lymphatic: Abdominal aorta is normal caliber. No periportal or retroperitoneal adenopathy. No pelvic adenopathy. Reproductive: Other: No free fluid. Musculoskeletal: No aggressive osseous lesion. IMPRESSION: 1. Acute cholecystitis. Probable calculus lodged within the neck of the gallbladder. 2. Dilatation of the common hepatic duct and common bile duct without obstructing lesion identified. 3. Minimal intrahepatic duct dilatation. 4. No pancreatic duct dilatation.  No evidence of pancreatitis. Electronically Signed   By: Genevive Bi M.D.   On: 12/30/2019 07:53   MR ABDOMEN MRCP W WO CONTAST  Result Date: 12/30/2019 CLINICAL DATA:  RIGHT upper quadrant pain. Cholecystitis suspected on CT 1 day prior. EXAM: MRI ABDOMEN WITHOUT AND WITH CONTRAST (INCLUDING MRCP) TECHNIQUE: Multiplanar multisequence MR imaging of the abdomen was performed both before and after the administration of intravenous contrast. Heavily T2-weighted images of the biliary and pancreatic ducts were obtained, and three-dimensional MRCP images were rendered by post processing. CONTRAST:  6.39mL GADAVIST GADOBUTROL 1 MMOL/ML IV SOLN COMPARISON:  CT 12/29/2019 FINDINGS: Lower chest: Small bilateral pleural effusions. Hepatobiliary: The gallbladder is markedly distended to 5.7 cm. There is extensive gallbladder wall thickening and pericholecystic  fluid. Multiple gallstones layer dependently within the fundus of the gallbladder. Within the neck of the bladder, there is an obstructing calculus measuring approximately 5 mm on image 16/7. This potential calculus is also seen on image 33/13 of the MRCP series. While the common bile duct is dilated to 10 mm, there is no discrete filling defect within the common bile duct. The ductal dilatation extends the level of the ampulla. There is mild intrahepatic duct dilatation involving the LEFT hepatic lobe. No focal hepatic lesion. Postcontrast imaging is severely degraded by patient respiratory motion. Pancreas: Pancreatic duct is normal caliber. No pancreatic inflammation identified Spleen: Normal spleen Adrenals/urinary tract: Adrenal glands and kidneys are normal. The ureters and bladder normal. Stomach/Bowel: Stomach, small bowel, appendix, and cecum are normal. The colon and rectosigmoid colon are normal. Vascular/Lymphatic: Abdominal aorta is normal caliber. No periportal or retroperitoneal adenopathy. No pelvic adenopathy. Reproductive: Other: No free fluid. Musculoskeletal: No aggressive osseous lesion. IMPRESSION: 1. Acute cholecystitis. Probable calculus lodged within the neck of the gallbladder. 2. Dilatation of the common hepatic duct and common bile duct without obstructing lesion identified. 3. Minimal intrahepatic duct dilatation. 4. No pancreatic duct dilatation.  No evidence of pancreatitis. Electronically Signed   By: Genevive Bi M.D.   On: 12/30/2019 07:53  IR PATIENT EVAL TECH 0-60 MINS  Result Date: 12/30/2019 Jacqualine CodeFalls, Heather H     12/30/2019  2:44 PM Dr. Lowella DandyHenn US pt and determined that procedure would be safer to perform in CT  CT IMAGE GUIDED DRAINAGE BY PERCUTANEOUS CATHETER  Result Date: 12/30/2019 INDICATION: 86 year old with acute calculus cholecystitis. Patient needs a percutaneous cholecystostomy tube. EXAM: CT-GUIDED CHOLECYSTOSTOMY TUBE PLACEMENT MEDICATIONS: Moderate sedation  ANESTHESIA/SEDATION: Moderate (conscious) sedation was employed during this procedure. A total of Versed 1.5 mg and Fentanyl 75 mcg was administered intravenously. Moderate Sedation Time: 25 minutes. The patient's level of consciousness and vital signs were monitored continuously by radiology nursing throughout the procedure under my direct supervision. FLUOROSCOPY TIME:  None COMPLICATIONS: None immediate. PROCEDURE: Informed written consent was obtained from the patient after a thorough discussion of the procedural risks, benefits and alternatives. All questions were addressed. The gallbladder was evaluated with ultrasound. Large distended abnormal gallbladder was identified but a large amount of surrounding bowel gas was also identified. Due to the overlying bowel gas, the procedure was performed with CT. Patient was placed supine on CT scanner and CT images were obtained. A timeout was performed prior to the initiation of the procedure. Right upper abdomen was prepped with chlorhexidine and sterile field was created. Maximal barrier sterile technique was utilized including caps, mask, sterile gowns, sterile gloves, sterile drape, hand hygiene and skin antiseptic. Skin and soft tissues were anesthetized with 1% lidocaine. Using CT guidance, an 18 gauge trocar needle was directed into the gallbladder. Yellow bilious fluid was rapidly draining from the 18 gauge needle. Stiff Amplatz wire was advanced into the gallbladder and the tract was dilated to accommodate a 10.2 JamaicaFrench multipurpose drain. 130 mL of yellowish bile was removed. There was some sediment within the aspirated bile. Fluid sample was sent for culture. Follow up CT images were obtained. Catheter was sutured to skin and attached to a gravity bag. FINDINGS: Markedly distended gallbladder with surrounding inflammatory changes. 10.2 French drain was successfully placed within the gallbladder. Gallbladder appears to be decompressed at the end of the  procedure. IMPRESSION: CT-guided cholecystostomy tube placement. Electronically Signed   By: Richarda OverlieAdam  Henn M.D.   On: 12/30/2019 17:13      Subjective: Patient had a small BM last night after Dulcolax suppository.  No abdominal pain.  Tolerating diet.  Denies dyspnea or any other complaints.  Discharge Exam:  Vitals:   01/02/20 0441 01/02/20 1536 01/02/20 2131 01/03/20 0535  BP: 129/60 (!) 126/58 127/69 (!) 124/57  Pulse: 62 61 66 69  Resp: 16 17 17 17   Temp: 98.8 F (37.1 C) 98.3 F (36.8 C) (!) 97.4 F (36.3 C) 98.2 F (36.8 C)  TempSrc: Oral Oral Oral   SpO2: 98% 99% 96% 98%  Weight:      Height:        General exam: Elderly female, moderately built and nourished sitting up comfortably in bed without distress. Respiratory system: Clear to auscultation.  No increased work of breathing. Cardiovascular system: S1 and S2 heard, irregularly irregular.  No JVD, murmurs or pedal edema. Gastrointestinal system: Abdomen minimally distended but soft and without tenderness.  RUQ drain with minimal drainage.  No organomegaly or masses appreciated.  Normal bowel sounds heard. Central nervous system: Alert and oriented. No focal neurological deficits. Extremities: Symmetric 5 x 5 power. Skin: No rashes, lesions or ulcers Psychiatry: Judgement and insight appear normal. Mood & affect appropriate.     The results of significant diagnostics from this hospitalization (  including imaging, microbiology, ancillary and laboratory) are listed below for reference.     Microbiology: Recent Results (from the past 240 hour(s))  Culture, blood (Routine X 2) w Reflex to ID Panel     Status: None   Collection Time: 12/29/19  4:27 AM   Specimen: BLOOD LEFT FOREARM  Result Value Ref Range Status   Specimen Description BLOOD LEFT FOREARM  Final   Special Requests   Final    BOTTLES DRAWN AEROBIC AND ANAEROBIC Blood Culture results may not be optimal due to an inadequate volume of blood received in  culture bottles   Culture   Final    NO GROWTH 5 DAYS Performed at [facility], [place] [address] [place], [place] [address]    Report Status 01/03/2020 FINAL  Final  Respiratory Panel by RT PCR (Flu A&B, Covid) - Nasopharyngeal Swab     Status: None   Collection Time: 12/29/19  5:04 AM   Specimen: Nasopharyngeal Swab  Result Value Ref Range Status   SARS Coronavirus 2 by RT PCR NEGATIVE NEGATIVE Final    Comment: (NOTE) SARS-CoV-2 target nucleic acids are NOT DETECTED. The SARS-CoV-2 RNA is generally detectable in upper respiratoy specimens during the acute phase of infection. The lowest concentration of SARS-CoV-2 viral copies this assay can detect is 131 copies/mL. A negative result does not preclude SARS-Cov-2 infection and should not be used as the sole basis for treatment or other patient management decisions. A negative result may occur with  improper specimen collection/handling, submission of specimen other than nasopharyngeal swab, presence of viral mutation(s) within the areas targeted by this assay, and inadequate number of viral copies (<131 copies/mL). A negative result must be combined with clinical observations, patient history, and epidemiological information. The expected result is Negative. Fact Sheet for Patients:  PinkCheek.be Fact Sheet for Healthcare Providers:  GravelBags.it This test is not yet ap proved or cleared by the [place] FDA and  has been authorized for detection and/or diagnosis of SARS-CoV-2 by FDA under an Emergency Use Authorization (EUA). This EUA will remain  in effect (meaning this test can be used) for the duration of the COVID-19 declaration under Section 564(b)(1) of the Act, 21 U.S.C. section 360bbb-3(b)(1), unless the authorization is terminated or revoked sooner.    Influenza A by PCR NEGATIVE NEGATIVE Final   Influenza B by PCR NEGATIVE NEGATIVE Final    Comment:  (NOTE) The Xpert Xpress SARS-CoV-2/FLU/RSV assay is intended as an aid in  the diagnosis of influenza from Nasopharyngeal swab specimens and  should not be used as a sole basis for treatment. Nasal washings and  aspirates are unacceptable for Xpert Xpress SARS-CoV-2/FLU/RSV  testing. Fact Sheet for Patients: PinkCheek.be Fact Sheet for Healthcare Providers: GravelBags.it This test is not yet approved or cleared by the [place] FDA and  has been authorized for detection and/or diagnosis of SARS-CoV-2 by  FDA under an Emergency Use Authorization (EUA). This EUA will remain  in effect (meaning this test can be used) for the duration of the  Covid-19 declaration under Section 564(b)(1) of the Act, 21  U.S.C. section 360bbb-3(b)(1), unless the authorization is  terminated or revoked. Performed at [facility], [address], [place], [place] [address]   Culture, blood (Routine X 2) w Reflex to ID Panel     Status: None   Collection Time: 12/29/19  7:15 AM   Specimen: BLOOD  Result Value Ref Range Status   Specimen Description BLOOD LEFT  ANTECUBITAL  Final   Special Requests   Final    BOTTLES DRAWN AEROBIC AND ANAEROBIC Blood Culture results may not be optimal due to an excessive volume of blood received in culture bottles   Culture   Final    NO GROWTH 5 DAYS Performed at [facility], [address], [place], [place] [address]    Report Status 01/03/2020 FINAL  Final  Aerobic/Anaerobic Culture (surgical/deep wound)     Status: None (Preliminary result)   Collection Time: 12/30/19  3:26 PM   Specimen: BILE  Result Value Ref Range Status   Specimen Description BILE  Final   Special Requests NONE  Final   Gram Stain   Final    ABUNDANT WBC PRESENT, PREDOMINANTLY PMN NO ORGANISMS SEEN Performed at [facility], [address], [place], [place] [address]    Culture   Final    FEW KLEBSIELLA  PNEUMONIAE NO ANAEROBES ISOLATED; CULTURE IN PROGRESS FOR 5 DAYS    Report Status PENDING  Incomplete   Organism ID, Bacteria KLEBSIELLA PNEUMONIAE  Final      Susceptibility   Klebsiella pneumoniae - MIC*    AMPICILLIN >=32 RESISTANT Resistant     CEFAZOLIN <=4 SENSITIVE Sensitive     CEFEPIME <=0.12 SENSITIVE Sensitive     CEFTAZIDIME <=1 SENSITIVE Sensitive     CEFTRIAXONE <=0.25 SENSITIVE Sensitive     CIPROFLOXACIN <=0.25 SENSITIVE Sensitive     GENTAMICIN <=1 SENSITIVE Sensitive     IMIPENEM <=0.25 SENSITIVE Sensitive     TRIMETH/SULFA <=20 SENSITIVE Sensitive     AMPICILLIN/SULBACTAM >=32 RESISTANT Resistant     PIP/TAZO 16 SENSITIVE Sensitive     * FEW KLEBSIELLA PNEUMONIAE  SARS CORONAVIRUS 2 (TAT 6-24 HRS) Nasopharyngeal Nasopharyngeal Swab     Status: None   Collection Time: 01/02/20  2:07 PM   Specimen: Nasopharyngeal Swab  Result Value Ref Range Status   SARS Coronavirus 2 NEGATIVE NEGATIVE Final    Comment: (NOTE) SARS-CoV-2 target nucleic acids are NOT DETECTED. The SARS-CoV-2 RNA is generally detectable in upper and lower respiratory specimens during the acute phase of infection. Negative results do not preclude SARS-CoV-2 infection, do not rule out co-infections with other pathogens, and should not be used as the sole basis for treatment or other patient management decisions. Negative results must be combined with clinical observations, patient history, and epidemiological information. The expected result is Negative. Fact Sheet for Patients: HairSlick.no Fact Sheet for Healthcare Providers: quierodirigir.com This test is not yet approved or cleared by the [place] FDA and  has been authorized for detection and/or diagnosis of SARS-CoV-2 by FDA under an Emergency Use Authorization (EUA). This EUA will remain  in effect (meaning this test can be used) for the duration of the COVID-19 declaration under  Section 56 4(b)(1) of the Act, 21 U.S.C. section 360bbb-3(b)(1), unless the authorization is terminated or revoked sooner. Performed at [facility], [address], [place], [place] [address]      Labs: CBC: Recent Labs  Lab 12/30/19 0235 12/31/19 5427 01/01/20 0340 01/02/20 0413 01/03/20 0636  WBC 21.5* 14.5* 9.0 7.6 8.7  HGB 11.2* 10.6* 10.2* 10.3* 10.7*  HCT 34.4* 33.1* 30.8* 32.3* 33.5*  MCV 91.2 91.2 90.3 91.5 91.5  PLT 212 198 202 219 266    Basic Metabolic Panel: Recent Labs  Lab 12/30/19 1210 12/31/19 0613 01/01/20 0340 01/02/20 0413 01/03/20 0636  NA 139 140 139 140 141  K 3.4* 4.4 3.7  3.8 3.8  CL 106 112* 110 111 106  CO2 22 21* 20* 18* 23  GLUCOSE 101* 117* 123* 119* 119*  BUN 19 24* 30* 20 13  CREATININE 1.18* 1.04* 0.88 0.67 0.65  CALCIUM 8.1* 8.2* 8.1* 8.4* 8.8*    Liver Function Tests: Recent Labs  Lab 12/28/19 2330 12/30/19 0235 01/01/20 0340 01/02/20 0413 01/03/20 0636  AST 22 20 17  33 23  ALT 16 16 16 26 25   ALKPHOS 97 83 63 54 61  BILITOT 0.9 1.0 0.8 0.5 0.8  PROT 7.2 6.0* 5.6* 5.7* 5.9*  ALBUMIN 3.7 2.6* 2.2* 2.4* 2.5*    CBG: Recent Labs  Lab 01/02/20 1220 01/02/20 1811 01/02/20 2134 01/03/20 0800 01/03/20 1212  GLUCAP 95 114* 130* 121* 112*    Hgb A1c Recent Labs    01/01/20 0340  HGBA1C 5.9*    Urinalysis    Component Value Date/Time   COLORURINE YELLOW 12/28/2019 2317   APPEARANCEUR CLOUDY (A) 12/28/2019 2317   LABSPEC 1.014 12/28/2019 2317   PHURINE 6.0 12/28/2019 2317   GLUCOSEU NEGATIVE 12/28/2019 2317   HGBUR NEGATIVE 12/28/2019 2317   BILIRUBINUR NEGATIVE 12/28/2019 2317   BILIRUBINUR Negative 07/14/2019 1417   KETONESUR NEGATIVE 12/28/2019 2317   PROTEINUR 30 (A) 12/28/2019 2317   UROBILINOGEN 0.2 08/20/2019 1732   NITRITE NEGATIVE 12/28/2019 2317   LEUKOCYTESUR LARGE (A) 12/28/2019 2317      Time coordinating discharge: 45 minutes  SIGNED:  12/30/2019, MD, FACP, [facility]. [facility]  To contact the attending provider between 7A-7P or the covering provider during after hours 7P-7A, please log into the web site [email or web address] and access using universal [place] password for that web site. If you do not have the password, please call the hospital operator.

## 2020-01-03 NOTE — Progress Notes (Signed)
Pt discharged to [facility] this pm.report given to facility nurse

## 2020-01-03 NOTE — Progress Notes (Signed)
Physical Therapy Treatment Patient Details Name: Cheryl Atkinson MRN: [id number] DOB: 15-Jan-1931 Today's Date: 01/03/2020    History of Present Illness Patient is a 86 y/o female who presents with abdominal pain now due to acute cholecystitis s/p cholecystectomy drain placement 1/28. PMH includes PAD, HTN.    PT Comments    Pt making good progress today with gait distance.  Continues to have posterior lean but improved with cues.  Continues to require skilled therapy to advance and provide mod-max cues for gait and transfer technique.  Pt pleased with her progress today.    Follow Up Recommendations  SNF;Supervision for mobility/OOB     Equipment Recommendations  None recommended by PT    Recommendations for Other Services       Precautions / Restrictions Precautions Precautions: Fall Precaution Comments: drain    Mobility  Bed Mobility               General bed mobility comments: in chair at arrival  Transfers Overall transfer level: Needs assistance Equipment used: Rolling walker (2 wheeled) Transfers: Sit to/from Stand Sit to Stand: Mod assist         General transfer comment: Cues to scoot forward, pull knees back, hand placement, and to lean forward to stand.  Pt able to retain 2/4 cues without recues.  Performed sit to stand x 5.  Assist with toielting ADLs  Ambulation/Gait Ambulation/Gait assistance: Min assist Gait Distance (Feet): 14 Feet Assistive device: Rolling walker (2 wheeled) Gait Pattern/deviations: Trunk flexed;Leaning posteriorly;Shuffle Gait velocity: decreased   General Gait Details: 14'x2; cues for RW and balance; posterior lean that improved but still needing min A at times; assist for weight shift; cues for posture   Stairs             Wheelchair Mobility    Modified Rankin (Stroke Patients Only)       Balance Overall balance assessment: Needs assistance Sitting-balance support: Feet supported;Bilateral upper  extremity supported Sitting balance-Leahy Scale: Fair Sitting balance - Comments: required SBA   Standing balance support: During functional activity;Bilateral upper extremity supported Standing balance-Leahy Scale: Poor Standing balance comment: Required use of RW; initially with strong posterior lean requiring mod A but able to advance to min A with cues for posture and more weight on toes; stood several mins prior to walking for balance                            Cognition Arousal/Alertness: Awake/alert Behavior During Therapy: WFL for tasks assessed/performed Overall Cognitive Status: Within Functional Limits for tasks assessed                                 General Comments: did require repetative cues      Exercises      General Comments        Pertinent Vitals/Pain Pain Assessment: No/denies pain    Home Living                      Prior Function            PT Goals (current goals can now be found in the care plan section) Progress towards PT goals: Progressing toward goals    Frequency    Min 3X/week      PT Plan Current plan remains appropriate    Co-evaluation  AM-PAC PT "6 Clicks" Mobility   Outcome Measure  Help needed turning from your back to your side while in a flat bed without using bedrails?: A Lot Help needed moving from lying on your back to sitting on the side of a flat bed without using bedrails?: A Lot Help needed moving to and from a bed to a chair (including a wheelchair)?: A Lot Help needed standing up from a chair using your arms (e.g., wheelchair or bedside chair)?: A Lot Help needed to walk in hospital room?: A Lot Help needed climbing 3-5 steps with a railing? : Total 6 Click Score: 11    End of Session Equipment Utilized During Treatment: Gait belt Activity Tolerance: Patient tolerated treatment well Patient left: in chair;with call bell/phone within reach;with chair  alarm set Nurse Communication: Mobility status PT Visit Diagnosis: Pain;Muscle weakness (generalized) (M62.81);Unsteadiness on feet (R26.81);Difficulty in walking, not elsewhere classified (R26.2)     Time: 1430-1456 PT Time Calculation (min) (ACUTE ONLY): 26 min  Charges:  $Gait Training: 8-22 mins $Therapeutic Activity: 8-22 mins                     Maggie Font, PT Acute Rehab Services Pager [phone] [facility] [phone] [facility] [phone]    Karlton Lemon 01/03/2020, 4:01 PM

## 2020-01-04 ENCOUNTER — Other Ambulatory Visit: Payer: Self-pay | Admitting: General Surgery

## 2020-01-04 DIAGNOSIS — K8000 Calculus of gallbladder with acute cholecystitis without obstruction: Secondary | ICD-10-CM

## 2020-01-04 LAB — AEROBIC/ANAEROBIC CULTURE W GRAM STAIN (SURGICAL/DEEP WOUND)

## 2020-01-06 ENCOUNTER — Other Ambulatory Visit: Payer: Self-pay | Admitting: *Deleted

## 2020-01-06 DIAGNOSIS — N3090 Cystitis, unspecified without hematuria: Secondary | ICD-10-CM | POA: Diagnosis not present

## 2020-01-06 DIAGNOSIS — E278 Other specified disorders of adrenal gland: Secondary | ICD-10-CM | POA: Diagnosis not present

## 2020-01-06 DIAGNOSIS — K819 Cholecystitis, unspecified: Secondary | ICD-10-CM | POA: Diagnosis not present

## 2020-01-06 DIAGNOSIS — I4891 Unspecified atrial fibrillation: Secondary | ICD-10-CM | POA: Diagnosis not present

## 2020-01-06 NOTE — Patient Outreach (Signed)
Screened for potential [facility] Care Management needs as a benefit of  [employer].  Cheryl Atkinson is currently receiving skilled therapy at [facility].   Earlier today, Clinical research associate attended telephonic interdisciplinary team meeting to assess for disposition needs and transition plan for resident.   Facility reports member lived with son prior. She is s/p percutaneous cholecystostomy 1/28. She continues with drain.   Will continue to follow for transition plans and potential [facility] needs. Will plan outreach as appropriate.  Raiford Noble, MSN-Ed, RN,BSN [facility] Post Acute Care Coordinator [phone] [facility]) [phone]  (Toll free office)

## 2020-01-14 ENCOUNTER — Other Ambulatory Visit: Payer: Self-pay

## 2020-01-14 ENCOUNTER — Other Ambulatory Visit (HOSPITAL_COMMUNITY): Payer: Self-pay | Admitting: General Surgery

## 2020-01-14 ENCOUNTER — Ambulatory Visit (HOSPITAL_COMMUNITY)
Admission: RE | Admit: 2020-01-14 | Discharge: 2020-01-14 | Disposition: A | Discharge status: Home / Self Care | Payer: No Typology Code available for payment source | Source: Ambulatory Visit | Attending: General Surgery | Admitting: General Surgery

## 2020-01-14 DIAGNOSIS — K819 Cholecystitis, unspecified: Secondary | ICD-10-CM

## 2020-01-14 DIAGNOSIS — K8020 Calculus of gallbladder without cholecystitis without obstruction: Secondary | ICD-10-CM | POA: Insufficient documentation

## 2020-01-14 MED ORDER — LIDOCAINE HCL 1 % IJ SOLN
INTRAMUSCULAR | Status: AC
  Filled 2020-01-14: qty 20

## 2020-01-17 ENCOUNTER — Other Ambulatory Visit (HOSPITAL_COMMUNITY): Payer: Self-pay | Admitting: General Surgery

## 2020-01-17 DIAGNOSIS — K819 Cholecystitis, unspecified: Secondary | ICD-10-CM

## 2020-01-18 ENCOUNTER — Encounter (HOSPITAL_COMMUNITY): Payer: Self-pay | Admitting: Radiology

## 2020-01-18 NOTE — Progress Notes (Signed)
Asa L. Nolton Female, 86 y.o., 12-Jul-1931 MRN:  [id number] Phone:  [phone] (H) PCP:  Shelva Majestic, MD Primary Cvg:  Medicare/Medicare Part A And B Next Appt With Radiology ([facility] DG 1 ([place])) 02/24/2020 at 2:00 PM  RE: CT Guided Drain Placement Received: Today Message Contents  Runell Gess, MD  Sarajane Marek to hold Eliquis and antiplatelet agents for drain replacement.       Previous Messages   ----- Message -----  From: Henry Russel D  Sent: 01/18/2020 11:35 AM EST  To: Runell Gess, MD  Subject: FW: CT Guided Drain Placement           Dr. Allyson Sabal, patient has a request to have her Drain replaced due to pulling it out. Need to know if it is okay to hold her Eliquis for 2 days prior and her Plavix for 5 days prior to her procedure.   Thanks Rodney Booze  ----- Message -----  From: Henry Russel D  Sent: 01/18/2020 11:29 AM EST  To: Ir Procedure Requests  Subject: CT Guided Drain Placement             Procedure: CT IMAGE GUIDED DRAINAGE BY PERCUTANEOUS CATHETER   Reason: Cholecystitis, unspecified, patient pulled out perc drain that was recently placed, Order for IR to place new drain.   History: CT, MR, IR, previous Drain Placement on 12/30/19   Provider: Violeta Gelinas   Provider Contact: [phone]

## 2020-01-19 ENCOUNTER — Other Ambulatory Visit (HOSPITAL_COMMUNITY): Payer: Self-pay | Admitting: General Surgery

## 2020-01-19 ENCOUNTER — Telehealth (HOSPITAL_COMMUNITY): Payer: Self-pay

## 2020-01-19 DIAGNOSIS — K819 Cholecystitis, unspecified: Secondary | ICD-10-CM

## 2020-01-19 NOTE — Telephone Encounter (Signed)
-----   Message from Servando Salina sent at 01/18/2020  4:43 PM EST ----- Regarding: FW: CT Guided Drain Placement  ----- Message ----- From: Irish Lack, MD Sent: 01/18/2020   4:22 PM EST To: Servando Salina Subject: RE: CT Guided Drain Placement                  This has to be scheduled in IR: New percutaneous cholecystostomy tube placement in IR with conscious sedation.  GY  ----- Message ----- From: Henry Russel D Sent: 01/18/2020  11:29 AM EST To: Ir Procedure Requests Subject: CT Guided Drain Placement                      Procedure:  CT IMAGE GUIDED DRAINAGE BY PERCUTANEOUS CATHETER  Reason:  Cholecystitis, unspecified, patient pulled out perc drain that was recently placed, Order for IR to place new drain.  History:  CT, MR, IR, previous Drain Placement on 12/30/19  Provider:  Violeta Gelinas  Provider Contact:  [phone]

## 2020-01-20 ENCOUNTER — Other Ambulatory Visit: Payer: Self-pay | Admitting: Radiology

## 2020-01-21 ENCOUNTER — Other Ambulatory Visit (HOSPITAL_COMMUNITY): Admission: RE | Admit: 2020-01-21 | Payer: Medicare Other | Source: Ambulatory Visit

## 2020-01-21 DIAGNOSIS — K819 Cholecystitis, unspecified: Secondary | ICD-10-CM | POA: Diagnosis not present

## 2020-01-21 DIAGNOSIS — D649 Anemia, unspecified: Secondary | ICD-10-CM | POA: Diagnosis not present

## 2020-01-21 DIAGNOSIS — I4891 Unspecified atrial fibrillation: Secondary | ICD-10-CM | POA: Diagnosis not present

## 2020-01-21 DIAGNOSIS — I10 Essential (primary) hypertension: Secondary | ICD-10-CM | POA: Diagnosis not present

## 2020-01-24 ENCOUNTER — Other Ambulatory Visit (HOSPITAL_COMMUNITY): Payer: Self-pay | Admitting: Cardiovascular Disease

## 2020-01-24 ENCOUNTER — Ambulatory Visit (HOSPITAL_COMMUNITY)
Admission: RE | Admit: 2020-01-24 | Payer: Medicare Other | Source: Ambulatory Visit | Attending: Cardiovascular Disease | Admitting: Cardiovascular Disease

## 2020-01-24 ENCOUNTER — Other Ambulatory Visit: Payer: Self-pay | Admitting: *Deleted

## 2020-01-24 DIAGNOSIS — I739 Peripheral vascular disease, unspecified: Secondary | ICD-10-CM

## 2020-01-24 NOTE — Patient Outreach (Signed)
Member screened for potential [facility] Care Management needs as a benefit of [employer].  Cheryl Atkinson discharge discharged from [facility] today 01/24/20.  Telephone to Cheryl Atkinson. Patient identifiers confirmed. Explained [facility] Care Management follow up. She is agreeable.  Will make appropriate [facility] referrals.   Raiford Noble, MSN-Ed, RN,BSN [facility] Post Acute Care Coordinator [phone] [facility]) [phone]  (Toll free office)

## 2020-01-25 ENCOUNTER — Ambulatory Visit (HOSPITAL_COMMUNITY): Admission: RE | Admit: 2020-01-25 | Payer: Medicare Other | Source: Ambulatory Visit

## 2020-01-25 ENCOUNTER — Encounter (HOSPITAL_COMMUNITY): Payer: Self-pay | Admitting: Radiology

## 2020-01-25 ENCOUNTER — Inpatient Hospital Stay (HOSPITAL_COMMUNITY): Payer: Medicare Other

## 2020-01-25 ENCOUNTER — Telehealth (HOSPITAL_COMMUNITY): Payer: Self-pay

## 2020-01-25 ENCOUNTER — Emergency Department (HOSPITAL_COMMUNITY): Payer: Medicare Other

## 2020-01-25 ENCOUNTER — Inpatient Hospital Stay (HOSPITAL_COMMUNITY)
Admission: EM | Admit: 2020-01-25 | Discharge: 2020-01-29 | DRG: 872 | Disposition: A | Discharge status: Home / Self Care | Payer: Medicare Other | Attending: Internal Medicine | Admitting: Internal Medicine

## 2020-01-25 DIAGNOSIS — Z4682 Encounter for fitting and adjustment of non-vascular catheter: Secondary | ICD-10-CM | POA: Diagnosis not present

## 2020-01-25 DIAGNOSIS — R7303 Prediabetes: Secondary | ICD-10-CM | POA: Diagnosis present

## 2020-01-25 DIAGNOSIS — K219 Gastro-esophageal reflux disease without esophagitis: Secondary | ICD-10-CM | POA: Diagnosis present

## 2020-01-25 DIAGNOSIS — Z7902 Long term (current) use of antithrombotics/antiplatelets: Secondary | ICD-10-CM

## 2020-01-25 DIAGNOSIS — J449 Chronic obstructive pulmonary disease, unspecified: Secondary | ICD-10-CM | POA: Diagnosis present

## 2020-01-25 DIAGNOSIS — K810 Acute cholecystitis: Secondary | ICD-10-CM | POA: Diagnosis present

## 2020-01-25 DIAGNOSIS — I480 Paroxysmal atrial fibrillation: Secondary | ICD-10-CM | POA: Diagnosis present

## 2020-01-25 DIAGNOSIS — A419 Sepsis, unspecified organism: Secondary | ICD-10-CM | POA: Diagnosis present

## 2020-01-25 DIAGNOSIS — B961 Klebsiella pneumoniae [K. pneumoniae] as the cause of diseases classified elsewhere: Secondary | ICD-10-CM | POA: Diagnosis present

## 2020-01-25 DIAGNOSIS — I10 Essential (primary) hypertension: Secondary | ICD-10-CM | POA: Diagnosis present

## 2020-01-25 DIAGNOSIS — G2581 Restless legs syndrome: Secondary | ICD-10-CM | POA: Diagnosis present

## 2020-01-25 DIAGNOSIS — I444 Left anterior fascicular block: Secondary | ICD-10-CM | POA: Diagnosis present

## 2020-01-25 DIAGNOSIS — R6520 Severe sepsis without septic shock: Secondary | ICD-10-CM | POA: Diagnosis not present

## 2020-01-25 DIAGNOSIS — A4181 Sepsis due to Enterococcus: Principal | ICD-10-CM | POA: Diagnosis present

## 2020-01-25 DIAGNOSIS — K8309 Other cholangitis: Secondary | ICD-10-CM | POA: Diagnosis present

## 2020-01-25 DIAGNOSIS — E876 Hypokalemia: Secondary | ICD-10-CM | POA: Diagnosis not present

## 2020-01-25 DIAGNOSIS — Z20822 Contact with and (suspected) exposure to covid-19: Secondary | ICD-10-CM | POA: Diagnosis present

## 2020-01-25 DIAGNOSIS — Z8249 Family history of ischemic heart disease and other diseases of the circulatory system: Secondary | ICD-10-CM | POA: Diagnosis not present

## 2020-01-25 DIAGNOSIS — I70219 Atherosclerosis of native arteries of extremities with intermittent claudication, unspecified extremity: Secondary | ICD-10-CM | POA: Diagnosis present

## 2020-01-25 DIAGNOSIS — K5900 Constipation, unspecified: Secondary | ICD-10-CM | POA: Diagnosis present

## 2020-01-25 DIAGNOSIS — R7989 Other specified abnormal findings of blood chemistry: Secondary | ICD-10-CM

## 2020-01-25 DIAGNOSIS — E039 Hypothyroidism, unspecified: Secondary | ICD-10-CM | POA: Diagnosis present

## 2020-01-25 DIAGNOSIS — R7881 Bacteremia: Secondary | ICD-10-CM | POA: Diagnosis present

## 2020-01-25 DIAGNOSIS — Z66 Do not resuscitate: Secondary | ICD-10-CM | POA: Diagnosis present

## 2020-01-25 DIAGNOSIS — Z79899 Other long term (current) drug therapy: Secondary | ICD-10-CM

## 2020-01-25 DIAGNOSIS — E785 Hyperlipidemia, unspecified: Secondary | ICD-10-CM | POA: Diagnosis present

## 2020-01-25 DIAGNOSIS — J80 Acute respiratory distress syndrome: Secondary | ICD-10-CM | POA: Diagnosis not present

## 2020-01-25 DIAGNOSIS — I4891 Unspecified atrial fibrillation: Secondary | ICD-10-CM | POA: Diagnosis present

## 2020-01-25 DIAGNOSIS — R109 Unspecified abdominal pain: Secondary | ICD-10-CM | POA: Diagnosis not present

## 2020-01-25 DIAGNOSIS — Z9842 Cataract extraction status, left eye: Secondary | ICD-10-CM

## 2020-01-25 DIAGNOSIS — R000 Tachycardia, unspecified: Secondary | ICD-10-CM | POA: Diagnosis not present

## 2020-01-25 DIAGNOSIS — K828 Other specified diseases of gallbladder: Secondary | ICD-10-CM

## 2020-01-25 DIAGNOSIS — Z823 Family history of stroke: Secondary | ICD-10-CM | POA: Diagnosis not present

## 2020-01-25 DIAGNOSIS — D649 Anemia, unspecified: Secondary | ICD-10-CM | POA: Diagnosis present

## 2020-01-25 DIAGNOSIS — R0689 Other abnormalities of breathing: Secondary | ICD-10-CM | POA: Diagnosis not present

## 2020-01-25 DIAGNOSIS — I959 Hypotension, unspecified: Secondary | ICD-10-CM | POA: Diagnosis not present

## 2020-01-25 DIAGNOSIS — Z7984 Long term (current) use of oral hypoglycemic drugs: Secondary | ICD-10-CM

## 2020-01-25 DIAGNOSIS — Z961 Presence of intraocular lens: Secondary | ICD-10-CM | POA: Diagnosis present

## 2020-01-25 DIAGNOSIS — Z9841 Cataract extraction status, right eye: Secondary | ICD-10-CM

## 2020-01-25 DIAGNOSIS — R079 Chest pain, unspecified: Secondary | ICD-10-CM | POA: Diagnosis not present

## 2020-01-25 LAB — BLOOD CULTURE ID PANEL (REFLEXED)

## 2020-01-25 LAB — CBC WITH DIFFERENTIAL/PLATELET
Abs Immature Granulocytes: 0.06 10*3/uL (ref 0.00–0.07)
Abs Immature Granulocytes: 0.05 10*3/uL (ref 0.00–0.07)
Basophils Absolute: 0 10*3/uL (ref 0.0–0.1)
Basophils Absolute: 0 10*3/uL (ref 0.0–0.1)
Basophils Relative: 0 %
Basophils Relative: 0 %
Eosinophils Absolute: 0 10*3/uL (ref 0.0–0.5)
Eosinophils Absolute: 0.1 10*3/uL (ref 0.0–0.5)
Eosinophils Relative: 0 %
Eosinophils Relative: 1 %
HCT: 28.9 % — ABNORMAL LOW (ref 36.0–46.0)
HCT: 31.5 % — ABNORMAL LOW (ref 36.0–46.0)
Hemoglobin: 8.9 g/dL — ABNORMAL LOW (ref 12.0–15.0)
Hemoglobin: 9.9 g/dL — ABNORMAL LOW (ref 12.0–15.0)
Immature Granulocytes: 1 %
Immature Granulocytes: 0 %
Lymphocytes Relative: 5 %
Lymphocytes Relative: 9 %
Lymphs Abs: 0.5 10*3/uL — ABNORMAL LOW (ref 0.7–4.0)
Lymphs Abs: 1.1 10*3/uL (ref 0.7–4.0)
MCH: 29.2 pg (ref 26.0–34.0)
MCH: 29.7 pg (ref 26.0–34.0)
MCHC: 30.8 g/dL (ref 30.0–36.0)
MCHC: 31.4 g/dL (ref 30.0–36.0)
MCV: 94.8 fL (ref 80.0–100.0)
MCV: 94.6 fL (ref 80.0–100.0)
Monocytes Absolute: 0.8 10*3/uL (ref 0.1–1.0)
Monocytes Absolute: 0.9 10*3/uL (ref 0.1–1.0)
Monocytes Relative: 8 %
Monocytes Relative: 7 %
Neutro Abs: 8.8 10*3/uL — ABNORMAL HIGH (ref 1.7–7.7)
Neutro Abs: 9.9 10*3/uL — ABNORMAL HIGH (ref 1.7–7.7)
Neutrophils Relative %: 86 %
Neutrophils Relative %: 83 %
Platelets: 173 10*3/uL (ref 150–400)
Platelets: 174 10*3/uL (ref 150–400)
RBC: 3.05 MIL/uL — ABNORMAL LOW (ref 3.87–5.11)
RBC: 3.33 MIL/uL — ABNORMAL LOW (ref 3.87–5.11)
RDW: 14.3 % (ref 11.5–15.5)
RDW: 14.6 % (ref 11.5–15.5)
WBC: 10.2 10*3/uL (ref 4.0–10.5)
WBC: 12 10*3/uL — ABNORMAL HIGH (ref 4.0–10.5)
nRBC: 0 % (ref 0.0–0.2)
nRBC: 0 % (ref 0.0–0.2)

## 2020-01-25 LAB — LACTIC ACID, PLASMA
Lactic Acid, Venous: 2 mmol/L (ref 0.5–1.9)
Lactic Acid, Venous: 1.3 mmol/L (ref 0.5–1.9)
Lactic Acid, Venous: 1.2 mmol/L (ref 0.5–1.9)

## 2020-01-25 LAB — COMPREHENSIVE METABOLIC PANEL
ALT: 344 U/L — ABNORMAL HIGH (ref 0–44)
AST: 494 U/L — ABNORMAL HIGH (ref 15–41)
Albumin: 3 g/dL — ABNORMAL LOW (ref 3.5–5.0)
Alkaline Phosphatase: 187 U/L — ABNORMAL HIGH (ref 38–126)
Anion gap: 13 (ref 5–15)
BUN: 24 mg/dL — ABNORMAL HIGH (ref 8–23)
CO2: 20 mmol/L — ABNORMAL LOW (ref 22–32)
Calcium: 8.1 mg/dL — ABNORMAL LOW (ref 8.9–10.3)
Chloride: 107 mmol/L (ref 98–111)
Creatinine, Ser: 0.9 mg/dL (ref 0.44–1.00)
GFR calc Af Amer: >60 mL/min (ref >60)
GFR calc non Af Amer: 58 mL/min — ABNORMAL LOW (ref >60)
Glucose, Bld: 175 mg/dL — ABNORMAL HIGH (ref 70–99)
Potassium: 2.9 mmol/L — ABNORMAL LOW (ref 3.5–5.1)
Sodium: 140 mmol/L (ref 135–145)
Total Bilirubin: 1.6 mg/dL — ABNORMAL HIGH (ref 0.3–1.2)
Total Protein: 5.7 g/dL — ABNORMAL LOW (ref 6.5–8.1)

## 2020-01-25 LAB — BASIC METABOLIC PANEL
Anion gap: 11 (ref 5–15)
BUN: 24 mg/dL — ABNORMAL HIGH (ref 8–23)
CO2: 22 mmol/L (ref 22–32)
Calcium: 8.1 mg/dL — ABNORMAL LOW (ref 8.9–10.3)
Chloride: 107 mmol/L (ref 98–111)
Creatinine, Ser: 0.87 mg/dL (ref 0.44–1.00)
GFR calc Af Amer: >60 mL/min (ref >60)
GFR calc non Af Amer: >60 mL/min (ref >60)
Glucose, Bld: 109 mg/dL — ABNORMAL HIGH (ref 70–99)
Potassium: 3.5 mmol/L (ref 3.5–5.1)
Sodium: 140 mmol/L (ref 135–145)

## 2020-01-25 LAB — URINALYSIS, ROUTINE W REFLEX MICROSCOPIC
Bilirubin Urine: NEGATIVE
Glucose, UA: NEGATIVE mg/dL
Ketones, ur: NEGATIVE mg/dL
Leukocytes,Ua: NEGATIVE
Nitrite: NEGATIVE
Protein, ur: NEGATIVE mg/dL
Specific Gravity, Urine: >1.046 — ABNORMAL HIGH (ref 1.005–1.030)
pH: 5 (ref 5.0–8.0)

## 2020-01-25 LAB — HEPATIC FUNCTION PANEL
ALT: 330 U/L — ABNORMAL HIGH (ref 0–44)
AST: 334 U/L — ABNORMAL HIGH (ref 15–41)
Albumin: 3.1 g/dL — ABNORMAL LOW (ref 3.5–5.0)
Alkaline Phosphatase: 187 U/L — ABNORMAL HIGH (ref 38–126)
Bilirubin, Direct: 0.8 mg/dL — ABNORMAL HIGH (ref 0.0–0.2)
Indirect Bilirubin: 0.8 mg/dL (ref 0.3–0.9)
Total Bilirubin: 1.6 mg/dL — ABNORMAL HIGH (ref 0.3–1.2)
Total Protein: 6.2 g/dL — ABNORMAL LOW (ref 6.5–8.1)

## 2020-01-25 LAB — PROTIME-INR
INR: 1.4 — ABNORMAL HIGH (ref 0.8–1.2)
Prothrombin Time: 17.1 seconds — ABNORMAL HIGH (ref 11.4–15.2)

## 2020-01-25 LAB — CBG MONITORING, ED: Glucose-Capillary: 120 mg/dL — ABNORMAL HIGH (ref 70–99)

## 2020-01-25 LAB — APTT: aPTT: 28 seconds (ref 24–36)

## 2020-01-25 LAB — PROCALCITONIN: Procalcitonin: 1.33 ng/mL

## 2020-01-25 LAB — GLUCOSE, CAPILLARY
Glucose-Capillary: 71 mg/dL (ref 70–99)
Glucose-Capillary: 97 mg/dL (ref 70–99)

## 2020-01-25 LAB — POC SARS CORONAVIRUS 2 AG -  ED: SARS Coronavirus 2 Ag: NEGATIVE

## 2020-01-25 LAB — SARS CORONAVIRUS 2 (TAT 6-24 HRS): SARS Coronavirus 2: NEGATIVE

## 2020-01-25 LAB — TROPONIN I (HIGH SENSITIVITY): Troponin I (High Sensitivity): 20 ng/L — ABNORMAL HIGH (ref <18)

## 2020-01-25 MED ORDER — MORPHINE SULFATE (PF) 2 MG/ML IV SOLN
1.0000 mg | INTRAVENOUS | Status: DC | PRN
  Administered 2020-01-27: 2 mg via INTRAVENOUS

## 2020-01-25 MED ORDER — VANCOMYCIN HCL 1250 MG/250ML IV SOLN
1250.0000 mg | Freq: Once | INTRAVENOUS | Status: AC
  Administered 2020-01-25: 1250 mg via INTRAVENOUS
  Filled 2020-01-25: qty 250

## 2020-01-25 MED ORDER — INSULIN ASPART 100 UNIT/ML ~~LOC~~ SOLN: 0.0000 [IU] | SUBCUTANEOUS | Status: DC

## 2020-01-25 MED ORDER — MORPHINE SULFATE (PF) 4 MG/ML IV SOLN
2.5000 mg | Freq: Once | INTRAVENOUS | Status: DC
  Filled 2020-01-25: qty 1

## 2020-01-25 MED ORDER — PIPERACILLIN-TAZOBACTAM 3.375 G IVPB
3.3750 g | Freq: Three times a day (TID) | INTRAVENOUS | Status: DC
  Administered 2020-01-25: 3.375 g via INTRAVENOUS
  Filled 2020-01-25 (×3): qty 50

## 2020-01-25 MED ORDER — TECHNETIUM TC 99M MEBROFENIN IV KIT
5.0000 | PACK | Freq: Once | INTRAVENOUS | Status: AC | PRN
  Administered 2020-01-25: 5 via INTRAVENOUS

## 2020-01-25 MED ORDER — IOHEXOL 300 MG/ML  SOLN
100.0000 mL | Freq: Once | INTRAMUSCULAR | Status: AC | PRN
  Administered 2020-01-25: 100 mL via INTRAVENOUS

## 2020-01-25 MED ORDER — ONDANSETRON HCL 4 MG/2ML IJ SOLN: 4.0000 mg | Freq: Four times a day (QID) | INTRAMUSCULAR | Status: DC | PRN

## 2020-01-25 MED ORDER — ONDANSETRON HCL 4 MG PO TABS: 4.0000 mg | ORAL_TABLET | Freq: Four times a day (QID) | ORAL | Status: DC | PRN

## 2020-01-25 MED ORDER — ACETAMINOPHEN 325 MG PO TABS
650.0000 mg | ORAL_TABLET | Freq: Four times a day (QID) | ORAL | Status: DC | PRN
  Administered 2020-01-29: 650 mg via ORAL
  Filled 2020-01-25: qty 2

## 2020-01-25 MED ORDER — HYDRALAZINE HCL 20 MG/ML IJ SOLN: 10.0000 mg | INTRAMUSCULAR | Status: DC | PRN

## 2020-01-25 MED ORDER — SODIUM CHLORIDE 0.9 % IV SOLN
2.0000 g | INTRAVENOUS | Status: DC
  Administered 2020-01-25 – 2020-01-27 (×3): 2 g via INTRAVENOUS
  Filled 2020-01-25 (×3): qty 20

## 2020-01-25 MED ORDER — SODIUM CHLORIDE 0.9 % IV SOLN: INTRAVENOUS | Status: DC

## 2020-01-25 MED ORDER — ROTIGOTINE 3 MG/24HR TD PT24
3.0000 mg | MEDICATED_PATCH | Freq: Every day | TRANSDERMAL | Status: DC
  Administered 2020-01-28: 3 mg via TRANSDERMAL

## 2020-01-25 MED ORDER — LACTATED RINGERS IV SOLN: INTRAVENOUS | Status: AC

## 2020-01-25 MED ORDER — VANCOMYCIN HCL IN DEXTROSE 1-5 GM/200ML-% IV SOLN: 1000.0000 mg | Freq: Once | INTRAVENOUS | Status: DC

## 2020-01-25 MED ORDER — SODIUM CHLORIDE 0.9 % IV SOLN
2.0000 g | Freq: Once | INTRAVENOUS | Status: AC
  Administered 2020-01-25: 2 g via INTRAVENOUS
  Filled 2020-01-25: qty 2

## 2020-01-25 MED ORDER — ALBUTEROL SULFATE (2.5 MG/3ML) 0.083% IN NEBU: 2.5000 mg | INHALATION_SOLUTION | Freq: Four times a day (QID) | RESPIRATORY_TRACT | Status: DC | PRN

## 2020-01-25 MED ORDER — CEFAZOLIN SODIUM-DEXTROSE 2-4 GM/100ML-% IV SOLN
2.0000 g | INTRAVENOUS | Status: AC
  Administered 2020-01-25: 2 g via INTRAVENOUS
  Filled 2020-01-25: qty 100

## 2020-01-25 MED ORDER — ACETAMINOPHEN 650 MG RE SUPP: 650.0000 mg | Freq: Four times a day (QID) | RECTAL | Status: DC | PRN

## 2020-01-25 MED ORDER — METRONIDAZOLE IN NACL 5-0.79 MG/ML-% IV SOLN
500.0000 mg | Freq: Once | INTRAVENOUS | Status: AC
  Administered 2020-01-25: 500 mg via INTRAVENOUS
  Filled 2020-01-25: qty 100

## 2020-01-25 MED ORDER — POTASSIUM CHLORIDE 10 MEQ/100ML IV SOLN
10.0000 meq | INTRAVENOUS | Status: AC
  Administered 2020-01-25 (×3): 10 meq via INTRAVENOUS
  Filled 2020-01-25 (×3): qty 100

## 2020-01-25 NOTE — ED Notes (Signed)
Pt to and from CT via cart. Pt conscious, breathing, and A&Ox4. No distress noted.  

## 2020-01-25 NOTE — ED Notes (Signed)
Admitting service at bedside

## 2020-01-25 NOTE — ED Notes (Signed)
Pt resting in bed. Pt denies new or worsening complaints. Will continue to monitor. No distress noted. Pt on continuous monitoring via blood pressure, pulse ox, and cardiac monitor.  

## 2020-01-25 NOTE — Progress Notes (Signed)
Radiology called me about 4pm today and said they will be replacing tubing tomorrow that had fallen out in SNFand they would call son to let him know.

## 2020-01-25 NOTE — H&P (Signed)
History and Physical    SONDRA BLIXT Atkinson:[id number] DOB: 09/30/1931 DOA: 01/25/2020  PCP: Shelva Majestic, MD   Patient coming from: Home.  Chief Complaint: Shortness of breath fever weakness.  History obtained from patient's son.  HPI: Cheryl Atkinson is a 86 y.o. female with history of paroxysmal atrial fibrillation recently diagnosed, hypertension, prediabetes who was recently admitted for acute cholecystitis and discharged to rehab on January 03, 2020 about 22 days ago at that time was managed conservatively with cholecystostomy drain.  Was discharged from the rehab yesterday to home and patient was found to be weak.  Is not known when the cholecystostomy drain was removed or dislodged it is supposed to be on for 8 weeks.  Last night when patient's son tried to ambulate the patient to the bathroom patient was weak was complaining of shortness of breath and also was febrile with temperature 100.5 F.  Patient was brought to the ER.  Not have any chest pain nausea vomiting or diarrhea.  ED Course: In the ER blood pressure initially was 97 systolic temperature 100.2 lactic acid elevated 2 hemoglobin 8.9 potassium 2.9 LFTs was elevated AST of 494 ALT of 344 blood glucose 175.  CT abdomen pelvis shows improvement of the gallbladder but acute cholecystitis cannot be ruled out.  On exam patient has significant tenderness of the epigastrium right upper quadrant.  Patient was empirically started on antibiotics for possible loading sepsis from cholangitis versus cholecystitis.  Covid test was pending.  Review of Systems: As per HPI, rest all negative.   Past Medical History:  Diagnosis Date  . Anemia   . Arthritis    "shoulders" (05/04/2015)  . Cellulitis of right lower extremity 11/27/2013  . Chronic lower back pain   . Constipation   . GERD (gastroesophageal reflux disease)   . History of hiatal hernia   . Hypertension   . Osteoporosis   . Peripheral arterial disease (HCC)   .  Restless leg syndrome   . Thyroid disease     Past Surgical History:  Procedure Laterality Date  . ABDOMINAL AORTAGRAM  05/04/2015   Procedure: Abdominal Aortagram;  Surgeon: Runell Gess, MD;  Location: [facility] INVASIVE CV LAB;  Service: Cardiovascular;;  . APPENDECTOMY    . CATARACT EXTRACTION W/ INTRAOCULAR LENS  IMPLANT, BILATERAL Bilateral   . I & D EXTREMITY Right 12/22/2016   Procedure: IRRIGATION AND DEBRIDEMENT EXTREMITY;  Surgeon: Mack Hook, MD;  Location: [facility] OR;  Service: Orthopedics;  Laterality: Right;  . IR PATIENT EVAL TECH 0-60 MINS  12/30/2019  . PERIPHERAL VASCULAR CATHETERIZATION N/A 05/04/2015   Procedure: Lower Extremity Angiography;  Surgeon: Runell Gess, MD;  Location: [facility] INVASIVE CV LAB;  Service: Cardiovascular;  Laterality: N/A;  . PERIPHERAL VASCULAR CATHETERIZATION  05/04/2015   Procedure: Peripheral Vascular Intervention;  Surgeon: Runell Gess, MD;  Location: [facility] INVASIVE CV LAB;  Service: Cardiovascular;;  RCIA - 7x22 ICAST  . PERIPHERAL VASCULAR CATHETERIZATION Right 09/04/2015   Procedure: Peripheral Vascular Atherectomy;  Surgeon: Runell Gess, MD;  Location: [facility] INVASIVE CV LAB;  Service: Cardiovascular;  Laterality: Right;  SFA  . sfa Right 09/04/2015   de balloon  . THYROID SURGERY Right ?2013   "had goiter taken off my neck"  . TONSILLECTOMY    . VAGINAL HYSTERECTOMY       reports that she quit smoking about 4 years ago. Her smoking use included cigarettes. She has a 30.00 pack-year smoking history. She has never used  smokeless tobacco. She reports that she does not drink alcohol or use drugs.  Allergies  Allergen Reactions  . Codeine Other (See Comments)    REACTION: Syncope     Family History  Problem Relation Age of Onset  . Heart disease Father   . Heart attack Father   . Heart disease Mother   . Coronary artery disease Other   . Atrial fibrillation Sister   . Stroke Maternal Grandmother   . Cancer Paternal Grandmother      Prior to Admission medications   Medication Sig Start Date End Date Taking? Authorizing Provider  acetaminophen (TYLENOL) 325 MG tablet Take 2 tablets (650 mg total) by mouth every 6 (six) hours as needed for mild pain, moderate pain or fever (or Fever >/= 101). 01/03/20   Hongalgi, Maximino Greenland, MD  Albuterol Sulfate (PROAIR RESPICLICK) 108 (90 Base) MCG/ACT AEPB Inhale 2 puffs into the lungs every 6 (six) hours as needed (shortness of breath from COPD). 12/29/18   Shelva Majestic, MD  apixaban (ELIQUIS) 5 MG TABS tablet Take 1 tablet (5 mg total) by mouth 2 (two) times daily. Start 01/04/2020. 01/04/20   Elease Etienne, MD  atorvastatin (LIPITOR) 40 MG tablet Take 1 tablet (40 mg total) by mouth daily. 10/07/19   Shelva Majestic, MD  cephALEXin (KEFLEX) 500 MG capsule Take 1 capsule (500 mg total) by mouth in the morning, at noon, in the evening, and at bedtime. Discontinue after 01/08/2020 doses. 01/03/20   Hongalgi, Maximino Greenland, MD  clopidogrel (PLAVIX) 75 MG tablet Take 1 tablet (75 mg total) by mouth daily with breakfast. MUST KEEP APPOINTMENT 07/20/19 WITH DR Allyson Sabal FOR FUTURE REFILLS 06/11/19   Runell Gess, MD  metFORMIN (GLUCOPHAGE) 500 MG tablet TAKE 1 (1) TABLET BY MOUTH DAILY Patient taking differently: Take 500 mg by mouth daily with breakfast.  10/05/19   Shelva Majestic, MD  Multiple Vitamins-Minerals (PRESERVISION AREDS 2 PO) Take 1 tablet by mouth 2 (two) times daily.    [provider]  Olmesartan-amLODIPine-HCTZ (TRIBENZOR) 40-10-12.5 MG TABS Take 1 tablet by mouth every morning. Patient taking differently: Take 1 tablet by mouth daily.  10/05/19   Shelva Majestic, MD  polyethylene glycol (MIRALAX / GLYCOLAX) 17 g packet Take 17 g by mouth 2 (two) times daily. Reported on 03/14/2016 01/03/20   Elease Etienne, MD  Rotigotine 3 MG/24HR PT24 Place 3 mg onto the skin at bedtime. 03/31/19   Shelva Majestic, MD  sodium chloride flush (NS) 0.9 % SOLN 5 mLs by Intracatheter route  every 8 (eight) hours. 01/03/20   Rayburn, Alphonsus Sias, PA-C    Physical Exam: Constitutional: Moderately built and nourished. Vitals:   01/25/20 0107 01/25/20 0130 01/25/20 0200 01/25/20 0330  BP:  116/61 (!) 122/55 (!) 97/53  Pulse:  89 80 (!) 57  Resp:  (!) 26 (!) 28 (!) 21  Temp: 100.2 F (37.9 C)     TempSrc: Rectal     SpO2:  96% 96% 100%   Eyes: Anicteric no pallor. ENMT: No discharge from the ears eyes nose or mouth. Neck: No mass or.  No neck rigidity. Respiratory: No rhonchi or crepitations. Cardiovascular: S1-S2 heard. Abdomen: Soft epigastric tenderness no guarding or rigidity. Musculoskeletal: No edema. Skin: No rash. Neurologic: Patient is sleeping but arousable and follows commands oriented to name.  Moves all extremities. Psychiatric: Sleeping but oriented to name.   Labs on Admission: I have personally reviewed following labs and imaging studies  CBC: Recent Labs  Lab 01/25/20 0100  WBC 10.2  NEUTROABS 8.8*  HGB 8.9*  HCT 28.9*  MCV 94.8  PLT 063   Basic Metabolic Panel: Recent Labs  Lab 01/25/20 0100  NA 140  K 2.9*  CL 107  CO2 20*  GLUCOSE 175*  BUN 24*  CREATININE 0.90  CALCIUM 8.1*   GFR: CrCl cannot be calculated (Unknown ideal weight.). Liver Function Tests: Recent Labs  Lab 01/25/20 0100  AST 494*  ALT 344*  ALKPHOS 187*  BILITOT 1.6*  PROT 5.7*  ALBUMIN 3.0*   No results for input(s): LIPASE, AMYLASE in the last 168 hours. No results for input(s): AMMONIA in the last 168 hours. Coagulation Profile: Recent Labs  Lab 01/25/20 0100  INR 1.4*   Cardiac Enzymes: No results for input(s): CKTOTAL, CKMB, CKMBINDEX, TROPONINI in the last 168 hours. BNP (last 3 results) No results for input(s): PROBNP in the last 8760 hours. HbA1C: No results for input(s): HGBA1C in the last 72 hours. CBG: No results for input(s): GLUCAP in the last 168 hours. Lipid Profile: No results for input(s): CHOL, HDL, LDLCALC, TRIG, CHOLHDL,  LDLDIRECT in the last 72 hours. Thyroid Function Tests: No results for input(s): TSH, T4TOTAL, FREET4, T3FREE, THYROIDAB in the last 72 hours. Anemia Panel: No results for input(s): VITAMINB12, FOLATE, FERRITIN, TIBC, IRON, RETICCTPCT in the last 72 hours. Urine analysis:    Component Value Date/Time   COLORURINE YELLOW 12/28/2019 2317   APPEARANCEUR CLOUDY (A) 12/28/2019 2317   LABSPEC 1.014 12/28/2019 2317   PHURINE 6.0 12/28/2019 2317   GLUCOSEU NEGATIVE 12/28/2019 2317   HGBUR NEGATIVE 12/28/2019 2317   BILIRUBINUR NEGATIVE 12/28/2019 2317   BILIRUBINUR Negative 07/14/2019 1417   KETONESUR NEGATIVE 12/28/2019 2317   PROTEINUR 30 (A) 12/28/2019 2317   UROBILINOGEN 0.2 08/20/2019 1732   NITRITE NEGATIVE 12/28/2019 2317   LEUKOCYTESUR LARGE (A) 12/28/2019 2317   Sepsis Labs: @LABRCNTIP (procalcitonin:4,lacticidven:4) )No results found for this or any previous visit (from the past 240 hour(s)).   Radiological Exams on Admission: CT ABDOMEN PELVIS W CONTRAST  Result Date: 01/25/2020 CLINICAL DATA:  Fever and abdominal pain, recent percutaneous cholecystostomy EXAM: CT ABDOMEN AND PELVIS WITH CONTRAST TECHNIQUE: Multidetector CT imaging of the abdomen and pelvis was performed using the standard protocol following bolus administration of intravenous contrast. CONTRAST:  168mL OMNIPAQUE IOHEXOL 300 MG/ML  SOLN COMPARISON:  12/29/2019 FINDINGS: Lower chest: Hypoventilatory changes are seen at the lung bases. Hepatobiliary: There is mild residual gallbladder wall thickening, though markedly improved since prior CT. Gallbladder is only minimally distended. The liver is unremarkable. Pancreas: Unremarkable. No pancreatic ductal dilatation or surrounding inflammatory changes. Spleen: Normal in size without focal abnormality. Adrenals/Urinary Tract: The adrenals, kidneys, and bladder are stable. No acute findings. Stomach/Bowel: No bowel obstruction or ileus. Moderate retained stool within the  distal colon. Vascular/Lymphatic: Stable mild dilatation of the infrarenal abdominal aorta measuring 2.5 cm. Extensive aortic atherosclerosis. No pathologic adenopathy. Reproductive: Status post hysterectomy. No adnexal masses. Other: No abdominal wall hernia or abnormality. No abdominopelvic ascites. Musculoskeletal: No acute or destructive bony lesions. Reconstructed images demonstrate no additional findings. IMPRESSION: 1. Residual gallbladder wall thickening, though markedly improved since prior study. Persistent underlying cholecystitis cannot be excluded. 2. Moderate fecal retention. 3. Otherwise stable exam. Electronically Signed   By: Randa Ngo M.D.   On: 01/25/2020 02:55   DG Chest Port 1 View  Result Date: 01/25/2020 CLINICAL DATA:  Chest pain for 1 day EXAM: PORTABLE CHEST 1 VIEW COMPARISON:  04/29/2016 FINDINGS: Single frontal view of the chest is obtained, with the patient rotated toward the right. Evaluation is limited by positioning and portable AP technique. Cardiac silhouette is grossly stable. Continued ectasia of the thoracic aorta. No airspace disease, effusion, or pneumothorax. Chronic right humeral infarct. IMPRESSION: 1. Stable exam allowing for differences in positioning and technique. No acute process. Electronically Signed   By: Sharlet Salina M.D.   On: 01/25/2020 01:22    EKG: Independently reviewed.  Normal sinus rhythm with nonspecific changes.  Assessment/Plan Principal Problem:   Sepsis (HCC) Active Problems:   Essential hypertension   COPD (chronic obstructive pulmonary disease) (HCC)   DNR (do not resuscitate)   Acute cholangitis   Atrial fibrillation (HCC)    1. Possible developing sepsis from cholangitis versus cholecystitis for which I ordered MRCP on empiric antibiotics for now continue hydration follow lactate level procalcitonin levels.  Have consulted general surgery. 2. Hypertension presently blood pressure is in the low normal for which I have  placed patient on as needed IV hydralazine. 3. History of prediabetes presently have kept patient on CBG every 4.  Closely monitor.  If there is significant elevation in CBG may need sliding scale coverage. 4. History of atrial fibrillation presently rate controlled in sinus rhythm.  Patient was discharged on apixaban not sure if his patient was taking and patient's was not able to be verified with patient's son we need to verify patient's home medicine. 5. Anemia follow CBC.  Given the septic picture admission will need close monitoring for further deterioration in inpatient status.  Covid test is pending.   DVT prophylaxis: SCDs.  Holding anticoagulation in anticipation of possible procedure. Code Status: DNR confirmed with patient's son. Family Communication: Patient's son. Disposition Plan: To be determined. Consults called: General surgery. Admission status: Inpatient.   Eduard Clos MD [facility] Pager [phone].  If 7PM-7AM, please contact night-coverage [email or web address] Password [email or web address]  01/25/2020, 4:20 AM

## 2020-01-25 NOTE — ED Notes (Signed)
IV medications given and charted per [facility].

## 2020-01-25 NOTE — Progress Notes (Signed)
[id number] Patient arrived to room. Set up on tele monitor. Puriwick in place. Mepilex placed on saccrum for prevention. Room air. No complaints at this time. Call bell in reach. Bed alarmed.

## 2020-01-25 NOTE — Progress Notes (Signed)
  PROGRESS NOTE  Patient admitted earlier this morning. See H&P.   Her percutaneous cholecystostomy tube had accidentally fallen out while at rehab.  She started having shortness of breath, abdominal pain, and fevers prompting her to seek care.  The general surgery team was consulted and is going to ask the interventional radiology team to replace the tube.  Patient is frustrated and just wants to get better at this point.  Pain is controlled.  Currently on Zosyn.  Will maintain on SCDs while holding Plavix and Eliquis. NPO.   Cheryl Roche [place], DO [facility] 01/25/2020, 10:13 AM  Available via Epic secure chat 7am-7pm After these hours, please refer to coverage provider listed on [email or web address]

## 2020-01-25 NOTE — Progress Notes (Signed)
Pharmacy Antibiotic Note  Cheryl Atkinson is a 86 y.o. female admitted on 01/25/2020 with concern for recurrent cholecystitis s/p cholecystostomy drain that fell out early (placed 1/28, plan to maintain x8wk).  Pharmacy has been consulted for Zosyn dosing.  Of note on prior admission for cholecystitis biliary cx had returned as Klebsiella pneumoniae, sensitive to Zosyn.  Plan: Zosyn 3.375g IV q8h (4 hour infusion).  Height: 5\' 6"  (167.6 cm) Weight: 145 lb 4.5 oz (65.9 kg) IBW/kg (Calculated) : 59.3  Temp (24hrs), Avg:100.2 F (37.9 C), Min:100.2 F (37.9 C), Max:100.2 F (37.9 C)  Recent Labs  Lab 01/25/20 0100 01/25/20 0340  WBC 10.2  --   CREATININE 0.90  --   LATICACIDVEN 2.0* 1.3    Estimated Creatinine Clearance: 42 mL/min (by C-G formula based on SCr of 0.9 mg/dL).    Allergies  Allergen Reactions  . Codeine Other (See Comments)    REACTION: Syncope      Thank you for allowing pharmacy to be a part of this patient's care.  01/27/20, PharmD, BCPS  01/25/2020 4:27 AM

## 2020-01-25 NOTE — Progress Notes (Signed)
Order written by Dr. Ty Hilts for 2.5mg  of Morphine for Nuclear Medicine scan. Patient assessed prior to administration of medication, pt is oriented but drowsy. Vital signs stable: BP 132/62, HR 68, 97% on room air, RR 12. Dr. Ty Hilts called and made aware of patient mental status and concern that Morphine dosage may be too much for patient given current LOC. Verbal order given to hold medication. Dr. Ty Hilts also spoke with Carollee Herter, nuc med tech about further testing.

## 2020-01-25 NOTE — ED Provider Notes (Signed)
[facility] EMERGENCY DEPARTMENT Provider Note   CSN: [id number] Arrival date & time: 01/25/20  0038     History Chief Complaint  Patient presents with  . Shortness of Breath    Pt tachypnic and short of breath PTA. EMS gave 1G of acetaminophen and of normal saline. Pt currently denies all complaints.     Cheryl Atkinson is a 86 y.o. female.  Patient presents to the emergency department with a chief complaint of abdominal pain.  She was recently admitted to the hospital on 1/28 for acute cholecystitis.  This was confirmed on MRCP.  Due to the patient being a poor surgical candidate, she had a drain placed by interventional radiology.  She states that the drain was supposed to stay in for about 8 weeks, but it fell out early.  Tonight, her son noticed that she was breathing heavily.  She was noted to be febrile in triage with a rectal temperature of 100.2 degrees after having had 1 g of Tylenol in the field.  Patient complains of moderate abdominal pain.  She denies nausea or vomiting.  She denies any shortness of breath, but is noted to be tachypneic.  Reports slight cough.  She denies any hematuria or dysuria.    The history is provided by the patient. No language interpreter was used.       Past Medical History:  Diagnosis Date  . Anemia   . Arthritis    "shoulders" (05/04/2015)  . Cellulitis of right lower extremity 11/27/2013  . Chronic lower back pain   . Constipation   . GERD (gastroesophageal reflux disease)   . History of hiatal hernia   . Hypertension   . Osteoporosis   . Peripheral arterial disease (HCC)   . Restless leg syndrome   . Thyroid disease     Patient Active Problem List   Diagnosis Date Noted  . Acute cholecystitis 12/29/2019  . UTI (urinary tract infection) 12/29/2019  . Adrenal nodule (HCC) 12/29/2019  . Abdominal aortic ectasia (HCC) 12/29/2019  . Major depression 03/25/2018  . Cat bite of right hand 12/21/2016  . BPPV  (benign paroxysmal positional vertigo) 07/12/2016  . Memory loss 04/11/2016  . Mallet toe of right foot 10/20/2015  . Renal artery stenosis (HCC) 09/27/2015  . Hyperlipidemia 06/30/2015  . Claudication (HCC) 05/04/2015  . Atherosclerotic PVD with intermittent claudication (HCC) 04/26/2015  . Tinnitus 12/31/2014  . Former smoker 09/29/2014  . DNR (do not resuscitate) 09/29/2014  . Insulin resistance 07/19/2014  . Multinodular goiter 08/30/2013  . Spinal stenosis of lumbar region at multiple levels 09/29/2012  . HIP PAIN, BILATERAL 07/16/2010  . COPD (chronic obstructive pulmonary disease) (HCC) 09/20/2009  . CONSTIPATION, CHRONIC 09/20/2009  . Iron deficiency anemia 11/09/2007  . History of UTI 11/09/2007  . RESTLESS LEG SYNDROME 09/25/2007  . Essential hypertension 09/25/2007  . GERD 09/25/2007  . LOW BACK PAIN 09/25/2007  . Osteoporosis 09/25/2007    Past Surgical History:  Procedure Laterality Date  . ABDOMINAL AORTAGRAM  05/04/2015   Procedure: Abdominal Aortagram;  Surgeon: Runell Gess, MD;  Location: [facility] INVASIVE CV LAB;  Service: Cardiovascular;;  . APPENDECTOMY    . CATARACT EXTRACTION W/ INTRAOCULAR LENS  IMPLANT, BILATERAL Bilateral   . I & D EXTREMITY Right 12/22/2016   Procedure: IRRIGATION AND DEBRIDEMENT EXTREMITY;  Surgeon: Mack Hook, MD;  Location: [facility] OR;  Service: Orthopedics;  Laterality: Right;  . IR PATIENT EVAL TECH 0-60 MINS  12/30/2019  .  PERIPHERAL VASCULAR CATHETERIZATION N/A 05/04/2015   Procedure: Lower Extremity Angiography;  Surgeon: Runell Gess, MD;  Location: [facility] INVASIVE CV LAB;  Service: Cardiovascular;  Laterality: N/A;  . PERIPHERAL VASCULAR CATHETERIZATION  05/04/2015   Procedure: Peripheral Vascular Intervention;  Surgeon: Runell Gess, MD;  Location: [facility] INVASIVE CV LAB;  Service: Cardiovascular;;  RCIA - 7x22 ICAST  . PERIPHERAL VASCULAR CATHETERIZATION Right 09/04/2015   Procedure: Peripheral Vascular Atherectomy;  Surgeon: Runell Gess, MD;  Location: [facility] INVASIVE CV LAB;  Service: Cardiovascular;  Laterality: Right;  SFA  . sfa Right 09/04/2015   de balloon  . THYROID SURGERY Right ?2013   "had goiter taken off my neck"  . TONSILLECTOMY    . VAGINAL HYSTERECTOMY       OB History   No obstetric history on file.     Family History  Problem Relation Age of Onset  . Heart disease Father   . Heart attack Father   . Heart disease Mother   . Coronary artery disease Other   . Atrial fibrillation Sister   . Stroke Maternal Grandmother   . Cancer Paternal Grandmother     Social History   Tobacco Use  . Smoking status: Former Smoker    Packs/day: 0.50    Years: 60.00    Pack years: 30.00    Types: Cigarettes    Quit date: 04/02/2015    Years since quitting: 4.8  . Smokeless tobacco: Never Used  Substance Use Topics  . Alcohol use: No  . Drug use: No    Home Medications Prior to Admission medications   Medication Sig Start Date End Date Taking? Authorizing Provider  acetaminophen (TYLENOL) 325 MG tablet Take 2 tablets (650 mg total) by mouth every 6 (six) hours as needed for mild pain, moderate pain or fever (or Fever >/= 101). 01/03/20   Hongalgi, Maximino Greenland, MD  Albuterol Sulfate (PROAIR RESPICLICK) 108 (90 Base) MCG/ACT AEPB Inhale 2 puffs into the lungs every 6 (six) hours as needed (shortness of breath from COPD). 12/29/18   Shelva Majestic, MD  apixaban (ELIQUIS) 5 MG TABS tablet Take 1 tablet (5 mg total) by mouth 2 (two) times daily. Start 01/04/2020. 01/04/20   Elease Etienne, MD  atorvastatin (LIPITOR) 40 MG tablet Take 1 tablet (40 mg total) by mouth daily. 10/07/19   Shelva Majestic, MD  cephALEXin (KEFLEX) 500 MG capsule Take 1 capsule (500 mg total) by mouth in the morning, at noon, in the evening, and at bedtime. Discontinue after 01/08/2020 doses. 01/03/20   Hongalgi, Maximino Greenland, MD  clopidogrel (PLAVIX) 75 MG tablet Take 1 tablet (75 mg total) by mouth daily with breakfast. MUST KEEP APPOINTMENT  07/20/19 WITH DR Allyson Sabal FOR FUTURE REFILLS 06/11/19   Runell Gess, MD  metFORMIN (GLUCOPHAGE) 500 MG tablet TAKE 1 (1) TABLET BY MOUTH DAILY Patient taking differently: Take 500 mg by mouth daily with breakfast.  10/05/19   Shelva Majestic, MD  Multiple Vitamins-Minerals (PRESERVISION AREDS 2 PO) Take 1 tablet by mouth 2 (two) times daily.    [provider]  Olmesartan-amLODIPine-HCTZ (TRIBENZOR) 40-10-12.5 MG TABS Take 1 tablet by mouth every morning. Patient taking differently: Take 1 tablet by mouth daily.  10/05/19   Shelva Majestic, MD  polyethylene glycol (MIRALAX / GLYCOLAX) 17 g packet Take 17 g by mouth 2 (two) times daily. Reported on 03/14/2016 01/03/20   Elease Etienne, MD  Rotigotine 3 MG/24HR PT24 Place 3 mg  onto the skin at bedtime. 03/31/19   Marin Olp, MD  sodium chloride flush (NS) 0.9 % SOLN 5 mLs by Intracatheter route every 8 (eight) hours. 01/03/20   Rayburn, Floyce Stakes, PA-C    Allergies    Codeine  Review of Systems   Review of Systems  All other systems reviewed and are negative.   Physical Exam Updated Vital Signs BP 130/67 (BP Location: Right Arm)   Pulse 86   Resp (!) 28   LMP  (LMP Unknown)   SpO2 97%   Physical Exam Vitals and nursing note reviewed.  Constitutional:      General: She is not in acute distress.    Appearance: She is well-developed.  HENT:     Head: Normocephalic and atraumatic.  Eyes:     Conjunctiva/sclera: Conjunctivae normal.  Cardiovascular:     Rate and Rhythm: Normal rate and regular rhythm.     Heart sounds: No murmur.  Pulmonary:     Effort: No respiratory distress.     Breath sounds: Normal breath sounds.     Comments: Mild tachypnea Abdominal:     Palpations: Abdomen is soft.     Tenderness: There is no abdominal tenderness.  Musculoskeletal:        General: Normal range of motion.     Cervical back: Neck supple.  Skin:    General: Skin is warm and dry.  Neurological:     Mental Status: She is  alert and oriented to person, place, and time.     Comments: Responds to commands, and answers questions appropriately, but seems drowsy  Psychiatric:        Mood and Affect: Mood normal.        Behavior: Behavior normal.     ED Results / Procedures / Treatments   Labs (all labs ordered are listed, but only abnormal results are displayed) Labs Reviewed  LACTIC ACID, PLASMA - Abnormal; Notable for the following components:      Result Value   Lactic Acid, Venous 2.0 (*)    All other components within normal limits  COMPREHENSIVE METABOLIC PANEL - Abnormal; Notable for the following components:   Potassium 2.9 (*)    CO2 20 (*)    Glucose, Bld 175 (*)    BUN 24 (*)    Calcium 8.1 (*)    Total Protein 5.7 (*)    Albumin 3.0 (*)    AST 494 (*)    ALT 344 (*)    Alkaline Phosphatase 187 (*)    Total Bilirubin 1.6 (*)    GFR calc non Af Amer 58 (*)    All other components within normal limits  CBC WITH DIFFERENTIAL/PLATELET - Abnormal; Notable for the following components:   RBC 3.05 (*)    Hemoglobin 8.9 (*)    HCT 28.9 (*)    Neutro Abs 8.8 (*)    Lymphs Abs 0.5 (*)    All other components within normal limits  PROTIME-INR - Abnormal; Notable for the following components:   Prothrombin Time 17.1 (*)    INR 1.4 (*)    All other components within normal limits  CULTURE, BLOOD (ROUTINE X 2)  CULTURE, BLOOD (ROUTINE X 2)  URINE CULTURE  APTT  LACTIC ACID, PLASMA  URINALYSIS, ROUTINE W REFLEX MICROSCOPIC  POC SARS CORONAVIRUS 2 AG -  ED    EKG EKG Interpretation  Date/Time:  Tuesday January 25 2020 00:53:20 EST Ventricular Rate:  84 PR Interval:  QRS Duration: 118 QT Interval:  398 QTC Calculation: 471 R Axis:   -66 Text Interpretation: Sinus rhythm Left anterior fascicular block Low voltage, extremity leads Confirmed by Ross Marcus ([id number]) on 01/25/2020 1:28:39 AM   Radiology CT ABDOMEN PELVIS W CONTRAST  Result Date: 01/25/2020 CLINICAL DATA:  Fever  and abdominal pain, recent percutaneous cholecystostomy EXAM: CT ABDOMEN AND PELVIS WITH CONTRAST TECHNIQUE: Multidetector CT imaging of the abdomen and pelvis was performed using the standard protocol following bolus administration of intravenous contrast. CONTRAST:  OMNIPAQUE IOHEXOL 300 MG/ML  SOLN COMPARISON:  12/29/2019 FINDINGS: Lower chest: Hypoventilatory changes are seen at the lung bases. Hepatobiliary: There is mild residual gallbladder wall thickening, though markedly improved since prior CT. Gallbladder is only minimally distended. The liver is unremarkable. Pancreas: Unremarkable. No pancreatic ductal dilatation or surrounding inflammatory changes. Spleen: Normal in size without focal abnormality. Adrenals/Urinary Tract: The adrenals, kidneys, and bladder are stable. No acute findings. Stomach/Bowel: No bowel obstruction or ileus. Moderate retained stool within the distal colon. Vascular/Lymphatic: Stable mild dilatation of the infrarenal abdominal aorta measuring 2.5 cm. Extensive aortic atherosclerosis. No pathologic adenopathy. Reproductive: Status post hysterectomy. No adnexal masses. Other: No abdominal wall hernia or abnormality. No abdominopelvic ascites. Musculoskeletal: No acute or destructive bony lesions. Reconstructed images demonstrate no additional findings. IMPRESSION: 1. Residual gallbladder wall thickening, though markedly improved since prior study. Persistent underlying cholecystitis cannot be excluded. 2. Moderate fecal retention. 3. Otherwise stable exam. Electronically Signed   By: Sharlet Salina M.D.   On: 01/25/2020 02:55   DG Chest Port 1 View  Result Date: 01/25/2020 CLINICAL DATA:  Chest pain for 1 day EXAM: PORTABLE CHEST 1 VIEW COMPARISON:  04/29/2016 FINDINGS: Single frontal view of the chest is obtained, with the patient rotated toward the right. Evaluation is limited by positioning and portable AP technique. Cardiac silhouette is grossly stable. Continued  ectasia of the thoracic aorta. No airspace disease, effusion, or pneumothorax. Chronic right humeral infarct. IMPRESSION: 1. Stable exam allowing for differences in positioning and technique. No acute process. Electronically Signed   By: Sharlet Salina M.D.   On: 01/25/2020 01:22    Procedures .Critical Care Performed by: Roxy Horseman, PA-C Authorized by: Roxy Horseman, PA-C   Critical care provider statement:    Critical care time (minutes):  35   Critical care was necessary to treat or prevent imminent or life-threatening deterioration of the following conditions:  Sepsis   Critical care was time spent personally by me on the following activities:  Discussions with consultants, evaluation of patient's response to treatment, examination of patient, ordering and performing treatments and interventions, ordering and review of laboratory studies, ordering and review of radiographic studies, pulse oximetry, re-evaluation of patient's condition, obtaining history from patient or surrogate and review of old charts   (including critical care time)  Medications Ordered in ED Medications  ceFEPIme (MAXIPIME) 2 g in sodium chloride 0.9 % 100 mL IVPB (has no administration in time range)  metroNIDAZOLE (FLAGYL) IVPB 500 mg (has no administration in time range)  vancomycin (VANCOCIN) IVPB 1000 mg/200 mL premix (has no administration in time range)    ED Course  I have reviewed the triage vital signs and the nursing notes.  Pertinent labs & imaging results that were available during my care of the patient were reviewed by me and considered in my medical decision making (see chart for details).    MDM Rules/Calculators/A&P  Patient with admission for acute cholecystitis on 1/28.  She is a poor surgical candidate, and therefore was treated by interventional radiology with a drain.  She states that the drain fell out early.  He had been doing well until tonight when her son  noticed that she seemed out of breath.  She does complain of some abdominal pain and is noted to be tachypneic.  Rectal temperature was 100.2.  She is tachypneic to 28.  Heart rate is normal.  Blood pressures have been stable.  Code sepsis activated.  High suspicion for recurrent cholecystitis.  CT reveals persistent gallbladder wall thickening, but improved appearance from prior scan.  Today, she does have notably elevated LFTs.  She will likely require repeat MRCP.  Patient seen by discussed with Dr. Wilkie Aye, who agrees with plan for admission.  Appreciate Dr. Toniann Fail for admitting the patient.   Final Clinical Impression(s) / ED Diagnoses Final diagnoses:  Elevated LFTs  Thickening of wall of gallbladder    Rx / DC Orders ED Discharge Orders    None       Roxy Horseman, PA-C 01/25/20 0910    Shon Baton, MD 01/25/20 [phone]

## 2020-01-25 NOTE — Telephone Encounter (Signed)
Called nursing home twice to schedule perc chole, no answer. Left vm both times with no return call. AW

## 2020-01-25 NOTE — Consult Note (Signed)
   [facility] CM Inpatient Consult   01/25/2020  Cheryl Atkinson 10-12-1931 [id number]  Alerted to the patient's admission by [facility] RN for follow up.  Patient screened for  unplanned readmission score  and for hospitalizations to check if potential [facility] Care Management services are needed.  Review of patient's medical record from History and Physical which includes but not limited to and reveals patient is:   Cheryl Atkinson is a 86 y.o. female with history of paroxysmal atrial fibrillation recently diagnosed, hypertension, prediabetes who was recently admitted for acute cholecystitis and discharged to rehab on January 03, 2020 about 22 days ago at that time was managed conservatively with cholecystostomy drain.  Was discharged from the rehab yesterday to home and patient was found to be weak.  Is not known when the cholecystostomy drain was removed or dislodged it is supposed to be on for 8 weeks.  Last night when patient's son tried to ambulate the patient to the bathroom patient was weak was complaining of shortness of breath and also was febrile with temperature 100.5 F.  Patient was brought to the ER.  Not have any chest pain nausea vomiting or diarrhea.  Primary Care Provider is Tana Conch, MD  Pharmacy is: [facility]   Plan:  Follow up with inpatient [facility] team for disposition and needs and alert of referral for post hospital follow up. Continue to follow progress and disposition to assess for post hospital care management needs.    Please place a [facility] Care Management consult as appropriate and for questions contact:   Charlesetta Shanks, RN BSN CCM [facility]  [phone] business mobile phone Toll free office [phone]  Fax number: [phone] [email or web address]

## 2020-01-25 NOTE — Progress Notes (Signed)
Subjective: CC: Abdominal pain We were consulted for suspect recurrent cholecystitis. Patient reports that she returned home from SNF on Monday. Yesterday she began having severe epigastric fever, abdominal pain with associated regurg.  She thought this was indigestion. She is not sure if her symptoms are worse with eating.   Brief hx -Admitted 1/27 for acute cholecystitis -Gen Surg consulted 1/28 -Perc Chole Tube placed by IR 1/28 -Discharged 2/1 to SNF -Perc Chole tube dislodged at SNF -IR note from 2/12 after [place] "Closure of percutaneous cholecystostomy tract due to catheter removal. The tract could not be catheterized for tube replacement. 2. I reviewed the findings with the patient. She is at risk for recurrent cholecystitis due to her known cholelithiasis. We would need to hold or reverse her anticoagulation for cholecystostomy catheter placement. It may be more appropriate to have a low threshold to allow the gallbladder to declare itself, instead of preemptively proceeding with catheter replacement."  ROS: See above, otherwise other systems negative   Objective: Vital signs in last 24 hours: Temp:  [97.9 F (36.6 C)-100.2 F (37.9 C)] 97.9 F (36.6 C) (02/23 0732) Pulse Rate:  [57-89] 69 (02/23 0732) Resp:  [18-28] 25 (02/23 0732) BP: (97-142)/(53-78) 133/78 (02/23 0732) SpO2:  [96 %-100 %] 100 % (02/23 0732) Weight:  [65.9 kg] 65.9 kg (02/23 0400)    Intake/Output from previous day: 02/22 0701 - 02/23 0700 In: 550 [IV Piggyback:550] Out: -  Intake/Output this shift: No intake/output data recorded.  PE: Gen:  Alert, NAD, pleasant Heart: regular  Pulm:  CTAB, no W/R/R, effort normal Abd: Soft, ND, tenderness of the epigastrium and RUQ w/o r/r/g. +BS Ext:  No LE edema  Psych: A&Ox3  Skin: no rashes noted, warm and dry  Lab Results:  Recent Labs    01/25/20 0100 01/25/20 0731  WBC 10.2 12.0*  HGB 8.9* 9.9*  HCT 28.9* 31.5*  PLT 173 174    BMET Recent Labs    01/25/20 0100 01/25/20 0731  NA 140 140  K 2.9* 3.5  CL 107 107  CO2 20* 22  GLUCOSE 175* 109*  BUN 24* 24*  CREATININE 0.90 0.87  CALCIUM 8.1* 8.1*   PT/INR Recent Labs    01/25/20 0100  LABPROT 17.1*  INR 1.4*   CMP     Component Value Date/Time   NA 140 01/25/2020 0731   K 3.5 01/25/2020 0731   CL 107 01/25/2020 0731   CO2 22 01/25/2020 0731   GLUCOSE 109 (H) 01/25/2020 0731   BUN 24 (H) 01/25/2020 0731   CREATININE 0.87 01/25/2020 0731   CREATININE 0.76 08/28/2015 1025   CALCIUM 8.1 (L) 01/25/2020 0731   PROT 6.2 (L) 01/25/2020 0731   PROT 6.9 05/25/2018 0955   ALBUMIN 3.1 (L) 01/25/2020 0731   ALBUMIN 4.2 05/25/2018 0955   AST 334 (H) 01/25/2020 0731   ALT 330 (H) 01/25/2020 0731   ALKPHOS 187 (H) 01/25/2020 0731   BILITOT 1.6 (H) 01/25/2020 0731   BILITOT 0.4 05/25/2018 0955   GFRNONAA >60 01/25/2020 0731   GFRAA >60 01/25/2020 0731   Lipase     Component Value Date/Time   LIPASE 16 12/28/2019 2330       Studies/Results: CT ABDOMEN PELVIS W CONTRAST  Result Date: 01/25/2020 CLINICAL DATA:  Fever and abdominal pain, recent percutaneous cholecystostomy EXAM: CT ABDOMEN AND PELVIS WITH CONTRAST TECHNIQUE: Multidetector CT imaging of the abdomen and pelvis was performed using the standard protocol following bolus administration  of intravenous contrast. CONTRAST:  OMNIPAQUE IOHEXOL 300 MG/ML  SOLN COMPARISON:  12/29/2019 FINDINGS: Lower chest: Hypoventilatory changes are seen at the lung bases. Hepatobiliary: There is mild residual gallbladder wall thickening, though markedly improved since prior CT. Gallbladder is only minimally distended. The liver is unremarkable. Pancreas: Unremarkable. No pancreatic ductal dilatation or surrounding inflammatory changes. Spleen: Normal in size without focal abnormality. Adrenals/Urinary Tract: The adrenals, kidneys, and bladder are stable. No acute findings. Stomach/Bowel: No bowel  obstruction or ileus. Moderate retained stool within the distal colon. Vascular/Lymphatic: Stable mild dilatation of the infrarenal abdominal aorta measuring 2.5 cm. Extensive aortic atherosclerosis. No pathologic adenopathy. Reproductive: Status post hysterectomy. No adnexal masses. Other: No abdominal wall hernia or abnormality. No abdominopelvic ascites. Musculoskeletal: No acute or destructive bony lesions. Reconstructed images demonstrate no additional findings. IMPRESSION: 1. Residual gallbladder wall thickening, though markedly improved since prior study. Persistent underlying cholecystitis cannot be excluded. 2. Moderate fecal retention. 3. Otherwise stable exam. Electronically Signed   By: Sharlet Salina M.D.   On: 01/25/2020 02:55   DG Chest Port 1 View  Result Date: 01/25/2020 CLINICAL DATA:  Chest pain for 1 day EXAM: PORTABLE CHEST 1 VIEW COMPARISON:  04/29/2016 FINDINGS: Single frontal view of the chest is obtained, with the patient rotated toward the right. Evaluation is limited by positioning and portable AP technique. Cardiac silhouette is grossly stable. Continued ectasia of the thoracic aorta. No airspace disease, effusion, or pneumothorax. Chronic right humeral infarct. IMPRESSION: 1. Stable exam allowing for differences in positioning and technique. No acute process. Electronically Signed   By: Sharlet Salina M.D.   On: 01/25/2020 01:22    Anti-infectives: Anti-infectives (From admission, onward)   Start     Dose/Rate Route Frequency Ordered Stop   01/25/20 1200  piperacillin-tazobactam (ZOSYN) IVPB 3.375 g     3.375 g 12.5 mL/hr over 240 Minutes Intravenous Every 8 hours 01/25/20 0434     01/25/20 0115  ceFEPIme (MAXIPIME) 2 g in sodium chloride 0.9 % 100 mL IVPB     2 g 200 mL/hr over 30 Minutes Intravenous  Once 01/25/20 0100 01/25/20 0202   01/25/20 0115  metroNIDAZOLE (FLAGYL) IVPB 500 mg     500 mg 100 mL/hr over 60 Minutes Intravenous  Once 01/25/20 0100 01/25/20 0221    01/25/20 0115  vancomycin (VANCOCIN) IVPB 1000 mg/200 mL premix  Status:  Discontinued     1,000 mg 200 mL/hr over 60 Minutes Intravenous  Once 01/25/20 0100 01/25/20 0104   01/25/20 0115  vancomycin (VANCOREADY) IVPB 1250 mg/250 mL     1,250 mg 166.7 mL/hr over 90 Minutes Intravenous  Once 01/25/20 0104 01/25/20 0338       Assessment/Plan GERD HTN HLD PVD on plavix - followed by Dr. Allyson Sabal, hold plavix. She is unsure of last dose Hx A. Fib - Per chart review patient is on Elqiuis. She is unsure if she has been taking this.  COPD Pre-DM Hypothyroidism RLS  Acute Cholecystitis -Admitted 1/27 for acute cholecystitis -Gen Surg consulted 1/28 -Perc Chole Tube placed by IR 1/28 -Discharged 2/1 to SNF -Perc Chole tube dislodged at SNF - IR evaluated on 2/12.  Percutaneous cholecystostomy tract was closed on 2/12 ultrasound.  - Readmitted 2/23 w/ acute cholecystitis based on labs and imaging  -Will ask IR to replace percutaneous cholecystostomy tube - Cont IV abx   FEN - NPO VTE - SCDs, okay for chemical prophylaxis. Please hold Plavix and Eliquis  ID - Zosyn 2/23 >>  LOS: 0 days    Jacinto Halim , [facility] 01/25/2020, 9:05 AM Please see Amion for pager number during day hours 7:00am-4:30pm

## 2020-01-25 NOTE — Progress Notes (Signed)
PHARMACY - PHYSICIAN COMMUNICATION CRITICAL VALUE ALERT - BLOOD CULTURE IDENTIFICATION (BCID)  Cheryl Atkinson is an 86 y.o. female who presented to [facility] on 01/25/2020  Assessment:  78 yof presenting with intra-abdominal infection, found to have BCx growing GNR in 2 of 4 bottles (both aerobic). BCID positive for klebsiella pneumoniae, no KPC detected.  Name of physician (or Provider) ContactedCarmelia Roller, N  Current antibiotics: Zosyn  Changes to prescribed antibiotics recommended:  D/c Zosyn. Transition to ceftriaxone 2g IV q24h  Results for orders placed or performed during the hospital encounter of 01/25/20  Blood Culture ID Panel (Reflexed) (Collected: 01/25/2020  1:05 AM)  Result Value Ref Range   Enterococcus species NOT DETECTED NOT DETECTED   Listeria monocytogenes NOT DETECTED NOT DETECTED   Staphylococcus species NOT DETECTED NOT DETECTED   Staphylococcus aureus (BCID) NOT DETECTED NOT DETECTED   Streptococcus species NOT DETECTED NOT DETECTED   Streptococcus agalactiae NOT DETECTED NOT DETECTED   Streptococcus pneumoniae NOT DETECTED NOT DETECTED   Streptococcus pyogenes NOT DETECTED NOT DETECTED   Acinetobacter baumannii NOT DETECTED NOT DETECTED   Enterobacteriaceae species DETECTED (A) NOT DETECTED   Enterobacter cloacae complex NOT DETECTED NOT DETECTED   Escherichia coli NOT DETECTED NOT DETECTED   Klebsiella oxytoca NOT DETECTED NOT DETECTED   Klebsiella pneumoniae DETECTED (A) NOT DETECTED   Proteus species NOT DETECTED NOT DETECTED   Serratia marcescens NOT DETECTED NOT DETECTED   Carbapenem resistance NOT DETECTED NOT DETECTED   Haemophilus influenzae NOT DETECTED NOT DETECTED   Neisseria meningitidis NOT DETECTED NOT DETECTED   Pseudomonas aeruginosa NOT DETECTED NOT DETECTED   Candida albicans NOT DETECTED NOT DETECTED   Candida glabrata NOT DETECTED NOT DETECTED   Candida krusei NOT DETECTED NOT DETECTED   Candida parapsilosis NOT DETECTED NOT  DETECTED   Candida tropicalis NOT DETECTED NOT DETECTED    Babs Bertin, PharmD, BCPS Please check AMION for all [facility] contact numbers Clinical Pharmacist 01/25/2020 5:38 PM

## 2020-01-26 LAB — BASIC METABOLIC PANEL
Anion gap: 11 (ref 5–15)
BUN: 12 mg/dL (ref 8–23)
CO2: 23 mmol/L (ref 22–32)
Calcium: 8.5 mg/dL — ABNORMAL LOW (ref 8.9–10.3)
Chloride: 107 mmol/L (ref 98–111)
Creatinine, Ser: 0.75 mg/dL (ref 0.44–1.00)
GFR calc Af Amer: >60 mL/min (ref >60)
GFR calc non Af Amer: >60 mL/min (ref >60)
Glucose, Bld: 79 mg/dL (ref 70–99)
Potassium: 3 mmol/L — ABNORMAL LOW (ref 3.5–5.1)
Sodium: 141 mmol/L (ref 135–145)

## 2020-01-26 LAB — GLUCOSE, CAPILLARY
Glucose-Capillary: 114 mg/dL — ABNORMAL HIGH (ref 70–99)
Glucose-Capillary: 154 mg/dL — ABNORMAL HIGH (ref 70–99)
Glucose-Capillary: 71 mg/dL (ref 70–99)
Glucose-Capillary: 71 mg/dL (ref 70–99)
Glucose-Capillary: 73 mg/dL (ref 70–99)
Glucose-Capillary: 79 mg/dL (ref 70–99)
Glucose-Capillary: 90 mg/dL (ref 70–99)

## 2020-01-26 LAB — CBC
HCT: 28.4 % — ABNORMAL LOW (ref 36.0–46.0)
Hemoglobin: 9.1 g/dL — ABNORMAL LOW (ref 12.0–15.0)
MCH: 29.5 pg (ref 26.0–34.0)
MCHC: 32 g/dL (ref 30.0–36.0)
MCV: 92.2 fL (ref 80.0–100.0)
Platelets: 167 10*3/uL (ref 150–400)
RBC: 3.08 MIL/uL — ABNORMAL LOW (ref 3.87–5.11)
RDW: 14.2 % (ref 11.5–15.5)
WBC: 8.9 10*3/uL (ref 4.0–10.5)
nRBC: 0 % (ref 0.0–0.2)

## 2020-01-26 MED ORDER — PANTOPRAZOLE SODIUM 40 MG IV SOLR
40.0000 mg | INTRAVENOUS | Status: DC
  Administered 2020-01-26 – 2020-01-28 (×3): 40 mg via INTRAVENOUS
  Filled 2020-01-26 (×3): qty 40

## 2020-01-26 NOTE — Progress Notes (Signed)
Per Juliette Alcide in IR, due to back up of current cases pt's IR procedure will be rescheduled until tomorrow am.

## 2020-01-26 NOTE — Plan of Care (Signed)

## 2020-01-26 NOTE — Progress Notes (Signed)
Pt has been NPO since midnight awaiting IR procedure.  Called IR to determine when procedure will be performed and advised they are working the patient in to their schedule.  They will try to get her worked in today but have had other procedures that have taken precedence.  Dr. Carmelia Roller advised and gave VO for pt to have regular diet if procedure not performed today, then NPO again at midnight.

## 2020-01-26 NOTE — Consult Note (Signed)
Chief Complaint: Patient was seen in consultation today for acute cholecystitis  Referring Physician(s): Dr. Redmond Pulling  Supervising Physician: Corrie Mckusick  Patient Status: [facility] - In-pt  History of Present Illness: Cheryl Atkinson is a 86 y.o. female with past medical history of acute cholecystitis previously admitted 1/27 during which time she underwent cholecystostomy tube placement 1/28.  She was ultimately discharged to a SNF, however her tube became dislodged.  She returned to IR 2/12 for tube replacement, however the tract was found completely healed without the ability to recannulate. After discussion with the patient, plan was made to trial tube removal with a low threshold to replace if needed.  Unfortunately, she has returned with acute abdominal pain.  HIDA obtained overnight showed non-filling of the gall bladder.  She is currently hemodynamically stable after resuscitation and initiation of IV antibiotics. Of note, she does have bacteremia with positive blood cultures.   Met with patient at bedside.  She is comfortable and feeling much better since admission.  She is agreeable to having tube replaced.  She has been NPO.  She does not take blood thinners.    Past Medical History:  Diagnosis Date  . Anemia   . Arthritis    "shoulders" (05/04/2015)  . Cellulitis of right lower extremity 11/27/2013  . Chronic lower back pain   . Constipation   . GERD (gastroesophageal reflux disease)   . History of hiatal hernia   . Hypertension   . Osteoporosis   . Peripheral arterial disease ([place])   . Restless leg syndrome   . Thyroid disease     Past Surgical History:  Procedure Laterality Date  . ABDOMINAL AORTAGRAM  05/04/2015   Procedure: Abdominal Aortagram;  Surgeon: Lorretta Harp, MD;  Location: [place] [facility];  Service: Cardiovascular;;  . APPENDECTOMY    . CATARACT EXTRACTION W/ INTRAOCULAR LENS  IMPLANT, BILATERAL Bilateral   . I & D EXTREMITY Right 12/22/2016   Procedure: IRRIGATION AND DEBRIDEMENT EXTREMITY;  Surgeon: Milly Jakob, MD;  Location: [place];  Service: Orthopedics;  Laterality: Right;  . IR PATIENT EVAL TECH 0-60 MINS  12/30/2019  . PERIPHERAL VASCULAR CATHETERIZATION N/A 05/04/2015   Procedure: Lower Extremity Angiography;  Surgeon: Lorretta Harp, MD;  Location: [place] [facility];  Service: Cardiovascular;  Laterality: N/A;  . PERIPHERAL VASCULAR CATHETERIZATION  05/04/2015   Procedure: Peripheral Vascular Intervention;  Surgeon: Lorretta Harp, MD;  Location: [place] [facility];  Service: Cardiovascular;;  RCIA - 7x22 ICAST  . PERIPHERAL VASCULAR CATHETERIZATION Right 09/04/2015   Procedure: Peripheral Vascular Atherectomy;  Surgeon: Lorretta Harp, MD;  Location: [place] [facility];  Service: Cardiovascular;  Laterality: Right;  SFA  . sfa Right 09/04/2015   de balloon  . THYROID SURGERY Right ?2013   "had goiter taken off my neck"  . TONSILLECTOMY    . VAGINAL HYSTERECTOMY      Allergies: Codeine  Medications: Prior to Admission medications   Medication Sig Start Date End Date Taking? Authorizing Provider  acetaminophen (TYLENOL) 325 MG tablet Take 2 tablets (650 mg total) by mouth every 6 (six) hours as needed for mild pain, moderate pain or fever (or Fever >/= 101). 01/03/20   Hongalgi, Lenis Dickinson, MD  Albuterol Sulfate (PROAIR RESPICLICK) 035 (90 Base) MCG/ACT AEPB Inhale 2 puffs into the lungs every 6 (six) hours as needed (shortness of breath from COPD). 12/29/18   Marin Olp, MD  apixaban (ELIQUIS) 5 MG TABS tablet Take 1  tablet (5 mg total) by mouth 2 (two) times daily. Start 01/04/2020. 01/04/20   Modena Jansky, MD  atorvastatin (LIPITOR) 40 MG tablet Take 1 tablet (40 mg total) by mouth daily. 10/07/19   Marin Olp, MD  cephALEXin (KEFLEX) 500 MG capsule Take 1 capsule (500 mg total) by mouth in the morning, at noon, in the evening, and at bedtime. Discontinue after 01/08/2020 doses. 01/03/20   Hongalgi, Lenis Dickinson, MD   clopidogrel (PLAVIX) 75 MG tablet Take 1 tablet (75 mg total) by mouth daily with breakfast. MUST KEEP APPOINTMENT 07/20/19 WITH DR Gwenlyn Found FOR FUTURE REFILLS 06/11/19   Lorretta Harp, MD  metFORMIN (GLUCOPHAGE) 500 MG tablet TAKE 1 (1) TABLET BY MOUTH DAILY Patient taking differently: Take 500 mg by mouth daily with breakfast.  10/05/19   Marin Olp, MD  Multiple Vitamins-Minerals (PRESERVISION AREDS 2 PO) Take 1 tablet by mouth 2 (two) times daily.    [provider]  Olmesartan-amLODIPine-HCTZ (TRIBENZOR) 40-10-12.5 MG TABS Take 1 tablet by mouth every morning. Patient taking differently: Take 1 tablet by mouth daily.  10/05/19   Marin Olp, MD  polyethylene glycol (MIRALAX / GLYCOLAX) 17 g packet Take 17 g by mouth 2 (two) times daily. Reported on 03/14/2016 01/03/20   Modena Jansky, MD  Rotigotine 3 MG/24HR PT24 Place 3 mg onto the skin at bedtime. 03/31/19   Marin Olp, MD  sodium chloride flush (NS) 0.9 % SOLN 5 mLs by Intracatheter route every 8 (eight) hours. 01/03/20   Rayburn, Floyce Stakes, PA-C     Family History  Problem Relation Age of Onset  . Heart disease Father   . Heart attack Father   . Heart disease Mother   . Coronary artery disease Other   . Atrial fibrillation Sister   . Stroke Maternal Grandmother   . Cancer Paternal Grandmother     Social History   Socioeconomic History  . Marital status: Widowed    Spouse name: Not on file  . Number of children: Not on file  . Years of education: Not on file  . Highest education level: Not on file  Occupational History  . Not on file  Tobacco Use  . Smoking status: Former Smoker    Packs/day: 0.50    Years: 60.00    Pack years: 30.00    Types: Cigarettes    Quit date: 04/02/2015    Years since quitting: 4.8  . Smokeless tobacco: Never Used  Substance and Sexual Activity  . Alcohol use: No  . Drug use: No  . Sexual activity: Not Currently  Other Topics Concern  . Not on file  Social History  Narrative   Widowed 2013. 2 sons. 1 grandchild.    Son can help on weekends. Sister and sister in law could help as well. Thinks she may stop driving in next few years.       Retired from [employer].       Hobbies: time at home and with family      HCPOA: sister-in-law and brother. Margarite Gouge.       DNR/DNI      Regular exercise: none   Caffeine use: cup of coffee    Social Determinants of Health   Financial Resource Strain:   . Difficulty of Paying Living Expenses: Not on file  Food Insecurity:   . Worried About Charity fundraiser in the Last Year: Not on file  . Ran Out of Food in  the Last Year: Not on file  Transportation Needs:   . Lack of Transportation (Medical): Not on file  . Lack of Transportation (Non-Medical): Not on file  Physical Activity:   . Days of Exercise per Week: Not on file  . Minutes of Exercise per Session: Not on file  Stress:   . Feeling of Stress : Not on file  Social Connections:   . Frequency of Communication with Friends and Family: Not on file  . Frequency of Social Gatherings with Friends and Family: Not on file  . Attends Religious Services: Not on file  . Active Member of Clubs or Organizations: Not on file  . Attends Archivist Meetings: Not on file  . Marital Status: Not on file     Review of Systems: A 12 point ROS discussed and pertinent positives are indicated in the HPI above.  All other systems are negative.  Review of Systems  Constitutional: Negative for fatigue and fever.  Respiratory: Negative for cough and shortness of breath.   Cardiovascular: Negative for chest pain.  Gastrointestinal: Positive for abdominal pain and nausea. Negative for abdominal distention, constipation and diarrhea.  Genitourinary: Negative for dysuria.  Psychiatric/Behavioral: Negative for behavioral problems and confusion.    Vital Signs: BP (!) 150/64 (BP Location: Right Arm)   Pulse 68   Temp 98.2 F (36.8 C) (Oral)   Resp  (!) 24   Ht '5\' 6"'$  (1.676 m)   Wt 145 lb 4.5 oz (65.9 kg)   LMP  (LMP Unknown)   SpO2 97%   BMI 23.45 kg/m   Physical Exam Vitals and nursing note reviewed.  Constitutional:      General: She is not in acute distress.    Appearance: She is not ill-appearing.  HENT:     Mouth/Throat:     Mouth: Mucous membranes are moist.     Pharynx: Oropharynx is clear.  Cardiovascular:     Rate and Rhythm: Normal rate and regular rhythm.  Pulmonary:     Effort: Pulmonary effort is normal. No respiratory distress.  Abdominal:     General: Abdomen is flat. There is no distension.     Palpations: Abdomen is soft.     Tenderness: There is no abdominal tenderness.     Comments: Well-healed prior cholecystostomy  Skin:    General: Skin is warm and dry.  Neurological:     General: No focal deficit present.     Mental Status: She is alert and oriented to person, place, and time. Mental status is at baseline.  Psychiatric:        Mood and Affect: Mood normal.        Behavior: Behavior normal.        Thought Content: Thought content normal.        Judgment: Judgment normal.      MD Evaluation Airway: WNL Heart: WNL Abdomen: WNL Chest/ Lungs: WNL ASA  Classification: 3 Mallampati/Airway Score: One   Imaging: NM Hepatobiliary Liver Func  Result Date: 01/25/2020 CLINICAL DATA:  Prior cholecystostomy tube. Concern for acute cholecystitis. EXAM: NUCLEAR MEDICINE HEPATOBILIARY IMAGING TECHNIQUE: Sequential images of the abdomen were obtained out to 60 minutes following intravenous administration of radiopharmaceutical. RADIOPHARMACEUTICALS:  5.25 mCi Tc-45m Choletec IV COMPARISON:  CT 04/23/2020, 12/30/2019 FINDINGS: Prompt clearance from the blood pool and homogeneous uptake within liver. Counts are present in the small bowel by 45 minutes. There is activity in the porta hepatis in the expected location of the cystic duct  however the gallbladder fails to fill. Imaging was performed up to 2  hours and the gallbladder failed to fill. Patient could ot receive morphine due somnolence and shallow respirations. IMPRESSION: Gallbladder failed to fill over 2 hour interval concerning for cystic duct obstruction. Cystic duct appears to partially fill but the gallbladder does not fill. Patent common bile duct. These results will be called to the ordering clinician or representative by the Radiologist Assistant, and communication documented in the PACS or zVision Dashboard. Electronically Signed   By: Suzy Bouchard M.D.   On: 01/25/2020 15:29   CT ABDOMEN PELVIS W CONTRAST  Result Date: 01/25/2020 CLINICAL DATA:  Fever and abdominal pain, recent percutaneous cholecystostomy EXAM: CT ABDOMEN AND PELVIS WITH CONTRAST TECHNIQUE: Multidetector CT imaging of the abdomen and pelvis was performed using the standard protocol following bolus administration of intravenous contrast. CONTRAST:  174m OMNIPAQUE IOHEXOL 300 MG/ML  SOLN COMPARISON:  12/29/2019 FINDINGS: Lower chest: Hypoventilatory changes are seen at the lung bases. Hepatobiliary: There is mild residual gallbladder wall thickening, though markedly improved since prior CT. Gallbladder is only minimally distended. The liver is unremarkable. Pancreas: Unremarkable. No pancreatic ductal dilatation or surrounding inflammatory changes. Spleen: Normal in size without focal abnormality. Adrenals/Urinary Tract: The adrenals, kidneys, and bladder are stable. No acute findings. Stomach/Bowel: No bowel obstruction or ileus. Moderate retained stool within the distal colon. Vascular/Lymphatic: Stable mild dilatation of the infrarenal abdominal aorta measuring 2.5 cm. Extensive aortic atherosclerosis. No pathologic adenopathy. Reproductive: Status post hysterectomy. No adnexal masses. Other: No abdominal wall hernia or abnormality. No abdominopelvic ascites. Musculoskeletal: No acute or destructive bony lesions. Reconstructed images demonstrate no additional findings.  IMPRESSION: 1. Residual gallbladder wall thickening, though markedly improved since prior study. Persistent underlying cholecystitis cannot be excluded. 2. Moderate fecal retention. 3. Otherwise stable exam. Electronically Signed   By: MRanda NgoM.D.   On: 01/25/2020 02:55   CT ABDOMEN PELVIS W CONTRAST  Result Date: 12/29/2019 CLINICAL DATA:  Sharp constant right lower quadrant abdominal pain with nausea and constipation EXAM: CT ABDOMEN AND PELVIS WITH CONTRAST TECHNIQUE: Multidetector CT imaging of the abdomen and pelvis was performed using the standard protocol following bolus administration of intravenous contrast. CONTRAST:  1025mOMNIPAQUE IOHEXOL 300 MG/ML  SOLN COMPARISON:  Radiograph Apr 15, 2011, CT lumbar spine January 04, 2011 FINDINGS: Lower chest: Bandlike areas of atelectasis in the lung base with more dependent atelectasis in the posterior right lower lobe. Mild cardiomegaly with coronary artery atherosclerosis. No pericardial effusion. Hepatobiliary: No focal liver lesions are identified. Gallbladder is markedly hydropic mural thickening. There is extensive pericholecystic inflammation with mucosal hyperemia and marked intra and extrahepatic biliary ductal dilatation to the level of the pancreatic head. Inflammation extends inferolaterally to the gallbladder fossa into the right pericolic gutter as well as with stranding and phlegmon about the proximal duodenal sweep likely secondary to the gallbladder process. Pancreas: Pancreas is largely atrophic. No pancreatic ductal dilatation. Spleen: Normal in size without focal abnormality. Adrenals/Urinary Tract: 1.4 cm nodule in the body of the left adrenal gland is nonspecific. None no right adrenal nodules. 1 cm fluid attenuation cyst seen in the interpolar left kidney. No worrisome renal lesions. No hydronephrosis or urolithiasis. Urinary bladder appears circumferentially thickened with multiple bladder diverticula seen along the right lateral  aspect of the bladder. Some hazy stranding is notable about these multiple bladder diverticula. Stomach/Bowel: Distal esophagus and stomach are unremarkable. Stranding adjacent the proximal duodenum is likely secondary to the gallbladder. No small bowel dilatation  or wall thickening. Cecum is slightly displaced into the midline abdomen. The appendix is surgically absent. No colonic dilatation or wall thickening. Scattered colonic diverticula without focal pericolonic inflammation to suggest diverticulitis. Vascular/Lymphatic: Atherosclerotic plaque within the normal caliber aorta. Focal fusiform ectasia of the infrarenal abdominal aorta measuring up to 2.5 cm in diameter. Reactive adenopathy in the upper abdomen. No pathologically enlarged lymph nodes in the abdomen or pelvis. Reproductive: Uterus is surgically absent. No concerning adnexal lesions. Other: No abdominopelvic free fluid or free gas. Stranding and inflammation extends from the gallbladder to the right paracolic gutter. Additional hazy stranding is noted upon the multiple bladder diverticula in the pelvis. No bowel containing hernias. Musculoskeletal: The osseous structures appear diffusely demineralized which may limit detection of small or nondisplaced fractures. Multilevel degenerative changes are present in the imaged portions of the spine. 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 No acute osseous abnormality or suspicious osseous lesion. IMPRESSION: 1. Markedly hydropic gallbladder with extensive pericholecystic inflammation and marked intra and extrahepatic biliary ductal dilatation to the level of the pancreatic head. No aggressive or invasive features are seen. Findings are compatible with acute cholecystitis. 2. Extensive intra and extrahepatic biliary ductal dilatation with acute tapering at the level of the pancreatic head. No visible gallstones. Could consider right upper quadrant  ultrasound or MRCP for further evaluation. 3. Urinary bladder appears circumferentially thickened with multiple bladder diverticula seen along the right lateral aspect of the bladder. Some hazy stranding is noted about these bladder diverticula. Findings are suggestive of cystitis. Correlate with urinalysis. 4. Indeterminate 1.4 cm nodule in the body of the left adrenal gland. If there is no history of malignancy, further characterization could be performed with adrenal mass protocol CT or MRI. This recommendation follows ACR consensus guidelines: Management of Incidental Adrenal Masses: A White Paper of the ACR Incidental Findings Committee. J Am Coll Radiol 2017;14:1038-1044. 5. Focal fusiform ectasia of the infrarenal abdominal aorta measuring up to 2.5 cm in diameter. Ectatic abdominal aorta at risk for aneurysm development. Recommend followup by ultrasound in 5 years. This recommendation follows ACR consensus guidelines: White Paper of the ACR Incidental Findings Committee II on Vascular Findings. J Am Coll Radiol 2013; 10:789-794. Aortic aneurysm NOS (ICD10-I71.9) 6.  Aortic Atherosclerosis (ICD10-I70.0). These results were called by telephone at the time of interpretation on 12/29/2019 at 4:03 am to provider Dr Dayna Barker, who verbally acknowledged these results. Electronically Signed   By: Lovena Le M.D.   On: 12/29/2019 04:03   MR 3D Recon At Scanner  Result Date: 12/30/2019 CLINICAL DATA:  RIGHT upper quadrant pain. Cholecystitis suspected on CT 1 day prior. EXAM: MRI ABDOMEN WITHOUT AND WITH CONTRAST (INCLUDING MRCP) TECHNIQUE: Multiplanar multisequence MR imaging of the abdomen was performed both before and after the administration of intravenous contrast. Heavily T2-weighted images of the biliary and pancreatic ducts were obtained, and three-dimensional MRCP images were rendered by post processing. CONTRAST:  6.29m GADAVIST GADOBUTROL 1 MMOL/ML IV SOLN COMPARISON:  CT 12/29/2019 FINDINGS: Lower chest:  Small bilateral pleural effusions. Hepatobiliary: The gallbladder is markedly distended to 5.7 cm. There is extensive gallbladder wall thickening and pericholecystic fluid. Multiple gallstones layer dependently within the fundus of the gallbladder. Within the neck of the bladder, there is an obstructing calculus measuring approximately 5 mm on image 16/7. This potential calculus is also seen on image 33/13 of the MRCP series. While the common bile duct is dilated to 10 mm, there is no discrete filling defect within  the common bile duct. The ductal dilatation extends the level of the ampulla. There is mild intrahepatic duct dilatation involving the LEFT hepatic lobe. No focal hepatic lesion. Postcontrast imaging is severely degraded by patient respiratory motion. Pancreas: Pancreatic duct is normal caliber. No pancreatic inflammation identified Spleen: Normal spleen Adrenals/urinary tract: Adrenal glands and kidneys are normal. The ureters and bladder normal. Stomach/Bowel: Stomach, small bowel, appendix, and cecum are normal. The colon and rectosigmoid colon are normal. Vascular/Lymphatic: Abdominal aorta is normal caliber. No periportal or retroperitoneal adenopathy. No pelvic adenopathy. Reproductive: Other: No free fluid. Musculoskeletal: No aggressive osseous lesion. IMPRESSION: 1. Acute cholecystitis. Probable calculus lodged within the neck of the gallbladder. 2. Dilatation of the common hepatic duct and common bile duct without obstructing lesion identified. 3. Minimal intrahepatic duct dilatation. 4. No pancreatic duct dilatation.  No evidence of pancreatitis. Electronically Signed   By: Suzy Bouchard M.D.   On: 12/30/2019 07:53   DG Chest Port 1 View  Result Date: 01/25/2020 CLINICAL DATA:  Chest pain for 1 day EXAM: PORTABLE CHEST 1 VIEW COMPARISON:  04/29/2016 FINDINGS: Single frontal view of the chest is obtained, with the patient rotated toward the right. Evaluation is limited by positioning and  portable AP technique. Cardiac silhouette is grossly stable. Continued ectasia of the thoracic aorta. No airspace disease, effusion, or pneumothorax. Chronic right humeral infarct. IMPRESSION: 1. Stable exam allowing for differences in positioning and technique. No acute process. Electronically Signed   By: Randa Ngo M.D.   On: 01/25/2020 01:22   MR ABDOMEN MRCP W WO CONTAST  Result Date: 12/30/2019 CLINICAL DATA:  RIGHT upper quadrant pain. Cholecystitis suspected on CT 1 day prior. EXAM: MRI ABDOMEN WITHOUT AND WITH CONTRAST (INCLUDING MRCP) TECHNIQUE: Multiplanar multisequence MR imaging of the abdomen was performed both before and after the administration of intravenous contrast. Heavily T2-weighted images of the biliary and pancreatic ducts were obtained, and three-dimensional MRCP images were rendered by post processing. CONTRAST:  6.65m GADAVIST GADOBUTROL 1 MMOL/ML IV SOLN COMPARISON:  CT 12/29/2019 FINDINGS: Lower chest: Small bilateral pleural effusions. Hepatobiliary: The gallbladder is markedly distended to 5.7 cm. There is extensive gallbladder wall thickening and pericholecystic fluid. Multiple gallstones layer dependently within the fundus of the gallbladder. Within the neck of the bladder, there is an obstructing calculus measuring approximately 5 mm on image 16/7. This potential calculus is also seen on image 33/13 of the MRCP series. While the common bile duct is dilated to 10 mm, there is no discrete filling defect within the common bile duct. The ductal dilatation extends the level of the ampulla. There is mild intrahepatic duct dilatation involving the LEFT hepatic lobe. No focal hepatic lesion. Postcontrast imaging is severely degraded by patient respiratory motion. Pancreas: Pancreatic duct is normal caliber. No pancreatic inflammation identified Spleen: Normal spleen Adrenals/urinary tract: Adrenal glands and kidneys are normal. The ureters and bladder normal. Stomach/Bowel: Stomach,  small bowel, appendix, and cecum are normal. The colon and rectosigmoid colon are normal. Vascular/Lymphatic: Abdominal aorta is normal caliber. No periportal or retroperitoneal adenopathy. No pelvic adenopathy. Reproductive: Other: No free fluid. Musculoskeletal: No aggressive osseous lesion. IMPRESSION: 1. Acute cholecystitis. Probable calculus lodged within the neck of the gallbladder. 2. Dilatation of the common hepatic duct and common bile duct without obstructing lesion identified. 3. Minimal intrahepatic duct dilatation. 4. No pancreatic duct dilatation.  No evidence of pancreatitis. Electronically Signed   By: SSuzy BouchardM.D.   On: 12/30/2019 07:53   IR PATIENT EVAL TECH 0-60  MINS  Result Date: 12/30/2019 Araceli Bouche     12/30/2019  2:44 PM Dr. Anselm Pancoast [place] pt and determined that procedure would be safer to perform in CT  IR ABDOMEN US LIMITED  Result Date: 01/14/2020 CLINICAL DATA:  Suspected calculus cholecystitis status post percutaneous cholecystostomy catheter placement 12/30/2019. Patient states catheter came to be partially withdrawn 2 weeks ago. No interval output. Patient is on anticoagulation. EXAM: PERCUTANEOUS CHOLECYSTOSTOMY CATHETER EXCHANGE (ATTEMPTED) ULTRASOUND ABDOMEN LIMITED COMPARISON:  12/30/2019 FINDINGS: On exam, the previously placed pigtail cholecystostomy catheter is completely external to the body. The skin entry site has largely healed with no residual scab, dimple, or other access site. The region was prepped and draped in usual sterile fashion and attempts made to probe in find the tract using a 5 French steerable Kumpe catheter but ultimately these were unsuccessful as the site appeared to be completely healed up. Limited ultrasound then was performed demonstrating physiologic distention of the gallbladder with innumerable small layering shadowing calculi. No sonographic Murphy's sign. IMPRESSION: 1. Closure of percutaneous cholecystostomy tract due to catheter  removal. The tract could not be catheterized for tube replacement. 2. I reviewed the findings with the patient. She is at risk for recurrent cholecystitis due to her known cholelithiasis. We would need to hold or reverse her anticoagulation for cholecystostomy catheter placement. It may be more appropriate to have a low threshold to allow the gallbladder to declare itself, instead of preemptively proceeding with catheter replacement. Electronically Signed   By: Lucrezia Europe M.D.   On: 01/14/2020 14:52   CT IMAGE GUIDED DRAINAGE BY PERCUTANEOUS CATHETER  Result Date: 12/30/2019 INDICATION: 86 year old with acute calculus cholecystitis. Patient needs a percutaneous cholecystostomy tube. EXAM: CT-GUIDED CHOLECYSTOSTOMY TUBE PLACEMENT MEDICATIONS: Moderate sedation ANESTHESIA/SEDATION: Moderate (conscious) sedation was employed during this procedure. A total of Versed 1.5 mg and Fentanyl 75 mcg was administered intravenously. Moderate Sedation Time: 25 minutes. The patient's level of consciousness and vital signs were monitored continuously by radiology nursing throughout the procedure under my direct supervision. FLUOROSCOPY TIME:  None COMPLICATIONS: None immediate. PROCEDURE: Informed written consent was obtained from the patient after a thorough discussion of the procedural risks, benefits and alternatives. All questions were addressed. The gallbladder was evaluated with ultrasound. Large distended abnormal gallbladder was identified but a large amount of surrounding bowel gas was also identified. Due to the overlying bowel gas, the procedure was performed with CT. Patient was placed supine on CT scanner and CT images were obtained. A timeout was performed prior to the initiation of the procedure. Right upper abdomen was prepped with chlorhexidine and sterile field was created. Maximal barrier sterile technique was utilized including caps, mask, sterile gowns, sterile gloves, sterile drape, hand hygiene and skin  antiseptic. Skin and soft tissues were anesthetized with 1% lidocaine. Using CT guidance, an 18 gauge trocar needle was directed into the gallbladder. Yellow bilious fluid was rapidly draining from the 18 gauge needle. Stiff Amplatz wire was advanced into the gallbladder and the tract was dilated to accommodate a 10.2 [place] multipurpose drain. 130 mL of yellowish bile was removed. There was some sediment within the aspirated bile. Fluid sample was sent for culture. Follow up CT images were obtained. Catheter was sutured to skin and attached to a gravity bag. FINDINGS: Markedly distended gallbladder with surrounding inflammatory changes. 10.2 French drain was successfully placed within the gallbladder. Gallbladder appears to be decompressed at the end of the procedure. IMPRESSION: CT-guided cholecystostomy tube placement. Electronically Signed   By: Quita Skye  Anselm Pancoast M.D.   On: 12/30/2019 17:13    Labs:  CBC: Recent Labs    01/03/20 0636 01/25/20 0100 01/25/20 0731 01/26/20 0644  WBC 8.7 10.2 12.0* 8.9  HGB 10.7* 8.9* 9.9* 9.1*  HCT 33.5* 28.9* 31.5* 28.4*  PLT 266 173 174 167    COAGS: Recent Labs    12/30/19 1210 01/25/20 0100  INR 1.4* 1.4*  APTT  --  28    BMP: Recent Labs    01/03/20 0636 01/25/20 0100 01/25/20 0731 01/26/20 0644  NA 141 140 140 141  K 3.8 2.9* 3.5 3.0*  CL 106 107 107 107  CO2 23 20* 22 23  GLUCOSE 119* 175* 109* 79  BUN 13 24* 24* 12  CALCIUM 8.8* 8.1* 8.1* 8.5*  CREATININE 0.65 0.90 0.87 0.75  GFRNONAA >60 58* >60 >60  GFRAA >60 >60 >60 >60    LIVER FUNCTION TESTS: Recent Labs    01/02/20 0413 01/03/20 0636 01/25/20 0100 01/25/20 0731  BILITOT 0.5 0.8 1.6* 1.6*  AST 33 23 494* 334*  ALT 26 25 344* 330*  ALKPHOS 54 61 187* 187*  PROT 5.7* 5.9* 5.7* 6.2*  ALBUMIN 2.4* 2.5* 3.0* 3.1*    TUMOR MARKERS: No results for input(s): AFPTM, CEA, CA199, CHROMGRNA in the last 8760 hours.  Assessment and Plan: Acute Cholecystitis s/p  cholecystostomy tube placement 12/29/18 Patient presents with recurrent cholecystitis after recent tube dislodgement.  Her tube fell out almost 2 weeks ago and track could not be recannulated initially.   Trial of tube liberation has failed.  She returns now with abdominal pain, sepsis, bacteremia, and recurrent cholecystitis with non-filling of the gall bladder demonstrated on HIDA.  Blood cultures positive for Enterobacteriaceae and Klebsiella pneumoniae. She is in need of tube replacement and has been approved for procedure.  She is currently stable on IV abx. VSS, afebrile. WBC 8.9  She is on Eliquis and Plavix at home.  INR 1.4 01/25/20 Held since admission.   Plan to proceed with cholecystostomy tube placement as schedule allows.  NPO.   Risks and benefits discussed with the patient including, but not limited to bleeding, infection, gallbladder perforation, bile leak, sepsis or even death.  All of the patient's questions were answered, patient is agreeable to proceed. Consent signed and in chart.  Thank you for this interesting consult.  I greatly enjoyed meeting Cheryl Atkinson and look forward to participating in their care.  A copy of this report was sent to the requesting provider on this date.  Electronically Signed: Docia Barrier, PA 01/26/2020, 1:07 PM   I spent a total of 40 Minutes    in face to face in clinical consultation, greater than 50% of which was counseling/coordinating care for acute cholecystitis.

## 2020-01-26 NOTE — Progress Notes (Addendum)
Subjective: CC: Abdominal pain Patient still feels like she has "indigestion". Indicates this is pain in her epigastrium. No n/v. HIDA was positive yesterday. Supposed to get her perc chole drain with IR today.  ROS: See above, otherwise other systems negative   Objective: Vital signs in last 24 hours: Temp:  [97.5 F (36.4 C)-98.3 F (36.8 C)] 98.1 F (36.7 C) (02/24 0346) Pulse Rate:  [64-66] 64 (02/24 0500) Resp:  [21-23] 23 (02/24 0500) BP: (141)/(70) 141/70 (02/23 1928) SpO2:  [96 %-97 %] 96 % (02/24 0500) Last BM Date: 01/26/20  Intake/Output from previous day: 02/23 0701 - 02/24 0700 In: 1634.9 [P.O.:60; I.V.:1446.9; IV Piggyback:128] Out: 700 [Urine:700] Intake/Output this shift: Total I/O In: 840 [I.V.:840] Out: -   PE: Gen:  Alert, NAD, pleasant Heart: Irr rhythm with regular rate  Pulm:  CTAB, no W/R/R, effort normal Abd: Soft, ND, tenderness of the epigastrium and RUQ. +BS Ext:  No LE edema  Psych: A&Ox3  Skin: no rashes noted, warm and dry  Lab Results:  Recent Labs    01/25/20 0731 01/26/20 0644  WBC 12.0* 8.9  HGB 9.9* 9.1*  HCT 31.5* 28.4*  PLT 174 167   BMET Recent Labs    01/25/20 0731 01/26/20 0644  NA 140 141  K 3.5 3.0*  CL 107 107  CO2 22 23  GLUCOSE 109* 79  BUN 24* 12  CREATININE 0.87 0.75  CALCIUM 8.1* 8.5*   PT/INR Recent Labs    01/25/20 0100  LABPROT 17.1*  INR 1.4*   CMP     Component Value Date/Time   NA 141 01/26/2020 0644   K 3.0 (L) 01/26/2020 0644   CL 107 01/26/2020 0644   CO2 23 01/26/2020 0644   GLUCOSE 79 01/26/2020 0644   BUN 12 01/26/2020 0644   CREATININE 0.75 01/26/2020 0644   CREATININE 0.76 08/28/2015 1025   CALCIUM 8.5 (L) 01/26/2020 0644   PROT 6.2 (L) 01/25/2020 0731   PROT 6.9 05/25/2018 0955   ALBUMIN 3.1 (L) 01/25/2020 0731   ALBUMIN 4.2 05/25/2018 0955   AST 334 (H) 01/25/2020 0731   ALT 330 (H) 01/25/2020 0731   ALKPHOS 187 (H) 01/25/2020 0731   BILITOT 1.6 (H)  01/25/2020 0731   BILITOT 0.4 05/25/2018 0955   GFRNONAA >60 01/26/2020 0644   GFRAA >60 01/26/2020 0644   Lipase     Component Value Date/Time   LIPASE 16 12/28/2019 2330       Studies/Results: NM Hepatobiliary Liver Func  Result Date: 01/25/2020 CLINICAL DATA:  Prior cholecystostomy tube. Concern for acute cholecystitis. EXAM: NUCLEAR MEDICINE HEPATOBILIARY IMAGING TECHNIQUE: Sequential images of the abdomen were obtained out to 60 minutes following intravenous administration of radiopharmaceutical. RADIOPHARMACEUTICALS:  5.25 mCi Tc-22m  Choletec IV COMPARISON:  CT 04/23/2020, 12/30/2019 FINDINGS: Prompt clearance from the blood pool and homogeneous uptake within liver. Counts are present in the small bowel by 45 minutes. There is activity in the porta hepatis in the expected location of the cystic duct however the gallbladder fails to fill. Imaging was performed up to 2 hours and the gallbladder failed to fill. Patient could ot receive morphine due somnolence and shallow respirations. IMPRESSION: Gallbladder failed to fill over 2 hour interval concerning for cystic duct obstruction. Cystic duct appears to partially fill but the gallbladder does not fill. Patent common bile duct. These results will be called to the ordering clinician or representative by the Radiologist Assistant, and communication documented in the PACS  or zVision Dashboard. Electronically Signed   By: Genevive Bi M.D.   On: 01/25/2020 15:29   CT ABDOMEN PELVIS W CONTRAST  Result Date: 01/25/2020 CLINICAL DATA:  Fever and abdominal pain, recent percutaneous cholecystostomy EXAM: CT ABDOMEN AND PELVIS WITH CONTRAST TECHNIQUE: Multidetector CT imaging of the abdomen and pelvis was performed using the standard protocol following bolus administration of intravenous contrast. CONTRAST:  OMNIPAQUE IOHEXOL 300 MG/ML  SOLN COMPARISON:  12/29/2019 FINDINGS: Lower chest: Hypoventilatory changes are seen at the lung bases.  Hepatobiliary: There is mild residual gallbladder wall thickening, though markedly improved since prior CT. Gallbladder is only minimally distended. The liver is unremarkable. Pancreas: Unremarkable. No pancreatic ductal dilatation or surrounding inflammatory changes. Spleen: Normal in size without focal abnormality. Adrenals/Urinary Tract: The adrenals, kidneys, and bladder are stable. No acute findings. Stomach/Bowel: No bowel obstruction or ileus. Moderate retained stool within the distal colon. Vascular/Lymphatic: Stable mild dilatation of the infrarenal abdominal aorta measuring 2.5 cm. Extensive aortic atherosclerosis. No pathologic adenopathy. Reproductive: Status post hysterectomy. No adnexal masses. Other: No abdominal wall hernia or abnormality. No abdominopelvic ascites. Musculoskeletal: No acute or destructive bony lesions. Reconstructed images demonstrate no additional findings. IMPRESSION: 1. Residual gallbladder wall thickening, though markedly improved since prior study. Persistent underlying cholecystitis cannot be excluded. 2. Moderate fecal retention. 3. Otherwise stable exam. Electronically Signed   By: Sharlet Salina M.D.   On: 01/25/2020 02:55   DG Chest Port 1 View  Result Date: 01/25/2020 CLINICAL DATA:  Chest pain for 1 day EXAM: PORTABLE CHEST 1 VIEW COMPARISON:  04/29/2016 FINDINGS: Single frontal view of the chest is obtained, with the patient rotated toward the right. Evaluation is limited by positioning and portable AP technique. Cardiac silhouette is grossly stable. Continued ectasia of the thoracic aorta. No airspace disease, effusion, or pneumothorax. Chronic right humeral infarct. IMPRESSION: 1. Stable exam allowing for differences in positioning and technique. No acute process. Electronically Signed   By: Sharlet Salina M.D.   On: 01/25/2020 01:22    Anti-infectives: Anti-infectives (From admission, onward)   Start     Dose/Rate Route Frequency Ordered Stop   01/25/20  2230  cefTRIAXone (ROCEPHIN) 2 g in sodium chloride 0.9 % 100 mL IVPB     2 g 200 mL/hr over 30 Minutes Intravenous Every 24 hours 01/25/20 1741     01/25/20 1430  ceFAZolin (ANCEF) IVPB 2g/100 mL premix     2 g 200 mL/hr over 30 Minutes Intravenous To Radiology 01/25/20 1416 01/25/20 1639   01/25/20 1200  piperacillin-tazobactam (ZOSYN) IVPB 3.375 g  Status:  Discontinued     3.375 g 12.5 mL/hr over 240 Minutes Intravenous Every 8 hours 01/25/20 0434 01/25/20 1741   01/25/20 0115  ceFEPIme (MAXIPIME) 2 g in sodium chloride 0.9 % 100 mL IVPB     2 g 200 mL/hr over 30 Minutes Intravenous  Once 01/25/20 0100 01/25/20 0202   01/25/20 0115  metroNIDAZOLE (FLAGYL) IVPB 500 mg     500 mg 100 mL/hr over 60 Minutes Intravenous  Once 01/25/20 0100 01/25/20 0221   01/25/20 0115  vancomycin (VANCOCIN) IVPB 1000 mg/200 mL premix  Status:  Discontinued     1,000 mg 200 mL/hr over 60 Minutes Intravenous  Once 01/25/20 0100 01/25/20 0104   01/25/20 0115  vancomycin (VANCOREADY) IVPB 1250 mg/250 mL     1,250 mg 166.7 mL/hr over 90 Minutes Intravenous  Once 01/25/20 0104 01/25/20 0338       Assessment/Plan GERD HTN HLD PVD  on plavix - followed by Dr. Gwenlyn Found, hold plavix. She is unsure of last dose Hx A. Fib - Per chart review patient is on Elqiuis. Hold. She is unsure if she has been taking this.  COPD Pre-DM Hypothyroidism RLS  Acute Cholecystitis -Admitted 1/27 for acute cholecystitis. Perc Chole Tube placed by IR 1/28. Discharged 2/1 to SNF. Perc Chole tube dislodged at SNF. Readmitted 2/23 w/ acute cholecystitis based on labs and imaging.  - HIDA positive. IR to replace Perc Chole tube today  - Cont IV abx   FEN - NPO for procedure. Okay for CLD after Perc Chole tube from our standpoint.  VTE - SCDs, okay for chemical prophylaxis. Please hold Plavix and Eliquis  ID - Zosyn 2/23. Rocephin 2/23 >>   LOS: 1 day    Jillyn Ledger , [facility] 01/26/2020, 8:01  AM Please see Amion for pager number during day hours 7:00am-4:30pm

## 2020-01-26 NOTE — Progress Notes (Signed)
PROGRESS NOTE    Cheryl Atkinson  FYB:[id number] DOB: 1931-01-23 DOA: 01/25/2020 PCP: Shelva Majestic, MD     Brief Narrative:  Patient is an 86 year old female with a history of atrial fibrillation, hypertension, and prediabetes who was recently admitted for acute cholecystitis and discharged to rehab on February 1.  A cholecystectomy tube had been placed but had accidentally fallen out while at rehab.  The night after she was discharged to rehab, she described weakness, shortness of breath, and also had a temperature of 100.5 F.  She is brought to the emergency department.  It was determined she needed to have the tube replaced.  General surgery consulted.   New events last 24 hours / Subjective: Percutaneous cholecystotomy tube to be replaced today with the interventional radiology team.  The patient reports she is hungry.  Pain is improved.  No nausea or vomiting.  Denies current fevers.  Assessment & Plan:   Principal Problem:   Sepsis (HCC)  Afebrile, white count now normal  Rocephin 2 g every 24 hours  Tube placement  Monitor CBC   Active Problems:   Acute cholangitis  Rocephin per cultures  Tube placement today; heart healthy diet following procedure  Morphine 1-2 mg every 3 hours as needed for severe pain    Essential hypertension  Hold home meds for now    COPD (chronic obstructive pulmonary disease) (HCC)  Albuterol prn    DNR (do not resuscitate)    Anemia  Stable  Monitor CBC    Atrial fibrillation (HCC)  Hold Eliquis until after procedure  DVT prophylaxis: SCDs Code Status: DNR Family Communication: Self Coming From: SNF Disposition Plan: To be determined Barriers to Discharge: Clinical improvement  Consultants:   General surgery  Interventional radiology   Antimicrobials:  Anti-infectives (From admission, onward)   Start     Dose/Rate Route Frequency Ordered Stop   01/25/20 2230  cefTRIAXone (ROCEPHIN) 2 g in sodium chloride 0.9 % 100 mL  IVPB     2 g 200 mL/hr over 30 Minutes Intravenous Every 24 hours 01/25/20 1741     01/25/20 1430  ceFAZolin (ANCEF) IVPB 2g/100 mL premix     2 g 200 mL/hr over 30 Minutes Intravenous To Radiology 01/25/20 1416 01/25/20 1639   01/25/20 1200  piperacillin-tazobactam (ZOSYN) IVPB 3.375 g  Status:  Discontinued     3.375 g 12.5 mL/hr over 240 Minutes Intravenous Every 8 hours 01/25/20 0434 01/25/20 1741   01/25/20 0115  ceFEPIme (MAXIPIME) 2 g in sodium chloride 0.9 % 100 mL IVPB     2 g 200 mL/hr over 30 Minutes Intravenous  Once 01/25/20 0100 01/25/20 0202   01/25/20 0115  metroNIDAZOLE (FLAGYL) IVPB 500 mg     500 mg 100 mL/hr over 60 Minutes Intravenous  Once 01/25/20 0100 01/25/20 0221   01/25/20 0115  vancomycin (VANCOCIN) IVPB 1000 mg/200 mL premix  Status:  Discontinued     1,000 mg 200 mL/hr over 60 Minutes Intravenous  Once 01/25/20 0100 01/25/20 0104   01/25/20 0115  vancomycin (VANCOREADY) IVPB 1250 mg/250 mL     1,250 mg 166.7 mL/hr over 90 Minutes Intravenous  Once 01/25/20 0104 01/25/20 0338        Objective: Vitals:   01/26/20 0346 01/26/20 0500 01/26/20 0818 01/26/20 1113  BP:   131/63 (!) 150/64  Pulse:  64 76 68  Resp:  (!) 23 (!) 26 (!) 24  Temp: 98.1 F (36.7 C)  97.7 F (36.5 C)  98.2 F (36.8 C)  TempSrc: Oral  Oral Oral  SpO2:  96% 98% 97%  Weight:      Height:        Intake/Output Summary (Last 24 hours) at 01/26/2020 1339 Last data filed at 01/26/2020 1114 Gross per 24 hour  Intake 2474.88 ml  Output 1000 ml  Net 1474.88 ml   Filed Weights   01/25/20 0400  Weight: 65.9 kg    Examination:  General exam: Appears calm and comfortable  Respiratory system: Clear to auscultation. Respiratory effort normal. No respiratory distress. No conversational dyspnea.  Cardiovascular system: S1 & S2 heard, RRR. No murmurs. No pedal edema. Gastrointestinal system: Abdomen is nondistended, soft and mild ttp in RUQ. Normal bowel sounds heard. Central  nervous system: Alert and oriented. No focal neurological deficits. Speech clear.  Extremities: Symmetric in appearance  Skin: No rashes, lesions or ulcers on exposed skin  Psychiatry: Mood & affect appropriate.   Data Reviewed: I have personally reviewed following labs and imaging studies  CBC: Recent Labs  Lab 01/25/20 0100 01/25/20 0731 01/26/20 0644  WBC 10.2 12.0* 8.9  NEUTROABS 8.8* 9.9*  --   HGB 8.9* 9.9* 9.1*  HCT 28.9* 31.5* 28.4*  MCV 94.8 94.6 92.2  PLT 173 174 167   Basic Metabolic Panel: Recent Labs  Lab 01/25/20 0100 01/25/20 0731 01/26/20 0644  NA 140 140 141  K 2.9* 3.5 3.0*  CL 107 107 107  CO2 20* 22 23  GLUCOSE 175* 109* 79  BUN 24* 24* 12  CREATININE 0.90 0.87 0.75  CALCIUM 8.1* 8.1* 8.5*   GFR: Estimated Creatinine Clearance: 47.3 mL/min (by C-G formula based on SCr of 0.75 mg/dL). Liver Function Tests: Recent Labs  Lab 01/25/20 0100 01/25/20 0731  AST 494* 334*  ALT 344* 330*  ALKPHOS 187* 187*  BILITOT 1.6* 1.6*  PROT 5.7* 6.2*  ALBUMIN 3.0* 3.1*   Coagulation Profile: Recent Labs  Lab 01/25/20 0100  INR 1.4*   CBG: Recent Labs  Lab 01/25/20 1934 01/25/20 2347 01/26/20 0236 01/26/20 0815 01/26/20 1109  GLUCAP 90 71 79 73 71   Sepsis Labs: Recent Labs  Lab 01/25/20 0100 01/25/20 0340 01/25/20 0731  PROCALCITON  --   --  1.33  LATICACIDVEN 2.0* 1.3 1.2    Recent Results (from the past 240 hour(s))  Blood Culture (routine x 2)     Status: Abnormal (Preliminary result)   Collection Time: 01/25/20  1:05 AM   Specimen: BLOOD LEFT FOREARM  Result Value Ref Range Status   Specimen Description BLOOD LEFT FOREARM  Final   Special Requests   Final    BOTTLES DRAWN AEROBIC AND ANAEROBIC Blood Culture adequate volume   Culture  Setup Time   Final    GRAM NEGATIVE RODS AEROBIC BOTTLE ONLY CRITICAL RESULT CALLED TO, READ BACK BY AND VERIFIED WITH: Cindra Presume PharmD 16:25 01/25/20 ([email or web address])    Culture (A)  Final     KLEBSIELLA PNEUMONIAE SUSCEPTIBILITIES TO FOLLOW Performed at [facility], [address], [place], [place] [address]    Report Status PENDING  Incomplete  Blood Culture (routine x 2)     Status: Abnormal (Preliminary result)   Collection Time: 01/25/20  1:05 AM   Specimen: BLOOD  Result Value Ref Range Status   Specimen Description BLOOD LEFT ANTECUBITAL  Final   Special Requests   Final    BOTTLES DRAWN AEROBIC AND ANAEROBIC Blood Culture adequate volume   Culture  Setup Time  Final    GRAM NEGATIVE RODS AEROBIC BOTTLE ONLY CRITICAL VALUE NOTED.  VALUE IS CONSISTENT WITH PREVIOUSLY REPORTED AND CALLED VALUE. Performed at [facility], [address] [place], [place] [address]    Culture KLEBSIELLA PNEUMONIAE (A)  Final   Report Status PENDING  Incomplete  Blood Culture ID Panel (Reflexed)     Status: Abnormal   Collection Time: 01/25/20  1:05 AM  Result Value Ref Range Status   Enterococcus species NOT DETECTED NOT DETECTED Final   Listeria monocytogenes NOT DETECTED NOT DETECTED Final   Staphylococcus species NOT DETECTED NOT DETECTED Final   Staphylococcus aureus (BCID) NOT DETECTED NOT DETECTED Final   Streptococcus species NOT DETECTED NOT DETECTED Final   Streptococcus agalactiae NOT DETECTED NOT DETECTED Final   Streptococcus pneumoniae NOT DETECTED NOT DETECTED Final   Streptococcus pyogenes NOT DETECTED NOT DETECTED Final   Acinetobacter baumannii NOT DETECTED NOT DETECTED Final   Enterobacteriaceae species DETECTED (A) NOT DETECTED Final    Comment: Enterobacteriaceae represent a large family of gram-negative bacteria, not a single organism. CRITICAL RESULT CALLED TO, READ BACK BY AND VERIFIED WITH: Cindra Presume PharmD 16:25 01/25/20 ([email or web address])    Enterobacter cloacae complex NOT DETECTED NOT DETECTED Final   Escherichia coli NOT DETECTED NOT DETECTED Final   Klebsiella oxytoca NOT DETECTED NOT DETECTED Final   Klebsiella pneumoniae DETECTED (A) NOT  DETECTED Final    Comment: CRITICAL RESULT CALLED TO, READ BACK BY AND VERIFIED WITH: Cindra Presume PharmD 16:25 01/25/20 ([email or web address])    Proteus species NOT DETECTED NOT DETECTED Final   Serratia marcescens NOT DETECTED NOT DETECTED Final   Carbapenem resistance NOT DETECTED NOT DETECTED Final   Haemophilus influenzae NOT DETECTED NOT DETECTED Final   Neisseria meningitidis NOT DETECTED NOT DETECTED Final   Pseudomonas aeruginosa NOT DETECTED NOT DETECTED Final   Candida albicans NOT DETECTED NOT DETECTED Final   Candida glabrata NOT DETECTED NOT DETECTED Final   Candida krusei NOT DETECTED NOT DETECTED Final   Candida parapsilosis NOT DETECTED NOT DETECTED Final   Candida tropicalis NOT DETECTED NOT DETECTED Final    Comment: Performed at [facility], [address] [place], [place] [address]  SARS CORONAVIRUS 2 (TAT 6-24 HRS) Nasopharyngeal Nasopharyngeal Swab     Status: None   Collection Time: 01/25/20  4:00 AM   Specimen: Nasopharyngeal Swab  Result Value Ref Range Status   SARS Coronavirus 2 NEGATIVE NEGATIVE Final    Comment: (NOTE) SARS-CoV-2 target nucleic acids are NOT DETECTED. The SARS-CoV-2 RNA is generally detectable in upper and lower respiratory specimens during the acute phase of infection. Negative results do not preclude SARS-CoV-2 infection, do not rule out co-infections with other pathogens, and should not be used as the sole basis for treatment or other patient management decisions. Negative results must be combined with clinical observations, patient history, and epidemiological information. The expected result is Negative. Fact Sheet for Patients: HairSlick.no Fact Sheet for Healthcare Providers: quierodirigir.com This test is not yet approved or cleared by the [place] FDA and  has been authorized for detection and/or diagnosis of SARS-CoV-2 by FDA under an Emergency Use Authorization (EUA). This  EUA will remain  in effect (meaning this test can be used) for the duration of the COVID-19 declaration under Section 56 4(b)(1) of the Act, 21 U.S.C. section 360bbb-3(b)(1), unless the authorization is terminated or revoked sooner. Performed at [facility], [address] [place], [place] [address]   Urine culture  Status: None (Preliminary result)   Collection Time: 01/25/20  6:46 AM   Specimen: In/Out Cath Urine  Result Value Ref Range Status   Specimen Description IN/OUT CATH URINE  Final   Special Requests NONE  Final   Culture   Final    CULTURE REINCUBATED FOR BETTER GROWTH Performed at [facility], [address] [place], [place] [address]    Report Status PENDING  Incomplete      Radiology Studies: NM Hepatobiliary Liver Func  Result Date: 01/25/2020 CLINICAL DATA:  Prior cholecystostomy tube. Concern for acute cholecystitis. EXAM: NUCLEAR MEDICINE HEPATOBILIARY IMAGING TECHNIQUE: Sequential images of the abdomen were obtained out to 60 minutes following intravenous administration of radiopharmaceutical. RADIOPHARMACEUTICALS:  5.25 mCi Tc-38m  Choletec IV COMPARISON:  CT 04/23/2020, 12/30/2019 FINDINGS: Prompt clearance from the blood pool and homogeneous uptake within liver. Counts are present in the small bowel by 45 minutes. There is activity in the porta hepatis in the expected location of the cystic duct however the gallbladder fails to fill. Imaging was performed up to 2 hours and the gallbladder failed to fill. Patient could ot receive morphine due somnolence and shallow respirations. IMPRESSION: Gallbladder failed to fill over 2 hour interval concerning for cystic duct obstruction. Cystic duct appears to partially fill but the gallbladder does not fill. Patent common bile duct. These results will be called to the ordering clinician or representative by the Radiologist Assistant, and communication documented in the PACS or zVision Dashboard. Electronically  Signed   By: Genevive Bi M.D.   On: 01/25/2020 15:29   CT ABDOMEN PELVIS W CONTRAST  Result Date: 01/25/2020 CLINICAL DATA:  Fever and abdominal pain, recent percutaneous cholecystostomy EXAM: CT ABDOMEN AND PELVIS WITH CONTRAST TECHNIQUE: Multidetector CT imaging of the abdomen and pelvis was performed using the standard protocol following bolus administration of intravenous contrast. CONTRAST:  OMNIPAQUE IOHEXOL 300 MG/ML  SOLN COMPARISON:  12/29/2019 FINDINGS: Lower chest: Hypoventilatory changes are seen at the lung bases. Hepatobiliary: There is mild residual gallbladder wall thickening, though markedly improved since prior CT. Gallbladder is only minimally distended. The liver is unremarkable. Pancreas: Unremarkable. No pancreatic ductal dilatation or surrounding inflammatory changes. Spleen: Normal in size without focal abnormality. Adrenals/Urinary Tract: The adrenals, kidneys, and bladder are stable. No acute findings. Stomach/Bowel: No bowel obstruction or ileus. Moderate retained stool within the distal colon. Vascular/Lymphatic: Stable mild dilatation of the infrarenal abdominal aorta measuring 2.5 cm. Extensive aortic atherosclerosis. No pathologic adenopathy. Reproductive: Status post hysterectomy. No adnexal masses. Other: No abdominal wall hernia or abnormality. No abdominopelvic ascites. Musculoskeletal: No acute or destructive bony lesions. Reconstructed images demonstrate no additional findings. IMPRESSION: 1. Residual gallbladder wall thickening, though markedly improved since prior study. Persistent underlying cholecystitis cannot be excluded. 2. Moderate fecal retention. 3. Otherwise stable exam. Electronically Signed   By: Sharlet Salina M.D.   On: 01/25/2020 02:55   DG Chest Port 1 View  Result Date: 01/25/2020 CLINICAL DATA:  Chest pain for 1 day EXAM: PORTABLE CHEST 1 VIEW COMPARISON:  04/29/2016 FINDINGS: Single frontal view of the chest is obtained, with the patient  rotated toward the right. Evaluation is limited by positioning and portable AP technique. Cardiac silhouette is grossly stable. Continued ectasia of the thoracic aorta. No airspace disease, effusion, or pneumothorax. Chronic right humeral infarct. IMPRESSION: 1. Stable exam allowing for differences in positioning and technique. No acute process. Electronically Signed   By: Sharlet Salina M.D.   On: 01/25/2020 01:22  Scheduled Meds: .  morphine injection  2.5 mg Intravenous Once  . pantoprazole (PROTONIX) IV  40 mg Intravenous Q24H  . Rotigotine  3 mg Transdermal QHS   Continuous Infusions: . sodium chloride 10 mL/hr at 01/25/20 1430  . cefTRIAXone (ROCEPHIN)  IV 2 g (01/25/20 2230)     LOS: 1 day    Time spent: 30 minutes   Shelda Pal, DO [facility] 01/26/2020, 1:39 PM   Available via Epic secure chat 7am-7pm After these hours, please refer to coverage provider listed on [email or web address]

## 2020-01-27 ENCOUNTER — Inpatient Hospital Stay (HOSPITAL_COMMUNITY): Payer: Medicare Other

## 2020-01-27 HISTORY — PX: IR PERC CHOLECYSTOSTOMY: IMG2326

## 2020-01-27 LAB — CBC
HCT: 29.8 % — ABNORMAL LOW (ref 36.0–46.0)
Hemoglobin: 9.7 g/dL — ABNORMAL LOW (ref 12.0–15.0)
MCH: 29.6 pg (ref 26.0–34.0)
MCHC: 32.6 g/dL (ref 30.0–36.0)
MCV: 90.9 fL (ref 80.0–100.0)
Platelets: 183 10*3/uL (ref 150–400)
RBC: 3.28 MIL/uL — ABNORMAL LOW (ref 3.87–5.11)
RDW: 13.9 % (ref 11.5–15.5)
WBC: 8.5 10*3/uL (ref 4.0–10.5)
nRBC: 0 % (ref 0.0–0.2)

## 2020-01-27 LAB — GLUCOSE, CAPILLARY
Glucose-Capillary: 111 mg/dL — ABNORMAL HIGH (ref 70–99)
Glucose-Capillary: 122 mg/dL — ABNORMAL HIGH (ref 70–99)
Glucose-Capillary: 110 mg/dL — ABNORMAL HIGH (ref 70–99)
Glucose-Capillary: 255 mg/dL — ABNORMAL HIGH (ref 70–99)
Glucose-Capillary: 305 mg/dL — ABNORMAL HIGH (ref 70–99)

## 2020-01-27 LAB — BASIC METABOLIC PANEL
Anion gap: 12 (ref 5–15)
BUN: 11 mg/dL (ref 8–23)
CO2: 23 mmol/L (ref 22–32)
Calcium: 8.5 mg/dL — ABNORMAL LOW (ref 8.9–10.3)
Chloride: 105 mmol/L (ref 98–111)
Creatinine, Ser: 0.73 mg/dL (ref 0.44–1.00)
GFR calc Af Amer: >60 mL/min (ref >60)
GFR calc non Af Amer: >60 mL/min (ref >60)
Glucose, Bld: 114 mg/dL — ABNORMAL HIGH (ref 70–99)
Potassium: 3 mmol/L — ABNORMAL LOW (ref 3.5–5.1)
Sodium: 140 mmol/L (ref 135–145)

## 2020-01-27 MED ORDER — LIDOCAINE HCL 1 % IJ SOLN
INTRAMUSCULAR | Status: AC | PRN
  Administered 2020-01-27: 10 mL

## 2020-01-27 MED ORDER — LIDOCAINE HCL 1 % IJ SOLN
INTRAMUSCULAR | Status: AC
  Filled 2020-01-27: qty 20

## 2020-01-27 MED ORDER — SODIUM CHLORIDE 0.9% FLUSH
5.0000 mL | Freq: Three times a day (TID) | INTRAVENOUS | Status: DC
  Administered 2020-01-27 – 2020-01-29 (×6): 5 mL

## 2020-01-27 MED ORDER — FENTANYL CITRATE (PF) 100 MCG/2ML IJ SOLN
INTRAMUSCULAR | Status: AC | PRN
  Administered 2020-01-27: 50 ug via INTRAVENOUS

## 2020-01-27 MED ORDER — MIDAZOLAM HCL 2 MG/2ML IJ SOLN
INTRAMUSCULAR | Status: AC
  Filled 2020-01-27: qty 4

## 2020-01-27 MED ORDER — MIDAZOLAM HCL 2 MG/2ML IJ SOLN
INTRAMUSCULAR | Status: AC | PRN
  Administered 2020-01-27: 1 mg via INTRAVENOUS

## 2020-01-27 MED ORDER — IOHEXOL 300 MG/ML  SOLN
50.0000 mL | Freq: Once | INTRAMUSCULAR | Status: AC | PRN
  Administered 2020-01-27: 10 mL

## 2020-01-27 MED ORDER — FENTANYL CITRATE (PF) 100 MCG/2ML IJ SOLN
INTRAMUSCULAR | Status: AC
  Filled 2020-01-27: qty 4

## 2020-01-27 MED ORDER — POTASSIUM CHLORIDE CRYS ER 20 MEQ PO TBCR
30.0000 meq | EXTENDED_RELEASE_TABLET | Freq: Three times a day (TID) | ORAL | Status: AC
  Administered 2020-01-27 (×3): 30 meq via ORAL
  Filled 2020-01-27 (×3): qty 1

## 2020-01-27 NOTE — Progress Notes (Signed)
Rehab Admissions Coordinator Note:  Patient was screened by Clois Dupes for appropriateness for an Inpatient Acute Rehab Consult per therapy recs. Patient recently at SNF prior to d/c home . Has intermittent assist at home.  At this time, we are recommending Skilled Nursing Facility if caregiver support is not available 24/7 at home. If she has caregiver support, I would recommend  CIR consult for she will need 24/7 supervision level even after a short rehab stay. Please advise.  Clois Dupes RN MSN 01/27/2020, 10:14 PM  I can be reached at [phone].

## 2020-01-27 NOTE — Plan of Care (Signed)

## 2020-01-27 NOTE — Procedures (Signed)
Interventional Radiology Procedure Note  Procedure: Image guided perc chole, 39F drain, trans-fundal. .  Complications: None  Recommendations:  - To gravity - Do not submerge - Routine drain care   Signed,  Yvone Neu. Loreta Ave, DO

## 2020-01-27 NOTE — Evaluation (Signed)
Physical Therapy Evaluation Patient Details Name: Cheryl Atkinson MRN: [id number] DOB: May 05, 1931 Today's Date: 01/27/2020   History of Present Illness  Patient is an 86 year old female with a history of atrial fibrillation, hypertension, and prediabetes who was recently admitted for acute cholecystitis and discharged to rehab on February 1. A cholecystectomy tube had been placed but had accidentally fallen out while at rehab. The night after discharge from rehab, pt presented to ED with weakness, SOB, and increased temperature. S/p replacement of chole tube 01/27/2020.   Clinical Impression  Pt admitted with above. Prior to admission, pt with recent discharge from SNF. She is typically independent with ADL's and mobility using a Rollator. On PT evaluation, pt with significant debility/deconditioning, decreased endurance, and balance impairments. Pt requiring moderate assist for transfers and limited ambulation to chair. She experienced posterior loss of balance, necessitating assist by therapist to lower to chair. Presents as a high fall risk based on decreased gait speed and safety awareness. Recommending post acute rehab to address deficits and maximize functional mobility.     Follow Up Recommendations CIR (pt refusing SNF)    Equipment Recommendations  Wheelchair (measurements PT);Wheelchair cushion (measurements PT);3in1 (PT)    Recommendations for Other Services       Precautions / Restrictions Precautions Precautions: Fall;Other (comment) Precaution Comments: chole drain Restrictions Weight Bearing Restrictions: No      Mobility  Bed Mobility Overal bed mobility: Needs Assistance Bed Mobility: Rolling;Sidelying to Sit Rolling: Min assist Sidelying to sit: Mod assist       General bed mobility comments: MinA to facilitate execution of roll with bed pad, heavy modA to boost up to sitting position  Transfers Overall transfer level: Needs assistance Equipment used: Rolling  walker (2 wheeled) Transfers: Sit to/from Stand Sit to Stand: Mod assist         General transfer comment: ModA to boost up to stand, pt with use of momentum  Ambulation/Gait Ambulation/Gait assistance: Mod assist Gait Distance (Feet): 3 Feet Assistive device: Rolling walker (2 wheeled) Gait Pattern/deviations: Step-to pattern;Decreased stride length;Trunk flexed;Narrow base of support;Shuffle Gait velocity: decreased Gait velocity interpretation: <1.31 ft/sec, indicative of household ambulator General Gait Details: Pt with significant difficulty with stepping initiation, tendency for downward gaze, posterior LOB requiring assist to lower to chair.  Stairs            Wheelchair Mobility    Modified Rankin (Stroke Patients Only)       Balance Overall balance assessment: Needs assistance Sitting-balance support: Feet supported Sitting balance-Leahy Scale: Fair     Standing balance support: Bilateral upper extremity supported Standing balance-Leahy Scale: Poor                               Pertinent Vitals/Pain Pain Assessment: Faces Faces Pain Scale: Hurts little more Pain Location: R vastus lateralis Pain Descriptors / Indicators: Sore Pain Intervention(s): Monitored during session    Home Living Family/patient expects to be discharged to:: Private residence Living Arrangements: Children(son) Available Help at Discharge: Family;Available PRN/intermittently Type of Home: House Home Access: Stairs to enter   Entrance Stairs-Number of Steps: 1 Home Layout: One level Home Equipment: Walker - 4 wheels;Cane - single point      Prior Function Level of Independence: Independent with assistive device(s)         Comments: Uses rollator for ambulation. Does own ADLs, cooks/cleans. Has a housekeeper that comes every other week.     Hand  Dominance   Dominant Hand: Right    Extremity/Trunk Assessment   Upper Extremity Assessment Upper  Extremity Assessment: Generalized weakness    Lower Extremity Assessment Lower Extremity Assessment: Generalized weakness    Cervical / Trunk Assessment Cervical / Trunk Assessment: Kyphotic  Communication   Communication: No difficulties  Cognition Arousal/Alertness: Awake/alert Behavior During Therapy: WFL for tasks assessed/performed Overall Cognitive Status: Impaired/Different from baseline Area of Impairment: Safety/judgement                         Safety/Judgement: Decreased awareness of deficits;Decreased awareness of safety     General Comments: Pt continuously stating she wants to go home despite deficits, stating "it will be better when I get home."      General Comments      Exercises     Assessment/Plan    PT Assessment Patient needs continued PT services  PT Problem List Decreased strength;Decreased activity tolerance;Decreased balance;Decreased mobility;Decreased safety awareness;Decreased cognition       PT Treatment Interventions DME instruction;Gait training;Functional mobility training;Therapeutic activities;Therapeutic exercise;Balance training;Patient/family education    PT Goals (Current goals can be found in the Care Plan section)  Acute Rehab PT Goals Patient Stated Goal: "return home." PT Goal Formulation: With patient Time For Goal Achievement: 02/10/20 Potential to Achieve Goals: Good    Frequency Min 3X/week   Barriers to discharge        Co-evaluation               AM-PAC PT "6 Clicks" Mobility  Outcome Measure Help needed turning from your back to your side while in a flat bed without using bedrails?: A Little Help needed moving from lying on your back to sitting on the side of a flat bed without using bedrails?: A Lot Help needed moving to and from a bed to a chair (including a wheelchair)?: A Lot Help needed standing up from a chair using your arms (e.g., wheelchair or bedside chair)?: A Lot Help needed to walk  in hospital room?: A Lot Help needed climbing 3-5 steps with a railing? : Total 6 Click Score: 12    End of Session Equipment Utilized During Treatment: Gait belt Activity Tolerance: Patient limited by fatigue Patient left: in chair;with call bell/phone within reach;with chair alarm set Nurse Communication: Mobility status PT Visit Diagnosis: Unsteadiness on feet (R26.81);Muscle weakness (generalized) (M62.81);Difficulty in walking, not elsewhere classified (R26.2)    Time: 9147-8295 PT Time Calculation (min) (ACUTE ONLY): 27 min   Charges:   PT Evaluation $PT Eval Moderate Complexity: 1 Mod PT Treatments $Therapeutic Activity: 8-22 mins          Wyona Almas, PT, DPT Acute Rehabilitation Services Pager [phone] Office [phone]   Deno Etienne 01/27/2020, 1:44 PM

## 2020-01-27 NOTE — Progress Notes (Addendum)
Subjective: CC: Cholecystitis Patient to get perc chole tube this morning Notes abdominal pain and indigestion improved. No n/v. Had a jello last night that did not make her symptoms worse.   ROS: See above, otherwise other systems negative   Objective: Vital signs in last 24 hours: Temp:  [97.7 F (36.5 C)-98.7 F (37.1 C)] 98.4 F (36.9 C) (02/25 0735) Pulse Rate:  [60-79] 79 (02/25 0735) Resp:  [18-26] 21 (02/25 0735) BP: (131-158)/(63-89) 158/77 (02/25 0735) SpO2:  [96 %-98 %] 97 % (02/25 0735) Last BM Date: 01/26/20  Intake/Output from previous day: 02/24 0701 - 02/25 0700 In: 1080 [P.O.:240; I.V.:840] Out: 1050 [Urine:750] Intake/Output this shift: Total I/O In: -  Out: 700 [Urine:700]  PE: Gen: Alert, NAD, pleasant Heart: Irr rhythm with regular rate Pulm: CTAB, no W/R/R, effort normal Abd: Soft, ND, minimaltenderness of the epigastrium and RUQ that is much improved from prior days.+BS Ext: No LE edema Psych: A&Ox3  Skin: no rashes noted, warm and dry  Lab Results:  Recent Labs    01/26/20 0644 01/27/20 0605  WBC 8.9 8.5  HGB 9.1* 9.7*  HCT 28.4* 29.8*  PLT 167 183   BMET Recent Labs    01/26/20 0644 01/27/20 0605  NA 141 140  K 3.0* 3.0*  CL 107 105  CO2 23 23  GLUCOSE 79 114*  BUN 12 11  CREATININE 0.75 0.73  CALCIUM 8.5* 8.5*   PT/INR Recent Labs    01/25/20 0100  LABPROT 17.1*  INR 1.4*   CMP     Component Value Date/Time   NA 140 01/27/2020 0605   K 3.0 (L) 01/27/2020 0605   CL 105 01/27/2020 0605   CO2 23 01/27/2020 0605   GLUCOSE 114 (H) 01/27/2020 0605   BUN 11 01/27/2020 0605   CREATININE 0.73 01/27/2020 0605   CREATININE 0.76 08/28/2015 1025   CALCIUM 8.5 (L) 01/27/2020 0605   PROT 6.2 (L) 01/25/2020 0731   PROT 6.9 05/25/2018 0955   ALBUMIN 3.1 (L) 01/25/2020 0731   ALBUMIN 4.2 05/25/2018 0955   AST 334 (H) 01/25/2020 0731   ALT 330 (H) 01/25/2020 0731   ALKPHOS 187 (H) 01/25/2020 0731   BILITOT  1.6 (H) 01/25/2020 0731   BILITOT 0.4 05/25/2018 0955   GFRNONAA >60 01/27/2020 0605   GFRAA >60 01/27/2020 0605   Lipase     Component Value Date/Time   LIPASE 16 12/28/2019 2330       Studies/Results: NM Hepatobiliary Liver Func  Result Date: 01/25/2020 CLINICAL DATA:  Prior cholecystostomy tube. Concern for acute cholecystitis. EXAM: NUCLEAR MEDICINE HEPATOBILIARY IMAGING TECHNIQUE: Sequential images of the abdomen were obtained out to 60 minutes following intravenous administration of radiopharmaceutical. RADIOPHARMACEUTICALS:  5.25 mCi Tc-82m  Choletec IV COMPARISON:  CT 04/23/2020, 12/30/2019 FINDINGS: Prompt clearance from the blood pool and homogeneous uptake within liver. Counts are present in the small bowel by 45 minutes. There is activity in the porta hepatis in the expected location of the cystic duct however the gallbladder fails to fill. Imaging was performed up to 2 hours and the gallbladder failed to fill. Patient could ot receive morphine due somnolence and shallow respirations. IMPRESSION: Gallbladder failed to fill over 2 hour interval concerning for cystic duct obstruction. Cystic duct appears to partially fill but the gallbladder does not fill. Patent common bile duct. These results will be called to the ordering clinician or representative by the Radiologist Assistant, and communication documented in the PACS or zVision  Dashboard. Electronically Signed   By: Genevive Bi M.D.   On: 01/25/2020 15:29    Anti-infectives: Anti-infectives (From admission, onward)   Start     Dose/Rate Route Frequency Ordered Stop   01/25/20 2230  cefTRIAXone (ROCEPHIN) 2 g in sodium chloride 0.9 % 100 mL IVPB     2 g 200 mL/hr over 30 Minutes Intravenous Every 24 hours 01/25/20 1741     01/25/20 1430  ceFAZolin (ANCEF) IVPB 2g/100 mL premix     2 g 200 mL/hr over 30 Minutes Intravenous To Radiology 01/25/20 1416 01/25/20 1639   01/25/20 1200  piperacillin-tazobactam (ZOSYN) IVPB  3.375 g  Status:  Discontinued     3.375 g 12.5 mL/hr over 240 Minutes Intravenous Every 8 hours 01/25/20 0434 01/25/20 1741   01/25/20 0115  ceFEPIme (MAXIPIME) 2 g in sodium chloride 0.9 % 100 mL IVPB     2 g 200 mL/hr over 30 Minutes Intravenous  Once 01/25/20 0100 01/25/20 0202   01/25/20 0115  metroNIDAZOLE (FLAGYL) IVPB 500 mg     500 mg 100 mL/hr over 60 Minutes Intravenous  Once 01/25/20 0100 01/25/20 0221   01/25/20 0115  vancomycin (VANCOCIN) IVPB 1000 mg/200 mL premix  Status:  Discontinued     1,000 mg 200 mL/hr over 60 Minutes Intravenous  Once 01/25/20 0100 01/25/20 0104   01/25/20 0115  vancomycin (VANCOREADY) IVPB 1250 mg/250 mL     1,250 mg 166.7 mL/hr over 90 Minutes Intravenous  Once 01/25/20 0104 01/25/20 0338       Assessment/Plan GERD HTN HLD PVD on plavix - followed by Dr. Allyson Sabal, hold plavix. Hx A. Fib - Per chart review patient is on Elqiuis. Hold.  COPD Pre-DM Hypothyroidism RLS  Klebsiella Bacteremia  AcuteCholecystitis -Admitted 1/27 for acute cholecystitis. Perc Chole Tube placed by IR 1/28. Discharged 2/1 to SNF. Perc Chole tube dislodged at SNF. Readmitted 2/23 w/ acute cholecystitis based on labs and imaging.  - HIDA positive. IR to replace Perc Chole tube today  - Cont IV abx  FEN -NPO for procedure. Okay for diet after Perc Chole tube from our standpoint.  VTE -SCDs, okay for chemical prophylaxis. Please hold Plavix and Eliquis ID -Zosyn 2/23. Rocephin 2/23 >>   LOS: 2 days    Jacinto Halim , [facility] 01/27/2020, 8:15 AM Please see Amion for pager number during day hours 7:00am-4:30pm

## 2020-01-27 NOTE — Progress Notes (Signed)
PROGRESS NOTE    Cheryl Atkinson  PZW:[id number] DOB: [birth year] DOA: 01/25/2020 PCP: Shelva Majestic, MD     Brief Narrative:  Patient is an 86 year old female with a history of atrial fibrillation, hypertension, and prediabetes who was recently admitted for acute cholecystitis and discharged to rehab on February 1.  A cholecystectomy tube had been placed but had accidentally fallen out while at rehab.  The night after she was discharged to rehab, she described weakness, shortness of breath, and also had a temperature of 100.5 F.  She is brought to the emergency department.  It was determined she needed to have the tube replaced.  General surgery consulted.  New events last 24 hours / Subjective: Patient to get her tube today.  Reports feeling hungry.  She is well overall otherwise.  She is interested in going home, preferably not to SNF.  Assessment & Plan:   Principal Problem:   Sepsis (HCC)             Resolved             Rocephin 2 g every 24 hours             Tube placed today             Monitor CBC              Active Problems:   Acute cholangitis             Rocephin per cultures             Tube placement today             Morphine 1-2 mg every 3 hours as needed for severe pain    Essential hypertension             Hold home meds for now    COPD (chronic obstructive pulmonary disease) (HCC)             Albuterol prn    DNR (do not resuscitate)    Anemia             Stable             Monitor CBC    Atrial fibrillation (HCC)             Hold Eliquis until after procedure  DVT prophylaxis: SCDs Code Status: DNR Family Communication: Self; spoke with son Tammy Sours today as well Coming From: SNF Disposition Plan: To be determined; will get PT/OT on board for better dispo given her weakness on admission Barriers to Discharge: Clinical improvement, PT/OT  Consultants:   General surgery  Interventional radiology  Procedures:  Image guided perc chole,  46F drain, trans-fundal  Antimicrobials:  Anti-infectives (From admission, onward)   Start     Dose/Rate Route Frequency Ordered Stop   01/25/20 2230  cefTRIAXone (ROCEPHIN) 2 g in sodium chloride 0.9 % 100 mL IVPB     2 g 200 mL/hr over 30 Minutes Intravenous Every 24 hours 01/25/20 1741     01/25/20 1430  ceFAZolin (ANCEF) IVPB 2g/100 mL premix     2 g 200 mL/hr over 30 Minutes Intravenous To Radiology 01/25/20 1416 01/25/20 1639   01/25/20 1200  piperacillin-tazobactam (ZOSYN) IVPB 3.375 g  Status:  Discontinued     3.375 g 12.5 mL/hr over 240 Minutes Intravenous Every 8 hours 01/25/20 0434 01/25/20 1741   01/25/20 0115  ceFEPIme (MAXIPIME) 2 g in sodium chloride 0.9 % 100 mL IVPB  2 g 200 mL/hr over 30 Minutes Intravenous  Once 01/25/20 0100 01/25/20 0202   01/25/20 0115  metroNIDAZOLE (FLAGYL) IVPB 500 mg     500 mg 100 mL/hr over 60 Minutes Intravenous  Once 01/25/20 0100 01/25/20 0221   01/25/20 0115  vancomycin (VANCOCIN) IVPB 1000 mg/200 mL premix  Status:  Discontinued     1,000 mg 200 mL/hr over 60 Minutes Intravenous  Once 01/25/20 0100 01/25/20 0104   01/25/20 0115  vancomycin (VANCOREADY) IVPB 1250 mg/250 mL     1,250 mg 166.7 mL/hr over 90 Minutes Intravenous  Once 01/25/20 0104 01/25/20 0338        Objective: Vitals:   01/27/20 1005 01/27/20 1010 01/27/20 1015 01/27/20 1024  BP: (!) 155/80 (!) 159/77 (!) 162/73 (!) 158/81  Pulse: 73 75 79 81  Resp: (!) 21 (!) 21 (!) 22 19  Temp:      TempSrc:      SpO2: 100% 99% 100% 99%  Weight:      Height:        Intake/Output Summary (Last 24 hours) at 01/27/2020 1056 Last data filed at 01/27/2020 0735 Gross per 24 hour  Intake 240 ml  Output 1450 ml  Net -1210 ml   Filed Weights   01/25/20 0400  Weight: 65.9 kg    Examination:  General exam: Appears calm and comfortable  Respiratory system: Clear to auscultation. Respiratory effort normal. No respiratory distress. No conversational dyspnea.    Cardiovascular system: S1 & S2 heard, RRR. No murmurs. No pedal edema. Gastrointestinal system: Abdomen is nondistended, soft and nontender today. Normal bowel sounds heard. Central nervous system: Alert and oriented. No focal neurological deficits. Speech clear.  Extremities: Symmetric in appearance  Skin: No rashes, lesions or ulcers on exposed skin  Psychiatry: Judgement and insight appear normal. Mood & affect appropriate.   Data Reviewed: I have personally reviewed following labs and imaging studies  CBC: Recent Labs  Lab 01/25/20 0100 01/25/20 0731 01/26/20 0644 01/27/20 0605  WBC 10.2 12.0* 8.9 8.5  NEUTROABS 8.8* 9.9*  --   --   HGB 8.9* 9.9* 9.1* 9.7*  HCT 28.9* 31.5* 28.4* 29.8*  MCV 94.8 94.6 92.2 90.9  PLT 173 174 167 322   Basic Metabolic Panel: Recent Labs  Lab 01/25/20 0100 01/25/20 0731 01/26/20 0644 01/27/20 0605  NA 140 140 141 140  K 2.9* 3.5 3.0* 3.0*  CL 107 107 107 105  CO2 20* 22 23 23   GLUCOSE 175* 109* 79 114*  BUN 24* 24* 12 11  CREATININE 0.90 0.87 0.75 0.73  CALCIUM 8.1* 8.1* 8.5* 8.5*   GFR: Estimated Creatinine Clearance: 47.3 mL/min (by C-G formula based on SCr of 0.73 mg/dL).   Liver Function Tests: Recent Labs  Lab 01/25/20 0100 01/25/20 0731  AST 494* 334*  ALT 344* 330*  ALKPHOS 187* 187*  BILITOT 1.6* 1.6*  PROT 5.7* 6.2*  ALBUMIN 3.0* 3.1*   Coagulation Profile: Recent Labs  Lab 01/25/20 0100  INR 1.4*   CBG: Recent Labs  Lab 01/26/20 1612 01/26/20 1936 01/26/20 2356 01/27/20 0348 01/27/20 0734  GLUCAP 71 114* 154* 111* 122*   Sepsis Labs: Recent Labs  Lab 01/25/20 0100 01/25/20 0340 01/25/20 0731  PROCALCITON  --   --  1.33  LATICACIDVEN 2.0* 1.3 1.2    Recent Results (from the past 240 hour(s))  Blood Culture (routine x 2)     Status: Abnormal (Preliminary result)   Collection Time: 01/25/20  1:05 AM  Specimen: BLOOD LEFT FOREARM  Result Value Ref Range Status   Specimen Description BLOOD  LEFT FOREARM  Final   Special Requests   Final    BOTTLES DRAWN AEROBIC AND ANAEROBIC Blood Culture adequate volume   Culture  Setup Time   Final    GRAM NEGATIVE RODS AEROBIC BOTTLE ONLY CRITICAL RESULT CALLED TO, READ BACK BY AND VERIFIED WITH: Cindra Presume PharmD 16:25 01/25/20 ([email or web address])    Culture (A)  Final    KLEBSIELLA PNEUMONIAE REPEATING SUSCEPTIBILITIES Performed at [facility], [address] [place], [place] [address]    Report Status PENDING  Incomplete  Blood Culture (routine x 2)     Status: Abnormal (Preliminary result)   Collection Time: 01/25/20  1:05 AM   Specimen: BLOOD  Result Value Ref Range Status   Specimen Description BLOOD LEFT ANTECUBITAL  Final   Special Requests   Final    BOTTLES DRAWN AEROBIC AND ANAEROBIC Blood Culture adequate volume   Culture  Setup Time   Final    GRAM NEGATIVE RODS AEROBIC BOTTLE ONLY CRITICAL VALUE NOTED.  VALUE IS CONSISTENT WITH PREVIOUSLY REPORTED AND CALLED VALUE.    Culture (A)  Final    KLEBSIELLA PNEUMONIAE SUSCEPTIBILITIES TO FOLLOW Performed at [facility], [address], [place], [place] [address]    Report Status PENDING  Incomplete  Blood Culture ID Panel (Reflexed)     Status: Abnormal   Collection Time: 01/25/20  1:05 AM  Result Value Ref Range Status   Enterococcus species NOT DETECTED NOT DETECTED Final   Listeria monocytogenes NOT DETECTED NOT DETECTED Final   Staphylococcus species NOT DETECTED NOT DETECTED Final   Staphylococcus aureus (BCID) NOT DETECTED NOT DETECTED Final   Streptococcus species NOT DETECTED NOT DETECTED Final   Streptococcus agalactiae NOT DETECTED NOT DETECTED Final   Streptococcus pneumoniae NOT DETECTED NOT DETECTED Final   Streptococcus pyogenes NOT DETECTED NOT DETECTED Final   Acinetobacter baumannii NOT DETECTED NOT DETECTED Final   Enterobacteriaceae species DETECTED (A) NOT DETECTED Final    Comment: Enterobacteriaceae represent a large family of gram-negative  bacteria, not a single organism. CRITICAL RESULT CALLED TO, READ BACK BY AND VERIFIED WITH: Cindra Presume PharmD 16:25 01/25/20 ([email or web address])    Enterobacter cloacae complex NOT DETECTED NOT DETECTED Final   Escherichia coli NOT DETECTED NOT DETECTED Final   Klebsiella oxytoca NOT DETECTED NOT DETECTED Final   Klebsiella pneumoniae DETECTED (A) NOT DETECTED Final    Comment: CRITICAL RESULT CALLED TO, READ BACK BY AND VERIFIED WITH: Cindra Presume PharmD 16:25 01/25/20 ([email or web address])    Proteus species NOT DETECTED NOT DETECTED Final   Serratia marcescens NOT DETECTED NOT DETECTED Final   Carbapenem resistance NOT DETECTED NOT DETECTED Final   Haemophilus influenzae NOT DETECTED NOT DETECTED Final   Neisseria meningitidis NOT DETECTED NOT DETECTED Final   Pseudomonas aeruginosa NOT DETECTED NOT DETECTED Final   Candida albicans NOT DETECTED NOT DETECTED Final   Candida glabrata NOT DETECTED NOT DETECTED Final   Candida krusei NOT DETECTED NOT DETECTED Final   Candida parapsilosis NOT DETECTED NOT DETECTED Final   Candida tropicalis NOT DETECTED NOT DETECTED Final    Comment: Performed at [facility], [address] [place], [place] [address]  SARS CORONAVIRUS 2 (TAT 6-24 HRS) Nasopharyngeal Nasopharyngeal Swab     Status: None   Collection Time: 01/25/20  4:00 AM   Specimen: Nasopharyngeal Swab  Result Value Ref Range Status   SARS Coronavirus 2  NEGATIVE NEGATIVE Final    Comment: (NOTE) SARS-CoV-2 target nucleic acids are NOT DETECTED. The SARS-CoV-2 RNA is generally detectable in upper and lower respiratory specimens during the acute phase of infection. Negative results do not preclude SARS-CoV-2 infection, do not rule out co-infections with other pathogens, and should not be used as the sole basis for treatment or other patient management decisions. Negative results must be combined with clinical observations, patient history, and epidemiological information. The expected result is  Negative. Fact Sheet for Patients: HairSlick.no Fact Sheet for Healthcare Providers: quierodirigir.com This test is not yet approved or cleared by the [place] FDA and  has been authorized for detection and/or diagnosis of SARS-CoV-2 by FDA under an Emergency Use Authorization (EUA). This EUA will remain  in effect (meaning this test can be used) for the duration of the COVID-19 declaration under Section 56 4(b)(1) of the Act, 21 U.S.C. section 360bbb-3(b)(1), unless the authorization is terminated or revoked sooner. Performed at [facility], [address], [place], [place] [address]   Urine culture     Status: Abnormal (Preliminary result)   Collection Time: 01/25/20  6:46 AM   Specimen: In/Out Cath Urine  Result Value Ref Range Status   Specimen Description IN/OUT CATH URINE  Final   Special Requests NONE  Final   Culture (A)  Final    100 COLONIES/mL STAPHYLOCOCCUS SIMULANS 300 COLONIES/mL ENTEROCOCCUS FAECALIS SUSCEPTIBILITIES TO FOLLOW Performed at [facility], [address] [place], [place] [address]    Report Status PENDING  Incomplete      Radiology Studies: NM Hepatobiliary Liver Func  Result Date: 01/25/2020 CLINICAL DATA:  Prior cholecystostomy tube. Concern for acute cholecystitis. EXAM: NUCLEAR MEDICINE HEPATOBILIARY IMAGING TECHNIQUE: Sequential images of the abdomen were obtained out to 60 minutes following intravenous administration of radiopharmaceutical. RADIOPHARMACEUTICALS:  5.25 mCi Tc-54m  Choletec IV COMPARISON:  CT 04/23/2020, 12/30/2019 FINDINGS: Prompt clearance from the blood pool and homogeneous uptake within liver. Counts are present in the small bowel by 45 minutes. There is activity in the porta hepatis in the expected location of the cystic duct however the gallbladder fails to fill. Imaging was performed up to 2 hours and the gallbladder failed to fill. Patient could ot  receive morphine due somnolence and shallow respirations. IMPRESSION: Gallbladder failed to fill over 2 hour interval concerning for cystic duct obstruction. Cystic duct appears to partially fill but the gallbladder does not fill. Patent common bile duct. These results will be called to the ordering clinician or representative by the Radiologist Assistant, and communication documented in the PACS or zVision Dashboard. Electronically Signed   By: Genevive Bi M.D.   On: 01/25/2020 15:29     Scheduled Meds: .  morphine injection  2.5 mg Intravenous Once  . pantoprazole (PROTONIX) IV  40 mg Intravenous Q24H  . potassium chloride  30 mEq Oral TID  . Rotigotine  3 mg Transdermal QHS   Continuous Infusions: . sodium chloride 10 mL/hr at 01/25/20 1430  . cefTRIAXone (ROCEPHIN)  IV 2 g (01/26/20 2111)     LOS: 2 days    Time spent: 25 minutes   Sharlene Dory, DO [facility] 01/27/2020, 10:56 AM   Available via Epic secure chat 7am-7pm After these hours, please refer to coverage provider listed on [email or web address]

## 2020-01-27 NOTE — Progress Notes (Signed)
2000 CBG is 305 with no insulin ordered. Patient denies any increased thirst/urination or blurry vision. On-call provider paged. Patient resting in bed.

## 2020-01-28 DIAGNOSIS — R7881 Bacteremia: Secondary | ICD-10-CM | POA: Diagnosis present

## 2020-01-28 LAB — CBC
HCT: 29 % — ABNORMAL LOW (ref 36.0–46.0)
Hemoglobin: 9.4 g/dL — ABNORMAL LOW (ref 12.0–15.0)
MCH: 29.7 pg (ref 26.0–34.0)
MCHC: 32.4 g/dL (ref 30.0–36.0)
MCV: 91.5 fL (ref 80.0–100.0)
Platelets: 195 10*3/uL (ref 150–400)
RBC: 3.17 MIL/uL — ABNORMAL LOW (ref 3.87–5.11)
RDW: 14.1 % (ref 11.5–15.5)
WBC: 6.9 10*3/uL (ref 4.0–10.5)
nRBC: 0 % (ref 0.0–0.2)

## 2020-01-28 LAB — GLUCOSE, CAPILLARY
Glucose-Capillary: 101 mg/dL — ABNORMAL HIGH (ref 70–99)
Glucose-Capillary: 106 mg/dL — ABNORMAL HIGH (ref 70–99)
Glucose-Capillary: 115 mg/dL — ABNORMAL HIGH (ref 70–99)
Glucose-Capillary: 121 mg/dL — ABNORMAL HIGH (ref 70–99)
Glucose-Capillary: 133 mg/dL — ABNORMAL HIGH (ref 70–99)
Glucose-Capillary: 150 mg/dL — ABNORMAL HIGH (ref 70–99)
Glucose-Capillary: 157 mg/dL — ABNORMAL HIGH (ref 70–99)

## 2020-01-28 LAB — CULTURE, BLOOD (ROUTINE X 2): Special Requests: ADEQUATE

## 2020-01-28 LAB — COMPREHENSIVE METABOLIC PANEL
ALT: 67 U/L — ABNORMAL HIGH (ref 0–44)
AST: 26 U/L (ref 15–41)
Albumin: 2.5 g/dL — ABNORMAL LOW (ref 3.5–5.0)
Alkaline Phosphatase: 127 U/L — ABNORMAL HIGH (ref 38–126)
Anion gap: 11 (ref 5–15)
BUN: 10 mg/dL (ref 8–23)
CO2: 22 mmol/L (ref 22–32)
Calcium: 8.4 mg/dL — ABNORMAL LOW (ref 8.9–10.3)
Chloride: 108 mmol/L (ref 98–111)
Creatinine, Ser: 0.6 mg/dL (ref 0.44–1.00)
GFR calc Af Amer: >60 mL/min (ref >60)
GFR calc non Af Amer: >60 mL/min (ref >60)
Glucose, Bld: 113 mg/dL — ABNORMAL HIGH (ref 70–99)
Potassium: 3.9 mmol/L (ref 3.5–5.1)
Sodium: 141 mmol/L (ref 135–145)
Total Bilirubin: 0.6 mg/dL (ref 0.3–1.2)
Total Protein: 5.7 g/dL — ABNORMAL LOW (ref 6.5–8.1)

## 2020-01-28 LAB — URINE CULTURE: Culture: 100 — AB

## 2020-01-28 LAB — MRSA PCR SCREENING: MRSA by PCR: NEGATIVE

## 2020-01-28 MED ORDER — ATORVASTATIN CALCIUM 40 MG PO TABS
40.0000 mg | ORAL_TABLET | Freq: Every day | ORAL | Status: DC
  Administered 2020-01-28 – 2020-01-29 (×2): 40 mg via ORAL
  Filled 2020-01-28 (×2): qty 1

## 2020-01-28 MED ORDER — PANTOPRAZOLE SODIUM 40 MG PO TBEC
40.0000 mg | DELAYED_RELEASE_TABLET | Freq: Every day | ORAL | Status: DC
  Administered 2020-01-29: 40 mg via ORAL
  Filled 2020-01-28: qty 1

## 2020-01-28 MED ORDER — APIXABAN 5 MG PO TABS
5.0000 mg | ORAL_TABLET | Freq: Two times a day (BID) | ORAL | Status: DC
  Administered 2020-01-28 – 2020-01-29 (×3): 5 mg via ORAL
  Filled 2020-01-28 (×3): qty 1

## 2020-01-28 MED ORDER — POLYETHYLENE GLYCOL 3350 17 G PO PACK: 17.0000 g | PACK | Freq: Two times a day (BID) | ORAL | Status: DC | PRN

## 2020-01-28 MED ORDER — CEFAZOLIN SODIUM-DEXTROSE 2-4 GM/100ML-% IV SOLN
2.0000 g | Freq: Three times a day (TID) | INTRAVENOUS | Status: DC
  Administered 2020-01-28 – 2020-01-29 (×3): 2 g via INTRAVENOUS
  Filled 2020-01-28 (×5): qty 100

## 2020-01-28 MED ORDER — AMLODIPINE BESYLATE 5 MG PO TABS
5.0000 mg | ORAL_TABLET | Freq: Every day | ORAL | Status: DC
  Administered 2020-01-28 – 2020-01-29 (×2): 5 mg via ORAL
  Filled 2020-01-28 (×2): qty 1

## 2020-01-28 MED ORDER — IRBESARTAN 300 MG PO TABS
300.0000 mg | ORAL_TABLET | Freq: Every day | ORAL | Status: DC
  Administered 2020-01-28 – 2020-01-29 (×2): 300 mg via ORAL
  Filled 2020-01-28 (×2): qty 1

## 2020-01-28 MED ORDER — CLOPIDOGREL BISULFATE 75 MG PO TABS
75.0000 mg | ORAL_TABLET | Freq: Every day | ORAL | Status: DC
  Administered 2020-01-28 – 2020-01-29 (×2): 75 mg via ORAL
  Filled 2020-01-28 (×2): qty 1

## 2020-01-28 MED ORDER — OXYCODONE HCL 5 MG PO TABS
5.0000 mg | ORAL_TABLET | Freq: Four times a day (QID) | ORAL | Status: DC | PRN
  Administered 2020-01-29: 5 mg via ORAL
  Filled 2020-01-28: qty 1

## 2020-01-28 NOTE — Progress Notes (Signed)
Referring Physician(s): Dr. Andrey Campanile  Supervising Physician: Gilmer Mor  Patient Status:  [facility] - In-pt  Chief Complaint: Follow up percutaneous cholecystostomy replacement 01/28/20 with Dr. Loreta Ave  Subjective:  Patient laying in bed watching TV, she denies any complaints today except that she is ready to go home once PT is setup which she is hoping will be this weekend.   Allergies: Codeine  Medications: Prior to Admission medications   Medication Sig Start Date End Date Taking? Authorizing Provider  acetaminophen (TYLENOL) 325 MG tablet Take 2 tablets (650 mg total) by mouth every 6 (six) hours as needed for mild pain, moderate pain or fever (or Fever >/= 101). 01/03/20  Yes Hongalgi, Maximino Greenland, MD  Albuterol Sulfate (PROAIR RESPICLICK) 108 (90 Base) MCG/ACT AEPB Inhale 2 puffs into the lungs every 6 (six) hours as needed (shortness of breath from COPD). 12/29/18  Yes Shelva Majestic, MD  apixaban (ELIQUIS) 5 MG TABS tablet Take 1 tablet (5 mg total) by mouth 2 (two) times daily. Start 01/04/2020. 01/04/20  Yes Elease Etienne, MD  atorvastatin (LIPITOR) 40 MG tablet Take 1 tablet (40 mg total) by mouth daily. 10/07/19  Yes Shelva Majestic, MD  clopidogrel (PLAVIX) 75 MG tablet Take 1 tablet (75 mg total) by mouth daily with breakfast. MUST KEEP APPOINTMENT 07/20/19 WITH DR Allyson Sabal FOR FUTURE REFILLS 06/11/19  Yes Runell Gess, MD  metFORMIN (GLUCOPHAGE) 500 MG tablet TAKE 1 (1) TABLET BY MOUTH DAILY Patient taking differently: Take 500 mg by mouth daily with breakfast.  10/05/19  Yes Shelva Majestic, MD  Multiple Vitamins-Minerals (PRESERVISION AREDS 2 PO) Take 1 tablet by mouth 2 (two) times daily.   Yes [provider]  Olmesartan-amLODIPine-HCTZ (TRIBENZOR) 40-10-12.5 MG TABS Take 1 tablet by mouth every morning. Patient taking differently: Take 1 tablet by mouth daily.  10/05/19  Yes Shelva Majestic, MD  polyethylene glycol (MIRALAX / GLYCOLAX) 17 g packet Take 17 g by  mouth 2 (two) times daily. Reported on 03/14/2016 01/03/20  Yes Hongalgi, Maximino Greenland, MD  Rotigotine 3 MG/24HR PT24 Place 3 mg onto the skin at bedtime. 03/31/19  Yes Shelva Majestic, MD  sodium chloride flush (NS) 0.9 % SOLN 5 mLs by Intracatheter route every 8 (eight) hours. 01/03/20   Rayburn, Alphonsus Sias, PA-C     Vital Signs: BP 139/67 (BP Location: Left Arm)   Pulse 82   Temp 98.6 F (37 C) (Oral)   Resp (!) 23   Ht 5\' 6"  (1.676 m)   Wt 152 lb 5.4 oz (69.1 kg)   LMP  (LMP Unknown)   SpO2 98%   BMI 24.59 kg/m   Physical Exam Vitals and nursing note reviewed.  Constitutional:      General: She is not in acute distress. HENT:     Head: Normocephalic.  Cardiovascular:     Rate and Rhythm: Normal rate.  Pulmonary:     Effort: Pulmonary effort is normal.  Abdominal:     General: There is no distension.     Palpations: Abdomen is soft.     Tenderness: There is no abdominal tenderness.     Comments: (+) perc chole to gravity with ~200 cc clear bilious output - insertion site clean, dry, dressed appropriately. No active bleeding or drainage. Suture and stat lock in tact.  Skin:    General: Skin is warm and dry.  Neurological:     Mental Status: She is alert. Mental status is at baseline.  Imaging: NM Hepatobiliary Liver Func  Result Date: 01/25/2020 CLINICAL DATA:  Prior cholecystostomy tube. Concern for acute cholecystitis. EXAM: NUCLEAR MEDICINE HEPATOBILIARY IMAGING TECHNIQUE: Sequential images of the abdomen were obtained out to 60 minutes following intravenous administration of radiopharmaceutical. RADIOPHARMACEUTICALS:  5.25 mCi Tc-11m  Choletec IV COMPARISON:  CT 04/23/2020, 12/30/2019 FINDINGS: Prompt clearance from the blood pool and homogeneous uptake within liver. Counts are present in the small bowel by 45 minutes. There is activity in the porta hepatis in the expected location of the cystic duct however the gallbladder fails to fill. Imaging was performed up to 2 hours  and the gallbladder failed to fill. Patient could ot receive morphine due somnolence and shallow respirations. IMPRESSION: Gallbladder failed to fill over 2 hour interval concerning for cystic duct obstruction. Cystic duct appears to partially fill but the gallbladder does not fill. Patent common bile duct. These results will be called to the ordering clinician or representative by the Radiologist Assistant, and communication documented in the PACS or zVision Dashboard. Electronically Signed   By: Genevive Bi M.D.   On: 01/25/2020 15:29   CT ABDOMEN PELVIS W CONTRAST  Result Date: 01/25/2020 CLINICAL DATA:  Fever and abdominal pain, recent percutaneous cholecystostomy EXAM: CT ABDOMEN AND PELVIS WITH CONTRAST TECHNIQUE: Multidetector CT imaging of the abdomen and pelvis was performed using the standard protocol following bolus administration of intravenous contrast. CONTRAST:  OMNIPAQUE IOHEXOL 300 MG/ML  SOLN COMPARISON:  12/29/2019 FINDINGS: Lower chest: Hypoventilatory changes are seen at the lung bases. Hepatobiliary: There is mild residual gallbladder wall thickening, though markedly improved since prior CT. Gallbladder is only minimally distended. The liver is unremarkable. Pancreas: Unremarkable. No pancreatic ductal dilatation or surrounding inflammatory changes. Spleen: Normal in size without focal abnormality. Adrenals/Urinary Tract: The adrenals, kidneys, and bladder are stable. No acute findings. Stomach/Bowel: No bowel obstruction or ileus. Moderate retained stool within the distal colon. Vascular/Lymphatic: Stable mild dilatation of the infrarenal abdominal aorta measuring 2.5 cm. Extensive aortic atherosclerosis. No pathologic adenopathy. Reproductive: Status post hysterectomy. No adnexal masses. Other: No abdominal wall hernia or abnormality. No abdominopelvic ascites. Musculoskeletal: No acute or destructive bony lesions. Reconstructed images demonstrate no additional findings.  IMPRESSION: 1. Residual gallbladder wall thickening, though markedly improved since prior study. Persistent underlying cholecystitis cannot be excluded. 2. Moderate fecal retention. 3. Otherwise stable exam. Electronically Signed   By: Sharlet Salina M.D.   On: 01/25/2020 02:55   IR Perc Cholecystostomy  Result Date: 01/27/2020 INDICATION: 86 year old female with a history of acute cholecystitis superimposed on chronic cholecystitis. This is the patient's third percutaneous cholecystostomy. Recurrence occurred after the first intentional removal, and the second accidental removal EXAM: IMAGE GUIDED PERCUTANEOUS CHOLECYSTOSTOMY MEDICATIONS: None ANESTHESIA/SEDATION: Moderate (conscious) sedation was employed during this procedure. A total of Versed 1.0 mg and Fentanyl 50 mcg was administered intravenously. Moderate Sedation Time: 16 minutes. The patient's level of consciousness and vital signs were monitored continuously by radiology nursing throughout the procedure under my direct supervision. FLUOROSCOPY TIME:  Fluoroscopy Time: 1 minutes 42 seconds (22 mGy). COMPLICATIONS: None PROCEDURE: Informed written consent was obtained from the patient and the patient's family after a thorough discussion of the procedural risks, benefits and alternatives. All questions were addressed. Maximal Sterile Barrier Technique was utilized including caps, mask, sterile gowns, sterile gloves, sterile drape, hand hygiene and skin antiseptic. A timeout was performed prior to the initiation of the procedure. Ultrasound survey of the right upper quadrant was performed for planning purposes. Once the patient is prepped  and draped in the usual sterile fashion, the skin and subcutaneous tissues overlying the gallbladder were generously infiltrated 1% lidocaine for local anesthesia. A coaxial needle was advanced under ultrasound guidance through the skin subcutaneous tissues and into the gallbladder lumen via trans fundal approach. With  removal of the stylet, spontaneous dark bile drainage occurred. Using modified Seldinger technique, a 10 French drain was placed into the gallbladder fossa, with aspiration of the sample for the lab. Contrast injection confirmed position of the tube within the gallbladder lumen. Drainage catheter was attached to gravity drain with a suture retention placed. Patient tolerated the procedure well and remained hemodynamically stable throughout. No complications were encountered and no significant blood loss encountered. IMPRESSION: Status post image guided percutaneous cholecystostomy. Signed, Yvone Neu. Reyne Dumas, RPVI [facility] Interventional Radiology Special[facility] Radiology Electronically Signed   By: Gilmer Mor D.O.   On: 01/27/2020 10:59   DG Chest Port 1 View  Result Date: 01/25/2020 CLINICAL DATA:  Chest pain for 1 day EXAM: PORTABLE CHEST 1 VIEW COMPARISON:  04/29/2016 FINDINGS: Single frontal view of the chest is obtained, with the patient rotated toward the right. Evaluation is limited by positioning and portable AP technique. Cardiac silhouette is grossly stable. Continued ectasia of the thoracic aorta. No airspace disease, effusion, or pneumothorax. Chronic right humeral infarct. IMPRESSION: 1. Stable exam allowing for differences in positioning and technique. No acute process. Electronically Signed   By: Sharlet Salina M.D.   On: 01/25/2020 01:22    Labs:  CBC: Recent Labs    01/25/20 0731 01/26/20 0644 01/27/20 0605 01/28/20 0618  WBC 12.0* 8.9 8.5 6.9  HGB 9.9* 9.1* 9.7* 9.4*  HCT 31.5* 28.4* 29.8* 29.0*  PLT 174 167 183 195    COAGS: Recent Labs    12/30/19 1210 01/25/20 0100  INR 1.4* 1.4*  APTT  --  28    BMP: Recent Labs    01/25/20 0731 01/26/20 0644 01/27/20 0605 01/28/20 0618  NA 140 141 140 141  K 3.5 3.0* 3.0* 3.9  CL 107 107 105 108  CO2 22 23 23 22   GLUCOSE 109* 79 114* 113*  BUN 24* 12 11 10   CALCIUM 8.1* 8.5* 8.5* 8.4*  CREATININE  0.87 0.75 0.73 0.60  GFRNONAA >60 >60 >60 >60  GFRAA >60 >60 >60 >60    LIVER FUNCTION TESTS: Recent Labs    01/03/20 0636 01/25/20 0100 01/25/20 0731 01/28/20 0618  BILITOT 0.8 1.6* 1.6* 0.6  AST 23 494* 334* 26  ALT 25 344* 330* 67*  ALKPHOS 61 187* 187* 127*  PROT 5.9* 5.7* 6.2* 5.7*  ALBUMIN 2.5* 3.0* 3.1* 2.5*    Assessment and Plan:  86 y/o F with acute cholecystitis who previously underwent percutaneous cholecystostomy placement 12/30/19 in IR with subsequent dislodgement of tube earlier this month and complete removal in IR 01/14/20 who [facility] to [facility] ED on 01/25/20 with recurrent acute cholecystitis. She underwent percutaneous cholecystostomy replacement 01/25/20 in IR and seen today for follow up.  Patient denies any complaints regarding the drain - is hopeful she can go home soon. Insertion site unremarkable, suture/stat lock in tact, output clear bilious. Per I/O 30 cc in last 24H however 200+ cc in gravity bag on my exam this afternoon. Abdominal pain resolved, tolerating diet, afebrile, WBC 6.9, hgb 9.4.  Continue TID flushes with 5 cc NS, record output Qshift, dressing changes QD or PRN if soiled, call IR if difficulty flushing or sudden decrease in output.  Percutaneous cholecystostomy will need to remain in place for at least 6 weeks unless she undergoes cholecystectomy prior to that time - it is likely based on her recurrent calculus cholecystitis and poor surgical candidacy that this tube will remain long term and will need to be exchanged every 6-8 weeks. I have placed an order for outpatient follow up should she be discharged this weekend.  IR will continue to follow - please call with questions or concerns.   Electronically Signed: Joaquim Nam, PA-C 01/28/2020, 3:44 PM   I spent a total of 15 Minutes at the the patient's bedside AND on the patient's hospital floor or unit, greater than 50% of which was counseling/coordinating care for percutaneous  cholecystostomy follow up.

## 2020-01-28 NOTE — Discharge Instructions (Signed)

## 2020-01-28 NOTE — Plan of Care (Signed)

## 2020-01-28 NOTE — Progress Notes (Signed)
Subjective: CC: Acute Cholecystitis  Doing well. No real abdominal pain except when palpated in the epigastrium and RUQ. She is tolerating her diet without n/v. Working with therapies who are recommending SNF vs CIR.  ROS: See above, otherwise other systems negative   Objective: Vital signs in last 24 hours: Temp:  [98 F (36.7 C)-99.2 F (37.3 C)] 98 F (36.7 C) (02/26 0735) Pulse Rate:  [73-86] 86 (02/26 0735) Resp:  [19-25] 25 (02/26 0735) BP: (140-173)/(71-94) 160/94 (02/26 0735) SpO2:  [96 %-100 %] 97 % (02/26 0735) Weight:  [69.1 kg] 69.1 kg (02/26 0500) Last BM Date: 01/26/20  Intake/Output from previous day: 02/25 0701 - 02/26 0700 In: 15  Out: 730 [Urine:700; Drains:30] Intake/Output this shift: Total I/O In: 150 [P.O.:150] Out: 350 [Urine:350]  PE: Gen: Alert, NAD, pleasant Pulm: Normal rate and effort  Abd: Soft, ND, minimaltenderness of the epigastrium and RUQ without r/r/g.+BS. IR Perc Chole tube in place with bilious output in bag  Ext: No LE edema Psych: A&Ox3  Skin: no rashes noted, warm and dry  Lab Results:  Recent Labs    01/27/20 0605 01/28/20 0618  WBC 8.5 6.9  HGB 9.7* 9.4*  HCT 29.8* 29.0*  PLT 183 195   BMET Recent Labs    01/27/20 0605 01/28/20 0618  NA 140 141  K 3.0* 3.9  CL 105 108  CO2 23 22  GLUCOSE 114* 113*  BUN 11 10  CREATININE 0.73 0.60  CALCIUM 8.5* 8.4*   PT/INR No results for input(s): LABPROT, INR in the last 72 hours. CMP     Component Value Date/Time   NA 141 01/28/2020 0618   K 3.9 01/28/2020 0618   CL 108 01/28/2020 0618   CO2 22 01/28/2020 0618   GLUCOSE 113 (H) 01/28/2020 0618   BUN 10 01/28/2020 0618   CREATININE 0.60 01/28/2020 0618   CREATININE 0.76 08/28/2015 1025   CALCIUM 8.4 (L) 01/28/2020 0618   PROT 5.7 (L) 01/28/2020 0618   PROT 6.9 05/25/2018 0955   ALBUMIN 2.5 (L) 01/28/2020 0618   ALBUMIN 4.2 05/25/2018 0955   AST 26 01/28/2020 0618   ALT 67 (H) 01/28/2020 0618   ALKPHOS 127 (H) 01/28/2020 0618   BILITOT 0.6 01/28/2020 0618   BILITOT 0.4 05/25/2018 0955   GFRNONAA >60 01/28/2020 0618   GFRAA >60 01/28/2020 0618   Lipase     Component Value Date/Time   LIPASE 16 12/28/2019 2330       Studies/Results: IR Perc Cholecystostomy  Result Date: 01/27/2020 INDICATION: 86 year old female with a history of acute cholecystitis superimposed on chronic cholecystitis. This is the patient's third percutaneous cholecystostomy. Recurrence occurred after the first intentional removal, and the second accidental removal EXAM: IMAGE GUIDED PERCUTANEOUS CHOLECYSTOSTOMY MEDICATIONS: None ANESTHESIA/SEDATION: Moderate (conscious) sedation was employed during this procedure. A total of Versed 1.0 mg and Fentanyl 50 mcg was administered intravenously. Moderate Sedation Time: 16 minutes. The patient's level of consciousness and vital signs were monitored continuously by radiology nursing throughout the procedure under my direct supervision. FLUOROSCOPY TIME:  Fluoroscopy Time: 1 minutes 42 seconds (22 mGy). COMPLICATIONS: None PROCEDURE: Informed written consent was obtained from the patient and the patient's family after a thorough discussion of the procedural risks, benefits and alternatives. All questions were addressed. Maximal Sterile Barrier Technique was utilized including caps, mask, sterile gowns, sterile gloves, sterile drape, hand hygiene and skin antiseptic. A timeout was performed prior to the initiation of the procedure. Ultrasound survey  of the right upper quadrant was performed for planning purposes. Once the patient is prepped and draped in the usual sterile fashion, the skin and subcutaneous tissues overlying the gallbladder were generously infiltrated 1% lidocaine for local anesthesia. A coaxial needle was advanced under ultrasound guidance through the skin subcutaneous tissues and into the gallbladder lumen via trans fundal approach. With removal of the stylet,  spontaneous dark bile drainage occurred. Using modified Seldinger technique, a 10 French drain was placed into the gallbladder fossa, with aspiration of the sample for the lab. Contrast injection confirmed position of the tube within the gallbladder lumen. Drainage catheter was attached to gravity drain with a suture retention placed. Patient tolerated the procedure well and remained hemodynamically stable throughout. No complications were encountered and no significant blood loss encountered. IMPRESSION: Status post image guided percutaneous cholecystostomy. Signed, Yvone Neu. Reyne Dumas, RPVI Vascular and Interventional Radiology Specialists [facility] Radiology Electronically Signed   By: Gilmer Mor D.O.   On: 01/27/2020 10:59    Anti-infectives: Anti-infectives (From admission, onward)   Start     Dose/Rate Route Frequency Ordered Stop   01/28/20 2200  ceFAZolin (ANCEF) IVPB 2g/100 mL premix     2 g 200 mL/hr over 30 Minutes Intravenous Every 8 hours 01/28/20 0953     01/25/20 2230  cefTRIAXone (ROCEPHIN) 2 g in sodium chloride 0.9 % 100 mL IVPB  Status:  Discontinued     2 g 200 mL/hr over 30 Minutes Intravenous Every 24 hours 01/25/20 1741 01/28/20 0953   01/25/20 1430  ceFAZolin (ANCEF) IVPB 2g/100 mL premix     2 g 200 mL/hr over 30 Minutes Intravenous To Radiology 01/25/20 1416 01/25/20 1639   01/25/20 1200  piperacillin-tazobactam (ZOSYN) IVPB 3.375 g  Status:  Discontinued     3.375 g 12.5 mL/hr over 240 Minutes Intravenous Every 8 hours 01/25/20 0434 01/25/20 1741   01/25/20 0115  ceFEPIme (MAXIPIME) 2 g in sodium chloride 0.9 % 100 mL IVPB     2 g 200 mL/hr over 30 Minutes Intravenous  Once 01/25/20 0100 01/25/20 0202   01/25/20 0115  metroNIDAZOLE (FLAGYL) IVPB 500 mg     500 mg 100 mL/hr over 60 Minutes Intravenous  Once 01/25/20 0100 01/25/20 0221   01/25/20 0115  vancomycin (VANCOCIN) IVPB 1000 mg/200 mL premix  Status:  Discontinued     1,000 mg 200 mL/hr over 60 Minutes  Intravenous  Once 01/25/20 0100 01/25/20 0104   01/25/20 0115  vancomycin (VANCOREADY) IVPB 1250 mg/250 mL     1,250 mg 166.7 mL/hr over 90 Minutes Intravenous  Once 01/25/20 0104 01/25/20 0338       Assessment/Plan GERD HTN HLD PVD on plavix  Hx A. Fib on Elqiuis COPD Pre-DM Hypothyroidism RLS  Klebsiella Bacteremia  AcuteCholecystitis -Admitted 1/27 for acute cholecystitis.Perc Chole Tube placed by IR 1/28.Discharged 2/1 to SNF.Perc Chole tube dislodged at SNF. - Readmitted 2/23 w/ acute cholecystitis - s/p IR Perc Chole Tube placement - 2/25 - Needs 14d abx total from a general surgery standpoint. I do not see a cx in [facility] from [employer]. Abx per TRH given bacteremia  - We will see again on Monday. Please call on the weekend with any questions or concerns.   FEN -Reg VTE -SCDs, Eliquis, Plavix  ID -Zosyn 2/23. Rocephin 2/23 - 2/26. Ancef 2/26 >> Follow-Up - [facility], IR   LOS: 3 days    Jacinto Halim , [facility] 01/28/2020, 9:59 AM Please see Amion  for pager number during day hours 7:00am-4:30pm

## 2020-01-28 NOTE — TOC Initial Note (Signed)
Transition of Care [facility]) - Initial/Assessment Note    Patient Details  Name: Cheryl Atkinson MRN: [id number] Date of Birth: May 07, 1931  Transition of Care [facility]) CM/SW Contact:    Vinie Sill, [place] Phone Number: 01/28/2020, 3:11 PM  Clinical Narrative:                  CSW visit with the patient at bedside. CSW introduced self and explained role. CSW discussed PT recommendation for ST rehab. Patient refused inpatient rehab and ST rehab at [facility]. Patient wants to be discharged home when medically stable. She expressed she has been to SNF before and does not want to return to another SNF. She requested to get PT at home. Patient states preference for  [facility] or [facility]. Patient states the only DME she needs is bedside commode. Patient states she has wheel chair ,walker and cane.  CSW spoke with patient's son, [name] by phone. He confirmed he lives in the home with the patient. He informed CSW he has arranged someone to be with the patient during the day and he will be there with her at night. He is agreeable with the patient's plan to discharge home.   Thurmond Butts, MSW, [place] Clinical Social Worker   Expected Discharge Plan: [place] Barriers to Discharge: Continued Medical Work up   Patient Goals and CMS Choice        Expected Discharge Plan and Services Expected Discharge Plan: [place] In-house Referral: Clinical Social Work Discharge Planning Services: CM Consult Post Acute Care Choice: Home Health, Resumption of Svcs/PTA Provider Living arrangements for the past 2 months: [place]: [facility] [place]        Prior Living Arrangements/Services Living arrangements for the past 2 months: Single Family Home Lives with:: Self, Adult Children Patient language and need for interpreter reviewed:: No Do you feel safe going back to the place where you live?: Yes       Need for Family Participation in Patient Care: Yes (Comment) Care giver support system in place?: Yes (comment) Current home services: DME, Home OT, Home PT Criminal Activity/Legal Involvement Pertinent to Current Situation/Hospitalization: No - Comment as needed  Activities of Daily Living      Permission Sought/Granted Permission sought to share information with : Family Supports, Customer service manager, Case Optician, dispensing granted to share information with : Yes, Verbal Permission Granted  Share Information with NAME: Debera Sterba  Permission granted to share info w AGENCY: [place] granted to share info w Relationship: son  Permission granted to share info w Contact Information: [phone]  Emotional Assessment Appearance:: Appears stated age Attitude/Demeanor/Rapport: Engaged Affect (typically observed): Appropriate, Pleasant Orientation: : Oriented to Self, Oriented to Place, Oriented to  Time, Oriented to Situation Alcohol / Substance Use: Not Applicable Psych Involvement: No (comment)  Admission diagnosis:  Elevated LFTs [R79.89] Sepsis ([place]) [A41.9] Thickening of wall of gallbladder [K82.8] Patient Active Problem List   Diagnosis Date Noted  . Bacteremia 01/28/2020  . Sepsis ([place]) 01/25/2020  . Acute cholangitis 01/25/2020  . Atrial fibrillation ([place]) 01/25/2020  . Acute cholecystitis 12/29/2019  . UTI (urinary tract infection) 12/29/2019  . Adrenal nodule ([place]) 12/29/2019  . Abdominal aortic ectasia ([place]) 12/29/2019  . Major depression 03/25/2018  .  Hypokalemia 12/21/2016  . Cat bite of right hand 12/21/2016  . BPPV (benign paroxysmal positional vertigo) 07/12/2016  . Memory loss 04/11/2016  . Mallet toe of right foot 10/20/2015  . Renal artery stenosis (HCC) 09/27/2015  . Hyperlipidemia 06/30/2015  . Claudication (HCC) 05/04/2015  . Atherosclerotic PVD with intermittent claudication (HCC) 04/26/2015  . Tinnitus  12/31/2014  . Former smoker 09/29/2014  . DNR (do not resuscitate) 09/29/2014  . Insulin resistance 07/19/2014  . Multinodular goiter 08/30/2013  . Spinal stenosis of lumbar region at multiple levels 09/29/2012  . HIP PAIN, BILATERAL 07/16/2010  . COPD (chronic obstructive pulmonary disease) (HCC) 09/20/2009  . CONSTIPATION, CHRONIC 09/20/2009  . Iron deficiency anemia 11/09/2007  . History of UTI 11/09/2007  . RESTLESS LEG SYNDROME 09/25/2007  . Essential hypertension 09/25/2007  . GERD 09/25/2007  . LOW BACK PAIN 09/25/2007  . Osteoporosis 09/25/2007   PCP:  Shelva Majestic, MD Pharmacy:   [facility] - [address] [place] - [address] [place] [address] Phone: [phone] Fax: [phone]  [id number] [place], [place] - [address] AT [facility] OF [address] & [address] [place] [address] Phone: [phone] Fax: [phone]  [facility] - [place], [place] - [address] [place] [address] Phone: [phone] Fax: [phone]     Social Determinants of Health (SDOH) Interventions    Readmission Risk Interventions No flowsheet data found.

## 2020-01-28 NOTE — Progress Notes (Signed)
PROGRESS NOTE    Cheryl Atkinson  XNA:[id number] DOB: July 24, 1931 DOA: 01/25/2020 PCP: Shelva Majestic, MD     Brief Narrative:  Patient is an 86 year old female with a history of atrial fibrillation, hypertension, and prediabetes who was recently admitted for acute cholecystitis and discharged to rehab on February 1. A cholecystectomy tube had been placed but had accidentally fallen out while at rehab. The night after she was discharged to rehab, she described weakness, shortness of breath, and also had a temperature of 100.5 F. She is brought to the emergency department. It was determined she needed to have the tube replaced. General surgery consulted.   New events last 24 hours / Subjective: Perc chole tube placed yesterday. Feels fine. Eating. No BM yesterday or today.  Uses MiraLAX at home, requesting that.  No fevers, nausea, vomiting, chest pain, or shortness of breath.  Assessment & Plan:   Principal Problem:   Sepsis (HCC)  Resolved    BC's showed Klebsiella  Monitor CBC  Active Problems:   Bacteremia  2/2 Klebsiella  Change Rocephin to Ancef based off of sensitivity    Essential hypertension  Add back amlodipine 5 mg/d  Avapro 300 mg daily  She is on Olmesartan-Amlodipine-HCTZ 40-10-12.5 mg at home    COPD (chronic obstructive pulmonary disease) (HCC)  Albuterol neb prn    PVD  Plavix 75 mg daily  Lipitor 40 mg daily (LFT's <2x elevated)    Hypokalemia  Resolved  Monitor BMP    Acute cholangitis  S/P tube placement  Monitor LFT's, much better  Appreciate Gen Surg- states they will see her Mon  Change IV morphine to PO oxy 5 mg q 6 hrs prn    Atrial fibrillation (HCC)  Restart Eliquis    Constipation  MiraLAX  DVT prophylaxis: Eliquis Code Status: Full Family Communication: Self Coming From: Home Disposition Plan: SNF versus inpatient rehab Barriers to Discharge: Approval; patient's reluctance to go to SNF again  Consultants:   General  surgery  Interventional radiology  Procedures:   Percutaneous cholecystotomy tube placement; 01/27/2020  Antimicrobials:  Anti-infectives (From admission, onward)   Start     Dose/Rate Route Frequency Ordered Stop   01/28/20 2200  ceFAZolin (ANCEF) IVPB 2g/100 mL premix     2 g 200 mL/hr over 30 Minutes Intravenous Every 8 hours 01/28/20 0953     01/25/20 2230  cefTRIAXone (ROCEPHIN) 2 g in sodium chloride 0.9 % 100 mL IVPB  Status:  Discontinued     2 g 200 mL/hr over 30 Minutes Intravenous Every 24 hours 01/25/20 1741 01/28/20 0953   01/25/20 1430  ceFAZolin (ANCEF) IVPB 2g/100 mL premix     2 g 200 mL/hr over 30 Minutes Intravenous To Radiology 01/25/20 1416 01/25/20 1639   01/25/20 1200  piperacillin-tazobactam (ZOSYN) IVPB 3.375 g  Status:  Discontinued     3.375 g 12.5 mL/hr over 240 Minutes Intravenous Every 8 hours 01/25/20 0434 01/25/20 1741   01/25/20 0115  ceFEPIme (MAXIPIME) 2 g in sodium chloride 0.9 % 100 mL IVPB     2 g 200 mL/hr over 30 Minutes Intravenous  Once 01/25/20 0100 01/25/20 0202   01/25/20 0115  metroNIDAZOLE (FLAGYL) IVPB 500 mg     500 mg 100 mL/hr over 60 Minutes Intravenous  Once 01/25/20 0100 01/25/20 0221   01/25/20 0115  vancomycin (VANCOCIN) IVPB 1000 mg/200 mL premix  Status:  Discontinued     1,000 mg 200 mL/hr over 60 Minutes Intravenous  Once  01/25/20 0100 01/25/20 0104   01/25/20 0115  vancomycin (VANCOREADY) IVPB 1250 mg/250 mL     1,250 mg 166.7 mL/hr over 90 Minutes Intravenous  Once 01/25/20 0104 01/25/20 0338        Objective: Vitals:   01/28/20 0336 01/28/20 0500 01/28/20 0735 01/28/20 1116  BP: (!) 154/71  (!) 160/94 139/67  Pulse: 84  86 82  Resp: (!) 23  (!) 25 (!) 23  Temp: 98.8 F (37.1 C)  98 F (36.7 C) 98.6 F (37 C)  TempSrc: Oral  Oral Oral  SpO2: 96%  97% 98%  Weight:  69.1 kg    Height:        Intake/Output Summary (Last 24 hours) at 01/28/2020 1131 Last data filed at 01/28/2020 1116 Gross per 24 hour    Intake 165 ml  Output 480 ml  Net -315 ml   Filed Weights   01/25/20 0400 01/28/20 0500  Weight: 65.9 kg 69.1 kg    Examination:  General exam: Appears calm and comfortable  Respiratory system: Clear to auscultation. Respiratory effort normal. No respiratory distress. No conversational dyspnea.  Cardiovascular system: S1 & S2 heard, RRR. No murmurs. No pedal edema. Gastrointestinal system: Abdomen is mildly distended, soft and diffusely tender to mild palpation. Normal bowel sounds heard. Central nervous system: Alert and oriented. No focal neurological deficits. Speech clear.  Extremities: Symmetric in appearance  Skin: No rashes, lesions or ulcers on exposed skin  Psychiatry: Judgement and insight appear normal. Mood & affect appropriate.   Data Reviewed: I have personally reviewed following labs and imaging studies  CBC: Recent Labs  Lab 01/25/20 0100 01/25/20 0731 01/26/20 0644 01/27/20 0605 01/28/20 0618  WBC 10.2 12.0* 8.9 8.5 6.9  NEUTROABS 8.8* 9.9*  --   --   --   HGB 8.9* 9.9* 9.1* 9.7* 9.4*  HCT 28.9* 31.5* 28.4* 29.8* 29.0*  MCV 94.8 94.6 92.2 90.9 91.5  PLT 173 174 167 183 195   Basic Metabolic Panel: Recent Labs  Lab 01/25/20 0100 01/25/20 0731 01/26/20 0644 01/27/20 0605 01/28/20 0618  NA 140 140 141 140 141  K 2.9* 3.5 3.0* 3.0* 3.9  CL 107 107 107 105 108  CO2 20* 22 23 23 22   GLUCOSE 175* 109* 79 114* 113*  BUN 24* 24* 12 11 10   CREATININE 0.90 0.87 0.75 0.73 0.60  CALCIUM 8.1* 8.1* 8.5* 8.5* 8.4*   GFR: Estimated Creatinine Clearance: 47.3 mL/min (by C-G formula based on SCr of 0.6 mg/dL). Liver Function Tests: Recent Labs  Lab 01/25/20 0100 01/25/20 0731 01/28/20 0618  AST 494* 334* 26  ALT 344* 330* 67*  ALKPHOS 187* 187* 127*  BILITOT 1.6* 1.6* 0.6  PROT 5.7* 6.2* 5.7*  ALBUMIN 3.0* 3.1* 2.5*   Coagulation Profile: Recent Labs  Lab 01/25/20 0100  INR 1.4*   CBG: Recent Labs  Lab 01/27/20 2002 01/28/20 0024  01/28/20 0424 01/28/20 0734 01/28/20 1115  GLUCAP 305* 150* 115* 101* 157*   Sepsis Labs: Recent Labs  Lab 01/25/20 0100 01/25/20 0340 01/25/20 0731  PROCALCITON  --   --  1.33  LATICACIDVEN 2.0* 1.3 1.2    Recent Results (from the past 240 hour(s))  Blood Culture (routine x 2)     Status: Abnormal   Collection Time: 01/25/20  1:05 AM   Specimen: BLOOD LEFT FOREARM  Result Value Ref Range Status   Specimen Description BLOOD LEFT FOREARM  Final   Special Requests   Final    BOTTLES  DRAWN AEROBIC AND ANAEROBIC Blood Culture adequate volume   Culture  Setup Time   Final    GRAM NEGATIVE RODS AEROBIC BOTTLE ONLY CRITICAL RESULT CALLED TO, READ BACK BY AND VERIFIED WITH: Cindra Presume PharmD 16:25 01/25/20 ([email or web address]) Performed at [facility], [address], [place], [place] [address]    Culture KLEBSIELLA PNEUMONIAE (A)  Final   Report Status 01/28/2020 FINAL  Final   Organism ID, Bacteria KLEBSIELLA PNEUMONIAE  Final      Susceptibility   Klebsiella pneumoniae - MIC*    AMPICILLIN >=32 RESISTANT Resistant     CEFAZOLIN <=4 SENSITIVE Sensitive     CEFEPIME <=0.12 SENSITIVE Sensitive     CEFTAZIDIME <=1 SENSITIVE Sensitive     CEFTRIAXONE <=0.25 SENSITIVE Sensitive     CIPROFLOXACIN <=0.25 SENSITIVE Sensitive     GENTAMICIN <=1 SENSITIVE Sensitive     IMIPENEM <=0.25 SENSITIVE Sensitive     TRIMETH/SULFA <=20 SENSITIVE Sensitive     AMPICILLIN/SULBACTAM 4 SENSITIVE Sensitive     PIP/TAZO 8 SENSITIVE Sensitive     * KLEBSIELLA PNEUMONIAE  Blood Culture (routine x 2)     Status: Abnormal (Preliminary result)   Collection Time: 01/25/20  1:05 AM   Specimen: BLOOD  Result Value Ref Range Status   Specimen Description BLOOD LEFT ANTECUBITAL  Final   Special Requests   Final    BOTTLES DRAWN AEROBIC AND ANAEROBIC Blood Culture adequate volume   Culture  Setup Time   Final    GRAM NEGATIVE RODS AEROBIC BOTTLE ONLY CRITICAL VALUE NOTED.  VALUE IS CONSISTENT WITH PREVIOUSLY  REPORTED AND CALLED VALUE.    Culture (A)  Final    KLEBSIELLA PNEUMONIAE SUSCEPTIBILITIES PERFORMED ON PREVIOUS CULTURE WITHIN THE LAST 5 DAYS. Performed at [facility], [address] [place], [place] [address]    Report Status PENDING  Incomplete  Blood Culture ID Panel (Reflexed)     Status: Abnormal   Collection Time: 01/25/20  1:05 AM  Result Value Ref Range Status   Enterococcus species NOT DETECTED NOT DETECTED Final   Listeria monocytogenes NOT DETECTED NOT DETECTED Final   Staphylococcus species NOT DETECTED NOT DETECTED Final   Staphylococcus aureus (BCID) NOT DETECTED NOT DETECTED Final   Streptococcus species NOT DETECTED NOT DETECTED Final   Streptococcus agalactiae NOT DETECTED NOT DETECTED Final   Streptococcus pneumoniae NOT DETECTED NOT DETECTED Final   Streptococcus pyogenes NOT DETECTED NOT DETECTED Final   Acinetobacter baumannii NOT DETECTED NOT DETECTED Final   Enterobacteriaceae species DETECTED (A) NOT DETECTED Final    Comment: Enterobacteriaceae represent a large family of gram-negative bacteria, not a single organism. CRITICAL RESULT CALLED TO, READ BACK BY AND VERIFIED WITH: Cindra Presume PharmD 16:25 01/25/20 ([email or web address])    Enterobacter cloacae complex NOT DETECTED NOT DETECTED Final   Escherichia coli NOT DETECTED NOT DETECTED Final   Klebsiella oxytoca NOT DETECTED NOT DETECTED Final   Klebsiella pneumoniae DETECTED (A) NOT DETECTED Final    Comment: CRITICAL RESULT CALLED TO, READ BACK BY AND VERIFIED WITH: Cindra Presume PharmD 16:25 01/25/20 ([email or web address])    Proteus species NOT DETECTED NOT DETECTED Final   Serratia marcescens NOT DETECTED NOT DETECTED Final   Carbapenem resistance NOT DETECTED NOT DETECTED Final   Haemophilus influenzae NOT DETECTED NOT DETECTED Final   Neisseria meningitidis NOT DETECTED NOT DETECTED Final   Pseudomonas aeruginosa NOT DETECTED NOT DETECTED Final   Candida albicans NOT DETECTED NOT DETECTED Final   Candida glabrata NOT  DETECTED  NOT DETECTED Final   Candida krusei NOT DETECTED NOT DETECTED Final   Candida parapsilosis NOT DETECTED NOT DETECTED Final   Candida tropicalis NOT DETECTED NOT DETECTED Final    Comment: Performed at [facility], [address] [place], [place] [address]  SARS CORONAVIRUS 2 (TAT 6-24 HRS) Nasopharyngeal Nasopharyngeal Swab     Status: None   Collection Time: 01/25/20  4:00 AM   Specimen: Nasopharyngeal Swab  Result Value Ref Range Status   SARS Coronavirus 2 NEGATIVE NEGATIVE Final    Comment: (NOTE) SARS-CoV-2 target nucleic acids are NOT DETECTED. The SARS-CoV-2 RNA is generally detectable in upper and lower respiratory specimens during the acute phase of infection. Negative results do not preclude SARS-CoV-2 infection, do not rule out co-infections with other pathogens, and should not be used as the sole basis for treatment or other patient management decisions. Negative results must be combined with clinical observations, patient history, and epidemiological information. The expected result is Negative. Fact Sheet for Patients: SugarRoll.be Fact Sheet for Healthcare Providers: https://www.woods-mathews.com/ This test is not yet approved or cleared by the [place] FDA and  has been authorized for detection and/or diagnosis of SARS-CoV-2 by FDA under an Emergency Use Authorization (EUA). This EUA will remain  in effect (meaning this test can be used) for the duration of the COVID-19 declaration under Section 56 4(b)(1) of the Act, 21 U.S.C. section 360bbb-3(b)(1), unless the authorization is terminated or revoked sooner. Performed at [facility], [place] [address], [place], [place] [address]   Urine culture     Status: Abnormal   Collection Time: 01/25/20  6:46 AM   Specimen: In/Out Cath Urine  Result Value Ref Range Status   Specimen Description IN/OUT CATH URINE  Final   Special Requests   Final     NONE Performed at [facility], [place] [address], [place], [place] [address]    Culture (A)  Final    100 COLONIES/mL STAPHYLOCOCCUS SIMULANS 300 COLONIES/mL ENTEROCOCCUS FAECALIS    Report Status 01/28/2020 FINAL  Final   Organism ID, Bacteria STAPHYLOCOCCUS SIMULANS (A)  Final   Organism ID, Bacteria ENTEROCOCCUS FAECALIS (A)  Final      Susceptibility   Enterococcus faecalis - MIC*    AMPICILLIN <=2 SENSITIVE Sensitive     NITROFURANTOIN <=16 SENSITIVE Sensitive     VANCOMYCIN 2 SENSITIVE Sensitive     * 300 COLONIES/mL ENTEROCOCCUS FAECALIS   Staphylococcus simulans - MIC*    CIPROFLOXACIN >=8 RESISTANT Resistant     GENTAMICIN <=0.5 SENSITIVE Sensitive     NITROFURANTOIN <=16 SENSITIVE Sensitive     OXACILLIN >=4 RESISTANT Resistant     TETRACYCLINE <=1 SENSITIVE Sensitive     VANCOMYCIN 1 SENSITIVE Sensitive     TRIMETH/SULFA <=10 SENSITIVE Sensitive     CLINDAMYCIN 4 RESISTANT Resistant     RIFAMPIN 1 SENSITIVE Sensitive     Inducible Clindamycin NEGATIVE Sensitive     * 100 COLONIES/mL STAPHYLOCOCCUS SIMULANS  MRSA PCR Screening     Status: None   Collection Time: 01/27/20 11:30 PM   Specimen: Nasopharyngeal  Result Value Ref Range Status   MRSA by PCR NEGATIVE NEGATIVE Final    Comment:        The GeneXpert MRSA Assay (FDA approved for NASAL specimens only), is one component of a comprehensive MRSA colonization surveillance program. It is not intended to diagnose MRSA infection nor to guide or monitor treatment for MRSA infections. Performed at [facility], [address], [place], [place] [address]       Radiology Studies: IR Perc Cholecystostomy  Result Date: 01/27/2020 INDICATION: 86 year old female with a history of acute cholecystitis superimposed on chronic cholecystitis. This is the patient's third percutaneous cholecystostomy. Recurrence occurred after the first intentional removal, and the second accidental removal EXAM: IMAGE GUIDED  PERCUTANEOUS CHOLECYSTOSTOMY MEDICATIONS: None ANESTHESIA/SEDATION: Moderate (conscious) sedation was employed during this procedure. A total of Versed 1.0 mg and Fentanyl 50 mcg was administered intravenously. Moderate Sedation Time: 16 minutes. The patient's level of consciousness and vital signs were monitored continuously by radiology nursing throughout the procedure under my direct supervision. FLUOROSCOPY TIME:  Fluoroscopy Time: 1 minutes 42 seconds (22 mGy). COMPLICATIONS: None PROCEDURE: Informed written consent was obtained from the patient and the patient's family after a thorough discussion of the procedural risks, benefits and alternatives. All questions were addressed. Maximal Sterile Barrier Technique was utilized including caps, mask, sterile gowns, sterile gloves, sterile drape, hand hygiene and skin antiseptic. A timeout was performed prior to the initiation of the procedure. Ultrasound survey of the right upper quadrant was performed for planning purposes. Once the patient is prepped and draped in the usual sterile fashion, the skin and subcutaneous tissues overlying the gallbladder were generously infiltrated 1% lidocaine for local anesthesia. A coaxial needle was advanced under ultrasound guidance through the skin subcutaneous tissues and into the gallbladder lumen via trans fundal approach. With removal of the stylet, spontaneous dark bile drainage occurred. Using modified Seldinger technique, a 10 French drain was placed into the gallbladder fossa, with aspiration of the sample for the lab. Contrast injection confirmed position of the tube within the gallbladder lumen. Drainage catheter was attached to gravity drain with a suture retention placed. Patient tolerated the procedure well and remained hemodynamically stable throughout. No complications were encountered and no significant blood loss encountered. IMPRESSION: Status post image guided percutaneous cholecystostomy. Signed, Yvone Neu.  Reyne Dumas, RPVI [facility] Radiology Electronically Signed   By: Gilmer Mor D.O.   On: 01/27/2020 10:59     Scheduled Meds: . apixaban  5 mg Oral BID  . atorvastatin  40 mg Oral Daily  . clopidogrel  75 mg Oral Q breakfast  . pantoprazole (PROTONIX) IV  40 mg Intravenous Q24H  . Rotigotine  3 mg Transdermal QHS  . sodium chloride flush  5 mL Intracatheter Q8H   Continuous Infusions: . sodium chloride 10 mL/hr at 01/25/20 1430  .  ceFAZolin (ANCEF) IV       LOS: 3 days    Time spent: 30 minutes   Sharlene Dory, DO [facility] 01/28/2020, 11:31 AM   Available via Epic secure chat 7am-7pm After these hours, please refer to coverage provider listed on [email or web address]

## 2020-01-28 NOTE — Assessment & Plan Note (Signed)
Klebsiella

## 2020-01-28 NOTE — Progress Notes (Addendum)
Physical Therapy Treatment Patient Details Name: Cheryl Atkinson MRN: [id number] DOB: 01-Jan-1931 Today's Date: 01/28/2020    History of Present Illness Patient is an 86 year old female with a history of atrial fibrillation, hypertension, and prediabetes who was recently admitted for acute cholecystitis and discharged to rehab on February 1. A cholecystectomy tube had been placed but had accidentally fallen out while at rehab. The night after discharge from rehab, pt presented to ED with weakness, SOB, and increased temperature. S/p replacement of chole tube 01/27/2020.     PT Comments    Pt progressing slowly towards her physical therapy goals. Requiring two person assist to stand from low recliner (pt son reports she has a lift chair at home, but "it's on the fritz"). Ambulating 30 feet with a walker and mod assist for stability; close chair follow utilized. Pt demonstrates decreased gait speed and retropulsion which in addition to her history of falls, places her in high fall risk category. Would continue to benefit from post acute rehab due to deficits.     Follow Up Recommendations  CIR;Supervision/Assistance - 24 hour (pt refusing so will need HHPT/OT/aide; could also benefit from [facility])     Equipment Recommendations  3in1 (PT)    Recommendations for Other Services       Precautions / Restrictions Precautions Precautions: Fall;Other (comment) Precaution Comments: chole drain Restrictions Weight Bearing Restrictions: No    Mobility  Bed Mobility               General bed mobility comments: OOB in chair  Transfers Overall transfer level: Needs assistance Equipment used: Rolling walker (2 wheeled) Transfers: Sit to/from Stand Sit to Stand: Mod assist;+2 physical assistance         General transfer comment: ModA + 2 to stand from low recliner. Pt requiring assist with bed pad for reciprocal scooting to edge of seat.    Ambulation/Gait Ambulation/Gait assistance: Mod assist;+2 safety/equipment Gait Distance (Feet): 30 Feet Assistive device: Rolling walker (2 wheeled) Gait Pattern/deviations: Step-to pattern;Decreased stride length;Trunk flexed;Narrow base of support;Shuffle Gait velocity: decreased Gait velocity interpretation: <1.8 ft/sec, indicate of risk for recurrent falls General Gait Details: Pt requiring heavy modA initially due to retropulsion, somewhat improves with increased distance. Cues for sequencing, stepping initiation, upward gaze, truncal/hip extension. Close chair follow utilized.   Stairs             Wheelchair Mobility    Modified Rankin (Stroke Patients Only)       Balance Overall balance assessment: Needs assistance Sitting-balance support: Feet supported Sitting balance-Leahy Scale: Fair     Standing balance support: Bilateral upper extremity supported Standing balance-Leahy Scale: Poor                              Cognition Arousal/Alertness: Awake/alert Behavior During Therapy: WFL for tasks assessed/performed Overall Cognitive Status: Impaired/Different from baseline Area of Impairment: Safety/judgement                         Safety/Judgement: Decreased awareness of deficits;Decreased awareness of safety     General Comments: Pt continuously stating she wants to go home despite deficits, stating "it will be better when I get home."      Exercises      General Comments        Pertinent Vitals/Pain Pain Assessment: No/denies pain    Home Living  Prior Function            PT Goals (current goals can now be found in the care plan section) Acute Rehab PT Goals Patient Stated Goal: "return home." PT Goal Formulation: With patient Time For Goal Achievement: 02/10/20 Potential to Achieve Goals: Good Progress towards PT goals: Progressing toward goals    Frequency    Min  3X/week      PT Plan Current plan remains appropriate    Co-evaluation              AM-PAC PT "6 Clicks" Mobility   Outcome Measure  Help needed turning from your back to your side while in a flat bed without using bedrails?: A Little Help needed moving from lying on your back to sitting on the side of a flat bed without using bedrails?: A Lot Help needed moving to and from a bed to a chair (including a wheelchair)?: A Lot Help needed standing up from a chair using your arms (e.g., wheelchair or bedside chair)?: A Lot Help needed to walk in hospital room?: A Lot Help needed climbing 3-5 steps with a railing? : Total 6 Click Score: 12    End of Session Equipment Utilized During Treatment: Gait belt Activity Tolerance: Patient limited by fatigue Patient left: in chair;with call bell/phone within reach;with chair alarm set Nurse Communication: Mobility status PT Visit Diagnosis: Unsteadiness on feet (R26.81);Muscle weakness (generalized) (M62.81);Difficulty in walking, not elsewhere classified (R26.2)     Time: 2025-4270 PT Time Calculation (min) (ACUTE ONLY): 29 min  Charges:  $Gait Training: 8-22 mins $Therapeutic Activity: 8-22 mins                       Cheryl Atkinson, PT, DPT Acute Rehabilitation Services Pager [phone] Office [phone]    Cheryl Atkinson 01/28/2020, 4:13 PM

## 2020-01-29 DIAGNOSIS — R6520 Severe sepsis without septic shock: Secondary | ICD-10-CM

## 2020-01-29 DIAGNOSIS — A419 Sepsis, unspecified organism: Secondary | ICD-10-CM

## 2020-01-29 LAB — COMPREHENSIVE METABOLIC PANEL
ALT: 53 U/L — ABNORMAL HIGH (ref 0–44)
AST: 24 U/L (ref 15–41)
Albumin: 2.6 g/dL — ABNORMAL LOW (ref 3.5–5.0)
Alkaline Phosphatase: 115 U/L (ref 38–126)
Anion gap: 10 (ref 5–15)
BUN: 8 mg/dL (ref 8–23)
CO2: 24 mmol/L (ref 22–32)
Calcium: 8.5 mg/dL — ABNORMAL LOW (ref 8.9–10.3)
Chloride: 107 mmol/L (ref 98–111)
Creatinine, Ser: 0.69 mg/dL (ref 0.44–1.00)
GFR calc Af Amer: >60 mL/min (ref >60)
GFR calc non Af Amer: >60 mL/min (ref >60)
Glucose, Bld: 130 mg/dL — ABNORMAL HIGH (ref 70–99)
Potassium: 3.9 mmol/L (ref 3.5–5.1)
Sodium: 141 mmol/L (ref 135–145)
Total Bilirubin: 0.8 mg/dL (ref 0.3–1.2)
Total Protein: 5.8 g/dL — ABNORMAL LOW (ref 6.5–8.1)

## 2020-01-29 LAB — GLUCOSE, CAPILLARY
Glucose-Capillary: 107 mg/dL — ABNORMAL HIGH (ref 70–99)
Glucose-Capillary: 103 mg/dL — ABNORMAL HIGH (ref 70–99)
Glucose-Capillary: 120 mg/dL — ABNORMAL HIGH (ref 70–99)
Glucose-Capillary: 120 mg/dL — ABNORMAL HIGH (ref 70–99)

## 2020-01-29 LAB — CBC
HCT: 29.5 % — ABNORMAL LOW (ref 36.0–46.0)
Hemoglobin: 9.5 g/dL — ABNORMAL LOW (ref 12.0–15.0)
MCH: 29.6 pg (ref 26.0–34.0)
MCHC: 32.2 g/dL (ref 30.0–36.0)
MCV: 91.9 fL (ref 80.0–100.0)
Platelets: 195 10*3/uL (ref 150–400)
RBC: 3.21 MIL/uL — ABNORMAL LOW (ref 3.87–5.11)
RDW: 14.1 % (ref 11.5–15.5)
WBC: 9.2 10*3/uL (ref 4.0–10.5)
nRBC: 0 % (ref 0.0–0.2)

## 2020-01-29 LAB — CULTURE, BLOOD (ROUTINE X 2): Special Requests: ADEQUATE

## 2020-01-29 MED ORDER — AMOXICILLIN 500 MG PO CAPS: 500.0000 mg | ORAL_CAPSULE | Freq: Three times a day (TID) | ORAL | 0 refills | Status: AC

## 2020-01-29 MED ORDER — AMLODIPINE BESYLATE 5 MG PO TABS: 5.0000 mg | ORAL_TABLET | Freq: Every day | ORAL | 0 refills | Status: DC

## 2020-01-29 MED ORDER — IRBESARTAN 300 MG PO TABS: 300.0000 mg | ORAL_TABLET | Freq: Every day | ORAL | 0 refills | Status: DC

## 2020-01-29 NOTE — Progress Notes (Signed)
PROGRESS NOTE    Cheryl Atkinson  BTD:[id number] DOB: 1930-12-17 DOA: 01/25/2020 PCP: Shelva Majestic, MD     Brief Narrative:  Patient is an 86 year old female with a history of atrial fibrillation, hypertension, and prediabetes who was recently admitted for acute cholecystitis and discharged to rehab on February 1. A cholecystostomy tube had been placed but had accidentally fallen out while at rehab. The night after she was discharged to rehab, she described weakness, shortness of breath, and also had a temperature of 100.5 F. She is brought to the emergency department. It was determined she needed to have the tube replaced. General surgery consulted.  01/29/2020: Patient seen alongside patient's nurse.  Also discussed with the patient's son extensively.  Available records reviewed.  Blood culture done on presentation grew Enterococcus faecalis.  Patient is currently on IV cefazolin.  Patient is stable for discharge, and can be discharged on oral amoxicillin.  Discharge to facility has been recommended the patient is not particularly keen.  Will consult case management team.  Transition of care team is already following patient.  Patient will be discharged once disposition is finalized.  Patient is medically stable.   New events last 24 hours / Subjective: No new complaints No fever chills  Assessment & Plan:   Principal Problem:   Sepsis/bacteremia:   Complete course of antibiotics.  Patient can be discharged on amoxicillin.      Blood culture grew Enterococcus faecalis and Staphylococcus simulans.  Pursue disposition.  Active Problems:  Essential hypertension  Optimized.  Continue current medication.      COPD (chronic obstructive pulmonary disease) (HCC)  Albuterol neb prn  Stable    PVD  Plavix 75 mg daily  Lipitor 40 mg daily (LFT's <2x elevated)    Hypokalemia  Resolved  Monitor BMP  History of recent cholecystitis:  S/P tube placement following dislodgment of  cholecystotomy tube  Looks stable.  Follow-up with interventional radiology on discharge    Atrial fibrillation (HCC)  Eliquis  Controlled rate    Constipation  MiraLAX  DVT prophylaxis: Eliquis Code Status: Full Family Communication: Self Coming From: Home Disposition Plan: SNF versus inpatient rehab recommended, but patient is refusing. Barriers to Discharge: Approval; patient's reluctance to go to SNF again  Consultants:   General surgery  Interventional radiology  Procedures:   Percutaneous cholecystotomy tube placement; 01/27/2020  Antimicrobials:  Anti-infectives (From admission, onward)   Start     Dose/Rate Route Frequency Ordered Stop   01/28/20 2200  ceFAZolin (ANCEF) IVPB 2g/100 mL premix     2 g 200 mL/hr over 30 Minutes Intravenous Every 8 hours 01/28/20 0953     01/25/20 2230  cefTRIAXone (ROCEPHIN) 2 g in sodium chloride 0.9 % 100 mL IVPB  Status:  Discontinued     2 g 200 mL/hr over 30 Minutes Intravenous Every 24 hours 01/25/20 1741 01/28/20 0953   01/25/20 1430  ceFAZolin (ANCEF) IVPB 2g/100 mL premix     2 g 200 mL/hr over 30 Minutes Intravenous To Radiology 01/25/20 1416 01/25/20 1639   01/25/20 1200  piperacillin-tazobactam (ZOSYN) IVPB 3.375 g  Status:  Discontinued     3.375 g 12.5 mL/hr over 240 Minutes Intravenous Every 8 hours 01/25/20 0434 01/25/20 1741   01/25/20 0115  ceFEPIme (MAXIPIME) 2 g in sodium chloride 0.9 % 100 mL IVPB     2 g 200 mL/hr over 30 Minutes Intravenous  Once 01/25/20 0100 01/25/20 0202   01/25/20 0115  metroNIDAZOLE (FLAGYL) IVPB 500  mg     500 mg 100 mL/hr over 60 Minutes Intravenous  Once 01/25/20 0100 01/25/20 0221   01/25/20 0115  vancomycin (VANCOCIN) IVPB 1000 mg/200 mL premix  Status:  Discontinued     1,000 mg 200 mL/hr over 60 Minutes Intravenous  Once 01/25/20 0100 01/25/20 0104   01/25/20 0115  vancomycin (VANCOREADY) IVPB 1250 mg/250 mL     1,250 mg 166.7 mL/hr over 90 Minutes Intravenous  Once 01/25/20  0104 01/25/20 0338       Objective: Vitals:   01/29/20 0434 01/29/20 0442 01/29/20 0747 01/29/20 1138  BP: (!) 149/84  (!) 130/58 128/72  Pulse: 83  72 73  Resp: (!) 22  (!) 22 17  Temp: 97.8 F (36.6 C)  98 F (36.7 C) 98.2 F (36.8 C)  TempSrc: Oral  Oral Oral  SpO2: 97%  98% 98%  Weight:  69.7 kg 69.3 kg   Height:   5\' 6"  (1.676 m)     Intake/Output Summary (Last 24 hours) at 01/29/2020 1243 Last data filed at 01/29/2020 0458 Gross per 24 hour  Intake 500 ml  Output 345 ml  Net 155 ml   Filed Weights   01/28/20 0500 01/29/20 0442 01/29/20 0747  Weight: 69.1 kg 69.7 kg 69.3 kg    Examination:  General exam: Appears calm and comfortable  Respiratory system: Clear to auscultation. Respiratory effort normal.  Cardiovascular system: S1 & S2 heard, RRR.  Gastrointestinal system: Abdomen is soft and nontender.  Percutaneous cholecystotomy tube site is dressed.   Central nervous system: Alert and oriented. No focal neurological deficits.  Patient has capacity to make medical decisions.  Extremities: No leg edema  Data Reviewed: I have personally reviewed following labs and imaging studies  CBC: Recent Labs  Lab 01/25/20 0100 01/25/20 0100 01/25/20 0731 01/26/20 0644 01/27/20 0605 01/28/20 0618 01/29/20 0521  WBC 10.2   < > 12.0* 8.9 8.5 6.9 9.2  NEUTROABS 8.8*  --  9.9*  --   --   --   --   HGB 8.9*   < > 9.9* 9.1* 9.7* 9.4* 9.5*  HCT 28.9*   < > 31.5* 28.4* 29.8* 29.0* 29.5*  MCV 94.8   < > 94.6 92.2 90.9 91.5 91.9  PLT 173   < > 174 167 183 195 195   < > = values in this interval not displayed.   Basic Metabolic Panel: Recent Labs  Lab 01/25/20 0731 01/26/20 0644 01/27/20 0605 01/28/20 0618 01/29/20 0521  NA 140 141 140 141 141  K 3.5 3.0* 3.0* 3.9 3.9  CL 107 107 105 108 107  CO2 22 23 23 22 24   GLUCOSE 109* 79 114* 113* 130*  BUN 24* 12 11 10 8   CREATININE 0.87 0.75 0.73 0.60 0.69  CALCIUM 8.1* 8.5* 8.5* 8.4* 8.5*   GFR: Estimated Creatinine  Clearance: 47.3 mL/min (by C-G formula based on SCr of 0.69 mg/dL). Liver Function Tests: Recent Labs  Lab 01/25/20 0100 01/25/20 0731 01/28/20 0618 01/29/20 0521  AST 494* 334* 26 24  ALT 344* 330* 67* 53*  ALKPHOS 187* 187* 127* 115  BILITOT 1.6* 1.6* 0.6 0.8  PROT 5.7* 6.2* 5.7* 5.8*  ALBUMIN 3.0* 3.1* 2.5* 2.6*   Coagulation Profile: Recent Labs  Lab 01/25/20 0100  INR 1.4*   CBG: Recent Labs  Lab 01/28/20 1942 01/28/20 2350 01/29/20 0432 01/29/20 0745 01/29/20 1127  GLUCAP 133* 106* 107* 103* 120*   Sepsis Labs: Recent Labs  Lab 01/25/20  0100 01/25/20 0340 01/25/20 0731  PROCALCITON  --   --  1.33  LATICACIDVEN 2.0* 1.3 1.2    Recent Results (from the past 240 hour(s))  Blood Culture (routine x 2)     Status: Abnormal   Collection Time: 01/25/20  1:05 AM   Specimen: BLOOD LEFT FOREARM  Result Value Ref Range Status   Specimen Description BLOOD LEFT FOREARM  Final   Special Requests   Final    BOTTLES DRAWN AEROBIC AND ANAEROBIC Blood Culture adequate volume   Culture  Setup Time   Final    GRAM NEGATIVE RODS AEROBIC BOTTLE ONLY CRITICAL RESULT CALLED TO, READ BACK BY AND VERIFIED WITH: Cindra Presume PharmD 16:25 01/25/20 ([email or web address]) Performed at [facility], [address] [place], [place] [address]    Culture KLEBSIELLA PNEUMONIAE (A)  Final   Report Status 01/28/2020 FINAL  Final   Organism ID, Bacteria KLEBSIELLA PNEUMONIAE  Final      Susceptibility   Klebsiella pneumoniae - MIC*    AMPICILLIN >=32 RESISTANT Resistant     CEFAZOLIN <=4 SENSITIVE Sensitive     CEFEPIME <=0.12 SENSITIVE Sensitive     CEFTAZIDIME <=1 SENSITIVE Sensitive     CEFTRIAXONE <=0.25 SENSITIVE Sensitive     CIPROFLOXACIN <=0.25 SENSITIVE Sensitive     GENTAMICIN <=1 SENSITIVE Sensitive     IMIPENEM <=0.25 SENSITIVE Sensitive     TRIMETH/SULFA <=20 SENSITIVE Sensitive     AMPICILLIN/SULBACTAM 4 SENSITIVE Sensitive     PIP/TAZO 8 SENSITIVE Sensitive     * KLEBSIELLA  PNEUMONIAE  Blood Culture (routine x 2)     Status: Abnormal   Collection Time: 01/25/20  1:05 AM   Specimen: BLOOD  Result Value Ref Range Status   Specimen Description BLOOD LEFT ANTECUBITAL  Final   Special Requests   Final    BOTTLES DRAWN AEROBIC AND ANAEROBIC Blood Culture adequate volume   Culture  Setup Time   Final    GRAM NEGATIVE RODS AEROBIC BOTTLE ONLY CRITICAL VALUE NOTED.  VALUE IS CONSISTENT WITH PREVIOUSLY REPORTED AND CALLED VALUE.    Culture (A)  Final    KLEBSIELLA PNEUMONIAE SUSCEPTIBILITIES PERFORMED ON PREVIOUS CULTURE WITHIN THE LAST 5 DAYS. Performed at [facility], [address], [place], [place] [address]    Report Status 01/29/2020 FINAL  Final  Blood Culture ID Panel (Reflexed)     Status: Abnormal   Collection Time: 01/25/20  1:05 AM  Result Value Ref Range Status   Enterococcus species NOT DETECTED NOT DETECTED Final   Listeria monocytogenes NOT DETECTED NOT DETECTED Final   Staphylococcus species NOT DETECTED NOT DETECTED Final   Staphylococcus aureus (BCID) NOT DETECTED NOT DETECTED Final   Streptococcus species NOT DETECTED NOT DETECTED Final   Streptococcus agalactiae NOT DETECTED NOT DETECTED Final   Streptococcus pneumoniae NOT DETECTED NOT DETECTED Final   Streptococcus pyogenes NOT DETECTED NOT DETECTED Final   Acinetobacter baumannii NOT DETECTED NOT DETECTED Final   Enterobacteriaceae species DETECTED (A) NOT DETECTED Final    Comment: Enterobacteriaceae represent a large family of gram-negative bacteria, not a single organism. CRITICAL RESULT CALLED TO, READ BACK BY AND VERIFIED WITH: Cindra Presume PharmD 16:25 01/25/20 ([email or web address])    Enterobacter cloacae complex NOT DETECTED NOT DETECTED Final   Escherichia coli NOT DETECTED NOT DETECTED Final   Klebsiella oxytoca NOT DETECTED NOT DETECTED Final   Klebsiella pneumoniae DETECTED (A) NOT DETECTED Final    Comment: CRITICAL RESULT CALLED TO, READ BACK BY  AND VERIFIED WITH: Cindra Presume  PharmD 16:25 01/25/20 ([email or web address])    Proteus species NOT DETECTED NOT DETECTED Final   Serratia marcescens NOT DETECTED NOT DETECTED Final   Carbapenem resistance NOT DETECTED NOT DETECTED Final   Haemophilus influenzae NOT DETECTED NOT DETECTED Final   Neisseria meningitidis NOT DETECTED NOT DETECTED Final   Pseudomonas aeruginosa NOT DETECTED NOT DETECTED Final   Candida albicans NOT DETECTED NOT DETECTED Final   Candida glabrata NOT DETECTED NOT DETECTED Final   Candida krusei NOT DETECTED NOT DETECTED Final   Candida parapsilosis NOT DETECTED NOT DETECTED Final   Candida tropicalis NOT DETECTED NOT DETECTED Final    Comment: Performed at [facility], [address] [place], [place] [address]  SARS CORONAVIRUS 2 (TAT 6-24 HRS) Nasopharyngeal Nasopharyngeal Swab     Status: None   Collection Time: 01/25/20  4:00 AM   Specimen: Nasopharyngeal Swab  Result Value Ref Range Status   SARS Coronavirus 2 NEGATIVE NEGATIVE Final    Comment: (NOTE) SARS-CoV-2 target nucleic acids are NOT DETECTED. The SARS-CoV-2 RNA is generally detectable in upper and lower respiratory specimens during the acute phase of infection. Negative results do not preclude SARS-CoV-2 infection, do not rule out co-infections with other pathogens, and should not be used as the sole basis for treatment or other patient management decisions. Negative results must be combined with clinical observations, patient history, and epidemiological information. The expected result is Negative. Fact Sheet for Patients: HairSlick.no Fact Sheet for Healthcare Providers: quierodirigir.com This test is not yet approved or cleared by the [place] FDA and  has been authorized for detection and/or diagnosis of SARS-CoV-2 by FDA under an Emergency Use Authorization (EUA). This EUA will remain  in effect (meaning this test can be used) for the duration of the COVID-19  declaration under Section 56 4(b)(1) of the Act, 21 U.S.C. section 360bbb-3(b)(1), unless the authorization is terminated or revoked sooner. Performed at [facility], [address] [place], [place] [address]   Urine culture     Status: Abnormal   Collection Time: 01/25/20  6:46 AM   Specimen: In/Out Cath Urine  Result Value Ref Range Status   Specimen Description IN/OUT CATH URINE  Final   Special Requests   Final    NONE Performed at [facility], [address], [place], [place] [address]    Culture (A)  Final    100 COLONIES/mL STAPHYLOCOCCUS SIMULANS 300 COLONIES/mL ENTEROCOCCUS FAECALIS    Report Status 01/28/2020 FINAL  Final   Organism ID, Bacteria STAPHYLOCOCCUS SIMULANS (A)  Final   Organism ID, Bacteria ENTEROCOCCUS FAECALIS (A)  Final      Susceptibility   Enterococcus faecalis - MIC*    AMPICILLIN <=2 SENSITIVE Sensitive     NITROFURANTOIN <=16 SENSITIVE Sensitive     VANCOMYCIN 2 SENSITIVE Sensitive     * 300 COLONIES/mL ENTEROCOCCUS FAECALIS   Staphylococcus simulans - MIC*    CIPROFLOXACIN >=8 RESISTANT Resistant     GENTAMICIN <=0.5 SENSITIVE Sensitive     NITROFURANTOIN <=16 SENSITIVE Sensitive     OXACILLIN >=4 RESISTANT Resistant     TETRACYCLINE <=1 SENSITIVE Sensitive     VANCOMYCIN 1 SENSITIVE Sensitive     TRIMETH/SULFA <=10 SENSITIVE Sensitive     CLINDAMYCIN 4 RESISTANT Resistant     RIFAMPIN 1 SENSITIVE Sensitive     Inducible Clindamycin NEGATIVE Sensitive     * 100 COLONIES/mL STAPHYLOCOCCUS SIMULANS  MRSA PCR Screening  Status: None   Collection Time: 01/27/20 11:30 PM   Specimen: Nasopharyngeal  Result Value Ref Range Status   MRSA by PCR NEGATIVE NEGATIVE Final    Comment:        The GeneXpert MRSA Assay (FDA approved for NASAL specimens only), is one component of a comprehensive MRSA colonization surveillance program. It is not intended to diagnose MRSA infection nor to guide or monitor treatment for MRSA  infections. Performed at [facility], [address], [place],  [address]       Radiology Studies: No results found.   Scheduled Meds: . amLODipine  5 mg Oral Daily  . apixaban  5 mg Oral BID  . atorvastatin  40 mg Oral Daily  . clopidogrel  75 mg Oral Q breakfast  . irbesartan  300 mg Oral Daily  . pantoprazole  40 mg Oral Daily  . Rotigotine  3 mg Transdermal QHS  . sodium chloride flush  5 mL Intracatheter Q8H   Continuous Infusions: . sodium chloride 10 mL/hr at 01/25/20 1430  .  ceFAZolin (ANCEF) IV 2 g (01/29/20 0454)     LOS: 4 days    Time spent: 25 minutes   Bonnell Public, MD [facility] 01/29/2020, 12:43 PM   Available via Epic secure chat 7am-7pm After these hours, please refer to coverage provider listed on [email or web address]

## 2020-01-29 NOTE — Discharge Summary (Signed)
Physician Discharge Summary  Patient ID: DELANA MANGANELLO MRN: [id number] DOB/AGE: 02/02/1931 86 y.o.  Admit date: 01/25/2020 Discharge date: 01/29/2020  Admission Diagnoses:  Discharge Diagnoses:  Principal Problem:   Sepsis [facility]) Active Problems:   Essential hypertension   COPD (chronic obstructive pulmonary disease) (HCC)   DNR (do not resuscitate)   Atherosclerotic PVD with intermittent claudication (HCC)   Hypokalemia   Acute cholangitis   Atrial fibrillation (HCC)   Bacteremia   Discharged Condition: stable  Hospital Course: Patient is an 86 year old female with a history of atrial fibrillation, hypertension, and prediabetes who was recently admitted for acute cholecystitis and discharged to rehab on January 03, 2020. A cholecystostomy tube had been placed but had accidentally fallen out while at rehab. The night after patient was discharged from rehab, she described weakness, shortness of breath, and also had a temperature of 100.5 F. She was brought to the emergency department for further assessment and management.  Patient has significant right upper quadrant tenderness.  CT scan of the abdomen and pelvis cannot rule out acute cholecystitis.  Patient was admitted with possible sepsis secondary to acute cholecystitis versus cholangitis and was started on broad-spectrum antibiotics, and subsequently to protect IV cefazolin.  Patient was discharged on oral amoxicillin.  Blood culture grew Enterococcus faecalis.  Interventional radiology team was consulted and cholecystostomy tube was replaced.  Patient has improved significantly and will be discharged to the care of the primary care provider.   Sepsis/bacteremia:  Blood culture grew Enterococcus faecalis.   Complete course of antibiotics.   Patient can be discharged on amoxicillin.                  Essential hypertension:             Optimized.  Continue current medication.      COPD (chronic obstructive pulmonary disease)  (HCC):             Albuterol neb prn             Stable    PVD:             Plavix 75 mg daily             Lipitor 40 mg daily (LFT's <2x elevated)    Hypokalemia:             Resolved             Continue to monitor electrolytes on discharge.    History of recent cholecystitis:             S/P tube placement following dislodgment of cholecystotomy tube             Looks stable.             Follow-up with interventional radiology on discharge    Atrial fibrillation [facility]):             Eliquis             Controlled rate    Constipation:             MiraLAX  Consults: Interventional radiology  Significant Diagnostic Studies: Blood culture grew Enterococcus faecalis.  Discharge Exam: Blood pressure 128/72, pulse 73, temperature 98.2 F (36.8 C), temperature source Oral, resp. rate 17, height 5\' 6"  (1.676 m), weight 69.3 kg, SpO2 98 %.   Disposition: Discharge disposition: 01-Home or Self Care   Discharge Instructions    Diet - low sodium heart healthy   Complete  by: As directed    Increase activity slowly   Complete by: As directed      Allergies as of 01/29/2020      Reactions   Codeine Other (See Comments)   REACTION: Syncope       Medication List    STOP taking these medications   Olmesartan-amLODIPine-HCTZ 40-10-12.5 MG Tabs Commonly known as: Tribenzor     TAKE these medications   acetaminophen 325 MG tablet Commonly known as: TYLENOL Take 2 tablets (650 mg total) by mouth every 6 (six) hours as needed for mild pain, moderate pain or fever (or Fever >/= 101).   Albuterol Sulfate 108 (90 Base) MCG/ACT Aepb Commonly known as: ProAir RespiClick Inhale 2 puffs into the lungs every 6 (six) hours as needed (shortness of breath from COPD).   amLODipine 5 MG tablet Commonly known as: NORVASC Take 1 tablet (5 mg total) by mouth daily. Start taking on: January 30, 2020   amoxicillin 500 MG capsule Commonly known as: AMOXIL Take 1 capsule (500 mg  total) by mouth 3 (three) times daily for 10 days.   apixaban 5 MG Tabs tablet Commonly known as: ELIQUIS Take 1 tablet (5 mg total) by mouth 2 (two) times daily. Start 01/04/2020.   atorvastatin 40 MG tablet Commonly known as: LIPITOR Take 1 tablet (40 mg total) by mouth daily.   clopidogrel 75 MG tablet Commonly known as: PLAVIX Take 1 tablet (75 mg total) by mouth daily with breakfast. MUST KEEP APPOINTMENT 07/20/19 WITH DR Gwenlyn Found FOR FUTURE REFILLS   irbesartan 300 MG tablet Commonly known as: AVAPRO Take 1 tablet (300 mg total) by mouth daily. Start taking on: January 30, 2020   metFORMIN 500 MG tablet Commonly known as: GLUCOPHAGE TAKE 1 (1) TABLET BY MOUTH DAILY What changed:   how much to take  how to take this  when to take this  additional instructions   polyethylene glycol 17 g packet Commonly known as: MIRALAX / GLYCOLAX Take 17 g by mouth 2 (two) times daily. Reported on 03/14/2016   PRESERVISION AREDS 2 PO Take 1 tablet by mouth 2 (two) times daily.   Rotigotine 3 MG/24HR Pt24 Place 3 mg onto the skin at bedtime.   sodium chloride flush 0.9 % Soln Commonly known as: NS 5 mLs by Intracatheter route every 8 (eight) hours.            Durable Medical Equipment  (From admission, onward)         Start     Ordered   01/29/20 1445  For home use only DME 3 n 1  Once     01/29/20 1445         Follow-up Information    Corrie Mckusick, DO Follow up.   Specialties: Interventional Radiology, Radiology Why: IR scheduler will call you with appointment date/time (typically 6-8 weeks after your procedure). Please call with any questions or concerns. Contact information: [address] [place] [address] [phone]        Georganna Skeans, MD. Schedule an appointment as soon as possible for a visit in 7 week(s).   Specialty: General Surgery Why: to discuss gallbladder Contact information: [employer] [employer] [place]  [address] [phone]           Signed: Bonnell Public 01/29/2020, 3:19 PM

## 2020-01-29 NOTE — Plan of Care (Signed)
  Problem: Education: Goal: Knowledge of General Education information will improve Description: Including pain rating scale, medication(s)/side effects and non-pharmacologic comfort measures 01/29/2020 0832 by Elba Barman, RN Outcome: Progressing 01/29/2020 0831 by Elba Barman, RN Outcome: Progressing   Problem: Health Behavior/Discharge Planning: Goal: Ability to manage health-related needs will improve 01/29/2020 0832 by Elba Barman, RN Outcome: Progressing 01/29/2020 0831 by Elba Barman, RN Outcome: Progressing   Problem: Clinical Measurements: Goal: Ability to maintain clinical measurements within normal limits will improve 01/29/2020 0832 by Elba Barman, RN Outcome: Progressing 01/29/2020 0831 by Elba Barman, RN Outcome: Progressing Goal: Will remain free from infection 01/29/2020 0832 by Elba Barman, RN Outcome: Progressing 01/29/2020 0831 by Elba Barman, RN Outcome: Progressing Goal: Diagnostic test results will improve 01/29/2020 0832 by Elba Barman, RN Outcome: Progressing 01/29/2020 0831 by Elba Barman, RN Outcome: Progressing Goal: Respiratory complications will improve 01/29/2020 0832 by Elba Barman, RN Outcome: Progressing 01/29/2020 0831 by Elba Barman, RN Outcome: Progressing Goal: Cardiovascular complication will be avoided 01/29/2020 0832 by Elba Barman, RN Outcome: Progressing 01/29/2020 0831 by Elba Barman, RN Outcome: Progressing   Problem: Activity: Goal: Risk for activity intolerance will decrease 01/29/2020 0832 by Elba Barman, RN Outcome: Progressing 01/29/2020 0831 by Elba Barman, RN Outcome: Progressing   Problem: Nutrition: Goal: Adequate nutrition will be maintained 01/29/2020 0832 by Elba Barman, RN Outcome: Progressing 01/29/2020 0831 by Elba Barman, RN Outcome: Progressing   Problem: Coping: Goal: Level of anxiety will decrease 01/29/2020 0832 by Elba Barman, RN Outcome:  Progressing 01/29/2020 0831 by Elba Barman, RN Outcome: Progressing   Problem: Elimination: Goal: Will not experience complications related to bowel motility 01/29/2020 0832 by Elba Barman, RN Outcome: Progressing 01/29/2020 0831 by Elba Barman, RN Outcome: Progressing Goal: Will not experience complications related to urinary retention 01/29/2020 0832 by Elba Barman, RN Outcome: Progressing 01/29/2020 0831 by Elba Barman, RN Outcome: Progressing   Problem: Pain Managment: Goal: General experience of comfort will improve 01/29/2020 0832 by Elba Barman, RN Outcome: Progressing 01/29/2020 0831 by Elba Barman, RN Outcome: Progressing   Problem: Safety: Goal: Ability to remain free from injury will improve 01/29/2020 0832 by Elba Barman, RN Outcome: Progressing 01/29/2020 0831 by Elba Barman, RN Outcome: Progressing   Problem: Skin Integrity: Goal: Risk for impaired skin integrity will decrease 01/29/2020 0832 by Elba Barman, RN Outcome: Progressing 01/29/2020 0831 by Elba Barman, RN Outcome: Progressing

## 2020-01-29 NOTE — TOC Transition Note (Signed)
Transition of Care [facility]) - CM/SW Discharge Note   Patient Details  Name: TERRINA DOCTER MRN: [id number] Date of Birth: [birth year]  Transition of Care [facility]) CM/SW Contact:  Deveron Furlong, RN 01/29/2020, 3:11 PM   Clinical Narrative:    Patient to d/c home with son and [facility] PT.  Patient adamantly refuses SNF.  3n1 delivered to room for d/c.    Cassie from [facility] confirmed that they were following patient PTA and will begin care ASAP.    Patient will need HH/F2F order for PT.  Patient refuses other [facility] services.    Final next level of care: Home w Home Health Services Barriers to Discharge: No Barriers Identified    Discharge Plan and Services In-house Referral: Clinical Social Work Discharge Planning Services: CM Consult Post Acute Care Choice: Home Health, Resumption of Svcs/PTA Provider          DME Arranged: 3-N-1 DME Agency: [employer]     Representative spoke with at DME Agency: delivered by [facility]   [facility] Agency: [facility] Date [facility] Agency Contacted: 01/29/20 Time HH Agency Contacted: [id number] Representative spoke with at [facility] Agency: Cassie  Social Determinants of Health (SDOH) Interventions     Readmission Risk Interventions No flowsheet data found.

## 2020-01-31 ENCOUNTER — Telehealth (HOSPITAL_COMMUNITY): Payer: Self-pay | Admitting: Pharmacist

## 2020-01-31 ENCOUNTER — Other Ambulatory Visit: Payer: Self-pay | Admitting: *Deleted

## 2020-01-31 ENCOUNTER — Other Ambulatory Visit: Payer: Self-pay | Admitting: Cardiovascular Disease

## 2020-01-31 MED ORDER — CEPHALEXIN 500 MG PO CAPS: 500.0000 mg | ORAL_CAPSULE | Freq: Three times a day (TID) | ORAL | 0 refills | Status: DC

## 2020-01-31 NOTE — Patient Outreach (Signed)
Member screened for potential [facility] Care Management needs as a benefit of [employer].  Made aware by [facility] Liaison of member's dc from hospital over the weekend.   Telephone call made to Mrs. Westhoff. She recently dc from [facility] then readmitted to hospital after being home 1 day. Mrs. Sholl is agreeable to [facility] referral due to high risk for readmission. Expressed appreciation of call.   Referral sent to [facility].  Raiford Noble, MSN-Ed, RN,BSN [facility] Post Acute Care Coordinator [phone] [facility]) [phone]  (Toll free office)

## 2020-01-31 NOTE — Progress Notes (Signed)
Noted Blood culture susceptibilities from the klebsiella in the blood on 2/23 while patient inpatient and saw that the Klebsiella was resistant to the amoxicillin that the patient was discharged on.  I spoke with the [facility] 1 on call, Dr. Jola Schmidt, and sent in a new prescription for cephalexin 500 mg TID x 10 days  to the [facility] on [address] to complete 14 total days of effective therapy for Klebsiella bacteremia secondary to a GI source.   I called Cheryl Atkinson and informed her of this change. I also counseled her on taking a probiotic or eating yogurt if she began to experience any diarrhea.    Sharin Mons, PharmD, BCPS, BCIDP Infectious Diseases Clinical Pharmacist Phone: [phone] 01/31/2020 3:26 PM

## 2020-02-01 ENCOUNTER — Telehealth: Payer: Self-pay

## 2020-02-01 DIAGNOSIS — J449 Chronic obstructive pulmonary disease, unspecified: Secondary | ICD-10-CM | POA: Diagnosis not present

## 2020-02-01 DIAGNOSIS — K219 Gastro-esophageal reflux disease without esophagitis: Secondary | ICD-10-CM | POA: Diagnosis not present

## 2020-02-01 DIAGNOSIS — A419 Sepsis, unspecified organism: Secondary | ICD-10-CM | POA: Diagnosis not present

## 2020-02-01 DIAGNOSIS — E890 Postprocedural hypothyroidism: Secondary | ICD-10-CM | POA: Diagnosis not present

## 2020-02-01 DIAGNOSIS — N3090 Cystitis, unspecified without hematuria: Secondary | ICD-10-CM | POA: Diagnosis not present

## 2020-02-01 DIAGNOSIS — N390 Urinary tract infection, site not specified: Secondary | ICD-10-CM | POA: Diagnosis not present

## 2020-02-01 DIAGNOSIS — I10 Essential (primary) hypertension: Secondary | ICD-10-CM | POA: Diagnosis not present

## 2020-02-01 DIAGNOSIS — K5900 Constipation, unspecified: Secondary | ICD-10-CM | POA: Diagnosis not present

## 2020-02-01 DIAGNOSIS — G2581 Restless legs syndrome: Secondary | ICD-10-CM | POA: Diagnosis not present

## 2020-02-01 DIAGNOSIS — D649 Anemia, unspecified: Secondary | ICD-10-CM | POA: Diagnosis not present

## 2020-02-01 DIAGNOSIS — I739 Peripheral vascular disease, unspecified: Secondary | ICD-10-CM | POA: Diagnosis not present

## 2020-02-01 DIAGNOSIS — I4891 Unspecified atrial fibrillation: Secondary | ICD-10-CM | POA: Diagnosis not present

## 2020-02-01 DIAGNOSIS — K810 Acute cholecystitis: Secondary | ICD-10-CM | POA: Diagnosis not present

## 2020-02-01 DIAGNOSIS — M1991 Primary osteoarthritis, unspecified site: Secondary | ICD-10-CM | POA: Diagnosis not present

## 2020-02-01 NOTE — Telephone Encounter (Signed)
Transition Care Management Follow-up Telephone Call  Date of discharge and from where: [facility] 01/29/20  How have you been since you were released from the hospital? Stable   Any questions or concerns? No   Items Reviewed:  Did the pt receive and understand the discharge instructions provided? Yes   Medications obtained and verified? Yes   Any new allergies since your discharge? No   Dietary orders reviewed? Yes  Do you have support at home? Yes   Other (ie: DME, Home Health, etc) HH services with [facility]   Functional Questionnaire: (I = Independent and D = Dependent) ADL's: D  Bathing/Dressing- D   Meal Prep- D  Eating- I  Maintaining continence- I  Transferring/Ambulation- D  Managing Meds- D   Follow up appointments reviewed:    PCP Hospital f/u appt confirmed? Patient states that she wants to hold on appointment due to debility and wanting to physically come in to the office    Specialist Hospital f/u appt confirmed? No; patient aware that she is to follow up with surgeon  Are transportation arrangements needed? n/a  If their condition worsens, is the pt aware to call  their PCP or go to the ED? Yes  Was the patient provided with contact information for the PCP's office or ED? Yes  Was the pt encouraged to call back with questions or concerns? Yes   Patient would not schedule appointment at this time

## 2020-02-01 NOTE — Telephone Encounter (Signed)
Noted thanks-would prefer to see her now even if just virtually but we will respect her decision

## 2020-02-02 ENCOUNTER — Other Ambulatory Visit: Payer: Self-pay

## 2020-02-02 ENCOUNTER — Telehealth: Payer: Self-pay | Admitting: Family Medicine

## 2020-02-02 DIAGNOSIS — N3090 Cystitis, unspecified without hematuria: Secondary | ICD-10-CM | POA: Diagnosis not present

## 2020-02-02 DIAGNOSIS — I4891 Unspecified atrial fibrillation: Secondary | ICD-10-CM | POA: Diagnosis not present

## 2020-02-02 DIAGNOSIS — N390 Urinary tract infection, site not specified: Secondary | ICD-10-CM | POA: Diagnosis not present

## 2020-02-02 DIAGNOSIS — G2581 Restless legs syndrome: Secondary | ICD-10-CM | POA: Diagnosis not present

## 2020-02-02 DIAGNOSIS — A419 Sepsis, unspecified organism: Secondary | ICD-10-CM | POA: Diagnosis not present

## 2020-02-02 DIAGNOSIS — K810 Acute cholecystitis: Secondary | ICD-10-CM | POA: Diagnosis not present

## 2020-02-02 MED FILL — NORMAL SALINE FLUSH SYRINGE: 0.9 | 40 days supply | Qty: 1200 | Fill #0

## 2020-02-02 NOTE — Telephone Encounter (Signed)
Physical Therapist from [facility] called requesting verbal orders for pt. Wanting to continue PT for 2 x per week for 4 weeks and 1 x per week for 1 week. Focusing on strength, balance, and gate training. Please call back at [phone].

## 2020-02-02 NOTE — Telephone Encounter (Signed)
Returned [place] call and left VO on her vm.

## 2020-02-02 NOTE — Patient Outreach (Signed)
Red Emmi:  Reason for alert:  Scheduled follow up?  -no  Placed call to patient and explained reason for call. Patient reports she called MD office yesterday to schedule follow up. Reports she will follow up as planned.  PLAN: no needs identified.  Rowe Pavy, RN, BSN, CEN [facility] [employer] [phone]

## 2020-02-03 DIAGNOSIS — A419 Sepsis, unspecified organism: Secondary | ICD-10-CM | POA: Diagnosis not present

## 2020-02-03 DIAGNOSIS — I4891 Unspecified atrial fibrillation: Secondary | ICD-10-CM | POA: Diagnosis not present

## 2020-02-03 DIAGNOSIS — G2581 Restless legs syndrome: Secondary | ICD-10-CM | POA: Diagnosis not present

## 2020-02-03 DIAGNOSIS — K810 Acute cholecystitis: Secondary | ICD-10-CM | POA: Diagnosis not present

## 2020-02-03 DIAGNOSIS — N390 Urinary tract infection, site not specified: Secondary | ICD-10-CM | POA: Diagnosis not present

## 2020-02-03 DIAGNOSIS — N3090 Cystitis, unspecified without hematuria: Secondary | ICD-10-CM | POA: Diagnosis not present

## 2020-02-07 DIAGNOSIS — G2581 Restless legs syndrome: Secondary | ICD-10-CM | POA: Diagnosis not present

## 2020-02-07 DIAGNOSIS — N390 Urinary tract infection, site not specified: Secondary | ICD-10-CM | POA: Diagnosis not present

## 2020-02-07 DIAGNOSIS — A419 Sepsis, unspecified organism: Secondary | ICD-10-CM | POA: Diagnosis not present

## 2020-02-07 DIAGNOSIS — N3090 Cystitis, unspecified without hematuria: Secondary | ICD-10-CM | POA: Diagnosis not present

## 2020-02-07 DIAGNOSIS — I4891 Unspecified atrial fibrillation: Secondary | ICD-10-CM | POA: Diagnosis not present

## 2020-02-07 DIAGNOSIS — K810 Acute cholecystitis: Secondary | ICD-10-CM | POA: Diagnosis not present

## 2020-02-08 DIAGNOSIS — G2581 Restless legs syndrome: Secondary | ICD-10-CM | POA: Diagnosis not present

## 2020-02-08 DIAGNOSIS — K810 Acute cholecystitis: Secondary | ICD-10-CM | POA: Diagnosis not present

## 2020-02-08 DIAGNOSIS — I4891 Unspecified atrial fibrillation: Secondary | ICD-10-CM | POA: Diagnosis not present

## 2020-02-08 DIAGNOSIS — N390 Urinary tract infection, site not specified: Secondary | ICD-10-CM | POA: Diagnosis not present

## 2020-02-08 DIAGNOSIS — N3090 Cystitis, unspecified without hematuria: Secondary | ICD-10-CM | POA: Diagnosis not present

## 2020-02-08 DIAGNOSIS — A419 Sepsis, unspecified organism: Secondary | ICD-10-CM | POA: Diagnosis not present

## 2020-02-09 DIAGNOSIS — M47896 Other spondylosis, lumbar region: Secondary | ICD-10-CM | POA: Diagnosis not present

## 2020-02-09 DIAGNOSIS — M47817 Spondylosis without myelopathy or radiculopathy, lumbosacral region: Secondary | ICD-10-CM | POA: Diagnosis not present

## 2020-02-09 DIAGNOSIS — N390 Urinary tract infection, site not specified: Secondary | ICD-10-CM | POA: Diagnosis not present

## 2020-02-09 DIAGNOSIS — A419 Sepsis, unspecified organism: Secondary | ICD-10-CM | POA: Diagnosis not present

## 2020-02-09 DIAGNOSIS — N3090 Cystitis, unspecified without hematuria: Secondary | ICD-10-CM | POA: Diagnosis not present

## 2020-02-09 DIAGNOSIS — K810 Acute cholecystitis: Secondary | ICD-10-CM | POA: Diagnosis not present

## 2020-02-09 DIAGNOSIS — G894 Chronic pain syndrome: Secondary | ICD-10-CM | POA: Diagnosis not present

## 2020-02-09 DIAGNOSIS — M545 Low back pain: Secondary | ICD-10-CM | POA: Diagnosis not present

## 2020-02-09 DIAGNOSIS — G2581 Restless legs syndrome: Secondary | ICD-10-CM | POA: Diagnosis not present

## 2020-02-09 DIAGNOSIS — I4891 Unspecified atrial fibrillation: Secondary | ICD-10-CM | POA: Diagnosis not present

## 2020-02-10 ENCOUNTER — Encounter: Payer: Self-pay | Admitting: Family Medicine

## 2020-02-10 ENCOUNTER — Ambulatory Visit (INDEPENDENT_AMBULATORY_CARE_PROVIDER_SITE_OTHER): Payer: Medicare Other | Admitting: Family Medicine

## 2020-02-10 VITALS — Ht 66.0 in | Wt 148.0 lb

## 2020-02-10 DIAGNOSIS — R609 Edema, unspecified: Secondary | ICD-10-CM | POA: Diagnosis not present

## 2020-02-10 DIAGNOSIS — I4891 Unspecified atrial fibrillation: Secondary | ICD-10-CM | POA: Diagnosis not present

## 2020-02-10 DIAGNOSIS — I10 Essential (primary) hypertension: Secondary | ICD-10-CM

## 2020-02-10 DIAGNOSIS — K810 Acute cholecystitis: Secondary | ICD-10-CM | POA: Diagnosis not present

## 2020-02-10 DIAGNOSIS — N3090 Cystitis, unspecified without hematuria: Secondary | ICD-10-CM | POA: Diagnosis not present

## 2020-02-10 DIAGNOSIS — N390 Urinary tract infection, site not specified: Secondary | ICD-10-CM | POA: Diagnosis not present

## 2020-02-10 DIAGNOSIS — R0600 Dyspnea, unspecified: Secondary | ICD-10-CM | POA: Diagnosis not present

## 2020-02-10 DIAGNOSIS — G2581 Restless legs syndrome: Secondary | ICD-10-CM | POA: Diagnosis not present

## 2020-02-10 DIAGNOSIS — A419 Sepsis, unspecified organism: Secondary | ICD-10-CM | POA: Diagnosis not present

## 2020-02-10 DIAGNOSIS — R0609 Other forms of dyspnea: Secondary | ICD-10-CM

## 2020-02-10 MED ORDER — HYDROCHLOROTHIAZIDE 12.5 MG PO TABS: 12.5000 mg | ORAL_TABLET | Freq: Every day | ORAL | 3 refills | Status: DC

## 2020-02-10 NOTE — Progress Notes (Signed)
Phone [phone] Virtual visit via phonenote   Subjective:   Chief Complaint  Patient presents with  . Telemed  . Follow-up   This visit type was conducted due to national recommendations for restrictions regarding the COVID-19 Pandemic (e.g. social distancing).  This format is felt to be most appropriate for this patient at this time balancing risks to patient and risks to population by having him in for in person visit.  All issues noted in this document were discussed and addressed.  No physical exam was performed (except for noted visual exam or audio findings with Telehealth visits).  The patient has consented to conduct a Telehealth visit and understands insurance will be billed.   Our team/I connected with [name] at  2:20 PM EST by phone (patient did not have equipment for webex) and verified that I am speaking with the correct person using two identifiers.  Location patient: Home-O2 Location provider:  [facility], office Persons participating in the virtual visit:  patient  Time on phone: 24 minutes Counseling provided about swelling and need potentially for emergency room visit  Our team/I discussed the limitations of evaluation and management by telemedicine and the availability of in person appointments. In light of current covid-19 pandemic, patient also understands that we are trying to protect them by minimizing in office contact if at all possible.  The patient expressed consent for telemedicine visit and agreed to proceed. Patient understands insurance will be billed.   Past Medical History-  Patient Active Problem List   Diagnosis Date Noted  . Memory loss 04/11/2016    Priority: High  . Renal artery stenosis (HCC) 09/27/2015    Priority: High  . Claudication (HCC) 05/04/2015    Priority: High  . Atherosclerotic PVD with intermittent claudication (HCC) 04/26/2015    Priority: High  . DNR (do not resuscitate) 09/29/2014    Priority: High  . Insulin  resistance 07/19/2014    Priority: High  . LOW BACK PAIN 09/25/2007    Priority: High  . Major depression 03/25/2018    Priority: Medium  . BPPV (benign paroxysmal positional vertigo) 07/12/2016    Priority: Medium  . Hyperlipidemia 06/30/2015    Priority: Medium  . Former smoker 09/29/2014    Priority: Medium  . COPD (chronic obstructive pulmonary disease) (HCC) 09/20/2009    Priority: Medium  . Iron deficiency anemia 11/09/2007    Priority: Medium  . RESTLESS LEG SYNDROME 09/25/2007    Priority: Medium  . Essential hypertension 09/25/2007    Priority: Medium  . GERD 09/25/2007    Priority: Medium  . Osteoporosis 09/25/2007    Priority: Medium  . Cat bite of right hand 12/21/2016    Priority: Low  . Mallet toe of right foot 10/20/2015    Priority: Low  . Tinnitus 12/31/2014    Priority: Low  . Multinodular goiter 08/30/2013    Priority: Low  . Spinal stenosis of lumbar region at multiple levels 09/29/2012    Priority: Low  . HIP PAIN, BILATERAL 07/16/2010    Priority: Low  . CONSTIPATION, CHRONIC 09/20/2009    Priority: Low  . History of UTI 11/09/2007    Priority: Low  . Bacteremia 01/28/2020  . Sepsis (HCC) 01/25/2020  . Acute cholangitis 01/25/2020  . Atrial fibrillation (HCC) 01/25/2020  . Acute cholecystitis 12/29/2019  . UTI (urinary tract infection) 12/29/2019  . Adrenal nodule (HCC) 12/29/2019  . Abdominal aortic ectasia (HCC) 12/29/2019  . Hypokalemia 12/21/2016    Medications- reviewed and updated  Current Outpatient Medications  Medication Sig Dispense Refill  . acetaminophen (TYLENOL) 325 MG tablet Take 2 tablets (650 mg total) by mouth every 6 (six) hours as needed for mild pain, moderate pain or fever (or Fever >/= 101).    . Albuterol Sulfate (PROAIR RESPICLICK) 108 (90 Base) MCG/ACT AEPB Inhale 2 puffs into the lungs every 6 (six) hours as needed (shortness of breath from COPD). 1 each 5  . amLODipine (NORVASC) 5 MG tablet Take 1 tablet (5 mg  total) by mouth daily. 30 tablet 0  . apixaban (ELIQUIS) 5 MG TABS tablet Take 1 tablet (5 mg total) by mouth 2 (two) times daily. Start 01/04/2020.    Marland Kitchen atorvastatin (LIPITOR) 40 MG tablet Take 1 tablet (40 mg total) by mouth daily. 90 tablet 3  . clopidogrel (PLAVIX) 75 MG tablet Take 1 tablet (75 mg total) by mouth daily. 90 tablet 1  . irbesartan (AVAPRO) 300 MG tablet Take 1 tablet (300 mg total) by mouth daily. 30 tablet 0  . metFORMIN (GLUCOPHAGE) 500 MG tablet TAKE 1 (1) TABLET BY MOUTH DAILY (Patient taking differently: Take 500 mg by mouth daily with breakfast. ) 90 tablet 3  . Multiple Vitamins-Minerals (PRESERVISION AREDS 2 PO) Take 1 tablet by mouth 2 (two) times daily.    . polyethylene glycol (MIRALAX / GLYCOLAX) 17 g packet Take 17 g by mouth 2 (two) times daily. Reported on 03/14/2016    . Rotigotine 3 MG/24HR PT24 Place 3 mg onto the skin at bedtime. 90 patch 3  . sodium chloride flush (NS) 0.9 % SOLN 5 mLs by Intracatheter route every 8 (eight) hours.     No current facility-administered medications for this visit.     Objective:  Ht 5\' 6"  (1.676 m)   Wt 148 lb (67.1 kg)   LMP  (LMP Unknown)   BMI 23.89 kg/m  self reported vitals  Nonlabored voice, normal speech      Assessment and Plan    # Edema S:Both legs started swelling on Monday. No calf pain or tenderness.  She tried compression stockings but simply not helping. No fever/chills/redness in the legs. She is still taking her eliquis which would decrease risk of dvt.  No chest pain but does have some more shortness of breath.   Nurse was out at home today and blood pressure was "fine" in 130s and normal oxygen. [facility]. They walked her with oxygen and levels did not drop.   She was hospitalized most recently late February for sepsis/bacteremia- continued to have cholecystostomy tube fall out- has not had issues since that most recent hospitalization. Patient did not have good rehab  experience. She just finished antibiotics cephalexin today for bacteremia with amoxicillin treating enterococcus faecalis and staphylococcus simulans. Had been treated with cholecystotomy tube due to comorbidities.   Denies recent salty foods increase A/P: 86 year old female with recent history of bacteremia/sepsis treated with amoxicillin now presenting with several days of worsening swelling in her legs.  While hospitalized her hydrochlorothiazide was discontinued and not restarted before discharge.  In addition patient has continued on amlodipine.  I wonder if this could be contributing.   -We will hold amlodipine 5 mg daily and start hydrochlorothiazide 12.5 mg back -I do not think she has a DVT/blood clot based on the fact she is compliant with Eliquis.  - I do wonder about possible heart failure-will get BNP level and if elevated consider echocardiogram. -Ideally this would have been an in person  visit but patient is having difficulty ambulating due to swelling in her legs.  I told her if she is unable to get to an office visit then she needs to go to the hospital likely. -After this discussion she does think she will at least be able to get by the office for labs with some significant assistance from her support network-if she has new or worsening symptoms she should still present to the hospital immediately. -I want labs done tomorrow if at all possible and I would also like to see her in the office next week to see how she is doing in person -No other obvious medication causes of her symptoms  Recommended follow up:  Future Appointments  Date Time Provider [place]  02/11/2020  9:00 AM [facility] LAB [facility]  03/13/2020 10:00 AM [facility]-IR 1 [facility]IR [facility]  04/03/2020 10:40 AM Marin Olp, MD [facility]  12/27/2020 12:45 PM Hayden Pedro, MD T[facility]-TRE None    Lab/Order associations:   ICD-10-CM   1. Edema, unspecified type  R60.9 TSH    CBC with Differential/Platelet     Comprehensive metabolic panel    B Nat Peptide    POCT Urinalysis Dipstick (Automated)  2. DOE (dyspnea on exertion)  R06.00 B Nat Peptide  3. Essential hypertension  I10     Meds ordered this encounter  Medications  . hydrochlorothiazide (HYDRODIURIL) 12.5 MG tablet    Sig: Take 1 tablet (12.5 mg total) by mouth daily.    Dispense:  30 tablet    Refill:  3    Return precautions advised.  Garret Reddish, MD

## 2020-02-10 NOTE — Patient Instructions (Signed)
There are no preventive care reminders to display for this patient. Depression screen [facility] 2/9 10/05/2019 07/14/2019 03/31/2019  Decreased Interest 0 0 0  Down, Depressed, Hopeless 0 0 0  PHQ - 2 Score 0 0 0  Altered sleeping 0 - 0  Tired, decreased energy 3 - 1  Change in appetite 2 - 1  Feeling bad or failure about yourself  0 - 0  Trouble concentrating 0 - 0  Moving slowly or fidgety/restless 0 - 0  Suicidal thoughts 0 - 0  PHQ-9 Score 5 - 2  Difficult doing work/chores Somewhat difficult - Not difficult at all  Some recent data might be hidden

## 2020-02-11 ENCOUNTER — Other Ambulatory Visit (INDEPENDENT_AMBULATORY_CARE_PROVIDER_SITE_OTHER): Payer: Medicare Other

## 2020-02-11 ENCOUNTER — Other Ambulatory Visit: Payer: Self-pay

## 2020-02-11 DIAGNOSIS — R609 Edema, unspecified: Secondary | ICD-10-CM

## 2020-02-11 DIAGNOSIS — N390 Urinary tract infection, site not specified: Secondary | ICD-10-CM | POA: Diagnosis not present

## 2020-02-11 DIAGNOSIS — G2581 Restless legs syndrome: Secondary | ICD-10-CM | POA: Diagnosis not present

## 2020-02-11 DIAGNOSIS — R0600 Dyspnea, unspecified: Secondary | ICD-10-CM | POA: Diagnosis not present

## 2020-02-11 DIAGNOSIS — K810 Acute cholecystitis: Secondary | ICD-10-CM | POA: Diagnosis not present

## 2020-02-11 DIAGNOSIS — N3090 Cystitis, unspecified without hematuria: Secondary | ICD-10-CM | POA: Diagnosis not present

## 2020-02-11 DIAGNOSIS — R0609 Other forms of dyspnea: Secondary | ICD-10-CM

## 2020-02-11 DIAGNOSIS — I4891 Unspecified atrial fibrillation: Secondary | ICD-10-CM | POA: Diagnosis not present

## 2020-02-11 DIAGNOSIS — A419 Sepsis, unspecified organism: Secondary | ICD-10-CM | POA: Diagnosis not present

## 2020-02-11 LAB — CBC WITH DIFFERENTIAL/PLATELET
Basophils Absolute: 0.1 10*3/uL (ref 0.0–0.1)
Basophils Relative: 1.1 % (ref 0.0–3.0)
Eosinophils Absolute: 0.1 10*3/uL (ref 0.0–0.7)
Eosinophils Relative: 2.1 % (ref 0.0–5.0)
HCT: 29.5 % — ABNORMAL LOW (ref 36.0–46.0)
Hemoglobin: 9.5 g/dL — ABNORMAL LOW (ref 12.0–15.0)
Lymphocytes Relative: 23.8 % (ref 12.0–46.0)
Lymphs Abs: 1.7 10*3/uL (ref 0.7–4.0)
MCHC: 32.2 g/dL (ref 30.0–36.0)
MCV: 91 fl (ref 78.0–100.0)
Monocytes Absolute: 0.6 10*3/uL (ref 0.1–1.0)
Monocytes Relative: 8 % (ref 3.0–12.0)
Neutro Abs: 4.6 10*3/uL (ref 1.4–7.7)
Neutrophils Relative %: 65 % (ref 43.0–77.0)
Platelets: 396 10*3/uL (ref 150.0–400.0)
RBC: 3.24 Mil/uL — ABNORMAL LOW (ref 3.87–5.11)
RDW: 14.9 % (ref 11.5–15.5)
WBC: 7 10*3/uL (ref 4.0–10.5)

## 2020-02-11 LAB — POC URINALSYSI DIPSTICK (AUTOMATED)
Bilirubin, UA: POSITIVE
Blood, UA: NEGATIVE
Glucose, UA: NEGATIVE
Ketones, UA: NEGATIVE
Leukocytes, UA: NEGATIVE
Nitrite, UA: NEGATIVE
Protein, UA: POSITIVE — AB
Spec Grav, UA: >=1.03 — AB (ref 1.010–1.025)
Urobilinogen, UA: 0.2 E.U./dL
pH, UA: 5.5 (ref 5.0–8.0)

## 2020-02-11 LAB — TSH: TSH: 1.39 u[IU]/mL (ref 0.35–4.50)

## 2020-02-11 LAB — COMPREHENSIVE METABOLIC PANEL
ALT: 11 U/L (ref 0–35)
AST: 13 U/L (ref 0–37)
Albumin: 3.5 g/dL (ref 3.5–5.2)
Alkaline Phosphatase: 106 U/L (ref 39–117)
BUN: 21 mg/dL (ref 6–23)
CO2: 24 mEq/L (ref 19–32)
Calcium: 8.6 mg/dL (ref 8.4–10.5)
Chloride: 108 mEq/L (ref 96–112)
Creatinine, Ser: 0.74 mg/dL (ref 0.40–1.20)
GFR: 74.33 mL/min (ref >60.00)
Glucose, Bld: 165 mg/dL — ABNORMAL HIGH (ref 70–99)
Potassium: 4.1 mEq/L (ref 3.5–5.1)
Sodium: 140 mEq/L (ref 135–145)
Total Bilirubin: 0.5 mg/dL (ref 0.2–1.2)
Total Protein: 6.5 g/dL (ref 6.0–8.3)

## 2020-02-11 LAB — BRAIN NATRIURETIC PEPTIDE: Pro B Natriuretic peptide (BNP): 228 pg/mL — ABNORMAL HIGH (ref 0.0–100.0)

## 2020-02-15 ENCOUNTER — Ambulatory Visit (INDEPENDENT_AMBULATORY_CARE_PROVIDER_SITE_OTHER): Payer: Medicare Other | Admitting: Family Medicine

## 2020-02-15 ENCOUNTER — Ambulatory Visit (INDEPENDENT_AMBULATORY_CARE_PROVIDER_SITE_OTHER): Payer: Medicare Other

## 2020-02-15 ENCOUNTER — Telehealth: Payer: Self-pay | Admitting: Family Medicine

## 2020-02-15 ENCOUNTER — Encounter: Payer: Self-pay | Admitting: Family Medicine

## 2020-02-15 VITALS — BP 122/80 | HR 67 | Temp 97.6°F | Ht 66.0 in | Wt 159.0 lb

## 2020-02-15 DIAGNOSIS — K5900 Constipation, unspecified: Secondary | ICD-10-CM

## 2020-02-15 DIAGNOSIS — N390 Urinary tract infection, site not specified: Secondary | ICD-10-CM | POA: Diagnosis not present

## 2020-02-15 DIAGNOSIS — R0600 Dyspnea, unspecified: Secondary | ICD-10-CM | POA: Diagnosis not present

## 2020-02-15 DIAGNOSIS — I739 Peripheral vascular disease, unspecified: Secondary | ICD-10-CM

## 2020-02-15 DIAGNOSIS — N3090 Cystitis, unspecified without hematuria: Secondary | ICD-10-CM

## 2020-02-15 DIAGNOSIS — I10 Essential (primary) hypertension: Secondary | ICD-10-CM | POA: Diagnosis not present

## 2020-02-15 DIAGNOSIS — K219 Gastro-esophageal reflux disease without esophagitis: Secondary | ICD-10-CM

## 2020-02-15 DIAGNOSIS — R809 Proteinuria, unspecified: Secondary | ICD-10-CM

## 2020-02-15 DIAGNOSIS — R609 Edema, unspecified: Secondary | ICD-10-CM

## 2020-02-15 DIAGNOSIS — J449 Chronic obstructive pulmonary disease, unspecified: Secondary | ICD-10-CM

## 2020-02-15 DIAGNOSIS — G2581 Restless legs syndrome: Secondary | ICD-10-CM | POA: Diagnosis not present

## 2020-02-15 DIAGNOSIS — K810 Acute cholecystitis: Secondary | ICD-10-CM | POA: Diagnosis not present

## 2020-02-15 DIAGNOSIS — R0602 Shortness of breath: Secondary | ICD-10-CM | POA: Diagnosis not present

## 2020-02-15 DIAGNOSIS — M1991 Primary osteoarthritis, unspecified site: Secondary | ICD-10-CM

## 2020-02-15 DIAGNOSIS — D649 Anemia, unspecified: Secondary | ICD-10-CM

## 2020-02-15 DIAGNOSIS — I4891 Unspecified atrial fibrillation: Secondary | ICD-10-CM

## 2020-02-15 DIAGNOSIS — A419 Sepsis, unspecified organism: Secondary | ICD-10-CM | POA: Diagnosis not present

## 2020-02-15 DIAGNOSIS — J438 Other emphysema: Secondary | ICD-10-CM

## 2020-02-15 DIAGNOSIS — E890 Postprocedural hypothyroidism: Secondary | ICD-10-CM

## 2020-02-15 LAB — POC URINALSYSI DIPSTICK (AUTOMATED)
Bilirubin, UA: NEGATIVE
Blood, UA: NEGATIVE
Glucose, UA: NEGATIVE
Ketones, UA: NEGATIVE
Leukocytes, UA: NEGATIVE
Nitrite, UA: NEGATIVE
Protein, UA: POSITIVE — AB
Spec Grav, UA: 1.02 (ref 1.010–1.025)
Urobilinogen, UA: 0.2 E.U./dL
pH, UA: 5.5 (ref 5.0–8.0)

## 2020-02-15 LAB — COMPREHENSIVE METABOLIC PANEL
ALT: 11 U/L (ref 0–35)
AST: 13 U/L (ref 0–37)
Albumin: 3.7 g/dL (ref 3.5–5.2)
Alkaline Phosphatase: 115 U/L (ref 39–117)
BUN: 15 mg/dL (ref 6–23)
CO2: 25 mEq/L (ref 19–32)
Calcium: 9 mg/dL (ref 8.4–10.5)
Chloride: 106 mEq/L (ref 96–112)
Creatinine, Ser: 0.62 mg/dL (ref 0.40–1.20)
GFR: 91.17 mL/min (ref >60.00)
Glucose, Bld: 112 mg/dL — ABNORMAL HIGH (ref 70–99)
Potassium: 4 mEq/L (ref 3.5–5.1)
Sodium: 141 mEq/L (ref 135–145)
Total Bilirubin: 0.6 mg/dL (ref 0.2–1.2)
Total Protein: 6.9 g/dL (ref 6.0–8.3)

## 2020-02-15 LAB — CBC WITH DIFFERENTIAL/PLATELET
Basophils Absolute: 0.1 10*3/uL (ref 0.0–0.1)
Basophils Relative: 1.3 % (ref 0.0–3.0)
Eosinophils Absolute: 0.2 10*3/uL (ref 0.0–0.7)
Eosinophils Relative: 3.7 % (ref 0.0–5.0)
HCT: 30 % — ABNORMAL LOW (ref 36.0–46.0)
Hemoglobin: 9.8 g/dL — ABNORMAL LOW (ref 12.0–15.0)
Lymphocytes Relative: 24.3 % (ref 12.0–46.0)
Lymphs Abs: 1.5 10*3/uL (ref 0.7–4.0)
MCHC: 32.8 g/dL (ref 30.0–36.0)
MCV: 89.5 fl (ref 78.0–100.0)
Monocytes Absolute: 0.6 10*3/uL (ref 0.1–1.0)
Monocytes Relative: 9.4 % (ref 3.0–12.0)
Neutro Abs: 3.7 10*3/uL (ref 1.4–7.7)
Neutrophils Relative %: 61.3 % (ref 43.0–77.0)
Platelets: 348 10*3/uL (ref 150.0–400.0)
RBC: 3.35 Mil/uL — ABNORMAL LOW (ref 3.87–5.11)
RDW: 14.5 % (ref 11.5–15.5)
WBC: 6 10*3/uL (ref 4.0–10.5)

## 2020-02-15 LAB — URINALYSIS, MICROSCOPIC ONLY

## 2020-02-15 LAB — MICROALBUMIN / CREATININE URINE RATIO
Creatinine,U: 109.1 mg/dL
Microalb Creat Ratio: 5.1 mg/g (ref 0.0–30.0)
Microalb, Ur: 5.5 mg/dL — ABNORMAL HIGH (ref 0.0–1.9)

## 2020-02-15 MED ORDER — ATORVASTATIN CALCIUM 40 MG PO TABS: 40.0000 mg | ORAL_TABLET | Freq: Every day | ORAL | 3 refills | Status: DC

## 2020-02-15 MED ORDER — IRBESARTAN 300 MG PO TABS: 300.0000 mg | ORAL_TABLET | Freq: Every day | ORAL | 3 refills | Status: DC

## 2020-02-15 MED ORDER — TRAZODONE HCL 50 MG PO TABS: 25.0000 mg | ORAL_TABLET | Freq: Every evening | ORAL | 1 refills | Status: DC | PRN

## 2020-02-15 MED ORDER — APIXABAN 5 MG PO TABS: 5.0000 mg | ORAL_TABLET | Freq: Two times a day (BID) | ORAL | 11 refills | Status: DC

## 2020-02-15 NOTE — Telephone Encounter (Signed)
See below

## 2020-02-15 NOTE — Progress Notes (Signed)
Notes from Dr. Durene Cal It does appear you have some fluid on your lungs-I think the Lasix will be helpful with your swelling in your legs and to help you feel better

## 2020-02-15 NOTE — Telephone Encounter (Signed)
Patient wanted to know if Dr. Durene Cal would prescribe a sleeping medication, she forgot to let him know that she having trouble sleeping and would likes something to help with it. Please advise.

## 2020-02-15 NOTE — Telephone Encounter (Signed)
I sent in trazodone for her to try-I want her to start with just 1/2 tablet.  Try to avoid taking tramadol within 6 hours of the trazodone.  Please also make sure that her trouble sleeping is not related to shortness of breath laying down-that could be a sign of heart failure if she is having issues with that-may need to sleep slightly propped up if that is the case we can continue to try to get the fluid off

## 2020-02-15 NOTE — Telephone Encounter (Signed)
Called patient reviewed all information. She does not think it is due to breathing she can fall asleep but will wake up in two hours and can not go back to sleep. She will try 1/2 tab and not take with in 6 hrs of tramadol. Will call if any questions.

## 2020-02-15 NOTE — Progress Notes (Addendum)
Phone [phone] In person visit   Subjective:   Cheryl Atkinson is a 86 y.o. year old very pleasant female patient who presents for/with See problem oriented charting Chief Complaint  Patient presents with  . Edema   This visit occurred during the SARS-CoV-2 public health emergency.  Safety protocols were in place, including screening questions prior to the visit, additional usage of staff PPE, and extensive cleaning of exam room while observing appropriate contact time as indicated for disinfecting solutions.   Past Medical History-  Patient Active Problem List   Diagnosis Date Noted  . Memory loss 04/11/2016    Priority: High  . Renal artery stenosis (HCC) 09/27/2015    Priority: High  . Claudication (HCC) 05/04/2015    Priority: High  . Atherosclerotic PVD with intermittent claudication (HCC) 04/26/2015    Priority: High  . DNR (do not resuscitate) 09/29/2014    Priority: High  . Insulin resistance 07/19/2014    Priority: High  . LOW BACK PAIN 09/25/2007    Priority: High  . Major depression 03/25/2018    Priority: Medium  . BPPV (benign paroxysmal positional vertigo) 07/12/2016    Priority: Medium  . Hyperlipidemia 06/30/2015    Priority: Medium  . Former smoker 09/29/2014    Priority: Medium  . COPD (chronic obstructive pulmonary disease) (HCC) 09/20/2009    Priority: Medium  . Iron deficiency anemia 11/09/2007    Priority: Medium  . RESTLESS LEG SYNDROME 09/25/2007    Priority: Medium  . Essential hypertension 09/25/2007    Priority: Medium  . GERD 09/25/2007    Priority: Medium  . Osteoporosis 09/25/2007    Priority: Medium  . Cat bite of right hand 12/21/2016    Priority: Low  . Mallet toe of right foot 10/20/2015    Priority: Low  . Tinnitus 12/31/2014    Priority: Low  . Multinodular goiter 08/30/2013    Priority: Low  . Spinal stenosis of lumbar region at multiple levels 09/29/2012    Priority: Low  . HIP PAIN, BILATERAL 07/16/2010   Priority: Low  . CONSTIPATION, CHRONIC 09/20/2009    Priority: Low  . History of UTI 11/09/2007    Priority: Low  . Bacteremia 01/28/2020  . Sepsis (HCC) 01/25/2020  . Acute cholangitis 01/25/2020  . Atrial fibrillation (HCC) 01/25/2020  . Acute cholecystitis 12/29/2019  . UTI (urinary tract infection) 12/29/2019  . Adrenal nodule (HCC) 12/29/2019  . Abdominal aortic ectasia (HCC) 12/29/2019  . Hypokalemia 12/21/2016    Medications- reviewed and updated Current Outpatient Medications  Medication Sig Dispense Refill  . acetaminophen (TYLENOL) 325 MG tablet Take 2 tablets (650 mg total) by mouth every 6 (six) hours as needed for mild pain, moderate pain or fever (or Fever >/= 101).    Marland Kitchen atorvastatin (LIPITOR) 40 MG tablet Take 1 tablet (40 mg total) by mouth daily. 90 tablet 3  . clopidogrel (PLAVIX) 75 MG tablet Take 1 tablet (75 mg total) by mouth daily. 90 tablet 1  . irbesartan (AVAPRO) 300 MG tablet Take 1 tablet (300 mg total) by mouth daily. 90 tablet 3  . Multiple Vitamins-Minerals (PRESERVISION AREDS 2 PO) Take 1 tablet by mouth 2 (two) times daily.    . polyethylene glycol (MIRALAX / GLYCOLAX) 17 g packet Take 17 g by mouth 2 (two) times daily. Reported on 03/14/2016    . Rotigotine 3 MG/24HR PT24 Place 3 mg onto the skin at bedtime. 90 patch 3  . sodium chloride flush (NS) 0.9 % SOLN  5 mLs by Intracatheter route every 8 (eight) hours.    . Albuterol Sulfate (PROAIR RESPICLICK) 656 (90 Base) MCG/ACT AEPB Inhale 2 puffs into the lungs every 6 (six) hours as needed (shortness of breath from COPD). (Patient not taking: Reported on 02/15/2020) 1 each 5  . apixaban (ELIQUIS) 5 MG TABS tablet Take 1 tablet (5 mg total) by mouth 2 (two) times daily. 60 tablet 11  . hydrochlorothiazide (HYDRODIURIL) 12.5 MG tablet Take 1 tablet (12.5 mg total) by mouth daily. (Patient not taking: Reported on 02/15/2020) 30 tablet 3  . metFORMIN (GLUCOPHAGE) 500 MG tablet TAKE 1 (1) TABLET BY MOUTH DAILY  (Patient not taking: Reported on 02/15/2020) 90 tablet 3  . traMADol (ULTRAM) 50 MG tablet Take 50 mg by mouth as needed.     No current facility-administered medications for this visit.     Objective:  BP 122/80   Pulse 67   Temp 97.6 F (36.4 C) (Oral)   Ht 5\' 6"  (1.676 m)   Wt 159 lb (72.1 kg)   LMP  (LMP Unknown)   BMI 25.66 kg/m  Gen: NAD, resting comfortably CV: Irregularly irregular no murmurs rubs or gallops Lungs: CTAB no crackles, wheeze, rhonchi Ext: 2+ edema- no longer fits in regular shoes Skin: warm, dry Neuro: wheelchair bound    Assessment and Plan  # Edema  #Dyspnea #Hypertension S:At last visit patient instructed to hold amlodipine 5 mg daily (we reviewed meds and she is actually still taking amlodipine) and start hydrochlorothiazide 12.5 mg back. Patient mentioned that since she was here last week that she is unable to wear tennis shoes. She has to wear bedroom shoes if she goes out.   Primary findings from blood work for edema last week were 1+ protein in urine, anemia but stable,, normal TSH, mildly elevated BNP but not when factoring in age  Patient has noted more shorntess of breath with continued swelling. Weigh tis up 14 lbs from last November on our scales.  Albuterol only helps short-term with shortness of breath  Not urinating much- seems to just be drops and only going a few times a day.   A/P: 86 year old female with continued issues with edema-unfortunately she did not stop amlodipine at last visit and I once again encouraged her to stop this. -Blood pressure appears well controlled on amlodipine 5 mg, irbesartan 30 mg, hydrochlorothiazide 12.5 mg. -We will make the following alterations-stopping amlodipine as above, holding hydrochlorothiazide in its place trying Lasix 20 mg daily for the next 6 to 7 days -Close follow-up next week -With ongoing swelling in the legs and slightly elevated BNP last visit (though for her age would really expect BNP  to be over 900 with heart failure) we opted to update echocardiogram -Also check a chest x-ray given shortness of breath to assess for obvious fluid overload -We will try to work-up proteinuria more-urine microscopic to look for sediment and microalbumin to creatinine ratio to quantify further -For shortness of breath no obvious COPD exacerbation though albuterol does help slightly with shortness of breath -If she has new or worsening symptoms to let us know sooner  #Surgery follow up april as she still has cholecystotomy tube in place  #Generalized weakness-PT coming out to patient's house and encouraged her to continue to work with them  Recommended follow up: 1 week follow-up Future Appointments  Date Time Provider [place]  02/22/2020 10:00 AM Marin Olp, MD [facility]  03/02/2020 10:30 AM MC-CV [facility] ECHO 4  [id number]  03/13/2020 10:00 AM MC-IR 1 MC[facility]IR [facility]  04/03/2020 10:40 AM Shelva Majestic, MD [facility]  12/27/2020 12:45 PM Sherrie George, MD TRE-TRE None    Lab/Order associations:   ICD-10-CM   1. Edema, unspecified type  R60.9   2. Proteinuria, unspecified type  R80.9 POCT Urinalysis Dipstick (Automated)    Urine Microscopic    Microalbumin / creatinine urine ratio  3. Dyspnea, unspecified type  R06.00 ECHOCARDIOGRAM COMPLETE    DG Chest 2 View    ECHOCARDIOGRAM COMPLETE  4. Essential hypertension  I10 CBC with Differential/Platelet    Comprehensive metabolic panel  5. Other emphysema (HCC)  J43.8    Time Spent: 34 minutes of total time (10:12 AM- 10:37 AM, 12:35-12:44 PM) was spent on the date of the encounter performing the following actions: chart review prior to seeing the patient, obtaining history, performing a medically necessary exam, counseling on the treatment plan, placing orders, and documenting in our EHR.   Return precautions advised.  Tana Conch, MD

## 2020-02-15 NOTE — Patient Instructions (Addendum)
STOP Taking amlodipine  Hold hydrochlorothiazide 12.5mg  for now. I am going to try lasix 20mg  daily for the next week. Schedule in one of the same day slots for next week before you go  Check at home and make sure you are on clopidogrel/plavix from Dr. - if not it should be available at your pharmacy- if not call Dr. Allyson Sabal. You should not be on aspirin per last notes.   Please stop by lab and x-ray before you go If you do not have mychart- we will call you about results within 5 business days of Allyson Sabal receiving them.  If you have mychart- we will send your results within 3 business days of [place] receiving them.  If abnormal or we want to clarify a result, we will call or mychart you to make sure you receive the message.  If you have questions or concerns or don't hear within 5 business days, please send [place] a message or call us.   We will call you within two weeks about your referral for echocardiogram. If you do not hear within 3 weeks, give [place] a call.   Recommended follow up: next week

## 2020-02-16 ENCOUNTER — Other Ambulatory Visit: Payer: Self-pay

## 2020-02-16 MED ORDER — FUROSEMIDE 20 MG PO TABS: 20.0000 mg | ORAL_TABLET | Freq: Every day | ORAL | 0 refills | Status: DC

## 2020-02-17 DIAGNOSIS — N390 Urinary tract infection, site not specified: Secondary | ICD-10-CM | POA: Diagnosis not present

## 2020-02-17 DIAGNOSIS — G2581 Restless legs syndrome: Secondary | ICD-10-CM | POA: Diagnosis not present

## 2020-02-17 DIAGNOSIS — K810 Acute cholecystitis: Secondary | ICD-10-CM | POA: Diagnosis not present

## 2020-02-17 DIAGNOSIS — I4891 Unspecified atrial fibrillation: Secondary | ICD-10-CM | POA: Diagnosis not present

## 2020-02-17 DIAGNOSIS — A419 Sepsis, unspecified organism: Secondary | ICD-10-CM | POA: Diagnosis not present

## 2020-02-17 DIAGNOSIS — N3090 Cystitis, unspecified without hematuria: Secondary | ICD-10-CM | POA: Diagnosis not present

## 2020-02-21 ENCOUNTER — Telehealth: Payer: Self-pay | Admitting: Family Medicine

## 2020-02-21 DIAGNOSIS — N3090 Cystitis, unspecified without hematuria: Secondary | ICD-10-CM | POA: Diagnosis not present

## 2020-02-21 DIAGNOSIS — A419 Sepsis, unspecified organism: Secondary | ICD-10-CM | POA: Diagnosis not present

## 2020-02-21 DIAGNOSIS — K810 Acute cholecystitis: Secondary | ICD-10-CM | POA: Diagnosis not present

## 2020-02-21 DIAGNOSIS — G2581 Restless legs syndrome: Secondary | ICD-10-CM | POA: Diagnosis not present

## 2020-02-21 DIAGNOSIS — I4891 Unspecified atrial fibrillation: Secondary | ICD-10-CM | POA: Diagnosis not present

## 2020-02-21 DIAGNOSIS — N390 Urinary tract infection, site not specified: Secondary | ICD-10-CM | POA: Diagnosis not present

## 2020-02-21 NOTE — Telephone Encounter (Signed)
Cheryl Atkinson is calling in asking for verbal orders to continue home health PT starting next week, twice a week for 3 weeks.

## 2020-02-21 NOTE — Telephone Encounter (Signed)
Called and lm for pt tcb. 

## 2020-02-22 ENCOUNTER — Encounter: Payer: Self-pay | Admitting: Family Medicine

## 2020-02-22 ENCOUNTER — Other Ambulatory Visit: Payer: Self-pay

## 2020-02-22 ENCOUNTER — Ambulatory Visit (INDEPENDENT_AMBULATORY_CARE_PROVIDER_SITE_OTHER): Payer: Medicare Other | Admitting: Family Medicine

## 2020-02-22 VITALS — BP 128/76 | HR 65 | Temp 97.6°F | Ht 66.0 in | Wt 146.0 lb

## 2020-02-22 DIAGNOSIS — R609 Edema, unspecified: Secondary | ICD-10-CM

## 2020-02-22 DIAGNOSIS — E8881 Metabolic syndrome: Secondary | ICD-10-CM

## 2020-02-22 DIAGNOSIS — R319 Hematuria, unspecified: Secondary | ICD-10-CM | POA: Diagnosis not present

## 2020-02-22 DIAGNOSIS — I10 Essential (primary) hypertension: Secondary | ICD-10-CM | POA: Diagnosis not present

## 2020-02-22 LAB — URINALYSIS, MICROSCOPIC ONLY

## 2020-02-22 LAB — COMPREHENSIVE METABOLIC PANEL
ALT: 10 U/L (ref 0–35)
AST: 14 U/L (ref 0–37)
Albumin: 4.2 g/dL (ref 3.5–5.2)
Alkaline Phosphatase: 122 U/L — ABNORMAL HIGH (ref 39–117)
BUN: 16 mg/dL (ref 6–23)
CO2: 31 mEq/L (ref 19–32)
Calcium: 9.3 mg/dL (ref 8.4–10.5)
Chloride: 99 mEq/L (ref 96–112)
Creatinine, Ser: 0.71 mg/dL (ref 0.40–1.20)
GFR: 77.96 mL/min (ref >60.00)
Glucose, Bld: 92 mg/dL (ref 70–99)
Potassium: 3.2 mEq/L — ABNORMAL LOW (ref 3.5–5.1)
Sodium: 139 mEq/L (ref 135–145)
Total Bilirubin: 0.7 mg/dL (ref 0.2–1.2)
Total Protein: 7.4 g/dL (ref 6.0–8.3)

## 2020-02-22 MED ORDER — POTASSIUM CHLORIDE CRYS ER 20 MEQ PO TBCR: 20.0000 meq | EXTENDED_RELEASE_TABLET | Freq: Every day | ORAL | 3 refills | Status: DC | PRN

## 2020-02-22 MED ORDER — TRAZODONE HCL 50 MG PO TABS: 75.0000 mg | ORAL_TABLET | Freq: Every evening | ORAL | 1 refills | Status: DC | PRN

## 2020-02-22 MED ORDER — FUROSEMIDE 40 MG PO TABS: 40.0000 mg | ORAL_TABLET | Freq: Every day | ORAL | 3 refills | Status: DC

## 2020-02-22 NOTE — Assessment & Plan Note (Signed)
#  Insulin resistance/hyperglycemia S: Patient previously compliant with metformin 500 milligrams daily.Off metformin since January- looks like stopped after hospitalization or held but never restarted  Lab Results  Component Value Date   HGBA1C 5.9 (H) 01/01/2020   HGBA1C 6.1 10/05/2019   HGBA1C 6.1 01/08/2019  A/P: Patient would like to stay off Metformin at this time and we can recheck A1c at next visit-if numbers are trending up we may restart medication

## 2020-02-22 NOTE — Progress Notes (Signed)
Phone [phone] In person visit   Subjective:   Cheryl Atkinson is a 86 y.o. year old very pleasant female patient who presents for/with See problem oriented charting Chief Complaint  Patient presents with  . Edema   This visit occurred during the SARS-CoV-2 public health emergency.  Safety protocols were in place, including screening questions prior to the visit, additional usage of staff PPE, and extensive cleaning of exam room while observing appropriate contact time as indicated for disinfecting solutions.   Past Medical History-  Patient Active Problem List   Diagnosis Date Noted  . Bacteremia 01/28/2020    Priority: High  . Atrial fibrillation (HCC) 01/25/2020    Priority: High  . Memory loss 04/11/2016    Priority: High  . Renal artery stenosis (HCC) 09/27/2015    Priority: High  . Claudication (HCC) 05/04/2015    Priority: High  . Atherosclerotic PVD with intermittent claudication (HCC) 04/26/2015    Priority: High  . DNR (do not resuscitate) 09/29/2014    Priority: High  . Insulin resistance 07/19/2014    Priority: High  . LOW BACK PAIN 09/25/2007    Priority: High  . Major depression 03/25/2018    Priority: Medium  . BPPV (benign paroxysmal positional vertigo) 07/12/2016    Priority: Medium  . Hyperlipidemia 06/30/2015    Priority: Medium  . Former smoker 09/29/2014    Priority: Medium  . COPD (chronic obstructive pulmonary disease) (HCC) 09/20/2009    Priority: Medium  . Iron deficiency anemia 11/09/2007    Priority: Medium  . RESTLESS LEG SYNDROME 09/25/2007    Priority: Medium  . Essential hypertension 09/25/2007    Priority: Medium  . GERD 09/25/2007    Priority: Medium  . Osteoporosis 09/25/2007    Priority: Medium  . Abdominal aortic ectasia (HCC) 12/29/2019    Priority: Low  . Cat bite of right hand 12/21/2016    Priority: Low  . Mallet toe of right foot 10/20/2015    Priority: Low  . Tinnitus 12/31/2014    Priority: Low  .  Multinodular goiter 08/30/2013    Priority: Low  . Spinal stenosis of lumbar region at multiple levels 09/29/2012    Priority: Low  . HIP PAIN, BILATERAL 07/16/2010    Priority: Low  . CONSTIPATION, CHRONIC 09/20/2009    Priority: Low  . History of UTI 11/09/2007    Priority: Low  . Acute cholangitis 01/25/2020  . Acute cholecystitis 12/29/2019  . Adrenal nodule (HCC) 12/29/2019  . Hypokalemia 12/21/2016    Medications- reviewed and updated Current Outpatient Medications  Medication Sig Dispense Refill  . Albuterol Sulfate (PROAIR RESPICLICK) 108 (90 Base) MCG/ACT AEPB Inhale 2 puffs into the lungs every 6 (six) hours as needed (shortness of breath from COPD). 1 each 5  . apixaban (ELIQUIS) 5 MG TABS tablet Take 1 tablet (5 mg total) by mouth 2 (two) times daily. 60 tablet 11  . atorvastatin (LIPITOR) 40 MG tablet Take 1 tablet (40 mg total) by mouth daily. 90 tablet 3  . clopidogrel (PLAVIX) 75 MG tablet Take 1 tablet (75 mg total) by mouth daily. 90 tablet 1  . irbesartan (AVAPRO) 300 MG tablet Take 1 tablet (300 mg total) by mouth daily. 90 tablet 3  . Multiple Vitamins-Minerals (PRESERVISION AREDS 2 PO) Take 1 tablet by mouth 2 (two) times daily.    . polyethylene glycol (MIRALAX / GLYCOLAX) 17 g packet Take 17 g by mouth 2 (two) times daily. Reported on 03/14/2016    .  Rotigotine 3 MG/24HR PT24 Place 3 mg onto the skin at bedtime. 90 patch 3  . sodium chloride flush (NS) 0.9 % SOLN 5 mLs by Intracatheter route every 8 (eight) hours.    . traMADol (ULTRAM) 50 MG tablet Take 50 mg by mouth as needed.    . traZODone (DESYREL) 50 MG tablet Take 1.5 tablets (75 mg total) by mouth at bedtime as needed for sleep. 45 tablet 1  . furosemide (LASIX) 40 MG tablet Take 1 tablet (40 mg total) by mouth daily. 30 tablet 3   No current facility-administered medications for this visit.     Objective:  BP 128/76   Pulse 65   Temp 97.6 F (36.4 C) (Temporal)   Ht 5\' 6"  (1.676 m)   Wt 146 lb  (66.2 kg)   LMP  (LMP Unknown)   SpO2 98%   BMI 23.57 kg/m  Gen: NAD, resting comfortably CV: RRR no murmurs rubs or gallops Lungs: CTAB no crackles, wheeze, rhonchi Ext: 2+ pitting edema, some bruising on top of left foot Skin: warm, dry    Assessment and Plan    #Hypertension/edema/suspected CHF S: Patient did have signs of fluid overload on last chest x-ray.  Upcoming echocardiogram in early April  Compliant with irbesartan 300 mg and Lasix 20 mg.  Patient has lost 13 pounds since last visit on this regimen.  She notes significant improvements in shortness of breath.  She still has significant edema in her legs and feels like having a hard time getting her shoes on-wearing slippers as a result.  -off amlodiine march 2021 with edema issues.-Finally stopped this after last visit A/P: Blood pressure is well controlled on current regimen.  Edema is largely stable but has already had improvement in shortness of breath-suspect underlying CHF-patient would like to be more aggressive about Lasix so we increased her to 40 mg and we will follow-up next week.  Update CMP (prior sepsis with cholecystotomy tube) today and likely next week at least get BMP   #Major depression #Insomnia S: Patient has denied counseling and medications in the past.  Seems to have been helped with staying more engaged with friends and family.  Insomnia has still been an issue though-Sleep issues. Sleeps 2-3 hours per her report well and then wakes up and hard to go back to bed. Falls asleep at 8-9 PM. Wakes up around 2 AM.  So actually sounds like 5 or 6 hours of sleep A/P: PHQ-9 remains under 5-remains in full remission-continue without medication -We will increase trazodone to 75 mg-she may try 50 mg at bedtime and 25 mg if she wakes up as well-also on tramadol but uses this sparingly so I think the risk of serotonin syndrome is low   #Insulin resistance/hyperglycemia S: Patient previously compliant with metformin  500 milligrams daily.Off metformin since January- looks like stopped after hospitalization or held but never restarted  Lab Results  Component Value Date   HGBA1C 5.9 (H) 01/01/2020   HGBA1C 6.1 10/05/2019   HGBA1C 6.1 01/08/2019  A/P: Patient would like to stay off Metformin at this time and we can recheck A1c at next visit-if numbers are trending up we may restart medication  Recommended follow up: 1 week Future Appointments  Date Time Provider [place]  03/02/2020 10:30 AM [facility] ECHO 4 [facility]  03/13/2020 10:00 AM MC-IR 1 [facility]  04/03/2020 10:40 AM Marin Olp, MD [facility]  12/27/2020 12:45 PM Hayden Pedro, MD [facility] None  Lab/Order associations:   ICD-10-CM   1. Hematuria, unspecified type  R31.9 Urine Microscopic  2. Essential hypertension  I10 Comprehensive metabolic panel  3. Insulin resistance  E88.81   4. Edema, unspecified type  R60.9     Meds ordered this encounter  Medications  . furosemide (LASIX) 40 MG tablet    Sig: Take 1 tablet (40 mg total) by mouth daily.    Dispense:  30 tablet    Refill:  3  . traZODone (DESYREL) 50 MG tablet    Sig: Take 1.5 tablets (75 mg total) by mouth at bedtime as needed for sleep.    Dispense:  45 tablet    Refill:  1    Time Spent: 32 minutes of total time (10:26 AM- 10:58 AM) was spent on the date of the encounter performing the following actions: chart review prior to seeing the patient, obtaining history, performing a medically necessary exam, counseling on the treatment plan, placing orders, and documenting in our EHR.   Return precautions advised.  Tana Conch, MD

## 2020-02-22 NOTE — Patient Instructions (Addendum)
Please stop by lab before you go If you do not have mychart- we will call you about results within 5 business days of [place] receiving them.  If you have mychart- we will send your results within 3 business days of [place] receiving them.  If abnormal or we want to clarify a result, we will call or mychart you to make sure you receive the message.  If you have questions or concerns or don't hear within 5 business days, please send [place] a message or call us.   Increase lasix to 40mg   Increase trazodone to 1.5 tablets before bed. Or you can try 1 at bedtime and 1/2 tablet if you wake up at 2 AM  Recommended follow up: schedule 10 AM visit in same day slot for next Wednesday the 31st- hopefully we can start to stretch out your visits again if things continue to improve

## 2020-02-22 NOTE — Addendum Note (Signed)
Addended by: Shelva Majestic on: 02/22/2020 09:06 PM   Modules accepted: Orders

## 2020-02-22 NOTE — Assessment & Plan Note (Signed)
#  Hypertension/edema/suspected CHF S: Patient did have signs of fluid overload on last chest x-ray.  Upcoming echocardiogram in early April  Compliant with irbesartan 300 mg and Lasix 20 mg.  Patient has lost 13 pounds since last visit on this regimen.  Cheryl Atkinson notes significant improvements in shortness of breath.  Cheryl Atkinson still has significant edema in her legs and feels like having a hard time getting her shoes on-wearing slippers as a result.  -off amlodiine march 2021 with edema issues.-Finally stopped this after last visit A/P: Blood pressure is well controlled on current regimen.  Edema is largely stable but has already had improvement in shortness of breath-suspect underlying CHF-patient would like to be more aggressive about Lasix so we increased her to 40 mg and we will follow-up next week.  Update CMP (prior sepsis with cholecystotomy tube) today and likely next week at least get BMP

## 2020-02-23 DIAGNOSIS — I4891 Unspecified atrial fibrillation: Secondary | ICD-10-CM | POA: Diagnosis not present

## 2020-02-23 DIAGNOSIS — N3090 Cystitis, unspecified without hematuria: Secondary | ICD-10-CM | POA: Diagnosis not present

## 2020-02-23 DIAGNOSIS — A419 Sepsis, unspecified organism: Secondary | ICD-10-CM | POA: Diagnosis not present

## 2020-02-23 DIAGNOSIS — K810 Acute cholecystitis: Secondary | ICD-10-CM | POA: Diagnosis not present

## 2020-02-23 DIAGNOSIS — G2581 Restless legs syndrome: Secondary | ICD-10-CM | POA: Diagnosis not present

## 2020-02-23 DIAGNOSIS — N390 Urinary tract infection, site not specified: Secondary | ICD-10-CM | POA: Diagnosis not present

## 2020-02-24 ENCOUNTER — Other Ambulatory Visit: Payer: Medicare Other

## 2020-02-24 DIAGNOSIS — K810 Acute cholecystitis: Secondary | ICD-10-CM | POA: Diagnosis not present

## 2020-02-24 DIAGNOSIS — N390 Urinary tract infection, site not specified: Secondary | ICD-10-CM | POA: Diagnosis not present

## 2020-02-24 DIAGNOSIS — I4891 Unspecified atrial fibrillation: Secondary | ICD-10-CM | POA: Diagnosis not present

## 2020-02-24 DIAGNOSIS — A419 Sepsis, unspecified organism: Secondary | ICD-10-CM | POA: Diagnosis not present

## 2020-02-24 DIAGNOSIS — N3090 Cystitis, unspecified without hematuria: Secondary | ICD-10-CM | POA: Diagnosis not present

## 2020-02-24 DIAGNOSIS — G2581 Restless legs syndrome: Secondary | ICD-10-CM | POA: Diagnosis not present

## 2020-02-25 ENCOUNTER — Telehealth: Payer: Self-pay | Admitting: Family Medicine

## 2020-02-25 MED ORDER — IRBESARTAN 300 MG PO TABS: 300.0000 mg | ORAL_TABLET | Freq: Every day | ORAL | 3 refills | Status: DC

## 2020-02-25 NOTE — Telephone Encounter (Signed)
Refill has been sent to the patient's pharmacy.  

## 2020-02-25 NOTE — Telephone Encounter (Signed)
MEDICATION: plavix 75 MG, irbesartan 300 MG  PHARMACY: [facility] [phone] - [address]  Comments:   **Let patient know to contact pharmacy at the end of the day to make sure medication is ready. **  ** Please notify patient to allow 48-72 hours to process**  **Encourage patient to contact the pharmacy for refills or they can request refills through [facility]**

## 2020-02-29 DIAGNOSIS — K810 Acute cholecystitis: Secondary | ICD-10-CM | POA: Diagnosis not present

## 2020-02-29 DIAGNOSIS — G2581 Restless legs syndrome: Secondary | ICD-10-CM | POA: Diagnosis not present

## 2020-02-29 DIAGNOSIS — I4891 Unspecified atrial fibrillation: Secondary | ICD-10-CM | POA: Diagnosis not present

## 2020-02-29 DIAGNOSIS — A419 Sepsis, unspecified organism: Secondary | ICD-10-CM | POA: Diagnosis not present

## 2020-02-29 DIAGNOSIS — N390 Urinary tract infection, site not specified: Secondary | ICD-10-CM | POA: Diagnosis not present

## 2020-02-29 DIAGNOSIS — N3090 Cystitis, unspecified without hematuria: Secondary | ICD-10-CM | POA: Diagnosis not present

## 2020-03-01 ENCOUNTER — Other Ambulatory Visit: Payer: Self-pay

## 2020-03-01 ENCOUNTER — Encounter: Payer: Self-pay | Admitting: Family Medicine

## 2020-03-01 ENCOUNTER — Ambulatory Visit (INDEPENDENT_AMBULATORY_CARE_PROVIDER_SITE_OTHER): Payer: Medicare Other | Admitting: Family Medicine

## 2020-03-01 VITALS — BP 126/80 | HR 59 | Temp 98.0°F | Ht 66.0 in | Wt 141.6 lb

## 2020-03-01 DIAGNOSIS — G4700 Insomnia, unspecified: Secondary | ICD-10-CM | POA: Diagnosis not present

## 2020-03-01 DIAGNOSIS — I509 Heart failure, unspecified: Secondary | ICD-10-CM | POA: Diagnosis not present

## 2020-03-01 DIAGNOSIS — D649 Anemia, unspecified: Secondary | ICD-10-CM

## 2020-03-01 DIAGNOSIS — I10 Essential (primary) hypertension: Secondary | ICD-10-CM

## 2020-03-01 DIAGNOSIS — E876 Hypokalemia: Secondary | ICD-10-CM | POA: Diagnosis not present

## 2020-03-01 DIAGNOSIS — I5030 Unspecified diastolic (congestive) heart failure: Secondary | ICD-10-CM | POA: Insufficient documentation

## 2020-03-01 DIAGNOSIS — F3342 Major depressive disorder, recurrent, in full remission: Secondary | ICD-10-CM | POA: Insufficient documentation

## 2020-03-01 LAB — CBC
HCT: 32.3 % — ABNORMAL LOW (ref 36.0–46.0)
Hemoglobin: 10.7 g/dL — ABNORMAL LOW (ref 12.0–15.0)
MCHC: 33.1 g/dL (ref 30.0–36.0)
MCV: 87 fl (ref 78.0–100.0)
Platelets: 274 10*3/uL (ref 150.0–400.0)
RBC: 3.71 Mil/uL — ABNORMAL LOW (ref 3.87–5.11)
RDW: 13.7 % (ref 11.5–15.5)
WBC: 6.8 10*3/uL (ref 4.0–10.5)

## 2020-03-01 LAB — BASIC METABOLIC PANEL
BUN: 43 mg/dL — ABNORMAL HIGH (ref 6–23)
CO2: 29 mEq/L (ref 19–32)
Calcium: 9.3 mg/dL (ref 8.4–10.5)
Chloride: 99 mEq/L (ref 96–112)
Creatinine, Ser: 0.95 mg/dL (ref 0.40–1.20)
GFR: 55.71 mL/min — ABNORMAL LOW (ref >60.00)
Glucose, Bld: 85 mg/dL (ref 70–99)
Potassium: 3.8 mEq/L (ref 3.5–5.1)
Sodium: 138 mEq/L (ref 135–145)

## 2020-03-01 NOTE — Assessment & Plan Note (Signed)
Trazodone 75 mg-takes 50 mg at bedtime and 25 mg if she wakes up.  Very effective-continue current medication

## 2020-03-01 NOTE — Assessment & Plan Note (Signed)
Clinically patient with CHF-with significant improvement in fatigue/shortness of breath/edema and now down 18 pounds over several weeks on Lasix-now on 40 mg with much better response than 20 mg.  Pending echocardiogram as of tomorrow.  Will check electrolytes-has been started on potassium  Blood pressure is also well controlled-we will continue current medications  2-week follow-up planned and sooner if needed-may be able to go back down to 20 mg Lasix at follow-up but I am considering continuing this long-term instead of hydrochlorothiazide

## 2020-03-01 NOTE — Progress Notes (Signed)
Phone [phone] In person visit   Subjective:   Cheryl Atkinson is a 86 y.o. year old very pleasant female patient who presents for/with See problem oriented charting Chief Complaint  Patient presents with  . Edema    This visit occurred during the SARS-CoV-2 public health emergency.  Safety protocols were in place, including screening questions prior to the visit, additional usage of staff PPE, and extensive cleaning of exam room while observing appropriate contact time as indicated for disinfecting solutions.   Past Medical History-  Patient Active Problem List   Diagnosis Date Noted  . CHF (congestive heart failure) ([place]) 03/01/2020    Priority: High  . Bacteremia 01/28/2020    Priority: High  . Atrial fibrillation ([place]) 01/25/2020    Priority: High  . Memory loss 04/11/2016    Priority: High  . Renal artery stenosis ([place]) 09/27/2015    Priority: High  . Claudication ([place]) 05/04/2015    Priority: High  . Atherosclerotic PVD with intermittent claudication ([place]) 04/26/2015    Priority: High  . DNR (do not resuscitate) 09/29/2014    Priority: High  . Insulin resistance 07/19/2014    Priority: High  . LOW BACK PAIN 09/25/2007    Priority: High  . Insomnia 03/01/2020    Priority: Medium  . Major depression 03/25/2018    Priority: Medium  . BPPV (benign paroxysmal positional vertigo) 07/12/2016    Priority: Medium  . Hyperlipidemia 06/30/2015    Priority: Medium  . Former smoker 09/29/2014    Priority: Medium  . COPD (chronic obstructive pulmonary disease) ([place]) 09/20/2009    Priority: Medium  . Iron deficiency anemia 11/09/2007    Priority: Medium  . RESTLESS LEG SYNDROME 09/25/2007    Priority: Medium  . Essential hypertension 09/25/2007    Priority: Medium  . GERD 09/25/2007    Priority: Medium  . Osteoporosis 09/25/2007    Priority: Medium  . Abdominal aortic ectasia ([place]) 12/29/2019    Priority: Low  . Cat bite of right hand 12/21/2016    Priority:  Low  . Mallet toe of right foot 10/20/2015    Priority: Low  . Tinnitus 12/31/2014    Priority: Low  . Multinodular goiter 08/30/2013    Priority: Low  . Spinal stenosis of lumbar region at multiple levels 09/29/2012    Priority: Low  . HIP PAIN, BILATERAL 07/16/2010    Priority: Low  . CONSTIPATION, CHRONIC 09/20/2009    Priority: Low  . History of UTI 11/09/2007    Priority: Low  . Acute cholangitis 01/25/2020  . Acute cholecystitis 12/29/2019  . Adrenal nodule ([place]) 12/29/2019  . Hypokalemia 12/21/2016    Medications- reviewed and updated Current Outpatient Medications  Medication Sig Dispense Refill  . Albuterol Sulfate (PROAIR RESPICLICK) 962 (90 Base) MCG/ACT AEPB Inhale 2 puffs into the lungs every 6 (six) hours as needed (shortness of breath from COPD). 1 each 5  . apixaban (ELIQUIS) 5 MG TABS tablet Take 1 tablet (5 mg total) by mouth 2 (two) times daily. 60 tablet 11  . atorvastatin (LIPITOR) 40 MG tablet Take 1 tablet (40 mg total) by mouth daily. 90 tablet 3  . clopidogrel (PLAVIX) 75 MG tablet Take 1 tablet (75 mg total) by mouth daily. 90 tablet 1  . furosemide (LASIX) 40 MG tablet Take 1 tablet (40 mg total) by mouth daily. 30 tablet 3  . irbesartan (AVAPRO) 300 MG tablet Take 1 tablet (300 mg total) by mouth daily. 90 tablet 3  .  Multiple Vitamins-Minerals (PRESERVISION AREDS 2 PO) Take 1 tablet by mouth 2 (two) times daily.    . polyethylene glycol (MIRALAX / GLYCOLAX) 17 g packet Take 17 g by mouth 2 (two) times daily. Reported on 03/14/2016    . potassium chloride SA (KLOR-CON) 20 MEQ tablet Take 1 tablet (20 mEq total) by mouth daily as needed (take on days that you take lasix). 30 tablet 3  . Rotigotine 3 MG/24HR PT24 Place 3 mg onto the skin at bedtime. 90 patch 3  . sodium chloride flush (NS) 0.9 % SOLN 5 mLs by Intracatheter route every 8 (eight) hours.    . traMADol (ULTRAM) 50 MG tablet Take 50 mg by mouth as needed.    . traZODone (DESYREL) 50 MG tablet  Take 1.5 tablets (75 mg total) by mouth at bedtime as needed for sleep. 45 tablet 1   No current facility-administered medications for this visit.     Objective:  BP 126/80   Pulse (!) 59   Temp 98 F (36.7 C) (Temporal)   Ht 5\' 6"  (1.676 m)   Wt 141 lb 9.6 oz (64.2 kg)   LMP  (LMP Unknown)   SpO2 100%   BMI 22.85 kg/m  Gen: NAD, resting comfortably CV: RRR no murmurs rubs or gallops Lungs: CTAB no crackles, wheeze, rhonchi Ext: 1+ edema-worse on left foot-no calf pain or tenderness or edema Skin: warm, dry Neuro: In wheelchair but reports walking and walker at home    Assessment and Plan    #Hypertension/CHF S: Compliant with irbesartan 300 mg (ok per cards with renal artery stenosis only being unilateral)  and lasix 40mg  .  Patient with signs of fluid overload on last checks x-ray-has echocardiogram scheduled for early April-do suspect CHF.  Weight was down 13 pounds last visit and is down another 5 pounds this visit. Wt Readings from Last 3 Encounters:  03/01/20 141 lb 9.6 oz (64.2 kg)  02/22/20 146 lb (66.2 kg)  02/15/20 159 lb (72.1 kg)   -off hctz since starting lasix -off amlodiine march 2021 with edema issues .  Blood pressure high in past with caffeine intake A/P: Clinically patient with CHF-with significant improvement in fatigue/shortness of breath/edema and now down 18 pounds over several weeks on Lasix-now on 40 mg with much better response than 20 mg.  Pending echocardiogram as of tomorrow.  Will check electrolytes-has been started on potassium  Blood pressure is also well controlled-we will continue current medications  2-week follow-up planned and sooner if needed-may be able to go back down to 20 mg Lasix at follow-up but I am considering continuing this long-term instead of hydrochlorothiazide    #Hypokalemia-patient started on potassium to take each time she took a dose of Lasix due to potassium of 3.2 last week-we will plan on repeating BMP  today  #Anemia-patient with baseline hemoglobin primarily in 10-11 range since at least 2017-this year her numbers have been running as low as 8.9 on hemoglobin-up to 9.8 on last check-we will check CBC again with labs  # Insomnia/depression in full remission S: For insomnia-she has had a lot of improvement with symptoms. She is able to sleep all night. She takes the 50mg  at bedtime and takes the 25mg  every night about 2-3 am.  A/P: Reasonable control with trazodone 75 mg each evening-50 mg at bedtime and 25 mg if she wakes up-no lingering drowsiness the next day-continue current medication  -Depression remains in full remission without medication as well-suspect PHQ-9 will be  even lower than last visit of 4 given better sleep at this time  #Health maintenance-still needs covid immunization Immunization History  Administered Date(s) Administered  . Influenza Split 08/20/2011, 08/28/2012, 09/29/2012  . Influenza Whole 09/25/2007, 09/20/2009  . Influenza, High Dose Seasonal PF 09/03/2014, 08/19/2017, 08/18/2018, 09/16/2019  . Influenza,inj,Quad PF,6+ Mos 08/30/2013, 09/05/2015  . Influenza-Unspecified 08/22/2016  . Pneumococcal Conjugate-13 09/29/2014  . Pneumococcal Polysaccharide-23 12/02/2005  . Td 11/01/1993  . Tdap 11/27/2013  . Zoster 02/17/2007   Recommended follow up: 2-week follow-up Future Appointments  Date Time Provider Department Center  03/02/2020 10:30 AM MC[facility]CV [facility] ECHO 4 [facility]  03/13/2020 10:00 AM MC-IR 1 [facility]IR [facility]  04/03/2020 10:40 AM Shelva Majestic, MD [facility]  12/27/2020 12:45 PM Sherrie George, MD T[facility]-TRE None    Lab/Order associations:   ICD-10-CM   1. Essential hypertension  I10 CBC    Basic metabolic panel  2. Congestive heart failure, unspecified HF chronicity, unspecified heart failure type (HCC)  I50.9   3. Hypokalemia  E87.6   4. Anemia, unspecified type  D64.9   5. Recurrent major depressive disorder, in full remission (HCC)   F33.42   6. Insomnia, unspecified type  G47.00     Return precautions advised.  Tana Conch, MD

## 2020-03-01 NOTE — Patient Instructions (Addendum)
Continue lasix 40mg  for now- lets do another recheck in 2 weeks  Likely continue potassium- but we will recheck that today  Pretty please get your covid vaccine- get your son to help you out  Please stop by lab before you go If you do not have mychart- we will call you about results within 5 business days of receiving them.  If you have mychart- we will send your results within 3 business days of [place] receiving them.  If abnormal or we want to clarify a result, we will call or mychart you to make sure you receive the message.  If you have questions or concerns or don't hear within 5 business days, please send [place] a message or call us.   Recommended follow up: Return in about 2 weeks (around 03/15/2020) for follow up- or sooner if needed.

## 2020-03-02 ENCOUNTER — Ambulatory Visit (HOSPITAL_COMMUNITY): Payer: Medicare Other | Attending: Internal Medicine

## 2020-03-02 ENCOUNTER — Other Ambulatory Visit: Payer: Self-pay

## 2020-03-02 DIAGNOSIS — I10 Essential (primary) hypertension: Secondary | ICD-10-CM | POA: Diagnosis not present

## 2020-03-02 DIAGNOSIS — R0600 Dyspnea, unspecified: Secondary | ICD-10-CM

## 2020-03-02 DIAGNOSIS — N390 Urinary tract infection, site not specified: Secondary | ICD-10-CM | POA: Diagnosis not present

## 2020-03-02 DIAGNOSIS — I739 Peripheral vascular disease, unspecified: Secondary | ICD-10-CM | POA: Diagnosis not present

## 2020-03-02 DIAGNOSIS — K219 Gastro-esophageal reflux disease without esophagitis: Secondary | ICD-10-CM | POA: Diagnosis not present

## 2020-03-02 DIAGNOSIS — K5900 Constipation, unspecified: Secondary | ICD-10-CM | POA: Diagnosis not present

## 2020-03-02 DIAGNOSIS — E890 Postprocedural hypothyroidism: Secondary | ICD-10-CM | POA: Diagnosis not present

## 2020-03-02 DIAGNOSIS — D649 Anemia, unspecified: Secondary | ICD-10-CM | POA: Diagnosis not present

## 2020-03-02 DIAGNOSIS — M1991 Primary osteoarthritis, unspecified site: Secondary | ICD-10-CM | POA: Diagnosis not present

## 2020-03-02 DIAGNOSIS — A419 Sepsis, unspecified organism: Secondary | ICD-10-CM | POA: Diagnosis not present

## 2020-03-02 DIAGNOSIS — I509 Heart failure, unspecified: Secondary | ICD-10-CM

## 2020-03-02 DIAGNOSIS — I4891 Unspecified atrial fibrillation: Secondary | ICD-10-CM | POA: Diagnosis not present

## 2020-03-02 DIAGNOSIS — N3090 Cystitis, unspecified without hematuria: Secondary | ICD-10-CM | POA: Diagnosis not present

## 2020-03-02 DIAGNOSIS — K810 Acute cholecystitis: Secondary | ICD-10-CM | POA: Diagnosis not present

## 2020-03-02 DIAGNOSIS — G2581 Restless legs syndrome: Secondary | ICD-10-CM | POA: Diagnosis not present

## 2020-03-02 DIAGNOSIS — J449 Chronic obstructive pulmonary disease, unspecified: Secondary | ICD-10-CM | POA: Diagnosis not present

## 2020-03-02 MED ORDER — FUROSEMIDE 20 MG PO TABS: 20.0000 mg | ORAL_TABLET | Freq: Every day | ORAL | 5 refills | Status: DC

## 2020-03-03 DIAGNOSIS — N3090 Cystitis, unspecified without hematuria: Secondary | ICD-10-CM | POA: Diagnosis not present

## 2020-03-03 DIAGNOSIS — I4891 Unspecified atrial fibrillation: Secondary | ICD-10-CM | POA: Diagnosis not present

## 2020-03-03 DIAGNOSIS — K810 Acute cholecystitis: Secondary | ICD-10-CM | POA: Diagnosis not present

## 2020-03-03 DIAGNOSIS — A419 Sepsis, unspecified organism: Secondary | ICD-10-CM | POA: Diagnosis not present

## 2020-03-03 DIAGNOSIS — G2581 Restless legs syndrome: Secondary | ICD-10-CM | POA: Diagnosis not present

## 2020-03-03 DIAGNOSIS — N390 Urinary tract infection, site not specified: Secondary | ICD-10-CM | POA: Diagnosis not present

## 2020-03-03 MED FILL — NORMAL SALINE FLUSH SYRINGE: 0.9 | 40 days supply | Qty: 1200 | Fill #0

## 2020-03-04 ENCOUNTER — Ambulatory Visit: Payer: Medicare Other | Attending: Internal Medicine

## 2020-03-04 DIAGNOSIS — Z23 Encounter for immunization: Secondary | ICD-10-CM

## 2020-03-04 NOTE — Progress Notes (Signed)
   Covid-19 Vaccination Clinic  Name:  Cheryl Atkinson    MRN: [id number] DOB: 1931-05-13  03/04/2020  [name] was observed post Covid-19 immunization for 15 minutes without incident. She was provided with Vaccine Information Sheet and instruction to access the V-Safe system.   Ms. Key was instructed to call 911 with any severe reactions post vaccine: Marland Kitchen Difficulty breathing  . Swelling of face and throat  . A fast heartbeat  . A bad rash all over body  . Dizziness and weakness   Immunizations Administered    Name Date Dose VIS Date Route   Pfizer COVID-19 Vaccine 03/04/2020  1:52 PM 0.3 mL 11/12/2019 Intramuscular   Manufacturer: [employer], [employer]   Lot: [id number]   NDC: [address]-9

## 2020-03-06 DIAGNOSIS — N390 Urinary tract infection, site not specified: Secondary | ICD-10-CM | POA: Diagnosis not present

## 2020-03-06 DIAGNOSIS — K810 Acute cholecystitis: Secondary | ICD-10-CM | POA: Diagnosis not present

## 2020-03-06 DIAGNOSIS — A419 Sepsis, unspecified organism: Secondary | ICD-10-CM | POA: Diagnosis not present

## 2020-03-06 DIAGNOSIS — N3090 Cystitis, unspecified without hematuria: Secondary | ICD-10-CM | POA: Diagnosis not present

## 2020-03-06 DIAGNOSIS — G2581 Restless legs syndrome: Secondary | ICD-10-CM | POA: Diagnosis not present

## 2020-03-06 DIAGNOSIS — I4891 Unspecified atrial fibrillation: Secondary | ICD-10-CM | POA: Diagnosis not present

## 2020-03-08 ENCOUNTER — Other Ambulatory Visit: Payer: Self-pay

## 2020-03-08 ENCOUNTER — Telehealth: Payer: Self-pay | Admitting: Family Medicine

## 2020-03-08 DIAGNOSIS — G2581 Restless legs syndrome: Secondary | ICD-10-CM | POA: Diagnosis not present

## 2020-03-08 DIAGNOSIS — A419 Sepsis, unspecified organism: Secondary | ICD-10-CM | POA: Diagnosis not present

## 2020-03-08 DIAGNOSIS — I4891 Unspecified atrial fibrillation: Secondary | ICD-10-CM | POA: Diagnosis not present

## 2020-03-08 DIAGNOSIS — K810 Acute cholecystitis: Secondary | ICD-10-CM | POA: Diagnosis not present

## 2020-03-08 DIAGNOSIS — N390 Urinary tract infection, site not specified: Secondary | ICD-10-CM | POA: Diagnosis not present

## 2020-03-08 DIAGNOSIS — N3090 Cystitis, unspecified without hematuria: Secondary | ICD-10-CM | POA: Diagnosis not present

## 2020-03-08 MED ORDER — CLOPIDOGREL BISULFATE 75 MG PO TABS: 75.0000 mg | ORAL_TABLET | Freq: Every day | ORAL | 0 refills | Status: DC

## 2020-03-08 NOTE — Telephone Encounter (Signed)
Called patient informed that another provider called in 6 mo to mail order last month. Patient had not received. Per Dr. Durene Cal I have called in 30 day to local pharmacy but she will call mail order and see why not received as this will be much cheaper for her.

## 2020-03-08 NOTE — Telephone Encounter (Signed)
  LAST APPOINTMENT DATE: 03/01/2020   NEXT APPOINTMENT DATE:@4 /14/2021  MEDICATION:clopidogrel (PLAVIX) 75 MG tablet  PHARMACY:[id number] - [place], [place] - [address] AT [facility] [address]  **Let patient know to contact pharmacy at the end of the day to make sure medication is ready. **  ** Please notify patient to allow 48-72 hours to process**  **Encourage patient to contact the pharmacy for refills or they can request refills through [facility]  CLINICAL FILLS OUT ALL BELOW:   LAST REFILL:  QTY:  REFILL DATE:    OTHER COMMENTS:    Okay for refill?  Please advise

## 2020-03-10 DIAGNOSIS — K819 Cholecystitis, unspecified: Secondary | ICD-10-CM | POA: Diagnosis not present

## 2020-03-13 ENCOUNTER — Telehealth: Payer: Self-pay

## 2020-03-13 ENCOUNTER — Other Ambulatory Visit: Payer: Self-pay

## 2020-03-13 ENCOUNTER — Ambulatory Visit (HOSPITAL_COMMUNITY)
Admission: RE | Admit: 2020-03-13 | Discharge: 2020-03-13 | Disposition: A | Discharge status: Home / Self Care | Payer: Medicare Other | Source: Ambulatory Visit | Attending: General Surgery | Admitting: General Surgery

## 2020-03-13 ENCOUNTER — Other Ambulatory Visit: Payer: Self-pay | Admitting: General Surgery

## 2020-03-13 DIAGNOSIS — K8001 Calculus of gallbladder with acute cholecystitis with obstruction: Secondary | ICD-10-CM

## 2020-03-13 DIAGNOSIS — K8000 Calculus of gallbladder with acute cholecystitis without obstruction: Secondary | ICD-10-CM

## 2020-03-13 DIAGNOSIS — Z4803 Encounter for change or removal of drains: Secondary | ICD-10-CM | POA: Diagnosis not present

## 2020-03-13 DIAGNOSIS — N3090 Cystitis, unspecified without hematuria: Secondary | ICD-10-CM | POA: Diagnosis not present

## 2020-03-13 DIAGNOSIS — A419 Sepsis, unspecified organism: Secondary | ICD-10-CM | POA: Diagnosis not present

## 2020-03-13 DIAGNOSIS — K8010 Calculus of gallbladder with chronic cholecystitis without obstruction: Secondary | ICD-10-CM | POA: Diagnosis not present

## 2020-03-13 DIAGNOSIS — G2581 Restless legs syndrome: Secondary | ICD-10-CM | POA: Diagnosis not present

## 2020-03-13 DIAGNOSIS — K810 Acute cholecystitis: Secondary | ICD-10-CM | POA: Diagnosis not present

## 2020-03-13 DIAGNOSIS — N390 Urinary tract infection, site not specified: Secondary | ICD-10-CM | POA: Diagnosis not present

## 2020-03-13 DIAGNOSIS — I4891 Unspecified atrial fibrillation: Secondary | ICD-10-CM | POA: Diagnosis not present

## 2020-03-13 HISTORY — PX: IR EXCHANGE BILIARY DRAIN: IMG6046

## 2020-03-13 MED ORDER — IOHEXOL 300 MG/ML  SOLN
50.0000 mL | Freq: Once | INTRAMUSCULAR | Status: AC | PRN
  Administered 2020-03-13: 15 mL

## 2020-03-13 MED ORDER — LIDOCAINE HCL (PF) 1 % IJ SOLN
INTRAMUSCULAR | Status: DC | PRN
  Administered 2020-03-13: 10 mL

## 2020-03-13 MED ORDER — LIDOCAINE HCL 1 % IJ SOLN
INTRAMUSCULAR | Status: AC
  Filled 2020-03-13: qty 20

## 2020-03-13 NOTE — Telephone Encounter (Signed)
Pt has been scheduled to see [name], PA-C, 03/17/2020. Will route over to the requesting surgeon's office to make them aware.

## 2020-03-13 NOTE — Telephone Encounter (Signed)
   [facility] Pre-operative Risk Assessment    Request for surgical clearance:  1. What type of surgery is being performed? Laparoscopic Cholecystectomy    2. When is this surgery scheduled? TBD   3. What type of clearance is required (medical clearance vs. Pharmacy clearance to hold med vs. Both)? BOTH  4. Are there any medications that need to be held prior to surgery and how long?  Plavix and Eliquis  5. Practice name and name of physician performing surgery? [facility]- Dr. Georganna Skeans    6. What is your office phone number [phone]    7.   What is your office fax number [phone] ATTN: Andreas Blower   8.   Anesthesia type (None, local, MAC, general) ? General    Ena Dawley 03/13/2020, 2:23 PM  _________________________________________________________________   (provider comments below)

## 2020-03-13 NOTE — Telephone Encounter (Signed)
I worry about her bleeding risk- may hold eliquis 2 days prior and 1 day after surgery. Obviously there is some increased stroke risk due to the high chadsvasc but I believe benefits outweight the risks  Thanks, Tana Conch

## 2020-03-13 NOTE — Telephone Encounter (Signed)
Patient with diagnosis of afib on Eliquis for anticoagulation.    Procedure: Laparoscopic Cholecystectomy  Date of procedure: TBD  CHADS2-VASc score of  7 (CHF, HTN, AGE, DM2,  CAD/PVD, AGE, female)  CrCl 43 ml/min  Patient is high risk off anticoagulation due to her high CHADS2-VASc score. Eliquis is managed by Dr. Durene Cal. I will defer length of hold to Dr. Durene Cal.

## 2020-03-13 NOTE — Telephone Encounter (Signed)
   Primary Cardiologist:Jonathan Allyson Sabal, MD  Chart reviewed as part of pre-operative protocol coverage. Recently seen by her PCP for management of acute diastolic CHF. Given recent volume overload, feel she would be better suited for an in-office visit to assess her preoperative cardiovascular risk.  Pre-op covering staff: - Please schedule appointment and call patient to inform them. - Please contact requesting surgeon's office via preferred method (i.e, phone, fax) to inform them of need for appointment prior to surgery.  I will route to pharmacy for input on holding eliquis, as well as Dr. Allyson Sabal for input on holding plavix.   Beatriz Stallion, PA-C  03/13/2020, 4:02 PM

## 2020-03-13 NOTE — Procedures (Signed)
Interventional Radiology Procedure Note  Procedure: cholecystostomy exchg  Complications: None  Estimated Blood Loss: min  Findings: 10 fr cholecystostomy exchged

## 2020-03-15 ENCOUNTER — Ambulatory Visit (INDEPENDENT_AMBULATORY_CARE_PROVIDER_SITE_OTHER): Payer: Medicare Other | Admitting: Family Medicine

## 2020-03-15 ENCOUNTER — Encounter: Payer: Self-pay | Admitting: Family Medicine

## 2020-03-15 ENCOUNTER — Other Ambulatory Visit: Payer: Self-pay

## 2020-03-15 ENCOUNTER — Telehealth: Payer: Self-pay | Admitting: Family Medicine

## 2020-03-15 VITALS — BP 110/62 | HR 83 | Temp 98.0°F | Ht 66.0 in | Wt 140.0 lb

## 2020-03-15 DIAGNOSIS — I10 Essential (primary) hypertension: Secondary | ICD-10-CM | POA: Diagnosis not present

## 2020-03-15 DIAGNOSIS — E782 Mixed hyperlipidemia: Secondary | ICD-10-CM

## 2020-03-15 DIAGNOSIS — I509 Heart failure, unspecified: Secondary | ICD-10-CM | POA: Diagnosis not present

## 2020-03-15 LAB — CBC WITH DIFFERENTIAL/PLATELET
Basophils Absolute: 0.1 10*3/uL (ref 0.0–0.1)
Basophils Relative: 1 % (ref 0.0–3.0)
Eosinophils Absolute: 0.3 10*3/uL (ref 0.0–0.7)
Eosinophils Relative: 4.6 % (ref 0.0–5.0)
HCT: 33 % — ABNORMAL LOW (ref 36.0–46.0)
Hemoglobin: 10.9 g/dL — ABNORMAL LOW (ref 12.0–15.0)
Lymphocytes Relative: 30.3 % (ref 12.0–46.0)
Lymphs Abs: 2.1 10*3/uL (ref 0.7–4.0)
MCHC: 33 g/dL (ref 30.0–36.0)
MCV: 86.1 fl (ref 78.0–100.0)
Monocytes Absolute: 0.5 10*3/uL (ref 0.1–1.0)
Monocytes Relative: 7.7 % (ref 3.0–12.0)
Neutro Abs: 3.9 10*3/uL (ref 1.4–7.7)
Neutrophils Relative %: 56.4 % (ref 43.0–77.0)
Platelets: 272 10*3/uL (ref 150.0–400.0)
RBC: 3.83 Mil/uL — ABNORMAL LOW (ref 3.87–5.11)
RDW: 14.2 % (ref 11.5–15.5)
WBC: 6.9 10*3/uL (ref 4.0–10.5)

## 2020-03-15 LAB — COMPREHENSIVE METABOLIC PANEL
ALT: 9 U/L (ref 0–35)
AST: 13 U/L (ref 0–37)
Albumin: 4.2 g/dL (ref 3.5–5.2)
Alkaline Phosphatase: 99 U/L (ref 39–117)
BUN: 51 mg/dL — ABNORMAL HIGH (ref 6–23)
CO2: 24 mEq/L (ref 19–32)
Calcium: 9.2 mg/dL (ref 8.4–10.5)
Chloride: 101 mEq/L (ref 96–112)
Creatinine, Ser: 0.98 mg/dL (ref 0.40–1.20)
GFR: 53.74 mL/min — ABNORMAL LOW (ref >60.00)
Glucose, Bld: 97 mg/dL (ref 70–99)
Potassium: 4.6 mEq/L (ref 3.5–5.1)
Sodium: 136 mEq/L (ref 135–145)
Total Bilirubin: 0.7 mg/dL (ref 0.2–1.2)
Total Protein: 7.1 g/dL (ref 6.0–8.3)

## 2020-03-15 LAB — LDL CHOLESTEROL, DIRECT: Direct LDL: 55 mg/dL

## 2020-03-15 NOTE — Progress Notes (Signed)
Phone [phone] In person visit   Subjective:   Cheryl Atkinson is a 86 y.o. year old very pleasant female patient who presents for/with See problem oriented charting Chief Complaint  Patient presents with  . Hypertension    This visit occurred during the SARS-CoV-2 public health emergency.  Safety protocols were in place, including screening questions prior to the visit, additional usage of staff PPE, and extensive cleaning of exam room while observing appropriate contact time as indicated for disinfecting solutions.   Past Medical History-  Patient Active Problem List   Diagnosis Date Noted  . CHF (congestive heart failure) (HCC) 03/01/2020    Priority: High  . Bacteremia 01/28/2020    Priority: High  . Atrial fibrillation (HCC) 01/25/2020    Priority: High  . Memory loss 04/11/2016    Priority: High  . Renal artery stenosis (HCC) 09/27/2015    Priority: High  . Claudication (HCC) 05/04/2015    Priority: High  . Atherosclerotic PVD with intermittent claudication (HCC) 04/26/2015    Priority: High  . DNR (do not resuscitate) 09/29/2014    Priority: High  . Insulin resistance 07/19/2014    Priority: High  . LOW BACK PAIN 09/25/2007    Priority: High  . Insomnia 03/01/2020    Priority: Medium  . Major depression 03/25/2018    Priority: Medium  . BPPV (benign paroxysmal positional vertigo) 07/12/2016    Priority: Medium  . Hyperlipidemia 06/30/2015    Priority: Medium  . Former smoker 09/29/2014    Priority: Medium  . COPD (chronic obstructive pulmonary disease) (HCC) 09/20/2009    Priority: Medium  . Iron deficiency anemia 11/09/2007    Priority: Medium  . RESTLESS LEG SYNDROME 09/25/2007    Priority: Medium  . Essential hypertension 09/25/2007    Priority: Medium  . GERD 09/25/2007    Priority: Medium  . Osteoporosis 09/25/2007    Priority: Medium  . Abdominal aortic ectasia (HCC) 12/29/2019    Priority: Low  . Cat bite of right hand 12/21/2016   Priority: Low  . Mallet toe of right foot 10/20/2015    Priority: Low  . Tinnitus 12/31/2014    Priority: Low  . Multinodular goiter 08/30/2013    Priority: Low  . Spinal stenosis of lumbar region at multiple levels 09/29/2012    Priority: Low  . HIP PAIN, BILATERAL 07/16/2010    Priority: Low  . CONSTIPATION, CHRONIC 09/20/2009    Priority: Low  . History of UTI 11/09/2007    Priority: Low  . Acute cholangitis 01/25/2020  . Acute cholecystitis 12/29/2019  . Adrenal nodule (HCC) 12/29/2019  . Hypokalemia 12/21/2016    Medications- reviewed and updated Current Outpatient Medications  Medication Sig Dispense Refill  . Albuterol Sulfate (PROAIR RESPICLICK) 108 (90 Base) MCG/ACT AEPB Inhale 2 puffs into the lungs every 6 (six) hours as needed (shortness of breath from COPD). 1 each 5  . apixaban (ELIQUIS) 5 MG TABS tablet Take 1 tablet (5 mg total) by mouth 2 (two) times daily. 60 tablet 11  . atorvastatin (LIPITOR) 40 MG tablet Take 1 tablet (40 mg total) by mouth daily. 90 tablet 3  . clopidogrel (PLAVIX) 75 MG tablet Take 1 tablet (75 mg total) by mouth daily. 30 tablet 0  . furosemide (LASIX) 20 MG tablet Take 1 tablet (20 mg total) by mouth daily. 30 tablet 5  . irbesartan (AVAPRO) 300 MG tablet Take 1 tablet (300 mg total) by mouth daily. 90 tablet 3  . Multiple Vitamins-Minerals (  PRESERVISION AREDS 2 PO) Take 1 tablet by mouth 2 (two) times daily.    . polyethylene glycol (MIRALAX / GLYCOLAX) 17 g packet Take 17 g by mouth 2 (two) times daily. Reported on 03/14/2016    . potassium chloride SA (KLOR-CON) 20 MEQ tablet Take 1 tablet (20 mEq total) by mouth daily as needed (take on days that you take lasix). 30 tablet 3  . Rotigotine 3 MG/24HR PT24 Place 3 mg onto the skin at bedtime. 90 patch 3  . sodium chloride flush (NS) 0.9 % SOLN 5 mLs by Intracatheter route every 8 (eight) hours.    . traMADol (ULTRAM) 50 MG tablet Take 50 mg by mouth as needed.    . traZODone (DESYREL) 50  MG tablet Take 1.5 tablets (75 mg total) by mouth at bedtime as needed for sleep. 45 tablet 1   No current facility-administered medications for this visit.     Objective:  BP 110/62   Pulse 83   Temp 98 F (36.7 C) (Temporal)   Ht 5\' 6"  (1.676 m)   Wt 140 lb (63.5 kg)   LMP  (LMP Unknown)   SpO2 96%   BMI 22.60 kg/m  Gen: NAD, resting comfortably in wheelchair but reports very mobile with walker CV: RRR no murmurs rubs or gallops Lungs: CTAB no crackles, wheeze, rhonchi Ext: no edema- significant/vast improvement from last several visits Skin: warm, dry    Assessment and Plan    #Hypertension/ suspected CHF S: Compliant with irbesartan 300 mg (ok per cards as only unilateral) and lasix 40mg --> 20mg  (reduced dose due to decreased GFR)  -off hctz since starting lasix -off amlodiine march 2021 with edema issues .  Blood pressure high in past with caffeine intake  Echocardiogram on April 1-moderate mitral regurgitation but with EF of 60%.  Diastolic parameters were indeterminate  She has been getting stronger and stronger- able to walk on her own with walker most of the time but sometimes not. She is working with PT. Surgeon wants gallbladder removed but pending cardiology clearance.  BP Readings from Last 3 Encounters:  03/15/20 110/62  03/01/20 126/80  02/22/20 128/76  A/P: Blood pressure well controlled-continue irbesartan 300 mg and Lasix 20 mg.  I do still strongly suspect diastolic CHF given shortness of breath and 19 pound weight loss with diuresis that improved shortness of breath and edema significantly.  We will continue Lasix at 20 mg. She will see cardiology on Friday for clearance before cholecystectomy also asking for their opinoin on CHF    #hyperlipidemia S: compliant with atorvastatin 40mg  Lab Results  Component Value Date   CHOL 237 (H) 10/05/2019   HDL 92.00 10/05/2019   LDLCALC 129 (H) 10/05/2019   LDLDIRECT 74.0 05/15/2017   TRIG 79.0 10/05/2019    CHOLHDL 3 10/05/2019   A/P: last LDL was above goal- we will recheck today- may need to try rosuvasatint 40mg  instead of LDL above 70 with history of renal artery stenosis   Recommended follow up:  1-3 month follow up (I would reschedule the may 3rd visit) Future Appointments  Date Time Provider Department Center  03/17/2020 11:45 AM 13/01/2019 M., PA-C [facility]  03/29/2020  2:00 PM [facility]  04/03/2020 10:40 AM 03/19/2020, MD [facility] PEC  04/24/2020 10:00 AM [facility] 1 [facility]  12/27/2020 12:45 PM 06/03/2020, MD TRE-TRE None   Lab/Order associations:   ICD-10-CM   1. Mixed hyperlipidemia  E78.2  2. Essential hypertension  I10   3. Congestive heart failure, unspecified HF chronicity, unspecified heart failure type ([place])  I50.9    Return precautions advised.  Garret Reddish, MD

## 2020-03-15 NOTE — Patient Instructions (Addendum)
You are doing awesome! Lets continue current meds unless labs lead [place] to make changes   Recommended follow up:  1-3 month follow up (I would reschedule the may 3rd visit)      Please stop by lab before you go If you do not have mychart- we will call you about results within 5 business days of [place] receiving them.  If you have mychart- we will send your results within 3 business days of [place] receiving them.  If abnormal or we want to clarify a result, we will call or mychart you to make sure you receive the message.  If you have questions or concerns or don't hear within 5 business days, please send [place] a message or call us.

## 2020-03-15 NOTE — Telephone Encounter (Signed)
Patient states [facility] is needing more information from the doctor about her prescriptions that were sent.  [facility] - [place], [place] - [address] Phone:  [phone]  Fax:  [phone]

## 2020-03-16 ENCOUNTER — Other Ambulatory Visit: Payer: Self-pay

## 2020-03-16 DIAGNOSIS — K810 Acute cholecystitis: Secondary | ICD-10-CM | POA: Diagnosis not present

## 2020-03-16 DIAGNOSIS — I4891 Unspecified atrial fibrillation: Secondary | ICD-10-CM | POA: Diagnosis not present

## 2020-03-16 DIAGNOSIS — A419 Sepsis, unspecified organism: Secondary | ICD-10-CM | POA: Diagnosis not present

## 2020-03-16 DIAGNOSIS — N390 Urinary tract infection, site not specified: Secondary | ICD-10-CM | POA: Diagnosis not present

## 2020-03-16 DIAGNOSIS — I509 Heart failure, unspecified: Secondary | ICD-10-CM

## 2020-03-16 DIAGNOSIS — N3090 Cystitis, unspecified without hematuria: Secondary | ICD-10-CM | POA: Diagnosis not present

## 2020-03-16 DIAGNOSIS — G2581 Restless legs syndrome: Secondary | ICD-10-CM | POA: Diagnosis not present

## 2020-03-16 MED ORDER — APIXABAN 5 MG PO TABS: 5.0000 mg | ORAL_TABLET | Freq: Two times a day (BID) | ORAL | 11 refills | Status: DC

## 2020-03-16 MED ORDER — ATORVASTATIN CALCIUM 40 MG PO TABS: 40.0000 mg | ORAL_TABLET | Freq: Every day | ORAL | 3 refills | Status: DC

## 2020-03-16 MED ORDER — CLOPIDOGREL BISULFATE 75 MG PO TABS: 75.0000 mg | ORAL_TABLET | Freq: Every day | ORAL | 0 refills | Status: DC

## 2020-03-16 MED ORDER — FUROSEMIDE 20 MG PO TABS: 20.0000 mg | ORAL_TABLET | Freq: Every day | ORAL | 5 refills | Status: DC

## 2020-03-16 NOTE — Progress Notes (Addendum)
Cardiology Office Note   Date:  03/17/2020   ID:  Cheryl Atkinson, DOB [birth year], MRN [id number]  PCP:  Shelva Majestic, MD  Cardiologist:  [name], MD EP: None  Chief Complaint  Patient presents with  . Pre-op Exam      History of Present Illness: Cheryl Atkinson is a 86 y.o. female with PMH of PVD s/p right common iliac artery stenting in 2016 with subsequent balloon angioplasty to right SFA in 2016 (reoccluded on imaging 01/2019), paroxysmal atrial fibrillation, HTN, HLD, COPD, and GERD, who presents for preoperative evaluation.  She was last evaluated by cardiology at an outpatient visit with Dr. Allyson Sabal 07/20/2019, at which time she was reported to be doing well, though noted some DOE and lifestyle limiting claudication. More recently she has been following with her PCP for LE edema and SOB. It was felt her symptoms were 2/2 diastolic CHF. She was diuresed with lasix and reported to have lose 19lbs with improvement in LE edema and SOB. She had an echocardiogram 03/02/20 which showed EF 60-65%, indeterminate LV diastolic function, elevated LVEDP, no RWMA, moderately dialted LA, moderate MR, and mild dilation of the ascending aorta (57mm).   She presents today with her cousin for preoperative evaluation. She has been recovering her strength following a hospitalization Jan/Feb 2021 where she was diagnosed with acute cholecystitis c/b bacteremia which was managed with a cholecystostomy tube at that time. She went to a rehab facility following that hospitalization where did poorly - became weaker and struggled with swelling in her legs. She has been working with her PCP to resolved her LE edema. Her weight is down significantly and her swelling is gone at this time. She works with home PT 3 days per week. She is able to walk around her home, dress herself, and bathe herself, otherwise is fairly limited in activity and unable to complete 4 METs. She has no complaints of chest pain or SOB  at this time. No complaint of palpitations or racing heart beat sensations; historically is unaware of her atrial fibrillation. No complaints of dizziness, lightheadedness, syncope, or bleeding.     Past Medical History:  Diagnosis Date  . Anemia   . Arthritis    "shoulders" (05/04/2015)  . Cellulitis of right lower extremity 11/27/2013  . Chronic lower back pain   . Constipation   . GERD (gastroesophageal reflux disease)   . History of hiatal hernia   . Hypertension   . Osteoporosis   . Peripheral arterial disease (HCC)   . Restless leg syndrome   . Thyroid disease     Past Surgical History:  Procedure Laterality Date  . ABDOMINAL AORTAGRAM  05/04/2015   Procedure: Abdominal Aortagram;  Surgeon: Runell Gess, MD;  Location: [facility] INVASIVE CV LAB;  Service: Cardiovascular;;  . APPENDECTOMY    . CATARACT EXTRACTION W/ INTRAOCULAR LENS  IMPLANT, BILATERAL Bilateral   . I & D EXTREMITY Right 12/22/2016   Procedure: IRRIGATION AND DEBRIDEMENT EXTREMITY;  Surgeon: Mack Hook, MD;  Location: [facility] OR;  Service: Orthopedics;  Laterality: Right;  . IR EXCHANGE BILIARY DRAIN  03/13/2020  . IR PATIENT EVAL TECH 0-60 MINS  12/30/2019  . IR PERC CHOLECYSTOSTOMY  01/27/2020  . PERIPHERAL VASCULAR CATHETERIZATION N/A 05/04/2015   Procedure: Lower Extremity Angiography;  Surgeon: Runell Gess, MD;  Location: [facility] INVASIVE CV LAB;  Service: Cardiovascular;  Laterality: N/A;  . PERIPHERAL VASCULAR CATHETERIZATION  05/04/2015   Procedure: Peripheral Vascular Intervention;  Surgeon:  Runell Gess, MD;  Location: [facility] INVASIVE CV LAB;  Service: Cardiovascular;;  RCIA - 7x22 ICAST  . PERIPHERAL VASCULAR CATHETERIZATION Right 09/04/2015   Procedure: Peripheral Vascular Atherectomy;  Surgeon: Runell Gess, MD;  Location: [facility] INVASIVE CV LAB;  Service: Cardiovascular;  Laterality: Right;  SFA  . sfa Right 09/04/2015   de balloon  . THYROID SURGERY Right ?2013   "had goiter taken off my neck"  .  TONSILLECTOMY    . VAGINAL HYSTERECTOMY       Current Outpatient Medications  Medication Sig Dispense Refill  . Albuterol Sulfate (PROAIR RESPICLICK) 108 (90 Base) MCG/ACT AEPB Inhale 2 puffs into the lungs every 6 (six) hours as needed (shortness of breath from COPD). 1 each 5  . apixaban (ELIQUIS) 5 MG TABS tablet Take 1 tablet (5 mg total) by mouth 2 (two) times daily. 60 tablet 11  . atorvastatin (LIPITOR) 40 MG tablet Take 1 tablet (40 mg total) by mouth daily. 90 tablet 3  . clopidogrel (PLAVIX) 75 MG tablet Take 1 tablet (75 mg total) by mouth daily. 30 tablet 0  . furosemide (LASIX) 20 MG tablet Take 1 tablet (20 mg total) by mouth daily. 30 tablet 5  . irbesartan (AVAPRO) 300 MG tablet Take 1 tablet (300 mg total) by mouth daily. 90 tablet 3  . Multiple Vitamins-Minerals (PRESERVISION AREDS 2 PO) Take 1 tablet by mouth 2 (two) times daily.    . polyethylene glycol (MIRALAX / GLYCOLAX) 17 g packet Take 17 g by mouth 2 (two) times daily. Reported on 03/14/2016    . potassium chloride SA (KLOR-CON) 20 MEQ tablet Take 1 tablet (20 mEq total) by mouth daily as needed (take on days that you take lasix). 30 tablet 3  . Rotigotine 3 MG/24HR PT24 Place 3 mg onto the skin at bedtime. 90 patch 3  . sodium chloride flush (NS) 0.9 % SOLN 5 mLs by Intracatheter route every 8 (eight) hours.    . traMADol (ULTRAM) 50 MG tablet Take 50 mg by mouth as needed.    . traZODone (DESYREL) 50 MG tablet Take 1.5 tablets (75 mg total) by mouth at bedtime as needed for sleep. 45 tablet 1   No current facility-administered medications for this visit.    Allergies:   Codeine    Social History:  The patient  reports that she quit smoking about 4 years ago. Her smoking use included cigarettes. She has a 30.00 pack-year smoking history. She has never used smokeless tobacco. She reports that she does not drink alcohol or use drugs.   Family History:  The patient's family history includes Atrial fibrillation in  her sister; Cancer in her paternal grandmother; Coronary artery disease in an other family member; Heart attack in her father; Heart disease in her father and mother; Stroke in her maternal grandmother.    ROS:  Please see the history of present illness.   Otherwise, review of systems are positive for none.   All other systems are reviewed and negative.    PHYSICAL EXAM: VS:  BP (!) 128/58   Pulse 85   Ht 5\' 6"  (1.676 m)   Wt 141 lb 9.6 oz (64.2 kg)   LMP  (LMP Unknown)   SpO2 99%   BMI 22.85 kg/m  , BMI Body mass index is 22.85 kg/m. GEN: Elderly female sitting in wheelchair in no acute distress HEENT: sclera anicteric Neck: no JVD, carotid bruits, or masses Cardiac: IRIR; no murmurs, rubs, or gallops, no  edema  Respiratory:  clear to auscultation bilaterally, normal work of breathing GI: soft, nontender, nondistended, + BS MS: no deformity or atrophy Skin: warm and dry, no rash Neuro:  Strength and sensation are intact Psych: euthymic mood, full affect   EKG:  EKG is ordered today. The ekg ordered today demonstrates atrial fibrillation, rate 89 bpm, no STE/D, no TWI; EKG from 01/2020 with suspected sinus rhythm though significant artifact; 12/2019 with atrial fibrillation with CVR   Recent Labs: 02/11/2020: Pro B Natriuretic peptide (BNP) 228.0; TSH 1.39 03/15/2020: ALT 9; BUN 51; Creatinine, Ser 0.98; Hemoglobin 10.9; Platelets 272.0; Potassium 4.6; Sodium 136    Lipid Panel    Component Value Date/Time   CHOL 237 (H) 10/05/2019 1159   CHOL 179 05/06/2018 0938   TRIG 79.0 10/05/2019 1159   HDL 92.00 10/05/2019 1159   HDL 92 05/06/2018 0938   CHOLHDL 3 10/05/2019 1159   VLDL 15.8 10/05/2019 1159   LDLCALC 129 (H) 10/05/2019 1159   LDLCALC 73 05/06/2018 0938   LDLDIRECT 55.0 03/15/2020 1038      Wt Readings from Last 3 Encounters:  03/17/20 141 lb 9.6 oz (64.2 kg)  03/15/20 140 lb (63.5 kg)  03/01/20 141 lb 9.6 oz (64.2 kg)      Other studies  Reviewed: Additional studies/ records that were reviewed today include:   Echocardiogram 03/02/20: 1. Left ventricular ejection fraction, by estimation, is 60 to 65%. The  left ventricle has normal function. The left ventricle has no regional  wall motion abnormalities. There is mild left ventricular hypertrophy.  Left ventricular diastolic parameters  are indeterminate. Elevated left ventricular end-diastolic pressure.  2. Right ventricular systolic function is normal. The right ventricular  size is normal. There is mildly elevated pulmonary artery systolic  pressure.  3. Left atrial size was moderately dilated.  4. The mitral valve is degenerative. Moderate mitral valve regurgitation.  No evidence of mitral stenosis.  5. The aortic valve is abnormal. Aortic valve regurgitation is not  visualized. No aortic stenosis is present.  6. Aortic dilatation noted. There is mild dilatation of the ascending  aorta measuring 39 mm.  7. The inferior vena cava is normal in size with greater than 50%  respiratory variability, suggesting right atrial pressure of 3 mmHg.    ASSESSMENT AND PLAN:  1. Preoperative assessment: patient is anticipating a cholecystectomy.  She is eager to get this done as the cholecystostomy tube is cumbersome and she is fearful of recurrent cholecystitis to due chronic gallstones. She cannot complete 4 METs due to weakness and claudication, though has no anginal complaints. Her revised cardiac risk index score is at least 2 (high risk procedure and CHF history, though likely has some degree of CAD given PAD history) with a 10.1% risk of adverse event in the perioperative setting. Recent echo with EF 60-65% with no RWMA. - Will route to Dr. Allyson Sabal for his input, though favor no further cardiac work-up prior to surgery. - Anticipate she will be cleared to hold plavix 5 days prior to her upcoming procedure - Eliquis is managed by her PCP - per Dr. Durene Cal, she has been cleared  to hold eliquis 2 days prior to her upcoming surgery and restart 1 day after surgery if cleared to do so by her surgeon. - Will route to the requesting surgeons office once I hear back from Dr. Allyson Sabal.   2. Chronic diastolic CHF: sounds like she recently struggled with acute diastolic CHF. Diuresed 19lbs with po lasix  directed by her PCP. SOB and LE edema resolved. Echo 03/2020 was reassuring.   - Continue lasix 20 mg QOD with plans to take additional lasix for 3lb weight gain overnight or 5lbs in 1 week.  - Recommended to monitor daily weights and limit salt intake  3. PVD: s/p right common iliac stenting in 2016 with staged intervention to right SFA. Right SFA found to be occluded on dopplers 01/2019. She continues to have lifestyle limiting claudication, though she is not interested in invasive work-up at that time.  - Continue plavix and statin  4. Persistent atrial fibrillation: Not on rate controlling medications. No complaints of bleeding. She is in atrial fibrillation today with HR 89 bpm. Suspect this has been persistent since 12/2019 - Continue apixaban for stroke ppx  5. HTN: BP 128/58 today - Continue lasix and irbesartan  6. HLD: LDL 55 03/15/20 - Continue atorvastatin  7. Mitral regurgitation: moderate on echo this month.  - Continue to monitor routinely going forward. Anticipate repeat echo in 1 year    Current medicines are reviewed at length with the patient today.  The patient does not have concerns regarding medicines.  The following changes have been made:  As above  Labs/ tests ordered today include:  No orders of the defined types were placed in this encounter.    Disposition:   FU with Dr. Gwenlyn Found in 3 months  Signed, Abigail Butts, PA-C  03/17/2020 11:51 AM         Primary Cardiologist: Quay Burow, MD  As above, and per Dr. Gwenlyn Found, Jenita Seashore would be at acceptable risk for the planned procedure without further cardiovascular testing.   She can  hold plavix 5 days prior to her upcoming surgery with plans to restart as soon as she is cleared to do so by her surgeon.   Per her PCP, Dr. Yong Channel, she can hold apixaban 2 days prior to her surgery with plans to restart 1 day afterwards if cleared to do so by her surgeon.  I will route this recommendation to the requesting party via Epic fax function.  Please call with questions.  Abigail Butts, PA-C 03/17/2020, 2:20 PM

## 2020-03-16 NOTE — Telephone Encounter (Signed)
Rx sent to [facility].  ?

## 2020-03-17 ENCOUNTER — Other Ambulatory Visit: Payer: Self-pay

## 2020-03-17 ENCOUNTER — Ambulatory Visit (INDEPENDENT_AMBULATORY_CARE_PROVIDER_SITE_OTHER): Payer: Medicare Other | Admitting: Medical

## 2020-03-17 ENCOUNTER — Encounter: Payer: Self-pay | Admitting: Medical

## 2020-03-17 VITALS — BP 128/58 | HR 85 | Ht 66.0 in | Wt 141.6 lb

## 2020-03-17 DIAGNOSIS — K810 Acute cholecystitis: Secondary | ICD-10-CM | POA: Diagnosis not present

## 2020-03-17 DIAGNOSIS — Z01810 Encounter for preprocedural cardiovascular examination: Secondary | ICD-10-CM | POA: Diagnosis not present

## 2020-03-17 DIAGNOSIS — I340 Nonrheumatic mitral (valve) insufficiency: Secondary | ICD-10-CM

## 2020-03-17 DIAGNOSIS — I5032 Chronic diastolic (congestive) heart failure: Secondary | ICD-10-CM

## 2020-03-17 DIAGNOSIS — I739 Peripheral vascular disease, unspecified: Secondary | ICD-10-CM | POA: Diagnosis not present

## 2020-03-17 DIAGNOSIS — I4819 Other persistent atrial fibrillation: Secondary | ICD-10-CM

## 2020-03-17 DIAGNOSIS — A419 Sepsis, unspecified organism: Secondary | ICD-10-CM | POA: Diagnosis not present

## 2020-03-17 DIAGNOSIS — N390 Urinary tract infection, site not specified: Secondary | ICD-10-CM | POA: Diagnosis not present

## 2020-03-17 DIAGNOSIS — G2581 Restless legs syndrome: Secondary | ICD-10-CM | POA: Diagnosis not present

## 2020-03-17 DIAGNOSIS — N3090 Cystitis, unspecified without hematuria: Secondary | ICD-10-CM | POA: Diagnosis not present

## 2020-03-17 DIAGNOSIS — E782 Mixed hyperlipidemia: Secondary | ICD-10-CM | POA: Diagnosis not present

## 2020-03-17 DIAGNOSIS — I10 Essential (primary) hypertension: Secondary | ICD-10-CM | POA: Diagnosis not present

## 2020-03-17 DIAGNOSIS — I4891 Unspecified atrial fibrillation: Secondary | ICD-10-CM | POA: Diagnosis not present

## 2020-03-17 NOTE — Patient Instructions (Signed)

## 2020-03-17 NOTE — Progress Notes (Signed)
Completely agree with everything you said

## 2020-03-20 DIAGNOSIS — N3090 Cystitis, unspecified without hematuria: Secondary | ICD-10-CM | POA: Diagnosis not present

## 2020-03-20 DIAGNOSIS — N390 Urinary tract infection, site not specified: Secondary | ICD-10-CM | POA: Diagnosis not present

## 2020-03-20 DIAGNOSIS — K810 Acute cholecystitis: Secondary | ICD-10-CM | POA: Diagnosis not present

## 2020-03-20 DIAGNOSIS — A419 Sepsis, unspecified organism: Secondary | ICD-10-CM | POA: Diagnosis not present

## 2020-03-20 DIAGNOSIS — G2581 Restless legs syndrome: Secondary | ICD-10-CM | POA: Diagnosis not present

## 2020-03-20 DIAGNOSIS — I4891 Unspecified atrial fibrillation: Secondary | ICD-10-CM | POA: Diagnosis not present

## 2020-03-21 ENCOUNTER — Other Ambulatory Visit (INDEPENDENT_AMBULATORY_CARE_PROVIDER_SITE_OTHER): Payer: Medicare Other

## 2020-03-21 DIAGNOSIS — I10 Essential (primary) hypertension: Secondary | ICD-10-CM

## 2020-03-22 DIAGNOSIS — K810 Acute cholecystitis: Secondary | ICD-10-CM | POA: Diagnosis not present

## 2020-03-22 DIAGNOSIS — G2581 Restless legs syndrome: Secondary | ICD-10-CM | POA: Diagnosis not present

## 2020-03-22 DIAGNOSIS — N3090 Cystitis, unspecified without hematuria: Secondary | ICD-10-CM | POA: Diagnosis not present

## 2020-03-22 DIAGNOSIS — I4891 Unspecified atrial fibrillation: Secondary | ICD-10-CM | POA: Diagnosis not present

## 2020-03-22 DIAGNOSIS — A419 Sepsis, unspecified organism: Secondary | ICD-10-CM | POA: Diagnosis not present

## 2020-03-22 DIAGNOSIS — N390 Urinary tract infection, site not specified: Secondary | ICD-10-CM | POA: Diagnosis not present

## 2020-03-23 DIAGNOSIS — N390 Urinary tract infection, site not specified: Secondary | ICD-10-CM | POA: Diagnosis not present

## 2020-03-23 DIAGNOSIS — G2581 Restless legs syndrome: Secondary | ICD-10-CM | POA: Diagnosis not present

## 2020-03-23 DIAGNOSIS — I4891 Unspecified atrial fibrillation: Secondary | ICD-10-CM | POA: Diagnosis not present

## 2020-03-23 DIAGNOSIS — N3090 Cystitis, unspecified without hematuria: Secondary | ICD-10-CM | POA: Diagnosis not present

## 2020-03-23 DIAGNOSIS — K810 Acute cholecystitis: Secondary | ICD-10-CM | POA: Diagnosis not present

## 2020-03-23 DIAGNOSIS — A419 Sepsis, unspecified organism: Secondary | ICD-10-CM | POA: Diagnosis not present

## 2020-03-25 ENCOUNTER — Other Ambulatory Visit: Payer: Self-pay

## 2020-03-25 ENCOUNTER — Other Ambulatory Visit: Payer: Self-pay | Admitting: Family Medicine

## 2020-03-25 MED ORDER — IRBESARTAN 300 MG PO TABS: 300.0000 mg | ORAL_TABLET | Freq: Every day | ORAL | 3 refills | Status: DC

## 2020-03-25 MED ORDER — POTASSIUM CHLORIDE CRYS ER 20 MEQ PO TBCR: 20.0000 meq | EXTENDED_RELEASE_TABLET | Freq: Every day | ORAL | 3 refills | Status: DC | PRN

## 2020-03-27 DIAGNOSIS — K810 Acute cholecystitis: Secondary | ICD-10-CM | POA: Diagnosis not present

## 2020-03-27 DIAGNOSIS — N3090 Cystitis, unspecified without hematuria: Secondary | ICD-10-CM | POA: Diagnosis not present

## 2020-03-27 DIAGNOSIS — I4891 Unspecified atrial fibrillation: Secondary | ICD-10-CM | POA: Diagnosis not present

## 2020-03-27 DIAGNOSIS — A419 Sepsis, unspecified organism: Secondary | ICD-10-CM | POA: Diagnosis not present

## 2020-03-27 DIAGNOSIS — G2581 Restless legs syndrome: Secondary | ICD-10-CM | POA: Diagnosis not present

## 2020-03-27 DIAGNOSIS — N390 Urinary tract infection, site not specified: Secondary | ICD-10-CM | POA: Diagnosis not present

## 2020-03-28 DIAGNOSIS — G2581 Restless legs syndrome: Secondary | ICD-10-CM | POA: Diagnosis not present

## 2020-03-28 DIAGNOSIS — N3090 Cystitis, unspecified without hematuria: Secondary | ICD-10-CM | POA: Diagnosis not present

## 2020-03-28 DIAGNOSIS — K810 Acute cholecystitis: Secondary | ICD-10-CM | POA: Diagnosis not present

## 2020-03-28 DIAGNOSIS — A419 Sepsis, unspecified organism: Secondary | ICD-10-CM | POA: Diagnosis not present

## 2020-03-28 DIAGNOSIS — N390 Urinary tract infection, site not specified: Secondary | ICD-10-CM | POA: Diagnosis not present

## 2020-03-28 DIAGNOSIS — I4891 Unspecified atrial fibrillation: Secondary | ICD-10-CM | POA: Diagnosis not present

## 2020-03-29 ENCOUNTER — Ambulatory Visit: Payer: Medicare Other | Attending: Internal Medicine

## 2020-03-29 DIAGNOSIS — Z23 Encounter for immunization: Secondary | ICD-10-CM

## 2020-03-29 NOTE — Progress Notes (Signed)
s  Covid-19 Vaccination Clinic  Name:  Cheryl Atkinson    MRN: [id number] DOB: 03/20/1931  03/29/2020  [name] was observed post Covid-19 immunization for 15 minutes without incident. She was provided with Vaccine Information Sheet and instruction to access the V-Safe system.   [name] was instructed to call 911 with any severe reactions post vaccine: Marland Kitchen Difficulty breathing  . Swelling of face and throat  . A fast heartbeat  . A bad rash all over body  . Dizziness and weakness   Immunizations Administered    Name Date Dose VIS Date Route   Pfizer COVID-19 Vaccine 03/29/2020  1:30 PM 0.3 mL 01/26/2019 Intramuscular   Manufacturer: [employer], [employer]   Lot: [id number]   NDC: [address]-1

## 2020-03-31 ENCOUNTER — Other Ambulatory Visit (HOSPITAL_COMMUNITY)
Admission: RE | Admit: 2020-03-31 | Discharge: 2020-03-31 | Disposition: A | Discharge status: Home / Self Care | Payer: Medicare Other | Source: Ambulatory Visit | Attending: General Surgery | Admitting: General Surgery

## 2020-03-31 DIAGNOSIS — Z01812 Encounter for preprocedural laboratory examination: Secondary | ICD-10-CM | POA: Insufficient documentation

## 2020-03-31 DIAGNOSIS — Z20822 Contact with and (suspected) exposure to covid-19: Secondary | ICD-10-CM | POA: Diagnosis not present

## 2020-04-01 LAB — SARS CORONAVIRUS 2 (TAT 6-24 HRS): SARS Coronavirus 2: NEGATIVE

## 2020-04-03 ENCOUNTER — Ambulatory Visit: Payer: Medicare Other | Admitting: Family Medicine

## 2020-04-03 ENCOUNTER — Encounter (HOSPITAL_COMMUNITY): Payer: Self-pay | Admitting: General Surgery

## 2020-04-03 ENCOUNTER — Ambulatory Visit: Payer: Self-pay | Admitting: General Surgery

## 2020-04-03 NOTE — Progress Notes (Signed)
Patient denies chest pain or shortness of breath. Cardiologist at [facility]. Reports she has been in quarantine since COVID test. Last dose of plavix 03/29/2020 last dose of eliquis 04/02/2020. Repeated back what clear liquids she could have until 0815 and no solids, gum, milk, or, candy after midnight.

## 2020-04-03 NOTE — Progress Notes (Signed)
Anesthesia Chart Review: Same day workup  She was hospitalized Jan/Feb 2021 where she was diagnosed with acute cholecystitis c/b bacteremia which was managed with a cholecystostomy tube at that time. She went to a rehab facility following that hospitalization where did poorly - became weaker and struggled with swelling in her legs. She was treated by her PCP Dr. Durene Cal for acute diastolic CHF and had 19 pound wight loss with diuresis with significant improvement in SOB and edema. Dr. Durene Cal referred pt to cardiology for preop eval.  Follows with cardiology for hx of PVD s/p right common iliac artery stenting in 2016 with subsequent balloon angioplasty to right SFA in 2016 (reoccluded on imaging 01/2019), paroxysmal atrial fibrillation on Eliquis, HTN, HLD. Seen by [name], PA-C 03/17/20 for preop clearance. Per note. "Preoperative assessment: patient is anticipating a cholecystectomy.  She is eager to get this done as the cholecystostomy tube is cumbersome and she is fearful of recurrent cholecystitis to due chronic gallstones. She cannot complete 4 METs due to weakness and claudication, though has no anginal complaints. Her revised cardiac risk index score is at least 2 (high risk procedure and CHF history, though likely has some degree of CAD given PAD history) with a 10.1% risk of adverse event in the perioperative setting. Recent echo with EF 60-65% with no RWMA. - Will route to Dr. Allyson Sabal for his input, though favor no further cardiac work-up prior to surgery. - Anticipate she will be cleared to hold plavix 5 days prior to her upcoming procedure - Eliquis is managed by her PCP - per Dr. Durene Cal, she has been cleared to hold eliquis 2 days prior to her upcoming surgery and restart 1 day after surgery if cleared to do so by her surgeon. - Will route to the requesting surgeons office once I hear back from Dr. Allyson Sabal."  Dr. Allyson Sabal commented stating he agrees with assessment and pt is cleared to proceed  with surgery without further cardiovascular testing.  Cardiology also recommended continue lasix 20 mg QOD with plans to take additional lasix for 3lb weight gain overnight or 5lbs in 1 week.   Will need DOS labs and eval.   EKG 03/24/11: Afib. Rate 89.  TTE 03/02/20: 1. Left ventricular ejection fraction, by estimation, is 60 to 65%. The  left ventricle has normal function. The left ventricle has no regional  wall motion abnormalities. There is mild left ventricular hypertrophy.  Left ventricular diastolic parameters  are indeterminate. Elevated left ventricular end-diastolic pressure.  2. Right ventricular systolic function is normal. The right ventricular  size is normal. There is mildly elevated pulmonary artery systolic  pressure.  3. Left atrial size was moderately dilated.  4. The mitral valve is degenerative. Moderate mitral valve regurgitation.  No evidence of mitral stenosis.  5. The aortic valve is abnormal. Aortic valve regurgitation is not  visualized. No aortic stenosis is present.  6. Aortic dilatation noted. There is mild dilatation of the ascending  aorta measuring 39 mm.  7. The inferior vena cava is normal in size with greater than 50%  respiratory variability, suggesting right atrial pressure of 3 mmHg.    Cheryl Atkinson [facility] Short Stay Center/Anesthesiology Phone [phone] 04/03/2020 3:10 PM

## 2020-04-03 NOTE — Anesthesia Preprocedure Evaluation (Addendum)
Anesthesia Evaluation  Patient identified by MRN, date of birth, ID band Patient awake    Reviewed: Allergy & Precautions, NPO status , Patient's Chart, lab work & pertinent test results  History of Anesthesia Complications Negative for: history of anesthetic complications  Airway Mallampati: II  TM Distance: >3 FB Neck ROM: Full    Dental  (+) Dental Advisory Given, Missing   Pulmonary COPD, former smoker (quit 2016),  03/31/2020 SARS coronavirus NEG   breath sounds clear to auscultation       Cardiovascular hypertension, Pt. on medications + Peripheral Vascular Disease and +CHF  + dysrhythmias Atrial Fibrillation  Rhythm:Irregular Rate:Normal  03/02/2020 ECHO: EF 60-65%, mod MR   Neuro/Psych Depression Chronic back pain, vertigo    GI/Hepatic Neg liver ROS, GERD  Controlled,  Endo/Other  negative endocrine ROS  Renal/GU negative Renal ROS     Musculoskeletal   Abdominal   Peds  Hematology eliquis   Anesthesia Other Findings   Reproductive/Obstetrics                         Anesthesia Physical Anesthesia Plan  ASA: III  Anesthesia Plan: General   Post-op Pain Management:    Induction: Intravenous  PONV Risk Score and Plan: 3 and Ondansetron, Dexamethasone and Treatment may vary due to age or medical condition  Airway Management Planned: Oral ETT  Additional Equipment:   Intra-op Plan:   Post-operative Plan: Extubation in OR  Informed Consent: I have reviewed the patients History and Physical, chart, labs and discussed the procedure including the risks, benefits and alternatives for the proposed anesthesia with the patient or authorized representative who has indicated his/her understanding and acceptance.     Dental advisory given  Plan Discussed with: CRNA and Surgeon  Anesthesia Plan Comments: (She was hospitalized Jan/Feb 2021 where she was diagnosed with acute  cholecystitis c/b bacteremia which was managed with a cholecystostomy tube at that time. She went to a rehab facility following that hospitalization where did poorly - became weaker and struggled with swelling in her legs. She was treated by her PCP Dr. Durene Cal for acute diastolic CHF and had 19 pound wight loss with diuresis with significant improvement in SOB and edema. Dr. Durene Cal referred pt to cardiology for preop eval.  Follows with cardiology for hx of PVD s/p right common iliac artery stenting in 2016 with subsequent balloon angioplasty to right SFA in 2016 (reoccluded on imaging 01/2019), paroxysmal atrial fibrillation on Eliquis, HTN, HLD. Seen by [name], PA-C 03/17/20 for preop clearance. Per note. "Preoperative assessment: patient is anticipating a cholecystectomy.  She is eager to get this done as the cholecystostomy tube is cumbersome and she is fearful of recurrent cholecystitis to due chronic gallstones. She cannot complete 4 METs due to weakness and claudication, though has no anginal complaints. Her revised cardiac risk index score is at least 2 (high risk procedure and CHF history, though likely has some degree of CAD given PAD history) with a 10.1% risk of adverse event in the perioperative setting. Recent echo with EF 60-65% with no RWMA. - Will route to Dr. Allyson Sabal for his input, though favor no further cardiac work-up prior to surgery. - Anticipate she will be cleared to hold plavix 5 days prior to her upcoming procedure - Eliquis is managed by her PCP - per Dr. Durene Cal, she has been cleared to hold eliquis 2 days prior to her upcoming surgery and restart 1 day after surgery if cleared  to do so by her surgeon. - Will route to the requesting surgeons office once I hear back from Dr. Gwenlyn Found."  Dr. Gwenlyn Found commented stating he agrees with assessment and pt is cleared to proceed with surgery without further cardiovascular testing.  Cardiology also recommended continue lasix 20 mg QOD with  plans to take additional lasix for 3lb weight gain overnight or 5lbs in 1 week.   Will need DOS labs and eval.   EKG 03/24/11: Afib. Rate 89.  TTE 03/02/20: 1. Left ventricular ejection fraction, by estimation, is 60 to 65%. The  left ventricle has normal function. The left ventricle has no regional  wall motion abnormalities. There is mild left ventricular hypertrophy.  Left ventricular diastolic parameters  are indeterminate. Elevated left ventricular end-diastolic pressure.  2. Right ventricular systolic function is normal. The right ventricular  size is normal. There is mildly elevated pulmonary artery systolic  pressure.  3. Left atrial size was moderately dilated.  4. The mitral valve is degenerative. Moderate mitral valve regurgitation.  No evidence of mitral stenosis.  5. The aortic valve is abnormal. Aortic valve regurgitation is not  visualized. No aortic stenosis is present.  6. Aortic dilatation noted. There is mild dilatation of the ascending  aorta measuring 39 mm.  7. The inferior vena cava is normal in size with greater than 50%  respiratory variability, suggesting right atrial pressure of 3 mmHg. )      Anesthesia Quick Evaluation

## 2020-04-04 ENCOUNTER — Other Ambulatory Visit: Payer: Self-pay

## 2020-04-04 ENCOUNTER — Encounter (HOSPITAL_COMMUNITY)
Admission: RE | Disposition: A | Discharge status: Home / Self Care | Payer: Self-pay | Source: Home / Self Care | Attending: General Surgery

## 2020-04-04 ENCOUNTER — Ambulatory Visit (HOSPITAL_COMMUNITY): Payer: Medicare Other | Admitting: Anesthesiology

## 2020-04-04 ENCOUNTER — Observation Stay (HOSPITAL_COMMUNITY)
Admission: RE | Admit: 2020-04-04 | Discharge: 2020-04-05 | Disposition: A | Discharge status: Home / Self Care | Payer: Medicare Other | Source: Home / Self Care | Attending: General Surgery | Admitting: General Surgery

## 2020-04-04 ENCOUNTER — Encounter (HOSPITAL_COMMUNITY): Payer: Self-pay | Admitting: General Surgery

## 2020-04-04 DIAGNOSIS — Z7902 Long term (current) use of antithrombotics/antiplatelets: Secondary | ICD-10-CM | POA: Insufficient documentation

## 2020-04-04 DIAGNOSIS — Z79899 Other long term (current) drug therapy: Secondary | ICD-10-CM | POA: Insufficient documentation

## 2020-04-04 DIAGNOSIS — I739 Peripheral vascular disease, unspecified: Secondary | ICD-10-CM | POA: Insufficient documentation

## 2020-04-04 DIAGNOSIS — Z9359 Other cystostomy status: Secondary | ICD-10-CM | POA: Insufficient documentation

## 2020-04-04 DIAGNOSIS — Z87891 Personal history of nicotine dependence: Secondary | ICD-10-CM | POA: Insufficient documentation

## 2020-04-04 DIAGNOSIS — K660 Peritoneal adhesions (postprocedural) (postinfection): Secondary | ICD-10-CM | POA: Insufficient documentation

## 2020-04-04 DIAGNOSIS — R188 Other ascites: Secondary | ICD-10-CM | POA: Diagnosis not present

## 2020-04-04 DIAGNOSIS — K8012 Calculus of gallbladder with acute and chronic cholecystitis without obstruction: Secondary | ICD-10-CM | POA: Insufficient documentation

## 2020-04-04 DIAGNOSIS — K750 Abscess of liver: Secondary | ICD-10-CM | POA: Diagnosis not present

## 2020-04-04 DIAGNOSIS — G2581 Restless legs syndrome: Secondary | ICD-10-CM | POA: Insufficient documentation

## 2020-04-04 DIAGNOSIS — I110 Hypertensive heart disease with heart failure: Secondary | ICD-10-CM | POA: Insufficient documentation

## 2020-04-04 DIAGNOSIS — K819 Cholecystitis, unspecified: Secondary | ICD-10-CM | POA: Insufficient documentation

## 2020-04-04 DIAGNOSIS — Z9049 Acquired absence of other specified parts of digestive tract: Secondary | ICD-10-CM

## 2020-04-04 DIAGNOSIS — J9602 Acute respiratory failure with hypercapnia: Secondary | ICD-10-CM | POA: Diagnosis not present

## 2020-04-04 DIAGNOSIS — Z7901 Long term (current) use of anticoagulants: Secondary | ICD-10-CM | POA: Insufficient documentation

## 2020-04-04 DIAGNOSIS — F329 Major depressive disorder, single episode, unspecified: Secondary | ICD-10-CM | POA: Insufficient documentation

## 2020-04-04 DIAGNOSIS — K219 Gastro-esophageal reflux disease without esophagitis: Secondary | ICD-10-CM | POA: Insufficient documentation

## 2020-04-04 DIAGNOSIS — K8013 Calculus of gallbladder with acute and chronic cholecystitis with obstruction: Secondary | ICD-10-CM | POA: Diagnosis not present

## 2020-04-04 DIAGNOSIS — I5032 Chronic diastolic (congestive) heart failure: Secondary | ICD-10-CM | POA: Diagnosis not present

## 2020-04-04 DIAGNOSIS — I509 Heart failure, unspecified: Secondary | ICD-10-CM | POA: Insufficient documentation

## 2020-04-04 DIAGNOSIS — J449 Chronic obstructive pulmonary disease, unspecified: Secondary | ICD-10-CM | POA: Insufficient documentation

## 2020-04-04 DIAGNOSIS — I4891 Unspecified atrial fibrillation: Secondary | ICD-10-CM | POA: Insufficient documentation

## 2020-04-04 DIAGNOSIS — G92 Toxic encephalopathy: Secondary | ICD-10-CM | POA: Diagnosis not present

## 2020-04-04 DIAGNOSIS — J9622 Acute and chronic respiratory failure with hypercapnia: Secondary | ICD-10-CM | POA: Diagnosis not present

## 2020-04-04 DIAGNOSIS — R531 Weakness: Secondary | ICD-10-CM | POA: Diagnosis not present

## 2020-04-04 DIAGNOSIS — E872 Acidosis: Secondary | ICD-10-CM | POA: Diagnosis not present

## 2020-04-04 DIAGNOSIS — E876 Hypokalemia: Secondary | ICD-10-CM | POA: Diagnosis not present

## 2020-04-04 DIAGNOSIS — Z66 Do not resuscitate: Secondary | ICD-10-CM | POA: Diagnosis not present

## 2020-04-04 DIAGNOSIS — I10 Essential (primary) hypertension: Secondary | ICD-10-CM | POA: Diagnosis not present

## 2020-04-04 DIAGNOSIS — J9621 Acute and chronic respiratory failure with hypoxia: Secondary | ICD-10-CM | POA: Diagnosis not present

## 2020-04-04 DIAGNOSIS — K811 Chronic cholecystitis: Secondary | ICD-10-CM | POA: Diagnosis not present

## 2020-04-04 DIAGNOSIS — E785 Hyperlipidemia, unspecified: Secondary | ICD-10-CM | POA: Insufficient documentation

## 2020-04-04 DIAGNOSIS — J9601 Acute respiratory failure with hypoxia: Secondary | ICD-10-CM | POA: Diagnosis not present

## 2020-04-04 DIAGNOSIS — N179 Acute kidney failure, unspecified: Secondary | ICD-10-CM | POA: Diagnosis not present

## 2020-04-04 DIAGNOSIS — Z20822 Contact with and (suspected) exposure to covid-19: Secondary | ICD-10-CM | POA: Diagnosis not present

## 2020-04-04 DIAGNOSIS — I4819 Other persistent atrial fibrillation: Secondary | ICD-10-CM | POA: Diagnosis not present

## 2020-04-04 DIAGNOSIS — K8066 Calculus of gallbladder and bile duct with acute and chronic cholecystitis without obstruction: Secondary | ICD-10-CM | POA: Diagnosis not present

## 2020-04-04 HISTORY — DX: Heart failure, unspecified: I50.9

## 2020-04-04 HISTORY — PX: CHOLECYSTECTOMY: SHX55

## 2020-04-04 HISTORY — DX: Hyperlipidemia, unspecified: E78.5

## 2020-04-04 HISTORY — DX: Insomnia, unspecified: G47.00

## 2020-04-04 HISTORY — DX: Chronic obstructive pulmonary disease, unspecified: J44.9

## 2020-04-04 LAB — COMPREHENSIVE METABOLIC PANEL
ALT: 14 U/L (ref 0–44)
AST: 16 U/L (ref 15–41)
Albumin: 3.8 g/dL (ref 3.5–5.0)
Alkaline Phosphatase: 87 U/L (ref 38–126)
Anion gap: 9 (ref 5–15)
BUN: 27 mg/dL — ABNORMAL HIGH (ref 8–23)
CO2: 22 mmol/L (ref 22–32)
Calcium: 9.2 mg/dL (ref 8.9–10.3)
Chloride: 108 mmol/L (ref 98–111)
Creatinine, Ser: 0.76 mg/dL (ref 0.44–1.00)
GFR calc Af Amer: >60 mL/min (ref >60)
GFR calc non Af Amer: >60 mL/min (ref >60)
Glucose, Bld: 100 mg/dL — ABNORMAL HIGH (ref 70–99)
Potassium: 4.2 mmol/L (ref 3.5–5.1)
Sodium: 139 mmol/L (ref 135–145)
Total Bilirubin: 0.5 mg/dL (ref 0.3–1.2)
Total Protein: 7.4 g/dL (ref 6.5–8.1)

## 2020-04-04 LAB — CBC
HCT: 37.2 % (ref 36.0–46.0)
Hemoglobin: 11.5 g/dL — ABNORMAL LOW (ref 12.0–15.0)
MCH: 27.5 pg (ref 26.0–34.0)
MCHC: 30.9 g/dL (ref 30.0–36.0)
MCV: 89 fL (ref 80.0–100.0)
Platelets: 237 10*3/uL (ref 150–400)
RBC: 4.18 MIL/uL (ref 3.87–5.11)
RDW: 14.2 % (ref 11.5–15.5)
WBC: 5.7 10*3/uL (ref 4.0–10.5)
nRBC: 0 % (ref 0.0–0.2)

## 2020-04-04 LAB — PROTIME-INR
INR: 1.1 (ref 0.8–1.2)
Prothrombin Time: 13.6 seconds (ref 11.4–15.2)

## 2020-04-04 SURGERY — LAPAROSCOPIC CHOLECYSTECTOMY
Anesthesia: General | Site: Abdomen

## 2020-04-04 MED ORDER — ONDANSETRON HCL 4 MG/2ML IJ SOLN
INTRAMUSCULAR | Status: AC
  Filled 2020-04-04: qty 2

## 2020-04-04 MED ORDER — HYDROMORPHONE HCL 1 MG/ML IJ SOLN
INTRAMUSCULAR | Status: AC
  Filled 2020-04-04: qty 1

## 2020-04-04 MED ORDER — GABAPENTIN 300 MG PO CAPS: 300.0000 mg | ORAL_CAPSULE | ORAL | Status: DC

## 2020-04-04 MED ORDER — ACETAMINOPHEN 500 MG PO TABS
ORAL_TABLET | ORAL | Status: AC
  Administered 2020-04-04: 1000 mg
  Filled 2020-04-04: qty 2

## 2020-04-04 MED ORDER — DIPHENHYDRAMINE HCL 12.5 MG/5ML PO ELIX: 12.5000 mg | ORAL_SOLUTION | Freq: Four times a day (QID) | ORAL | Status: DC | PRN

## 2020-04-04 MED ORDER — ONDANSETRON HCL 4 MG/2ML IJ SOLN
INTRAMUSCULAR | Status: DC | PRN
  Administered 2020-04-04: 4 mg via INTRAVENOUS

## 2020-04-04 MED ORDER — LIDOCAINE 2% (20 MG/ML) 5 ML SYRINGE
INTRAMUSCULAR | Status: DC | PRN
  Administered 2020-04-04: 30 mg via INTRAVENOUS

## 2020-04-04 MED ORDER — POTASSIUM CHLORIDE IN NACL 20-0.9 MEQ/L-% IV SOLN
INTRAVENOUS | Status: DC
  Filled 2020-04-04: qty 1000

## 2020-04-04 MED ORDER — FENTANYL CITRATE (PF) 250 MCG/5ML IJ SOLN
INTRAMUSCULAR | Status: AC
  Filled 2020-04-04: qty 5

## 2020-04-04 MED ORDER — ONDANSETRON HCL 4 MG/2ML IJ SOLN: 4.0000 mg | Freq: Four times a day (QID) | INTRAMUSCULAR | Status: DC | PRN

## 2020-04-04 MED ORDER — IRBESARTAN 300 MG PO TABS
300.0000 mg | ORAL_TABLET | Freq: Every day | ORAL | Status: DC
  Administered 2020-04-04 – 2020-04-05 (×2): 300 mg via ORAL
  Filled 2020-04-04 (×2): qty 1

## 2020-04-04 MED ORDER — GABAPENTIN 300 MG PO CAPS
ORAL_CAPSULE | ORAL | Status: AC
  Administered 2020-04-04: 300 mg
  Filled 2020-04-04: qty 1

## 2020-04-04 MED ORDER — SODIUM CHLORIDE 0.9 % IR SOLN
Status: DC | PRN
  Administered 2020-04-04: 1000 mL

## 2020-04-04 MED ORDER — SUGAMMADEX SODIUM 200 MG/2ML IV SOLN
INTRAVENOUS | Status: DC | PRN
  Administered 2020-04-04: 150 mg via INTRAVENOUS

## 2020-04-04 MED ORDER — ACETAMINOPHEN 500 MG PO TABS: 1000.0000 mg | ORAL_TABLET | ORAL | Status: DC

## 2020-04-04 MED ORDER — HYDROMORPHONE HCL 1 MG/ML IJ SOLN
0.2500 mg | INTRAMUSCULAR | Status: DC | PRN
  Administered 2020-04-04 (×4): 0.5 mg via INTRAVENOUS

## 2020-04-04 MED ORDER — FENTANYL CITRATE (PF) 100 MCG/2ML IJ SOLN
INTRAMUSCULAR | Status: DC | PRN
  Administered 2020-04-04: 25 ug via INTRAVENOUS
  Administered 2020-04-04: 100 ug via INTRAVENOUS

## 2020-04-04 MED ORDER — LIDOCAINE 2% (20 MG/ML) 5 ML SYRINGE
INTRAMUSCULAR | Status: AC
  Filled 2020-04-04: qty 5

## 2020-04-04 MED ORDER — CEFAZOLIN SODIUM-DEXTROSE 2-4 GM/100ML-% IV SOLN
INTRAVENOUS | Status: AC
  Filled 2020-04-04: qty 100

## 2020-04-04 MED ORDER — TRAZODONE HCL 150 MG PO TABS: 75.0000 mg | ORAL_TABLET | Freq: Every evening | ORAL | Status: DC | PRN

## 2020-04-04 MED ORDER — MIDAZOLAM HCL 2 MG/2ML IJ SOLN: 0.5000 mg | Freq: Once | INTRAMUSCULAR | Status: DC | PRN

## 2020-04-04 MED ORDER — LACTATED RINGERS IV SOLN: INTRAVENOUS | Status: DC

## 2020-04-04 MED ORDER — MEPERIDINE HCL 25 MG/ML IJ SOLN: 6.2500 mg | INTRAMUSCULAR | Status: DC | PRN

## 2020-04-04 MED ORDER — HYDRALAZINE HCL 10 MG PO TABS: 10.0000 mg | ORAL_TABLET | Freq: Four times a day (QID) | ORAL | Status: DC | PRN

## 2020-04-04 MED ORDER — ONDANSETRON 4 MG PO TBDP: 4.0000 mg | ORAL_TABLET | Freq: Four times a day (QID) | ORAL | Status: DC | PRN

## 2020-04-04 MED ORDER — TRAMADOL HCL 50 MG PO TABS
50.0000 mg | ORAL_TABLET | Freq: Every day | ORAL | Status: DC | PRN
  Administered 2020-04-05: 50 mg via ORAL
  Filled 2020-04-04: qty 1

## 2020-04-04 MED ORDER — PHENYLEPHRINE 40 MCG/ML (10ML) SYRINGE FOR IV PUSH (FOR BLOOD PRESSURE SUPPORT)
PREFILLED_SYRINGE | INTRAVENOUS | Status: DC | PRN
  Administered 2020-04-04: 80 ug via INTRAVENOUS

## 2020-04-04 MED ORDER — DIPHENHYDRAMINE HCL 50 MG/ML IJ SOLN: 12.5000 mg | Freq: Four times a day (QID) | INTRAMUSCULAR | Status: DC | PRN

## 2020-04-04 MED ORDER — ROCURONIUM BROMIDE 10 MG/ML (PF) SYRINGE
PREFILLED_SYRINGE | INTRAVENOUS | Status: AC
  Filled 2020-04-04: qty 10

## 2020-04-04 MED ORDER — FUROSEMIDE 40 MG PO TABS
40.0000 mg | ORAL_TABLET | ORAL | Status: DC
  Administered 2020-04-04: 40 mg via ORAL
  Filled 2020-04-04: qty 1

## 2020-04-04 MED ORDER — BUPIVACAINE-EPINEPHRINE 0.25% -1:200000 IJ SOLN
INTRAMUSCULAR | Status: DC | PRN
  Administered 2020-04-04: 20 mL

## 2020-04-04 MED ORDER — 0.9 % SODIUM CHLORIDE (POUR BTL) OPTIME
TOPICAL | Status: DC | PRN
  Administered 2020-04-04: 1000 mL

## 2020-04-04 MED ORDER — DEXAMETHASONE SODIUM PHOSPHATE 10 MG/ML IJ SOLN
INTRAMUSCULAR | Status: AC
  Filled 2020-04-04: qty 1

## 2020-04-04 MED ORDER — HEMOSTATIC AGENTS (NO CHARGE) OPTIME
TOPICAL | Status: DC | PRN
  Administered 2020-04-04 (×2): 1 via TOPICAL

## 2020-04-04 MED ORDER — ENSURE PRE-SURGERY PO LIQD: 296.0000 mL | Freq: Once | ORAL | Status: DC

## 2020-04-04 MED ORDER — PHENYLEPHRINE 40 MCG/ML (10ML) SYRINGE FOR IV PUSH (FOR BLOOD PRESSURE SUPPORT)
PREFILLED_SYRINGE | INTRAVENOUS | Status: AC
  Filled 2020-04-04: qty 10

## 2020-04-04 MED ORDER — ALBUTEROL SULFATE (2.5 MG/3ML) 0.083% IN NEBU: 2.5000 mg | INHALATION_SOLUTION | Freq: Four times a day (QID) | RESPIRATORY_TRACT | Status: DC | PRN

## 2020-04-04 MED ORDER — ROCURONIUM BROMIDE 10 MG/ML (PF) SYRINGE
PREFILLED_SYRINGE | INTRAVENOUS | Status: DC | PRN
  Administered 2020-04-04: 50 mg via INTRAVENOUS

## 2020-04-04 MED ORDER — PROMETHAZINE HCL 25 MG/ML IJ SOLN: 6.2500 mg | INTRAMUSCULAR | Status: DC | PRN

## 2020-04-04 MED ORDER — CHLORHEXIDINE GLUCONATE CLOTH 2 % EX PADS: 6.0000 | MEDICATED_PAD | Freq: Once | CUTANEOUS | Status: DC

## 2020-04-04 MED ORDER — PROPOFOL 10 MG/ML IV BOLUS
INTRAVENOUS | Status: AC
  Filled 2020-04-04: qty 20

## 2020-04-04 MED ORDER — MORPHINE SULFATE (PF) 4 MG/ML IV SOLN
4.0000 mg | INTRAVENOUS | Status: DC | PRN
  Administered 2020-04-04: 4 mg via INTRAVENOUS
  Filled 2020-04-04: qty 1

## 2020-04-04 MED ORDER — DEXAMETHASONE SODIUM PHOSPHATE 10 MG/ML IJ SOLN
INTRAMUSCULAR | Status: DC | PRN
  Administered 2020-04-04: 5 mg via INTRAVENOUS

## 2020-04-04 MED ORDER — CEFAZOLIN SODIUM-DEXTROSE 2-4 GM/100ML-% IV SOLN
2.0000 g | INTRAVENOUS | Status: AC
  Administered 2020-04-04: 2 g via INTRAVENOUS

## 2020-04-04 MED ORDER — ENOXAPARIN SODIUM 40 MG/0.4ML ~~LOC~~ SOLN
40.0000 mg | SUBCUTANEOUS | Status: DC
  Administered 2020-04-05: 40 mg via SUBCUTANEOUS
  Filled 2020-04-04: qty 0.4

## 2020-04-04 MED ORDER — PROPOFOL 10 MG/ML IV BOLUS
INTRAVENOUS | Status: DC | PRN
  Administered 2020-04-04: 70 mg via INTRAVENOUS

## 2020-04-04 MED ORDER — POTASSIUM CHLORIDE CRYS ER 20 MEQ PO TBCR
20.0000 meq | EXTENDED_RELEASE_TABLET | ORAL | Status: DC
  Administered 2020-04-04: 20 meq via ORAL
  Filled 2020-04-04: qty 1

## 2020-04-04 SURGICAL SUPPLY — 45 items
ADH SKN CLS APL DERMABOND .7 (GAUZE/BANDAGES/DRESSINGS) ×1
APL PRP STRL LF DISP 70% ISPRP (MISCELLANEOUS) ×1
APPLIER CLIP 5 13 M/L LIGAMAX5 (MISCELLANEOUS) ×2
APR CLP MED LRG 5 ANG JAW (MISCELLANEOUS) ×1
BAG SPEC RTRVL 10 TROC 200 (ENDOMECHANICALS) ×1
BLADE CLIPPER SURG (BLADE) IMPLANT
CANISTER SUCT 3000ML PPV (MISCELLANEOUS) ×2 IMPLANT
CHLORAPREP W/TINT 26 (MISCELLANEOUS) ×2 IMPLANT
CLIP APPLIE 5 13 M/L LIGAMAX5 (MISCELLANEOUS) ×1 IMPLANT
COVER SURGICAL LIGHT HANDLE (MISCELLANEOUS) ×2 IMPLANT
COVER WAND RF STERILE (DRAPES) ×2 IMPLANT
DERMABOND ADVANCED (GAUZE/BANDAGES/DRESSINGS) ×1
DERMABOND ADVANCED .7 DNX12 (GAUZE/BANDAGES/DRESSINGS) ×1 IMPLANT
ELECT REM PT RETURN 9FT ADLT (ELECTROSURGICAL) ×2
ELECTRODE REM PT RTRN 9FT ADLT (ELECTROSURGICAL) ×1 IMPLANT
GLOVE BIO SURGEON STRL SZ8 (GLOVE) ×2 IMPLANT
GLOVE BIOGEL PI IND STRL 8 (GLOVE) ×1 IMPLANT
GLOVE BIOGEL PI INDICATOR 8 (GLOVE) ×1
GOWN STRL REUS W/ TWL LRG LVL3 (GOWN DISPOSABLE) ×2 IMPLANT
GOWN STRL REUS W/ TWL XL LVL3 (GOWN DISPOSABLE) ×1 IMPLANT
GOWN STRL REUS W/TWL LRG LVL3 (GOWN DISPOSABLE) ×4
GOWN STRL REUS W/TWL XL LVL3 (GOWN DISPOSABLE) ×4
KIT BASIN OR (CUSTOM PROCEDURE TRAY) ×2 IMPLANT
KIT TURNOVER KIT B (KITS) ×2 IMPLANT
L-HOOK LAP DISP 36CM (ELECTROSURGICAL) ×2
LHOOK LAP DISP 36CM (ELECTROSURGICAL) ×1 IMPLANT
NEEDLE 22X1 1/2 (OR ONLY) (NEEDLE) ×2 IMPLANT
NS IRRIG 1000ML POUR BTL (IV SOLUTION) ×2 IMPLANT
PAD ARMBOARD 7.5X6 YLW CONV (MISCELLANEOUS) ×2 IMPLANT
PENCIL BUTTON HOLSTER BLD 10FT (ELECTRODE) ×2 IMPLANT
POUCH RETRIEVAL ECOSAC 10 (ENDOMECHANICALS) ×1 IMPLANT
POUCH RETRIEVAL ECOSAC 10MM (ENDOMECHANICALS) ×2
SCISSORS LAP 5X35 DISP (ENDOMECHANICALS) ×2 IMPLANT
SET IRRIG TUBING LAPAROSCOPIC (IRRIGATION / IRRIGATOR) ×2 IMPLANT
SET TUBE SMOKE EVAC HIGH FLOW (TUBING) ×2 IMPLANT
SLEEVE ENDOPATH XCEL 5M (ENDOMECHANICALS) ×4 IMPLANT
SPECIMEN JAR SMALL (MISCELLANEOUS) ×2 IMPLANT
SUT VIC AB 4-0 PS2 27 (SUTURE) ×2 IMPLANT
TOWEL GREEN STERILE (TOWEL DISPOSABLE) ×2 IMPLANT
TOWEL GREEN STERILE FF (TOWEL DISPOSABLE) ×2 IMPLANT
TRAY LAPAROSCOPIC MC (CUSTOM PROCEDURE TRAY) ×2 IMPLANT
TROCAR BLADELESS 11MM (ENDOMECHANICALS) ×1 IMPLANT
TROCAR XCEL BLUNT TIP 100MML (ENDOMECHANICALS) ×2 IMPLANT
TROCAR XCEL NON-BLD 5MMX100MML (ENDOMECHANICALS) ×2 IMPLANT
WATER STERILE IRR 1000ML POUR (IV SOLUTION) ×1 IMPLANT

## 2020-04-04 NOTE — Plan of Care (Signed)

## 2020-04-04 NOTE — Anesthesia Postprocedure Evaluation (Signed)
Anesthesia Post Note  Patient: Cheryl Atkinson  Procedure(s) Performed: LAPAROSCOPIC CHOLECYSTECTOMY (N/A Abdomen)     Patient location during evaluation: PACU Anesthesia Type: General Level of consciousness: awake and alert, oriented and patient cooperative Pain management: pain level controlled Vital Signs Assessment: post-procedure vital signs reviewed and stable Respiratory status: spontaneous breathing, nonlabored ventilation, respiratory function stable and patient connected to nasal cannula oxygen Cardiovascular status: blood pressure returned to baseline and stable Postop Assessment: no apparent nausea or vomiting Anesthetic complications: no    Last Vitals:  Vitals:   04/04/20 1515 04/04/20 1525  BP:  (!) 142/70  Pulse: 84 80  Resp: 16 17  Temp: 37.6 C 36.6 C  SpO2: 98% 100%    Last Pain:  Vitals:   04/04/20 1525  TempSrc: Oral  PainSc:                  [place],E. Liona Wengert

## 2020-04-04 NOTE — H&P (Signed)
Cheryl Atkinson is an 86 y.o. female.   Chief Complaint: cholecystitis HPI: S/P percutaneous cholecystostomy tube. Now presents for laparoscopic cholecystectomy.  She underwent preoperative clearance by her cardiologist, Dr. Allyson Sabal.  She has held her Plavix and Eliquis.  Past Medical History:  Diagnosis Date  . Anemia   . Arthritis    "shoulders" (05/04/2015)  . Cellulitis of right lower extremity 11/27/2013  . CHF (congestive heart failure) (HCC)   . Chronic lower back pain   . Constipation   . COPD (chronic obstructive pulmonary disease) (HCC)   . GERD (gastroesophageal reflux disease)   . History of hiatal hernia   . HLD (hyperlipidemia)   . Hypertension   . Insomnia   . Osteoporosis   . Peripheral arterial disease (HCC)   . Restless leg syndrome   . Thyroid disease     Past Surgical History:  Procedure Laterality Date  . ABDOMINAL AORTAGRAM  05/04/2015   Procedure: Abdominal Aortagram;  Surgeon: Runell Gess, MD;  Location[facility] INVASIVE CV LAB;  Service: Cardiovascular;;  . APPENDECTOMY    . CATARACT EXTRACTION W/ INTRAOCULAR LENS  IMPLANT, BILATERAL Bilateral   . I & D EXTREMITY Right 12/22/2016   Procedure: IRRIGATION AND DEBRIDEMENT EXTREMITY;  Surgeon: Mack Hook, MD;  Location: [facility] OR;  Service: Orthopedics;  Laterality: Right;  . IR EXCHANGE BILIARY DRAIN  03/13/2020  . IR PATIENT EVAL TECH 0-60 MINS  12/30/2019  . IR PERC CHOLECYSTOSTOMY  01/27/2020  . PERIPHERAL VASCULAR CATHETERIZATION N/A 05/04/2015   Procedure: Lower Extremity Angiography;  Surgeon: Runell Gess, MD;  Location: [facility] INVASIVE CV LAB;  Service: Cardiovascular;  Laterality: N/A;  . PERIPHERAL VASCULAR CATHETERIZATION  05/04/2015   Procedure: Peripheral Vascular Intervention;  Surgeon: Runell Gess, MD;  Location: [facility] INVASIVE CV LAB;  Service: Cardiovascular;;  RCIA - 7x22 ICAST  . PERIPHERAL VASCULAR CATHETERIZATION Right 09/04/2015   Procedure: Peripheral Vascular Atherectomy;  Surgeon: Runell Gess, MD;  Location: [facility] INVASIVE CV LAB;  Service: Cardiovascular;  Laterality: Right;  SFA  . sfa Right 09/04/2015   de balloon  . THYROID SURGERY Right ?2013   "had goiter taken off my neck"  . TONSILLECTOMY    . VAGINAL HYSTERECTOMY      Family History  Problem Relation Age of Onset  . Heart disease Father   . Heart attack Father   . Heart disease Mother   . Coronary artery disease Other   . Atrial fibrillation Sister   . Stroke Maternal Grandmother   . Cancer Paternal Grandmother    Social History:  reports that she quit smoking about 5 years ago. Her smoking use included cigarettes. She has a 30.00 pack-year smoking history. She has never used smokeless tobacco. She reports that she does not drink alcohol or use drugs.  Allergies:  Allergies  Allergen Reactions  . Codeine Other (See Comments)    REACTION: Syncope     Medications Prior to Admission  Medication Sig Dispense Refill  . apixaban (ELIQUIS) 5 MG TABS tablet Take 1 tablet (5 mg total) by mouth 2 (two) times daily. 60 tablet 11  . atorvastatin (LIPITOR) 40 MG tablet Take 1 tablet (40 mg total) by mouth daily. 90 tablet 3  . clopidogrel (PLAVIX) 75 MG tablet Take 1 tablet (75 mg total) by mouth daily. 30 tablet 0  . furosemide (LASIX) 40 MG tablet Take 40 mg by mouth every other day.     . irbesartan (AVAPRO) 300 MG tablet Take  1 tablet (300 mg total) by mouth daily. 90 tablet 3  . Multiple Vitamins-Minerals (PRESERVISION AREDS 2 PO) Take 1 tablet by mouth 2 (two) times daily.    Marland Kitchen NEUPRO 3 MG/24HR PT24 PLACE 1 PATCH (3 MG) ONTO THE SKIN AT BEDTIME (Patient taking differently: Apply 1 patch topically at bedtime. Take off in the morning) 90 patch 3  . polyethylene glycol (MIRALAX / GLYCOLAX) 17 g packet Take 17 g by mouth 2 (two) times daily. Reported on 03/14/2016 (Patient taking differently: Take 17 g by mouth daily as needed for mild constipation. )    . potassium chloride SA (KLOR-CON) 20 MEQ tablet Take 1 tablet  (20 mEq total) by mouth daily as needed (take on days that you take lasix). (Patient taking differently: Take 20 mEq by mouth every other day. ) 30 tablet 3  . traZODone (DESYREL) 50 MG tablet Take 1.5 tablets (75 mg total) by mouth at bedtime as needed for sleep. (Patient taking differently: Take 50 mg by mouth at bedtime. ) 45 tablet 1  . Albuterol Sulfate (PROAIR RESPICLICK) 108 (90 Base) MCG/ACT AEPB Inhale 2 puffs into the lungs every 6 (six) hours as needed (shortness of breath from COPD). 1 each 5  . furosemide (LASIX) 20 MG tablet Take 1 tablet (20 mg total) by mouth daily. (Patient not taking: Reported on 03/31/2020) 30 tablet 5  . sodium chloride flush (NS) 0.9 % SOLN 5 mLs by Intracatheter route every 8 (eight) hours.    . traMADol (ULTRAM) 50 MG tablet Take 50 mg by mouth daily as needed for moderate pain.       Results for orders placed or performed during the hospital encounter of 04/04/20 (from the past 48 hour(s))  Comprehensive metabolic panel     Status: Abnormal   Collection Time: 04/04/20  8:47 AM  Result Value Ref Range   Sodium 139 135 - 145 mmol/L   Potassium 4.2 3.5 - 5.1 mmol/L   Chloride 108 98 - 111 mmol/L   CO2 22 22 - 32 mmol/L   Glucose, Bld 100 (H) 70 - 99 mg/dL    Comment: Glucose reference range applies only to samples taken after fasting for at least 8 hours.   BUN 27 (H) 8 - 23 mg/dL   Creatinine, Ser 5.45 0.44 - 1.00 mg/dL   Calcium 9.2 8.9 - 62.5 mg/dL   Total Protein 7.4 6.5 - 8.1 g/dL   Albumin 3.8 3.5 - 5.0 g/dL   AST 16 15 - 41 U/L   ALT 14 0 - 44 U/L   Alkaline Phosphatase 87 38 - 126 U/L   Total Bilirubin 0.5 0.3 - 1.2 mg/dL   GFR calc non Af Amer >60 >60 mL/min   GFR calc Af Amer >60 >60 mL/min   Anion gap 9 5 - 15    Comment: Performed at [facility], [address], [place], [place] [address]  CBC     Status: Abnormal   Collection Time: 04/04/20  8:47 AM  Result Value Ref Range   WBC 5.7 4.0 - 10.5 K/uL   RBC 4.18 3.87 - 5.11  MIL/uL   Hemoglobin 11.5 (L) 12.0 - 15.0 g/dL   HCT 73.4 28.7 - 68.1 %   MCV 89.0 80.0 - 100.0 fL   MCH 27.5 26.0 - 34.0 pg   MCHC 30.9 30.0 - 36.0 g/dL   RDW 15.7 26.2 - 03.5 %   Platelets 237 150 - 400 K/uL   nRBC  0.0 0.0 - 0.2 %    Comment: Performed at [facility], [place] [address] [place], [place] [address]  PT- INR Day of Surgery     Status: None   Collection Time: 04/04/20  8:47 AM  Result Value Ref Range   Prothrombin Time 13.6 11.4 - 15.2 seconds   INR 1.1 0.8 - 1.2    Comment: (NOTE) INR goal varies based on device and disease states. Performed at [facility], [place] [address] [place], [place] [address]    No results found.  Review of Systems  Blood pressure (!) 188/87, pulse 86, temperature 98.2 F (36.8 C), resp. rate 17, height 5\' 6"  (1.676 m), weight 63.5 kg, SpO2 100 %. Physical Exam  Constitutional: She is oriented to person, place, and time. She appears well-developed and well-nourished.  HENT:  Head: Normocephalic.  Eyes: Pupils are equal, round, and reactive to light.  Neck: No thyromegaly present.  Cardiovascular: Normal rate and normal heart sounds.  Respiratory: Effort normal and breath sounds normal. No respiratory distress.  GI: Soft. She exhibits no distension. There is no abdominal tenderness.  Percutaneous cholecystostomy tube draining bile  Musculoskeletal:        General: Normal range of motion.     Cervical back: Neck supple.  Neurological: She is alert and oriented to person, place, and time.  Psychiatric: She has a normal mood and affect.     Assessment/Plan Cholecystitis -percutaneous cholecystostomy tube in place.  Now presents for laparoscopic cholecystectomy.  Procedure, risks, and benefits were discussed in detail with her.  We will plan overnight observation.  I discussed the expected postoperative course and she is agreeable.  Zenovia Jarred, MD 04/04/2020, 10:36 AM

## 2020-04-04 NOTE — Transfer of Care (Signed)
Immediate Anesthesia Transfer of Care Note  Patient: Cheryl Atkinson  Procedure(s) Performed: LAPAROSCOPIC CHOLECYSTECTOMY (N/A Abdomen)  Patient Location: PACU  Anesthesia Type:General  Level of Consciousness: awake, oriented and patient cooperative  Airway & Oxygen Therapy: Patient Spontanous Breathing and Patient connected to nasal cannula oxygen  Post-op Assessment: Report given to RN and Post -op Vital signs reviewed and stable  Post vital signs: Reviewed  Last Vitals:  Vitals Value Taken Time  BP 140/68 04/04/20 1325  Temp    Pulse 76 04/04/20 1330  Resp 21 04/04/20 1330  SpO2 100 % 04/04/20 1330  Vitals shown include unvalidated device data.  Last Pain:  Vitals:   04/04/20 0912  PainSc: 0-No pain      Patients Stated Pain Goal: 4 (04/04/20 0912)  Complications: No apparent anesthesia complications

## 2020-04-04 NOTE — Op Note (Signed)
04/04/2020  12:56 PM  PATIENT:  Cheryl Atkinson  86 y.o. female  PRE-OPERATIVE DIAGNOSIS:  CHOLECYSTITIS  POST-OPERATIVE DIAGNOSIS:  CHOLECYSTITIS  PROCEDURE:  Procedure(s): LAPAROSCOPIC LYSIS OF ADHESIONS LAPAROSCOPIC CHOLECYSTECTOMY  SURGEON:  Violeta Gelinas, MD  ASSISTANTS: Darnell Level, MD  ANESTHESIA:   local and general  EBL:  Total I/O In: 100 [IV Piggyback:100] Out: 20 [Blood:20]  BLOOD ADMINISTERED:none  DRAINS: none   SPECIMEN:  Excision  DISPOSITION OF SPECIMEN:  PATHOLOGY  COUNTS:  YES  DICTATION: .Dragon Dictation Findings: Chronic cholecystitis with multiple adhesions  Procedure in detail: Informed consent was obtained.  She received intravenous antibiotics.  She was brought to the operating room and general endotracheal anesthesia was administered by the anesthesia staff.  Her abdomen was prepped and draped in sterile fashion.  We did a timeout procedure.The infraumbilical region was infiltrated with local. Infraumbilical incision was made. Subcutaneous tissues were dissected down revealing the anterior fascia. This was divided sharply along the midline. Peritoneal cavity was entered under direct vision without complication. A 0 Vicryl pursestring was placed around the fascial opening. Hassan trocar was inserted into the abdomen. The abdomen was insufflated with carbon dioxide in standard fashion. Under direct vision a 5 mm epigastric and 5 mm right port x2 were placed.  Local was used at each port site.  Laparoscopic exploration revealed her percutaneous cholecystostomy tube going directly into the gallbladder, not traversing the liver.  There were a lot of adhesions up to the anterior abdominal wall here.  I initially took some of those down.  We then removed the cholecystostomy tube and that was passed off.  The gallbladder had a lot of omental adhesions to the anterior surface.  These were gradually taken down.  Some more quite vascular and we used clips  and cautery to get good hemostasis.  The tunnel of fibrous material through which the cholecystostomy tube had passed was then divided with cautery.  Once the dome of the gallbladder was revealed it was retracted superior medially.  I then further did lysis of adhesions from the body and down to the infundibulum of the gallbladder.  Total 20 minutes.  Once the infundibulum was cleared away it was retracted inferior laterally.  Dissection began laterally and progressed medially.  This was a bit tedious but we were able to identify the cystic duct.  I achieved a critical view of safety.  The cystic duct was kind of short precluding cholangiogram.  3 clips were placed proximally and one was placed distally.  It was divided.  I then identified the cystic artery which was clipped twice proximally once distally and divided.  The gallbladder was taken off the liver bed.  This was done with cautery achieving hemostasis.  We encountered a posterior branch of the cystic artery.  This was clipped twice proximally and divided distally with cautery.  The gallbladder was taken the rest the way off the liver bed using cautery.  It was placed in a bag and removed from the abdomen.  It was sent to pathology.  The liver bed was then cauterized further to get excellent hemostasis.  Clips remain in good position.  The area was fully irrigated out.  I reinspected the liver bed and it was dry I did put a piece of Surgicel snow there.  Next, I revisited the area of omental adhesions that had been bleeding before.  There was good hemostasis there but I did put a piece of Surgicel snow in that location as  well.  The remainder the irrigation fluid was evacuated.  There was good hemostasis at this time.  Ports were removed under direct vision.  Pneumoperitoneum was released.  Infraumbilical fascia was closed by tying the pursestring.  All 4 wounds were irrigated and the skin of each was closed with 4-0 Vicryl followed by Dermabond.  All  counts were correct.  She tolerated the procedure well.  The CRNA noted close to the end of the case that they felt some subcutaneous emphysema on her upper chest.  This was likely due to some dissection of the CO2 during laparoscopy.  This should resolve.  There were no other complications.  She was taken recovery in stable condition. PATIENT DISPOSITION:  PACU - hemodynamically stable.   Delay start of Pharmacological VTE agent (>24hrs) due to surgical blood loss or risk of bleeding:  yes  Georganna Skeans, MD, MPH, FACS Pager: [phone]  5/4/202112:56 PM

## 2020-04-04 NOTE — Interval H&P Note (Signed)
History and Physical Interval Note:  04/04/2020 10:38 AM  Cheryl Atkinson  has presented today for surgery, with the diagnosis of CHOLECYSTITIS.  The various methods of treatment have been discussed with the patient and family. After consideration of risks, benefits and other options for treatment, the patient has consented to  Procedure(s): LAPAROSCOPIC CHOLECYSTECTOMY (N/A) as a surgical intervention.  The patient's history has been reviewed, patient examined, no change in status, stable for surgery.  I have reviewed the patient's chart and labs.  Questions were answered to the patient's satisfaction.     Liz Malady

## 2020-04-04 NOTE — Anesthesia Procedure Notes (Addendum)
Procedure Name: Intubation Date/Time: 04/04/2020 11:19 AM Performed by: Lovie Chol, CRNA Pre-anesthesia Checklist: Patient identified, Emergency Drugs available, Suction available and Patient being monitored Patient Re-evaluated:Patient Re-evaluated prior to induction Oxygen Delivery Method: Circle System Utilized Preoxygenation: Pre-oxygenation with 100% oxygen Induction Type: IV induction Ventilation: Mask ventilation without difficulty Laryngoscope Size: Miller and 2 Grade View: Grade I Tube type: Oral Tube size: 7.0 mm Number of attempts: 1 Airway Equipment and Method: Stylet and Oral airway Placement Confirmation: ETT inserted through vocal cords under direct vision,  positive ETCO2 and breath sounds checked- equal and bilateral Secured at: 22 cm Tube secured with: Tape Dental Injury: Teeth and Oropharynx as per pre-operative assessment

## 2020-04-05 LAB — CBC
HCT: 32 % — ABNORMAL LOW (ref 36.0–46.0)
Hemoglobin: 10 g/dL — ABNORMAL LOW (ref 12.0–15.0)
MCH: 27.7 pg (ref 26.0–34.0)
MCHC: 31.3 g/dL (ref 30.0–36.0)
MCV: 88.6 fL (ref 80.0–100.0)
Platelets: 241 10*3/uL (ref 150–400)
RBC: 3.61 MIL/uL — ABNORMAL LOW (ref 3.87–5.11)
RDW: 14 % (ref 11.5–15.5)
WBC: 11.2 10*3/uL — ABNORMAL HIGH (ref 4.0–10.5)
nRBC: 0 % (ref 0.0–0.2)

## 2020-04-05 LAB — SURGICAL PATHOLOGY

## 2020-04-05 MED ORDER — CLOPIDOGREL BISULFATE 75 MG PO TABS: 75.0000 mg | ORAL_TABLET | Freq: Every day | ORAL | 0 refills | Status: DC

## 2020-04-05 MED ORDER — APIXABAN 5 MG PO TABS: 5.0000 mg | ORAL_TABLET | Freq: Two times a day (BID) | ORAL | 11 refills | Status: DC

## 2020-04-05 NOTE — Discharge Summary (Signed)
Physician Discharge Summary  Patient ID: Cheryl Atkinson MRN: [id number] DOB/AGE: 1931-10-20 86 y.o.  Admit date: 04/04/2020 Discharge date: 04/05/2020  Admission Diagnoses:cholecystitis, S/P percutaneous cholecystostomy  Discharge Diagnoses:  Active Problems:   S/P laparoscopic cholecystectomy   Discharged Condition: good  Hospital Course: Admitted S/P laparoscopic cholecystectomy. She did well overnight and is ready for discharge. In light of significant bloody dissection, will resume Eliquis and Plavix tomorrow. I discussed this with her. She wants to take her home Ultram PRN for pain.  Consults: None  Significant Diagnostic Studies: labs: Marland Kitchen  Treatments: surgery  Discharge Exam: Blood pressure 131/61, pulse 69, temperature 98 F (36.7 C), temperature source Oral, resp. rate 17, height 5\' 6"  (1.676 m), weight 63.5 kg, SpO2 100 %. General appearance: alert, cooperative and eating breakfast Resp: clear to auscultation bilaterally Cardio: irregularly irregular rhythm GI: soft, incisions CDI Neurologic: Mental status: Alert, oriented, thought content appropriate  Disposition: Discharge disposition: 01-Home or Self Care       Discharge Instructions    Call MD for:  persistant nausea and vomiting   Complete by: As directed    Call MD for:  redness, tenderness, or signs of infection (pain, swelling, redness, odor or green/yellow discharge around incision site)   Complete by: As directed    Diet - low sodium heart healthy   Complete by: As directed    Increase activity slowly   Complete by: As directed    No dressing needed   Complete by: As directed      Allergies as of 04/05/2020      Reactions   Codeine Other (See Comments)   REACTION: Syncope       Medication List    TAKE these medications   Albuterol Sulfate 108 (90 Base) MCG/ACT Aepb Commonly known as: ProAir RespiClick Inhale 2 puffs into the lungs every 6 (six) hours as needed (shortness of breath from  COPD).   apixaban 5 MG Tabs tablet Commonly known as: Eliquis Take 1 tablet (5 mg total) by mouth 2 (two) times daily. Start 04/06/20 What changed: additional instructions   atorvastatin 40 MG tablet Commonly known as: LIPITOR Take 1 tablet (40 mg total) by mouth daily.   clopidogrel 75 MG tablet Commonly known as: PLAVIX Take 1 tablet (75 mg total) by mouth daily. Start 04/06/20 What changed: additional instructions   furosemide 40 MG tablet Commonly known as: LASIX Take 40 mg by mouth every other day. What changed: Another medication with the same name was removed. Continue taking this medication, and follow the directions you see here.   irbesartan 300 MG tablet Commonly known as: AVAPRO Take 1 tablet (300 mg total) by mouth daily.   Neupro 3 MG/24HR Pt24 Generic drug: Rotigotine PLACE 1 PATCH (3 MG) ONTO THE SKIN AT BEDTIME What changed: See the new instructions.   polyethylene glycol 17 g packet Commonly known as: MIRALAX / GLYCOLAX Take 17 g by mouth 2 (two) times daily. Reported on 03/14/2016 What changed:   when to take this  reasons to take this  additional instructions   potassium chloride SA 20 MEQ tablet Commonly known as: KLOR-CON Take 1 tablet (20 mEq total) by mouth daily as needed (take on days that you take lasix). What changed: when to take this   PRESERVISION AREDS 2 PO Take 1 tablet by mouth 2 (two) times daily.   sodium chloride flush 0.9 % Soln Commonly known as: NS 5 mLs by Intracatheter route every 8 (eight) hours.  traMADol 50 MG tablet Commonly known as: ULTRAM Take 50 mg by mouth daily as needed for moderate pain.   traZODone 50 MG tablet Commonly known as: DESYREL Take 1.5 tablets (75 mg total) by mouth at bedtime as needed for sleep. What changed:   how much to take  when to take this      Follow-up Information    Georganna Skeans, MD Follow up.   Specialty: General Surgery Why: My office will call you to make a follow-up  appointment in 2 weeks Contact information: [address] [place] [address] [phone]           Signed: Zenovia Jarred 04/05/2020, 7:17 AM

## 2020-04-05 NOTE — Progress Notes (Signed)
AVS given and reviewed with pt and pt's son, Tammy Sours. Medications discussed. All questions answered to satisfaction. Pt and son verbalized understanding of information given. Pt to be escorted off the unit with all belongings via wheelchair by volunteer services.

## 2020-04-05 NOTE — Plan of Care (Signed)
  Problem: Education: Goal: Knowledge of General Education information will improve Description: Including pain rating scale, medication(s)/side effects and non-pharmacologic comfort measures Outcome: Progressing   Problem: Health Behavior/Discharge Planning: Goal: Ability to manage health-related needs will improve Outcome: Progressing   Problem: Clinical Measurements: Goal: Ability to maintain clinical measurements within normal limits will improve Outcome: Progressing Goal: Respiratory complications will improve Outcome: Progressing Goal: Cardiovascular complication will be avoided Outcome: Progressing   Problem: Activity: Goal: Risk for activity intolerance will decrease Outcome: Progressing   Problem: Nutrition: Goal: Adequate nutrition will be maintained Outcome: Progressing   Problem: Elimination: Goal: Will not experience complications related to urinary retention Outcome: Progressing   Problem: Pain Managment: Goal: General experience of comfort will improve Outcome: Progressing   Problem: Safety: Goal: Ability to remain free from injury will improve Outcome: Progressing   Problem: Skin Integrity: Goal: Risk for impaired skin integrity will decrease Outcome: Progressing   

## 2020-04-05 NOTE — Discharge Instructions (Signed)
[facility] ______[facility] °LAPAROSCOPIC SURGERY: POST OP INSTRUCTIONS °Always review your discharge instruction sheet given to you by the facility where your surgery was performed. °IF YOU HAVE DISABILITY OR FAMILY LEAVE FORMS, YOU MUST BRING THEM TO THE OFFICE FOR PROCESSING.   °DO NOT GIVE THEM TO YOUR DOCTOR. ° °1. A prescription for pain medication may be given to you upon discharge.  Take your pain medication as prescribed, if needed.  If narcotic pain medicine is not needed, then you may take acetaminophen (Tylenol) or ibuprofen (Advil) as needed. °2. Take your usually prescribed medications unless otherwise directed. °3. If you need a refill on your pain medication, please contact your pharmacy.  They will contact our office to request authorization. Prescriptions will not be filled after 5pm or on week-ends. °4. You should follow a light diet the first few days after arrival home, such as soup and crackers, etc.  Be sure to include lots of fluids daily. °5. Most patients will experience some swelling and bruising in the area of the incisions.  Ice packs will help.  Swelling and bruising can take several days to resolve.  °6. It is common to experience some constipation if taking pain medication after surgery.  Increasing fluid intake and taking a stool softener (such as Colace) will usually help or prevent this problem from occurring.  A mild laxative (Milk of Magnesia or Miralax) should be taken according to package instructions if there are no bowel movements after 48 hours. °7. Unless discharge instructions indicate otherwise, you may remove your bandages 24-48 hours after surgery, and you may shower at that time.  You may have steri-strips (small skin tapes) in place directly over the incision.  These strips should be left on the skin for 7-10 days.  If your surgeon used skin glue on the incision, you may shower in 24 hours.  The glue will flake off over the next 2-3 weeks.  Any sutures or  staples will be removed at the office during your follow-up visit. °8. ACTIVITIES:  You may resume regular (light) daily activities beginning the next day--such as daily self-care, walking, climbing stairs--gradually increasing activities as tolerated.  You may have sexual intercourse when it is comfortable.  Refrain from any heavy lifting or straining until approved by your doctor. °a. You may drive when you are no longer taking prescription pain medication, you can comfortably wear a seatbelt, and you can safely maneuver your car and apply brakes. °b. RETURN TO WORK:  __________________________________________________________ °9. You should see your doctor in the office for a follow-up appointment approximately 2-3 weeks after your surgery.  Make sure that you call for this appointment within a day or two after you arrive home to insure a convenient appointment time. °10. OTHER INSTRUCTIONS: __________________________________________________________________________________________________________________________ __________________________________________________________________________________________________________________________ °WHEN TO CALL YOUR DOCTOR: °1. Fever over 101.0 °2. Inability to urinate °3. Continued bleeding from incision. °4. Increased pain, redness, or drainage from the incision. °5. Increasing abdominal pain ° °The clinic staff is available to answer your questions during regular business hours.  Please don’t hesitate to call and ask to speak to one of the nurses for clinical concerns.  If you have a medical emergency, go to the nearest emergency room or call 911.  A surgeon from [facility] is always on call at the hospital. °[address], [address], [place], [place]  [address] ? [address] [address], [place], [place]   [address] °[phone] ? [phone] ? FAX [phone] °Web site:   [email or web address] °

## 2020-04-06 ENCOUNTER — Emergency Department (HOSPITAL_COMMUNITY): Payer: Medicare Other

## 2020-04-06 ENCOUNTER — Other Ambulatory Visit: Payer: Self-pay

## 2020-04-06 ENCOUNTER — Inpatient Hospital Stay (HOSPITAL_COMMUNITY)
Admission: EM | Admit: 2020-04-06 | Discharge: 2020-04-17 | DRG: 417 | Disposition: A | Discharge status: Skilled Nursing Facility | Payer: Medicare Other | Attending: Internal Medicine | Admitting: Internal Medicine

## 2020-04-06 DIAGNOSIS — K651 Peritoneal abscess: Secondary | ICD-10-CM

## 2020-04-06 DIAGNOSIS — K750 Abscess of liver: Secondary | ICD-10-CM | POA: Diagnosis not present

## 2020-04-06 DIAGNOSIS — J449 Chronic obstructive pulmonary disease, unspecified: Secondary | ICD-10-CM | POA: Diagnosis present

## 2020-04-06 DIAGNOSIS — E872 Acidosis: Secondary | ICD-10-CM | POA: Diagnosis not present

## 2020-04-06 DIAGNOSIS — R0902 Hypoxemia: Secondary | ICD-10-CM | POA: Diagnosis not present

## 2020-04-06 DIAGNOSIS — I959 Hypotension, unspecified: Secondary | ICD-10-CM | POA: Diagnosis not present

## 2020-04-06 DIAGNOSIS — D649 Anemia, unspecified: Secondary | ICD-10-CM | POA: Diagnosis not present

## 2020-04-06 DIAGNOSIS — R0602 Shortness of breath: Secondary | ICD-10-CM

## 2020-04-06 DIAGNOSIS — Z87891 Personal history of nicotine dependence: Secondary | ICD-10-CM

## 2020-04-06 DIAGNOSIS — R413 Other amnesia: Secondary | ICD-10-CM | POA: Diagnosis present

## 2020-04-06 DIAGNOSIS — Z20822 Contact with and (suspected) exposure to covid-19: Secondary | ICD-10-CM | POA: Diagnosis not present

## 2020-04-06 DIAGNOSIS — I701 Atherosclerosis of renal artery: Secondary | ICD-10-CM | POA: Diagnosis present

## 2020-04-06 DIAGNOSIS — K5900 Constipation, unspecified: Secondary | ICD-10-CM | POA: Diagnosis present

## 2020-04-06 DIAGNOSIS — E8779 Other fluid overload: Secondary | ICD-10-CM | POA: Diagnosis not present

## 2020-04-06 DIAGNOSIS — K91870 Postprocedural hematoma of a digestive system organ or structure following a digestive system procedure: Secondary | ICD-10-CM | POA: Diagnosis not present

## 2020-04-06 DIAGNOSIS — N179 Acute kidney failure, unspecified: Secondary | ICD-10-CM | POA: Diagnosis not present

## 2020-04-06 DIAGNOSIS — Z7902 Long term (current) use of antithrombotics/antiplatelets: Secondary | ICD-10-CM

## 2020-04-06 DIAGNOSIS — Z7901 Long term (current) use of anticoagulants: Secondary | ICD-10-CM

## 2020-04-06 DIAGNOSIS — R531 Weakness: Secondary | ICD-10-CM | POA: Diagnosis not present

## 2020-04-06 DIAGNOSIS — R739 Hyperglycemia, unspecified: Secondary | ICD-10-CM | POA: Diagnosis present

## 2020-04-06 DIAGNOSIS — Y92234 Operating room of hospital as the place of occurrence of the external cause: Secondary | ICD-10-CM | POA: Diagnosis not present

## 2020-04-06 DIAGNOSIS — Z961 Presence of intraocular lens: Secondary | ICD-10-CM | POA: Diagnosis present

## 2020-04-06 DIAGNOSIS — M19011 Primary osteoarthritis, right shoulder: Secondary | ICD-10-CM | POA: Diagnosis present

## 2020-04-06 DIAGNOSIS — E860 Dehydration: Secondary | ICD-10-CM | POA: Diagnosis not present

## 2020-04-06 DIAGNOSIS — J9601 Acute respiratory failure with hypoxia: Secondary | ICD-10-CM | POA: Diagnosis not present

## 2020-04-06 DIAGNOSIS — J9622 Acute and chronic respiratory failure with hypercapnia: Secondary | ICD-10-CM | POA: Diagnosis not present

## 2020-04-06 DIAGNOSIS — Z87440 Personal history of urinary (tract) infections: Secondary | ICD-10-CM

## 2020-04-06 DIAGNOSIS — Z95820 Peripheral vascular angioplasty status with implants and grafts: Secondary | ICD-10-CM

## 2020-04-06 DIAGNOSIS — T8143XA Infection following a procedure, organ and space surgical site, initial encounter: Secondary | ICD-10-CM

## 2020-04-06 DIAGNOSIS — I739 Peripheral vascular disease, unspecified: Secondary | ICD-10-CM | POA: Diagnosis present

## 2020-04-06 DIAGNOSIS — T40605A Adverse effect of unspecified narcotics, initial encounter: Secondary | ICD-10-CM

## 2020-04-06 DIAGNOSIS — K660 Peritoneal adhesions (postprocedural) (postinfection): Secondary | ICD-10-CM | POA: Diagnosis present

## 2020-04-06 DIAGNOSIS — Z9842 Cataract extraction status, left eye: Secondary | ICD-10-CM

## 2020-04-06 DIAGNOSIS — Z9089 Acquired absence of other organs: Secondary | ICD-10-CM

## 2020-04-06 DIAGNOSIS — Z79891 Long term (current) use of opiate analgesic: Secondary | ICD-10-CM

## 2020-04-06 DIAGNOSIS — E875 Hyperkalemia: Secondary | ICD-10-CM | POA: Diagnosis not present

## 2020-04-06 DIAGNOSIS — Z9689 Presence of other specified functional implants: Secondary | ICD-10-CM | POA: Diagnosis present

## 2020-04-06 DIAGNOSIS — K219 Gastro-esophageal reflux disease without esophagitis: Secondary | ICD-10-CM | POA: Diagnosis present

## 2020-04-06 DIAGNOSIS — Z885 Allergy status to narcotic agent status: Secondary | ICD-10-CM

## 2020-04-06 DIAGNOSIS — Z8249 Family history of ischemic heart disease and other diseases of the circulatory system: Secondary | ICD-10-CM

## 2020-04-06 DIAGNOSIS — I10 Essential (primary) hypertension: Secondary | ICD-10-CM | POA: Diagnosis not present

## 2020-04-06 DIAGNOSIS — J9621 Acute and chronic respiratory failure with hypoxia: Secondary | ICD-10-CM

## 2020-04-06 DIAGNOSIS — G4700 Insomnia, unspecified: Secondary | ICD-10-CM | POA: Diagnosis present

## 2020-04-06 DIAGNOSIS — Z66 Do not resuscitate: Secondary | ICD-10-CM | POA: Diagnosis present

## 2020-04-06 DIAGNOSIS — R54 Age-related physical debility: Secondary | ICD-10-CM | POA: Diagnosis present

## 2020-04-06 DIAGNOSIS — Y836 Removal of other organ (partial) (total) as the cause of abnormal reaction of the patient, or of later complication, without mention of misadventure at the time of the procedure: Secondary | ICD-10-CM | POA: Diagnosis not present

## 2020-04-06 DIAGNOSIS — G92 Toxic encephalopathy: Secondary | ICD-10-CM | POA: Diagnosis not present

## 2020-04-06 DIAGNOSIS — I454 Nonspecific intraventricular block: Secondary | ICD-10-CM | POA: Diagnosis present

## 2020-04-06 DIAGNOSIS — T40425A Adverse effect of tramadol, initial encounter: Secondary | ICD-10-CM | POA: Diagnosis not present

## 2020-04-06 DIAGNOSIS — G2581 Restless legs syndrome: Secondary | ICD-10-CM | POA: Diagnosis present

## 2020-04-06 DIAGNOSIS — G9340 Encephalopathy, unspecified: Secondary | ICD-10-CM

## 2020-04-06 DIAGNOSIS — I5032 Chronic diastolic (congestive) heart failure: Secondary | ICD-10-CM | POA: Diagnosis present

## 2020-04-06 DIAGNOSIS — Z9841 Cataract extraction status, right eye: Secondary | ICD-10-CM

## 2020-04-06 DIAGNOSIS — R52 Pain, unspecified: Secondary | ICD-10-CM | POA: Diagnosis not present

## 2020-04-06 DIAGNOSIS — E876 Hypokalemia: Secondary | ICD-10-CM | POA: Diagnosis not present

## 2020-04-06 DIAGNOSIS — I4819 Other persistent atrial fibrillation: Secondary | ICD-10-CM | POA: Diagnosis present

## 2020-04-06 DIAGNOSIS — Z79899 Other long term (current) drug therapy: Secondary | ICD-10-CM

## 2020-04-06 DIAGNOSIS — I77811 Abdominal aortic ectasia: Secondary | ICD-10-CM | POA: Diagnosis present

## 2020-04-06 DIAGNOSIS — H8110 Benign paroxysmal vertigo, unspecified ear: Secondary | ICD-10-CM | POA: Diagnosis present

## 2020-04-06 DIAGNOSIS — I110 Hypertensive heart disease with heart failure: Secondary | ICD-10-CM | POA: Diagnosis present

## 2020-04-06 DIAGNOSIS — R0689 Other abnormalities of breathing: Secondary | ICD-10-CM

## 2020-04-06 DIAGNOSIS — M19012 Primary osteoarthritis, left shoulder: Secondary | ICD-10-CM | POA: Diagnosis present

## 2020-04-06 DIAGNOSIS — Z823 Family history of stroke: Secondary | ICD-10-CM

## 2020-04-06 DIAGNOSIS — Z90710 Acquired absence of both cervix and uterus: Secondary | ICD-10-CM

## 2020-04-06 DIAGNOSIS — T8182XA Emphysema (subcutaneous) resulting from a procedure, initial encounter: Secondary | ICD-10-CM | POA: Diagnosis not present

## 2020-04-06 DIAGNOSIS — M48061 Spinal stenosis, lumbar region without neurogenic claudication: Secondary | ICD-10-CM | POA: Diagnosis present

## 2020-04-06 DIAGNOSIS — Z9049 Acquired absence of other specified parts of digestive tract: Secondary | ICD-10-CM

## 2020-04-06 DIAGNOSIS — K8013 Calculus of gallbladder with acute and chronic cholecystitis with obstruction: Principal | ICD-10-CM | POA: Diagnosis present

## 2020-04-06 DIAGNOSIS — E785 Hyperlipidemia, unspecified: Secondary | ICD-10-CM | POA: Diagnosis present

## 2020-04-06 DIAGNOSIS — J9600 Acute respiratory failure, unspecified whether with hypoxia or hypercapnia: Secondary | ICD-10-CM | POA: Diagnosis present

## 2020-04-06 DIAGNOSIS — J9602 Acute respiratory failure with hypercapnia: Secondary | ICD-10-CM | POA: Diagnosis not present

## 2020-04-06 DIAGNOSIS — R188 Other ascites: Secondary | ICD-10-CM | POA: Diagnosis not present

## 2020-04-06 DIAGNOSIS — R410 Disorientation, unspecified: Secondary | ICD-10-CM | POA: Diagnosis not present

## 2020-04-06 DIAGNOSIS — M810 Age-related osteoporosis without current pathological fracture: Secondary | ICD-10-CM | POA: Diagnosis present

## 2020-04-06 DIAGNOSIS — K810 Acute cholecystitis: Secondary | ICD-10-CM | POA: Diagnosis present

## 2020-04-06 LAB — COMPREHENSIVE METABOLIC PANEL
ALT: 20 U/L (ref 0–44)
AST: 27 U/L (ref 15–41)
Albumin: 3.4 g/dL — ABNORMAL LOW (ref 3.5–5.0)
Alkaline Phosphatase: 93 U/L (ref 38–126)
Anion gap: 12 (ref 5–15)
BUN: 44 mg/dL — ABNORMAL HIGH (ref 8–23)
CO2: 23 mmol/L (ref 22–32)
Calcium: 8.8 mg/dL — ABNORMAL LOW (ref 8.9–10.3)
Chloride: 102 mmol/L (ref 98–111)
Creatinine, Ser: 2.32 mg/dL — ABNORMAL HIGH (ref 0.44–1.00)
GFR calc Af Amer: 21 mL/min — ABNORMAL LOW (ref >60)
GFR calc non Af Amer: 18 mL/min — ABNORMAL LOW (ref >60)
Glucose, Bld: 119 mg/dL — ABNORMAL HIGH (ref 70–99)
Potassium: 5.5 mmol/L — ABNORMAL HIGH (ref 3.5–5.1)
Sodium: 137 mmol/L (ref 135–145)
Total Bilirubin: 0.9 mg/dL (ref 0.3–1.2)
Total Protein: 7.1 g/dL (ref 6.5–8.1)

## 2020-04-06 LAB — CBC WITH DIFFERENTIAL/PLATELET
Abs Immature Granulocytes: 0.4 10*3/uL — ABNORMAL HIGH (ref 0.00–0.07)
Basophils Absolute: 0.1 10*3/uL (ref 0.0–0.1)
Basophils Relative: 0 %
Eosinophils Absolute: 0 10*3/uL (ref 0.0–0.5)
Eosinophils Relative: 0 %
HCT: 35.5 % — ABNORMAL LOW (ref 36.0–46.0)
Hemoglobin: 10.7 g/dL — ABNORMAL LOW (ref 12.0–15.0)
Immature Granulocytes: 2 %
Lymphocytes Relative: 4 %
Lymphs Abs: 0.8 10*3/uL (ref 0.7–4.0)
MCH: 27.8 pg (ref 26.0–34.0)
MCHC: 30.1 g/dL (ref 30.0–36.0)
MCV: 92.2 fL (ref 80.0–100.0)
Monocytes Absolute: 1.6 10*3/uL — ABNORMAL HIGH (ref 0.1–1.0)
Monocytes Relative: 8 %
Neutro Abs: 17.5 10*3/uL — ABNORMAL HIGH (ref 1.7–7.7)
Neutrophils Relative %: 86 %
Platelets: 223 10*3/uL (ref 150–400)
RBC: 3.85 MIL/uL — ABNORMAL LOW (ref 3.87–5.11)
RDW: 14.6 % (ref 11.5–15.5)
WBC: 20.4 10*3/uL — ABNORMAL HIGH (ref 4.0–10.5)
nRBC: 0 % (ref 0.0–0.2)

## 2020-04-06 LAB — BLOOD GAS, VENOUS
Acid-base deficit: 5.3 mmol/L — ABNORMAL HIGH (ref 0.0–2.0)
Acid-base deficit: 3.3 mmol/L — ABNORMAL HIGH (ref 0.0–2.0)
Bicarbonate: 23.7 mmol/L (ref 20.0–28.0)
Bicarbonate: 24.4 mmol/L (ref 20.0–28.0)
O2 Saturation: 45 %
O2 Saturation: 43.8 %
Patient temperature: 98.6
Patient temperature: 98.6
pCO2, Ven: 68.1 mmHg — ABNORMAL HIGH (ref 44.0–60.0)
pCO2, Ven: 61.5 mmHg — ABNORMAL HIGH (ref 44.0–60.0)
pH, Ven: 7.167 — CL (ref 7.250–7.430)
pH, Ven: 7.223 — ABNORMAL LOW (ref 7.250–7.430)
pO2, Ven: 33.4 mmHg (ref 32.0–45.0)
pO2, Ven: <31 mmHg — CL (ref 32.0–45.0)

## 2020-04-06 LAB — LIPASE, BLOOD: Lipase: 17 U/L (ref 11–51)

## 2020-04-06 LAB — RESPIRATORY PANEL BY RT PCR (FLU A&B, COVID)
Influenza A by PCR: NEGATIVE
Influenza B by PCR: NEGATIVE
SARS Coronavirus 2 by RT PCR: NEGATIVE

## 2020-04-06 LAB — LACTIC ACID, PLASMA: Lactic Acid, Venous: 1.3 mmol/L (ref 0.5–1.9)

## 2020-04-06 MED ORDER — NALOXONE HCL 0.4 MG/ML IJ SOLN
0.4000 mg | Freq: Once | INTRAMUSCULAR | Status: AC
  Administered 2020-04-06: 0.4 mg via INTRAVENOUS
  Filled 2020-04-06: qty 1

## 2020-04-06 MED ORDER — LACTATED RINGERS IV BOLUS
1000.0000 mL | Freq: Once | INTRAVENOUS | Status: AC
  Administered 2020-04-06: 1000 mL via INTRAVENOUS

## 2020-04-06 MED ORDER — TRAZODONE HCL 50 MG PO TABS: 75.0000 mg | ORAL_TABLET | Freq: Every evening | ORAL | 3 refills | Status: DC | PRN

## 2020-04-06 MED ORDER — FUROSEMIDE 40 MG PO TABS: 40.0000 mg | ORAL_TABLET | ORAL | 3 refills | Status: DC

## 2020-04-06 NOTE — Progress Notes (Signed)
Per Dr Anitra Lauth, hold off on bipap placement until after narcan given.  RN to advise when assistance needed.

## 2020-04-06 NOTE — ED Triage Notes (Signed)
Pt BIB EMS from home, pt lives with son. Pt had gallbladder surgery 5/3 and c/o weakness since surgery. Family c/o confusion and lethargy.   87% RA, EMS reports 96% 2L [place] 105 CBG 108/52

## 2020-04-06 NOTE — ED Notes (Signed)
Respiratory at bedside to apply bipap

## 2020-04-06 NOTE — ED Provider Notes (Signed)
[facility]-EMERGENCY DEPT Provider Note   CSN: [id number] Arrival date & time: 04/06/20  1741     History Chief Complaint  Patient presents with  . Post-op Problem  . Weakness    Cheryl [facility] is a 87 y.o. female.  Patient is an 86 year old female with a history of COPD, CHF, PAD, hypertension and recent laparoscopic cholecystectomy on 04/04/2020 who was discharged yesterday in good condition presenting today from home where she lives with her son.  Complaint was weakness since surgery and family also noted some confusion and lethargy today.  EMS noted patient was satting 87% on room air and was placed on 2 L nasal cannula.  Patient will wake up and talk and states her stomach was hurting but does not hurt anymore.  She denies any shortness of breath.  She denies any vomiting.  She does state that she is urinated very little and reports she feels like she needs to urinate now.  She denies any chest pain or shortness of breath.  She was discharged home with tramadol for pain.  The history is provided by the patient.  Weakness      Past Medical History:  Diagnosis Date  . Anemia   . Arthritis    "shoulders" (05/04/2015)  . Cellulitis of right lower extremity 11/27/2013  . CHF (congestive heart failure) (HCC)   . Chronic lower back pain   . Constipation   . COPD (chronic obstructive pulmonary disease) (HCC)   . GERD (gastroesophageal reflux disease)   . History of hiatal hernia   . HLD (hyperlipidemia)   . Hypertension   . Insomnia   . Osteoporosis   . Peripheral arterial disease (HCC)   . Restless leg syndrome   . Thyroid disease     Patient Active Problem List   Diagnosis Date Noted  . S/P laparoscopic cholecystectomy 04/04/2020  . CHF (congestive heart failure) (HCC) 03/01/2020  . Insomnia 03/01/2020  . Bacteremia 01/28/2020  . Acute cholangitis 01/25/2020  . Atrial fibrillation (HCC) 01/25/2020  . Acute cholecystitis 12/29/2019  . Adrenal  nodule (HCC) 12/29/2019  . Abdominal aortic ectasia (HCC) 12/29/2019  . Major depression 03/25/2018  . Hypokalemia 12/21/2016  . Cat bite of right hand 12/21/2016  . BPPV (benign paroxysmal positional vertigo) 07/12/2016  . Memory loss 04/11/2016  . Mallet toe of right foot 10/20/2015  . Renal artery stenosis (HCC) 09/27/2015  . Hyperlipidemia 06/30/2015  . Claudication (HCC) 05/04/2015  . Atherosclerotic PVD with intermittent claudication (HCC) 04/26/2015  . Tinnitus 12/31/2014  . Former smoker 09/29/2014  . DNR (do not resuscitate) 09/29/2014  . Insulin resistance 07/19/2014  . Multinodular goiter 08/30/2013  . Spinal stenosis of lumbar region at multiple levels 09/29/2012  . HIP PAIN, BILATERAL 07/16/2010  . COPD (chronic obstructive pulmonary disease) (HCC) 09/20/2009  . CONSTIPATION, CHRONIC 09/20/2009  . Iron deficiency anemia 11/09/2007  . History of UTI 11/09/2007  . RESTLESS LEG SYNDROME 09/25/2007  . Essential hypertension 09/25/2007  . GERD 09/25/2007  . LOW BACK PAIN 09/25/2007  . Osteoporosis 09/25/2007    Past Surgical History:  Procedure Laterality Date  . ABDOMINAL AORTAGRAM  05/04/2015   Procedure: Abdominal Aortagram;  Surgeon: Runell Gess, MD;  Location: [facility] INVASIVE CV LAB;  Service: Cardiovascular;;  . APPENDECTOMY    . CATARACT EXTRACTION W/ INTRAOCULAR LENS  IMPLANT, BILATERAL Bilateral   . CHOLECYSTECTOMY N/A 04/04/2020   Procedure: LAPAROSCOPIC CHOLECYSTECTOMY;  Surgeon: Violeta Gelinas, MD;  Location: [facility] OR;  Service: General;  Laterality: N/A;  . I & D EXTREMITY Right 12/22/2016   Procedure: IRRIGATION AND DEBRIDEMENT EXTREMITY;  Surgeon: Mack Hookavid Thompson, MD;  [facility];  Service: Orthopedics;  Laterality: Right;  . IR EXCHANGE BILIARY DRAIN  03/13/2020  . IR PATIENT EVAL TECH 0-60 MINS  12/30/2019  . IR PERC CHOLECYSTOSTOMY  01/27/2020  . PERIPHERAL VASCULAR CATHETERIZATION N/A 05/04/2015   Procedure: Lower Extremity Angiography;  Surgeon:  Runell GessJonathan J Berry, MD;  [facility];  Service: Cardiovascular;  Laterality: N/A;  . PERIPHERAL VASCULAR CATHETERIZATION  05/04/2015   Procedure: Peripheral Vascular Intervention;  Surgeon: Runell GessJonathan J Berry, MD;  [facility];  Service: Cardiovascular;;  RCIA - 7x22 ICAST  . PERIPHERAL VASCULAR CATHETERIZATION Right 09/04/2015   Procedure: Peripheral Vascular Atherectomy;  Surgeon: Runell GessJonathan J Berry, MD;  [facility];  Service: Cardiovascular;  Laterality: Right;  SFA  . sfa Right 09/04/2015   de balloon  . THYROID SURGERY Right ?2013   "had goiter taken off my neck"  . TONSILLECTOMY    . VAGINAL HYSTERECTOMY       OB History   No obstetric history on file.     Family History  Problem Relation Age of Onset  . Heart disease Father   . Heart attack Father   . Heart disease Mother   . Coronary artery disease Other   . Atrial fibrillation Sister   . Stroke Maternal Grandmother   . Cancer Paternal Grandmother     Social History   Tobacco Use  . Smoking status: Former Smoker    Packs/day: 0.50    Years: 60.00    Pack years: 30.00    Types: Cigarettes    Quit date: 04/02/2015    Years since quitting: 5.0  . Smokeless tobacco: Never Used  Substance Use Topics  . Alcohol use: No  . Drug use: No    Home Medications Prior to Admission medications   Medication Sig Start Date End Date Taking? Authorizing Provider  Albuterol Sulfate (PROAIR RESPICLICK) 108 (90 Base) MCG/ACT AEPB Inhale 2 puffs into the lungs every 6 (six) hours as needed (shortness of breath from COPD). 12/29/18   Shelva MajesticHunter, Stephen O, MD  apixaban (ELIQUIS) 5 MG TABS tablet Take 1 tablet (5 mg total) by mouth 2 (two) times daily. Start 04/06/20 04/05/20   Violeta Gelinashompson, Burke, MD  atorvastatin (LIPITOR) 40 MG tablet Take 1 tablet (40 mg total) by mouth daily. 03/16/20   Shelva MajesticHunter, Stephen O, MD  clopidogrel (PLAVIX) 75 MG tablet Take 1 tablet (75 mg total) by mouth daily. Start 04/06/20  04/05/20   Violeta Gelinashompson, Burke, MD  furosemide (LASIX) 40 MG tablet Take 1 tablet (40 mg total) by mouth every other day. 04/06/20   Shelva MajesticHunter, Stephen O, MD  irbesartan (AVAPRO) 300 MG tablet Take 1 tablet (300 mg total) by mouth daily. 03/25/20   Shelva MajesticHunter, Stephen O, MD  Multiple Vitamins-Minerals (PRESERVISION AREDS 2 PO) Take 1 tablet by mouth 2 (two) times daily.    [provider]  NEUPRO 3 MG/24HR PT24 PLACE 1 PATCH (3 MG) ONTO THE SKIN AT BEDTIME Patient taking differently: Apply 1 patch topically at bedtime. Take off in the morning 03/29/20   Shelva MajesticHunter, Stephen O, MD  polyethylene glycol (MIRALAX / GLYCOLAX) 17 g packet Take 17 g by mouth 2 (two) times daily. Reported on 03/14/2016 Patient taking differently: Take 17 g by mouth daily as needed for mild constipation.  01/03/20   Hongalgi, Maximino GreenlandAnand D, MD  potassium chloride SA (KLOR-CON) 20 MEQ tablet Take 1 tablet (20 mEq total) by mouth daily as needed (take on days that you take lasix). Patient taking differently: Take 20 mEq by mouth every other day.  03/25/20   Shelva Majestic, MD  sodium chloride flush (NS) 0.9 % SOLN 5 mLs by Intracatheter route every 8 (eight) hours. 01/03/20   Juliet Rude, PA-C  traMADol (ULTRAM) 50 MG tablet Take 50 mg by mouth daily as needed for moderate pain.  02/12/20   [provider]  traZODone (DESYREL) 50 MG tablet Take 1.5 tablets (75 mg total) by mouth at bedtime as needed for sleep. 04/06/20   Shelva Majestic, MD    Allergies    Codeine  Review of Systems   Review of Systems  Neurological: Positive for weakness.  All other systems reviewed and are negative.   Physical Exam Updated Vital Signs BP (!) 112/57 (BP Location: Right Arm)   Temp 100 F (37.8 C) (Rectal)   Resp (!) 8   LMP  (LMP Unknown)   SpO2 91%   Physical Exam Vitals and nursing note reviewed.  Constitutional:      General: She is not in acute distress.    Appearance: She is well-developed and normal weight.     Comments:  Lethargic but does awaken to voice  HENT:     Head: Normocephalic and atraumatic.     Mouth/Throat:     Mouth: Mucous membranes are dry.  Eyes:     Conjunctiva/sclera: Conjunctivae normal.     Pupils: Pupils are equal, round, and reactive to light.  Cardiovascular:     Rate and Rhythm: Normal rate and regular rhythm.     Pulses: Normal pulses.     Heart sounds: Normal heart sounds. No murmur. No friction rub.  Pulmonary:     Effort: Pulmonary effort is normal.     Breath sounds: Normal breath sounds. No wheezing or rales.     Comments: Patient appears very sleepy and poor respiratory effort Abdominal:     General: Bowel sounds are normal. There is distension.     Palpations: Abdomen is soft.     Tenderness: There is no abdominal tenderness. There is no guarding or rebound.     Comments: Mildly distended with 3 laparoscopic wounds that are closed and intact.  No drainage noted.  Mild ecchymosis.  Minimal tenderness with palpation but no frank localized discomfort.  Also suprapubic distention.  Musculoskeletal:        General: No tenderness. Normal range of motion.     Right lower leg: No edema.     Left lower leg: No edema.     Comments: No edema  Skin:    General: Skin is warm and dry.     Findings: No rash.  Neurological:     Cranial Nerves: No cranial nerve deficit.     Comments: Oriented but very sluggish.  Able to move all extremities     ED Results / Procedures / Treatments   Labs (all labs ordered are listed, but only abnormal results are displayed) Labs Reviewed  CBC WITH DIFFERENTIAL/PLATELET - Abnormal; Notable for the following components:      Result Value   WBC 20.4 (*)    RBC 3.85 (*)    Hemoglobin 10.7 (*)    HCT 35.5 (*)    Neutro Abs 17.5 (*)    Monocytes Absolute 1.6 (*)    Abs Immature Granulocytes 0.40 (*)  All other components within normal limits  COMPREHENSIVE METABOLIC PANEL - Abnormal; Notable for the following components:   Potassium 5.5  (*)    Glucose, Bld 119 (*)    BUN 44 (*)    Creatinine, Ser 2.32 (*)    Calcium 8.8 (*)    Albumin 3.4 (*)    GFR calc non Af Amer 18 (*)    GFR calc Af Amer 21 (*)    All other components within normal limits  BLOOD GAS, VENOUS - Abnormal; Notable for the following components:   pH, Ven 7.167 (*)    pCO2, Ven 68.1 (*)    Acid-base deficit 5.3 (*)    All other components within normal limits  RESPIRATORY PANEL BY RT PCR (FLU A&B, COVID)  LIPASE, BLOOD  LACTIC ACID, PLASMA  URINALYSIS, ROUTINE W REFLEX MICROSCOPIC    EKG EKG Interpretation  Date/Time:  Thursday Apr 06 2020 18:50:20 EDT Ventricular Rate:  79 PR Interval:    QRS Duration: 115 QT Interval:  398 QTC Calculation: 457 R Axis:   -15 Text Interpretation: recurrent Atrial fibrillation Nonspecific intraventricular conduction delay Confirmed by Gwyneth Sprout ([id number]) on 04/06/2020 7:01:26 PM   Radiology DG Chest Port 1 View  Result Date: 04/06/2020 CLINICAL DATA:  Weakness. EXAM: PORTABLE CHEST 1 VIEW COMPARISON:  02/15/2020 FINDINGS: Enlarged cardiac silhouette with an interval increase in size. Tortuous aorta. Minimal bibasilar linear atelectasis or scarring. Stable proximal right humeral calcified enchondroma or old infarct. IMPRESSION: No acute abnormality. Electronically Signed   By: Beckie Salts M.D.   On: 04/06/2020 18:42    Procedures Procedures (including critical care time)  Medications Ordered in ED Medications  lactated ringers bolus 1,000 mL (1,000 mLs Intravenous New Bag/Given 04/06/20 1831)    ED Course  I have reviewed the triage vital signs and the nursing notes.  Pertinent labs & imaging results that were available during my care of the patient were reviewed by me and considered in my medical decision making (see chart for details).    MDM Rules/Calculators/A&P                      Elderly female presenting from home after discharge yesterday after cholecystectomy 2 days ago.  Patient is  lethargic and has decreased respiratory rate.  Concern for possible overmedication.  Patient does also hypoxic at 87% on room air.  She does have a history of COPD but does not wear home oxygen.  Also temperature of 100 today.  Patient has mild abdominal discomfort but no frank pain.  Concern for possible CO2 narcosis versus narcotic side effects.  Also patient has distention of her abdomen and complaining that she has not been able to urinate.  Also reports she has not been eating.  Concern for possible electrolyte abnormality versus urinary retention.  Bladder scan pending.  Patient currently on 2 L of oxygen.  Labs and chest x-ray is pending.  Attempted to contact her son Tammy Sours and left a message for further details.  Due to a lot of bleeding with dissection to remove the gallbladder her Eliquis and Plavix were held until today.  Low suspicion at this time for PE, stroke and no significant findings concerning for heart failure.  9:19 PM Labs are significant for leukocytosis of 20,000 from 11,000 yesterday.  Hemoglobin remained stable, Covid is negative, lactic acid is within normal limits.  Patient CMP shows a new AKI with creatinine of 2.31 from baseline of 0.7 mild hyperkalemia 5.5  and an elevated BUN of 44.  Concern for prerenal azotemia and dehydration.  Lipase is within normal limits and no evidence of anion gap at this time.  VBG does show a uncompensated respiratory acidosis with pH of 7.16 and CO2 of 68.  Patient was given Narcan with mild improvement in her respiratory rate up to 14.  She had no vomiting or other negative response.  Patient was started on BiPAP to hopefully improve her CO2.  She tolerated this well.  Concerned that potential pain medication has decreased her respiratory rate and caused hypercarbia.  Chest x-ray was otherwise within normal limits.  Discussed patient with Dr. Toy Care given she was just discharged from the surgery service yesterday.  However at this time 2 days out from  surgery would be unlikely for an acute abscess and patient does not have any belly pain to suggest perforation or new biloma as she is not vomiting or having significant pain at this time.  Surgery will follow the patient but at this time patient needs medical management.  Will repeat VBG after she has been on BiPAP for 1 hour.  9:36 PM Patient is now more awake and discussed with her what was going on.  Recommended that she stay in the hospital she is now stating she does not want to stay in the hospital.  Spoke with the patient's son and discussed with him my concerns.  He states that she definitely needs to stay and he will talk with her.  At this time do not feel that patient is safe to go home and with recent confusion do not know that she is able to make this decision knowledgeably.  10:17 PM Repeat VBG shows improved pH from 7.16-7.22.  Mild improvement of CO2 from 68-61.  However patient mentally is more lucid at this time.  CRITICAL CARE Performed by: Phares Zaccone Total critical care time: 40 minutes Critical care time was exclusive of separately billable procedures and treating other patients. Critical care was necessary to treat or prevent imminent or life-threatening deterioration. Critical care was time spent personally by me on the following activities: development of treatment plan with patient and/or surrogate as well as nursing, discussions with consultants, evaluation of patient's response to treatment, examination of patient, obtaining history from patient or surrogate, ordering and performing treatments and interventions, ordering and review of laboratory studies, ordering and review of radiographic studies, pulse oximetry and re-evaluation of patient's condition.   Final Clinical Impression(s) / ED Diagnoses Final diagnoses:  Acute on chronic respiratory failure with hypoxia and hypercapnia (HCC)  Narcotic-induced respiratory depression  AKI (acute kidney injury) ([place])     Rx / DC Orders ED Discharge Orders    None       Blanchie Dessert, MD 04/07/20 0000

## 2020-04-06 NOTE — ED Notes (Signed)
Spoke to son with update.

## 2020-04-06 NOTE — Progress Notes (Addendum)
Placed pt on bipap per Dr Anitra Lauth.  RN aware.  Pt tolerating bipap well at this time.

## 2020-04-07 ENCOUNTER — Observation Stay (HOSPITAL_COMMUNITY): Payer: Medicare Other

## 2020-04-07 ENCOUNTER — Encounter (HOSPITAL_COMMUNITY): Payer: Self-pay

## 2020-04-07 ENCOUNTER — Other Ambulatory Visit: Payer: Self-pay

## 2020-04-07 ENCOUNTER — Other Ambulatory Visit (HOSPITAL_COMMUNITY): Payer: Medicare Other

## 2020-04-07 DIAGNOSIS — G92 Toxic encephalopathy: Secondary | ICD-10-CM | POA: Diagnosis not present

## 2020-04-07 DIAGNOSIS — J9602 Acute respiratory failure with hypercapnia: Secondary | ICD-10-CM | POA: Diagnosis not present

## 2020-04-07 DIAGNOSIS — I10 Essential (primary) hypertension: Secondary | ICD-10-CM | POA: Diagnosis not present

## 2020-04-07 DIAGNOSIS — K660 Peritoneal adhesions (postprocedural) (postinfection): Secondary | ICD-10-CM | POA: Diagnosis present

## 2020-04-07 DIAGNOSIS — R41841 Cognitive communication deficit: Secondary | ICD-10-CM | POA: Diagnosis not present

## 2020-04-07 DIAGNOSIS — J9600 Acute respiratory failure, unspecified whether with hypoxia or hypercapnia: Secondary | ICD-10-CM | POA: Diagnosis not present

## 2020-04-07 DIAGNOSIS — R531 Weakness: Secondary | ICD-10-CM | POA: Diagnosis not present

## 2020-04-07 DIAGNOSIS — E875 Hyperkalemia: Secondary | ICD-10-CM | POA: Diagnosis not present

## 2020-04-07 DIAGNOSIS — J449 Chronic obstructive pulmonary disease, unspecified: Secondary | ICD-10-CM | POA: Diagnosis not present

## 2020-04-07 DIAGNOSIS — Z7401 Bed confinement status: Secondary | ICD-10-CM | POA: Diagnosis not present

## 2020-04-07 DIAGNOSIS — R278 Other lack of coordination: Secondary | ICD-10-CM | POA: Diagnosis not present

## 2020-04-07 DIAGNOSIS — M48061 Spinal stenosis, lumbar region without neurogenic claudication: Secondary | ICD-10-CM | POA: Diagnosis not present

## 2020-04-07 DIAGNOSIS — Y92234 Operating room of hospital as the place of occurrence of the external cause: Secondary | ICD-10-CM | POA: Diagnosis not present

## 2020-04-07 DIAGNOSIS — I110 Hypertensive heart disease with heart failure: Secondary | ICD-10-CM | POA: Diagnosis present

## 2020-04-07 DIAGNOSIS — T8182XA Emphysema (subcutaneous) resulting from a procedure, initial encounter: Secondary | ICD-10-CM | POA: Diagnosis not present

## 2020-04-07 DIAGNOSIS — K91870 Postprocedural hematoma of a digestive system organ or structure following a digestive system procedure: Secondary | ICD-10-CM | POA: Diagnosis not present

## 2020-04-07 DIAGNOSIS — R2689 Other abnormalities of gait and mobility: Secondary | ICD-10-CM | POA: Diagnosis not present

## 2020-04-07 DIAGNOSIS — G9340 Encephalopathy, unspecified: Secondary | ICD-10-CM

## 2020-04-07 DIAGNOSIS — I701 Atherosclerosis of renal artery: Secondary | ICD-10-CM | POA: Diagnosis present

## 2020-04-07 DIAGNOSIS — G2581 Restless legs syndrome: Secondary | ICD-10-CM | POA: Diagnosis not present

## 2020-04-07 DIAGNOSIS — M2550 Pain in unspecified joint: Secondary | ICD-10-CM | POA: Diagnosis not present

## 2020-04-07 DIAGNOSIS — I5032 Chronic diastolic (congestive) heart failure: Secondary | ICD-10-CM | POA: Diagnosis present

## 2020-04-07 DIAGNOSIS — R188 Other ascites: Secondary | ICD-10-CM | POA: Diagnosis not present

## 2020-04-07 DIAGNOSIS — I77811 Abdominal aortic ectasia: Secondary | ICD-10-CM | POA: Diagnosis present

## 2020-04-07 DIAGNOSIS — R0602 Shortness of breath: Secondary | ICD-10-CM | POA: Diagnosis not present

## 2020-04-07 DIAGNOSIS — I959 Hypotension, unspecified: Secondary | ICD-10-CM | POA: Diagnosis not present

## 2020-04-07 DIAGNOSIS — I4891 Unspecified atrial fibrillation: Secondary | ICD-10-CM | POA: Diagnosis not present

## 2020-04-07 DIAGNOSIS — J80 Acute respiratory distress syndrome: Secondary | ICD-10-CM | POA: Diagnosis not present

## 2020-04-07 DIAGNOSIS — J9601 Acute respiratory failure with hypoxia: Secondary | ICD-10-CM | POA: Diagnosis present

## 2020-04-07 DIAGNOSIS — K750 Abscess of liver: Secondary | ICD-10-CM | POA: Diagnosis not present

## 2020-04-07 DIAGNOSIS — F329 Major depressive disorder, single episode, unspecified: Secondary | ICD-10-CM | POA: Diagnosis not present

## 2020-04-07 DIAGNOSIS — R54 Age-related physical debility: Secondary | ICD-10-CM | POA: Diagnosis present

## 2020-04-07 DIAGNOSIS — R091 Pleurisy: Secondary | ICD-10-CM | POA: Diagnosis not present

## 2020-04-07 DIAGNOSIS — K219 Gastro-esophageal reflux disease without esophagitis: Secondary | ICD-10-CM | POA: Diagnosis not present

## 2020-04-07 DIAGNOSIS — E872 Acidosis: Secondary | ICD-10-CM | POA: Diagnosis not present

## 2020-04-07 DIAGNOSIS — N179 Acute kidney failure, unspecified: Secondary | ICD-10-CM

## 2020-04-07 DIAGNOSIS — Z20822 Contact with and (suspected) exposure to covid-19: Secondary | ICD-10-CM | POA: Diagnosis present

## 2020-04-07 DIAGNOSIS — R2681 Unsteadiness on feet: Secondary | ICD-10-CM | POA: Diagnosis not present

## 2020-04-07 DIAGNOSIS — I454 Nonspecific intraventricular block: Secondary | ICD-10-CM | POA: Diagnosis present

## 2020-04-07 DIAGNOSIS — R0902 Hypoxemia: Secondary | ICD-10-CM | POA: Diagnosis not present

## 2020-04-07 DIAGNOSIS — Y836 Removal of other organ (partial) (total) as the cause of abnormal reaction of the patient, or of later complication, without mention of misadventure at the time of the procedure: Secondary | ICD-10-CM | POA: Diagnosis not present

## 2020-04-07 DIAGNOSIS — E8779 Other fluid overload: Secondary | ICD-10-CM | POA: Diagnosis not present

## 2020-04-07 DIAGNOSIS — M6281 Muscle weakness (generalized): Secondary | ICD-10-CM | POA: Diagnosis not present

## 2020-04-07 DIAGNOSIS — I4819 Other persistent atrial fibrillation: Secondary | ICD-10-CM | POA: Diagnosis present

## 2020-04-07 DIAGNOSIS — Z66 Do not resuscitate: Secondary | ICD-10-CM | POA: Diagnosis present

## 2020-04-07 DIAGNOSIS — K8013 Calculus of gallbladder with acute and chronic cholecystitis with obstruction: Secondary | ICD-10-CM | POA: Diagnosis present

## 2020-04-07 DIAGNOSIS — I509 Heart failure, unspecified: Secondary | ICD-10-CM | POA: Diagnosis not present

## 2020-04-07 DIAGNOSIS — T40425A Adverse effect of tramadol, initial encounter: Secondary | ICD-10-CM | POA: Diagnosis not present

## 2020-04-07 LAB — BLOOD GAS, ARTERIAL
Acid-base deficit: 4 mmol/L — ABNORMAL HIGH (ref 0.0–2.0)
Acid-base deficit: 4 mmol/L — ABNORMAL HIGH (ref 0.0–2.0)
Bicarbonate: 22.5 mmol/L (ref 20.0–28.0)
Bicarbonate: 22.7 mmol/L (ref 20.0–28.0)
Delivery systems: POSITIVE
Drawn by: 225631
Expiratory PAP: 6
FIO2: 30
Inspiratory PAP: 14
O2 Saturation: 98.4 %
O2 Saturation: 97.2 %
Patient temperature: 98.2
Patient temperature: 98.5
pCO2 arterial: 50.9 mmHg — ABNORMAL HIGH (ref 32.0–48.0)
pCO2 arterial: 52.8 mmHg — ABNORMAL HIGH (ref 32.0–48.0)
pH, Arterial: 7.267 — ABNORMAL LOW (ref 7.350–7.450)
pH, Arterial: 7.255 — ABNORMAL LOW (ref 7.350–7.450)
pO2, Arterial: 150 mmHg — ABNORMAL HIGH (ref 83.0–108.0)
pO2, Arterial: 110 mmHg — ABNORMAL HIGH (ref 83.0–108.0)

## 2020-04-07 LAB — BASIC METABOLIC PANEL
Anion gap: 9 (ref 5–15)
BUN: 50 mg/dL — ABNORMAL HIGH (ref 8–23)
CO2: 24 mmol/L (ref 22–32)
Calcium: 8.3 mg/dL — ABNORMAL LOW (ref 8.9–10.3)
Chloride: 103 mmol/L (ref 98–111)
Creatinine, Ser: 2.38 mg/dL — ABNORMAL HIGH (ref 0.44–1.00)
GFR calc Af Amer: 21 mL/min — ABNORMAL LOW (ref >60)
GFR calc non Af Amer: 18 mL/min — ABNORMAL LOW (ref >60)
Glucose, Bld: 97 mg/dL (ref 70–99)
Potassium: 5 mmol/L (ref 3.5–5.1)
Sodium: 136 mmol/L (ref 135–145)

## 2020-04-07 LAB — APTT
aPTT: 41 seconds — ABNORMAL HIGH (ref 24–36)
aPTT: 61 seconds — ABNORMAL HIGH (ref 24–36)

## 2020-04-07 LAB — CBC
HCT: 30.3 % — ABNORMAL LOW (ref 36.0–46.0)
Hemoglobin: 9 g/dL — ABNORMAL LOW (ref 12.0–15.0)
MCH: 27.8 pg (ref 26.0–34.0)
MCHC: 29.7 g/dL — ABNORMAL LOW (ref 30.0–36.0)
MCV: 93.5 fL (ref 80.0–100.0)
Platelets: 194 10*3/uL (ref 150–400)
RBC: 3.24 MIL/uL — ABNORMAL LOW (ref 3.87–5.11)
RDW: 14.5 % (ref 11.5–15.5)
WBC: 15.5 10*3/uL — ABNORMAL HIGH (ref 4.0–10.5)
nRBC: 0 % (ref 0.0–0.2)

## 2020-04-07 LAB — URINALYSIS, ROUTINE W REFLEX MICROSCOPIC
Bilirubin Urine: NEGATIVE
Glucose, UA: NEGATIVE mg/dL
Hgb urine dipstick: NEGATIVE
Ketones, ur: NEGATIVE mg/dL
Leukocytes,Ua: NEGATIVE
Nitrite: NEGATIVE
Protein, ur: NEGATIVE mg/dL
Specific Gravity, Urine: 1.014 (ref 1.005–1.030)
pH: 5 (ref 5.0–8.0)

## 2020-04-07 LAB — PROTIME-INR
INR: 1.3 — ABNORMAL HIGH (ref 0.8–1.2)
Prothrombin Time: 16.1 seconds — ABNORMAL HIGH (ref 11.4–15.2)

## 2020-04-07 LAB — SODIUM, URINE, RANDOM: Sodium, Ur: 24 mmol/L

## 2020-04-07 LAB — MRSA PCR SCREENING: MRSA by PCR: NEGATIVE

## 2020-04-07 LAB — CREATININE, URINE, RANDOM: Creatinine, Urine: 130 mg/dL

## 2020-04-07 LAB — HEPARIN LEVEL (UNFRACTIONATED): Heparin Unfractionated: 1.76 IU/mL — ABNORMAL HIGH (ref 0.30–0.70)

## 2020-04-07 MED ORDER — SODIUM CHLORIDE 0.9 % IV SOLN: INTRAVENOUS | Status: DC

## 2020-04-07 MED ORDER — ACETAMINOPHEN 325 MG PO TABS
650.0000 mg | ORAL_TABLET | Freq: Four times a day (QID) | ORAL | Status: DC | PRN
  Administered 2020-04-08 – 2020-04-17 (×7): 650 mg via ORAL
  Filled 2020-04-07 (×9): qty 2

## 2020-04-07 MED ORDER — CHLORHEXIDINE GLUCONATE CLOTH 2 % EX PADS
6.0000 | MEDICATED_PAD | Freq: Every day | CUTANEOUS | Status: DC
  Administered 2020-04-07 – 2020-04-08 (×3): 6 via TOPICAL

## 2020-04-07 MED ORDER — CLOPIDOGREL BISULFATE 75 MG PO TABS
75.0000 mg | ORAL_TABLET | Freq: Every day | ORAL | Status: DC
  Administered 2020-04-08 – 2020-04-11 (×4): 75 mg via ORAL
  Filled 2020-04-07 (×5): qty 1

## 2020-04-07 MED ORDER — ACETAMINOPHEN 650 MG RE SUPP
650.0000 mg | Freq: Four times a day (QID) | RECTAL | Status: DC | PRN
  Administered 2020-04-11: 650 mg via RECTAL
  Filled 2020-04-07: qty 1

## 2020-04-07 MED ORDER — HEPARIN (PORCINE) 25000 UT/250ML-% IV SOLN
850.0000 [IU]/h | INTRAVENOUS | Status: DC
  Administered 2020-04-07: 800 [IU]/h via INTRAVENOUS
  Filled 2020-04-07: qty 250

## 2020-04-07 MED ORDER — SODIUM CHLORIDE 0.9 % IV SOLN: INTRAVENOUS | Status: AC

## 2020-04-07 MED ORDER — ATORVASTATIN CALCIUM 40 MG PO TABS
40.0000 mg | ORAL_TABLET | Freq: Every day | ORAL | Status: DC
  Administered 2020-04-08 – 2020-04-17 (×10): 40 mg via ORAL
  Filled 2020-04-07 (×11): qty 1

## 2020-04-07 MED ORDER — APIXABAN 5 MG PO TABS
5.0000 mg | ORAL_TABLET | Freq: Two times a day (BID) | ORAL | Status: DC
  Filled 2020-04-07: qty 1

## 2020-04-07 MED ORDER — NALOXONE HCL 0.4 MG/ML IJ SOLN
0.4000 mg | INTRAMUSCULAR | Status: DC | PRN
  Administered 2020-04-07: 0.4 mg via INTRAVENOUS

## 2020-04-07 MED ORDER — NALOXONE HCL 0.4 MG/ML IJ SOLN
INTRAMUSCULAR | Status: AC
  Filled 2020-04-07: qty 1

## 2020-04-07 MED ORDER — IPRATROPIUM-ALBUTEROL 0.5-2.5 (3) MG/3ML IN SOLN
3.0000 mL | Freq: Four times a day (QID) | RESPIRATORY_TRACT | Status: DC | PRN
  Administered 2020-04-09: 3 mL via RESPIRATORY_TRACT
  Filled 2020-04-07: qty 3

## 2020-04-07 NOTE — Progress Notes (Signed)
Pt transported from ED to CT and back, on bipap @100 % fio2.  Pt tolerated transport well without incident.

## 2020-04-07 NOTE — ED Notes (Addendum)
   Bladder scanned showed 220 ML, and 240 ml on 2 scans.  Admitting provider stated too order in/ou if greater than 350 ML

## 2020-04-07 NOTE — ED Notes (Signed)
Pt responses to touch and voice. PT pupils are unequal and sluggish. PT able to move arms and has hand grip.  MD notified head CT place.

## 2020-04-07 NOTE — ED Notes (Signed)
No urine output noted this shift, bladder scan showed 350+ urine in bladder with distention noted. MD paged and verbal order for foley placed.

## 2020-04-07 NOTE — H&P (Addendum)
History and Physical    Cheryl Atkinson TMA:[id number] DOB: [birth year] DOA: 04/06/2020  PCP: Shelva Majestic, MD Patient coming from: Home  Chief Complaint: Lethargy, confusion  HPI: Cheryl Atkinson is a 86 y.o. female with medical history significant of persistent A. fib, chronic diastolic CHF, COPD, GERD, hypertension, hyperlipidemia, osteoporosis, PVD, RLS, thyroid disease, arthritis, chronic lower back pain, recently admitted on 5/4 for cholecystitis and underwent laparoscopic cholecystectomy.  She was discharged from the hospital yesterday and prescribed tramadol for pain.  Being sent to the ED today via EMS by family due to concern for lethargy and confusion.  EMS reported oxygen saturation of 87% on room air, placed on 2 L supplemental oxygen.  History very limited as patient is currently on BiPAP.  She is somnolent but waking up and moving all extremities on command.  Denies chest pain, abdominal pain, or pain anywhere else.  No additional history could be obtained from her at this time.  I called and spoke to the patient's son who is not sure whether she is taking any pain medications at home.  ED Course: Patient noted to have respiratory depression upon arrival to the ED.  VBG with pH 7.16 and PCO2 68.  Temperature 100 F.  Labs showing significant leukocytosis (WBC count 20.4).  Lactic acid normal.  Hemoglobin 10.7, stable since labs done 2 days ago.  Potassium 5.5.  BUN 44, creatinine 2.3.  Creatinine was 0.7 on 5/4.  Lipase normal.  LFTs normal.  UA pending.  SARS-CoV-2 PCR test negative.  Influenza panel negative. Chest x-ray showing no acute abnormality. Appeared to be very dehydrated as bladder scan revealed only 120 cc urine.  ED provider discussed the case with Dr. Lovell Sheehan from general surgery who felt that an acute abscess is unlikely at this time 2 days out from surgery.  No abdominal pain to suggest perforation or new biloma.  Patient's respiratory rate and mental status  improved after she was given Narcan and placed on BiPAP.  Repeat VBG slightly improved with pH 7.22 and PCO2 61.  She was given 1 L LR bolus.  Review of Systems:  All systems reviewed and apart from history of presenting illness, are negative.  Past Medical History:  Diagnosis Date  . Anemia   . Arthritis    "shoulders" (05/04/2015)  . Cellulitis of right lower extremity 11/27/2013  . CHF (congestive heart failure) (HCC)   . Chronic lower back pain   . Constipation   . COPD (chronic obstructive pulmonary disease) (HCC)   . GERD (gastroesophageal reflux disease)   . History of hiatal hernia   . HLD (hyperlipidemia)   . Hypertension   . Insomnia   . Osteoporosis   . Peripheral arterial disease (HCC)   . Restless leg syndrome   . Thyroid disease     Past Surgical History:  Procedure Laterality Date  . ABDOMINAL AORTAGRAM  05/04/2015   Procedure: Abdominal Aortagram;  Surgeon: Runell Gess, MD;  Location: [facility] INVASIVE CV LAB;  Service: Cardiovascular;;  . APPENDECTOMY    . CATARACT EXTRACTION W/ INTRAOCULAR LENS  IMPLANT, BILATERAL Bilateral   . CHOLECYSTECTOMY N/A 04/04/2020   Procedure: LAPAROSCOPIC CHOLECYSTECTOMY;  Surgeon: Violeta Gelinas, MD;  Location: [facility] OR;  Service: General;  Laterality: N/A;  . I & D EXTREMITY Right 12/22/2016   Procedure: IRRIGATION AND DEBRIDEMENT EXTREMITY;  Surgeon: Mack Hook, MD;  Location: [facility] OR;  Service: Orthopedics;  Laterality: Right;  . IR EXCHANGE BILIARY DRAIN  03/13/2020  .  IR PATIENT EVAL TECH 0-60 MINS  12/30/2019  . IR PERC CHOLECYSTOSTOMY  01/27/2020  . PERIPHERAL VASCULAR CATHETERIZATION N/A 05/04/2015   Procedure: Lower Extremity Angiography;  Surgeon: Runell GessJonathan J Berry, MD;  [facility];  Service: Cardiovascular;  Laterality: N/A;  . PERIPHERAL VASCULAR CATHETERIZATION  05/04/2015   Procedure: Peripheral Vascular Intervention;  Surgeon: Runell GessJonathan J Berry, MD;  [facility];  Service: Cardiovascular;;   RCIA - 7x22 ICAST  . PERIPHERAL VASCULAR CATHETERIZATION Right 09/04/2015   Procedure: Peripheral Vascular Atherectomy;  Surgeon: Runell GessJonathan J Berry, MD;  [facility];  Service: Cardiovascular;  Laterality: Right;  SFA  . sfa Right 09/04/2015   de balloon  . THYROID SURGERY Right ?2013   "had goiter taken off my neck"  . TONSILLECTOMY    . VAGINAL HYSTERECTOMY       reports that she quit smoking about 5 years ago. Her smoking use included cigarettes. She has a 30.00 pack-year smoking history. She has never used smokeless tobacco. She reports that she does not drink alcohol or use drugs.  Allergies  Allergen Reactions  . Codeine Other (See Comments)    REACTION: Syncope     Family History  Problem Relation Age of Onset  . Heart disease Father   . Heart attack Father   . Heart disease Mother   . Coronary artery disease Other   . Atrial fibrillation Sister   . Stroke Maternal Grandmother   . Cancer Paternal Grandmother     Prior to Admission medications   Medication Sig Start Date End Date Taking? Authorizing Provider  Albuterol Sulfate (PROAIR RESPICLICK) 108 (90 Base) MCG/ACT AEPB Inhale 2 puffs into the lungs every 6 (six) hours as needed (shortness of breath from COPD). 12/29/18   Shelva MajesticHunter, Stephen O, MD  apixaban (ELIQUIS) 5 MG TABS tablet Take 1 tablet (5 mg total) by mouth 2 (two) times daily. Start 04/06/20 04/05/20   Violeta Gelinashompson, Burke, MD  atorvastatin (LIPITOR) 40 MG tablet Take 1 tablet (40 mg total) by mouth daily. 03/16/20   Shelva MajesticHunter, Stephen O, MD  clopidogrel (PLAVIX) 75 MG tablet Take 1 tablet (75 mg total) by mouth daily. Start 04/06/20 04/05/20   Violeta Gelinashompson, Burke, MD  furosemide (LASIX) 40 MG tablet Take 1 tablet (40 mg total) by mouth every other day. 04/06/20   Shelva MajesticHunter, Stephen O, MD  irbesartan (AVAPRO) 300 MG tablet Take 1 tablet (300 mg total) by mouth daily. 03/25/20   Shelva MajesticHunter, Stephen O, MD  Multiple Vitamins-Minerals (PRESERVISION AREDS 2 PO) Take 1 tablet by  mouth 2 (two) times daily.    [provider]  NEUPRO 3 MG/24HR PT24 PLACE 1 PATCH (3 MG) ONTO THE SKIN AT BEDTIME Patient taking differently: Apply 1 patch topically at bedtime. Take off in the morning 03/29/20   Shelva MajesticHunter, Stephen O, MD  polyethylene glycol (MIRALAX / GLYCOLAX) 17 g packet Take 17 g by mouth 2 (two) times daily. Reported on 03/14/2016 Patient taking differently: Take 17 g by mouth daily as needed for mild constipation.  01/03/20   Hongalgi, Maximino GreenlandAnand D, MD  potassium chloride SA (KLOR-CON) 20 MEQ tablet Take 1 tablet (20 mEq total) by mouth daily as needed (take on days that you take lasix). Patient taking differently: Take 20 mEq by mouth every other day.  03/25/20   Shelva MajesticHunter, Stephen O, MD  sodium chloride flush (NS) 0.9 % SOLN 5 mLs by Intracatheter route every 8 (eight) hours. Patient not taking: Reported on 04/06/2020  01/03/20   Juliet Rude, PA-C  traMADol (ULTRAM) 50 MG tablet Take 50 mg by mouth daily as needed for moderate pain.  02/12/20   [provider]  traZODone (DESYREL) 50 MG tablet Take 1.5 tablets (75 mg total) by mouth at bedtime as needed for sleep. 04/06/20   Shelva Majestic, MD    Physical Exam: Vitals:   04/06/20 2315 04/06/20 2330 04/06/20 2345 04/07/20 0027  BP: (!) 118/55 (!) 117/58 (!) 115/55 (!) 113/56  Pulse: 69 83 78 85  Resp: 12 13 12 16   Temp:      TempSrc:      SpO2: 100% 100% 99% 97%    Physical Exam  Constitutional: No distress.  HENT:  Head: Normocephalic.  Eyes: Pupils are equal, round, and reactive to light. Right eye exhibits no discharge. Left eye exhibits no discharge.  Cardiovascular: Normal rate, regular rhythm and intact distal pulses.  Pulmonary/Chest: Breath sounds normal. She has no wheezes. She has no rales.  Satting 100% on BiPAP  Abdominal: Soft. Bowel sounds are normal. She exhibits distension. There is no abdominal tenderness. There is no rebound and no guarding.  Abdomen appears slightly distended but  nontender to palpation  Musculoskeletal:        General: No edema.     Cervical back: Neck supple.  Neurological:  Somnolent but waking up and moving all extremities on command  Skin: Skin is warm and dry. She is not diaphoretic.    Labs on Admission: I have personally reviewed following labs and imaging studies  CBC: Recent Labs  Lab 04/04/20 0847 04/05/20 0249 04/06/20 1831  WBC 5.7 11.2* 20.4*  NEUTROABS  --   --  17.5*  HGB 11.5* 10.0* 10.7*  HCT 37.2 32.0* 35.5*  MCV 89.0 88.6 92.2  PLT 237 241 223   Basic Metabolic Panel: Recent Labs  Lab 04/04/20 0847 04/06/20 1831  NA 139 137  K 4.2 5.5*  CL 108 102  CO2 22 23  GLUCOSE 100* 119*  BUN 27* 44*  CREATININE 0.76 2.32*  CALCIUM 9.2 8.8*   GFR: Estimated Creatinine Clearance: 16.3 mL/min (A) (by C-G formula based on SCr of 2.32 mg/dL (H)). Liver Function Tests: Recent Labs  Lab 04/04/20 0847 04/06/20 1831  AST 16 27  ALT 14 20  ALKPHOS 87 93  BILITOT 0.5 0.9  PROT 7.4 7.1  ALBUMIN 3.8 3.4*   Recent Labs  Lab 04/06/20 1831  LIPASE 17   No results for input(s): AMMONIA in the last 168 hours. Coagulation Profile: Recent Labs  Lab 04/04/20 0847  INR 1.1   Cardiac Enzymes: No results for input(s): CKTOTAL, CKMB, CKMBINDEX, TROPONINI in the last 168 hours. BNP (last 3 results) Recent Labs    02/11/20 0936  PROBNP 228.0*   HbA1C: No results for input(s): HGBA1C in the last 72 hours. CBG: No results for input(s): GLUCAP in the last 168 hours. Lipid Profile: No results for input(s): CHOL, HDL, LDLCALC, TRIG, CHOLHDL, LDLDIRECT in the last 72 hours. Thyroid Function Tests: No results for input(s): TSH, T4TOTAL, FREET4, T3FREE, THYROIDAB in the last 72 hours. Anemia Panel: No results for input(s): VITAMINB12, FOLATE, FERRITIN, TIBC, IRON, RETICCTPCT in the last 72 hours. Urine analysis:    Component Value Date/Time   COLORURINE YELLOW 04/06/2020 2150   APPEARANCEUR HAZY (A) 04/06/2020 2150     LABSPEC 1.014 04/06/2020 2150   PHURINE 5.0 04/06/2020 2150   GLUCOSEU NEGATIVE 04/06/2020 2150   HGBUR NEGATIVE 04/06/2020 2150  BILIRUBINUR NEGATIVE 04/06/2020 2150   BILIRUBINUR Negative 02/15/2020 1052   KETONESUR NEGATIVE 04/06/2020 2150   PROTEINUR NEGATIVE 04/06/2020 2150   UROBILINOGEN 0.2 02/15/2020 1052   UROBILINOGEN 0.2 08/20/2019 1732   NITRITE NEGATIVE 04/06/2020 2150   LEUKOCYTESUR NEGATIVE 04/06/2020 2150    Radiological Exams on Admission: DG Chest Port 1 View  Result Date: 04/06/2020 CLINICAL DATA:  Weakness. EXAM: PORTABLE CHEST 1 VIEW COMPARISON:  02/15/2020 FINDINGS: Enlarged cardiac silhouette with an interval increase in size. Tortuous aorta. Minimal bibasilar linear atelectasis or scarring. Stable proximal right humeral calcified enchondroma or old infarct. IMPRESSION: No acute abnormality. Electronically Signed   By: Claudie Revering M.D.   On: 04/06/2020 18:42    EKG: Independently reviewed.  A. fib, heart rate 79.  Assessment/Plan Principal Problem:   Acute respiratory failure (HCC) Active Problems:   COPD (chronic obstructive pulmonary disease) (HCC)   S/P laparoscopic cholecystectomy   Encephalopathy   AKI (acute kidney injury) ([place])   Acute hypoxic hypercarbic respiratory failure/ respiratory depression/ acute encephalopathy: Suspect related to tramadol overuse.  She was discharged from the hospital yesterday after cholecystectomy and prescribed tramadol for pain.  Chest x-ray showing no acute abnormality.  EMS reported oxygen saturation 87% on room air.  Patient's respiratory rate and mental status improved after she was given Narcan and placed on BiPAP.  Initial VBG showing severe acidosis (pH 7.16) and evidence of hypercarbia (PCO2 68).  After being on BiPAP, repeat VBG appears slightly improved with pH 7.22 and PCO2 61. -Hold any opiates or other sedating medications.  Continue BiPAP and order ABG.  Continuous pulse ox.  Addendum: Nursing staff  concerned that the patient's pupils are  unequal; and sluggish. I have reassessed the patient and pupils appear equal in size and reactive to light, response slightly sluggish on the left. Patient continues to be somnolent but does open eyes and moving all extremities on command. STAT head CT was ordered and currently pending. Repeat ABG ordered.   AKI: Suspect prerenal due to severe dehydration as she only had 120 cc urine on bladder scan. BUN 44, creatinine 2.3.  Creatinine was 0.7 on 5/4.  -Continue IV fluid hydration.  Monitor renal function and urine output.  Avoid nephrotoxic agents/contrast.  Check urine sodium, creatinine.  Order renal ultrasound.  Recent acute cholecystitis status post laparoscopic cholecystectomy on 04/04/2020: Does have significant leukocytosis on labs with WBC count 20.4.  Temperature 100 F.  Lactic acid normal.  No tachycardia or hypotension.  Lipase and LFTs normal. ED provider discussed the case with Dr. Arnoldo Morale from general surgery who felt that an acute abscess is unlikely at this time 2 days out from surgery.  No abdominal pain to suggest perforation or new biloma.  SARS-CoV-2 PCR test negative. -Will hold antibiotics at this time although low threshold to start if she continues to be febrile.  Monitor temperature every 4 hours.  Repeat CBC in a.m. to check WBC count.  General surgery will see the patient in the morning.  Borderline hyperkalemia: No acute EKG changes.  Likely related to home ARB and potassium supplement use.  Hold these medications at this time.  Repeat BMP in a.m.  Persistent A. Fib: Currently rate controlled.  Per recent discharge summary, general surgery recommended resuming home Eliquis 2 days postop on 5/6.  Will restart Eliquis.  Followed by cardiology and not on any rate control agents.  PVD: General surgery recommended resuming home Plavix 2 days postop on 5/6.  Will restart Plavix.  Continue home Lipitor.  COPD: No signs of acute exacerbation  at this time.  DuoNebs as needed.  DVT prophylaxis: Subcutaneous heparin Code Status: Unable to discuss CODE STATUS with the patient at this time.  I called and spoke to her son Mr. Cheryl Atkinson.  He has informed me that the patient is DNR.  However, if her respiratory status continued to decline despite being on BiPAP, he would want intubation and mechanical ventilation for her. Family Communication: Son updated. Disposition Plan: Status is: Observation  The patient remains OBS appropriate and will d/c before 2 midnights.  Dispo: The patient is from: Home              Anticipated d/c is to: Home              Anticipated d/c date is: 1 day              Patient currently is not medically stable to d/c.  Consults called: General surgery  The medical decision making on this patient was of high complexity and the patient is at high risk for clinical deterioration, therefore this is a level 3 visit.  John Giovanni MD [facility]  If 7PM-7AM, please contact night-coverage [email or web address]  04/07/2020, 12:44 AM

## 2020-04-07 NOTE — ED Notes (Signed)
Call son when pt moved to room.

## 2020-04-07 NOTE — ED Notes (Signed)
Cheryl Atkinson, sister would like an update on her sister, [phone].

## 2020-04-07 NOTE — Progress Notes (Signed)
ANTICOAGULATION CONSULT NOTE - Follow Up Consult  Pharmacy Consult for Heparin Indication: Atrial fibrillation. Apixaban on hold  Allergies  Allergen Reactions  . Codeine Other (See Comments)    REACTION: Syncope     Patient Measurements: Height: 5\' 6"  (167.6 cm) Weight: 64.5 kg (142 lb 3.2 oz) IBW/kg (Calculated) : 59.3 Heparin Dosing Weight:   Vital Signs: Temp: 98.3 F (36.8 C) (05/07 1940) Temp Source: Oral (05/07 1940) BP: 139/47 (05/07 2100) Pulse Rate: 92 (05/07 2100)  Labs: Recent Labs    04/05/20 0249 04/05/20 0249 04/06/20 1831 04/07/20 0500 04/07/20 1300 04/07/20 2301  HGB 10.0*   < > 10.7* 9.0*  --   --   HCT 32.0*  --  35.5* 30.3*  --   --   PLT 241  --  223 194  --   --   APTT  --   --   --   --  41* 61*  LABPROT  --   --   --   --  16.1*  --   INR  --   --   --   --  1.3*  --   HEPARINUNFRC  --   --   --   --  1.76*  --   CREATININE  --   --  2.32* 2.38*  --   --    < > = values in this interval not displayed.    Estimated Creatinine Clearance: 15.9 mL/min (A) (by C-G formula based on SCr of 2.38 mg/dL (H)).   Medications:  Infusions:  . sodium chloride 125 mL/hr at 04/07/20 2118  . heparin 800 Units/hr (04/07/20 1900)    Assessment: Patient with PTT just below goal.  No heparin issues per RN.  PTT ordered with Heparin level until both correlate due to possible drug-lab interaction between oral anticoagulant (rivaroxaban, edoxaban, or apixaban) and anti-Xa level (aka heparin level)   Goal of Therapy:  Heparin level 0.3-0.7 units/ml aPTT 66-102 seconds Monitor platelets by anticoagulation protocol: Yes   Plan:  Increase heparin to 850 units/hr Recheck PTT/heparin level at 0800  06/07/20 [place] 04/07/2020,11:39 PM

## 2020-04-07 NOTE — Consult Note (Signed)
[facility]  Cheryl Atkinson Apr 25, 1931  [id number].    Requesting MD: Burnadette PopAmrit Adhikari Chief Complaint/Reason for Consult: s/p lap chole  HPI:  Cheryl Atkinson is an 86yo female with multiple medical problems including A fib on eliquis, PVD on plavix, HTN, HLD, COPD who recently underwent laparoscopic cholecystectomy 5/4 by Dr. Janee Mornhompson following percutaneous cholecystostomy tube placement 01/27/20 for acute cholecystitis. Patient is currently on Bipap and no family at bedside, therefore the information in this history was taken from the chart. She was discharged home 5/5 in good condition. Patient was brought to the ED last night after family found her to be lethargic and confused. EMS noted patient was satting 87% on room air and was placed on 2 L nasal cannula.  She reported that her stomach had been hurting but that resolved. No nausea, vomiting, fever, chest pain, SOB. In the ED patient was noted to have respiratory depression. VBG with pH 7.16 and PCO2 68.  Temp 100 F. WBC 20.4, lactic acid normal, hemoglobin 10.7, BUN 44, creatinine 2.3, Lipase and LFTs normal. CT head without acute abnormality. She was given Narcan and started on Bipap with some improvement in respiratory rate and mental status. General surgery asked to see.  Review of Systems  Unable to perform ROS: Other  -on Bipap  Family History  Problem Relation Age of Onset  . Heart disease Father   . Heart attack Father   . Heart disease Mother   . Coronary artery disease Other   . Atrial fibrillation Sister   . Stroke Maternal Grandmother   . Cancer Paternal Grandmother     Past Medical History:  Diagnosis Date  . Anemia   . Arthritis    "shoulders" (05/04/2015)  . Cellulitis of right lower extremity 11/27/2013  . CHF (congestive heart failure) (HCC)   . Chronic lower back pain   . Constipation   . COPD (chronic obstructive pulmonary disease) (HCC)   . GERD (gastroesophageal reflux  disease)   . History of hiatal hernia   . HLD (hyperlipidemia)   . Hypertension   . Insomnia   . Osteoporosis   . Peripheral arterial disease (HCC)   . Restless leg syndrome   . Thyroid disease     Past Surgical History:  Procedure Laterality Date  . ABDOMINAL AORTAGRAM  05/04/2015   Procedure: Abdominal Aortagram;  Surgeon: Runell GessJonathan J Berry, MD;  [facility];  Service: Cardiovascular;;  . APPENDECTOMY    . CATARACT EXTRACTION W/ INTRAOCULAR LENS  IMPLANT, BILATERAL Bilateral   . CHOLECYSTECTOMY N/A 04/04/2020   Procedure: LAPAROSCOPIC CHOLECYSTECTOMY;  Surgeon: Violeta Gelinashompson, Burke, MD;  [facility];  Service: General;  Laterality: N/A;  . I & D EXTREMITY Right 12/22/2016   Procedure: IRRIGATION AND DEBRIDEMENT EXTREMITY;  Surgeon: Mack Hookavid Thompson, MD;  [facility];  Service: Orthopedics;  Laterality: Right;  . IR EXCHANGE BILIARY DRAIN  03/13/2020  . IR PATIENT EVAL TECH 0-60 MINS  12/30/2019  . IR PERC CHOLECYSTOSTOMY  01/27/2020  . PERIPHERAL VASCULAR CATHETERIZATION N/A 05/04/2015   Procedure: Lower Extremity Angiography;  Surgeon: Runell GessJonathan J Berry, MD;  [facility];  Service: Cardiovascular;  Laterality: N/A;  . PERIPHERAL VASCULAR CATHETERIZATION  05/04/2015   Procedure: Peripheral Vascular Intervention;  Surgeon: Runell GessJonathan J Berry, MD;  [facility];  Service: Cardiovascular;;  RCIA - 7x22 ICAST  . PERIPHERAL VASCULAR CATHETERIZATION Right 09/04/2015   Procedure: Peripheral Vascular Atherectomy;  Surgeon: Delton SeeJonathan J  Allyson Sabal, MD;  [facility];  Service: Cardiovascular;  Laterality: Right;  SFA  . sfa Right 09/04/2015   de balloon  . THYROID SURGERY Right ?2013   "had goiter taken off my neck"  . TONSILLECTOMY    . VAGINAL HYSTERECTOMY      Social History:  reports that she quit smoking about 5 years ago. Her smoking use included cigarettes. She has a 30.00 pack-year smoking history. She has never used smokeless tobacco.  She reports that she does not drink alcohol or use drugs.  Allergies:  Allergies  Allergen Reactions  . Codeine Other (See Comments)    REACTION: Syncope     (Not in a hospital admission)   Prior to Admission medications   Medication Sig Start Date End Date Taking? Authorizing Provider  Albuterol Sulfate (PROAIR RESPICLICK) 108 (90 Base) MCG/ACT AEPB Inhale 2 puffs into the lungs every 6 (six) hours as needed (shortness of breath from COPD). 12/29/18   Shelva Majestic, MD  apixaban (ELIQUIS) 5 MG TABS tablet Take 1 tablet (5 mg total) by mouth 2 (two) times daily. Start 04/06/20 04/05/20   Violeta Gelinas, MD  atorvastatin (LIPITOR) 40 MG tablet Take 1 tablet (40 mg total) by mouth daily. 03/16/20   Shelva Majestic, MD  clopidogrel (PLAVIX) 75 MG tablet Take 1 tablet (75 mg total) by mouth daily. Start 04/06/20 04/05/20   Violeta Gelinas, MD  furosemide (LASIX) 40 MG tablet Take 1 tablet (40 mg total) by mouth every other day. 04/06/20   Shelva Majestic, MD  irbesartan (AVAPRO) 300 MG tablet Take 1 tablet (300 mg total) by mouth daily. 03/25/20   Shelva Majestic, MD  Multiple Vitamins-Minerals (PRESERVISION AREDS 2 PO) Take 1 tablet by mouth 2 (two) times daily.    [provider]  NEUPRO 3 MG/24HR PT24 PLACE 1 PATCH (3 MG) ONTO THE SKIN AT BEDTIME Patient taking differently: Apply 1 patch topically at bedtime. Take off in the morning 03/29/20   Shelva Majestic, MD  polyethylene glycol (MIRALAX / GLYCOLAX) 17 g packet Take 17 g by mouth 2 (two) times daily. Reported on 03/14/2016 Patient taking differently: Take 17 g by mouth daily as needed for mild constipation.  01/03/20   Hongalgi, Maximino Greenland, MD  potassium chloride SA (KLOR-CON) 20 MEQ tablet Take 1 tablet (20 mEq total) by mouth daily as needed (take on days that you take lasix). Patient taking differently: Take 20 mEq by mouth every other day.  03/25/20   Shelva Majestic, MD  sodium chloride flush (NS) 0.9 % SOLN 5 mLs by  Intracatheter route every 8 (eight) hours. Patient not taking: Reported on 04/06/2020 01/03/20   Juliet Rude, PA-C  traMADol (ULTRAM) 50 MG tablet Take 50 mg by mouth daily as needed for moderate pain.  02/12/20   [provider]  traZODone (DESYREL) 50 MG tablet Take 1.5 tablets (75 mg total) by mouth at bedtime as needed for sleep. 04/06/20   Shelva Majestic, MD    Blood pressure (!) 114/57, pulse 85, temperature 100 F (37.8 C), temperature source Rectal, resp. rate 12, SpO2 100 %. Physical Exam: General: elderly white female who is laying in bed in NAD on Bipap HEENT: head is normocephalic, atraumatic.  Sclera are noninjected.  Pupils equal and round.  Ears and nose without any masses or lesions.   Heart: regular, rate, and rhythm.  Normal s1,s2. No obvious murmurs, gallops, or rubs noted.  Palpable pedal  pulses bilaterally  Lungs: CTAB, no wheezes, rhonchi, or rales noted.  Respiratory effort nonlabored and O2 sats 100% on Bipap Abd: well healing lap incisions cdi without erythema or drainage, soft, distended, nontender, +BS, no masses, hernias, or organomegaly MS: no BUE/BLE edema, calves soft and nontender Skin: warm and dry with no masses, lesions, or rashes Psych: unable to assess Neuro: moving all 4 extremities and following commands  Results for orders placed or performed during the hospital encounter of 04/06/20 (from the past 48 hour(s))  CBC with Differential/Platelet     Status: Abnormal   Collection Time: 04/06/20  6:31 PM  Result Value Ref Range   WBC 20.4 (H) 4.0 - 10.5 K/uL   RBC 3.85 (L) 3.87 - 5.11 MIL/uL   Hemoglobin 10.7 (L) 12.0 - 15.0 g/dL   HCT 16.9 (L) 67.8 - 93.8 %   MCV 92.2 80.0 - 100.0 fL   MCH 27.8 26.0 - 34.0 pg   MCHC 30.1 30.0 - 36.0 g/dL   RDW 10.1 75.1 - 02.5 %   Platelets 223 150 - 400 K/uL   nRBC 0.0 0.0 - 0.2 %   Neutrophils Relative % 86 %   Neutro Abs 17.5 (H) 1.7 - 7.7 K/uL   Lymphocytes Relative 4 %   Lymphs Abs 0.8 0.7 - 4.0  K/uL   Monocytes Relative 8 %   Monocytes Absolute 1.6 (H) 0.1 - 1.0 K/uL   Eosinophils Relative 0 %   Eosinophils Absolute 0.0 0.0 - 0.5 K/uL   Basophils Relative 0 %   Basophils Absolute 0.1 0.0 - 0.1 K/uL   Immature Granulocytes 2 %   Abs Immature Granulocytes 0.40 (H) 0.00 - 0.07 K/uL    Comment: Performed at [facility], [address], [place], [place] [address]  Comprehensive metabolic panel     Status: Abnormal   Collection Time: 04/06/20  6:31 PM  Result Value Ref Range   Sodium 137 135 - 145 mmol/L   Potassium 5.5 (H) 3.5 - 5.1 mmol/L   Chloride 102 98 - 111 mmol/L   CO2 23 22 - 32 mmol/L   Glucose, Bld 119 (H) 70 - 99 mg/dL    Comment: Glucose reference range applies only to samples taken after fasting for at least 8 hours.   BUN 44 (H) 8 - 23 mg/dL   Creatinine, Ser 8.24 (H) 0.44 - 1.00 mg/dL   Calcium 8.8 (L) 8.9 - 10.3 mg/dL   Total Protein 7.1 6.5 - 8.1 g/dL   Albumin 3.4 (L) 3.5 - 5.0 g/dL   AST 27 15 - 41 U/L   ALT 20 0 - 44 U/L   Alkaline Phosphatase 93 38 - 126 U/L   Total Bilirubin 0.9 0.3 - 1.2 mg/dL   GFR calc non Af Amer 18 (L) >60 mL/min   GFR calc Af Amer 21 (L) >60 mL/min   Anion gap 12 5 - 15    Comment: Performed at [facility], [address], [place], [place] [address]  Lipase, blood     Status: None   Collection Time: 04/06/20  6:31 PM  Result Value Ref Range   Lipase 17 11 - 51 U/L    Comment: Performed at [facility], [address] [place], [place] [address]  Blood gas, venous (at [facility] and [facility], not at [facility])     Status: Abnormal   Collection Time: 04/06/20  6:31 PM  Result Value Ref Range   pH, Peter Minium  7.167 (LL) 7.250 - 7.430    Comment: CRITICAL RESULT CALLED TO, READ BACK BY AND VERIFIED WITH: I.HODGES AT 1909 ON 04/06/20 BY N.THOMPSON    pCO2, Ven 68.1 (H) 44.0 - 60.0 mmHg   pO2, Ven 33.4 32.0 - 45.0 mmHg   Bicarbonate 23.7 20.0 - 28.0 mmol/L   Acid-base deficit 5.3 (H) 0.0 - 2.0  mmol/L   O2 Saturation 45.0 %   Patient temperature 98.6     Comment: Performed at [facility], [address], [place], [place] [address]  Respiratory Panel by RT PCR (Flu A&B, Covid) - Nasopharyngeal Swab     Status: None   Collection Time: 04/06/20  6:37 PM   Specimen: Nasopharyngeal Swab  Result Value Ref Range   SARS Coronavirus 2 by RT PCR NEGATIVE NEGATIVE    Comment: (NOTE) SARS-CoV-2 target nucleic acids are NOT DETECTED. The SARS-CoV-2 RNA is generally detectable in upper respiratoy specimens during the acute phase of infection. The lowest concentration of SARS-CoV-2 viral copies this assay can detect is 131 copies/mL. A negative result does not preclude SARS-Cov-2 infection and should not be used as the sole basis for treatment or other patient management decisions. A negative result may occur with  improper specimen collection/handling, submission of specimen other than nasopharyngeal swab, presence of viral mutation(s) within the areas targeted by this assay, and inadequate number of viral copies (<131 copies/mL). A negative result must be combined with clinical observations, patient history, and epidemiological information. The expected result is Negative. Fact Sheet for Patients:  https://www.moore.com/ Fact Sheet for Healthcare Providers:  https://www.young.biz/ This test is not yet ap proved or cleared by the [place] FDA and  has been authorized for detection and/or diagnosis of SARS-CoV-2 by FDA under an Emergency Use Authorization (EUA). This EUA will remain  in effect (meaning this test can be used) for the duration of the COVID-19 declaration under Section 564(b)(1) of the Act, 21 U.S.C. section 360bbb-3(b)(1), unless the authorization is terminated or revoked sooner.    Influenza A by PCR NEGATIVE NEGATIVE   Influenza B by PCR NEGATIVE NEGATIVE    Comment: (NOTE) The Xpert Xpress  SARS-CoV-2/FLU/RSV assay is intended as an aid in  the diagnosis of influenza from Nasopharyngeal swab specimens and  should not be used as a sole basis for treatment. Nasal washings and  aspirates are unacceptable for Xpert Xpress SARS-CoV-2/FLU/RSV  testing. Fact Sheet for Patients: https://www.moore.com/ Fact Sheet for Healthcare Providers: https://www.young.biz/ This test is not yet approved or cleared by the [place] FDA and  has been authorized for detection and/or diagnosis of SARS-CoV-2 by  FDA under an Emergency Use Authorization (EUA). This EUA will remain  in effect (meaning this test can be used) for the duration of the  Covid-19 declaration under Section 564(b)(1) of the Act, 21  U.S.C. section 360bbb-3(b)(1), unless the authorization is  terminated or revoked. Performed at [facility], [address] [place], [place] [address]   Lactic acid, plasma     Status: None   Collection Time: 04/06/20  8:06 PM  Result Value Ref Range   Lactic Acid, Venous 1.3 0.5 - 1.9 mmol/L    Comment: Performed at [facility], [address], [place], [place] [address]  Urinalysis, Routine w reflex microscopic     Status: Abnormal   Collection Time: 04/06/20  9:50 PM  Result Value Ref Range   Color, Urine YELLOW YELLOW   APPearance HAZY (A) CLEAR  Specific Gravity, Urine 1.014 1.005 - 1.030   pH 5.0 5.0 - 8.0   Glucose, UA NEGATIVE NEGATIVE mg/dL   Hgb urine dipstick NEGATIVE NEGATIVE   Bilirubin Urine NEGATIVE NEGATIVE   Ketones, ur NEGATIVE NEGATIVE mg/dL   Protein, ur NEGATIVE NEGATIVE mg/dL   Nitrite NEGATIVE NEGATIVE   Leukocytes,Ua NEGATIVE NEGATIVE    Comment: Performed at [facility], [address], [place], [place] [address]  Blood gas, venous (at [facility] and [facility], not at [facility])     Status: Abnormal   Collection Time: 04/06/20  9:50 PM  Result Value Ref Range   pH, Ven 7.223 (L)  7.250 - 7.430   pCO2, Ven 61.5 (H) 44.0 - 60.0 mmHg   pO2, Ven <31.0 (LL) 32.0 - 45.0 mmHg    Comment: CRITICAL RESULT CALLED TO, READ BACK BY AND VERIFIED WITH: I.HODGES AT 2213 ON 04/06/20 BY N.THOMPSON    Bicarbonate 24.4 20.0 - 28.0 mmol/L   Acid-base deficit 3.3 (H) 0.0 - 2.0 mmol/L   O2 Saturation 43.8 %   Patient temperature 98.6     Comment: Performed at [facility], [address], [place], [place] [address]  Sodium, urine, random     Status: None   Collection Time: 04/06/20  9:50 PM  Result Value Ref Range   Sodium, Ur 24 mmol/L    Comment: Performed at [facility], [address], [place], [place] [address]  Creatinine, urine, random     Status: None   Collection Time: 04/06/20  9:50 PM  Result Value Ref Range   Creatinine, Urine 130 mg/dL    Comment: Performed at [facility], [address], [place], [place] [address]  Blood gas, arterial     Status: Abnormal   Collection Time: 04/07/20  1:02 AM  Result Value Ref Range   pH, Arterial 7.267 (L) 7.350 - 7.450   pCO2 arterial 50.9 (H) 32.0 - 48.0 mmHg   pO2, Arterial 150 (H) 83.0 - 108.0 mmHg   Bicarbonate 22.5 20.0 - 28.0 mmol/L   Acid-base deficit 4.0 (H) 0.0 - 2.0 mmol/L   O2 Saturation 98.4 %   Patient temperature 98.2    Allens test (pass/fail) PASS PASS    Comment: Performed at [facility], [address], [place], [place] [address]  CBC     Status: Abnormal   Collection Time: 04/07/20  5:00 AM  Result Value Ref Range   WBC 15.5 (H) 4.0 - 10.5 K/uL   RBC 3.24 (L) 3.87 - 5.11 MIL/uL   Hemoglobin 9.0 (L) 12.0 - 15.0 g/dL   HCT 69.6 (L) 78.9 - 38.1 %   MCV 93.5 80.0 - 100.0 fL   MCH 27.8 26.0 - 34.0 pg   MCHC 29.7 (L) 30.0 - 36.0 g/dL   RDW 01.7 51.0 - 25.8 %   Platelets 194 150 - 400 K/uL   nRBC 0.0 0.0 - 0.2 %    Comment: Performed at [facility], [address], [place], [place] [address]  Basic metabolic  panel     Status: Abnormal   Collection Time: 04/07/20  5:00 AM  Result Value Ref Range   Sodium 136 135 - 145 mmol/L   Potassium 5.0 3.5 - 5.1 mmol/L   Chloride 103 98 - 111 mmol/L   CO2 24 22 - 32 mmol/L   Glucose, Bld 97 70 - 99 mg/dL    Comment: Glucose reference range applies only to  samples taken after fasting for at least 8 hours.   BUN 50 (H) 8 - 23 mg/dL   Creatinine, Ser 2.38 (H) 0.44 - 1.00 mg/dL   Calcium 8.3 (L) 8.9 - 10.3 mg/dL   GFR calc non Af Amer 18 (L) >60 mL/min   GFR calc Af Amer 21 (L) >60 mL/min   Anion gap 9 5 - 15    Comment: Performed at [facility], [place] [address], [place], [place] [address]  Blood gas, arterial     Status: Abnormal   Collection Time: 04/07/20  5:55 AM  Result Value Ref Range   FIO2 30.00    Delivery systems BILEVEL POSITIVE AIRWAY PRESSURE    Inspiratory PAP 14    Expiratory PAP 6    pH, Arterial 7.255 (L) 7.350 - 7.450   pCO2 arterial 52.8 (H) 32.0 - 48.0 mmHg   pO2, Arterial 110 (H) 83.0 - 108.0 mmHg   Bicarbonate 22.7 20.0 - 28.0 mmol/L   Acid-base deficit 4.0 (H) 0.0 - 2.0 mmol/L   O2 Saturation 97.2 %   Patient temperature 98.5    Collection site RIGHT RADIAL    Drawn by [id number]    Allens test (pass/fail) PASS PASS    Comment: Performed at [facility], [place] [address], [place], [place] [address]   CT HEAD WO CONTRAST  Result Date: 04/07/2020 CLINICAL DATA:  Encephalopathy.  Decreased responsiveness. EXAM: CT HEAD WITHOUT CONTRAST TECHNIQUE: Contiguous axial images were obtained from the base of the skull through the vertex without intravenous contrast. COMPARISON:  CT head without contrast 08/14/2018 FINDINGS: Brain: Moderate atrophy and diffuse white matter disease is present. No acute infarct, hemorrhage, or mass lesion is present. The ventricles are proportionate to the degree of atrophy. Basal ganglia are intact bilaterally. Cortical ribbon is normal. No significant extra-axial fluid  collection is present. Vascular: Dense atherosclerotic calcifications are present bilaterally. No hyperdense vessel is present. Skull: Calvarium is intact. No focal lytic or blastic lesions are present. No significant extracranial soft tissue lesion is present. Sinuses/Orbits: The paranasal sinuses and mastoid air cells are clear. The globes and orbits are within normal limits. Other: Gas locules within the right temporomandibular fossa may represent a small communication from the oral cavity. The oropharynx is distended with what appears to be positive airway pressure. IMPRESSION: 1. No acute intracranial abnormality or significant interval change. 2. Stable atrophy and white matter disease. 3. Atherosclerosis. 4. Gas locules in the right temporomandibular fossa are unlikely to be of consequence to the patient. This may represent a small communication from the oropharynx or be related to IV placement. Electronically Signed   By: San Morelle M.D.   On: 04/07/2020 05:52   DG Chest Port 1 View  Result Date: 04/06/2020 CLINICAL DATA:  Weakness. EXAM: PORTABLE CHEST 1 VIEW COMPARISON:  02/15/2020 FINDINGS: Enlarged cardiac silhouette with an interval increase in size. Tortuous aorta. Minimal bibasilar linear atelectasis or scarring. Stable proximal right humeral calcified enchondroma or old infarct. IMPRESSION: No acute abnormality. Electronically Signed   By: Claudie Revering M.D.   On: 04/06/2020 18:42   Anti-infectives (From admission, onward)   None      Assessment/Plan HTN HLD A fib on eliquis PVD on plavix COPD  AKI Acute hypoxic hypercarbic respiratory failure/ respiratory depression/ acute encephalopathy  S/p laparoscopic cholecystectomy 04/04/20 Dr. Grandville Silos - Patient is 3 days s/p lap chole and comes in with lethargy and confusion and found to have acute hypoxic hypercarbic respiratory failure  and AKI. She was given narcan and started on Bipap. Her WBC is up but LFTs WNL. Abdominal exam  benign. Low suspicion at this time for abdominal complication such as biliary leak or abscess. Continue resuscitation per primary. We will follow.  ID - none VTE - SCDs, eliquis FEN - HH diet Foley - none Follow up - TBD  Franne Forts, [facility] 04/07/2020, 7:42 AM Please see Amion for pager number during day hours 7:00am-4:30pm

## 2020-04-07 NOTE — Progress Notes (Signed)
ANTICOAGULATION CONSULT NOTE - Initial Consult  Pharmacy Consult for heparin Indication: Atrial fibrillation. Apixaban on hold  Allergies  Allergen Reactions  . Codeine Other (See Comments)    REACTION: Syncope     Patient Measurements:    Ht: 5'6" Wt: 63.5 kg Heparin Dosing Weight: n/a. Use TBW = 63.5 kg  Vital Signs: BP: 124/62 (05/07 1235) Pulse Rate: 86 (05/07 1235)  Labs: Recent Labs    04/05/20 0249 04/05/20 0249 04/06/20 1831 04/07/20 0500  HGB 10.0*   < > 10.7* 9.0*  HCT 32.0*  --  35.5* 30.3*  PLT 241  --  223 194  CREATININE  --   --  2.32* 2.38*   < > = values in this interval not displayed.    Estimated Creatinine Clearance: 15.9 mL/min (A) (by C-G formula based on SCr of 2.38 mg/dL (H)).   Medical History: Past Medical History:  Diagnosis Date  . Anemia   . Arthritis    "shoulders" (05/04/2015)  . Cellulitis of right lower extremity 11/27/2013  . CHF (congestive heart failure) (HCC)   . Chronic lower back pain   . Constipation   . COPD (chronic obstructive pulmonary disease) (HCC)   . GERD (gastroesophageal reflux disease)   . History of hiatal hernia   . HLD (hyperlipidemia)   . Hypertension   . Insomnia   . Osteoporosis   . Peripheral arterial disease (HCC)   . Restless leg syndrome   . Thyroid disease     Medications: Apixaban 5 mg PO BID PTA for atrial fibrillation Pt has not had a dose in several days.  Assessment: Pt presents to ED with lethargy and confusion. PMH significant for atrial fibrillation (on apixaban PTA). Pt recently admitted on 5/4 for cholecystitis and underwent lap chole. Discharged from the hospital on 5/5 with instructions to resume apixaban on 5/6. Patient niece reports to med rec tech that patient did not take any home medications after discharge. Last dose of apixaban several days ago.  Given AKI, anticoagulation being transitioned to heparin drip.   Baseline labs: -Hgb 9, Plt 194 -SCr 0.8 on 5/4. SCr now 2.38,  CrCl ~16 mL/min with AKI -HL and APTT ordered STAT. If baseline HL elevated due to previous DOAC, will need to monitor using APTT.  Goal of Therapy:  Heparin level 0.3-0.7 units/ml aPTT 66-102 seconds Monitor platelets by anticoagulation protocol: Yes   Plan:  No heparin bolus per MD   Initiate heparin infusion at 800 units/hr  Check 8 hour HL/aPTT  HL/APTT/CBC daily while on heparin infusion  Monitor for signs/symptoms of bleeding  Follow along for ability to resume apixaban  Cindi Carbon, PharmD 04/07/2020,12:46 PM

## 2020-04-07 NOTE — Progress Notes (Addendum)
PROGRESS NOTE    Cheryl Atkinson  ZHY:[id number] DOB: Apr 23, 1931 DOA: 04/06/2020 PCP: Shelva Majestic, MD   Brief Narrative: Patient is a 86 year old female with history of persistent A. fib, chronic diastolic CHF, COPD, GERD, hypertension, hyperlipidemia, peripheral vascular disease who was recently admitted here for cholecystitis and underwent laparoscopic cholecystectomy.  She was discharged from here on 04/05/2020 and prescribed tramadol for pain.  Patient was brought back to the emerge department by family because of concern of lethargy, confusion.  He saturated 87% on room air, had to be placed on supplemental oxygen and eventually on BiPAP due to decreased mentation, somnolence.  ABG showed hypercarbia.  She was also febrile on presentation with leukocytosis.  Found to have AKI.  Chest x-ray did not show any acute abnormality.  Also appeared very dehydrated on presentation. General surgery following.  Assessment & Plan:   Principal Problem:   Acute respiratory failure (HCC) Active Problems:   COPD (chronic obstructive pulmonary disease) (HCC)   S/P laparoscopic cholecystectomy   Encephalopathy   AKI (acute kidney injury) (HCC)   Acute hypoxic hypercarbic respiratory failure: Most likely secondary to respiratory depression secondary to tramadol overuse.  She was just discharged from here after cholecystectomy and was prescribed tramadol for pain.  Chest x-ray does not show any acute findings.  She was hypoxic on room air and had to be put on BiPAP.  ABG showed hypercarbia with CO2 of 68.  Mentation is improved after receiving has been placed on BiPAP and given Narcan.  Hold any opiates or sedating medications.  Continue BiPAP for now.  CT head did not show any acute intracranial abnormalities.  AKI: Likely prerenal due to severe dehydration.  Patient has poor urine output.  Her creatinine was normal on 5/4.  Continue IV fluids.  Continue to monitor renal function, urine output.  Renal  ultrasound did not show any acute findings.  Urine sodium is 24.Will request nephrology evaluation if her kidney function does not improve by tomorrow.  Leukocytosis: Unclear etiology.  No signs of infection.  Chest x-ray was clear.  No indication for antibiotic therapy for now.  Acute cholecystitis status post laparoscopic cholecystectomy on 04/04/2020: Presented with significant leukocytosis, fever of 100 F.  Noted hypotension lactacidosis.  General surgery following.  No abdominal pain.  General surgery did not suspect any acute intra-abdominal findings.  Hyperkalemia: Resolved  Permanent A. fib: Currently rate is controlled.  On Eliquis for anticoagulation.  Not on any rate control agents.  Currently she is on heparin drip as Eliquis has been held because of deranged renal function.  Peripheral vascular disease: On Plavix, Lipitor  COPD: Currently stable.  No signs of acute exacerbation.  No wheezing.  Continue bronchodilators as needed.  Hypertension: Currently blood pressure stable.  Continue to monitor.  On irbesartan at home which is on hold.         DVT prophylaxis: iV Heparin Code Status: DNR Family Communication: Called and discussed with  Son on phone on 04/07/20  Status is: inaptient   Dispo: The patient is from: Home              Anticipated d/c is to: Home              Anticipated d/c date is: 2 days              Patient currently is not medically stable to d/c.  Currently on bipap  Consultants: Surgery  Procedures:None  Antimicrobials:  Anti-infectives (From admission,  onward)   None      Subjective: Patient seen and examined at the bedside this morning.  Hemodynamically stable during my evaluation.  She was on BiPAP.  Somnolent but opens her eyes on calling her name and follows commands.  She was not in any acute respiratory distress.  Objective: Vitals:   04/07/20 1230 04/07/20 1235 04/07/20 1300 04/07/20 1404  BP: 124/62 124/62 (!) 128/59 120/60   Pulse: 87 86 76 88  Resp: 15 17 13 15   Temp:      TempSrc:      SpO2: 100% 100% 100% 100%    Intake/Output Summary (Last 24 hours) at 04/07/2020 1432 Last data filed at 04/07/2020 1412 Gross per 24 hour  Intake 1000 ml  Output 400 ml  Net 600 ml   There were no vitals filed for this visit.  Examination:  General exam: Elderly, deconditioned female, somnolent HEENT:PERRL,Oral mucosa moist, Ear/Nose normal on gross exam Respiratory system: Bilateral equal air entry, normal vesicular breath sounds, no wheezes or crackles.  BiPAP Cardiovascular system: A. fib no JVD, murmurs, rubs, gallops or clicks. No pedal edema. Gastrointestinal system: Abdomen is nondistended, soft and nontender. No organomegaly or masses felt. Normal bowel sounds heard. Central nervous system: Alert and awake but sleepy/drowsy. Extremities: No edema, no clubbing ,no cyanosis, distal peripheral pulses palpable. Skin: No rashes, lesions or ulcers,no icterus ,no pallor   Data Reviewed: I have personally reviewed following labs and imaging studies  CBC: Recent Labs  Lab 04/04/20 0847 04/05/20 0249 04/06/20 1831 04/07/20 0500  WBC 5.7 11.2* 20.4* 15.5*  NEUTROABS  --   --  17.5*  --   HGB 11.5* 10.0* 10.7* 9.0*  HCT 37.2 32.0* 35.5* 30.3*  MCV 89.0 88.6 92.2 93.5  PLT 237 241 223 440   Basic Metabolic Panel: Recent Labs  Lab 04/04/20 0847 04/06/20 1831 04/07/20 0500  NA 139 137 136  K 4.2 5.5* 5.0  CL 108 102 103  CO2 22 23 24   GLUCOSE 100* 119* 97  BUN 27* 44* 50*  CREATININE 0.76 2.32* 2.38*  CALCIUM 9.2 8.8* 8.3*   GFR: Estimated Creatinine Clearance: 15.9 mL/min (A) (by C-G formula based on SCr of 2.38 mg/dL (H)). Liver Function Tests: Recent Labs  Lab 04/04/20 0847 04/06/20 1831  AST 16 27  ALT 14 20  ALKPHOS 87 93  BILITOT 0.5 0.9  PROT 7.4 7.1  ALBUMIN 3.8 3.4*   Recent Labs  Lab 04/06/20 1831  LIPASE 17   No results for input(s): AMMONIA in the last 168 hours.  Coagulation Profile: Recent Labs  Lab 04/04/20 0847 04/07/20 1300  INR 1.1 1.3*   Cardiac Enzymes: No results for input(s): CKTOTAL, CKMB, CKMBINDEX, TROPONINI in the last 168 hours. BNP (last 3 results) Recent Labs    02/11/20 0936  PROBNP 228.0*   HbA1C: No results for input(s): HGBA1C in the last 72 hours. CBG: No results for input(s): GLUCAP in the last 168 hours. Lipid Profile: No results for input(s): CHOL, HDL, LDLCALC, TRIG, CHOLHDL, LDLDIRECT in the last 72 hours. Thyroid Function Tests: No results for input(s): TSH, T4TOTAL, FREET4, T3FREE, THYROIDAB in the last 72 hours. Anemia Panel: No results for input(s): VITAMINB12, FOLATE, FERRITIN, TIBC, IRON, RETICCTPCT in the last 72 hours. Sepsis Labs: Recent Labs  Lab 04/06/20 2006  LATICACIDVEN 1.3    Recent Results (from the past 240 hour(s))  SARS CORONAVIRUS 2 (TAT 6-24 HRS) Nasopharyngeal Nasopharyngeal Swab     Status: None   Collection  Time: 03/31/20 12:54 PM   Specimen: Nasopharyngeal Swab  Result Value Ref Range Status   SARS Coronavirus 2 NEGATIVE NEGATIVE Final    Comment: (NOTE) SARS-CoV-2 target nucleic acids are NOT DETECTED. The SARS-CoV-2 RNA is generally detectable in upper and lower respiratory specimens during the acute phase of infection. Negative results do not preclude SARS-CoV-2 infection, do not rule out co-infections with other pathogens, and should not be used as the sole basis for treatment or other patient management decisions. Negative results must be combined with clinical observations, patient history, and epidemiological information. The expected result is Negative. Fact Sheet for Patients: HairSlick.no Fact Sheet for Healthcare Providers: quierodirigir.com This test is not yet approved or cleared by the [place] FDA and  has been authorized for detection and/or diagnosis of SARS-CoV-2 by FDA under an Emergency Use  Authorization (EUA). This EUA will remain  in effect (meaning this test can be used) for the duration of the COVID-19 declaration under Section 56 4(b)(1) of the Act, 21 U.S.C. section 360bbb-3(b)(1), unless the authorization is terminated or revoked sooner. Performed at [facility], [address] [place], [place] [address]   Respiratory Panel by RT PCR (Flu A&B, Covid) - Nasopharyngeal Swab     Status: None   Collection Time: 04/06/20  6:37 PM   Specimen: Nasopharyngeal Swab  Result Value Ref Range Status   SARS Coronavirus 2 by RT PCR NEGATIVE NEGATIVE Final    Comment: (NOTE) SARS-CoV-2 target nucleic acids are NOT DETECTED. The SARS-CoV-2 RNA is generally detectable in upper respiratoy specimens during the acute phase of infection. The lowest concentration of SARS-CoV-2 viral copies this assay can detect is 131 copies/mL. A negative result does not preclude SARS-Cov-2 infection and should not be used as the sole basis for treatment or other patient management decisions. A negative result may occur with  improper specimen collection/handling, submission of specimen other than nasopharyngeal swab, presence of viral mutation(s) within the areas targeted by this assay, and inadequate number of viral copies (<131 copies/mL). A negative result must be combined with clinical observations, patient history, and epidemiological information. The expected result is Negative. Fact Sheet for Patients:  https://www.moore.com/ Fact Sheet for Healthcare Providers:  https://www.young.biz/ This test is not yet ap proved or cleared by the [place] FDA and  has been authorized for detection and/or diagnosis of SARS-CoV-2 by FDA under an Emergency Use Authorization (EUA). This EUA will remain  in effect (meaning this test can be used) for the duration of the COVID-19 declaration under Section 564(b)(1) of the Act, 21 U.S.C. section 360bbb-3(b)(1),  unless the authorization is terminated or revoked sooner.    Influenza A by PCR NEGATIVE NEGATIVE Final   Influenza B by PCR NEGATIVE NEGATIVE Final    Comment: (NOTE) The Xpert Xpress SARS-CoV-2/FLU/RSV assay is intended as an aid in  the diagnosis of influenza from Nasopharyngeal swab specimens and  should not be used as a sole basis for treatment. Nasal washings and  aspirates are unacceptable for Xpert Xpress SARS-CoV-2/FLU/RSV  testing. Fact Sheet for Patients: https://www.moore.com/ Fact Sheet for Healthcare Providers: https://www.young.biz/ This test is not yet approved or cleared by the [place] FDA and  has been authorized for detection and/or diagnosis of SARS-CoV-2 by  FDA under an Emergency Use Authorization (EUA). This EUA will remain  in effect (meaning this test can be used) for the duration of the  Covid-19 declaration under Section 564(b)(1) of the Act, 21  U.S.C. section 360bbb-3(b)(1), unless the  authorization is  terminated or revoked. Performed at [facility], [address], [place], [place] [address]          Radiology Studies: CT HEAD WO CONTRAST  Result Date: 04/07/2020 CLINICAL DATA:  Encephalopathy.  Decreased responsiveness. EXAM: CT HEAD WITHOUT CONTRAST TECHNIQUE: Contiguous axial images were obtained from the base of the skull through the vertex without intravenous contrast. COMPARISON:  CT head without contrast 08/14/2018 FINDINGS: Brain: Moderate atrophy and diffuse white matter disease is present. No acute infarct, hemorrhage, or mass lesion is present. The ventricles are proportionate to the degree of atrophy. Basal ganglia are intact bilaterally. Cortical ribbon is normal. No significant extra-axial fluid collection is present. Vascular: Dense atherosclerotic calcifications are present bilaterally. No hyperdense vessel is present. Skull: Calvarium is intact. No focal lytic or blastic  lesions are present. No significant extracranial soft tissue lesion is present. Sinuses/Orbits: The paranasal sinuses and mastoid air cells are clear. The globes and orbits are within normal limits. Other: Gas locules within the right temporomandibular fossa may represent a small communication from the oral cavity. The oropharynx is distended with what appears to be positive airway pressure. IMPRESSION: 1. No acute intracranial abnormality or significant interval change. 2. Stable atrophy and white matter disease. 3. Atherosclerosis. 4. Gas locules in the right temporomandibular fossa are unlikely to be of consequence to the patient. This may represent a small communication from the oropharynx or be related to IV placement. Electronically Signed   By: Marin Roberts M.D.   On: 04/07/2020 05:52   US RENAL  Result Date: 04/07/2020 CLINICAL DATA:  Acute kidney injury. EXAM: RENAL / URINARY TRACT ULTRASOUND COMPLETE COMPARISON:  CT scan of the abdomen dated 01/25/2020 FINDINGS: Right Kidney: Renal measurements: 8.9 x 4.7 x 4.9 cm = volume: 107 mL . Echogenicity within normal limits. No solid mass or hydronephrosis visualized. 1.4 cm cyst in upper pole. Left Kidney: Renal measurements: 10.3 x 5.1 x 4.5 cm. = volume: 125 mL. Echogenicity within normal limits. No mass or hydronephrosis visualized. Bladder: Multiple bladder diverticula. Otherwise normal. Ureteral jets are identified. Prevoid volume is 265 cc. Other: None. IMPRESSION: No significant abnormality of the kidneys.  Bladder diverticula. Electronically Signed   By: Francene Boyers M.D.   On: 04/07/2020 07:59   DG Chest Port 1 View  Result Date: 04/06/2020 CLINICAL DATA:  Weakness. EXAM: PORTABLE CHEST 1 VIEW COMPARISON:  02/15/2020 FINDINGS: Enlarged cardiac silhouette with an interval increase in size. Tortuous aorta. Minimal bibasilar linear atelectasis or scarring. Stable proximal right humeral calcified enchondroma or old infarct. IMPRESSION: No  acute abnormality. Electronically Signed   By: Beckie Salts M.D.   On: 04/06/2020 18:42        Scheduled Meds: . atorvastatin  40 mg Oral Daily  . clopidogrel  75 mg Oral Daily   Continuous Infusions: . heparin       LOS: 0 days    Time spent: 35 mins,More than 50% of that time was spent in counseling and/or coordination of care.      Burnadette Pop, MD [facility] P5/06/2020, 2:32 PM

## 2020-04-08 ENCOUNTER — Inpatient Hospital Stay (HOSPITAL_COMMUNITY): Payer: Medicare Other

## 2020-04-08 LAB — BASIC METABOLIC PANEL
Anion gap: 11 (ref 5–15)
BUN: 53 mg/dL — ABNORMAL HIGH (ref 8–23)
CO2: 20 mmol/L — ABNORMAL LOW (ref 22–32)
Calcium: 8.1 mg/dL — ABNORMAL LOW (ref 8.9–10.3)
Chloride: 109 mmol/L (ref 98–111)
Creatinine, Ser: 1.66 mg/dL — ABNORMAL HIGH (ref 0.44–1.00)
GFR calc Af Amer: 32 mL/min — ABNORMAL LOW (ref >60)
GFR calc non Af Amer: 28 mL/min — ABNORMAL LOW (ref >60)
Glucose, Bld: 84 mg/dL (ref 70–99)
Potassium: 4.5 mmol/L (ref 3.5–5.1)
Sodium: 140 mmol/L (ref 135–145)

## 2020-04-08 LAB — CBC WITH DIFFERENTIAL/PLATELET
Abs Immature Granulocytes: 0.11 10*3/uL — ABNORMAL HIGH (ref 0.00–0.07)
Basophils Absolute: 0 10*3/uL (ref 0.0–0.1)
Basophils Relative: 0 %
Eosinophils Absolute: 0 10*3/uL (ref 0.0–0.5)
Eosinophils Relative: 0 %
HCT: 30.3 % — ABNORMAL LOW (ref 36.0–46.0)
Hemoglobin: 9.3 g/dL — ABNORMAL LOW (ref 12.0–15.0)
Immature Granulocytes: 1 %
Lymphocytes Relative: 6 %
Lymphs Abs: 0.8 10*3/uL (ref 0.7–4.0)
MCH: 28.4 pg (ref 26.0–34.0)
MCHC: 30.7 g/dL (ref 30.0–36.0)
MCV: 92.7 fL (ref 80.0–100.0)
Monocytes Absolute: 0.9 10*3/uL (ref 0.1–1.0)
Monocytes Relative: 7 %
Neutro Abs: 11 10*3/uL — ABNORMAL HIGH (ref 1.7–7.7)
Neutrophils Relative %: 86 %
Platelets: 206 10*3/uL (ref 150–400)
RBC: 3.27 MIL/uL — ABNORMAL LOW (ref 3.87–5.11)
RDW: 14.4 % (ref 11.5–15.5)
WBC: 12.9 10*3/uL — ABNORMAL HIGH (ref 4.0–10.5)
nRBC: 0 % (ref 0.0–0.2)

## 2020-04-08 LAB — APTT
aPTT: 54 seconds — ABNORMAL HIGH (ref 24–36)
aPTT: 65 seconds — ABNORMAL HIGH (ref 24–36)

## 2020-04-08 LAB — GLUCOSE, CAPILLARY: Glucose-Capillary: 108 mg/dL — ABNORMAL HIGH (ref 70–99)

## 2020-04-08 LAB — HEPARIN LEVEL (UNFRACTIONATED): Heparin Unfractionated: 1.32 IU/mL — ABNORMAL HIGH (ref 0.30–0.70)

## 2020-04-08 MED ORDER — HEPARIN (PORCINE) 25000 UT/250ML-% IV SOLN
1100.0000 [IU]/h | INTRAVENOUS | Status: DC
  Administered 2020-04-08: 1100 [IU]/h via INTRAVENOUS

## 2020-04-08 MED ORDER — IOHEXOL 9 MG/ML PO SOLN
ORAL | Status: AC
  Administered 2020-04-08: 500 mL
  Filled 2020-04-08: qty 1000

## 2020-04-08 MED ORDER — LABETALOL HCL 5 MG/ML IV SOLN
10.0000 mg | INTRAVENOUS | Status: DC | PRN
  Administered 2020-04-09 (×3): 10 mg via INTRAVENOUS
  Filled 2020-04-08 (×3): qty 4

## 2020-04-08 MED ORDER — HEPARIN (PORCINE) 25000 UT/250ML-% IV SOLN
1000.0000 [IU]/h | INTRAVENOUS | Status: DC
  Administered 2020-04-08: 1000 [IU]/h via INTRAVENOUS

## 2020-04-08 NOTE — Progress Notes (Signed)
Subjective/Chief Complaint: Feels a little better this am. Breathing more comfortably   Objective: Vital signs in last 24 hours: Temp:  [98.1 F (36.7 C)-98.9 F (37.2 C)] 98.1 F (36.7 C) (05/08 0346) Pulse Rate:  [76-97] 94 (05/08 0600) Resp:  [8-18] 15 (05/08 0600) BP: (114-174)/(46-99) 174/62 (05/08 0600) SpO2:  [94 %-100 %] 95 % (05/08 0600) Weight:  [64.5 kg] 64.5 kg (05/07 1621) Last BM Date: 04/04/20  Intake/Output from previous day: 05/07 0701 - 05/08 0700 In: 3462.9 [I.V.:3462.9] Out: 1250 [Urine:1250] Intake/Output this shift: No intake/output data recorded.  General appearance: alert and cooperative Resp: clear to auscultation bilaterally Cardio: regular rate and rhythm GI: soft, moderate tenderness to palpation  Lab Results:  Recent Labs    04/07/20 0500 04/08/20 0520  WBC 15.5* 12.9*  HGB 9.0* 9.3*  HCT 30.3* 30.3*  PLT 194 206   BMET Recent Labs    04/07/20 0500 04/08/20 0520  NA 136 140  K 5.0 4.5  CL 103 109  CO2 24 20*  GLUCOSE 97 84  BUN 50* 53*  CREATININE 2.38* 1.66*  CALCIUM 8.3* 8.1*   PT/INR Recent Labs    04/07/20 1300  LABPROT 16.1*  INR 1.3*   ABG Recent Labs    04/07/20 0102 04/07/20 0555  PHART 7.267* 7.255*  HCO3 22.5 22.7    Studies/Results: CT HEAD WO CONTRAST  Result Date: 04/07/2020 CLINICAL DATA:  Encephalopathy.  Decreased responsiveness. EXAM: CT HEAD WITHOUT CONTRAST TECHNIQUE: Contiguous axial images were obtained from the base of the skull through the vertex without intravenous contrast. COMPARISON:  CT head without contrast 08/14/2018 FINDINGS: Brain: Moderate atrophy and diffuse white matter disease is present. No acute infarct, hemorrhage, or mass lesion is present. The ventricles are proportionate to the degree of atrophy. Basal ganglia are intact bilaterally. Cortical ribbon is normal. No significant extra-axial fluid collection is present. Vascular: Dense atherosclerotic calcifications are  present bilaterally. No hyperdense vessel is present. Skull: Calvarium is intact. No focal lytic or blastic lesions are present. No significant extracranial soft tissue lesion is present. Sinuses/Orbits: The paranasal sinuses and mastoid air cells are clear. The globes and orbits are within normal limits. Other: Gas locules within the right temporomandibular fossa may represent a small communication from the oral cavity. The oropharynx is distended with what appears to be positive airway pressure. IMPRESSION: 1. No acute intracranial abnormality or significant interval change. 2. Stable atrophy and white matter disease. 3. Atherosclerosis. 4. Gas locules in the right temporomandibular fossa are unlikely to be of consequence to the patient. This may represent a small communication from the oropharynx or be related to IV placement. Electronically Signed   By: Marin Roberts M.D.   On: 04/07/2020 05:52   US RENAL  Result Date: 04/07/2020 CLINICAL DATA:  Acute kidney injury. EXAM: RENAL / URINARY TRACT ULTRASOUND COMPLETE COMPARISON:  CT scan of the abdomen dated 01/25/2020 FINDINGS: Right Kidney: Renal measurements: 8.9 x 4.7 x 4.9 cm = volume: 107 mL . Echogenicity within normal limits. No solid mass or hydronephrosis visualized. 1.4 cm cyst in upper pole. Left Kidney: Renal measurements: 10.3 x 5.1 x 4.5 cm. = volume: 125 mL. Echogenicity within normal limits. No mass or hydronephrosis visualized. Bladder: Multiple bladder diverticula. Otherwise normal. Ureteral jets are identified. Prevoid volume is 265 cc. Other: None. IMPRESSION: No significant abnormality of the kidneys.  Bladder diverticula. Electronically Signed   By: Francene Boyers M.D.   On: 04/07/2020 07:59   DG Chest Port 1  View  Result Date: 04/06/2020 CLINICAL DATA:  Weakness. EXAM: PORTABLE CHEST 1 VIEW COMPARISON:  02/15/2020 FINDINGS: Enlarged cardiac silhouette with an interval increase in size. Tortuous aorta. Minimal bibasilar linear  atelectasis or scarring. Stable proximal right humeral calcified enchondroma or old infarct. IMPRESSION: No acute abnormality. Electronically Signed   By: Claudie Revering M.D.   On: 04/06/2020 18:42    Anti-infectives: Anti-infectives (From admission, onward)   None      Assessment/Plan: s/p * No surgery found * POD 4 from lap chole  Not sure what caused her decompensation but she is improving. Would recommend CT abd pel to rule out a complication of recent surgery Will follow  LOS: 1 day    Autumn Messing III 04/08/2020

## 2020-04-08 NOTE — Progress Notes (Signed)
ANTICOAGULATION CONSULT NOTE Pharmacy Consult for heparin Indication: Atrial fibrillation. Apixaban on hold  Allergies  Allergen Reactions  . Codeine Other (See Comments)    REACTION: Syncope     Patient Measurements: Height: 5\' 6"  (167.6 cm) Weight: 64.5 kg (142 lb 3.2 oz) IBW/kg (Calculated) : 59.3  Ht: 5'6" Wt: 63.5 kg Heparin Dosing Weight: n/a. Use TBW = 63.5 kg  Vital Signs: Temp: 97 F (36.1 C) (05/08 1600) Temp Source: Axillary (05/08 1600) BP: 158/73 (05/08 1700) Pulse Rate: 98 (05/08 1700)  Labs: Recent Labs    04/06/20 1831 04/06/20 1831 04/07/20 0500 04/07/20 1300 04/07/20 1300 04/07/20 2301 04/08/20 0520 04/08/20 1701  HGB 10.7*   < > 9.0*  --   --   --  9.3*  --   HCT 35.5*  --  30.3*  --   --   --  30.3*  --   PLT 223  --  194  --   --   --  206  --   APTT  --   --   --  41*   < > 61* 54* 65*  LABPROT  --   --   --  16.1*  --   --   --   --   INR  --   --   --  1.3*  --   --   --   --   HEPARINUNFRC  --   --   --  1.76*  --   --  1.32*  --   CREATININE 2.32*  --  2.38*  --   --   --  1.66*  --    < > = values in this interval not displayed.    Estimated Creatinine Clearance: 22.8 mL/min (A) (by C-G formula based on SCr of 1.66 mg/dL (H)).  Medications: Apixaban 5 mg PO BID PTA for atrial fibrillation Pt has not had a dose in several days.  Assessment: Pt presents to ED with lethargy and confusion. PMH significant for atrial fibrillation (on apixaban PTA). Pt recently admitted on 5/4 for cholecystitis and underwent lap chole. Discharged from the hospital on 5/5 with instructions to resume apixaban on 5/6. Patient niece reports to med rec tech that patient did not take any home medications after discharge. Last dose of apixaban several days ago.  Given AKI, anticoagulation being transitioned to heparin drip.   Baseline labs: -Hgb 9, Plt 194 -SCr 0.8 on 5/4. SCr now 2.38, CrCl ~16 mL/min with AKI -HL 1.75  and APTT = 41. Since baseline HL  elevated due to previous DOAC, will need to monitor using APTT.  04/08/2020  Labs drawn at 0520 instead of 08 am as ordered so not steady state, APTT 54 which is below goal.  Heparin level is still pending but will be elevated due to PTA apixaban IV infusing without interruptions per RN No bleeding per RN Hg 9.3, stable, PLTC 206 stable SCr down to 1.66  PM update  APTT is 65 sec, slightly subtherapeutic  Goal of Therapy:  Heparin level 0.3-0.7 units/ml aPTT 66-102 seconds Monitor platelets by anticoagulation protocol: Yes   Plan:  No heparin bolus per MD  Increase heparin infusion to 1100 units/hr  Check 8 hour HL/aPTT  HL/APTT/CBC daily while on heparin infusion  Monitor for signs/symptoms of bleeding  Follow along for ability to resume apixaban   06/08/2020, PharmD, BCPS 04/08/2020 5:57 PM

## 2020-04-08 NOTE — Progress Notes (Signed)
ANTICOAGULATION CONSULT NOTE Pharmacy Consult for heparin Indication: Atrial fibrillation. Apixaban on hold  Allergies  Allergen Reactions  . Codeine Other (See Comments)    REACTION: Syncope     Patient Measurements: Height: 5\' 6"  (167.6 cm) Weight: 64.5 kg (142 lb 3.2 oz) IBW/kg (Calculated) : 59.3  Ht: 5'6" Wt: 63.5 kg Heparin Dosing Weight: n/a. Use TBW = 63.5 kg  Vital Signs: Temp: 98.1 F (36.7 C) (05/08 0346) Temp Source: Oral (05/08 0346) BP: 174/62 (05/08 0600) Pulse Rate: 94 (05/08 0600)  Labs: Recent Labs    04/06/20 1831 04/06/20 1831 04/07/20 0500 04/07/20 1300 04/07/20 2301 04/08/20 0520  HGB 10.7*   < > 9.0*  --   --  9.3*  HCT 35.5*  --  30.3*  --   --  30.3*  PLT 223  --  194  --   --  206  APTT  --   --   --  41* 61* 54*  LABPROT  --   --   --  16.1*  --   --   INR  --   --   --  1.3*  --   --   HEPARINUNFRC  --   --   --  1.76*  --   --   CREATININE 2.32*  --  2.38*  --   --  1.66*   < > = values in this interval not displayed.    Estimated Creatinine Clearance: 22.8 mL/min (A) (by C-G formula based on SCr of 1.66 mg/dL (H)).  Medications: Apixaban 5 mg PO BID PTA for atrial fibrillation Pt has not had a dose in several days.  Assessment: Pt presents to ED with lethargy and confusion. PMH significant for atrial fibrillation (on apixaban PTA). Pt recently admitted on 5/4 for cholecystitis and underwent lap chole. Discharged from the hospital on 5/5 with instructions to resume apixaban on 5/6. Patient niece reports to med rec tech that patient did not take any home medications after discharge. Last dose of apixaban several days ago.  Given AKI, anticoagulation being transitioned to heparin drip.   Baseline labs: -Hgb 9, Plt 194 -SCr 0.8 on 5/4. SCr now 2.38, CrCl ~16 mL/min with AKI -HL 1.75  and APTT = 41. Since baseline HL elevated due to previous DOAC, will need to monitor using APTT.  04/08/2020  Labs drawn at 0520 instead of 08 am as  ordered so not steady state, APTT 54 which is below goal.  Heparin level is still pending but will be elevated due to PTA apixaban IV infusing without interruptions per RN No bleeding per RN Hg 9.3, stable, PLTC 206 stable SCr down to 1.66  Goal of Therapy:  Heparin level 0.3-0.7 units/ml aPTT 66-102 seconds Monitor platelets by anticoagulation protocol: Yes   Plan:  No heparin bolus per MD  Increase heparin infusion to1000 units/hr  Check 8 hour HL/aPTT  HL/APTT/CBC daily while on heparin infusion  Monitor for signs/symptoms of bleeding  Follow along for ability to resume apixaban  06/08/2020, Pharm.D 04/08/2020 7:48 AM

## 2020-04-08 NOTE — Progress Notes (Signed)
Pt. taken off BiPAP just prior to my arrival, tolerated well, currently on room air @ baseline.

## 2020-04-08 NOTE — Progress Notes (Signed)
PROGRESS NOTE    Cheryl Atkinson  QIO:[id number] DOB: 08-12-1931 DOA: 04/06/2020 PCP: Shelva MajesticHunter, Cheryl O, MD   Brief Narrative: Patient is a 86 year old female with history of persistent A. fib, chronic diastolic CHF, COPD, GERD, hypertension, hyperlipidemia, peripheral vascular disease who was recently admitted here for cholecystitis and underwent laparoscopic cholecystectomy.  She was discharged from here on 04/05/2020 and prescribed tramadol for pain.  Patient was brought back to the emerge department by family because of concern of lethargy, confusion.  He saturated 87% on room air, had to be placed on supplemental oxygen and eventually on BiPAP due to decreased mentation, somnolence.  ABG showed hypercarbia.  She was also febrile on presentation with leukocytosis.  Found to have AKI.  Chest x-ray did not show any acute abnormality.  Also appeared very dehydrated on presentation. She had to be started on Narcan as needed for opioid toxicity. Today she is on room air. General surgery following.  Assessment & Plan:   Principal Problem:   Acute respiratory failure (HCC) Active Problems:   COPD (chronic obstructive pulmonary disease) (HCC)   S/P laparoscopic cholecystectomy   Encephalopathy   AKI (acute kidney injury) (HCC)   Acute respiratory failure with hypoxia (HCC)   Acute hypoxic hypercarbic respiratory failure: Most likely secondary to respiratory depression secondary to tramadol overuse.  She was just discharged from here after cholecystectomy and was prescribed tramadol for pain.  Chest x-ray does not show any acute findings.  She was hypoxic on room air and had to be put on BiPAP.  ABG showed hypercarbia with CO2 of 68.  Mentation is improved after receiving has been placed on BiPAP and given Narcan.  Hold any opiates or sedating medications.    CT head did not show any acute intracranial abnormalities. Mental and respiratory status improved after Narcan doses.  Currently she is  maintaining her saturation on room air.  Off BiPAP.  AKI: Improving.Likely prerenal due to severe dehydration.  Her creatinine was normal on 04/04/20.  Continue IV fluids.  Continue to monitor renal function, urine output.  Renal ultrasound did not show any acute findings.  Urine sodium is 24.Foley was placed on admission.  Will discontinue.  Leukocytosis: Improving.Unclear etiology.  No signs of infection.  Chest x-ray was clear.  No indication for antibiotic therapy for now.  Acute cholecystitis status post laparoscopic cholecystectomy on 04/04/2020: Presented with significant leukocytosis, fever of 100 F.  Noted hypotension on arrival, lactic cidosis.  General surgery following.  No abdominal pain.  General surgery did not suspect any acute intra-abdominal findings.  Now hemodynamically stable. General surgery ordered CT abdomen/pelvis to rule out if there is any complication of recent surgery.  Hyperkalemia: Resolved  Permanent A. fib: Currently rate is controlled.  On Eliquis for anticoagulation.  Not on any rate control agents.  Currently she is on heparin drip as Eliquis has been held because of deranged renal function.  We will resume Eliquis after further improvement in the kidney function.  Peripheral vascular disease: On Plavix, Lipitor  COPD: Currently stable.  No signs of acute exacerbation.  No wheezing.  Continue bronchodilators as needed.  Hypertension: Hypertensive today.  Continue as needed meds.  On ARB at home which is on hold due to AKI.  Continue to monitor.           DVT prophylaxis: iV Heparin Code Status: DNR Family Communication: Called and discussed with  Son on phone on 04/07/20  Status is: inaptient   Dispo: The patient  is from: Home              Anticipated d/c is to: Home              Anticipated d/c date is:1 day              Patient currently is not medically stable to d/c.  She needs to continue on IV fluids for AKI.  Currently on bipap  Consultants:  Surgery  Procedures:None  Antimicrobials:  Anti-infectives (From admission, onward)   None      Subjective: Patient seen and examined at the bedside this morning.  hemidynamically stable.  Looks much better today.  Alert and oriented.  Denies any complaints.  Objective: Vitals:   04/08/20 0346 04/08/20 0400 04/08/20 0500 04/08/20 0600  BP:  (!) 144/49 (!) 132/46 (!) 174/62  Pulse:  97 96 94  Resp:  16 13 15   Temp: 98.1 F (36.7 C)     TempSrc: Oral     SpO2:  98% 94% 95%  Weight:      Height:        Intake/Output Summary (Last 24 hours) at 04/08/2020 0724 Last data filed at 04/08/2020 0529 Gross per 24 hour  Intake 3462.94 ml  Output 1250 ml  Net 2212.94 ml   Filed Weights   04/07/20 1621  Weight: 64.5 kg    Examination:  General exam: Comfortable HEENT:PERRL,Oral mucosa moist, Ear/Nose normal on gross exam Respiratory system: Bilateral equal air entry, normal vesicular breath sounds, no wheezes or crackles  Cardiovascular system: S1 & S2 heard, RRR. No JVD, murmurs, rubs, gallops or clicks. Gastrointestinal system: Abdomen is nondistended, soft and nontender. No organomegaly or masses felt. Normal bowel sounds heard. Central nervous system: Alert and oriented. No focal neurological deficits. Extremities: No edema, no clubbing ,no cyanosis, distal peripheral pulses palpable. Skin: No rashes, lesions or ulcers,no icterus ,no pallor   Data Reviewed: I have personally reviewed following labs and imaging studies  CBC: Recent Labs  Lab 04/04/20 0847 04/05/20 0249 04/06/20 1831 04/07/20 0500 04/08/20 0520  WBC 5.7 11.2* 20.4* 15.5* 12.9*  NEUTROABS  --   --  17.5*  --  11.0*  HGB 11.5* 10.0* 10.7* 9.0* 9.3*  HCT 37.2 32.0* 35.5* 30.3* 30.3*  MCV 89.0 88.6 92.2 93.5 92.7  PLT 237 241 223 194 324   Basic Metabolic Panel: Recent Labs  Lab 04/04/20 0847 04/06/20 1831 04/07/20 0500 04/08/20 0520  NA 139 137 136 140  K 4.2 5.5* 5.0 4.5  CL 108 102 103 109    CO2 22 23 24  20*  GLUCOSE 100* 119* 97 84  BUN 27* 44* 50* 53*  CREATININE 0.76 2.32* 2.38* 1.66*  CALCIUM 9.2 8.8* 8.3* 8.1*   GFR: Estimated Creatinine Clearance: 22.8 mL/min (A) (by C-G formula based on SCr of 1.66 mg/dL (H)). Liver Function Tests: Recent Labs  Lab 04/04/20 0847 04/06/20 1831  AST 16 27  ALT 14 20  ALKPHOS 87 93  BILITOT 0.5 0.9  PROT 7.4 7.1  ALBUMIN 3.8 3.4*   Recent Labs  Lab 04/06/20 1831  LIPASE 17   No results for input(s): AMMONIA in the last 168 hours. Coagulation Profile: Recent Labs  Lab 04/04/20 0847 04/07/20 1300  INR 1.1 1.3*   Cardiac Enzymes: No results for input(s): CKTOTAL, CKMB, CKMBINDEX, TROPONINI in the last 168 hours. BNP (last 3 results) Recent Labs    02/11/20 0936  PROBNP 228.0*   HbA1C: No results for input(s): HGBA1C in  the last 72 hours. CBG: No results for input(s): GLUCAP in the last 168 hours. Lipid Profile: No results for input(s): CHOL, HDL, LDLCALC, TRIG, CHOLHDL, LDLDIRECT in the last 72 hours. Thyroid Function Tests: No results for input(s): TSH, T4TOTAL, FREET4, T3FREE, THYROIDAB in the last 72 hours. Anemia Panel: No results for input(s): VITAMINB12, FOLATE, FERRITIN, TIBC, IRON, RETICCTPCT in the last 72 hours. Sepsis Labs: Recent Labs  Lab 04/06/20 2006  LATICACIDVEN 1.3    Recent Results (from the past 240 hour(s))  SARS CORONAVIRUS 2 (TAT 6-24 HRS) Nasopharyngeal Nasopharyngeal Swab     Status: None   Collection Time: 03/31/20 12:54 PM   Specimen: Nasopharyngeal Swab  Result Value Ref Range Status   SARS Coronavirus 2 NEGATIVE NEGATIVE Final    Comment: (NOTE) SARS-CoV-2 target nucleic acids are NOT DETECTED. The SARS-CoV-2 RNA is generally detectable in upper and lower respiratory specimens during the acute phase of infection. Negative results do not preclude SARS-CoV-2 infection, do not rule out co-infections with other pathogens, and should not be used as the sole basis for  treatment or other patient management decisions. Negative results must be combined with clinical observations, patient history, and epidemiological information. The expected result is Negative. Fact Sheet for Patients: HairSlick.no Fact Sheet for Healthcare Providers: quierodirigir.com This test is not yet approved or cleared by the [place] FDA and  has been authorized for detection and/or diagnosis of SARS-CoV-2 by FDA under an Emergency Use Authorization (EUA). This EUA will remain  in effect (meaning this test can be used) for the duration of the COVID-19 declaration under Section 56 4(b)(1) of the Act, 21 U.S.C. section 360bbb-3(b)(1), unless the authorization is terminated or revoked sooner. Performed at [facility], [address] [place], [place] [address]   Respiratory Panel by RT PCR (Flu A&B, Covid) - Nasopharyngeal Swab     Status: None   Collection Time: 04/06/20  6:37 PM   Specimen: Nasopharyngeal Swab  Result Value Ref Range Status   SARS Coronavirus 2 by RT PCR NEGATIVE NEGATIVE Final    Comment: (NOTE) SARS-CoV-2 target nucleic acids are NOT DETECTED. The SARS-CoV-2 RNA is generally detectable in upper respiratoy specimens during the acute phase of infection. The lowest concentration of SARS-CoV-2 viral copies this assay can detect is 131 copies/mL. A negative result does not preclude SARS-Cov-2 infection and should not be used as the sole basis for treatment or other patient management decisions. A negative result may occur with  improper specimen collection/handling, submission of specimen other than nasopharyngeal swab, presence of viral mutation(s) within the areas targeted by this assay, and inadequate number of viral copies (<131 copies/mL). A negative result must be combined with clinical observations, patient history, and epidemiological information. The expected result is Negative. Fact  Sheet for Patients:  https://www.moore.com/ Fact Sheet for Healthcare Providers:  https://www.young.biz/ This test is not yet ap proved or cleared by the [place] FDA and  has been authorized for detection and/or diagnosis of SARS-CoV-2 by FDA under an Emergency Use Authorization (EUA). This EUA will remain  in effect (meaning this test can be used) for the duration of the COVID-19 declaration under Section 564(b)(1) of the Act, 21 U.S.C. section 360bbb-3(b)(1), unless the authorization is terminated or revoked sooner.    Influenza A by PCR NEGATIVE NEGATIVE Final   Influenza B by PCR NEGATIVE NEGATIVE Final    Comment: (NOTE) The Xpert Xpress SARS-CoV-2/FLU/RSV assay is intended as an aid in  the diagnosis of influenza from  Nasopharyngeal swab specimens and  should not be used as a sole basis for treatment. Nasal washings and  aspirates are unacceptable for Xpert Xpress SARS-CoV-2/FLU/RSV  testing. Fact Sheet for Patients: https://www.moore.com/ Fact Sheet for Healthcare Providers: https://www.young.biz/ This test is not yet approved or cleared by the [place] FDA and  has been authorized for detection and/or diagnosis of SARS-CoV-2 by  FDA under an Emergency Use Authorization (EUA). This EUA will remain  in effect (meaning this test can be used) for the duration of the  Covid-19 declaration under Section 564(b)(1) of the Act, 21  U.S.C. section 360bbb-3(b)(1), unless the authorization is  terminated or revoked. Performed at [facility], [address], [place], [place] [address]   MRSA PCR Screening     Status: None   Collection Time: 04/07/20  4:02 PM   Specimen: Nasal Mucosa; Nasopharyngeal  Result Value Ref Range Status   MRSA by PCR NEGATIVE NEGATIVE Final    Comment:        The GeneXpert MRSA Assay (FDA approved for NASAL specimens only), is one component of  a comprehensive MRSA colonization surveillance program. It is not intended to diagnose MRSA infection nor to guide or monitor treatment for MRSA infections. Performed at [facility], [address], [place], [place] [address]          Radiology Studies: CT HEAD WO CONTRAST  Result Date: 04/07/2020 CLINICAL DATA:  Encephalopathy.  Decreased responsiveness. EXAM: CT HEAD WITHOUT CONTRAST TECHNIQUE: Contiguous axial images were obtained from the base of the skull through the vertex without intravenous contrast. COMPARISON:  CT head without contrast 08/14/2018 FINDINGS: Brain: Moderate atrophy and diffuse white matter disease is present. No acute infarct, hemorrhage, or mass lesion is present. The ventricles are proportionate to the degree of atrophy. Basal ganglia are intact bilaterally. Cortical ribbon is normal. No significant extra-axial fluid collection is present. Vascular: Dense atherosclerotic calcifications are present bilaterally. No hyperdense vessel is present. Skull: Calvarium is intact. No focal lytic or blastic lesions are present. No significant extracranial soft tissue lesion is present. Sinuses/Orbits: The paranasal sinuses and mastoid air cells are clear. The globes and orbits are within normal limits. Other: Gas locules within the right temporomandibular fossa may represent a small communication from the oral cavity. The oropharynx is distended with what appears to be positive airway pressure. IMPRESSION: 1. No acute intracranial abnormality or significant interval change. 2. Stable atrophy and white matter disease. 3. Atherosclerosis. 4. Gas locules in the right temporomandibular fossa are unlikely to be of consequence to the patient. This may represent a small communication from the oropharynx or be related to IV placement. Electronically Signed   By: Marin Roberts M.D.   On: 04/07/2020 05:52   US RENAL  Result Date: 04/07/2020 CLINICAL DATA:  Acute  kidney injury. EXAM: RENAL / URINARY TRACT ULTRASOUND COMPLETE COMPARISON:  CT scan of the abdomen dated 01/25/2020 FINDINGS: Right Kidney: Renal measurements: 8.9 x 4.7 x 4.9 cm = volume: 107 mL . Echogenicity within normal limits. No solid mass or hydronephrosis visualized. 1.4 cm cyst in upper pole. Left Kidney: Renal measurements: 10.3 x 5.1 x 4.5 cm. = volume: 125 mL. Echogenicity within normal limits. No mass or hydronephrosis visualized. Bladder: Multiple bladder diverticula. Otherwise normal. Ureteral jets are identified. Prevoid volume is 265 cc. Other: None. IMPRESSION: No significant abnormality of the kidneys.  Bladder diverticula. Electronically Signed   By: Francene Boyers M.D.   On: 04/07/2020 07:59  DG Chest Port 1 View  Result Date: 04/06/2020 CLINICAL DATA:  Weakness. EXAM: PORTABLE CHEST 1 VIEW COMPARISON:  02/15/2020 FINDINGS: Enlarged cardiac silhouette with an interval increase in size. Tortuous aorta. Minimal bibasilar linear atelectasis or scarring. Stable proximal right humeral calcified enchondroma or old infarct. IMPRESSION: No acute abnormality. Electronically Signed   By: Beckie Salts M.D.   On: 04/06/2020 18:42        Scheduled Meds: . atorvastatin  40 mg Oral Daily  . Chlorhexidine Gluconate Cloth  6 each Topical Daily  . clopidogrel  75 mg Oral Daily   Continuous Infusions: . sodium chloride 125 mL/hr at 04/08/20 0523  . heparin 850 Units/hr (04/08/20 0523)     LOS: 1 day    Time spent: 35 mins.More than 50% of that time was spent in counseling and/or coordination of care.      Burnadette Pop, MD [facility] P5/07/2020, 7:24 AM

## 2020-04-08 NOTE — Progress Notes (Signed)
Pt's son, Baneza Bartoszek stated that the 2 visitors for patient would be himself and Melchor Amour, a cousin. Pt's sister, Roanna Raider came to visit patient and was allowed up even though her name was not on the list due to no staffing at front entrance. Pt stated she wanted her son and sister to be visitors and not Information systems manager. This RN changed the visitor list and tried to call son Tammy Sours to give him the update but both phone numbers ring busy.

## 2020-04-09 ENCOUNTER — Inpatient Hospital Stay (HOSPITAL_COMMUNITY): Payer: Medicare Other

## 2020-04-09 LAB — CBC WITH DIFFERENTIAL/PLATELET
Abs Immature Granulocytes: 0.09 10*3/uL — ABNORMAL HIGH (ref 0.00–0.07)
Basophils Absolute: 0 10*3/uL (ref 0.0–0.1)
Basophils Relative: 0 %
Eosinophils Absolute: 0.1 10*3/uL (ref 0.0–0.5)
Eosinophils Relative: 0 %
HCT: 30 % — ABNORMAL LOW (ref 36.0–46.0)
Hemoglobin: 9.3 g/dL — ABNORMAL LOW (ref 12.0–15.0)
Immature Granulocytes: 1 %
Lymphocytes Relative: 6 %
Lymphs Abs: 0.8 10*3/uL (ref 0.7–4.0)
MCH: 27.8 pg (ref 26.0–34.0)
MCHC: 31 g/dL (ref 30.0–36.0)
MCV: 89.6 fL (ref 80.0–100.0)
Monocytes Absolute: 0.9 10*3/uL (ref 0.1–1.0)
Monocytes Relative: 7 %
Neutro Abs: 10.9 10*3/uL — ABNORMAL HIGH (ref 1.7–7.7)
Neutrophils Relative %: 86 %
Platelets: 223 10*3/uL (ref 150–400)
RBC: 3.35 MIL/uL — ABNORMAL LOW (ref 3.87–5.11)
RDW: 14.3 % (ref 11.5–15.5)
WBC: 12.8 10*3/uL — ABNORMAL HIGH (ref 4.0–10.5)
nRBC: 0 % (ref 0.0–0.2)

## 2020-04-09 LAB — COMPREHENSIVE METABOLIC PANEL
ALT: 19 U/L (ref 0–44)
AST: 21 U/L (ref 15–41)
Albumin: 2.8 g/dL — ABNORMAL LOW (ref 3.5–5.0)
Alkaline Phosphatase: 85 U/L (ref 38–126)
Anion gap: 11 (ref 5–15)
BUN: 32 mg/dL — ABNORMAL HIGH (ref 8–23)
CO2: 19 mmol/L — ABNORMAL LOW (ref 22–32)
Calcium: 8.2 mg/dL — ABNORMAL LOW (ref 8.9–10.3)
Chloride: 110 mmol/L (ref 98–111)
Creatinine, Ser: 0.92 mg/dL (ref 0.44–1.00)
GFR calc Af Amer: >60 mL/min (ref >60)
GFR calc non Af Amer: 56 mL/min — ABNORMAL LOW (ref >60)
Glucose, Bld: 114 mg/dL — ABNORMAL HIGH (ref 70–99)
Potassium: 3.6 mmol/L (ref 3.5–5.1)
Sodium: 140 mmol/L (ref 135–145)
Total Bilirubin: 1.1 mg/dL (ref 0.3–1.2)
Total Protein: 6.3 g/dL — ABNORMAL LOW (ref 6.5–8.1)

## 2020-04-09 LAB — HEPARIN LEVEL (UNFRACTIONATED): Heparin Unfractionated: 0.98 IU/mL — ABNORMAL HIGH (ref 0.30–0.70)

## 2020-04-09 LAB — APTT: aPTT: 89 seconds — ABNORMAL HIGH (ref 24–36)

## 2020-04-09 MED ORDER — POLYETHYLENE GLYCOL 3350 17 G PO PACK
17.0000 g | PACK | Freq: Two times a day (BID) | ORAL | Status: DC
  Administered 2020-04-09 – 2020-04-14 (×7): 17 g via ORAL
  Filled 2020-04-09 (×12): qty 1

## 2020-04-09 MED ORDER — IBUPROFEN 200 MG PO TABS
600.0000 mg | ORAL_TABLET | Freq: Once | ORAL | Status: AC
  Administered 2020-04-09: 600 mg via ORAL
  Filled 2020-04-09: qty 3

## 2020-04-09 MED ORDER — FUROSEMIDE 40 MG PO TABS
40.0000 mg | ORAL_TABLET | ORAL | Status: DC
  Administered 2020-04-09 – 2020-04-11 (×2): 40 mg via ORAL
  Filled 2020-04-09 (×2): qty 1

## 2020-04-09 MED ORDER — APIXABAN 5 MG PO TABS
5.0000 mg | ORAL_TABLET | Freq: Two times a day (BID) | ORAL | Status: DC
  Administered 2020-04-09 – 2020-04-11 (×6): 5 mg via ORAL
  Filled 2020-04-09 (×6): qty 1

## 2020-04-09 MED ORDER — IRBESARTAN 300 MG PO TABS
300.0000 mg | ORAL_TABLET | Freq: Every day | ORAL | Status: DC
  Administered 2020-04-09 – 2020-04-17 (×9): 300 mg via ORAL
  Filled 2020-04-09 (×9): qty 1

## 2020-04-09 NOTE — Progress Notes (Signed)
PROGRESS NOTE    Cheryl Atkinson  MEQ:[id number] DOB: November 21, 1931 DOA: 04/06/2020 PCP: Shelva Majestic, MD   Brief Narrative: Patient is a 86 year old female with history of persistent A. fib, chronic diastolic CHF, COPD, GERD, hypertension, hyperlipidemia, peripheral vascular disease who was recently admitted here for cholecystitis and underwent laparoscopic cholecystectomy.  She was discharged from here on 04/05/2020 and prescribed tramadol for pain.  Patient was brought back to the emerge department by family because of concern of lethargy, confusion.  He saturated 87% on room air, had to be placed on supplemental oxygen and eventually on BiPAP due to decreased mentation, somnolence.  ABG showed hypercarbia.  She was also febrile on presentation with leukocytosis.  Found to have AKI.  Chest x-ray did not show any acute abnormality.  Also appeared very dehydrated on presentation. She had to be started on Narcan as needed for opioid toxicity. Currently she is on room air.General surgery following.  She remains weak and is waiting for PT assessment.    Assessment & Plan:   Principal Problem:   Acute respiratory failure (HCC) Active Problems:   COPD (chronic obstructive pulmonary disease) (HCC)   S/P laparoscopic cholecystectomy   Encephalopathy   AKI (acute kidney injury) (HCC)   Acute respiratory failure with hypoxia (HCC)   Acute hypoxic hypercarbic respiratory failure: Most likely secondary to respiratory depression secondary to tramadol overuse.  She was just discharged from here after cholecystectomy and was prescribed tramadol for pain.  Chest x-ray does not show any acute findings.  She was hypoxic on room air and had to be put on BiPAP.  ABG showed hypercarbia with CO2 of 68.  Mentation is improved after receiving has been placed on BiPAP and given Narcan.  Hold any opiates or sedating medications.    CT head did not show any acute intracranial abnormalities. Mental and respiratory  status improved after Narcan doses.  Currently she is maintaining her saturation on room air.  Off BiPAP. This morning she was tachypneic.  Chest x-ray has been ordered.  Result pending.  AKI: Resolved with IV fluids.  Renal ultrasound did not show any acute findings.  Urine sodium is 24.Foley was placed on admission which has been discontinued.  Leukocytosis: Improving.Unclear etiology.  No signs of infection.No indication for antibiotic therapy for now.Will check CXR  Acute cholecystitis status post laparoscopic cholecystectomy on 04/04/2020: Presented with significant leukocytosis, fever of 100 F.  Noted hypotension on arrival, lactic cidosis.  General surgery following.  No abdominal pain.  General surgery did not suspect any acute intra-abdominal findings.  Now hemodynamically stable. General surgery ordered CT abdomen/pelvis to rule out if there is any complication of recent surgery.No acute findings.  Hyperkalemia: Resolved  Permanent A. fib: Currently rate is controlled.  On Eliquis for anticoagulation.  Not on any rate control agents.  She was started  on heparin drip as Eliquis was  held because of deranged renal function.  Eliquis resumed.  Peripheral vascular disease: On Plavix, Lipitor  COPD: Currently stable.  No signs of acute exacerbation.  No wheezing.  Continue bronchodilators as needed.  Hypertension: Severely Hypertensive today.  Continue as needed meds.  On ARB at home which is which has been resumed.  Continue to monitor.           DVT prophylaxis: Eliquis Code Status: DNR Family Communication: Called and discussed with  Son at bedside today Status is: inaptient   Dispo: The patient is from: Home  Anticipated d/c is to: Home              Anticipated d/c date is:1 day              Patient currently is not medically stable to d/c.  She is severely hypertensive today.She also needs PT.  Consultants: Surgery  Procedures:None  Antimicrobials:    Anti-infectives (From admission, onward)   None      Subjective:  Patient seen and examined at the bedside this morning.  Very hypertensive.  Son was at the bedside.  She was slightly tachypneic today but her lungs are clear and she denies any shortness of breath.  Maintaining her saturation on room air.  Remains weak    Objective: Vitals:   04/09/20 0815 04/09/20 0847 04/09/20 0900 04/09/20 1000  BP:   (!) 189/98 (!) 184/98  Pulse:   88 80  Resp:   (!) 24 (!) 29  Temp: 98.2 F (36.8 C)     TempSrc: Oral     SpO2:  99% 98% 99%  Weight:      Height:        Intake/Output Summary (Last 24 hours) at 04/09/2020 1104 Last data filed at 04/09/2020 1000 Gross per 24 hour  Intake 2844.43 ml  Output 1075 ml  Net 1769.43 ml   Filed Weights   04/07/20 1621  Weight: 64.5 kg    Examination:   General exam: Generalized weakness, debilitated elderly female Respiratory system: Bilateral equal air entry, normal vesicular breath sounds, no wheezes or crackles , tachypnea Cardiovascular system: S1 & S2 heard, RRR. No JVD, murmurs, rubs, gallops or clicks. Gastrointestinal system: Abdomen is nondistended, soft and nontender. No organomegaly or masses felt. Normal bowel sounds heard. Central nervous system: Alert and oriented. No focal neurological deficits. Extremities: No edema, no clubbing ,no cyanosis, distal peripheral pulses palpable. Skin: No rashes, lesions or ulcers,no icterus ,no pallor   Data Reviewed: I have personally reviewed following labs and imaging studies  CBC: Recent Labs  Lab 04/05/20 0249 04/06/20 1831 04/07/20 0500 04/08/20 0520 04/09/20 0316  WBC 11.2* 20.4* 15.5* 12.9* 12.8*  NEUTROABS  --  17.5*  --  11.0* 10.9*  HGB 10.0* 10.7* 9.0* 9.3* 9.3*  HCT 32.0* 35.5* 30.3* 30.3* 30.0*  MCV 88.6 92.2 93.5 92.7 89.6  PLT 241 223 194 206 223   Basic Metabolic Panel: Recent Labs  Lab 04/04/20 0847 04/06/20 1831 04/07/20 0500 04/08/20 0520 04/09/20 0316   NA 139 137 136 140 140  K 4.2 5.5* 5.0 4.5 3.6  CL 108 102 103 109 110  CO2 22 23 24  20* 19*  GLUCOSE 100* 119* 97 84 114*  BUN 27* 44* 50* 53* 32*  CREATININE 0.76 2.32* 2.38* 1.66* 0.92  CALCIUM 9.2 8.8* 8.3* 8.1* 8.2*   GFR: Estimated Creatinine Clearance: 41.1 mL/min (by C-G formula based on SCr of 0.92 mg/dL). Liver Function Tests: Recent Labs  Lab 04/04/20 0847 04/06/20 1831 04/09/20 0316  AST 16 27 21   ALT 14 20 19   ALKPHOS 87 93 85  BILITOT 0.5 0.9 1.1  PROT 7.4 7.1 6.3*  ALBUMIN 3.8 3.4* 2.8*   Recent Labs  Lab 04/06/20 1831  LIPASE 17   No results for input(s): AMMONIA in the last 168 hours. Coagulation Profile: Recent Labs  Lab 04/04/20 0847 04/07/20 1300  INR 1.1 1.3*   Cardiac Enzymes: No results for input(s): CKTOTAL, CKMB, CKMBINDEX, TROPONINI in the last 168 hours. BNP (last 3 results) Recent Labs  02/11/20 0936  PROBNP 228.0*   HbA1C: No results for input(s): HGBA1C in the last 72 hours. CBG: Recent Labs  Lab 04/08/20 1939  GLUCAP 108*   Lipid Profile: No results for input(s): CHOL, HDL, LDLCALC, TRIG, CHOLHDL, LDLDIRECT in the last 72 hours. Thyroid Function Tests: No results for input(s): TSH, T4TOTAL, FREET4, T3FREE, THYROIDAB in the last 72 hours. Anemia Panel: No results for input(s): VITAMINB12, FOLATE, FERRITIN, TIBC, IRON, RETICCTPCT in the last 72 hours. Sepsis Labs: Recent Labs  Lab 04/06/20 2006  LATICACIDVEN 1.3    Recent Results (from the past 240 hour(s))  SARS CORONAVIRUS 2 (TAT 6-24 HRS) Nasopharyngeal Nasopharyngeal Swab     Status: None   Collection Time: 03/31/20 12:54 PM   Specimen: Nasopharyngeal Swab  Result Value Ref Range Status   SARS Coronavirus 2 NEGATIVE NEGATIVE Final    Comment: (NOTE) SARS-CoV-2 target nucleic acids are NOT DETECTED. The SARS-CoV-2 RNA is generally detectable in upper and lower respiratory specimens during the acute phase of infection. Negative results do not preclude  SARS-CoV-2 infection, do not rule out co-infections with other pathogens, and should not be used as the sole basis for treatment or other patient management decisions. Negative results must be combined with clinical observations, patient history, and epidemiological information. The expected result is Negative. Fact Sheet for Patients: SugarRoll.be Fact Sheet for Healthcare Providers: https://www.woods-mathews.com/ This test is not yet approved or cleared by the [place] FDA and  has been authorized for detection and/or diagnosis of SARS-CoV-2 by FDA under an Emergency Use Authorization (EUA). This EUA will remain  in effect (meaning this test can be used) for the duration of the COVID-19 declaration under Section 56 4(b)(1) of the Act, 21 U.S.C. section 360bbb-3(b)(1), unless the authorization is terminated or revoked sooner. Performed at [facility], [place] [address] [place], [place] [address]   Respiratory Panel by RT PCR (Flu A&B, Covid) - Nasopharyngeal Swab     Status: None   Collection Time: 04/06/20  6:37 PM   Specimen: Nasopharyngeal Swab  Result Value Ref Range Status   SARS Coronavirus 2 by RT PCR NEGATIVE NEGATIVE Final    Comment: (NOTE) SARS-CoV-2 target nucleic acids are NOT DETECTED. The SARS-CoV-2 RNA is generally detectable in upper respiratoy specimens during the acute phase of infection. The lowest concentration of SARS-CoV-2 viral copies this assay can detect is 131 copies/mL. A negative result does not preclude SARS-Cov-2 infection and should not be used as the sole basis for treatment or other patient management decisions. A negative result may occur with  improper specimen collection/handling, submission of specimen other than nasopharyngeal swab, presence of viral mutation(s) within the areas targeted by this assay, and inadequate number of viral copies (<131 copies/mL). A negative result must be combined  with clinical observations, patient history, and epidemiological information. The expected result is Negative. Fact Sheet for Patients:  PinkCheek.be Fact Sheet for Healthcare Providers:  GravelBags.it This test is not yet ap proved or cleared by the [place] FDA and  has been authorized for detection and/or diagnosis of SARS-CoV-2 by FDA under an Emergency Use Authorization (EUA). This EUA will remain  in effect (meaning this test can be used) for the duration of the COVID-19 declaration under Section 564(b)(1) of the Act, 21 U.S.C. section 360bbb-3(b)(1), unless the authorization is terminated or revoked sooner.    Influenza A by PCR NEGATIVE NEGATIVE Final   Influenza B by PCR NEGATIVE NEGATIVE Final    Comment: (NOTE) The Xpert  Xpress SARS-CoV-2/FLU/RSV assay is intended as an aid in  the diagnosis of influenza from Nasopharyngeal swab specimens and  should not be used as a sole basis for treatment. Nasal washings and  aspirates are unacceptable for Xpert Xpress SARS-CoV-2/FLU/RSV  testing. Fact Sheet for Patients: https://www.moore.com/https://www.fda.gov/media/142436/download Fact Sheet for Healthcare Providers: https://www.young.biz/https://www.fda.gov/media/142435/download This test is not yet approved or cleared by the [place] FDA and  has been authorized for detection and/or diagnosis of SARS-CoV-2 by  FDA under an Emergency Use Authorization (EUA). This EUA will remain  in effect (meaning this test can be used) for the duration of the  Covid-19 declaration under Section 564(b)(1) of the Act, 21  U.S.C. section 360bbb-3(b)(1), unless the authorization is  terminated or revoked. Performed at [facility], [address] [place], [place] [address]   MRSA PCR Screening     Status: None   Collection Time: 04/07/20  4:02 PM   Specimen: Nasal Mucosa; Nasopharyngeal  Result Value Ref Range Status   MRSA by PCR NEGATIVE NEGATIVE  Final    Comment:        The GeneXpert MRSA Assay (FDA approved for NASAL specimens only), is one component of a comprehensive MRSA colonization surveillance program. It is not intended to diagnose MRSA infection nor to guide or monitor treatment for MRSA infections. Performed at [facility], [address] [place], [place] [address]          Radiology Studies: CT ABDOMEN PELVIS WO CONTRAST  Result Date: 04/08/2020 CLINICAL DATA:  Postop abdominal pain and fever. Recent cholecystectomy. EXAM: CT ABDOMEN AND PELVIS WITHOUT CONTRAST TECHNIQUE: Multidetector CT imaging of the abdomen and pelvis was performed following the standard protocol without IV contrast. COMPARISON:  01/25/2020 FINDINGS: Lower chest: New small right pleural effusion. Dependent atelectasis/consolidation posteriorly in the right lower lobe. Coronary calcifications. Hepatobiliary: Surgical clips from cholecystectomy. Low attenuation material in the gallbladder fossa with scattered gas attenuation. No focal liver lesion. The CBD is prominent measuring up to 1.7 cm diameter, down to the ampulla level. Mild central intrahepatic biliary ductal dilatation. Pancreas: Mild diffuse atrophy without mass or ductal dilatation. Spleen: Normal in size without focal abnormality. Adrenals/Urinary Tract: Stable 1.9 cm left adrenal nodule. Kidneys unremarkable. Urinary bladder decompressed by Foley catheter. Stomach/Bowel: Stomach is decompressed. Small bowel is nondilated. Appendix surgically absent. Gas and moderate fecal material in the proximal, decompressed distally. Vascular/Lymphatic: Heavy aortoiliac arterial calcifications. Infrarenal aorta 2.7 cm diameter. Metallic stent in the right common iliac artery. No definite abdominal or pelvic adenopathy. Reproductive: Status post hysterectomy. No adnexal masses. Other: Small volume perihepatic ascites. No free air. Scattered gas bubbles in the abdominal body wall  presumably related to recent surgery. Musculoskeletal: Degenerative disc disease L5-S1. Old healed sternal fracture. No acute fracture or worrisome bone lesion. IMPRESSION: 1. Small volume perihepatic ascites post cholecystectomy. 2. Mild central intrahepatic biliary duct dilatation and distended CBD. Correlate with any clinical or laboratory evidence of biliary obstruction. 3. New small right pleural effusion with dependent atelectasis/consolidation in the right lower lobe. 4. Low-attenuation material in the gallbladder fossa with scattered gas attenuation, possibly related to Surgicel from cholecystectomy. 5. Stable left adrenal nodule. Aortic Atherosclerosis (ICD10-I70.0). Electronically Signed   By: Corlis Leak  Hassell M.D.   On: 04/08/2020 12:24        Scheduled Meds: . apixaban  5 mg Oral BID  . atorvastatin  40 mg Oral Daily  . Chlorhexidine Gluconate Cloth  6 each Topical Daily  . clopidogrel  75  mg Oral Daily  . furosemide  40 mg Oral QODAY  . irbesartan  300 mg Oral Daily  . polyethylene glycol  17 g Oral BID   Continuous Infusions:    LOS: 2 days    Time spent: 35 mins.More than 50% of that time was spent in counseling and/or coordination of care.      Burnadette Pop, MD [facility] P5/08/2020, 11:04 AM

## 2020-04-09 NOTE — Progress Notes (Signed)
ANTICOAGULATION CONSULT NOTE - Follow Up Consult  Pharmacy Consult for Heparin Indication: Atrial fibrillation. Apixaban on hold  Allergies  Allergen Reactions  . Codeine Other (See Comments)    REACTION: Syncope     Patient Measurements: Height: 5\' 6"  (167.6 cm) Weight: 64.5 kg (142 lb 3.2 oz) IBW/kg (Calculated) : 59.3 Heparin Dosing Weight:   Vital Signs: Temp: 99.2 F (37.3 C) (05/09 0400) Temp Source: Axillary (05/09 0400) BP: 179/72 (05/09 0400) Pulse Rate: 64 (05/09 0400)  Labs: Recent Labs     0000 04/06/20 1831 04/06/20 1831 04/07/20 0500 04/07/20 1300 04/07/20 2301 04/08/20 0520 04/08/20 1701 04/09/20 0316  HGB   < > 10.7*   < > 9.0*  --   --  9.3*  --  9.3*  HCT  --  35.5*   < > 30.3*  --   --  30.3*  --  30.0*  PLT  --  223   < > 194  --   --  206  --  223  APTT  --   --   --   --  41*   < > 54* 65* 89*  LABPROT  --   --   --   --  16.1*  --   --   --   --   INR  --   --   --   --  1.3*  --   --   --   --   HEPARINUNFRC  --   --   --   --  1.76*  --  1.32*  --  0.98*  CREATININE  --  2.32*  --  2.38*  --   --  1.66*  --   --    < > = values in this interval not displayed.    Estimated Creatinine Clearance: 22.8 mL/min (A) (by C-G formula based on SCr of 1.66 mg/dL (H)).   Medications:  Infusions:  . sodium chloride 100 mL/hr at 04/08/20 1900  . heparin 1,100 Units/hr (04/08/20 1900)    Assessment: Patient with PTT at goal.  Heparin level above goal.  PTT ordered with Heparin level until both correlate due to possible drug-lab interaction between oral anticoagulant (rivaroxaban, edoxaban, or apixaban) and anti-Xa level (aka heparin level) No heparin issue noted.  Goal of Therapy:  Heparin level 0.3-0.7 units/ml aPTT 66-102 seconds Monitor platelets by anticoagulation protocol: Yes   Plan:  Continue heparin drip at current rate Recheck level at 1200  06/08/20, [place] 04/09/2020,4:37 AM

## 2020-04-09 NOTE — Progress Notes (Signed)
Subjective/Chief Complaint: Wants to go home but breathing seems more labored this am. CT shows no obvious complication of recent lap chole   Objective: Vital signs in last 24 hours: Temp:  [97 F (36.1 C)-99.2 F (37.3 C)] 99.2 F (37.3 C) (05/09 0400) Pulse Rate:  [64-105] 97 (05/09 0600) Resp:  [13-30] 26 (05/09 0600) BP: (157-200)/(57-98) 200/95 (05/09 0600) SpO2:  [93 %-100 %] 98 % (05/09 0600) Last BM Date: 04/04/20  Intake/Output from previous day: 05/08 0701 - 05/09 0700 In: 2207.9 [P.O.:620; I.V.:1587.9] Out: 1375 [Urine:1375] Intake/Output this shift: No intake/output data recorded.  General appearance: alert and cooperative Resp: clear to auscultation bilaterally and with increased effort Cardio: regular rate and rhythm GI: soft, mild RUQ tenderness  Lab Results:  Recent Labs    04/08/20 0520 04/09/20 0316  WBC 12.9* 12.8*  HGB 9.3* 9.3*  HCT 30.3* 30.0*  PLT 206 223   BMET Recent Labs    04/08/20 0520 04/09/20 0316  NA 140 140  K 4.5 3.6  CL 109 110  CO2 20* 19*  GLUCOSE 84 114*  BUN 53* 32*  CREATININE 1.66* 0.92  CALCIUM 8.1* 8.2*   PT/INR Recent Labs    04/07/20 1300  LABPROT 16.1*  INR 1.3*   ABG Recent Labs    04/07/20 0102 04/07/20 0555  PHART 7.267* 7.255*  HCO3 22.5 22.7    Studies/Results: CT ABDOMEN PELVIS WO CONTRAST  Result Date: 04/08/2020 CLINICAL DATA:  Postop abdominal pain and fever. Recent cholecystectomy. EXAM: CT ABDOMEN AND PELVIS WITHOUT CONTRAST TECHNIQUE: Multidetector CT imaging of the abdomen and pelvis was performed following the standard protocol without IV contrast. COMPARISON:  01/25/2020 FINDINGS: Lower chest: New small right pleural effusion. Dependent atelectasis/consolidation posteriorly in the right lower lobe. Coronary calcifications. Hepatobiliary: Surgical clips from cholecystectomy. Low attenuation material in the gallbladder fossa with scattered gas attenuation. No focal liver lesion. The  CBD is prominent measuring up to 1.7 cm diameter, down to the ampulla level. Mild central intrahepatic biliary ductal dilatation. Pancreas: Mild diffuse atrophy without mass or ductal dilatation. Spleen: Normal in size without focal abnormality. Adrenals/Urinary Tract: Stable 1.9 cm left adrenal nodule. Kidneys unremarkable. Urinary bladder decompressed by Foley catheter. Stomach/Bowel: Stomach is decompressed. Small bowel is nondilated. Appendix surgically absent. Gas and moderate fecal material in the proximal, decompressed distally. Vascular/Lymphatic: Heavy aortoiliac arterial calcifications. Infrarenal aorta 2.7 cm diameter. Metallic stent in the right common iliac artery. No definite abdominal or pelvic adenopathy. Reproductive: Status post hysterectomy. No adnexal masses. Other: Small volume perihepatic ascites. No free air. Scattered gas bubbles in the abdominal body wall presumably related to recent surgery. Musculoskeletal: Degenerative disc disease L5-S1. Old healed sternal fracture. No acute fracture or worrisome bone lesion. IMPRESSION: 1. Small volume perihepatic ascites post cholecystectomy. 2. Mild central intrahepatic biliary duct dilatation and distended CBD. Correlate with any clinical or laboratory evidence of biliary obstruction. 3. New small right pleural effusion with dependent atelectasis/consolidation in the right lower lobe. 4. Low-attenuation material in the gallbladder fossa with scattered gas attenuation, possibly related to Surgicel from cholecystectomy. 5. Stable left adrenal nodule. Aortic Atherosclerosis (ICD10-I70.0). Electronically Signed   By: Corlis Leak M.D.   On: 04/08/2020 12:24   US RENAL  Result Date: 04/07/2020 CLINICAL DATA:  Acute kidney injury. EXAM: RENAL / URINARY TRACT ULTRASOUND COMPLETE COMPARISON:  CT scan of the abdomen dated 01/25/2020 FINDINGS: Right Kidney: Renal measurements: 8.9 x 4.7 x 4.9 cm = volume: 107 mL . Echogenicity within normal limits. No  solid  mass or hydronephrosis visualized. 1.4 cm cyst in upper pole. Left Kidney: Renal measurements: 10.3 x 5.1 x 4.5 cm. = volume: 125 mL. Echogenicity within normal limits. No mass or hydronephrosis visualized. Bladder: Multiple bladder diverticula. Otherwise normal. Ureteral jets are identified. Prevoid volume is 265 cc. Other: None. IMPRESSION: No significant abnormality of the kidneys.  Bladder diverticula. Electronically Signed   By: Lorriane Shire M.D.   On: 04/07/2020 07:59    Anti-infectives: Anti-infectives (From admission, onward)   None      Assessment/Plan: s/p * No surgery found * s/p lap chole-stable  Respiratory compromise per medicine No new recs  LOS: 2 days    Cheryl Atkinson 04/09/2020

## 2020-04-09 NOTE — Evaluation (Signed)
Physical Therapy Evaluation Patient Details Name: Cheryl Atkinson MRN: [id number] DOB: [birth year] Today's Date: 04/09/2020   History of Present Illness  Patient is an 86 year old female with a history of atrial fibrillation, hypertension, COPD, CHF, prediabetes and recent Lap chole.  Pt admitted through ED with acute respiratory failure likely 2* Tramadol overuse.  Clinical Impression  Pt admitted as above and presenting with functional mobility limitations 2* generalized weakness, significant balance deficits, and poor endurance.  Pt would benefit from follow up rehab at SNF level to maximize IND and safety prior to return home.    Follow Up Recommendations SNF    Equipment Recommendations  None recommended by PT    Recommendations for Other Services OT consult     Precautions / Restrictions Precautions Precautions: Fall Restrictions Weight Bearing Restrictions: No      Mobility  Bed Mobility Overal bed mobility: Needs Assistance Bed Mobility: Rolling;Sidelying to Sit;Sit to Sidelying Rolling: Mod assist Sidelying to sit: Mod assist;+2 for physical assistance;+2 for safety/equipment     Sit to sidelying: Mod assist;+2 for physical assistance;+2 for safety/equipment General bed mobility comments: Increased time with cues for sequence and assist to complete roll, manage LEs on/off bed and control trunk  Transfers Overall transfer level: Needs assistance Equipment used: Rolling walker (2 wheeled) Transfers: Sit to/from Stand Sit to Stand: Mod assist;+2 physical assistance;+2 safety/equipment;From elevated surface         General transfer comment: INcreased time with cues for safe transition position and use of UEs to self assist.  Physical assist to bring wt up and fwd and to balance in standing with RW  Ambulation/Gait             General Gait Details: Pt stood only but unable to bring wt fwd into BOS sufficiently to safely attempt to step  Stairs             Wheelchair Mobility    Modified Rankin (Stroke Patients Only)       Balance Overall balance assessment: Needs assistance Sitting-balance support: Bilateral upper extremity supported;Feet supported Sitting balance-Leahy Scale: Poor     Standing balance support: Bilateral upper extremity supported Standing balance-Leahy Scale: Zero Standing balance comment: posterior lean                             Pertinent Vitals/Pain Pain Assessment: No/denies pain    Home Living Family/patient expects to be discharged to:: Private residence Living Arrangements: Children Available Help at Discharge: Family;Available PRN/intermittently Type of Home: House Home Access: Stairs to enter   Entrance Stairs-Number of Steps: 1 Home Layout: One level Home Equipment: Walker - 4 wheels;Cane - single point;Walker - 2 wheels Additional Comments: Pt lives with son and pt sister reports has aide when son is at work    Prior Function Level of Independence: Independent with assistive device(s)   Gait / Transfers Assistance Needed: Pt states she uses rollator in house and onto driveway  ADL's / Homemaking Assistance Needed: Son and Merchant navy officer Dominance   Dominant Hand: Right    Extremity/Trunk Assessment   Upper Extremity Assessment Upper Extremity Assessment: Generalized weakness    Lower Extremity Assessment Lower Extremity Assessment: Generalized weakness;RLE deficits/detail;LLE deficits/detail RLE Deficits / Details: limited DF and hip extension LLE Deficits / Details: limited DF and hip ext    Cervical / Trunk Assessment Cervical / Trunk Assessment: Kyphotic  Communication   Communication: No  difficulties  Cognition Arousal/Alertness: Awake/alert Behavior During Therapy: WFL for tasks assessed/performed Overall Cognitive Status: Within Functional Limits for tasks assessed                                        General Comments       Exercises     Assessment/Plan    PT Assessment Patient needs continued PT services  PT Problem List Decreased strength;Decreased range of motion;Decreased activity tolerance;Decreased balance;Decreased mobility;Decreased knowledge of use of DME;Obesity       PT Treatment Interventions DME instruction;Gait training;Stair training;Functional mobility training;Therapeutic activities;Therapeutic exercise;Patient/family education    PT Goals (Current goals can be found in the Care Plan section)  Acute Rehab PT Goals Patient Stated Goal: Regain IND PT Goal Formulation: With patient Time For Goal Achievement: 04/23/20 Potential to Achieve Goals: Fair    Frequency Min 3X/week   Barriers to discharge Other (comment) Pt likely to refuse SNF 2* dissatisfaction with recent stay    Co-evaluation               AM-PAC PT "6 Clicks" Mobility  Outcome Measure Help needed turning from your back to your side while in a flat bed without using bedrails?: A Lot Help needed moving from lying on your back to sitting on the side of a flat bed without using bedrails?: A Lot Help needed moving to and from a bed to a chair (including a wheelchair)?: Total Help needed standing up from a chair using your arms (e.g., wheelchair or bedside chair)?: A Lot Help needed to walk in hospital room?: Total Help needed climbing 3-5 steps with a railing? : Total 6 Click Score: 9    End of Session Equipment Utilized During Treatment: Gait belt Activity Tolerance: Patient limited by fatigue Patient left: in bed;with call bell/phone within reach;with nursing/sitter in room;with bed alarm set Nurse Communication: Mobility status PT Visit Diagnosis: Muscle weakness (generalized) (M62.81);Difficulty in walking, not elsewhere classified (R26.2)    Time: 1350-1415 PT Time Calculation (min) (ACUTE ONLY): 25 min   Charges:   PT Evaluation $PT Eval Low Complexity: 1 Low PT Treatments $Therapeutic Activity:  8-22 mins        Debe Coder PT Acute Rehabilitation Services Pager [phone] Office [phone]   Jaiceon Collister 04/09/2020, 5:03 PM

## 2020-04-10 ENCOUNTER — Encounter (HOSPITAL_COMMUNITY): Payer: Self-pay | Admitting: Internal Medicine

## 2020-04-10 ENCOUNTER — Inpatient Hospital Stay (HOSPITAL_COMMUNITY): Payer: Medicare Other

## 2020-04-10 LAB — CBC WITH DIFFERENTIAL/PLATELET
Abs Immature Granulocytes: 0.1 10*3/uL — ABNORMAL HIGH (ref 0.00–0.07)
Basophils Absolute: 0 10*3/uL (ref 0.0–0.1)
Basophils Relative: 0 %
Eosinophils Absolute: 0.1 10*3/uL (ref 0.0–0.5)
Eosinophils Relative: 1 %
HCT: 26.4 % — ABNORMAL LOW (ref 36.0–46.0)
Hemoglobin: 8.4 g/dL — ABNORMAL LOW (ref 12.0–15.0)
Immature Granulocytes: 1 %
Lymphocytes Relative: 6 %
Lymphs Abs: 0.9 10*3/uL (ref 0.7–4.0)
MCH: 27.5 pg (ref 26.0–34.0)
MCHC: 31.8 g/dL (ref 30.0–36.0)
MCV: 86.3 fL (ref 80.0–100.0)
Monocytes Absolute: 1.1 10*3/uL — ABNORMAL HIGH (ref 0.1–1.0)
Monocytes Relative: 7 %
Neutro Abs: 12.4 10*3/uL — ABNORMAL HIGH (ref 1.7–7.7)
Neutrophils Relative %: 85 %
Platelets: 210 10*3/uL (ref 150–400)
RBC: 3.06 MIL/uL — ABNORMAL LOW (ref 3.87–5.11)
RDW: 14.4 % (ref 11.5–15.5)
WBC: 14.6 10*3/uL — ABNORMAL HIGH (ref 4.0–10.5)
nRBC: 0 % (ref 0.0–0.2)

## 2020-04-10 LAB — BASIC METABOLIC PANEL
Anion gap: 10 (ref 5–15)
BUN: 30 mg/dL — ABNORMAL HIGH (ref 8–23)
CO2: 21 mmol/L — ABNORMAL LOW (ref 22–32)
Calcium: 7.8 mg/dL — ABNORMAL LOW (ref 8.9–10.3)
Chloride: 110 mmol/L (ref 98–111)
Creatinine, Ser: 0.89 mg/dL (ref 0.44–1.00)
GFR calc Af Amer: >60 mL/min (ref >60)
GFR calc non Af Amer: 59 mL/min — ABNORMAL LOW (ref >60)
Glucose, Bld: 137 mg/dL — ABNORMAL HIGH (ref 70–99)
Potassium: 3.1 mmol/L — ABNORMAL LOW (ref 3.5–5.1)
Sodium: 141 mmol/L (ref 135–145)

## 2020-04-10 LAB — URINALYSIS, ROUTINE W REFLEX MICROSCOPIC
Bilirubin Urine: NEGATIVE
Glucose, UA: NEGATIVE mg/dL
Hgb urine dipstick: NEGATIVE
Ketones, ur: NEGATIVE mg/dL
Leukocytes,Ua: NEGATIVE
Nitrite: NEGATIVE
Protein, ur: NEGATIVE mg/dL
Specific Gravity, Urine: 1.006 (ref 1.005–1.030)
pH: 5 (ref 5.0–8.0)

## 2020-04-10 MED ORDER — FUROSEMIDE 10 MG/ML IJ SOLN: 20.0000 mg | Freq: Once | INTRAMUSCULAR | Status: DC

## 2020-04-10 MED ORDER — VANCOMYCIN HCL IN DEXTROSE 1-5 GM/200ML-% IV SOLN
1000.0000 mg | Freq: Once | INTRAVENOUS | Status: AC
  Administered 2020-04-10: 1000 mg via INTRAVENOUS
  Filled 2020-04-10: qty 200

## 2020-04-10 MED ORDER — IPRATROPIUM-ALBUTEROL 0.5-2.5 (3) MG/3ML IN SOLN
3.0000 mL | Freq: Four times a day (QID) | RESPIRATORY_TRACT | Status: DC
  Administered 2020-04-10 – 2020-04-11 (×4): 3 mL via RESPIRATORY_TRACT
  Filled 2020-04-10 (×4): qty 3

## 2020-04-10 MED ORDER — ENSURE ENLIVE PO LIQD
237.0000 mL | Freq: Two times a day (BID) | ORAL | Status: DC
  Administered 2020-04-10 – 2020-04-17 (×8): 237 mL via ORAL

## 2020-04-10 MED ORDER — BISACODYL 10 MG RE SUPP
10.0000 mg | Freq: Every day | RECTAL | Status: DC | PRN
  Administered 2020-04-10: 10 mg via RECTAL
  Filled 2020-04-10: qty 1

## 2020-04-10 MED ORDER — PIPERACILLIN-TAZOBACTAM 3.375 G IVPB 30 MIN
3.3750 g | Freq: Once | INTRAVENOUS | Status: AC
  Administered 2020-04-10: 3.375 g via INTRAVENOUS
  Filled 2020-04-10 (×2): qty 50

## 2020-04-10 MED ORDER — FUROSEMIDE 10 MG/ML IJ SOLN
40.0000 mg | Freq: Once | INTRAMUSCULAR | Status: AC
  Administered 2020-04-10: 40 mg via INTRAVENOUS
  Filled 2020-04-10: qty 4

## 2020-04-10 MED ORDER — POTASSIUM CHLORIDE CRYS ER 20 MEQ PO TBCR
40.0000 meq | EXTENDED_RELEASE_TABLET | ORAL | Status: AC
  Administered 2020-04-10 (×2): 40 meq via ORAL
  Filled 2020-04-10 (×2): qty 2

## 2020-04-10 MED ORDER — SODIUM CHLORIDE 0.9 % IV SOLN
2.0000 g | Freq: Two times a day (BID) | INTRAVENOUS | Status: DC
  Administered 2020-04-10 – 2020-04-11 (×3): 2 g via INTRAVENOUS
  Filled 2020-04-10 (×4): qty 2

## 2020-04-10 MED ORDER — SODIUM CHLORIDE 0.9 % IV SOLN
INTRAVENOUS | Status: DC | PRN
  Administered 2020-04-10 (×2): 250 mL via INTRAVENOUS

## 2020-04-10 MED ORDER — VANCOMYCIN HCL 1250 MG/250ML IV SOLN
1250.0000 mg | INTRAVENOUS | Status: DC
  Administered 2020-04-11 – 2020-04-12 (×2): 1250 mg via INTRAVENOUS
  Filled 2020-04-10 (×2): qty 250

## 2020-04-10 NOTE — Progress Notes (Signed)
[facility] Progress Note     Subjective: CC-  Sitting up in bed eating breakfast. Denies abdominal pain, nausea, vomiting. Passing a lot of flatus, no BM since admission.  Still has some wheezing, although respiratory status overall improving. Getting lasix this morning. Therapies recommending SNF, but patient/family refusing and she plans to go home. Son lives with her and she has a housekeeper that will be with her when family cannot.  Objective: Vital signs in last 24 hours: Temp:  [98.1 F (36.7 C)-99.6 F (37.6 C)] 99.6 F (37.6 C) (05/10 0540) Pulse Rate:  [56-95] 91 (05/10 0839) Resp:  [19-29] 19 (05/10 0540) BP: (108-197)/(56-104) 147/77 (05/10 0540) SpO2:  [94 %-100 %] 94 % (05/10 0839) Last BM Date: 04/04/20  Intake/Output from previous day: 05/09 0701 - 05/10 0700 In: 679.3 [P.O.:575; I.V.:104.3] Out: 2450 [Urine:2450] Intake/Output this shift: No intake/output data recorded.  PE: Gen:  Alert, NAD, pleasant HEENT: EOM's intact, pupils equal and round Card:  RRR Pulm:  Mild tachypnea, trace expiratory wheezing Abd: Soft, nontender, mild distension, +BS, lap incisions cdi  Lab Results:  Recent Labs    04/09/20 0316 04/10/20 0357  WBC 12.8* 14.6*  HGB 9.3* 8.4*  HCT 30.0* 26.4*  PLT 223 210   BMET Recent Labs    04/09/20 0316 04/10/20 0357  NA 140 141  K 3.6 3.1*  CL 110 110  CO2 19* 21*  GLUCOSE 114* 137*  BUN 32* 30*  CREATININE 0.92 0.89  CALCIUM 8.2* 7.8*   PT/INR Recent Labs    04/07/20 1300  LABPROT 16.1*  INR 1.3*   CMP     Component Value Date/Time   NA 141 04/10/2020 0357   K 3.1 (L) 04/10/2020 0357   CL 110 04/10/2020 0357   CO2 21 (L) 04/10/2020 0357   GLUCOSE 137 (H) 04/10/2020 0357   BUN 30 (H) 04/10/2020 0357   CREATININE 0.89 04/10/2020 0357   CREATININE 0.76 08/28/2015 1025   CALCIUM 7.8 (L) 04/10/2020 0357   PROT 6.3 (L) 04/09/2020 0316   PROT 6.9 05/25/2018 0955   ALBUMIN 2.8 (L) 04/09/2020 0316    ALBUMIN 4.2 05/25/2018 0955   AST 21 04/09/2020 0316   ALT 19 04/09/2020 0316   ALKPHOS 85 04/09/2020 0316   BILITOT 1.1 04/09/2020 0316   BILITOT 0.4 05/25/2018 0955   GFRNONAA 59 (L) 04/10/2020 0357   GFRAA >60 04/10/2020 0357   Lipase     Component Value Date/Time   LIPASE 17 04/06/2020 1831       Studies/Results: CT ABDOMEN PELVIS WO CONTRAST  Result Date: 04/08/2020 CLINICAL DATA:  Postop abdominal pain and fever. Recent cholecystectomy. EXAM: CT ABDOMEN AND PELVIS WITHOUT CONTRAST TECHNIQUE: Multidetector CT imaging of the abdomen and pelvis was performed following the standard protocol without IV contrast. COMPARISON:  01/25/2020 FINDINGS: Lower chest: New small right pleural effusion. Dependent atelectasis/consolidation posteriorly in the right lower lobe. Coronary calcifications. Hepatobiliary: Surgical clips from cholecystectomy. Low attenuation material in the gallbladder fossa with scattered gas attenuation. No focal liver lesion. The CBD is prominent measuring up to 1.7 cm diameter, down to the ampulla level. Mild central intrahepatic biliary ductal dilatation. Pancreas: Mild diffuse atrophy without mass or ductal dilatation. Spleen: Normal in size without focal abnormality. Adrenals/Urinary Tract: Stable 1.9 cm left adrenal nodule. Kidneys unremarkable. Urinary bladder decompressed by Foley catheter. Stomach/Bowel: Stomach is decompressed. Small bowel is nondilated. Appendix surgically absent. Gas and moderate fecal material in the proximal, decompressed distally. Vascular/Lymphatic: Heavy aortoiliac arterial  calcifications. Infrarenal aorta 2.7 cm diameter. Metallic stent in the right common iliac artery. No definite abdominal or pelvic adenopathy. Reproductive: Status post hysterectomy. No adnexal masses. Other: Small volume perihepatic ascites. No free air. Scattered gas bubbles in the abdominal body wall presumably related to recent surgery. Musculoskeletal: Degenerative disc  disease L5-S1. Old healed sternal fracture. No acute fracture or worrisome bone lesion. IMPRESSION: 1. Small volume perihepatic ascites post cholecystectomy. 2. Mild central intrahepatic biliary duct dilatation and distended CBD. Correlate with any clinical or laboratory evidence of biliary obstruction. 3. New small right pleural effusion with dependent atelectasis/consolidation in the right lower lobe. 4. Low-attenuation material in the gallbladder fossa with scattered gas attenuation, possibly related to Surgicel from cholecystectomy. 5. Stable left adrenal nodule. Aortic Atherosclerosis (ICD10-I70.0). Electronically Signed   By: Lucrezia Europe M.D.   On: 04/08/2020 12:24   DG Chest 1 View  Result Date: 04/09/2020 CLINICAL DATA:  Shortness of breath EXAM: CHEST  1 VIEW COMPARISON:  Apr 06, 2020 FINDINGS: The patient's mandible obscures much of the right apex. There is a small right pleural effusion. There is atelectatic change in the right mid lung and right base regions. The visualized lungs elsewhere clear. Heart is mildly enlarged with pulmonary vascularity normal. Aorta is prominent with aortic atherosclerosis, stable. There is a chondroid matrix lesion in the proximal right humerus, most likely either an enchondroma or bone infarct. IMPRESSION: Right apex obscured by patient's mandible. Areas of mild atelectasis on the right with small right pleural effusion. Lungs elsewhere clear. Stable cardiac silhouette. Aortic Atherosclerosis (ICD10-I70.0). Stable chondroid matrix lesion in proximal right humerus. Electronically Signed   By: Lowella Grip III M.D.   On: 04/09/2020 11:22    Anti-infectives: Anti-infectives (From admission, onward)   None       Assessment/Plan HTN HLD A fib on eliquis PVD on plavix COPD  AKI - resolved Acute hypoxic hypercarbic respiratory failure - improving, off Bipap, suspect respiratory depression secondary to tramadol overuse  S/p laparoscopic cholecystectomy  04/04/20 Dr. Grandville Silos - POD#6 - CT 5/8 without surgical complications, LFTs WNL  ID - none VTE - SCDs, eliquis FEN - HH diet, Ensure Foley - none Follow up - Dr. Grandville Silos  Plan: Continue miralax, add dulcolax suppository PRN. Ok for discharge from surgical standpoint once cleared medically, sounds like this may be tomorrow. Follow up with Dr. Grandville Silos on AVS. General surgery will sign off, please call with concerns.    LOS: 3 days    Wellington Hampshire, [facility] Surgery 04/10/2020, 8:55 AM Please see Amion for pager number during day hours 7:00am-4:30pm

## 2020-04-10 NOTE — Plan of Care (Signed)

## 2020-04-10 NOTE — Progress Notes (Signed)
Pharmacy Antibiotic Note  Cheryl Atkinson is a 86 y.o. female admitted on 04/06/2020 with AMS.  Pharmacy has been consulted for vancomycin & cefepime dosing for HCAP. She was given zosyn 3.375 gm IV x 1 per MD today at 1347 She was given vancomycin 1 gm IV x 1 per MD today at 1351  Plan: Vancomycin 1250 mg IV q24 for VT 15-20 Cefepime 2 gm IV q12h start tonight MRSA PCR is negative> consider stopping vancomycin F/u renal fxn, WBC, temp, culture data Vancomycin trough as needed  Height: 5\' 6"  (167.6 cm) Weight: 64.5 kg (142 lb 3.2 oz) IBW/kg (Calculated) : 59.3  Temp (24hrs), Avg:99.3 F (37.4 C), Min:98 F (36.7 C), Max:101.4 F (38.6 C)  Recent Labs  Lab 04/06/20 1831 04/06/20 2006 04/07/20 0500 04/08/20 0520 04/09/20 0316 04/10/20 0357  WBC 20.4*  --  15.5* 12.9* 12.8* 14.6*  CREATININE 2.32*  --  2.38* 1.66* 0.92 0.89  LATICACIDVEN  --  1.3  --   --   --   --     Estimated Creatinine Clearance: 42.5 mL/min (by C-G formula based on SCr of 0.89 mg/dL).    Allergies  Allergen Reactions  . Codeine Other (See Comments)    REACTION: Syncope     Antimicrobials this admission: 5/10 zosyn x 1 5/10 cefepime>> 5/10 vanc>> Dose adjustments this admission:  Microbiology results: 5/10 BCx2: sent 5/7 MRSA PCR neg 5/6 flu/covid neg  Thank you for allowing pharmacy to be a part of this patient's care.  7/7, Pharm.D 04/10/2020 4:04 PM

## 2020-04-10 NOTE — TOC Initial Note (Signed)
Transition of Care [facility]) - Initial/Assessment Note    Patient Details  Name: Cheryl Atkinson MRN: [id number] Date of Birth: [birth year]  [facility] CM/SW Contact:    Ross Ludwig, LCSW Phone Number: 04/10/2020, 4:57 PM  Clinical Narrative:                  Patient is an 86 year old female who is alert and oriented x1.  Patient has some dementia, CSW completed assessment by speaking to her son.  Patient lives with her son, and they also have a housekeeper/caregiver at the home when son is unavailable.  Patient's son stated he does not want her to go to a SNF for rehab despite PT recommendations.  CSW was informed they would prefer home health services, and would like [facility], since they have had them in the past.  CSW discussed the different choice of agencies, and he still chose [facility].  Patient's son stated they have a walker, bedside commode, wheelchair, he does not feel that she needs any other equipment.  Patient's son would like her to home with home health once she is medically ready for discharge.  Expected Discharge Plan: [place] Barriers to Discharge: Continued Medical Work up   Patient Goals and CMS Choice Patient states their goals for this hospitalization and ongoing recovery are:: Plan to return home with son and home health services. CMS Medicare.gov Compare Post Acute Care list provided to:: Patient Represenative (must comment) Choice offered to / list presented to : Adult Children  Expected Discharge Plan and Services Expected Discharge Plan: [place] Choice: [place] arrangements for the past 2 months: Single Family Home Expected Discharge Date: (unknown)                 DME Agency: NA       HH Arranged: PT, OT, Nurse's Aide, RN, Social Work, Refused SNF          Prior Living Arrangements/Services Living arrangements for the past 2 months: [place] Guerra Lives  with:: Adult Children Patient language and need for interpreter reviewed:: Yes Do you feel safe going back to the place where you live?: Yes      Need for Family Participation in Patient Care: Yes (Comment) Care giver support system in place?: No (comment)   Criminal Activity/Legal Involvement Pertinent to Current Situation/Hospitalization: No - Comment as needed  Activities of Daily Living Home Assistive Devices/Equipment: Cane (specify quad or straight), Walker (specify type), Eyeglasses, Bedside commode/3-in-1, Wheelchair, Shower chair with back(single point cane, front wheeled walker) ADL Screening (condition at time of admission) Patient's cognitive ability adequate to safely complete daily activities?: No(patient unresponsive) Is the patient deaf or have difficulty hearing?: No Does the patient have difficulty seeing, even when wearing glasses/contacts?: No Does the patient have difficulty concentrating, remembering, or making decisions?: Yes Patient able to express need for assistance with ADLs?: No Does the patient have difficulty dressing or bathing?: Yes Independently performs ADLs?: No Communication: Dependent(unresponsive) Is this a change from baseline?: Change from baseline, expected to last >3 days Dressing (OT): Dependent Is this a change from baseline?: Change from baseline, expected to last >3 days Grooming: Dependent Is this a change from baseline?: Change from baseline, expected to last >3 days Feeding: Dependent Is this a change from baseline?: Change from baseline, expected to last >3 days Bathing: Dependent Is this a change from baseline?: Change  from baseline, expected to last >3 days Toileting: Dependent Is this a change from baseline?: Change from baseline, expected to last >3days In/Out Bed: Dependent Is this a change from baseline?: Change from baseline, expected to last >3 days Walks in Home: Dependent Is this a change from baseline?: Change from  baseline, expected to last >3 days Does the patient have difficulty walking or climbing stairs?: Yes(secondary to weakness) Weakness of Legs: Both Weakness of Arms/Hands: Both  Permission Sought/Granted Permission sought to share information with : Family Supports Permission granted to share information with : Yes, Verbal Permission Granted  Share Information with NAME: Armilda, [place] [phone]  [phone]  Permission granted to share info w AGENCY: Home health agencies        Emotional Assessment Appearance:: Appears stated age   Affect (typically observed): Calm, Appropriate, Stable Orientation: : Oriented to Self Alcohol / Substance Use: Not Applicable Psych Involvement: No (comment)  Admission diagnosis:  Acute respiratory failure (HCC) [J96.00] Acute respiratory failure with hypoxia (HCC) [J96.01] AKI (acute kidney injury) (HCC) [N17.9] Narcotic-induced respiratory depression [R06.89, T40.605A] Acute on chronic respiratory failure with hypoxia and hypercapnia (HCC) [W10.93, J96.22] Patient Active Problem List   Diagnosis Date Noted  . Encephalopathy 04/07/2020  . AKI (acute kidney injury) (HCC) 04/07/2020  . Acute respiratory failure with hypoxia (HCC) 04/07/2020  . Acute respiratory failure (HCC) 04/06/2020  . S/P laparoscopic cholecystectomy 04/04/2020  . CHF (congestive heart failure) (HCC) 03/01/2020  . Insomnia 03/01/2020  . Bacteremia 01/28/2020  . Acute cholangitis 01/25/2020  . Atrial fibrillation (HCC) 01/25/2020  . Acute cholecystitis 12/29/2019  . Adrenal nodule (HCC) 12/29/2019  . Abdominal aortic ectasia (HCC) 12/29/2019  . Major depression 03/25/2018  . Hypokalemia 12/21/2016  . Cat bite of right hand 12/21/2016  . BPPV (benign paroxysmal positional vertigo) 07/12/2016  . Memory loss 04/11/2016  . Mallet toe of right foot 10/20/2015  . Renal artery stenosis (HCC) 09/27/2015  . Hyperlipidemia 06/30/2015  . Claudication (HCC) 05/04/2015  .  Atherosclerotic PVD with intermittent claudication (HCC) 04/26/2015  . Tinnitus 12/31/2014  . Former smoker 09/29/2014  . DNR (do not resuscitate) 09/29/2014  . Insulin resistance 07/19/2014  . Multinodular goiter 08/30/2013  . Spinal stenosis of lumbar region at multiple levels 09/29/2012  . HIP PAIN, BILATERAL 07/16/2010  . COPD (chronic obstructive pulmonary disease) (HCC) 09/20/2009  . CONSTIPATION, CHRONIC 09/20/2009  . Iron deficiency anemia 11/09/2007  . History of UTI 11/09/2007  . RESTLESS LEG SYNDROME 09/25/2007  . Essential hypertension 09/25/2007  . GERD 09/25/2007  . LOW BACK PAIN 09/25/2007  . Osteoporosis 09/25/2007   PCP:  Shelva Majestic, MD Pharmacy:   [facility] - [address], [place] - [address] [place] [address] Phone: [phone] Fax: [phone]  [id number] [place], [place] - [address] AT [facility] OF [address] & [address] [place] [address] Phone: [phone] Fax: [phone]  [facility] - [place], [place] - [address] [place] [address] Phone: [phone] Fax: [phone]     Social Determinants of Health (SDOH) Interventions    Readmission Risk Interventions No flowsheet data found.

## 2020-04-10 NOTE — Progress Notes (Signed)
PROGRESS NOTE    Cheryl Atkinson  ZDG:[id number] DOB: 1931-11-06 DOA: 04/06/2020 PCP: Shelva Majestic, MD   Brief Narrative: Patient is a 86 year old female with history of persistent A. fib, chronic diastolic CHF, COPD, GERD, hypertension, hyperlipidemia, peripheral vascular disease who was recently admitted here for cholecystitis and underwent laparoscopic cholecystectomy.  She was discharged from here on 04/05/2020 and prescribed tramadol for pain.  Patient was brought back to the emerge department by family because of concern of lethargy, confusion.  He saturated 87% on room air, had to be placed on supplemental oxygen and eventually on BiPAP due to decreased mentation, somnolence.  ABG showed hypercarbia.  She was also febrile on presentation with leukocytosis.  Found to have AKI.  Chest x-ray did not show any acute abnormality.  Also appeared very dehydrated on presentation. She had to be started on Narcan as needed for opioid toxicity. Currently she is on room air.General surgery following.  She remains weak a, PT recommended skilled nursing facility today.  Today she appeared weak, little short of breath.  Plan for discharge tomorrow to home if she feels better tomorrow.  Assessment & Plan:   Principal Problem:   Acute respiratory failure (HCC) Active Problems:   COPD (chronic obstructive pulmonary disease) (HCC)   S/P laparoscopic cholecystectomy   Encephalopathy   AKI (acute kidney injury) (HCC)   Acute respiratory failure with hypoxia (HCC)   Acute hypoxic hypercarbic respiratory failure: Most likely secondary to respiratory depression secondary to tramadol overuse.  She was just discharged from here after cholecystectomy and was prescribed tramadol for pain.  Chest x-ray does not show any acute findings.  She was hypoxic on room air and had to be put on BiPAP on admission.  ABG showed hypercarbia with CO2 of 68.  Mentation has improved after receiving has been placed on BiPAP and  with  Narcan.  Hold any opiates or sedating medications.   CT head did not show any acute intracranial abnormalities. Mental and respiratory status improved . Currently she is maintaining her saturation on room air.  Off BiPAP.  Appears little tachypneic today.  Auscultation revealed bilateral faint wheezes. Chest x-ray done yesterday does not show any pneumonia.  Most likely she became volume overloaded after she was given IV fluids for her AKI. Her home Lasix has been restarted.  We will give her a dose of 40 mg of IV Lasix once.  AKI: Resolved with IV fluids.  Renal ultrasound did not show any acute findings.Foley was placed on admission which has been discontinued.  Leukocytosis: Improving.Unclear etiology.  No signs of infection.No indication for antibiotic therapy for now.CXR does not show pneumonia.  Acute cholecystitis status post laparoscopic cholecystectomy on 04/04/2020: Presented with significant leukocytosis, fever of 100 F.  Noted hypotension on arrival, lactic cidosis.  General surgery following.  No abdominal pain.  General surgery did not suspect any acute intra-abdominal findings.  Now hemodynamically stable. General surgery ordered CT abdomen/pelvis to rule out if there is any complication of recent surgery.No acute findings.  Hypokalemia: Being supplemented with potassium.  Check BMP tomorrow.  Permanent A. fib: Currently rate is controlled.  On Eliquis for anticoagulation.  Not on any rate control agents.  She was started  on heparin drip as Eliquis was  held because of deranged renal function.  Eliquis resumed.  Peripheral vascular disease: On Plavix, Lipitor  COPD: Currently stable.  No signs of acute exacerbation.  Mild wheezing today.  Continue bronchodilators as needed.  Hypertension: On  ARB at home which is which has been resumed.  Continue to monitor.   Debility/deconditioning: PT recommended skilled nursing facility.  Patient and her son preferred home with home  health.         DVT prophylaxis: Eliquis Code Status: DNR Family Communication: Called and discussed with  Son on phone on 04/10/20 Status is: inaptient   Dispo: The patient is from: Home              Anticipated d/c is to: Home              Anticipated d/c date is:1 day              Patient currently is not medically stable to d/c.   Wheezing and tachypnea today.  Plan for discharge tomorrow.  Consultants: Surgery  Procedures:None  Antimicrobials:  Anti-infectives (From admission, onward)   None      Subjective:  Patient seen and examined at the bedside this morning.  Hemodynamically stable but complains of some shortness of breath.  She had tachypnea and mild wheezing on auscultation.  Feels weak.  Does not feel ready to go home today.  Objective: Vitals:   04/09/20 2212 04/10/20 0208 04/10/20 0540 04/10/20 0839  BP: (!) 108/56 139/75 (!) 147/77   Pulse: (!) 56 76 81 91  Resp: 19 19 19    Temp: 98.4 F (36.9 C) 98.2 F (36.8 C) 99.6 F (37.6 C)   TempSrc: Oral Oral Oral   SpO2: 95% 95% 95% 94%  Weight:      Height:        Intake/Output Summary (Last 24 hours) at 04/10/2020 1125 Last data filed at 04/10/2020 1000 Gross per 24 hour  Intake 335 ml  Output 2050 ml  Net -1715 ml   Filed Weights   04/07/20 1621  Weight: 64.5 kg    Examination:  General exam: Generalized weakness, debilitated elderly female  Respiratory system: Bilateral mild expiratory wheezes, decreased air entry bilaterally  cardiovascular system: S1 & S2 heard, RRR. No JVD, murmurs, rubs, gallops or clicks. Gastrointestinal system: Abdomen is nondistended, soft and nontender. No organomegaly or masses felt. Normal bowel sounds heard. Central nervous system: Alert and oriented. No focal neurological deficits. Extremities: No edema, no clubbing ,no cyanosis, distal peripheral pulses palpable. Skin: No rashes, lesions or ulcers,no icterus ,no pallor  Data Reviewed: I have personally  reviewed following labs and imaging studies  CBC: Recent Labs  Lab 04/06/20 1831 04/07/20 0500 04/08/20 0520 04/09/20 0316 04/10/20 0357  WBC 20.4* 15.5* 12.9* 12.8* 14.6*  NEUTROABS 17.5*  --  11.0* 10.9* 12.4*  HGB 10.7* 9.0* 9.3* 9.3* 8.4*  HCT 35.5* 30.3* 30.3* 30.0* 26.4*  MCV 92.2 93.5 92.7 89.6 86.3  PLT 223 194 206 223 210   Basic Metabolic Panel: Recent Labs  Lab 04/06/20 1831 04/07/20 0500 04/08/20 0520 04/09/20 0316 04/10/20 0357  NA 137 136 140 140 141  K 5.5* 5.0 4.5 3.6 3.1*  CL 102 103 109 110 110  CO2 23 24 20* 19* 21*  GLUCOSE 119* 97 84 114* 137*  BUN 44* 50* 53* 32* 30*  CREATININE 2.32* 2.38* 1.66* 0.92 0.89  CALCIUM 8.8* 8.3* 8.1* 8.2* 7.8*   GFR: Estimated Creatinine Clearance: 42.5 mL/min (by C-G formula based on SCr of 0.89 mg/dL). Liver Function Tests: Recent Labs  Lab 04/04/20 0847 04/06/20 1831 04/09/20 0316  AST 16 27 21   ALT 14 20 19   ALKPHOS 87 93 85  BILITOT 0.5 0.9 1.1  PROT 7.4 7.1 6.3*  ALBUMIN 3.8 3.4* 2.8*   Recent Labs  Lab 04/06/20 1831  LIPASE 17   No results for input(s): AMMONIA in the last 168 hours. Coagulation Profile: Recent Labs  Lab 04/04/20 0847 04/07/20 1300  INR 1.1 1.3*   Cardiac Enzymes: No results for input(s): CKTOTAL, CKMB, CKMBINDEX, TROPONINI in the last 168 hours. BNP (last 3 results) Recent Labs    02/11/20 0936  PROBNP 228.0*   HbA1C: No results for input(s): HGBA1C in the last 72 hours. CBG: Recent Labs  Lab 04/08/20 1939  GLUCAP 108*   Lipid Profile: No results for input(s): CHOL, HDL, LDLCALC, TRIG, CHOLHDL, LDLDIRECT in the last 72 hours. Thyroid Function Tests: No results for input(s): TSH, T4TOTAL, FREET4, T3FREE, THYROIDAB in the last 72 hours. Anemia Panel: No results for input(s): VITAMINB12, FOLATE, FERRITIN, TIBC, IRON, RETICCTPCT in the last 72 hours. Sepsis Labs: Recent Labs  Lab 04/06/20 2006  LATICACIDVEN 1.3    Recent Results (from the past 240  hour(s))  SARS CORONAVIRUS 2 (TAT 6-24 HRS) Nasopharyngeal Nasopharyngeal Swab     Status: None   Collection Time: 03/31/20 12:54 PM   Specimen: Nasopharyngeal Swab  Result Value Ref Range Status   SARS Coronavirus 2 NEGATIVE NEGATIVE Final    Comment: (NOTE) SARS-CoV-2 target nucleic acids are NOT DETECTED. The SARS-CoV-2 RNA is generally detectable in upper and lower respiratory specimens during the acute phase of infection. Negative results do not preclude SARS-CoV-2 infection, do not rule out co-infections with other pathogens, and should not be used as the sole basis for treatment or other patient management decisions. Negative results must be combined with clinical observations, patient history, and epidemiological information. The expected result is Negative. Fact Sheet for Patients: HairSlick.no Fact Sheet for Healthcare Providers: quierodirigir.com This test is not yet approved or cleared by the [place] FDA and  has been authorized for detection and/or diagnosis of SARS-CoV-2 by FDA under an Emergency Use Authorization (EUA). This EUA will remain  in effect (meaning this test can be used) for the duration of the COVID-19 declaration under Section 56 4(b)(1) of the Act, 21 U.S.C. section 360bbb-3(b)(1), unless the authorization is terminated or revoked sooner. Performed at [facility], [address] [place], [place] [address]   Respiratory Panel by RT PCR (Flu A&B, Covid) - Nasopharyngeal Swab     Status: None   Collection Time: 04/06/20  6:37 PM   Specimen: Nasopharyngeal Swab  Result Value Ref Range Status   SARS Coronavirus 2 by RT PCR NEGATIVE NEGATIVE Final    Comment: (NOTE) SARS-CoV-2 target nucleic acids are NOT DETECTED. The SARS-CoV-2 RNA is generally detectable in upper respiratoy specimens during the acute phase of infection. The lowest concentration of SARS-CoV-2 viral copies this assay can  detect is 131 copies/mL. A negative result does not preclude SARS-Cov-2 infection and should not be used as the sole basis for treatment or other patient management decisions. A negative result may occur with  improper specimen collection/handling, submission of specimen other than nasopharyngeal swab, presence of viral mutation(s) within the areas targeted by this assay, and inadequate number of viral copies (<131 copies/mL). A negative result must be combined with clinical observations, patient history, and epidemiological information. The expected result is Negative. Fact Sheet for Patients:  https://www.moore.com/ Fact Sheet for Healthcare Providers:  https://www.young.biz/ This test is not yet ap proved or cleared by the [place] FDA and  has been authorized for detection and/or diagnosis of SARS-CoV-2  by FDA under an Emergency Use Authorization (EUA). This EUA will remain  in effect (meaning this test can be used) for the duration of the COVID-19 declaration under Section 564(b)(1) of the Act, 21 U.S.C. section 360bbb-3(b)(1), unless the authorization is terminated or revoked sooner.    Influenza A by PCR NEGATIVE NEGATIVE Final   Influenza B by PCR NEGATIVE NEGATIVE Final    Comment: (NOTE) The Xpert Xpress SARS-CoV-2/FLU/RSV assay is intended as an aid in  the diagnosis of influenza from Nasopharyngeal swab specimens and  should not be used as a sole basis for treatment. Nasal washings and  aspirates are unacceptable for Xpert Xpress SARS-CoV-2/FLU/RSV  testing. Fact Sheet for Patients: https://www.moore.com/https://www.fda.gov/media/142436/download Fact Sheet for Healthcare Providers: https://www.young.biz/https://www.fda.gov/media/142435/download This test is not yet approved or cleared by the [place] FDA and  has been authorized for detection and/or diagnosis of SARS-CoV-2 by  FDA under an Emergency Use Authorization (EUA). This EUA will remain  in effect (meaning  this test can be used) for the duration of the  Covid-19 declaration under Section 564(b)(1) of the Act, 21  U.S.C. section 360bbb-3(b)(1), unless the authorization is  terminated or revoked. Performed at [facility], [address] [place], [place] [address]   MRSA PCR Screening     Status: None   Collection Time: 04/07/20  4:02 PM   Specimen: Nasal Mucosa; Nasopharyngeal  Result Value Ref Range Status   MRSA by PCR NEGATIVE NEGATIVE Final    Comment:        The GeneXpert MRSA Assay (FDA approved for NASAL specimens only), is one component of a comprehensive MRSA colonization surveillance program. It is not intended to diagnose MRSA infection nor to guide or monitor treatment for MRSA infections. Performed at [facility], [address] [place], [place] [address]          Radiology Studies: CT ABDOMEN PELVIS WO CONTRAST  Result Date: 04/08/2020 CLINICAL DATA:  Postop abdominal pain and fever. Recent cholecystectomy. EXAM: CT ABDOMEN AND PELVIS WITHOUT CONTRAST TECHNIQUE: Multidetector CT imaging of the abdomen and pelvis was performed following the standard protocol without IV contrast. COMPARISON:  01/25/2020 FINDINGS: Lower chest: New small right pleural effusion. Dependent atelectasis/consolidation posteriorly in the right lower lobe. Coronary calcifications. Hepatobiliary: Surgical clips from cholecystectomy. Low attenuation material in the gallbladder fossa with scattered gas attenuation. No focal liver lesion. The CBD is prominent measuring up to 1.7 cm diameter, down to the ampulla level. Mild central intrahepatic biliary ductal dilatation. Pancreas: Mild diffuse atrophy without mass or ductal dilatation. Spleen: Normal in size without focal abnormality. Adrenals/Urinary Tract: Stable 1.9 cm left adrenal nodule. Kidneys unremarkable. Urinary bladder decompressed by Foley catheter. Stomach/Bowel: Stomach is decompressed. Small bowel is  nondilated. Appendix surgically absent. Gas and moderate fecal material in the proximal, decompressed distally. Vascular/Lymphatic: Heavy aortoiliac arterial calcifications. Infrarenal aorta 2.7 cm diameter. Metallic stent in the right common iliac artery. No definite abdominal or pelvic adenopathy. Reproductive: Status post hysterectomy. No adnexal masses. Other: Small volume perihepatic ascites. No free air. Scattered gas bubbles in the abdominal body wall presumably related to recent surgery. Musculoskeletal: Degenerative disc disease L5-S1. Old healed sternal fracture. No acute fracture or worrisome bone lesion. IMPRESSION: 1. Small volume perihepatic ascites post cholecystectomy. 2. Mild central intrahepatic biliary duct dilatation and distended CBD. Correlate with any clinical or laboratory evidence of biliary obstruction. 3. New small right pleural effusion with dependent atelectasis/consolidation in the right lower lobe. 4. Low-attenuation material in the gallbladder  fossa with scattered gas attenuation, possibly related to Surgicel from cholecystectomy. 5. Stable left adrenal nodule. Aortic Atherosclerosis (ICD10-I70.0). Electronically Signed   By: Lucrezia Europe M.D.   On: 04/08/2020 12:24   DG Chest 1 View  Result Date: 04/09/2020 CLINICAL DATA:  Shortness of breath EXAM: CHEST  1 VIEW COMPARISON:  Apr 06, 2020 FINDINGS: The patient's mandible obscures much of the right apex. There is a small right pleural effusion. There is atelectatic change in the right mid lung and right base regions. The visualized lungs elsewhere clear. Heart is mildly enlarged with pulmonary vascularity normal. Aorta is prominent with aortic atherosclerosis, stable. There is a chondroid matrix lesion in the proximal right humerus, most likely either an enchondroma or bone infarct. IMPRESSION: Right apex obscured by patient's mandible. Areas of mild atelectasis on the right with small right pleural effusion. Lungs elsewhere clear.  Stable cardiac silhouette. Aortic Atherosclerosis (ICD10-I70.0). Stable chondroid matrix lesion in proximal right humerus. Electronically Signed   By: Lowella Grip III M.D.   On: 04/09/2020 11:22    Scheduled Meds: . apixaban  5 mg Oral BID  . atorvastatin  40 mg Oral Daily  . clopidogrel  75 mg Oral Daily  . feeding supplement (ENSURE ENLIVE)  237 mL Oral BID BM  . furosemide  40 mg Oral QODAY  . ipratropium-albuterol  3 mL Nebulization Q6H  . irbesartan  300 mg Oral Daily  . polyethylene glycol  17 g Oral BID  . potassium chloride  40 mEq Oral Q2H   Continuous Infusions:    LOS: 3 days    Time spent: 35 mins.More than 50% of that time was spent in counseling and/or coordination of care.    Shelly Coss, MD [facility] P5/09/2020, 11:25 AM

## 2020-04-10 NOTE — Care Management Important Message (Signed)
Important Message  Patient Details IM Letter given to [name] SW Case Manager to present to the Patient Name: Cheryl Atkinson MRN: [id number] Date of Birth: Nov 24, 1931   Medicare Important Message Given:  Yes     Caren Macadam 04/10/2020, 10:42 AM

## 2020-04-10 NOTE — Progress Notes (Signed)
   04/10/20 1244 04/10/20 1249  Assess: MEWS Score  Temp 98 F (36.7 C) (!) 101.4 F (38.6 C)  BP (!) 157/78  --   Pulse Rate 99  --   Resp (!) 32  --   Assess: MEWS Score  MEWS Temp 0 1  MEWS Systolic 0 0  MEWS Pulse 0 0  MEWS RR 2 2  MEWS LOC 0 0  MEWS Score 2 3  MEWS Score Color Yellow Yellow  Assess: if the MEWS score is Yellow or Red  Were vital signs taken at a resting state?  --  Yes  Focused Assessment  --  Documented focused assessment  Early Detection of Sepsis Score *See Row Information*  --  High  MEWS guidelines implemented *See Row Information*  --  Yes  Treat  MEWS Interventions  --  Administered scheduled meds/treatments;Escalated (See documentation below)  Take Vital Signs  Increase Vital Sign Frequency   --  Yellow: Q 2hr X 2 then Q 4hr X 2, if remains yellow, continue Q 4hrs  Escalate  MEWS: Escalate  --  Yellow: discuss with charge nurse/RN and consider discussing with provider and RRT  Notify: Charge Nurse/RN  Name of Charge Nurse/RN Notified  --  Deriana Vanderhoef, RN  Date Charge Nurse/RN Notified  --  04/10/20  Time Charge Nurse/RN Notified  --  1250  Notify: Provider  Provider Name/Title  --  Dr. Renford Dills  Date Provider Notified  --  04/10/20  Time Provider Notified  --  1250  Notification Type  --  Page   MD notified of change in status. Yellow MEWS protocol intiated. Will continue to monitor pt closely.

## 2020-04-10 NOTE — Progress Notes (Signed)
   04/10/20 1249  Assess: MEWS Score  Temp (!) 101.4 F (38.6 C)  Assess: MEWS Score  MEWS Temp 1  MEWS Systolic 0  MEWS Pulse 0  MEWS RR 2  MEWS LOC 0  MEWS Score 3  MEWS Score Color Yellow  Assess: if the MEWS score is Yellow or Red  Were vital signs taken at a resting state? Yes  Focused Assessment Documented focused assessment  Early Detection of Sepsis Score *See Row Information* High  MEWS guidelines implemented *See Row Information* Yes  Treat  MEWS Interventions Administered scheduled meds/treatments;Escalated (See documentation below)  Take Vital Signs  Increase Vital Sign Frequency  Yellow: Q 2hr X 2 then Q 4hr X 2, if remains yellow, continue Q 4hrs  Escalate  MEWS: Escalate Yellow: discuss with charge nurse/RN and consider discussing with provider and RRT  Notify: Charge Nurse/RN  Name of Charge Nurse/RN Notified Abby Pinnix, RN  Date Charge Nurse/RN Notified 04/10/20  Time Charge Nurse/RN Notified 1250  Notify: Provider  Provider Name/Title Dr. Renford Dills  Date Provider Notified 04/10/20  Time Provider Notified 1250  Notification Type Page  Notification Reason Change in status  Response See new orders  Date of Provider Response 04/10/20  Time of Provider Response 1252

## 2020-04-11 ENCOUNTER — Inpatient Hospital Stay (HOSPITAL_COMMUNITY): Payer: Medicare Other

## 2020-04-11 ENCOUNTER — Encounter (HOSPITAL_COMMUNITY): Payer: Self-pay | Admitting: Internal Medicine

## 2020-04-11 LAB — BASIC METABOLIC PANEL
Anion gap: 11 (ref 5–15)
BUN: 24 mg/dL — ABNORMAL HIGH (ref 8–23)
CO2: 21 mmol/L — ABNORMAL LOW (ref 22–32)
Calcium: 7.8 mg/dL — ABNORMAL LOW (ref 8.9–10.3)
Chloride: 109 mmol/L (ref 98–111)
Creatinine, Ser: 0.89 mg/dL (ref 0.44–1.00)
GFR calc Af Amer: >60 mL/min (ref >60)
GFR calc non Af Amer: 59 mL/min — ABNORMAL LOW (ref >60)
Glucose, Bld: 125 mg/dL — ABNORMAL HIGH (ref 70–99)
Potassium: 3.7 mmol/L (ref 3.5–5.1)
Sodium: 141 mmol/L (ref 135–145)

## 2020-04-11 LAB — CBC WITH DIFFERENTIAL/PLATELET
Abs Immature Granulocytes: 0.73 10*3/uL — ABNORMAL HIGH (ref 0.00–0.07)
Basophils Absolute: 0 10*3/uL (ref 0.0–0.1)
Basophils Relative: 0 %
Eosinophils Absolute: 0.1 10*3/uL (ref 0.0–0.5)
Eosinophils Relative: 0 %
HCT: 27.5 % — ABNORMAL LOW (ref 36.0–46.0)
Hemoglobin: 8.9 g/dL — ABNORMAL LOW (ref 12.0–15.0)
Immature Granulocytes: 4 %
Lymphocytes Relative: 6 %
Lymphs Abs: 1 10*3/uL (ref 0.7–4.0)
MCH: 27.6 pg (ref 26.0–34.0)
MCHC: 32.4 g/dL (ref 30.0–36.0)
MCV: 85.4 fL (ref 80.0–100.0)
Monocytes Absolute: 1 10*3/uL (ref 0.1–1.0)
Monocytes Relative: 6 %
Neutro Abs: 14.1 10*3/uL — ABNORMAL HIGH (ref 1.7–7.7)
Neutrophils Relative %: 84 %
Platelets: 222 10*3/uL (ref 150–400)
RBC: 3.22 MIL/uL — ABNORMAL LOW (ref 3.87–5.11)
RDW: 14.6 % (ref 11.5–15.5)
WBC: 16.9 10*3/uL — ABNORMAL HIGH (ref 4.0–10.5)
nRBC: 0 % (ref 0.0–0.2)

## 2020-04-11 LAB — ALBUMIN, PLEURAL OR PERITONEAL FLUID: Albumin, Fluid: 1.5 g/dL

## 2020-04-11 LAB — LACTATE DEHYDROGENASE, PLEURAL OR PERITONEAL FLUID: LD, Fluid: 240 U/L — ABNORMAL HIGH (ref 3–23)

## 2020-04-11 LAB — GRAM STAIN

## 2020-04-11 LAB — BLOOD GAS, ARTERIAL
Acid-Base Excess: 1.2 mmol/L (ref 0.0–2.0)
Bicarbonate: 23.4 mmol/L (ref 20.0–28.0)
Drawn by: 331471
O2 Saturation: 97.4 %
Patient temperature: 98.6
pCO2 arterial: 28.8 mmHg — ABNORMAL LOW (ref 32.0–48.0)
pH, Arterial: 7.52 — ABNORMAL HIGH (ref 7.350–7.450)
pO2, Arterial: 91.7 mmHg (ref 83.0–108.0)

## 2020-04-11 LAB — PROTEIN, PLEURAL OR PERITONEAL FLUID: Total protein, fluid: 3 g/dL

## 2020-04-11 MED ORDER — METRONIDAZOLE IN NACL 5-0.79 MG/ML-% IV SOLN
500.0000 mg | Freq: Three times a day (TID) | INTRAVENOUS | Status: DC
  Administered 2020-04-11 – 2020-04-12 (×2): 500 mg via INTRAVENOUS
  Filled 2020-04-11 (×2): qty 100

## 2020-04-11 MED ORDER — FUROSEMIDE 40 MG PO TABS: 40.0000 mg | ORAL_TABLET | Freq: Every day | ORAL | Status: DC

## 2020-04-11 MED ORDER — IOHEXOL 300 MG/ML  SOLN
75.0000 mL | Freq: Once | INTRAMUSCULAR | Status: AC | PRN
  Administered 2020-04-11: 75 mL via INTRAVENOUS

## 2020-04-11 MED ORDER — SODIUM CHLORIDE (PF) 0.9 % IJ SOLN
INTRAMUSCULAR | Status: AC
  Filled 2020-04-11: qty 50

## 2020-04-11 MED ORDER — IPRATROPIUM-ALBUTEROL 0.5-2.5 (3) MG/3ML IN SOLN: 3.0000 mL | Freq: Four times a day (QID) | RESPIRATORY_TRACT | Status: DC | PRN

## 2020-04-11 MED ORDER — LIDOCAINE HCL 1 % IJ SOLN
INTRAMUSCULAR | Status: AC
  Filled 2020-04-11: qty 20

## 2020-04-11 NOTE — TOC Progression Note (Addendum)
Transition of Care [facility]) - Progression Note    Patient Details  Name: LYNSIE MCWATTERS MRN: [id number] Date of Birth: June 17, 1931  [facility] CM/SW Contact  Darleene Cleaver, [place] Phone Number: 04/11/2020, 4:22 PM  Clinical Narrative:     CSW spoke to patient's son on the phone, they have decided they would like to go to SNF now for short term rehab.  He stated he would prefer [facility] or [facility] either [place] or [address].  CSW was given permission to begin bed search.  Per patient's son she has had both vaccines for Covid, the second one was on 03/29/20.   Expected Discharge Plan: Home w Home Health Services Barriers to Discharge: Continued Medical Work up  Expected Discharge Plan and Services Expected Discharge Plan: Home w Home Health Services     Post Acute Care Choice: Home Health Living arrangements for the past 2 months: Single Family Home Expected Discharge Date: (unknown)                 DME Agency: NA       HH Arranged: PT, OT, Nurse's Aide, RN, Social Work, Refused SNF           Social Determinants of Health (SDOH) Interventions    Readmission Risk Interventions No flowsheet data found.

## 2020-04-11 NOTE — Progress Notes (Signed)
Physical Therapy Treatment Patient Details Name: Cheryl Atkinson MRN: [id number] DOB: Sep 21, 1931 Today's Date: 04/11/2020    History of Present Illness Patient is an 86 year old female with a history of atrial fibrillation, hypertension, COPD, CHF, prediabetes and recent Lap chole.  Pt admitted through ED with acute respiratory failure likely 2* Tramadol overuse.    PT Comments    Pt in bed with sister at bedside.  Pt AxO x 4 and very sweet.  Noted rest HR prior to activity was 101 and RA 96% but RR 26.  Assisted pt OOB required + 2 assist.  General bed mobility comments: pt required increased assist this session.  + 2 Total Assist pt 5%.  Once EOB pt required Mod Assist/support to prevent posterior LOB.  Poor Kyphotic posture and Too weak to sit on own. General transfer comment: pt unable to stand with walker despite + 2 side by side assist.  So used "NIKE" front stand pivot sit from bed to [facility] then from [facility] to recliner with a third assist to perform peri care and swith out [facility] with recliner from behind.  Positioned to comfort.  Positioned and set up lunch tray.  Noted pt coughing on liquids.   RN called to room as pt still appears WOB.  HR 107 - 115, RA 93% and RR 38.  Follow Up Recommendations  SNF     Equipment Recommendations  None recommended by PT    Recommendations for Other Services       Precautions / Restrictions Precautions Precautions: Fall Restrictions Weight Bearing Restrictions: No    Mobility  Bed Mobility Overal bed mobility: Needs Assistance Bed Mobility: Supine to Sit Rolling: +2 for safety/equipment;Modified independent (Device/Increase time)   Supine to sit: Total assist;+2 for physical assistance     General bed mobility comments: pt required increased assist this session.  + 2 Total Assist pt 5%.  Once EOB pt required Mod Assist/support to prevent posterior LOB.  Poor Kyphotic posture and Too weak to sit on own.  Transfers Overall transfer level:  Needs assistance Equipment used: Rolling walker (2 wheeled);None Transfers: Sit to/from [employer] transfer comment: pt unable to stand with walker despite + 2 side by side assist.  So used "NIKE" front stand pivot sit from bed to [facility] then from [facility] to recliner with a third assist to perform peri care and swith out [facility] with recliner from bedhind.  Ambulation/Gait             General Gait Details: Transfers only this session due to increased c/o weakness/fatigue.   Stairs             Wheelchair Mobility    Modified Rankin (Stroke Patients Only)       Balance                                            Cognition Arousal/Alertness: Awake/alert Behavior During Therapy: WFL for tasks assessed/performed Overall Cognitive Status: Within Functional Limits for tasks assessed                                 General Comments: Feels very tired and weak      Exercises      General Comments  Pertinent Vitals/Pain Pain Assessment: No/denies pain    Home Living                      Prior Function            PT Goals (current goals can now be found in the care plan section) Progress towards PT goals: Progressing toward goals    Frequency    Min 3X/week      PT Plan Current plan remains appropriate    Co-evaluation              AM-PAC PT "6 Clicks" Mobility   Outcome Measure  Help needed turning from your back to your side while in a flat bed without using bedrails?: Total Help needed moving from lying on your back to sitting on the side of a flat bed without using bedrails?: Total Help needed moving to and from a bed to a chair (including a wheelchair)?: Total Help needed standing up from a chair using your arms (e.g., wheelchair or bedside chair)?: Total Help needed to walk in hospital room?: Total Help needed climbing 3-5 steps with a railing? : Total 6  Click Score: 6    End of Session Equipment Utilized During Treatment: Gait belt Activity Tolerance: Patient limited by fatigue Patient left: in chair;with call bell/phone within reach Nurse Communication: Mobility status PT Visit Diagnosis: Muscle weakness (generalized) (M62.81);Difficulty in walking, not elsewhere classified (R26.2)     Time: 8588-5027 PT Time Calculation (min) (ACUTE ONLY): 26 min  Charges:  $Therapeutic Activity: 23-37 mins                     Rica Koyanagi  PTA Acute  Rehabilitation Services Pager      [phone] Office      [phone]

## 2020-04-11 NOTE — Evaluation (Signed)
SLP Cancellation Note  Patient Details Name: Cheryl Atkinson MRN: [id number] DOB: [birth year]   Cancelled treatment:       Reason Eval/Treat Not Completed: Other (comment)(pt currently in [place] or xray for thoracentesis and CT chest? will continue efforts)   Chales Abrahams 04/11/2020, 3:27 PM  Rolena Infante, MS [facility] SLP Acute Rehab Services Office [phone]

## 2020-04-11 NOTE — Procedures (Signed)
PROCEDURE SUMMARY:  Successful US guided right thoracentesis. Yielded 650 mL of hazy yellow fluid. Pt tolerated procedure well. No immediate complications.  Specimen was sent for labs. CT chest ordered by primary team, CXR will be deferred.  EBL < 5 mL  Brayton El PA-C 04/11/2020 2:56 PM

## 2020-04-11 NOTE — Progress Notes (Signed)
ABG delayed due to pt not on the floor.

## 2020-04-11 NOTE — Progress Notes (Signed)
PROGRESS NOTE    Cheryl Atkinson  HAL:[id number] DOB: 02/28/1931 DOA: 04/06/2020 PCP: Shelva Majestic, MD   Brief Narrative: Patient is a 86 year old female with history of persistent A. fib, chronic diastolic CHF, COPD, GERD, hypertension, hyperlipidemia, peripheral vascular disease who was recently admitted here for cholecystitis and underwent laparoscopic cholecystectomy.  She was discharged from here on 04/05/2020 and prescribed tramadol for pain.  Patient was brought back to the emerge department by family because of concern of lethargy, confusion.  He saturated 87% on room air, had to be placed on supplemental oxygen and eventually on BiPAP due to decreased mentation, somnolence.  ABG showed hypercarbia.  She was also febrile on presentation with leukocytosis.  Found to have AKI.  Chest x-ray did not show any acute abnormality.  Also appeared very dehydrated on presentation. She had to be started on Narcan as needed for opioid toxicity and was given IV fluids. Overall condition improved.  Currently she is maintaining her saturation on room air but has episodes of tachypnea.  Chest x-ray done on 04/10/2020 showed right pleural effusion. PT recommended skilled nursing facility.  Plan for ultrasound-guided thoracentesis today.  Also started on antibiotics for possible hospital-acquired pneumonia on the background of  chest x-ray findings and fever on 04/10/20.  Assessment & Plan:   Principal Problem:   Acute respiratory failure (HCC) Active Problems:   COPD (chronic obstructive pulmonary disease) (HCC)   S/P laparoscopic cholecystectomy   Encephalopathy   AKI (acute kidney injury) (HCC)   Acute respiratory failure with hypoxia (HCC)   Acute hypoxic hypercarbic respiratory failure: On presentation.Most likely secondary to respiratory depression secondary to tramadol overuse.  She was just discharged from here after cholecystectomy and was prescribed tramadol for pain.  Chest x-ray does not  show any acute findings.  She was hypoxic on room air and had to be put on BiPAP on admission.  ABG showed hypercarbia with CO2 of 68.  Mentation  improved after receiving has been placed on BiPAP and with  Narcan.  Hold any opiates or sedating medications.   CT head did not show any acute intracranial abnormalities. Mental and respiratory status improved and she has been maintaining her saturation on room air.  Off BiPAP.  Appears little tachypneic today.  Auscultation revealed bilateral faint wheezes.   Most likely she became volume overloaded after she was given IV fluids for her AKI. Her home Lasix has been restarted.  She was given a  dose of 40 mg of IV Lasix once on 04/10/20. She continues to be tachypneic today.  Chest x-ray done on 04/10/2020 showed right pleural effusion.  Will do CT chest with contrast for better evaluation of her respiratory status.  I have also requested for ultrasound-guided thoracentesis of the right chest if there is enough fluid to be taken out.  AKI: Resolved with IV fluids.  Renal ultrasound did not show any acute findings.Foley was placed on admission which has been discontinued.  Leukocytosis: Unclear etiology.  No signs of infection.she was febrile on 04/10/2020.  Continue broad-spectrum antibiotics for now  to cover for healthcare associated  pneumonia.  Acute cholecystitis status post laparoscopic cholecystectomy on 04/04/2020: Presented with significant leukocytosis, fever of 100 F.  Noted hypotension on arrival, lactic cidosis.  General surgery following.  No abdominal pain.  General surgery did not suspect any acute intra-abdominal findings. General surgery ordered CT abdomen/pelvis to rule out if there is any complication of recent surgery.No acute findings.  Hypokalemia: Being supplemented with  potassium.   Permanent A. fib: Currently rate is controlled.  On Eliquis for anticoagulation.  Not on any rate control agents.  She was started  on heparin drip as  Eliquis was  held because of deranged renal function.  Eliquis resumed.  Peripheral vascular disease: On Plavix, Lipitor  COPD: Currently stable.  No signs of acute exacerbatio.  Continue bronchodilators as needed.  Hypertension: On ARB at home which is which has been resumed.  Continue to monitor.   Debility/deconditioning: PT recommended skilled nursing facility.SW consulted and following.        DVT prophylaxis: Eliquis Code Status: DNR Family Communication: Called and discussed with  Son on phone on 04/11/20 Status is: inaptient   Dispo: The patient is from: Home              Anticipated d/c is to:SNF              Anticipated d/c date is:2-3 days              Patient currently is not medically stable to d/c.   Needs thoracentesis.  Remains tachypneic.  Consultants: Surgery  Procedures:None  Antimicrobials:  Anti-infectives (From admission, onward)   Start     Dose/Rate Route Frequency Ordered Stop   04/11/20 0800  vancomycin (VANCOREADY) IVPB 1250 mg/250 mL     1,250 mg 166.7 mL/hr over 90 Minutes Intravenous Every 24 hours 04/10/20 1602     04/10/20 2200  ceFEPIme (MAXIPIME) 2 g in sodium chloride 0.9 % 100 mL IVPB     2 g 200 mL/hr over 30 Minutes Intravenous Every 12 hours 04/10/20 1552     04/10/20 1400  vancomycin (VANCOCIN) IVPB 1000 mg/200 mL premix     1,000 mg 200 mL/hr over 60 Minutes Intravenous  Once 04/10/20 1256 04/10/20 1451   04/10/20 1400  piperacillin-tazobactam (ZOSYN) IVPB 3.375 g     3.375 g 100 mL/hr over 30 Minutes Intravenous  Once 04/10/20 1256 04/10/20 1417      Subjective:  Patient seen and examined at the bedside this morning.  Hemodynamically stable.  On room air but little tachypneic.  She reported that she feels better today but I was reported by the RN around 1230 pm that she became tachypneic and sleepy again.  Called and updated the situation to the son.  Objective: Vitals:   04/11/20 0529 04/11/20 0803 04/11/20 0806 04/11/20  1233  BP: (!) 148/69     Pulse: 83     Resp: (!) 22     Temp: 98.2 F (36.8 C)     TempSrc:      SpO2: 97% 95% 95% 93%  Weight:      Height:        Intake/Output Summary (Last 24 hours) at 04/11/2020 1247 Last data filed at 04/11/2020 0300 Gross per 24 hour  Intake 415.07 ml  Output 350 ml  Net 65.07 ml   Filed Weights   04/07/20 1621  Weight: 64.5 kg    Examination:  General exam: Elderly debilitated female, chronically looking  Respiratory system: Diminished air sounds bilaterally Cardiovascular system: S1 & S2 heard, RRR. No JVD, murmurs, rubs, gallops or clicks. Gastrointestinal system: Abdomen is nondistended, soft and nontender. No organomegaly or masses felt. Normal bowel sounds heard. Central nervous system: Alert and oriented. No focal neurological deficits. Extremities: No edema, no clubbing ,no cyanosis, distal peripheral pulses palpable. Skin: No rashes, lesions or ulcers,no icterus ,no pallor    Data Reviewed: I have personally  reviewed following labs and imaging studies  CBC: Recent Labs  Lab 04/06/20 1831 04/06/20 1831 04/07/20 0500 04/08/20 0520 04/09/20 0316 04/10/20 0357 04/11/20 0510  WBC 20.4*   < > 15.5* 12.9* 12.8* 14.6* 16.9*  NEUTROABS 17.5*  --   --  11.0* 10.9* 12.4* 14.1*  HGB 10.7*   < > 9.0* 9.3* 9.3* 8.4* 8.9*  HCT 35.5*   < > 30.3* 30.3* 30.0* 26.4* 27.5*  MCV 92.2   < > 93.5 92.7 89.6 86.3 85.4  PLT 223   < > 194 206 223 210 222   < > = values in this interval not displayed.   Basic Metabolic Panel: Recent Labs  Lab 04/07/20 0500 04/08/20 0520 04/09/20 0316 04/10/20 0357 04/11/20 0510  NA 136 140 140 141 141  K 5.0 4.5 3.6 3.1* 3.7  CL 103 109 110 110 109  CO2 24 20* 19* 21* 21*  GLUCOSE 97 84 114* 137* 125*  BUN 50* 53* 32* 30* 24*  CREATININE 2.38* 1.66* 0.92 0.89 0.89  CALCIUM 8.3* 8.1* 8.2* 7.8* 7.8*   GFR: Estimated Creatinine Clearance: 42.5 mL/min (by C-G formula based on SCr of 0.89 mg/dL). Liver Function  Tests: Recent Labs  Lab 04/06/20 1831 04/09/20 0316  AST 27 21  ALT 20 19  ALKPHOS 93 85  BILITOT 0.9 1.1  PROT 7.1 6.3*  ALBUMIN 3.4* 2.8*   Recent Labs  Lab 04/06/20 1831  LIPASE 17   No results for input(s): AMMONIA in the last 168 hours. Coagulation Profile: Recent Labs  Lab 04/07/20 1300  INR 1.3*   Cardiac Enzymes: No results for input(s): CKTOTAL, CKMB, CKMBINDEX, TROPONINI in the last 168 hours. BNP (last 3 results) Recent Labs    02/11/20 0936  PROBNP 228.0*   HbA1C: No results for input(s): HGBA1C in the last 72 hours. CBG: Recent Labs  Lab 04/08/20 1939  GLUCAP 108*   Lipid Profile: No results for input(s): CHOL, HDL, LDLCALC, TRIG, CHOLHDL, LDLDIRECT in the last 72 hours. Thyroid Function Tests: No results for input(s): TSH, T4TOTAL, FREET4, T3FREE, THYROIDAB in the last 72 hours. Anemia Panel: No results for input(s): VITAMINB12, FOLATE, FERRITIN, TIBC, IRON, RETICCTPCT in the last 72 hours. Sepsis Labs: Recent Labs  Lab 04/06/20 2006  LATICACIDVEN 1.3    Recent Results (from the past 240 hour(s))  Respiratory Panel by RT PCR (Flu A&B, Covid) - Nasopharyngeal Swab     Status: None   Collection Time: 04/06/20  6:37 PM   Specimen: Nasopharyngeal Swab  Result Value Ref Range Status   SARS Coronavirus 2 by RT PCR NEGATIVE NEGATIVE Final    Comment: (NOTE) SARS-CoV-2 target nucleic acids are NOT DETECTED. The SARS-CoV-2 RNA is generally detectable in upper respiratoy specimens during the acute phase of infection. The lowest concentration of SARS-CoV-2 viral copies this assay can detect is 131 copies/mL. A negative result does not preclude SARS-Cov-2 infection and should not be used as the sole basis for treatment or other patient management decisions. A negative result may occur with  improper specimen collection/handling, submission of specimen other than nasopharyngeal swab, presence of viral mutation(s) within the areas targeted by this  assay, and inadequate number of viral copies (<131 copies/mL). A negative result must be combined with clinical observations, patient history, and epidemiological information. The expected result is Negative. Fact Sheet for Patients:  https://www.moore.com/ Fact Sheet for Healthcare Providers:  https://www.young.biz/ This test is not yet ap proved or cleared by the [place] FDA and  has  been authorized for detection and/or diagnosis of SARS-CoV-2 by FDA under an Emergency Use Authorization (EUA). This EUA will remain  in effect (meaning this test can be used) for the duration of the COVID-19 declaration under Section 564(b)(1) of the Act, 21 U.S.C. section 360bbb-3(b)(1), unless the authorization is terminated or revoked sooner.    Influenza A by PCR NEGATIVE NEGATIVE Final   Influenza B by PCR NEGATIVE NEGATIVE Final    Comment: (NOTE) The Xpert Xpress SARS-CoV-2/FLU/RSV assay is intended as an aid in  the diagnosis of influenza from Nasopharyngeal swab specimens and  should not be used as a sole basis for treatment. Nasal washings and  aspirates are unacceptable for Xpert Xpress SARS-CoV-2/FLU/RSV  testing. Fact Sheet for Patients: PinkCheek.be Fact Sheet for Healthcare Providers: GravelBags.it This test is not yet approved or cleared by the [place] FDA and  has been authorized for detection and/or diagnosis of SARS-CoV-2 by  FDA under an Emergency Use Authorization (EUA). This EUA will remain  in effect (meaning this test can be used) for the duration of the  Covid-19 declaration under Section 564(b)(1) of the Act, 21  U.S.C. section 360bbb-3(b)(1), unless the authorization is  terminated or revoked. Performed at [facility], [place] [address] [place], [place] [address]   MRSA PCR Screening     Status: None   Collection Time: 04/07/20  4:02 PM    Specimen: Nasal Mucosa; Nasopharyngeal  Result Value Ref Range Status   MRSA by PCR NEGATIVE NEGATIVE Final    Comment:        The GeneXpert MRSA Assay (FDA approved for NASAL specimens only), is one component of a comprehensive MRSA colonization surveillance program. It is not intended to diagnose MRSA infection nor to guide or monitor treatment for MRSA infections. Performed at [facility], [place] [address] [place], [place] [address]   Culture, blood (routine x 2)     Status: None (Preliminary result)   Collection Time: 04/10/20  1:08 PM   Specimen: BLOOD LEFT HAND  Result Value Ref Range Status   Specimen Description   Final    BLOOD LEFT HAND Performed at [facility] [address], [place], [place] [address]    Special Requests   Final    BOTTLES DRAWN AEROBIC ONLY Blood Culture results may not be optimal due to an inadequate volume of blood received in culture bottles Performed at [facility] [address], [place], [place] [address]    Culture   Final    NO GROWTH < 24 HOURS Performed at [facility] [address] [place], [place] [address]    Report Status PENDING  Incomplete  Culture, blood (routine x 2)     Status: None (Preliminary result)   Collection Time: 04/10/20  1:08 PM   Specimen: BLOOD  Result Value Ref Range Status   Specimen Description   Final    BLOOD RIGHT ANTECUBITAL Performed at [facility] [address] [place], [place] [address]    Special Requests   Final    BOTTLES DRAWN AEROBIC AND ANAEROBIC Blood Culture adequate volume Performed at [facility] [address], [place], [place] [address]    Culture   Final    NO GROWTH < 24 HOURS Performed at [facility] [address], [place],  [address]    Report Status PENDING  Incomplete         Radiology  Studies: DG Chest 1 View  Result Date: 04/10/2020 CLINICAL  DATA:  Atrial fibrillation with shortness of breath. EXAM: CHEST  1 VIEW COMPARISON:  Apr 09, 2020 FINDINGS: Right pleural effusion again noted. There is apparent atelectatic change in the medial right base, stable. Lungs elsewhere are clear. Heart is mildly enlarged with pulmonary vascularity normal. There is stable aortic prominence with aortic atherosclerosis. No adenopathy. Chondroid matrix lesion in the proximal right humerus is stable. IMPRESSION: Persistent right pleural effusion an apparent medial right base atelectasis. Lungs elsewhere clear. Stable cardiac silhouette.  Aortic Atherosclerosis (ICD10-I70.0). Stable chondroid matrix lesion in the proximal right humerus, likely either enchondroma or bone infarct in etiology. Electronically Signed   By: Bretta BangWilliam  Woodruff III M.D.   On: 04/10/2020 13:46    Scheduled Meds: . apixaban  5 mg Oral BID  . atorvastatin  40 mg Oral Daily  . clopidogrel  75 mg Oral Daily  . feeding supplement (ENSURE ENLIVE)  237 mL Oral BID BM  . furosemide  40 mg Oral QODAY  . irbesartan  300 mg Oral Daily  . polyethylene glycol  17 g Oral BID   Continuous Infusions: . sodium chloride 250 mL (04/10/20 1350)  . ceFEPime (MAXIPIME) IV 2 g (04/11/20 0946)  . vancomycin 1,250 mg (04/11/20 0753)     LOS: 4 days    Time spent: 35 mins.More than 50% of that time was spent in counseling and/or coordination of care.    Burnadette PopAmrit Cristofher Livecchi, MD [facility] P5/10/2020, 12:47 PM

## 2020-04-11 NOTE — Progress Notes (Signed)
CT report reviewed. Showed perihepatic/subdiaphragmatic abscess. I have discussed the case with Dr. Lenise Arena surgery. I have added flagyl .Eliquis and plavix held.

## 2020-04-11 NOTE — Progress Notes (Signed)
   04/11/20 1256  Assess: MEWS Score  Temp 99.8 F (37.7 C)  BP 133/73  Pulse Rate 100  Resp (!) 34  SpO2 96 %  O2 Device Room Air  Assess: MEWS Score  MEWS Temp 0  MEWS Systolic 0  MEWS Pulse 0  MEWS RR 2  MEWS LOC 0  MEWS Score 2  MEWS Score Color Yellow  Assess: if the MEWS score is Yellow or Red  Were vital signs taken at a resting state? Yes  Focused Assessment Documented focused assessment  Early Detection of Sepsis Score *See Row Information* High  MEWS guidelines implemented *See Row Information* No, previously yellow, continue vital signs every 4 hours  Notify: Charge Nurse/RN  Name of Charge Nurse/RN Notified Megan Bullins  Date Charge Nurse/RN Notified 04/11/20  Time Charge Nurse/RN Notified 1300

## 2020-04-12 ENCOUNTER — Inpatient Hospital Stay (HOSPITAL_COMMUNITY): Payer: Medicare Other

## 2020-04-12 LAB — CBC WITH DIFFERENTIAL/PLATELET
Abs Immature Granulocytes: 0.31 10*3/uL — ABNORMAL HIGH (ref 0.00–0.07)
Basophils Absolute: 0.1 10*3/uL (ref 0.0–0.1)
Basophils Relative: 0 %
Eosinophils Absolute: 0.1 10*3/uL (ref 0.0–0.5)
Eosinophils Relative: 0 %
HCT: 28.3 % — ABNORMAL LOW (ref 36.0–46.0)
Hemoglobin: 9.3 g/dL — ABNORMAL LOW (ref 12.0–15.0)
Immature Granulocytes: 1 %
Lymphocytes Relative: 6 %
Lymphs Abs: 1.3 10*3/uL (ref 0.7–4.0)
MCH: 27.5 pg (ref 26.0–34.0)
MCHC: 32.9 g/dL (ref 30.0–36.0)
MCV: 83.7 fL (ref 80.0–100.0)
Monocytes Absolute: 1.2 10*3/uL — ABNORMAL HIGH (ref 0.1–1.0)
Monocytes Relative: 5 %
Neutro Abs: 20.4 10*3/uL — ABNORMAL HIGH (ref 1.7–7.7)
Neutrophils Relative %: 88 %
Platelets: 217 10*3/uL (ref 150–400)
RBC: 3.38 MIL/uL — ABNORMAL LOW (ref 3.87–5.11)
RDW: 14.6 % (ref 11.5–15.5)
WBC: 23.2 10*3/uL — ABNORMAL HIGH (ref 4.0–10.5)
nRBC: 0 % (ref 0.0–0.2)

## 2020-04-12 LAB — BASIC METABOLIC PANEL
Anion gap: 11 (ref 5–15)
BUN: 22 mg/dL (ref 8–23)
CO2: 22 mmol/L (ref 22–32)
Calcium: 7.3 mg/dL — ABNORMAL LOW (ref 8.9–10.3)
Chloride: 104 mmol/L (ref 98–111)
Creatinine, Ser: 0.72 mg/dL (ref 0.44–1.00)
GFR calc Af Amer: >60 mL/min (ref >60)
GFR calc non Af Amer: >60 mL/min (ref >60)
Glucose, Bld: 106 mg/dL — ABNORMAL HIGH (ref 70–99)
Potassium: 3.2 mmol/L — ABNORMAL LOW (ref 3.5–5.1)
Sodium: 137 mmol/L (ref 135–145)

## 2020-04-12 LAB — PH, BODY FLUID: pH, Body Fluid: 7.2

## 2020-04-12 LAB — GRAM STAIN

## 2020-04-12 LAB — APTT: aPTT: 43 seconds — ABNORMAL HIGH (ref 24–36)

## 2020-04-12 LAB — CYTOLOGY - NON PAP

## 2020-04-12 LAB — PROTIME-INR
INR: 1.8 — ABNORMAL HIGH (ref 0.8–1.2)
Prothrombin Time: 19.9 seconds — ABNORMAL HIGH (ref 11.4–15.2)

## 2020-04-12 MED ORDER — SODIUM CHLORIDE 0.9% FLUSH
5.0000 mL | Freq: Three times a day (TID) | INTRAVENOUS | Status: DC
  Administered 2020-04-13 – 2020-04-16 (×8): 5 mL

## 2020-04-12 MED ORDER — SODIUM CHLORIDE 0.9 % IV SOLN
100.0000 mg | INTRAVENOUS | Status: DC
  Administered 2020-04-13: 100 mg via INTRAVENOUS
  Filled 2020-04-12 (×2): qty 100

## 2020-04-12 MED ORDER — POTASSIUM CHLORIDE 10 MEQ/100ML IV SOLN
10.0000 meq | INTRAVENOUS | Status: AC
  Administered 2020-04-12 (×5): 10 meq via INTRAVENOUS
  Filled 2020-04-12 (×3): qty 100

## 2020-04-12 MED ORDER — FUROSEMIDE 20 MG PO TABS
20.0000 mg | ORAL_TABLET | Freq: Every day | ORAL | Status: DC
  Administered 2020-04-12 – 2020-04-17 (×6): 20 mg via ORAL
  Filled 2020-04-12 (×5): qty 1

## 2020-04-12 MED ORDER — LIDOCAINE HCL (PF) 1 % IJ SOLN
INTRAMUSCULAR | Status: AC | PRN
  Administered 2020-04-12: 10 mL

## 2020-04-12 MED ORDER — MIDAZOLAM HCL 2 MG/2ML IJ SOLN
INTRAMUSCULAR | Status: AC | PRN
  Administered 2020-04-12: 1 mg via INTRAVENOUS

## 2020-04-12 MED ORDER — FENTANYL CITRATE (PF) 100 MCG/2ML IJ SOLN
INTRAMUSCULAR | Status: AC
  Filled 2020-04-12: qty 4

## 2020-04-12 MED ORDER — CLOPIDOGREL BISULFATE 75 MG PO TABS
75.0000 mg | ORAL_TABLET | Freq: Every day | ORAL | Status: DC
  Administered 2020-04-13 – 2020-04-17 (×5): 75 mg via ORAL
  Filled 2020-04-12 (×5): qty 1

## 2020-04-12 MED ORDER — SODIUM CHLORIDE 0.9 % IV SOLN: 100.0000 mg | INTRAVENOUS | Status: DC

## 2020-04-12 MED ORDER — SODIUM CHLORIDE 0.9 % IV SOLN
200.0000 mg | Freq: Once | INTRAVENOUS | Status: AC
  Administered 2020-04-12: 200 mg via INTRAVENOUS
  Filled 2020-04-12: qty 200

## 2020-04-12 MED ORDER — PIPERACILLIN-TAZOBACTAM 3.375 G IVPB
3.3750 g | Freq: Three times a day (TID) | INTRAVENOUS | Status: DC
  Administered 2020-04-12 – 2020-04-17 (×15): 3.375 g via INTRAVENOUS
  Filled 2020-04-12 (×15): qty 50

## 2020-04-12 MED ORDER — APIXABAN 5 MG PO TABS
5.0000 mg | ORAL_TABLET | Freq: Two times a day (BID) | ORAL | Status: DC
  Administered 2020-04-13 – 2020-04-17 (×9): 5 mg via ORAL
  Filled 2020-04-12 (×9): qty 1

## 2020-04-12 MED ORDER — MIDAZOLAM HCL 2 MG/2ML IJ SOLN
INTRAMUSCULAR | Status: AC
  Filled 2020-04-12: qty 4

## 2020-04-12 MED ORDER — FENTANYL CITRATE (PF) 100 MCG/2ML IJ SOLN
INTRAMUSCULAR | Status: AC | PRN
  Administered 2020-04-12: 50 ug via INTRAVENOUS

## 2020-04-12 NOTE — Procedures (Signed)
Interventional Radiology Procedure:   Indications: Post operative perihepatic fluid collection with gas, history of cholecystectomy  Procedure: CT guided drain placement  Findings: Dark red fluid removed from perihepatic space.  Placed 10 Fr drain.  Removed greater than 100 ml.   Complications: None     EBL: less than 10 ml  Plan: Send fluid for culture.  Will need follow up CT with IV and oral contrast after drain output decreases because the perihepatic fluid may be loculated and there is concern for a separate collection along caudal aspect of cholecystectomy bed.     Cheryl Atkinson R. Lowella Dandy, MD  Pager: [phone]

## 2020-04-12 NOTE — [place] FL2 (Signed)
[place] MEDICAID FL2 LEVEL OF CARE SCREENING TOOL     IDENTIFICATION  Patient Name: Cheryl Atkinson Birthdate: 1931/05/25 Sex: female Admission Date (Current Location): 04/06/2020  [facility] and [place] Number:  Herbalist and Address:  [facility],  [place] [address] [place]      Provider Number: [id number]  Attending Physician Name and Address:  Shelly Coss, MD  Relative Name and Phone Number:  Thomasene, [place]ow [phone]  [phone] or Weyman Pedro Sister [phone]  [phone]    Current Level of Care: Hospital Recommended Level of Care: [place] Prior Approval Number:    Date Approved/Denied:   PASRR Number: [id number]  Discharge Plan: SNF    Current Diagnoses: Patient Active Problem List   Diagnosis Date Noted  . Encephalopathy 04/07/2020  . AKI (acute kidney injury) ([place]) 04/07/2020  . Acute respiratory failure with hypoxia ([place]) 04/07/2020  . Acute respiratory failure ([place]) 04/06/2020  . S/P laparoscopic cholecystectomy 04/04/2020  . CHF (congestive heart failure) ([place]) 03/01/2020  . Insomnia 03/01/2020  . Bacteremia 01/28/2020  . Acute cholangitis 01/25/2020  . Atrial fibrillation ([place]) 01/25/2020  . Acute cholecystitis 12/29/2019  . Adrenal nodule ([place]) 12/29/2019  . Abdominal aortic ectasia ([place]) 12/29/2019  . Major depression 03/25/2018  . Hypokalemia 12/21/2016  . Cat bite of right hand 12/21/2016  . BPPV (benign paroxysmal positional vertigo) 07/12/2016  . Memory loss 04/11/2016  . Mallet toe of right foot 10/20/2015  . Renal artery stenosis ([place]) 09/27/2015  . Hyperlipidemia 06/30/2015  . Claudication ([place]) 05/04/2015  . Atherosclerotic PVD with intermittent claudication ([place]) 04/26/2015  . Tinnitus 12/31/2014  . Former smoker 09/29/2014  . DNR (do not resuscitate) 09/29/2014  . Insulin resistance 07/19/2014  . Multinodular goiter 08/30/2013  . Spinal stenosis of lumbar region at  multiple levels 09/29/2012  . HIP PAIN, BILATERAL 07/16/2010  . COPD (chronic obstructive pulmonary disease) ([place]) 09/20/2009  . CONSTIPATION, CHRONIC 09/20/2009  . Iron deficiency anemia 11/09/2007  . History of UTI 11/09/2007  . RESTLESS LEG SYNDROME 09/25/2007  . Essential hypertension 09/25/2007  . GERD 09/25/2007  . LOW BACK PAIN 09/25/2007  . Osteoporosis 09/25/2007    Orientation RESPIRATION BLADDER Height & Weight     Self, Time  Normal Continent Weight: 142 lb 3.2 oz (64.5 kg) Height:  5\' 6"  (167.6 cm)  BEHAVIORAL SYMPTOMS/MOOD NEUROLOGICAL BOWEL NUTRITION STATUS      Incontinent Diet  AMBULATORY STATUS COMMUNICATION OF NEEDS Skin   Limited Assist Verbally Surgical wounds                       Personal Care Assistance Level of Assistance  Bathing, Dressing, Feeding Bathing Assistance: Limited assistance Feeding assistance: Limited assistance Dressing Assistance: Limited assistance     Functional Limitations Info  Sight, Speech, Hearing Sight Info: Adequate Hearing Info: Adequate Speech Info: Adequate    SPECIAL CARE FACTORS FREQUENCY  PT (By licensed PT), OT (By licensed OT), Speech therapy     PT Frequency: Minimum 5x a week OT Frequency: Minimum 5x a week     Speech Therapy Frequency: Minimum 2x a week      Contractures Contractures Info: Not present    Additional Factors Info  Code Status, Allergies Code Status Info: DNR Allergies Info: Codeine           Current Medications (04/12/2020):  This is the current hospital active medication list Current Facility-Administered Medications  Medication Dose Route Frequency Provider Last Rate  Last Admin  . 0.9 %  sodium chloride infusion   Intravenous PRN Burnadette Pop, MD 10 mL/hr at 04/10/20 1350 250 mL at 04/10/20 1350  . acetaminophen (TYLENOL) tablet 650 mg  650 mg Oral Q6H PRN John Giovanni, MD   650 mg at 04/11/20 2052   Or  . acetaminophen (TYLENOL) suppository 650 mg  650 mg Rectal  Q6H PRN John Giovanni, MD   650 mg at 04/11/20 1330  . anidulafungin (ERAXIS) 200 mg in sodium chloride 0.9 % 200 mL IVPB  200 mg Intravenous Once Winfield Rast, RPH 78 mL/hr at 04/12/20 1156 200 mg at 04/12/20 1156   Followed by  . [START ON 04/13/2020] anidulafungin (ERAXIS) 100 mg in sodium chloride 0.9 % 100 mL IVPB  100 mg Intravenous Q24H Shade, Jacqulyn Cane, RPH      . atorvastatin (LIPITOR) tablet 40 mg  40 mg Oral Daily John Giovanni, MD   40 mg at 04/12/20 1040  . bisacodyl (DULCOLAX) suppository 10 mg  10 mg Rectal Daily PRN Meuth, Brooke A, PA-C   10 mg at 04/10/20 1217  . feeding supplement (ENSURE ENLIVE) (ENSURE ENLIVE) liquid 237 mL  237 mL Oral BID BM Meuth, Brooke A, PA-C   237 mL at 04/11/20 0947  . fentaNYL (SUBLIMAZE) 100 MCG/2ML injection           . fentaNYL (SUBLIMAZE) injection   Intravenous PRN Richarda Overlie, MD   50 mcg at 04/12/20 1300  . furosemide (LASIX) tablet 20 mg  20 mg Oral Daily Burnadette Pop, MD   20 mg at 04/12/20 1034  . ipratropium-albuterol (DUONEB) 0.5-2.5 (3) MG/3ML nebulizer solution 3 mL  3 mL Nebulization Q6H PRN Adhikari, Amrit, MD      . irbesartan (AVAPRO) tablet 300 mg  300 mg Oral Daily Burnadette Pop, MD   300 mg at 04/12/20 1034  . labetalol (NORMODYNE) injection 10 mg  10 mg Intravenous Q2H PRN Burnadette Pop, MD   10 mg at 04/09/20 1153  . lidocaine (PF) (XYLOCAINE) 1 % injection    PRN Richarda Overlie, MD   10 mL at 04/12/20 1308  . midazolam (VERSED) 2 MG/2ML injection           . midazolam (VERSED) injection   Intravenous PRN Richarda Overlie, MD   1 mg at 04/12/20 1300  . naloxone (NARCAN) injection 0.4 mg  0.4 mg Intravenous PRN Burnadette Pop, MD   0.4 mg at 04/07/20 1454  . piperacillin-tazobactam (ZOSYN) IVPB 3.375 g  3.375 g Intravenous Trixie Deis, MD 12.5 mL/hr at 04/12/20 0758 3.375 g at 04/12/20 0758  . polyethylene glycol (MIRALAX / GLYCOLAX) packet 17 g  17 g Oral BID Burnadette Pop, MD   17 g at 04/10/20 2135  .  potassium chloride 10 mEq in 100 mL IVPB  10 mEq Intravenous Q1 Hr x 5 Adhikari, Amrit, MD 100 mL/hr at 04/12/20 1204 10 mEq at 04/12/20 1204     Discharge Medications: Please see discharge summary for a list of discharge medications.  Relevant Imaging Results:  Relevant Lab Results:   Additional Information SSN [id number]  Darleene Cleaver, LCSW

## 2020-04-12 NOTE — Progress Notes (Addendum)
Palliative Care consult received and chart reviewed in detail. Patient has an active MOST for indicating desire for DNR and Comfort Measures- she wants to avoid hospitalization or any aggressive medical interventions. She lives with her son and they have private duty in home care services. They do not want SNF placement. Given findings of intra-abdominal abscess I will contact son on 5/12 to clarify goals of care. MOST is completed by [facility] provider. Based on previously stated goals and depending on treatment decisions made in her current condition she would likely be eligible for hospice services at home. Complete recommendations to follow after discussion with patient and her son.  Anderson Malta, DO Palliative Medicine

## 2020-04-12 NOTE — Progress Notes (Signed)
Follow-up CT scan last night shows ascites more loculated with gas in right upper quadrant concerning for abscess.  Patient would benefit from percutaneous drainage of this abscess.  Blood drainage ordered.  Will defer to interventional allergy when they feel it is safe given the fact that she is on anticoagulation.  I would recommend switch to piperacillin/tazobactam for better intra-abdominal abscess coverage.  That should be sufficient.  Consider adding antifungal coverage for now given complexity and progression on antibacterial coverage  Ardeth Sportsman, MD, FACS, MASCRS Gastrointestinal and Minimally Invasive Surgery  [facility] [address], [address] [place], [place] [address] [phone] Fax [phone] Main/Paging  CONTACT INFORMATION: Weekday (9AM-5PM) concerns: Call [facility] main office at [phone] Weeknight (5PM-9AM) or Weekend/Holiday concerns: Check [email or web address] for General Surgery [facility] coverage (Please, do not use SecureChat as it is not reliable communication to surgeons for patient care)

## 2020-04-12 NOTE — Progress Notes (Signed)
Referring Physician(s): Gross,S  Supervising Physician: Markus Daft  Patient Status:  [facility] - In-pt  Chief Complaint: Perihepatic fluid collection   Subjective: Patient familiar to IR service from percutaneous cholecystostomy 12/30/2019, replacement on 02/24/2020 and exchange on 03/13/2020 as well as right thoracentesis yesterday. She is status post laparoscopic cholecystectomy on 04/04/2020.  Follow-up imaging yesterday revealed right perihepatic fluid collection with enhancing margins and increasing multiloculated gas concerning for possible abscess.  Request now received from surgery for image guided aspiration/drainage of this fluid collection.  Patient currently denies fever, headache, chest pain, worsening dyspnea, cough, abdominal pain, nausea, vomiting or bleeding. She does have some mild back pain.  Additional history as below.  Latest labs include WBC 23.2, hemoglobin 9.3, platelets 217k, PT 19.9, INR 1.8, creatinine 0.72, potassium 3.2.  Pleural fluid/blood cultures negative today.  COVID-19 negative.  Past Medical History:  Diagnosis Date  . Anemia   . Arthritis    "shoulders" (05/04/2015)  . Cellulitis of right lower extremity 11/27/2013  . CHF (congestive heart failure) ([place])   . Chronic lower back pain   . Constipation   . COPD (chronic obstructive pulmonary disease) ([place])   . GERD (gastroesophageal reflux disease)   . History of hiatal hernia   . HLD (hyperlipidemia)   . Hypertension   . Insomnia   . Osteoporosis   . Peripheral arterial disease ([place])   . Restless leg syndrome   . Thyroid disease    Past Surgical History:  Procedure Laterality Date  . ABDOMINAL AORTAGRAM  05/04/2015   Procedure: Abdominal Aortagram;  Surgeon: Lorretta Harp, MD;  Location: [place] [facility];  Service: Cardiovascular;;  . APPENDECTOMY    . CATARACT EXTRACTION W/ INTRAOCULAR LENS  IMPLANT, BILATERAL Bilateral   . CHOLECYSTECTOMY N/A 04/04/2020   Procedure: LAPAROSCOPIC  CHOLECYSTECTOMY;  Surgeon: Georganna Skeans, MD;  Location: [place];  Service: General;  Laterality: N/A;  . I & D EXTREMITY Right 12/22/2016   Procedure: IRRIGATION AND DEBRIDEMENT EXTREMITY;  Surgeon: Milly Jakob, MD;  Location: [place];  Service: Orthopedics;  Laterality: Right;  . IR EXCHANGE BILIARY DRAIN  03/13/2020  . IR PATIENT EVAL TECH 0-60 MINS  12/30/2019  . IR PERC CHOLECYSTOSTOMY  01/27/2020  . PERIPHERAL VASCULAR CATHETERIZATION N/A 05/04/2015   Procedure: Lower Extremity Angiography;  Surgeon: Lorretta Harp, MD;  Location: [place] [facility];  Service: Cardiovascular;  Laterality: N/A;  . PERIPHERAL VASCULAR CATHETERIZATION  05/04/2015   Procedure: Peripheral Vascular Intervention;  Surgeon: Lorretta Harp, MD;  Location: [place] [facility];  Service: Cardiovascular;;  RCIA - 7x22 ICAST  . PERIPHERAL VASCULAR CATHETERIZATION Right 09/04/2015   Procedure: Peripheral Vascular Atherectomy;  Surgeon: Lorretta Harp, MD;  Location: [place] [facility];  Service: Cardiovascular;  Laterality: Right;  SFA  . sfa Right 09/04/2015   de balloon  . THYROID SURGERY Right ?2013   "had goiter taken off my neck"  . TONSILLECTOMY    . VAGINAL HYSTERECTOMY         Allergies: Codeine  Medications: Prior to Admission medications   Medication Sig Start Date End Date Taking? Authorizing Provider  Albuterol Sulfate (PROAIR RESPICLICK) 767 (90 Base) MCG/ACT AEPB Inhale 2 puffs into the lungs every 6 (six) hours as needed (shortness of breath from COPD). 12/29/18  Yes Marin Olp, MD  apixaban (ELIQUIS) 5 MG TABS tablet Take 1 tablet (5 mg total) by mouth 2 (two) times daily. Start 04/06/20 04/05/20  Yes Georganna Skeans, MD  atorvastatin (LIPITOR) 40 MG tablet Take 1 tablet (40 mg total) by mouth daily. 03/16/20  Yes Shelva Majestic, MD  clopidogrel (PLAVIX) 75 MG tablet Take 1 tablet (75 mg total) by mouth daily. Start 04/06/20 04/05/20  Yes Violeta Gelinas, MD  furosemide (LASIX) 40 MG tablet Take  1 tablet (40 mg total) by mouth every other day. 04/06/20  Yes Shelva Majestic, MD  irbesartan (AVAPRO) 300 MG tablet Take 1 tablet (300 mg total) by mouth daily. 03/25/20  Yes Shelva Majestic, MD  Multiple Vitamins-Minerals (PRESERVISION AREDS 2 PO) Take 1 tablet by mouth 2 (two) times daily.   Yes [provider]  NEUPRO 3 MG/24HR PT24 PLACE 1 PATCH (3 MG) ONTO THE SKIN AT BEDTIME Patient taking differently: Apply 1 patch topically at bedtime. Take off in the morning 03/29/20  Yes Shelva Majestic, MD  polyethylene glycol (MIRALAX / GLYCOLAX) 17 g packet Take 17 g by mouth 2 (two) times daily. Reported on 03/14/2016 Patient taking differently: Take 17 g by mouth daily as needed for mild constipation.  01/03/20  Yes Hongalgi, Maximino Greenland, MD  potassium chloride SA (KLOR-CON) 20 MEQ tablet Take 1 tablet (20 mEq total) by mouth daily as needed (take on days that you take lasix). Patient taking differently: Take 20 mEq by mouth every other day.  03/25/20  Yes Shelva Majestic, MD  traZODone (DESYREL) 50 MG tablet Take 1.5 tablets (75 mg total) by mouth at bedtime as needed for sleep. 04/06/20  Yes Shelva Majestic, MD  sodium chloride flush (NS) 0.9 % SOLN 5 mLs by Intracatheter route every 8 (eight) hours. Patient not taking: Reported on 04/06/2020 01/03/20   Juliet Rude, PA-C     Vital Signs: BP (!) 151/68 (BP Location: Right Arm)   Pulse 71   Temp 98.6 F (37 C) (Oral)   Resp 18   Ht 5\' 6"  (1.676 m)   Wt 142 lb 3.2 oz (64.5 kg)   LMP  (LMP Unknown)   SpO2 100%   BMI 22.95 kg/m   Physical Exam awake, answers simple questions okay, chest with slightly diminished breath sounds right base, left clear.  Heart with normal rate, irregular rhythm.  Abdomen soft, positive bowel sounds, currently nontender.  No lower extremity edema.  Imaging: CT ABDOMEN PELVIS WO CONTRAST  Result Date: 04/08/2020 CLINICAL DATA:  Postop abdominal pain and fever. Recent cholecystectomy. EXAM: CT ABDOMEN AND  PELVIS WITHOUT CONTRAST TECHNIQUE: Multidetector CT imaging of the abdomen and pelvis was performed following the standard protocol without IV contrast. COMPARISON:  01/25/2020 FINDINGS: Lower chest: New small right pleural effusion. Dependent atelectasis/consolidation posteriorly in the right lower lobe. Coronary calcifications. Hepatobiliary: Surgical clips from cholecystectomy. Low attenuation material in the gallbladder fossa with scattered gas attenuation. No focal liver lesion. The CBD is prominent measuring up to 1.7 cm diameter, down to the ampulla level. Mild central intrahepatic biliary ductal dilatation. Pancreas: Mild diffuse atrophy without mass or ductal dilatation. Spleen: Normal in size without focal abnormality. Adrenals/Urinary Tract: Stable 1.9 cm left adrenal nodule. Kidneys unremarkable. Urinary bladder decompressed by Foley catheter. Stomach/Bowel: Stomach is decompressed. Small bowel is nondilated. Appendix surgically absent. Gas and moderate fecal material in the proximal, decompressed distally. Vascular/Lymphatic: Heavy aortoiliac arterial calcifications. Infrarenal aorta 2.7 cm diameter. Metallic stent in the right common iliac artery. No definite abdominal or pelvic adenopathy. Reproductive: Status post hysterectomy. No adnexal masses. Other: Small volume perihepatic ascites. No free air. Scattered gas bubbles in the abdominal  body wall presumably related to recent surgery. Musculoskeletal: Degenerative disc disease L5-S1. Old healed sternal fracture. No acute fracture or worrisome bone lesion. IMPRESSION: 1. Small volume perihepatic ascites post cholecystectomy. 2. Mild central intrahepatic biliary duct dilatation and distended CBD. Correlate with any clinical or laboratory evidence of biliary obstruction. 3. New small right pleural effusion with dependent atelectasis/consolidation in the right lower lobe. 4. Low-attenuation material in the gallbladder fossa with scattered gas  attenuation, possibly related to Surgicel from cholecystectomy. 5. Stable left adrenal nodule. Aortic Atherosclerosis (ICD10-I70.0). Electronically Signed   By: Corlis Leak  Hassell M.D.   On: 04/08/2020 12:24   DG Chest 1 View  Result Date: 04/10/2020 CLINICAL DATA:  Atrial fibrillation with shortness of breath. EXAM: CHEST  1 VIEW COMPARISON:  Apr 09, 2020 FINDINGS: Right pleural effusion again noted. There is apparent atelectatic change in the medial right base, stable. Lungs elsewhere are clear. Heart is mildly enlarged with pulmonary vascularity normal. There is stable aortic prominence with aortic atherosclerosis. No adenopathy. Chondroid matrix lesion in the proximal right humerus is stable. IMPRESSION: Persistent right pleural effusion an apparent medial right base atelectasis. Lungs elsewhere clear. Stable cardiac silhouette.  Aortic Atherosclerosis (ICD10-I70.0). Stable chondroid matrix lesion in the proximal right humerus, likely either enchondroma or bone infarct in etiology. Electronically Signed   By: Bretta BangWilliam  Woodruff III M.D.   On: 04/10/2020 13:46   DG Chest 1 View  Result Date: 04/09/2020 CLINICAL DATA:  Shortness of breath EXAM: CHEST  1 VIEW COMPARISON:  Apr 06, 2020 FINDINGS: The patient's mandible obscures much of the right apex. There is a small right pleural effusion. There is atelectatic change in the right mid lung and right base regions. The visualized lungs elsewhere clear. Heart is mildly enlarged with pulmonary vascularity normal. Aorta is prominent with aortic atherosclerosis, stable. There is a chondroid matrix lesion in the proximal right humerus, most likely either an enchondroma or bone infarct. IMPRESSION: Right apex obscured by patient's mandible. Areas of mild atelectasis on the right with small right pleural effusion. Lungs elsewhere clear. Stable cardiac silhouette. Aortic Atherosclerosis (ICD10-I70.0). Stable chondroid matrix lesion in proximal right humerus. Electronically Signed    By: Bretta BangWilliam  Woodruff III M.D.   On: 04/09/2020 11:22   CT CHEST W CONTRAST  Result Date: 04/11/2020 CLINICAL DATA:  Pleural effusion, respiratory failure and COPD. EXAM: CT CHEST WITH CONTRAST TECHNIQUE: Multidetector CT imaging of the chest was performed during intravenous contrast administration. CONTRAST:  75mL OMNIPAQUE IOHEXOL 300 MG/ML  SOLN COMPARISON:  Chest radiograph 04/10/2020 FINDINGS: Despite efforts by the technologist and patient, motion artifact is present on today's exam and could not be eliminated. This reduces exam sensitivity and specificity. Cardiovascular: Coronary, aortic arch, and branch vessel atherosclerotic vascular disease. Mild cardiomegaly. Mediastinum/Nodes: Right thyroid lobe enlargement and nodularity. This was biopsied on 09/15/2013. This has been documented on prior studies (ref: J Am Coll Radiol. 2015 Feb;12(2): 143-50).Lower paratracheal lymph node 0.9 cm in short axis on image 57/3. Right internal mammary lymph node versus venous varix measuring 0.7 cm in short axis diameter on image 89/3. Lungs/Pleura: The right pleural effusion has resolved status post thoracentesis. Volume loss and atelectasis in the right lower lobe and to a lesser extent in the right middle lobe. Mild biapical pleuroparenchymal scarring. Hazy peripheral sub solid density in the left upper lobe peripherally for example on image 26/8, nonspecific but possibly reflecting local alveolitis. Upper Abdomen: Perihepatic ascites with abnormal free intraperitoneal fluid within the right upper quadrant ascites. By report  the patient had a laparoscopic cholecystectomy with laparoscopic lysis of adhesions on 04/04/2020. However, the amount of right upper quadrant gas in the perihepatic fluid appears increased compared to the interval CT abdomen from 04/08/2020. Also, there is concavity of the right anterior contour of the liver on image 119/3 which was not present previously. Fullness of the left adrenal gland,  not completely included on today's exam. Musculoskeletal: Thoracic kyphosis. Probable enchondroma in the right proximal humeral metaphysis. There is a small amount of gas tracking along the right chest wall, possibly procedure related. Old bilateral rib fractures and healing bilateral rib fractures are present. Old sternal body deformity from a prior fracture. IMPRESSION: 1. Right perihepatic fluid collection with enhancing margins is now indenting the liver margin, concerning for infected fluid collection/abscess. Also there is increasing multilocular gas within this fluid collection, concerning for subdiaphragmatic/perihepatic abscess. 2. The right pleural effusion has resolved status post thoracentesis. 3. Volume loss and atelectasis in the right lower lobe and to a lesser extent in the right middle lobe. 4. Hazy peripheral sub solid density in the left upper lobe peripherally, nonspecific but possibly reflecting local alveolitis. 5. Coronary, aortic arch, and branch vessel atherosclerotic vascular disease. Mild cardiomegaly. 6. Perihepatic ascites with abnormal free intraperitoneal fluid within the right upper quadrant ascites. 7. Old bilateral rib fractures and healing bilateral rib fractures. 8. Stable fullness of the left adrenal gland, not completely included on today's exam. 9. Probable enchondroma in the right proximal humeral metaphysis. 10. Thoracic kyphosis. 11. Aortic atherosclerosis. Aortic Atherosclerosis (ICD10-I70.0). Electronically Signed   By: Gaylyn Rong M.D.   On: 04/11/2020 18:40   US THORACENTESIS ASP PLEURAL SPACE W/IMG GUIDE  Result Date: 04/11/2020 INDICATION: Respiratory failure. COPD. Right-sided pleural effusion. Request for diagnostic and therapeutic thoracentesis. EXAM: ULTRASOUND GUIDED RIGHT THORACENTESIS MEDICATIONS: None. COMPLICATIONS: None immediate. PROCEDURE: An ultrasound guided thoracentesis was thoroughly discussed with the patient and questions answered. The  benefits, risks, alternatives and complications were also discussed. The patient understands and wishes to proceed with the procedure. Written consent was obtained. Ultrasound was performed to localize and mark an adequate pocket of fluid in the right chest. The area was then prepped and draped in the normal sterile fashion. 1% Lidocaine was used for local anesthesia. Under ultrasound guidance a 6 Fr Safe-T-Centesis catheter was introduced. Thoracentesis was performed. The catheter was removed and a dressing applied. FINDINGS: A total of approximately 650 mL of hazy yellow fluid was removed. Samples were sent to the laboratory as requested by the clinical team. IMPRESSION: Successful ultrasound guided right thoracentesis yielding 650 mL of pleural fluid. Read by: Brayton El PA-C Electronically Signed   By: Simonne Come M.D.   On: 04/11/2020 15:07    Labs:  CBC: Recent Labs    04/09/20 0316 04/10/20 0357 04/11/20 0510 04/12/20 0452  WBC 12.8* 14.6* 16.9* 23.2*  HGB 9.3* 8.4* 8.9* 9.3*  HCT 30.0* 26.4* 27.5* 28.3*  PLT 223 210 222 217    COAGS: Recent Labs    01/25/20 0100 01/25/20 0100 04/04/20 0847 04/07/20 1300 04/07/20 2301 04/08/20 0520 04/08/20 1701 04/09/20 0316 04/12/20 0732  INR 1.4*  --  1.1 1.3*  --   --   --   --  1.8*  APTT 28   < >  --  41*   < > 54* 65* 89* 43*   < > = values in this interval not displayed.    BMP: Recent Labs    04/09/20 0316 04/10/20 0357 04/11/20 0510  04/12/20 0452  NA 140 141 141 137  K 3.6 3.1* 3.7 3.2*  CL 110 110 109 104  CO2 19* 21* 21* 22  GLUCOSE 114* 137* 125* 106*  BUN 32* 30* 24* 22  CALCIUM 8.2* 7.8* 7.8* 7.3*  CREATININE 0.92 0.89 0.89 0.72  GFRNONAA 56* 59* 59* >60  GFRAA >60 >60 >60 >60    LIVER FUNCTION TESTS: Recent Labs    03/15/20 1038 04/04/20 0847 04/06/20 1831 04/09/20 0316  BILITOT 0.7 0.5 0.9 1.1  AST 13 16 27 21   ALT 9 14 20 19   ALKPHOS 99 87 93 85  PROT 7.1 7.4 7.1 6.3*  ALBUMIN 4.2 3.8 3.4*  2.8*    Assessment and Plan: 86 year old female with history of atrial fibrillation, diastolic heart failure, COPD, GERD, hypertension, hyperlipidemia, PVD, on Plavix/Eliquis who was recently admitted for cholecystitis and underwent prior cholecystostomy as well as eventual cholecystectomy on 04/04/2020.  Recently readmitted due to lethargy and confusion along with intermittent fevers/ leukocytosis and AKI.  Recent imaging reveals right perihepatic fluid collection with increasing multilocular gas concerning for possible abscess.  Request received from surgery for image guided aspiration/drainage of this fluid collection.  Imaging studies have been reviewed by Dr. 88.  Details/risks of procedure, including but not limited to, internal bleeding, infection, injury to adjacent structures discussed with patient with her understanding and consent.  Procedure scheduled for today.   Electronically Signed: D. 06/04/2020, PA-C 04/12/2020, 9:15 AM   I spent a total of 25 minutes at the the patient's bedside AND on the patient's hospital floor or unit, greater than 50% of which was counseling/coordinating care for image guided aspiration/possible drainage of perihepatic fluid collection    Patient ID: AQUILLA SHAMBLEY, female   DOB: 1931/03/18, 86 y.o.   MRN: [id number]

## 2020-04-12 NOTE — Progress Notes (Signed)
CC: Lethargy/confusion  Subjective: Patient really does not have any pain in her abdomen.  I told her she had a fluid collection and they were going to try and drain it.  She said she has had drains for months.  Port sites from prior cholecystectomy all look fine and are healing nicely.  Objective: Vital signs in last 24 hours: Temp:  [98.6 F (37 C)-101.3 F (38.5 C)] 98.6 F (37 C) (05/12 0547) Pulse Rate:  [58-100] 71 (05/12 0547) Resp:  [18-34] 18 (05/12 0547) BP: (127-151)/(53-75) 151/68 (05/12 0547) SpO2:  [93 %-100 %] 100 % (05/11 2048) Last BM Date: 04/12/20 720 p.o. 300 IV Voided x7 BM x4 T-max 101 rectal yesterday at 11 and 1500 hrs. Vital signs are stable pH 7.52, PCO2 28.8, PO2 91, bicarb 23.4 K+ 3.2 WBC 23.2 INR 1.8 PTT 43 Chest CT 5/11: Right perihepatic collection with enhancing margins now indenting the liver concerning for infected fluid collection/abscess; multilocular gas within the fluid collection.  Right pleural effusion resolved s/p thoracentesis perihepatic ascites with abnormal free intraperitoneal fluid right upper quadrant ascites.  Old bilateral rib fractures healing probable endochondroma right proximal humeral metaphysis Intake/Output from previous day: 05/11 0701 - 05/12 0700 In: 1020 [P.O.:720; IV Piggyback:300] Out: -  Intake/Output this shift: No intake/output data recorded.  General appearance: alert, cooperative, no distress and Elderly frail appearing woman. Resp: clear to auscultation bilaterally GI: soft, non-tender; bowel sounds normal; no masses,  no organomegaly and Port sites all look fine are healing nicely.  Lab Results:  Recent Labs    04/11/20 0510 04/12/20 0452  WBC 16.9* 23.2*  HGB 8.9* 9.3*  HCT 27.5* 28.3*  PLT 222 217    BMET Recent Labs    04/11/20 0510 04/12/20 0452  NA 141 137  K 3.7 3.2*  CL 109 104  CO2 21* 22  GLUCOSE 125* 106*  BUN 24* 22  CREATININE 0.89 0.72  CALCIUM 7.8* 7.3*    PT/INR Recent Labs    04/12/20 0732  LABPROT 19.9*  INR 1.8*    Recent Labs  Lab 04/06/20 1831 04/09/20 0316  AST 27 21  ALT 20 19  ALKPHOS 93 85  BILITOT 0.9 1.1  PROT 7.1 6.3*  ALBUMIN 3.4* 2.8*     Lipase     Component Value Date/Time   LIPASE 17 04/06/2020 1831     Medications: . atorvastatin  40 mg Oral Daily  . feeding supplement (ENSURE ENLIVE)  237 mL Oral BID BM  . furosemide  20 mg Oral Daily  . irbesartan  300 mg Oral Daily  . polyethylene glycol  17 g Oral BID   Anti-infectives (From admission, onward)   Start     Dose/Rate Route Frequency Ordered Stop   04/13/20 1000  anidulafungin (ERAXIS) 100 mg in sodium chloride 0.9 % 100 mL IVPB     100 mg 78 mL/hr over 100 Minutes Intravenous Every 24 hours 04/12/20 0734     04/12/20 1000  anidulafungin (ERAXIS) 200 mg in sodium chloride 0.9 % 200 mL IVPB     200 mg 78 mL/hr over 200 Minutes Intravenous  Once 04/12/20 0734     04/12/20 0815  anidulafungin (ERAXIS) 100 mg in sodium chloride 0.9 % 100 mL IVPB  Status:  Discontinued     100 mg 78 mL/hr over 100 Minutes Intravenous Every 24 hours 04/12/20 0721 04/12/20 0734   04/12/20 0800  piperacillin-tazobactam (ZOSYN) IVPB 3.375 g     3.375 g  12.5 mL/hr over 240 Minutes Intravenous Every 8 hours 04/12/20 0718     04/11/20 2000  metroNIDAZOLE (FLAGYL) IVPB 500 mg  Status:  Discontinued     500 mg 100 mL/hr over 60 Minutes Intravenous Every 8 hours 04/11/20 1859 04/12/20 0718   04/11/20 0800  vancomycin (VANCOREADY) IVPB 1250 mg/250 mL     1,250 mg 166.7 mL/hr over 90 Minutes Intravenous Every 24 hours 04/10/20 1602     04/10/20 2200  ceFEPIme (MAXIPIME) 2 g in sodium chloride 0.9 % 100 mL IVPB  Status:  Discontinued     2 g 200 mL/hr over 30 Minutes Intravenous Every 12 hours 04/10/20 1552 04/12/20 0718   04/10/20 1400  vancomycin (VANCOCIN) IVPB 1000 mg/200 mL premix     1,000 mg 200 mL/hr over 60 Minutes Intravenous  Once 04/10/20 1256 04/10/20 1451    04/10/20 1400  piperacillin-tazobactam (ZOSYN) IVPB 3.375 g     3.375 g 100 mL/hr over 30 Minutes Intravenous  Once 04/10/20 1256 04/10/20 1417     . sodium chloride 250 mL (04/10/20 1350)  . anidulafungin     Followed by  . [START ON 04/13/2020] anidulafungin    . piperacillin-tazobactam (ZOSYN)  IV 3.375 g (04/12/20 0758)  . potassium chloride    . vancomycin 1,250 mg (04/12/20 0800)   Assessment/Plan HTN HLD A fib on eliquis (LD5/11) PVD on plavix (LD5/11) COPD  AKI - resolved Acute hypoxic hypercarbic respiratory failure - improving, off Bipap, suspect respiratory depression secondary to tramadol overuse  S/p CT-guided cholecystostomy tube placement 12/30/2019, Dr. Markus Daft S/p laparoscopic cholecystectomy 04/04/20 Dr. Grandville Silos POD #8 - CT 5/8 without surgical complications, LFTs WNL  - CT chest 5/11: Right perihepatic fluid collection with enhancing margins now indenting the liver margins concerning for infected fluid/abscess/perihepatic abscess.  -IR evaluation for drain placement 5/12  -WBC 12.8>> 14.6>> 16.9>> 23.2   ID -Maxipime 5/11-5/12; flagyl 5/11-5/12; Zosyn 5/12 >>   VTE -SCDs, eliquis/plavix FEN - NPO/IV fluids Foley -none Follow up -Dr. Grandville Silos   Plan: Patient's been switched over to Zosyn per Dr. Clyda Greener recommendation and IR plans to evaluate and hopefully place a drain today.  LOS: 5 days    Arlie Posch 04/12/2020 Please see Amion

## 2020-04-12 NOTE — Progress Notes (Signed)
SLP Cancellation Note  Patient Details Name: Cheryl Atkinson MRN: [id number] DOB: [birth year]   Cancelled treatment:       Reason Eval/Treat Not Completed: Medical issues which prohibited therapy   Unable to complete BSE at this time - Pt is currently NPO for IR procedure today to place a drain. RN aware. Will continue efforts.  Karianne Nogueira B. Murvin Natal, [facility], CCC-SLP Speech Language Pathologist Office: [phone] Pager: [phone]  Leigh Aurora 04/12/2020, 10:49 AM

## 2020-04-12 NOTE — TOC Progression Note (Signed)
Transition of Care [facility]) - Progression Note    Patient Details  Name: Cheryl Atkinson MRN: [id number] Date of Birth: 1931/04/27  [facility] CM/SW Contact  Darleene Cleaver, [place] Phone Number: 04/12/2020, 4:22 PM  Clinical Narrative:     CSW spoke with patient and her son via phone, patient chose [facility] for SNF placement.  CSW contacted [facility] and they can accept her once she is medically ready for discharge.   Expected Discharge Plan: Home w Home Health Services Barriers to Discharge: Continued Medical Work up  Expected Discharge Plan and Services Expected Discharge Plan: Home w Home Health Services     Post Acute Care Choice: Home Health Living arrangements for the past 2 months: Single Family Home Expected Discharge Date: (unknown)                 DME Agency: NA       HH Arranged: PT, OT, Nurse's Aide, RN, Social Work, Refused SNF           Social Determinants of Health (SDOH) Interventions    Readmission Risk Interventions No flowsheet data found.

## 2020-04-12 NOTE — Progress Notes (Signed)
PROGRESS NOTE    Cheryl Atkinson  BEE:[id number] DOB: 1931/05/30 DOA: 04/06/2020 PCP: Shelva Majestic, MD   Brief Narrative: Patient is a 86 year old female with history of persistent A. fib, chronic diastolic CHF, COPD, GERD, hypertension, hyperlipidemia, peripheral vascular disease who was recently admitted here for cholecystitis and underwent laparoscopic cholecystectomy.  She was discharged from here on 04/05/2020 and prescribed tramadol for pain.  Patient was brought back to the emerge department by family because of concern of lethargy, confusion.  He saturated 87% on room air, had to be placed on supplemental oxygen and eventually on BiPAP due to decreased mentation, somnolence.  ABG showed hypercarbia.  She was also febrile on presentation with leukocytosis.  Found to have AKI.  Chest x-ray did not show any acute abnormality.  Also appeared very dehydrated on presentation. She had to be started on Narcan as needed for opioid toxicity and was given IV fluids. Overall condition improved.  Currently she is maintaining her saturation on room air but had episodes of tachypnea.  Chest x-ray done on 04/10/2020 showed right pleural effusion,underwent thoracentesis with removal of 650 ml of fluid. Hospital course remarkable for persistent fever, worsening leukocytosis.  CT chest with contrast showed perihepatic/subdiaphragmatic abscess.  Antibiotics coverage broadened.  General surgery following again.  IR consulted for abscess drainage. PT has recommended skilled nursing facility.  Assessment & Plan:   Principal Problem:   Acute respiratory failure (HCC) Active Problems:   COPD (chronic obstructive pulmonary disease) (HCC)   S/P laparoscopic cholecystectomy   Encephalopathy   AKI (acute kidney injury) (HCC)   Acute respiratory failure with hypoxia (HCC)   Acute hypoxic hypercarbic respiratory failure: On presentation.Most likely secondary to respiratory depression secondary to tramadol  overuse.  She was just discharged from here after cholecystectomy and was prescribed tramadol for pain.  Chest x-ray does not show any acute findings.  She was hypoxic on room air and had to be put on BiPAP on admission.  ABG showed hypercarbia with CO2 of 68.  Mentation  improved after she was  placed on BiPAP and with  Narcan. Hold any opiates or sedating medications.   CT head did not show any acute intracranial abnormalities. Mental and respiratory status improved and she has been maintaining her saturation on room air.  Off BiPAP. Her home Lasix has been restarted.Chest x-ray done on 04/10/2020 showed right pleural effusion.  Underwent  ultrasound-guided thoracentesis of the right chest.  Perihepatic/subdiaphragmatic abscess:She had persistent fever, worsening leukocytosis.  CT chest with contrast showed perihepatic/subdiaphragmatic abscess.  Antibiotics coverage broadened.  General surgery following again.  IR consulted for abscess drainage. Follow-up cultures.  AKI: Resolved with IV fluids.  Renal ultrasound did not show any acute findings.Foley was placed on admission which has been discontinued.  Acute cholecystitis status post laparoscopic cholecystectomy on 04/04/2020: Presented with significant leukocytosis, fever of 100 F.  Noted hypotension on arrival, lactic cidosis.  General surgery following.  No abdominal pain.  General surgery did not suspect any acute intra-abdominal findings initially.  CT abdomen/pelvis done here did not show complication of recent surgery.But CT chest showed perihepatic/subdiapshragmatic  abscess.   Permanent A. fib: Currently rate is controlled.  On Eliquis for anticoagulation.  Not on any rate control agents.  Eliquis on hold today because of possible intervention by IR.  Will resume after the procedure  Hypokalemia: Being supplemented.  Peripheral vascular disease: On Plavix, Lipitor.  Plavix on hold for possible intervention by IR. will resume after the  procedure  COPD: Currently stable.  No signs of acute exacerbation.  Continue bronchodilators as needed.  Hypertension: On ARB at home which is which has been resumed.  Continue to monitor.   Debility/deconditioning: PT recommended skilled nursing facility.SW consulted and following.        DVT prophylaxis: Eliquis Code Status: DNR Family Communication: Called and discussed with  Son on phone on 04/11/20 Status is: inaptient   Dispo: The patient is from: Home              Anticipated d/c is to:SNF              Anticipated d/c date is:2-3 days              Patient currently is not medically stable to d/c.   Needs IR guided drainage for perihepatic abscess.  Consultants: Surgery  Procedures:None  Antimicrobials:  Anti-infectives (From admission, onward)   Start     Dose/Rate Route Frequency Ordered Stop   04/13/20 1000  anidulafungin (ERAXIS) 100 mg in sodium chloride 0.9 % 100 mL IVPB     100 mg 78 mL/hr over 100 Minutes Intravenous Every 24 hours 04/12/20 0734     04/12/20 1000  anidulafungin (ERAXIS) 200 mg in sodium chloride 0.9 % 200 mL IVPB     200 mg 78 mL/hr over 200 Minutes Intravenous  Once 04/12/20 0734     04/12/20 0815  anidulafungin (ERAXIS) 100 mg in sodium chloride 0.9 % 100 mL IVPB  Status:  Discontinued     100 mg 78 mL/hr over 100 Minutes Intravenous Every 24 hours 04/12/20 0721 04/12/20 0734   04/12/20 0800  piperacillin-tazobactam (ZOSYN) IVPB 3.375 g     3.375 g 12.5 mL/hr over 240 Minutes Intravenous Every 8 hours 04/12/20 0718     04/11/20 2000  metroNIDAZOLE (FLAGYL) IVPB 500 mg  Status:  Discontinued     500 mg 100 mL/hr over 60 Minutes Intravenous Every 8 hours 04/11/20 1859 04/12/20 0718   04/11/20 0800  vancomycin (VANCOREADY) IVPB 1250 mg/250 mL     1,250 mg 166.7 mL/hr over 90 Minutes Intravenous Every 24 hours 04/10/20 1602     04/10/20 2200  ceFEPIme (MAXIPIME) 2 g in sodium chloride 0.9 % 100 mL IVPB  Status:  Discontinued     2 g 200  mL/hr over 30 Minutes Intravenous Every 12 hours 04/10/20 1552 04/12/20 0718   04/10/20 1400  vancomycin (VANCOCIN) IVPB 1000 mg/200 mL premix     1,000 mg 200 mL/hr over 60 Minutes Intravenous  Once 04/10/20 1256 04/10/20 1451   04/10/20 1400  piperacillin-tazobactam (ZOSYN) IVPB 3.375 g     3.375 g 100 mL/hr over 30 Minutes Intravenous  Once 04/10/20 1256 04/10/20 1417      Subjective:  Patient seen and examined at the bedside this morning.  Hemodynamically stable.  She does not complain of any abdomen pain, nausea or vomiting.  Breathing is better today. Discussed about the importance of putting a drain for abdominal abscess and she agrees.  Objective: Vitals:   04/11/20 1711 04/11/20 1827 04/11/20 2048 04/12/20 0547  BP:  136/65 (!) 127/53 (!) 151/68  Pulse:  83 (!) 58 71  Resp:  (!) 32 18 18  Temp: 100.3 F (37.9 C) (!) 101.1 F (38.4 C) 99.2 F (37.3 C) 98.6 F (37 C)  TempSrc: Rectal Rectal Oral Oral  SpO2:  95% 100%   Weight:      Height:        Intake/Output  Summary (Last 24 hours) at 04/12/2020 0745 Last data filed at 04/12/2020 0600 Gross per 24 hour  Intake 1020 ml  Output --  Net 1020 ml   Filed Weights   04/07/20 1621  Weight: 64.5 kg    Examination:  General exam: Elderly debilitated female, comfortable  Respiratory system: Diminished air sound on the right side  cardiovascular system: Irregularly irregular rhythm. No JVD, murmurs, rubs, gallops or clicks. Gastrointestinal system: Abdomen is nondistended, soft and has very mild tenderness on the right subcostal region. No organomegaly or masses felt. Normal bowel sounds heard. Central nervous system: Alert and oriented. No focal neurological deficits. Extremities: No edema, no clubbing ,no cyanosis, distal peripheral pulses palpable. Skin: No rashes, lesions or ulcers,no icterus ,no pallor   Data Reviewed: I have personally reviewed following labs and imaging studies  CBC: Recent Labs  Lab  04/08/20 0520 04/09/20 0316 04/10/20 0357 04/11/20 0510 04/12/20 0452  WBC 12.9* 12.8* 14.6* 16.9* 23.2*  NEUTROABS 11.0* 10.9* 12.4* 14.1* 20.4*  HGB 9.3* 9.3* 8.4* 8.9* 9.3*  HCT 30.3* 30.0* 26.4* 27.5* 28.3*  MCV 92.7 89.6 86.3 85.4 83.7  PLT 206 223 210 222 217   Basic Metabolic Panel: Recent Labs  Lab 04/08/20 0520 04/09/20 0316 04/10/20 0357 04/11/20 0510 04/12/20 0452  NA 140 140 141 141 137  K 4.5 3.6 3.1* 3.7 3.2*  CL 109 110 110 109 104  CO2 20* 19* 21* 21* 22  GLUCOSE 84 114* 137* 125* 106*  BUN 53* 32* 30* 24* 22  CREATININE 1.66* 0.92 0.89 0.89 0.72  CALCIUM 8.1* 8.2* 7.8* 7.8* 7.3*   GFR: Estimated Creatinine Clearance: 47.3 mL/min (by C-G formula based on SCr of 0.72 mg/dL). Liver Function Tests: Recent Labs  Lab 04/06/20 1831 04/09/20 0316  AST 27 21  ALT 20 19  ALKPHOS 93 85  BILITOT 0.9 1.1  PROT 7.1 6.3*  ALBUMIN 3.4* 2.8*   Recent Labs  Lab 04/06/20 1831  LIPASE 17   No results for input(s): AMMONIA in the last 168 hours. Coagulation Profile: Recent Labs  Lab 04/07/20 1300  INR 1.3*   Cardiac Enzymes: No results for input(s): CKTOTAL, CKMB, CKMBINDEX, TROPONINI in the last 168 hours. BNP (last 3 results) Recent Labs    02/11/20 0936  PROBNP 228.0*   HbA1C: No results for input(s): HGBA1C in the last 72 hours. CBG: Recent Labs  Lab 04/08/20 1939  GLUCAP 108*   Lipid Profile: No results for input(s): CHOL, HDL, LDLCALC, TRIG, CHOLHDL, LDLDIRECT in the last 72 hours. Thyroid Function Tests: No results for input(s): TSH, T4TOTAL, FREET4, T3FREE, THYROIDAB in the last 72 hours. Anemia Panel: No results for input(s): VITAMINB12, FOLATE, FERRITIN, TIBC, IRON, RETICCTPCT in the last 72 hours. Sepsis Labs: Recent Labs  Lab 04/06/20 2006  LATICACIDVEN 1.3    Recent Results (from the past 240 hour(s))  Respiratory Panel by RT PCR (Flu A&B, Covid) - Nasopharyngeal Swab     Status: None   Collection Time: 04/06/20  6:37 PM     Specimen: Nasopharyngeal Swab  Result Value Ref Range Status   SARS Coronavirus 2 by RT PCR NEGATIVE NEGATIVE Final    Comment: (NOTE) SARS-CoV-2 target nucleic acids are NOT DETECTED. The SARS-CoV-2 RNA is generally detectable in upper respiratoy specimens during the acute phase of infection. The lowest concentration of SARS-CoV-2 viral copies this assay can detect is 131 copies/mL. A negative result does not preclude SARS-Cov-2 infection and should not be used as the sole basis for  treatment or other patient management decisions. A negative result may occur with  improper specimen collection/handling, submission of specimen other than nasopharyngeal swab, presence of viral mutation(s) within the areas targeted by this assay, and inadequate number of viral copies (<131 copies/mL). A negative result must be combined with clinical observations, patient history, and epidemiological information. The expected result is Negative. Fact Sheet for Patients:  https://www.moore.com/ Fact Sheet for Healthcare Providers:  https://www.young.biz/ This test is not yet ap proved or cleared by the [place] FDA and  has been authorized for detection and/or diagnosis of SARS-CoV-2 by FDA under an Emergency Use Authorization (EUA). This EUA will remain  in effect (meaning this test can be used) for the duration of the COVID-19 declaration under Section 564(b)(1) of the Act, 21 U.S.C. section 360bbb-3(b)(1), unless the authorization is terminated or revoked sooner.    Influenza A by PCR NEGATIVE NEGATIVE Final   Influenza B by PCR NEGATIVE NEGATIVE Final    Comment: (NOTE) The Xpert Xpress SARS-CoV-2/FLU/RSV assay is intended as an aid in  the diagnosis of influenza from Nasopharyngeal swab specimens and  should not be used as a sole basis for treatment. Nasal washings and  aspirates are unacceptable for Xpert Xpress SARS-CoV-2/FLU/RSV  testing. Fact  Sheet for Patients: https://www.moore.com/ Fact Sheet for Healthcare Providers: https://www.young.biz/ This test is not yet approved or cleared by the [place] FDA and  has been authorized for detection and/or diagnosis of SARS-CoV-2 by  FDA under an Emergency Use Authorization (EUA). This EUA will remain  in effect (meaning this test can be used) for the duration of the  Covid-19 declaration under Section 564(b)(1) of the Act, 21  U.S.C. section 360bbb-3(b)(1), unless the authorization is  terminated or revoked. Performed at [facility], [address] [place], [place] [address]   MRSA PCR Screening     Status: None   Collection Time: 04/07/20  4:02 PM   Specimen: Nasal Mucosa; Nasopharyngeal  Result Value Ref Range Status   MRSA by PCR NEGATIVE NEGATIVE Final    Comment:        The GeneXpert MRSA Assay (FDA approved for NASAL specimens only), is one component of a comprehensive MRSA colonization surveillance program. It is not intended to diagnose MRSA infection nor to guide or monitor treatment for MRSA infections. Performed at [facility], [address] [place], [place] [address]   Culture, blood (routine x 2)     Status: None (Preliminary result)   Collection Time: 04/10/20  1:08 PM   Specimen: BLOOD LEFT HAND  Result Value Ref Range Status   Specimen Description   Final    BLOOD LEFT HAND Performed at [facility], [address], [place], [place] [address]    Special Requests   Final    BOTTLES DRAWN AEROBIC ONLY Blood Culture results may not be optimal due to an inadequate volume of blood received in culture bottles Performed at [facility], [address] [place], [place] [address]    Culture   Final    NO GROWTH 2 DAYS Performed at [facility], [address], [place], [place] [address]    Report Status PENDING  Incomplete   Culture, blood (routine x 2)     Status: None (Preliminary result)   Collection Time: 04/10/20  1:08 PM   Specimen: BLOOD  Result Value Ref Range Status   Specimen Description   Final    BLOOD RIGHT ANTECUBITAL  Performed at [facility], [address] [place], [place] [address]    Special Requests   Final    BOTTLES DRAWN AEROBIC AND ANAEROBIC Blood Culture adequate volume Performed at [facility], [address], [place], [place] [address]    Culture   Final    NO GROWTH 2 DAYS Performed at [facility], [address] [place], [place] [address]    Report Status PENDING  Incomplete  Culture, body fluid-bottle     Status: None (Preliminary result)   Collection Time: 04/11/20  3:08 PM   Specimen: Fluid  Result Value Ref Range Status   Specimen Description FLUID PLEURAL  Final   Special Requests BOTTLES DRAWN AEROBIC AND ANAEROBIC  Final   Culture   Final    NO GROWTH < 12 HOURS Performed at [facility], [address], [place], [place] [address]    Report Status PENDING  Incomplete  Gram stain     Status: None   Collection Time: 04/11/20  3:08 PM   Specimen: Fluid  Result Value Ref Range Status   Specimen Description FLUID PLEURAL  Final   Special Requests NONE  Final   Gram Stain   Final    WBC PRESENT,BOTH PMN AND MONONUCLEAR NO ORGANISMS SEEN CYTOSPIN SMEAR Performed at [facility], [address], [place], [place] [address]    Report Status 04/11/2020 FINAL  Final         Radiology Studies: DG Chest 1 View  Result Date: 04/10/2020 CLINICAL DATA:  Atrial fibrillation with shortness of breath. EXAM: CHEST  1 VIEW COMPARISON:  Apr 09, 2020 FINDINGS: Right pleural effusion again noted. There is apparent atelectatic change in the medial right base, stable. Lungs elsewhere are clear. Heart is mildly enlarged with pulmonary vascularity normal. There is stable aortic prominence with aortic atherosclerosis. No  adenopathy. Chondroid matrix lesion in the proximal right humerus is stable. IMPRESSION: Persistent right pleural effusion an apparent medial right base atelectasis. Lungs elsewhere clear. Stable cardiac silhouette.  Aortic Atherosclerosis (ICD10-I70.0). Stable chondroid matrix lesion in the proximal right humerus, likely either enchondroma or bone infarct in etiology. Electronically Signed   By: Bretta Bang III M.D.   On: 04/10/2020 13:46   CT CHEST W CONTRAST  Result Date: 04/11/2020 CLINICAL DATA:  Pleural effusion, respiratory failure and COPD. EXAM: CT CHEST WITH CONTRAST TECHNIQUE: Multidetector CT imaging of the chest was performed during intravenous contrast administration. CONTRAST:  74mL OMNIPAQUE IOHEXOL 300 MG/ML  SOLN COMPARISON:  Chest radiograph 04/10/2020 FINDINGS: Despite efforts by the technologist and patient, motion artifact is present on today's exam and could not be eliminated. This reduces exam sensitivity and specificity. Cardiovascular: Coronary, aortic arch, and branch vessel atherosclerotic vascular disease. Mild cardiomegaly. Mediastinum/Nodes: Right thyroid lobe enlargement and nodularity. This was biopsied on 09/15/2013. This has been documented on prior studies (ref: J Am Coll Radiol. 2015 Feb;12(2): 143-50).Lower paratracheal lymph node 0.9 cm in short axis on image 57/3. Right internal mammary lymph node versus venous varix measuring 0.7 cm in short axis diameter on image 89/3. Lungs/Pleura: The right pleural effusion has resolved status post thoracentesis. Volume loss and atelectasis in the right lower lobe and to a lesser extent in the right middle lobe. Mild biapical pleuroparenchymal scarring. Hazy peripheral sub solid density in the left upper lobe peripherally for example on image 26/8, nonspecific but possibly reflecting local alveolitis. Upper Abdomen: Perihepatic ascites with abnormal free intraperitoneal fluid within the  right upper quadrant ascites. By report the  patient had a laparoscopic cholecystectomy with laparoscopic lysis of adhesions on 04/04/2020. However, the amount of right upper quadrant gas in the perihepatic fluid appears increased compared to the interval CT abdomen from 04/08/2020. Also, there is concavity of the right anterior contour of the liver on image 119/3 which was not present previously. Fullness of the left adrenal gland, not completely included on today's exam. Musculoskeletal: Thoracic kyphosis. Probable enchondroma in the right proximal humeral metaphysis. There is a small amount of gas tracking along the right chest wall, possibly procedure related. Old bilateral rib fractures and healing bilateral rib fractures are present. Old sternal body deformity from a prior fracture. IMPRESSION: 1. Right perihepatic fluid collection with enhancing margins is now indenting the liver margin, concerning for infected fluid collection/abscess. Also there is increasing multilocular gas within this fluid collection, concerning for subdiaphragmatic/perihepatic abscess. 2. The right pleural effusion has resolved status post thoracentesis. 3. Volume loss and atelectasis in the right lower lobe and to a lesser extent in the right middle lobe. 4. Hazy peripheral sub solid density in the left upper lobe peripherally, nonspecific but possibly reflecting local alveolitis. 5. Coronary, aortic arch, and branch vessel atherosclerotic vascular disease. Mild cardiomegaly. 6. Perihepatic ascites with abnormal free intraperitoneal fluid within the right upper quadrant ascites. 7. Old bilateral rib fractures and healing bilateral rib fractures. 8. Stable fullness of the left adrenal gland, not completely included on today's exam. 9. Probable enchondroma in the right proximal humeral metaphysis. 10. Thoracic kyphosis. 11. Aortic atherosclerosis. Aortic Atherosclerosis (ICD10-I70.0). Electronically Signed   By: Van Clines M.D.   On: 04/11/2020 18:40   US THORACENTESIS  ASP PLEURAL SPACE W/IMG GUIDE  Result Date: 04/11/2020 INDICATION: Respiratory failure. COPD. Right-sided pleural effusion. Request for diagnostic and therapeutic thoracentesis. EXAM: ULTRASOUND GUIDED RIGHT THORACENTESIS MEDICATIONS: None. COMPLICATIONS: None immediate. PROCEDURE: An ultrasound guided thoracentesis was thoroughly discussed with the patient and questions answered. The benefits, risks, alternatives and complications were also discussed. The patient understands and wishes to proceed with the procedure. Written consent was obtained. Ultrasound was performed to localize and mark an adequate pocket of fluid in the right chest. The area was then prepped and draped in the normal sterile fashion. 1% Lidocaine was used for local anesthesia. Under ultrasound guidance a 6 Fr Safe-T-Centesis catheter was introduced. Thoracentesis was performed. The catheter was removed and a dressing applied. FINDINGS: A total of approximately 650 mL of hazy yellow fluid was removed. Samples were sent to the laboratory as requested by the clinical team. IMPRESSION: Successful ultrasound guided right thoracentesis yielding 650 mL of pleural fluid. Read by: Ascencion Dike PA-C Electronically Signed   By: Sandi Mariscal M.D.   On: 04/11/2020 15:07    Scheduled Meds: . atorvastatin  40 mg Oral Daily  . feeding supplement (ENSURE ENLIVE)  237 mL Oral BID BM  . furosemide  40 mg Oral Daily  . irbesartan  300 mg Oral Daily  . polyethylene glycol  17 g Oral BID   Continuous Infusions: . sodium chloride 250 mL (04/10/20 1350)  . anidulafungin     Followed by  . [START ON 04/13/2020] anidulafungin    . piperacillin-tazobactam (ZOSYN)  IV    . vancomycin 1,250 mg (04/11/20 0753)     LOS: 5 days    Time spent: 35 mins.More than 50% of that time was spent in counseling and/or coordination of care.    Shelly Coss, MD [facility] P5/11/2020, 7:45 AM

## 2020-04-13 ENCOUNTER — Encounter (HOSPITAL_COMMUNITY): Payer: Self-pay | Admitting: Internal Medicine

## 2020-04-13 DIAGNOSIS — K91870 Postprocedural hematoma of a digestive system organ or structure following a digestive system procedure: Secondary | ICD-10-CM

## 2020-04-13 DIAGNOSIS — J9601 Acute respiratory failure with hypoxia: Secondary | ICD-10-CM

## 2020-04-13 LAB — CBC WITH DIFFERENTIAL/PLATELET
Abs Immature Granulocytes: 0.22 10*3/uL — ABNORMAL HIGH (ref 0.00–0.07)
Basophils Absolute: 0.1 10*3/uL (ref 0.0–0.1)
Basophils Relative: 0 %
Eosinophils Absolute: 0 10*3/uL (ref 0.0–0.5)
Eosinophils Relative: 0 %
HCT: 26 % — ABNORMAL LOW (ref 36.0–46.0)
Hemoglobin: 8.2 g/dL — ABNORMAL LOW (ref 12.0–15.0)
Immature Granulocytes: 1 %
Lymphocytes Relative: 6 %
Lymphs Abs: 1.1 10*3/uL (ref 0.7–4.0)
MCH: 27.3 pg (ref 26.0–34.0)
MCHC: 31.5 g/dL (ref 30.0–36.0)
MCV: 86.7 fL (ref 80.0–100.0)
Monocytes Absolute: 1.1 10*3/uL — ABNORMAL HIGH (ref 0.1–1.0)
Monocytes Relative: 6 %
Neutro Abs: 16.7 10*3/uL — ABNORMAL HIGH (ref 1.7–7.7)
Neutrophils Relative %: 87 %
Platelets: 257 10*3/uL (ref 150–400)
RBC: 3 MIL/uL — ABNORMAL LOW (ref 3.87–5.11)
RDW: 14.9 % (ref 11.5–15.5)
WBC: 19.2 10*3/uL — ABNORMAL HIGH (ref 4.0–10.5)
nRBC: 0 % (ref 0.0–0.2)

## 2020-04-13 LAB — TYPE AND SCREEN
ABO/RH(D): O POS
Antibody Screen: NEGATIVE

## 2020-04-13 LAB — BASIC METABOLIC PANEL
Anion gap: 10 (ref 5–15)
BUN: 21 mg/dL (ref 8–23)
CO2: 22 mmol/L (ref 22–32)
Calcium: 7.4 mg/dL — ABNORMAL LOW (ref 8.9–10.3)
Chloride: 105 mmol/L (ref 98–111)
Creatinine, Ser: 0.82 mg/dL (ref 0.44–1.00)
GFR calc Af Amer: >60 mL/min (ref >60)
GFR calc non Af Amer: >60 mL/min (ref >60)
Glucose, Bld: 94 mg/dL (ref 70–99)
Potassium: 3 mmol/L — ABNORMAL LOW (ref 3.5–5.1)
Sodium: 137 mmol/L (ref 135–145)

## 2020-04-13 LAB — ABO/RH: ABO/RH(D): O POS

## 2020-04-13 NOTE — Progress Notes (Addendum)
Physical Therapy Treatment Patient Details Name: Cheryl Atkinson MRN: [id number] DOB: 1931/05/03 Today's Date: 04/13/2020    History of Present Illness Patient is an 86 year old female with a history of atrial fibrillation, hypertension, COPD, CHF, prediabetes and recent Lap chole.  Pt admitted through ED with acute respiratory failure likely 2* Tramadol overuse.    PT Comments    Mod assist for sidelying to sit. Pt sat edge of bed for 12 minutes with kyphotic posture, improved sitting balance today noted. +2 max assist for sit to stand x 2 trials, +2 max assist to pivot to recliner. Activity tolerance limited by fatigue. Pt puts forth good effort.    Follow Up Recommendations  SNF     Equipment Recommendations  None recommended by PT    Recommendations for Other Services       Precautions / Restrictions Precautions Precautions: Fall Restrictions Weight Bearing Restrictions: No    Mobility  Bed Mobility Overal bed mobility: Needs Assistance Bed Mobility: Rolling;Sidelying to Sit Rolling: +2 for safety/equipment;Min assist Sidelying to sit: Mod assist       General bed mobility comments: min A to initiate roll, mod A to raise trunk  Transfers Overall transfer level: Needs assistance Equipment used: Rolling walker (2 wheeled);None Transfers: Sit to/from [employer] Sit to Stand: max assist;+2 physical assistance;+2 safety/equipment;From elevated surface Stand pivot transfers: Max assist;+2 safety/equipment       General transfer comment: pt unable to stand with walker despite + 2 side by side assist. Sit to stand with +2 max assist for periccare, then  So +2 side by side max A for stand pivot sit from bed to recliner  Ambulation/Gait                 Stairs             Wheelchair Mobility    Modified Rankin (Stroke Patients Only)       Balance Overall balance assessment: Needs assistance Sitting-balance support: Feet  supported;Single extremity supported Sitting balance-Leahy Scale: Fair Sitting balance - Comments: able to maintain neutral, sat edge of bed ~12 minutes, min posterior lean when she did knee extension AROM but able to self correct; performed forward reaching x 2, unable to reach foot to don sock   Standing balance support: Bilateral upper extremity supported Standing balance-Leahy Scale: Zero Standing balance comment: posterior lean                            Cognition Arousal/Alertness: Awake/alert Behavior During Therapy: WFL for tasks assessed/performed Overall Cognitive Status: Within Functional Limits for tasks assessed                                        Exercises General Exercises - Lower Extremity Long Arc Quad: AROM;Both;5 reps;Seated    General Comments        Pertinent Vitals/Pain Pain Assessment: No/denies pain    Home Living                      Prior Function            PT Goals (current goals can now be found in the care plan section) Acute Rehab PT Goals Patient Stated Goal: Regain IND PT Goal Formulation: With patient Time For Goal Achievement: 04/23/20 Potential to Achieve Goals: Fair Progress towards PT  goals: Progressing toward goals    Frequency    Min 2X/week      PT Plan Current plan remains appropriate    Co-evaluation              AM-PAC PT "6 Clicks" Mobility   Outcome Measure  Help needed turning from your back to your side while in a flat bed without using bedrails?: A Little Help needed moving from lying on your back to sitting on the side of a flat bed without using bedrails?: A Lot Help needed moving to and from a bed to a chair (including a wheelchair)?: Total Help needed standing up from a chair using your arms (e.g., wheelchair or bedside chair)?: A Lot Help needed to walk in hospital room?: Total Help needed climbing 3-5 steps with a railing? : Total 6 Click Score: 10     End of Session Equipment Utilized During Treatment: Gait belt Activity Tolerance: Patient limited by fatigue Patient left: in chair;with call bell/phone within reach Nurse Communication: Mobility status PT Visit Diagnosis: Muscle weakness (generalized) (M62.81);Difficulty in walking, not elsewhere classified (R26.2)     Time: 3329-5188 PT Time Calculation (min) (ACUTE ONLY): 23 min  Charges:  $Therapeutic Activity: 23-37 mins                    [name] PT 04/13/2020  Acute Rehabilitation Services Pager [phone] Office [phone]

## 2020-04-13 NOTE — Progress Notes (Signed)
PROGRESS NOTE    Cheryl Atkinson  PFX:[id number] DOB: 18-Jul-1931 DOA: 04/06/2020 PCP: Marin Olp, MD   Brief Narrative: Patient is a 86 year old female with history of persistent A. fib, chronic diastolic CHF, COPD, GERD, hypertension, hyperlipidemia, peripheral vascular disease who was recently admitted here for cholecystitis and underwent laparoscopic cholecystectomy.  She was discharged from here on 04/05/2020 and prescribed tramadol for pain.  Patient was brought back to the emerge department by family because of concern of lethargy, confusion.  He saturated 87% on room air, had to be placed on supplemental oxygen and eventually on BiPAP due to decreased mentation, somnolence.  ABG showed hypercarbia.  She was also febrile on presentation with leukocytosis.  Found to have AKI.  Chest x-ray did not show any acute abnormality.  Also appeared very dehydrated on presentation. She had to be started on Narcan as needed for opioid toxicity and was given IV fluids. Overall condition improved.  Currently she is maintaining her saturation on room air but had episodes of tachypnea.  Chest x-ray done on 04/10/2020 showed right pleural effusion,underwent thoracentesis with removal of 650 ml of fluid. Hospital course remarkable for persistent fever, worsening leukocytosis.  CT chest with contrast showed perihepatic/subdiaphragmatic abscess.  Antibiotics coverage broadened.  General surgery following again.  IR abscess drainage 5/12, on Zosyn. PT has recommended skilled nursing facility.  Assessment & Plan:   Principal Problem:   Acute respiratory failure (HCC) Active Problems:   COPD (chronic obstructive pulmonary disease) (HCC)   S/P laparoscopic cholecystectomy   Encephalopathy   AKI (acute kidney injury) ([facility])   Acute respiratory failure with hypoxia (HCC)   Perihepatic/subdiaphragmatic abscess:She had persistent fever, worsening leukocytosis.  CT chest with contrast showed  perihepatic/subdiaphragmatic abscess.  Antibiotics coverage broadened.  General surgery following again.  IR consulted for abscess drainage done 5/12 and abx broadened to Zosyn. IR recommends repeat CT with contrast after drainage tapers off, to check for further loculations, etc. Follow cx data, culture obtained 5/12.  Acute hypoxic hypercarbic respiratory failure: On presentation.Most likely secondary to respiratory depression secondary to tramadol overuse.  She was just discharged from here after cholecystectomy and was prescribed tramadol for pain.  Chest x-ray does not show any acute findings.  She was hypoxic on room air and had to be put on BiPAP on admission.  ABG showed hypercarbia with CO2 of 68.  Mentation  improved after she was  placed on BiPAP and with  Narcan. Hold any opiates or sedating medications.   CT head did not show any acute intracranial abnormalities. Mental and respiratory status improved and she has been maintaining her saturation on room air.  Off BiPAP. Her home Lasix has been restarted.Chest x-ray done on 04/10/2020 showed right pleural effusion.  Underwent  ultrasound-guided thoracentesis of the right chest.  AKI: Resolved with IV fluids.  Renal ultrasound did not show any acute findings.Foley was placed on admission which has been discontinued.  Acute cholecystitis status post laparoscopic cholecystectomy on 04/04/2020: Presented with significant leukocytosis, fever of 100 F.  Noted hypotension on arrival, lactic cidosis.  General surgery following.  No abdominal pain.  General surgery did not suspect any acute intra-abdominal findings initially.  CT abdomen/pelvis done here did not show complication of recent surgery.But CT chest showed perihepatic/subdiapshragmatic  abscess.   Permanent A. fib: Currently rate is controlled.  On Eliquis for anticoagulation.  Not on any rate control agents.  Eliquis was held for drain placement and has been resumed.  Hypokalemia: Being  supplemented.  Peripheral vascular disease: On Plavix, Lipitor.    COPD: Currently stable.  No signs of acute exacerbation.  Continue bronchodilators as needed.  Hypertension: On ARB at home which is which has been resumed.  Continue to monitor.   Debility/deconditioning: PT recommended skilled nursing facility.SW consulted and following.     DVT prophylaxis: Eliquis Code Status: DNR Family Communication: Called and discussed with  Son on phone on 04/11/20 Status is: inaptient  Dispo: The patient is from: Home              Anticipated d/c is to:SNF              Anticipated d/c date is:2-3 days              Patient currently is not medically stable to d/c.   Needs IR guided drainage for perihepatic abscess.  Consultants: Surgery  Procedures:None  Antimicrobials:  Anti-infectives (From admission, onward)   Start     Dose/Rate Route Frequency Ordered Stop   04/13/20 1000  anidulafungin (ERAXIS) 100 mg in sodium chloride 0.9 % 100 mL IVPB     100 mg 78 mL/hr over 100 Minutes Intravenous Every 24 hours 04/12/20 0734     04/12/20 1000  anidulafungin (ERAXIS) 200 mg in sodium chloride 0.9 % 200 mL IVPB     200 mg 78 mL/hr over 200 Minutes Intravenous  Once 04/12/20 0734 04/12/20 1516   04/12/20 0815  anidulafungin (ERAXIS) 100 mg in sodium chloride 0.9 % 100 mL IVPB  Status:  Discontinued     100 mg 78 mL/hr over 100 Minutes Intravenous Every 24 hours 04/12/20 0721 04/12/20 0734   04/12/20 0800  piperacillin-tazobactam (ZOSYN) IVPB 3.375 g     3.375 g 12.5 mL/hr over 240 Minutes Intravenous Every 8 hours 04/12/20 0718     04/11/20 2000  metroNIDAZOLE (FLAGYL) IVPB 500 mg  Status:  Discontinued     500 mg 100 mL/hr over 60 Minutes Intravenous Every 8 hours 04/11/20 1859 04/12/20 0718   04/11/20 0800  vancomycin (VANCOREADY) IVPB 1250 mg/250 mL  Status:  Discontinued     1,250 mg 166.7 mL/hr over 90 Minutes Intravenous Every 24 hours 04/10/20 1602 04/12/20 1048   04/10/20 2200   ceFEPIme (MAXIPIME) 2 g in sodium chloride 0.9 % 100 mL IVPB  Status:  Discontinued     2 g 200 mL/hr over 30 Minutes Intravenous Every 12 hours 04/10/20 1552 04/12/20 0718   04/10/20 1400  vancomycin (VANCOCIN) IVPB 1000 mg/200 mL premix     1,000 mg 200 mL/hr over 60 Minutes Intravenous  Once 04/10/20 1256 04/10/20 1451   04/10/20 1400  piperacillin-tazobactam (ZOSYN) IVPB 3.375 g     3.375 g 100 mL/hr over 30 Minutes Intravenous  Once 04/10/20 1256 04/10/20 1417      Subjective:  Patient seen and examined at the bedside this morning.  Hemodynamically stable, has no complaints. Says drain doesn't hurt as long as it is not touched.  Objective: Vitals:   04/12/20 1407 04/12/20 1807 04/12/20 2042 04/13/20 0549  BP: (!) 144/80 (!) 143/69 125/61 (!) 151/80  Pulse: 95 81 62 (!) 56  Resp: (!) 27 (!) Temp: 99.9 F (37.7 C) 97.6 F (36.4 C) 98.1 F (36.7 C) (!) 97.4 F (36.3 C)  TempSrc: Oral Oral Oral Oral  SpO2: 96% 99% 95% 99%  Weight:      Height:        Intake/Output Summary (Last 24 hours)  at 04/13/2020 0813 Last data filed at 04/13/2020 0753 Gross per 24 hour  Intake 1227.52 ml  Output 365 ml  Net 862.52 ml   Filed Weights   04/07/20 1621  Weight: 64.5 kg    Examination:  General exam: Elderly debilitated female, comfortable  Respiratory system: Diminished air sound on the right side , R chest JP drain with scant bloody material (was just emptied per patient) cardiovascular system: Irregularly irregular rhythm. No JVD, murmurs, rubs, gallops or clicks. Gastrointestinal system: Abdomen is nondistended, soft and has very mild tenderness on the right subcostal region. No organomegaly or masses felt. Normal bowel sounds heard. Central nervous system: Alert and oriented. No focal neurological deficits. Extremities: No edema, no clubbing ,no cyanosis, distal peripheral pulses palpable. Skin: No rashes, lesions or ulcers,no icterus ,no pallor   Data Reviewed:  I have personally reviewed following labs and imaging studies  CBC: Recent Labs  Lab 04/09/20 0316 04/10/20 0357 04/11/20 0510 04/12/20 0452 04/13/20 0510  WBC 12.8* 14.6* 16.9* 23.2* 19.2*  NEUTROABS 10.9* 12.4* 14.1* 20.4* 16.7*  HGB 9.3* 8.4* 8.9* 9.3* 8.2*  HCT 30.0* 26.4* 27.5* 28.3* 26.0*  MCV 89.6 86.3 85.4 83.7 86.7  PLT 223 210 222 217 257   Basic Metabolic Panel: Recent Labs  Lab 04/09/20 0316 04/10/20 0357 04/11/20 0510 04/12/20 0452 04/13/20 0510  NA 140 141 141 137 137  K 3.6 3.1* 3.7 3.2* 3.0*  CL 110 110 109 104 105  CO2 19* 21* 21* 22 22  GLUCOSE 114* 137* 125* 106* 94  BUN 32* 30* 24* 22 21  CREATININE 0.92 0.89 0.89 0.72 0.82  CALCIUM 8.2* 7.8* 7.8* 7.3* 7.4*   GFR: Estimated Creatinine Clearance: 46.1 mL/min (by C-G formula based on SCr of 0.82 mg/dL). Liver Function Tests: Recent Labs  Lab 04/06/20 1831 04/09/20 0316  AST 27 21  ALT 20 19  ALKPHOS 93 85  BILITOT 0.9 1.1  PROT 7.1 6.3*  ALBUMIN 3.4* 2.8*   Recent Labs  Lab 04/06/20 1831  LIPASE 17   No results for input(s): AMMONIA in the last 168 hours. Coagulation Profile: Recent Labs  Lab 04/07/20 1300 04/12/20 0732  INR 1.3* 1.8*   Cardiac Enzymes: No results for input(s): CKTOTAL, CKMB, CKMBINDEX, TROPONINI in the last 168 hours. BNP (last 3 results) Recent Labs    02/11/20 0936  PROBNP 228.0*   HbA1C: No results for input(s): HGBA1C in the last 72 hours. CBG: Recent Labs  Lab 04/08/20 1939  GLUCAP 108*   Lipid Profile: No results for input(s): CHOL, HDL, LDLCALC, TRIG, CHOLHDL, LDLDIRECT in the last 72 hours. Thyroid Function Tests: No results for input(s): TSH, T4TOTAL, FREET4, T3FREE, THYROIDAB in the last 72 hours. Anemia Panel: No results for input(s): VITAMINB12, FOLATE, FERRITIN, TIBC, IRON, RETICCTPCT in the last 72 hours. Sepsis Labs: Recent Labs  Lab 04/06/20 2006  LATICACIDVEN 1.3    Recent Results (from the past 240 hour(s))  Respiratory  Panel by RT PCR (Flu A&B, Covid) - Nasopharyngeal Swab     Status: None   Collection Time: 04/06/20  6:37 PM   Specimen: Nasopharyngeal Swab  Result Value Ref Range Status   SARS Coronavirus 2 by RT PCR NEGATIVE NEGATIVE Final    Comment: (NOTE) SARS-CoV-2 target nucleic acids are NOT DETECTED. The SARS-CoV-2 RNA is generally detectable in upper respiratoy specimens during the acute phase of infection. The lowest concentration of SARS-CoV-2 viral copies this assay can detect is 131 copies/mL. A negative result does not preclude  SARS-Cov-2 infection and should not be used as the sole basis for treatment or other patient management decisions. A negative result may occur with  improper specimen collection/handling, submission of specimen other than nasopharyngeal swab, presence of viral mutation(s) within the areas targeted by this assay, and inadequate number of viral copies (<131 copies/mL). A negative result must be combined with clinical observations, patient history, and epidemiological information. The expected result is Negative. Fact Sheet for Patients:  https://www.moore.com/ Fact Sheet for Healthcare Providers:  https://www.young.biz/ This test is not yet ap proved or cleared by the [place] FDA and  has been authorized for detection and/or diagnosis of SARS-CoV-2 by FDA under an Emergency Use Authorization (EUA). This EUA will remain  in effect (meaning this test can be used) for the duration of the COVID-19 declaration under Section 564(b)(1) of the Act, 21 U.S.C. section 360bbb-3(b)(1), unless the authorization is terminated or revoked sooner.    Influenza A by PCR NEGATIVE NEGATIVE Final   Influenza B by PCR NEGATIVE NEGATIVE Final    Comment: (NOTE) The Xpert Xpress SARS-CoV-2/FLU/RSV assay is intended as an aid in  the diagnosis of influenza from Nasopharyngeal swab specimens and  should not be used as a sole basis for  treatment. Nasal washings and  aspirates are unacceptable for Xpert Xpress SARS-CoV-2/FLU/RSV  testing. Fact Sheet for Patients: https://www.moore.com/ Fact Sheet for Healthcare Providers: https://www.young.biz/ This test is not yet approved or cleared by the [place] FDA and  has been authorized for detection and/or diagnosis of SARS-CoV-2 by  FDA under an Emergency Use Authorization (EUA). This EUA will remain  in effect (meaning this test can be used) for the duration of the  Covid-19 declaration under Section 564(b)(1) of the Act, 21  U.S.C. section 360bbb-3(b)(1), unless the authorization is  terminated or revoked. Performed at [facility], [address], [place], [place] [address]   MRSA PCR Screening     Status: None   Collection Time: 04/07/20  4:02 PM   Specimen: Nasal Mucosa; Nasopharyngeal  Result Value Ref Range Status   MRSA by PCR NEGATIVE NEGATIVE Final    Comment:        The GeneXpert MRSA Assay (FDA approved for NASAL specimens only), is one component of a comprehensive MRSA colonization surveillance program. It is not intended to diagnose MRSA infection nor to guide or monitor treatment for MRSA infections. Performed at [facility], [address], [place], [place] [address]   Culture, blood (routine x 2)     Status: None (Preliminary result)   Collection Time: 04/10/20  1:08 PM   Specimen: BLOOD LEFT HAND  Result Value Ref Range Status   Specimen Description   Final    BLOOD LEFT HAND Performed at [facility], [address], [place], [place] [address]    Special Requests   Final    BOTTLES DRAWN AEROBIC ONLY Blood Culture results may not be optimal due to an inadequate volume of blood received in culture bottles Performed at [facility], [address] [place], [place] [address]    Culture   Final    NO GROWTH 2 DAYS Performed  at [facility], [address] [place], [place] [address]    Report Status PENDING  Incomplete  Culture, blood (routine x 2)     Status: None (Preliminary result)   Collection Time: 04/10/20  1:08 PM   Specimen: BLOOD  Result Value Ref Range Status  Specimen Description   Final    BLOOD RIGHT ANTECUBITAL Performed at [facility], [address] [place], [place] [address]    Special Requests   Final    BOTTLES DRAWN AEROBIC AND ANAEROBIC Blood Culture adequate volume Performed at [facility], [address] [place], [place] [address]    Culture   Final    NO GROWTH 2 DAYS Performed at [facility], [address] [place], [place] [address]    Report Status PENDING  Incomplete  Culture, body fluid-bottle     Status: None (Preliminary result)   Collection Time: 04/11/20  3:08 PM   Specimen: Fluid  Result Value Ref Range Status   Specimen Description PENDING  Incomplete   Special Requests PENDING  Incomplete   Culture   Final    NO GROWTH < 12 HOURS Performed at [facility], [address] [place], [place] [address]    Report Status PENDING  Incomplete  Gram stain     Status: None   Collection Time: 04/11/20  3:08 PM   Specimen: Fluid  Result Value Ref Range Status   Specimen Description FLUID PLEURAL  Final   Special Requests NONE  Final   Gram Stain   Final    WBC PRESENT,BOTH PMN AND MONONUCLEAR NO ORGANISMS SEEN CYTOSPIN SMEAR Performed at [facility], [address] [place], [place] [address]    Report Status 04/11/2020 FINAL  Final  Gram stain     Status: None   Collection Time: 04/11/20  3:08 PM   Specimen: Fluid  Result Value Ref Range Status   Specimen Description FLUID PLEURAL  Final   Special Requests NONE  Final   Gram Stain   Final    WBC PRESENT,BOTH PMN AND MONONUCLEAR NO ORGANISMS SEEN CYTOSPIN SMEAR Performed at [facility], [address] [place], [place] [address]      Report Status 04/12/2020 FINAL  Final  Aerobic/Anaerobic Culture (surgical/deep wound)     Status: None (Preliminary result)   Collection Time: 04/12/20  1:40 PM   Specimen: Abdominal Fluid  Result Value Ref Range Status   Specimen Description   Final    FLUID ABDOMEN Performed at [facility], [address] [place], [place] [address]    Special Requests   Final    PERIHEPATIC FLUID Performed at [facility], [address] [place], [place] [address]    Gram Stain   Final    ABUNDANT WBC PRESENT, PREDOMINANTLY PMN RARE GRAM POSITIVE COCCI Performed at [facility], [address] [place], [place] [address]    Culture PENDING  Incomplete   Report Status PENDING  Incomplete         Radiology Studies: CT CHEST W CONTRAST  Result Date: 04/11/2020 CLINICAL DATA:  Pleural effusion, respiratory failure and COPD. EXAM: CT CHEST WITH CONTRAST TECHNIQUE: Multidetector CT imaging of the chest was performed during intravenous contrast administration. CONTRAST:  75mL OMNIPAQUE IOHEXOL 300 MG/ML  SOLN COMPARISON:  Chest radiograph 04/10/2020 FINDINGS: Despite efforts by the technologist and patient, motion artifact is present on today's exam and could not be eliminated. This reduces exam sensitivity and specificity. Cardiovascular: Coronary, aortic arch, and branch vessel atherosclerotic vascular disease. Mild cardiomegaly. Mediastinum/Nodes: Right thyroid lobe enlargement and nodularity. This was biopsied on 09/15/2013. This has been documented on prior studies (ref: J Am Coll Radiol. 2015 Feb;12(2): 143-50).Lower paratracheal lymph node 0.9 cm  in short axis on image 57/3. Right internal mammary lymph node versus venous varix measuring 0.7 cm in short axis diameter on image 89/3. Lungs/Pleura: The right pleural effusion has resolved status post thoracentesis. Volume loss and atelectasis in the right lower lobe and to a lesser extent in the right  middle lobe. Mild biapical pleuroparenchymal scarring. Hazy peripheral sub solid density in the left upper lobe peripherally for example on image 26/8, nonspecific but possibly reflecting local alveolitis. Upper Abdomen: Perihepatic ascites with abnormal free intraperitoneal fluid within the right upper quadrant ascites. By report the patient had a laparoscopic cholecystectomy with laparoscopic lysis of adhesions on 04/04/2020. However, the amount of right upper quadrant gas in the perihepatic fluid appears increased compared to the interval CT abdomen from 04/08/2020. Also, there is concavity of the right anterior contour of the liver on image 119/3 which was not present previously. Fullness of the left adrenal gland, not completely included on today's exam. Musculoskeletal: Thoracic kyphosis. Probable enchondroma in the right proximal humeral metaphysis. There is a small amount of gas tracking along the right chest wall, possibly procedure related. Old bilateral rib fractures and healing bilateral rib fractures are present. Old sternal body deformity from a prior fracture. IMPRESSION: 1. Right perihepatic fluid collection with enhancing margins is now indenting the liver margin, concerning for infected fluid collection/abscess. Also there is increasing multilocular gas within this fluid collection, concerning for subdiaphragmatic/perihepatic abscess. 2. The right pleural effusion has resolved status post thoracentesis. 3. Volume loss and atelectasis in the right lower lobe and to a lesser extent in the right middle lobe. 4. Hazy peripheral sub solid density in the left upper lobe peripherally, nonspecific but possibly reflecting local alveolitis. 5. Coronary, aortic arch, and branch vessel atherosclerotic vascular disease. Mild cardiomegaly. 6. Perihepatic ascites with abnormal free intraperitoneal fluid within the right upper quadrant ascites. 7. Old bilateral rib fractures and healing bilateral rib fractures. 8.  Stable fullness of the left adrenal gland, not completely included on today's exam. 9. Probable enchondroma in the right proximal humeral metaphysis. 10. Thoracic kyphosis. 11. Aortic atherosclerosis. Aortic Atherosclerosis (ICD10-I70.0). Electronically Signed   By: Gaylyn Rong M.D.   On: 04/11/2020 18:40   CT IMAGE GUIDED DRAINAGE BY PERCUTANEOUS CATHETER  Result Date: 04/12/2020 INDICATION: 86 year old with recent cholecystectomy. Patient has a perihepatic fluid collection containing gas. Request for image guided aspiration and drainage. EXAM: CT GUIDED DRAINAGE OF PERIHEPATIC FLUID COLLECTION MEDICATIONS: Moderate sedation ANESTHESIA/SEDATION: 1.0 mg IV Versed 50 mcg IV Fentanyl Moderate Sedation Time:  20 minutes The patient was continuously monitored during the procedure by the interventional radiology nurse under my direct supervision. COMPLICATIONS: None immediate. TECHNIQUE: Informed consent was obtained for CT-guided aspiration and drainage. All questions were addressed. Maximal Sterile Barrier Technique was utilized including caps, mask, sterile gowns, sterile gloves, sterile drape, hand hygiene and skin antiseptic. A timeout was performed prior to the initiation of the procedure. PROCEDURE: Patient was placed supine and CT images through the abdomen were obtained. The perihepatic collection was targeted for sampling. The right side of the abdomen was prepped with chlorhexidine and sterile field was created. Skin and soft tissues were anesthetized with 1% lidocaine. Using CT guidance, 18 gauge trocar needle was directed into the perihepatic collection. Dark red fluid was aspirated. Superstiff Amplatz wire was advanced into the collection. The tract was dilated to accommodate a 10 [place] drain. Approximately 100 mL of dark red fluid was removed from the collection. Follow up CT images were obtained. Drain was secured  to skin with suture and attached to a suction bulb. Bandage was placed.  FINDINGS: Initial CT images demonstrated a small right pleural effusion. Again noted is large perihepatic fluid collection along the right side of the liver. There is gas within this perihepatic collection. Again noted is gas in the cholecystectomy bed which may be related to surgical material. In addition, there may be another postoperative fluid collection measuring roughly 5.5 x 4.6 cm on sequence 2, image 46 that is caudal to the cholecystectomy bed. This collection appears new from the CT on 04/08/2020. However, it is difficult to differentiate bowel from this gas-filled fluid collection. Drain was successfully advanced into the perihepatic collection. Drain tip extends into the cephalad aspect the collection. Approximately 100 mL of fluid was removed. IMPRESSION: CT-guided aspiration and drainage of the perihepatic fluid collection. The perihepatic fluid is dark red and may represent postoperative hematoma. Concern for new fluid collection along the caudal aspect of the cholecystectomy bed measuring up to 5.5 cm. Recommend repeat CT of the abdomen and pelvis with IV and oral contrast when the perihepatic drainage decreases to evaluate for residual loculated fluid or separate collection along the caudal aspect of the cholecystectomy bed. Electronically Signed   By: Richarda Overlie M.D.   On: 04/12/2020 14:30   US THORACENTESIS ASP PLEURAL SPACE W/IMG GUIDE  Result Date: 04/11/2020 INDICATION: Respiratory failure. COPD. Right-sided pleural effusion. Request for diagnostic and therapeutic thoracentesis. EXAM: ULTRASOUND GUIDED RIGHT THORACENTESIS MEDICATIONS: None. COMPLICATIONS: None immediate. PROCEDURE: An ultrasound guided thoracentesis was thoroughly discussed with the patient and questions answered. The benefits, risks, alternatives and complications were also discussed. The patient understands and wishes to proceed with the procedure. Written consent was obtained. Ultrasound was performed to localize and  mark an adequate pocket of fluid in the right chest. The area was then prepped and draped in the normal sterile fashion. 1% Lidocaine was used for local anesthesia. Under ultrasound guidance a 6 Fr Safe-T-Centesis catheter was introduced. Thoracentesis was performed. The catheter was removed and a dressing applied. FINDINGS: A total of approximately 650 mL of hazy yellow fluid was removed. Samples were sent to the laboratory as requested by the clinical team. IMPRESSION: Successful ultrasound guided right thoracentesis yielding 650 mL of pleural fluid. Read by: Brayton El PA-C Electronically Signed   By: Simonne Come M.D.   On: 04/11/2020 15:07    Scheduled Meds: . apixaban  5 mg Oral BID  . atorvastatin  40 mg Oral Daily  . clopidogrel  75 mg Oral Daily  . feeding supplement (ENSURE ENLIVE)  237 mL Oral BID BM  . furosemide  20 mg Oral Daily  . irbesartan  300 mg Oral Daily  . polyethylene glycol  17 g Oral BID  . sodium chloride flush  5 mL Intracatheter Q8H   Continuous Infusions: . sodium chloride 250 mL (04/10/20 1350)  . anidulafungin    . piperacillin-tazobactam (ZOSYN)  IV 3.375 g (04/13/20 0753)   Time spent: 29 mins.More than 50% of that time was spent in counseling and/or coordination of care.    Moua Rasmusson Vergie Living, MD [facility] P5/13/2021, 8:13 AM

## 2020-04-13 NOTE — Evaluation (Addendum)
Clinical/Bedside Swallow Evaluation Patient Details  Name: Cheryl Atkinson MRN: [id number] Date of Birth: 04/07/1931  Today's Date: 04/13/2020 Time: SLP Start Time (ACUTE ONLY): 66 SLP Stop Time (ACUTE ONLY): 0945 SLP Time Calculation (min) (ACUTE ONLY): 25 min  Past Medical History:  Past Medical History:  Diagnosis Date  . Anemia   . Arthritis    "shoulders" (05/04/2015)  . Cellulitis of right lower extremity 11/27/2013  . CHF (congestive heart failure) ([place])   . Chronic lower back pain   . Constipation   . COPD (chronic obstructive pulmonary disease) ([place])   . GERD (gastroesophageal reflux disease)   . History of hiatal hernia   . HLD (hyperlipidemia)   . Hypertension   . Insomnia   . Osteoporosis   . Peripheral arterial disease ([place])   . Restless leg syndrome   . Thyroid disease    Past Surgical History:  Past Surgical History:  Procedure Laterality Date  . ABDOMINAL AORTAGRAM  05/04/2015   Procedure: Abdominal Aortagram;  Surgeon: Lorretta Harp, MD;  Location: [facility];  Service: Cardiovascular;;  . APPENDECTOMY    . CATARACT EXTRACTION W/ INTRAOCULAR LENS  IMPLANT, BILATERAL Bilateral   . CHOLECYSTECTOMY N/A 04/04/2020   Procedure: LAPAROSCOPIC CHOLECYSTECTOMY;  Surgeon: Georganna Skeans, MD;  Location: [place];  Service: General;  Laterality: N/A;  . I & D EXTREMITY Right 12/22/2016   Procedure: IRRIGATION AND DEBRIDEMENT EXTREMITY;  Surgeon: Milly Jakob, MD;  Location: [facility];  Service: Orthopedics;  Laterality: Right;  . IR EXCHANGE BILIARY DRAIN  03/13/2020  . IR PATIENT EVAL TECH 0-60 MINS  12/30/2019  . IR PERC CHOLECYSTOSTOMY  01/27/2020  . PERIPHERAL VASCULAR CATHETERIZATION N/A 05/04/2015   Procedure: Lower Extremity Angiography;  Surgeon: Lorretta Harp, MD;  Location: [place] [facility];  Service: Cardiovascular;  Laterality: N/A;  . PERIPHERAL VASCULAR CATHETERIZATION  05/04/2015   Procedure: Peripheral Vascular Intervention;  Surgeon: Lorretta Harp, MD;  Location: [facility];  Service: Cardiovascular;;  RCIA - 7x22 ICAST  . PERIPHERAL VASCULAR CATHETERIZATION Right 09/04/2015   Procedure: Peripheral Vascular Atherectomy;  Surgeon: Lorretta Harp, MD;  Location: [facility];  Service: Cardiovascular;  Laterality: Right;  SFA  . sfa Right 09/04/2015   de balloon  . THYROID SURGERY Right ?2013   "had goiter taken off my neck"  . TONSILLECTOMY    . VAGINAL HYSTERECTOMY     HPI:  86yo female admitted 04/06/20 with lethargy and confusion. PMH: AFib, CHF, COPD, GERD, HTN, HLD, osteoporosis, PVD, RLS, thyroid disease, arthritis, chronic lower back pain, recent lap chole.   Assessment / Plan / Recommendation Clinical Impression  Pt presents with adequate dentition (upper and lower dentures), functional oral motor strength. Pt reports no difficulty swallowing, and no overt s/s aspiration observed on thin liquid, puree, and solid trials. Recommend continuing current diet, pills with puree (crush large pills if able, per pt preference). SLP provided and reviewed written behavioral and dietary strategies for management of esophageal dysphagia with pt. No further ST intervention recommended at this time Please reconsult if needs arise.   Behavioral and Dietary Strategies for Management of Esophageal Dysmotility 1. Take reflux medications 30+ minutes before other po intake, in the morning 2. Begin meals with warm beverage 3. Eat smaller more frequent meals 4. Eat slowly, taking small bites and sips 5. Alternate solids and liquids 6. Avoid foods/liquids that increase acid production 7. Sit upright during and for 30+ minutes after meals  to facilitate esophageal clearing   SLP Visit Diagnosis: Dysphagia, unspecified (R13.10)    Aspiration Risk  Mild aspiration risk    Diet Recommendation Regular;Thin liquid   Liquid Administration via: Cup;Straw Medication Administration: Whole meds with puree Supervision: Patient able to  self feed;Staff to assist with self feeding Compensations: Slow rate;Small sips/bites;Minimize environmental distractions Postural Changes: Seated upright at 90 degrees;Remain upright for at least 30 minutes after po intake    Other  Recommendations Oral Care Recommendations: Oral care BID   Follow up Recommendations None          Prognosis   good     Swallow Study   General Date of Onset: 04/06/20 HPI: 86yo female admitted 04/06/20 with lethargy and confusion. PMH: AFib, CHF, COPD, GERD, HTN, HLD, osteoporosis, PVD, RLS, thyroid disease, arthritis, chronic lower back pain, recent lap chole. Type of Study: Bedside Swallow Evaluation Previous Swallow Assessment: none Diet Prior to this Study: Regular;Thin liquids Temperature Spikes Noted: No Respiratory Status: Room air History of Recent Intubation: No Behavior/Cognition: Alert;Pleasant mood;Cooperative Oral Cavity Assessment: Within Functional Limits Oral Care Completed by SLP: No Oral Cavity - Dentition: Dentures, top;Dentures, bottom Vision: Functional for self-feeding Self-Feeding Abilities: Able to feed self Patient Positioning: Upright in bed Baseline Vocal Quality: Normal Volitional Cough: Strong Volitional Swallow: Able to elicit    Oral/Motor/Sensory Function Overall Oral Motor/Sensory Function: Within functional limits   Ice Chips Ice chips: Not tested   Thin Liquid Thin Liquid: Within functional limits Presentation: Straw    Nectar Thick Nectar Thick Liquid: Not tested   Honey Thick Honey Thick Liquid: Not tested   Puree Puree: Within functional limits Presentation: Self Fed;Spoon   Solid     Solid: Within functional limits Presentation: Self Fed      Cheryl Atkinson B. Murvin Natal, [facility], CCC-SLP Speech Language Pathologist Office: [phone] Pager: [phone]  Leigh Aurora 04/13/2020,9:46 AM

## 2020-04-13 NOTE — Evaluation (Deleted)
Clinical/Bedside Swallow Evaluation Patient Details  Name: Cheryl Atkinson MRN: [id number] Date of Birth: 1931/07/25  Today's Date: 04/13/2020 Time: SLP Start Time (ACUTE ONLY): 0915 SLP Stop Time (ACUTE ONLY): 0931 SLP Time Calculation (min) (ACUTE ONLY): 16 min  Past Medical History:  Past Medical History:  Diagnosis Date  . Anemia   . Arthritis    "shoulders" (05/04/2015)  . Cellulitis of right lower extremity 11/27/2013  . CHF (congestive heart failure) ([facility])   . Chronic lower back pain   . Constipation   . COPD (chronic obstructive pulmonary disease) ([facility])   . GERD (gastroesophageal reflux disease)   . History of hiatal hernia   . HLD (hyperlipidemia)   . Hypertension   . Insomnia   . Osteoporosis   . Peripheral arterial disease ([facility])   . Restless leg syndrome   . Thyroid disease    Past Surgical History:  Past Surgical History:  Procedure Laterality Date  . ABDOMINAL AORTAGRAM  05/04/2015   Procedure: Abdominal Aortagram;  Surgeon: Runell Gess, MD;  Location: [facility];  Service: Cardiovascular;;  . APPENDECTOMY    . CATARACT EXTRACTION W/ INTRAOCULAR LENS  IMPLANT, BILATERAL Bilateral   . CHOLECYSTECTOMY N/A 04/04/2020   Procedure: LAPAROSCOPIC CHOLECYSTECTOMY;  Surgeon: Violeta Gelinas, MD;  Location: [facility] OR;  Service: General;  Laterality: N/A;  . I & D EXTREMITY Right 12/22/2016   Procedure: IRRIGATION AND DEBRIDEMENT EXTREMITY;  Surgeon: Mack Hook, MD;  Location: [facility] OR;  Service: Orthopedics;  Laterality: Right;  . IR EXCHANGE BILIARY DRAIN  03/13/2020  . IR PATIENT EVAL TECH 0-60 MINS  12/30/2019  . IR PERC CHOLECYSTOSTOMY  01/27/2020  . PERIPHERAL VASCULAR CATHETERIZATION N/A 05/04/2015   Procedure: Lower Extremity Angiography;  Surgeon: Runell Gess, MD;  Location: [facility] INVASIVE [facility] LAB;  Service: Cardiovascular;  Laterality: N/A;  . PERIPHERAL VASCULAR CATHETERIZATION  05/04/2015   Procedure: Peripheral Vascular Intervention;  Surgeon: Runell Gess, MD;  Location: [facility] IN[facility];  Service: Cardiovascular;;  RCIA - 7x22 ICAST  . PERIPHERAL VASCULAR CATHETERIZATION Right 09/04/2015   Procedure: Peripheral Vascular Atherectomy;  Surgeon: Runell Gess, MD;  Location: [facility] INVASIVE [facility];  Service: Cardiovascular;  Laterality: Right;  SFA  . sfa Right 09/04/2015   de balloon  . THYROID SURGERY Right ?2013   "had goiter taken off my neck"  . TONSILLECTOMY    . VAGINAL HYSTERECTOMY     HPI:  86yo female admitted 04/06/20 with lethargy and confusion. PMH: AFib, CHF, COPD, GERD, HTN, HLD, osteoporosis, PVD, RLS, thyroid disease, arthritis, chronic lower back pain, recent lap chole.   Assessment / Plan / Recommendation Clinical Impression  Pt presents with adequate dentition (upper and lower dentures), functional oral motor strength. Pt reports no difficulty swallowing, and no overt s/s aspiration observed on thin liquid, puree, and solid trials. Recommend continuing current diet, pills with puree (crush large pills per pt preference). No further ST intervention recommended at this time Please reconsult if needs arise.   SLP Visit Diagnosis: Dysphagia, unspecified (R13.10)    Aspiration Risk  Mild aspiration risk    Diet Recommendation Regular;Thin liquid   Liquid Administration via: Cup;Straw Medication Administration: Whole meds with puree Supervision: Patient able to self feed;Staff to assist with self feeding Compensations: Slow rate;Small sips/bites;Minimize environmental distractions Postural Changes: Seated upright at 90 degrees;Remain upright for at least 30 minutes after po intake    Other  Recommendations Oral Care Recommendations: Oral care BID   Follow  up Recommendations None          Prognosis   good     Swallow Study   General Date of Onset: 04/06/20 HPI: 86yo female admitted 04/06/20 with lethargy and confusion. PMH: AFib, CHF, COPD, GERD, HTN, HLD, osteoporosis, PVD, RLS, thyroid disease, arthritis,  chronic lower back pain, recent lap chole. Type of Study: Bedside Swallow Evaluation Previous Swallow Assessment: none Diet Prior to this Study: Regular;Thin liquids Temperature Spikes Noted: No Respiratory Status: Room air History of Recent Intubation: No Behavior/Cognition: Alert;Pleasant mood;Cooperative Oral Cavity Assessment: Within Functional Limits Oral Care Completed by SLP: No Oral Cavity - Dentition: Dentures, top;Dentures, bottom Vision: Functional for self-feeding Self-Feeding Abilities: Able to feed self Patient Positioning: Upright in bed Baseline Vocal Quality: Normal Volitional Cough: Strong Volitional Swallow: Able to elicit    Oral/Motor/Sensory Function Overall Oral Motor/Sensory Function: Within functional limits   Ice Chips Ice chips: Not tested   Thin Liquid Thin Liquid: Within functional limits Presentation: Straw    Nectar Thick Nectar Thick Liquid: Not tested   Honey Thick Honey Thick Liquid: Not tested   Puree Puree: Within functional limits Presentation: Self Fed;Spoon   Solid     Solid: Within functional limits Presentation: Govan B. Quentin Ore, [facility], [place] Speech Language Pathologist Office: [phone] Pager: [phone]  Shonna Chock 04/13/2020,9:31 AM

## 2020-04-13 NOTE — Progress Notes (Signed)
    CC:  Lethargy/confusion  Subjective: She has no abdominal pain, she says she was able to eat yesterday and was first time she had some appetite.  IR drain shows just old hematoma appearing drainage.  Objective: Vital signs in last 24 hours: Temp:  [97.4 F (36.3 C)-99.9 F (37.7 C)] 97.4 F (36.3 C) (05/13 0549) Pulse Rate:  [56-95] 56 (05/13 0549) Resp:  [20-27] 20 (05/13 0549) BP: (125-151)/(61-83) 151/80 (05/13 0549) SpO2:  [95 %-100 %] 99 % (05/13 0549) Last BM Date: 04/12/20 360 Po 900 IV 200 urine Drain 145 Stool x 1 Tm 99.9 VSS K+ 3.0 WBC 19.2 H/H 8.2/26  Intake/Output from previous day: 05/12 0701 - 05/13 0700 In: 1227.5 [P.O.:360; I.V.:70.7; IV Piggyback:766.9] Out: 345 [Urine:200; Drains:145] Intake/Output this shift: Total I/O In: -  Out: 20 [Drains:20]  General appearance: alert, cooperative and no distress Resp: clear to auscultation bilaterally GI: soft nontender, IR drain appears to be old hematoma.  + BS and + BM  Lab Results:  Recent Labs    04/12/20 0452 04/13/20 0510  WBC 23.2* 19.2*  HGB 9.3* 8.2*  HCT 28.3* 26.0*  PLT 217 257    BMET Recent Labs    04/12/20 0452 04/13/20 0510  NA 137 137  K 3.2* 3.0*  CL 104 105  CO2 22 22  GLUCOSE 106* 94  BUN 22 21  CREATININE 0.72 0.82  CALCIUM 7.3* 7.4*   PT/INR Recent Labs    04/12/20 0732  LABPROT 19.9*  INR 1.8*    Recent Labs  Lab 04/06/20 1831 04/09/20 0316  AST 27 21  ALT 20 19  ALKPHOS 93 85  BILITOT 0.9 1.1  PROT 7.1 6.3*  ALBUMIN 3.4* 2.8*     Lipase     Component Value Date/Time   LIPASE 17 04/06/2020 1831     Medications: . apixaban  5 mg Oral BID  . atorvastatin  40 mg Oral Daily  . clopidogrel  75 mg Oral Daily  . feeding supplement (ENSURE ENLIVE)  237 mL Oral BID BM  . furosemide  20 mg Oral Daily  . irbesartan  300 mg Oral Daily  . polyethylene glycol  17 g Oral BID  . sodium chloride flush  5 mL Intracatheter Q8H   . sodium chloride  250 mL (04/10/20 1350)  . anidulafungin    . piperacillin-tazobactam (ZOSYN)  IV 3.375 g (04/13/20 0753)    Assessment/Plan HTN HLD A fib on eliquis (LD5/11) PVD on plavix (LD5/11) COPD Hypokalemia -being replaced Anemia  - H/H 8.4/26.4>>8.9/27.5>>9.3/28.3>>8.2/26.0  AKI- resolved Acute hypoxic hypercarbic respiratory failure- improving, off Bipap, suspectrespiratory depression secondary to tramadol overuse  S/p CT-guided cholecystostomy tube placement 12/30/2019, Dr. Richarda Overlie S/p laparoscopic cholecystectomy 04/04/20 Dr. Janee Morn POD #8 - CT 5/8 without surgical complications, LFTs WNL  - CT chest 5/11: Right perihepatic fluid collection with enhancing margins now indenting the liver margins concerning for infected fluid/abscess/perihepatic abscess.  -IR  drain placement 5/12    -WBC 12.8>> 14.6>> 16.9>> 23.2>>19.2    ID -Maxipime 5/11-5/12; flagyl 5/11-5/12; Zosyn 5/12 >>  day 2; anidulafungin 5/12 >> day 2  VTE -SCDs, eliquis/plavix FEN - Heart healthy Foley -none Follow up -Dr. Janee Morn   Plan:  Continue drain, abx.  We will follow with you      LOS: 6 days    Cheryl Atkinson 04/13/2020 Please see Amion

## 2020-04-13 NOTE — Progress Notes (Signed)
Referring Physician(s): Michaell Cowing, Viviann Spare  Supervising Physician: Ruel Favors  Patient Status:  [facility] - In-pt  Chief Complaint: None  Subjective:  History of perihepatic fluid collection s/p perihepatic drain placement in IR 04/12/2020 by Dr. Lowella Dandy. Patient awake and alert laying in bed watching TV with no complaints at this time. States she feels "better than yesterday". Accompanied by sister at bedside. Perihepatic drain site c/d/i.   Allergies: Codeine  Medications: Prior to Admission medications   Medication Sig Start Date End Date Taking? Authorizing Provider  Albuterol Sulfate (PROAIR RESPICLICK) 108 (90 Base) MCG/ACT AEPB Inhale 2 puffs into the lungs every 6 (six) hours as needed (shortness of breath from COPD). 12/29/18  Yes Shelva Majestic, MD  apixaban (ELIQUIS) 5 MG TABS tablet Take 1 tablet (5 mg total) by mouth 2 (two) times daily. Start 04/06/20 04/05/20  Yes Violeta Gelinas, MD  atorvastatin (LIPITOR) 40 MG tablet Take 1 tablet (40 mg total) by mouth daily. 03/16/20  Yes Shelva Majestic, MD  clopidogrel (PLAVIX) 75 MG tablet Take 1 tablet (75 mg total) by mouth daily. Start 04/06/20 04/05/20  Yes Violeta Gelinas, MD  furosemide (LASIX) 40 MG tablet Take 1 tablet (40 mg total) by mouth every other day. 04/06/20  Yes Shelva Majestic, MD  irbesartan (AVAPRO) 300 MG tablet Take 1 tablet (300 mg total) by mouth daily. 03/25/20  Yes Shelva Majestic, MD  Multiple Vitamins-Minerals (PRESERVISION AREDS 2 PO) Take 1 tablet by mouth 2 (two) times daily.   Yes [provider]  NEUPRO 3 MG/24HR PT24 PLACE 1 PATCH (3 MG) ONTO THE SKIN AT BEDTIME Patient taking differently: Apply 1 patch topically at bedtime. Take off in the morning 03/29/20  Yes Shelva Majestic, MD  polyethylene glycol (MIRALAX / GLYCOLAX) 17 g packet Take 17 g by mouth 2 (two) times daily. Reported on 03/14/2016 Patient taking differently: Take 17 g by mouth daily as needed for mild constipation.  01/03/20  Yes  Hongalgi, Maximino Greenland, MD  potassium chloride SA (KLOR-CON) 20 MEQ tablet Take 1 tablet (20 mEq total) by mouth daily as needed (take on days that you take lasix). Patient taking differently: Take 20 mEq by mouth every other day.  03/25/20  Yes Shelva Majestic, MD  traZODone (DESYREL) 50 MG tablet Take 1.5 tablets (75 mg total) by mouth at bedtime as needed for sleep. 04/06/20  Yes Shelva Majestic, MD  sodium chloride flush (NS) 0.9 % SOLN 5 mLs by Intracatheter route every 8 (eight) hours. Patient not taking: Reported on 04/06/2020 01/03/20   Juliet Rude, PA-C     Vital Signs: BP (!) 151/80 (BP Location: Right Arm)   Pulse (!) 56   Temp (!) 97.4 F (36.3 C) (Oral)   Resp 20   Ht 5\' 6"  (1.676 m)   Wt 142 lb 3.2 oz (64.5 kg)   LMP  (LMP Unknown)   SpO2 99%   BMI 22.95 kg/m   Physical Exam Vitals and nursing note reviewed.  Constitutional:      General: She is not in acute distress. Pulmonary:     Effort: Pulmonary effort is normal. No respiratory distress.  Abdominal:     Comments: Perihepatic drain site without tenderness, erythema, drainage, or active bleeding; approximately 20 cc bloody fluid with debris in suction bulb; drain flushes/aspirates without resistance.  Skin:    General: Skin is warm and dry.  Neurological:     Mental Status: She is alert.  Imaging: DG Chest 1 View  Result Date: 04/10/2020 CLINICAL DATA:  Atrial fibrillation with shortness of breath. EXAM: CHEST  1 VIEW COMPARISON:  Apr 09, 2020 FINDINGS: Right pleural effusion again noted. There is apparent atelectatic change in the medial right base, stable. Lungs elsewhere are clear. Heart is mildly enlarged with pulmonary vascularity normal. There is stable aortic prominence with aortic atherosclerosis. No adenopathy. Chondroid matrix lesion in the proximal right humerus is stable. IMPRESSION: Persistent right pleural effusion an apparent medial right base atelectasis. Lungs elsewhere clear. Stable cardiac  silhouette.  Aortic Atherosclerosis (ICD10-I70.0). Stable chondroid matrix lesion in the proximal right humerus, likely either enchondroma or bone infarct in etiology. Electronically Signed   By: Lowella Grip III M.D.   On: 04/10/2020 13:46   CT CHEST W CONTRAST  Result Date: 04/11/2020 CLINICAL DATA:  Pleural effusion, respiratory failure and COPD. EXAM: CT CHEST WITH CONTRAST TECHNIQUE: Multidetector CT imaging of the chest was performed during intravenous contrast administration. CONTRAST:  74mL OMNIPAQUE IOHEXOL 300 MG/ML  SOLN COMPARISON:  Chest radiograph 04/10/2020 FINDINGS: Despite efforts by the technologist and patient, motion artifact is present on today's exam and could not be eliminated. This reduces exam sensitivity and specificity. Cardiovascular: Coronary, aortic arch, and branch vessel atherosclerotic vascular disease. Mild cardiomegaly. Mediastinum/Nodes: Right thyroid lobe enlargement and nodularity. This was biopsied on 09/15/2013. This has been documented on prior studies (ref: J Am Coll Radiol. 2015 Feb;12(2): 143-50).Lower paratracheal lymph node 0.9 cm in short axis on image 57/3. Right internal mammary lymph node versus venous varix measuring 0.7 cm in short axis diameter on image 89/3. Lungs/Pleura: The right pleural effusion has resolved status post thoracentesis. Volume loss and atelectasis in the right lower lobe and to a lesser extent in the right middle lobe. Mild biapical pleuroparenchymal scarring. Hazy peripheral sub solid density in the left upper lobe peripherally for example on image 26/8, nonspecific but possibly reflecting local alveolitis. Upper Abdomen: Perihepatic ascites with abnormal free intraperitoneal fluid within the right upper quadrant ascites. By report the patient had a laparoscopic cholecystectomy with laparoscopic lysis of adhesions on 04/04/2020. However, the amount of right upper quadrant gas in the perihepatic fluid appears increased compared to the  interval CT abdomen from 04/08/2020. Also, there is concavity of the right anterior contour of the liver on image 119/3 which was not present previously. Fullness of the left adrenal gland, not completely included on today's exam. Musculoskeletal: Thoracic kyphosis. Probable enchondroma in the right proximal humeral metaphysis. There is a small amount of gas tracking along the right chest wall, possibly procedure related. Old bilateral rib fractures and healing bilateral rib fractures are present. Old sternal body deformity from a prior fracture. IMPRESSION: 1. Right perihepatic fluid collection with enhancing margins is now indenting the liver margin, concerning for infected fluid collection/abscess. Also there is increasing multilocular gas within this fluid collection, concerning for subdiaphragmatic/perihepatic abscess. 2. The right pleural effusion has resolved status post thoracentesis. 3. Volume loss and atelectasis in the right lower lobe and to a lesser extent in the right middle lobe. 4. Hazy peripheral sub solid density in the left upper lobe peripherally, nonspecific but possibly reflecting local alveolitis. 5. Coronary, aortic arch, and branch vessel atherosclerotic vascular disease. Mild cardiomegaly. 6. Perihepatic ascites with abnormal free intraperitoneal fluid within the right upper quadrant ascites. 7. Old bilateral rib fractures and healing bilateral rib fractures. 8. Stable fullness of the left adrenal gland, not completely included on today's exam. 9. Probable enchondroma in the right  proximal humeral metaphysis. 10. Thoracic kyphosis. 11. Aortic atherosclerosis. Aortic Atherosclerosis (ICD10-I70.0). Electronically Signed   By: Gaylyn Rong M.D.   On: 04/11/2020 18:40   CT IMAGE GUIDED DRAINAGE BY PERCUTANEOUS CATHETER  Result Date: 04/12/2020 INDICATION: 86 year old with recent cholecystectomy. Patient has a perihepatic fluid collection containing gas. Request for image guided  aspiration and drainage. EXAM: CT GUIDED DRAINAGE OF PERIHEPATIC FLUID COLLECTION MEDICATIONS: Moderate sedation ANESTHESIA/SEDATION: 1.0 mg IV Versed 50 mcg IV Fentanyl Moderate Sedation Time:  20 minutes The patient was continuously monitored during the procedure by the interventional radiology nurse under my direct supervision. COMPLICATIONS: None immediate. TECHNIQUE: Informed consent was obtained for CT-guided aspiration and drainage. All questions were addressed. Maximal Sterile Barrier Technique was utilized including caps, mask, sterile gowns, sterile gloves, sterile drape, hand hygiene and skin antiseptic. A timeout was performed prior to the initiation of the procedure. PROCEDURE: Patient was placed supine and CT images through the abdomen were obtained. The perihepatic collection was targeted for sampling. The right side of the abdomen was prepped with chlorhexidine and sterile field was created. Skin and soft tissues were anesthetized with 1% lidocaine. Using CT guidance, 18 gauge trocar needle was directed into the perihepatic collection. Dark red fluid was aspirated. Superstiff Amplatz wire was advanced into the collection. The tract was dilated to accommodate a 10 [place] drain. Approximately 100 mL of dark red fluid was removed from the collection. Follow up CT images were obtained. Drain was secured to skin with suture and attached to a suction bulb. Bandage was placed. FINDINGS: Initial CT images demonstrated a small right pleural effusion. Again noted is large perihepatic fluid collection along the right side of the liver. There is gas within this perihepatic collection. Again noted is gas in the cholecystectomy bed which may be related to surgical material. In addition, there may be another postoperative fluid collection measuring roughly 5.5 x 4.6 cm on sequence 2, image 46 that is caudal to the cholecystectomy bed. This collection appears new from the CT on 04/08/2020. However, it is difficult  to differentiate bowel from this gas-filled fluid collection. Drain was successfully advanced into the perihepatic collection. Drain tip extends into the cephalad aspect the collection. Approximately 100 mL of fluid was removed. IMPRESSION: CT-guided aspiration and drainage of the perihepatic fluid collection. The perihepatic fluid is dark red and may represent postoperative hematoma. Concern for new fluid collection along the caudal aspect of the cholecystectomy bed measuring up to 5.5 cm. Recommend repeat CT of the abdomen and pelvis with IV and oral contrast when the perihepatic drainage decreases to evaluate for residual loculated fluid or separate collection along the caudal aspect of the cholecystectomy bed. Electronically Signed   By: Richarda Overlie M.D.   On: 04/12/2020 14:30   US THORACENTESIS ASP PLEURAL SPACE W/IMG GUIDE  Result Date: 04/11/2020 INDICATION: Respiratory failure. COPD. Right-sided pleural effusion. Request for diagnostic and therapeutic thoracentesis. EXAM: ULTRASOUND GUIDED RIGHT THORACENTESIS MEDICATIONS: None. COMPLICATIONS: None immediate. PROCEDURE: An ultrasound guided thoracentesis was thoroughly discussed with the patient and questions answered. The benefits, risks, alternatives and complications were also discussed. The patient understands and wishes to proceed with the procedure. Written consent was obtained. Ultrasound was performed to localize and mark an adequate pocket of fluid in the right chest. The area was then prepped and draped in the normal sterile fashion. 1% Lidocaine was used for local anesthesia. Under ultrasound guidance a 6 Fr Safe-T-Centesis catheter was introduced. Thoracentesis was performed. The catheter was removed and a dressing  applied. FINDINGS: A total of approximately 650 mL of hazy yellow fluid was removed. Samples were sent to the laboratory as requested by the clinical team. IMPRESSION: Successful ultrasound guided right thoracentesis yielding 650 mL  of pleural fluid. Read by: Brayton El PA-C Electronically Signed   By: Simonne Come M.D.   On: 04/11/2020 15:07    Labs:  CBC: Recent Labs    04/10/20 0357 04/11/20 0510 04/12/20 0452 04/13/20 0510  WBC 14.6* 16.9* 23.2* 19.2*  HGB 8.4* 8.9* 9.3* 8.2*  HCT 26.4* 27.5* 28.3* 26.0*  PLT 210 222 217 257    COAGS: Recent Labs    01/25/20 0100 01/25/20 0100 04/04/20 0847 04/07/20 1300 04/07/20 2301 04/08/20 0520 04/08/20 1701 04/09/20 0316 04/12/20 0732  INR 1.4*  --  1.1 1.3*  --   --   --   --  1.8*  APTT 28   < >  --  41*   < > 54* 65* 89* 43*   < > = values in this interval not displayed.    BMP: Recent Labs    04/10/20 0357 04/11/20 0510 04/12/20 0452 04/13/20 0510  NA 141 141 137 137  K 3.1* 3.7 3.2* 3.0*  CL 110 109 104 105  CO2 21* 21* 22 22  GLUCOSE 137* 125* 106* 94  BUN 30* 24* 22 21  CALCIUM 7.8* 7.8* 7.3* 7.4*  CREATININE 0.89 0.89 0.72 0.82  GFRNONAA 59* 59* >60 >60  GFRAA >60 >60 >60 >60    LIVER FUNCTION TESTS: Recent Labs    03/15/20 1038 04/04/20 0847 04/06/20 1831 04/09/20 0316  BILITOT 0.7 0.5 0.9 1.1  AST 13 16 27 21   ALT 9 14 20 19   ALKPHOS 99 87 93 85  PROT 7.1 7.4 7.1 6.3*  ALBUMIN 4.2 3.8 3.4* 2.8*    Assessment and Plan:  History of perihepatic fluid collection s/p perihepatic drain placement in IR 04/12/2020 by Dr. . Perihepatic drain site stable with approximately 20 cc bloody fluid with debris in suction bulb (additional 145 cc output from drain in past 24 hours per chart). Continue current drain management- continue with Qshift flushes/monitor of output. Plan for repeat CT when output <10 cc/day (assess for possible removal). Further plans per [facility]- appreciate and agree with management. IR to follow.   Electronically Signed: 06/12/2020, PA-C 04/13/2020, 1:08 PM   I spent a total of 25 Minutes at the the patient's bedside AND on the patient's hospital floor or unit, greater than 50% of which was  counseling/coordinating care for perihepatic fluid collection s/p perihepatic drain placement

## 2020-04-14 LAB — CBC WITH DIFFERENTIAL/PLATELET
Abs Immature Granulocytes: 0.16 10*3/uL — ABNORMAL HIGH (ref 0.00–0.07)
Basophils Absolute: 0 10*3/uL (ref 0.0–0.1)
Basophils Relative: 0 %
Eosinophils Absolute: 0.1 10*3/uL (ref 0.0–0.5)
Eosinophils Relative: 1 %
HCT: 26.8 % — ABNORMAL LOW (ref 36.0–46.0)
Hemoglobin: 8.4 g/dL — ABNORMAL LOW (ref 12.0–15.0)
Immature Granulocytes: 1 %
Lymphocytes Relative: 10 %
Lymphs Abs: 1.2 10*3/uL (ref 0.7–4.0)
MCH: 27.3 pg (ref 26.0–34.0)
MCHC: 31.3 g/dL (ref 30.0–36.0)
MCV: 87 fL (ref 80.0–100.0)
Monocytes Absolute: 1.3 10*3/uL — ABNORMAL HIGH (ref 0.1–1.0)
Monocytes Relative: 10 %
Neutro Abs: 9.6 10*3/uL — ABNORMAL HIGH (ref 1.7–7.7)
Neutrophils Relative %: 78 %
Platelets: 265 10*3/uL (ref 150–400)
RBC: 3.08 MIL/uL — ABNORMAL LOW (ref 3.87–5.11)
RDW: 14.9 % (ref 11.5–15.5)
WBC: 12.4 10*3/uL — ABNORMAL HIGH (ref 4.0–10.5)
nRBC: 0 % (ref 0.0–0.2)

## 2020-04-14 LAB — BASIC METABOLIC PANEL
Anion gap: 10 (ref 5–15)
BUN: 22 mg/dL (ref 8–23)
CO2: 22 mmol/L (ref 22–32)
Calcium: 7.3 mg/dL — ABNORMAL LOW (ref 8.9–10.3)
Chloride: 105 mmol/L (ref 98–111)
Creatinine, Ser: 0.89 mg/dL (ref 0.44–1.00)
GFR calc Af Amer: >60 mL/min (ref >60)
GFR calc non Af Amer: 59 mL/min — ABNORMAL LOW (ref >60)
Glucose, Bld: 111 mg/dL — ABNORMAL HIGH (ref 70–99)
Potassium: 2.7 mmol/L — CL (ref 3.5–5.1)
Sodium: 137 mmol/L (ref 135–145)

## 2020-04-14 MED ORDER — LINEZOLID 600 MG PO TABS
600.0000 mg | ORAL_TABLET | Freq: Two times a day (BID) | ORAL | Status: DC
  Administered 2020-04-14 – 2020-04-17 (×6): 600 mg via ORAL
  Filled 2020-04-14 (×6): qty 1

## 2020-04-14 MED ORDER — MAGNESIUM SULFATE 2 GM/50ML IV SOLN
2.0000 g | Freq: Once | INTRAVENOUS | Status: AC
  Administered 2020-04-14: 2 g via INTRAVENOUS
  Filled 2020-04-14: qty 50

## 2020-04-14 MED ORDER — POTASSIUM CHLORIDE 10 MEQ/100ML IV SOLN
10.0000 meq | INTRAVENOUS | Status: AC
  Administered 2020-04-14 (×4): 10 meq via INTRAVENOUS
  Filled 2020-04-14 (×3): qty 100

## 2020-04-14 NOTE — Progress Notes (Signed)
Pt had a critical lab Potassium 2.7 Writer notified provider Will continue to monitor

## 2020-04-14 NOTE — TOC Progression Note (Signed)
Transition of Care [facility]) - Progression Note    Patient Details  Name: Cheryl Atkinson MRN: [id number] Date of Birth: 06-03-1931  [facility] CM/SW Contact  Darleene Cleaver, [place] Phone Number: 04/14/2020, 5:39 PM  Clinical Narrative:     Patient not medically ready for discharge per physician, CSW already set up SNF placement at [facility] and paperwork has been completed by patient's son.  Expected Discharge Plan: Home w Home Health Services Barriers to Discharge: Continued Medical Work up  Expected Discharge Plan and Services Expected Discharge Plan: Home w Home Health Services     Post Acute Care Choice: Home Health Living arrangements for the past 2 months: Single Family Home Expected Discharge Date: (unknown)                 DME Agency: NA       HH Arranged: PT, OT, Nurse's Aide, RN, Social Work, Refused SNF           Social Determinants of Health (SDOH) Interventions    Readmission Risk Interventions No flowsheet data found.

## 2020-04-14 NOTE — Care Management Important Message (Signed)
Important Message  Patient Details IM letter given to [name] SW Case Manager to present to the Patent Name: Cheryl Atkinson MRN: [id number] Date of Birth: [birth year]   Medicare Important Message Given:  Yes     Caren Macadam 04/14/2020, 10:49 AM

## 2020-04-14 NOTE — Progress Notes (Signed)
PROGRESS NOTE    Cheryl Atkinson  GHW:[id number] DOB: [birth year] DOA: 04/06/2020 PCP: Shelva Majestic, MD   Brief Narrative: Patient is a 86 year old female with history of persistent A. fib, chronic diastolic CHF, COPD, GERD, hypertension, hyperlipidemia, peripheral vascular disease who was recently admitted here for cholecystitis and underwent laparoscopic cholecystectomy.  She was discharged from here on 04/05/2020 and prescribed tramadol for pain.  Patient was brought back to the emerge department by family because of concern of lethargy, confusion.  He saturated 87% on room air, had to be placed on supplemental oxygen and eventually on BiPAP due to decreased mentation, somnolence.  ABG showed hypercarbia.  She was also febrile on presentation with leukocytosis.  Found to have AKI.  Chest x-ray did not show any acute abnormality.  Also appeared very dehydrated on presentation. She had to be started on Narcan as needed for opioid toxicity and was given IV fluids. Overall condition improved.  Currently she is maintaining her saturation on room air but had episodes of tachypnea.  Chest x-ray done on 04/10/2020 showed right pleural effusion,underwent thoracentesis with removal of 650 ml of fluid. Hospital course remarkable for persistent fever, worsening leukocytosis.  CT chest with contrast showed perihepatic/subdiaphragmatic abscess.  Antibiotics coverage broadened.  General surgery following again.  IR abscess drainage 5/12, on Zosyn. PT has recommended skilled nursing facility.  Assessment & Plan:  Principal Problem:   Acute respiratory failure with hypoxia (HCC) Active Problems:   COPD (chronic obstructive pulmonary disease) (HCC)   GERD   Insulin resistance   DNR (do not resuscitate)   Memory loss   BPPV (benign paroxysmal positional vertigo)   Hypokalemia   Insomnia   S/P laparoscopic cholecystectomy   Acute respiratory failure (HCC)   Encephalopathy   AKI (acute kidney injury)  (HCC)   Postoperative hematoma involving digestive system following digestive system procedure  Perihepatic/subdiaphragmatic abscess:She had persistent fever, worsening leukocytosis.  CT chest with contrast showed perihepatic/subdiaphragmatic abscess.  Antibiotics coverage broadened.  General surgery following again.  IR consulted for abscess drainage done 5/12 and abx broadened to Zosyn.  IR recommends repeat CT with contrast after drainage tapers off, to check for further loculations, etc.  Follow cx data, culture obtained 5/12.  Acute hypoxic hypercarbic respiratory failure: On presentation.Most likely secondary to respiratory depression secondary to tramadol overuse.  She was just discharged from here after cholecystectomy and was prescribed tramadol for pain.  Chest x-ray does not show any acute findings.  She was hypoxic on room air and had to be put on BiPAP on admission.  ABG showed hypercarbia with CO2 of 68.  Mentation  improved after she was  placed on BiPAP and with  Narcan. Hold any opiates or sedating medications.   CT head did not show any acute intracranial abnormalities. Mental and respiratory status improved and she has been maintaining her saturation on room air.  Off BiPAP. Her home Lasix has been restarted.Chest x-ray done on 04/10/2020 showed right pleural effusion.  Underwent  ultrasound-guided thoracentesis of the right chest.  AKI: Resolved with IV fluids.  Renal ultrasound did not show any acute findings. Foley was placed on admission which has been discontinued.  Acute cholecystitis status post laparoscopic cholecystectomy on 04/04/2020: Presented with significant leukocytosis, fever of 100 F.  Noted hypotension on arrival, lactic cidosis.  General surgery following.   CT abdomen/pelvis done here did not show complication of recent surgery. But CT chest showed perihepatic/subdiapshragmatic abscess being managed as above.  Permanent A. fib:  Currently rate is controlled.  On  Eliquis for anticoagulation.  Not on any rate control agents.  Eliquis was held for drain placement and has been resumed.  Hypokalemia: Being supplemented.  Peripheral vascular disease: On Plavix, Lipitor.    COPD: Currently stable.  No signs of acute exacerbation.  Continue bronchodilators as needed.  Hypertension: On ARB at home which is which has been resumed.  Continue to monitor.   Debility/deconditioning: PT recommended skilled nursing facility.SW consulted and following.     DVT prophylaxis: Eliquis Code Status: DNR Family Communication: Called and discussed with  Son on phone on 04/11/20 Status is: inaptient  Dispo: The patient is from: Home              Anticipated d/c is to:SNF              Anticipated d/c date is:2-3 days              Patient currently is not medically stable to d/c.   Needs IR guided drainage for perihepatic abscess.  Consultants: Surgery  Procedures:None  Antimicrobials:  Anti-infectives (From admission, onward)   Start     Dose/Rate Route Frequency Ordered Stop   04/13/20 1000  anidulafungin (ERAXIS) 100 mg in sodium chloride 0.9 % 100 mL IVPB     100 mg 78 mL/hr over 100 Minutes Intravenous Every 24 hours 04/12/20 0734     04/12/20 1000  anidulafungin (ERAXIS) 200 mg in sodium chloride 0.9 % 200 mL IVPB     200 mg 78 mL/hr over 200 Minutes Intravenous  Once 04/12/20 0734 04/12/20 1516   04/12/20 0815  anidulafungin (ERAXIS) 100 mg in sodium chloride 0.9 % 100 mL IVPB  Status:  Discontinued     100 mg 78 mL/hr over 100 Minutes Intravenous Every 24 hours 04/12/20 0721 04/12/20 0734   04/12/20 0800  piperacillin-tazobactam (ZOSYN) IVPB 3.375 g     3.375 g 12.5 mL/hr over 240 Minutes Intravenous Every 8 hours 04/12/20 0718     04/11/20 2000  metroNIDAZOLE (FLAGYL) IVPB 500 mg  Status:  Discontinued     500 mg 100 mL/hr over 60 Minutes Intravenous Every 8 hours 04/11/20 1859 04/12/20 0718   04/11/20 0800  vancomycin (VANCOREADY) IVPB 1250 mg/250  mL  Status:  Discontinued     1,250 mg 166.7 mL/hr over 90 Minutes Intravenous Every 24 hours 04/10/20 1602 04/12/20 1048   04/10/20 2200  ceFEPIme (MAXIPIME) 2 g in sodium chloride 0.9 % 100 mL IVPB  Status:  Discontinued     2 g 200 mL/hr over 30 Minutes Intravenous Every 12 hours 04/10/20 1552 04/12/20 0718   04/10/20 1400  vancomycin (VANCOCIN) IVPB 1000 mg/200 mL premix     1,000 mg 200 mL/hr over 60 Minutes Intravenous  Once 04/10/20 1256 04/10/20 1451   04/10/20 1400  piperacillin-tazobactam (ZOSYN) IVPB 3.375 g     3.375 g 100 mL/hr over 30 Minutes Intravenous  Once 04/10/20 1256 04/10/20 1417      Subjective: Patient seen and examined at the bedside this morning.  Hemodynamically stable, has no complaints. She now has an appetite and is feeling better.  Objective: Vitals:   04/13/20 0549 04/13/20 1326 04/13/20 2047 04/14/20 0633  BP: (!) 151/80 134/71 (!) 144/63 (!) 144/64  Pulse: (!) 56 67 (!) 54 (!) 59  Resp: 20  20 18   Temp: (!) 97.4 F (36.3 C) 98.5 F (36.9 C) 97.9 F (36.6 C) 98.1 F (36.7 C)  TempSrc: Oral Oral  Oral Oral  SpO2: 99% 99% 99% 98%  Weight:      Height:        Intake/Output Summary (Last 24 hours) at 04/14/2020 0835 Last data filed at 04/14/2020 [id number] Gross per 24 hour  Intake 360 ml  Output 1000 ml  Net -640 ml   Filed Weights   04/07/20 1621  Weight: 64.5 kg    Examination:  General exam: Elderly debilitated female, comfortable  Respiratory system: Diminished air sound on the right side , R chest JP drain with scant bloody material (was just emptied per patient) cardiovascular system: Irregularly irregular rhythm. No JVD, murmurs, rubs, gallops or clicks. Gastrointestinal system: Abdomen is nondistended, soft and has very mild tenderness on the right subcostal region. No organomegaly or masses felt. Normal bowel sounds heard. Central nervous system: Alert and oriented. No focal neurological deficits. Extremities: No edema, no clubbing  ,no cyanosis, distal peripheral pulses palpable. Skin: No rashes, lesions or ulcers,no icterus ,no pallor   Data Reviewed: I have personally reviewed following labs and imaging studies  CBC: Recent Labs  Lab 04/10/20 0357 04/11/20 0510 04/12/20 0452 04/13/20 0510 04/14/20 0509  WBC 14.6* 16.9* 23.2* 19.2* 12.4*  NEUTROABS 12.4* 14.1* 20.4* 16.7* 9.6*  HGB 8.4* 8.9* 9.3* 8.2* 8.4*  HCT 26.4* 27.5* 28.3* 26.0* 26.8*  MCV 86.3 85.4 83.7 86.7 87.0  PLT 210 222 217 257 265   Basic Metabolic Panel: Recent Labs  Lab 04/10/20 0357 04/11/20 0510 04/12/20 0452 04/13/20 0510 04/14/20 0509  NA 141 141 137 137 137  K 3.1* 3.7 3.2* 3.0* 2.7*  CL 110 109 104 105 105  CO2 21* 21* 22 22 22   GLUCOSE 137* 125* 106* 94 111*  BUN 30* 24* 22 21 22   CREATININE 0.89 0.89 0.72 0.82 0.89  CALCIUM 7.8* 7.8* 7.3* 7.4* 7.3*   GFR: Estimated Creatinine Clearance: 42.5 mL/min (by C-G formula based on SCr of 0.89 mg/dL). Liver Function Tests: Recent Labs  Lab 04/09/20 0316  AST 21  ALT 19  ALKPHOS 85  BILITOT 1.1  PROT 6.3*  ALBUMIN 2.8*   No results for input(s): LIPASE, AMYLASE in the last 168 hours. No results for input(s): AMMONIA in the last 168 hours. Coagulation Profile: Recent Labs  Lab 04/07/20 1300 04/12/20 0732  INR 1.3* 1.8*   Cardiac Enzymes: No results for input(s): CKTOTAL, CKMB, CKMBINDEX, TROPONINI in the last 168 hours. BNP (last 3 results) Recent Labs    02/11/20 0936  PROBNP 228.0*   HbA1C: No results for input(s): HGBA1C in the last 72 hours. CBG: Recent Labs  Lab 04/08/20 1939  GLUCAP 108*   Lipid Profile: No results for input(s): CHOL, HDL, LDLCALC, TRIG, CHOLHDL, LDLDIRECT in the last 72 hours. Thyroid Function Tests: No results for input(s): TSH, T4TOTAL, FREET4, T3FREE, THYROIDAB in the last 72 hours. Anemia Panel: No results for input(s): VITAMINB12, FOLATE, FERRITIN, TIBC, IRON, RETICCTPCT in the last 72 hours. Sepsis Labs: No results for  input(s): PROCALCITON, LATICACIDVEN in the last 168 hours.  Recent Results (from the past 240 hour(s))  Respiratory Panel by RT PCR (Flu A&B, Covid) - Nasopharyngeal Swab     Status: None   Collection Time: 04/06/20  6:37 PM   Specimen: Nasopharyngeal Swab  Result Value Ref Range Status   SARS Coronavirus 2 by RT PCR NEGATIVE NEGATIVE Final    Comment: (NOTE) SARS-CoV-2 target nucleic acids are NOT DETECTED. The SARS-CoV-2 RNA is generally detectable in upper respiratoy specimens during the acute phase of infection.  The lowest concentration of SARS-CoV-2 viral copies this assay can detect is 131 copies/mL. A negative result does not preclude SARS-Cov-2 infection and should not be used as the sole basis for treatment or other patient management decisions. A negative result may occur with  improper specimen collection/handling, submission of specimen other than nasopharyngeal swab, presence of viral mutation(s) within the areas targeted by this assay, and inadequate number of viral copies (<131 copies/mL). A negative result must be combined with clinical observations, patient history, and epidemiological information. The expected result is Negative. Fact Sheet for Patients:  PinkCheek.be Fact Sheet for Healthcare Providers:  GravelBags.it This test is not yet ap proved or cleared by the [place] FDA and  has been authorized for detection and/or diagnosis of SARS-CoV-2 by FDA under an Emergency Use Authorization (EUA). This EUA will remain  in effect (meaning this test can be used) for the duration of the COVID-19 declaration under Section 564(b)(1) of the Act, 21 U.S.C. section 360bbb-3(b)(1), unless the authorization is terminated or revoked sooner.    Influenza A by PCR NEGATIVE NEGATIVE Final   Influenza B by PCR NEGATIVE NEGATIVE Final    Comment: (NOTE) The Xpert Xpress SARS-CoV-2/FLU/RSV assay is intended as an aid  in  the diagnosis of influenza from Nasopharyngeal swab specimens and  should not be used as a sole basis for treatment. Nasal washings and  aspirates are unacceptable for Xpert Xpress SARS-CoV-2/FLU/RSV  testing. Fact Sheet for Patients: PinkCheek.be Fact Sheet for Healthcare Providers: GravelBags.it This test is not yet approved or cleared by the [place] FDA and  has been authorized for detection and/or diagnosis of SARS-CoV-2 by  FDA under an Emergency Use Authorization (EUA). This EUA will remain  in effect (meaning this test can be used) for the duration of the  Covid-19 declaration under Section 564(b)(1) of the Act, 21  U.S.C. section 360bbb-3(b)(1), unless the authorization is  terminated or revoked. Performed at [facility], [place] [address], [place], [place] [address]   MRSA PCR Screening     Status: None   Collection Time: 04/07/20  4:02 PM   Specimen: Nasal Mucosa; Nasopharyngeal  Result Value Ref Range Status   MRSA by PCR NEGATIVE NEGATIVE Final    Comment:        The GeneXpert MRSA Assay (FDA approved for NASAL specimens only), is one component of a comprehensive MRSA colonization surveillance program. It is not intended to diagnose MRSA infection nor to guide or monitor treatment for MRSA infections. Performed at [facility], [place] [address], [place], [place] [address]   Culture, blood (routine x 2)     Status: None (Preliminary result)   Collection Time: 04/10/20  1:08 PM   Specimen: BLOOD LEFT HAND  Result Value Ref Range Status   Specimen Description   Final    BLOOD LEFT HAND Performed at [place] [address] [place], [place] [address]    Special Requests   Final    BOTTLES DRAWN AEROBIC ONLY Blood Culture results may not be optimal due to an inadequate volume of blood received in culture bottles Performed at [place] [address], [place], [place] [address]    Culture   Final    NO GROWTH 3 DAYS Performed at [facility], [place] [address], [place], [place] [address]    Report Status PENDING  Incomplete  Culture, blood (routine x 2)     Status: None (Preliminary  result)   Collection Time: 04/10/20  1:08 PM   Specimen: BLOOD  Result Value Ref Range Status   Specimen Description   Final    BLOOD RIGHT ANTECUBITAL Performed at [facility], [address] [place], [place] [address]    Special Requests   Final    BOTTLES DRAWN AEROBIC AND ANAEROBIC Blood Culture adequate volume Performed at [facility], [address] [place], [place] [address]    Culture   Final    NO GROWTH 3 DAYS Performed at [facility], [address] [place], [place] [address]    Report Status PENDING  Incomplete  Culture, body fluid-bottle     Status: None (Preliminary result)   Collection Time: 04/11/20  3:08 PM   Specimen: Fluid  Result Value Ref Range Status   Specimen Description PENDING  Incomplete   Special Requests PENDING  Incomplete   Culture   Final    NO GROWTH 2 DAYS Performed at [facility], [address] [place], [place] [address]    Report Status PENDING  Incomplete  Gram stain     Status: None   Collection Time: 04/11/20  3:08 PM   Specimen: Fluid  Result Value Ref Range Status   Specimen Description FLUID PLEURAL  Final   Special Requests NONE  Final   Gram Stain   Final    WBC PRESENT,BOTH PMN AND MONONUCLEAR NO ORGANISMS SEEN CYTOSPIN SMEAR Performed at [facility], [address] [place], [place] [address]    Report Status 04/11/2020 FINAL  Final  Culture, body fluid-bottle     Status: None (Preliminary result)   Collection Time: 04/11/20  3:08 PM   Specimen: Fluid  Result Value Ref Range Status   Specimen Description FLUID PLEURAL  Final   Special Requests BOTTLES DRAWN AEROBIC AND ANAEROBIC   Final   Culture   Final    NO GROWTH 2 DAYS Performed at [facility], [address], [place], [place] [address]    Report Status PENDING  Incomplete  Gram stain     Status: None   Collection Time: 04/11/20  3:08 PM   Specimen: Fluid  Result Value Ref Range Status   Specimen Description FLUID PLEURAL  Final   Special Requests NONE  Final   Gram Stain   Final    WBC PRESENT,BOTH PMN AND MONONUCLEAR NO ORGANISMS SEEN CYTOSPIN SMEAR Performed at [facility], [address], [place], [place] [address]    Report Status 04/12/2020 FINAL  Final  Aerobic/Anaerobic Culture (surgical/deep wound)     Status: None (Preliminary result)   Collection Time: 04/12/20  1:40 PM   Specimen: Abdominal Fluid  Result Value Ref Range Status   Specimen Description   Final    FLUID ABDOMEN Performed at [facility], [address], [place], [place] [address]    Special Requests   Final    PERIHEPATIC FLUID Performed at [facility], [address] [place], [place] [address]    Gram Stain   Final    ABUNDANT WBC PRESENT, PREDOMINANTLY PMN RARE GRAM POSITIVE COCCI Performed at [facility], [address] [place], [place] [address]    Culture FEW ENTEROCOCCUS FAECIUM  Final   Report Status PENDING  Incomplete    Radiology Studies: CT IMAGE GUIDED DRAINAGE BY PERCUTANEOUS CATHETER  Result Date: 04/12/2020 INDICATION: 86 year old with recent cholecystectomy. Patient has a perihepatic  fluid collection containing gas. Request for image guided aspiration and drainage. EXAM: CT GUIDED DRAINAGE OF PERIHEPATIC FLUID COLLECTION MEDICATIONS: Moderate sedation ANESTHESIA/SEDATION: 1.0 mg IV Versed 50 mcg IV Fentanyl Moderate Sedation Time:  20 minutes The patient was continuously monitored during the procedure by the interventional radiology nurse under my direct supervision. COMPLICATIONS: None immediate. TECHNIQUE: Informed consent was obtained for  CT-guided aspiration and drainage. All questions were addressed. Maximal Sterile Barrier Technique was utilized including caps, mask, sterile gowns, sterile gloves, sterile drape, hand hygiene and skin antiseptic. A timeout was performed prior to the initiation of the procedure. PROCEDURE: Patient was placed supine and CT images through the abdomen were obtained. The perihepatic collection was targeted for sampling. The right side of the abdomen was prepped with chlorhexidine and sterile field was created. Skin and soft tissues were anesthetized with 1% lidocaine. Using CT guidance, 18 gauge trocar needle was directed into the perihepatic collection. Dark red fluid was aspirated. Superstiff Amplatz wire was advanced into the collection. The tract was dilated to accommodate a 10 [place] drain. Approximately 100 mL of dark red fluid was removed from the collection. Follow up CT images were obtained. Drain was secured to skin with suture and attached to a suction bulb. Bandage was placed. FINDINGS: Initial CT images demonstrated a small right pleural effusion. Again noted is large perihepatic fluid collection along the right side of the liver. There is gas within this perihepatic collection. Again noted is gas in the cholecystectomy bed which may be related to surgical material. In addition, there may be another postoperative fluid collection measuring roughly 5.5 x 4.6 cm on sequence 2, image 46 that is caudal to the cholecystectomy bed. This collection appears new from the CT on 04/08/2020. However, it is difficult to differentiate bowel from this gas-filled fluid collection. Drain was successfully advanced into the perihepatic collection. Drain tip extends into the cephalad aspect the collection. Approximately 100 mL of fluid was removed. IMPRESSION: CT-guided aspiration and drainage of the perihepatic fluid collection. The perihepatic fluid is dark red and may represent postoperative hematoma. Concern for new fluid  collection along the caudal aspect of the cholecystectomy bed measuring up to 5.5 cm. Recommend repeat CT of the abdomen and pelvis with IV and oral contrast when the perihepatic drainage decreases to evaluate for residual loculated fluid or separate collection along the caudal aspect of the cholecystectomy bed. Electronically Signed   By: Richarda Overlie M.D.   On: 04/12/2020 14:30   Scheduled Meds: . apixaban  5 mg Oral BID  . atorvastatin  40 mg Oral Daily  . clopidogrel  75 mg Oral Daily  . feeding supplement (ENSURE ENLIVE)  237 mL Oral BID BM  . furosemide  20 mg Oral Daily  . irbesartan  300 mg Oral Daily  . polyethylene glycol  17 g Oral BID  . sodium chloride flush  5 mL Intracatheter Q8H   Continuous Infusions: . sodium chloride 250 mL (04/10/20 1350)  . anidulafungin 100 mg (04/13/20 1000)  . piperacillin-tazobactam (ZOSYN)  IV 3.375 g (04/14/20 0005)  . potassium chloride 10 mEq (04/14/20 4540)   Time spent: 23 mins.More than 50% of that time was spent in counseling and/or coordination of care.  Kely Dohn Vergie Living, MD [facility] P5/14/2021, 8:35 AM

## 2020-04-14 NOTE — Consult Note (Signed)
   [facility] CM Inpatient Consult   04/14/2020  Cheryl Atkinson August 21, 1931 [id number]   Patient chart has been reviewed for readmissions less than 30 days and for high risk score for unplanned readmissions.  Patient assessed for community [facility] Care Management follow up needs.   Chart review reveals patient current disposition plan is for SNF. No [facility] CM identifiable needs.  Christophe Louis, MSN, RN [facility] Liaison Nurse Mobile Phone [phone]  Toll free office [phone].s

## 2020-04-14 NOTE — Progress Notes (Signed)
Referring Physician(s): Gross,S  Supervising Physician: Sandi Mariscal  Patient Status:  [facility] - In-pt  Chief Complaint: Perihepatic fluid collection/abscess   Subjective: Pt doing ok; has some discomfort/burning at rt abd drain site; denies N/V   Allergies: Codeine  Medications: Prior to Admission medications   Medication Sig Start Date End Date Taking? Authorizing Provider  Albuterol Sulfate (PROAIR RESPICLICK) 347 (90 Base) MCG/ACT AEPB Inhale 2 puffs into the lungs every 6 (six) hours as needed (shortness of breath from COPD). 12/29/18  Yes Marin Olp, MD  apixaban (ELIQUIS) 5 MG TABS tablet Take 1 tablet (5 mg total) by mouth 2 (two) times daily. Start 04/06/20 04/05/20  Yes Georganna Skeans, MD  atorvastatin (LIPITOR) 40 MG tablet Take 1 tablet (40 mg total) by mouth daily. 03/16/20  Yes Marin Olp, MD  clopidogrel (PLAVIX) 75 MG tablet Take 1 tablet (75 mg total) by mouth daily. Start 04/06/20 04/05/20  Yes Georganna Skeans, MD  furosemide (LASIX) 40 MG tablet Take 1 tablet (40 mg total) by mouth every other day. 04/06/20  Yes Marin Olp, MD  irbesartan (AVAPRO) 300 MG tablet Take 1 tablet (300 mg total) by mouth daily. 03/25/20  Yes Marin Olp, MD  Multiple Vitamins-Minerals (PRESERVISION AREDS 2 PO) Take 1 tablet by mouth 2 (two) times daily.   Yes [provider]  NEUPRO 3 MG/24HR PT24 PLACE 1 PATCH (3 MG) ONTO THE SKIN AT BEDTIME Patient taking differently: Apply 1 patch topically at bedtime. Take off in the morning 03/29/20  Yes Marin Olp, MD  polyethylene glycol (MIRALAX / GLYCOLAX) 17 g packet Take 17 g by mouth 2 (two) times daily. Reported on 03/14/2016 Patient taking differently: Take 17 g by mouth daily as needed for mild constipation.  01/03/20  Yes Hongalgi, Lenis Dickinson, MD  potassium chloride SA (KLOR-CON) 20 MEQ tablet Take 1 tablet (20 mEq total) by mouth daily as needed (take on days that you take lasix). Patient taking differently: Take  20 mEq by mouth every other day.  03/25/20  Yes Marin Olp, MD  traZODone (DESYREL) 50 MG tablet Take 1.5 tablets (75 mg total) by mouth at bedtime as needed for sleep. 04/06/20  Yes Marin Olp, MD  sodium chloride flush (NS) 0.9 % SOLN 5 mLs by Intracatheter route every 8 (eight) hours. Patient not taking: Reported on 04/06/2020 01/03/20   Norm Parcel, PA-C     Vital Signs: BP (!) 144/64 (BP Location: Right Arm)   Pulse (!) 59   Temp 98.1 F (36.7 C) (Oral)   Resp 18   Ht 5\' 6"  (1.676 m)   Wt 142 lb 3.2 oz (64.5 kg)   LMP  (LMP Unknown)   SpO2 98%   BMI 22.95 kg/m   Physical Exam awake/alert; rt perihepatic drain intact, insertion site ok, no sig erythema or drainage at site, OP 120 cc yesterday, 20 cc so far today blood-tinged fluid; drain flushes ok  Imaging: DG Chest 1 View  Result Date: 04/10/2020 CLINICAL DATA:  Atrial fibrillation with shortness of breath. EXAM: CHEST  1 VIEW COMPARISON:  Apr 09, 2020 FINDINGS: Right pleural effusion again noted. There is apparent atelectatic change in the medial right base, stable. Lungs elsewhere are clear. Heart is mildly enlarged with pulmonary vascularity normal. There is stable aortic prominence with aortic atherosclerosis. No adenopathy. Chondroid matrix lesion in the proximal right humerus is stable. IMPRESSION: Persistent right pleural effusion an apparent medial right base atelectasis. Lungs  elsewhere clear. Stable cardiac silhouette.  Aortic Atherosclerosis (ICD10-I70.0). Stable chondroid matrix lesion in the proximal right humerus, likely either enchondroma or bone infarct in etiology. Electronically Signed   By: Bretta Bang III M.D.   On: 04/10/2020 13:46   CT CHEST W CONTRAST  Result Date: 04/11/2020 CLINICAL DATA:  Pleural effusion, respiratory failure and COPD. EXAM: CT CHEST WITH CONTRAST TECHNIQUE: Multidetector CT imaging of the chest was performed during intravenous contrast administration. CONTRAST:  18mL  OMNIPAQUE IOHEXOL 300 MG/ML  SOLN COMPARISON:  Chest radiograph 04/10/2020 FINDINGS: Despite efforts by the technologist and patient, motion artifact is present on today's exam and could not be eliminated. This reduces exam sensitivity and specificity. Cardiovascular: Coronary, aortic arch, and branch vessel atherosclerotic vascular disease. Mild cardiomegaly. Mediastinum/Nodes: Right thyroid lobe enlargement and nodularity. This was biopsied on 09/15/2013. This has been documented on prior studies (ref: J Am Coll Radiol. 2015 Feb;12(2): 143-50).Lower paratracheal lymph node 0.9 cm in short axis on image 57/3. Right internal mammary lymph node versus venous varix measuring 0.7 cm in short axis diameter on image 89/3. Lungs/Pleura: The right pleural effusion has resolved status post thoracentesis. Volume loss and atelectasis in the right lower lobe and to a lesser extent in the right middle lobe. Mild biapical pleuroparenchymal scarring. Hazy peripheral sub solid density in the left upper lobe peripherally for example on image 26/8, nonspecific but possibly reflecting local alveolitis. Upper Abdomen: Perihepatic ascites with abnormal free intraperitoneal fluid within the right upper quadrant ascites. By report the patient had a laparoscopic cholecystectomy with laparoscopic lysis of adhesions on 04/04/2020. However, the amount of right upper quadrant gas in the perihepatic fluid appears increased compared to the interval CT abdomen from 04/08/2020. Also, there is concavity of the right anterior contour of the liver on image 119/3 which was not present previously. Fullness of the left adrenal gland, not completely included on today's exam. Musculoskeletal: Thoracic kyphosis. Probable enchondroma in the right proximal humeral metaphysis. There is a small amount of gas tracking along the right chest wall, possibly procedure related. Old bilateral rib fractures and healing bilateral rib fractures are present. Old sternal  body deformity from a prior fracture. IMPRESSION: 1. Right perihepatic fluid collection with enhancing margins is now indenting the liver margin, concerning for infected fluid collection/abscess. Also there is increasing multilocular gas within this fluid collection, concerning for subdiaphragmatic/perihepatic abscess. 2. The right pleural effusion has resolved status post thoracentesis. 3. Volume loss and atelectasis in the right lower lobe and to a lesser extent in the right middle lobe. 4. Hazy peripheral sub solid density in the left upper lobe peripherally, nonspecific but possibly reflecting local alveolitis. 5. Coronary, aortic arch, and branch vessel atherosclerotic vascular disease. Mild cardiomegaly. 6. Perihepatic ascites with abnormal free intraperitoneal fluid within the right upper quadrant ascites. 7. Old bilateral rib fractures and healing bilateral rib fractures. 8. Stable fullness of the left adrenal gland, not completely included on today's exam. 9. Probable enchondroma in the right proximal humeral metaphysis. 10. Thoracic kyphosis. 11. Aortic atherosclerosis. Aortic Atherosclerosis (ICD10-I70.0). Electronically Signed   By: Gaylyn Rong M.D.   On: 04/11/2020 18:40   CT IMAGE GUIDED DRAINAGE BY PERCUTANEOUS CATHETER  Result Date: 04/12/2020 INDICATION: 86 year old with recent cholecystectomy. Patient has a perihepatic fluid collection containing gas. Request for image guided aspiration and drainage. EXAM: CT GUIDED DRAINAGE OF PERIHEPATIC FLUID COLLECTION MEDICATIONS: Moderate sedation ANESTHESIA/SEDATION: 1.0 mg IV Versed 50 mcg IV Fentanyl Moderate Sedation Time:  20 minutes The patient was continuously  monitored during the procedure by the interventional radiology nurse under my direct supervision. COMPLICATIONS: None immediate. TECHNIQUE: Informed consent was obtained for CT-guided aspiration and drainage. All questions were addressed. Maximal Sterile Barrier Technique was utilized  including caps, mask, sterile gowns, sterile gloves, sterile drape, hand hygiene and skin antiseptic. A timeout was performed prior to the initiation of the procedure. PROCEDURE: Patient was placed supine and CT images through the abdomen were obtained. The perihepatic collection was targeted for sampling. The right side of the abdomen was prepped with chlorhexidine and sterile field was created. Skin and soft tissues were anesthetized with 1% lidocaine. Using CT guidance, 18 gauge trocar needle was directed into the perihepatic collection. Dark red fluid was aspirated. Superstiff Amplatz wire was advanced into the collection. The tract was dilated to accommodate a 10 [place] drain. Approximately 100 mL of dark red fluid was removed from the collection. Follow up CT images were obtained. Drain was secured to skin with suture and attached to a suction bulb. Bandage was placed. FINDINGS: Initial CT images demonstrated a small right pleural effusion. Again noted is large perihepatic fluid collection along the right side of the liver. There is gas within this perihepatic collection. Again noted is gas in the cholecystectomy bed which may be related to surgical material. In addition, there may be another postoperative fluid collection measuring roughly 5.5 x 4.6 cm on sequence 2, image 46 that is caudal to the cholecystectomy bed. This collection appears new from the CT on 04/08/2020. However, it is difficult to differentiate bowel from this gas-filled fluid collection. Drain was successfully advanced into the perihepatic collection. Drain tip extends into the cephalad aspect the collection. Approximately 100 mL of fluid was removed. IMPRESSION: CT-guided aspiration and drainage of the perihepatic fluid collection. The perihepatic fluid is dark red and may represent postoperative hematoma. Concern for new fluid collection along the caudal aspect of the cholecystectomy bed measuring up to 5.5 cm. Recommend repeat CT of the  abdomen and pelvis with IV and oral contrast when the perihepatic drainage decreases to evaluate for residual loculated fluid or separate collection along the caudal aspect of the cholecystectomy bed. Electronically Signed   By: Richarda Overlie M.D.   On: 04/12/2020 14:30   US THORACENTESIS ASP PLEURAL SPACE W/IMG GUIDE  Result Date: 04/11/2020 INDICATION: Respiratory failure. COPD. Right-sided pleural effusion. Request for diagnostic and therapeutic thoracentesis. EXAM: ULTRASOUND GUIDED RIGHT THORACENTESIS MEDICATIONS: None. COMPLICATIONS: None immediate. PROCEDURE: An ultrasound guided thoracentesis was thoroughly discussed with the patient and questions answered. The benefits, risks, alternatives and complications were also discussed. The patient understands and wishes to proceed with the procedure. Written consent was obtained. Ultrasound was performed to localize and mark an adequate pocket of fluid in the right chest. The area was then prepped and draped in the normal sterile fashion. 1% Lidocaine was used for local anesthesia. Under ultrasound guidance a 6 Fr Safe-T-Centesis catheter was introduced. Thoracentesis was performed. The catheter was removed and a dressing applied. FINDINGS: A total of approximately 650 mL of hazy yellow fluid was removed. Samples were sent to the laboratory as requested by the clinical team. IMPRESSION: Successful ultrasound guided right thoracentesis yielding 650 mL of pleural fluid. Read by: Brayton El PA-C Electronically Signed   By: Simonne Come M.D.   On: 04/11/2020 15:07    Labs:  CBC: Recent Labs    04/11/20 0510 04/12/20 0452 04/13/20 0510 04/14/20 0509  WBC 16.9* 23.2* 19.2* 12.4*  HGB 8.9* 9.3* 8.2* 8.4*  HCT 27.5* 28.3* 26.0* 26.8*  PLT 222 217 257 265    COAGS: Recent Labs    01/25/20 0100 01/25/20 0100 04/04/20 0847 04/07/20 1300 04/07/20 2301 04/08/20 0520 04/08/20 1701 04/09/20 0316 04/12/20 0732  INR 1.4*  --  1.1 1.3*  --   --   --    --  1.8*  APTT 28   < >  --  41*   < > 54* 65* 89* 43*   < > = values in this interval not displayed.    BMP: Recent Labs    04/11/20 0510 04/12/20 0452 04/13/20 0510 04/14/20 0509  NA 141 137 137 137  K 3.7 3.2* 3.0* 2.7*  CL 109 104 105 105  CO2 21* 22 22 22   GLUCOSE 125* 106* 94 111*  BUN 24* 22 21 22   CALCIUM 7.8* 7.3* 7.4* 7.3*  CREATININE 0.89 0.72 0.82 0.89  GFRNONAA 59* >60 >60 59*  GFRAA >60 >60 >60 >60    LIVER FUNCTION TESTS: Recent Labs    03/15/20 1038 04/04/20 0847 04/06/20 1831 04/09/20 0316  BILITOT 0.7 0.5 0.9 1.1  AST 13 16 27 21   ALT 9 14 20 19   ALKPHOS 99 87 93 85  PROT 7.1 7.4 7.1 6.3*  ALBUMIN 4.2 3.8 3.4* 2.8*    Assessment and Plan: 86 year-old female with history of atrial fibrillation, diastolic heart failure, COPD, GERD, hypertension, hyperlipidemia, PVD, on Plavix/Eliquis who was recently admitted for cholecystitis and underwent prior cholecystostomy as well as eventual cholecystectomy on 04/04/2020.  Recently readmitted due to lethargy and confusion along with intermittent fevers/ leukocytosis and AKI.  Recent imaging reveals right perihepatic fluid collection with increasing multilocular gas concerning for possible abscess; s/p rt perihepatic fluid coll drain placement 5/12; afebrile; WBC 12.4(19.2), hgb 8.4(8.2), creat nl; K 2.7- replace; drain fluid cx- enterococcus/VRE; antbx per primary team/CCS; cont drain irrigation; once OP < 10-15 cc/day for 2-3 consecutive days or if clinical status worsens obtain f/u CT A/P with IV and oral contrast for further eval/check for any additional fluid coll   Electronically Signed: D. , PA-C 04/14/2020, 11:55 AM   I spent a total of 15 minutes at the the patient's bedside AND on the patient's hospital floor or unit, greater than 50% of which was counseling/coordinating care for perihepatic abscess drain    Patient ID: 06/04/2020, female   DOB: Apr 30, 1931, 86 y.o.   MRN: [id number]

## 2020-04-14 NOTE — Progress Notes (Signed)
[facility] Progress Note     Subjective: CC-  No complaints. Denies abdominal pain, nausea, vomiting. Tolerating diet. BM yesterday. Drain output slightly down, 120cc/24 hr.  Objective: Vital signs in last 24 hours: Temp:  [97.9 F (36.6 C)-98.5 F (36.9 C)] 98.1 F (36.7 C) (05/14 0633) Pulse Rate:  [54-67] 59 (05/14 0633) Resp:  [18-20] 18 (05/14 0633) BP: (134-144)/(63-71) 144/64 (05/14 0633) SpO2:  [98 %-99 %] 98 % (05/14 0633) Last BM Date: 04/12/20  Intake/Output from previous day: 05/13 0701 - 05/14 0700 In: 360 [P.O.:360] Out: 1020 [Urine:900; Drains:120] Intake/Output this shift: No intake/output data recorded.  PE: Gen:  Alert, NAD, pleasant Pulm:  rate and effort normal Abd: Soft, ND, NT, lap incisions cdi, +BS, drain with bloody fluid in bulb  Lab Results:  Recent Labs    04/13/20 0510 04/14/20 0509  WBC 19.2* 12.4*  HGB 8.2* 8.4*  HCT 26.0* 26.8*  PLT 257 265   BMET Recent Labs    04/13/20 0510 04/14/20 0509  NA 137 137  K 3.0* 2.7*  CL 105 105  CO2 22 22  GLUCOSE 94 111*  BUN 21 22  CREATININE 0.82 0.89  CALCIUM 7.4* 7.3*   PT/INR Recent Labs    04/12/20 0732  LABPROT 19.9*  INR 1.8*   CMP     Component Value Date/Time   NA 137 04/14/2020 0509   K 2.7 (LL) 04/14/2020 0509   CL 105 04/14/2020 0509   CO2 22 04/14/2020 0509   GLUCOSE 111 (H) 04/14/2020 0509   BUN 22 04/14/2020 0509   CREATININE 0.89 04/14/2020 0509   CREATININE 0.76 08/28/2015 1025   CALCIUM 7.3 (L) 04/14/2020 0509   PROT 6.3 (L) 04/09/2020 0316   PROT 6.9 05/25/2018 0955   ALBUMIN 2.8 (L) 04/09/2020 0316   ALBUMIN 4.2 05/25/2018 0955   AST 21 04/09/2020 0316   ALT 19 04/09/2020 0316   ALKPHOS 85 04/09/2020 0316   BILITOT 1.1 04/09/2020 0316   BILITOT 0.4 05/25/2018 0955   GFRNONAA 59 (L) 04/14/2020 0509   GFRAA >60 04/14/2020 0509   Lipase     Component Value Date/Time   LIPASE 17 04/06/2020 1831       Studies/Results: CT IMAGE  GUIDED DRAINAGE BY PERCUTANEOUS CATHETER  Result Date: 04/12/2020 INDICATION: 86 year old with recent cholecystectomy. Patient has a perihepatic fluid collection containing gas. Request for image guided aspiration and drainage. EXAM: CT GUIDED DRAINAGE OF PERIHEPATIC FLUID COLLECTION MEDICATIONS: Moderate sedation ANESTHESIA/SEDATION: 1.0 mg IV Versed 50 mcg IV Fentanyl Moderate Sedation Time:  20 minutes The patient was continuously monitored during the procedure by the interventional radiology nurse under my direct supervision. COMPLICATIONS: None immediate. TECHNIQUE: Informed consent was obtained for CT-guided aspiration and drainage. All questions were addressed. Maximal Sterile Barrier Technique was utilized including caps, mask, sterile gowns, sterile gloves, sterile drape, hand hygiene and skin antiseptic. A timeout was performed prior to the initiation of the procedure. PROCEDURE: Patient was placed supine and CT images through the abdomen were obtained. The perihepatic collection was targeted for sampling. The right side of the abdomen was prepped with chlorhexidine and sterile field was created. Skin and soft tissues were anesthetized with 1% lidocaine. Using CT guidance, 18 gauge trocar needle was directed into the perihepatic collection. Dark red fluid was aspirated. Superstiff Amplatz wire was advanced into the collection. The tract was dilated to accommodate a 10 [place] drain. Approximately 100 mL of dark red fluid was removed from the collection. Follow up CT  images were obtained. Drain was secured to skin with suture and attached to a suction bulb. Bandage was placed. FINDINGS: Initial CT images demonstrated a small right pleural effusion. Again noted is large perihepatic fluid collection along the right side of the liver. There is gas within this perihepatic collection. Again noted is gas in the cholecystectomy bed which may be related to surgical material. In addition, there may be another  postoperative fluid collection measuring roughly 5.5 x 4.6 cm on sequence 2, image 46 that is caudal to the cholecystectomy bed. This collection appears new from the CT on 04/08/2020. However, it is difficult to differentiate bowel from this gas-filled fluid collection. Drain was successfully advanced into the perihepatic collection. Drain tip extends into the cephalad aspect the collection. Approximately 100 mL of fluid was removed. IMPRESSION: CT-guided aspiration and drainage of the perihepatic fluid collection. The perihepatic fluid is dark red and may represent postoperative hematoma. Concern for new fluid collection along the caudal aspect of the cholecystectomy bed measuring up to 5.5 cm. Recommend repeat CT of the abdomen and pelvis with IV and oral contrast when the perihepatic drainage decreases to evaluate for residual loculated fluid or separate collection along the caudal aspect of the cholecystectomy bed. Electronically Signed   By: Richarda Overlie M.D.   On: 04/12/2020 14:30    Anti-infectives: Anti-infectives (From admission, onward)   Start     Dose/Rate Route Frequency Ordered Stop   04/13/20 1000  anidulafungin (ERAXIS) 100 mg in sodium chloride 0.9 % 100 mL IVPB     100 mg 78 mL/hr over 100 Minutes Intravenous Every 24 hours 04/12/20 0734     04/12/20 1000  anidulafungin (ERAXIS) 200 mg in sodium chloride 0.9 % 200 mL IVPB     200 mg 78 mL/hr over 200 Minutes Intravenous  Once 04/12/20 0734 04/12/20 1516   04/12/20 0815  anidulafungin (ERAXIS) 100 mg in sodium chloride 0.9 % 100 mL IVPB  Status:  Discontinued     100 mg 78 mL/hr over 100 Minutes Intravenous Every 24 hours 04/12/20 0721 04/12/20 0734   04/12/20 0800  piperacillin-tazobactam (ZOSYN) IVPB 3.375 g     3.375 g 12.5 mL/hr over 240 Minutes Intravenous Every 8 hours 04/12/20 0718     04/11/20 2000  metroNIDAZOLE (FLAGYL) IVPB 500 mg  Status:  Discontinued     500 mg 100 mL/hr over 60 Minutes Intravenous Every 8 hours  04/11/20 1859 04/12/20 0718   04/11/20 0800  vancomycin (VANCOREADY) IVPB 1250 mg/250 mL  Status:  Discontinued     1,250 mg 166.7 mL/hr over 90 Minutes Intravenous Every 24 hours 04/10/20 1602 04/12/20 1048   04/10/20 2200  ceFEPIme (MAXIPIME) 2 g in sodium chloride 0.9 % 100 mL IVPB  Status:  Discontinued     2 g 200 mL/hr over 30 Minutes Intravenous Every 12 hours 04/10/20 1552 04/12/20 0718   04/10/20 1400  vancomycin (VANCOCIN) IVPB 1000 mg/200 mL premix     1,000 mg 200 mL/hr over 60 Minutes Intravenous  Once 04/10/20 1256 04/10/20 1451   04/10/20 1400  piperacillin-tazobactam (ZOSYN) IVPB 3.375 g     3.375 g 100 mL/hr over 30 Minutes Intravenous  Once 04/10/20 1256 04/10/20 1417       Assessment/Plan HTN HLD A fib on eliquis(LD5/11) PVD on plavix(LD5/11) COPD Hypokalemia -being replaced Anemia AKI Acute hypoxic hypercarbic respiratory failure, s/p right thoracentesis 5/10  S/p CT-guided cholecystostomy tube placement 12/30/2019, Dr. Richarda Overlie S/p laparoscopic cholecystectomy 04/04/20 Dr. Janee Morn -  POD #10 - CT 5/8 without surgical complications, LFTs WNL -CT chest 5/11: Right perihepatic fluid collection with enhancing margins now indenting the liver margins concerning for infected fluid/abscess/perihepatic abscess. - IR  drain placement5/12, looks like old hematoma, culture pending  ID -Maxipime 5/11-5/12; flagyl 5/11-5/12; Zosyn 5/12 >>; anidulafungin 5/12 >>  VTE -SCDs, eliquis/plavix FEN - Heart healthy Foley -none Follow up -Dr. Grandville Silos  Plan:  Continue drain, plan repeat CT scan once output down. Continue antibiotics and follow culture.   LOS: 7 days    Wellington Hampshire, [facility] Surgery 04/14/2020, 9:28 AM Please see Amion for pager number during day hours 7:00am-4:30pm

## 2020-04-15 LAB — BASIC METABOLIC PANEL
Anion gap: 9 (ref 5–15)
BUN: 20 mg/dL (ref 8–23)
CO2: 24 mmol/L (ref 22–32)
Calcium: 7.7 mg/dL — ABNORMAL LOW (ref 8.9–10.3)
Chloride: 104 mmol/L (ref 98–111)
Creatinine, Ser: 0.68 mg/dL (ref 0.44–1.00)
GFR calc Af Amer: >60 mL/min (ref >60)
GFR calc non Af Amer: >60 mL/min (ref >60)
Glucose, Bld: 108 mg/dL — ABNORMAL HIGH (ref 70–99)
Potassium: 3.4 mmol/L — ABNORMAL LOW (ref 3.5–5.1)
Sodium: 137 mmol/L (ref 135–145)

## 2020-04-15 LAB — CBC WITH DIFFERENTIAL/PLATELET
Abs Immature Granulocytes: 0.14 10*3/uL — ABNORMAL HIGH (ref 0.00–0.07)
Basophils Absolute: 0 10*3/uL (ref 0.0–0.1)
Basophils Relative: 0 %
Eosinophils Absolute: 0.2 10*3/uL (ref 0.0–0.5)
Eosinophils Relative: 2 %
HCT: 26.5 % — ABNORMAL LOW (ref 36.0–46.0)
Hemoglobin: 8.5 g/dL — ABNORMAL LOW (ref 12.0–15.0)
Immature Granulocytes: 1 %
Lymphocytes Relative: 14 %
Lymphs Abs: 1.7 10*3/uL (ref 0.7–4.0)
MCH: 27.2 pg (ref 26.0–34.0)
MCHC: 32.1 g/dL (ref 30.0–36.0)
MCV: 84.7 fL (ref 80.0–100.0)
Monocytes Absolute: 1.4 10*3/uL — ABNORMAL HIGH (ref 0.1–1.0)
Monocytes Relative: 11 %
Neutro Abs: 8.9 10*3/uL — ABNORMAL HIGH (ref 1.7–7.7)
Neutrophils Relative %: 72 %
Platelets: 365 10*3/uL (ref 150–400)
RBC: 3.13 MIL/uL — ABNORMAL LOW (ref 3.87–5.11)
RDW: 14.9 % (ref 11.5–15.5)
WBC: 12.4 10*3/uL — ABNORMAL HIGH (ref 4.0–10.5)
nRBC: 0 % (ref 0.0–0.2)

## 2020-04-15 LAB — CULTURE, BLOOD (ROUTINE X 2)
Culture: NO GROWTH
Culture: NO GROWTH
Special Requests: ADEQUATE

## 2020-04-15 NOTE — Progress Notes (Signed)
[facility] Progress Note     Subjective: CC-  Tolerating diet.  Drain output stable.  No fevers.  WBCs down significantly, but stalled since yesterday.    Objective: Vital signs in last 24 hours: Temp:  [97.9 F (36.6 C)-98.7 F (37.1 C)] 98.1 F (36.7 C) (05/15 0548) Pulse Rate:  [63-80] 69 (05/15 0548) Resp:  [19-20] 19 (05/15 0548) BP: (128-134)/(62-84) 132/62 (05/15 0548) SpO2:  [95 %-97 %] 95 % (05/15 0548) Last BM Date: 04/14/20  Intake/Output from previous day: 05/14 0701 - 05/15 0700 In: 1181 [P.O.:670; I.V.:6; IV Piggyback:500] Out: 945 [Urine:775; Drains:170] Intake/Output this shift: No intake/output data recorded.  PE: Gen:  Alert, NAD, pleasant Pulm:  rate and effort normal Abd: Soft, ND, NT, lap incisions cdi, drain with murky brown fluid in bulb  Lab Results:  Recent Labs    04/14/20 0509 04/15/20 0507  WBC 12.4* 12.4*  HGB 8.4* 8.5*  HCT 26.8* 26.5*  PLT 265 365   BMET Recent Labs    04/14/20 0509 04/15/20 0507  NA 137 137  K 2.7* 3.4*  CL 105 104  CO2 22 24  GLUCOSE 111* 108*  BUN 22 20  CREATININE 0.89 0.68  CALCIUM 7.3* 7.7*   PT/INR No results for input(s): LABPROT, INR in the last 72 hours. CMP     Component Value Date/Time   NA 137 04/15/2020 0507   K 3.4 (L) 04/15/2020 0507   CL 104 04/15/2020 0507   CO2 24 04/15/2020 0507   GLUCOSE 108 (H) 04/15/2020 0507   BUN 20 04/15/2020 0507   CREATININE 0.68 04/15/2020 0507   CREATININE 0.76 08/28/2015 1025   CALCIUM 7.7 (L) 04/15/2020 0507   PROT 6.3 (L) 04/09/2020 0316   PROT 6.9 05/25/2018 0955   ALBUMIN 2.8 (L) 04/09/2020 0316   ALBUMIN 4.2 05/25/2018 0955   AST 21 04/09/2020 0316   ALT 19 04/09/2020 0316   ALKPHOS 85 04/09/2020 0316   BILITOT 1.1 04/09/2020 0316   BILITOT 0.4 05/25/2018 0955   GFRNONAA >60 04/15/2020 0507   GFRAA >60 04/15/2020 0507   Lipase     Component Value Date/Time   LIPASE 17 04/06/2020 1831       Studies/Results: No  results found.  Anti-infectives: Anti-infectives (From admission, onward)   Start     Dose/Rate Route Frequency Ordered Stop   04/14/20 1800  linezolid (ZYVOX) tablet 600 mg     600 mg Oral Every 12 hours 04/14/20 1556     04/13/20 1000  anidulafungin (ERAXIS) 100 mg in sodium chloride 0.9 % 100 mL IVPB  Status:  Discontinued     100 mg 78 mL/hr over 100 Minutes Intravenous Every 24 hours 04/12/20 0734 04/14/20 0947   04/12/20 1000  anidulafungin (ERAXIS) 200 mg in sodium chloride 0.9 % 200 mL IVPB     200 mg 78 mL/hr over 200 Minutes Intravenous  Once 04/12/20 0734 04/12/20 1516   04/12/20 0815  anidulafungin (ERAXIS) 100 mg in sodium chloride 0.9 % 100 mL IVPB  Status:  Discontinued     100 mg 78 mL/hr over 100 Minutes Intravenous Every 24 hours 04/12/20 0721 04/12/20 0734   04/12/20 0800  piperacillin-tazobactam (ZOSYN) IVPB 3.375 g     3.375 g 12.5 mL/hr over 240 Minutes Intravenous Every 8 hours 04/12/20 0718     04/11/20 2000  metroNIDAZOLE (FLAGYL) IVPB 500 mg  Status:  Discontinued     500 mg 100 mL/hr over 60 Minutes Intravenous Every 8  hours 04/11/20 1859 04/12/20 0718   04/11/20 0800  vancomycin (VANCOREADY) IVPB 1250 mg/250 mL  Status:  Discontinued     1,250 mg 166.7 mL/hr over 90 Minutes Intravenous Every 24 hours 04/10/20 1602 04/12/20 1048   04/10/20 2200  ceFEPIme (MAXIPIME) 2 g in sodium chloride 0.9 % 100 mL IVPB  Status:  Discontinued     2 g 200 mL/hr over 30 Minutes Intravenous Every 12 hours 04/10/20 1552 04/12/20 0718   04/10/20 1400  vancomycin (VANCOCIN) IVPB 1000 mg/200 mL premix     1,000 mg 200 mL/hr over 60 Minutes Intravenous  Once 04/10/20 1256 04/10/20 1451   04/10/20 1400  piperacillin-tazobactam (ZOSYN) IVPB 3.375 g     3.375 g 100 mL/hr over 30 Minutes Intravenous  Once 04/10/20 1256 04/10/20 1417       Assessment/Plan HTN HLD A fib on eliquis(LD5/11) PVD on plavix(LD5/11) COPD Hypokalemia -being replaced Anemia AKI Acute hypoxic  hypercarbic respiratory failure, s/p right thoracentesis 5/10  S/p CT-guided cholecystostomy tube placement 12/30/2019, Dr. Markus Daft S/p laparoscopic cholecystectomy 04/04/20 Dr. Grandville Silos - POD #11 - CT 5/8 without surgical complications, LFTs WNL -CT chest 5/11: Right perihepatic fluid collection with enhancing margins now indenting the liver margins concerning for infected fluid/abscess/perihepatic abscess. - IR  drain placement5/12, looks like old hematoma, VRE.    ID -Maxipime 5/11-5/12; flagyl 5/11-5/12; Zosyn 5/12 >>; anidulafungin 5/12 -5/14; linezolid 5/14>> VTE -SCDs, eliquis/plavix FEN - Heart healthy Foley -none Follow up -Dr. Grandville Silos  Plan:  Continue drain, plan repeat CT scan once output down. Continue antibiotics   LOS: 8 days     Milus Height, MD FACS Surgical Oncology, General Surgery, Trauma and [facility], [place] for weekday/non holidays Check [email or web address] for coverage night/weekend/holidays  Do not use SecureChat as it is not reliable for timely patient care.

## 2020-04-15 NOTE — Progress Notes (Signed)
PROGRESS NOTE    Cheryl Atkinson  QIW:[id number] DOB: [birth year] DOA: 04/06/2020 PCP: Shelva Majestic, MD   Brief Narrative: Patient is a 86 year old female with history of persistent A. fib, chronic diastolic CHF, COPD, GERD, hypertension, hyperlipidemia, peripheral vascular disease who was recently admitted here for cholecystitis and underwent laparoscopic cholecystectomy.  She was discharged from here on 04/05/2020 and prescribed tramadol for pain.  Patient was brought back to the emerge department by family because of concern of lethargy, confusion.  He saturated 87% on room air, had to be placed on supplemental oxygen and eventually on BiPAP due to decreased mentation, somnolence.  ABG showed hypercarbia.  She was also febrile on presentation with leukocytosis.  Found to have AKI.  Chest x-ray did not show any acute abnormality.  Also appeared very dehydrated on presentation. She had to be started on Narcan as needed for opioid toxicity and was given IV fluids. Overall condition improved.  Currently she is maintaining her saturation on room air but had episodes of tachypnea.  Chest x-ray done on 04/10/2020 showed right pleural effusion,underwent thoracentesis with removal of 650 ml of fluid. Hospital course remarkable for persistent fever, worsening leukocytosis.  CT chest with contrast showed perihepatic/subdiaphragmatic abscess.  Antibiotics coverage broadened.  General surgery following again.  IR abscess drainage 5/12, on Zosyn. PT has recommended skilled nursing facility.  Assessment & Plan:  Principal Problem:   Acute respiratory failure with hypoxia (HCC) Active Problems:   COPD (chronic obstructive pulmonary disease) (HCC)   GERD   Insulin resistance   DNR (do not resuscitate)   Memory loss   BPPV (benign paroxysmal positional vertigo)   Hypokalemia   Insomnia   S/P laparoscopic cholecystectomy   Acute respiratory failure (HCC)   Encephalopathy   AKI (acute kidney injury)  (HCC)   Postoperative hematoma involving digestive system following digestive system procedure  Perihepatic/subdiaphragmatic abscess:She had persistent fever, worsening leukocytosis.  CT chest with contrast showed perihepatic/subdiaphragmatic abscess.  Antibiotics coverage broadened.  General surgery following again.  IR consulted for abscess drainage done 5/12 and abx broadened to Zosyn. Added Linezolid 5/15 to cover Enterococcus growing from drain culture. IR recommends repeat CT with contrast after drainage tapers off, to check for further loculations, etc.  Follow cx data, culture obtained 5/12.  Acute hypoxic hypercarbic respiratory failure: On presentation.Most likely secondary to respiratory depression secondary to tramadol overuse.  She was just discharged from here after cholecystectomy and was prescribed tramadol for pain.  Chest x-ray does not show any acute findings.  She was hypoxic on room air and had to be put on BiPAP on admission.  ABG showed hypercarbia with CO2 of 68.  Mentation  improved after she was  placed on BiPAP and with  Narcan. Hold any opiates or sedating medications.   CT head did not show any acute intracranial abnormalities. Mental and respiratory status improved and she has been maintaining her saturation on room air.  Off BiPAP. Her home Lasix has been restarted.Chest x-ray done on 04/10/2020 showed right pleural effusion.  Underwent  ultrasound-guided thoracentesis of the right chest.  AKI: Resolved with IV fluids.  Renal ultrasound did not show any acute findings. Foley was placed on admission which has been discontinued.  Acute cholecystitis status post laparoscopic cholecystectomy on 04/04/2020: Presented with significant leukocytosis, fever of 100 F.  Noted hypotension on arrival, lactic cidosis.  General surgery following.   CT abdomen/pelvis done here did not show complication of recent surgery. But CT chest showed perihepatic/subdiapshragmatic  abscess being  managed as above.  Permanent A. fib: Currently rate is controlled.  On Eliquis for anticoagulation.  Not on any rate control agents.  Eliquis was held for drain placement and has been resumed.  Hypokalemia: Being supplemented.  Peripheral vascular disease: On Plavix, Lipitor.    COPD: Currently stable.  No signs of acute exacerbation.  Continue bronchodilators as needed.  Hypertension: On ARB at home which is which has been resumed.  Continue to monitor.   Debility/deconditioning: PT recommended skilled nursing facility.SW consulted and following.     DVT prophylaxis: Eliquis Code Status: DNR Family Communication: Called and discussed with  Son on phone on 04/11/20 Status is: inaptient  Dispo: The patient is from: Home              Anticipated d/c is to:SNF              Anticipated d/c date is:2-3 days              Patient currently is not medically stable to d/c.   Needs final cultures and repeat CT scan prior to discharge.  Consultants: Surgery, IR  Procedures:None  Antimicrobials:  Anti-infectives (From admission, onward)   Start     Dose/Rate Route Frequency Ordered Stop   04/14/20 1800  linezolid (ZYVOX) tablet 600 mg     600 mg Oral Every 12 hours 04/14/20 1556     04/13/20 1000  anidulafungin (ERAXIS) 100 mg in sodium chloride 0.9 % 100 mL IVPB  Status:  Discontinued     100 mg 78 mL/hr over 100 Minutes Intravenous Every 24 hours 04/12/20 0734 04/14/20 0947   04/12/20 1000  anidulafungin (ERAXIS) 200 mg in sodium chloride 0.9 % 200 mL IVPB     200 mg 78 mL/hr over 200 Minutes Intravenous  Once 04/12/20 0734 04/12/20 1516   04/12/20 0815  anidulafungin (ERAXIS) 100 mg in sodium chloride 0.9 % 100 mL IVPB  Status:  Discontinued     100 mg 78 mL/hr over 100 Minutes Intravenous Every 24 hours 04/12/20 0721 04/12/20 0734   04/12/20 0800  piperacillin-tazobactam (ZOSYN) IVPB 3.375 g     3.375 g 12.5 mL/hr over 240 Minutes Intravenous Every 8 hours 04/12/20 0718      04/11/20 2000  metroNIDAZOLE (FLAGYL) IVPB 500 mg  Status:  Discontinued     500 mg 100 mL/hr over 60 Minutes Intravenous Every 8 hours 04/11/20 1859 04/12/20 0718   04/11/20 0800  vancomycin (VANCOREADY) IVPB 1250 mg/250 mL  Status:  Discontinued     1,250 mg 166.7 mL/hr over 90 Minutes Intravenous Every 24 hours 04/10/20 1602 04/12/20 1048   04/10/20 2200  ceFEPIme (MAXIPIME) 2 g in sodium chloride 0.9 % 100 mL IVPB  Status:  Discontinued     2 g 200 mL/hr over 30 Minutes Intravenous Every 12 hours 04/10/20 1552 04/12/20 0718   04/10/20 1400  vancomycin (VANCOCIN) IVPB 1000 mg/200 mL premix     1,000 mg 200 mL/hr over 60 Minutes Intravenous  Once 04/10/20 1256 04/10/20 1451   04/10/20 1400  piperacillin-tazobactam (ZOSYN) IVPB 3.375 g     3.375 g 100 mL/hr over 30 Minutes Intravenous  Once 04/10/20 1256 04/10/20 1417      Subjective: Patient seen and examined at the bedside this morning.  Hemodynamically stable, has no complaints. She now has an appetite and is feeling better.  Objective: Vitals:   04/14/20 0633 04/14/20 1434 04/14/20 2102 04/15/20 0548  BP: (!) 144/64 128/84 134/65  132/62  Pulse: (!) 59 80 63 69  Resp: 18  20 19   Temp: 98.1 F (36.7 C) 97.9 F (36.6 C) 98.7 F (37.1 C) 98.1 F (36.7 C)  TempSrc: Oral Oral    SpO2: 98% 97% 95% 95%  Weight:      Height:        Intake/Output Summary (Last 24 hours) at 04/15/2020 0820 Last data filed at 04/15/2020 04/17/2020 Gross per 24 hour  Intake 1181 ml  Output 945 ml  Net 236 ml   Filed Weights   04/07/20 1621  Weight: 64.5 kg    Examination:  General exam: Elderly debilitated female, comfortable  Respiratory system: Diminished air sound on the right side , R chest JP drain with scant bloody material (was just emptied per patient) cardiovascular system: Irregularly irregular rhythm. No JVD, murmurs, rubs, gallops or clicks. Gastrointestinal system: Abdomen is nondistended, soft and has very mild tenderness on the  right subcostal region. No organomegaly or masses felt. Normal bowel sounds heard. Central nervous system: Alert and oriented. No focal neurological deficits. Extremities: No edema, no clubbing ,no cyanosis, distal peripheral pulses palpable. Skin: No rashes, lesions or ulcers,no icterus ,no pallor   Data Reviewed: I have personally reviewed following labs and imaging studies  CBC: Recent Labs  Lab 04/11/20 0510 04/12/20 0452 04/13/20 0510 04/14/20 0509 04/15/20 0507  WBC 16.9* 23.2* 19.2* 12.4* 12.4*  NEUTROABS 14.1* 20.4* 16.7* 9.6* 8.9*  HGB 8.9* 9.3* 8.2* 8.4* 8.5*  HCT 27.5* 28.3* 26.0* 26.8* 26.5*  MCV 85.4 83.7 86.7 87.0 84.7  PLT 222 217 257 265 365   Basic Metabolic Panel: Recent Labs  Lab 04/11/20 0510 04/12/20 0452 04/13/20 0510 04/14/20 0509 04/15/20 0507  NA 141 137 137 137 137  K 3.7 3.2* 3.0* 2.7* 3.4*  CL 109 104 105 105 104  CO2 21* 22 22 22 24   GLUCOSE 125* 106* 94 111* 108*  BUN 24* 22 21 22 20   CREATININE 0.89 0.72 0.82 0.89 0.68  CALCIUM 7.8* 7.3* 7.4* 7.3* 7.7*   GFR: Estimated Creatinine Clearance: 47.3 mL/min (by C-G formula based on SCr of 0.68 mg/dL). Liver Function Tests: Recent Labs  Lab 04/09/20 0316  AST 21  ALT 19  ALKPHOS 85  BILITOT 1.1  PROT 6.3*  ALBUMIN 2.8*   No results for input(s): LIPASE, AMYLASE in the last 168 hours. No results for input(s): AMMONIA in the last 168 hours. Coagulation Profile: Recent Labs  Lab 04/12/20 0732  INR 1.8*   Cardiac Enzymes: No results for input(s): CKTOTAL, CKMB, CKMBINDEX, TROPONINI in the last 168 hours. BNP (last 3 results) Recent Labs    02/11/20 0936  PROBNP 228.0*   HbA1C: No results for input(s): HGBA1C in the last 72 hours. CBG: Recent Labs  Lab 04/08/20 1939  GLUCAP 108*   Lipid Profile: No results for input(s): CHOL, HDL, LDLCALC, TRIG, CHOLHDL, LDLDIRECT in the last 72 hours. Thyroid Function Tests: No results for input(s): TSH, T4TOTAL, FREET4, T3FREE,  THYROIDAB in the last 72 hours. Anemia Panel: No results for input(s): VITAMINB12, FOLATE, FERRITIN, TIBC, IRON, RETICCTPCT in the last 72 hours. Sepsis Labs: No results for input(s): PROCALCITON, LATICACIDVEN in the last 168 hours.  Recent Results (from the past 240 hour(s))  Respiratory Panel by RT PCR (Flu A&B, Covid) - Nasopharyngeal Swab     Status: None   Collection Time: 04/06/20  6:37 PM   Specimen: Nasopharyngeal Swab  Result Value Ref Range Status   SARS Coronavirus 2 by  RT PCR NEGATIVE NEGATIVE Final    Comment: (NOTE) SARS-CoV-2 target nucleic acids are NOT DETECTED. The SARS-CoV-2 RNA is generally detectable in upper respiratoy specimens during the acute phase of infection. The lowest concentration of SARS-CoV-2 viral copies this assay can detect is 131 copies/mL. A negative result does not preclude SARS-Cov-2 infection and should not be used as the sole basis for treatment or other patient management decisions. A negative result may occur with  improper specimen collection/handling, submission of specimen other than nasopharyngeal swab, presence of viral mutation(s) within the areas targeted by this assay, and inadequate number of viral copies (<131 copies/mL). A negative result must be combined with clinical observations, patient history, and epidemiological information. The expected result is Negative. Fact Sheet for Patients:  https://www.moore.com/https://www.fda.gov/media/142436/download Fact Sheet for Healthcare Providers:  https://www.young.biz/https://www.fda.gov/media/142435/download This test is not yet ap proved or cleared by the [place] FDA and  has been authorized for detection and/or diagnosis of SARS-CoV-2 by FDA under an Emergency Use Authorization (EUA). This EUA will remain  in effect (meaning this test can be used) for the duration of the COVID-19 declaration under Section 564(b)(1) of the Act, 21 U.S.C. section 360bbb-3(b)(1), unless the authorization is terminated or revoked  sooner.    Influenza A by PCR NEGATIVE NEGATIVE Final   Influenza B by PCR NEGATIVE NEGATIVE Final    Comment: (NOTE) The Xpert Xpress SARS-CoV-2/FLU/RSV assay is intended as an aid in  the diagnosis of influenza from Nasopharyngeal swab specimens and  should not be used as a sole basis for treatment. Nasal washings and  aspirates are unacceptable for Xpert Xpress SARS-CoV-2/FLU/RSV  testing. Fact Sheet for Patients: https://www.moore.com/https://www.fda.gov/media/142436/download Fact Sheet for Healthcare Providers: https://www.young.biz/https://www.fda.gov/media/142435/download This test is not yet approved or cleared by the [place] FDA and  has been authorized for detection and/or diagnosis of SARS-CoV-2 by  FDA under an Emergency Use Authorization (EUA). This EUA will remain  in effect (meaning this test can be used) for the duration of the  Covid-19 declaration under Section 564(b)(1) of the Act, 21  U.S.C. section 360bbb-3(b)(1), unless the authorization is  terminated or revoked. Performed at [facility], [address] [place], [place] [address]   MRSA PCR Screening     Status: None   Collection Time: 04/07/20  4:02 PM   Specimen: Nasal Mucosa; Nasopharyngeal  Result Value Ref Range Status   MRSA by PCR NEGATIVE NEGATIVE Final    Comment:        The GeneXpert MRSA Assay (FDA approved for NASAL specimens only), is one component of a comprehensive MRSA colonization surveillance program. It is not intended to diagnose MRSA infection nor to guide or monitor treatment for MRSA infections. Performed at [facility], [address] [place], [place] [address]   Culture, blood (routine x 2)     Status: None   Collection Time: 04/10/20  1:08 PM   Specimen: BLOOD LEFT HAND  Result Value Ref Range Status   Specimen Description   Final    BLOOD LEFT HAND Performed at [facility], [address] [place], [place] [address]    Special Requests   Final     BOTTLES DRAWN AEROBIC ONLY Blood Culture results may not be optimal due to an inadequate volume of blood received in culture bottles Performed at [facility], [address] [place], [place] [address]    Culture   Final    NO GROWTH 5 DAYS Performed at [facility], [place] [address], [place], [place] [address]    Report Status 04/15/2020 FINAL  Final  Culture, blood (routine x 2)     Status: None   Collection Time: 04/10/20  1:08 PM   Specimen: BLOOD  Result Value Ref Range Status   Specimen Description   Final    BLOOD RIGHT ANTECUBITAL Performed at [place] [address] [place], [place] [address]    Special Requests   Final    BOTTLES DRAWN AEROBIC AND ANAEROBIC Blood Culture adequate volume Performed at [place] [address], [place], [place] [address]    Culture   Final    NO GROWTH 5 DAYS Performed at [facility], [place] [address], [place], [place] [address]    Report Status 04/15/2020 FINAL  Final  Culture, body fluid-bottle     Status: None (Preliminary result)   Collection Time: 04/11/20  3:08 PM   Specimen: Fluid  Result Value Ref Range Status   Specimen Description PENDING  Incomplete   Special Requests PENDING  Incomplete   Culture   Final    NO GROWTH 4 DAYS Performed at [facility] [address], [place], [place] [address]    Report Status PENDING  Incomplete  Gram stain     Status: None   Collection Time: 04/11/20  3:08 PM   Specimen: Fluid  Result Value Ref Range Status   Specimen Description FLUID PLEURAL  Final   Special Requests NONE  Final   Gram Stain   Final    WBC PRESENT,BOTH PMN AND MONONUCLEAR NO ORGANISMS SEEN CYTOSPIN SMEAR Performed at [facility], [address] [place], [place] [address]    Report Status 04/11/2020 FINAL  Final  Culture, body fluid-bottle     Status: None (Preliminary result)   Collection Time: 04/11/20  3:08 PM    Specimen: Fluid  Result Value Ref Range Status   Specimen Description FLUID PLEURAL  Final   Special Requests BOTTLES DRAWN AEROBIC AND ANAEROBIC  Final   Culture   Final    NO GROWTH 4 DAYS Performed at [facility], [place] [address], [place], [place] [address]    Report Status PENDING  Incomplete  Gram stain     Status: None   Collection Time: 04/11/20  3:08 PM   Specimen: Fluid  Result Value Ref Range Status   Specimen Description FLUID PLEURAL  Final   Special Requests NONE  Final   Gram Stain   Final    WBC PRESENT,BOTH PMN AND MONONUCLEAR NO ORGANISMS SEEN CYTOSPIN SMEAR Performed at [facility], [address] [place], [place] [address]    Report Status 04/12/2020 FINAL  Final  Aerobic/Anaerobic Culture (surgical/deep wound)     Status: None (Preliminary result)   Collection Time: 04/12/20  1:40 PM   Specimen: Abdominal Fluid  Result Value Ref Range Status   Specimen Description   Final    FLUID ABDOMEN Performed at [facility] [address], [place], [place] [address]    Special Requests   Final    PERIHEPATIC FLUID Performed at [facility], [place] [address], [place], [place] [address]    Gram Stain   Final    ABUNDANT WBC PRESENT, PREDOMINANTLY PMN RARE GRAM POSITIVE COCCI Performed at [facility], [place] [address] [place], [place] [address]    Culture   Final  FEW ENTEROCOCCUS FAECIUM VANCOMYCIN RESISTANT ENTEROCOCCUS ISOLATED NO ANAEROBES ISOLATED; CULTURE IN PROGRESS FOR 5 DAYS    Report Status PENDING  Incomplete   Organism ID, Bacteria ENTEROCOCCUS FAECIUM  Final      Susceptibility   Enterococcus faecium - MIC*    AMPICILLIN >=32 RESISTANT Resistant     VANCOMYCIN >=32 RESISTANT Resistant     GENTAMICIN SYNERGY SENSITIVE Sensitive     LINEZOLID 2 SENSITIVE Sensitive     * FEW ENTEROCOCCUS FAECIUM    Radiology Studies: No results found. Scheduled Meds: . apixaban  5 mg Oral BID  .  atorvastatin  40 mg Oral Daily  . clopidogrel  75 mg Oral Daily  . feeding supplement (ENSURE ENLIVE)  237 mL Oral BID BM  . furosemide  20 mg Oral Daily  . irbesartan  300 mg Oral Daily  . linezolid  600 mg Oral Q12H  . polyethylene glycol  17 g Oral BID  . sodium chloride flush  5 mL Intracatheter Q8H   Continuous Infusions: . sodium chloride 250 mL (04/10/20 1350)  . piperacillin-tazobactam (ZOSYN)  IV 3.375 g (04/15/20 0741)   Time spent: 23 mins.More than 50% of that time was spent in counseling and/or coordination of care.  Sula Fetterly Vergie Living, MD [facility] P5/15/2021, 8:20 AM

## 2020-04-15 NOTE — Progress Notes (Signed)
Palliative Care Follow-up Note  Patient with drain in place for perihepatic abscess. She is awake and conversant. Plan to dc to SNF. Patient expressed hesitation about SNF but her goal is to regain her strength and return home as soon as possible. She has in home caregiver and lives with her son. She is followed by [facility] at home- I have communicated with their provider and they can assist her with transition home from SNF - also recommend palliative care follow at SNF- place AMB PALLIATIVE REFERRAL at time of discharge and include on dc summary. She is having mild pain around drain site- discussed using Tylenol for pain and provided reassurance. Will follow as needed- care plan in place. MOST Form on file.  Anderson [place], DO Palliative Medicine  Time: 20 minutes Greater than 50%  of this time was spent counseling and coordinating care related to the above assessment and plan.

## 2020-04-16 LAB — CBC WITH DIFFERENTIAL/PLATELET
Abs Immature Granulocytes: 0.22 10*3/uL — ABNORMAL HIGH (ref 0.00–0.07)
Basophils Absolute: 0 10*3/uL (ref 0.0–0.1)
Basophils Relative: 0 %
Eosinophils Absolute: 0.2 10*3/uL (ref 0.0–0.5)
Eosinophils Relative: 2 %
HCT: 27.2 % — ABNORMAL LOW (ref 36.0–46.0)
Hemoglobin: 8.6 g/dL — ABNORMAL LOW (ref 12.0–15.0)
Immature Granulocytes: 2 %
Lymphocytes Relative: 14 %
Lymphs Abs: 1.7 10*3/uL (ref 0.7–4.0)
MCH: 27.1 pg (ref 26.0–34.0)
MCHC: 31.6 g/dL (ref 30.0–36.0)
MCV: 85.8 fL (ref 80.0–100.0)
Monocytes Absolute: 1.1 10*3/uL — ABNORMAL HIGH (ref 0.1–1.0)
Monocytes Relative: 10 %
Neutro Abs: 8.5 10*3/uL — ABNORMAL HIGH (ref 1.7–7.7)
Neutrophils Relative %: 72 %
Platelets: 397 10*3/uL (ref 150–400)
RBC: 3.17 MIL/uL — ABNORMAL LOW (ref 3.87–5.11)
RDW: 14.7 % (ref 11.5–15.5)
WBC: 11.7 10*3/uL — ABNORMAL HIGH (ref 4.0–10.5)
nRBC: 0 % (ref 0.0–0.2)

## 2020-04-16 LAB — BASIC METABOLIC PANEL
Anion gap: 10 (ref 5–15)
BUN: 18 mg/dL (ref 8–23)
CO2: 26 mmol/L (ref 22–32)
Calcium: 8.1 mg/dL — ABNORMAL LOW (ref 8.9–10.3)
Chloride: 101 mmol/L (ref 98–111)
Creatinine, Ser: 0.77 mg/dL (ref 0.44–1.00)
GFR calc Af Amer: >60 mL/min (ref >60)
GFR calc non Af Amer: >60 mL/min (ref >60)
Glucose, Bld: 170 mg/dL — ABNORMAL HIGH (ref 70–99)
Potassium: 3.5 mmol/L (ref 3.5–5.1)
Sodium: 137 mmol/L (ref 135–145)

## 2020-04-16 LAB — CULTURE, BODY FLUID W GRAM STAIN -BOTTLE: Culture: NO GROWTH

## 2020-04-16 NOTE — Progress Notes (Signed)
PROGRESS NOTE    Cheryl ReeseBetty L Atkinson  KVQ:[id number] DOB: 1931-05-09 DOA: 04/06/2020 PCP: Shelva MajesticHunter, Stephen O, MD   Brief Narrative: Patient is a 86 year old female with history of persistent A. fib, chronic diastolic CHF, COPD, GERD, hypertension, hyperlipidemia, peripheral vascular disease who was recently admitted here for cholecystitis and underwent laparoscopic cholecystectomy.  She was discharged from here on 04/05/2020 and prescribed tramadol for pain.  Patient was brought back to the emerge department by family because of concern of lethargy, confusion.  He saturated 87% on room air, had to be placed on supplemental oxygen and eventually on BiPAP due to decreased mentation, somnolence.  ABG showed hypercarbia.  She was also febrile on presentation with leukocytosis.  Found to have AKI.  Chest x-ray did not show any acute abnormality.  Also appeared very dehydrated on presentation. She had to be started on Narcan as needed for opioid toxicity and was given IV fluids. Overall condition improved.  Currently she is maintaining her saturation on room air but had episodes of tachypnea.  Chest x-ray done on 04/10/2020 showed right pleural effusion,underwent thoracentesis with removal of 650 ml of fluid. Hospital course remarkable for persistent fever, worsening leukocytosis.  CT chest with contrast showed perihepatic/subdiaphragmatic abscess.  Antibiotics coverage broadened.  General surgery following again.  IR abscess drainage 5/12, on Zosyn. PT has recommended skilled nursing facility.  Assessment & Plan:  Principal Problem:   Acute respiratory failure with hypoxia (HCC) Active Problems:   COPD (chronic obstructive pulmonary disease) (HCC)   GERD   Insulin resistance   DNR (do not resuscitate)   Memory loss   BPPV (benign paroxysmal positional vertigo)   Hypokalemia   Insomnia   S/P laparoscopic cholecystectomy   Acute respiratory failure (HCC)   Encephalopathy   AKI (acute kidney injury)  (HCC)   Postoperative hematoma involving digestive system following digestive system procedure  Perihepatic/subdiaphragmatic abscess:She had persistent fever, worsening leukocytosis.  CT chest with contrast showed perihepatic/subdiaphragmatic abscess.  Antibiotics coverage broadened.  General surgery following again.  IR consulted for abscess drainage done 5/12 and abx broadened to Zosyn. Added Linezolid 5/15 to cover Enterococcus growing from drain culture. IR recommends repeat CT with contrast after drainage tapers off, to check for further loculations, etc. She had only 60cc drain 5/15, but this AM there is a lot of serosanguinous drainage. Follow cx data, culture obtained 5/12.  Acute hypoxic hypercarbic respiratory failure: On presentation.Most likely secondary to respiratory depression secondary to tramadol overuse.  She was just discharged from here after cholecystectomy and was prescribed tramadol for pain.  Chest x-ray does not show any acute findings.  She was hypoxic on room air and had to be put on BiPAP on admission.  ABG showed hypercarbia with CO2 of 68.  Mentation  improved after she was  placed on BiPAP and with  Narcan. Hold any opiates or sedating medications.   CT head did not show any acute intracranial abnormalities. Mental and respiratory status improved and she has been maintaining her saturation on room air.  Off BiPAP. Her home Lasix has been restarted.Chest x-ray done on 04/10/2020 showed right pleural effusion.  Underwent  ultrasound-guided thoracentesis of the right chest.  AKI: Resolved with IV fluids.  Renal ultrasound did not show any acute findings. Foley was placed on admission which has been discontinued.  Acute cholecystitis status post laparoscopic cholecystectomy on 04/04/2020: Presented with significant leukocytosis, fever of 100 F.  Noted hypotension on arrival, lactic cidosis.  General surgery following.   CT  abdomen/pelvis done here did not show complication of  recent surgery. But CT chest showed perihepatic/subdiapshragmatic abscess being managed as above.  Permanent A. fib: Currently rate is controlled.  On Eliquis for anticoagulation.  Not on any rate control agents.  Eliquis was held for drain placement and has been resumed.  Hypokalemia: Being supplemented.  Peripheral vascular disease: On Plavix, Lipitor.    COPD: Currently stable.  No signs of acute exacerbation.  Continue bronchodilators as needed.  Hypertension: On ARB at home which is which has been resumed.  Continue to monitor.   Debility/deconditioning: PT recommended skilled nursing facility.SW consulted and following.     DVT prophylaxis: Eliquis Code Status: DNR Family Communication: Called and discussed with son on phone on 04/11/20 Status is: inaptient  Dispo: The patient is from: Home              Anticipated d/c is to:SNF              Anticipated d/c date is:2-3 days              Patient currently is not medically stable to d/c.   Needs final cultures and repeat CT scan prior to discharge.  Consultants: Surgery, IR  Procedures:None  Antimicrobials:  Anti-infectives (From admission, onward)   Start     Dose/Rate Route Frequency Ordered Stop   04/14/20 1800  linezolid (ZYVOX) tablet 600 mg     600 mg Oral Every 12 hours 04/14/20 1556     04/13/20 1000  anidulafungin (ERAXIS) 100 mg in sodium chloride 0.9 % 100 mL IVPB  Status:  Discontinued     100 mg 78 mL/hr over 100 Minutes Intravenous Every 24 hours 04/12/20 0734 04/14/20 0947   04/12/20 1000  anidulafungin (ERAXIS) 200 mg in sodium chloride 0.9 % 200 mL IVPB     200 mg 78 mL/hr over 200 Minutes Intravenous  Once 04/12/20 0734 04/12/20 1516   04/12/20 0815  anidulafungin (ERAXIS) 100 mg in sodium chloride 0.9 % 100 mL IVPB  Status:  Discontinued     100 mg 78 mL/hr over 100 Minutes Intravenous Every 24 hours 04/12/20 0721 04/12/20 0734   04/12/20 0800  piperacillin-tazobactam (ZOSYN) IVPB 3.375 g     3.375  g 12.5 mL/hr over 240 Minutes Intravenous Every 8 hours 04/12/20 0718     04/11/20 2000  metroNIDAZOLE (FLAGYL) IVPB 500 mg  Status:  Discontinued     500 mg 100 mL/hr over 60 Minutes Intravenous Every 8 hours 04/11/20 1859 04/12/20 0718   04/11/20 0800  vancomycin (VANCOREADY) IVPB 1250 mg/250 mL  Status:  Discontinued     1,250 mg 166.7 mL/hr over 90 Minutes Intravenous Every 24 hours 04/10/20 1602 04/12/20 1048   04/10/20 2200  ceFEPIme (MAXIPIME) 2 g in sodium chloride 0.9 % 100 mL IVPB  Status:  Discontinued     2 g 200 mL/hr over 30 Minutes Intravenous Every 12 hours 04/10/20 1552 04/12/20 0718   04/10/20 1400  vancomycin (VANCOCIN) IVPB 1000 mg/200 mL premix     1,000 mg 200 mL/hr over 60 Minutes Intravenous  Once 04/10/20 1256 04/10/20 1451   04/10/20 1400  piperacillin-tazobactam (ZOSYN) IVPB 3.375 g     3.375 g 100 mL/hr over 30 Minutes Intravenous  Once 04/10/20 1256 04/10/20 1417      Subjective: Patient seen and examined at the bedside this morning.  Hemodynamically stable, has no complaints. She now has an appetite and is feeling better.  Objective: Vitals:  04/15/20 0548 04/15/20 1545 04/15/20 2055 04/16/20 0605  BP: 132/62 136/68 136/68 134/60  Pulse: 69 78 69 66  Resp: 19 15 18 17   Temp: 98.1 F (36.7 C) 98.3 F (36.8 C) 98 F (36.7 C) (!) 97.5 F (36.4 C)  TempSrc:  Oral Oral Oral  SpO2: 95% 96% 99% 97%  Weight:      Height:        Intake/Output Summary (Last 24 hours) at 04/16/2020 0930 Last data filed at 04/16/2020 0453 Gross per 24 hour  Intake 993.89 ml  Output 60 ml  Net 933.89 ml   Filed Weights   04/07/20 1621  Weight: 64.5 kg    Examination:  General exam: Elderly debilitated female, comfortable  Respiratory system: Diminished air sound on the right side , R chest JP drain with scant bloody material (was just emptied per patient) cardiovascular system: Irregularly irregular rhythm. No JVD, murmurs, rubs, gallops or  clicks. Gastrointestinal system: Abdomen is nondistended, soft and has very mild tenderness on the right subcostal region. No organomegaly or masses felt. Normal bowel sounds heard. Central nervous system: Alert and oriented. No focal neurological deficits. Extremities: No edema, no clubbing ,no cyanosis, distal peripheral pulses palpable. Skin: No rashes, lesions or ulcers,no icterus ,no pallor   Data Reviewed: I have personally reviewed following labs and imaging studies  CBC: Recent Labs  Lab 04/12/20 0452 04/13/20 0510 04/14/20 0509 04/15/20 0507 04/16/20 0530  WBC 23.2* 19.2* 12.4* 12.4* 11.7*  NEUTROABS 20.4* 16.7* 9.6* 8.9* 8.5*  HGB 9.3* 8.2* 8.4* 8.5* 8.6*  HCT 28.3* 26.0* 26.8* 26.5* 27.2*  MCV 83.7 86.7 87.0 84.7 85.8  PLT 217 257 265 365 397   Basic Metabolic Panel: Recent Labs  Lab 04/11/20 0510 04/12/20 0452 04/13/20 0510 04/14/20 0509 04/15/20 0507  NA 141 137 137 137 137  K 3.7 3.2* 3.0* 2.7* 3.4*  CL 109 104 105 105 104  CO2 21* 22 22 22 24   GLUCOSE 125* 106* 94 111* 108*  BUN 24* 22 21 22 20   CREATININE 0.89 0.72 0.82 0.89 0.68  CALCIUM 7.8* 7.3* 7.4* 7.3* 7.7*   GFR: Estimated Creatinine Clearance: 47.3 mL/min (by C-G formula based on SCr of 0.68 mg/dL). Liver Function Tests: No results for input(s): AST, ALT, ALKPHOS, BILITOT, PROT, ALBUMIN in the last 168 hours. No results for input(s): LIPASE, AMYLASE in the last 168 hours. No results for input(s): AMMONIA in the last 168 hours. Coagulation Profile: Recent Labs  Lab 04/12/20 0732  INR 1.8*   Cardiac Enzymes: No results for input(s): CKTOTAL, CKMB, CKMBINDEX, TROPONINI in the last 168 hours. BNP (last 3 results) Recent Labs    02/11/20 0936  PROBNP 228.0*   HbA1C: No results for input(s): HGBA1C in the last 72 hours. CBG: No results for input(s): GLUCAP in the last 168 hours. Lipid Profile: No results for input(s): CHOL, HDL, LDLCALC, TRIG, CHOLHDL, LDLDIRECT in the last 72  hours. Thyroid Function Tests: No results for input(s): TSH, T4TOTAL, FREET4, T3FREE, THYROIDAB in the last 72 hours. Anemia Panel: No results for input(s): VITAMINB12, FOLATE, FERRITIN, TIBC, IRON, RETICCTPCT in the last 72 hours. Sepsis Labs: No results for input(s): PROCALCITON, LATICACIDVEN in the last 168 hours.  Recent Results (from the past 240 hour(s))  Respiratory Panel by RT PCR (Flu A&B, Covid) - Nasopharyngeal Swab     Status: None   Collection Time: 04/06/20  6:37 PM   Specimen: Nasopharyngeal Swab  Result Value Ref Range Status   SARS Coronavirus 2  by RT PCR NEGATIVE NEGATIVE Final    Comment: (NOTE) SARS-CoV-2 target nucleic acids are NOT DETECTED. The SARS-CoV-2 RNA is generally detectable in upper respiratoy specimens during the acute phase of infection. The lowest concentration of SARS-CoV-2 viral copies this assay can detect is 131 copies/mL. A negative result does not preclude SARS-Cov-2 infection and should not be used as the sole basis for treatment or other patient management decisions. A negative result may occur with  improper specimen collection/handling, submission of specimen other than nasopharyngeal swab, presence of viral mutation(s) within the areas targeted by this assay, and inadequate number of viral copies (<131 copies/mL). A negative result must be combined with clinical observations, patient history, and epidemiological information. The expected result is Negative. Fact Sheet for Patients:  https://www.moore.com/ Fact Sheet for Healthcare Providers:  https://www.young.biz/ This test is not yet ap proved or cleared by the [place] FDA and  has been authorized for detection and/or diagnosis of SARS-CoV-2 by FDA under an Emergency Use Authorization (EUA). This EUA will remain  in effect (meaning this test can be used) for the duration of the COVID-19 declaration under Section 564(b)(1) of the Act, 21  U.S.C. section 360bbb-3(b)(1), unless the authorization is terminated or revoked sooner.    Influenza A by PCR NEGATIVE NEGATIVE Final   Influenza B by PCR NEGATIVE NEGATIVE Final    Comment: (NOTE) The Xpert Xpress SARS-CoV-2/FLU/RSV assay is intended as an aid in  the diagnosis of influenza from Nasopharyngeal swab specimens and  should not be used as a sole basis for treatment. Nasal washings and  aspirates are unacceptable for Xpert Xpress SARS-CoV-2/FLU/RSV  testing. Fact Sheet for Patients: https://www.moore.com/ Fact Sheet for Healthcare Providers: https://www.young.biz/ This test is not yet approved or cleared by the [place] FDA and  has been authorized for detection and/or diagnosis of SARS-CoV-2 by  FDA under an Emergency Use Authorization (EUA). This EUA will remain  in effect (meaning this test can be used) for the duration of the  Covid-19 declaration under Section 564(b)(1) of the Act, 21  U.S.C. section 360bbb-3(b)(1), unless the authorization is  terminated or revoked. Performed at [facility], [address] [place], [place] [address]   MRSA PCR Screening     Status: None   Collection Time: 04/07/20  4:02 PM   Specimen: Nasal Mucosa; Nasopharyngeal  Result Value Ref Range Status   MRSA by PCR NEGATIVE NEGATIVE Final    Comment:        The GeneXpert MRSA Assay (FDA approved for NASAL specimens only), is one component of a comprehensive MRSA colonization surveillance program. It is not intended to diagnose MRSA infection nor to guide or monitor treatment for MRSA infections. Performed at [facility], [address], [place], [place] [address]   Culture, blood (routine x 2)     Status: None   Collection Time: 04/10/20  1:08 PM   Specimen: BLOOD LEFT HAND  Result Value Ref Range Status   Specimen Description   Final    BLOOD LEFT HAND Performed at [facility], [address] [place], [place] [address]    Special Requests   Final    BOTTLES DRAWN AEROBIC ONLY Blood Culture results may not be optimal due to an inadequate volume of blood received in culture bottles Performed at [facility], [address], [place], [place] [address]    Culture   Final    NO GROWTH 5 DAYS Performed at  [facility], [address], [place], [place] [address]    Report Status 04/15/2020 FINAL  Final  Culture, blood (routine x 2)     Status: None   Collection Time: 04/10/20  1:08 PM   Specimen: BLOOD  Result Value Ref Range Status   Specimen Description   Final    BLOOD RIGHT ANTECUBITAL Performed at [facility], [address] [place], [place] [address]    Special Requests   Final    BOTTLES DRAWN AEROBIC AND ANAEROBIC Blood Culture adequate volume Performed at [facility], [address], [place], [place] [address]    Culture   Final    NO GROWTH 5 DAYS Performed at [facility], [address], [place], [place] [address]    Report Status 04/15/2020 FINAL  Final  Culture, body fluid-bottle     Status: None (Preliminary result)   Collection Time: 04/11/20  3:08 PM   Specimen: Fluid  Result Value Ref Range Status   Specimen Description PENDING  Incomplete   Special Requests PENDING  Incomplete   Culture   Final    NO GROWTH 5 DAYS Performed at [facility], [address], [place], [place] [address]    Report Status PENDING  Incomplete  Gram stain     Status: None   Collection Time: 04/11/20  3:08 PM   Specimen: Fluid  Result Value Ref Range Status   Specimen Description FLUID PLEURAL  Final   Special Requests NONE  Final   Gram Stain   Final    WBC PRESENT,BOTH PMN AND MONONUCLEAR NO ORGANISMS SEEN CYTOSPIN SMEAR Performed at [facility], [address] [place], [place] [address]    Report Status 04/11/2020 FINAL  Final  Culture, body  fluid-bottle     Status: None   Collection Time: 04/11/20  3:08 PM   Specimen: Fluid  Result Value Ref Range Status   Specimen Description FLUID PLEURAL  Final   Special Requests BOTTLES DRAWN AEROBIC AND ANAEROBIC  Final   Culture   Final    NO GROWTH 5 DAYS Performed at [facility], [address], [place], [place] [address]    Report Status 04/16/2020 FINAL  Final  Gram stain     Status: None   Collection Time: 04/11/20  3:08 PM   Specimen: Fluid  Result Value Ref Range Status   Specimen Description FLUID PLEURAL  Final   Special Requests NONE  Final   Gram Stain   Final    WBC PRESENT,BOTH PMN AND MONONUCLEAR NO ORGANISMS SEEN CYTOSPIN SMEAR Performed at [facility], [address], [place], [place] [address]    Report Status 04/12/2020 FINAL  Final  Aerobic/Anaerobic Culture (surgical/deep wound)     Status: None (Preliminary result)   Collection Time: 04/12/20  1:40 PM   Specimen: Abdominal Fluid  Result Value Ref Range Status   Specimen Description   Final    FLUID ABDOMEN Performed at [facility], [address] [place], [place] [address]    Special Requests   Final    PERIHEPATIC FLUID Performed at [facility], [address] [place], [place] [address]    Gram Stain   Final    ABUNDANT WBC PRESENT, PREDOMINANTLY PMN RARE GRAM POSITIVE COCCI Performed at [facility], [address], [place], [place] [address]    Culture   Final  FEW VANCOMYCIN RESISTANT ENTEROCOCCUS ISOLATED NO ANAEROBES ISOLATED; CULTURE IN PROGRESS FOR 5 DAYS    Report Status PENDING  Incomplete   Organism ID, Bacteria VANCOMYCIN RESISTANT ENTEROCOCCUS ISOLATED  Final      Susceptibility   Vancomycin resistant enterococcus isolated - MIC*    AMPICILLIN >=32 RESISTANT Resistant     VANCOMYCIN >=32 RESISTANT Resistant     GENTAMICIN SYNERGY SENSITIVE Sensitive     LINEZOLID 2 SENSITIVE Sensitive     * FEW VANCOMYCIN RESISTANT  ENTEROCOCCUS ISOLATED    Radiology Studies: No results found. Scheduled Meds: . apixaban  5 mg Oral BID  . atorvastatin  40 mg Oral Daily  . clopidogrel  75 mg Oral Daily  . feeding supplement (ENSURE ENLIVE)  237 mL Oral BID BM  . furosemide  20 mg Oral Daily  . irbesartan  300 mg Oral Daily  . linezolid  600 mg Oral Q12H  . polyethylene glycol  17 g Oral BID  . sodium chloride flush  5 mL Intracatheter Q8H   Continuous Infusions: . sodium chloride 250 mL (04/10/20 1350)  . piperacillin-tazobactam (ZOSYN)  IV 3.375 g (04/16/20 0749)   Time spent: 23 mins.More than 50% of that time was spent in counseling and/or coordination of care.  Child Campoy Vergie Living, MD [facility] P5/16/2021, 9:30 AM

## 2020-04-17 ENCOUNTER — Encounter (HOSPITAL_COMMUNITY): Payer: Self-pay | Admitting: Internal Medicine

## 2020-04-17 DIAGNOSIS — I5032 Chronic diastolic (congestive) heart failure: Secondary | ICD-10-CM | POA: Diagnosis not present

## 2020-04-17 DIAGNOSIS — M2550 Pain in unspecified joint: Secondary | ICD-10-CM | POA: Diagnosis not present

## 2020-04-17 DIAGNOSIS — J9601 Acute respiratory failure with hypoxia: Secondary | ICD-10-CM | POA: Diagnosis not present

## 2020-04-17 DIAGNOSIS — R0902 Hypoxemia: Secondary | ICD-10-CM | POA: Diagnosis not present

## 2020-04-17 DIAGNOSIS — Z9049 Acquired absence of other specified parts of digestive tract: Secondary | ICD-10-CM | POA: Diagnosis not present

## 2020-04-17 DIAGNOSIS — Z20828 Contact with and (suspected) exposure to other viral communicable diseases: Secondary | ICD-10-CM | POA: Diagnosis not present

## 2020-04-17 DIAGNOSIS — J9600 Acute respiratory failure, unspecified whether with hypoxia or hypercapnia: Secondary | ICD-10-CM | POA: Diagnosis not present

## 2020-04-17 DIAGNOSIS — K219 Gastro-esophageal reflux disease without esophagitis: Secondary | ICD-10-CM | POA: Diagnosis not present

## 2020-04-17 DIAGNOSIS — Z7401 Bed confinement status: Secondary | ICD-10-CM | POA: Diagnosis not present

## 2020-04-17 DIAGNOSIS — I10 Essential (primary) hypertension: Secondary | ICD-10-CM | POA: Diagnosis not present

## 2020-04-17 DIAGNOSIS — G2581 Restless legs syndrome: Secondary | ICD-10-CM | POA: Diagnosis not present

## 2020-04-17 DIAGNOSIS — L89621 Pressure ulcer of left heel, stage 1: Secondary | ICD-10-CM | POA: Diagnosis not present

## 2020-04-17 DIAGNOSIS — J90 Pleural effusion, not elsewhere classified: Secondary | ICD-10-CM | POA: Diagnosis not present

## 2020-04-17 DIAGNOSIS — R531 Weakness: Secondary | ICD-10-CM | POA: Diagnosis not present

## 2020-04-17 DIAGNOSIS — M48061 Spinal stenosis, lumbar region without neurogenic claudication: Secondary | ICD-10-CM | POA: Diagnosis not present

## 2020-04-17 DIAGNOSIS — K91870 Postprocedural hematoma of a digestive system organ or structure following a digestive system procedure: Secondary | ICD-10-CM | POA: Diagnosis not present

## 2020-04-17 DIAGNOSIS — I480 Paroxysmal atrial fibrillation: Secondary | ICD-10-CM | POA: Diagnosis not present

## 2020-04-17 DIAGNOSIS — R2689 Other abnormalities of gait and mobility: Secondary | ICD-10-CM | POA: Diagnosis not present

## 2020-04-17 DIAGNOSIS — N179 Acute kidney failure, unspecified: Secondary | ICD-10-CM | POA: Diagnosis not present

## 2020-04-17 DIAGNOSIS — K750 Abscess of liver: Secondary | ICD-10-CM | POA: Diagnosis not present

## 2020-04-17 DIAGNOSIS — R278 Other lack of coordination: Secondary | ICD-10-CM | POA: Diagnosis not present

## 2020-04-17 DIAGNOSIS — I509 Heart failure, unspecified: Secondary | ICD-10-CM | POA: Diagnosis not present

## 2020-04-17 DIAGNOSIS — J449 Chronic obstructive pulmonary disease, unspecified: Secondary | ICD-10-CM | POA: Diagnosis not present

## 2020-04-17 DIAGNOSIS — E782 Mixed hyperlipidemia: Secondary | ICD-10-CM | POA: Diagnosis not present

## 2020-04-17 DIAGNOSIS — R2681 Unsteadiness on feet: Secondary | ICD-10-CM | POA: Diagnosis not present

## 2020-04-17 DIAGNOSIS — Z1629 Resistance to other single specified antibiotic: Secondary | ICD-10-CM | POA: Diagnosis not present

## 2020-04-17 DIAGNOSIS — J80 Acute respiratory distress syndrome: Secondary | ICD-10-CM | POA: Diagnosis not present

## 2020-04-17 DIAGNOSIS — R339 Retention of urine, unspecified: Secondary | ICD-10-CM | POA: Diagnosis not present

## 2020-04-17 DIAGNOSIS — R41841 Cognitive communication deficit: Secondary | ICD-10-CM | POA: Diagnosis not present

## 2020-04-17 DIAGNOSIS — I4891 Unspecified atrial fibrillation: Secondary | ICD-10-CM | POA: Diagnosis not present

## 2020-04-17 DIAGNOSIS — M6281 Muscle weakness (generalized): Secondary | ICD-10-CM | POA: Diagnosis not present

## 2020-04-17 DIAGNOSIS — F329 Major depressive disorder, single episode, unspecified: Secondary | ICD-10-CM | POA: Diagnosis not present

## 2020-04-17 DIAGNOSIS — T8143XA Infection following a procedure, organ and space surgical site, initial encounter: Secondary | ICD-10-CM | POA: Diagnosis not present

## 2020-04-17 LAB — BASIC METABOLIC PANEL
Anion gap: 9 (ref 5–15)
BUN: 20 mg/dL (ref 8–23)
CO2: 26 mmol/L (ref 22–32)
Calcium: 7.8 mg/dL — ABNORMAL LOW (ref 8.9–10.3)
Chloride: 102 mmol/L (ref 98–111)
Creatinine, Ser: 0.8 mg/dL (ref 0.44–1.00)
GFR calc Af Amer: >60 mL/min (ref >60)
GFR calc non Af Amer: >60 mL/min (ref >60)
Glucose, Bld: 105 mg/dL — ABNORMAL HIGH (ref 70–99)
Potassium: 3.7 mmol/L (ref 3.5–5.1)
Sodium: 137 mmol/L (ref 135–145)

## 2020-04-17 LAB — AEROBIC/ANAEROBIC CULTURE W GRAM STAIN (SURGICAL/DEEP WOUND)

## 2020-04-17 LAB — CBC WITH DIFFERENTIAL/PLATELET
Abs Immature Granulocytes: 0.19 10*3/uL — ABNORMAL HIGH (ref 0.00–0.07)
Basophils Absolute: 0.1 10*3/uL (ref 0.0–0.1)
Basophils Relative: 0 %
Eosinophils Absolute: 0.2 10*3/uL (ref 0.0–0.5)
Eosinophils Relative: 2 %
HCT: 26.6 % — ABNORMAL LOW (ref 36.0–46.0)
Hemoglobin: 8.4 g/dL — ABNORMAL LOW (ref 12.0–15.0)
Immature Granulocytes: 2 %
Lymphocytes Relative: 18 %
Lymphs Abs: 2.2 10*3/uL (ref 0.7–4.0)
MCH: 27.5 pg (ref 26.0–34.0)
MCHC: 31.6 g/dL (ref 30.0–36.0)
MCV: 86.9 fL (ref 80.0–100.0)
Monocytes Absolute: 1.1 10*3/uL — ABNORMAL HIGH (ref 0.1–1.0)
Monocytes Relative: 9 %
Neutro Abs: 8.3 10*3/uL — ABNORMAL HIGH (ref 1.7–7.7)
Neutrophils Relative %: 69 %
Platelets: 461 10*3/uL — ABNORMAL HIGH (ref 150–400)
RBC: 3.06 MIL/uL — ABNORMAL LOW (ref 3.87–5.11)
RDW: 14.9 % (ref 11.5–15.5)
WBC: 12.1 10*3/uL — ABNORMAL HIGH (ref 4.0–10.5)
nRBC: 0 % (ref 0.0–0.2)

## 2020-04-17 LAB — CULTURE, BODY FLUID W GRAM STAIN -BOTTLE: Culture: NO GROWTH

## 2020-04-17 MED ORDER — TRAZODONE HCL 50 MG PO TABS: 75.0000 mg | ORAL_TABLET | Freq: Every evening | ORAL | 0 refills | Status: DC | PRN

## 2020-04-17 MED ORDER — BISACODYL 10 MG RE SUPP: 10.0000 mg | Freq: Every day | RECTAL | 0 refills | Status: DC | PRN

## 2020-04-17 MED ORDER — LINEZOLID 600 MG PO TABS: 600.0000 mg | ORAL_TABLET | Freq: Two times a day (BID) | ORAL | 0 refills | Status: AC

## 2020-04-17 NOTE — Care Management Important Message (Signed)
Important Message  Patient Details IM Letter given to [name] SW Case Manager to present to the Patient Name: ARASELY AKKERMAN MRN: [id number] Date of Birth: 30-Sep-1931   Medicare Important Message Given:  Yes     Caren Macadam 04/17/2020, 11:47 AM

## 2020-04-17 NOTE — Discharge Summary (Signed)
Discharge Summary  Cheryl Atkinson ZOX:[id number] DOB: 11/12/1931  PCP: Shelva Majestic, MD  Admit date: 04/06/2020 Discharge date: 04/17/2020   Recommendations for Outpatient Follow-up:  1. Patient will follow up with interventional radiology, they will contact her for appointment. 2. Outpatient follow-up with general surgery also to be arranged.  Discharge Diagnoses:  Active Hospital Problems   Diagnosis Date Noted  . Acute respiratory failure with hypoxia (HCC) 04/07/2020  . Postoperative hematoma involving digestive system following digestive system procedure 04/13/2020  . Encephalopathy 04/07/2020  . AKI (acute kidney injury) (HCC) 04/07/2020  . Acute respiratory failure (HCC) 04/06/2020  . S/P laparoscopic cholecystectomy 04/04/2020  . Insomnia 03/01/2020  . Hypokalemia 12/21/2016  . BPPV (benign paroxysmal positional vertigo) 07/12/2016  . Memory loss 04/11/2016  . DNR (do not resuscitate) 09/29/2014  . Insulin resistance 07/19/2014  . COPD (chronic obstructive pulmonary disease) (HCC) 09/20/2009  . GERD 09/25/2007    Resolved Hospital Problems   Diagnosis Date Noted Date Resolved  . Acute cholecystitis 12/29/2019 04/13/2020    Discharge Condition: Stable   Diet recommendation: As tolerated  Vitals:   04/16/20 1329 04/16/20 2116  BP: 126/60 125/60  Pulse: 70 62  Resp: 16 18  Temp: 98.4 F (36.9 C) 97.8 F (36.6 C)  SpO2: 98% 98%    HPI and Brief Hospital Course:  This is a pleasant 86 year old Caucasian female with a history of persistent atrial fibrillation, chronic diastolic congestive heart failure, COPD, GERD, hypertension recently had inpatient laparoscopic cholecystectomy for cholecystitis.  She was discharged from this facility 04/05/2020, she came back to the emergency department with family on 5/6 due to concern for lethargy, confusion.  ABG showed hypercarbia, work-up also revealed fever leukocytosis, found to have AKI.  She was started on Narcan as  needed for opioid toxicity, and condition improved.  Chest x-ray was done 5/10 which showed right pleural effusion, she underwent thoracentesis with removal of 650 mL of fluid.  She had persistent fever with worsening leukocytosis, CT chest was done with contrast which showed perihepatic/subdiaphragmatic abscess.  Antibiotic coverage was broadened with Zosyn, she was also started on antifungal therapy.  General surgery was following as an inpatient, they recommended IR abscess drainage which happened 5/12.  Patient's antibiotics were adjusted to addition of linezolid after she grew VRE from her drain.  Physical therapy has recommended subacute nursing facility, and a bed is available.  Over the last 48 hours, the patient has done well, tolerating a diet, afebrile without any evidence of sepsis or untreated infection.  She will be discharged from the hospital today, to complete a course of oral linezolid for her VRE.  She will follow up as an outpatient with interventional radiology as well as general surgery.  Discharge details, plan of care and follow up instructions were discussed with patient and any available family or care providers. Patient and family are in agreement with discharge from the hospital today and all questions were answered to their satisfaction.  Consultations:  IR  Gen surg   Discharge Exam: BP 125/60 (BP Location: Left Arm)   Pulse 62   Temp 97.8 F (36.6 C) (Oral)   Resp 18   Ht  (1.676 m)   Wt 64.5 kg   LMP  (LMP Unknown)   SpO2 98%   BMI 22.95 kg/m  General:  Alert, oriented, calm, in no acute distress  Eyes: EOMI, clear sclerea Neck: supple, no masses, trachea mildline  Cardiovascular: RRR, no murmurs or rubs, no peripheral  edema  Respiratory: clear to auscultation bilaterally, no wheezes, no crackles  Abdomen: soft, nontender, nondistended, normal bowel tones heard  Skin: dry, no rashes  Musculoskeletal: no joint effusions, normal range of motion    Psychiatric: appropriate affect, normal speech  Neurologic: extraocular muscles intact, clear speech, moving all extremities with intact sensorium    Discharge Instructions You were cared for by a hospitalist during your hospital stay. If you have any questions about your discharge medications or the care you received while you were in the hospital after you are discharged, you can call the unit and asked to speak with the hospitalist on call if the hospitalist that took care of you is not available. Once you are discharged, your primary care physician will handle any further medical issues. Please note that NO REFILLS for any discharge medications will be authorized once you are discharged, as it is imperative that you return to your primary care physician (or establish a relationship with a primary care physician if you do not have one) for your aftercare needs so that they can reassess your need for medications and monitor your lab values.  Discharge Instructions    Diet - low sodium heart healthy   Complete by: As directed    Increase activity slowly   Complete by: As directed      Allergies as of 04/17/2020      Reactions   Codeine Other (See Comments)   REACTION: Syncope       Medication List    TAKE these medications   Albuterol Sulfate 108 (90 Base) MCG/ACT Aepb Commonly known as: ProAir RespiClick Inhale 2 puffs into the lungs every 6 (six) hours as needed (shortness of breath from COPD).   apixaban 5 MG Tabs tablet Commonly known as: Eliquis Take 1 tablet (5 mg total) by mouth 2 (two) times daily. Start 04/06/20   atorvastatin 40 MG tablet Commonly known as: LIPITOR Take 1 tablet (40 mg total) by mouth daily.   bisacodyl 10 MG suppository Commonly known as: DULCOLAX Place 1 suppository (10 mg total) rectally daily as needed for moderate constipation.   clopidogrel 75 MG tablet Commonly known as: PLAVIX Take 1 tablet (75 mg total) by mouth daily. Start 04/06/20    furosemide 40 MG tablet Commonly known as: LASIX Take 1 tablet (40 mg total) by mouth every other day.   irbesartan 300 MG tablet Commonly known as: AVAPRO Take 1 tablet (300 mg total) by mouth daily.   linezolid 600 MG tablet Commonly known as: ZYVOX Take 1 tablet (600 mg total) by mouth every 12 (twelve) hours for 11 days.   Neupro 3 MG/24HR Pt24 Generic drug: Rotigotine PLACE 1 PATCH (3 MG) ONTO THE SKIN AT BEDTIME What changed: See the new instructions.   polyethylene glycol 17 g packet Commonly known as: MIRALAX / GLYCOLAX Take 17 g by mouth 2 (two) times daily. Reported on 03/14/2016 What changed:   when to take this  reasons to take this  additional instructions   potassium chloride SA 20 MEQ tablet Commonly known as: KLOR-CON Take 1 tablet (20 mEq total) by mouth daily as needed (take on days that you take lasix). What changed: when to take this   PRESERVISION AREDS 2 PO Take 1 tablet by mouth 2 (two) times daily.   sodium chloride flush 0.9 % Soln Commonly known as: NS 5 mLs by Intracatheter route every 8 (eight) hours.   traZODone 50 MG tablet Commonly known as: DESYREL Take 1.5 tablets (  75 mg total) by mouth at bedtime as needed for sleep.      Allergies  Allergen Reactions  . Codeine Other (See Comments)    REACTION: Syncope    Follow-up Information    Violeta Gelinas, MD. Go on 05/17/2020.   Specialty: General Surgery Why: Your appointment is 6/16 at 10am Please arrive 15 minutes prior to your appointment to check in and fill out paperwork. Contact information: [address] [place] [address] [phone]            The results of significant diagnostics from this hospitalization (including imaging, microbiology, ancillary and laboratory) are listed below for reference.    Significant Diagnostic Studies: CT ABDOMEN PELVIS WO CONTRAST  Result Date: 04/08/2020 CLINICAL DATA:  Postop abdominal pain and fever. Recent  cholecystectomy. EXAM: CT ABDOMEN AND PELVIS WITHOUT CONTRAST TECHNIQUE: Multidetector CT imaging of the abdomen and pelvis was performed following the standard protocol without IV contrast. COMPARISON:  01/25/2020 FINDINGS: Lower chest: New small right pleural effusion. Dependent atelectasis/consolidation posteriorly in the right lower lobe. Coronary calcifications. Hepatobiliary: Surgical clips from cholecystectomy. Low attenuation material in the gallbladder fossa with scattered gas attenuation. No focal liver lesion. The CBD is prominent measuring up to 1.7 cm diameter, down to the ampulla level. Mild central intrahepatic biliary ductal dilatation. Pancreas: Mild diffuse atrophy without mass or ductal dilatation. Spleen: Normal in size without focal abnormality. Adrenals/Urinary Tract: Stable 1.9 cm left adrenal nodule. Kidneys unremarkable. Urinary bladder decompressed by Foley catheter. Stomach/Bowel: Stomach is decompressed. Small bowel is nondilated. Appendix surgically absent. Gas and moderate fecal material in the proximal, decompressed distally. Vascular/Lymphatic: Heavy aortoiliac arterial calcifications. Infrarenal aorta 2.7 cm diameter. Metallic stent in the right common iliac artery. No definite abdominal or pelvic adenopathy. Reproductive: Status post hysterectomy. No adnexal masses. Other: Small volume perihepatic ascites. No free air. Scattered gas bubbles in the abdominal body wall presumably related to recent surgery. Musculoskeletal: Degenerative disc disease L5-S1. Old healed sternal fracture. No acute fracture or worrisome bone lesion. IMPRESSION: 1. Small volume perihepatic ascites post cholecystectomy. 2. Mild central intrahepatic biliary duct dilatation and distended CBD. Correlate with any clinical or laboratory evidence of biliary obstruction. 3. New small right pleural effusion with dependent atelectasis/consolidation in the right lower lobe. 4. Low-attenuation material in the  gallbladder fossa with scattered gas attenuation, possibly related to Surgicel from cholecystectomy. 5. Stable left adrenal nodule. Aortic Atherosclerosis (ICD10-I70.0). Electronically Signed   By: Corlis Leak M.D.   On: 04/08/2020 12:24   DG Chest 1 View  Result Date: 04/10/2020 CLINICAL DATA:  Atrial fibrillation with shortness of breath. EXAM: CHEST  1 VIEW COMPARISON:  Apr 09, 2020 FINDINGS: Right pleural effusion again noted. There is apparent atelectatic change in the medial right base, stable. Lungs elsewhere are clear. Heart is mildly enlarged with pulmonary vascularity normal. There is stable aortic prominence with aortic atherosclerosis. No adenopathy. Chondroid matrix lesion in the proximal right humerus is stable. IMPRESSION: Persistent right pleural effusion an apparent medial right base atelectasis. Lungs elsewhere clear. Stable cardiac silhouette.  Aortic Atherosclerosis (ICD10-I70.0). Stable chondroid matrix lesion in the proximal right humerus, likely either enchondroma or bone infarct in etiology. Electronically Signed   By: Bretta Bang III M.D.   On: 04/10/2020 13:46   DG Chest 1 View  Result Date: 04/09/2020 CLINICAL DATA:  Shortness of breath EXAM: CHEST  1 VIEW COMPARISON:  Apr 06, 2020 FINDINGS: The patient's mandible obscures much of the right apex. There is a  small right pleural effusion. There is atelectatic change in the right mid lung and right base regions. The visualized lungs elsewhere clear. Heart is mildly enlarged with pulmonary vascularity normal. Aorta is prominent with aortic atherosclerosis, stable. There is a chondroid matrix lesion in the proximal right humerus, most likely either an enchondroma or bone infarct. IMPRESSION: Right apex obscured by patient's mandible. Areas of mild atelectasis on the right with small right pleural effusion. Lungs elsewhere clear. Stable cardiac silhouette. Aortic Atherosclerosis (ICD10-I70.0). Stable chondroid matrix lesion in proximal  right humerus. Electronically Signed   By: Lowella Grip III M.D.   On: 04/09/2020 11:22   CT HEAD WO CONTRAST  Result Date: 04/07/2020 CLINICAL DATA:  Encephalopathy.  Decreased responsiveness. EXAM: CT HEAD WITHOUT CONTRAST TECHNIQUE: Contiguous axial images were obtained from the base of the skull through the vertex without intravenous contrast. COMPARISON:  CT head without contrast 08/14/2018 FINDINGS: Brain: Moderate atrophy and diffuse white matter disease is present. No acute infarct, hemorrhage, or mass lesion is present. The ventricles are proportionate to the degree of atrophy. Basal ganglia are intact bilaterally. Cortical ribbon is normal. No significant extra-axial fluid collection is present. Vascular: Dense atherosclerotic calcifications are present bilaterally. No hyperdense vessel is present. Skull: Calvarium is intact. No focal lytic or blastic lesions are present. No significant extracranial soft tissue lesion is present. Sinuses/Orbits: The paranasal sinuses and mastoid air cells are clear. The globes and orbits are within normal limits. Other: Gas locules within the right temporomandibular fossa may represent a small communication from the oral cavity. The oropharynx is distended with what appears to be positive airway pressure. IMPRESSION: 1. No acute intracranial abnormality or significant interval change. 2. Stable atrophy and white matter disease. 3. Atherosclerosis. 4. Gas locules in the right temporomandibular fossa are unlikely to be of consequence to the patient. This may represent a small communication from the oropharynx or be related to IV placement. Electronically Signed   By: San Morelle M.D.   On: 04/07/2020 05:52   CT CHEST W CONTRAST  Result Date: 04/11/2020 CLINICAL DATA:  Pleural effusion, respiratory failure and COPD. EXAM: CT CHEST WITH CONTRAST TECHNIQUE: Multidetector CT imaging of the chest was performed during intravenous contrast administration.  CONTRAST:  36mL OMNIPAQUE IOHEXOL 300 MG/ML  SOLN COMPARISON:  Chest radiograph 04/10/2020 FINDINGS: Despite efforts by the technologist and patient, motion artifact is present on today's exam and could not be eliminated. This reduces exam sensitivity and specificity. Cardiovascular: Coronary, aortic arch, and branch vessel atherosclerotic vascular disease. Mild cardiomegaly. Mediastinum/Nodes: Right thyroid lobe enlargement and nodularity. This was biopsied on 09/15/2013. This has been documented on prior studies (ref: J Am Coll Radiol. 2015 Feb;12(2): 143-50).Lower paratracheal lymph node 0.9 cm in short axis on image 57/3. Right internal mammary lymph node versus venous varix measuring 0.7 cm in short axis diameter on image 89/3. Lungs/Pleura: The right pleural effusion has resolved status post thoracentesis. Volume loss and atelectasis in the right lower lobe and to a lesser extent in the right middle lobe. Mild biapical pleuroparenchymal scarring. Hazy peripheral sub solid density in the left upper lobe peripherally for example on image 26/8, nonspecific but possibly reflecting local alveolitis. Upper Abdomen: Perihepatic ascites with abnormal free intraperitoneal fluid within the right upper quadrant ascites. By report the patient had a laparoscopic cholecystectomy with laparoscopic lysis of adhesions on 04/04/2020. However, the amount of right upper quadrant gas in the perihepatic fluid appears increased compared to the interval CT abdomen from 04/08/2020. Also, there is  concavity of the right anterior contour of the liver on image 119/3 which was not present previously. Fullness of the left adrenal gland, not completely included on today's exam. Musculoskeletal: Thoracic kyphosis. Probable enchondroma in the right proximal humeral metaphysis. There is a small amount of gas tracking along the right chest wall, possibly procedure related. Old bilateral rib fractures and healing bilateral rib fractures are  present. Old sternal body deformity from a prior fracture. IMPRESSION: 1. Right perihepatic fluid collection with enhancing margins is now indenting the liver margin, concerning for infected fluid collection/abscess. Also there is increasing multilocular gas within this fluid collection, concerning for subdiaphragmatic/perihepatic abscess. 2. The right pleural effusion has resolved status post thoracentesis. 3. Volume loss and atelectasis in the right lower lobe and to a lesser extent in the right middle lobe. 4. Hazy peripheral sub solid density in the left upper lobe peripherally, nonspecific but possibly reflecting local alveolitis. 5. Coronary, aortic arch, and branch vessel atherosclerotic vascular disease. Mild cardiomegaly. 6. Perihepatic ascites with abnormal free intraperitoneal fluid within the right upper quadrant ascites. 7. Old bilateral rib fractures and healing bilateral rib fractures. 8. Stable fullness of the left adrenal gland, not completely included on today's exam. 9. Probable enchondroma in the right proximal humeral metaphysis. 10. Thoracic kyphosis. 11. Aortic atherosclerosis. Aortic Atherosclerosis (ICD10-I70.0). Electronically Signed   By: Gaylyn RongWalter  Liebkemann M.D.   On: 04/11/2020 18:40   US RENAL  Result Date: 04/07/2020 CLINICAL DATA:  Acute kidney injury. EXAM: RENAL / URINARY TRACT ULTRASOUND COMPLETE COMPARISON:  CT scan of the abdomen dated 01/25/2020 FINDINGS: Right Kidney: Renal measurements: 8.9 x 4.7 x 4.9 cm = volume: 107 mL . Echogenicity within normal limits. No solid mass or hydronephrosis visualized. 1.4 cm cyst in upper pole. Left Kidney: Renal measurements: 10.3 x 5.1 x 4.5 cm. = volume: 125 mL. Echogenicity within normal limits. No mass or hydronephrosis visualized. Bladder: Multiple bladder diverticula. Otherwise normal. Ureteral jets are identified. Prevoid volume is 265 cc. Other: None. IMPRESSION: No significant abnormality of the kidneys.  Bladder diverticula.  Electronically Signed   By: Francene BoyersJames  Maxwell M.D.   On: 04/07/2020 07:59   DG Chest Port 1 View  Result Date: 04/06/2020 CLINICAL DATA:  Weakness. EXAM: PORTABLE CHEST 1 VIEW COMPARISON:  02/15/2020 FINDINGS: Enlarged cardiac silhouette with an interval increase in size. Tortuous aorta. Minimal bibasilar linear atelectasis or scarring. Stable proximal right humeral calcified enchondroma or old infarct. IMPRESSION: No acute abnormality. Electronically Signed   By: Beckie SaltsSteven  Reid M.D.   On: 04/06/2020 18:42   CT IMAGE GUIDED DRAINAGE BY PERCUTANEOUS CATHETER  Result Date: 04/12/2020 INDICATION: 86 year old with recent cholecystectomy. Patient has a perihepatic fluid collection containing gas. Request for image guided aspiration and drainage. EXAM: CT GUIDED DRAINAGE OF PERIHEPATIC FLUID COLLECTION MEDICATIONS: Moderate sedation ANESTHESIA/SEDATION: 1.0 mg IV Versed 50 mcg IV Fentanyl Moderate Sedation Time:  20 minutes The patient was continuously monitored during the procedure by the interventional radiology nurse under my direct supervision. COMPLICATIONS: None immediate. TECHNIQUE: Informed consent was obtained for CT-guided aspiration and drainage. All questions were addressed. Maximal Sterile Barrier Technique was utilized including caps, mask, sterile gowns, sterile gloves, sterile drape, hand hygiene and skin antiseptic. A timeout was performed prior to the initiation of the procedure. PROCEDURE: Patient was placed supine and CT images through the abdomen were obtained. The perihepatic collection was targeted for sampling. The right side of the abdomen was prepped with chlorhexidine and sterile field was created. Skin and soft tissues were anesthetized  with 1% lidocaine. Using CT guidance, 18 gauge trocar needle was directed into the perihepatic collection. Dark red fluid was aspirated. Superstiff Amplatz wire was advanced into the collection. The tract was dilated to accommodate a 10 [place] drain.  Approximately 100 mL of dark red fluid was removed from the collection. Follow up CT images were obtained. Drain was secured to skin with suture and attached to a suction bulb. Bandage was placed. FINDINGS: Initial CT images demonstrated a small right pleural effusion. Again noted is large perihepatic fluid collection along the right side of the liver. There is gas within this perihepatic collection. Again noted is gas in the cholecystectomy bed which may be related to surgical material. In addition, there may be another postoperative fluid collection measuring roughly 5.5 x 4.6 cm on sequence 2, image 46 that is caudal to the cholecystectomy bed. This collection appears new from the CT on 04/08/2020. However, it is difficult to differentiate bowel from this gas-filled fluid collection. Drain was successfully advanced into the perihepatic collection. Drain tip extends into the cephalad aspect the collection. Approximately 100 mL of fluid was removed. IMPRESSION: CT-guided aspiration and drainage of the perihepatic fluid collection. The perihepatic fluid is dark red and may represent postoperative hematoma. Concern for new fluid collection along the caudal aspect of the cholecystectomy bed measuring up to 5.5 cm. Recommend repeat CT of the abdomen and pelvis with IV and oral contrast when the perihepatic drainage decreases to evaluate for residual loculated fluid or separate collection along the caudal aspect of the cholecystectomy bed. Electronically Signed   By: Richarda Overlie M.D.   On: 04/12/2020 14:30   US THORACENTESIS ASP PLEURAL SPACE W/IMG GUIDE  Result Date: 04/11/2020 INDICATION: Respiratory failure. COPD. Right-sided pleural effusion. Request for diagnostic and therapeutic thoracentesis. EXAM: ULTRASOUND GUIDED RIGHT THORACENTESIS MEDICATIONS: None. COMPLICATIONS: None immediate. PROCEDURE: An ultrasound guided thoracentesis was thoroughly discussed with the patient and questions answered. The benefits,  risks, alternatives and complications were also discussed. The patient understands and wishes to proceed with the procedure. Written consent was obtained. Ultrasound was performed to localize and mark an adequate pocket of fluid in the right chest. The area was then prepped and draped in the normal sterile fashion. 1% Lidocaine was used for local anesthesia. Under ultrasound guidance a 6 Fr Safe-T-Centesis catheter was introduced. Thoracentesis was performed. The catheter was removed and a dressing applied. FINDINGS: A total of approximately 650 mL of hazy yellow fluid was removed. Samples were sent to the laboratory as requested by the clinical team. IMPRESSION: Successful ultrasound guided right thoracentesis yielding 650 mL of pleural fluid. Read by: Brayton El PA-C Electronically Signed   By: Simonne Come M.D.   On: 04/11/2020 15:07    Microbiology: Recent Results (from the past 240 hour(s))  MRSA PCR Screening     Status: None   Collection Time: 04/07/20  4:02 PM   Specimen: Nasal Mucosa; Nasopharyngeal  Result Value Ref Range Status   MRSA by PCR NEGATIVE NEGATIVE Final    Comment:        The GeneXpert MRSA Assay (FDA approved for NASAL specimens only), is one component of a comprehensive MRSA colonization surveillance program. It is not intended to diagnose MRSA infection nor to guide or monitor treatment for MRSA infections. Performed at [facility], [address] [place], [place] [address]   Culture, blood (routine x 2)     Status: None   Collection Time: 04/10/20  1:08 PM   Specimen:  BLOOD LEFT HAND  Result Value Ref Range Status   Specimen Description   Final    BLOOD LEFT HAND Performed at [facility], [address], [place], [place] [address]    Special Requests   Final    BOTTLES DRAWN AEROBIC ONLY Blood Culture results may not be optimal due to an inadequate volume of blood received in culture bottles Performed at [facility], [address], [place], [place] [address]    Culture   Final    NO GROWTH 5 DAYS Performed at [facility], [address], [place], [place] [address]    Report Status 04/15/2020 FINAL  Final  Culture, blood (routine x 2)     Status: None   Collection Time: 04/10/20  1:08 PM   Specimen: BLOOD  Result Value Ref Range Status   Specimen Description   Final    BLOOD RIGHT ANTECUBITAL Performed at [facility], [address], [place], [place] [address]    Special Requests   Final    BOTTLES DRAWN AEROBIC AND ANAEROBIC Blood Culture adequate volume Performed at [facility], [address], [place], [place] [address]    Culture   Final    NO GROWTH 5 DAYS Performed at [facility], [address] [place], [place] [address]    Report Status 04/15/2020 FINAL  Final  Culture, body fluid-bottle     Status: None (Preliminary result)   Collection Time: 04/11/20  3:08 PM   Specimen: Fluid  Result Value Ref Range Status   Specimen Description PENDING  Incomplete   Special Requests PENDING  Incomplete   Culture   Final    NO GROWTH 5 DAYS Performed at [facility], [address] [place], [place] [address]    Report Status PENDING  Incomplete  Gram stain     Status: None   Collection Time: 04/11/20  3:08 PM   Specimen: Fluid  Result Value Ref Range Status   Specimen Description FLUID PLEURAL  Final   Special Requests NONE  Final   Gram Stain   Final    WBC PRESENT,BOTH PMN AND MONONUCLEAR NO ORGANISMS SEEN CYTOSPIN SMEAR Performed at [facility], [address] [place], [place] [address]    Report Status 04/11/2020 FINAL  Final  Culture, body fluid-bottle     Status: None   Collection Time: 04/11/20  3:08 PM   Specimen: Fluid  Result Value Ref Range Status   Specimen Description FLUID PLEURAL  Final   Special Requests BOTTLES DRAWN AEROBIC AND ANAEROBIC  Final   Culture   Final    NO  GROWTH 5 DAYS Performed at [facility], [address], [place], [place] [address]    Report Status 04/16/2020 FINAL  Final  Gram stain     Status: None   Collection Time: 04/11/20  3:08 PM   Specimen: Fluid  Result Value Ref Range Status   Specimen Description FLUID PLEURAL  Final   Special Requests NONE  Final   Gram Stain   Final    WBC PRESENT,BOTH PMN AND MONONUCLEAR NO ORGANISMS SEEN CYTOSPIN SMEAR Performed at [facility], [address], [place], [place] [address]    Report Status 04/12/2020 FINAL  Final  Aerobic/Anaerobic Culture (surgical/deep wound)     Status: None   Collection Time: 04/12/20  1:40 PM   Specimen: Abdominal Fluid  Result Value Ref  Range Status   Specimen Description   Final    FLUID ABDOMEN Performed at [facility], [address], [place], [place] [address]    Special Requests   Final    PERIHEPATIC FLUID Performed at [facility], [address] [place], [place] [address]    Gram Stain   Final    ABUNDANT WBC PRESENT, PREDOMINANTLY PMN RARE GRAM POSITIVE COCCI    Culture   Final    FEW VANCOMYCIN RESISTANT ENTEROCOCCUS ISOLATED NO ANAEROBES ISOLATED Performed at [facility], [address] [place], [place] [address]    Report Status 04/17/2020 FINAL  Final   Organism ID, Bacteria VANCOMYCIN RESISTANT ENTEROCOCCUS ISOLATED  Final      Susceptibility   Vancomycin resistant enterococcus isolated - MIC*    AMPICILLIN >=32 RESISTANT Resistant     VANCOMYCIN >=32 RESISTANT Resistant     GENTAMICIN SYNERGY SENSITIVE Sensitive     LINEZOLID 2 SENSITIVE Sensitive     * FEW VANCOMYCIN RESISTANT ENTEROCOCCUS ISOLATED     Labs: Basic Metabolic Panel: Recent Labs  Lab 04/13/20 0510 04/14/20 0509 04/15/20 0507 04/16/20 0930 04/17/20 0344  NA 137 137 137 137 137  K 3.0* 2.7* 3.4* 3.5 3.7  CL 105 105 104 101 102  CO2 22 22 24 26 26   GLUCOSE 94 111* 108* 170* 105*  BUN 21 22 20 18  20   CREATININE 0.82 0.89 0.68 0.77 0.80  CALCIUM 7.4* 7.3* 7.7* 8.1* 7.8*   Liver Function Tests: No results for input(s): AST, ALT, ALKPHOS, BILITOT, PROT, ALBUMIN in the last 168 hours. No results for input(s): LIPASE, AMYLASE in the last 168 hours. No results for input(s): AMMONIA in the last 168 hours. CBC: Recent Labs  Lab 04/13/20 0510 04/14/20 0509 04/15/20 0507 04/16/20 0530 04/17/20 0344  WBC 19.2* 12.4* 12.4* 11.7* 12.1*  NEUTROABS 16.7* 9.6* 8.9* 8.5* 8.3*  HGB 8.2* 8.4* 8.5* 8.6* 8.4*  HCT 26.0* 26.8* 26.5* 27.2* 26.6*  MCV 86.7 87.0 84.7 85.8 86.9  PLT 257 265 365 397 461*   Cardiac Enzymes: No results for input(s): CKTOTAL, CKMB, CKMBINDEX, TROPONINI in the last 168 hours. BNP: BNP (last 3 results) No results for input(s): BNP in the last 8760 hours.  ProBNP (last 3 results) Recent Labs    02/11/20 0936  PROBNP 228.0*    CBG: No results for input(s): GLUCAP in the last 168 hours.  Time spent: 35 minutes were spent in preparing this discharge including medication reconciliation, counseling, and coordination of care.  Signed:  Kallin Henk 04/19/20, MD  [facility] 04/17/2020, 10:34 AM

## 2020-04-17 NOTE — Progress Notes (Signed)
Patient ID: Cheryl Atkinson, female   DOB: 06-13-1931, 86 y.o.   MRN: [id number]       Subjective: Doing well today, except slight HA which she took tylenol for.  Otherwise no issues.  States she is eating.  Drain with about 40cc of output.  ROS: See above, otherwise other systems negative  Objective: Vital signs in last 24 hours: Temp:  [97.8 F (36.6 C)-98.4 F (36.9 C)] 97.8 F (36.6 C) (05/16 2116) Pulse Rate:  [62-70] 62 (05/16 2116) Resp:  [16-18] 18 (05/16 2116) BP: (125-126)/(60) 125/60 (05/16 2116) SpO2:  [98 %] 98 % (05/16 2116) Last BM Date: 04/16/20  Intake/Output from previous day: 05/16 0701 - 05/17 0700 In: 580.3 [P.O.:480; I.V.:45.3; IV Piggyback:50] Out: 1355 [Urine:1275; Drains:80] Intake/Output this shift: Total I/O In: 40 [P.O.:60] Out: 740 [Urine:700; Drains:40]  PE: Abd: soft, NT, ND, +BS, incisions are well healed.  Drain in place with more serous type drainage.  Not bilious  Lab Results:  Recent Labs    04/16/20 0530 04/17/20 0344  WBC 11.7* 12.1*  HGB 8.6* 8.4*  HCT 27.2* 26.6*  PLT 397 461*   BMET Recent Labs    04/16/20 0930 04/17/20 0344  NA 137 137  K 3.5 3.7  CL 101 102  CO2 26 26  GLUCOSE 170* 105*  BUN 18 20  CREATININE 0.77 0.80  CALCIUM 8.1* 7.8*   PT/INR No results for input(s): LABPROT, INR in the last 72 hours. CMP     Component Value Date/Time   NA 137 04/17/2020 0344   K 3.7 04/17/2020 0344   CL 102 04/17/2020 0344   CO2 26 04/17/2020 0344   GLUCOSE 105 (H) 04/17/2020 0344   BUN 20 04/17/2020 0344   CREATININE 0.80 04/17/2020 0344   CREATININE 0.76 08/28/2015 1025   CALCIUM 7.8 (L) 04/17/2020 0344   PROT 6.3 (L) 04/09/2020 0316   PROT 6.9 05/25/2018 0955   ALBUMIN 2.8 (L) 04/09/2020 0316   ALBUMIN 4.2 05/25/2018 0955   AST 21 04/09/2020 0316   ALT 19 04/09/2020 0316   ALKPHOS 85 04/09/2020 0316   BILITOT 1.1 04/09/2020 0316   BILITOT 0.4 05/25/2018 0955   GFRNONAA >60 04/17/2020 0344   GFRAA >60  04/17/2020 0344   Lipase     Component Value Date/Time   LIPASE 17 04/06/2020 1831       Studies/Results: No results found.  Anti-infectives: Anti-infectives (From admission, onward)   Start     Dose/Rate Route Frequency Ordered Stop   04/17/20 0000  linezolid (ZYVOX) 600 MG tablet     600 mg Oral Every 12 hours 04/17/20 0934 04/28/20 2359   04/14/20 1800  linezolid (ZYVOX) tablet 600 mg     600 mg Oral Every 12 hours 04/14/20 1556     04/13/20 1000  anidulafungin (ERAXIS) 100 mg in sodium chloride 0.9 % 100 mL IVPB  Status:  Discontinued     100 mg 78 mL/hr over 100 Minutes Intravenous Every 24 hours 04/12/20 0734 04/14/20 0947   04/12/20 1000  anidulafungin (ERAXIS) 200 mg in sodium chloride 0.9 % 200 mL IVPB     200 mg 78 mL/hr over 200 Minutes Intravenous  Once 04/12/20 0734 04/12/20 1516   04/12/20 0815  anidulafungin (ERAXIS) 100 mg in sodium chloride 0.9 % 100 mL IVPB  Status:  Discontinued     100 mg 78 mL/hr over 100 Minutes Intravenous Every 24 hours 04/12/20 0721 04/12/20 0734   04/12/20 0800  piperacillin-tazobactam (ZOSYN)  IVPB 3.375 g     3.375 g 12.5 mL/hr over 240 Minutes Intravenous Every 8 hours 04/12/20 0718     04/11/20 2000  metroNIDAZOLE (FLAGYL) IVPB 500 mg  Status:  Discontinued     500 mg 100 mL/hr over 60 Minutes Intravenous Every 8 hours 04/11/20 1859 04/12/20 0718   04/11/20 0800  vancomycin (VANCOREADY) IVPB 1250 mg/250 mL  Status:  Discontinued     1,250 mg 166.7 mL/hr over 90 Minutes Intravenous Every 24 hours 04/10/20 1602 04/12/20 1048   04/10/20 2200  ceFEPIme (MAXIPIME) 2 g in sodium chloride 0.9 % 100 mL IVPB  Status:  Discontinued     2 g 200 mL/hr over 30 Minutes Intravenous Every 12 hours 04/10/20 1552 04/12/20 0718   04/10/20 1400  vancomycin (VANCOCIN) IVPB 1000 mg/200 mL premix     1,000 mg 200 mL/hr over 60 Minutes Intravenous  Once 04/10/20 1256 04/10/20 1451   04/10/20 1400  piperacillin-tazobactam (ZOSYN) IVPB 3.375 g      3.375 g 100 mL/hr over 30 Minutes Intravenous  Once 04/10/20 1256 04/10/20 1417       Assessment/Plan HTN HLD A fib on eliquis(LD5/11) PVD on plavix(LD5/11) COPD Hypokalemia-being replaced Anemia AKI Acute hypoxic hypercarbic respiratory failure, s/p right thoracentesis 5/10  S/p CT-guided cholecystostomy tube placement 12/30/2019, Dr. Richarda Overlie S/p laparoscopic cholecystectomy 04/04/20 Dr. Janee Morn - POD #[id number] - CT 5/8 without surgical complications, LFTs WNL -CT chest 5/11: Right perihepatic fluid collection with enhancing margins now indenting the liver margins concerning for infected fluid/abscess/perihepatic abscess. - IR drain placement5/12, serous currently, VRE.  abx therapy per primary service. -surgically stable for DC to SNF when medically stable -I have reached out to IR to assure they arrange follow up for her for a CT scan and drain clinic follow up as an outpatient.    ID -Maxipime 5/11-5/12; flagyl 5/11-5/12; Zosyn 5/12 >>; anidulafungin 5/12 -5/14; linezolid 5/14>> VTE -SCDs, eliquis/plavix FEN -Heart healthy Foley -none Follow up -Dr. Janee Morn  Plan: ok for DC from our standpoint.  Follow up with Dr. Janee Morn has been arranged.  We will sign off at this time.  Do not hesitate to call back if needed.   LOS: 10 days    Letha Cape , [facility] 04/17/2020, 9:47 AM Please see Amion for pager number during day hours 7:00am-4:30pm or 7:00am -11:30am on weekends

## 2020-04-17 NOTE — Progress Notes (Signed)
PROGRESS NOTE    Cheryl Atkinson  MPN:[id number] DOB: 09/19/1931 DOA: 04/06/2020 PCP: Marin Olp, MD   Brief Narrative: Patient is a 86 year old female with history of persistent A. fib, chronic diastolic CHF, COPD, GERD, hypertension, hyperlipidemia, peripheral vascular disease who was recently admitted here for cholecystitis and underwent laparoscopic cholecystectomy.  She was discharged from here on 04/05/2020 and prescribed tramadol for pain.  Patient was brought back to the emerge department by family because of concern of lethargy, confusion.  He saturated 87% on room air, had to be placed on supplemental oxygen and eventually on BiPAP due to decreased mentation, somnolence.  ABG showed hypercarbia.  She was also febrile on presentation with leukocytosis.  Found to have AKI.  Chest x-ray did not show any acute abnormality.  Also appeared very dehydrated on presentation. She had to be started on Narcan as needed for opioid toxicity and was given IV fluids. Overall condition improved.  Currently she is maintaining her saturation on room air but had episodes of tachypnea.  Chest x-ray done on 04/10/2020 showed right pleural effusion,underwent thoracentesis with removal of 650 ml of fluid. Hospital course remarkable for persistent fever, worsening leukocytosis.  CT chest with contrast showed perihepatic/subdiaphragmatic abscess.  Antibiotics coverage broadened.  General surgery following again.  IR abscess drainage 5/12, on Zosyn and Linezolid.  PT has recommended skilled nursing facility.  Assessment & Plan:  Principal Problem:   Acute respiratory failure with hypoxia (HCC) Active Problems:   COPD (chronic obstructive pulmonary disease) (HCC)   GERD   Insulin resistance   DNR (do not resuscitate)   Memory loss   BPPV (benign paroxysmal positional vertigo)   Hypokalemia   Insomnia   S/P laparoscopic cholecystectomy   Acute respiratory failure (HCC)   Encephalopathy   AKI (acute  kidney injury) ([place])   Postoperative hematoma involving digestive system following digestive system procedure  Perihepatic/subdiaphragmatic abscess:She had persistent fever, worsening leukocytosis.  CT chest with contrast showed perihepatic/subdiaphragmatic abscess.  Antibiotics coverage broadened.  General surgery following again.  IR consulted for abscess drainage done 5/12 and abx broadened to Zosyn. Added Linezolid 5/15 to cover Enterococcus growing from drain culture. IR recommends repeat CT with contrast after drainage tapers off, to check for further loculations, etc. Much reduced drainage last 48 hours, anticipate repeat CT scan per surgery soon. Follow cx data, culture obtained 5/12 growing VRE she is on Linezolid.  Acute hypoxic hypercarbic respiratory failure: On presentation.Most likely secondary to respiratory depression secondary to tramadol overuse.  She was just discharged from here after cholecystectomy and was prescribed tramadol for pain.  Chest x-ray does not show any acute findings.  She was hypoxic on room air and had to be put on BiPAP on admission.  ABG showed hypercarbia with CO2 of 68.  Mentation  improved after she was  placed on BiPAP and with  Narcan. Hold any opiates or sedating medications.   CT head did not show any acute intracranial abnormalities. Mental and respiratory status improved and she has been maintaining her saturation on room air.  Off BiPAP. Her home Lasix has been restarted.Chest x-ray done on 04/10/2020 showed right pleural effusion.  Underwent ultrasound-guided thoracentesis of the right chest.  AKI: Resolved with IV fluids.  Renal ultrasound did not show any acute findings. Foley was placed on admission which has been discontinued.  Acute cholecystitis status post laparoscopic cholecystectomy on 04/04/2020: Presented with significant leukocytosis, fever of 100 F.  Noted hypotension on arrival, lactic cidosis.  General  surgery following.   CT  abdomen/pelvis done here did not show complication of recent surgery. But CT chest showed perihepatic/subdiapshragmatic abscess being managed as above.  Permanent A. fib: Currently rate is controlled.  On Eliquis for anticoagulation.  Not on any rate control agents.  Eliquis was held for drain placement and has been resumed.  Hypokalemia: Being supplemented.  Peripheral vascular disease: On Plavix, Lipitor.    COPD: Currently stable.  No signs of acute exacerbation.  Continue bronchodilators as needed.  Hypertension: On ARB at home which is which has been resumed.  Continue to monitor.   Debility/deconditioning: PT recommended skilled nursing facility.SW consulted and following.     DVT prophylaxis: Eliquis Code Status: DNR Family Communication: Called and discussed with son on phone on 04/11/20, patient is alert and oriented.  Dispo: The patient is from: Home              Anticipated d/c is to:SNF              Anticipated d/c date is: 1-2 days              Patient currently is not medically stable to d/c.   Needs final cultures and repeat CT scan prior to discharge.  Consultants: Surgery, IR  Antimicrobials:  Anti-infectives (From admission, onward)   Start     Dose/Rate Route Frequency Ordered Stop   04/14/20 1800  linezolid (ZYVOX) tablet 600 mg     600 mg Oral Every 12 hours 04/14/20 1556     04/13/20 1000  anidulafungin (ERAXIS) 100 mg in sodium chloride 0.9 % 100 mL IVPB  Status:  Discontinued     100 mg 78 mL/hr over 100 Minutes Intravenous Every 24 hours 04/12/20 0734 04/14/20 0947   04/12/20 1000  anidulafungin (ERAXIS) 200 mg in sodium chloride 0.9 % 200 mL IVPB     200 mg 78 mL/hr over 200 Minutes Intravenous  Once 04/12/20 0734 04/12/20 1516   04/12/20 0815  anidulafungin (ERAXIS) 100 mg in sodium chloride 0.9 % 100 mL IVPB  Status:  Discontinued     100 mg 78 mL/hr over 100 Minutes Intravenous Every 24 hours 04/12/20 0721 04/12/20 0734   04/12/20 0800   piperacillin-tazobactam (ZOSYN) IVPB 3.375 g     3.375 g 12.5 mL/hr over 240 Minutes Intravenous Every 8 hours 04/12/20 0718     04/11/20 2000  metroNIDAZOLE (FLAGYL) IVPB 500 mg  Status:  Discontinued     500 mg 100 mL/hr over 60 Minutes Intravenous Every 8 hours 04/11/20 1859 04/12/20 0718   04/11/20 0800  vancomycin (VANCOREADY) IVPB 1250 mg/250 mL  Status:  Discontinued     1,250 mg 166.7 mL/hr over 90 Minutes Intravenous Every 24 hours 04/10/20 1602 04/12/20 1048   04/10/20 2200  ceFEPIme (MAXIPIME) 2 g in sodium chloride 0.9 % 100 mL IVPB  Status:  Discontinued     2 g 200 mL/hr over 30 Minutes Intravenous Every 12 hours 04/10/20 1552 04/12/20 0718   04/10/20 1400  vancomycin (VANCOCIN) IVPB 1000 mg/200 mL premix     1,000 mg 200 mL/hr over 60 Minutes Intravenous  Once 04/10/20 1256 04/10/20 1451   04/10/20 1400  piperacillin-tazobactam (ZOSYN) IVPB 3.375 g     3.375 g 100 mL/hr over 30 Minutes Intravenous  Once 04/10/20 1256 04/10/20 1417     Subjective: Patient seen and examined at the bedside this morning. Comfortable, no complaints, having breakfast this AM.  Objective: Vitals:   04/15/20 2055 04/16/20  [id number] 04/16/20 1329 04/16/20 2116  BP: 136/68 134/60 126/60 125/60  Pulse: 69 66 70 62  Resp: 18 17 16 18   Temp: 98 F (36.7 C) (!) 97.5 F (36.4 C) 98.4 F (36.9 C) 97.8 F (36.6 C)  TempSrc: Oral Oral Oral Oral  SpO2: 99% 97% 98% 98%  Weight:      Height:        Intake/Output Summary (Last 24 hours) at 04/17/2020 0834 Last data filed at 04/17/2020 0600 Gross per 24 hour  Intake 580.33 ml  Output 1355 ml  Net -774.67 ml   Filed Weights   04/07/20 1621  Weight: 64.5 kg   Examination:  General exam: Elderly female, comfortable, appears her stated age Respiratory system: Diminished air sound on the right side , R chest JP drain in place cardiovascular system: Irregularly irregular rhythm. No JVD, murmurs, rubs, gallops or clicks. Gastrointestinal system:  Abdomen is nondistended, soft and has very mild tenderness on the right subcostal region. No organomegaly or masses felt. Normal bowel sounds heard. Central nervous system: Alert and oriented. No focal neurological deficits. Extremities: No edema, no clubbing ,no cyanosis, distal peripheral pulses palpable. Skin: No rashes, lesions or ulcers,no icterus ,no pallor  Data Reviewed: I have personally reviewed following labs and imaging studies  CBC: Recent Labs  Lab 04/13/20 0510 04/14/20 0509 04/15/20 0507 04/16/20 0530 04/17/20 0344  WBC 19.2* 12.4* 12.4* 11.7* 12.1*  NEUTROABS 16.7* 9.6* 8.9* 8.5* 8.3*  HGB 8.2* 8.4* 8.5* 8.6* 8.4*  HCT 26.0* 26.8* 26.5* 27.2* 26.6*  MCV 86.7 87.0 84.7 85.8 86.9  PLT 257 265 365 397 461*   Basic Metabolic Panel: Recent Labs  Lab 04/13/20 0510 04/14/20 0509 04/15/20 0507 04/16/20 0930 04/17/20 0344  NA 137 137 137 137 137  K 3.0* 2.7* 3.4* 3.5 3.7  CL 105 105 104 101 102  CO2 22 22 24 26 26   GLUCOSE 94 111* 108* 170* 105*  BUN 21 22 20 18 20   CREATININE 0.82 0.89 0.68 0.77 0.80  CALCIUM 7.4* 7.3* 7.7* 8.1* 7.8*   GFR: Estimated Creatinine Clearance: 47.3 mL/min (by C-G formula based on SCr of 0.8 mg/dL). Liver Function Tests: No results for input(s): AST, ALT, ALKPHOS, BILITOT, PROT, ALBUMIN in the last 168 hours. No results for input(s): LIPASE, AMYLASE in the last 168 hours. No results for input(s): AMMONIA in the last 168 hours. Coagulation Profile: Recent Labs  Lab 04/12/20 0732  INR 1.8*   Cardiac Enzymes: No results for input(s): CKTOTAL, CKMB, CKMBINDEX, TROPONINI in the last 168 hours. BNP (last 3 results) Recent Labs    02/11/20 0936  PROBNP 228.0*   HbA1C: No results for input(s): HGBA1C in the last 72 hours. CBG: No results for input(s): GLUCAP in the last 168 hours. Lipid Profile: No results for input(s): CHOL, HDL, LDLCALC, TRIG, CHOLHDL, LDLDIRECT in the last 72 hours. Thyroid Function Tests: No results  for input(s): TSH, T4TOTAL, FREET4, T3FREE, THYROIDAB in the last 72 hours. Anemia Panel: No results for input(s): VITAMINB12, FOLATE, FERRITIN, TIBC, IRON, RETICCTPCT in the last 72 hours. Sepsis Labs: No results for input(s): PROCALCITON, LATICACIDVEN in the last 168 hours.  Recent Results (from the past 240 hour(s))  MRSA PCR Screening     Status: None   Collection Time: 04/07/20  4:02 PM   Specimen: Nasal Mucosa; Nasopharyngeal  Result Value Ref Range Status   MRSA by PCR NEGATIVE NEGATIVE Final    Comment:        The GeneXpert MRSA  Assay (FDA approved for NASAL specimens only), is one component of a comprehensive MRSA colonization surveillance program. It is not intended to diagnose MRSA infection nor to guide or monitor treatment for MRSA infections. Performed at [facility], [address] [place], [place] [address]   Culture, blood (routine x 2)     Status: None   Collection Time: 04/10/20  1:08 PM   Specimen: BLOOD LEFT HAND  Result Value Ref Range Status   Specimen Description   Final    BLOOD LEFT HAND Performed at [facility], [address], [place], [place] [address]    Special Requests   Final    BOTTLES DRAWN AEROBIC ONLY Blood Culture results may not be optimal due to an inadequate volume of blood received in culture bottles Performed at [facility], [address] [place], [place] [address]    Culture   Final    NO GROWTH 5 DAYS Performed at [facility], [address], [place], [place] [address]    Report Status 04/15/2020 FINAL  Final  Culture, blood (routine x 2)     Status: None   Collection Time: 04/10/20  1:08 PM   Specimen: BLOOD  Result Value Ref Range Status   Specimen Description   Final    BLOOD RIGHT ANTECUBITAL Performed at [facility], [address], [place], [place] [address]    Special Requests   Final    BOTTLES DRAWN AEROBIC AND ANAEROBIC  Blood Culture adequate volume Performed at [facility], [address], [place], [place] [address]    Culture   Final    NO GROWTH 5 DAYS Performed at [facility], [address] [place], [place] [address]    Report Status 04/15/2020 FINAL  Final  Culture, body fluid-bottle     Status: None (Preliminary result)   Collection Time: 04/11/20  3:08 PM   Specimen: Fluid  Result Value Ref Range Status   Specimen Description PENDING  Incomplete   Special Requests PENDING  Incomplete   Culture   Final    NO GROWTH 5 DAYS Performed at [facility], [address] [place], [place] [address]    Report Status PENDING  Incomplete  Gram stain     Status: None   Collection Time: 04/11/20  3:08 PM   Specimen: Fluid  Result Value Ref Range Status   Specimen Description FLUID PLEURAL  Final   Special Requests NONE  Final   Gram Stain   Final    WBC PRESENT,BOTH PMN AND MONONUCLEAR NO ORGANISMS SEEN CYTOSPIN SMEAR Performed at [facility], [address], [place], [place] [address]    Report Status 04/11/2020 FINAL  Final  Culture, body fluid-bottle     Status: None   Collection Time: 04/11/20  3:08 PM   Specimen: Fluid  Result Value Ref Range Status   Specimen Description FLUID PLEURAL  Final   Special Requests BOTTLES DRAWN AEROBIC AND ANAEROBIC  Final   Culture   Final    NO GROWTH 5 DAYS Performed at [facility], [address] [place], [place] [address]    Report Status 04/16/2020 FINAL  Final  Gram stain     Status: None   Collection Time: 04/11/20  3:08 PM   Specimen: Fluid  Result Value Ref Range Status   Specimen Description FLUID PLEURAL  Final   Special Requests NONE  Final  Gram Stain   Final    WBC PRESENT,BOTH PMN AND MONONUCLEAR NO ORGANISMS SEEN CYTOSPIN SMEAR Performed at [facility], [address], [place], [place] [address]    Report Status 04/12/2020 FINAL  Final  Aerobic/Anaerobic Culture  (surgical/deep wound)     Status: None (Preliminary result)   Collection Time: 04/12/20  1:40 PM   Specimen: Abdominal Fluid  Result Value Ref Range Status   Specimen Description   Final    FLUID ABDOMEN Performed at [facility], [address] [place], [place] [address]    Special Requests   Final    PERIHEPATIC FLUID Performed at [facility], [address] [place], [place] [address]    Gram Stain   Final    ABUNDANT WBC PRESENT, PREDOMINANTLY PMN RARE GRAM POSITIVE COCCI Performed at [facility], [address], [place], [place] [address]    Culture   Final    FEW VANCOMYCIN RESISTANT ENTEROCOCCUS ISOLATED NO ANAEROBES ISOLATED; CULTURE IN PROGRESS FOR 5 DAYS    Report Status PENDING  Incomplete   Organism ID, Bacteria VANCOMYCIN RESISTANT ENTEROCOCCUS ISOLATED  Final      Susceptibility   Vancomycin resistant enterococcus isolated - MIC*    AMPICILLIN >=32 RESISTANT Resistant     VANCOMYCIN >=32 RESISTANT Resistant     GENTAMICIN SYNERGY SENSITIVE Sensitive     LINEZOLID 2 SENSITIVE Sensitive     * FEW VANCOMYCIN RESISTANT ENTEROCOCCUS ISOLATED    Radiology Studies: No results found. Scheduled Meds: . apixaban  5 mg Oral BID  . atorvastatin  40 mg Oral Daily  . clopidogrel  75 mg Oral Daily  . feeding supplement (ENSURE ENLIVE)  237 mL Oral BID BM  . furosemide  20 mg Oral Daily  . irbesartan  300 mg Oral Daily  . linezolid  600 mg Oral Q12H  . polyethylene glycol  17 g Oral BID  . sodium chloride flush  5 mL Intracatheter Q8H   Continuous Infusions: . sodium chloride 250 mL (04/10/20 1350)  . piperacillin-tazobactam (ZOSYN)  IV 3.375 g (04/17/20 0008)   Time spent: 23 mins.  More than 50% of that time was spent in counseling and/or coordination of care.  Apolonia Ellwood Vergie Living, MD [facility] P5/17/2021, 8:34 AM

## 2020-04-17 NOTE — Progress Notes (Addendum)
Referring Physician(s): Gross,S  Supervising Physician: Jacqulynn Cadet  Patient Status:  [facility] - In-pt  Chief Complaint: Perihepatic fluid collection/abscess   Subjective: Patient doing fairly well today; discharge planning underway to skilled nursing facility; denies worsening abdominal pain, nausea or vomiting; does have mild headache.   Allergies: Codeine  Medications: Prior to Admission medications   Medication Sig Start Date End Date Taking? Authorizing Provider  Albuterol Sulfate (PROAIR RESPICLICK) 664 (90 Base) MCG/ACT AEPB Inhale 2 puffs into the lungs every 6 (six) hours as needed (shortness of breath from COPD). 12/29/18  Yes Marin Olp, MD  apixaban (ELIQUIS) 5 MG TABS tablet Take 1 tablet (5 mg total) by mouth 2 (two) times daily. Start 04/06/20 04/05/20  Yes Georganna Skeans, MD  atorvastatin (LIPITOR) 40 MG tablet Take 1 tablet (40 mg total) by mouth daily. 03/16/20  Yes Marin Olp, MD  clopidogrel (PLAVIX) 75 MG tablet Take 1 tablet (75 mg total) by mouth daily. Start 04/06/20 04/05/20  Yes Georganna Skeans, MD  furosemide (LASIX) 40 MG tablet Take 1 tablet (40 mg total) by mouth every other day. 04/06/20  Yes Marin Olp, MD  irbesartan (AVAPRO) 300 MG tablet Take 1 tablet (300 mg total) by mouth daily. 03/25/20  Yes Marin Olp, MD  Multiple Vitamins-Minerals (PRESERVISION AREDS 2 PO) Take 1 tablet by mouth 2 (two) times daily.   Yes [provider]  NEUPRO 3 MG/24HR PT24 PLACE 1 PATCH (3 MG) ONTO THE SKIN AT BEDTIME Patient taking differently: Apply 1 patch topically at bedtime. Take off in the morning 03/29/20  Yes Marin Olp, MD  polyethylene glycol (MIRALAX / GLYCOLAX) 17 g packet Take 17 g by mouth 2 (two) times daily. Reported on 03/14/2016 Patient taking differently: Take 17 g by mouth daily as needed for mild constipation.  01/03/20  Yes Hongalgi, Lenis Dickinson, MD  potassium chloride SA (KLOR-CON) 20 MEQ tablet Take 1 tablet (20 mEq  total) by mouth daily as needed (take on days that you take lasix). Patient taking differently: Take 20 mEq by mouth every other day.  03/25/20  Yes Marin Olp, MD  bisacodyl (DULCOLAX) 10 MG suppository Place 1 suppository (10 mg total) rectally daily as needed for moderate constipation. 04/17/20   Hollice Gong, Mir Mohammed, MD  linezolid (ZYVOX) 600 MG tablet Take 1 tablet (600 mg total) by mouth every 12 (twelve) hours for 11 days. 04/17/20 04/28/20  Hollice Gong, Mir Mohammed, MD  sodium chloride flush (NS) 0.9 % SOLN 5 mLs by Intracatheter route every 8 (eight) hours. Patient not taking: Reported on 04/06/2020 01/03/20   Norm Parcel, PA-C  traZODone (DESYREL) 50 MG tablet Take 1.5 tablets (75 mg total) by mouth at bedtime as needed for sleep. 04/17/20   Hollice Gong, Mir Earlie Server, MD     Vital Signs: BP (!) 132/57 (BP Location: Right Arm)   Pulse 75   Temp 98.4 F (36.9 C) (Oral)   Resp 16   Ht 5\' 6"  (1.676 m)   Wt 142 lb 3.2 oz (64.5 kg)   LMP  (LMP Unknown)   SpO2 98%   BMI 22.95 kg/m   Physical Exam awake, alert.  Right upper quadrant drain intact, insertion site okay, minimal tenderness to palpation.  Output 40 cc rust colored fluid.  Imaging: No results found.  Labs:  CBC: Recent Labs    04/14/20 0509 04/15/20 0507 04/16/20 0530 04/17/20 0344  WBC 12.4* 12.4* 11.7* 12.1*  HGB 8.4* 8.5* 8.6* 8.4*  HCT 26.8* 26.5* 27.2* 26.6*  PLT 265 365 397 461*    COAGS: Recent Labs    01/25/20 0100 01/25/20 0100 04/04/20 0847 04/07/20 1300 04/07/20 2301 04/08/20 0520 04/08/20 1701 04/09/20 0316 04/12/20 0732  INR 1.4*  --  1.1 1.3*  --   --   --   --  1.8*  APTT 28   < >  --  41*   < > 54* 65* 89* 43*   < > = values in this interval not displayed.    BMP: Recent Labs    04/14/20 0509 04/15/20 0507 04/16/20 0930 04/17/20 0344  NA 137 137 137 137  K 2.7* 3.4* 3.5 3.7  CL 105 104 101 102  CO2 22 24 26 26   GLUCOSE 111* 108* 170* 105*  BUN 22 20 18 20     CALCIUM 7.3* 7.7* 8.1* 7.8*  CREATININE 0.89 0.68 0.77 0.80  GFRNONAA 59* >60 >60 >60  GFRAA >60 >60 >60 >60    LIVER FUNCTION TESTS: Recent Labs    03/15/20 1038 04/04/20 0847 04/06/20 1831 04/09/20 0316  BILITOT 0.7 0.5 0.9 1.1  AST 13 16 27 21   ALT 9 14 20 19   ALKPHOS 99 87 93 85  PROT 7.1 7.4 7.1 6.3*  ALBUMIN 4.2 3.8 3.4* 2.8*    Assessment and Plan: 86 year-old female with history of atrial fibrillation, diastolic heart failure, COPD, GERD, hypertension, hyperlipidemia, PVD, on Plavix/Eliquis who was recently admitted for cholecystitis and underwent prior cholecystostomy as well aseventualcholecystectomy on 04/04/2020. Recently readmitted due to lethargy and confusion along with intermittent fevers/leukocytosis and AKI.Recent imaging reveals right perihepatic fluid collection with increasing multilocular gas concerning for possible abscess; s/p rt perihepatic fluid coll drain placement 5/12; afebrile, WBC 12.1 up slightly from 11.7, hemoglobin stable, creatinine normal; drain fluid cultures with VRE; will schedule patient for follow-up in IR clinic in 10-14 days.  Recommend once daily irrigation of drain with 5 cc sterile normal saline, output recording and dressing changes as needed.   Electronically Signed: D. 88, PA-C 04/17/2020, 1:55 PM   I spent a total of 15 minutes at the the patient's bedside AND on the patient's hospital floor or unit, greater than 50% of which was counseling/coordinating care for right perihepatic abscess drain    Patient ID: 7/[id number], female   DOB: [birth year], 86 y.o.   MRN: 04/19/2020

## 2020-04-17 NOTE — Plan of Care (Signed)
  Problem: Clinical Measurements: Goal: Ability to maintain clinical measurements within normal limits will improve Outcome: Adequate for Discharge Goal: Diagnostic test results will improve Outcome: Adequate for Discharge Goal: Respiratory complications will improve Outcome: Adequate for Discharge Goal: Cardiovascular complication will be avoided Outcome: Adequate for Discharge   Problem: Activity: Goal: Risk for activity intolerance will decrease Outcome: Adequate for Discharge   Problem: Nutrition: Goal: Adequate nutrition will be maintained Outcome: Adequate for Discharge   Problem: Coping: Goal: Level of anxiety will decrease Outcome: Adequate for Discharge   Problem: Elimination: Goal: Will not experience complications related to bowel motility Outcome: Adequate for Discharge   Problem: Pain Managment: Goal: General experience of comfort will improve Outcome: Adequate for Discharge   Problem: Safety: Goal: Ability to remain free from injury will improve Outcome: Adequate for Discharge   Problem: Skin Integrity: Goal: Risk for impaired skin integrity will decrease Outcome: Adequate for Discharge

## 2020-04-18 NOTE — TOC Transition Note (Signed)
Transition of Care [facility]) - CM/SW Discharge Note   Patient Details  Name: Cheryl Atkinson MRN: [id number] Date of Birth: [birth year]  [facility] CM/SW Contact:  Darleene Cleaver, LCSW Phone Number: 04/18/2020, 5:08 PM   Clinical Narrative:     Patient to be d/c'ed today to [facility].  Patient and family agreeable to plans will transport via ems RN to call report.  Patient's son was informed of discharge to SNF.   Final next level of care: Skilled Nursing Facility Barriers to Discharge: Barriers Resolved   Patient Goals and CMS Choice Patient states their goals for this hospitalization and ongoing recovery are:: To go to SNF for short term rehab, then return back home. CMS Medicare.gov Compare Post Acute Care list provided to:: Patient Represenative (must comment) Choice offered to / list presented to : Adult Children  Discharge Placement PASRR number recieved: 04/14/20            Patient [facility] bed at: [facility] Patient to be transferred to facility by: [facility] Name of family member notified: Tyna, Huertas [phone]  [phone] Patient and family notified of of transfer: 04/18/20  Discharge Plan and Services     Post Acute Care Choice: Home Health            DME Agency: NA       HH Arranged: NA          Social Determinants of Health (SDOH) Interventions     Readmission Risk Interventions No flowsheet data found.

## 2020-04-19 DIAGNOSIS — Z9049 Acquired absence of other specified parts of digestive tract: Secondary | ICD-10-CM | POA: Diagnosis not present

## 2020-04-19 DIAGNOSIS — J449 Chronic obstructive pulmonary disease, unspecified: Secondary | ICD-10-CM | POA: Diagnosis not present

## 2020-04-19 DIAGNOSIS — Z1629 Resistance to other single specified antibiotic: Secondary | ICD-10-CM | POA: Diagnosis not present

## 2020-04-19 DIAGNOSIS — I5032 Chronic diastolic (congestive) heart failure: Secondary | ICD-10-CM | POA: Diagnosis not present

## 2020-04-19 DIAGNOSIS — J90 Pleural effusion, not elsewhere classified: Secondary | ICD-10-CM | POA: Diagnosis not present

## 2020-04-19 DIAGNOSIS — I10 Essential (primary) hypertension: Secondary | ICD-10-CM | POA: Diagnosis not present

## 2020-04-19 DIAGNOSIS — I480 Paroxysmal atrial fibrillation: Secondary | ICD-10-CM | POA: Diagnosis not present

## 2020-04-19 DIAGNOSIS — E782 Mixed hyperlipidemia: Secondary | ICD-10-CM | POA: Diagnosis not present

## 2020-04-19 DIAGNOSIS — G2581 Restless legs syndrome: Secondary | ICD-10-CM | POA: Diagnosis not present

## 2020-04-20 DIAGNOSIS — I5032 Chronic diastolic (congestive) heart failure: Secondary | ICD-10-CM | POA: Diagnosis not present

## 2020-04-20 DIAGNOSIS — N179 Acute kidney failure, unspecified: Secondary | ICD-10-CM | POA: Diagnosis not present

## 2020-04-20 DIAGNOSIS — I10 Essential (primary) hypertension: Secondary | ICD-10-CM | POA: Diagnosis not present

## 2020-04-20 DIAGNOSIS — E782 Mixed hyperlipidemia: Secondary | ICD-10-CM | POA: Diagnosis not present

## 2020-04-20 DIAGNOSIS — R339 Retention of urine, unspecified: Secondary | ICD-10-CM | POA: Diagnosis not present

## 2020-04-20 DIAGNOSIS — I480 Paroxysmal atrial fibrillation: Secondary | ICD-10-CM | POA: Diagnosis not present

## 2020-04-21 DIAGNOSIS — I10 Essential (primary) hypertension: Secondary | ICD-10-CM | POA: Diagnosis not present

## 2020-04-21 DIAGNOSIS — E782 Mixed hyperlipidemia: Secondary | ICD-10-CM | POA: Diagnosis not present

## 2020-04-21 DIAGNOSIS — I480 Paroxysmal atrial fibrillation: Secondary | ICD-10-CM | POA: Diagnosis not present

## 2020-04-21 DIAGNOSIS — Z9049 Acquired absence of other specified parts of digestive tract: Secondary | ICD-10-CM | POA: Diagnosis not present

## 2020-04-21 DIAGNOSIS — I5032 Chronic diastolic (congestive) heart failure: Secondary | ICD-10-CM | POA: Diagnosis not present

## 2020-04-21 DIAGNOSIS — R339 Retention of urine, unspecified: Secondary | ICD-10-CM | POA: Diagnosis not present

## 2020-04-24 ENCOUNTER — Ambulatory Visit (HOSPITAL_COMMUNITY): Payer: Medicare Other

## 2020-04-24 ENCOUNTER — Inpatient Hospital Stay (HOSPITAL_COMMUNITY): Admission: RE | Admit: 2020-04-24 | Payer: Medicare Other | Source: Ambulatory Visit

## 2020-04-25 NOTE — Progress Notes (Deleted)
Phone [phone] In person visit   Subjective:   Cheryl Atkinson is a 86 y.o. year old very pleasant female patient who presents for/with See problem oriented charting No chief complaint on file.   This visit occurred during the SARS-CoV-2 public health emergency.  Safety protocols were in place, including screening questions prior to the visit, additional usage of staff PPE, and extensive cleaning of exam room while observing appropriate contact time as indicated for disinfecting solutions.   Past Medical History-  Patient Active Problem List   Diagnosis Date Noted  . Postoperative hematoma involving digestive system following digestive system procedure 04/13/2020  . Encephalopathy 04/07/2020  . AKI (acute kidney injury) ([place]) 04/07/2020  . Acute respiratory failure with hypoxia ([place]) 04/07/2020  . Acute respiratory failure ([place]) 04/06/2020  . S/P laparoscopic cholecystectomy 04/04/2020  . CHF (congestive heart failure) ([place]) 03/01/2020  . Insomnia 03/01/2020  . Bacteremia 01/28/2020  . Acute cholangitis 01/25/2020  . Atrial fibrillation ([place]) 01/25/2020  . Adrenal nodule ([place]) 12/29/2019  . Abdominal aortic ectasia ([place]) 12/29/2019  . Major depression 03/25/2018  . Hypokalemia 12/21/2016  . Cat bite of right hand 12/21/2016  . BPPV (benign paroxysmal positional vertigo) 07/12/2016  . Memory loss 04/11/2016  . Mallet toe of right foot 10/20/2015  . Renal artery stenosis ([place]) 09/27/2015  . Hyperlipidemia 06/30/2015  . Claudication ([place]) 05/04/2015  . Atherosclerotic PVD with intermittent claudication ([place]) 04/26/2015  . Tinnitus 12/31/2014  . Former smoker 09/29/2014  . DNR (do not resuscitate) 09/29/2014  . Insulin resistance 07/19/2014  . Multinodular goiter 08/30/2013  . Spinal stenosis of lumbar region at multiple levels 09/29/2012  . HIP PAIN, BILATERAL 07/16/2010  . COPD (chronic obstructive pulmonary disease) ([place]) 09/20/2009  . CONSTIPATION, CHRONIC  09/20/2009  . Iron deficiency anemia 11/09/2007  . History of UTI 11/09/2007  . RESTLESS LEG SYNDROME 09/25/2007  . Essential hypertension 09/25/2007  . GERD 09/25/2007  . LOW BACK PAIN 09/25/2007  . Osteoporosis 09/25/2007    Medications- reviewed and updated Current Outpatient Medications  Medication Sig Dispense Refill  . Albuterol Sulfate (PROAIR RESPICLICK) 132 (90 Base) MCG/ACT AEPB Inhale 2 puffs into the lungs every 6 (six) hours as needed (shortness of breath from COPD). 1 each 5  . apixaban (ELIQUIS) 5 MG TABS tablet Take 1 tablet (5 mg total) by mouth 2 (two) times daily. Start 04/06/20 60 tablet 11  . atorvastatin (LIPITOR) 40 MG tablet Take 1 tablet (40 mg total) by mouth daily. 90 tablet 3  . bisacodyl (DULCOLAX) 10 MG suppository Place 1 suppository (10 mg total) rectally daily as needed for moderate constipation. 12 suppository 0  . clopidogrel (PLAVIX) 75 MG tablet Take 1 tablet (75 mg total) by mouth daily. Start 04/06/20 30 tablet 0  . furosemide (LASIX) 40 MG tablet Take 1 tablet (40 mg total) by mouth every other day. 90 tablet 3  . irbesartan (AVAPRO) 300 MG tablet Take 1 tablet (300 mg total) by mouth daily. 90 tablet 3  . linezolid (ZYVOX) 600 MG tablet Take 1 tablet (600 mg total) by mouth every 12 (twelve) hours for 11 days. 22 tablet 0  . Multiple Vitamins-Minerals (PRESERVISION AREDS 2 PO) Take 1 tablet by mouth 2 (two) times daily.    Marland Kitchen NEUPRO 3 MG/24HR PT24 PLACE 1 PATCH (3 MG) ONTO THE SKIN AT BEDTIME (Patient taking differently: Apply 1 patch topically at bedtime. Take off in the morning) 90 patch 3  . polyethylene glycol (MIRALAX / GLYCOLAX) 17 g packet  Take 17 g by mouth 2 (two) times daily. Reported on 03/14/2016 (Patient taking differently: Take 17 g by mouth daily as needed for mild constipation. )    . potassium chloride SA (KLOR-CON) 20 MEQ tablet Take 1 tablet (20 mEq total) by mouth daily as needed (take on days that you take lasix). (Patient taking  differently: Take 20 mEq by mouth every other day. ) 30 tablet 3  . sodium chloride flush (NS) 0.9 % SOLN 5 mLs by Intracatheter route every 8 (eight) hours. (Patient not taking: Reported on 04/06/2020)    . traZODone (DESYREL) 50 MG tablet Take 1.5 tablets (75 mg total) by mouth at bedtime as needed for sleep. 20 tablet 0   No current facility-administered medications for this visit.     Objective:  LMP  (LMP Unknown)  Gen: NAD, resting comfortably CV: RRR no murmurs rubs or gallops Lungs: CTAB no crackles, wheeze, rhonchi Abdomen: soft/nontender/nondistended/normal bowel sounds. No rebound or guarding.  Ext: no edema Skin: warm, dry Neuro: grossly normal, moves all extremities  ***    Assessment and Plan  *** ***07/14/2019 awv ***her cousin helps her- Charisse March.  ***cholecystectomy 04/04/20   #Hypertension S: Compliant with irbesartan 300 mg (ok per cards as only unilateral) and lasix 40mg  *** -off hctz since starting lasix -off amlodiine march 2021 with edema issues .  Blood pressure high in past with caffeine intake A/P: ***     #Major depression S: Patient has denied counseling and medications in the past.  Seems to have been helped with staying more engaged with friends and family. A/P: ***     #Restless leg syndrome S: Patient on Neupro 3 mg.  Ferritin levels have been low in the past A/P: ***     #Hyperlipidemia/peripheral vascular disease/renal artery stenosis S: Compliant with atorvastatin 40 mg and also on Plavix/asa per Dr. April 2021.  She has known atherosclerotic peripheral vascular disease with intermittent claudication-stable with claudication at approximately 200 feet.    Patient with right renal artery stenosis on angiogram June 2016.  She is on Plavix and statin. A/P: ***     #Insulin resistance/hyperglycemia S: Patient is compliant with metformin 500 milligrams daily A/P: ***     #COPD S: Finds albuterol helpful starting 2020.  She had improvement in  shortness of breath when she quit smoking.   A/P: ***     No problem-specific Assessment & Plan notes found for this encounter.   Recommended follow up: ***No follow-ups on file. Future Appointments  Date Time Provider Department Center  04/26/2020  9:30 AM MC-CT 3 MC-CT [facility]  04/26/2020 10:00 AM MC-IR 1 MC-IR [facility]  05/04/2020 11:00 AM 07/04/2020, MD [facility]-HPC PEC  05/12/2020 10:00 AM MC-CV NL VASC 4 MC-SECVI [facility]  05/12/2020 11:00 AM MC-CV NL VASC 4 MC-SECVI [facility]  12/27/2020 12:45 PM 12/29/2020, MD TRE-TRE None    Lab/Order associations: No diagnosis found.  No orders of the defined types were placed in this encounter.   Time Spent: *** minutes of total time (4:48 PM***- 4:48 PM***) was spent on the date of the encounter performing the following actions: chart review prior to seeing the patient, obtaining history, performing a medically necessary exam, counseling on the treatment plan, placing orders, and documenting in our EHR.   Return precautions advised.  Sherrie George, CMA

## 2020-04-26 ENCOUNTER — Other Ambulatory Visit: Payer: Self-pay

## 2020-04-26 ENCOUNTER — Ambulatory Visit (HOSPITAL_COMMUNITY)
Admission: RE | Admit: 2020-04-26 | Discharge: 2020-04-26 | Disposition: A | Discharge status: Home / Self Care | Payer: Medicare Other | Source: Ambulatory Visit | Attending: General Surgery | Admitting: General Surgery

## 2020-04-26 DIAGNOSIS — I480 Paroxysmal atrial fibrillation: Secondary | ICD-10-CM | POA: Diagnosis not present

## 2020-04-26 DIAGNOSIS — I10 Essential (primary) hypertension: Secondary | ICD-10-CM | POA: Diagnosis not present

## 2020-04-26 DIAGNOSIS — K8001 Calculus of gallbladder with acute cholecystitis with obstruction: Secondary | ICD-10-CM

## 2020-04-26 DIAGNOSIS — T8143XA Infection following a procedure, organ and space surgical site, initial encounter: Secondary | ICD-10-CM

## 2020-04-26 DIAGNOSIS — R339 Retention of urine, unspecified: Secondary | ICD-10-CM | POA: Diagnosis not present

## 2020-04-26 DIAGNOSIS — K8000 Calculus of gallbladder with acute cholecystitis without obstruction: Secondary | ICD-10-CM

## 2020-04-26 DIAGNOSIS — E782 Mixed hyperlipidemia: Secondary | ICD-10-CM | POA: Diagnosis not present

## 2020-04-26 DIAGNOSIS — I5032 Chronic diastolic (congestive) heart failure: Secondary | ICD-10-CM | POA: Diagnosis not present

## 2020-04-26 MED ORDER — IOHEXOL 300 MG/ML  SOLN
100.0000 mL | Freq: Once | INTRAMUSCULAR | Status: AC | PRN
  Administered 2020-04-26: 100 mL via INTRAVENOUS

## 2020-04-26 NOTE — Progress Notes (Signed)
Referring Physician(s): Thompson,Burke  Chief Complaint: The patient is seen in follow up today s/p cholecystectomy, perihepatic fluid collection  History of present illness:  Cheryl Atkinson is a 86 y.o. female with medical history significant of persistent A. fib, chronic diastolic CHF, COPD, GERD, hypertension, hyperlipidemia, osteoporosis, PVD, RLS, thyroid disease, arthritis, chronic lower back pain, and acute cholecystitis who underwent drain placement 12/30/19 until she was able to undergo cholecystectomy 04/04/20.  She initially did well s/p removal, however returned to the ED 5/6 with lethargy and confusion.  She was found to have a perihepatic fluid collection s/p drain placement 04/12/20. She returns to [facility] today for CT imaging IR evaluation for management of her current drain.   Patient reports she has been doing well at [facility]  Her drain has been putting out small amounts of fluid; she estimates fluid output of 20-30 mL daily. She has been eating and drinking per her usual.  Denies fever, chills, abdominal pain, nausea, vomiting, dysuria.   Past Medical History:  Diagnosis Date  . Acute cholecystitis 12/29/2019  . Anemia   . Arthritis    "shoulders" (05/04/2015)  . Cellulitis of right lower extremity 11/27/2013  . CHF (congestive heart failure) (HCC)   . Chronic lower back pain   . Constipation   . COPD (chronic obstructive pulmonary disease) (HCC)   . GERD (gastroesophageal reflux disease)   . History of hiatal hernia   . HLD (hyperlipidemia)   . Hypertension   . Insomnia   . Osteoporosis   . Peripheral arterial disease (H[facility])   . Restless leg syndrome   . Thyroid disease     Past Surgical History:  Procedure Laterality Date  . ABDOMINAL AORTAGRAM  05/04/2015   Procedure: Abdominal Aortagram;  Surgeon: Runell Gess, MD;  Location: [facility] INVASIVE CV LAB;  Service: Cardiovascular;;  . APPENDECTOMY    . CATARACT EXTRACTION W/ INTRAOCULAR LENS  IMPLANT, BILATERAL  Bilateral   . CHOLECYSTECTOMY N/A 04/04/2020   Procedure: LAPAROSCOPIC CHOLECYSTECTOMY;  Surgeon: Violeta Gelinas, MD;  Location: [facility] OR;  Service: General;  Laterality: N/A;  . I & D EXTREMITY Right 12/22/2016   Procedure: IRRIGATION AND DEBRIDEMENT EXTREMITY;  Surgeon: Mack Hook, MD;  Location: [facility] OR;  Service: Orthopedics;  Laterality: Right;  . IR EXCHANGE BILIARY DRAIN  03/13/2020  . IR PATIENT EVAL TECH 0-60 MINS  12/30/2019  . IR PERC CHOLECYSTOSTOMY  01/27/2020  . PERIPHERAL VASCULAR CATHETERIZATION N/A 05/04/2015   Procedure: Lower Extremity Angiography;  Surgeon: Runell Gess, MD;  Location: [facility] INVASIVE CV LAB;  Service: Cardiovascular;  Laterality: N/A;  . PERIPHERAL VASCULAR CATHETERIZATION  05/04/2015   Procedure: Peripheral Vascular Intervention;  Surgeon: Runell Gess, MD;  Location: [facility] INVASIVE CV LAB;  Service: Cardiovascular;;  RCIA - 7x22 ICAST  . PERIPHERAL VASCULAR CATHETERIZATION Right 09/04/2015   Procedure: Peripheral Vascular Atherectomy;  Surgeon: Runell Gess, MD;  Location: [facility] INVASIVE CV LAB;  Service: Cardiovascular;  Laterality: Right;  SFA  . sfa Right 09/04/2015   de balloon  . THYROID SURGERY Right ?2013   "had goiter taken off my neck"  . TONSILLECTOMY    . VAGINAL HYSTERECTOMY      Allergies: Codeine  Medications: Prior to Admission medications   Medication Sig Start Date End Date Taking? Authorizing Provider  Albuterol Sulfate (PROAIR RESPICLICK) 108 (90 Base) MCG/ACT AEPB Inhale 2 puffs into the lungs every 6 (six) hours as needed (shortness of breath from COPD). 12/29/18   Tana Conch  O, MD  apixaban (ELIQUIS) 5 MG TABS tablet Take 1 tablet (5 mg total) by mouth 2 (two) times daily. Start 04/06/20 04/05/20   Georganna Skeans, MD  atorvastatin (LIPITOR) 40 MG tablet Take 1 tablet (40 mg total) by mouth daily. 03/16/20   Marin Olp, MD  bisacodyl (DULCOLAX) 10 MG suppository Place 1 suppository (10 mg total) rectally daily as needed for  moderate constipation. 04/17/20   Hollice Gong, Mir Earlie Server, MD  clopidogrel (PLAVIX) 75 MG tablet Take 1 tablet (75 mg total) by mouth daily. Start 04/06/20 04/05/20   Georganna Skeans, MD  furosemide (LASIX) 40 MG tablet Take 1 tablet (40 mg total) by mouth every other day. 04/06/20   Marin Olp, MD  irbesartan (AVAPRO) 300 MG tablet Take 1 tablet (300 mg total) by mouth daily. 03/25/20   Marin Olp, MD  linezolid (ZYVOX) 600 MG tablet Take 1 tablet (600 mg total) by mouth every 12 (twelve) hours for 11 days. 04/17/20 04/28/20  Hollice Gong, Mir Earlie Server, MD  Multiple Vitamins-Minerals (PRESERVISION AREDS 2 PO) Take 1 tablet by mouth 2 (two) times daily.    [provider]  NEUPRO 3 MG/24HR PT24 PLACE 1 PATCH (3 MG) ONTO THE SKIN AT BEDTIME Patient taking differently: Apply 1 patch topically at bedtime. Take off in the morning 03/29/20   Marin Olp, MD  polyethylene glycol (MIRALAX / GLYCOLAX) 17 g packet Take 17 g by mouth 2 (two) times daily. Reported on 03/14/2016 Patient taking differently: Take 17 g by mouth daily as needed for mild constipation.  01/03/20   Hongalgi, Lenis Dickinson, MD  potassium chloride SA (KLOR-CON) 20 MEQ tablet Take 1 tablet (20 mEq total) by mouth daily as needed (take on days that you take lasix). Patient taking differently: Take 20 mEq by mouth every other day.  03/25/20   Marin Olp, MD  sodium chloride flush (NS) 0.9 % SOLN 5 mLs by Intracatheter route every 8 (eight) hours. Patient not taking: Reported on 04/06/2020 01/03/20   Norm Parcel, PA-C  traZODone (DESYREL) 50 MG tablet Take 1.5 tablets (75 mg total) by mouth at bedtime as needed for sleep. 04/17/20   Tomma Rakers, MD     Family History  Problem Relation Age of Onset  . Heart disease Father   . Heart attack Father   . Heart disease Mother   . Coronary artery disease Other   . Atrial fibrillation Sister   . Stroke Maternal Grandmother   . Cancer Paternal Grandmother      Social History   Socioeconomic History  . Marital status: Widowed    Spouse name: Not on file  . Number of children: Not on file  . Years of education: Not on file  . Highest education level: Not on file  Occupational History  . Not on file  Tobacco Use  . Smoking status: Former Smoker    Packs/day: 0.50    Years: 60.00    Pack years: 30.00    Types: Cigarettes    Quit date: 04/02/2015    Years since quitting: 5.0  . Smokeless tobacco: Never Used  Substance and Sexual Activity  . Alcohol use: No  . Drug use: No  . Sexual activity: Not Currently  Other Topics Concern  . Not on file  Social History Narrative   Widowed 2013. 2 sons. 1 grandchild.    Son can help on weekends. Sister and sister in law could help as well. Thinks she may stop  driving in next few years.       Retired from [employer].       Hobbies: time at home and with family      HCPOA: sister-in-law and brother. Deirdre Evener.       DNR/DNI      Regular exercise: none   Caffeine use: cup of coffee    Social Determinants of Health   Financial Resource Strain:   . Difficulty of Paying Living Expenses:   Food Insecurity:   . Worried About Programme researcher, broadcasting/film/video in the Last Year:   . Barista in the Last Year:   Transportation Needs:   . Freight forwarder (Medical):   Marland Kitchen Lack of Transportation (Non-Medical):   Physical Activity:   . Days of Exercise per Week:   . Minutes of Exercise per Session:   Stress:   . Feeling of Stress :   Social Connections:   . Frequency of Communication with Friends and Family:   . Frequency of Social Gatherings with Friends and Family:   . Attends Religious Services:   . Active Member of Clubs or Organizations:   . Attends Banker Meetings:   Marland Kitchen Marital Status:      Vital Signs: LMP  (LMP Unknown)   Physical Exam Vitals and nursing note reviewed.   Abdomen: soft, RUQ drain in place. 25 mL output, orange-colored.  Insertion site  intact without erythema and warmth  Imaging: No results found.  Labs:  CBC: Recent Labs    04/14/20 0509 04/15/20 0507 04/16/20 0530 04/17/20 0344  WBC 12.4* 12.4* 11.7* 12.1*  HGB 8.4* 8.5* 8.6* 8.4*  HCT 26.8* 26.5* 27.2* 26.6*  PLT 265 365 397 461*    COAGS: Recent Labs    01/25/20 0100 01/25/20 0100 04/04/20 0847 04/07/20 1300 04/07/20 2301 04/08/20 0520 04/08/20 1701 04/09/20 0316 04/12/20 0732  INR 1.4*  --  1.1 1.3*  --   --   --   --  1.8*  APTT 28   < >  --  41*   < > 54* 65* 89* 43*   < > = values in this interval not displayed.    BMP: Recent Labs    04/14/20 0509 04/15/20 0507 04/16/20 0930 04/17/20 0344  NA 137 137 137 137  K 2.7* 3.4* 3.5 3.7  CL 105 104 101 102  CO2 22 24 26 26   GLUCOSE 111* 108* 170* 105*  BUN 22 20 18 20   CALCIUM 7.3* 7.7* 8.1* 7.8*  CREATININE 0.89 0.68 0.77 0.80  GFRNONAA 59* >60 >60 >60  GFRAA >60 >60 >60 >60    LIVER FUNCTION TESTS: Recent Labs    03/15/20 1038 04/04/20 0847 04/06/20 1831 04/09/20 0316  BILITOT 0.7 0.5 0.9 1.1  AST 13 16 27 21   ALT 9 14 20 19   ALKPHOS 99 87 93 85  PROT 7.1 7.4 7.1 6.3*  ALBUMIN 4.2 3.8 3.4* 2.8*    Assessment: Acute cholecystitis, s/p cholecystectomy 5/4 Perihepatic fluid collection aspiration and drainage 5/12 Patient presents to IR drain clinic today for assessment of her drain.  She reports moderate output daily.  She reports her drain is not currently being flushed.  Site intact, clean, and dry.  CT Abdomen Pelvis obtained and reviewed by Dr. who notes an improved but persistent fluid collection with possible communication to the gall bladder fossa.  Drain to remain in place today.  Patient instructed to flush daily and continue routine drain  care. Instructions were documented in her facility paper work.  She will be scheduled for repeat CT Abdomen Pelvis in 2 weeks.  She verbalizes understanding.   Signed: Hoyt Koch, PA 04/26/2020,  11:20 AM   Please refer to Dr. Fredia Sorrow attestation of this note for management and plan.

## 2020-04-27 ENCOUNTER — Other Ambulatory Visit: Payer: Self-pay | Admitting: *Deleted

## 2020-04-27 DIAGNOSIS — K219 Gastro-esophageal reflux disease without esophagitis: Secondary | ICD-10-CM | POA: Diagnosis not present

## 2020-04-27 DIAGNOSIS — L89621 Pressure ulcer of left heel, stage 1: Secondary | ICD-10-CM | POA: Diagnosis not present

## 2020-04-27 DIAGNOSIS — I5032 Chronic diastolic (congestive) heart failure: Secondary | ICD-10-CM | POA: Diagnosis not present

## 2020-04-27 DIAGNOSIS — I10 Essential (primary) hypertension: Secondary | ICD-10-CM | POA: Diagnosis not present

## 2020-04-27 DIAGNOSIS — I480 Paroxysmal atrial fibrillation: Secondary | ICD-10-CM | POA: Diagnosis not present

## 2020-04-27 NOTE — Patient Outreach (Signed)
Member screened for potential [facility] Care Management needs as a benefit of [employer].  Member is receiving skilled therapy at [facility]. Facility previously reported during telephonic IDT meeting that member will have JP drain removed. Also reported member is from home and has caregiver assistance while son works during the day.   Chart reviewed. Noted Palliative Medicine Team MD inpatient notes. Recommendation for palliative follow up while in SNF. However, not ordered on dc summary. Communication sent to facility SW to request palliative care referral while in SNF as appropriate. Also confirmed with [facility] that they remain active.   Will continue to follow while member resides at [facility]. Will plan outreach to [employer] son Tammy Sours to discuss transition plans.    Raiford Noble, MSN-Ed, RN,BSN [facility] Post Acute Care Coordinator [phone] [facility]) [phone]  (Toll free office)

## 2020-04-28 ENCOUNTER — Other Ambulatory Visit: Payer: Self-pay | Admitting: General Surgery

## 2020-04-28 DIAGNOSIS — E782 Mixed hyperlipidemia: Secondary | ICD-10-CM | POA: Diagnosis not present

## 2020-04-28 DIAGNOSIS — J449 Chronic obstructive pulmonary disease, unspecified: Secondary | ICD-10-CM | POA: Diagnosis not present

## 2020-04-28 DIAGNOSIS — L89621 Pressure ulcer of left heel, stage 1: Secondary | ICD-10-CM | POA: Diagnosis not present

## 2020-04-28 DIAGNOSIS — K8000 Calculus of gallbladder with acute cholecystitis without obstruction: Secondary | ICD-10-CM

## 2020-04-28 DIAGNOSIS — K219 Gastro-esophageal reflux disease without esophagitis: Secondary | ICD-10-CM | POA: Diagnosis not present

## 2020-04-28 DIAGNOSIS — I5032 Chronic diastolic (congestive) heart failure: Secondary | ICD-10-CM | POA: Diagnosis not present

## 2020-04-28 DIAGNOSIS — I10 Essential (primary) hypertension: Secondary | ICD-10-CM | POA: Diagnosis not present

## 2020-04-28 DIAGNOSIS — I480 Paroxysmal atrial fibrillation: Secondary | ICD-10-CM | POA: Diagnosis not present

## 2020-04-28 DIAGNOSIS — Z9049 Acquired absence of other specified parts of digestive tract: Secondary | ICD-10-CM | POA: Diagnosis not present

## 2020-05-03 DIAGNOSIS — L89621 Pressure ulcer of left heel, stage 1: Secondary | ICD-10-CM | POA: Diagnosis not present

## 2020-05-03 DIAGNOSIS — I10 Essential (primary) hypertension: Secondary | ICD-10-CM | POA: Diagnosis not present

## 2020-05-03 DIAGNOSIS — K219 Gastro-esophageal reflux disease without esophagitis: Secondary | ICD-10-CM | POA: Diagnosis not present

## 2020-05-03 DIAGNOSIS — J449 Chronic obstructive pulmonary disease, unspecified: Secondary | ICD-10-CM | POA: Diagnosis not present

## 2020-05-03 DIAGNOSIS — E782 Mixed hyperlipidemia: Secondary | ICD-10-CM | POA: Diagnosis not present

## 2020-05-03 DIAGNOSIS — I5032 Chronic diastolic (congestive) heart failure: Secondary | ICD-10-CM | POA: Diagnosis not present

## 2020-05-03 DIAGNOSIS — I480 Paroxysmal atrial fibrillation: Secondary | ICD-10-CM | POA: Diagnosis not present

## 2020-05-03 DIAGNOSIS — Z9049 Acquired absence of other specified parts of digestive tract: Secondary | ICD-10-CM | POA: Diagnosis not present

## 2020-05-04 ENCOUNTER — Ambulatory Visit: Payer: Medicare Other | Admitting: Family Medicine

## 2020-05-06 DIAGNOSIS — Z03818 Encounter for observation for suspected exposure to other biological agents ruled out: Secondary | ICD-10-CM | POA: Diagnosis not present

## 2020-05-09 ENCOUNTER — Other Ambulatory Visit (HOSPITAL_COMMUNITY): Payer: Self-pay | Admitting: Cardiovascular Disease

## 2020-05-09 DIAGNOSIS — I739 Peripheral vascular disease, unspecified: Secondary | ICD-10-CM

## 2020-05-10 ENCOUNTER — Other Ambulatory Visit: Payer: Self-pay | Admitting: *Deleted

## 2020-05-10 DIAGNOSIS — Z20828 Contact with and (suspected) exposure to other viral communicable diseases: Secondary | ICD-10-CM | POA: Diagnosis not present

## 2020-05-10 NOTE — Patient Outreach (Signed)
Screened for potential [facility] Care Management needs as a benefit of  [employer].  Mrs. Mayers is receiving skilled therapy at [facility].   Writer attended telephonic interdisciplinary team meeting to assess for disposition needs and transition plan for resident.   Facility reports member will likely transition to home soon. Will have appointment for JP drain follow up. Lives with son. Facility has not made palliative referral.  Telephone call made to son/DPR Marabeth Melland [phone] and [phone] to discuss transition plans. No answer. HIPAA compliant voicemail messages left to request return call.   Will continue to follow while member resides in SNF.    Raiford Noble, MSN-Ed, RN,BSN [facility] Post Acute Care Coordinator [phone] [facility]) [phone]  (Toll free office)

## 2020-05-11 ENCOUNTER — Other Ambulatory Visit: Payer: TRICARE For Life (TFL)

## 2020-05-11 ENCOUNTER — Inpatient Hospital Stay: Admission: RE | Admit: 2020-05-11 | Payer: TRICARE For Life (TFL) | Source: Ambulatory Visit

## 2020-05-12 ENCOUNTER — Ambulatory Visit (HOSPITAL_COMMUNITY)
Admission: RE | Admit: 2020-05-12 | Payer: Medicare Other | Source: Ambulatory Visit | Attending: Cardiovascular Disease | Admitting: Cardiovascular Disease

## 2020-05-12 ENCOUNTER — Ambulatory Visit (HOSPITAL_COMMUNITY): Admission: RE | Admit: 2020-05-12 | Payer: Medicare Other | Source: Ambulatory Visit

## 2020-05-12 ENCOUNTER — Telehealth: Payer: Self-pay | Admitting: Family Medicine

## 2020-05-12 DIAGNOSIS — I5032 Chronic diastolic (congestive) heart failure: Secondary | ICD-10-CM | POA: Diagnosis not present

## 2020-05-12 DIAGNOSIS — G4700 Insomnia, unspecified: Secondary | ICD-10-CM | POA: Diagnosis not present

## 2020-05-12 DIAGNOSIS — G2581 Restless legs syndrome: Secondary | ICD-10-CM | POA: Diagnosis not present

## 2020-05-12 DIAGNOSIS — R2681 Unsteadiness on feet: Secondary | ICD-10-CM | POA: Diagnosis not present

## 2020-05-12 DIAGNOSIS — E8881 Metabolic syndrome: Secondary | ICD-10-CM | POA: Diagnosis not present

## 2020-05-12 DIAGNOSIS — I110 Hypertensive heart disease with heart failure: Secondary | ICD-10-CM | POA: Diagnosis not present

## 2020-05-12 DIAGNOSIS — M6281 Muscle weakness (generalized): Secondary | ICD-10-CM | POA: Diagnosis not present

## 2020-05-12 DIAGNOSIS — J449 Chronic obstructive pulmonary disease, unspecified: Secondary | ICD-10-CM | POA: Diagnosis not present

## 2020-05-12 DIAGNOSIS — Z7901 Long term (current) use of anticoagulants: Secondary | ICD-10-CM | POA: Diagnosis not present

## 2020-05-12 DIAGNOSIS — K9189 Other postprocedural complications and disorders of digestive system: Secondary | ICD-10-CM | POA: Diagnosis not present

## 2020-05-12 DIAGNOSIS — L89621 Pressure ulcer of left heel, stage 1: Secondary | ICD-10-CM | POA: Diagnosis not present

## 2020-05-12 DIAGNOSIS — Z4803 Encounter for change or removal of drains: Secondary | ICD-10-CM | POA: Diagnosis not present

## 2020-05-12 DIAGNOSIS — F0390 Unspecified dementia without behavioral disturbance: Secondary | ICD-10-CM | POA: Diagnosis not present

## 2020-05-12 DIAGNOSIS — K651 Peritoneal abscess: Secondary | ICD-10-CM | POA: Diagnosis not present

## 2020-05-12 DIAGNOSIS — I480 Paroxysmal atrial fibrillation: Secondary | ICD-10-CM | POA: Diagnosis not present

## 2020-05-12 NOTE — Telephone Encounter (Signed)
Called and lm on will with VO.

## 2020-05-12 NOTE — Telephone Encounter (Signed)
Will is an occupational therapist from [facility] asking for verbal orders frequency is-1 week 1, 2 week 3

## 2020-05-15 ENCOUNTER — Other Ambulatory Visit: Payer: Self-pay | Admitting: *Deleted

## 2020-05-15 ENCOUNTER — Telehealth: Payer: Self-pay | Admitting: Family Medicine

## 2020-05-15 DIAGNOSIS — K9189 Other postprocedural complications and disorders of digestive system: Secondary | ICD-10-CM | POA: Diagnosis not present

## 2020-05-15 DIAGNOSIS — I5032 Chronic diastolic (congestive) heart failure: Secondary | ICD-10-CM | POA: Diagnosis not present

## 2020-05-15 DIAGNOSIS — K651 Peritoneal abscess: Secondary | ICD-10-CM | POA: Diagnosis not present

## 2020-05-15 DIAGNOSIS — F0390 Unspecified dementia without behavioral disturbance: Secondary | ICD-10-CM | POA: Diagnosis not present

## 2020-05-15 DIAGNOSIS — Z4803 Encounter for change or removal of drains: Secondary | ICD-10-CM | POA: Diagnosis not present

## 2020-05-15 DIAGNOSIS — I110 Hypertensive heart disease with heart failure: Secondary | ICD-10-CM | POA: Diagnosis not present

## 2020-05-15 NOTE — Telephone Encounter (Signed)
Returned call and provided VO. 

## 2020-05-15 NOTE — Patient Outreach (Signed)
[facility] Post-Acute Care Coordinator follow up  Verified in Patient Cheryl Atkinson that [name] transitioned to home on Wednesday, June 10th from [facility]. She was active with [facility] prior and has [facility] home health.  Telephone call made to [name]. Patient identifiers confirmed. She states therapy is at the home currently. She was unable to talk at the time.  Notification sent to [facility] to verify they are aware of member's transition to home.  Referral sent to [facility] palliative. Per inpatient hospital PMT MD notes, outpatient palliative was recommended. Referral sent to [facility] palliative.     Raiford Noble, MSN-Ed, RN,BSN [facility] Post Acute Care Coordinator [phone] [facility]) [phone]  (Toll free office)

## 2020-05-15 NOTE — Telephone Encounter (Signed)
Requesting verbal OK to see patient for Home health physical Therapy for 2 week 3 and 1 week 1.    Can leave VM.

## 2020-05-16 ENCOUNTER — Ambulatory Visit
Admission: RE | Admit: 2020-05-16 | Discharge: 2020-05-16 | Disposition: A | Discharge status: Home / Self Care | Payer: Medicare Other | Source: Ambulatory Visit | Attending: Student | Admitting: Student

## 2020-05-16 ENCOUNTER — Encounter: Payer: Self-pay | Admitting: *Deleted

## 2020-05-16 ENCOUNTER — Ambulatory Visit
Admission: RE | Admit: 2020-05-16 | Discharge: 2020-05-16 | Disposition: A | Discharge status: Home / Self Care | Payer: Medicare Other | Source: Ambulatory Visit | Attending: General Surgery | Admitting: General Surgery

## 2020-05-16 DIAGNOSIS — K8000 Calculus of gallbladder with acute cholecystitis without obstruction: Secondary | ICD-10-CM

## 2020-05-16 HISTORY — PX: IR RADIOLOGIST EVAL & MGMT: IMG5224

## 2020-05-16 MED ORDER — IOPAMIDOL (ISOVUE-300) INJECTION 61%
100.0000 mL | Freq: Once | INTRAVENOUS | Status: AC | PRN
  Administered 2020-05-16: 100 mL via INTRAVENOUS

## 2020-05-16 NOTE — Progress Notes (Addendum)
Chief Complaint: The patient is seen in follow up today s/p cholecystectomy, perihepatic fluid collection  Referring Physician(s): Dr Cheryl Atkinson  History of present illness:  [name] a 86 y.o.female presenting today as a scheduled follow up, SP imaging guided drainage of post-operative fluid collection.   She is here today with her family for our interview.   Past medical history includes: persistent A. fib, chronic diastolic CHF, COPD, GERD, hypertension, hyperlipidemia, osteoporosis, PVD, RLS, thyroid disease, arthritis, chronic lower back pain, and acute cholecystitis.    She underwent drain placement for acute cholecystitis 12/30/19 until she was able to undergo cholecystectomy 04/04/20.  She initially did well s/p removal, however returned to the ED 5/6 with lethargy and confusion.  She was found to have a perihepatic fluid collection s/p drain placement 04/12/20.    She was last seen 5/26, with persisting fluid at the site of the perihepatic drain.   She tells me today that several days ago there has been precipitous drop in the output from the drain.  Essentially no output. The drain is cared for at her SNF.  She is no longer taking ABX. She denies fever. The drain is not being flushed.   Her next appointment with surgery is tomorrow with Dr. Janee Atkinson.   The CT performed today shows that the drain has been withdrawn, and is on the skin under the bandage.  There is residual fluid at the perihepatic fluid collection site.   She continues to deny feeling sick at all, and tells me she feels quite good.  Her appetite is good and she has no abd pain. .   Past Medical History:  Diagnosis Date  . Acute cholecystitis 12/29/2019  . Anemia   . Arthritis    "shoulders" (05/04/2015)  . Cellulitis of right lower extremity 11/27/2013  . CHF (congestive heart failure) (HCC)   . Chronic lower back pain   . Constipation   . COPD (chronic obstructive pulmonary disease)  (HCC)   . GERD (gastroesophageal reflux disease)   . History of hiatal hernia   . HLD (hyperlipidemia)   . Hypertension   . Insomnia   . Osteoporosis   . Peripheral arterial disease (HCC)   . Restless leg syndrome   . Thyroid disease     Past Surgical History:  Procedure Laterality Date  . ABDOMINAL AORTAGRAM  05/04/2015   Procedure: Abdominal Aortagram;  Surgeon: Runell Gess, MD;  Location: [facility] INVASIVE CV LAB;  Service: Cardiovascular;;  . APPENDECTOMY    . CATARACT EXTRACTION W/ INTRAOCULAR LENS  IMPLANT, BILATERAL Bilateral   . CHOLECYSTECTOMY N/A 04/04/2020   Procedure: LAPAROSCOPIC CHOLECYSTECTOMY;  Surgeon: Cheryl Gelinas, MD;  Location: [facility] OR;  Service: General;  Laterality: N/A;  . I & D EXTREMITY Right 12/22/2016   Procedure: IRRIGATION AND DEBRIDEMENT EXTREMITY;  Surgeon: Mack Hook, MD;  Location: [facility] OR;  Service: Orthopedics;  Laterality: Right;  . IR EXCHANGE BILIARY DRAIN  03/13/2020  . IR PATIENT EVAL TECH 0-60 MINS  12/30/2019  . IR PERC CHOLECYSTOSTOMY  01/27/2020  . IR RADIOLOGIST EVAL & MGMT  05/16/2020  . PERIPHERAL VASCULAR CATHETERIZATION N/A 05/04/2015   Procedure: Lower Extremity Angiography;  Surgeon: Runell Gess, MD;  Location: [facility] INVASIVE CV LAB;  Service: Cardiovascular;  Laterality: N/A;  . PERIPHERAL VASCULAR CATHETERIZATION  05/04/2015   Procedure: Peripheral Vascular Intervention;  Surgeon: Runell Gess, MD;  Location: [facility] INVASIVE CV LAB;  Service: Cardiovascular;;  RCIA - [id number] ICAST  .  PERIPHERAL VASCULAR CATHETERIZATION Right 09/04/2015   Procedure: Peripheral Vascular Atherectomy;  Surgeon: Runell GessJonathan J Berry, MD;  [facility]AS[facility] CV LAB;  Service: Cardiovascular;  Laterality: Right;  SFA  . sfa Right 09/04/2015   de balloon  . THYROID SURGERY Right ?2013   "had goiter taken off my neck"  . TONSILLECTOMY    . VAGINAL HYSTERECTOMY      Allergies: Codeine  Medications: Prior to Admission medications   Medication Sig Start Date End  Date Taking? Authorizing Provider  Albuterol Sulfate (PROAIR RESPICLICK) 108 (90 Base) MCG/ACT AEPB Inhale 2 puffs into the lungs every 6 (six) hours as needed (shortness of breath from COPD). 12/29/18   Shelva MajesticHunter, Stephen O, MD  apixaban (ELIQUIS) 5 MG TABS tablet Take 1 tablet (5 mg total) by mouth 2 (two) times daily. Start 04/06/20 04/05/20   Cheryl Gelinashompson, Burke, MD  atorvastatin (LIPITOR) 40 MG tablet Take 1 tablet (40 mg total) by mouth daily. 03/16/20   Shelva MajesticHunter, Stephen O, MD  bisacodyl (DULCOLAX) 10 MG suppository Place 1 suppository (10 mg total) rectally daily as needed for moderate constipation. 04/17/20   Kirby CriglerIkramullah, Mir Shirline FreesMohammed, MD  clopidogrel (PLAVIX) 75 MG tablet Take 1 tablet (75 mg total) by mouth daily. Start 04/06/20 04/05/20   Cheryl Gelinashompson, Burke, MD  furosemide (LASIX) 40 MG tablet Take 1 tablet (40 mg total) by mouth every other day. 04/06/20   Shelva MajesticHunter, Stephen O, MD  irbesartan (AVAPRO) 300 MG tablet Take 1 tablet (300 mg total) by mouth daily. 03/25/20   Shelva MajesticHunter, Stephen O, MD  Multiple Vitamins-Minerals (PRESERVISION AREDS 2 PO) Take 1 tablet by mouth 2 (two) times daily.    [provider]  NEUPRO 3 MG/24HR PT24 PLACE 1 PATCH (3 MG) ONTO THE SKIN AT BEDTIME Patient taking differently: Apply 1 patch topically at bedtime. Take off in the morning 03/29/20   Shelva MajesticHunter, Stephen O, MD  polyethylene glycol (MIRALAX / GLYCOLAX) 17 g packet Take 17 g by mouth 2 (two) times daily. Reported on 03/14/2016 Patient taking differently: Take 17 g by mouth daily as needed for mild constipation.  01/03/20   Hongalgi, Maximino GreenlandAnand D, MD  potassium chloride SA (KLOR-CON) 20 MEQ tablet Take 1 tablet (20 mEq total) by mouth daily as needed (take on days that you take lasix). Patient taking differently: Take 20 mEq by mouth every other day.  03/25/20   Shelva MajesticHunter, Stephen O, MD  sodium chloride flush (NS) 0.9 % SOLN 5 mLs by Intracatheter route every 8 (eight) hours. Patient not taking: Reported on 04/06/2020 01/03/20   Juliet RudeJohnson, Kelly  R, PA-C  traZODone (DESYREL) 50 MG tablet Take 1.5 tablets (75 mg total) by mouth at bedtime as needed for sleep. 04/17/20   Colon BranchIkramullah, Mir Mohammed, MD     Family History  Problem Relation Age of Onset  . Heart disease Father   . Heart attack Father   . Heart disease Mother   . Coronary artery disease Other   . Atrial fibrillation Sister   . Stroke Maternal Grandmother   . Cancer Paternal Grandmother     Social History   Socioeconomic History  . Marital status: Widowed    Spouse name: Not on file  . Number of children: Not on file  . Years of education: Not on file  . Highest education level: Not on file  Occupational History  . Not on file  Tobacco Use  . Smoking status: Former Smoker    Packs/day: 0.50    Years: 60.00  Pack years: 30.00    Types: Cigarettes    Quit date: 04/02/2015    Years since quitting: 5.1  . Smokeless tobacco: Never Used  Vaping Use  . Vaping Use: Never used  Substance and Sexual Activity  . Alcohol use: No  . Drug use: No  . Sexual activity: Not Currently  Other Topics Concern  . Not on file  Social History Narrative   Widowed 2013. 2 sons. 1 grandchild.    Son can help on weekends. Sister and sister in law could help as well. Thinks she may stop driving in next few years.       Retired from [employer].       Hobbies: time at home and with family      HCPOA: sister-in-law and brother. Margarite Gouge.       DNR/DNI      Regular exercise: none   Caffeine use: cup of coffee    Social Determinants of Health   Financial Resource Strain:   . Difficulty of Paying Living Expenses:   Food Insecurity:   . Worried About Charity fundraiser in the Last Year:   . Arboriculturist in the Last Year:   Transportation Needs:   . Film/video editor (Medical):   Marland Kitchen Lack of Transportation (Non-Medical):   Physical Activity:   . Days of Exercise per Week:   . Minutes of Exercise per Session:   Stress:   . Feeling of Stress :   Social  Connections:   . Frequency of Communication with Friends and Family:   . Frequency of Social Gatherings with Friends and Family:   . Attends Religious Services:   . Active Member of Clubs or Organizations:   . Attends Archivist Meetings:   Marland Kitchen Marital Status:        Review of Systems: A 12 point ROS discussed and pertinent positives are indicated in the HPI above.  All other systems are negative.  Review of Systems  Vital Signs: BP (!) 189/100   Pulse 84   Temp 98.5 F (36.9 C)   LMP  (LMP Unknown)   SpO2 96%   Physical Exam General: 86 yo female appearing stated age.  Well-developed, well-nourished.  No distress. HEENT: Atraumatic, normocephalic.  Conjugate gaze, extra-ocular motor intact. No scleral icterus or scleral injection. No lesions on external ears, nose, lips, or gums.  Oral mucosa moist, pink.  Neck: Symmetric with no goiter enlargement.  Chest/Lungs:  Symmetric chest with inspiration/expiration.  No labored breathing. Left lung clear.  Decreased sounds at the base of the right lung.  Heart:  RRR, No JVD appreciated.  Abdomen:  NT abd.  No peritoneal signs/rigidity   Genito-urinary: Deferred Neurologic: Alert & Oriented to person, place, and time.   Normal affect and insight.  Appropriate questions.       Mallampati Score:     Imaging: CT ABDOMEN PELVIS W CONTRAST  Result Date: 04/26/2020 CLINICAL DATA:  Status post cholecystectomy on 04/04/2020. Status post percutaneous catheter drainage of perihepatic fluid collection on 04/12/2020. EXAM: CT ABDOMEN AND PELVIS WITH CONTRAST TECHNIQUE: Multidetector CT imaging of the abdomen and pelvis was performed using the standard protocol following bolus administration of intravenous contrast. CONTRAST:  130mL OMNIPAQUE IOHEXOL 300 MG/ML  SOLN COMPARISON:  04/12/2020, 04/11/2020 and 04/08/2020 CT studies FINDINGS: Lower chest: Right basilar pleural fluid and trace left pleural fluid. Hepatobiliary: Stable appearance  of liver. No biliary ductal dilatation. Percutaneous drainage catheter remains in place  along the lateral aspect of the right lobe of the liver with reduction in size of the perihepatic fluid collection. There remains a lentiform shaped fluid collection remaining around the drainage catheter measuring up to approximately 1.4 cm in thickness and just over 8 cm in maximum length. This fluid collection contains some small locules of air. Similar fluid and small amount of air remains in the gallbladder fossa which appears to communicate with the lateral perihepatic collection containing the drain. This is best evident on the coronal reconstructions. Pancreas: Unremarkable. No pancreatic ductal dilatation or surrounding inflammatory changes. Spleen: Normal in size without focal abnormality. Adrenals/Urinary Tract: Adrenal glands are unremarkable. Kidneys are normal, without renal calculi, focal lesion, or hydronephrosis. Bladder is unremarkable. Stomach/Bowel: Bowel shows no evidence of obstruction, ileus or inflammation. No free air. Vascular/Lymphatic: No significant vascular findings are present. No enlarged abdominal or pelvic lymph nodes. Reproductive: Status post hysterectomy. No adnexal masses. Other: No abdominal wall hernia or abnormality. No abdominopelvic ascites. Musculoskeletal: No acute or significant osseous findings. IMPRESSION: Reduction in size of the perihepatic fluid collection after percutaneous catheter drainage. There remains a lentiform lateral collection that also communicates with a small amount of residual fluid and air in the gallbladder fossa. This will be correlated with tube output. Given appearance, the percutaneous drainage catheter will likely be left in place for at least another 1-2 weeks and a follow-up CT performed. Electronically Signed   By: Irish Lack M.D.   On: 04/26/2020 11:24   IR Radiologist Eval & Mgmt  Result Date: 05/16/2020 Please refer to notes tab for details  about interventional procedure. (Op Note)   Labs:  CBC: Recent Labs    04/14/20 0509 04/15/20 0507 04/16/20 0530 04/17/20 0344  WBC 12.4* 12.4* 11.7* 12.1*  HGB 8.4* 8.5* 8.6* 8.4*  HCT 26.8* 26.5* 27.2* 26.6*  PLT 265 365 397 461*    COAGS: Recent Labs    01/25/20 0100 01/25/20 0100 04/04/20 0847 04/07/20 1300 04/07/20 2301 04/08/20 0520 04/08/20 1701 04/09/20 0316 04/12/20 0732  INR 1.4*  --  1.1 1.3*  --   --   --   --  1.8*  APTT 28   < >  --  41*   < > 54* 65* 89* 43*   < > = values in this interval not displayed.    BMP: Recent Labs    04/14/20 0509 04/15/20 0507 04/16/20 0930 04/17/20 0344  NA 137 137 137 137  K 2.7* 3.4* 3.5 3.7  CL 105 104 101 102  CO2 22 24 26 26   GLUCOSE 111* 108* 170* 105*  BUN 22 20 18 20   CALCIUM 7.3* 7.7* 8.1* 7.8*  CREATININE 0.89 0.68 0.77 0.80  GFRNONAA 59* >60 >60 >60  GFRAA >60 >60 >60 >60    LIVER FUNCTION TESTS: Recent Labs    03/15/20 1038 04/04/20 0847 04/06/20 1831 04/09/20 0316  BILITOT 0.7 0.5 0.9 1.1  AST 13 16 27 21   ALT 9 14 20 19   ALKPHOS 99 87 93 85  PROT 7.1 7.4 7.1 6.3*  ALBUMIN 4.2 3.8 3.4* 2.8*    TUMOR MARKERS: No results for input(s): AFPTM, CEA, CA199, CHROMGRNA in the last 8760 hours.  Assessment and Plan:  Ms Risinger is 86 yo female with a post-cholecystectomy infection, SP drainage 04/12/2020.    She returns today for evaluation of her drain, to discover that the drain has been accidentally displaced.    CT shows a persisting fluid collection in the upper abdomen,  though she feels fine with no signs of infection.   She has an appointment with her surgeon tomorrow.    I have discussed possible options, which might include surveillance vs image guided drainage vs image guided aspiration.   I advised her to observe her surgical follow up, and we will reach out to Dr. Laurell Josephs to establish a plan.   She understands.   Plan: - The drain was accidentally displaced at her SNF,  unrecognized.  We released the retention suture today.  - I have encouraged her to follow up with surgery office, and we will establish a plan for either surveillance vs image guided aspiration/drainage.      Electronically Signed: Gilmer Mor 05/16/2020, 3:39 PM   I spent a total of    15 Minutes in face to face in clinical consultation, greater than 50% of which was counseling/coordinating care for post surgical infection drain management.

## 2020-05-17 DIAGNOSIS — I5032 Chronic diastolic (congestive) heart failure: Secondary | ICD-10-CM | POA: Diagnosis not present

## 2020-05-17 DIAGNOSIS — I110 Hypertensive heart disease with heart failure: Secondary | ICD-10-CM | POA: Diagnosis not present

## 2020-05-17 DIAGNOSIS — K9189 Other postprocedural complications and disorders of digestive system: Secondary | ICD-10-CM | POA: Diagnosis not present

## 2020-05-17 DIAGNOSIS — K651 Peritoneal abscess: Secondary | ICD-10-CM | POA: Diagnosis not present

## 2020-05-17 DIAGNOSIS — F0390 Unspecified dementia without behavioral disturbance: Secondary | ICD-10-CM | POA: Diagnosis not present

## 2020-05-17 DIAGNOSIS — Z4803 Encounter for change or removal of drains: Secondary | ICD-10-CM | POA: Diagnosis not present

## 2020-05-18 DIAGNOSIS — K9189 Other postprocedural complications and disorders of digestive system: Secondary | ICD-10-CM | POA: Diagnosis not present

## 2020-05-18 DIAGNOSIS — I110 Hypertensive heart disease with heart failure: Secondary | ICD-10-CM | POA: Diagnosis not present

## 2020-05-18 DIAGNOSIS — K651 Peritoneal abscess: Secondary | ICD-10-CM | POA: Diagnosis not present

## 2020-05-18 DIAGNOSIS — Z4803 Encounter for change or removal of drains: Secondary | ICD-10-CM | POA: Diagnosis not present

## 2020-05-18 DIAGNOSIS — F0390 Unspecified dementia without behavioral disturbance: Secondary | ICD-10-CM | POA: Diagnosis not present

## 2020-05-18 DIAGNOSIS — I5032 Chronic diastolic (congestive) heart failure: Secondary | ICD-10-CM | POA: Diagnosis not present

## 2020-05-22 ENCOUNTER — Telehealth: Payer: Self-pay | Admitting: *Deleted

## 2020-05-22 DIAGNOSIS — I5032 Chronic diastolic (congestive) heart failure: Secondary | ICD-10-CM | POA: Diagnosis not present

## 2020-05-22 DIAGNOSIS — K9189 Other postprocedural complications and disorders of digestive system: Secondary | ICD-10-CM | POA: Diagnosis not present

## 2020-05-22 DIAGNOSIS — F0390 Unspecified dementia without behavioral disturbance: Secondary | ICD-10-CM | POA: Diagnosis not present

## 2020-05-22 DIAGNOSIS — I110 Hypertensive heart disease with heart failure: Secondary | ICD-10-CM | POA: Diagnosis not present

## 2020-05-22 DIAGNOSIS — Z4803 Encounter for change or removal of drains: Secondary | ICD-10-CM | POA: Diagnosis not present

## 2020-05-22 DIAGNOSIS — K651 Peritoneal abscess: Secondary | ICD-10-CM | POA: Diagnosis not present

## 2020-05-22 NOTE — Telephone Encounter (Signed)
Pt aware. Appointment tomorrow with Dr Mardelle Matte

## 2020-05-22 NOTE — Telephone Encounter (Signed)
Pt PT  Cheryl Atkinson [phone] called today stating Patient  Cheryl Atkinson not feeling good, complaining of nausea. SOB last night used inhaler and helped.  Had diarrhea was drinking pedialitre and loose stool stopped. BP elevated 170/70 today at 08:00 taking medication as prescribed. Have not missed any dose. No Covid exposure  per patient. Pt has appointment Dr Mardelle Matte tomorrow. Advise if feeling worse go to ER or UC Maintain appointment tomorrow. Pt verbalized understanding.  Is anything patient can take for nausea and BP till appointment . Please advise

## 2020-05-22 NOTE — Telephone Encounter (Signed)
No new recommendations until after in office evaluation. thanks

## 2020-05-23 ENCOUNTER — Telehealth: Payer: Self-pay | Admitting: *Deleted

## 2020-05-23 ENCOUNTER — Emergency Department (HOSPITAL_COMMUNITY): Payer: Medicare Other

## 2020-05-23 ENCOUNTER — Encounter: Payer: Self-pay | Admitting: Physician Assistant

## 2020-05-23 ENCOUNTER — Other Ambulatory Visit: Payer: Self-pay

## 2020-05-23 ENCOUNTER — Ambulatory Visit: Payer: Medicare Other | Admitting: Family Medicine

## 2020-05-23 ENCOUNTER — Ambulatory Visit: Payer: Medicare Other | Admitting: Physician Assistant

## 2020-05-23 ENCOUNTER — Encounter (HOSPITAL_COMMUNITY): Payer: Self-pay | Admitting: Emergency Medicine

## 2020-05-23 ENCOUNTER — Inpatient Hospital Stay (HOSPITAL_COMMUNITY)
Admission: AD | Admit: 2020-05-23 | Discharge: 2020-05-26 | DRG: 291 | Disposition: A | Discharge status: Home Health | Payer: Medicare Other | Attending: Family Medicine | Admitting: Family Medicine

## 2020-05-23 DIAGNOSIS — Z7984 Long term (current) use of oral hypoglycemic drugs: Secondary | ICD-10-CM

## 2020-05-23 DIAGNOSIS — J9601 Acute respiratory failure with hypoxia: Secondary | ICD-10-CM | POA: Diagnosis not present

## 2020-05-23 DIAGNOSIS — I4891 Unspecified atrial fibrillation: Secondary | ICD-10-CM | POA: Diagnosis present

## 2020-05-23 DIAGNOSIS — Z7902 Long term (current) use of antithrombotics/antiplatelets: Secondary | ICD-10-CM

## 2020-05-23 DIAGNOSIS — Z8249 Family history of ischemic heart disease and other diseases of the circulatory system: Secondary | ICD-10-CM

## 2020-05-23 DIAGNOSIS — I5033 Acute on chronic diastolic (congestive) heart failure: Secondary | ICD-10-CM | POA: Diagnosis not present

## 2020-05-23 DIAGNOSIS — Z20822 Contact with and (suspected) exposure to covid-19: Secondary | ICD-10-CM | POA: Diagnosis present

## 2020-05-23 DIAGNOSIS — Z79899 Other long term (current) drug therapy: Secondary | ICD-10-CM

## 2020-05-23 DIAGNOSIS — K219 Gastro-esophageal reflux disease without esophagitis: Secondary | ICD-10-CM | POA: Diagnosis present

## 2020-05-23 DIAGNOSIS — I509 Heart failure, unspecified: Secondary | ICD-10-CM | POA: Diagnosis not present

## 2020-05-23 DIAGNOSIS — Z87891 Personal history of nicotine dependence: Secondary | ICD-10-CM

## 2020-05-23 DIAGNOSIS — Z87440 Personal history of urinary (tract) infections: Secondary | ICD-10-CM

## 2020-05-23 DIAGNOSIS — I110 Hypertensive heart disease with heart failure: Principal | ICD-10-CM | POA: Diagnosis present

## 2020-05-23 DIAGNOSIS — Z66 Do not resuscitate: Secondary | ICD-10-CM | POA: Diagnosis present

## 2020-05-23 DIAGNOSIS — J449 Chronic obstructive pulmonary disease, unspecified: Secondary | ICD-10-CM | POA: Diagnosis present

## 2020-05-23 DIAGNOSIS — E278 Other specified disorders of adrenal gland: Secondary | ICD-10-CM | POA: Diagnosis present

## 2020-05-23 DIAGNOSIS — S80812A Abrasion, left lower leg, initial encounter: Secondary | ICD-10-CM

## 2020-05-23 DIAGNOSIS — E1151 Type 2 diabetes mellitus with diabetic peripheral angiopathy without gangrene: Secondary | ICD-10-CM | POA: Diagnosis present

## 2020-05-23 DIAGNOSIS — Z823 Family history of stroke: Secondary | ICD-10-CM

## 2020-05-23 DIAGNOSIS — Z90710 Acquired absence of both cervix and uterus: Secondary | ICD-10-CM

## 2020-05-23 DIAGNOSIS — I480 Paroxysmal atrial fibrillation: Secondary | ICD-10-CM | POA: Diagnosis present

## 2020-05-23 DIAGNOSIS — Z7901 Long term (current) use of anticoagulants: Secondary | ICD-10-CM

## 2020-05-23 DIAGNOSIS — G2581 Restless legs syndrome: Secondary | ICD-10-CM | POA: Diagnosis present

## 2020-05-23 DIAGNOSIS — E785 Hyperlipidemia, unspecified: Secondary | ICD-10-CM | POA: Diagnosis present

## 2020-05-23 DIAGNOSIS — M810 Age-related osteoporosis without current pathological fracture: Secondary | ICD-10-CM | POA: Diagnosis present

## 2020-05-23 LAB — CBC WITH DIFFERENTIAL/PLATELET
Abs Immature Granulocytes: 0.05 10*3/uL (ref 0.00–0.07)
Basophils Absolute: 0 10*3/uL (ref 0.0–0.1)
Basophils Relative: 0 %
Eosinophils Absolute: 0 10*3/uL (ref 0.0–0.5)
Eosinophils Relative: 0 %
HCT: 28.7 % — ABNORMAL LOW (ref 36.0–46.0)
Hemoglobin: 8.5 g/dL — ABNORMAL LOW (ref 12.0–15.0)
Immature Granulocytes: 1 %
Lymphocytes Relative: 9 %
Lymphs Abs: 0.9 10*3/uL (ref 0.7–4.0)
MCH: 25.6 pg — ABNORMAL LOW (ref 26.0–34.0)
MCHC: 29.6 g/dL — ABNORMAL LOW (ref 30.0–36.0)
MCV: 86.4 fL (ref 80.0–100.0)
Monocytes Absolute: 0.6 10*3/uL (ref 0.1–1.0)
Monocytes Relative: 6 %
Neutro Abs: 8.5 10*3/uL — ABNORMAL HIGH (ref 1.7–7.7)
Neutrophils Relative %: 84 %
Platelets: 311 10*3/uL (ref 150–400)
RBC: 3.32 MIL/uL — ABNORMAL LOW (ref 3.87–5.11)
RDW: 17.1 % — ABNORMAL HIGH (ref 11.5–15.5)
WBC: 10.1 10*3/uL (ref 4.0–10.5)
nRBC: 0 % (ref 0.0–0.2)

## 2020-05-23 LAB — COMPREHENSIVE METABOLIC PANEL
ALT: 23 U/L (ref 0–44)
AST: 20 U/L (ref 15–41)
Albumin: 3.4 g/dL — ABNORMAL LOW (ref 3.5–5.0)
Alkaline Phosphatase: 110 U/L (ref 38–126)
Anion gap: 12 (ref 5–15)
BUN: 18 mg/dL (ref 8–23)
CO2: 28 mmol/L (ref 22–32)
Calcium: 8.7 mg/dL — ABNORMAL LOW (ref 8.9–10.3)
Chloride: 100 mmol/L (ref 98–111)
Creatinine, Ser: 0.53 mg/dL (ref 0.44–1.00)
GFR calc Af Amer: >60 mL/min (ref >60)
GFR calc non Af Amer: >60 mL/min (ref >60)
Glucose, Bld: 150 mg/dL — ABNORMAL HIGH (ref 70–99)
Potassium: 3.5 mmol/L (ref 3.5–5.1)
Sodium: 140 mmol/L (ref 135–145)
Total Bilirubin: 1.1 mg/dL (ref 0.3–1.2)
Total Protein: 7.3 g/dL (ref 6.5–8.1)

## 2020-05-23 LAB — URINALYSIS, ROUTINE W REFLEX MICROSCOPIC
Bilirubin Urine: NEGATIVE
Glucose, UA: NEGATIVE mg/dL
Hgb urine dipstick: NEGATIVE
Ketones, ur: NEGATIVE mg/dL
Leukocytes,Ua: NEGATIVE
Nitrite: NEGATIVE
Protein, ur: >=300 mg/dL — AB
Specific Gravity, Urine: 1.014 (ref 1.005–1.030)
pH: 6 (ref 5.0–8.0)

## 2020-05-23 LAB — LACTIC ACID, PLASMA: Lactic Acid, Venous: 1.5 mmol/L (ref 0.5–1.9)

## 2020-05-23 LAB — LIPASE, BLOOD: Lipase: 20 U/L (ref 11–51)

## 2020-05-23 LAB — BRAIN NATRIURETIC PEPTIDE: B Natriuretic Peptide: 266.4 pg/mL — ABNORMAL HIGH (ref 0.0–100.0)

## 2020-05-23 LAB — SARS CORONAVIRUS 2 BY RT PCR (HOSPITAL ORDER, PERFORMED IN ~~LOC~~ HOSPITAL LAB): SARS Coronavirus 2: NEGATIVE

## 2020-05-23 MED ORDER — TRAZODONE HCL 50 MG PO TABS: 75.0000 mg | ORAL_TABLET | Freq: Every evening | ORAL | Status: DC | PRN

## 2020-05-23 MED ORDER — ACETAMINOPHEN 325 MG PO TABS: 650.0000 mg | ORAL_TABLET | ORAL | Status: DC | PRN

## 2020-05-23 MED ORDER — ATORVASTATIN CALCIUM 40 MG PO TABS
40.0000 mg | ORAL_TABLET | Freq: Every day | ORAL | Status: DC
  Administered 2020-05-23 – 2020-05-26 (×4): 40 mg via ORAL
  Filled 2020-05-23 (×3): qty 1

## 2020-05-23 MED ORDER — APIXABAN 5 MG PO TABS
5.0000 mg | ORAL_TABLET | Freq: Two times a day (BID) | ORAL | Status: DC
  Administered 2020-05-23 – 2020-05-26 (×6): 5 mg via ORAL
  Filled 2020-05-23 (×7): qty 1

## 2020-05-23 MED ORDER — SODIUM CHLORIDE 0.9% FLUSH
3.0000 mL | Freq: Once | INTRAVENOUS | Status: AC
  Administered 2020-05-23: 3 mL via INTRAVENOUS

## 2020-05-23 MED ORDER — ALBUTEROL SULFATE (2.5 MG/3ML) 0.083% IN NEBU: 3.0000 mL | INHALATION_SOLUTION | Freq: Four times a day (QID) | RESPIRATORY_TRACT | Status: DC | PRN

## 2020-05-23 MED ORDER — SODIUM CHLORIDE 0.9 % IV SOLN: 250.0000 mL | INTRAVENOUS | Status: DC | PRN

## 2020-05-23 MED ORDER — FUROSEMIDE 10 MG/ML IJ SOLN
40.0000 mg | Freq: Once | INTRAMUSCULAR | Status: AC
  Administered 2020-05-23: 40 mg via INTRAVENOUS
  Filled 2020-05-23: qty 4

## 2020-05-23 MED ORDER — IRBESARTAN 300 MG PO TABS
300.0000 mg | ORAL_TABLET | Freq: Every day | ORAL | Status: DC
  Administered 2020-05-23 – 2020-05-26 (×4): 300 mg via ORAL
  Filled 2020-05-23 (×4): qty 1

## 2020-05-23 MED ORDER — SODIUM CHLORIDE 0.9% FLUSH
3.0000 mL | Freq: Two times a day (BID) | INTRAVENOUS | Status: DC
  Administered 2020-05-23 – 2020-05-26 (×6): 3 mL via INTRAVENOUS

## 2020-05-23 MED ORDER — ROTIGOTINE 3 MG/24HR TD PT24: 1.0000 | MEDICATED_PATCH | Freq: Every day | TRANSDERMAL | Status: DC

## 2020-05-23 MED ORDER — METFORMIN HCL 500 MG PO TABS
500.0000 mg | ORAL_TABLET | Freq: Every day | ORAL | Status: DC
  Administered 2020-05-24 – 2020-05-26 (×3): 500 mg via ORAL
  Filled 2020-05-23 (×3): qty 1

## 2020-05-23 MED ORDER — CLOPIDOGREL BISULFATE 75 MG PO TABS
75.0000 mg | ORAL_TABLET | Freq: Every day | ORAL | Status: DC
  Administered 2020-05-23 – 2020-05-26 (×4): 75 mg via ORAL
  Filled 2020-05-23 (×4): qty 1

## 2020-05-23 MED ORDER — SODIUM CHLORIDE 0.9% FLUSH: 3.0000 mL | INTRAVENOUS | Status: DC | PRN

## 2020-05-23 MED ORDER — ONDANSETRON HCL 4 MG/2ML IJ SOLN: 4.0000 mg | Freq: Four times a day (QID) | INTRAMUSCULAR | Status: DC | PRN

## 2020-05-23 NOTE — ED Provider Notes (Signed)
[facility]-EMERGENCY DEPT Provider Note   CSN: [id number] Arrival date & time: 05/23/20  1312     History Chief Complaint  Patient presents with  . Hypertension  . Nausea  . Headache    Cheryl Atkinson is a 86 y.o. female with a past medical history of CHF, COPD, GERD, hypertension, hyperlipidemia, status post lap chole on 04/04/2020 with resulting drain complications who presents today for evaluation of multiple complaints.   She was returned to the emergency room on 04/06/2020 and found to have a perihepatic fluid collection, the drain placement on 5/26.  When followed up on 6/15 it was noted that the drain had been spontaneously dislodged and was on the skin under the bandage.     Patient reports that she started feeling poorly yesterday, chart review shows that yesterday she was complaining of nausea and shortness of breath.  She had diarrhea and was drinking Pedialyte after having loose stools.  Her blood pressure had been elevated despite taking medications as prescribed.  Patient tells me she has had a headache since yesterday.  She was trying to fall asleep when the headache started.  She denies any abdominal pain.  She is feeling short of breath.  She reports that on the way out of the car today she bumped her leg and it started bleeding.    She does not normally require oxygen.  She reports she didn't take her lasix today however took all other medications.   Chart review shows she is on PO linazeolid for VRE.   HPI     Past Medical History:  Diagnosis Date  . Acute cholecystitis 12/29/2019  . Anemia   . Arthritis    "shoulders" (05/04/2015)  . Cellulitis of right lower extremity 11/27/2013  . CHF (congestive heart failure) (HCC)   . Chronic lower back pain   . Constipation   . COPD (chronic obstructive pulmonary disease) (HCC)   . GERD (gastroesophageal reflux disease)   . History of hiatal hernia   . HLD (hyperlipidemia)   . Hypertension   .  Insomnia   . Osteoporosis   . Peripheral arterial disease (HCC)   . Restless leg syndrome   . Thyroid disease     Patient Active Problem List   Diagnosis Date Noted  . Postoperative hematoma involving digestive system following digestive system procedure 04/13/2020  . Encephalopathy 04/07/2020  . AKI (acute kidney injury) (HCC) 04/07/2020  . Acute respiratory failure with hypoxia (HCC) 04/07/2020  . Acute respiratory failure (HCC) 04/06/2020  . S/P laparoscopic cholecystectomy 04/04/2020  . CHF (congestive heart failure) (HCC) 03/01/2020  . Insomnia 03/01/2020  . Bacteremia 01/28/2020  . Acute cholangitis 01/25/2020  . Atrial fibrillation (HCC) 01/25/2020  . Adrenal nodule (HCC) 12/29/2019  . Abdominal aortic ectasia (HCC) 12/29/2019  . Major depression 03/25/2018  . Hypokalemia 12/21/2016  . Cat bite of right hand 12/21/2016  . BPPV (benign paroxysmal positional vertigo) 07/12/2016  . Memory loss 04/11/2016  . Mallet toe of right foot 10/20/2015  . Renal artery stenosis (HCC) 09/27/2015  . Hyperlipidemia 06/30/2015  . Claudication (HCC) 05/04/2015  . Atherosclerotic PVD with intermittent claudication (HCC) 04/26/2015  . Tinnitus 12/31/2014  . Former smoker 09/29/2014  . DNR (do not resuscitate) 09/29/2014  . Diabetes mellitus (HCC) 07/19/2014  . Multinodular goiter 08/30/2013  . Spinal stenosis of lumbar region at multiple levels 09/29/2012  . HIP PAIN, BILATERAL 07/16/2010  . COPD (chronic obstructive pulmonary disease) (HCC) 09/20/2009  . CONSTIPATION, CHRONIC  09/20/2009  . Iron deficiency anemia 11/09/2007  . History of UTI 11/09/2007  . RESTLESS LEG SYNDROME 09/25/2007  . Essential hypertension 09/25/2007  . GERD 09/25/2007  . LOW BACK PAIN 09/25/2007  . Osteoporosis 09/25/2007    Past Surgical History:  Procedure Laterality Date  . ABDOMINAL AORTAGRAM  05/04/2015   Procedure: Abdominal Aortagram;  Surgeon: Lorretta Harp, MD;  Location: [place] [facility];   Service: Cardiovascular;;  . APPENDECTOMY    . CATARACT EXTRACTION W/ INTRAOCULAR LENS  IMPLANT, BILATERAL Bilateral   . CHOLECYSTECTOMY N/A 04/04/2020   Procedure: LAPAROSCOPIC CHOLECYSTECTOMY;  Surgeon: Georganna Skeans, MD;  Location: [place];  Service: General;  Laterality: N/A;  . I & D EXTREMITY Right 12/22/2016   Procedure: IRRIGATION AND DEBRIDEMENT EXTREMITY;  Surgeon: Milly Jakob, MD;  Location: [place];  Service: Orthopedics;  Laterality: Right;  . IR EXCHANGE BILIARY DRAIN  03/13/2020  . IR PATIENT EVAL TECH 0-60 MINS  12/30/2019  . IR PERC CHOLECYSTOSTOMY  01/27/2020  . IR RADIOLOGIST EVAL & MGMT  05/16/2020  . PERIPHERAL VASCULAR CATHETERIZATION N/A 05/04/2015   Procedure: Lower Extremity Angiography;  Surgeon: Lorretta Harp, MD;  Location: [place] [facility];  Service: Cardiovascular;  Laterality: N/A;  . PERIPHERAL VASCULAR CATHETERIZATION  05/04/2015   Procedure: Peripheral Vascular Intervention;  Surgeon: Lorretta Harp, MD;  Location: [place] [facility];  Service: Cardiovascular;;  RCIA - 7x22 ICAST  . PERIPHERAL VASCULAR CATHETERIZATION Right 09/04/2015   Procedure: Peripheral Vascular Atherectomy;  Surgeon: Lorretta Harp, MD;  Location: [place] [facility];  Service: Cardiovascular;  Laterality: Right;  SFA  . sfa Right 09/04/2015   de balloon  . THYROID SURGERY Right ?2013   "had goiter taken off my neck"  . TONSILLECTOMY    . VAGINAL HYSTERECTOMY       OB History   No obstetric history on file.     Family History  Problem Relation Age of Onset  . Heart disease Father   . Heart attack Father   . Heart disease Mother   . Coronary artery disease Other   . Atrial fibrillation Sister   . Stroke Maternal Grandmother   . Cancer Paternal Grandmother     Social History   Tobacco Use  . Smoking status: Former Smoker    Packs/day: 0.50    Years: 60.00    Pack years: 30.00    Types: Cigarettes    Quit date: 04/02/2015    Years since quitting: 5.1  . Smokeless  tobacco: Never Used  Vaping Use  . Vaping Use: Never used  Substance Use Topics  . Alcohol use: No  . Drug use: No    Home Medications Prior to Admission medications   Medication Sig Start Date End Date Taking? Authorizing Provider  Albuterol Sulfate (PROAIR RESPICLICK) 267 (90 Base) MCG/ACT AEPB Inhale 2 puffs into the lungs every 6 (six) hours as needed (shortness of breath from COPD). 12/29/18   Marin Olp, MD  apixaban (ELIQUIS) 5 MG TABS tablet Take 1 tablet (5 mg total) by mouth 2 (two) times daily. Start 04/06/20 04/05/20   Georganna Skeans, MD  atorvastatin (LIPITOR) 40 MG tablet Take 1 tablet (40 mg total) by mouth daily. 03/16/20   Marin Olp, MD  bisacodyl (DULCOLAX) 10 MG suppository Place 1 suppository (10 mg total) rectally daily as needed for moderate constipation. 04/17/20   Hollice Gong, Mir Earlie Server, MD  clopidogrel (PLAVIX) 75 MG tablet Take 1 tablet (75 mg total)  by mouth daily. Start 04/06/20 04/05/20   Violeta Gelinashompson, Burke, MD  furosemide (LASIX) 40 MG tablet Take 1 tablet (40 mg total) by mouth every other day. 04/06/20   Shelva MajesticHunter, Stephen O, MD  irbesartan (AVAPRO) 300 MG tablet Take 1 tablet (300 mg total) by mouth daily. 03/25/20   Shelva MajesticHunter, Stephen O, MD  metFORMIN (GLUCOPHAGE) 500 MG tablet Take 500 mg by mouth daily. 04/23/20   [provider]  Multiple Vitamins-Minerals (PRESERVISION AREDS 2 PO) Take 1 tablet by mouth 2 (two) times daily.    [provider]  NEUPRO 3 MG/24HR PT24 PLACE 1 PATCH (3 MG) ONTO THE SKIN AT BEDTIME Patient taking differently: Apply 1 patch topically at bedtime. Take off in the morning 03/29/20   Shelva MajesticHunter, Stephen O, MD  polyethylene glycol (MIRALAX / GLYCOLAX) 17 g packet Take 17 g by mouth 2 (two) times daily. Reported on 03/14/2016 Patient taking differently: Take 17 g by mouth daily as needed for mild constipation.  01/03/20   Hongalgi, Maximino GreenlandAnand D, MD  potassium chloride SA (KLOR-CON) 20 MEQ tablet Take 1 tablet (20 mEq total) by mouth  daily as needed (take on days that you take lasix). Patient taking differently: Take 20 mEq by mouth every other day.  03/25/20   Shelva MajesticHunter, Stephen O, MD  sodium chloride flush (NS) 0.9 % SOLN 5 mLs by Intracatheter route every 8 (eight) hours. Patient not taking: Reported on 04/06/2020 01/03/20   Juliet RudeJohnson, Kelly R, PA-C  traZODone (DESYREL) 50 MG tablet Take 1.5 tablets (75 mg total) by mouth at bedtime as needed for sleep. 04/17/20   Colon BranchIkramullah, Mir Mohammed, MD    Allergies    Codeine  Review of Systems   Review of Systems  Constitutional: Positive for fatigue. Negative for chills and fever.  HENT: Negative for congestion.   Respiratory: Positive for shortness of breath.   Gastrointestinal: Positive for diarrhea and nausea. Negative for abdominal pain.  Genitourinary: Negative for dysuria and urgency.  Musculoskeletal: Positive for back pain (Chronic, unchanged). Negative for neck pain.  Skin: Positive for wound.  Neurological: Positive for headaches. Negative for weakness.  All other systems reviewed and are negative.   Physical Exam Updated Vital Signs BP (!) 162/91   Pulse 62   Temp 97.9 F (36.6 C) (Oral)   Resp (!) 28   LMP  (LMP Unknown)   SpO2 96%   Physical Exam Vitals and nursing note reviewed.  Constitutional:      Appearance: She is well-developed. She is ill-appearing. She is not diaphoretic.  HENT:     Head: Normocephalic and atraumatic.  Eyes:     General: No scleral icterus.       Right eye: No discharge.        Left eye: No discharge.     Conjunctiva/sclera: Conjunctivae normal.  Cardiovascular:     Rate and Rhythm: Normal rate and regular rhythm.     Pulses: Normal pulses.     Heart sounds: Normal heart sounds.  Pulmonary:     Effort: Tachypnea and accessory muscle usage present. No respiratory distress.     Breath sounds: No stridor. Examination of the right-middle field reveals decreased breath sounds. Examination of the right-lower field reveals  decreased breath sounds. Examination of the left-lower field reveals decreased breath sounds. Decreased breath sounds present.  Abdominal:     General: There is no distension.     Tenderness: There is no abdominal tenderness.     Comments: Wounds appear well healed.  Musculoskeletal:        General: No deformity.     Cervical back: Normal range of motion.     Right lower leg: Edema present.     Left lower leg: Edema present.  Skin:    General: Skin is warm and dry.     Comments: Wound on left lower leg  Is superficial abrasion no active bleeding.  Diffuse ecchymosis scattered on bilateral arms and legs.  Ecchymosis over left dorsal foot/ankle.   Neurological:     Mental Status: She is alert.     Cranial Nerves: No cranial nerve deficit.     Motor: No abnormal muscle tone.  Psychiatric:        Mood and Affect: Mood normal.        Behavior: Behavior normal.     ED Results / Procedures / Treatments   Labs (all labs ordered are listed, but only abnormal results are displayed) Labs Reviewed  COMPREHENSIVE METABOLIC PANEL - Abnormal; Notable for the following components:      Result Value   Glucose, Bld 150 (*)    Calcium 8.7 (*)    Albumin 3.4 (*)    All other components within normal limits  BRAIN NATRIURETIC PEPTIDE - Abnormal; Notable for the following components:   B Natriuretic Peptide 266.4 (*)    All other components within normal limits  CBC WITH DIFFERENTIAL/PLATELET - Abnormal; Notable for the following components:   RBC 3.32 (*)    Hemoglobin 8.5 (*)    HCT 28.7 (*)    MCH 25.6 (*)    MCHC 29.6 (*)    RDW 17.1 (*)    Neutro Abs 8.5 (*)    All other components within normal limits  CULTURE, BLOOD (ROUTINE X 2)  CULTURE, BLOOD (ROUTINE X 2)  URINE CULTURE  SARS CORONAVIRUS 2 BY RT PCR (HOSPITAL ORDER, PERFORMED IN [facility])  LIPASE, BLOOD  LACTIC ACID, PLASMA  URINALYSIS, ROUTINE W REFLEX MICROSCOPIC    EKG None  Radiology DG Chest Port  1 View  Result Date: 05/23/2020 CLINICAL DATA:  Hypoxia. EXAM: PORTABLE CHEST 1 VIEW COMPARISON:  CT 04/11/2020.  Chest x-ray 04/10/2020. FINDINGS: Mediastinum appears stable. Stable cardiomegaly and tortuosity of the thoracic aorta. Pulmonary venous congestion and mild increased bilateral interstitial prominence. Findings suggest CHF. Progression of right pleural effusion. Small new left pleural effusion. No pneumothorax. IMPRESSION: Cardiomegaly with pulmonary venous congestion mild increased bilateral interstitial prominence. Progressive right pleural effusion. Small new left pleural effusion. Findings suggest CHF. Electronically Signed   By: Maisie Fus  Register   On: 05/23/2020 14:37    Procedures Procedures (including critical care time)  Medications Ordered in ED Medications  sodium chloride flush (NS) 0.9 % injection 3 mL (3 mLs Intravenous Given 05/23/20 1403)  furosemide (LASIX) injection 40 mg (40 mg Intravenous Given 05/23/20 1520)    ED Course  I have reviewed the triage vital signs and the nursing notes.  Pertinent labs & imaging results that were available during my care of the patient were reviewed by me and considered in my medical decision making (see chart for details).    MDM Rules/Calculators/A&P                         Patient is a 86 year old woman who presents today for evaluation of shortness of breath, hypertension nausea and headache.  On exam she generally appears fluid overloaded with pitting edema bilaterally.  Chest x-ray is obtained  showing pulmonary edema with pleural effusion.  This is consistent with her CHF.  She is hypertensive here at 162/91.  She did not take her Lasix today which I suspect is significantly contributing.  Of note she has recent surgical history including cholecystectomy and drain.  Her white count is improved from previous at 10.1.  BNP is elevated at 266.  CMP is overall unremarkable.  Lactic acid is not elevated, she is afebrile not  consistently tachycardic, do not suspect Sirs/sepsis.  She has been taking linezolid for her known VRE and has not been missing doses.    Here she is notably hypoxic at 85% on room air.  She is slightly nauseous with headache.  Covid test sent.  I suspect that her symptoms are primarily related to fluid overload.  She is given IV Lasix.  Given her new hypoxia plan to admit.  Of note she does have an abrasion, this is superficial, does not require repair and her tetanus is up-to-date.  This patient was seen as a shared visit with Dr. Rosalia Hammers.  Plan to admit.    IV lasix has been ordered.    I spoke with Hospitalist who will see patient for admission.   Note: Portions of this report may have been transcribed using voice recognition software. Every effort was made to ensure accuracy; however, inadvertent computerized transcription errors may be present   Final Clinical Impression(s) / ED Diagnoses Final diagnoses:  Acute respiratory failure with hypoxia (HCC)  Abrasion of left lower extremity, initial encounter  Acute on chronic congestive heart failure, unspecified heart failure type [facility])    Rx / DC Orders ED Discharge Orders    None       Norman Clay 05/23/20 1535    Margarita Grizzle, MD 05/25/20 1348

## 2020-05-23 NOTE — Telephone Encounter (Signed)
Agree with plan 

## 2020-05-23 NOTE — Progress Notes (Signed)
Patient was not seen by me. See telephone encounter dated today for more details.  Jarold Motto PA-C

## 2020-05-23 NOTE — Telephone Encounter (Signed)
Spoke to pt asked her how she was feeling today? Pt said fine. Asked her if she is having SOB or trouble breathing? Pt said no. Anymore nose bleeds? Pt said no. Are you still having nausea? Pt said yes. Asked pt what her blood pressure is today? Pt said she does not know is not able to check. Any headaches or dizziness? Pt said slight headache. Told pt when she comes in today if her blood pressure is high we will be sending her to the ED to be evaluated. Told her she can go now or what till appt. Pt verbalized understanding and said she will wait and come to appt. Told pt okay but if symptoms worsen need to go to the ED. Pt verbalized understanding.

## 2020-05-23 NOTE — ED Notes (Signed)
Messaged pharmacy to verify medications. Unable to give at this time

## 2020-05-23 NOTE — ED Triage Notes (Signed)
Pt reports left here two days ago. Has headache and nausea since. Reports was sent by her PCP for high BP today at their office.  Pt came in with left leg bleeding when assisted out of car. this RN cleaned with saline moistened gauze and wrapped with coban.

## 2020-05-23 NOTE — ED Notes (Signed)
Pt unable to hold o2 saturation MD aware

## 2020-05-23 NOTE — ED Notes (Signed)
Pts lower dentures removed per requested, labeled in case, and placed in belongings bag. Upper dentures remain in place.

## 2020-05-23 NOTE — Telephone Encounter (Signed)
Samantha please sign off on note. Thanks

## 2020-05-23 NOTE — Telephone Encounter (Signed)
Pt presented to the office today for a visit with her cousin who is her caregiver. Pt is c/o nausea and not feeling well. Pt had diarrhea yesterday. Pt's Blood pressure was 186/90, pulse:104, pulse ox: 72, told pt to take a few deep breaths and pulse ox came up to 82. Pt had bilateral lower extremity edema. Pt has not take her lasix in a few days according to caregiver. Discussed with Lelon Mast and was told to send pt to the ED. Told pt and caregiver that they need to go to the ED to be evaluated per [facility] due to elevated blood pressure and low oxygen level, pulse is elevated also. Both verbalized understanding and will go to [employer].

## 2020-05-23 NOTE — H&P (Signed)
HPI  Cheryl Atkinson OTR:[id number] DOB: 07/01/1931 DOA: 05/23/2020  PCP: Shelva Majestic, MD   Chief Complaint: Multiple  HPI:  86 year old white female Known COPD Reflux 1.4 cm left adrenal nodule Prediabetes Prior UTIs Prior R SFA chronic occlusion  paroxysmal A. Fib CHADS2 score >4 on Eliquis previously  Index admission 01/03/2020-had cholecystotomy drain 1/20 as she was a poor surgical candidate Lap chole 04/05/2020 readmission 5/6 through 04/17/2020 lethargy confusion 2/2 opiate narcosis CT chest showed subdiaphragmatic abscess-started on Zosyn antifungal general surgery recommended IR drain-Rx linezolid and sent to skilled facility She is apparently  since returned home and IR drain has been removed about a week prior  Endorses coming to PCP visit--Per the phone note she had blood pressure of 186 pulse of 104 bilateral edema and had not taken Lasix in a couple of days and was told to go to the ED-she was not seen in the office  Had a nose bleed ~ 2 days ago and was bleeding "real bad" Ann Maki tells me her cousin gives the meds to the patient--she wasn't taking lasix unclear reasons. Son doesn't know about whether she is on Antibiotics  In the ED kept n.p.o. urinalysis performed given Lasix x1 40 mg and although she required 2 L of oxygen on coming into the ED-after given Lasix and being taken off of oxygen [place] satting in the 92-94 range which I suspect would be normal for a lady in her 67s   Review of Systems:   Pertinent +'s: Short of breath, lower extremity swelling X several days, malaise Pertinent -"s: Fever, chills, blurred vision, double vision, recent fall, hematemesis, dark stool, unilateral weakness She does state that she sometimes has headaches No rash no new medications and except as above is negative  ED Course: Work-up in the ED showed BNP 266, CXR = cardiomegaly pulmonary venous congestion interstitial prominence bilaterally and new left small pleural  effusion For some reason this was a 1 view x-ray  In fact she is asking if she can go home!    Past Medical History:  Diagnosis Date  . Acute cholecystitis 12/29/2019  . Anemia   . Arthritis    "shoulders" (05/04/2015)  . Cellulitis of right lower extremity 11/27/2013  . CHF (congestive heart failure) (HCC)   . Chronic lower back pain   . Constipation   . COPD (chronic obstructive pulmonary disease) (HCC)   . GERD (gastroesophageal reflux disease)   . History of hiatal hernia   . HLD (hyperlipidemia)   . Hypertension   . Insomnia   . Osteoporosis   . Peripheral arterial disease (HCC)   . Restless leg syndrome   . Thyroid disease    Past Surgical History:  Procedure Laterality Date  . ABDOMINAL AORTAGRAM  05/04/2015   Procedure: Abdominal Aortagram;  Surgeon: Runell Gess, MD;  Location: [facility] INVASIVE CV LAB;  Service: Cardiovascular;;  . APPENDECTOMY    . CATARACT EXTRACTION W/ INTRAOCULAR LENS  IMPLANT, BILATERAL Bilateral   . CHOLECYSTECTOMY N/A 04/04/2020   Procedure: LAPAROSCOPIC CHOLECYSTECTOMY;  Surgeon: Violeta Gelinas, MD;  Location: [facility] OR;  Service: General;  Laterality: N/A;  . I & D EXTREMITY Right 12/22/2016   Procedure: IRRIGATION AND DEBRIDEMENT EXTREMITY;  Surgeon: Mack Hook, MD;  Location: [facility] OR;  Service: Orthopedics;  Laterality: Right;  . IR EXCHANGE BILIARY DRAIN  03/13/2020  . IR PATIENT EVAL TECH 0-60 MINS  12/30/2019  . IR PERC CHOLECYSTOSTOMY  01/27/2020  . IR RADIOLOGIST EVAL & MGMT  05/16/2020  . PERIPHERAL VASCULAR CATHETERIZATION N/A 05/04/2015   Procedure: Lower Extremity Angiography;  Surgeon: Lorretta Harp, MD;  Location: [place] CV LAB;  Service: Cardiovascular;  Laterality: N/A;  . PERIPHERAL VASCULAR CATHETERIZATION  05/04/2015   Procedure: Peripheral Vascular Intervention;  Surgeon: Lorretta Harp, MD;  Location: [place] CV LAB;  Service: Cardiovascular;;  RCIA - 7x22 ICAST  . PERIPHERAL VASCULAR CATHETERIZATION Right 09/04/2015    Procedure: Peripheral Vascular Atherectomy;  Surgeon: Lorretta Harp, MD;  Location: [place] CV LAB;  Service: Cardiovascular;  Laterality: Right;  SFA  . sfa Right 09/04/2015   de balloon  . THYROID SURGERY Right ?2013   "had goiter taken off my neck"  . TONSILLECTOMY    . VAGINAL HYSTERECTOMY      reports that she quit smoking about 5 years ago. Her smoking use included cigarettes. She has a 30.00 pack-year smoking history. She has never used smokeless tobacco. She reports that she does not drink alcohol and does not use drugs.  Mobility: Ambulates with both cane and walker at home--- she was driving up to about a month ago Lives with her and another relative Used to work in [employer], physiological to 12th grade   Quit smoking 10 years ago never drinker  Allergies  Allergen Reactions  . Codeine Other (See Comments)    REACTION: Syncope    Family History  Problem Relation Age of Onset  . Heart disease Father   . Heart attack Father   . Heart disease Mother   . Coronary artery disease Other   . Atrial fibrillation Sister   . Stroke Maternal Grandmother   . Cancer Paternal Grandmother    Prior to Admission medications   Medication Sig Start Date End Date Taking? Authorizing Provider  Albuterol Sulfate (PROAIR RESPICLICK) 517 (90 Base) MCG/ACT AEPB Inhale 2 puffs into the lungs every 6 (six) hours as needed (shortness of breath from COPD). 12/29/18   Marin Olp, MD  apixaban (ELIQUIS) 5 MG TABS tablet Take 1 tablet (5 mg total) by mouth 2 (two) times daily. Start 04/06/20 04/05/20   Georganna Skeans, MD  atorvastatin (LIPITOR) 40 MG tablet Take 1 tablet (40 mg total) by mouth daily. 03/16/20   Marin Olp, MD  bisacodyl (DULCOLAX) 10 MG suppository Place 1 suppository (10 mg total) rectally daily as needed for moderate constipation. 04/17/20   Hollice Gong, Mir Earlie Server, MD  clopidogrel (PLAVIX) 75 MG tablet Take 1 tablet (75 mg total) by mouth daily. Start 04/06/20 04/05/20    Georganna Skeans, MD  furosemide (LASIX) 40 MG tablet Take 1 tablet (40 mg total) by mouth every other day. 04/06/20   Marin Olp, MD  irbesartan (AVAPRO) 300 MG tablet Take 1 tablet (300 mg total) by mouth daily. 03/25/20   Marin Olp, MD  metFORMIN (GLUCOPHAGE) 500 MG tablet Take 500 mg by mouth daily. 04/23/20   [provider]  Multiple Vitamins-Minerals (PRESERVISION AREDS 2 PO) Take 1 tablet by mouth 2 (two) times daily.    [provider]  NEUPRO 3 MG/24HR PT24 PLACE 1 PATCH (3 MG) ONTO THE SKIN AT BEDTIME Patient taking differently: Apply 1 patch topically at bedtime. Take off in the morning 03/29/20   Marin Olp, MD  polyethylene glycol (MIRALAX / GLYCOLAX) 17 g packet Take 17 g by mouth 2 (two) times daily. Reported on 03/14/2016 Patient taking differently: Take 17 g by mouth daily as needed for mild constipation.  01/03/20  Hongalgi, Maximino Greenland, MD  potassium chloride SA (KLOR-CON) 20 MEQ tablet Take 1 tablet (20 mEq total) by mouth daily as needed (take on days that you take lasix). Patient taking differently: Take 20 mEq by mouth every other day.  03/25/20   Shelva Majestic, MD  sodium chloride flush (NS) 0.9 % SOLN 5 mLs by Intracatheter route every 8 (eight) hours. Patient not taking: Reported on 04/06/2020 01/03/20   Juliet Rude, PA-C  traZODone (DESYREL) 50 MG tablet Take 1.5 tablets (75 mg total) by mouth at bedtime as needed for sleep. 04/17/20   Colon Branch, MD    Physical Exam:  Vitals:   05/23/20 1500 05/23/20 1510  BP: (!) 162/91   Pulse: 63 62  Resp: (!) 22 (!) 28  Temp:    SpO2: 96%      Awake coherent looks about stated age no icterus no pallor and no cardiorespiratory distress  EOMI NCAT  Cannot appreciate JVD  No bruit  No submandibular lymphadenopathy no thyromegaly  S1-S2 irregularly irregular rate controlled-on monitor she has A. fib  Abdomen soft no rebound no guarding  Grade 2-3 lower extremity  edema  ROM intact  Neurologically intact moving all 4 limbs equally power 5/5 full exam deferred  I have personally reviewed following labs and imaging studies  Labs:   BUN/creatinine 18/0.5 Albumin 3.4  BNP 266  WBC 10.1  Hemoglobin 8.5  Platelet 311  Imaging studies:   Chest x-ray showed cardiomegaly venous congestion and possible left pleural effusion  Medical tests:   EKG independently reviewed: Atrial fibrillation  Test discussed with performing physician:  yes   Decision to obtain old records:   yes   Review and summation of old records:   yes   Active Problems:   * No active hospital problems. *   Assessment/Plan Acute exacerbation of HFpEF  Lasix x1 given in ED  Diuresis with Lasix 40 mg daily IV tomorrow a.m. and may be can transition back to home regimen in the afternoon tomorrow days-hesitate to use higher doses given her age and con commitment ARB  Her usual weight is 141 pounds in which she will need daily weights standing and strict I's and O's She is not on a beta-blocker for some reason  Paroxysmal A. fib CHADS2 score >4 Continue Eliquis 5 mg twice daily I am not sure the indication for her Plavix 75 and this will need to be discussed with her PCP  Recent subdiaphragmatic abscess (see HPI for details) She is supposed to be on linezolid to complete therapy-I do not see that has been reconciled we will ask pharmacy to look into her meds She has absolutely no chest pain or abdominal pain at this time and the drain was removed apparently last week Hyperlipidemia continue Lipitor 40  ?  COPD continue albuterol as rescue inhaler With LAMA laba or inhaled steroids?  Hyperglycemia NOS?  Diabetes Resume Metformin 500-no risk for metabolic acidosis currently-if blood sugars above 180 will institute sliding scale  Frailty in the elderly May require PT input for safe discharge-May require home health we will asked TOC to look in  Severity of  Illness: The appropriate patient status for this patient is OBSERVATION. Observation status is judged to be reasonable and necessary in order to provide the required intensity of service to ensure the patient's safety. The patient's presenting symptoms, physical exam findings, and initial radiographic and laboratory data in the context of their medical condition is felt to  place them at decreased risk for further clinical deterioration. Furthermore, it is anticipated that the patient will be medically stable for discharge from the hospital within 2 midnights of admission. The following factors support the patient status of observation.   " The patient's presenting symptoms include sob. " The physical exam findings include sob and increased. " The initial radiographic and laboratory data are some what reassuring  DVT prophylaxis:eliquis Code Status: dnar confirmed at  [facility] Communication: spoke with son greg--she has had SOB since coming home from rehab a couple of weeks ago--she hasn't bene able to walk--she wasn't apparently able to meet goals there?   Consults called: none    Time spent: 65 minutes  Mahala Menghini, MD [days-call my NP partners at night for Care related issues] [facility] --Via amion app OR , [email or web address]; password [facility]  05/23/2020, 3:25 PM

## 2020-05-24 DIAGNOSIS — J449 Chronic obstructive pulmonary disease, unspecified: Secondary | ICD-10-CM | POA: Diagnosis present

## 2020-05-24 DIAGNOSIS — Z20822 Contact with and (suspected) exposure to covid-19: Secondary | ICD-10-CM | POA: Diagnosis present

## 2020-05-24 DIAGNOSIS — Z79899 Other long term (current) drug therapy: Secondary | ICD-10-CM | POA: Diagnosis not present

## 2020-05-24 DIAGNOSIS — Z823 Family history of stroke: Secondary | ICD-10-CM | POA: Diagnosis not present

## 2020-05-24 DIAGNOSIS — M810 Age-related osteoporosis without current pathological fracture: Secondary | ICD-10-CM | POA: Diagnosis present

## 2020-05-24 DIAGNOSIS — Z90710 Acquired absence of both cervix and uterus: Secondary | ICD-10-CM | POA: Diagnosis not present

## 2020-05-24 DIAGNOSIS — Z7902 Long term (current) use of antithrombotics/antiplatelets: Secondary | ICD-10-CM | POA: Diagnosis not present

## 2020-05-24 DIAGNOSIS — Z66 Do not resuscitate: Secondary | ICD-10-CM | POA: Diagnosis present

## 2020-05-24 DIAGNOSIS — G2581 Restless legs syndrome: Secondary | ICD-10-CM | POA: Diagnosis present

## 2020-05-24 DIAGNOSIS — I5033 Acute on chronic diastolic (congestive) heart failure: Secondary | ICD-10-CM

## 2020-05-24 DIAGNOSIS — Z87891 Personal history of nicotine dependence: Secondary | ICD-10-CM | POA: Diagnosis not present

## 2020-05-24 DIAGNOSIS — M2550 Pain in unspecified joint: Secondary | ICD-10-CM | POA: Diagnosis not present

## 2020-05-24 DIAGNOSIS — E1151 Type 2 diabetes mellitus with diabetic peripheral angiopathy without gangrene: Secondary | ICD-10-CM | POA: Diagnosis present

## 2020-05-24 DIAGNOSIS — I110 Hypertensive heart disease with heart failure: Secondary | ICD-10-CM | POA: Diagnosis present

## 2020-05-24 DIAGNOSIS — I509 Heart failure, unspecified: Secondary | ICD-10-CM | POA: Diagnosis not present

## 2020-05-24 DIAGNOSIS — E785 Hyperlipidemia, unspecified: Secondary | ICD-10-CM | POA: Diagnosis present

## 2020-05-24 DIAGNOSIS — Z8249 Family history of ischemic heart disease and other diseases of the circulatory system: Secondary | ICD-10-CM | POA: Diagnosis not present

## 2020-05-24 DIAGNOSIS — I480 Paroxysmal atrial fibrillation: Secondary | ICD-10-CM

## 2020-05-24 DIAGNOSIS — Z87440 Personal history of urinary (tract) infections: Secondary | ICD-10-CM | POA: Diagnosis not present

## 2020-05-24 DIAGNOSIS — J9601 Acute respiratory failure with hypoxia: Secondary | ICD-10-CM | POA: Diagnosis present

## 2020-05-24 DIAGNOSIS — Z7901 Long term (current) use of anticoagulants: Secondary | ICD-10-CM | POA: Diagnosis not present

## 2020-05-24 DIAGNOSIS — F29 Unspecified psychosis not due to a substance or known physiological condition: Secondary | ICD-10-CM | POA: Diagnosis not present

## 2020-05-24 DIAGNOSIS — E278 Other specified disorders of adrenal gland: Secondary | ICD-10-CM | POA: Diagnosis present

## 2020-05-24 DIAGNOSIS — R4182 Altered mental status, unspecified: Secondary | ICD-10-CM | POA: Diagnosis not present

## 2020-05-24 DIAGNOSIS — J9600 Acute respiratory failure, unspecified whether with hypoxia or hypercapnia: Secondary | ICD-10-CM | POA: Diagnosis not present

## 2020-05-24 DIAGNOSIS — Z7984 Long term (current) use of oral hypoglycemic drugs: Secondary | ICD-10-CM | POA: Diagnosis not present

## 2020-05-24 DIAGNOSIS — K219 Gastro-esophageal reflux disease without esophagitis: Secondary | ICD-10-CM | POA: Diagnosis present

## 2020-05-24 DIAGNOSIS — Z7401 Bed confinement status: Secondary | ICD-10-CM | POA: Diagnosis not present

## 2020-05-24 LAB — URINE CULTURE

## 2020-05-24 LAB — BASIC METABOLIC PANEL
Anion gap: 9 (ref 5–15)
BUN: 18 mg/dL (ref 8–23)
CO2: 29 mmol/L (ref 22–32)
Calcium: 8.2 mg/dL — ABNORMAL LOW (ref 8.9–10.3)
Chloride: 101 mmol/L (ref 98–111)
Creatinine, Ser: 0.57 mg/dL (ref 0.44–1.00)
GFR calc Af Amer: >60 mL/min (ref >60)
GFR calc non Af Amer: >60 mL/min (ref >60)
Glucose, Bld: 121 mg/dL — ABNORMAL HIGH (ref 70–99)
Potassium: 3.1 mmol/L — ABNORMAL LOW (ref 3.5–5.1)
Sodium: 139 mmol/L (ref 135–145)

## 2020-05-24 LAB — CBC
HCT: 30.6 % — ABNORMAL LOW (ref 36.0–46.0)
Hemoglobin: 9.2 g/dL — ABNORMAL LOW (ref 12.0–15.0)
MCH: 26.1 pg (ref 26.0–34.0)
MCHC: 30.1 g/dL (ref 30.0–36.0)
MCV: 86.7 fL (ref 80.0–100.0)
Platelets: 261 K/uL (ref 150–400)
RBC: 3.53 MIL/uL — ABNORMAL LOW (ref 3.87–5.11)
RDW: 16.9 % — ABNORMAL HIGH (ref 11.5–15.5)
WBC: 11.3 K/uL — ABNORMAL HIGH (ref 4.0–10.5)
nRBC: 0 % (ref 0.0–0.2)

## 2020-05-24 MED ORDER — POTASSIUM CHLORIDE CRYS ER 20 MEQ PO TBCR
40.0000 meq | EXTENDED_RELEASE_TABLET | Freq: Once | ORAL | Status: AC
  Administered 2020-05-24: 40 meq via ORAL
  Filled 2020-05-24: qty 2

## 2020-05-24 MED ORDER — FUROSEMIDE 40 MG PO TABS
40.0000 mg | ORAL_TABLET | ORAL | Status: DC
  Administered 2020-05-24: 40 mg via ORAL
  Filled 2020-05-24: qty 1

## 2020-05-24 NOTE — Progress Notes (Addendum)
PROGRESS NOTE    Cheryl Atkinson  VQQ:[id number] DOB: 1931/01/31 DOA: 05/23/2020 PCP: Shelva Majestic, MD   Brief Narrative: Cheryl Atkinson is a 86 y.o. female with a history of COPD, GERD, diabetes mellitus, type 2, paroxysmal atrial fibrillation on Eliquis. Patient presented secondary to elevated BP and found to have evidence of acute heart failure. Given IV lasix.   Assessment & Plan:   Active Problems:   COPD (chronic obstructive pulmonary disease) (HCC)   Atrial fibrillation (HCC)   Heart failure (HCC)   Acute on chronic diastolic CHF (congestive heart failure) (HCC)   Acute on chronic diastolic heart failure Managed with IV lasix on admission. No current symptoms. UOP of 700 mL over the last 24 hours. -Resume home lasix 40 mg PO  Acute respiratory failure with hypoxia Secondary to above -Wean to room air  History of subdiaphragmatic abscess Patient was on linezolid but course is completed by now.  COPD Stable -Continue  Diabetes mellitus, type 2 Last hemoglobin A1C of 5.9. On metformin as an outpatient. -Continue metformin  Paroxysmal atrial fibrillation -Continue Eliquis   DVT prophylaxis: Eliquis Code Status:   Code Status: DNR Family Communication: None at bedside Disposition Plan: Discharge home in 1-2 days once able to wean oxygen to room air. PT recommending SNF, however, patient declines.   Consultants:   None  Procedures:   None  Antimicrobials:  None    Subjective: No dyspnea or chest pain. Ready to go home.  Objective: Vitals:   05/24/20 0700 05/24/20 1130 05/24/20 1200 05/24/20 1259  BP: 130/61 (!) 147/65 (!) 142/62 (!) 154/64  Pulse: 70 63 60 69  Resp: 18 (!) 27 (!) 28 (!) 22  Temp:    97.7 F (36.5 C)  TempSrc:    Oral  SpO2: 100% 100%  100%    Intake/Output Summary (Last 24 hours) at 05/24/2020 1543 Last data filed at 05/24/2020 1052 Gross per 24 hour  Intake --  Output 1300 ml  Net -1300 ml   There were no  vitals filed for this visit.  Examination:  General exam: Appears calm and comfortable Respiratory system: Clear to auscultation. Respiratory effort normal. Cardiovascular system: S1 & S2 heard, RRR. No murmurs, rubs, gallops or clicks. Gastrointestinal system: Abdomen is slightly distended, soft and nontender. No organomegaly or masses felt. Normal bowel sounds heard. Central nervous system: Alert and oriented. No focal neurological deficits. Musculoskeletal: BLE 2+ edema. No calf tenderness Skin: No cyanosis. No rashes Psychiatry: Judgement and insight appear normal. Mood & affect appropriate.     Data Reviewed: I have personally reviewed following labs and imaging studies  CBC Lab Results  Component Value Date   WBC 11.3 (H) 05/24/2020   RBC 3.53 (L) 05/24/2020   HGB 9.2 (L) 05/24/2020   HCT 30.6 (L) 05/24/2020   MCV 86.7 05/24/2020   MCH 26.1 05/24/2020   PLT 261 05/24/2020   MCHC 30.1 05/24/2020   RDW 16.9 (H) 05/24/2020   LYMPHSABS 0.9 05/23/2020   MONOABS 0.6 05/23/2020   EOSABS 0.0 05/23/2020   BASOSABS 0.0 05/23/2020     Last metabolic panel Lab Results  Component Value Date   NA 139 05/24/2020   K 3.1 (L) 05/24/2020   CL 101 05/24/2020   CO2 29 05/24/2020   BUN 18 05/24/2020   CREATININE 0.57 05/24/2020   GLUCOSE 121 (H) 05/24/2020   GFRNONAA >60 05/24/2020   GFRAA >60 05/24/2020   CALCIUM 8.2 (L) 05/24/2020   PROT 7.3 05/23/2020  ALBUMIN 3.4 (L) 05/23/2020   BILITOT 1.1 05/23/2020   ALKPHOS 110 05/23/2020   AST 20 05/23/2020   ALT 23 05/23/2020   ANIONGAP 9 05/24/2020    CBG (last 3)  No results for input(s): GLUCAP in the last 72 hours.   GFR: CrCl cannot be calculated (Unknown ideal weight.).  Coagulation Profile: No results for input(s): INR, PROTIME in the last 168 hours.  Recent Results (from the past 240 hour(s))  Culture, blood (routine x 2)     Status: None (Preliminary result)   Collection Time: 05/23/20  2:00 PM   Specimen:  BLOOD RIGHT ARM  Result Value Ref Range Status   Specimen Description   Final    BLOOD RIGHT ARM Performed at [facility], [address], [place], [place] [address]    Special Requests   Final    BOTTLES DRAWN AEROBIC AND ANAEROBIC Blood Culture adequate volume Performed at [facility], [address], [place], [place] [address]    Culture   Final    NO GROWTH < 24 HOURS Performed at [facility], [address], [place], [place] [address]    Report Status PENDING  Incomplete  SARS Coronavirus 2 by RT PCR (hospital order, performed in [facility] hospital lab) Nasopharyngeal Nasopharyngeal Swab     Status: None   Collection Time: 05/23/20  2:24 PM   Specimen: Nasopharyngeal Swab  Result Value Ref Range Status   SARS Coronavirus 2 NEGATIVE NEGATIVE Final    Comment: (NOTE) SARS-CoV-2 target nucleic acids are NOT DETECTED.  The SARS-CoV-2 RNA is generally detectable in upper and lower respiratory specimens during the acute phase of infection. The lowest concentration of SARS-CoV-2 viral copies this assay can detect is 250 copies / mL. A negative result does not preclude SARS-CoV-2 infection and should not be used as the sole basis for treatment or other patient management decisions.  A negative result may occur with improper specimen collection / handling, submission of specimen other than nasopharyngeal swab, presence of viral mutation(s) within the areas targeted by this assay, and inadequate number of viral copies (<250 copies / mL). A negative result must be combined with clinical observations, patient history, and epidemiological information.  Fact Sheet for Patients:   BoilerBrush.com.cy  Fact Sheet for Healthcare Providers: https://pope.com/  This test is not yet approved or  cleared by the [place] FDA and has been authorized for detection and/or diagnosis of SARS-CoV-2 by FDA under  an Emergency Use Authorization (EUA).  This EUA will remain in effect (meaning this test can be used) for the duration of the COVID-19 declaration under Section 564(b)(1) of the Act, 21 U.S.C. section 360bbb-3(b)(1), unless the authorization is terminated or revoked sooner.  Performed at [facility], [address] [place], [place] [address]   Urine culture     Status: Abnormal   Collection Time: 05/23/20  4:40 PM   Specimen: Urine, Clean Catch  Result Value Ref Range Status   Specimen Description   Final    URINE, CLEAN CATCH Performed at [facility], [address], [place], [place] [address]    Special Requests   Final    NONE Performed at [facility], [address], [place], [place] [address]    Culture MULTIPLE SPECIES PRESENT, SUGGEST RECOLLECTION (A)  Final   Report Status 05/24/2020 FINAL  Final        Radiology Studies: [facility] 1 [address]  Result Date: 05/23/2020 CLINICAL DATA:  Hypoxia. EXAM: PORTABLE CHEST 1 VIEW COMPARISON:  CT 04/11/2020.  Chest x-ray 04/10/2020. FINDINGS: Mediastinum appears stable. Stable cardiomegaly and tortuosity of the thoracic aorta. Pulmonary venous congestion and mild increased bilateral interstitial prominence. Findings suggest CHF. Progression of right pleural effusion. Small new left pleural effusion. No pneumothorax. IMPRESSION: Cardiomegaly with pulmonary venous congestion mild increased bilateral interstitial prominence. Progressive right pleural effusion. Small new left pleural effusion. Findings suggest CHF. Electronically Signed   By: Dukes   On: 05/23/2020 14:37        Scheduled Meds: . apixaban  5 mg Oral BID  . atorvastatin  40 mg Oral Daily  . clopidogrel  75 mg Oral Daily  . irbesartan  300 mg Oral Daily  . metFORMIN  500 mg Oral Q breakfast  . Rotigotine  1 patch Topical QHS  . sodium chloride flush  3 mL Intravenous Q12H   Continuous  Infusions: . sodium chloride       LOS: 0 days     Cordelia Poche, MD [facility] 05/24/2020, 3:43 PM  If 7PM-7AM, please contact night-coverage [email or web address]

## 2020-05-24 NOTE — Evaluation (Addendum)
Physical Therapy Evaluation Patient Details Name: Cheryl Atkinson MRN: [id number] DOB: May 25, 1931 Today's Date: 05/24/2020   History of Present Illness  Patient is an 86 year old female with a history of atrial fibrillation, hypertension, COPD, CHF, prediabetes ,   Lap chole 04/05/2020 readmission 5/6 through 04/17/2020 lethargy confusion 2/2 opiate narcosis. DC to SNF, then home 05/11/20. Comes to ED 05/23/20 with HTN , SOB, acute exacerbation of CHF  Clinical Impression  The patient required extensive assistance  For bed mobility, Unable to attempt standing with only 1 therapist, patient with posterior leaning. Patient states that she wants to go home. Has a cousin who stays while son is at work. Patient recently [place] from SNF-6/10.Patient currently will require 24/7 caregivers at home until improves. Pt admitted with above diagnosis.  Pt currently with functional limitations due to the deficits listed below (see PT Problem List). Pt will benefit from skilled PT to increase their independence and safety with mobility to allow discharge to the venue listed below.     Patient has an abrasion on lower left leg that had bleed onto sheet. . RN notified    Follow Up Recommendations SNF;Home health PT;Supervision/Assistance - 24 hour    Equipment Recommendations  None recommended by PT    Recommendations for Other Services       Precautions / Restrictions Precautions Precautions: Fall      Mobility  Bed Mobility Overal bed mobility: Needs Assistance Bed Mobility: Rolling;Sidelying to Sit;Sit to Supine Rolling: Mod assist Sidelying to sit: Mod assist   Sit to supine: Min assist   General bed mobility comments: assist with trunk to sit  on bed edge, assist legs back into bed. Sat ion bed edge x 10 minutes  Transfers                 General transfer comment: attempted standing but unable to boost with only 1 assisting. tending to lean backwards.  Ambulation/Gait                 Stairs            Wheelchair Mobility    Modified Rankin (Stroke Patients Only)       Balance Overall balance assessment: Needs assistance;History of Falls Sitting-balance support: Bilateral upper extremity supported;Feet supported Sitting balance-Leahy Scale: Poor Sitting balance - Comments: frequent tactile cues to right selg Postural control: Posterior lean                                   Pertinent Vitals/Pain Pain Assessment: No/denies pain    Home Living Family/patient expects to be discharged to:: Private residence Living Arrangements: Children;Other relatives Available Help at Discharge: Family;Available PRN/intermittently Type of Home: House Home Access: Stairs to enter   Entrance Stairs-Number of Steps: 1   Home Equipment: [place] - 4 wheels;Cane - single point;Walker - 2 wheels Additional Comments: Pt lives with son and pt 76 cousin comes while son works. Has HHPT and OT since Dc from [facility] 05/11/20    Prior Function Level of Independence: Needs assistance   Gait / Transfers Assistance Needed: Pt states she uses rollator in house and onto driveway  ADL's / Homemaking Assistance Needed: Son and aide  Comments: Uses rollator for ambulation. Cousin, son assist with ADL's, dioes gettio BSC at night without assistance ususally.     Hand Dominance        Extremity/Trunk Assessment   Upper Extremity Assessment  Upper Extremity Assessment: Generalized weakness    Lower Extremity Assessment Lower Extremity Assessment: Generalized weakness    Cervical / Trunk Assessment Cervical / Trunk Assessment: Kyphotic  Communication      Cognition Arousal/Alertness: Awake/alert Behavior During Therapy: WFL for tasks assessed/performed Overall Cognitive Status: No family/caregiver present to determine baseline cognitive functioning                                 General Comments: orinted x 4, does  state that she wants to go home  inspite of heavy assistance required at present.      General Comments      Exercises     Assessment/Plan    PT Assessment Patient needs continued PT services  PT Problem List Decreased strength;Decreased balance;Decreased cognition;Decreased knowledge of precautions;Decreased range of motion;Decreased mobility;Decreased activity tolerance;Decreased safety awareness;Decreased skin integrity       PT Treatment Interventions DME instruction;Therapeutic activities;Gait training;Therapeutic exercise;Patient/family education;Functional mobility training    PT Goals (Current goals can be found in the Care Plan section)  Acute Rehab PT Goals Patient Stated Goal: to go home, not back to rehab PT Goal Formulation: With patient Time For Goal Achievement: 06/07/20 Potential to Achieve Goals: Fair    Frequency Min 3X/week   Barriers to discharge        Co-evaluation               AM-PAC PT "6 Clicks" Mobility  Outcome Measure Help needed turning from your back to your side while in a flat bed without using bedrails?: A Lot Help needed moving from lying on your back to sitting on the side of a flat bed without using bedrails?: A Lot Help needed moving to and from a bed to a chair (including a wheelchair)?: Total Help needed standing up from a chair using your arms (e.g., wheelchair or bedside chair)?: Total Help needed to walk in hospital room?: Total Help needed climbing 3-5 steps with a railing? : Total 6 Click Score: 8    End of Session   Activity Tolerance: Patient tolerated treatment well;Patient limited by fatigue Patient left: in bed Nurse Communication: Mobility status PT Visit Diagnosis: Unsteadiness on feet (R26.81)    Time: 8299-3716 PT Time Calculation (min) (ACUTE ONLY): 36 min   Charges:   PT Evaluation $PT Eval Moderate Complexity: 1 Mod PT Treatments $Therapeutic Activity: 8-22 mins        Blanchard Kelch PT Acute Rehabilitation Services Pager  [phone] Office [phone]   Rada Hay 05/24/2020, 11:09 AM

## 2020-05-25 ENCOUNTER — Inpatient Hospital Stay (HOSPITAL_COMMUNITY): Payer: Medicare Other

## 2020-05-25 LAB — BASIC METABOLIC PANEL
Anion gap: 11 (ref 5–15)
BUN: 23 mg/dL (ref 8–23)
CO2: 30 mmol/L (ref 22–32)
Calcium: 8.1 mg/dL — ABNORMAL LOW (ref 8.9–10.3)
Chloride: 99 mmol/L (ref 98–111)
Creatinine, Ser: 0.74 mg/dL (ref 0.44–1.00)
GFR calc Af Amer: >60 mL/min (ref >60)
GFR calc non Af Amer: >60 mL/min (ref >60)
Glucose, Bld: 110 mg/dL — ABNORMAL HIGH (ref 70–99)
Potassium: 3.3 mmol/L — ABNORMAL LOW (ref 3.5–5.1)
Sodium: 140 mmol/L (ref 135–145)

## 2020-05-25 MED ORDER — ADULT MULTIVITAMIN W/MINERALS CH
1.0000 | ORAL_TABLET | Freq: Every day | ORAL | Status: DC
  Administered 2020-05-25 – 2020-05-26 (×2): 1 via ORAL
  Filled 2020-05-25 (×2): qty 1

## 2020-05-25 MED ORDER — FUROSEMIDE 10 MG/ML IJ SOLN
40.0000 mg | Freq: Two times a day (BID) | INTRAMUSCULAR | Status: DC
  Administered 2020-05-25 – 2020-05-26 (×3): 40 mg via INTRAVENOUS
  Filled 2020-05-25 (×3): qty 4

## 2020-05-25 MED ORDER — ENSURE ENLIVE PO LIQD
237.0000 mL | Freq: Two times a day (BID) | ORAL | Status: DC
  Administered 2020-05-25 – 2020-05-26 (×3): 237 mL via ORAL

## 2020-05-25 NOTE — Progress Notes (Signed)
Assumed care of the pt from off going RN. Conditon stable. Cont with plan of care 

## 2020-05-25 NOTE — TOC Initial Note (Signed)
Transition of Care [facility]) - Initial/Assessment Note    Patient Details  Name: Cheryl Atkinson MRN: [id number] Date of Birth: [birth year]  Transition of Care [facility]) CM/SW Contact:    Dessa Phi, RN Phone Number: 05/25/2020, 3:32 PM  Clinical Narrative:  PT recc SNF-patient/son decine SNF, want home w/HHC-[facility] chosen HHPT/OT rep Santiago Glad aware. Has some dme already-3n1,transfer bench,rw. On 02 will monitor if needed @ home.                 Expected Discharge Plan: [place] Barriers to Discharge: Continued Medical Work up   Patient Goals and CMS Choice Patient states their goals for this hospitalization and ongoing recovery are:: go home CMS Medicare.gov Compare Post Acute Care list provided to:: Patient Represenative (must comment) [name] son)    Expected Discharge Plan and Services Expected Discharge Plan: [place]   Discharge Planning Services: CM Consult Post Acute Care Choice: [place] arrangements for the past 2 months: [place]: PT, OT [place] Agency: [place] ([place]) Date [place]: 05/25/20 Time [place]: [place] Representative spoke with at [place]: Santiago Glad  Prior Living Arrangements/Services Living arrangements for the past 2 months: [place] Lives with:: Adult Children Patient language and need for interpreter reviewed:: Yes Do you feel safe going back to the place where you live?: Yes      Need for Family Participation in Patient Care: No (Comment) Care giver support system in place?: Yes (comment)   Criminal Activity/Legal Involvement Pertinent to Current Situation/Hospitalization: No - Comment as needed  Activities of Daily Living Home Assistive Devices/Equipment: Eyeglasses, Cane (specify quad or straight), Walker (specify type), Bedside commode/3-in-1 ADL Screening (condition at time of admission) Patient's cognitive  ability adequate to safely complete daily activities?: Yes Is the patient deaf or have difficulty hearing?: No Does the patient have difficulty seeing, even when wearing glasses/contacts?: No Does the patient have difficulty concentrating, remembering, or making decisions?: No Patient able to express need for assistance with ADLs?: Yes Does the patient have difficulty dressing or bathing?: Yes Independently performs ADLs?: No Communication: Independent Dressing (OT): Needs assistance Is this a change from baseline?: Pre-admission baseline Grooming: Needs assistance Is this a change from baseline?: Pre-admission baseline Feeding: Independent Bathing: Needs assistance Is this a change from baseline?: Pre-admission baseline Toileting: Needs assistance Is this a change from baseline?: Pre-admission baseline In/Out Bed: Needs assistance Is this a change from baseline?: Pre-admission baseline Walks in Home: Needs assistance Is this a change from baseline?: Pre-admission baseline Does the patient have difficulty walking or climbing stairs?: Yes Weakness of Legs: Both Weakness of Arms/Hands: Both  Permission Sought/Granted Permission sought to share information with : Case Manager Permission granted to share information with : Yes, Verbal Permission Granted  Share Information with NAME: Case Manager  Permission granted to share info w AGENCY: [facility]  Permission granted to share info w Relationship: [name] son [phone]     Emotional Assessment Appearance:: Appears stated age Attitude/Demeanor/Rapport: Gracious Affect (typically observed): Accepting Orientation: : Oriented to Self, Oriented to Place, Oriented to  Time Alcohol / Substance Use: Not Applicable Psych Involvement: No (comment)  Admission diagnosis:  Acute respiratory failure with hypoxia (HCC) [J96.01] Heart failure ([place]) [I50.9] Abrasion of left lower extremity, initial encounter [Q22.297L]  Acute on chronic congestive  heart failure, unspecified heart failure type [facility]) [I50.9] Patient Active Problem List   Diagnosis Date Noted  . Acute on chronic diastolic CHF (congestive heart failure) (HCC) 05/24/2020  . Heart failure (HCC) 05/23/2020  . Postoperative hematoma involving digestive system following digestive system procedure 04/13/2020  . Encephalopathy 04/07/2020  . AKI (acute kidney injury) (HCC) 04/07/2020  . Acute respiratory failure with hypoxia (HCC) 04/07/2020  . Acute respiratory failure (HCC) 04/06/2020  . S/P laparoscopic cholecystectomy 04/04/2020  . CHF (congestive heart failure) (HCC) 03/01/2020  . Insomnia 03/01/2020  . Bacteremia 01/28/2020  . Acute cholangitis 01/25/2020  . Atrial fibrillation (HCC) 01/25/2020  . Adrenal nodule (HCC) 12/29/2019  . Abdominal aortic ectasia (HCC) 12/29/2019  . Major depression 03/25/2018  . Hypokalemia 12/21/2016  . Cat bite of right hand 12/21/2016  . BPPV (benign paroxysmal positional vertigo) 07/12/2016  . Memory loss 04/11/2016  . Mallet toe of right foot 10/20/2015  . Renal artery stenosis (HCC) 09/27/2015  . Hyperlipidemia 06/30/2015  . Claudication (HCC) 05/04/2015  . Atherosclerotic PVD with intermittent claudication (HCC) 04/26/2015  . Tinnitus 12/31/2014  . Former smoker 09/29/2014  . DNR (do not resuscitate) 09/29/2014  . Diabetes mellitus (HCC) 07/19/2014  . Multinodular goiter 08/30/2013  . Spinal stenosis of lumbar region at multiple levels 09/29/2012  . HIP PAIN, BILATERAL 07/16/2010  . COPD (chronic obstructive pulmonary disease) (HCC) 09/20/2009  . CONSTIPATION, CHRONIC 09/20/2009  . Iron deficiency anemia 11/09/2007  . History of UTI 11/09/2007  . RESTLESS LEG SYNDROME 09/25/2007  . Essential hypertension 09/25/2007  . GERD 09/25/2007  . LOW BACK PAIN 09/25/2007  . Osteoporosis 09/25/2007   PCP:  Shelva Majestic, MD Pharmacy:   [facility] - [address], [place] - [address] [place] [address] Phone: [phone] Fax: [phone]  [id number] [place], [place] - [address] AT [facility] [address] & [address] [place] [address] Phone: [phone] Fax: [phone]  [facility] - [place], [place] - [address] [place] [address] Phone: [phone] Fax: [phone]     Social Determinants of Health (SDOH) Interventions    Readmission Risk Interventions No flowsheet data found.

## 2020-05-25 NOTE — Progress Notes (Signed)
Initial Nutrition Assessment  DOCUMENTATION CODES:   Not applicable  INTERVENTION:  Ensure Enlive po BID, each supplement provides 350 kcal and 20 grams of protein (strawberry) MVI with minerals daily Encouraged po intake of meals and supplements  NUTRITION DIAGNOSIS:   Inadequate oral intake related to chronic illness, decreased appetite (COPD; CHF) as evidenced by per patient/family report, energy intake < 75% for > or equal to 1 month.  GOAL:   Patient will meet greater than or equal to 90% of their needs  MONITOR:   PO intake, Supplement acceptance, Weight trends, Labs, I & O's  REASON FOR ASSESSMENT:   Malnutrition Screening Tool    ASSESSMENT:  RD working remotely.  86 year old female with history of COPD, CHF, GERD, DM2, atrial fibrillation presented due to elevated BP, admitted for acute respiratory failure with hypoxia secondary to acute on chronic diastolic CHF.  Spoke with patient via phone this afternoon, reports doing well today. Patient reports her appetite is getting better, states that she ate a good breakfast and had ordered a baked potato with sour cream and iced tea for lunch. Per flowsheets, pt consumed 50% of breakfast meal today. Patient reports poor appetite and intake at home that has been going on for a while, recalls eating a few bites of what is put on her plate at meal times and drinking 1 strawberry Ensure daily. RD educated on small frequent meals verses larger meals and encouraged continued daily intake of nutrition supplement. Will order strawberry Ensure BID to aid with meeting needs.   Mild pitting BLE edema per RN assessment I/Os: -1947 ml since admit      -1347 ml x 24 hrs UOP: 1350 ml x 24 hrs  UBW 141 lb per pt report Current wt 139.26 lb Per chart, weights have been fairly stable over the past 4 months.   Medications reviewed and include: Lasix 40 mg IV twice, Metformin  Labs: K 3.3 (L), BNP 266.4 (H) Lab Results  Component Value  Date   HGBA1C 5.9 (H) 01/01/2020     NUTRITION - FOCUSED PHYSICAL EXAM: Unable to complete at this time, RD working remotely.  Diet Order:   Diet Order            Diet 2 gram sodium Room service appropriate? Yes; Fluid consistency: Thin  Diet effective now                 EDUCATION NEEDS:   Education needs have been addressed  Skin:  Skin Assessment: Reviewed RN Assessment  Last BM:  6/23  Height:   Ht Readings from Last 1 Encounters:  05/24/20 5\' 6"  (1.676 m)    Weight:   Wt Readings from Last 1 Encounters:  05/25/20 63.3 kg    BMI:  Body mass index is 22.52 kg/m.  Estimated Nutritional Needs:   Kcal:  1600-1800  Protein:  80-90  Fluid:  >/= 1.5 L/day   05/27/20, RD, LDN Clinical Nutrition After Hours/Weekend Pager # in Amion

## 2020-05-25 NOTE — Plan of Care (Signed)
  Problem: Clinical Measurements: Goal: Respiratory complications will improve Outcome: Progressing   Problem: Activity: Goal: Risk for activity intolerance will decrease Outcome: Not Progressing   Problem: Coping: Goal: Level of anxiety will decrease Outcome: Not Progressing

## 2020-05-25 NOTE — Progress Notes (Signed)
PROGRESS NOTE    Cheryl Atkinson  EXB:[id number] DOB: 23-Jan-1931 DOA: 05/23/2020 PCP: Marin Olp, MD   Brief Narrative: Cheryl Atkinson is a 86 y.o. female with a history of COPD, GERD, diabetes mellitus, type 2, paroxysmal atrial fibrillation on Eliquis. Patient presented secondary to elevated BP and found to have evidence of acute heart failure. Given IV lasix.   Assessment & Plan:   Active Problems:   COPD (chronic obstructive pulmonary disease) (HCC)   Atrial fibrillation (HCC)   Heart failure (HCC)   Acute on chronic diastolic CHF (congestive heart failure) (HCC)   Acute on chronic diastolic heart failure Managed with IV lasix on admission. No current symptoms. UOP of 700 mL over the last 24 hours. Chest x-ray significant for continued edema with effusions -Lasix 40 mg IV BID -BMP in AM -Daily weight/strict in out  Acute respiratory failure with hypoxia Secondary to above. Chest x-ray on 6/24 with evidence of consolidation secondary to volume loss vs possible infection. No clinical features of pneumonia at this time except hypoxia and mild leukocytosis -Wean to room air -2-view chest x-ray  History of subdiaphragmatic abscess Patient was on linezolid but course is completed by now.  COPD Stable -Continue albuterol prn  Diabetes mellitus, type 2 Last hemoglobin A1C of 5.9. On metformin as an outpatient. -Continue metformin  Paroxysmal atrial fibrillation -Continue Eliquis   DVT prophylaxis: Eliquis Code Status:   Code Status: DNR Family Communication: None at bedside Disposition Plan: Discharge home in 1-2 days once able to wean oxygen to room air. PT recommending SNF, however, patient declines; plan for [facility]   Consultants:   None  Procedures:   None  Antimicrobials:  None    Subjective: No dyspnea. No chest pain. No cough. Contnues to request discharge home.  Objective: Vitals:   05/24/20 2123 05/24/20 2324 05/25/20 0300 05/25/20  0604  BP: (!) 134/55  (!) 123/58 140/62  Pulse: 60  62 74  Resp: 20  18 (!) 22  Temp: (!) 97.5 F (36.4 C)  97.7 F (36.5 C) 98.1 F (36.7 C)  TempSrc: Oral  Oral Oral  SpO2: 98% 99% 100% 98%  Weight:    63.3 kg  Height:        Intake/Output Summary (Last 24 hours) at 05/25/2020 1014 Last data filed at 05/25/2020 0914 Gross per 24 hour  Intake 6 ml  Output 1350 ml  Net -1344 ml   Filed Weights   05/24/20 1259 05/25/20 0604  Weight: 62.7 kg 63.3 kg    Examination:  General exam: Appears calm and comfortable Respiratory system: Diminished. Respiratory effort normal. Cardiovascular system: S1 & S2 heard, RRR. No murmurs, rubs, gallops or clicks. Gastrointestinal system: Abdomen is nondistended, soft and nontender. No organomegaly or masses felt. Normal bowel sounds heard. Central nervous system: Alert and oriented. No focal neurological deficits. Musculoskeletal: 2+ BLE edema. No calf tenderness Skin: No cyanosis. No rashes Psychiatry: Judgement and insight appear normal. Mood & affect appropriate.     Data Reviewed: I have personally reviewed following labs and imaging studies  CBC Lab Results  Component Value Date   WBC 11.3 (H) 05/24/2020   RBC 3.53 (L) 05/24/2020   HGB 9.2 (L) 05/24/2020   HCT 30.6 (L) 05/24/2020   MCV 86.7 05/24/2020   MCH 26.1 05/24/2020   PLT 261 05/24/2020   MCHC 30.1 05/24/2020   RDW 16.9 (H) 05/24/2020   LYMPHSABS 0.9 05/23/2020   MONOABS 0.6 05/23/2020   EOSABS 0.0  05/23/2020   BASOSABS 0.0 05/23/2020     Last metabolic panel Lab Results  Component Value Date   NA 140 05/25/2020   K 3.3 (L) 05/25/2020   CL 99 05/25/2020   CO2 30 05/25/2020   BUN 23 05/25/2020   CREATININE 0.74 05/25/2020   GLUCOSE 110 (H) 05/25/2020   GFRNONAA >60 05/25/2020   GFRAA >60 05/25/2020   CALCIUM 8.1 (L) 05/25/2020   PROT 7.3 05/23/2020   ALBUMIN 3.4 (L) 05/23/2020   BILITOT 1.1 05/23/2020   ALKPHOS 110 05/23/2020   AST 20 05/23/2020   ALT  23 05/23/2020   ANIONGAP 11 05/25/2020    CBG (last 3)  No results for input(s): GLUCAP in the last 72 hours.   GFR: Estimated Creatinine Clearance: 47.3 mL/min (by C-G formula based on SCr of 0.74 mg/dL).  Coagulation Profile: No results for input(s): INR, PROTIME in the last 168 hours.  Recent Results (from the past 240 hour(s))  Culture, blood (routine x 2)     Status: None (Preliminary result)   Collection Time: 05/23/20  2:00 PM   Specimen: BLOOD RIGHT ARM  Result Value Ref Range Status   Specimen Description   Final    BLOOD RIGHT ARM Performed at [facility], [address], [place], [place] [address]    Special Requests   Final    BOTTLES DRAWN AEROBIC AND ANAEROBIC Blood Culture adequate volume Performed at [facility], [address] [place], [place] [address]    Culture   Final    NO GROWTH 2 DAYS Performed at [facility], [address] [place], [place] [address]    Report Status PENDING  Incomplete  SARS Coronavirus 2 by RT PCR (hospital order, performed in [facility] hospital lab) Nasopharyngeal Nasopharyngeal Swab     Status: None   Collection Time: 05/23/20  2:24 PM   Specimen: Nasopharyngeal Swab  Result Value Ref Range Status   SARS Coronavirus 2 NEGATIVE NEGATIVE Final    Comment: (NOTE) SARS-CoV-2 target nucleic acids are NOT DETECTED.  The SARS-CoV-2 RNA is generally detectable in upper and lower respiratory specimens during the acute phase of infection. The lowest concentration of SARS-CoV-2 viral copies this assay can detect is 250 copies / mL. A negative result does not preclude SARS-CoV-2 infection and should not be used as the sole basis for treatment or other patient management decisions.  A negative result may occur with improper specimen collection / handling, submission of specimen other than nasopharyngeal swab, presence of viral mutation(s) within the areas targeted by this assay, and inadequate number  of viral copies (<250 copies / mL). A negative result must be combined with clinical observations, patient history, and epidemiological information.  Fact Sheet for Patients:   BoilerBrush.com.cy  Fact Sheet for Healthcare Providers: https://pope.com/  This test is not yet approved or  cleared by the [place] FDA and has been authorized for detection and/or diagnosis of SARS-CoV-2 by FDA under an Emergency Use Authorization (EUA).  This EUA will remain in effect (meaning this test can be used) for the duration of the COVID-19 declaration under Section 564(b)(1) of the Act, 21 U.S.C. section 360bbb-3(b)(1), unless the authorization is terminated or revoked sooner.  Performed at [facility], [address] [place], [place] [address]   Urine culture     Status: Abnormal   Collection Time: 05/23/20  4:40 PM   Specimen: Urine, Clean Catch  Result Value Ref Range Status  Specimen Description   Final    URINE, CLEAN CATCH Performed at [facility], [address], [place], [place] [address]    Special Requests   Final    NONE Performed at [facility], [address] [place], [place] [address]    Culture MULTIPLE SPECIES PRESENT, SUGGEST RECOLLECTION (A)  Final   Report Status 05/24/2020 FINAL  Final        Radiology Studies: DG CHEST PORT 1 VIEW  Result Date: 05/25/2020 CLINICAL DATA:  Heart failure EXAM: PORTABLE CHEST 1 VIEW COMPARISON:  05/23/2020 FINDINGS: Heart size remains enlarged. Study limited by degree of rotation to the RIGHT on today's exam. Signs of RIGHT pleural effusion and basilar airspace disease. Increased interstitial markings and hilar fullness. Blunting of LEFT costodiaphragmatic sulcus. Visualized skeletal structures with without acute process on limited assessment. IMPRESSION: Findings of CHF with edema and effusions, RIGHT greater than LEFT.  Dense consolidation at the RIGHT lung base may represent worsening volume loss, difficult to exclude infection in this location. Electronically Signed   By: Donzetta Kohut M.D.   On: 05/25/2020 08:37   DG Chest Port 1 View  Result Date: 05/23/2020 CLINICAL DATA:  Hypoxia. EXAM: PORTABLE CHEST 1 VIEW COMPARISON:  CT 04/11/2020.  Chest x-ray 04/10/2020. FINDINGS: Mediastinum appears stable. Stable cardiomegaly and tortuosity of the thoracic aorta. Pulmonary venous congestion and mild increased bilateral interstitial prominence. Findings suggest CHF. Progression of right pleural effusion. Small new left pleural effusion. No pneumothorax. IMPRESSION: Cardiomegaly with pulmonary venous congestion mild increased bilateral interstitial prominence. Progressive right pleural effusion. Small new left pleural effusion. Findings suggest CHF. Electronically Signed   By: Maisie Fus  Register   On: 05/23/2020 14:37        Scheduled Meds: . apixaban  5 mg Oral BID  . atorvastatin  40 mg Oral Daily  . clopidogrel  75 mg Oral Daily  . furosemide  40 mg Intravenous BID  . irbesartan  300 mg Oral Daily  . metFORMIN  500 mg Oral Q breakfast  . Rotigotine  1 patch Topical QHS  . sodium chloride flush  3 mL Intravenous Q12H   Continuous Infusions: . sodium chloride       LOS: 1 day     Jacquelin Hawking, MD [facility] 05/25/2020, 10:14 AM  If 7PM-7AM, please contact night-coverage [email or web address]

## 2020-05-26 DIAGNOSIS — I509 Heart failure, unspecified: Secondary | ICD-10-CM

## 2020-05-26 DIAGNOSIS — J449 Chronic obstructive pulmonary disease, unspecified: Secondary | ICD-10-CM

## 2020-05-26 LAB — BASIC METABOLIC PANEL
Anion gap: 9 (ref 5–15)
BUN: 27 mg/dL — ABNORMAL HIGH (ref 8–23)
CO2: 34 mmol/L — ABNORMAL HIGH (ref 22–32)
Calcium: 8.3 mg/dL — ABNORMAL LOW (ref 8.9–10.3)
Chloride: 98 mmol/L (ref 98–111)
Creatinine, Ser: 0.58 mg/dL (ref 0.44–1.00)
GFR calc Af Amer: >60 mL/min (ref >60)
GFR calc non Af Amer: >60 mL/min (ref >60)
Glucose, Bld: 117 mg/dL — ABNORMAL HIGH (ref 70–99)
Potassium: 3.4 mmol/L — ABNORMAL LOW (ref 3.5–5.1)
Sodium: 141 mmol/L (ref 135–145)

## 2020-05-26 MED ORDER — POTASSIUM CHLORIDE CRYS ER 20 MEQ PO TBCR
30.0000 meq | EXTENDED_RELEASE_TABLET | Freq: Two times a day (BID) | ORAL | Status: DC
  Administered 2020-05-26: 30 meq via ORAL
  Filled 2020-05-26: qty 1

## 2020-05-26 MED ORDER — FUROSEMIDE 40 MG PO TABS: 40.0000 mg | ORAL_TABLET | Freq: Every day | ORAL | 0 refills | Status: DC

## 2020-05-26 MED ORDER — POTASSIUM CHLORIDE CRYS ER 20 MEQ PO TBCR: 20.0000 meq | EXTENDED_RELEASE_TABLET | Freq: Every day | ORAL | 0 refills | Status: DC

## 2020-05-26 NOTE — Discharge Instructions (Addendum)
Cheryl Atkinson,  You are in the hospital because of trouble breathing.  This was because of a heart failure exacerbation.  You were managed on IV Lasix to help clear excess fluid from your body.  Your breathing much better and currently on room air and not requiring oxygen.  I have recommended for you to adjust your Lasix dose at home.  I recommend for you to take Lasix 40 mg daily.  You need to be taking your potassium pills daily with your Lasix dose.  Please follow-up with your cardiologist.  You also need a repeat metabolic panel to check your potassium levels and kidney function within 1 week; you can get this from your primary care physician or your cardiologist.  Please place of every day and report weight gains of 3 to 5 pounds within a 24-hour to your cardiologist's office.

## 2020-05-26 NOTE — Discharge Summary (Signed)
Physician Discharge Summary  Cheryl Atkinson ZOX:[id number] DOB: 1931/08/21 DOA: 05/23/2020  PCP: Shelva Majestic, MD  Admit date: 05/23/2020 Discharge date: 05/26/2020  Admitted From: Home Disposition: Home  Recommendations for Outpatient Follow-up:  1. Follow up with PCP in 1 week 2. Follow up with cardiology as already scheduled 3. Please obtain BMP in one week 4. Chest x-ray in 3-4 weeks 5. Please follow up on the following pending results: None  Home Health: PT, OT, RN (patient declined SNF) Equipment/Devices: None  Discharge Condition: Stable CODE STATUS: DNR Diet recommendation: Heart healthy/sodium restricted/fluid restricted   Brief/Interim Summary:  Admission HPI written by Rhetta Mura, MD  Chief Complaint: Multiple  HPI:  86 year old white female Known COPD Reflux 1.4 cm left adrenal nodule Prediabetes Prior UTIs Prior R SFA chronic occlusion  paroxysmal A. Fib CHADS2 score >4 on Eliquis previously  Index admission 01/03/2020-had cholecystotomy drain 1/20 as she was a poor surgical candidate Lap chole 04/05/2020 readmission 5/6 through 04/17/2020 lethargy confusion 2/2 opiate narcosis CT chest showed subdiaphragmatic abscess-started on Zosyn antifungal general surgery recommended IR drain-Rx linezolid and sent to skilled facility She is apparently  since returned home and IR drain has been removed about a week prior  Endorses coming to PCP visit--Per the phone note she had blood pressure of 186 pulse of 104 bilateral edema and had not taken Lasix in a couple of days and was told to go to the ED-she was not seen in the office  Had a nose bleed ~ 2 days ago and was bleeding "real bad" Ann Maki tells me her cousin gives the meds to the patient--she wasn't taking lasix unclear reasons. Son doesn't know about whether she is on Antibiotics  In the ED kept n.p.o. urinalysis performed given Lasix x1 40 mg and although she required 2 L of oxygen on coming  into the ED-after given Lasix and being taken off of oxygen [place] satting in the 92-94 range which I suspect would be normal for a lady in her 74s    Hospital course:  Acute on chronic diastolic heart failure Managed with IV lasix on admission. No current symptoms. UOP of 700 mL over the last 24 hours. Chest x-ray significant for continued edema with effusions and patient was resumed on Lasix IV. Recommendations for Lasix 40 mg PO daily on discharge. Recommend cardiology follow-up.  Acute respiratory failure with hypoxia Secondary to above. Chest x-ray on 6/24 with evidence of consolidation secondary to volume loss vs possible infection. No clinical features of pneumonia at this time except hypoxia and mild leukocytosis. Weaned to room air.  History of subdiaphragmatic abscess Patient was on linezolid but course is completed by now.  COPD Stable. Continue albuterol prn  Diabetes mellitus, type 2 Last hemoglobin A1C of 5.9. On metformin as an outpatient. Continue metformin  Paroxysmal atrial fibrillation Continue Eliquis  Right side pleural effusion Moderate, however symptoms improved with lasix. Since patient is on room air, will avoid thoracentesis for now. If patient is unable to manage fluid with increased lasix dose, thoracentesis can be explored. MOST form indicates comfort measures with regard to medical interventions.  Abnormal chest x-ray Right lower lobe atelectasis vs infiltrate seen. Clinically, not consistent with pneumonia with improvement after diuresis. Will recommend incentive spirometer on discharge. Repeat chest x-ray in 3-4 weeks, or sooner if develops symptoms vs empiric treatment.  Discharge Diagnoses:  Principal Problem:   Acute on chronic diastolic CHF (congestive heart failure) (HCC) Active Problems:   COPD (chronic  obstructive pulmonary disease) (HCC)   Atrial fibrillation (HCC)   Acute respiratory failure with hypoxia (HCC)   Heart failure (HCC)    Pleural effusion due to CHF (congestive heart failure) [facility])    Discharge Instructions  Discharge Instructions    (HEART FAILURE PATIENTS) Call MD:  Anytime you have any of the following symptoms: 1) 3 pound weight gain in 24 hours or 5 pounds in 1 week 2) shortness of breath, with or without a dry hacking cough 3) swelling in the hands, feet or stomach 4) if you have to sleep on extra pillows at night in order to breathe.   Complete by: As directed    Call MD for:  difficulty breathing, headache or visual disturbances   Complete by: As directed    Diet - low sodium heart healthy   Complete by: As directed    Increase activity slowly   Complete by: As directed      Allergies as of 05/26/2020      Reactions   Codeine Other (See Comments)   REACTION: Syncope       Medication List    TAKE these medications   Albuterol Sulfate 108 (90 Base) MCG/ACT Aepb Commonly known as: ProAir RespiClick Inhale 2 puffs into the lungs every 6 (six) hours as needed (shortness of breath from COPD).   apixaban 5 MG Tabs tablet Commonly known as: Eliquis Take 1 tablet (5 mg total) by mouth 2 (two) times daily. Start 04/06/20   atorvastatin 40 MG tablet Commonly known as: LIPITOR Take 1 tablet (40 mg total) by mouth daily.   bisacodyl 10 MG suppository Commonly known as: DULCOLAX Place 1 suppository (10 mg total) rectally daily as needed for moderate constipation.   clopidogrel 75 MG tablet Commonly known as: PLAVIX Take 1 tablet (75 mg total) by mouth daily. Start 04/06/20   furosemide 40 MG tablet Commonly known as: LASIX Take 1 tablet (40 mg total) by mouth daily. What changed: when to take this   irbesartan 300 MG tablet Commonly known as: AVAPRO Take 1 tablet (300 mg total) by mouth daily.   metFORMIN 500 MG tablet Commonly known as: GLUCOPHAGE Take 500 mg by mouth daily.   Neupro 3 MG/24HR Pt24 Generic drug: Rotigotine PLACE 1 PATCH (3 MG) ONTO THE SKIN AT BEDTIME What changed:  See the new instructions.   polyethylene glycol 17 g packet Commonly known as: MIRALAX / GLYCOLAX Take 17 g by mouth 2 (two) times daily. Reported on 03/14/2016 What changed:   when to take this  reasons to take this  additional instructions   potassium chloride SA 20 MEQ tablet Commonly known as: KLOR-CON Take 1 tablet (20 mEq total) by mouth daily. Needs to be taken on days Lasix is taken What changed:   when to take this  reasons to take this  additional instructions   PRESERVISION AREDS 2 PO Take 1 tablet by mouth 2 (two) times daily.   sodium chloride flush 0.9 % Soln Commonly known as: NS 5 mLs by Intracatheter route every 8 (eight) hours.   traZODone 50 MG tablet Commonly known as: DESYREL Take 1.5 tablets (75 mg total) by mouth at bedtime as needed for sleep.       Follow-up Information    [facility].   Specialty: Home Health Services Why: [place] physical therapy/occupational therapy       Marin Olp, MD. Schedule an appointment as soon as possible for a visit in 1  week(s).   Specialty: Family Medicine Why: Hospital follow-up. Lab work (BMP) within one week. Contact information: [address] [place] [address] [phone]        Runell Gess, MD Follow up.   Specialties: Cardiology, Radiology Why: Follow up at already scheduled appointment. Lab work (BMP) needed within one week. Either from cardiologist or PCP. Contact information: [address] [place] [address] [phone]              Allergies  Allergen Reactions  . Codeine Other (See Comments)    REACTION: Syncope     Consultations:  None   Procedures/Studies: DG Chest 2 View  Result Date: 05/25/2020 CLINICAL DATA:  History of COPD. EXAM: CHEST - 2 VIEW COMPARISON:  May 25, 2020 (8:11 a.m.) FINDINGS: Stable moderate severity atelectasis and/or infiltrate is seen within the right lower lobe. There is a  stable, small to moderate size right pleural effusion. No pneumothorax is identified. The cardiac silhouette is moderately enlarged. A stable lobulated sclerotic area is seen within the proximal right humerus. Multilevel degenerative changes seen throughout the thoracic spine. IMPRESSION: 1. Small to moderate-sized right pleural effusion. 2. Stable right basilar consolidation. Electronically Signed   By: Aram Candela M.D.   On: 05/25/2020 15:18   CT ABDOMEN PELVIS W CONTRAST  Result Date: 05/16/2020 CLINICAL DATA:  86 year old female with perihepatic fluid collection, status post previous CT-guided drainage 04/12/2020. Prior surgery 04/04/2020 EXAM: CT ABDOMEN AND PELVIS WITH CONTRAST TECHNIQUE: Multidetector CT imaging of the abdomen and pelvis was performed using the standard protocol following bolus administration of intravenous contrast. CONTRAST:  ISOVUE-300 IOPAMIDOL (ISOVUE-300) INJECTION 61% COMPARISON:  04/26/2020, 04/12/2020 FINDINGS: Lower chest: Right-sided pleural effusion with atelectasis. Trace left-sided pleural fluid. Coronary artery calcifications. Hepatobiliary: Perihepatic fluid collection persists, with similar appearance of a lentiform fluid collection on the right capsule contributing to some mass effect on the adjacent liver parenchyma. There is perfusion anomaly with preferential enhancement in the liver parenchyma at this site. The pigtail drainage catheter no longer is within the fluid collection. Fluid collection measures 2.9 cm x 4.9 cm. The fluid is in communication through a small tract to the gallbladder fossa. Fluid within the gallbladder floss of persists measuring 3.9 cm by 1.8 cm. Rim enhancement is present about the dumbbell shaped collection. Surgical changes of cholecystectomy. The pigtail drainage catheter is on the skin, likely under a dressing. Pancreas: Atrophic pancreas, similar appearance to prior. No local inflammation. Spleen: Unremarkable spleen  Adrenals/Urinary Tract: - Right adrenal gland:  Unremarkable - Left adrenal gland: Unremarkable. - Right kidney: No hydronephrosis, nephrolithiasis, inflammation, or ureteral dilation. No focal lesion. - Left Kidney: No hydronephrosis, nephrolithiasis, inflammation, or ureteral dilation. Hypoenhancing/hypodense structure on the lateral cortex of left kidney is unchanged from the prior, likely benign cyst though incompletely characterized. - Urinary Bladder: Bladder partially distended. Diverticula at the right bladder base. Stomach/Bowel: - Stomach: Unremarkable. - Small bowel: Unremarkable - Appendix: Normal. - Colon: Unremarkable. Vascular/Lymphatic: Atherosclerotic calcifications of the abdominal aorta. Distal aorta measures 2.5 cm. Calcifications bilateral iliac arteries. Patent stent of the right common iliac artery. Proximal femoral arteries are patent with atherosclerosis. Unremarkable venous structures. Unremarkable portal structures. No periaortic fluid. Calcifications of the mesenteric arteries, which appear patent. Reproductive: Hysterectomy. Other: Mild edema within the mesenteric fat. No ascites. No additional fluid collection. Surgical changes along the midline abdomen. Musculoskeletal: No acute displaced fracture. Osteopenia. Multilevel degenerative changes of the visualized thoracolumbar spine. No bony canal narrowing. Mild degenerative  changes of the bilateral hips. IMPRESSION: The previous pigtail drainage catheter within the perihepatic fluid collection has been withdrawn from the body and terminates on the skin. Persisting rim enhancing dumbbell shaped fluid collection along the right aspect of the liver margin, communicating with gallbladder fossa collection. Right greater than left pleural fluid. Aortic Atherosclerosis (ICD10-I70.0). Additional ancillary findings as above. Electronically Signed   By: Gilmer MorJaime  Wagner D.O.   On: 05/16/2020 15:56   DG CHEST PORT 1 VIEW  Result Date:  05/25/2020 CLINICAL DATA:  Heart failure EXAM: PORTABLE CHEST 1 VIEW COMPARISON:  05/23/2020 FINDINGS: Heart size remains enlarged. Study limited by degree of rotation to the RIGHT on today's exam. Signs of RIGHT pleural effusion and basilar airspace disease. Increased interstitial markings and hilar fullness. Blunting of LEFT costodiaphragmatic sulcus. Visualized skeletal structures with without acute process on limited assessment. IMPRESSION: Findings of CHF with edema and effusions, RIGHT greater than LEFT. Dense consolidation at the RIGHT lung base may represent worsening volume loss, difficult to exclude infection in this location. Electronically Signed   By: Donzetta KohutGeoffrey  Wile M.D.   On: 05/25/2020 08:37   DG Chest Port 1 View  Result Date: 05/23/2020 CLINICAL DATA:  Hypoxia. EXAM: PORTABLE CHEST 1 VIEW COMPARISON:  CT 04/11/2020.  Chest x-ray 04/10/2020. FINDINGS: Mediastinum appears stable. Stable cardiomegaly and tortuosity of the thoracic aorta. Pulmonary venous congestion and mild increased bilateral interstitial prominence. Findings suggest CHF. Progression of right pleural effusion. Small new left pleural effusion. No pneumothorax. IMPRESSION: Cardiomegaly with pulmonary venous congestion mild increased bilateral interstitial prominence. Progressive right pleural effusion. Small new left pleural effusion. Findings suggest CHF. Electronically Signed   By: Maisie Fushomas  Register   On: 05/23/2020 14:37   IR Radiologist Eval & Mgmt  Result Date: 05/16/2020 CLINICAL DATA:  86 year old female with perihepatic fluid collection, status post previous CT-guided drainage 04/12/2020. Prior surgery 04/04/2020 EXAM: CT ABDOMEN AND PELVIS WITH CONTRAST TECHNIQUE: Multidetector CT imaging of the abdomen and pelvis was performed using the standard protocol following bolus administration of intravenous contrast. CONTRAST:  100mL ISOVUE-300 IOPAMIDOL (ISOVUE-300) INJECTION 61% COMPARISON:  04/26/2020, 04/12/2020 FINDINGS:  Lower chest: Right-sided pleural effusion with atelectasis. Trace left-sided pleural fluid. Coronary artery calcifications. Hepatobiliary: Perihepatic fluid collection persists, with similar appearance of a lentiform fluid collection on the right capsule contributing to some mass effect on the adjacent liver parenchyma. There is perfusion anomaly with preferential enhancement in the liver parenchyma at this site. The pigtail drainage catheter no longer is within the fluid collection. Fluid collection measures 2.9 cm x 4.9 cm. The fluid is in communication through a small tract to the gallbladder fossa. Fluid within the gallbladder floss of persists measuring 3.9 cm by 1.8 cm. Rim enhancement is present about the dumbbell shaped collection. Surgical changes of cholecystectomy. The pigtail drainage catheter is on the skin, likely under a dressing. Pancreas: Atrophic pancreas, similar appearance to prior. No local inflammation. Spleen: Unremarkable spleen Adrenals/Urinary Tract: - Right adrenal gland:  Unremarkable - Left adrenal gland: Unremarkable. - Right kidney: No hydronephrosis, nephrolithiasis, inflammation, or ureteral dilation. No focal lesion. - Left Kidney: No hydronephrosis, nephrolithiasis, inflammation, or ureteral dilation. Hypoenhancing/hypodense structure on the lateral cortex of left kidney is unchanged from the prior, likely benign cyst though incompletely characterized. - Urinary Bladder: Bladder partially distended. Diverticula at the right bladder base. Stomach/Bowel: - Stomach: Unremarkable. - Small bowel: Unremarkable - Appendix: Normal. - Colon: Unremarkable. Vascular/Lymphatic: Atherosclerotic calcifications of the abdominal aorta. Distal aorta measures 2.5 cm. Calcifications bilateral iliac arteries. Patent  stent of the right common iliac artery. Proximal femoral arteries are patent with atherosclerosis. Unremarkable venous structures. Unremarkable portal structures. No periaortic fluid.  Calcifications of the mesenteric arteries, which appear patent. Reproductive: Hysterectomy. Other: Mild edema within the mesenteric fat. No ascites. No additional fluid collection. Surgical changes along the midline abdomen. Musculoskeletal: No acute displaced fracture. Osteopenia. Multilevel degenerative changes of the visualized thoracolumbar spine. No bony canal narrowing. Mild degenerative changes of the bilateral hips. IMPRESSION: The previous pigtail drainage catheter within the perihepatic fluid collection has been withdrawn from the body and terminates on the skin. Persisting rim enhancing dumbbell shaped fluid collection along the right aspect of the liver margin, communicating with gallbladder fossa collection. Right greater than left pleural fluid. Aortic Atherosclerosis (ICD10-I70.0). Additional ancillary findings as above. Electronically Signed   By: Gilmer Mor D.O.   On: 05/16/2020 15:56      Subjective: No dyspnea or chest pain. No concerns today. Wants to go home.  Discharge Exam: Vitals:   05/26/20 0449 05/26/20 1132  BP: 131/64 130/67  Pulse: 72 73  Resp: 18 (!) 22  Temp: 98.5 F (36.9 C) 97.6 F (36.4 C)  SpO2: 98% 94%   Vitals:   05/25/20 1348 05/25/20 2036 05/26/20 0449 05/26/20 1132  BP: (!) 145/61 124/63 131/64 130/67  Pulse: 88 68 72 73  Resp: 20 18 18  (!) 22  Temp: 98 F (36.7 C) 97.6 F (36.4 C) 98.5 F (36.9 C) 97.6 F (36.4 C)  TempSrc: Oral Oral Oral Oral  SpO2: 99% 97% 98% 94%  Weight:      Height:        General: Pt is alert, awake, not in acute distress Cardiovascular: RRR, S1/S2 +, no rubs, no gallops Respiratory: Some mild rales, no wheezing, no rhonchi Abdominal: Soft, NT, ND, bowel sounds + Extremities: no edema, no cyanosis    The results of significant diagnostics from this hospitalization (including imaging, microbiology, ancillary and laboratory) are listed below for reference.     Microbiology: Recent Results (from the past  240 hour(s))  Culture, blood (routine x 2)     Status: None (Preliminary result)   Collection Time: 05/23/20  2:00 PM   Specimen: BLOOD RIGHT ARM  Result Value Ref Range Status   Specimen Description   Final    BLOOD RIGHT ARM Performed at [facility], [address] [place], [place]    Special Requests   Final    BOTTLES DRAWN AEROBIC AND ANAEROBIC Blood Culture adequate volume Performed at [facility], [address] [place], [place]    Culture   Final    NO GROWTH 2 DAYS Performed at [facility], [address], [place], [place]    Report Status PENDING  Incomplete  SARS Coronavirus 2 by RT PCR (hospital order, performed in [facility] hospital lab) Nasopharyngeal Nasopharyngeal Swab     Status: None   Collection Time: 05/23/20  2:24 PM   Specimen: Nasopharyngeal Swab  Result Value Ref Range Status   SARS Coronavirus 2 NEGATIVE NEGATIVE Final    Comment: (NOTE) SARS-CoV-2 target nucleic acids are NOT DETECTED.  The SARS-CoV-2 RNA is generally detectable in upper and lower respiratory specimens during the acute phase of infection. The lowest concentration of SARS-CoV-2 viral copies this assay can detect is 250 copies / mL. A negative result does not preclude SARS-CoV-2 infection and should not be used as the sole basis for treatment or other  patient management decisions.  A negative result may occur with improper specimen collection / handling, submission of specimen other than nasopharyngeal swab, presence of viral mutation(s) within the areas targeted by this assay, and inadequate number of viral copies (<250 copies / mL). A negative result must be combined with clinical observations, patient history, and epidemiological information.  Fact Sheet for Patients:   BoilerBrush.com.cy  Fact Sheet for Healthcare Providers: https://pope.com/  This test is not yet  approved or  cleared by the [place] FDA and has been authorized for detection and/or diagnosis of SARS-CoV-2 by FDA under an Emergency Use Authorization (EUA).  This EUA will remain in effect (meaning this test can be used) for the duration of the COVID-19 declaration under Section 564(b)(1) of the Act, 21 U.S.C. section 360bbb-3(b)(1), unless the authorization is terminated or revoked sooner.  Performed at [facility], [address], [place], [place] [address]   Urine culture     Status: Abnormal   Collection Time: 05/23/20  4:40 PM   Specimen: Urine, Clean Catch  Result Value Ref Range Status   Specimen Description   Final    URINE, CLEAN CATCH Performed at [facility], [address] [place], [place] [address]    Special Requests   Final    NONE Performed at [facility], [address], [place], [place] [address]    Culture MULTIPLE SPECIES PRESENT, SUGGEST RECOLLECTION (A)  Final   Report Status 05/24/2020 FINAL  Final     Labs: BNP (last 3 results) Recent Labs    05/23/20 1400  BNP 266.4*   Basic Metabolic Panel: Recent Labs  Lab 05/23/20 1400 05/24/20 0428 05/25/20 0529 05/26/20 0508  NA 140 139 140 141  K 3.5 3.1* 3.3* 3.4*  CL 100 101 99 98  CO2 28 29 30  34*  GLUCOSE 150* 121* 110* 117*  BUN 18 18 23  27*  CREATININE 0.53 0.57 0.74 0.58  CALCIUM 8.7* 8.2* 8.1* 8.3*   Liver Function Tests: Recent Labs  Lab 05/23/20 1400  AST 20  ALT 23  ALKPHOS 110  BILITOT 1.1  PROT 7.3  ALBUMIN 3.4*   Recent Labs  Lab 05/23/20 1400  LIPASE 20   No results for input(s): AMMONIA in the last 168 hours. CBC: Recent Labs  Lab 05/23/20 1400 05/24/20 1340  WBC 10.1 11.3*  NEUTROABS 8.5*  --   HGB 8.5* 9.2*  HCT 28.7* 30.6*  MCV 86.4 86.7  PLT 311 261   Cardiac Enzymes: No results for input(s): CKTOTAL, CKMB, CKMBINDEX, TROPONINI in the last 168 hours. BNP: Invalid input(s):  POCBNP CBG: No results for input(s): GLUCAP in the last 168 hours. D-Dimer No results for input(s): DDIMER in the last 72 hours. Hgb A1c No results for input(s): HGBA1C in the last 72 hours. Lipid Profile No results for input(s): CHOL, HDL, LDLCALC, TRIG, CHOLHDL, LDLDIRECT in the last 72 hours. Thyroid function studies No results for input(s): TSH, T4TOTAL, T3FREE, THYROIDAB in the last 72 hours.  Invalid input(s): FREET3 Anemia work up No results for input(s): VITAMINB12, FOLATE, FERRITIN, TIBC, IRON, RETICCTPCT in the last 72 hours. Urinalysis    Component Value Date/Time   COLORURINE YELLOW 05/23/2020 1640   APPEARANCEUR CLEAR 05/23/2020 1640   LABSPEC 1.014 05/23/2020 1640   PHURINE 6.0 05/23/2020 1640   GLUCOSEU NEGATIVE 05/23/2020 1640   HGBUR NEGATIVE 05/23/2020 1640   BILIRUBINUR NEGATIVE 05/23/2020 1640   BILIRUBINUR Negative 02/15/2020 1052   KETONESUR NEGATIVE  05/23/2020 1640   PROTEINUR >=300 (A) 05/23/2020 1640   UROBILINOGEN 0.2 02/15/2020 1052   UROBILINOGEN 0.2 08/20/2019 1732   NITRITE NEGATIVE 05/23/2020 1640   LEUKOCYTESUR NEGATIVE 05/23/2020 1640   Sepsis Labs Invalid input(s): PROCALCITONIN,  WBC,  LACTICIDVEN Microbiology Recent Results (from the past 240 hour(s))  Culture, blood (routine x 2)     Status: None (Preliminary result)   Collection Time: 05/23/20  2:00 PM   Specimen: BLOOD RIGHT ARM  Result Value Ref Range Status   Specimen Description   Final    BLOOD RIGHT ARM Performed at [facility], [address] [place], [place] [address]    Special Requests   Final    BOTTLES DRAWN AEROBIC AND ANAEROBIC Blood Culture adequate volume Performed at [facility], [address], [place], [place] [address]    Culture   Final    NO GROWTH 2 DAYS Performed at [facility], [address], [place], [place] [address]    Report Status PENDING  Incomplete  SARS Coronavirus 2 by RT PCR (hospital order, performed in  [facility] hospital lab) Nasopharyngeal Nasopharyngeal Swab     Status: None   Collection Time: 05/23/20  2:24 PM   Specimen: Nasopharyngeal Swab  Result Value Ref Range Status   SARS Coronavirus 2 NEGATIVE NEGATIVE Final    Comment: (NOTE) SARS-CoV-2 target nucleic acids are NOT DETECTED.  The SARS-CoV-2 RNA is generally detectable in upper and lower respiratory specimens during the acute phase of infection. The lowest concentration of SARS-CoV-2 viral copies this assay can detect is 250 copies / mL. A negative result does not preclude SARS-CoV-2 infection and should not be used as the sole basis for treatment or other patient management decisions.  A negative result may occur with improper specimen collection / handling, submission of specimen other than nasopharyngeal swab, presence of viral mutation(s) within the areas targeted by this assay, and inadequate number of viral copies (<250 copies / mL). A negative result must be combined with clinical observations, patient history, and epidemiological information.  Fact Sheet for Patients:   BoilerBrush.com.cy  Fact Sheet for Healthcare Providers: https://pope.com/  This test is not yet approved or  cleared by the [place] FDA and has been authorized for detection and/or diagnosis of SARS-CoV-2 by FDA under an Emergency Use Authorization (EUA).  This EUA will remain in effect (meaning this test can be used) for the duration of the COVID-19 declaration under Section 564(b)(1) of the Act, 21 U.S.C. section 360bbb-3(b)(1), unless the authorization is terminated or revoked sooner.  Performed at [facility], [address] [place], [place] [address]   Urine culture     Status: Abnormal   Collection Time: 05/23/20  4:40 PM   Specimen: Urine, Clean Catch  Result Value Ref Range Status   Specimen Description   Final    URINE, CLEAN CATCH Performed at [facility], [address] [place], [place] [address]    Special Requests   Final    NONE Performed at [facility], [address] [place], [place] [address]    Culture MULTIPLE SPECIES PRESENT, SUGGEST RECOLLECTION (A)  Final   Report Status 05/24/2020 FINAL  Final     Time coordinating discharge: 30 minutes  SIGNED:   Jacquelin Hawking, MD [facility] 05/26/2020, 12:59 PM

## 2020-05-26 NOTE — Progress Notes (Signed)
Patient and family member given discharge, follow up, and medication instructions, verbalized understanding, IV and telemetry monitor removed, personal belongings with patient, PTAR to transport patient home

## 2020-05-26 NOTE — TOC Transition Note (Signed)
Transition of Care [facility]) - CM/SW Discharge Note   Patient Details  Name: Cheryl Atkinson MRN: [id number] Date of Birth: Aug 15, 1931  [facility] of [facility]) CM/SW Contact:  Lanier Clam, RN Phone Number: 05/26/2020, 2:58 PM   Clinical Narrative: confirmed son Cheryl Atkinson chose [facility] whom patient was already active with for HHRN/PT/OT. PTAR for transport home-confirmed Cheryl Atkinson is home. No further CM needs.     Final next level of care: Home w Home Health Services Barriers to Discharge: No Barriers Identified   Patient Goals and CMS Choice Patient states their goals for this hospitalization and ongoing recovery are:: go home CMS Medicare.gov Compare Post Acute Care list provided to:: Patient Represenative (must comment) Cheryl Atkinson son)    Discharge Placement                       Discharge Plan and Services   Discharge Planning Services: CM Consult Post Acute Care Choice: Home Health                    HH Arranged: RN, PT, OT [facility] Agency: [facility] Date [facility] Agency Contacted: 05/25/20 Time HH Agency Contacted: 1531 Representative spoke with at [facility] Agency: Amy  Social Determinants of Health (SDOH) Interventions     Readmission Risk Interventions No flowsheet data found.

## 2020-05-26 NOTE — Progress Notes (Signed)
Pt discharged to home via PTAR.  Family notified that patient is on the way. Nino Parsley

## 2020-05-26 NOTE — Care Management Important Message (Signed)
Important Message  Patient Details IM Letter given to Lanier Clam RN Case Manager to present to the Patient Name: Cheryl Atkinson MRN: [id number] Date of Birth: 1931/02/22   Medicare Important Message Given:  Yes     Caren Macadam 05/26/2020, 12:09 PM

## 2020-05-26 NOTE — Plan of Care (Signed)

## 2020-05-26 NOTE — Progress Notes (Signed)
Report received from [facility], RN.  Assessment unchanged.  Awaiting transfer to home via PTAR. Nino Parsley

## 2020-05-26 NOTE — Progress Notes (Signed)
Physical Therapy Treatment Patient Details Name: Cheryl Atkinson MRN: [id number] DOB: 07-20-1931 Today's Date: 05/26/2020    History of Present Illness Patient is an 86 year old female with a history of atrial fibrillation, hypertension, COPD, CHF, prediabetes ,   Lap chole 04/05/2020 readmission 5/6 through 04/17/2020 lethargy confusion 2/2 opiate narcosis. DC to SNF, then home 05/11/20. Comes to ED 05/23/20 with HTN , SOB, acute exacerbation of CHF    PT Comments    Pt progressing slowly. Pt is profoundly weak and will need 24 hour assist at home. Max/total assist today for lateral/scooting transfer bed to chair. Will need PTAR transport home, her sister is transporting if she d/c's today, her son Is at work and would be unable to safely perform car to w/c transfers.   Follow Up Recommendations  SNF;Home health PT;Supervision/Assistance - 24 hour (family declines SNF)     Equipment Recommendations  None recommended by PT    Recommendations for Other Services       Precautions / Restrictions Precautions Precautions: Fall    Mobility  Bed Mobility Overal bed mobility: Needs Assistance Bed Mobility: Rolling;Supine to Sit Rolling: Mod assist   Supine to sit: Max assist     General bed mobility comments: assist with LEs and trunk, incr time needed  Transfers Overall transfer level: Needs assistance Equipment used: Rolling walker (2 wheeled) Transfers: Sit to/from Stand;Lateral/Scoot Transfers Sit to Stand: Max assist;Total assist        Lateral/Scoot Transfers: Max assist;Total assist General transfer comment: unable to come to full stand with 1 assist. lateral scooting transfer with  max to total assist, bed pad utilized to assist. pt with repeated posterior LOB  Ambulation/Gait                 Stairs             Wheelchair Mobility    Modified Rankin (Stroke Patients Only)       Balance Overall balance assessment: Needs assistance;History of Falls    Sitting balance-Leahy Scale: Poor Sitting balance - Comments: LOB to right and posteriorly. pt makes no attempt to self correct, requires multi-modal cues and assist to maintain midline Postural control: Right lateral lean;Posterior lean   Standing balance-Leahy Scale: Zero Standing balance comment: unable, posterior bias on attempt                             Cognition Arousal/Alertness: Awake/alert Behavior During Therapy: WFL for tasks assessed/performed Overall Cognitive Status: No family/caregiver present to determine baseline cognitive functioning                                 General Comments: orinted x 4, does  state that she wants to go home inspite of heavy assistance required at present.      Exercises      General Comments        Pertinent Vitals/Pain Pain Assessment: No/denies pain    Home Living                      Prior Function            PT Goals (current goals can now be found in the care plan section) Acute Rehab PT Goals Patient Stated Goal: to go home, not back to rehab PT Goal Formulation: With patient Time For Goal Achievement: 06/07/20 Potential to Achieve  Goals: Fair Progress towards PT goals: Progressing toward goals    Frequency    Min 3X/week      PT Plan Current plan remains appropriate    Co-evaluation              AM-PAC PT "6 Clicks" Mobility   Outcome Measure  Help needed turning from your back to your side while in a flat bed without using bedrails?: A Lot Help needed moving from lying on your back to sitting on the side of a flat bed without using bedrails?: A Lot Help needed moving to and from a bed to a chair (including a wheelchair)?: Total Help needed standing up from a chair using your arms (e.g., wheelchair or bedside chair)?: Total Help needed to walk in hospital room?: Total Help needed climbing 3-5 steps with a railing? : Total 6 Click Score: 8    End of Session    Activity Tolerance: Patient tolerated treatment well;Patient limited by fatigue Patient left: in chair;with call bell/phone within reach;with chair alarm set;with family/visitor present   PT Visit Diagnosis: Unsteadiness on feet (R26.81)     Time: 1583-0940 PT Time Calculation (min) (ACUTE ONLY): 24 min  Charges:  $Therapeutic Activity: 23-37 mins                     Delice Bison, PT  Acute Rehab Dept ([facility]) [phone] Pager [phone]  05/26/2020    [facility] 05/26/2020, 12:40 PM

## 2020-05-27 ENCOUNTER — Other Ambulatory Visit: Payer: Self-pay

## 2020-05-27 ENCOUNTER — Inpatient Hospital Stay (HOSPITAL_COMMUNITY)
Admission: EM | Admit: 2020-05-27 | Discharge: 2020-05-31 | DRG: 291 | Disposition: A | Discharge status: Home Health | Payer: Medicare Other | Attending: Internal Medicine | Admitting: Internal Medicine

## 2020-05-27 ENCOUNTER — Encounter (HOSPITAL_COMMUNITY): Payer: Self-pay | Admitting: Emergency Medicine

## 2020-05-27 ENCOUNTER — Emergency Department (HOSPITAL_COMMUNITY): Payer: Medicare Other

## 2020-05-27 DIAGNOSIS — J449 Chronic obstructive pulmonary disease, unspecified: Secondary | ICD-10-CM | POA: Diagnosis present

## 2020-05-27 DIAGNOSIS — Z90710 Acquired absence of both cervix and uterus: Secondary | ICD-10-CM

## 2020-05-27 DIAGNOSIS — Z66 Do not resuscitate: Secondary | ICD-10-CM | POA: Diagnosis present

## 2020-05-27 DIAGNOSIS — R0902 Hypoxemia: Secondary | ICD-10-CM | POA: Diagnosis not present

## 2020-05-27 DIAGNOSIS — Z888 Allergy status to other drugs, medicaments and biological substances status: Secondary | ICD-10-CM

## 2020-05-27 DIAGNOSIS — J918 Pleural effusion in other conditions classified elsewhere: Secondary | ICD-10-CM | POA: Diagnosis present

## 2020-05-27 DIAGNOSIS — Z7902 Long term (current) use of antithrombotics/antiplatelets: Secondary | ICD-10-CM

## 2020-05-27 DIAGNOSIS — Z7901 Long term (current) use of anticoagulants: Secondary | ICD-10-CM

## 2020-05-27 DIAGNOSIS — I5033 Acute on chronic diastolic (congestive) heart failure: Secondary | ICD-10-CM

## 2020-05-27 DIAGNOSIS — Z823 Family history of stroke: Secondary | ICD-10-CM

## 2020-05-27 DIAGNOSIS — I10 Essential (primary) hypertension: Secondary | ICD-10-CM

## 2020-05-27 DIAGNOSIS — R739 Hyperglycemia, unspecified: Secondary | ICD-10-CM

## 2020-05-27 DIAGNOSIS — G20 Parkinson's disease: Secondary | ICD-10-CM | POA: Diagnosis present

## 2020-05-27 DIAGNOSIS — R0602 Shortness of breath: Secondary | ICD-10-CM

## 2020-05-27 DIAGNOSIS — K219 Gastro-esophageal reflux disease without esophagitis: Secondary | ICD-10-CM | POA: Diagnosis present

## 2020-05-27 DIAGNOSIS — Z7984 Long term (current) use of oral hypoglycemic drugs: Secondary | ICD-10-CM

## 2020-05-27 DIAGNOSIS — L89620 Pressure ulcer of left heel, unstageable: Secondary | ICD-10-CM | POA: Diagnosis present

## 2020-05-27 DIAGNOSIS — I110 Hypertensive heart disease with heart failure: Principal | ICD-10-CM | POA: Diagnosis present

## 2020-05-27 DIAGNOSIS — Z993 Dependence on wheelchair: Secondary | ICD-10-CM

## 2020-05-27 DIAGNOSIS — Z87891 Personal history of nicotine dependence: Secondary | ICD-10-CM

## 2020-05-27 DIAGNOSIS — I480 Paroxysmal atrial fibrillation: Secondary | ICD-10-CM | POA: Diagnosis not present

## 2020-05-27 DIAGNOSIS — J9600 Acute respiratory failure, unspecified whether with hypoxia or hypercapnia: Secondary | ICD-10-CM | POA: Diagnosis present

## 2020-05-27 DIAGNOSIS — J90 Pleural effusion, not elsewhere classified: Secondary | ICD-10-CM

## 2020-05-27 DIAGNOSIS — Z8249 Family history of ischemic heart disease and other diseases of the circulatory system: Secondary | ICD-10-CM

## 2020-05-27 DIAGNOSIS — Z9889 Other specified postprocedural states: Secondary | ICD-10-CM

## 2020-05-27 DIAGNOSIS — Z20822 Contact with and (suspected) exposure to covid-19: Secondary | ICD-10-CM | POA: Diagnosis present

## 2020-05-27 DIAGNOSIS — E43 Unspecified severe protein-calorie malnutrition: Secondary | ICD-10-CM | POA: Insufficient documentation

## 2020-05-27 DIAGNOSIS — E785 Hyperlipidemia, unspecified: Secondary | ICD-10-CM | POA: Diagnosis present

## 2020-05-27 DIAGNOSIS — Z6821 Body mass index (BMI) 21.0-21.9, adult: Secondary | ICD-10-CM

## 2020-05-27 DIAGNOSIS — I509 Heart failure, unspecified: Secondary | ICD-10-CM

## 2020-05-27 DIAGNOSIS — M810 Age-related osteoporosis without current pathological fracture: Secondary | ICD-10-CM | POA: Diagnosis present

## 2020-05-27 DIAGNOSIS — E119 Type 2 diabetes mellitus without complications: Secondary | ICD-10-CM

## 2020-05-27 DIAGNOSIS — E1151 Type 2 diabetes mellitus with diabetic peripheral angiopathy without gangrene: Secondary | ICD-10-CM | POA: Diagnosis present

## 2020-05-27 DIAGNOSIS — J9601 Acute respiratory failure with hypoxia: Secondary | ICD-10-CM | POA: Diagnosis not present

## 2020-05-27 DIAGNOSIS — Z79899 Other long term (current) drug therapy: Secondary | ICD-10-CM

## 2020-05-27 DIAGNOSIS — Z9049 Acquired absence of other specified parts of digestive tract: Secondary | ICD-10-CM

## 2020-05-27 DIAGNOSIS — Z961 Presence of intraocular lens: Secondary | ICD-10-CM | POA: Diagnosis present

## 2020-05-27 DIAGNOSIS — D509 Iron deficiency anemia, unspecified: Secondary | ICD-10-CM | POA: Diagnosis present

## 2020-05-27 DIAGNOSIS — Z9841 Cataract extraction status, right eye: Secondary | ICD-10-CM

## 2020-05-27 DIAGNOSIS — Z9842 Cataract extraction status, left eye: Secondary | ICD-10-CM

## 2020-05-27 DIAGNOSIS — G2581 Restless legs syndrome: Secondary | ICD-10-CM | POA: Diagnosis present

## 2020-05-27 DIAGNOSIS — I4891 Unspecified atrial fibrillation: Secondary | ICD-10-CM | POA: Diagnosis present

## 2020-05-27 LAB — BRAIN NATRIURETIC PEPTIDE: B Natriuretic Peptide: 217.8 pg/mL — ABNORMAL HIGH (ref 0.0–100.0)

## 2020-05-27 LAB — CBC
HCT: 29.9 % — ABNORMAL LOW (ref 36.0–46.0)
Hemoglobin: 8.9 g/dL — ABNORMAL LOW (ref 12.0–15.0)
MCH: 25.2 pg — ABNORMAL LOW (ref 26.0–34.0)
MCHC: 29.8 g/dL — ABNORMAL LOW (ref 30.0–36.0)
MCV: 84.7 fL (ref 80.0–100.0)
Platelets: 264 10*3/uL (ref 150–400)
RBC: 3.53 MIL/uL — ABNORMAL LOW (ref 3.87–5.11)
RDW: 17.1 % — ABNORMAL HIGH (ref 11.5–15.5)
WBC: 9.3 10*3/uL (ref 4.0–10.5)
nRBC: 0 % (ref 0.0–0.2)

## 2020-05-27 LAB — BASIC METABOLIC PANEL
Anion gap: 11 (ref 5–15)
BUN: 20 mg/dL (ref 8–23)
CO2: 35 mmol/L — ABNORMAL HIGH (ref 22–32)
Calcium: 8.8 mg/dL — ABNORMAL LOW (ref 8.9–10.3)
Chloride: 95 mmol/L — ABNORMAL LOW (ref 98–111)
Creatinine, Ser: 0.62 mg/dL (ref 0.44–1.00)
GFR calc Af Amer: >60 mL/min (ref >60)
GFR calc non Af Amer: >60 mL/min (ref >60)
Glucose, Bld: 131 mg/dL — ABNORMAL HIGH (ref 70–99)
Potassium: 3.7 mmol/L (ref 3.5–5.1)
Sodium: 141 mmol/L (ref 135–145)

## 2020-05-27 LAB — HEPATIC FUNCTION PANEL
ALT: 19 U/L (ref 0–44)
AST: 18 U/L (ref 15–41)
Albumin: 3 g/dL — ABNORMAL LOW (ref 3.5–5.0)
Alkaline Phosphatase: 89 U/L (ref 38–126)
Bilirubin, Direct: 0.2 mg/dL (ref 0.0–0.2)
Indirect Bilirubin: 1 mg/dL — ABNORMAL HIGH (ref 0.3–0.9)
Total Bilirubin: 1.2 mg/dL (ref 0.3–1.2)
Total Protein: 6.7 g/dL (ref 6.5–8.1)

## 2020-05-27 LAB — PROCALCITONIN: Procalcitonin: <0.1 ng/mL

## 2020-05-27 LAB — SARS CORONAVIRUS 2 BY RT PCR (HOSPITAL ORDER, PERFORMED IN ~~LOC~~ HOSPITAL LAB): SARS Coronavirus 2: NEGATIVE

## 2020-05-27 LAB — LIPASE, BLOOD: Lipase: 23 U/L (ref 11–51)

## 2020-05-27 MED ORDER — IRBESARTAN 300 MG PO TABS: 300.0000 mg | ORAL_TABLET | Freq: Every day | ORAL | Status: DC

## 2020-05-27 MED ORDER — FUROSEMIDE 10 MG/ML IJ SOLN
40.0000 mg | Freq: Once | INTRAMUSCULAR | Status: AC
  Administered 2020-05-27: 40 mg via INTRAVENOUS
  Filled 2020-05-27: qty 4

## 2020-05-27 MED ORDER — TRAZODONE HCL 150 MG PO TABS: 75.0000 mg | ORAL_TABLET | Freq: Every evening | ORAL | Status: DC | PRN

## 2020-05-27 MED ORDER — ONDANSETRON HCL 4 MG/2ML IJ SOLN: 4.0000 mg | Freq: Four times a day (QID) | INTRAMUSCULAR | Status: DC | PRN

## 2020-05-27 MED ORDER — POTASSIUM CHLORIDE CRYS ER 20 MEQ PO TBCR
20.0000 meq | EXTENDED_RELEASE_TABLET | Freq: Every day | ORAL | Status: DC
  Administered 2020-05-27 – 2020-05-31 (×5): 20 meq via ORAL
  Filled 2020-05-27 (×5): qty 1

## 2020-05-27 MED ORDER — SODIUM CHLORIDE 0.9% FLUSH
3.0000 mL | Freq: Two times a day (BID) | INTRAVENOUS | Status: DC
  Administered 2020-05-28 – 2020-05-31 (×7): 3 mL via INTRAVENOUS

## 2020-05-27 MED ORDER — SODIUM CHLORIDE 0.9% FLUSH
3.0000 mL | Freq: Once | INTRAVENOUS | Status: AC
  Administered 2020-05-27: 3 mL via INTRAVENOUS

## 2020-05-27 MED ORDER — ATORVASTATIN CALCIUM 40 MG PO TABS: 40.0000 mg | ORAL_TABLET | Freq: Every day | ORAL | Status: DC

## 2020-05-27 MED ORDER — ALBUTEROL SULFATE (2.5 MG/3ML) 0.083% IN NEBU: 3.0000 mL | INHALATION_SOLUTION | Freq: Four times a day (QID) | RESPIRATORY_TRACT | Status: DC | PRN

## 2020-05-27 MED ORDER — ATORVASTATIN CALCIUM 40 MG PO TABS
40.0000 mg | ORAL_TABLET | Freq: Every day | ORAL | Status: DC
  Administered 2020-05-28 – 2020-05-31 (×4): 40 mg via ORAL
  Filled 2020-05-27 (×4): qty 1

## 2020-05-27 MED ORDER — ACETAMINOPHEN 325 MG PO TABS: 650.0000 mg | ORAL_TABLET | ORAL | Status: DC | PRN

## 2020-05-27 MED ORDER — METFORMIN HCL 500 MG PO TABS
500.0000 mg | ORAL_TABLET | Freq: Every day | ORAL | Status: DC
  Administered 2020-05-28 – 2020-05-31 (×4): 500 mg via ORAL
  Filled 2020-05-27 (×4): qty 1

## 2020-05-27 MED ORDER — IRBESARTAN 300 MG PO TABS
300.0000 mg | ORAL_TABLET | Freq: Every day | ORAL | Status: DC
  Administered 2020-05-28 – 2020-05-29 (×2): 300 mg via ORAL
  Filled 2020-05-27 (×2): qty 1

## 2020-05-27 MED ORDER — SODIUM CHLORIDE 0.9 % IV SOLN: 250.0000 mL | INTRAVENOUS | Status: DC | PRN

## 2020-05-27 MED ORDER — FUROSEMIDE 10 MG/ML IJ SOLN
40.0000 mg | Freq: Every day | INTRAMUSCULAR | Status: DC
  Administered 2020-05-28 – 2020-05-29 (×2): 40 mg via INTRAVENOUS
  Filled 2020-05-27 (×2): qty 4

## 2020-05-27 MED ORDER — SODIUM CHLORIDE 0.9% FLUSH
3.0000 mL | INTRAVENOUS | Status: DC | PRN
  Administered 2020-05-27: 10 mL via INTRAVENOUS

## 2020-05-27 MED ORDER — ROTIGOTINE 3 MG/24HR TD PT24
1.0000 | MEDICATED_PATCH | Freq: Every day | TRANSDERMAL | Status: DC
  Administered 2020-05-29 – 2020-05-30 (×2): 1 via TOPICAL
  Filled 2020-05-27 (×3): qty 1

## 2020-05-27 MED ORDER — BISACODYL 10 MG RE SUPP: 10.0000 mg | Freq: Every day | RECTAL | Status: DC | PRN

## 2020-05-27 MED ORDER — POLYETHYLENE GLYCOL 3350 17 G PO PACK: 17.0000 g | PACK | Freq: Every day | ORAL | Status: DC | PRN

## 2020-05-27 NOTE — ED Notes (Signed)
The pt was discharged from [facility] yesterday after being treated there for pmneumonia  No pain no sob at present a and o x 4  No sob at present  Nasal 02 decreased from 4 liters from the front  To 2 liters now sats 98 5  AF ON THE MONITOR   ??  normal

## 2020-05-27 NOTE — ED Triage Notes (Signed)
Pt discharged from [facility] yesterday.  Reports increased SOB today.  Nausea and diarrhea that started while in hospital continues.  Pt denies pain.  88% on Room air per EMS.  Doesn't wear home O2. Placed on 4 liters.   98%.

## 2020-05-27 NOTE — ED Provider Notes (Signed)
[facility] EMERGENCY DEPARTMENT Provider Note   CSN: [id number] Arrival date & time: 05/27/20  1425     History Chief Complaint  Patient presents with  . Shortness of Breath    Cheryl Atkinson is a 86 y.o. female.  The history is provided by the patient and medical records. No language interpreter was used.  Shortness of Breath Severity:  Severe Onset quality:  Gradual Duration:  2 days Timing:  Constant Progression:  Unchanged Chronicity:  Recurrent Context: not URI   Relieved by:  Nothing Worsened by:  Nothing Ineffective treatments:  None tried Associated symptoms: no abdominal pain, no chest pain, no cough, no diaphoresis, no fever, no headaches, no neck pain, no sputum production, no vomiting and no wheezing        Past Medical History:  Diagnosis Date  . Acute cholecystitis 12/29/2019  . Anemia   . Arthritis    "shoulders" (05/04/2015)  . Cellulitis of right lower extremity 11/27/2013  . CHF (congestive heart failure) (HCC)   . Chronic lower back pain   . Constipation   . COPD (chronic obstructive pulmonary disease) (HCC)   . GERD (gastroesophageal reflux disease)   . History of hiatal hernia   . HLD (hyperlipidemia)   . Hypertension   . Insomnia   . Osteoporosis   . Peripheral arterial disease (HCC)   . Restless leg syndrome   . Thyroid disease     Patient Active Problem List   Diagnosis Date Noted  . Pleural effusion due to CHF (congestive heart failure) (HCC) 05/26/2020  . Acute on chronic diastolic CHF (congestive heart failure) (HCC) 05/24/2020  . Heart failure (HCC) 05/23/2020  . Postoperative hematoma involving digestive system following digestive system procedure 04/13/2020  . Encephalopathy 04/07/2020  . AKI (acute kidney injury) (HCC) 04/07/2020  . Acute respiratory failure with hypoxia (HCC) 04/07/2020  . Acute respiratory failure (HCC) 04/06/2020  . S/P laparoscopic cholecystectomy 04/04/2020  . CHF (congestive heart  failure) (HCC) 03/01/2020  . Insomnia 03/01/2020  . Bacteremia 01/28/2020  . Acute cholangitis 01/25/2020  . Atrial fibrillation (HCC) 01/25/2020  . Adrenal nodule (HCC) 12/29/2019  . Abdominal aortic ectasia (HCC) 12/29/2019  . Major depression 03/25/2018  . Hypokalemia 12/21/2016  . Cat bite of right hand 12/21/2016  . BPPV (benign paroxysmal positional vertigo) 07/12/2016  . Memory loss 04/11/2016  . Mallet toe of right foot 10/20/2015  . Renal artery stenosis (HCC) 09/27/2015  . Hyperlipidemia 06/30/2015  . Claudication (HCC) 05/04/2015  . Atherosclerotic PVD with intermittent claudication (HCC) 04/26/2015  . Tinnitus 12/31/2014  . Former smoker 09/29/2014  . DNR (do not resuscitate) 09/29/2014  . Diabetes mellitus (HCC) 07/19/2014  . Multinodular goiter 08/30/2013  . Spinal stenosis of lumbar region at multiple levels 09/29/2012  . HIP PAIN, BILATERAL 07/16/2010  . COPD (chronic obstructive pulmonary disease) (HCC) 09/20/2009  . CONSTIPATION, CHRONIC 09/20/2009  . Iron deficiency anemia 11/09/2007  . History of UTI 11/09/2007  . RESTLESS LEG SYNDROME 09/25/2007  . Essential hypertension 09/25/2007  . GERD 09/25/2007  . LOW BACK PAIN 09/25/2007  . Osteoporosis 09/25/2007    Past Surgical History:  Procedure Laterality Date  . ABDOMINAL AORTAGRAM  05/04/2015   Procedure: Abdominal Aortagram;  Surgeon: Runell Gess, MD;  Location: [facility] INVASIVE CV LAB;  Service: Cardiovascular;;  . APPENDECTOMY    . CATARACT EXTRACTION W/ INTRAOCULAR LENS  IMPLANT, BILATERAL Bilateral   . CHOLECYSTECTOMY N/A 04/04/2020   Procedure: LAPAROSCOPIC CHOLECYSTECTOMY;  Surgeon: Violeta Gelinas,  MD;  Location: [place];  Service: General;  Laterality: N/A;  . I & D EXTREMITY Right 12/22/2016   Procedure: IRRIGATION AND DEBRIDEMENT EXTREMITY;  Surgeon: Milly Jakob, MD;  Location: [place];  Service: Orthopedics;  Laterality: Right;  . IR EXCHANGE BILIARY DRAIN  03/13/2020  . IR PATIENT EVAL TECH 0-60  MINS  12/30/2019  . IR PERC CHOLECYSTOSTOMY  01/27/2020  . IR RADIOLOGIST EVAL & MGMT  05/16/2020  . PERIPHERAL VASCULAR CATHETERIZATION N/A 05/04/2015   Procedure: Lower Extremity Angiography;  Surgeon: Lorretta Harp, MD;  Location: [place] CV LAB;  Service: Cardiovascular;  Laterality: N/A;  . PERIPHERAL VASCULAR CATHETERIZATION  05/04/2015   Procedure: Peripheral Vascular Intervention;  Surgeon: Lorretta Harp, MD;  Location: [place] CV LAB;  Service: Cardiovascular;;  RCIA - 7x22 ICAST  . PERIPHERAL VASCULAR CATHETERIZATION Right 09/04/2015   Procedure: Peripheral Vascular Atherectomy;  Surgeon: Lorretta Harp, MD;  Location: [place] CV LAB;  Service: Cardiovascular;  Laterality: Right;  SFA  . sfa Right 09/04/2015   de balloon  . THYROID SURGERY Right ?2013   "had goiter taken off my neck"  . TONSILLECTOMY    . VAGINAL HYSTERECTOMY       OB History   No obstetric history on file.     Family History  Problem Relation Age of Onset  . Heart disease Father   . Heart attack Father   . Heart disease Mother   . Coronary artery disease Other   . Atrial fibrillation Sister   . Stroke Maternal Grandmother   . Cancer Paternal Grandmother     Social History   Tobacco Use  . Smoking status: Former Smoker    Packs/day: 0.50    Years: 60.00    Pack years: 30.00    Types: Cigarettes    Quit date: 04/02/2015    Years since quitting: 5.1  . Smokeless tobacco: Never Used  Vaping Use  . Vaping Use: Never used  Substance Use Topics  . Alcohol use: No  . Drug use: No    Home Medications Prior to Admission medications   Medication Sig Start Date End Date Taking? Authorizing Provider  Albuterol Sulfate (PROAIR RESPICLICK) 476 (90 Base) MCG/ACT AEPB Inhale 2 puffs into the lungs every 6 (six) hours as needed (shortness of breath from COPD). 12/29/18   Marin Olp, MD  apixaban (ELIQUIS) 5 MG TABS tablet Take 1 tablet (5 mg total) by mouth 2 (two) times daily. Start  04/06/20 04/05/20   Georganna Skeans, MD  atorvastatin (LIPITOR) 40 MG tablet Take 1 tablet (40 mg total) by mouth daily. 03/16/20   Marin Olp, MD  bisacodyl (DULCOLAX) 10 MG suppository Place 1 suppository (10 mg total) rectally daily as needed for moderate constipation. 04/17/20   Hollice Gong, Mir Earlie Server, MD  clopidogrel (PLAVIX) 75 MG tablet Take 1 tablet (75 mg total) by mouth daily. Start 04/06/20 04/05/20   Georganna Skeans, MD  furosemide (LASIX) 40 MG tablet Take 1 tablet (40 mg total) by mouth daily. 05/26/20   Mariel Aloe, MD  irbesartan (AVAPRO) 300 MG tablet Take 1 tablet (300 mg total) by mouth daily. 03/25/20   Marin Olp, MD  metFORMIN (GLUCOPHAGE) 500 MG tablet Take 500 mg by mouth daily. 04/23/20   [provider]  Multiple Vitamins-Minerals (PRESERVISION AREDS 2 PO) Take 1 tablet by mouth 2 (two) times daily.    [provider]  NEUPRO 3 MG/24HR PT24 PLACE 1 PATCH (3 MG)  ONTO THE SKIN AT BEDTIME Patient taking differently: Apply 1 patch topically at bedtime. Take off in the morning 03/29/20   Shelva MajesticHunter, Stephen O, MD  polyethylene glycol (MIRALAX / GLYCOLAX) 17 g packet Take 17 g by mouth 2 (two) times daily. Reported on 03/14/2016 Patient taking differently: Take 17 g by mouth daily as needed for mild constipation.  01/03/20   Hongalgi, Maximino GreenlandAnand D, MD  potassium chloride SA (KLOR-CON) 20 MEQ tablet Take 1 tablet (20 mEq total) by mouth daily. Needs to be taken on days Lasix is taken 05/26/20   Narda BondsNettey, Ralph A, MD  sodium chloride flush (NS) 0.9 % SOLN 5 mLs by Intracatheter route every 8 (eight) hours. Patient not taking: Reported on 04/06/2020 01/03/20   Juliet RudeJohnson, Kelly R, PA-C  traZODone (DESYREL) 50 MG tablet Take 1.5 tablets (75 mg total) by mouth at bedtime as needed for sleep. 04/17/20   Colon BranchIkramullah, Mir Mohammed, MD    Allergies    Codeine  Review of Systems   Review of Systems  Constitutional: Positive for fatigue. Negative for chills, diaphoresis and fever.    HENT: Negative for congestion.   Respiratory: Positive for shortness of breath. Negative for cough, sputum production, chest tightness, wheezing and stridor.   Cardiovascular: Negative for chest pain and palpitations.  Gastrointestinal: Negative for abdominal pain, constipation, diarrhea, nausea and vomiting.  Genitourinary: Negative for dysuria.  Musculoskeletal: Negative for back pain, neck pain and neck stiffness.  Neurological: Negative for light-headedness and headaches.  Psychiatric/Behavioral: Negative for agitation.  All other systems reviewed and are negative.   Physical Exam Updated Vital Signs BP (!) 148/69 (BP Location: Left Arm)   Pulse 82   Temp 98.3 F (36.8 C) (Oral)   Resp 18   LMP  (LMP Unknown)   SpO2 100%   Physical Exam Vitals and nursing note reviewed.  Constitutional:      General: She is not in acute distress.    Appearance: She is not ill-appearing, toxic-appearing or diaphoretic.  HENT:     Head: Normocephalic.     Mouth/Throat:     Mouth: Mucous membranes are moist.     Pharynx: No pharyngeal swelling or oropharyngeal exudate.  Eyes:     Pupils: Pupils are equal, round, and reactive to light.  Cardiovascular:     Rate and Rhythm: Normal rate.     Heart sounds: Murmur heard.   Pulmonary:     Effort: Pulmonary effort is normal. No tachypnea.     Breath sounds: Rhonchi and rales present. No decreased breath sounds or wheezing.  Chest:     Chest wall: No tenderness.  Musculoskeletal:     Cervical back: Normal range of motion.     Right lower leg: No tenderness. No edema.     Left lower leg: No tenderness. No edema.  Skin:    Capillary Refill: Capillary refill takes less than 2 seconds.     Coloration: Skin is not pale.     Findings: No erythema.  Neurological:     General: No focal deficit present.     Mental Status: She is alert.  Psychiatric:        Mood and Affect: Mood normal.     ED Results / Procedures / Treatments   Labs (all  labs ordered are listed, but only abnormal results are displayed) Labs Reviewed  BASIC METABOLIC PANEL - Abnormal; Notable for the following components:      Result Value   Chloride 95 (*)  CO2 35 (*)    Glucose, Bld 131 (*)    Calcium 8.8 (*)    All other components within normal limits  CBC - Abnormal; Notable for the following components:   RBC 3.53 (*)    Hemoglobin 8.9 (*)    HCT 29.9 (*)    MCH 25.2 (*)    MCHC 29.8 (*)    RDW 17.1 (*)    All other components within normal limits  HEPATIC FUNCTION PANEL - Abnormal; Notable for the following components:   Albumin 3.0 (*)    Indirect Bilirubin 1.0 (*)    All other components within normal limits  BRAIN NATRIURETIC PEPTIDE - Abnormal; Notable for the following components:   B Natriuretic Peptide 217.8 (*)    All other components within normal limits  SARS CORONAVIRUS 2 BY RT PCR (HOSPITAL ORDER, PERFORMED IN [facility] LAB)  LIPASE, BLOOD  PROCALCITONIN  BASIC METABOLIC PANEL    EKG EKG Interpretation  Date/Time:  Saturday May 27 2020 14:28:48 EDT Ventricular Rate:  87 PR Interval:    QRS Duration: 78 QT Interval:  328 QTC Calculation: 394 R Axis:   -8 Text Interpretation: Atrial fibrillation Low voltage QRS Cannot rule out Anterior infarct , age undetermined Abnormal ECG When compared to prior, some T wave abnormalities now present. No STEMI Confirmed by Theda Belfast ([id number]) on 05/27/2020 3:07:21 PM   Radiology DG Chest 2 View  Result Date: 05/27/2020 CLINICAL DATA:  Increasing shortness of breath with nausea and diarrhea. Recent hospitalization. EXAM: CHEST - 2 VIEW COMPARISON:  Radiograph 05/25/2020 and 05/23/2020.  CT 04/11/2020. FINDINGS: Stable cardiomegaly, aortic atherosclerosis and ectasia. A moderate size right pleural effusion may be partially loculated posteriorly. There are associated right-greater-than-left basilar airspace opacities. Overall findings are similar to the most recent study.  No pneumothorax. Stable chondroid lesion in the proximal right humerus. IMPRESSION: Persistent right pleural effusion and right-greater-than-left basilar airspace opacities suspicious for pneumonia. No significant change from previous study of 2 days ago. Electronically Signed   By: Carey Bullocks M.D.   On: 05/27/2020 15:28    Procedures Procedures (including critical care time)  Medications Ordered in ED Medications  furosemide (LASIX) injection 40 mg (has no administration in time range)  bisacodyl (DULCOLAX) suppository 10 mg (has no administration in time range)  albuterol (PROVENTIL) (2.5 MG/3ML) 0.083% nebulizer solution 3 mL (has no administration in time range)  polyethylene glycol (MIRALAX / GLYCOLAX) packet 17 g (has no administration in time range)  potassium chloride SA (KLOR-CON) CR tablet 20 mEq (20 mEq Oral Given 05/27/20 2051)  traZODone (DESYREL) tablet 75 mg (has no administration in time range)  metFORMIN (GLUCOPHAGE) tablet 500 mg (has no administration in time range)  Rotigotine PT24 1 patch (has no administration in time range)  sodium chloride flush (NS) 0.9 % injection 3 mL (has no administration in time range)  sodium chloride flush (NS) 0.9 % injection 3 mL (10 mLs Intravenous Given 05/27/20 2100)  0.9 %  sodium chloride infusion (has no administration in time range)  acetaminophen (TYLENOL) tablet 650 mg (has no administration in time range)  ondansetron (ZOFRAN) injection 4 mg (has no administration in time range)  irbesartan (AVAPRO) tablet 300 mg (has no administration in time range)  atorvastatin (LIPITOR) tablet 40 mg (has no administration in time range)  sodium chloride flush (NS) 0.9 % injection 3 mL (3 mLs Intravenous Given 05/27/20 2117)  furosemide (LASIX) injection 40 mg (40 mg Intravenous Given 05/27/20 2059)  ED Course  I have reviewed the triage vital signs and the nursing notes.  Pertinent labs & imaging results that were available during my  care of the patient were reviewed by me and considered in my medical decision making (see chart for details).    MDM Rules/Calculators/A&P                          Cheryl Atkinson is a 86 y.o. female with a past medical history significant for COPD not on home oxygen, hypertension, diabetes, hyperlipidemia, BPPV, CHF, and recent subdiaphragmatic abscess status post drainage and completion of antibiotics who was discharged from the hospital yesterday for hypoxic respiratory failure related to fluid overload and pleural effusion who presents with continued shortness of breath, nausea, and diarrhea.  Patient reports today she is had worsening shortness of breath and has not been able to catch her breath.  She was found to have oxygen saturations in the mid 80s with EMS and she does not use oxygen at home.  Patient was placed on 4 L nasal cannula and brought in.  On 3 L, oxygen saturations are in the upper 90s now.  She reports her shortness of breath is improving.  She denies significant cough, fevers, or chills.  She denies chest pain or palpitations.  She does report the nausea but no vomiting.  She reports still having diarrhea at times.  No other abdominal pain flank pain or back pain.  Patient resting comfortably now on oxygen.  On exam, patient does have rales in rhonchi in both lungs.  Murmur appreciated.  Chest and abdomen nontender.  Legs have minimal edema.  She is moving all extremities.  She is resting comfortably without tachypnea on oxygen.  EKG was performed and shows some T wave changes but no STEMI.  As the patient is back on oxygen, anticipate she will require admission.  I reviewed the patient's discharge summary from yesterday and it says that if she becomes hypoxic and, she would likely need readmission and thoracentesis.  The patient had a chest x-ray done here which shows similar effusions and edema as well as areas possible for pneumonia.  As patient is denying fevers, chills,  cough, and her initial white blood cell count is normal, will hold on antibiotics.  Suspect is more fluid related.  Will wait for other labs to return and Covid test.  Anticipate admission for further management of hypoxic respiratory failure.  Coronavirus test is negative.  Metabolic panel shows no evidence of AKI.  No leukocytosis.  Suspect patient is having pleural effusion causing her hypoxia.  Given the recent discharge yesterday, will call the hospitalist team for admission again.   Final Clinical Impression(s) / ED Diagnoses Final diagnoses:  Hypoxia  SOB (shortness of breath)  Pleural effusion     Clinical Impression: 1. Hypoxia   2. SOB (shortness of breath)   3. Pleural effusion     Disposition: Admit  This note was prepared with assistance of Dragon voice recognition software. Occasional wrong-word or sound-a-like substitutions may have occurred due to the inherent limitations of voice recognition software.     Makynleigh Breslin, Canary Brim, MD 05/28/20 0030

## 2020-05-27 NOTE — Plan of Care (Signed)
  Problem: Pain Managment: Goal: General experience of comfort will improve Outcome: Progressing   Problem: Skin Integrity: Goal: Risk for impaired skin integrity will decrease Outcome: Progressing   

## 2020-05-27 NOTE — H&P (Signed)
History and Physical    Cheryl Atkinson VZC:[id number] DOB: 04-19-1931 DOA: 05/27/2020  PCP: Shelva Majestic, MD  Patient coming from: Home  I have personally briefly reviewed patient's old medical records in [facility]  Chief Complaint: SOB  HPI: Cheryl Atkinson is a 86 y.o. female with medical history significant of COPD, dCHF, HTN, prediabetes, A.Fib on eliquis, also on plavix too.  12/28/19 - cholangitis admit, cholecystostomy drain 5/5 - lap chole 5/6-5/17 readmit for lethargy and confusion due to opiate narcosis and subdiaphragmatic abscess, put on zosyn, LZD, and ? Antifungal? Sent to SNF.  Completed ABx at SNF  Had nose bleed at SNF and didn't take lasix for a couple of days.  Admitted 6/22 with resp failure, R pleural effusion, apparent CHF.  Improved with Lasix, no tap done though Dr. Mal Misty noted that if she got worse again to do tap on his DC summary yesterday.  Pt returns to ED today with c/o SOB.  Satting 88% on RA, improved on 2L.  No fever, no productive cough, no URI symptoms.    ED Course: No SIRS, BNP 217.8.  CXR again shows B opacities and R pleural effusion.  HGB 8.9 (baseline).  EKG - A.Fib  No SIRS   Review of Systems: As per HPI, otherwise all review of systems negative.  Past Medical History:  Diagnosis Date  . Acute cholecystitis 12/29/2019  . Anemia   . Arthritis    "shoulders" (05/04/2015)  . Cellulitis of right lower extremity 11/27/2013  . CHF (congestive heart failure) (HCC)   . Chronic lower back pain   . Constipation   . COPD (chronic obstructive pulmonary disease) (HCC)   . GERD (gastroesophageal reflux disease)   . History of hiatal hernia   . HLD (hyperlipidemia)   . Hypertension   . Insomnia   . Osteoporosis   . Peripheral arterial disease (HCC)   . Restless leg syndrome   . Thyroid disease     Past Surgical History:  Procedure Laterality Date  . ABDOMINAL AORTAGRAM  05/04/2015   Procedure: Abdominal Aortagram;   Surgeon: Runell Gess, MD;  Location: [facility] INVASIVE CV LAB;  Service: Cardiovascular;;  . APPENDECTOMY    . CATARACT EXTRACTION W/ INTRAOCULAR LENS  IMPLANT, BILATERAL Bilateral   . CHOLECYSTECTOMY N/A 04/04/2020   Procedure: LAPAROSCOPIC CHOLECYSTECTOMY;  Surgeon: Violeta Gelinas, MD;  Location: [facility] OR;  Service: General;  Laterality: N/A;  . I & D EXTREMITY Right 12/22/2016   Procedure: IRRIGATION AND DEBRIDEMENT EXTREMITY;  Surgeon: Mack Hook, MD;  Location: [facility] OR;  Service: Orthopedics;  Laterality: Right;  . IR EXCHANGE BILIARY DRAIN  03/13/2020  . IR PATIENT EVAL TECH 0-60 MINS  12/30/2019  . IR PERC CHOLECYSTOSTOMY  01/27/2020  . IR RADIOLOGIST EVAL & MGMT  05/16/2020  . PERIPHERAL VASCULAR CATHETERIZATION N/A 05/04/2015   Procedure: Lower Extremity Angiography;  Surgeon: Runell Gess, MD;  Location: [facility] INVASIVE CV LAB;  Service: Cardiovascular;  Laterality: N/A;  . PERIPHERAL VASCULAR CATHETERIZATION  05/04/2015   Procedure: Peripheral Vascular Intervention;  Surgeon: Runell Gess, MD;  Location: [facility] INVASIVE CV LAB;  Service: Cardiovascular;;  RCIA - 7x22 ICAST  . PERIPHERAL VASCULAR CATHETERIZATION Right 09/04/2015   Procedure: Peripheral Vascular Atherectomy;  Surgeon: Runell Gess, MD;  Location: [facility] INVASIVE CV LAB;  Service: Cardiovascular;  Laterality: Right;  SFA  . sfa Right 09/04/2015   de balloon  . THYROID SURGERY Right ?2013   "had goiter taken off my  neck"  . TONSILLECTOMY    . VAGINAL HYSTERECTOMY       reports that she quit smoking about 5 years ago. Her smoking use included cigarettes. She has a 30.00 pack-year smoking history. She has never used smokeless tobacco. She reports that she does not drink alcohol and does not use drugs.  Allergies  Allergen Reactions  . Codeine Other (See Comments)    Syncope     Family History  Problem Relation Age of Onset  . Heart disease Father   . Heart attack Father   . Heart disease Mother   . Coronary artery  disease Other   . Atrial fibrillation Sister   . Stroke Maternal Grandmother   . Cancer Paternal Grandmother      Prior to Admission medications   Medication Sig Start Date End Date Taking? Authorizing Provider  clopidogrel (PLAVIX) 75 MG tablet Take 1 tablet (75 mg total) by mouth daily. Start 04/06/20 04/05/20  Yes Georganna Skeans, MD  furosemide (LASIX) 40 MG tablet Take 1 tablet (40 mg total) by mouth daily. 05/26/20  Yes Mariel Aloe, MD  Albuterol Sulfate (PROAIR RESPICLICK) 503 (90 Base) MCG/ACT AEPB Inhale 2 puffs into the lungs every 6 (six) hours as needed (shortness of breath from COPD). 12/29/18   Marin Olp, MD  apixaban (ELIQUIS) 5 MG TABS tablet Take 1 tablet (5 mg total) by mouth 2 (two) times daily. Start 04/06/20 04/05/20   Georganna Skeans, MD  atorvastatin (LIPITOR) 40 MG tablet Take 1 tablet (40 mg total) by mouth daily. 03/16/20   Marin Olp, MD  bisacodyl (DULCOLAX) 10 MG suppository Place 1 suppository (10 mg total) rectally daily as needed for moderate constipation. 04/17/20   Hollice Gong, Mir Mohammed, MD  irbesartan (AVAPRO) 300 MG tablet Take 1 tablet (300 mg total) by mouth daily. 03/25/20   Marin Olp, MD  metFORMIN (GLUCOPHAGE) 500 MG tablet Take 500 mg by mouth daily. 04/23/20   [provider]  Multiple Vitamins-Minerals (PRESERVISION AREDS 2 PO) Take 1 tablet by mouth 2 (two) times daily.    [provider]  NEUPRO 3 MG/24HR PT24 PLACE 1 PATCH (3 MG) ONTO THE SKIN AT BEDTIME Patient taking differently: Apply 1 patch topically at bedtime. Take off in the morning 03/29/20   Marin Olp, MD  polyethylene glycol (MIRALAX / GLYCOLAX) 17 g packet Take 17 g by mouth 2 (two) times daily. Reported on 03/14/2016 Patient taking differently: Take 17 g by mouth daily as needed for mild constipation.  01/03/20   Hongalgi, Lenis Dickinson, MD  potassium chloride SA (KLOR-CON) 20 MEQ tablet Take 1 tablet (20 mEq total) by mouth daily. Needs to be taken on  days Lasix is taken 05/26/20   Mariel Aloe, MD  sodium chloride flush (NS) 0.9 % SOLN 5 mLs by Intracatheter route every 8 (eight) hours. Patient not taking: Reported on 04/06/2020 01/03/20   Norm Parcel, PA-C  traZODone (DESYREL) 50 MG tablet Take 1.5 tablets (75 mg total) by mouth at bedtime as needed for sleep. 04/17/20   Tomma Rakers, MD    Physical Exam: Vitals:   05/27/20 1430 05/27/20 1543 05/27/20 1704  BP: (!) 148/69 133/64   Pulse: 82 68   Resp: 18 18   Temp: 98.3 F (36.8 C) 98.2 F (36.8 C)   TempSrc: Oral Oral   SpO2: 100% 98%   Weight:   63.3 kg  Height:   5\' 6"  (1.676 m)  Constitutional: NAD, calm, comfortable Eyes: PERRL, lids and conjunctivae normal ENMT: Mucous membranes are moist. Posterior pharynx clear of any exudate or lesions.Normal dentition.  Neck: normal, supple, no masses, no thyromegaly Respiratory: B rhonchi Cardiovascular: Regular rate and rhythm, no murmurs / rubs / gallops. No extremity edema. 2+ pedal pulses. No carotid bruits.  Abdomen: no tenderness, no masses palpated. No hepatosplenomegaly. Bowel sounds positive.  Musculoskeletal: no clubbing / cyanosis. No joint deformity upper and lower extremities. Good ROM, no contractures. Normal muscle tone.  Skin: no rashes, lesions, ulcers. No induration Neurologic: CN 2-12 grossly intact. Sensation intact, DTR normal. Strength 5/5 in all 4.  Psychiatric: Normal judgment and insight. Alert and oriented x 3. Normal mood.    Labs on Admission: I have personally reviewed following labs and imaging studies  CBC: Recent Labs  Lab 05/23/20 1400 05/24/20 1340 05/27/20 1443  WBC 10.1 11.3* 9.3  NEUTROABS 8.5*  --   --   HGB 8.5* 9.2* 8.9*  HCT 28.7* 30.6* 29.9*  MCV 86.4 86.7 84.7  PLT 311 261 264   Basic Metabolic Panel: Recent Labs  Lab 05/23/20 1400 05/24/20 0428 05/25/20 0529 05/26/20 0508 05/27/20 1443  NA 140 139 140 141 141  K 3.5 3.1* 3.3* 3.4* 3.7  CL 100 101 99  98 95*  CO2 28 29 30  34* 35*  GLUCOSE 150* 121* 110* 117* 131*  BUN 18 18 23  27* 20  CREATININE 0.53 0.57 0.74 0.58 0.62  CALCIUM 8.7* 8.2* 8.1* 8.3* 8.8*   GFR: Estimated Creatinine Clearance: 47.3 mL/min (by C-G formula based on SCr of 0.62 mg/dL). Liver Function Tests: Recent Labs  Lab 05/23/20 1400 05/27/20 1443  AST 20 18  ALT 23 19  ALKPHOS 110 89  BILITOT 1.1 1.2  PROT 7.3 6.7  ALBUMIN 3.4* 3.0*   Recent Labs  Lab 05/23/20 1400 05/27/20 1443  LIPASE 20 23   No results for input(s): AMMONIA in the last 168 hours. Coagulation Profile: No results for input(s): INR, PROTIME in the last 168 hours. Cardiac Enzymes: No results for input(s): CKTOTAL, CKMB, CKMBINDEX, TROPONINI in the last 168 hours. BNP (last 3 results) Recent Labs    02/11/20 0936  PROBNP 228.0*   HbA1C: No results for input(s): HGBA1C in the last 72 hours. CBG: No results for input(s): GLUCAP in the last 168 hours. Lipid Profile: No results for input(s): CHOL, HDL, LDLCALC, TRIG, CHOLHDL, LDLDIRECT in the last 72 hours. Thyroid Function Tests: No results for input(s): TSH, T4TOTAL, FREET4, T3FREE, THYROIDAB in the last 72 hours. Anemia Panel: No results for input(s): VITAMINB12, FOLATE, FERRITIN, TIBC, IRON, RETICCTPCT in the last 72 hours. Urine analysis:    Component Value Date/Time   COLORURINE YELLOW 05/23/2020 1640   APPEARANCEUR CLEAR 05/23/2020 1640   LABSPEC 1.014 05/23/2020 1640   PHURINE 6.0 05/23/2020 1640   GLUCOSEU NEGATIVE 05/23/2020 1640   HGBUR NEGATIVE 05/23/2020 1640   BILIRUBINUR NEGATIVE 05/23/2020 1640   BILIRUBINUR Negative 02/15/2020 1052   KETONESUR NEGATIVE 05/23/2020 1640   PROTEINUR >=300 (A) 05/23/2020 1640   UROBILINOGEN 0.2 02/15/2020 1052   UROBILINOGEN 0.2 08/20/2019 1732   NITRITE NEGATIVE 05/23/2020 1640   LEUKOCYTESUR NEGATIVE 05/23/2020 1640    Radiological Exams on Admission: DG Chest 2 View  Result Date: 05/27/2020 CLINICAL DATA:  Increasing  shortness of breath with nausea and diarrhea. Recent hospitalization. EXAM: CHEST - 2 VIEW COMPARISON:  Radiograph 05/25/2020 and 05/23/2020.  CT 04/11/2020. FINDINGS: Stable cardiomegaly, aortic atherosclerosis and ectasia. A moderate size  right pleural effusion may be partially loculated posteriorly. There are associated right-greater-than-left basilar airspace opacities. Overall findings are similar to the most recent study. No pneumothorax. Stable chondroid lesion in the proximal right humerus. IMPRESSION: Persistent right pleural effusion and right-greater-than-left basilar airspace opacities suspicious for pneumonia. No significant change from previous study of 2 days ago. Electronically Signed   By: Carey Bullocks M.D.   On: 05/27/2020 15:28    EKG: Independently reviewed.  Assessment/Plan Principal Problem:   Acute respiratory failure with hypoxia (HCC) Active Problems:   Essential hypertension   COPD (chronic obstructive pulmonary disease) (HCC)   Diabetes mellitus (HCC)   Atrial fibrillation (HCC)   Acute on chronic diastolic CHF (congestive heart failure) (HCC)   Pleural effusion due to CHF (congestive heart failure) (HCC)    1. Acute respiratory failure with hypoxia - 1. CHF suspected over PNA given lack of any SIRS, elevated BNP, improvement over past few days in hospital with lasix. 2. Checking procalcitonin 2. Acute on chronic diastolic CHF - 1. CHF pathway 2. Lasix 40mg  IV now and 40mg  IV daily 3. Strict intake and output 4. Daily BMP 5. Cont home potassium 6. Will get an updated 2d echo since this pt second admission this week for CHF to see if any changes since April 3. R pleural effusion - 1. IR thoracentesis 2. Labs ordered 4. PAF - 1. Tele monitor 2. Apparently not on, or needing any rate control meds 3. Holding eliquis for tonight and tomorrow AM to get IR thoracentesis 4. Re-order eliquis later tomorrow presumably 5. HTN - 1. Cont ARB 6. COPD - cont home  nebs 7. DM2 - 1. Cont metformin low dose 8. On plavix chronically - 1. Maybe for PAD? 2. Not sure why shes on this chronically, or if she should be on this chronically given that she is on eliquis already. 3. Dont see anything about any cardiac stents (recent or otherwise). 4. gonna hold this for the Thoracentesis tomorrow 5. Defer to day team to decide if she even should be restarted on it or not.  DVT prophylaxis: Eliquis this AM, holding for procedure Code Status: DNR Family Communication: No family in room Disposition Plan: TBD Consults called: IR consult put into Epic for thoracentesis Admission status: Place in obs    Lanika Colgate M. DO [facility]  How to contact the [facility] Attending or Consulting provider 7A - 7P or covering provider during after hours 7P -7A, for this patient?  1. Check the care team in [facility] and look for a) attending/consulting [facility] provider listed and b) the [facility] team listed 2. Log into [email or web address]  Amion Physician Scheduling and messaging for groups and whole hospitals  On call and physician scheduling software for group practices, residents, hospitalists and other medical providers for call, clinic, rotation and shift schedules. On[employer] is a hospital-wide system for scheduling doctors and paging doctors on call. EasyPlot is for scientific plotting and data analysis.  [email or web address]  and use [facility]'s universal password to access. If you do not have the password, please contact the hospital operator.  3. Locate the [facility] provider you are looking for under [facility] and page to a number that you can be directly reached. 4. If you still have difficulty reaching the provider, please page the [facility] (Director on Call) for the Hospitalists listed on amion for assistance.  05/27/2020, 8:12 PM

## 2020-05-28 ENCOUNTER — Observation Stay (HOSPITAL_COMMUNITY): Payer: Medicare Other

## 2020-05-28 DIAGNOSIS — E43 Unspecified severe protein-calorie malnutrition: Secondary | ICD-10-CM | POA: Diagnosis present

## 2020-05-28 DIAGNOSIS — R091 Pleurisy: Secondary | ICD-10-CM | POA: Diagnosis not present

## 2020-05-28 DIAGNOSIS — Z888 Allergy status to other drugs, medicaments and biological substances status: Secondary | ICD-10-CM | POA: Diagnosis not present

## 2020-05-28 DIAGNOSIS — Z9049 Acquired absence of other specified parts of digestive tract: Secondary | ICD-10-CM | POA: Diagnosis not present

## 2020-05-28 DIAGNOSIS — Z9841 Cataract extraction status, right eye: Secondary | ICD-10-CM | POA: Diagnosis not present

## 2020-05-28 DIAGNOSIS — I110 Hypertensive heart disease with heart failure: Secondary | ICD-10-CM | POA: Diagnosis present

## 2020-05-28 DIAGNOSIS — E1151 Type 2 diabetes mellitus with diabetic peripheral angiopathy without gangrene: Secondary | ICD-10-CM | POA: Diagnosis present

## 2020-05-28 DIAGNOSIS — G2581 Restless legs syndrome: Secondary | ICD-10-CM | POA: Diagnosis present

## 2020-05-28 DIAGNOSIS — E785 Hyperlipidemia, unspecified: Secondary | ICD-10-CM | POA: Diagnosis present

## 2020-05-28 DIAGNOSIS — R0602 Shortness of breath: Secondary | ICD-10-CM | POA: Diagnosis present

## 2020-05-28 DIAGNOSIS — Z7901 Long term (current) use of anticoagulants: Secondary | ICD-10-CM | POA: Diagnosis not present

## 2020-05-28 DIAGNOSIS — J9601 Acute respiratory failure with hypoxia: Secondary | ICD-10-CM | POA: Diagnosis present

## 2020-05-28 DIAGNOSIS — Z20822 Contact with and (suspected) exposure to covid-19: Secondary | ICD-10-CM | POA: Diagnosis present

## 2020-05-28 DIAGNOSIS — I480 Paroxysmal atrial fibrillation: Secondary | ICD-10-CM | POA: Diagnosis present

## 2020-05-28 DIAGNOSIS — I5033 Acute on chronic diastolic (congestive) heart failure: Secondary | ICD-10-CM | POA: Diagnosis present

## 2020-05-28 DIAGNOSIS — E1169 Type 2 diabetes mellitus with other specified complication: Secondary | ICD-10-CM | POA: Diagnosis not present

## 2020-05-28 DIAGNOSIS — D509 Iron deficiency anemia, unspecified: Secondary | ICD-10-CM | POA: Diagnosis present

## 2020-05-28 DIAGNOSIS — I509 Heart failure, unspecified: Secondary | ICD-10-CM

## 2020-05-28 DIAGNOSIS — R5381 Other malaise: Secondary | ICD-10-CM

## 2020-05-28 DIAGNOSIS — I5031 Acute diastolic (congestive) heart failure: Secondary | ICD-10-CM

## 2020-05-28 DIAGNOSIS — Z961 Presence of intraocular lens: Secondary | ICD-10-CM | POA: Diagnosis present

## 2020-05-28 DIAGNOSIS — Z7189 Other specified counseling: Secondary | ICD-10-CM

## 2020-05-28 DIAGNOSIS — Z66 Do not resuscitate: Secondary | ICD-10-CM | POA: Diagnosis present

## 2020-05-28 DIAGNOSIS — K219 Gastro-esophageal reflux disease without esophagitis: Secondary | ICD-10-CM | POA: Diagnosis present

## 2020-05-28 DIAGNOSIS — M810 Age-related osteoporosis without current pathological fracture: Secondary | ICD-10-CM | POA: Diagnosis present

## 2020-05-28 DIAGNOSIS — G20 Parkinson's disease: Secondary | ICD-10-CM | POA: Diagnosis present

## 2020-05-28 DIAGNOSIS — Z9842 Cataract extraction status, left eye: Secondary | ICD-10-CM | POA: Diagnosis not present

## 2020-05-28 DIAGNOSIS — Z90710 Acquired absence of both cervix and uterus: Secondary | ICD-10-CM | POA: Diagnosis not present

## 2020-05-28 DIAGNOSIS — J918 Pleural effusion in other conditions classified elsewhere: Secondary | ICD-10-CM | POA: Diagnosis present

## 2020-05-28 DIAGNOSIS — Z7902 Long term (current) use of antithrombotics/antiplatelets: Secondary | ICD-10-CM | POA: Diagnosis not present

## 2020-05-28 DIAGNOSIS — J449 Chronic obstructive pulmonary disease, unspecified: Secondary | ICD-10-CM | POA: Diagnosis present

## 2020-05-28 DIAGNOSIS — Z789 Other specified health status: Secondary | ICD-10-CM

## 2020-05-28 DIAGNOSIS — I739 Peripheral vascular disease, unspecified: Secondary | ICD-10-CM

## 2020-05-28 LAB — GRAM STAIN

## 2020-05-28 LAB — BASIC METABOLIC PANEL WITH GFR
Anion gap: 8 (ref 5–15)
BUN: 21 mg/dL (ref 8–23)
CO2: 37 mmol/L — ABNORMAL HIGH (ref 22–32)
Calcium: 8.5 mg/dL — ABNORMAL LOW (ref 8.9–10.3)
Chloride: 97 mmol/L — ABNORMAL LOW (ref 98–111)
Creatinine, Ser: 0.69 mg/dL (ref 0.44–1.00)
GFR calc Af Amer: >60 mL/min
GFR calc non Af Amer: >60 mL/min
Glucose, Bld: 126 mg/dL — ABNORMAL HIGH (ref 70–99)
Potassium: 4 mmol/L (ref 3.5–5.1)
Sodium: 142 mmol/L (ref 135–145)

## 2020-05-28 LAB — ECHOCARDIOGRAM COMPLETE
Height: 66 in
Weight: 2232.82 [oz_av]

## 2020-05-28 LAB — GLUCOSE, PLEURAL OR PERITONEAL FLUID: Glucose, Fluid: 109 mg/dL

## 2020-05-28 LAB — BODY FLUID CELL COUNT WITH DIFFERENTIAL
Lymphs, Fluid: 33 %
Monocyte-Macrophage-Serous Fluid: 61 % (ref 50–90)
Neutrophil Count, Fluid: 6 % (ref 0–25)
Total Nucleated Cell Count, Fluid: 152 cu mm (ref 0–1000)

## 2020-05-28 LAB — PROTEIN, TOTAL: Total Protein: 6.4 g/dL — ABNORMAL LOW (ref 6.5–8.1)

## 2020-05-28 LAB — LACTATE DEHYDROGENASE: LDH: 140 U/L (ref 98–192)

## 2020-05-28 LAB — PROTEIN, PLEURAL OR PERITONEAL FLUID: Total protein, fluid: <3 g/dL

## 2020-05-28 LAB — CULTURE, BLOOD (ROUTINE X 2)
Culture: NO GROWTH
Special Requests: ADEQUATE

## 2020-05-28 MED ORDER — APIXABAN 5 MG PO TABS
5.0000 mg | ORAL_TABLET | Freq: Two times a day (BID) | ORAL | Status: DC
  Administered 2020-05-28: 5 mg via ORAL
  Filled 2020-05-28: qty 1

## 2020-05-28 MED ORDER — LIDOCAINE HCL (PF) 1 % IJ SOLN
INTRAMUSCULAR | Status: AC
  Filled 2020-05-28: qty 30

## 2020-05-28 NOTE — Procedures (Signed)
PROCEDURE SUMMARY:  Successful US guided left thoracentesis. Yielded of 950 cc of clear yellow fluid. Pt tolerated procedure well. No immediate complications.  Specimen sent for labs. CXR ordered, no pneumothorax.  EBL < 5 mL  Alwyn Ren, AGACNP-BC [phone] 05/28/2020, 9:32 AM

## 2020-05-28 NOTE — Progress Notes (Signed)
  Echocardiogram 2D Echocardiogram has been performed.  Cheryl Atkinson F 05/28/2020, 8:59 AM

## 2020-05-28 NOTE — Progress Notes (Signed)
PROGRESS NOTE  Cheryl ReeseBetty L Atkinson ZOX:[id number] DOB: 1931-10-28   PCP: Shelva MajesticHunter, Stephen O, MD  Patient is from: Home. Wheelchair dependent at baseline.  DOA: 05/27/2020 LOS: 0  Brief Narrative / Interim history: 86 year old female with history of COPD not on oxygen, diastolic CHF, pleural effusion, HTN, controlled NIDDM-2, A. fib on Eliquis, Imdur, debility and recent hospitalization for respiratory failure, right pleural effusion and CHF brought back to ED by EMS due to worsening shortness of breath and hypoxemia. Reportedly saturating at 88% on RA when EMS arrived requiring 2 L to recover to lower 90s.  In ED, hemodynamically stable. Checks x-ray with right pleural effusion and CHF. BNP 218. CO2 37. Pro-Cal negative. Hgb 8.9 (baseline). WBC 9.3. COVID-19 PCR negative. She was admitted for overnight observation for acute respiratory failure, right pleural effusion and diastolic CHF exacerbation. Started on IV Lasix.  The next day, she underwent thoracocentesis with removal of 900 cc pleural fluid. Breathing improved but continued to require 4 L to maintain saturation in low 90s.  Subjective: Seen and examined earlier this afternoon after thoracocentesis. Reports improvement in her breathing but patient sister disagrees with patient. She denies chest pain, GI or UTI symptoms. Denies fever or chills.  Objective: Vitals:   05/27/20 1704 05/27/20 2215 05/28/20 0857 05/28/20 0920  BP:  128/61 (!) 143/78 (!) 149/83  Pulse:  75    Resp:  20    Temp:  97.7 F (36.5 C)    TempSrc:  Oral    SpO2:  91%    Weight: 63.3 kg     Height: 5\' 6"  (1.676 m)      No intake or output data in the 24 hours ending 05/28/20 1403 Filed Weights   05/27/20 1704  Weight: 63.3 kg    Examination:  GENERAL: Frail looking elderly female. No apparent distress. Nontoxic. HEENT: MMM.  Vision and hearing grossly intact.  NECK: Supple.  No apparent JVD.  RESP: 91% on 4 L by [place] at rest. No IWOB. Bibasilar  crackles. CVS:  RRR. Heart sounds normal.  ABD/GI/GU: BS+. Abd soft, NTND.  MSK/EXT:  Moves extremities. No apparent deformity. No edema.  SKIN: Small scabbed wound over left heel. No signs of infection. NEURO: Awake, alert and oriented for except to date.  No apparent focal neuro deficit. PSYCH: Calm. Normal affect.  Procedures:  6/27-right-sided thoracocentesis with removal of 900 cc fluid  Microbiology summarized: COVID-19 PCR negative.  Assessment & Plan: Acute respiratory failure with hypoxia-likely due to right pleural effusion in the setting of diastolic CHF exacerbation. Requiring 4 L to maintain saturation at 91% at rest. Not on oxygen at home. Still with some crackles on exam but no JVD or edema. -Wean oxygen as able -Incentive spirometry/OOB/PT/OT -Treat CHF exacerbation as below.  Acute on chronic diastolic CHF: Echo with EF of 60 to 65%, no RWMA, RVSP of 37 mmHg, severe LAE and moderate RAE. Presents with dyspnea, hypoxemia and worsening right pleural effusion. BNP 218. CXR with pattern suggestive for CHF. -Continue IV Lasix 40 mg twice daily -Closely monitor I&O, renal function and electrolytes  Right pleural effusion: Likely due to CHF. No signs of infections. -s/p right-sided thoracocentesis with removal of 900 cc of fluid. Seems transudative based on protein -Follow labs -Diuretics as above.  Paroxysmal A. fib: Rate controlled without meds. -Resume home Eliquis  Chronic COPD: Not in exacerbation. Stable. -Continue breathing treatments  Controlled DM-2: Never had significantly elevated CBG in the last 1 month -Check hemoglobin A1c -Will  monitor blood glucose with daily BMP  Essential hypertension: BP slightly elevated. -Continue home Avapro  History of PAD with claudication: Seems she is on Plavix for this but increased risk of bleeding with concurrent use of Eliquis. -Will hold Plavix  Parkinson's disease? -Continue home Rotigotine  patch  Debility/physical deconditioning: [place] and wheelchair dependent at baseline. Recently discharged home from SNF after hospitalization for subdiaphragmatic abscess and completion of antibiotic course -PT/OT eval  Goal of care/DNR/DNI-appropriate.   Body mass index is 22.52 kg/m.       Pressure Injury 05/27/20 Heel Left Unstageable - Full thickness tissue loss in which the base of the injury is covered by slough (yellow, tan, gray, green or brown) and/or eschar (tan, brown or black) in the wound bed. (Active)  05/27/20 2215  Location: Heel  Location Orientation: Left  Staging: Unstageable - Full thickness tissue loss in which the base of the injury is covered by slough (yellow, tan, gray, green or brown) and/or eschar (tan, brown or black) in the wound bed.  Wound Description (Comments):   Present on Admission: Yes   DVT prophylaxis:   apixaban (ELIQUIS) tablet 5 mg  Code Status: DNR/DNI Family Communication: Updated patient's sister at bedside. Status is: Observation  Moving to inpatient due to ongoing respiratory failure and need for IV diuretics. Saturating at 91% on 4 L by nasal cannula at rest. Still with some evidence of fluid overload including hypoxemia and bibasilar crackles.    Dispo: The patient is from: Home              Anticipated d/c is to: To be determined              Anticipated d/c date is: 2 days              Patient currently is not medically stable to d/c.       Consultants:  IR for thoracocentesis   Sch Meds:  Scheduled Meds: . apixaban  5 mg Oral BID  . atorvastatin  40 mg Oral Daily  . furosemide  40 mg Intravenous Daily  . irbesartan  300 mg Oral Daily  . metFORMIN  500 mg Oral Q breakfast  . potassium chloride SA  20 mEq Oral Daily  . Rotigotine  1 patch Topical QHS  . sodium chloride flush  3 mL Intravenous Q12H   Continuous Infusions: . sodium chloride     PRN Meds:.sodium chloride, acetaminophen, albuterol, bisacodyl,  ondansetron (ZOFRAN) IV, polyethylene glycol, sodium chloride flush, traZODone  Antimicrobials: Anti-infectives (From admission, onward)   None       I have personally reviewed the following labs and images: CBC: Recent Labs  Lab 05/23/20 1400 05/24/20 1340 05/27/20 1443  WBC 10.1 11.3* 9.3  NEUTROABS 8.5*  --   --   HGB 8.5* 9.2* 8.9*  HCT 28.7* 30.6* 29.9*  MCV 86.4 86.7 84.7  PLT 311 261 264   BMP &GFR Recent Labs  Lab 05/24/20 0428 05/25/20 0529 05/26/20 0508 05/27/20 1443 05/28/20 0236  NA 139 140 141 141 142  K 3.1* 3.3* 3.4* 3.7 4.0  CL 101 99 98 95* 97*  CO2 29 30 34* 35* 37*  GLUCOSE 121* 110* 117* 131* 126*  BUN 18 23 27* 20 21  CREATININE 0.57 0.74 0.58 0.62 0.69  CALCIUM 8.2* 8.1* 8.3* 8.8* 8.5*   Estimated Creatinine Clearance: 47.3 mL/min (by C-G formula based on SCr of 0.69 mg/dL). Liver & Pancreas: Recent Labs  Lab 05/23/20 1400 05/27/20  1443  AST 20 18  ALT 23 19  ALKPHOS 110 89  BILITOT 1.1 1.2  PROT 7.3 6.7  ALBUMIN 3.4* 3.0*   Recent Labs  Lab 05/23/20 1400 05/27/20 1443  LIPASE 20 23   No results for input(s): AMMONIA in the last 168 hours. Diabetic: No results for input(s): HGBA1C in the last 72 hours. No results for input(s): GLUCAP in the last 168 hours. Cardiac Enzymes: No results for input(s): CKTOTAL, CKMB, CKMBINDEX, TROPONINI in the last 168 hours. Recent Labs    02/11/20 0936  PROBNP 228.0*   Coagulation Profile: No results for input(s): INR, PROTIME in the last 168 hours. Thyroid Function Tests: No results for input(s): TSH, T4TOTAL, FREET4, T3FREE, THYROIDAB in the last 72 hours. Lipid Profile: No results for input(s): CHOL, HDL, LDLCALC, TRIG, CHOLHDL, LDLDIRECT in the last 72 hours. Anemia Panel: No results for input(s): VITAMINB12, FOLATE, FERRITIN, TIBC, IRON, RETICCTPCT in the last 72 hours. Urine analysis:    Component Value Date/Time   COLORURINE YELLOW 05/23/2020 1640   APPEARANCEUR CLEAR  05/23/2020 1640   LABSPEC 1.014 05/23/2020 1640   PHURINE 6.0 05/23/2020 1640   GLUCOSEU NEGATIVE 05/23/2020 1640   HGBUR NEGATIVE 05/23/2020 1640   BILIRUBINUR NEGATIVE 05/23/2020 1640   BILIRUBINUR Negative 02/15/2020 1052   KETONESUR NEGATIVE 05/23/2020 1640   PROTEINUR >=300 (A) 05/23/2020 1640   UROBILINOGEN 0.2 02/15/2020 1052   UROBILINOGEN 0.2 08/20/2019 1732   NITRITE NEGATIVE 05/23/2020 1640   LEUKOCYTESUR NEGATIVE 05/23/2020 1640   Sepsis Labs: Invalid input(s): PROCALCITONIN, LACTICIDVEN  Microbiology: Recent Results (from the past 240 hour(s))  Culture, blood (routine x 2)     Status: None   Collection Time: 05/23/20  2:00 PM   Specimen: BLOOD RIGHT ARM  Result Value Ref Range Status   Specimen Description   Final    BLOOD RIGHT ARM Performed at [facility], [address], [place], [place] [address]    Special Requests   Final    BOTTLES DRAWN AEROBIC AND ANAEROBIC Blood Culture adequate volume Performed at [facility], [address], [place], [place] [address]    Culture   Final    NO GROWTH 5 DAYS Performed at [facility], [address] [place], [place] [address]    Report Status 05/28/2020 FINAL  Final  SARS Coronavirus 2 by RT PCR (hospital order, performed in [facility] hospital lab) Nasopharyngeal Nasopharyngeal Swab     Status: None   Collection Time: 05/23/20  2:24 PM   Specimen: Nasopharyngeal Swab  Result Value Ref Range Status   SARS Coronavirus 2 NEGATIVE NEGATIVE Final    Comment: (NOTE) SARS-CoV-2 target nucleic acids are NOT DETECTED.  The SARS-CoV-2 RNA is generally detectable in upper and lower respiratory specimens during the acute phase of infection. The lowest concentration of SARS-CoV-2 viral copies this assay can detect is 250 copies / mL. A negative result does not preclude SARS-CoV-2 infection and should not be used as the sole basis for treatment or other patient management decisions.  A  negative result may occur with improper specimen collection / handling, submission of specimen other than nasopharyngeal swab, presence of viral mutation(s) within the areas targeted by this assay, and inadequate number of viral copies (<250 copies / mL). A negative result must be combined with clinical observations, patient history, and epidemiological information.  Fact Sheet for Patients:   BoilerBrush.com.cy  Fact Sheet for Healthcare Providers: https://pope.com/  This test is not yet approved  or  cleared by the [place] and has been authorized for detection and/or diagnosis of SARS-CoV-2 by FDA under an Emergency Use Authorization (EUA).  This EUA will remain in effect (meaning this test can be used) for the duration of the COVID-19 declaration under Section 564(b)(1) of the Act, 21 U.S.C. section 360bbb-3(b)(1), unless the authorization is terminated or revoked sooner.  Performed at [facility], [place] [address] [place], [place] [address]   Urine culture     Status: Abnormal   Collection Time: 05/23/20  4:40 PM   Specimen: Urine, Clean Catch  Result Value Ref Range Status   Specimen Description   Final    URINE, CLEAN CATCH Performed at [facility], [place] [address] [place], [place] [address]    Special Requests   Final    NONE Performed at [facility], [place] [address], [place], [place] [address]    Culture MULTIPLE SPECIES PRESENT, SUGGEST RECOLLECTION (A)  Final   Report Status 05/24/2020 FINAL  Final  SARS Coronavirus 2 by RT PCR (hospital order, performed in [facility] hospital lab) Nasopharyngeal Nasopharyngeal Swab     Status: None   Collection Time: 05/27/20  5:11 PM   Specimen: Nasopharyngeal Swab  Result Value Ref Range Status   SARS Coronavirus 2 NEGATIVE NEGATIVE Final    Comment: (NOTE) SARS-CoV-2 target nucleic acids are NOT DETECTED.  The  SARS-CoV-2 RNA is generally detectable in upper and lower respiratory specimens during the acute phase of infection. The lowest concentration of SARS-CoV-2 viral copies this assay can detect is 250 copies / mL. A negative result does not preclude SARS-CoV-2 infection and should not be used as the sole basis for treatment or other patient management decisions.  A negative result may occur with improper specimen collection / handling, submission of specimen other than nasopharyngeal swab, presence of viral mutation(s) within the areas targeted by this assay, and inadequate number of viral copies (<250 copies / mL). A negative result must be combined with clinical observations, patient history, and epidemiological information.  Fact Sheet for Patients:   StrictlyIdeas.no  Fact Sheet for Healthcare Providers: BankingDealers.co.za  This test is not yet approved or  cleared by the [place] FDA and has been authorized for detection and/or diagnosis of SARS-CoV-2 by FDA under an Emergency Use Authorization (EUA).  This EUA will remain in effect (meaning this test can be used) for the duration of the COVID-19 declaration under Section 564(b)(1) of the Act, 21 U.S.C. section 360bbb-3(b)(1), unless the authorization is terminated or revoked sooner.  Performed at [facility], [place] [address], [place], [place] [address]     Radiology Studies: DG Chest 1 View  Result Date: 05/28/2020 CLINICAL DATA:  Post RIGHT thoracentesis EXAM: CHEST  1 VIEW COMPARISON:  05/27/2020 FINDINGS: Minimal enlargement of cardiac silhouette. Rotated to the RIGHT. Mediastinal contours and pulmonary vascularity normal. Atherosclerotic calcification aorta. Resolution of RIGHT pleural effusion and basilar atelectasis post thoracentesis. No pneumothorax. Minimal persistent LEFT basilar atelectasis. IMPRESSION: No pneumothorax following RIGHT thoracentesis. Aortic  Atherosclerosis (ICD10-I70.0). Electronically Signed   By: Lavonia Dana M.D.   On: 05/28/2020 10:09   DG Chest 2 View  Result Date: 05/27/2020 CLINICAL DATA:  Increasing shortness of breath with nausea and diarrhea. Recent hospitalization. EXAM: CHEST - 2 VIEW COMPARISON:  Radiograph 05/25/2020 and 05/23/2020.  CT 04/11/2020. FINDINGS: Stable cardiomegaly, aortic atherosclerosis and ectasia. A moderate size right pleural effusion may be partially loculated posteriorly. There  are associated right-greater-than-left basilar airspace opacities. Overall findings are similar to the most recent study. No pneumothorax. Stable chondroid lesion in the proximal right humerus. IMPRESSION: Persistent right pleural effusion and right-greater-than-left basilar airspace opacities suspicious for pneumonia. No significant change from previous study of 2 days ago. Electronically Signed   By: Carey Bullocks M.D.   On: 05/27/2020 15:28   ECHOCARDIOGRAM COMPLETE  Result Date: 05/28/2020    ECHOCARDIOGRAM REPORT   Patient Name:   SRINIDHI LANDERS Date of Exam: 05/28/2020 Medical Rec #:  [id number]       Height:       66.0 in Accession #:    [id number]      Weight:       139.6 lb Date of Birth:  12-18-1930       BSA:          1.716 m Patient Age:    86 years        BP:           128/61 mmHg Patient Gender: F               HR:           80 bpm. Exam Location:  Inpatient Procedure: 2D Echo, Color Doppler and Cardiac Doppler Indications:    I50.31 Acute diastolic (congestive) heart failure  History:        Patient has prior history of Echocardiogram examinations, most                 recent 03/02/2020.  Sonographer:    Roosvelt Maser RDCS Referring Phys: [phone] JARED M GARDNER IMPRESSIONS  1. Left ventricular ejection fraction, by estimation, is 60 to 65%. The left ventricle has normal function. The left ventricle has no regional wall motion abnormalities. Left ventricular diastolic parameters are indeterminate in the setting of atrial  fibrillation.  2. Right ventricular systolic function is normal. The right ventricular size is normal. There is mildly elevated pulmonary artery systolic pressure. The estimated right ventricular systolic pressure is 36.9 mmHg.  3. Left atrial size was severely dilated.  4. Right atrial size was moderately dilated.  5. The mitral valve is grossly normal, mildly thickened and with mild annular calcification. Mild to moderate mitral valve regurgitation, two jets.  6. The aortic valve is tricuspid, mildly thickened and with mild to moderate annular calcification. Aortic valve regurgitation is not visualized.  7. The inferior vena cava is normal in size with greater than 50% respiratory variability, suggesting right atrial pressure of 3 mmHg.  8. Evidence of atrial level shunting detected by color flow Doppler. There is a small patent foramen ovale with predominantly left to right shunting across the atrial septum. FINDINGS  Left Ventricle: Left ventricular ejection fraction, by estimation, is 60 to 65%. The left ventricle has normal function. The left ventricle has no regional wall motion abnormalities. The left ventricular internal cavity size was normal in size. There is  no left ventricular hypertrophy. Left ventricular diastolic parameters are indeterminate. Right Ventricle: The right ventricular size is normal. No increase in right ventricular wall thickness. Right ventricular systolic function is normal. There is mildly elevated pulmonary artery systolic pressure. The tricuspid regurgitant velocity is 2.91  m/s, and with an assumed right atrial pressure of 3 mmHg, the estimated right ventricular systolic pressure is 36.9 mmHg. Left Atrium: Left atrial size was severely dilated. Right Atrium: Right atrial size was moderately dilated. Pericardium: Trivial pericardial effusion is present. The pericardial effusion is anterior to  the right ventricle. Presence of pericardial fat pad. Mitral Valve: The mitral valve is  grossly normal. There is mild thickening of the mitral valve leaflet(s). Mild mitral annular calcification. Mild to moderate mitral valve regurgitation. Tricuspid Valve: The tricuspid valve is grossly normal. Tricuspid valve regurgitation is mild. Aortic Valve: The aortic valve is tricuspid. . There is mild thickening of the aortic valve. Aortic valve regurgitation is not visualized. Mild to moderate aortic valve annular calcification. There is mild thickening of the aortic valve. Pulmonic Valve: The pulmonic valve was grossly normal. Pulmonic valve regurgitation is trivial. Aorta: The aortic root is normal in size and structure. Venous: The inferior vena cava is normal in size with greater than 50% respiratory variability, suggesting right atrial pressure of 3 mmHg. IAS/Shunts: Evidence of atrial level shunting detected by color flow Doppler. A small patent foramen ovale is detected with predominantly left to right shunting across the atrial septum.  LEFT VENTRICLE PLAX 2D LVIDd:         3.20 cm     Diastology LVIDs:         2.10 cm     LV e' lateral: 9.36 cm/s LV PW:         0.90 cm LV IVS:        0.90 cm LVOT diam:     1.70 cm LV SV:         35 LV SV Index:   20 LVOT Area:     2.27 cm  LV Volumes (MOD) LV vol d, MOD A2C: 62.5 ml LV vol d, MOD A4C: 55.4 ml LV vol s, MOD A2C: 29.2 ml LV vol s, MOD A4C: 20.0 ml LV SV MOD A2C:     33.3 ml LV SV MOD A4C:     55.4 ml LV SV MOD BP:      36.4 ml RIGHT VENTRICLE          IVC RV Basal diam:  3.80 cm  IVC diam: 2.00 cm LEFT ATRIUM              Index       RIGHT ATRIUM           Index LA diam:        3.90 cm  2.27 cm/m  RA Area:     23.00 cm LA Vol (A2C):   123.0 ml 71.67 ml/m RA Volume:   73.80 ml  43.00 ml/m LA Vol (A4C):   96.3 ml  56.11 ml/m LA Biplane Vol: 110.0 ml 64.10 ml/m  AORTIC VALVE LVOT Vmax:   89.40 cm/s LVOT Vmean:  53.900 cm/s LVOT VTI:    0.154 m  AORTA Ao Root diam: 3.20 cm Ao Asc diam:  3.40 cm MR Peak grad:    86.5 mmHg   TRICUSPID VALVE MR Mean  grad:    63.0 mmHg   TR Peak grad:   33.9 mmHg MR Vmax:         465.00 cm/s TR Vmax:        291.00 cm/s MR Vmean:        373.0 cm/s MR PISA:         0.57 cm    SHUNTS MR PISA Eff ROA: 4 mm       Systemic VTI:  0.15 m MR PISA Radius:  0.30 cm     Systemic Diam: 1.70 cm Nona Dell MD Electronically signed by Nona Dell MD Signature Date/Time: 05/28/2020/9:56:01 AM    Final  US THORACENTESIS ASP PLEURAL SPACE W/IMG GUIDE  Result Date: 05/28/2020 INDICATION: Patient with a history of congestive heart failure presents today with a right pleural effusion. Interventional radiology asked to perform a therapeutic and diagnostic thoracentesis. EXAM: ULTRASOUND GUIDED THORACENTESIS MEDICATIONS: 1% lidocaine 10 mL COMPLICATIONS: None immediate. PROCEDURE: An ultrasound guided thoracentesis was thoroughly discussed with the patient and questions answered. The benefits, risks, alternatives and complications were also discussed. The patient understands and wishes to proceed with the procedure. Written consent was obtained. Ultrasound was performed to localize and mark an adequate pocket of fluid in the right chest. The area was then prepped and draped in the normal sterile fashion. 1% Lidocaine was used for local anesthesia. Under ultrasound guidance a 6 Fr Safe-T-Centesis catheter was introduced. Thoracentesis was performed. The catheter was removed and a dressing applied. FINDINGS: A total of approximately 950 mL of clear yellow fluid was removed. Samples were sent to the laboratory as requested by the clinical team. IMPRESSION: Successful ultrasound guided right thoracentesis yielding 950 mL of pleural fluid. Read by: Alwyn Ren, NP Electronically Signed   By: Simonne Come M.D.   On: 05/28/2020 10:45      Zakry Caso T. [place] [facility]  If 7PM-7AM, please contact night-coverage [email or web address] Password [facility] 05/28/2020, 2:03 PM

## 2020-05-29 DIAGNOSIS — E873 Alkalosis: Secondary | ICD-10-CM

## 2020-05-29 DIAGNOSIS — D509 Iron deficiency anemia, unspecified: Secondary | ICD-10-CM

## 2020-05-29 LAB — RENAL FUNCTION PANEL
Albumin: 2.4 g/dL — ABNORMAL LOW (ref 3.5–5.0)
Anion gap: 9 (ref 5–15)
BUN: 29 mg/dL — ABNORMAL HIGH (ref 8–23)
CO2: 36 mmol/L — ABNORMAL HIGH (ref 22–32)
Calcium: 8.5 mg/dL — ABNORMAL LOW (ref 8.9–10.3)
Chloride: 98 mmol/L (ref 98–111)
Creatinine, Ser: 0.79 mg/dL (ref 0.44–1.00)
GFR calc Af Amer: >60 mL/min (ref >60)
GFR calc non Af Amer: >60 mL/min (ref >60)
Glucose, Bld: 107 mg/dL — ABNORMAL HIGH (ref 70–99)
Phosphorus: 3.3 mg/dL (ref 2.5–4.6)
Potassium: 4.2 mmol/L (ref 3.5–5.1)
Sodium: 143 mmol/L (ref 135–145)

## 2020-05-29 LAB — CBC
HCT: 25.8 % — ABNORMAL LOW (ref 36.0–46.0)
HCT: 26.8 % — ABNORMAL LOW (ref 36.0–46.0)
Hemoglobin: 7.7 g/dL — ABNORMAL LOW (ref 12.0–15.0)
Hemoglobin: 7.9 g/dL — ABNORMAL LOW (ref 12.0–15.0)
MCH: 25.5 pg — ABNORMAL LOW (ref 26.0–34.0)
MCH: 25.3 pg — ABNORMAL LOW (ref 26.0–34.0)
MCHC: 29.8 g/dL — ABNORMAL LOW (ref 30.0–36.0)
MCHC: 29.5 g/dL — ABNORMAL LOW (ref 30.0–36.0)
MCV: 85.4 fL (ref 80.0–100.0)
MCV: 85.9 fL (ref 80.0–100.0)
Platelets: 241 10*3/uL (ref 150–400)
Platelets: 246 10*3/uL (ref 150–400)
RBC: 3.02 MIL/uL — ABNORMAL LOW (ref 3.87–5.11)
RBC: 3.12 MIL/uL — ABNORMAL LOW (ref 3.87–5.11)
RDW: 16.9 % — ABNORMAL HIGH (ref 11.5–15.5)
RDW: 16.7 % — ABNORMAL HIGH (ref 11.5–15.5)
WBC: 8.6 10*3/uL (ref 4.0–10.5)
WBC: 8.1 10*3/uL (ref 4.0–10.5)
nRBC: 0 % (ref 0.0–0.2)
nRBC: 0 % (ref 0.0–0.2)

## 2020-05-29 LAB — RETICULOCYTES
Immature Retic Fract: 10.4 % (ref 2.3–15.9)
RBC.: 2.96 MIL/uL — ABNORMAL LOW (ref 3.87–5.11)
Retic Count, Absolute: 69 10*3/uL (ref 19.0–186.0)
Retic Ct Pct: 2.5 % (ref 0.4–3.1)

## 2020-05-29 LAB — CYTOLOGY - NON PAP

## 2020-05-29 LAB — HEMOGLOBIN A1C
Hgb A1c MFr Bld: 6.1 % — ABNORMAL HIGH (ref 4.8–5.6)
Mean Plasma Glucose: 128.37 mg/dL

## 2020-05-29 LAB — MAGNESIUM: Magnesium: 1.7 mg/dL (ref 1.7–2.4)

## 2020-05-29 LAB — OCCULT BLOOD X 1 CARD TO LAB, STOOL: Fecal Occult Bld: NEGATIVE

## 2020-05-29 LAB — IRON AND TIBC
Iron: 20 ug/dL — ABNORMAL LOW (ref 28–170)
Saturation Ratios: 6 % — ABNORMAL LOW (ref 10.4–31.8)
TIBC: 325 ug/dL (ref 250–450)
UIBC: 305 ug/dL

## 2020-05-29 LAB — FLOW CYTOMETRY REQUEST - FLUID (INPATIENT)

## 2020-05-29 LAB — VITAMIN B12: Vitamin B-12: 183 pg/mL (ref 180–914)

## 2020-05-29 LAB — FOLATE: Folate: 17.2 ng/mL (ref >5.9)

## 2020-05-29 LAB — FERRITIN: Ferritin: 42 ng/mL (ref 11–307)

## 2020-05-29 MED ORDER — SODIUM CHLORIDE 0.9 % IV SOLN
510.0000 mg | Freq: Once | INTRAVENOUS | Status: AC
  Administered 2020-05-29: 510 mg via INTRAVENOUS
  Filled 2020-05-29: qty 17

## 2020-05-29 MED ORDER — VITAMIN B-12 1000 MCG PO TABS
1000.0000 ug | ORAL_TABLET | Freq: Every day | ORAL | Status: DC
  Administered 2020-05-29 – 2020-05-31 (×3): 1000 ug via ORAL
  Filled 2020-05-29 (×3): qty 1

## 2020-05-29 MED ORDER — CYANOCOBALAMIN 1000 MCG/ML IJ SOLN
1000.0000 ug | Freq: Once | INTRAMUSCULAR | Status: DC
  Filled 2020-05-29: qty 1

## 2020-05-29 NOTE — Progress Notes (Signed)
Pt had bowel movements x2 per shift.

## 2020-05-29 NOTE — Evaluation (Signed)
Physical Therapy Evaluation Patient Details Name: Cheryl Atkinson MRN: [id number] DOB: [birth year] Today's Date: 05/29/2020   History of Present Illness  Patient is an 86 year old female with a history of atrial fibrillation, hypertension, COPD, CHF, prediabetes ,   Lap chole 04/05/2020 readmission 5/6 through 04/17/2020 lethargy confusion 2/2 opiate narcosis. DC to SNF, then home 05/11/20. Comes to ED 05/23/20 with HTN , SOB, acute exacerbation of CHF  Clinical Impression   Pt presents with generalized weakness, impaired sitting and standing balance, decreased activity tolerance, and difficulty mobilizing out of bed. Pt to benefit from acute PT to address deficits. Pt required mod-max assist for bed mobility and transfer to stand with use of stedy, pt unable to come to standing with use of RW with OT earlier this day. PT recommending SNF level of care given pt's current mobility status, pt likely to refuse so HHPT as secondary option. PT to progress mobility as tolerated, and will continue to follow acutely.      Follow Up Recommendations SNF;Supervision/Assistance - 24 hour (pt and family with recent SNF refusal)    Equipment Recommendations  None recommended by PT    Recommendations for Other Services       Precautions / Restrictions Precautions Precautions: Fall Restrictions Weight Bearing Restrictions: No      Mobility  Bed Mobility Overal bed mobility: Needs Assistance Bed Mobility: Supine to Sit     Supine to sit: Mod assist;HOB elevated Sit to supine: Max assist   General bed mobility comments: Mod assist for trunk elevation LE management to EOB, and scooting to EOB with use of bed pads and pt lateral leaning. Pt with heavy posterior leaning, improved with feet on ground and bilateral UEs supported on bed and bedrail.  Transfers Overall transfer level: Needs assistance Equipment used: Ambulation equipment used Transfers: Sit to/from Stand Sit to Stand: Mod assist          General transfer comment: mod assist for power up, hip extension with posterior facilitation, and steadying. Verbal cuing for pulling with UEs to stand, hips forward, chest up. Once in stedy, pt stood x2 from elevated stedy seat as intervention for LE weakness and difficulty with transfer training.  Ambulation/Gait             General Gait Details: unable  Stairs            Wheelchair Mobility    Modified Rankin (Stroke Patients Only)       Balance Overall balance assessment: Needs assistance;History of Falls Sitting-balance support: Bilateral upper extremity supported;Feet supported Sitting balance-Leahy Scale: Poor Sitting balance - Comments: heavy posterior leaning requiring min-mod PT assist posteriorly to correct. Improved sitting balance with bilateral UE and LE contact     Standing balance-Leahy Scale: Poor Standing balance comment: unable, posterior bias on attempt                              Pertinent Vitals/Pain Pain Assessment: No/denies pain    Home Living Family/patient expects to be discharged to:: Private residence Living Arrangements: Children;Other relatives (son, cousin) Available Help at Discharge: Family;Available 24 hours/day Type of Home: House Home Access: Stairs to enter Entrance Stairs-Rails: None Entrance Stairs-Number of Steps: 1 Home Layout: One level Home Equipment: Walker - 4 wheels;Cane - single point;Walker - 2 wheels;Shower seat - built in;Grab bars - tub/shower;Grab bars - toilet Additional Comments: Pt reports son and cousin both work, but work alternating schedules so pt  has someone with her all the time    Prior Function Level of Independence: Needs assistance   Gait / Transfers Assistance Needed: Pt states she uses rollator in house and onto driveway  ADL's / Homemaking Assistance Needed: Pt reports assistance from cousin with bathing, dressing as needed. Has assistance walking to bathroom, uses BSC  day/night prn        Hand Dominance   Dominant Hand: Right    Extremity/Trunk Assessment   Upper Extremity Assessment Upper Extremity Assessment: Defer to OT evaluation    Lower Extremity Assessment Lower Extremity Assessment: Generalized weakness    Cervical / Trunk Assessment Cervical / Trunk Assessment: Kyphotic  Communication   Communication: No difficulties  Cognition Arousal/Alertness: Awake/alert Behavior During Therapy: WFL for tasks assessed/performed Overall Cognitive Status: No family/caregiver present to determine baseline cognitive functioning                                 General Comments: lacks insight into signficant mobility deficits, requires multimodal cuing for mobility      General Comments General comments (skin integrity, edema, etc.): SpO2 95-96% with mobility, O2 removed at end of session approved by RN    Exercises     Assessment/Plan    PT Assessment Patient needs continued PT services  PT Problem List Decreased strength;Decreased balance;Decreased cognition;Decreased knowledge of precautions;Decreased range of motion;Decreased mobility;Decreased activity tolerance;Decreased safety awareness;Decreased skin integrity;Cardiopulmonary status limiting activity       PT Treatment Interventions DME instruction;Therapeutic activities;Gait training;Therapeutic exercise;Patient/family education;Functional mobility training;Balance training    PT Goals (Current goals can be found in the Care Plan section)  Acute Rehab PT Goals Patient Stated Goal: to go home PT Goal Formulation: With patient Time For Goal Achievement: 06/07/20 Potential to Achieve Goals: Fair    Frequency Min 3X/week   Barriers to discharge        Co-evaluation               AM-PAC PT "6 Clicks" Mobility  Outcome Measure Help needed turning from your back to your side while in a flat bed without using bedrails?: A Little Help needed moving from  lying on your back to sitting on the side of a flat bed without using bedrails?: A Lot Help needed moving to and from a bed to a chair (including a wheelchair)?: A Lot Help needed standing up from a chair using your arms (e.g., wheelchair or bedside chair)?: A Lot Help needed to walk in hospital room?: Total Help needed climbing 3-5 steps with a railing? : Total 6 Click Score: 11    End of Session Equipment Utilized During Treatment: Gait belt;Oxygen Activity Tolerance: Patient tolerated treatment well;Patient limited by fatigue Patient left: in chair;with chair alarm set;with call bell/phone within reach Nurse Communication: Mobility status PT Visit Diagnosis: Muscle weakness (generalized) (M62.81);Unsteadiness on feet (R26.81)    Time: 2992-4268 PT Time Calculation (min) (ACUTE ONLY): 26 min   Charges:   PT Evaluation $PT Eval Low Complexity: 1 Low PT Treatments $Therapeutic Activity: 8-22 mins       Mitchael Luckey E, PT Acute Rehabilitation Services Pager [phone]  Office [phone]   Ameri Cahoon D Elonda Husky 05/29/2020, 4:35 PM

## 2020-05-29 NOTE — Progress Notes (Signed)
PROGRESS NOTE  Cheryl Atkinson OBS:[id number] DOB: September 03, 1931   PCP: Shelva Majestic, MD  Patient is from: Home. Wheelchair dependent at baseline.  DOA: 05/27/2020 LOS: 1  Brief Narrative / Interim history: 86 year old female with history of COPD not on oxygen, diastolic CHF, pleural effusion, HTN, controlled NIDDM-2, A. fib on Eliquis, Imdur, debility and recent hospitalization for respiratory failure, right pleural effusion and CHF brought back to ED by EMS due to worsening shortness of breath and hypoxemia. Reportedly saturating at 88% on RA when EMS arrived requiring 2 L to recover to lower 90s.  In ED, hemodynamically stable. Checks x-ray with right pleural effusion and CHF. BNP 218. CO2 37. Pro-Cal negative. Hgb 8.9 (baseline). WBC 9.3. COVID-19 PCR negative. She was admitted for overnight observation for acute respiratory failure, right pleural effusion and diastolic CHF exacerbation. Started on IV Lasix.  The next day, she underwent thoracocentesis with removal of 900 cc pleural fluid. Breathing improved but continued to require 4 L to maintain saturation in low 90s.  Subjective: Seen and examined earlier this morning.  No major events overnight of this morning.  No complaints but not a great historian.  She denies chest pain.  Breathing improved. RN and sister noted cough.  Denies GI or UTI symptoms.  Denies melena or hematochezia.  Patient's sister at bedside.  She asks about hospital bed which is appropriate.  Objective: Vitals:   05/28/20 0920 05/28/20 2144 05/29/20 0537 05/29/20 0748  BP: (!) 149/83  (!) 128/54 (!) 130/48  Pulse:   66 (!) 58  Resp:   16 14  Temp:   97.9 F (36.6 C) 99 F (37.2 C)  TempSrc:  Oral    SpO2:   100% 100%  Weight:      Height:        Intake/Output Summary (Last 24 hours) at 05/29/2020 1404 Last data filed at 05/28/2020 2146 Gross per 24 hour  Intake 120 ml  Output --  Net 120 ml   Filed Weights   05/27/20 1704  Weight: 63.3 kg     Examination:  GENERAL: Frail looking elderly female.  No apparent distress.  Nontoxic. HEENT: MMM.  Vision and hearing grossly intact.  NECK: Supple.  No apparent JVD.  RESP: 100% on 4 L.  No IWOB.  Fair aeration with bibasilar crackles. CVS:  RRR. Heart sounds normal.  ABD/GI/GU: BS+. Abd soft, NTND.  MSK/EXT:  Moves extremities. No apparent deformity. No edema.  SKIN: no apparent skin lesion or wound NEURO: Awake, alert and oriented fairly.  No apparent focal neuro deficit. PSYCH: Calm. Normal affect.  Procedures:  6/27-right-sided thoracocentesis with removal of 900 cc fluid  Microbiology summarized: COVID-19 PCR negative.  Assessment & Plan: Acute respiratory failure with hypoxia-likely due to right pleural effusion in the setting of diastolic CHF exacerbation. Still with some crackles but significant improvement in her oxygenation. -Wean oxygen to room air as able-not on oxygen at home. -Incentive spirometry/OOB/PT/OT -Treat CHF exacerbation as below.  Acute on chronic diastolic CHF: Echo with EF of 60 to 65%, no RWMA, RVSP of 37 mmHg, severe LAE and moderate RAE.  Responded well to thoracocentesis on diuretics.  I&O incomplete. -Switch to oral Lasix 40 mg twice daily starting this evening. -Closely monitor I&O, renal function and electrolytes  Right pleural effusion: Likely due to CHF. No signs of infections. -s/p right-sided thoracocentesis with removal of 900 cc of fluid. Seems transudative based on protein -Follow culture and pH. -Diuretics as above.  Paroxysmal  A. fib: Rate controlled without meds. -Hold Eliquis in the setting of hemoglobin drop  Iron deficiency anemia: Baseline Hgb 8-9> 8.9 (admit)> 7.7> 7.9.  Iron sat 6%.  TIBC 325.  Ferritin 42.  B12 183.  FOBT negative. -Hold Eliquis today -IV Feraheme and p.o. ferrous sulfate -IM vitamin B12 and then oral  Chronic COPD: Not in exacerbation. Stable. -Continue breathing treatments  Controlled DM-2:  Never had significantly elevated CBG in the last 1 month.  A1c 6.1%. -monitor blood glucose with daily BMP  Essential hypertension: Normotensive for most part. -Continue home Avapro  History of PAD with claudication: Seems she is on Plavix for this but increased risk of bleeding with concurrent use of Eliquis. -Will hold Plavix  Parkinson's disease? -Continue home Rotigotine patch  Debility/physical deconditioning: Walker and wheelchair dependent at baseline. Recently discharged home from SNF after hospitalization for subdiaphragmatic abscess and completion of antibiotic course -PT/OT eval  Goal of care/DNR/DNI-appropriate.   Body mass index is 22.52 kg/m.       Pressure Injury 05/27/20 Heel Left Unstageable - Full thickness tissue loss in which the base of the injury is covered by slough (yellow, tan, gray, green or brown) and/or eschar (tan, brown or black) in the wound bed. (Active)  05/27/20 2215  Location: Heel  Location Orientation: Left  Staging: Unstageable - Full thickness tissue loss in which the base of the injury is covered by slough (yellow, tan, gray, green or brown) and/or eschar (tan, brown or black) in the wound bed.  Wound Description (Comments):   Present on Admission: Yes   DVT prophylaxis:    Code Status: DNR/DNI Family Communication: Updated patient's sister at bedside. Status is: Inpatient  Remains inpatient appropriate because:Ongoing diagnostic testing needed not appropriate for outpatient work up and Inpatient level of care appropriate due to severity of illness to ensure H&H is stable.    Dispo: The patient is from: Home              Anticipated d/c is to: Likely home with home health and DME in the next 24 hours.              Anticipated d/c date is: 1 day              Patient currently is not medically stable to d/c.            Consultants:  IR for thoracocentesis   Sch Meds:  Scheduled Meds: . atorvastatin  40 mg Oral Daily   . cyanocobalamin  1,000 mcg Intramuscular Once  . furosemide  40 mg Intravenous Daily  . irbesartan  300 mg Oral Daily  . metFORMIN  500 mg Oral Q breakfast  . potassium chloride SA  20 mEq Oral Daily  . Rotigotine  1 patch Topical QHS  . sodium chloride flush  3 mL Intravenous Q12H  . vitamin B-12  1,000 mcg Oral Daily   Continuous Infusions: . sodium chloride     PRN Meds:.sodium chloride, acetaminophen, albuterol, bisacodyl, ondansetron (ZOFRAN) IV, polyethylene glycol, sodium chloride flush, traZODone  Antimicrobials: Anti-infectives (From admission, onward)   None       I have personally reviewed the following labs and images: CBC: Recent Labs  Lab 05/23/20 1400 05/24/20 1340 05/27/20 1443 05/29/20 0405 05/29/20 1049  WBC 10.1 11.3* 9.3 8.6 8.1  NEUTROABS 8.5*  --   --   --   --   HGB 8.5* 9.2* 8.9* 7.7* 7.9*  HCT 28.7* 30.6* 29.9* 25.8* 26.8*  MCV 86.4 86.7 84.7 85.4 85.9  PLT 311 261 264 241 246   BMP &GFR Recent Labs  Lab 05/25/20 0529 05/26/20 0508 05/27/20 1443 05/28/20 0236 05/29/20 0405  NA 140 141 141 142 143  K 3.3* 3.4* 3.7 4.0 4.2  CL 99 98 95* 97* 98  CO2 30 34* 35* 37* 36*  GLUCOSE 110* 117* 131* 126* 107*  BUN 23 27* 20 21 29*  CREATININE 0.74 0.58 0.62 0.69 0.79  CALCIUM 8.1* 8.3* 8.8* 8.5* 8.5*  MG  --   --   --   --  1.7  PHOS  --   --   --   --  3.3   Estimated Creatinine Clearance: 47.3 mL/min (by C-G formula based on SCr of 0.79 mg/dL). Liver & Pancreas: Recent Labs  Lab 05/23/20 1400 05/27/20 1443 05/28/20 1416 05/29/20 0405  AST 20 18  --   --   ALT 23 19  --   --   ALKPHOS 110 89  --   --   BILITOT 1.1 1.2  --   --   PROT 7.3 6.7 6.4*  --   ALBUMIN 3.4* 3.0*  --  2.4*   Recent Labs  Lab 05/23/20 1400 05/27/20 1443  LIPASE 20 23   No results for input(s): AMMONIA in the last 168 hours. Diabetic: Recent Labs    05/29/20 0405  HGBA1C 6.1*   No results for input(s): GLUCAP in the last 168 hours. Cardiac  Enzymes: No results for input(s): CKTOTAL, CKMB, CKMBINDEX, TROPONINI in the last 168 hours. Recent Labs    02/11/20 0936  PROBNP 228.0*   Coagulation Profile: No results for input(s): INR, PROTIME in the last 168 hours. Thyroid Function Tests: No results for input(s): TSH, T4TOTAL, FREET4, T3FREE, THYROIDAB in the last 72 hours. Lipid Profile: No results for input(s): CHOL, HDL, LDLCALC, TRIG, CHOLHDL, LDLDIRECT in the last 72 hours. Anemia Panel: Recent Labs    05/29/20 0405  VITAMINB12 183  FOLATE 17.2  FERRITIN 42  TIBC 325  IRON 20*  RETICCTPCT 2.5   Urine analysis:    Component Value Date/Time   COLORURINE YELLOW 05/23/2020 1640   APPEARANCEUR CLEAR 05/23/2020 1640   LABSPEC 1.014 05/23/2020 1640   PHURINE 6.0 05/23/2020 1640   GLUCOSEU NEGATIVE 05/23/2020 1640   HGBUR NEGATIVE 05/23/2020 1640   BILIRUBINUR NEGATIVE 05/23/2020 1640   BILIRUBINUR Negative 02/15/2020 1052   KETONESUR NEGATIVE 05/23/2020 1640   PROTEINUR >=300 (A) 05/23/2020 1640   UROBILINOGEN 0.2 02/15/2020 1052   UROBILINOGEN 0.2 08/20/2019 1732   NITRITE NEGATIVE 05/23/2020 1640   LEUKOCYTESUR NEGATIVE 05/23/2020 1640   Sepsis Labs: Invalid input(s): PROCALCITONIN, [place]  Microbiology: Recent Results (from the past 240 hour(s))  Culture, blood (routine x 2)     Status: None   Collection Time: 05/23/20  2:00 PM   Specimen: BLOOD RIGHT ARM  Result Value Ref Range Status   Specimen Description   Final    BLOOD RIGHT ARM Performed at [facility], [address], [place], [place] [address]    Special Requests   Final    BOTTLES DRAWN AEROBIC AND ANAEROBIC Blood Culture adequate volume Performed at [facility] [address] [place], [place] [address]    Culture   Final    NO GROWTH 5 DAYS Performed at [facility], [place] [address], [place], [place] [address]    Report Status 05/28/2020 FINAL  Final  SARS Coronavirus 2 by RT  PCR (hospital order,  performed in [facility] hospital lab) Nasopharyngeal Nasopharyngeal Swab     Status: None   Collection Time: 05/23/20  2:24 PM   Specimen: Nasopharyngeal Swab  Result Value Ref Range Status   SARS Coronavirus 2 NEGATIVE NEGATIVE Final    Comment: (NOTE) SARS-CoV-2 target nucleic acids are NOT DETECTED.  The SARS-CoV-2 RNA is generally detectable in upper and lower respiratory specimens during the acute phase of infection. The lowest concentration of SARS-CoV-2 viral copies this assay can detect is 250 copies / mL. A negative result does not preclude SARS-CoV-2 infection and should not be used as the sole basis for treatment or other patient management decisions.  A negative result may occur with improper specimen collection / handling, submission of specimen other than nasopharyngeal swab, presence of viral mutation(s) within the areas targeted by this assay, and inadequate number of viral copies (<250 copies / mL). A negative result must be combined with clinical observations, patient history, and epidemiological information.  Fact Sheet for Patients:   BoilerBrush.com.cyhttps://www.fda.gov/media/136312/download  Fact Sheet for Healthcare Providers: https://pope.com/https://www.fda.gov/media/136313/download  This test is not yet approved or  cleared by the [place] FDA and has been authorized for detection and/or diagnosis of SARS-CoV-2 by FDA under an Emergency Use Authorization (EUA).  This EUA will remain in effect (meaning this test can be used) for the duration of the COVID-19 declaration under Section 564(b)(1) of the Act, 21 U.S.C. section 360bbb-3(b)(1), unless the authorization is terminated or revoked sooner.  Performed at [facility], [address] [place], [place] [address]   Urine culture     Status: Abnormal   Collection Time: 05/23/20  4:40 PM   Specimen: Urine, Clean Catch  Result Value Ref Range Status   Specimen Description   Final    URINE, CLEAN  CATCH Performed at [facility], [address] [place], [place] [address]    Special Requests   Final    NONE Performed at [facility], [address] [place], [place] [address]    Culture MULTIPLE SPECIES PRESENT, SUGGEST RECOLLECTION (A)  Final   Report Status 05/24/2020 FINAL  Final  SARS Coronavirus 2 by RT PCR (hospital order, performed in [facility] hospital lab) Nasopharyngeal Nasopharyngeal Swab     Status: None   Collection Time: 05/27/20  5:11 PM   Specimen: Nasopharyngeal Swab  Result Value Ref Range Status   SARS Coronavirus 2 NEGATIVE NEGATIVE Final    Comment: (NOTE) SARS-CoV-2 target nucleic acids are NOT DETECTED.  The SARS-CoV-2 RNA is generally detectable in upper and lower respiratory specimens during the acute phase of infection. The lowest concentration of SARS-CoV-2 viral copies this assay can detect is 250 copies / mL. A negative result does not preclude SARS-CoV-2 infection and should not be used as the sole basis for treatment or other patient management decisions.  A negative result may occur with improper specimen collection / handling, submission of specimen other than nasopharyngeal swab, presence of viral mutation(s) within the areas targeted by this assay, and inadequate number of viral copies (<250 copies / mL). A negative result must be combined with clinical observations, patient history, and epidemiological information.  Fact Sheet for Patients:   BoilerBrush.com.cyhttps://www.fda.gov/media/136312/download  Fact Sheet for Healthcare Providers: https://pope.com/https://www.fda.gov/media/136313/download  This test is not yet approved or  cleared by the [place] FDA and has been authorized for detection and/or diagnosis of SARS-CoV-2 by FDA under an Emergency Use Authorization (EUA).  This EUA  will remain in effect (meaning this test can be used) for the duration of the COVID-19 declaration under Section 564(b)(1) of the Act, 21  U.S.C. section 360bbb-3(b)(1), unless the authorization is terminated or revoked sooner.  Performed at [facility], [address], [place], [place] [address]   Gram stain     Status: None   Collection Time: 05/28/20  9:44 AM   Specimen: Pleura  Result Value Ref Range Status   Specimen Description PLEURAL FLUID  Final   Special Requests NONE  Final   Gram Stain   Final    WBC PRESENT,BOTH PMN AND MONONUCLEAR NO ORGANISMS SEEN CYTOSPIN SMEAR Performed at [facility], [address] [place], [place] [address]    Report Status 05/28/2020 FINAL  Final    Radiology Studies: No results found.    Hercules Hasler T. Jamarco Zaldivar [facility]  If 7PM-7AM, please contact night-coverage [email or web address] Password [facility] 05/29/2020, 2:04 PM

## 2020-05-29 NOTE — Plan of Care (Signed)

## 2020-05-29 NOTE — Progress Notes (Signed)
Occupational Therapy Evaluation Patient Details Name: Cheryl Atkinson MRN: [id number] DOB: 1931-09-15 Today's Date: 05/29/2020    History of Present Illness Patient is an 86 year old female with a history of atrial fibrillation, hypertension, COPD, CHF, prediabetes ,   Lap chole 04/05/2020 readmission 5/6 through 04/17/2020 lethargy confusion 2/2 opiate narcosis. DC to SNF, then home 05/11/20. Comes to ED 05/23/20 with HTN , SOB, acute exacerbation of CHF   Clinical Impression   PTA, pt reports living with family (son and cousin) who alternate work schedules so pt has 24/7 supervision. Pt reports limited assist with transfers and ADLs from family. Pt reports previously able to ambulate with walker in the home and was receiving HH therapy PTA. Presently, pt received on 2 L O2 (did not wear at baseline) and reports feeling better after thoracentesis. Pt with severe deficits in strength, Max A for bed mobility and unable to complete a sit to stand transfer with RW and 1 person assist. Based on presentation, pt will require extensive assist for LB ADLs at this time and unsure if family able to physically assist pt in this manner. If pt to DC home at this level, pt will be a high fall and high readmission risk. If pt/family agreeable, pt would benefit from short term SNF rehab to maximize safety and independence. Will continue to follow acutely.     Follow Up Recommendations  SNF;Supervision/Assistance - 24 hour    Equipment Recommendations  Hospital bed    Recommendations for Other Services       Precautions / Restrictions Precautions Precautions: Fall Restrictions Weight Bearing Restrictions: No      Mobility Bed Mobility Overal bed mobility: Needs Assistance Bed Mobility: Supine to Sit;Sit to Supine     Supine to sit: Max assist Sit to supine: Max assist   General bed mobility comments: Max A to advance trunk to EOB, Max A to return B LE back to bed  Transfers Overall transfer level:  Needs assistance               General transfer comment: Attempted sit to stand transfer with RW/1 person assist at bedside, unable to complete with posterior lean and feet sliding. Plan to try Stedy during next session    Balance Overall balance assessment: Needs assistance;History of Falls Sitting-balance support: Bilateral upper extremity supported;Feet supported Sitting balance-Leahy Scale: Poor Sitting balance - Comments: Pt requires Min A to maintain sitting balance with posterior lean. Unable to maintain static balance      Standing balance-Leahy Scale: Zero Standing balance comment: unable, posterior bias on attempt                            ADL either performed or assessed with clinical judgement   ADL Overall ADL's : Needs assistance/impaired Eating/Feeding: Set up;Sitting   Grooming: Set up;Wash/dry face;Bed level   Upper Body Bathing: Moderate assistance;Sitting   Lower Body Bathing: Total assistance;Bed level   Upper Body Dressing : Minimal assistance;Bed level   Lower Body Dressing: Total assistance;Bed level   Toilet Transfer: +2 for physical assistance Toilet Transfer Details (indicate cue type and reason): unable to safely attempt with 1 person/RW assist Toileting- Clothing Manipulation and Hygiene: Total assistance;Bed level       Functional mobility during ADLs: +2 for physical assistance General ADL Comments: Pt with decreased strength, impaired sitting balance with posterior lean, and decreased endurance requiring extensive assist for ADLs     Vision Baseline  Vision/History: Wears glasses Wears Glasses: At all times Patient Visual Report: No change from baseline Vision Assessment?: No apparent visual deficits     Perception     Praxis      Pertinent Vitals/Pain Pain Assessment: No/denies pain     Hand Dominance Right   Extremity/Trunk Assessment Upper Extremity Assessment Upper Extremity Assessment: Generalized  weakness   Lower Extremity Assessment Lower Extremity Assessment: Defer to PT evaluation   Cervical / Trunk Assessment Cervical / Trunk Assessment: Kyphotic   Communication Communication Communication: No difficulties   Cognition Arousal/Alertness: Awake/alert Behavior During Therapy: WFL for tasks assessed/performed Overall Cognitive Status: No family/caregiver present to determine baseline cognitive functioning                                 General Comments: orinted x 4, does  state that she wants to go home inspite of heavy assistance required at present.   General Comments  Pt received on 2 L O2 (did not wear at baseline), no SOB noted. pt reports improved since thoracentesis. Educated pt on SNF rehab with pt reporting bad experiences from previous SNF rehab attempts. pt reports her cousin is close to pt's age and size, but that she is "strong". Educated pt on extensive assist required to stand and safety concerns    Exercises     Shoulder Instructions      Home Living Family/patient expects to be discharged to:: Private residence Living Arrangements: Children;Other relatives (son, cousin) Available Help at Discharge: Family;Available 24 hours/day Type of Home: House Home Access: Stairs to enter [employer] of Steps: 1 Entrance Stairs-Rails: None Home Layout: One level     Bathroom Shower/Tub: Producer, television/film/video: Handicapped height     Home Equipment: Environmental consultant - 4 wheels;Cane - single point;Walker - 2 wheels;Shower seat - built in;Grab bars - tub/shower;Grab bars - toilet   Additional Comments: Pt reports son and cousin both work, but work alternating schedules so pt has someone with her all the time      Prior Functioning/Environment Level of Independence: Needs assistance  Gait / Transfers Assistance Needed: Pt states she uses rollator in house and onto driveway ADL's / Homemaking Assistance Needed: Pt reports assistance  from cousin with bathing, dressing as needed. Has assistance walking to bathroom, uses BSC day/night prn            OT Problem List: Decreased strength;Decreased activity tolerance;Impaired balance (sitting and/or standing);Decreased safety awareness;Decreased knowledge of use of DME or AE;Cardiopulmonary status limiting activity      OT Treatment/Interventions: Self-care/ADL training;Therapeutic exercise;Energy conservation;DME and/or AE instruction;Therapeutic activities;Visual/perceptual remediation/compensation    OT Goals(Current goals can be found in the care plan section) Acute Rehab OT Goals Patient Stated Goal: to go home OT Goal Formulation: With patient Time For Goal Achievement: 06/12/20 Potential to Achieve Goals: Fair ADL Goals Pt Will Transfer to Toilet: with mod assist;stand pivot transfer;bedside commode Pt Will Perform Toileting - Clothing Manipulation and hygiene: with mod assist;sit to/from stand Pt/caregiver will Perform Home Exercise Program: Increased strength;Both right and left upper extremity;With theraband;With Supervision;With written HEP provided Additional ADL Goal #1: Pt to demonstrate ability to complete at least 3 sit to stand transfers using LRAD with no more than Min A in preparation for ADL transfers Additional ADL Goal #2: Pt to demonstrate ability to maintain sitting balance during ADLs for 3-5 minutes with no more than min guard.  OT Frequency:  Min 2X/week   Barriers to D/C:            Co-evaluation              AM-PAC OT "6 Clicks" Daily Activity     Outcome Measure Help from another person eating meals?: A Little Help from another person taking care of personal grooming?: A Little Help from another person toileting, which includes using toliet, bedpan, or urinal?: Total Help from another person bathing (including washing, rinsing, drying)?: A Lot Help from another person to put on and taking off regular upper body clothing?: A  Little Help from another person to put on and taking off regular lower body clothing?: Total 6 Click Score: 13   End of Session Equipment Utilized During Treatment: Gait belt;Rolling walker;Oxygen Nurse Communication: Mobility status  Activity Tolerance: Patient tolerated treatment well Patient left: in bed;with call bell/phone within reach;with bed alarm set  OT Visit Diagnosis: Unsteadiness on feet (R26.81);Other abnormalities of gait and mobility (R26.89);History of falling (Z91.81);Muscle weakness (generalized) (M62.81)                Time: 2841-3244 OT Time Calculation (min): 32 min Charges:  OT General Charges $OT Visit: 1 Visit OT Evaluation $OT Eval Moderate Complexity: 1 Mod OT Treatments $Therapeutic Activity: 8-22 mins  Lorre Munroe, OTR/L  Lorre Munroe 05/29/2020, 2:44 PM

## 2020-05-30 ENCOUNTER — Inpatient Hospital Stay (HOSPITAL_COMMUNITY): Payer: Medicare Other

## 2020-05-30 LAB — RENAL FUNCTION PANEL
Albumin: 2.4 g/dL — ABNORMAL LOW (ref 3.5–5.0)
Anion gap: 9 (ref 5–15)
BUN: 32 mg/dL — ABNORMAL HIGH (ref 8–23)
CO2: 33 mmol/L — ABNORMAL HIGH (ref 22–32)
Calcium: 8.4 mg/dL — ABNORMAL LOW (ref 8.9–10.3)
Chloride: 93 mmol/L — ABNORMAL LOW (ref 98–111)
Creatinine, Ser: 0.8 mg/dL (ref 0.44–1.00)
GFR calc Af Amer: >60 mL/min (ref >60)
GFR calc non Af Amer: >60 mL/min (ref >60)
Glucose, Bld: 99 mg/dL (ref 70–99)
Phosphorus: 2.5 mg/dL (ref 2.5–4.6)
Potassium: 4.1 mmol/L (ref 3.5–5.1)
Sodium: 135 mmol/L (ref 135–145)

## 2020-05-30 LAB — CBC
HCT: 24.4 % — ABNORMAL LOW (ref 36.0–46.0)
Hemoglobin: 7.4 g/dL — ABNORMAL LOW (ref 12.0–15.0)
MCH: 25.2 pg — ABNORMAL LOW (ref 26.0–34.0)
MCHC: 30.3 g/dL (ref 30.0–36.0)
MCV: 83 fL (ref 80.0–100.0)
Platelets: 237 10*3/uL (ref 150–400)
RBC: 2.94 MIL/uL — ABNORMAL LOW (ref 3.87–5.11)
RDW: 16.6 % — ABNORMAL HIGH (ref 11.5–15.5)
WBC: 8.1 10*3/uL (ref 4.0–10.5)
nRBC: 0 % (ref 0.0–0.2)

## 2020-05-30 LAB — ABO/RH: ABO/RH(D): O POS

## 2020-05-30 LAB — PH, BODY FLUID: pH, Body Fluid: 7.6

## 2020-05-30 LAB — MAGNESIUM: Magnesium: 1.7 mg/dL (ref 1.7–2.4)

## 2020-05-30 LAB — HEMOGLOBIN AND HEMATOCRIT, BLOOD
HCT: 31.1 % — ABNORMAL LOW (ref 36.0–46.0)
Hemoglobin: 9.7 g/dL — ABNORMAL LOW (ref 12.0–15.0)

## 2020-05-30 LAB — PREPARE RBC (CROSSMATCH)

## 2020-05-30 MED ORDER — APIXABAN 5 MG PO TABS
5.0000 mg | ORAL_TABLET | Freq: Two times a day (BID) | ORAL | Status: DC
  Administered 2020-05-30 – 2020-05-31 (×3): 5 mg via ORAL
  Filled 2020-05-30 (×3): qty 1

## 2020-05-30 MED ORDER — ENSURE ENLIVE PO LIQD
237.0000 mL | Freq: Two times a day (BID) | ORAL | Status: DC
  Administered 2020-05-30 – 2020-05-31 (×3): 237 mL via ORAL

## 2020-05-30 MED ORDER — ADULT MULTIVITAMIN W/MINERALS CH
1.0000 | ORAL_TABLET | Freq: Every day | ORAL | Status: DC
  Administered 2020-05-30 – 2020-05-31 (×2): 1 via ORAL
  Filled 2020-05-30: qty 1

## 2020-05-30 MED ORDER — FUROSEMIDE 40 MG PO TABS
40.0000 mg | ORAL_TABLET | Freq: Two times a day (BID) | ORAL | Status: DC
  Administered 2020-05-30 – 2020-05-31 (×4): 40 mg via ORAL
  Filled 2020-05-30 (×4): qty 1

## 2020-05-30 MED ORDER — SODIUM CHLORIDE 0.9% IV SOLUTION: Freq: Once | INTRAVENOUS | Status: AC

## 2020-05-30 MED ORDER — IRBESARTAN 150 MG PO TABS
150.0000 mg | ORAL_TABLET | Freq: Every day | ORAL | Status: DC
  Administered 2020-05-30 – 2020-05-31 (×2): 150 mg via ORAL
  Filled 2020-05-30 (×2): qty 1

## 2020-05-30 NOTE — Progress Notes (Signed)
Initial Nutrition Assessment  DOCUMENTATION CODES:   Severe malnutrition in context of chronic illness  INTERVENTION:   Liberalize diet to REGULAR   Ensure Enlive po BID, each supplement provides 350 kcal and 20 grams of protein  MVI daily  NUTRITION DIAGNOSIS:   Severe Malnutrition related to chronic illness (COPD/CHF) as evidenced by severe fat depletion, severe muscle depletion.  GOAL:   Patient will meet greater than or equal to 90% of their needs  MONITOR:   PO intake, Supplement acceptance, Weight trends, Labs, I & O's, Skin  REASON FOR ASSESSMENT:   Malnutrition Screening Tool    ASSESSMENT:   Patient with PMH significant for COPD, CHF, PE, HTN, and DM. Presents this admission with CHF exacerbation and R PE.   Pt endorses not eating anything over the last week due to breathing difficulties. States prior to this she was eating well, 1-2 meals daily. Meals consisted Ensure for breakfast, meat with vegetable for lunch, and meat with vegetable, grain for dinner. Pt states she substituted Ensure for meals when she couldn't complete them. Appetite better this admission. No meal completions charted. Rd to provide supplementation to maximize kcal and protein this admission.   Pt reports a UBW of 140 lb and is unsure is she has lost weight. Records show pt weighed 159 lb on 2/27 and 132 lb this admission. Unable to differentiate between dry wt loss and fluid fluctuation given history of CHF.   Medications: 40 mg lasix BID, metformin, 20 mEq KCl daily, Vit B12 Labs: CBG 99-107  NUTRITION - FOCUSED PHYSICAL EXAM:    Most Recent Value  Orbital Region Mild depletion  Upper Arm Region Severe depletion  Thoracic and Lumbar Region Unable to assess  Buccal Region Severe depletion  Temple Region Severe depletion  Clavicle Bone Region Severe depletion  Clavicle and Acromion Bone Region Severe depletion  Scapular Bone Region Unable to assess  Dorsal Hand Severe depletion   Patellar Region Severe depletion  Anterior Thigh Region Severe depletion  Posterior Calf Region Severe depletion  Edema (RD Assessment) Mild  Hair Reviewed  Eyes Reviewed  Mouth Reviewed  Skin Reviewed  Nails Reviewed     Diet Order:   Diet Order            Diet heart healthy/carb modified Room service appropriate? No; Fluid consistency: Thin  Diet effective now                 EDUCATION NEEDS:   Education needs have been addressed  Skin:  Skin Assessment: Skin Integrity Issues: Skin Integrity Issues:: Unstageable Unstageable: L heel  Last BM:  6/28  Height:   Ht Readings from Last 1 Encounters:  05/27/20 5\' 6"  (1.676 m)    Weight:   Wt Readings from Last 1 Encounters:  05/30/20 60 kg    BMI:  Body mass index is 21.35 kg/m.  Estimated Nutritional Needs:   Kcal:  1600-1800 kcal  Protein:  85-100 grams  Fluid:  >/= 1.6 L/day   06/01/20 RD, LDN Clinical Nutrition Pager listed in [facility]

## 2020-05-30 NOTE — Care Management (Signed)
    Durable Medical Equipment  (From admission, onward)         Start     Ordered   05/29/20 1423  For home use only DME Hospital bed  Once       Question Answer Comment  Length of Need Lifetime   Head must be elevated greater than: 30 degrees   Bed type Semi-electric   [employer] Yes   Trapeze Bar Yes   Support Surface: Gel Overlay      05/29/20 1422

## 2020-05-30 NOTE — Progress Notes (Signed)
Physical Therapy Treatment Patient Details Name: Cheryl Atkinson MRN: [id number] DOB: [birth year] Today's Date: 05/30/2020    History of Present Illness Patient is an 86 year old female with a history of atrial fibrillation, hypertension, COPD, CHF, prediabetes ,   Lap chole 04/05/2020 readmission 5/6 through 04/17/2020 lethargy confusion 2/2 opiate narcosis. DC to SNF, then home 05/11/20. Comes to ED 05/23/20 with HTN , SOB, acute exacerbation of CHF    PT Comments    Pt agreeable to OOB mobility this day, requiring mod-max assist for bed mobility and transfer OOB. Pt continuing to present with significant LE weakness and poor sitting and standing balance, and is very high fall risk. Pt administered LE exercises to address weakness and improve functional mobility, which pt tolerated well. Most appropriate d/c plan remains SNF in this PT's opinion, but pt is insistent on d/c home with assist of her cousin. Per pt, her cousin can provide as much physical assistance as this PT did today. PT to continue to follow acutely, will progress mobility as able.    Follow Up Recommendations  SNF;Supervision/Assistance - 24 hour (pt and family with recent SNF refusal)     Equipment Recommendations  None recommended by PT    Recommendations for Other Services       Precautions / Restrictions Precautions Precautions: Fall Restrictions Weight Bearing Restrictions: No    Mobility  Bed Mobility Overal bed mobility: Needs Assistance Bed Mobility: Sidelying to Sit;Rolling Rolling: Mod assist Sidelying to sit: Max assist;HOB elevated       General bed mobility comments: Mod for roll to R for trunk and LE translation, max assist for sidelying to sit for trunk and LE management, scooting to EOB with use of lateral weight shifting and bed pads. Heavy posterior leaning requiring PT assist to correct and cues for hand placement on EOB/bedrails.  Transfers Overall transfer level: Needs assistance Equipment  used: Rolling walker (2 wheeled) Transfers: Sit to/from [employer] Sit to Stand: Mod assist;From elevated surface Stand pivot transfers: Mod assist;From elevated surface       General transfer comment: Mod assist for power up, hip extension to neutral, steadying upon standing. Verbal cuing for hand placement when rising, hips forward, and chest up. Mod assist for stand pivot to recliner towards pt R for steadying, guiding pt hips to destination, physically moving and stabilizing RW as needed, and slow eccentric lower into chair.  Ambulation/Gait             General Gait Details: unable   Stairs             Wheelchair Mobility    Modified Rankin (Stroke Patients Only)       Balance Overall balance assessment: Needs assistance;History of Falls Sitting-balance support: Bilateral upper extremity supported;Feet supported Sitting balance-Leahy Scale: Poor Sitting balance - Comments: heavy posterior leaning requiring min assist to correct. Improved sitting balance with bilateral UE and LE contact on EOB and ground respectively   Standing balance support: Bilateral upper extremity supported Standing balance-Leahy Scale: Poor Standing balance comment: heavy reliance on PT for steadying in standing                            Cognition Arousal/Alertness: Awake/alert Behavior During Therapy: WFL for tasks assessed/performed Overall Cognitive Status: No family/caregiver present to determine baseline cognitive functioning  General Comments: lacks insight into signficant mobility deficits, requires multimodal cuing for mobility      Exercises General Exercises - Lower Extremity Long Arc Quad: AROM;Both;10 reps;Seated Heel Slides: AROM;Both;5 reps;Supine Hip ABduction/ADduction: AROM;Both;Supine;10 reps Straight Leg Raises: AROM;Both;5 reps;Supine Hip Flexion/Marching: AROM;Both;10 reps;Supine     General Comments General comments (skin integrity, edema, etc.): vss      Pertinent Vitals/Pain Pain Assessment: No/denies pain    Home Living                      Prior Function            PT Goals (current goals can now be found in the care plan section) Acute Rehab PT Goals Patient Stated Goal: to go home PT Goal Formulation: With patient Time For Goal Achievement: 06/07/20 Potential to Achieve Goals: Fair Progress towards PT goals: Progressing toward goals    Frequency    Min 3X/week      PT Plan Current plan remains appropriate    Co-evaluation              AM-PAC PT "6 Clicks" Mobility   Outcome Measure  Help needed turning from your back to your side while in a flat bed without using bedrails?: A Lot Help needed moving from lying on your back to sitting on the side of a flat bed without using bedrails?: A Lot Help needed moving to and from a bed to a chair (including a wheelchair)?: A Lot Help needed standing up from a chair using your arms (e.g., wheelchair or bedside chair)?: A Lot Help needed to walk in hospital room?: Total Help needed climbing 3-5 steps with a railing? : Total 6 Click Score: 10    End of Session Equipment Utilized During Treatment: Gait belt Activity Tolerance: Patient tolerated treatment well;Patient limited by fatigue Patient left: in chair;with chair alarm set;with call bell/phone within reach Nurse Communication: Mobility status PT Visit Diagnosis: Muscle weakness (generalized) (M62.81);Unsteadiness on feet (R26.81)     Time: 2878-6767 PT Time Calculation (min) (ACUTE ONLY): 24 min  Charges:  $Therapeutic Exercise: 8-22 mins $Therapeutic Activity: 8-22 mins                    Dolly Harbach E, PT Acute Rehabilitation Services Pager [phone]  Office [phone]   Bell Carbo D Despina Hidden 05/30/2020, 4:12 PM

## 2020-05-30 NOTE — Progress Notes (Signed)
PROGRESS NOTE  Cheryl Atkinson WHQ:[id number] DOB: Apr 28, 1931   PCP: Shelva Majestic, MD  Patient is from: Home. Wheelchair dependent at baseline.  DOA: 05/27/2020 LOS: 2  Brief Narrative / Interim history: 86 year old female with history of COPD not on oxygen, diastolic CHF, pleural effusion, HTN, controlled NIDDM-2, A. fib on Eliquis, Imdur, debility and recent hospitalization for respiratory failure, right pleural effusion and CHF brought back to ED by EMS due to worsening shortness of breath and hypoxemia. Reportedly saturating at 88% on RA when EMS arrived requiring 2 L to recover to lower 90s.  In ED, hemodynamically stable. Checks x-ray with right pleural effusion and CHF. BNP 218. CO2 37. Pro-Cal negative. Hgb 8.9 (baseline). WBC 9.3. COVID-19 PCR negative. She was admitted for overnight observation for acute respiratory failure, right pleural effusion and diastolic CHF exacerbation. Started on IV Lasix.  The next day, she underwent thoracocentesis with removal of 900 cc pleural fluid.  Continued on IV Lasix with improvement in her breathing.  Eventually weaned off oxygen.  Also transitioned to p.o. Lasix.   Plan is to discharge home with home health and DME (hospital bed) on 05/31/2020 as long as H&H is stable.  H&H and DME has already been ordered.  Subjective: Seen and examined earlier this morning.  No major events overnight or this morning.  No complaints.  Feels better.  She denies chest pain, dyspnea, cough, GI or UTI symptoms.  H&H relatively stable.  Agreeable to blood transfusion.  Sister at bedside.  Objective: Vitals:   05/29/20 1543 05/29/20 2122 05/30/20 0500 05/30/20 0803  BP: (!) 103/49 (!) 99/50  130/64  Pulse: 63 60  64  Resp: 18     Temp: 97.6 F (36.4 C) 97.7 F (36.5 C)  97.9 F (36.6 C)  TempSrc:  Oral    SpO2: 97% 95%  97%  Weight:   60 kg   Height:        Intake/Output Summary (Last 24 hours) at 05/30/2020 1333 Last data filed at 05/30/2020 1116  Gross per 24 hour  Intake 120 ml  Output 1075 ml  Net -955 ml   Filed Weights   05/27/20 1704 05/30/20 0500  Weight: 63.3 kg 60 kg    Examination:  GENERAL: No apparent distress.  Nontoxic. HEENT: MMM.  Vision and hearing grossly intact.  NECK: Supple.  No apparent JVD.  RESP: On room air.  No IWOB.  Fair aeration with mild bibasilar crackles. CVS:  RRR. Heart sounds normal.  ABD/GI/GU: BS+. Abd soft, NTND.  MSK/EXT:  Moves extremities. No apparent deformity. No edema.  SKIN: no apparent skin lesion or wound NEURO: Awake, alert and oriented fairly.  No apparent focal neuro deficit. PSYCH: Calm. Normal affect. Procedures:  6/27-right-sided thoracocentesis with removal of 900 cc fluid  Microbiology summarized: COVID-19 PCR negative.  Assessment & Plan: Acute respiratory failure with hypoxia-likely due to right pleural effusion in the setting of diastolic CHF exacerbation.  Resolved -On room air. -Incentive spirometry/OOB/PT/OT -Treat CHF exacerbation as below.  Acute on chronic diastolic CHF: Echo with EF of 60 to 65%, no RWMA, RVSP of 37 mmHg, severe LAE and moderate RAE.  Responded well to thoracocentesis and diuretics.  I&O incomplete. -Switched to oral Lasix 40 mg twice daily.  She is on 40 mg daily at home. -Closely monitor I&O, renal function and electrolytes  Right pleural effusion: Likely due to CHF. No signs of infections.  No significant effusion on repeat CXR.  -s/p right-sided thoracocentesis with  removal of 900 cc culture-negative transudative fluid. -Diuretics as above.   Paroxysmal A. fib: Rate controlled without meds. -Resume home Eliquis.  Iron deficiency anemia: Baseline Hgb 8-9> 8.9 (admit)> 7.7> 7.9> 7.4>1u..  Iron sat 6%.  TIBC 325.  Ferritin 42.  B12 183.  FOBT negative x2. -Transfuse 1u.  -IV Feraheme on 6/28.  Continue p.o. -IM vitamin B12 on 6/28.  Continue p.o. -Posttransfusion H&H and a.m. CBC  Chronic COPD: Not in exacerbation. Stable.  -Continue breathing treatments  Controlled DM-2: Never had significantly elevated CBG in the last 1 month.  A1c 6.1%. -monitor blood glucose with daily BMP  Essential hypertension: Soft blood pressures. -Decreased Avapro from 300 mg to 150 mg daily -On p.o. Lasix 40 mg twice daily  History of PAD with claudication: Seems she is on Plavix for this but increased risk of bleeding with concurrent use of Eliquis. -Will hold Plavix for now.  May consider discontinuing indefinitely given risk for bleeding with concurrent use of Eliquis  Parkinson's disease? -Continue home Rotigotine patch  Debility/physical deconditioning: Walker and wheelchair dependent at baseline. Recently discharged home from SNF after hospitalization for subdiaphragmatic abscess and completion of antibiotic course -Home health PT/OT and hospital bed ordered.  Goal of care/DNR/DNI-appropriate.   Body mass index is 21.35 kg/m.       Pressure Injury 05/27/20 Heel Left Unstageable - Full thickness tissue loss in which the base of the injury is covered by slough (yellow, tan, gray, green or brown) and/or eschar (tan, brown or black) in the wound bed. (Active)  05/27/20 2215  Location: Heel  Location Orientation: Left  Staging: Unstageable - Full thickness tissue loss in which the base of the injury is covered by slough (yellow, tan, gray, green or brown) and/or eschar (tan, brown or black) in the wound bed.  Wound Description (Comments):   Present on Admission: Yes   DVT prophylaxis:    Code Status: DNR/DNI Family Communication: Updated patient's sister at bedside. Status is: Inpatient  Remains inpatient appropriate because:Ongoing diagnostic testing needed not appropriate for outpatient work up and Inpatient level of care appropriate due to severity of illness to ensure H&H is stable.    Dispo: The patient is from: Home              Anticipated d/c is to: Likely home with home health and DME in the next 24  hours.              Anticipated d/c date is: 1 day              Patient currently is not medically stable to d/c.            Consultants:  IR for thoracocentesis   Sch Meds:  Scheduled Meds: . sodium chloride   Intravenous Once  . apixaban  5 mg Oral BID  . atorvastatin  40 mg Oral Daily  . cyanocobalamin  1,000 mcg Intramuscular Once  . furosemide  40 mg Oral BID  . irbesartan  150 mg Oral Daily  . metFORMIN  500 mg Oral Q breakfast  . potassium chloride SA  20 mEq Oral Daily  . Rotigotine  1 patch Topical QHS  . sodium chloride flush  3 mL Intravenous Q12H  . vitamin B-12  1,000 mcg Oral Daily   Continuous Infusions: . sodium chloride     PRN Meds:.sodium chloride, acetaminophen, albuterol, bisacodyl, ondansetron (ZOFRAN) IV, polyethylene glycol, sodium chloride flush, traZODone  Antimicrobials: Anti-infectives (From admission, onward)  None       I have personally reviewed the following labs and images: CBC: Recent Labs  Lab 05/23/20 1400 05/23/20 1400 05/24/20 1340 05/27/20 1443 05/29/20 0405 05/29/20 1049 05/30/20 0235  WBC 10.1   < > 11.3* 9.3 8.6 8.1 8.1  NEUTROABS 8.5*  --   --   --   --   --   --   HGB 8.5*   < > 9.2* 8.9* 7.7* 7.9* 7.4*  HCT 28.7*   < > 30.6* 29.9* 25.8* 26.8* 24.4*  MCV 86.4   < > 86.7 84.7 85.4 85.9 83.0  PLT 311   < > 261 264 241 246 237   < > = values in this interval not displayed.   BMP &GFR Recent Labs  Lab 05/26/20 0508 05/27/20 1443 05/28/20 0236 05/29/20 0405 05/30/20 0235  NA 141 141 142 143 135  K 3.4* 3.7 4.0 4.2 4.1  CL 98 95* 97* 98 93*  CO2 34* 35* 37* 36* 33*  GLUCOSE 117* 131* 126* 107* 99  BUN 27* 20 21 29* 32*  CREATININE 0.58 0.62 0.69 0.79 0.80  CALCIUM 8.3* 8.8* 8.5* 8.5* 8.4*  MG  --   --   --  1.7 1.7  PHOS  --   --   --  3.3 2.5   Estimated Creatinine Clearance: 47.3 mL/min (by C-G formula based on SCr of 0.8 mg/dL). Liver & Pancreas: Recent Labs  Lab 05/23/20 1400 05/27/20  1443 05/28/20 1416 05/29/20 0405 05/30/20 0235  AST 20 18  --   --   --   ALT 23 19  --   --   --   ALKPHOS 110 89  --   --   --   BILITOT 1.1 1.2  --   --   --   PROT 7.3 6.7 6.4*  --   --   ALBUMIN 3.4* 3.0*  --  2.4* 2.4*   Recent Labs  Lab 05/23/20 1400 05/27/20 1443  LIPASE 20 23   No results for input(s): AMMONIA in the last 168 hours. Diabetic: Recent Labs    05/29/20 0405  HGBA1C 6.1*   No results for input(s): GLUCAP in the last 168 hours. Cardiac Enzymes: No results for input(s): CKTOTAL, CKMB, CKMBINDEX, TROPONINI in the last 168 hours. Recent Labs    02/11/20 0936  PROBNP 228.0*   Coagulation Profile: No results for input(s): INR, PROTIME in the last 168 hours. Thyroid Function Tests: No results for input(s): TSH, T4TOTAL, FREET4, T3FREE, THYROIDAB in the last 72 hours. Lipid Profile: No results for input(s): CHOL, HDL, LDLCALC, TRIG, CHOLHDL, LDLDIRECT in the last 72 hours. Anemia Panel: Recent Labs    05/29/20 0405  VITAMINB12 183  FOLATE 17.2  FERRITIN 42  TIBC 325  IRON 20*  RETICCTPCT 2.5   Urine analysis:    Component Value Date/Time   COLORURINE YELLOW 05/23/2020 1640   APPEARANCEUR CLEAR 05/23/2020 1640   LABSPEC 1.014 05/23/2020 1640   PHURINE 6.0 05/23/2020 1640   GLUCOSEU NEGATIVE 05/23/2020 1640   HGBUR NEGATIVE 05/23/2020 1640   BILIRUBINUR NEGATIVE 05/23/2020 1640   BILIRUBINUR Negative 02/15/2020 1052   KETONESUR NEGATIVE 05/23/2020 1640   PROTEINUR >=300 (A) 05/23/2020 1640   UROBILINOGEN 0.2 02/15/2020 1052   UROBILINOGEN 0.2 08/20/2019 1732   NITRITE NEGATIVE 05/23/2020 1640   LEUKOCYTESUR NEGATIVE 05/23/2020 1640   Sepsis Labs: Invalid input(s): PROCALCITONIN, LACTICIDVEN  Microbiology: Recent Results (from the past 240 hour(s))  Culture, blood (routine x  2)     Status: None   Collection Time: 05/23/20  2:00 PM   Specimen: BLOOD RIGHT ARM  Result Value Ref Range Status   Specimen Description   Final    BLOOD  RIGHT ARM Performed at [facility], [address], [place], [place] [address]    Special Requests   Final    BOTTLES DRAWN AEROBIC AND ANAEROBIC Blood Culture adequate volume Performed at [facility], [address], [place], [place] [address]    Culture   Final    NO GROWTH 5 DAYS Performed at [facility], [address] [place], [place] [address]    Report Status 05/28/2020 FINAL  Final  SARS Coronavirus 2 by RT PCR (hospital order, performed in [facility] hospital lab) Nasopharyngeal Nasopharyngeal Swab     Status: None   Collection Time: 05/23/20  2:24 PM   Specimen: Nasopharyngeal Swab  Result Value Ref Range Status   SARS Coronavirus 2 NEGATIVE NEGATIVE Final    Comment: (NOTE) SARS-CoV-2 target nucleic acids are NOT DETECTED.  The SARS-CoV-2 RNA is generally detectable in upper and lower respiratory specimens during the acute phase of infection. The lowest concentration of SARS-CoV-2 viral copies this assay can detect is 250 copies / mL. A negative result does not preclude SARS-CoV-2 infection and should not be used as the sole basis for treatment or other patient management decisions.  A negative result may occur with improper specimen collection / handling, submission of specimen other than nasopharyngeal swab, presence of viral mutation(s) within the areas targeted by this assay, and inadequate number of viral copies (<250 copies / mL). A negative result must be combined with clinical observations, patient history, and epidemiological information.  Fact Sheet for Patients:   BoilerBrush.com.cy  Fact Sheet for Healthcare Providers: https://pope.com/  This test is not yet approved or  cleared by the [place] FDA and has been authorized for detection and/or diagnosis of SARS-CoV-2 by FDA under an Emergency Use Authorization (EUA).  This EUA will remain in effect (meaning this test  can be used) for the duration of the COVID-19 declaration under Section 564(b)(1) of the Act, 21 U.S.C. section 360bbb-3(b)(1), unless the authorization is terminated or revoked sooner.  Performed at [facility], [address], [place], [place] [address]   Urine culture     Status: Abnormal   Collection Time: 05/23/20  4:40 PM   Specimen: Urine, Clean Catch  Result Value Ref Range Status   Specimen Description   Final    URINE, CLEAN CATCH Performed at [facility], [address], [place], [place] [address]    Special Requests   Final    NONE Performed at [facility], [address] [place], [place] [address]    Culture MULTIPLE SPECIES PRESENT, SUGGEST RECOLLECTION (A)  Final   Report Status 05/24/2020 FINAL  Final  SARS Coronavirus 2 by RT PCR (hospital order, performed in [facility] hospital lab) Nasopharyngeal Nasopharyngeal Swab     Status: None   Collection Time: 05/27/20  5:11 PM   Specimen: Nasopharyngeal Swab  Result Value Ref Range Status   SARS Coronavirus 2 NEGATIVE NEGATIVE Final    Comment: (NOTE) SARS-CoV-2 target nucleic acids are NOT DETECTED.  The SARS-CoV-2 RNA is generally detectable in upper and lower respiratory specimens during the acute phase of infection. The lowest concentration of SARS-CoV-2 viral copies this assay can detect is 250 copies / mL. A  negative result does not preclude SARS-CoV-2 infection and should not be used as the sole basis for treatment or other patient management decisions.  A negative result may occur with improper specimen collection / handling, submission of specimen other than nasopharyngeal swab, presence of viral mutation(s) within the areas targeted by this assay, and inadequate number of viral copies (<250 copies / mL). A negative result must be combined with clinical observations, patient history, and epidemiological information.  Fact Sheet for Patients:    BoilerBrush.com.cy  Fact Sheet for Healthcare Providers: https://pope.com/  This test is not yet approved or  cleared by the [place] FDA and has been authorized for detection and/or diagnosis of SARS-CoV-2 by FDA under an Emergency Use Authorization (EUA).  This EUA will remain in effect (meaning this test can be used) for the duration of the COVID-19 declaration under Section 564(b)(1) of the Act, 21 U.S.C. section 360bbb-3(b)(1), unless the authorization is terminated or revoked sooner.  Performed at [facility], [address], [place], [place] [address]   Gram stain     Status: None   Collection Time: 05/28/20  9:44 AM   Specimen: Pleura  Result Value Ref Range Status   Specimen Description PLEURAL FLUID  Final   Special Requests NONE  Final   Gram Stain   Final    WBC PRESENT,BOTH PMN AND MONONUCLEAR NO ORGANISMS SEEN CYTOSPIN SMEAR Performed at [facility], [address] [place], [place] [address]    Report Status 05/28/2020 FINAL  Final    Radiology Studies: DG Chest Port 1 View  Result Date: 05/30/2020 CLINICAL DATA:  Weakness, shortness of breath today, hypoxia, history CHF, COPD, hypertension EXAM: PORTABLE CHEST 1 VIEW COMPARISON:  Portable exam 0811 hours compared to 05/28/2020 FINDINGS: Enlargement of cardiac silhouette. Rotated to the RIGHT. Mediastinal contours and pulmonary vascularity normal. Atherosclerotic calcification aorta. Mild RIGHT basilar infiltrate question pneumonia. Remaining lungs clear. Small LEFT pleural effusion not excluded. No pneumothorax. Bones demineralized with probable enchondroma proximal RIGHT humerus. IMPRESSION: Suspicion of developing infiltrate at RIGHT base. Cannot exclude small LEFT pleural effusion. Electronically Signed   By: Ulyses Southward M.D.   On: 05/30/2020 08:21      Taye T[facility]  If 7PM-7AM, please contact night-coverage  [email or web address] Password [email or web address] 05/30/2020, 1:33 PM

## 2020-05-30 NOTE — Plan of Care (Signed)

## 2020-05-30 NOTE — Care Management (Addendum)
LVM w son to discuss DC plan. Bady,Greg Son [phone]  [phone]   Tammy Sours called back and confirmed that they live together. He states that he is agreeable to either home w home health (already establish with [facility]) or SNF, he would let her decide. Tammy Sours confirmed that a hospital bed would be needed if patient returned home. Spoke w patent and she would like to go home at DC. Hospital bed ordered through [facility], and [facility] notified of DC plan.

## 2020-05-31 ENCOUNTER — Inpatient Hospital Stay (HOSPITAL_COMMUNITY): Payer: Medicare Other

## 2020-05-31 DIAGNOSIS — J9602 Acute respiratory failure with hypercapnia: Secondary | ICD-10-CM

## 2020-05-31 DIAGNOSIS — E43 Unspecified severe protein-calorie malnutrition: Secondary | ICD-10-CM | POA: Insufficient documentation

## 2020-05-31 LAB — RENAL FUNCTION PANEL
Albumin: 2.5 g/dL — ABNORMAL LOW (ref 3.5–5.0)
Anion gap: 10 (ref 5–15)
BUN: 35 mg/dL — ABNORMAL HIGH (ref 8–23)
CO2: 31 mmol/L (ref 22–32)
Calcium: 8.5 mg/dL — ABNORMAL LOW (ref 8.9–10.3)
Chloride: 95 mmol/L — ABNORMAL LOW (ref 98–111)
Creatinine, Ser: 0.9 mg/dL (ref 0.44–1.00)
GFR calc Af Amer: >60 mL/min (ref >60)
GFR calc non Af Amer: 58 mL/min — ABNORMAL LOW (ref >60)
Glucose, Bld: 100 mg/dL — ABNORMAL HIGH (ref 70–99)
Phosphorus: 2.5 mg/dL (ref 2.5–4.6)
Potassium: 4.3 mmol/L (ref 3.5–5.1)
Sodium: 136 mmol/L (ref 135–145)

## 2020-05-31 LAB — CBC
HCT: 29.4 % — ABNORMAL LOW (ref 36.0–46.0)
Hemoglobin: 9.2 g/dL — ABNORMAL LOW (ref 12.0–15.0)
MCH: 25.9 pg — ABNORMAL LOW (ref 26.0–34.0)
MCHC: 31.3 g/dL (ref 30.0–36.0)
MCV: 82.8 fL (ref 80.0–100.0)
Platelets: 233 10*3/uL (ref 150–400)
RBC: 3.55 MIL/uL — ABNORMAL LOW (ref 3.87–5.11)
RDW: 16.5 % — ABNORMAL HIGH (ref 11.5–15.5)
WBC: 8.3 10*3/uL (ref 4.0–10.5)
nRBC: 0 % (ref 0.0–0.2)

## 2020-05-31 LAB — TYPE AND SCREEN
ABO/RH(D): O POS
Antibody Screen: NEGATIVE
Unit division: 0

## 2020-05-31 LAB — BPAM RBC
Blood Product Expiration Date: 202108022359
ISSUE DATE / TIME: 202106291534
Unit Type and Rh: 5100

## 2020-05-31 LAB — MAGNESIUM: Magnesium: 1.7 mg/dL (ref 1.7–2.4)

## 2020-05-31 MED ORDER — FUROSEMIDE 40 MG PO TABS: 40.0000 mg | ORAL_TABLET | Freq: Every day | ORAL | 0 refills | Status: DC

## 2020-05-31 NOTE — Plan of Care (Signed)

## 2020-05-31 NOTE — Discharge Summary (Signed)
Physician Discharge Summary  Cheryl Atkinson ZOX:[id number] DOB: [birth year] DOA: 05/27/2020  PCP: Shelva Majestic, MD  Admit date: 05/27/2020 Discharge date: 05/31/2020  Admitted From: Home Disposition:  Home  Recommendations for Outpatient Follow-up:  1. Follow up with PCP in 1-2 weeks 2. Please obtain BMP/CBC in one week, discussed with the patient as she is at high risk of bleeding while she is on Plavix and Eliquis and discussed the need to discontinue Plavix as an outpatient.   Home Health:Yes Equipment/Devices:None  Discharge Condition:stable CODE STATUS: DNR Diet recommendation: Heart Healthy   Brief/Interim Summary: 86 year old with past medical history of COPD not on oxygen, chronic diastolic heart failure pleural effusion diabetes mellitus type 2 A. fib on Eliquis recently discharged from the hospital for respiratory failure with a right pleural effusion brought back by EMS for worsening shortness of breath satting in the 80s.  Discharge Diagnoses:  Principal Problem:   Acute respiratory failure with hypoxia (HCC) Active Problems:   Essential hypertension   COPD (chronic obstructive pulmonary disease) (HCC)   Diabetes mellitus (HCC)   Atrial fibrillation (HCC)   Acute respiratory failure (HCC)   Acute on chronic diastolic CHF (congestive heart failure) (HCC)   Pleural effusion due to CHF (congestive heart failure) (HCC)   Protein-calorie malnutrition, severe  Acute respiratory failure with hypoxia likely due to acute diastolic heart failure and right-sided pleural effusion: She was diuresed, I's and O's are poorly recorded. IR was consulted and thoracocentesis was performed the ER 900 cc culture negative fluid likely transudate. Once she was off oxygen her Lasix was changed to oral Lasix which should continue twice a day at home.  Paroxysmal atrial fibrillation: Rate controlled without any medications continue Eliquis.  Iron deficiency anemia: With a baseline  hemoglobin of around 9, on admission it was 6 she was transfused 1 unit of packed red blood cells and her hemoglobin remained stable. She was given a dose of IV Feraheme, she will continue oral ferrous sulfate at home.  COPD: Stable no changes.  Diabetes mellitus type 2: With an A1c of 6.1, currently stable no change made to her medication.  Essential hypertension: Continue Avapro and Lasix outpatient.  History of PAD with claudication: At high bleeding risk as she is on Plavix and Eliquis.  Will need to be evaluated by her PCP to see if Eliquis can be stopped as an outpatient.  Parkinson's disease: Continue patch.  Physical deconditioning: Physical therapy evaluated the patient she was recently discharged from skilled nursing facility and they recommended home health PT.     Discharge Instructions  Discharge Instructions    Diet - low sodium heart healthy   Complete by: As directed    Increase activity slowly   Complete by: As directed    No wound care   Complete by: As directed      Allergies as of 05/31/2020      Reactions   Codeine Other (See Comments)   Syncope       Medication List    TAKE these medications   Albuterol Sulfate 108 (90 Base) MCG/ACT Aepb Commonly known as: ProAir RespiClick Inhale 2 puffs into the lungs every 6 (six) hours as needed (shortness of breath from COPD).   apixaban 5 MG Tabs tablet Commonly known as: Eliquis Take 1 tablet (5 mg total) by mouth 2 (two) times daily. Start 04/06/20   atorvastatin 40 MG tablet Commonly known as: LIPITOR Take 1 tablet (40 mg total) by mouth daily.  bisacodyl 10 MG suppository Commonly known as: DULCOLAX Place 1 suppository (10 mg total) rectally daily as needed for moderate constipation.   clopidogrel 75 MG tablet Commonly known as: PLAVIX Take 1 tablet (75 mg total) by mouth daily. Start 04/06/20   furosemide 40 MG tablet Commonly known as: LASIX Take 1 tablet (40 mg total) by mouth daily.    irbesartan 300 MG tablet Commonly known as: AVAPRO Take 1 tablet (300 mg total) by mouth daily.   metFORMIN 500 MG tablet Commonly known as: GLUCOPHAGE Take 500 mg by mouth daily.   Neupro 3 MG/24HR Pt24 Generic drug: Rotigotine PLACE 1 PATCH (3 MG) ONTO THE SKIN AT BEDTIME What changed: See the new instructions.   polyethylene glycol 17 g packet Commonly known as: MIRALAX / GLYCOLAX Take 17 g by mouth 2 (two) times daily. Reported on 03/14/2016 What changed:   when to take this  reasons to take this  additional instructions   potassium chloride SA 20 MEQ tablet Commonly known as: KLOR-CON Take 1 tablet (20 mEq total) by mouth daily. Needs to be taken on days Lasix is taken   PRESERVISION AREDS 2 PO Take 1 tablet by mouth 2 (two) times daily.   traZODone 50 MG tablet Commonly known as: DESYREL Take 1.5 tablets (75 mg total) by mouth at bedtime as needed for sleep.            Durable Medical Equipment  (From admission, onward)         Start     Ordered   05/29/20 1423  For home use only DME Hospital bed  Once       Question Answer Comment  Length of Need Lifetime   Head must be elevated greater than: 30 degrees   Bed type Semi-electric   Hoyer Lift Yes   Trapeze Bar Yes   Support Surface: Gel Overlay      05/29/20 1422          Allergies  Allergen Reactions  . Codeine Other (See Comments)    Syncope     Consultations:  None   Procedures/Studies: DG Chest 1 View  Result Date: 05/28/2020 CLINICAL DATA:  Post RIGHT thoracentesis EXAM: CHEST  1 VIEW COMPARISON:  05/27/2020 FINDINGS: Minimal enlargement of cardiac silhouette. Rotated to the RIGHT. Mediastinal contours and pulmonary vascularity normal. Atherosclerotic calcification aorta. Resolution of RIGHT pleural effusion and basilar atelectasis post thoracentesis. No pneumothorax. Minimal persistent LEFT basilar atelectasis. IMPRESSION: No pneumothorax following RIGHT thoracentesis. Aortic  Atherosclerosis (ICD10-I70.0). Electronically Signed   By: Ulyses Southward M.D.   On: 05/28/2020 10:09   DG Chest 2 View  Result Date: 05/27/2020 CLINICAL DATA:  Increasing shortness of breath with nausea and diarrhea. Recent hospitalization. EXAM: CHEST - 2 VIEW COMPARISON:  Radiograph 05/25/2020 and 05/23/2020.  CT 04/11/2020. FINDINGS: Stable cardiomegaly, aortic atherosclerosis and ectasia. A moderate size right pleural effusion may be partially loculated posteriorly. There are associated right-greater-than-left basilar airspace opacities. Overall findings are similar to the most recent study. No pneumothorax. Stable chondroid lesion in the proximal right humerus. IMPRESSION: Persistent right pleural effusion and right-greater-than-left basilar airspace opacities suspicious for pneumonia. No significant change from previous study of 2 days ago. Electronically Signed   By: Carey Bullocks M.D.   On: 05/27/2020 15:28   DG Chest 2 View  Result Date: 05/25/2020 CLINICAL DATA:  History of COPD. EXAM: CHEST - 2 VIEW COMPARISON:  May 25, 2020 (8:11 a.m.) FINDINGS: Stable moderate severity atelectasis and/or infiltrate is seen  within the right lower lobe. There is a stable, small to moderate size right pleural effusion. No pneumothorax is identified. The cardiac silhouette is moderately enlarged. A stable lobulated sclerotic area is seen within the proximal right humerus. Multilevel degenerative changes seen throughout the thoracic spine. IMPRESSION: 1. Small to moderate-sized right pleural effusion. 2. Stable right basilar consolidation. Electronically Signed   By: Aram Candela M.D.   On: 05/25/2020 15:18   CT ABDOMEN PELVIS W CONTRAST  Result Date: 05/16/2020 CLINICAL DATA:  86 year old female with perihepatic fluid collection, status post previous CT-guided drainage 04/12/2020. Prior surgery 04/04/2020 EXAM: CT ABDOMEN AND PELVIS WITH CONTRAST TECHNIQUE: Multidetector CT imaging of the abdomen and  pelvis was performed using the standard protocol following bolus administration of intravenous contrast. CONTRAST:  ISOVUE-300 IOPAMIDOL (ISOVUE-300) INJECTION 61% COMPARISON:  04/26/2020, 04/12/2020 FINDINGS: Lower chest: Right-sided pleural effusion with atelectasis. Trace left-sided pleural fluid. Coronary artery calcifications. Hepatobiliary: Perihepatic fluid collection persists, with similar appearance of a lentiform fluid collection on the right capsule contributing to some mass effect on the adjacent liver parenchyma. There is perfusion anomaly with preferential enhancement in the liver parenchyma at this site. The pigtail drainage catheter no longer is within the fluid collection. Fluid collection measures 2.9 cm x 4.9 cm. The fluid is in communication through a small tract to the gallbladder fossa. Fluid within the gallbladder floss of persists measuring 3.9 cm by 1.8 cm. Rim enhancement is present about the dumbbell shaped collection. Surgical changes of cholecystectomy. The pigtail drainage catheter is on the skin, likely under a dressing. Pancreas: Atrophic pancreas, similar appearance to prior. No local inflammation. Spleen: Unremarkable spleen Adrenals/Urinary Tract: - Right adrenal gland:  Unremarkable - Left adrenal gland: Unremarkable. - Right kidney: No hydronephrosis, nephrolithiasis, inflammation, or ureteral dilation. No focal lesion. - Left Kidney: No hydronephrosis, nephrolithiasis, inflammation, or ureteral dilation. Hypoenhancing/hypodense structure on the lateral cortex of left kidney is unchanged from the prior, likely benign cyst though incompletely characterized. - Urinary Bladder: Bladder partially distended. Diverticula at the right bladder base. Stomach/Bowel: - Stomach: Unremarkable. - Small bowel: Unremarkable - Appendix: Normal. - Colon: Unremarkable. Vascular/Lymphatic: Atherosclerotic calcifications of the abdominal aorta. Distal aorta measures 2.5 cm. Calcifications  bilateral iliac arteries. Patent stent of the right common iliac artery. Proximal femoral arteries are patent with atherosclerosis. Unremarkable venous structures. Unremarkable portal structures. No periaortic fluid. Calcifications of the mesenteric arteries, which appear patent. Reproductive: Hysterectomy. Other: Mild edema within the mesenteric fat. No ascites. No additional fluid collection. Surgical changes along the midline abdomen. Musculoskeletal: No acute displaced fracture. Osteopenia. Multilevel degenerative changes of the visualized thoracolumbar spine. No bony canal narrowing. Mild degenerative changes of the bilateral hips. IMPRESSION: The previous pigtail drainage catheter within the perihepatic fluid collection has been withdrawn from the body and terminates on the skin. Persisting rim enhancing dumbbell shaped fluid collection along the right aspect of the liver margin, communicating with gallbladder fossa collection. Right greater than left pleural fluid. Aortic Atherosclerosis (ICD10-I70.0). Additional ancillary findings as above. Electronically Signed   By: Gilmer Mor D.O.   On: 05/16/2020 15:56   DG Chest Port 1 View  Result Date: 05/30/2020 CLINICAL DATA:  Weakness, shortness of breath today, hypoxia, history CHF, COPD, hypertension EXAM: PORTABLE CHEST 1 VIEW COMPARISON:  Portable exam 0811 hours compared to 05/28/2020 FINDINGS: Enlargement of cardiac silhouette. Rotated to the RIGHT. Mediastinal contours and pulmonary vascularity normal. Atherosclerotic calcification aorta. Mild RIGHT basilar infiltrate question pneumonia. Remaining lungs clear. Small LEFT pleural effusion not excluded.  No pneumothorax. Bones demineralized with probable enchondroma proximal RIGHT humerus. IMPRESSION: Suspicion of developing infiltrate at RIGHT base. Cannot exclude small LEFT pleural effusion. Electronically Signed   By: Ulyses Southward M.D.   On: 05/30/2020 08:21   DG CHEST PORT 1 VIEW  Result Date:  05/25/2020 CLINICAL DATA:  Heart failure EXAM: PORTABLE CHEST 1 VIEW COMPARISON:  05/23/2020 FINDINGS: Heart size remains enlarged. Study limited by degree of rotation to the RIGHT on today's exam. Signs of RIGHT pleural effusion and basilar airspace disease. Increased interstitial markings and hilar fullness. Blunting of LEFT costodiaphragmatic sulcus. Visualized skeletal structures with without acute process on limited assessment. IMPRESSION: Findings of CHF with edema and effusions, RIGHT greater than LEFT. Dense consolidation at the RIGHT lung base may represent worsening volume loss, difficult to exclude infection in this location. Electronically Signed   By: Donzetta Kohut M.D.   On: 05/25/2020 08:37   DG Chest Port 1 View  Result Date: 05/23/2020 CLINICAL DATA:  Hypoxia. EXAM: PORTABLE CHEST 1 VIEW COMPARISON:  CT 04/11/2020.  Chest x-ray 04/10/2020. FINDINGS: Mediastinum appears stable. Stable cardiomegaly and tortuosity of the thoracic aorta. Pulmonary venous congestion and mild increased bilateral interstitial prominence. Findings suggest CHF. Progression of right pleural effusion. Small new left pleural effusion. No pneumothorax. IMPRESSION: Cardiomegaly with pulmonary venous congestion mild increased bilateral interstitial prominence. Progressive right pleural effusion. Small new left pleural effusion. Findings suggest CHF. Electronically Signed   By: Maisie Fus  Register   On: 05/23/2020 14:37   ECHOCARDIOGRAM COMPLETE  Result Date: 05/28/2020    ECHOCARDIOGRAM REPORT   Patient Name:   FLYNN LININGER Date of Exam: 05/28/2020 Medical Rec #:  [id number]       Height:       66.0 in Accession #:    [id number]      Weight:       139.6 lb Date of Birth:  [birth year]       BSA:          1.716 m Patient Age:    86 years        BP:           128/61 mmHg Patient Gender: F               HR:           80 bpm. Exam Location:  Inpatient Procedure: 2D Echo, Color Doppler and Cardiac Doppler Indications:    I50.31  Acute diastolic (congestive) heart failure  History:        Patient has prior history of Echocardiogram examinations, most                 recent 03/02/2020.  Sonographer:    Roosvelt Maser RDCS Referring Phys: [phone] JARED M GARDNER IMPRESSIONS  1. Left ventricular ejection fraction, by estimation, is 60 to 65%. The left ventricle has normal function. The left ventricle has no regional wall motion abnormalities. Left ventricular diastolic parameters are indeterminate in the setting of atrial fibrillation.  2. Right ventricular systolic function is normal. The right ventricular size is normal. There is mildly elevated pulmonary artery systolic pressure. The estimated right ventricular systolic pressure is 36.9 mmHg.  3. Left atrial size was severely dilated.  4. Right atrial size was moderately dilated.  5. The mitral valve is grossly normal, mildly thickened and with mild annular calcification. Mild to moderate mitral valve regurgitation, two jets.  6. The aortic valve is tricuspid, mildly thickened and with mild to moderate annular calcification. Aortic  valve regurgitation is not visualized.  7. The inferior vena cava is normal in size with greater than 50% respiratory variability, suggesting right atrial pressure of 3 mmHg.  8. Evidence of atrial level shunting detected by color flow Doppler. There is a small patent foramen ovale with predominantly left to right shunting across the atrial septum. FINDINGS  Left Ventricle: Left ventricular ejection fraction, by estimation, is 60 to 65%. The left ventricle has normal function. The left ventricle has no regional wall motion abnormalities. The left ventricular internal cavity size was normal in size. There is  no left ventricular hypertrophy. Left ventricular diastolic parameters are indeterminate. Right Ventricle: The right ventricular size is normal. No increase in right ventricular wall thickness. Right ventricular systolic function is normal. There is mildly elevated  pulmonary artery systolic pressure. The tricuspid regurgitant velocity is 2.91  m/s, and with an assumed right atrial pressure of 3 mmHg, the estimated right ventricular systolic pressure is 36.9 mmHg. Left Atrium: Left atrial size was severely dilated. Right Atrium: Right atrial size was moderately dilated. Pericardium: Trivial pericardial effusion is present. The pericardial effusion is anterior to the right ventricle. Presence of pericardial fat pad. Mitral Valve: The mitral valve is grossly normal. There is mild thickening of the mitral valve leaflet(s). Mild mitral annular calcification. Mild to moderate mitral valve regurgitation. Tricuspid Valve: The tricuspid valve is grossly normal. Tricuspid valve regurgitation is mild. Aortic Valve: The aortic valve is tricuspid. . There is mild thickening of the aortic valve. Aortic valve regurgitation is not visualized. Mild to moderate aortic valve annular calcification. There is mild thickening of the aortic valve. Pulmonic Valve: The pulmonic valve was grossly normal. Pulmonic valve regurgitation is trivial. Aorta: The aortic root is normal in size and structure. Venous: The inferior vena cava is normal in size with greater than 50% respiratory variability, suggesting right atrial pressure of 3 mmHg. IAS/Shunts: Evidence of atrial level shunting detected by color flow Doppler. A small patent foramen ovale is detected with predominantly left to right shunting across the atrial septum.  LEFT VENTRICLE PLAX 2D LVIDd:         3.20 cm     Diastology LVIDs:         2.10 cm     LV e' lateral: 9.36 cm/s LV PW:         0.90 cm LV IVS:        0.90 cm LVOT diam:     1.70 cm LV SV:         35 LV SV Index:   20 LVOT Area:     2.27 cm  LV Volumes (MOD) LV vol d, MOD A2C: 62.5 ml LV vol d, MOD A4C: 55.4 ml LV vol s, MOD A2C: 29.2 ml LV vol s, MOD A4C: 20.0 ml LV SV MOD A2C:     33.3 ml LV SV MOD A4C:     55.4 ml LV SV MOD BP:      36.4 ml RIGHT VENTRICLE          IVC RV Basal  diam:  3.80 cm  IVC diam: 2.00 cm LEFT ATRIUM              Index       RIGHT ATRIUM           Index LA diam:        3.90 cm  2.27 cm/m  RA Area:     23.00 cm LA Vol (A2C):   123.0  ml 71.67 ml/m RA Volume:   73.80 ml  43.00 ml/m LA Vol (A4C):   96.3 ml  56.11 ml/m LA Biplane Vol: 110.0 ml 64.10 ml/m  AORTIC VALVE LVOT Vmax:   89.40 cm/s LVOT Vmean:  53.900 cm/s LVOT VTI:    0.154 m  AORTA Ao Root diam: 3.20 cm Ao Asc diam:  3.40 cm MR Peak grad:    86.5 mmHg   TRICUSPID VALVE MR Mean grad:    63.0 mmHg   TR Peak grad:   33.9 mmHg MR Vmax:         465.00 cm/s TR Vmax:        291.00 cm/s MR Vmean:        373.0 cm/s MR PISA:         0.57 cm    SHUNTS MR PISA Eff ROA: 4 mm       Systemic VTI:  0.15 m MR PISA Radius:  0.30 cm     Systemic Diam: 1.70 cm Nona Dell MD Electronically signed by Nona Dell MD Signature Date/Time: 05/28/2020/9:56:01 AM    Final    IR Radiologist Eval & Mgmt  Result Date: 05/16/2020 CLINICAL DATA:  86 year old female with perihepatic fluid collection, status post previous CT-guided drainage 04/12/2020. Prior surgery 04/04/2020 EXAM: CT ABDOMEN AND PELVIS WITH CONTRAST TECHNIQUE: Multidetector CT imaging of the abdomen and pelvis was performed using the standard protocol following bolus administration of intravenous contrast. CONTRAST:  ISOVUE-300 IOPAMIDOL (ISOVUE-300) INJECTION 61% COMPARISON:  04/26/2020, 04/12/2020 FINDINGS: Lower chest: Right-sided pleural effusion with atelectasis. Trace left-sided pleural fluid. Coronary artery calcifications. Hepatobiliary: Perihepatic fluid collection persists, with similar appearance of a lentiform fluid collection on the right capsule contributing to some mass effect on the adjacent liver parenchyma. There is perfusion anomaly with preferential enhancement in the liver parenchyma at this site. The pigtail drainage catheter no longer is within the fluid collection. Fluid collection measures 2.9 cm x 4.9 cm. The fluid is in  communication through a small tract to the gallbladder fossa. Fluid within the gallbladder floss of persists measuring 3.9 cm by 1.8 cm. Rim enhancement is present about the dumbbell shaped collection. Surgical changes of cholecystectomy. The pigtail drainage catheter is on the skin, likely under a dressing. Pancreas: Atrophic pancreas, similar appearance to prior. No local inflammation. Spleen: Unremarkable spleen Adrenals/Urinary Tract: - Right adrenal gland:  Unremarkable - Left adrenal gland: Unremarkable. - Right kidney: No hydronephrosis, nephrolithiasis, inflammation, or ureteral dilation. No focal lesion. - Left Kidney: No hydronephrosis, nephrolithiasis, inflammation, or ureteral dilation. Hypoenhancing/hypodense structure on the lateral cortex of left kidney is unchanged from the prior, likely benign cyst though incompletely characterized. - Urinary Bladder: Bladder partially distended. Diverticula at the right bladder base. Stomach/Bowel: - Stomach: Unremarkable. - Small bowel: Unremarkable - Appendix: Normal. - Colon: Unremarkable. Vascular/Lymphatic: Atherosclerotic calcifications of the abdominal aorta. Distal aorta measures 2.5 cm. Calcifications bilateral iliac arteries. Patent stent of the right common iliac artery. Proximal femoral arteries are patent with atherosclerosis. Unremarkable venous structures. Unremarkable portal structures. No periaortic fluid. Calcifications of the mesenteric arteries, which appear patent. Reproductive: Hysterectomy. Other: Mild edema within the mesenteric fat. No ascites. No additional fluid collection. Surgical changes along the midline abdomen. Musculoskeletal: No acute displaced fracture. Osteopenia. Multilevel degenerative changes of the visualized thoracolumbar spine. No bony canal narrowing. Mild degenerative changes of the bilateral hips. IMPRESSION: The previous pigtail drainage catheter within the perihepatic fluid collection has been withdrawn from the body  and terminates on the skin. Persisting  rim enhancing dumbbell shaped fluid collection along the right aspect of the liver margin, communicating with gallbladder fossa collection. Right greater than left pleural fluid. Aortic Atherosclerosis (ICD10-I70.0). Additional ancillary findings as above. Electronically Signed   By: Gilmer Mor D.O.   On: 05/16/2020 15:56   US THORACENTESIS ASP PLEURAL SPACE W/IMG GUIDE  Result Date: 05/28/2020 INDICATION: Patient with a history of congestive heart failure presents today with a right pleural effusion. Interventional radiology asked to perform a therapeutic and diagnostic thoracentesis. EXAM: ULTRASOUND GUIDED THORACENTESIS MEDICATIONS: 1% lidocaine 10 mL COMPLICATIONS: None immediate. PROCEDURE: An ultrasound guided thoracentesis was thoroughly discussed with the patient and questions answered. The benefits, risks, alternatives and complications were also discussed. The patient understands and wishes to proceed with the procedure. Written consent was obtained. Ultrasound was performed to localize and mark an adequate pocket of fluid in the right chest. The area was then prepped and draped in the normal sterile fashion. 1% Lidocaine was used for local anesthesia. Under ultrasound guidance a 6 Fr Safe-T-Centesis catheter was introduced. Thoracentesis was performed. The catheter was removed and a dressing applied. FINDINGS: A total of approximately 950 mL of clear yellow fluid was removed. Samples were sent to the laboratory as requested by the clinical team. IMPRESSION: Successful ultrasound guided right thoracentesis yielding 950 mL of pleural fluid. Read by: Alwyn Ren, NP Electronically Signed   By: Simonne Come M.D.   On: 05/28/2020 10:45     Subjective: No complaints feels great.  Discharge Exam: Vitals:   05/31/20 0821 05/31/20 0927  BP: 130/66   Pulse: 67   Resp: 17 19  Temp: 98.1 F (36.7 C)   SpO2: 94% 98%   Vitals:   05/30/20 1600 05/30/20  2241 05/31/20 0821 05/31/20 0927  BP: 124/72 (!) 115/56 130/66   Pulse:  73 67   Resp: Temp: 97.8 F (36.6 C) 97.9 F (36.6 C) 98.1 F (36.7 C)   TempSrc: Oral  Oral   SpO2: 98% 97% 94% 98%  Weight:      Height:        General: Pt is alert, awake, not in acute distress Cardiovascular: RRR, S1/S2 +, no rubs, no gallops Respiratory: CTA bilaterally, no wheezing, no rhonchi Abdominal: Soft, NT, ND, bowel sounds + Extremities: no edema, no cyanosis    The results of significant diagnostics from this hospitalization (including imaging, microbiology, ancillary and laboratory) are listed below for reference.     Microbiology: Recent Results (from the past 240 hour(s))  Culture, blood (routine x 2)     Status: None   Collection Time: 05/23/20  2:00 PM   Specimen: BLOOD RIGHT ARM  Result Value Ref Range Status   Specimen Description   Final    BLOOD RIGHT ARM Performed at [facility], [address], [place], [place] [address]    Special Requests   Final    BOTTLES DRAWN AEROBIC AND ANAEROBIC Blood Culture adequate volume Performed at [facility], [address] [place], [place] [address]    Culture   Final    NO GROWTH 5 DAYS Performed at [facility], [address], [place], [place] [address]    Report Status 05/28/2020 FINAL  Final  SARS Coronavirus 2 by RT PCR (hospital order, performed in [facility] hospital lab) Nasopharyngeal Nasopharyngeal Swab     Status: None   Collection Time: 05/23/20  2:24 PM   Specimen: Nasopharyngeal Swab  Result  Value Ref Range Status   SARS Coronavirus 2 NEGATIVE NEGATIVE Final    Comment: (NOTE) SARS-CoV-2 target nucleic acids are NOT DETECTED.  The SARS-CoV-2 RNA is generally detectable in upper and lower respiratory specimens during the acute phase of infection. The lowest concentration of SARS-CoV-2 viral copies this assay can detect is 250 copies / mL. A negative result does not  preclude SARS-CoV-2 infection and should not be used as the sole basis for treatment or other patient management decisions.  A negative result may occur with improper specimen collection / handling, submission of specimen other than nasopharyngeal swab, presence of viral mutation(s) within the areas targeted by this assay, and inadequate number of viral copies (<250 copies / mL). A negative result must be combined with clinical observations, patient history, and epidemiological information.  Fact Sheet for Patients:   BoilerBrush.com.cy  Fact Sheet for Healthcare Providers: https://pope.com/  This test is not yet approved or  cleared by the [place] FDA and has been authorized for detection and/or diagnosis of SARS-CoV-2 by FDA under an Emergency Use Authorization (EUA).  This EUA will remain in effect (meaning this test can be used) for the duration of the COVID-19 declaration under Section 564(b)(1) of the Act, 21 U.S.C. section 360bbb-3(b)(1), unless the authorization is terminated or revoked sooner.  Performed at [facility], [address] [place], [place] [address]   Urine culture     Status: Abnormal   Collection Time: 05/23/20  4:40 PM   Specimen: Urine, Clean Catch  Result Value Ref Range Status   Specimen Description   Final    URINE, CLEAN CATCH Performed at [facility], [address], [place], [place] [address]    Special Requests   Final    NONE Performed at [facility], [address] [place], [place] [address]    Culture MULTIPLE SPECIES PRESENT, SUGGEST RECOLLECTION (A)  Final   Report Status 05/24/2020 FINAL  Final  SARS Coronavirus 2 by RT PCR (hospital order, performed in [facility] hospital lab) Nasopharyngeal Nasopharyngeal Swab     Status: None   Collection Time: 05/27/20  5:11 PM   Specimen: Nasopharyngeal Swab  Result Value Ref Range  Status   SARS Coronavirus 2 NEGATIVE NEGATIVE Final    Comment: (NOTE) SARS-CoV-2 target nucleic acids are NOT DETECTED.  The SARS-CoV-2 RNA is generally detectable in upper and lower respiratory specimens during the acute phase of infection. The lowest concentration of SARS-CoV-2 viral copies this assay can detect is 250 copies / mL. A negative result does not preclude SARS-CoV-2 infection and should not be used as the sole basis for treatment or other patient management decisions.  A negative result may occur with improper specimen collection / handling, submission of specimen other than nasopharyngeal swab, presence of viral mutation(s) within the areas targeted by this assay, and inadequate number of viral copies (<250 copies / mL). A negative result must be combined with clinical observations, patient history, and epidemiological information.  Fact Sheet for Patients:   BoilerBrush.com.cy  Fact Sheet for Healthcare Providers: https://pope.com/  This test is not yet approved or  cleared by the [place] FDA and has been authorized for detection and/or diagnosis of SARS-CoV-2 by FDA under an Emergency Use Authorization (EUA).  This EUA will remain in effect (meaning this test can be used) for the duration of the COVID-19 declaration under Section 564(b)(1) of the Act, 21 U.S.C. section 360bbb-3(b)(1), unless the authorization is terminated or  revoked sooner.  Performed at [facility], [address] [place], [place] [address]   Gram stain     Status: None   Collection Time: 05/28/20  9:44 AM   Specimen: Pleura  Result Value Ref Range Status   Specimen Description PLEURAL FLUID  Final   Special Requests NONE  Final   Gram Stain   Final    WBC PRESENT,BOTH PMN AND MONONUCLEAR NO ORGANISMS SEEN CYTOSPIN SMEAR Performed at [facility], [address] [place], [place] [address]    Report Status 05/28/2020  FINAL  Final     Labs: BNP (last 3 results) Recent Labs    05/23/20 1400 05/27/20 1443  BNP 266.4* 217.8*   Basic Metabolic Panel: Recent Labs  Lab 05/27/20 1443 05/28/20 0236 05/29/20 0405 05/30/20 0235 05/31/20 0316  NA 141 142 143 135 136  K 3.7 4.0 4.2 4.1 4.3  CL 95* 97* 98 93* 95*  CO2 35* 37* 36* 33* 31  GLUCOSE 131* 126* 107* 99 100*  BUN 20 21 29* 32* 35*  CREATININE 0.62 0.69 0.79 0.80 0.90  CALCIUM 8.8* 8.5* 8.5* 8.4* 8.5*  MG  --   --  1.7 1.7 1.7  PHOS  --   --  3.3 2.5 2.5   Liver Function Tests: Recent Labs  Lab 05/27/20 1443 05/28/20 1416 05/29/20 0405 05/30/20 0235 05/31/20 0316  AST 18  --   --   --   --   ALT 19  --   --   --   --   ALKPHOS 89  --   --   --   --   BILITOT 1.2  --   --   --   --   PROT 6.7 6.4*  --   --   --   ALBUMIN 3.0*  --  2.4* 2.4* 2.5*   Recent Labs  Lab 05/27/20 1443  LIPASE 23   No results for input(s): AMMONIA in the last 168 hours. CBC: Recent Labs  Lab 05/27/20 1443 05/27/20 1443 05/29/20 0405 05/29/20 1049 05/30/20 0235 05/30/20 2146 05/31/20 0316  WBC 9.3  --  8.6 8.1 8.1  --  8.3  HGB 8.9*   < > 7.7* 7.9* 7.4* 9.7* 9.2*  HCT 29.9*   < > 25.8* 26.8* 24.4* 31.1* 29.4*  MCV 84.7  --  85.4 85.9 83.0  --  82.8  PLT 264  --  241 246 237  --  233   < > = values in this interval not displayed.   Cardiac Enzymes: No results for input(s): CKTOTAL, CKMB, CKMBINDEX, TROPONINI in the last 168 hours. BNP: Invalid input(s): POCBNP CBG: No results for input(s): GLUCAP in the last 168 hours. D-Dimer No results for input(s): DDIMER in the last 72 hours. Hgb A1c Recent Labs    05/29/20 0405  HGBA1C 6.1*   Lipid Profile No results for input(s): CHOL, HDL, LDLCALC, TRIG, CHOLHDL, LDLDIRECT in the last 72 hours. Thyroid function studies No results for input(s): TSH, T4TOTAL, T3FREE, THYROIDAB in the last 72 hours.  Invalid input(s): FREET3 Anemia work up Recent Labs    05/29/20 0405  VITAMINB12 183   FOLATE 17.2  FERRITIN 42  TIBC 325  IRON 20*  RETICCTPCT 2.5   Urinalysis    Component Value Date/Time   COLORURINE YELLOW 05/23/2020 1640   APPEARANCEUR CLEAR 05/23/2020 1640   LABSPEC 1.014 05/23/2020 1640   PHURINE 6.0 05/23/2020 1640   GLUCOSEU NEGATIVE 05/23/2020 1640  HGBUR NEGATIVE 05/23/2020 1640   BILIRUBINUR NEGATIVE 05/23/2020 1640   BILIRUBINUR Negative 02/15/2020 1052   KETONESUR NEGATIVE 05/23/2020 1640   PROTEINUR >=300 (A) 05/23/2020 1640   UROBILINOGEN 0.2 02/15/2020 1052   UROBILINOGEN 0.2 08/20/2019 1732   NITRITE NEGATIVE 05/23/2020 1640   LEUKOCYTESUR NEGATIVE 05/23/2020 1640   Sepsis Labs Invalid input(s): PROCALCITONIN,  WBC,  LACTICIDVEN Microbiology Recent Results (from the past 240 hour(s))  Culture, blood (routine x 2)     Status: None   Collection Time: 05/23/20  2:00 PM   Specimen: BLOOD RIGHT ARM  Result Value Ref Range Status   Specimen Description   Final    BLOOD RIGHT ARM Performed at [facility], [address] [place], [place] [address]    Special Requests   Final    BOTTLES DRAWN AEROBIC AND ANAEROBIC Blood Culture adequate volume Performed at [facility], [address] [place], [place] [address]    Culture   Final    NO GROWTH 5 DAYS Performed at [facility], [address] [place], [place] [address]    Report Status 05/28/2020 FINAL  Final  SARS Coronavirus 2 by RT PCR (hospital order, performed in [facility] hospital lab) Nasopharyngeal Nasopharyngeal Swab     Status: None   Collection Time: 05/23/20  2:24 PM   Specimen: Nasopharyngeal Swab  Result Value Ref Range Status   SARS Coronavirus 2 NEGATIVE NEGATIVE Final    Comment: (NOTE) SARS-CoV-2 target nucleic acids are NOT DETECTED.  The SARS-CoV-2 RNA is generally detectable in upper and lower respiratory specimens during the acute phase of infection. The lowest concentration of SARS-CoV-2 viral copies this assay can detect is  250 copies / mL. A negative result does not preclude SARS-CoV-2 infection and should not be used as the sole basis for treatment or other patient management decisions.  A negative result may occur with improper specimen collection / handling, submission of specimen other than nasopharyngeal swab, presence of viral mutation(s) within the areas targeted by this assay, and inadequate number of viral copies (<250 copies / mL). A negative result must be combined with clinical observations, patient history, and epidemiological information.  Fact Sheet for Patients:   BoilerBrush.com.cy  Fact Sheet for Healthcare Providers: https://pope.com/  This test is not yet approved or  cleared by the [place] FDA and has been authorized for detection and/or diagnosis of SARS-CoV-2 by FDA under an Emergency Use Authorization (EUA).  This EUA will remain in effect (meaning this test can be used) for the duration of the COVID-19 declaration under Section 564(b)(1) of the Act, 21 U.S.C. section 360bbb-3(b)(1), unless the authorization is terminated or revoked sooner.  Performed at [facility], [address], [place], [place] [address]   Urine culture     Status: Abnormal   Collection Time: 05/23/20  4:40 PM   Specimen: Urine, Clean Catch  Result Value Ref Range Status   Specimen Description   Final    URINE, CLEAN CATCH Performed at [facility], [address] [place], [place] [address]    Special Requests   Final    NONE Performed at [facility], [address] [place], [place] [address]    Culture MULTIPLE SPECIES PRESENT, SUGGEST RECOLLECTION (A)  Final   Report Status 05/24/2020 FINAL  Final  SARS Coronavirus 2 by RT PCR (hospital order, performed in [facility] hospital lab) Nasopharyngeal Nasopharyngeal Swab     Status:  None   Collection Time: 05/27/20  5:11 PM   Specimen:  Nasopharyngeal Swab  Result Value Ref Range Status   SARS Coronavirus 2 NEGATIVE NEGATIVE Final    Comment: (NOTE) SARS-CoV-2 target nucleic acids are NOT DETECTED.  The SARS-CoV-2 RNA is generally detectable in upper and lower respiratory specimens during the acute phase of infection. The lowest concentration of SARS-CoV-2 viral copies this assay can detect is 250 copies / mL. A negative result does not preclude SARS-CoV-2 infection and should not be used as the sole basis for treatment or other patient management decisions.  A negative result may occur with improper specimen collection / handling, submission of specimen other than nasopharyngeal swab, presence of viral mutation(s) within the areas targeted by this assay, and inadequate number of viral copies (<250 copies / mL). A negative result must be combined with clinical observations, patient history, and epidemiological information.  Fact Sheet for Patients:   BoilerBrush.com.cy  Fact Sheet for Healthcare Providers: https://pope.com/  This test is not yet approved or  cleared by the [place] FDA and has been authorized for detection and/or diagnosis of SARS-CoV-2 by FDA under an Emergency Use Authorization (EUA).  This EUA will remain in effect (meaning this test can be used) for the duration of the COVID-19 declaration under Section 564(b)(1) of the Act, 21 U.S.C. section 360bbb-3(b)(1), unless the authorization is terminated or revoked sooner.  Performed at [facility], [address] [place], [place] [address]   Gram stain     Status: None   Collection Time: 05/28/20  9:44 AM   Specimen: Pleura  Result Value Ref Range Status   Specimen Description PLEURAL FLUID  Final   Special Requests NONE  Final   Gram Stain   Final    WBC PRESENT,BOTH PMN AND MONONUCLEAR NO ORGANISMS SEEN CYTOSPIN SMEAR Performed at [facility], [address]  [place], [place] [address]    Report Status 05/28/2020 FINAL  Final     Time coordinating discharge: Over 30 minutes  SIGNED:   Marinda Elk, MD  [facility] 05/31/2020, 10:30 AM Pager   If 7PM-7AM, please contact night-coverage [email or web address] Password [email or web address]

## 2020-05-31 NOTE — Consult Note (Signed)
   [facility] Inpatient Consult   05/31/2020  Cheryl Atkinson 07-Jul-1931 [id number]   [facility] [[facility]]  Accountable Care Organization [ACO] Patient:   Patient screened for extreme high risk score for unplanned readmission score and for less than 7 days admission re-hospitalizations.  Medical record reviewed to check if potential [facility]  [[facility]] Care Management program/service needed.  Review of last [facility] RN PAC note indicate patient had a referral to [facility] and also potentially for home palliative follow up from her transition from SNF. Review of patient's medical record reveals patient was recommended for SNF however, family wants home with home health and hospital was noted for DME needs.  Primary Care Provider is  Tana Conch and this provider is listed to provide the transition of care [TOC] for post hospital follow up.  Plan:  Follow up with [facility] and they confirmed they will continue with the patient post hospital. Messaged and updated inpatient [facility] RNCM as well of post hospital follow up.    For questions contact:   Charlesetta Shanks, RN BSN CCM [facility]  [phone] business mobile phone Toll free office [phone]  Fax number: [phone] [email or web address]

## 2020-05-31 NOTE — Plan of Care (Signed)
Patient is adequate for discharge.  

## 2020-05-31 NOTE — Progress Notes (Signed)
One unit of blood finished w/o complications. Will monitor.   Labs put in for H&H.

## 2020-05-31 NOTE — Progress Notes (Signed)
Modified Barium Swallow Progress Note  Patient Details  Name: Cheryl Atkinson MRN: [id number] Date of Birth: 1931-07-14  Today's Date: 05/31/2020  Modified Barium Swallow completed.  Full report located under Chart Review in the Imaging Section.  Brief recommendations include the following:  Clinical Impression  Pt demonstrated mild oropharyngeal dysphagia with penetration of thin barium. Decreased oral cohesion and partial sublingual spill with thin. Thin barium was penetrated onto vocal cords with straw from pyriform sinus residue and she immediately sensed to clear throat clearing vestibule. Suspect decreased esophageal dysfunction as scan revealed pill stopping at GE juncture only passing with puree and stasis mid to distal esophagus. She has COPD however coordinated respirations and swallows without difficulty. Recommend pt continue regular texture, thin liquids, straws allowed, alternate liquids and solids after every 3 bites, sit upright after meals and pills whole in puree. Tenna Child Evaluation Recommendations       SLP Diet Recommendations: Regular solids;Thin liquid   Liquid Administration via: Cup;Straw   Medication Administration: Whole meds with puree   Supervision: Patient able to self feed   Compensations: Slow rate;Small sips/bites;Follow solids with liquid   Postural Changes: Seated upright at 90 degrees   Oral Care Recommendations: Oral care BID        [name] 05/31/2020,1:34 PM  [name] [place].Ed Nurse, children's [phone] Office [phone]

## 2020-05-31 NOTE — Care Management (Addendum)
10:00 Spoke w son Tammy Sours, he has not received the hospital bed at home.  Notified [employer] to contact Greg to set up delivery and provided his cell phone number.  12:00 Per Tammy Sours, hospital bed to be delivered within the next hour. He requested pick up via PTAR at 4:30. Address confirmed and PTAR requetsed. Gold DNR and PTAR forms are on chart. [facility] aware of DC

## 2020-05-31 NOTE — Evaluation (Signed)
Clinical/Bedside Swallow Evaluation Patient Details  Name: Cheryl Atkinson MRN: [id number] Date of Birth: [birth year]  Today's Date: 05/31/2020 Time: SLP Start Time (ACUTE ONLY): 0915 SLP Stop Time (ACUTE ONLY): 0925 SLP Time Calculation (min) (ACUTE ONLY): 10 min  Past Medical History:  Past Medical History:  Diagnosis Date  . Acute cholecystitis 12/29/2019  . Anemia   . Arthritis    "shoulders" (05/04/2015)  . Cellulitis of right lower extremity 11/27/2013  . CHF (congestive heart failure) (HCC)   . Chronic lower back pain   . Constipation   . COPD (chronic obstructive pulmonary disease) (HCC)   . GERD (gastroesophageal reflux disease)   . History of hiatal hernia   . HLD (hyperlipidemia)   . Hypertension   . Insomnia   . Osteoporosis   . Peripheral arterial disease (HCC)   . Restless leg syndrome   . Thyroid disease    Past Surgical History:  Past Surgical History:  Procedure Laterality Date  . ABDOMINAL AORTAGRAM  05/04/2015   Procedure: Abdominal Aortagram;  Surgeon: Runell Gess, MD;  Location: [facility] INVASIVE CV LAB;  Service: Cardiovascular;;  . APPENDECTOMY    . CATARACT EXTRACTION W/ INTRAOCULAR LENS  IMPLANT, BILATERAL Bilateral   . CHOLECYSTECTOMY N/A 04/04/2020   Procedure: LAPAROSCOPIC CHOLECYSTECTOMY;  Surgeon: Violeta Gelinas, MD;  Location[facility] OR;  Service: General;  Laterality: N/A;  . I & D EXTREMITY Right 12/22/2016   Procedure: IRRIGATION AND DEBRIDEMENT EXTREMITY;  Surgeon: Mack Hook, MD;  Location: [facility] OR;  Service: Orthopedics;  Laterality: Right;  . IR EXCHANGE BILIARY DRAIN  03/13/2020  . IR PATIENT EVAL TECH 0-60 MINS  12/30/2019  . IR PERC CHOLECYSTOSTOMY  01/27/2020  . IR RADIOLOGIST EVAL & MGMT  05/16/2020  . PERIPHERAL VASCULAR CATHETERIZATION N/A 05/04/2015   Procedure: Lower Extremity Angiography;  Surgeon: Runell Gess, MD;  Location: [facility] INVASIVE CV LAB;  Service: Cardiovascular;  Laterality: N/A;  . PERIPHERAL VASCULAR CATHETERIZATION   05/04/2015   Procedure: Peripheral Vascular Intervention;  Surgeon: Runell Gess, MD;  Location: [facility] INVASIVE CV LAB;  Service: Cardiovascular;;  RCIA - 7x22 ICAST  . PERIPHERAL VASCULAR CATHETERIZATION Right 09/04/2015   Procedure: Peripheral Vascular Atherectomy;  Surgeon: Runell Gess, MD;  Location: [facility] INVASIVE CV LAB;  Service: Cardiovascular;  Laterality: Right;  SFA  . sfa Right 09/04/2015   de balloon  . THYROID SURGERY Right ?2013   "had goiter taken off my neck"  . TONSILLECTOMY    . VAGINAL HYSTERECTOMY     HPI:  Patient is an 86 year old female with a history of atrial fibrillation, hypertension, COPD, GERRD,CHF, prediabetes,   Lap chole 04/05/2020 readmission 5/6 through 04/17/2020 lethargy confusion 2/2 opiate narcosis. DC to SNF, then home 05/11/20. Comes to ED 05/23/20 with HTN , SOB, acute exacerbation of CHF. CXR Suspicion of developing infiltrate at RIGHT base. Cannot exclude small LEFT pleural effusion.   Assessment / Plan / Recommendation Clinical Impression  Pt has remote history of dysphagia, diagnosis COPD and GERD. She reports frequent cough and globus sensation. CXR Suspicion of developing infiltrate at RIGHT base. Immediate throat clears may result from laryngeal involvement however suspect it may be only esophageal or habitual throat clear. Am recommending MBS to determine function and if strategies are needed in pt who appears aware and able to follow swallow strategies if recommended.    SLP Visit Diagnosis: Dysphagia, unspecified (R13.10)    Aspiration Risk  Mild aspiration risk    Diet Recommendation Regular;Thin liquid  Liquid Administration via: Cup;Straw Medication Administration: Whole meds with liquid Supervision: Patient able to self feed Compensations: Slow rate;Small sips/bites Postural Changes: Seated upright at 90 degrees    Other  Recommendations Oral Care Recommendations: Oral care BID   Follow up Recommendations        Frequency and  Duration            Prognosis        Swallow Study   General HPI: Patient is an 86 year old female with a history of atrial fibrillation, hypertension, COPD, GERRD,CHF, prediabetes,   Lap chole 04/05/2020 readmission 5/6 through 04/17/2020 lethargy confusion 2/2 opiate narcosis. DC to SNF, then home 05/11/20. Comes to ED 05/23/20 with HTN , SOB, acute exacerbation of CHF. CXR Suspicion of developing infiltrate at RIGHT base. Cannot exclude small LEFT pleural effusion. Type of Study: Bedside Swallow Evaluation Previous Swallow Assessment:  (see HPI) Diet Prior to this Study: Regular;Thin liquids Temperature Spikes Noted: No Respiratory Status: Room air History of Recent Intubation: No Behavior/Cognition: Alert;Cooperative;Pleasant mood Oral Cavity Assessment: Within Functional Limits Oral Care Completed by SLP: No Oral Cavity - Dentition: Dentures, top;Dentures, bottom Vision: Functional for self-feeding Self-Feeding Abilities: Able to feed self Patient Positioning: Upright in chair Baseline Vocal Quality: Normal Volitional Cough: Strong Volitional Swallow: Able to elicit    Oral/Motor/Sensory Function Overall Oral Motor/Sensory Function: Within functional limits   Ice Chips Ice chips: Not tested   Thin Liquid Thin Liquid: Impaired Presentation: Straw Oral Phase Impairments:  (none) Oral Phase Functional Implications:  (none) Pharyngeal  Phase Impairments: Throat Clearing - Immediate    Nectar Thick Nectar Thick Liquid: Not tested   Honey Thick Honey Thick Liquid: Not tested   Puree Puree: Not tested   Solid     Solid: Within functional limits      Cheryl Atkinson 05/31/2020,9:39 AM   Cheryl Atkinson [place].Ed Nurse, children's [phone] Office [phone]

## 2020-05-31 NOTE — Progress Notes (Signed)
Occupational Therapy Treatment Patient Details Name: Cheryl Atkinson MRN: [id number] DOB: 1931-05-07 Today's Date: 05/31/2020    History of present illness Patient is an 86 year old female with a history of atrial fibrillation, hypertension, COPD, CHF, prediabetes ,   Lap chole 04/05/2020 readmission 5/6 through 04/17/2020 lethargy confusion 2/2 opiate narcosis. DC to SNF, then home 05/11/20. Comes to ED 05/23/20 with HTN , SOB, acute exacerbation of CHF   OT comments  Pt progressing with OT goals, but continues to require extensive physical assist for transfers. Pt overall Setup for UB ADLs, increasing confidence with maintain sitting balance EOB during tasks. Pt overall Mod A for sit to stand in [place] with 1 person assist, Total A for cleanup after urine incontinence. Pt motivated to sit in recliner chair for breakfast. Continue to recommend SNF for short term rehab, but if pt continues to refuse SNF, recommend 24/7 physical assist at home and HHOT.    Follow Up Recommendations  SNF;Supervision/Assistance - 24 hour (If pt continues to decline SNF, recommend HHOT & 24/7 assist)    Equipment Recommendations  Hospital bed    Recommendations for Other Services      Precautions / Restrictions Precautions Precautions: Fall Restrictions Weight Bearing Restrictions: No       Mobility Bed Mobility Overal bed mobility: Needs Assistance Bed Mobility: Supine to Sit     Supine to sit: Max assist;HOB elevated     General bed mobility comments: Max A to sit EOB. pt with improved initiation of advancing B LE, but ultimately required Max A to scoot forward and lift trunk  Transfers Overall transfer level: Needs assistance   Transfers: Sit to/from Stand Sit to Stand: Mod assist;From elevated surface         General transfer comment: Mod A to stand in [place], physical assist needed to tuck in bottom for optimal standing posture and safety. Pt transferred to recliner chair with Stedy     Balance Overall balance assessment: Needs assistance;History of Falls Sitting-balance support: Bilateral upper extremity supported;Feet supported Sitting balance-Leahy Scale: Poor Sitting balance - Comments: Pt reliant on holding to recliner chair armrest to maintain sitting balance   Standing balance support: Bilateral upper extremity supported Standing balance-Leahy Scale: Poor Standing balance comment: use of Stedy and physical support to maintain standing in stedy                           ADL either performed or assessed with clinical judgement   ADL Overall ADL's : Needs assistance/impaired Eating/Feeding: Set up;Sitting Eating/Feeding Details (indicate cue type and reason): Setup to open containers  Grooming: Wash/dry hands;Wash/dry face;Set up;Sitting Grooming Details (indicate cue type and reason): Setup to wash face and hands seated in recliner         Upper Body Dressing : Set up;Sitting Upper Body Dressing Details (indicate cue type and reason): Setup to don hospital gown seated in recliner Lower Body Dressing: Maximal assistance;Sit to/from stand Lower Body Dressing Details (indicate cue type and reason): Max A to adjust socks sitting EOB     Toileting- Clothing Manipulation and Hygiene: Total assistance;Sit to/from stand Toileting - Clothing Manipulation Details (indicate cue type and reason): Total A for peri care in standing with Stedy after urine incontinence. Pt reports wearing depends at home       General ADL Comments: Pt with improving confidence to attempt tasks, but still significantly limited by LB strength, standing/sitting balance and endurance     Vision  Vision Assessment?: No apparent visual deficits   Perception     Praxis      Cognition Arousal/Alertness: Awake/alert Behavior During Therapy: WFL for tasks assessed/performed Overall Cognitive Status: No family/caregiver present to determine baseline cognitive functioning                                  General Comments: lacks insight into physical deficits        Exercises     Shoulder Instructions       General Comments VSS on RA    Pertinent Vitals/ Pain       Pain Assessment: No/denies pain  Home Living                                          Prior Functioning/Environment              Frequency  Min 2X/week        Progress Toward Goals  OT Goals(current goals can now be found in the care plan section)  Progress towards OT goals: Progressing toward goals  Acute Rehab OT Goals Patient Stated Goal: to go home OT Goal Formulation: With patient Time For Goal Achievement: 06/12/20 Potential to Achieve Goals: Fair ADL Goals Pt Will Transfer to Toilet: with mod assist;stand pivot transfer;bedside commode Pt Will Perform Toileting - Clothing Manipulation and hygiene: with mod assist;sit to/from stand Pt/caregiver will Perform Home Exercise Program: Increased strength;Both right and left upper extremity;With theraband;With Supervision;With written HEP provided Additional ADL Goal #1: Pt to demonstrate ability to complete at least 3 sit to stand transfers using LRAD with no more than Min A in preparation for ADL transfers Additional ADL Goal #2: Pt to demonstrate ability to maintain sitting balance during ADLs for 3-5 minutes with no more than min guard.  Plan Discharge plan remains appropriate    Co-evaluation                 AM-PAC OT "6 Clicks" Daily Activity     Outcome Measure   Help from another person eating meals?: A Little Help from another person taking care of personal grooming?: A Little Help from another person toileting, which includes using toliet, bedpan, or urinal?: Total Help from another person bathing (including washing, rinsing, drying)?: A Lot Help from another person to put on and taking off regular upper body clothing?: A Little Help from another person to put on and  taking off regular lower body clothing?: A Lot 6 Click Score: 14    End of Session    OT Visit Diagnosis: Unsteadiness on feet (R26.81);Other abnormalities of gait and mobility (R26.89);History of falling (Z91.81);Muscle weakness (generalized) (M62.81)   Activity Tolerance Patient tolerated treatment well   Patient Left in chair;with call bell/phone within reach   Nurse Communication Mobility status (use of Stedy)        Time: 0160-1093 OT Time Calculation (min): 24 min  Charges: OT General Charges $OT Visit: 1 Visit OT Treatments $Self Care/Home Management : 8-22 mins $Therapeutic Activity: 8-22 mins  Lorre Munroe, OTR/L   Lorre Munroe 05/31/2020, 9:14 AM

## 2020-06-01 ENCOUNTER — Telehealth: Payer: Self-pay

## 2020-06-01 DIAGNOSIS — I5032 Chronic diastolic (congestive) heart failure: Secondary | ICD-10-CM | POA: Diagnosis not present

## 2020-06-01 DIAGNOSIS — K9189 Other postprocedural complications and disorders of digestive system: Secondary | ICD-10-CM | POA: Diagnosis not present

## 2020-06-01 DIAGNOSIS — F0390 Unspecified dementia without behavioral disturbance: Secondary | ICD-10-CM | POA: Diagnosis not present

## 2020-06-01 DIAGNOSIS — I110 Hypertensive heart disease with heart failure: Secondary | ICD-10-CM | POA: Diagnosis not present

## 2020-06-01 DIAGNOSIS — Z4803 Encounter for change or removal of drains: Secondary | ICD-10-CM | POA: Diagnosis not present

## 2020-06-01 DIAGNOSIS — K651 Peritoneal abscess: Secondary | ICD-10-CM | POA: Diagnosis not present

## 2020-06-01 NOTE — Telephone Encounter (Signed)
Thanks team. Can you tell me if she is currently taking either of them- I know PT is saying that was the recommendation but im not 100% sure from discharge summary.   I am going to ask the doctor that discharged her (Dr. David Stall). Plavix and eliquis were on discharge list. On comments for follow up though "Please obtain BMP/CBC in one week, discussed with the patient as she is at high risk of bleeding while she is on Plavix and Eliquis and discussed the need to discontinue Plavix as an outpatient."  then "At high bleeding risk as she is on Plavix and Eliquis.  Will need to be evaluated by her PCP to see if Eliquis can be stopped as an outpatient."  To me it looks like plan was to stop plavix only for now. I think that's reasonable and she has cardiology follow up on the 14th so can get their opinion on perhaps doing elqiuis alone.   Also Dr. David Stall- was it thought to be GI source of bleeding and would you recommend GI consult?  Has she been set up for TCM Visit so we can recheck CBC?

## 2020-06-01 NOTE — Telephone Encounter (Signed)
Cheryl Atkinson from PT called and states that hopsital recommended pt to stop Plavix and Eliquis due to bleeding and she wanted to make you aware.

## 2020-06-02 ENCOUNTER — Telehealth: Payer: Self-pay

## 2020-06-02 DIAGNOSIS — F0390 Unspecified dementia without behavioral disturbance: Secondary | ICD-10-CM | POA: Diagnosis not present

## 2020-06-02 DIAGNOSIS — I5032 Chronic diastolic (congestive) heart failure: Secondary | ICD-10-CM | POA: Diagnosis not present

## 2020-06-02 DIAGNOSIS — I110 Hypertensive heart disease with heart failure: Secondary | ICD-10-CM | POA: Diagnosis not present

## 2020-06-02 DIAGNOSIS — Z4803 Encounter for change or removal of drains: Secondary | ICD-10-CM | POA: Diagnosis not present

## 2020-06-02 DIAGNOSIS — K651 Peritoneal abscess: Secondary | ICD-10-CM | POA: Diagnosis not present

## 2020-06-02 DIAGNOSIS — K9189 Other postprocedural complications and disorders of digestive system: Secondary | ICD-10-CM | POA: Diagnosis not present

## 2020-06-02 NOTE — Telephone Encounter (Signed)
Called and spoke with Will and provided verbal order.

## 2020-06-02 NOTE — Telephone Encounter (Signed)
I had reached out to hospitalist and still awaiting hearing back. They mentioned possibly stopping eliquis or plavix due to significant anemia requiring transfusion. She was discharged on both meds it appears. She is on the eliquis for a fib and plavix for PAD. I am going to ask Dr. Allyson Sabal if hes ok with [place] holding the plavix for now. Team- lets have her hold the plavix and continue eliquis over the weekend. Im open to changin this plan based on Dr. Hazle Coca input.   If he prefers not to hold that then we could also consider holding eliquis (which would increase stroke risk some)

## 2020-06-02 NOTE — Telephone Encounter (Signed)
Fine with me to DC plavix  JJB

## 2020-06-02 NOTE — Telephone Encounter (Signed)
Has this pt been set up for TCM?

## 2020-06-02 NOTE — Telephone Encounter (Cosign Needed)
Transition Care Management Follow-up Telephone Call  Date of discharge and from where: 06/01/20  How have you been since you were released from the hospital? Pt states she is feeling ok and had therapy at the house today along with in home nurse visit. She is still having a hard time getting out of bed. She has care support of her cousin Cheryl Atkinson, whom stays with her. Reviewed medications with pt and care support, whom states no medication were to be held or discontinued at this time per their discharge instructions. Pt states she is taking Eliquis and Plavix and has taken dose today.  Any questions or concerns? No   Items Reviewed:  Did the pt receive and understand the discharge instructions provided? Yes   Medications obtained and verified? Yes   Any new allergies since your discharge? No   Dietary orders reviewed? No  Do you have support at home? Yes   Functional Questionnaire: (I = Independent and D = Dependent) ADLs: D  Bathing/Dressing- D  Meal Prep- D  Eating- I  Maintaining continence- I  Transferring/Ambulation- D  Managing Meds- D  Follow up appointments reviewed:   PCP Hospital f/u appt confirmed? Yes  Scheduled to see Dr Cheryl Atkinson within 2 weeks per front desk  Specialist Hospital f/u appt confirmed? Yes  Scheduled to see  [facility] Department: [facility]  Referring Provider: Runell Atkinson   on 06/14/20 @ 9 a.m.  Are transportation arrangements needed? No   If their condition worsens, is the pt aware to call PCP or go to the Emergency Dept.? No  Was the patient provided with contact information for the PCP's office or ED? Yes  Was to pt encouraged to call back with questions or concerns? Yes

## 2020-06-02 NOTE — Telephone Encounter (Signed)
Please advise 

## 2020-06-02 NOTE — Telephone Encounter (Signed)
OT, Will Schalla from [facility] called requesting OT for Pt. 1x a week for first week 2x a week for following 3 weeks 1x a week for last week

## 2020-06-03 LAB — CULTURE, BODY FLUID W GRAM STAIN -BOTTLE: Culture: NO GROWTH

## 2020-06-03 NOTE — Telephone Encounter (Signed)
Perfect. Thanks Dr. Allyson Sabal. We will see patient on Tuesday.   I called her this morning and told her to hold plavix and continue eliquis

## 2020-06-04 DIAGNOSIS — K9189 Other postprocedural complications and disorders of digestive system: Secondary | ICD-10-CM | POA: Diagnosis not present

## 2020-06-04 DIAGNOSIS — Z4803 Encounter for change or removal of drains: Secondary | ICD-10-CM | POA: Diagnosis not present

## 2020-06-04 DIAGNOSIS — K651 Peritoneal abscess: Secondary | ICD-10-CM | POA: Diagnosis not present

## 2020-06-04 DIAGNOSIS — I5032 Chronic diastolic (congestive) heart failure: Secondary | ICD-10-CM | POA: Diagnosis not present

## 2020-06-04 DIAGNOSIS — I110 Hypertensive heart disease with heart failure: Secondary | ICD-10-CM | POA: Diagnosis not present

## 2020-06-04 DIAGNOSIS — F0390 Unspecified dementia without behavioral disturbance: Secondary | ICD-10-CM | POA: Diagnosis not present

## 2020-06-06 ENCOUNTER — Other Ambulatory Visit: Payer: Medicare Other

## 2020-06-06 ENCOUNTER — Encounter: Payer: Self-pay | Admitting: Family Medicine

## 2020-06-06 ENCOUNTER — Ambulatory Visit (INDEPENDENT_AMBULATORY_CARE_PROVIDER_SITE_OTHER): Payer: Medicare Other | Admitting: Family Medicine

## 2020-06-06 ENCOUNTER — Other Ambulatory Visit: Payer: Self-pay

## 2020-06-06 VITALS — BP 122/70 | HR 80 | Temp 98.4°F | Ht 66.0 in | Wt 124.2 lb

## 2020-06-06 DIAGNOSIS — I480 Paroxysmal atrial fibrillation: Secondary | ICD-10-CM

## 2020-06-06 DIAGNOSIS — D509 Iron deficiency anemia, unspecified: Secondary | ICD-10-CM

## 2020-06-06 DIAGNOSIS — I10 Essential (primary) hypertension: Secondary | ICD-10-CM | POA: Diagnosis not present

## 2020-06-06 DIAGNOSIS — I5032 Chronic diastolic (congestive) heart failure: Secondary | ICD-10-CM

## 2020-06-06 DIAGNOSIS — Z79899 Other long term (current) drug therapy: Secondary | ICD-10-CM | POA: Diagnosis not present

## 2020-06-06 DIAGNOSIS — J90 Pleural effusion, not elsewhere classified: Secondary | ICD-10-CM | POA: Diagnosis not present

## 2020-06-06 DIAGNOSIS — R739 Hyperglycemia, unspecified: Secondary | ICD-10-CM | POA: Diagnosis not present

## 2020-06-06 DIAGNOSIS — E782 Mixed hyperlipidemia: Secondary | ICD-10-CM

## 2020-06-06 DIAGNOSIS — I70219 Atherosclerosis of native arteries of extremities with intermittent claudication, unspecified extremity: Secondary | ICD-10-CM

## 2020-06-06 DIAGNOSIS — Z515 Encounter for palliative care: Secondary | ICD-10-CM

## 2020-06-06 DIAGNOSIS — J449 Chronic obstructive pulmonary disease, unspecified: Secondary | ICD-10-CM | POA: Diagnosis not present

## 2020-06-06 LAB — COMPREHENSIVE METABOLIC PANEL
ALT: 19 U/L (ref 0–35)
AST: 18 U/L (ref 0–37)
Albumin: 3.6 g/dL (ref 3.5–5.2)
Alkaline Phosphatase: 94 U/L (ref 39–117)
BUN: 26 mg/dL — ABNORMAL HIGH (ref 6–23)
CO2: 27 mEq/L (ref 19–32)
Calcium: 9 mg/dL (ref 8.4–10.5)
Chloride: 101 mEq/L (ref 96–112)
Creatinine, Ser: 0.74 mg/dL (ref 0.40–1.20)
GFR: 74.28 mL/min (ref >60.00)
Glucose, Bld: 109 mg/dL — ABNORMAL HIGH (ref 70–99)
Potassium: 4.4 mEq/L (ref 3.5–5.1)
Sodium: 137 mEq/L (ref 135–145)
Total Bilirubin: 0.6 mg/dL (ref 0.2–1.2)
Total Protein: 6.7 g/dL (ref 6.0–8.3)

## 2020-06-06 LAB — CBC WITH DIFFERENTIAL/PLATELET
Basophils Absolute: 0.1 10*3/uL (ref 0.0–0.1)
Basophils Relative: 1 % (ref 0.0–3.0)
Eosinophils Absolute: 0.1 10*3/uL (ref 0.0–0.7)
Eosinophils Relative: 1.6 % (ref 0.0–5.0)
HCT: 33.6 % — ABNORMAL LOW (ref 36.0–46.0)
Hemoglobin: 10.7 g/dL — ABNORMAL LOW (ref 12.0–15.0)
Lymphocytes Relative: 15.4 % (ref 12.0–46.0)
Lymphs Abs: 1.2 10*3/uL (ref 0.7–4.0)
MCHC: 31.7 g/dL (ref 30.0–36.0)
MCV: 84.5 fl (ref 78.0–100.0)
Monocytes Absolute: 0.7 10*3/uL (ref 0.1–1.0)
Monocytes Relative: 8.7 % (ref 3.0–12.0)
Neutro Abs: 5.5 10*3/uL (ref 1.4–7.7)
Neutrophils Relative %: 73.3 % (ref 43.0–77.0)
Platelets: 288 10*3/uL (ref 150.0–400.0)
RBC: 3.98 Mil/uL (ref 3.87–5.11)
RDW: 20.1 % — ABNORMAL HIGH (ref 11.5–15.5)
WBC: 7.6 10*3/uL (ref 4.0–10.5)

## 2020-06-06 LAB — VITAMIN B12: Vitamin B-12: 260 pg/mL (ref 211–911)

## 2020-06-06 NOTE — Assessment & Plan Note (Signed)
#   PREDiabetes/hyperglycemia S: Medication: metformin 500mg  daily but having diarrhea and low b12 level.  Metformin stopped earlier this year by me- unclear who restarted but hospitalist alos changed from prediabetes/insulin resistance to diabetes. I disagree with this. She had a 1x a1c of 6.5 in 2018 but has not had a second value that high so I am changing back to prediabetes/hyperglycemia Lab Results  Component Value Date   HGBA1C 6.1 (H) 05/29/2020   HGBA1C 5.9 (H) 01/01/2020   HGBA1C 6.1 10/05/2019   A/P: Patient with low B12 and diarrhea on Metformin-we will discontinue this and monitor A1c-if A1c gets above 6.5 above we will diagnosed as having diabetes but I would prefer to keep her off medicine as long as her A1c is under 7.5 due to side effects -We will recheck B12 level today and next visit

## 2020-06-06 NOTE — Assessment & Plan Note (Signed)
Atherosclerotic PVD with intermittent claudication (HCC) -No claudication at present but that is because patient has minimal mobility-will need to be reassessed as she becomes more active again.  Remain off Plavix for now (needed transfusion in hospital plus low baseline hemoglobin and ok per Dr. Allyson Sabal for now as long as on eliquis).  Continue please for LDL under 70-continue statin

## 2020-06-06 NOTE — Progress Notes (Signed)
PATIENT NAME: Cheryl Atkinson DOB: 08/21/1931 MRN: [id number]  PRIMARY CARE PROVIDER: Marin Olp, MD  RESPONSIBLE PARTY:  Acct ID - Guarantor Home Phone Work Phone Relationship Acct Type  [id number] - [place],[id number]  Self P/F     [address], [place], [place] [address]    PLAN OF CARE and INTERVENTIONS:               1.  GOALS OF CARE/ ADVANCE CARE PLANNING: Patient wants to be able to ambulate with her walker.                  2.  PATIENT/CAREGIVER EDUCATION:  Education on palliative care services, education on s/s of CHF, education on fall precautions, reviewed meds, support               3.  DISEASE STATUS: SW [place] and RN made scheduled palliative care home visit. Palliative care team met with patient,hired caregiver Cheryl Atkinson, patients sister Cheryl Atkinson and patients sister-in-law Cheryl Atkinson. Patient returned home from seeing  cardiologist this morning. Family reports cardiologist discontinued patients metformin. Patient was placed on iron 325 mg 1 tablet twice a week. Patient's past medical history includes but not limited to hypertension, AFib, PVD with intermittent claudication, renal artery stenosis, abdominal aortic ectasia, heart failure with preserved ejection fraction, COPD, pleural effusion due to CHF, GERD, multinodular goiter, osteoporosis, history of UTIs, iron deficiency anemia, restless leg syndrome, low back pain, bilateral hip pain, chronic constipation, hyperglycemia and spine spinal stenosis of lumbar region at multiple levels. Patient has been hospitalized 3 times this year most recent being twice in June for respiratory failure and hypoxia. Patient denies having any pain at the present time. Patient does not use oxygen. Family reports patients weight this morning at MD's office was 124.3 pounds. Patient is 5'6 in height. Family reports patient weight loss most likely related to edema subsiding. Patient reports her goal is to ambulate better and that goal was at the  top of her her list.  Patient currently receiving Nursing. PT, and OT through [facility]. Patient reports she is able to stand and take a few steps with her walker. Family used wheelchair to take patient to MD appointment this morning. Patient reports her appetite is good and patient on a no salt diet. Patient denies having any cough or shortness of breath. Patient deny suffering any recent falls. Patient reports she scratched her leg right leg (lower) on chair and has a Band-Aid covering area. Patient has trace edema in her lower extremities. Patient reports she sleeps fairly well at night after taking her trazodone. Family reports patient naps a lot during the day. Patient reports Cheryl Atkinson fills her medication box. Patient has copy of most form in home. Patient had DNR but did not bring form with her from the hospital. SW to contact MD's office regarding DNR. Nurse reviewed patient's medications with patient and family. Patient has Life Line but does not have necklace on during visit.  Palliative care team encouraged patient to wear her Life Line. Patient and family in agreement with palliative care services. Patient, family and caregiver encouraged to contact palliative care with questions or concerns.     HISTORY OF PRESENT ILLNESS:  Patient is a 86 year old female who resides in home with her son.  Patients sister Cheryl Atkinson and caregiver Cheryl Atkinson in home with patient.  Patient and family open to palliative care services.  Patient will be followed by palliative care and  will be seen monthly and PRN.  CODE STATUS: DNR  ADVANCED DIRECTIVES: Patient has Lifeline but is not currently wearing.  Patient encouraged to wear her Lifeline. MOST FORM: Y PPS: 40%   PHYSICAL EXAM:   VITALS: Today's Vitals   06/06/20 1147  Weight: 124 lb 4.8 oz (56.4 kg)  Height: '5\' 6"'  (1.676 m)  PainSc: 0-No pain    LUNGS: decreased breath sounds CARDIAC: Cor irreg, irreg RRR  EXTREMITIES: Trace  edema SKIN: bruising to upper extremities  NEURO: positive for gait problems and weakness       Nilda Simmer, RN

## 2020-06-06 NOTE — Assessment & Plan Note (Addendum)
Patient with baseline iron deficiency anemia-baseline hemoglobin of around 9 but on admission was down to 6 and she was transfused 1 unit of packed red blood cells and her hemoglobin remained stable.  He was given a dose of IV Feraheme and she was to continue oral ferrous sulfate at home.  There was concern with her being on Eliquis and Plavix in combination that this may increase her bleeding risk.  I was able to consult with Dr. Allyson Sabal over the weekend through epic-he was okay with me stopping Plavix for peripheral arterial disease while she is on Eliquis. -not taking iron at home. Will restart twice a week -hopefully being off plavix will help -Reviewed last notes from GI and had two adenomatous polyps in 2011. Also had AVMs. Dr. Christella Hartigan letter in 2015 recommended GI follow-up.  I do not think she would be a great candidate for colonoscopy at this time we will refer her back just for their formal recommendation

## 2020-06-06 NOTE — Progress Notes (Signed)
Phone [phone]   Subjective:  Cheryl Atkinson is a 86 y.o. year old very pleasant female patient who presents for transitional care management and hospital follow up for Hypoxia. Patient was hospitalized from 05/27/20 to 05/31/20. A TCM phone call was completed on July 2. Medical complexity moderate   Patient was hospitalized with acute respiratory failure with hypoxia likely related to acute diastolic heart failure with right-sided pleural effusion.  Patient was aggressively diuresed.  Thoracentesis was performed with 900 cc removed-culture was negative.  Thought to be a transudate.  When patient was weaned from oxygen-Lasix was transitioned to oral and she was sent home on twice daily Lasix but she states now on once daily.  -breathing has been good since being home -no increased swelling since being home -hasnt been able to stand yet for the scale- with with physical therapy to get stronger and she knows importance of weighing daily when able.   For her known atrial fibrillation-she was rate controlled without medication and continued on Eliquis  Patient with baseline iron deficiency anemia-baseline hemoglobin of around 9 but on admission was down to 6 and she was transfused 1 unit of packed red blood cells and her hemoglobin remained stable.  He was given a dose of IV Feraheme and she was to continue oral ferrous sulfate at home.  There was concern with her being on Eliquis and Plavix in combination that this may increase her bleeding risk.  I was able to consult with Dr. Allyson Sabal over the weekend through epic-he was okay with me stopping Plavix for peripheral arterial disease while she is on Eliquis. -not taking iron at home. Will restart twice a week  For peripheral arterial disease with history of claudication and also with renal artery stenosis unilateral.  As above Plavix has been stopped and patient will remain on Eliquis for now.  We will continue follow-up with Dr. Allyson Sabal  Restless  leg-patient was continued on Neupro 3 mg patch- consider updating ferritin levels in the future.    For hypertension-patient was continued on irbesartan 300 mg as well as Lasix 40 mg BP Readings from Last 3 Encounters:  06/06/20 122/70  05/31/20 (!) 104/47  05/26/20 130/67    Patient does take potassium 20 mEq each time she takes Lasix  For hyperlipidemia-atorvastatin 40 mg was continued-LDL a few months ago month excellent at 55 Lab Results  Component Value Date   CHOL 237 (H) 10/05/2019   HDL 92.00 10/05/2019   LDLCALC 129 (H) 10/05/2019   LDLDIRECT 55.0 03/15/2020   TRIG 79.0 10/05/2019   CHOLHDL 3 10/05/2019    Patient with COPD-on uses sparing albuterol  # PREDiabetes/hyperglycemia S: Medication: metformin 500mg  daily but having diarrhea and low b12 level.  Metformin stopped earlier this year by me- unclear who restarted but hospitalist alos changed from prediabetes/insulin resistance to diabetes. I disagree with this. She had a 1x a1c of 6.5 in 2018 but has not had a second value that high so I am changing back to prediabetes/hyperglycemia Lab Results  Component Value Date   HGBA1C 6.1 (H) 05/29/2020   HGBA1C 5.9 (H) 01/01/2020   HGBA1C 6.1 10/05/2019   A/P: Patient with low B12 and diarrhea on Metformin-we will discontinue this and monitor A1c-if A1c gets above 6.5 above we will diagnosed as having diabetes but I would prefer to keep her off medicine as long as her A1c is under 7.5 due to side effects -We will recheck B12 level today and next visit  See problem oriented charting as well  Past Medical History-  Patient Active Problem List   Diagnosis Date Noted  . Pleural effusion due to CHF (congestive heart failure) (HCC) 05/26/2020    Priority: High  . Heart failure with preserved ejection fraction (HCC) 03/01/2020    Priority: High  . Bacteremia 01/28/2020    Priority: High  . Atrial fibrillation (HCC) 01/25/2020    Priority: High  . Memory loss 04/11/2016     Priority: High  . Renal artery stenosis (HCC) 09/27/2015    Priority: High  . Claudication (HCC) 05/04/2015    Priority: High  . Atherosclerotic PVD with intermittent claudication (HCC) 04/26/2015    Priority: High  . DNR (do not resuscitate) 09/29/2014    Priority: High  . Hyperglycemia 07/19/2014    Priority: High  . LOW BACK PAIN 09/25/2007    Priority: High  . Insomnia 03/01/2020    Priority: Medium  . Major depression 03/25/2018    Priority: Medium  . BPPV (benign paroxysmal positional vertigo) 07/12/2016    Priority: Medium  . Hyperlipidemia 06/30/2015    Priority: Medium  . Former smoker 09/29/2014    Priority: Medium  . COPD (chronic obstructive pulmonary disease) (HCC) 09/20/2009    Priority: Medium  . Iron deficiency anemia 11/09/2007    Priority: Medium  . RESTLESS LEG SYNDROME 09/25/2007    Priority: Medium  . Essential hypertension 09/25/2007    Priority: Medium  . GERD 09/25/2007    Priority: Medium  . Osteoporosis 09/25/2007    Priority: Medium  . S/P laparoscopic cholecystectomy 04/04/2020    Priority: Low  . Abdominal aortic ectasia (HCC) 12/29/2019    Priority: Low  . Cat bite of right hand 12/21/2016    Priority: Low  . Mallet toe of right foot 10/20/2015    Priority: Low  . Tinnitus 12/31/2014    Priority: Low  . Multinodular goiter 08/30/2013    Priority: Low  . Spinal stenosis of lumbar region at multiple levels 09/29/2012    Priority: Low  . HIP PAIN, BILATERAL 07/16/2010    Priority: Low  . CONSTIPATION, CHRONIC 09/20/2009    Priority: Low  . History of UTI 11/09/2007    Priority: Low  . Protein-calorie malnutrition, severe 05/31/2020  . Adrenal nodule (HCC) 12/29/2019    Medications- reviewed and updated  A medical reconciliation was performed comparing current medicines to hospital discharge medications. Current Outpatient Medications  Medication Sig Dispense Refill  . Albuterol Sulfate (PROAIR RESPICLICK) 108 (90 Base) MCG/ACT  AEPB Inhale 2 puffs into the lungs every 6 (six) hours as needed (shortness of breath from COPD). 1 each 5  . apixaban (ELIQUIS) 5 MG TABS tablet Take 1 tablet (5 mg total) by mouth 2 (two) times daily. Start 04/06/20 60 tablet 11  . atorvastatin (LIPITOR) 40 MG tablet Take 1 tablet (40 mg total) by mouth daily. 90 tablet 3  . bisacodyl (DULCOLAX) 10 MG suppository Place 1 suppository (10 mg total) rectally daily as needed for moderate constipation. 12 suppository 0  . furosemide (LASIX) 40 MG tablet Take 1 tablet (40 mg total) by mouth daily. 30 tablet 0  . irbesartan (AVAPRO) 300 MG tablet Take 1 tablet (300 mg total) by mouth daily. 90 tablet 3  . Multiple Vitamins-Minerals (PRESERVISION AREDS 2 PO) Take 1 tablet by mouth 2 (two) times daily.    Marland Kitchen NEUPRO 3 MG/24HR PT24 PLACE 1 PATCH (3 MG) ONTO THE SKIN AT BEDTIME (Patient taking differently: Apply 1 patch  topically at bedtime. Take off in the morning) 90 patch 3  . polyethylene glycol (MIRALAX / GLYCOLAX) 17 g packet Take 17 g by mouth 2 (two) times daily. Reported on 03/14/2016 (Patient taking differently: Take 17 g by mouth daily as needed for mild constipation. )    . potassium chloride SA (KLOR-CON) 20 MEQ tablet Take 1 tablet (20 mEq total) by mouth daily. Needs to be taken on days Lasix is taken 30 tablet 0  . traZODone (DESYREL) 50 MG tablet Take 1.5 tablets (75 mg total) by mouth at bedtime as needed for sleep. 20 tablet 0   No current facility-administered medications for this visit.   Objective  Objective:  BP 122/70   Pulse 80   Temp 98.4 F (36.9 C)   Ht 5\' 6"  (1.676 m)   Wt 124 lb 3.2 oz (56.3 kg)   LMP  (LMP Unknown)   SpO2 95%   BMI 20.05 kg/m  Gen: NAD, resting comfortably CV: RRR no murmurs rubs or gallops Lungs: CTAB no crackles, wheeze, rhonchi-except for decreased breath sounds in right lower lung Abdomen: soft/nontender/nondistended/normal bowel sounds. Ext: no edema Skin: warm, dry    Assessment and Plan:    Chronic heart failure with preserved ejection fraction (HCC) - Plan: CBC with Differential/Platelet, Comprehensive metabolic panel Pleural effusion - Plan: DG Chest 2 View -Appears much improved.  No edema noted.  No shortness of breath.  Does still have some decreased breath sounds in right lower lobe -Continue Lasix 40 mg daily.  If patient has increase in weight or shortness of breath she should contact immediately -We will repeat chest x-ray in 1 month-encouraged her to stop by [facility] location to complete this  Paroxysmal atrial fibrillation (HCC) -Rate controlled on repeat without medication.  Appropriately anticoagulated with Eliquis.  Now off Plavix until follow-up with Dr. [place]  Atherosclerotic PVD with intermittent claudication (HCC) -No claudication at present but that is because patient has minimal mobility-will need to be reassessed as she becomes more active again.  Remain off Plavix for now (needed transfusion in hospital plus low baseline hemoglobin and ok per Dr. Allyson Sabal for now as long as on eliquis).  Continue please for LDL under 70-continue statin  Mixed hyperlipidemia -As above LDL target under 70.  Last LDL was 55 in April 2021.  Continue statin   Essential hypertension -Controlled with combination of ARB and Lasix.  Continue current medication Chronic obstructive pulmonary disease, unspecified COPD type (HCC)  High risk medication use - Plan: Vitamin B12 -Patient reports receiving B12 injection in the hospital.  Metformin probably increases her likelihood of low B12-low B12 may make it harder for her with balance and regaining strength-prefer for her to be off Metformin.  Will check B12 today and may need more frequent injections it has not significantly increased -Consider B12 and follow-up as well make sure maintaining levels since I am not starting oral B12 but simply stopping Metformin  Hyperglycemia # PREDiabetes/hyperglycemia S: Medication: metformin 500mg   daily but having diarrhea and low b12 level.  Metformin stopped earlier this year by me- unclear who restarted but hospitalist alos changed from prediabetes/insulin resistance to diabetes. I disagree with this. She had a 1x a1c of 6.5 in 2018 but has not had a second value that high so I am changing back to prediabetes/hyperglycemia Lab Results  Component Value Date   HGBA1C 6.1 (H) 05/29/2020   HGBA1C 5.9 (H) 01/01/2020   HGBA1C 6.1 10/05/2019   A/P: Patient with  low B12 and diarrhea on Metformin-we will discontinue this and monitor A1c-if A1c gets above 6.5 above we will diagnosed as having diabetes but I would prefer to keep her off medicine as long as her A1c is under 7.5 due to side effects -We will recheck B12 level today and next visit  Iron deficiency anemia Patient with baseline iron deficiency anemia-baseline hemoglobin of around 9 but on admission was down to 6 and she was transfused 1 unit of packed red blood cells and her hemoglobin remained stable.  He was given a dose of IV Feraheme and she was to continue oral ferrous sulfate at home.  There was concern with her being on Eliquis and Plavix in combination that this may increase her bleeding risk.  I was able to consult with Dr. Allyson SabalBerry over the weekend through epic-he was okay with me stopping Plavix for peripheral arterial disease while she is on Eliquis. -not taking iron at home. Will restart twice a week -hopefully being off plavix will help -Reviewed last notes from GI and had two adenomatous polyps in 2011. Also had AVMs. Dr. Christella HartiganJacobs letter in 2015 recommended GI follow-up.  I do not think she would be a great candidate for colonoscopy at this time we will refer her back just for their formal recommendation   Recommended follow up: Return in about 6 weeks (around 07/18/2020) for follow up. Future Appointments  Date Time Provider Department Center  06/14/2020  9:00 AM [facility] 1 [facility]  06/14/2020 10:00 AM [facility]  [facility]  12/27/2020 12:45 PM Sherrie GeorgeMatthews, John D, MD [facility] None    Lab/Order associations:   ICD-10-CM   1. Chronic heart failure with preserved ejection fraction (HCC)  I50.32 CBC with Differential/Platelet    Comprehensive metabolic panel  2. Hyperglycemia  R73.9   3. Paroxysmal atrial fibrillation (HCC)  I48.0   4. Atherosclerotic PVD with intermittent claudication (HCC)  I70.219   5. Iron deficiency anemia, unspecified iron deficiency anemia type  D50.9 Ambulatory referral to Gastroenterology  6. Mixed hyperlipidemia  E78.2   7. Essential hypertension  I10   8. Chronic obstructive pulmonary disease, unspecified COPD type (HCC)  J44.9   9. High risk medication use  Z79.899 Vitamin B12  10. Pleural effusion  J90 DG Chest 2 View   Return precautions advised.  Tana ConchStephen Analya Louissaint, MD

## 2020-06-06 NOTE — Patient Instructions (Addendum)
Please stop by lab before you go If you have mychart- we will send your results within 3 business days of [place] receiving them.  If you do not have mychart- we will call you about results within 5 business days of [place] receiving them.     Please go to [facility]  central X-ray  In 1 month  - located [address] across the street from [facility] - in the basement - Hours: 8:30-5:00 PM M-F (with lunch from 12:30- 1 PM). You do NOT need an appointment. - Please ensure you are covid symptom free before going in(No fever, chills, cough, congestion, runny nose, shortness of breath, fatigue, body aches, sore throat, headache, nausea, vomiting, diarrhea, or new loss of taste or smell. No known contacts with covid 19 or someone being tested for covid 19)  Take Ferrous Sulfate (iron) 325Mg  twice a week.  We will call you within two weeks about your referral to Gastroenterolgy. If you do not hear within 3 weeks, give a call.

## 2020-06-06 NOTE — Progress Notes (Signed)
COMMUNITY PALLIATIVE CARE SW NOTE  PATIENT NAME: Cheryl Atkinson DOB: 05/24/1931 MRN: [id number]  PRIMARY CARE PROVIDER: Marin Olp, MD  RESPONSIBLE PARTY:  Acct ID - Guarantor Home Phone Work Phone Relationship Acct Type  [id number] STACYE, [phone]  Self P/F     [address], [place], [place] [address]     PLAN OF CARE and INTERVENTIONS:             1. GOALS OF CARE/ ADVANCE CARE PLANNING:  Patient is a DNR. MOST form completed Patient has living will. Patients goal is to remain in her home and be able to ambulate with her RW as before.  2. SOCIAL/EMOTIONAL/SPIRITUAL ASSESSMENT/ INTERVENTIONS: SW and RN met with patient in her home for scheduled in home visit. Patients sister and sister in-law present as well as caregiver [name]. Patient is a widow of a retired Scientist, research (life sciences).Patient has a lot of small pets (4 small dogs and 2 cats) Patient shared that she admitted into the hospital week before last due to CHF and COPD and was discharged on Wed 6/30. Patient shared that she has received in home therapy and nursing services since she has discharged from the hospital. Per patient she has had 3 hospitalizations this year. Patient shares that she sleeps okay and takes "sleeping pills" - Trazadone. Patient naps during the day. Patient has a good appetite and is not in any pain at this time. Patient current weight is 134lb.RNreviewed medications and checked vitals. SW provided information and education on palliative care services and support.. Patient and family had no concerns at this time. Will continue to follow. 3. PATIENT/CAREGIVER EDUCATION/ COPING:  Patient has 2 children and 2 grandchildren. ! Son lives with patient. Patients cousin Neoma Laming also stays with patient during the day while son is at work. 4. PERSONAL EMERGENCY PLAN:  Call 9-1-1 for emergencies. Patient has life alert system. 5. COMMUNITY RESOURCES COORDINATION/ HEALTH CARE NAVIGATION:  Patients cousin, [name]  And  son [name] is over her care. [facility] providing PT/OT/Nursing services. 6. FINANCIAL/LEGAL CONCERNS/INTERVENTIONS:  None.     SOCIAL HX:  Social History   Tobacco Use  . Smoking status: Former Smoker    Packs/day: 0.50    Years: 60.00    Pack years: 30.00    Types: Cigarettes    Quit date: 04/02/2015    Years since quitting: 5.1  . Smokeless tobacco: Never Used  Substance Use Topics  . Alcohol use: No    CODE STATUS: DNR  ADVANCED DIRECTIVES: Y - living Will per familiy. MOST FORM COMPLETE:  Y HOSPICE EDUCATION PROVIDED: Y  KJZ:PHXTAVW is not able to ambulate with RW at this time, uses WC for mobility. Patient is able to SPT with assistance. Requires assistance with all ADL's. Patient has WC, RW, BSC , walk in handicap shower and hospital bed.  Time Spent: Visit lasted 40 min.    Doreene Eland, [place]

## 2020-06-07 DIAGNOSIS — Z4803 Encounter for change or removal of drains: Secondary | ICD-10-CM | POA: Diagnosis not present

## 2020-06-07 DIAGNOSIS — K651 Peritoneal abscess: Secondary | ICD-10-CM | POA: Diagnosis not present

## 2020-06-07 DIAGNOSIS — K9189 Other postprocedural complications and disorders of digestive system: Secondary | ICD-10-CM | POA: Diagnosis not present

## 2020-06-07 DIAGNOSIS — F0390 Unspecified dementia without behavioral disturbance: Secondary | ICD-10-CM | POA: Diagnosis not present

## 2020-06-07 DIAGNOSIS — I5032 Chronic diastolic (congestive) heart failure: Secondary | ICD-10-CM | POA: Diagnosis not present

## 2020-06-07 DIAGNOSIS — I110 Hypertensive heart disease with heart failure: Secondary | ICD-10-CM | POA: Diagnosis not present

## 2020-06-08 DIAGNOSIS — Z4803 Encounter for change or removal of drains: Secondary | ICD-10-CM | POA: Diagnosis not present

## 2020-06-08 DIAGNOSIS — I110 Hypertensive heart disease with heart failure: Secondary | ICD-10-CM | POA: Diagnosis not present

## 2020-06-08 DIAGNOSIS — I5032 Chronic diastolic (congestive) heart failure: Secondary | ICD-10-CM | POA: Diagnosis not present

## 2020-06-08 DIAGNOSIS — K9189 Other postprocedural complications and disorders of digestive system: Secondary | ICD-10-CM | POA: Diagnosis not present

## 2020-06-08 DIAGNOSIS — K651 Peritoneal abscess: Secondary | ICD-10-CM | POA: Diagnosis not present

## 2020-06-08 DIAGNOSIS — F0390 Unspecified dementia without behavioral disturbance: Secondary | ICD-10-CM | POA: Diagnosis not present

## 2020-06-11 DIAGNOSIS — G4700 Insomnia, unspecified: Secondary | ICD-10-CM | POA: Diagnosis not present

## 2020-06-11 DIAGNOSIS — J449 Chronic obstructive pulmonary disease, unspecified: Secondary | ICD-10-CM | POA: Diagnosis not present

## 2020-06-11 DIAGNOSIS — K651 Peritoneal abscess: Secondary | ICD-10-CM | POA: Diagnosis not present

## 2020-06-11 DIAGNOSIS — I110 Hypertensive heart disease with heart failure: Secondary | ICD-10-CM | POA: Diagnosis not present

## 2020-06-11 DIAGNOSIS — Z7901 Long term (current) use of anticoagulants: Secondary | ICD-10-CM | POA: Diagnosis not present

## 2020-06-11 DIAGNOSIS — I5033 Acute on chronic diastolic (congestive) heart failure: Secondary | ICD-10-CM | POA: Diagnosis not present

## 2020-06-11 DIAGNOSIS — G2581 Restless legs syndrome: Secondary | ICD-10-CM | POA: Diagnosis not present

## 2020-06-11 DIAGNOSIS — L89621 Pressure ulcer of left heel, stage 1: Secondary | ICD-10-CM | POA: Diagnosis not present

## 2020-06-11 DIAGNOSIS — M6281 Muscle weakness (generalized): Secondary | ICD-10-CM | POA: Diagnosis not present

## 2020-06-11 DIAGNOSIS — F0390 Unspecified dementia without behavioral disturbance: Secondary | ICD-10-CM | POA: Diagnosis not present

## 2020-06-11 DIAGNOSIS — J90 Pleural effusion, not elsewhere classified: Secondary | ICD-10-CM | POA: Diagnosis not present

## 2020-06-11 DIAGNOSIS — E8881 Metabolic syndrome: Secondary | ICD-10-CM | POA: Diagnosis not present

## 2020-06-11 DIAGNOSIS — I480 Paroxysmal atrial fibrillation: Secondary | ICD-10-CM | POA: Diagnosis not present

## 2020-06-11 DIAGNOSIS — Z4803 Encounter for change or removal of drains: Secondary | ICD-10-CM | POA: Diagnosis not present

## 2020-06-11 DIAGNOSIS — K9189 Other postprocedural complications and disorders of digestive system: Secondary | ICD-10-CM | POA: Diagnosis not present

## 2020-06-11 DIAGNOSIS — R2681 Unsteadiness on feet: Secondary | ICD-10-CM | POA: Diagnosis not present

## 2020-06-12 ENCOUNTER — Telehealth: Payer: Self-pay | Admitting: Family Medicine

## 2020-06-12 DIAGNOSIS — I110 Hypertensive heart disease with heart failure: Secondary | ICD-10-CM | POA: Diagnosis not present

## 2020-06-12 DIAGNOSIS — Z4803 Encounter for change or removal of drains: Secondary | ICD-10-CM | POA: Diagnosis not present

## 2020-06-12 DIAGNOSIS — K9189 Other postprocedural complications and disorders of digestive system: Secondary | ICD-10-CM | POA: Diagnosis not present

## 2020-06-12 DIAGNOSIS — I5033 Acute on chronic diastolic (congestive) heart failure: Secondary | ICD-10-CM | POA: Diagnosis not present

## 2020-06-12 DIAGNOSIS — K651 Peritoneal abscess: Secondary | ICD-10-CM | POA: Diagnosis not present

## 2020-06-12 DIAGNOSIS — J90 Pleural effusion, not elsewhere classified: Secondary | ICD-10-CM | POA: Diagnosis not present

## 2020-06-12 NOTE — Telephone Encounter (Signed)
Please advise 

## 2020-06-12 NOTE — Telephone Encounter (Signed)
Stanton Kidney is a care taker for ms.Arana, states that she has taken 40 mg of furosemide (LASIX) 40 MG tablet  and her feet are swelling fast. Asked for advice on what they need to do next.

## 2020-06-12 NOTE — Telephone Encounter (Signed)
Have her take Lasix 40 mg twice daily for the next few days and schedule follow-up on Thursday or Friday.  If she would prefer to come in now we can try to work her in on Tuesday potentially

## 2020-06-13 DIAGNOSIS — I5033 Acute on chronic diastolic (congestive) heart failure: Secondary | ICD-10-CM | POA: Diagnosis not present

## 2020-06-13 DIAGNOSIS — Z4803 Encounter for change or removal of drains: Secondary | ICD-10-CM | POA: Diagnosis not present

## 2020-06-13 DIAGNOSIS — K651 Peritoneal abscess: Secondary | ICD-10-CM | POA: Diagnosis not present

## 2020-06-13 DIAGNOSIS — I110 Hypertensive heart disease with heart failure: Secondary | ICD-10-CM | POA: Diagnosis not present

## 2020-06-13 DIAGNOSIS — K9189 Other postprocedural complications and disorders of digestive system: Secondary | ICD-10-CM | POA: Diagnosis not present

## 2020-06-13 DIAGNOSIS — J90 Pleural effusion, not elsewhere classified: Secondary | ICD-10-CM | POA: Diagnosis not present

## 2020-06-13 NOTE — Telephone Encounter (Signed)
Called and spoke with Stanton Kidney and gave her below information, she will call back to schedule f/u and knows to call if any questions.

## 2020-06-14 ENCOUNTER — Ambulatory Visit (HOSPITAL_BASED_OUTPATIENT_CLINIC_OR_DEPARTMENT_OTHER)
Admission: RE | Admit: 2020-06-14 | Discharge: 2020-06-14 | Disposition: A | Discharge status: Home / Self Care | Payer: Medicare Other | Source: Ambulatory Visit | Attending: Cardiovascular Disease | Admitting: Cardiovascular Disease

## 2020-06-14 ENCOUNTER — Other Ambulatory Visit (HOSPITAL_COMMUNITY): Payer: Self-pay | Admitting: Cardiovascular Disease

## 2020-06-14 ENCOUNTER — Ambulatory Visit (HOSPITAL_COMMUNITY)
Admission: RE | Admit: 2020-06-14 | Discharge: 2020-06-14 | Disposition: A | Discharge status: Home / Self Care | Payer: Medicare Other | Source: Ambulatory Visit | Attending: Internal Medicine | Admitting: Internal Medicine

## 2020-06-14 ENCOUNTER — Other Ambulatory Visit: Payer: Self-pay

## 2020-06-14 DIAGNOSIS — I739 Peripheral vascular disease, unspecified: Secondary | ICD-10-CM

## 2020-06-14 DIAGNOSIS — K9189 Other postprocedural complications and disorders of digestive system: Secondary | ICD-10-CM | POA: Diagnosis not present

## 2020-06-14 DIAGNOSIS — I5033 Acute on chronic diastolic (congestive) heart failure: Secondary | ICD-10-CM | POA: Diagnosis not present

## 2020-06-14 DIAGNOSIS — K651 Peritoneal abscess: Secondary | ICD-10-CM | POA: Diagnosis not present

## 2020-06-14 DIAGNOSIS — I110 Hypertensive heart disease with heart failure: Secondary | ICD-10-CM | POA: Diagnosis not present

## 2020-06-14 DIAGNOSIS — J90 Pleural effusion, not elsewhere classified: Secondary | ICD-10-CM | POA: Diagnosis not present

## 2020-06-14 DIAGNOSIS — Z4803 Encounter for change or removal of drains: Secondary | ICD-10-CM | POA: Diagnosis not present

## 2020-06-14 DIAGNOSIS — Z95828 Presence of other vascular implants and grafts: Secondary | ICD-10-CM

## 2020-06-14 NOTE — Telephone Encounter (Signed)
Do you want her to follow up with another provider?

## 2020-06-14 NOTE — Telephone Encounter (Signed)
Patient called back and stated that swelling had went down but still was there. Dr. Durene Cal has no openings on Thursday or Friday. Please advise on when patient can be seen.

## 2020-06-14 NOTE — Telephone Encounter (Signed)
You can change 820 physical on Friday to 20 minutes and put her in at 8:40 AM

## 2020-06-15 DIAGNOSIS — K651 Peritoneal abscess: Secondary | ICD-10-CM | POA: Diagnosis not present

## 2020-06-15 DIAGNOSIS — J90 Pleural effusion, not elsewhere classified: Secondary | ICD-10-CM | POA: Diagnosis not present

## 2020-06-15 DIAGNOSIS — Z4803 Encounter for change or removal of drains: Secondary | ICD-10-CM | POA: Diagnosis not present

## 2020-06-15 DIAGNOSIS — I110 Hypertensive heart disease with heart failure: Secondary | ICD-10-CM | POA: Diagnosis not present

## 2020-06-15 DIAGNOSIS — K9189 Other postprocedural complications and disorders of digestive system: Secondary | ICD-10-CM | POA: Diagnosis not present

## 2020-06-15 DIAGNOSIS — I5033 Acute on chronic diastolic (congestive) heart failure: Secondary | ICD-10-CM | POA: Diagnosis not present

## 2020-06-15 NOTE — Telephone Encounter (Signed)
Patient has been scheduled

## 2020-06-15 NOTE — Telephone Encounter (Signed)
Can you make changes

## 2020-06-16 ENCOUNTER — Encounter: Payer: Self-pay | Admitting: Family Medicine

## 2020-06-16 ENCOUNTER — Ambulatory Visit (INDEPENDENT_AMBULATORY_CARE_PROVIDER_SITE_OTHER): Payer: Medicare Other | Admitting: Family Medicine

## 2020-06-16 ENCOUNTER — Other Ambulatory Visit: Payer: Self-pay

## 2020-06-16 VITALS — BP 130/64 | HR 84 | Temp 98.1°F | Ht 66.0 in | Wt 135.2 lb

## 2020-06-16 DIAGNOSIS — I10 Essential (primary) hypertension: Secondary | ICD-10-CM

## 2020-06-16 DIAGNOSIS — I70219 Atherosclerosis of native arteries of extremities with intermittent claudication, unspecified extremity: Secondary | ICD-10-CM

## 2020-06-16 DIAGNOSIS — I5032 Chronic diastolic (congestive) heart failure: Secondary | ICD-10-CM | POA: Diagnosis not present

## 2020-06-16 LAB — COMPREHENSIVE METABOLIC PANEL
ALT: 15 U/L (ref 0–35)
AST: 16 U/L (ref 0–37)
Albumin: 3.6 g/dL (ref 3.5–5.2)
Alkaline Phosphatase: 102 U/L (ref 39–117)
BUN: 24 mg/dL — ABNORMAL HIGH (ref 6–23)
CO2: 29 mEq/L (ref 19–32)
Calcium: 9 mg/dL (ref 8.4–10.5)
Chloride: 101 mEq/L (ref 96–112)
Creatinine, Ser: 0.65 mg/dL (ref 0.40–1.20)
GFR: 86.26 mL/min (ref >60.00)
Glucose, Bld: 110 mg/dL — ABNORMAL HIGH (ref 70–99)
Potassium: 4 mEq/L (ref 3.5–5.1)
Sodium: 138 mEq/L (ref 135–145)
Total Bilirubin: 0.5 mg/dL (ref 0.2–1.2)
Total Protein: 6.7 g/dL (ref 6.0–8.3)

## 2020-06-16 LAB — CBC WITH DIFFERENTIAL/PLATELET
Basophils Absolute: 0.1 10*3/uL (ref 0.0–0.1)
Basophils Relative: 0.9 % (ref 0.0–3.0)
Eosinophils Absolute: 0.1 10*3/uL (ref 0.0–0.7)
Eosinophils Relative: 1 % (ref 0.0–5.0)
HCT: 34.2 % — ABNORMAL LOW (ref 36.0–46.0)
Hemoglobin: 11.1 g/dL — ABNORMAL LOW (ref 12.0–15.0)
Lymphocytes Relative: 15.7 % (ref 12.0–46.0)
Lymphs Abs: 1.1 10*3/uL (ref 0.7–4.0)
MCHC: 32.4 g/dL (ref 30.0–36.0)
MCV: 85.1 fl (ref 78.0–100.0)
Monocytes Absolute: 0.5 10*3/uL (ref 0.1–1.0)
Monocytes Relative: 7.7 % (ref 3.0–12.0)
Neutro Abs: 5.1 10*3/uL (ref 1.4–7.7)
Neutrophils Relative %: 74.7 % (ref 43.0–77.0)
Platelets: 295 10*3/uL (ref 150.0–400.0)
RBC: 4.02 Mil/uL (ref 3.87–5.11)
RDW: 22 % — ABNORMAL HIGH (ref 11.5–15.5)
WBC: 6.9 10*3/uL (ref 4.0–10.5)

## 2020-06-16 MED ORDER — POTASSIUM CHLORIDE CRYS ER 20 MEQ PO TBCR: 20.0000 meq | EXTENDED_RELEASE_TABLET | Freq: Two times a day (BID) | ORAL | 5 refills | Status: DC | PRN

## 2020-06-16 MED ORDER — FUROSEMIDE 40 MG PO TABS: 40.0000 mg | ORAL_TABLET | Freq: Two times a day (BID) | ORAL | 5 refills | Status: DC

## 2020-06-16 NOTE — Progress Notes (Signed)
Phone [phone] In person visit   Subjective:   Cheryl Atkinson is a 86 y.o. year old very pleasant female patient who presents for/with See problem oriented charting Chief Complaint  Patient presents with  . Leg Swelling    This visit occurred during the SARS-CoV-2 public health emergency.  Safety protocols were in place, including screening questions prior to the visit, additional usage of staff PPE, and extensive cleaning of exam room while observing appropriate contact time as indicated for disinfecting solutions.   Past Medical History-  Patient Active Problem List   Diagnosis Date Noted  . Pleural effusion due to CHF (congestive heart failure) (HCC) 05/26/2020    Priority: High  . Heart failure with preserved ejection fraction (HCC) 03/01/2020    Priority: High  . Bacteremia 01/28/2020    Priority: High  . Atrial fibrillation (HCC) 01/25/2020    Priority: High  . Memory loss 04/11/2016    Priority: High  . Renal artery stenosis (HCC) 09/27/2015    Priority: High  . Claudication (HCC) 05/04/2015    Priority: High  . Atherosclerotic PVD with intermittent claudication (HCC) 04/26/2015    Priority: High  . DNR (do not resuscitate) 09/29/2014    Priority: High  . Hyperglycemia 07/19/2014    Priority: High  . LOW BACK PAIN 09/25/2007    Priority: High  . Insomnia 03/01/2020    Priority: Medium  . Major depression 03/25/2018    Priority: Medium  . BPPV (benign paroxysmal positional vertigo) 07/12/2016    Priority: Medium  . Hyperlipidemia 06/30/2015    Priority: Medium  . Former smoker 09/29/2014    Priority: Medium  . COPD (chronic obstructive pulmonary disease) (HCC) 09/20/2009    Priority: Medium  . Iron deficiency anemia 11/09/2007    Priority: Medium  . RESTLESS LEG SYNDROME 09/25/2007    Priority: Medium  . Essential hypertension 09/25/2007    Priority: Medium  . GERD 09/25/2007    Priority: Medium  . Osteoporosis 09/25/2007    Priority: Medium  .  S/P laparoscopic cholecystectomy 04/04/2020    Priority: Low  . Abdominal aortic ectasia (HCC) 12/29/2019    Priority: Low  . Cat bite of right hand 12/21/2016    Priority: Low  . Mallet toe of right foot 10/20/2015    Priority: Low  . Tinnitus 12/31/2014    Priority: Low  . Multinodular goiter 08/30/2013    Priority: Low  . Spinal stenosis of lumbar region at multiple levels 09/29/2012    Priority: Low  . HIP PAIN, BILATERAL 07/16/2010    Priority: Low  . CONSTIPATION, CHRONIC 09/20/2009    Priority: Low  . History of UTI 11/09/2007    Priority: Low  . Protein-calorie malnutrition, severe 05/31/2020  . Adrenal nodule (HCC) 12/29/2019    Medications- reviewed and updated Current Outpatient Medications  Medication Sig Dispense Refill  . Albuterol Sulfate (PROAIR RESPICLICK) 108 (90 Base) MCG/ACT AEPB Inhale 2 puffs into the lungs every 6 (six) hours as needed (shortness of breath from COPD). 1 each 5  . apixaban (ELIQUIS) 5 MG TABS tablet Take 1 tablet (5 mg total) by mouth 2 (two) times daily. Start 04/06/20 60 tablet 11  . atorvastatin (LIPITOR) 40 MG tablet Take 1 tablet (40 mg total) by mouth daily. 90 tablet 3  . bisacodyl (DULCOLAX) 10 MG suppository Place 1 suppository (10 mg total) rectally daily as needed for moderate constipation. 12 suppository 0  . furosemide (LASIX) 40 MG tablet Take 1 tablet (40 mg  total) by mouth 2 (two) times daily. 60 tablet 5  . irbesartan (AVAPRO) 300 MG tablet Take 1 tablet (300 mg total) by mouth daily. 90 tablet 3  . Multiple Vitamins-Minerals (PRESERVISION AREDS 2 PO) Take 1 tablet by mouth 2 (two) times daily.    Marland Kitchen NEUPRO 3 MG/24HR PT24 PLACE 1 PATCH (3 MG) ONTO THE SKIN AT BEDTIME (Patient taking differently: Apply 1 patch topically at bedtime. Take off in the morning) 90 patch 3  . polyethylene glycol (MIRALAX / GLYCOLAX) 17 g packet Take 17 g by mouth 2 (two) times daily. Reported on 03/14/2016 (Patient taking differently: Take 17 g by mouth  daily as needed for mild constipation. )    . potassium chloride SA (KLOR-CON) 20 MEQ tablet Take 1 tablet (20 mEq total) by mouth 2 (two) times daily as needed. Needs to be taken  With each dose of lasix 60 tablet 5  . traZODone (DESYREL) 50 MG tablet Take 1.5 tablets (75 mg total) by mouth at bedtime as needed for sleep. 20 tablet 0   No current facility-administered medications for this visit.     Objective:  BP 130/64   Pulse 84   Temp 98.1 F (36.7 C)   Ht 5\' 6"  (1.676 m)   Wt 135 lb 4 oz (61.3 kg)   LMP  (LMP Unknown)   SpO2 97%   BMI 21.83 kg/m  Gen: NAD, resting comfortably CV: RRR no murmurs rubs or gallops Lungs: CTAB no crackles, wheeze, rhonchi Abdomen: soft/nontender/nondistended/normal bowel sounds.  Ext: 1+ bilateral  edema Skin: warm, dry Neuro: in wheelchair today    Assessment and Plan   #Heart failure with preserved ejection fraction/diastolic S: Medication:lasix 40mg  daily in general and for last 4 days has done twice a day. Had been noting significant worsening edema but that has stabilized and slightly improved with the higher dose.  Takes potassium with each dose of lasix.   Weight is up 11 lbs in last 10 days despite the increase in lasix. No shortness of breath or fatigue above baseline. Shortness of breath with lying down but still on 1 pillow- but elevated head of bed in hospital bed.   Still able to walk in the halls at home without assist.   She is watching salt intake.  A/P: Exacerbation of heart failure with weight up 11 pounds and significant edema-now improving with increased from once a day Lasix to twice a day Lasix 40 mg. - I dont think 40mg  once a day is going to be enough- possibly step back at next visit to 40 in AM and 20 in PM.     #Hypertension S: Compliant with irbesartan 300 mg (ok per cards as only unilateral) and lasix 40mg   Twice daily at the moment -off hctz since starting lasix -off amlodiine march 2021 with edema issues .   Blood pressure high in past with caffeine intake A/P: Stable. Continue current medications (lasix twice a day at present)    Recommended follow up: see avs- Tuesday after next Future Appointments  Date Time Provider Department Center  07/19/2020 11:20 AM , MD [facility]  07/25/2020 10:45 AM Saturday, MD [facility]  12/27/2020 12:45 PM 07/27/2020, MD [facility] None    Lab/Order associations:   ICD-10-CM   1. Chronic heart failure with preserved ejection fraction (HCC)  I50.32 CBC with Differential/Platelet    Comprehensive metabolic panel  2. Essential hypertension  I10     Meds ordered  this encounter  Medications  . furosemide (LASIX) 40 MG tablet    Sig: Take 1 tablet (40 mg total) by mouth 2 (two) times daily.    Dispense:  60 tablet    Refill:  5  . potassium chloride SA (KLOR-CON) 20 MEQ tablet    Sig: Take 1 tablet (20 mEq total) by mouth 2 (two) times daily as needed. Needs to be taken  With each dose of lasix    Dispense:  60 tablet    Refill:  5   Return precautions advised.  Tana Conch, MD

## 2020-06-16 NOTE — Patient Instructions (Addendum)
Continue lasix 40mg  twice a day. I sent in a new prescription with 40mg  dose. Take a dose of potassium twice a day while on lasix twice a day   Team schedule follow up July 27th at 9 20 AM- keep close eye on this and I am out of town next week- if worsening symptoms despite higher lasix- still call as one of my colleagues can see you  Please stop by lab before you go If you have mychart- we will send your results within 3 business days of July 29 receiving them.  If you do not have mychart- we will call you about results within 5 business days of 03-24-1969 receiving them.

## 2020-06-19 DIAGNOSIS — I110 Hypertensive heart disease with heart failure: Secondary | ICD-10-CM | POA: Diagnosis not present

## 2020-06-19 DIAGNOSIS — Z4803 Encounter for change or removal of drains: Secondary | ICD-10-CM | POA: Diagnosis not present

## 2020-06-19 DIAGNOSIS — K651 Peritoneal abscess: Secondary | ICD-10-CM | POA: Diagnosis not present

## 2020-06-19 DIAGNOSIS — J90 Pleural effusion, not elsewhere classified: Secondary | ICD-10-CM | POA: Diagnosis not present

## 2020-06-19 DIAGNOSIS — I5033 Acute on chronic diastolic (congestive) heart failure: Secondary | ICD-10-CM | POA: Diagnosis not present

## 2020-06-19 DIAGNOSIS — K9189 Other postprocedural complications and disorders of digestive system: Secondary | ICD-10-CM | POA: Diagnosis not present

## 2020-06-20 DIAGNOSIS — Z4803 Encounter for change or removal of drains: Secondary | ICD-10-CM | POA: Diagnosis not present

## 2020-06-20 DIAGNOSIS — K9189 Other postprocedural complications and disorders of digestive system: Secondary | ICD-10-CM | POA: Diagnosis not present

## 2020-06-20 DIAGNOSIS — I110 Hypertensive heart disease with heart failure: Secondary | ICD-10-CM | POA: Diagnosis not present

## 2020-06-20 DIAGNOSIS — K651 Peritoneal abscess: Secondary | ICD-10-CM | POA: Diagnosis not present

## 2020-06-20 DIAGNOSIS — I5033 Acute on chronic diastolic (congestive) heart failure: Secondary | ICD-10-CM | POA: Diagnosis not present

## 2020-06-20 DIAGNOSIS — J90 Pleural effusion, not elsewhere classified: Secondary | ICD-10-CM | POA: Diagnosis not present

## 2020-06-22 DIAGNOSIS — I110 Hypertensive heart disease with heart failure: Secondary | ICD-10-CM | POA: Diagnosis not present

## 2020-06-22 DIAGNOSIS — J90 Pleural effusion, not elsewhere classified: Secondary | ICD-10-CM | POA: Diagnosis not present

## 2020-06-22 DIAGNOSIS — I5033 Acute on chronic diastolic (congestive) heart failure: Secondary | ICD-10-CM | POA: Diagnosis not present

## 2020-06-22 DIAGNOSIS — K9189 Other postprocedural complications and disorders of digestive system: Secondary | ICD-10-CM | POA: Diagnosis not present

## 2020-06-22 DIAGNOSIS — K651 Peritoneal abscess: Secondary | ICD-10-CM | POA: Diagnosis not present

## 2020-06-22 DIAGNOSIS — Z4803 Encounter for change or removal of drains: Secondary | ICD-10-CM | POA: Diagnosis not present

## 2020-06-23 DIAGNOSIS — H43823 Vitreomacular adhesion, bilateral: Secondary | ICD-10-CM | POA: Diagnosis not present

## 2020-06-26 DIAGNOSIS — J90 Pleural effusion, not elsewhere classified: Secondary | ICD-10-CM | POA: Diagnosis not present

## 2020-06-26 DIAGNOSIS — I5033 Acute on chronic diastolic (congestive) heart failure: Secondary | ICD-10-CM | POA: Diagnosis not present

## 2020-06-26 DIAGNOSIS — K9189 Other postprocedural complications and disorders of digestive system: Secondary | ICD-10-CM | POA: Diagnosis not present

## 2020-06-26 DIAGNOSIS — Z4803 Encounter for change or removal of drains: Secondary | ICD-10-CM | POA: Diagnosis not present

## 2020-06-26 DIAGNOSIS — I110 Hypertensive heart disease with heart failure: Secondary | ICD-10-CM | POA: Diagnosis not present

## 2020-06-26 DIAGNOSIS — K651 Peritoneal abscess: Secondary | ICD-10-CM | POA: Diagnosis not present

## 2020-06-26 NOTE — Patient Instructions (Addendum)
Health Maintenance Due  Topic Date Due  . OPHTHALMOLOGY EXAM -goes Thursday, have them send [place] a copy. Never done   If weight increases or swelling/shortness of breath worsens please let me know.   Continue lasix 40mg  in the morning and then 20Mg  at 2:00pm.  Please stop by lab before you go If you have mychart- we will send your results within 3 business days of receiving them.  If you do not have mychart- we will call you about results within 5 business days of receiving them.

## 2020-06-26 NOTE — Progress Notes (Signed)
Phone [phone] In person visit   Subjective:   Cheryl Atkinson is a 86 y.o. year old very pleasant female patient who presents for/with See problem oriented charting Chief Complaint  Patient presents with  . Follow-up  . Hypertension   This visit occurred during the SARS-CoV-2 public health emergency.  Safety protocols were in place, including screening questions prior to the visit, additional usage of staff PPE, and extensive cleaning of exam room while observing appropriate contact time as indicated for disinfecting solutions.   Past Medical History-  Patient Active Problem List   Diagnosis Date Noted  . Pleural effusion due to CHF (congestive heart failure) (HCC) 05/26/2020    Priority: High  . Heart failure with preserved ejection fraction (HCC) 03/01/2020    Priority: High  . Bacteremia 01/28/2020    Priority: High  . Atrial fibrillation (HCC) 01/25/2020    Priority: High  . Memory loss 04/11/2016    Priority: High  . Renal artery stenosis (HCC) 09/27/2015    Priority: High  . Claudication (HCC) 05/04/2015    Priority: High  . Atherosclerotic PVD with intermittent claudication (HCC) 04/26/2015    Priority: High  . DNR (do not resuscitate) 09/29/2014    Priority: High  . Hyperglycemia 07/19/2014    Priority: High  . LOW BACK PAIN 09/25/2007    Priority: High  . Insomnia 03/01/2020    Priority: Medium  . Major depression 03/25/2018    Priority: Medium  . BPPV (benign paroxysmal positional vertigo) 07/12/2016    Priority: Medium  . Hyperlipidemia 06/30/2015    Priority: Medium  . Former smoker 09/29/2014    Priority: Medium  . COPD (chronic obstructive pulmonary disease) (HCC) 09/20/2009    Priority: Medium  . Iron deficiency anemia 11/09/2007    Priority: Medium  . RESTLESS LEG SYNDROME 09/25/2007    Priority: Medium  . Essential hypertension 09/25/2007    Priority: Medium  . GERD 09/25/2007    Priority: Medium  . Osteoporosis 09/25/2007     Priority: Medium  . S/P laparoscopic cholecystectomy 04/04/2020    Priority: Low  . Abdominal aortic ectasia (HCC) 12/29/2019    Priority: Low  . Cat bite of right hand 12/21/2016    Priority: Low  . Mallet toe of right foot 10/20/2015    Priority: Low  . Tinnitus 12/31/2014    Priority: Low  . Multinodular goiter 08/30/2013    Priority: Low  . Spinal stenosis of lumbar region at multiple levels 09/29/2012    Priority: Low  . HIP PAIN, BILATERAL 07/16/2010    Priority: Low  . CONSTIPATION, CHRONIC 09/20/2009    Priority: Low  . History of UTI 11/09/2007    Priority: Low  . Protein-calorie malnutrition, severe 05/31/2020  . Adrenal nodule (HCC) 12/29/2019    Medications- reviewed and updated Current Outpatient Medications  Medication Sig Dispense Refill  . Albuterol Sulfate (PROAIR RESPICLICK) 108 (90 Base) MCG/ACT AEPB Inhale 2 puffs into the lungs every 6 (six) hours as needed (shortness of breath from COPD). 1 each 5  . apixaban (ELIQUIS) 5 MG TABS tablet Take 1 tablet (5 mg total) by mouth 2 (two) times daily. Start 04/06/20 60 tablet 11  . atorvastatin (LIPITOR) 40 MG tablet Take 1 tablet (40 mg total) by mouth daily. 90 tablet 3  . bisacodyl (DULCOLAX) 10 MG suppository Place 1 suppository (10 mg total) rectally daily as needed for moderate constipation. 12 suppository 0  . furosemide (LASIX) 40 MG tablet Take 1 tablet (40  mg total) by mouth 2 (two) times daily. 60 tablet 5  . irbesartan (AVAPRO) 300 MG tablet Take 1 tablet (300 mg total) by mouth daily. 90 tablet 3  . Multiple Vitamins-Minerals (PRESERVISION AREDS 2 PO) Take 1 tablet by mouth 2 (two) times daily.    Marland Kitchen NEUPRO 3 MG/24HR PT24 PLACE 1 PATCH (3 MG) ONTO THE SKIN AT BEDTIME (Patient taking differently: Apply 1 patch topically at bedtime. Take off in the morning) 90 patch 3  . polyethylene glycol (MIRALAX / GLYCOLAX) 17 g packet Take 17 g by mouth 2 (two) times daily. Reported on 03/14/2016 (Patient taking  differently: Take 17 g by mouth daily as needed for mild constipation. )    . potassium chloride SA (KLOR-CON) 20 MEQ tablet Take 1 tablet (20 mEq total) by mouth 2 (two) times daily as needed. Needs to be taken  With each dose of lasix 60 tablet 5  . traZODone (DESYREL) 50 MG tablet Take 1.5 tablets (75 mg total) by mouth at bedtime as needed for sleep. 20 tablet 0   No current facility-administered medications for this visit.     Objective:  BP (!) 136/80   Pulse 66   Temp 98.3 F (36.8 C)   Ht 5\' 6"  (1.676 m)   Wt 136 lb 3.2 oz (61.8 kg)   LMP  (LMP Unknown)   SpO2 100%   BMI 21.98 kg/m  Gen: NAD, resting comfortably CV: Irregularly irregular no murmurs rubs or gallops Lungs: CTAB no crackles, wheeze, rhonchi Ext: Trace edema right foot, 1+ in left foot-no pretibial edema Skin: warm, dry Neuro: In a wheelchair today but reports has good mobility at home    Assessment and Plan      #Heart failure with preserved ejection fraction S: Medication: At last visit increase Lasix to 40 mg twice daily due to worsening edema-had also been doing this for days prior to visit.  Last visit her weight was up 11 pounds-today largely stable/up 1 pound. Edema much improved. Denies shortness of breath or orthopnea.   Wt Readings from Last 3 Encounters:  06/27/20 136 lb 3.2 oz (61.8 kg)  06/16/20 135 lb 4 oz (61.3 kg)  06/06/20 124 lb 4.8 oz (56.4 kg)   A/P: Significant improvement in edema noted despite no changes in weight.  Patient feels better overall.  She is bothered by the urinary frequency with higher Lasix dose and wants to try a lower dose if possible-we will trial 40 mg in the morning and then 6 hours later an additional 20 mg.  She will take 20 mEq of potassium with each Lasix full dose 40 mg in 10 meq with half dose or 20 mg -Instructed patient to watch closely for increasing edema or shortness of breath   #Hypertension S: Compliant with irbesartan 300 mg (ok per cards as only  unilateral) and lasix 40mg  twice daily-increased last visit.  Off amlodipine due to edema issues -off hctz since starting lasix -off amlodiine march 2021 with edema issues .  Blood pressure high in past with caffeine intake  BP Readings from Last 3 Encounters:  06/27/20 (!) 136/80  06/16/20 130/64  06/06/20 (!) 102/58   A/P: Reasonable control on repeat today-continue irbesartan 300 mg and Lasix 40 mg-40 mg in the a.m. and 20 mg 6 hours later  #Iron deficiency anemia-on July 6 we had referred patient to GI based on continued anemia and review of notes from Dr. 08/07/20 stating follow-up in 5 years from last colonoscopy  in 2011.  She did have AVMs and is on Eliquis which could increase risk of anemia/iron deficiency.  She does take iron. -After discussion today patient declines GI follow-up unless she has significant worsening of anemia.  She understands potential that we could be missing something like colon cancer but at her age and comorbidities she prefers to avoid GI consult at this time   Recommended follow up: Has an August visit scheduled-you can check in on blood pressure at that time as well as edema Future Appointments  Date Time Provider Department Center  06/29/2020  8:00 AM Sherrie George, MD [facility] None  07/13/2020  9:20 AM Shelva Majestic, MD [facility]  07/19/2020 11:20 AM Shelva Majestic, MD [facility]  07/25/2020 10:45 AM Runell Gess, MD [facility]  12/27/2020 12:45 PM Sherrie George, MD [facility] None    Lab/Order associations:   ICD-10-CM   1. Essential hypertension  I10   2. Chronic heart failure with preserved ejection fraction (HCC)  I50.32 Basic metabolic panel    CANCELED: Basic metabolic panel  3. Iron deficiency anemia, unspecified iron deficiency anemia type  D50.9     Return precautions advised.  Tana Conch, MD

## 2020-06-27 ENCOUNTER — Ambulatory Visit (INDEPENDENT_AMBULATORY_CARE_PROVIDER_SITE_OTHER): Payer: Medicare Other | Admitting: Family Medicine

## 2020-06-27 ENCOUNTER — Encounter: Payer: Self-pay | Admitting: Family Medicine

## 2020-06-27 ENCOUNTER — Other Ambulatory Visit: Payer: Self-pay

## 2020-06-27 VITALS — BP 136/80 | HR 66 | Temp 98.3°F | Ht 66.0 in | Wt 136.2 lb

## 2020-06-27 DIAGNOSIS — I5032 Chronic diastolic (congestive) heart failure: Secondary | ICD-10-CM

## 2020-06-27 DIAGNOSIS — K651 Peritoneal abscess: Secondary | ICD-10-CM | POA: Diagnosis not present

## 2020-06-27 DIAGNOSIS — Z4803 Encounter for change or removal of drains: Secondary | ICD-10-CM | POA: Diagnosis not present

## 2020-06-27 DIAGNOSIS — I70219 Atherosclerosis of native arteries of extremities with intermittent claudication, unspecified extremity: Secondary | ICD-10-CM

## 2020-06-27 DIAGNOSIS — D509 Iron deficiency anemia, unspecified: Secondary | ICD-10-CM

## 2020-06-27 DIAGNOSIS — I10 Essential (primary) hypertension: Secondary | ICD-10-CM

## 2020-06-27 DIAGNOSIS — K9189 Other postprocedural complications and disorders of digestive system: Secondary | ICD-10-CM | POA: Diagnosis not present

## 2020-06-27 DIAGNOSIS — I5033 Acute on chronic diastolic (congestive) heart failure: Secondary | ICD-10-CM | POA: Diagnosis not present

## 2020-06-27 DIAGNOSIS — I110 Hypertensive heart disease with heart failure: Secondary | ICD-10-CM | POA: Diagnosis not present

## 2020-06-27 DIAGNOSIS — J90 Pleural effusion, not elsewhere classified: Secondary | ICD-10-CM | POA: Diagnosis not present

## 2020-06-27 LAB — BASIC METABOLIC PANEL
BUN/Creatinine Ratio: 46 (calc) — ABNORMAL HIGH (ref 6–22)
BUN: 33 mg/dL — ABNORMAL HIGH (ref 7–25)
CO2: 29 mmol/L (ref 20–32)
Calcium: 9 mg/dL (ref 8.6–10.4)
Chloride: 102 mmol/L (ref 98–110)
Creat: 0.71 mg/dL (ref 0.60–0.88)
Glucose, Bld: 98 mg/dL (ref 65–99)
Potassium: 4 mmol/L (ref 3.5–5.3)
Sodium: 140 mmol/L (ref 135–146)

## 2020-06-27 NOTE — Assessment & Plan Note (Signed)
#  Heart failure with preserved ejection fraction S: Medication: At last visit increase Lasix to 40 mg twice daily due to worsening edema-had also been doing this for days prior to visit.  Last visit her weight was up 11 pounds-today largely stable/up 1 pound. Edema much improved. Denies shortness of breath or orthopnea.   Wt Readings from Last 3 Encounters:  06/27/20 136 lb 3.2 oz (61.8 kg)  06/16/20 135 lb 4 oz (61.3 kg)  06/06/20 124 lb 4.8 oz (56.4 kg)   A/P: Significant improvement in edema noted despite no changes in weight.  Patient feels better overall.  She is bothered by the urinary frequency with higher Lasix dose and wants to try a lower dose if possible-we will trial 40 mg in the morning and then 6 hours later an additional 20 mg.  She will take 20 mEq of potassium with each Lasix full dose 40 mg in 10 meq with half dose or 20 mg

## 2020-06-27 NOTE — Assessment & Plan Note (Signed)
#  Iron deficiency anemia-on July 6 we had referred patient to GI based on continued anemia and review of notes from Dr. Christella Hartigan stating follow-up in 5 years from last colonoscopy in 2011.  She did have AVMs and is on Eliquis which could increase risk of anemia/iron deficiency.  She does take iron. -After discussion today patient declines GI follow-up unless she has significant worsening of anemia.  She understands potential that we could be missing something like colon cancer but at her age and comorbidities she prefers to avoid GI consult at this time

## 2020-06-27 NOTE — Assessment & Plan Note (Addendum)
S: Compliant with irbesartan 300 mg (ok per cards as only unilateral) and lasix 40mg  twice daily-increased last visit.  Off amlodipine due to edema issues -off hctz since starting lasix -off amlodiine march 2021 with edema issues .  Blood pressure high in past with caffeine intake  BP Readings from Last 3 Encounters:  06/27/20 (!) 136/80  06/16/20 130/64  06/06/20 (!) 102/58   A/P: Reasonable control on repeat today-continue irbesartan 300 mg and Lasix 40 mg-40 mg in the a.m. and 20 mg 6 hours later

## 2020-06-28 DIAGNOSIS — K9189 Other postprocedural complications and disorders of digestive system: Secondary | ICD-10-CM | POA: Diagnosis not present

## 2020-06-28 DIAGNOSIS — I110 Hypertensive heart disease with heart failure: Secondary | ICD-10-CM | POA: Diagnosis not present

## 2020-06-28 DIAGNOSIS — I5033 Acute on chronic diastolic (congestive) heart failure: Secondary | ICD-10-CM | POA: Diagnosis not present

## 2020-06-28 DIAGNOSIS — K651 Peritoneal abscess: Secondary | ICD-10-CM | POA: Diagnosis not present

## 2020-06-28 DIAGNOSIS — J90 Pleural effusion, not elsewhere classified: Secondary | ICD-10-CM | POA: Diagnosis not present

## 2020-06-28 DIAGNOSIS — Z4803 Encounter for change or removal of drains: Secondary | ICD-10-CM | POA: Diagnosis not present

## 2020-06-29 ENCOUNTER — Encounter (INDEPENDENT_AMBULATORY_CARE_PROVIDER_SITE_OTHER): Payer: Medicare Other | Admitting: Ophthalmology

## 2020-06-29 ENCOUNTER — Other Ambulatory Visit: Payer: Self-pay

## 2020-06-29 DIAGNOSIS — I10 Essential (primary) hypertension: Secondary | ICD-10-CM | POA: Diagnosis not present

## 2020-06-29 DIAGNOSIS — H43813 Vitreous degeneration, bilateral: Secondary | ICD-10-CM | POA: Diagnosis not present

## 2020-06-29 DIAGNOSIS — Z4803 Encounter for change or removal of drains: Secondary | ICD-10-CM | POA: Diagnosis not present

## 2020-06-29 DIAGNOSIS — I5033 Acute on chronic diastolic (congestive) heart failure: Secondary | ICD-10-CM | POA: Diagnosis not present

## 2020-06-29 DIAGNOSIS — H353112 Nonexudative age-related macular degeneration, right eye, intermediate dry stage: Secondary | ICD-10-CM

## 2020-06-29 DIAGNOSIS — K9189 Other postprocedural complications and disorders of digestive system: Secondary | ICD-10-CM | POA: Diagnosis not present

## 2020-06-29 DIAGNOSIS — H353221 Exudative age-related macular degeneration, left eye, with active choroidal neovascularization: Secondary | ICD-10-CM

## 2020-06-29 DIAGNOSIS — H35033 Hypertensive retinopathy, bilateral: Secondary | ICD-10-CM | POA: Diagnosis not present

## 2020-06-29 DIAGNOSIS — I110 Hypertensive heart disease with heart failure: Secondary | ICD-10-CM | POA: Diagnosis not present

## 2020-06-29 DIAGNOSIS — J90 Pleural effusion, not elsewhere classified: Secondary | ICD-10-CM | POA: Diagnosis not present

## 2020-06-29 DIAGNOSIS — K651 Peritoneal abscess: Secondary | ICD-10-CM | POA: Diagnosis not present

## 2020-06-30 DIAGNOSIS — Z4803 Encounter for change or removal of drains: Secondary | ICD-10-CM | POA: Diagnosis not present

## 2020-06-30 DIAGNOSIS — K9189 Other postprocedural complications and disorders of digestive system: Secondary | ICD-10-CM | POA: Diagnosis not present

## 2020-06-30 DIAGNOSIS — K651 Peritoneal abscess: Secondary | ICD-10-CM | POA: Diagnosis not present

## 2020-06-30 DIAGNOSIS — I110 Hypertensive heart disease with heart failure: Secondary | ICD-10-CM | POA: Diagnosis not present

## 2020-06-30 DIAGNOSIS — I5033 Acute on chronic diastolic (congestive) heart failure: Secondary | ICD-10-CM | POA: Diagnosis not present

## 2020-06-30 DIAGNOSIS — J90 Pleural effusion, not elsewhere classified: Secondary | ICD-10-CM | POA: Diagnosis not present

## 2020-07-06 ENCOUNTER — Other Ambulatory Visit: Payer: Medicare Other

## 2020-07-06 DIAGNOSIS — Z515 Encounter for palliative care: Secondary | ICD-10-CM

## 2020-07-07 DIAGNOSIS — K651 Peritoneal abscess: Secondary | ICD-10-CM | POA: Diagnosis not present

## 2020-07-07 DIAGNOSIS — J90 Pleural effusion, not elsewhere classified: Secondary | ICD-10-CM | POA: Diagnosis not present

## 2020-07-07 DIAGNOSIS — I110 Hypertensive heart disease with heart failure: Secondary | ICD-10-CM | POA: Diagnosis not present

## 2020-07-07 DIAGNOSIS — Z4803 Encounter for change or removal of drains: Secondary | ICD-10-CM | POA: Diagnosis not present

## 2020-07-07 DIAGNOSIS — K9189 Other postprocedural complications and disorders of digestive system: Secondary | ICD-10-CM | POA: Diagnosis not present

## 2020-07-07 DIAGNOSIS — I5033 Acute on chronic diastolic (congestive) heart failure: Secondary | ICD-10-CM | POA: Diagnosis not present

## 2020-07-10 NOTE — Progress Notes (Signed)
Phone [phone] In person visit   Subjective:   Cheryl Atkinson is a 86 y.o. year old very pleasant female patient who presents for/with See problem oriented charting Chief Complaint  Patient presents with  . Follow-up   This visit occurred during the SARS-CoV-2 public health emergency.  Safety protocols were in place, including screening questions prior to the visit, additional usage of staff PPE, and extensive cleaning of exam room while observing appropriate contact time as indicated for disinfecting solutions.   Past Medical History-  Patient Active Problem List   Diagnosis Date Noted  . Pleural effusion due to CHF (congestive heart failure) (HCC) 05/26/2020    Priority: High  . Heart failure with preserved ejection fraction (HCC) 03/01/2020    Priority: High  . Bacteremia 01/28/2020    Priority: High  . Atrial fibrillation (HCC) 01/25/2020    Priority: High  . Memory loss 04/11/2016    Priority: High  . Renal artery stenosis (HCC) 09/27/2015    Priority: High  . Claudication (HCC) 05/04/2015    Priority: High  . Atherosclerotic PVD with intermittent claudication (HCC) 04/26/2015    Priority: High  . DNR (do not resuscitate) 09/29/2014    Priority: High  . Hyperglycemia 07/19/2014    Priority: High  . LOW BACK PAIN 09/25/2007    Priority: High  . Insomnia 03/01/2020    Priority: Medium  . Major depression 03/25/2018    Priority: Medium  . BPPV (benign paroxysmal positional vertigo) 07/12/2016    Priority: Medium  . Hyperlipidemia 06/30/2015    Priority: Medium  . Former smoker 09/29/2014    Priority: Medium  . COPD (chronic obstructive pulmonary disease) (HCC) 09/20/2009    Priority: Medium  . Iron deficiency anemia 11/09/2007    Priority: Medium  . RESTLESS LEG SYNDROME 09/25/2007    Priority: Medium  . Essential hypertension 09/25/2007    Priority: Medium  . GERD 09/25/2007    Priority: Medium  . Osteoporosis 09/25/2007    Priority: Medium  . S/P  laparoscopic cholecystectomy 04/04/2020    Priority: Low  . Abdominal aortic ectasia (HCC) 12/29/2019    Priority: Low  . Cat bite of right hand 12/21/2016    Priority: Low  . Mallet toe of right foot 10/20/2015    Priority: Low  . Tinnitus 12/31/2014    Priority: Low  . Multinodular goiter 08/30/2013    Priority: Low  . Spinal stenosis of lumbar region at multiple levels 09/29/2012    Priority: Low  . HIP PAIN, BILATERAL 07/16/2010    Priority: Low  . CONSTIPATION, CHRONIC 09/20/2009    Priority: Low  . History of UTI 11/09/2007    Priority: Low  . Protein-calorie malnutrition, severe 05/31/2020  . Adrenal nodule (HCC) 12/29/2019    Medications- reviewed and updated Current Outpatient Medications  Medication Sig Dispense Refill  . Albuterol Sulfate (PROAIR RESPICLICK) 108 (90 Base) MCG/ACT AEPB Inhale 2 puffs into the lungs every 6 (six) hours as needed (shortness of breath from COPD). 1 each 5  . apixaban (ELIQUIS) 5 MG TABS tablet Take 1 tablet (5 mg total) by mouth 2 (two) times daily. Start 04/06/20 60 tablet 11  . atorvastatin (LIPITOR) 40 MG tablet Take 1 tablet (40 mg total) by mouth daily. 90 tablet 3  . bisacodyl (DULCOLAX) 10 MG suppository Place 1 suppository (10 mg total) rectally daily as needed for moderate constipation. 12 suppository 0  . furosemide (LASIX) 40 MG tablet Take 1 tablet (40 mg total) by  mouth 2 (two) times daily. 60 tablet 5  . irbesartan (AVAPRO) 300 MG tablet Take 1 tablet (300 mg total) by mouth daily. 90 tablet 3  . Multiple Vitamins-Minerals (PRESERVISION AREDS 2 PO) Take 1 tablet by mouth 2 (two) times daily.    Marland Kitchen NEUPRO 3 MG/24HR PT24 PLACE 1 PATCH (3 MG) ONTO THE SKIN AT BEDTIME (Patient taking differently: Apply 1 patch topically at bedtime. Take off in the morning) 90 patch 3  . polyethylene glycol (MIRALAX / GLYCOLAX) 17 g packet Take 17 g by mouth 2 (two) times daily. Reported on 03/14/2016 (Patient taking differently: Take 17 g by mouth  daily as needed for mild constipation. )    . potassium chloride SA (KLOR-CON) 20 MEQ tablet Take 1 tablet (20 mEq total) by mouth 2 (two) times daily as needed. Needs to be taken  With each dose of lasix 60 tablet 5  . traZODone (DESYREL) 50 MG tablet Take 1.5 tablets (75 mg total) by mouth at bedtime as needed for sleep. 20 tablet 0   No current facility-administered medications for this visit.     Objective:  BP 136/72   Pulse 96   Temp 98.6 F (37 C) (Temporal)   Ht 5\' 6"  (1.676 m)   Wt 136 lb (61.7 kg)   LMP  (LMP Unknown)   SpO2 96%   BMI 21.95 kg/m  Gen: NAD, resting comfortably CV: RRR no murmurs rubs or gallops Lungs: CTAB no crackles, wheeze, rhonchi Ext: no edema Skin: warm, dry In wheelchair today but can walk    Assessment and Plan    #Hypertension/heart failure with preserved ejection fraction S: Compliant with irbesartan 300 mg (ok per cards as only unilateral) and lasix 40mg  in the am and 2:00 pm take 20mg -down from 40 mg twice daily last visit where edema had significantly improved. Home readings have been good.  She also takes potassium 20 mEq with full Lasix -nighttime urination better on lower dose of lasix  Wt stable. SOB better. Walking more at home- can even walk to her car now Wt Readings from Last 3 Encounters:  07/13/20 136 lb (61.7 kg)  06/27/20 136 lb 3.2 oz (61.8 kg)  06/16/20 135 lb 4 oz (61.3 kg)  A/P: HTN controlled on repeat- continue current meds  For CHF- continue lasix 40 in AM and 20 mg 6 hours later. Continue to watch salt at home and watch weight- if up 3 lbs in a day or 5 lbs in 3 days she will let me know     Recommended follow up:   Future Appointments  Date Time Provider Department Center  07/14/2020  9:30 AM [facility]  07/25/2020 10:45 AM 06/18/20, MD [facility]  12/27/2020 12:45 PM Runell Gess, MD [facility] None    Lab/Order associations:   ICD-10-CM   1. Essential hypertension   I10   2. Chronic heart failure with preserved ejection fraction (HCC)  I50.32    Return precautions advised.  [facility], MD

## 2020-07-10 NOTE — Patient Instructions (Addendum)
Health Maintenance Due  Topic Date Due  . OPHTHALMOLOGY EXAM - Sign release of information at the check out desk for this- or team you can help her sign Never done  . INFLUENZA VACCINE -hopefully will have flu shot by her next visit.  If you happen to get out of pharmacy before next visit-please let us know the date 07/02/2020   continue lasix 40 in AM and 20 mg 6 hours later. Continue to watch salt at home and watch weight- if up 3 lbs in a day or 5 lbs in 3 days she will let me know     Labs next visit

## 2020-07-11 ENCOUNTER — Other Ambulatory Visit: Payer: Self-pay

## 2020-07-11 DIAGNOSIS — M6281 Muscle weakness (generalized): Secondary | ICD-10-CM | POA: Diagnosis not present

## 2020-07-11 DIAGNOSIS — R2681 Unsteadiness on feet: Secondary | ICD-10-CM | POA: Diagnosis not present

## 2020-07-11 DIAGNOSIS — I480 Paroxysmal atrial fibrillation: Secondary | ICD-10-CM | POA: Diagnosis not present

## 2020-07-11 DIAGNOSIS — Z7901 Long term (current) use of anticoagulants: Secondary | ICD-10-CM | POA: Diagnosis not present

## 2020-07-11 DIAGNOSIS — J449 Chronic obstructive pulmonary disease, unspecified: Secondary | ICD-10-CM | POA: Diagnosis not present

## 2020-07-11 DIAGNOSIS — I5032 Chronic diastolic (congestive) heart failure: Secondary | ICD-10-CM | POA: Diagnosis not present

## 2020-07-11 DIAGNOSIS — G2581 Restless legs syndrome: Secondary | ICD-10-CM | POA: Diagnosis not present

## 2020-07-11 DIAGNOSIS — I110 Hypertensive heart disease with heart failure: Secondary | ICD-10-CM | POA: Diagnosis not present

## 2020-07-11 DIAGNOSIS — F0390 Unspecified dementia without behavioral disturbance: Secondary | ICD-10-CM | POA: Diagnosis not present

## 2020-07-11 DIAGNOSIS — G4700 Insomnia, unspecified: Secondary | ICD-10-CM | POA: Diagnosis not present

## 2020-07-11 DIAGNOSIS — Z87891 Personal history of nicotine dependence: Secondary | ICD-10-CM | POA: Diagnosis not present

## 2020-07-11 NOTE — Progress Notes (Signed)
PATIENT NAME: Cheryl Atkinson DOB: [birth year] MRN: [id number]  PRIMARY CARE PROVIDER: Shelva Majestic, MD  RESPONSIBLE PARTY:  Acct ID - Guarantor Home Phone Work Phone Relationship Acct Type  [id number] JANKI, DIKE* [phone]  Self P/F     [address], [place], [place] [address]    PLAN OF CARE and INTERVENTIONS:               1.  GOALS OF CARE/ ADVANCE CARE PLANNING: Patient denies any changes in Goals of care.                2.  PATIENT/CAREGIVER EDUCATION: Reinforced education on fall precautions, importance of weighing every day and monitor for s/s of CHF                          3.  DISEASE STATUS: RN telephone visit. Spoke with patient who was able to answer questions. Patient's past medical history includes but not limited to hypertension, AFib, PVD with intermittent claudication, renal artery stenosis, abdominal aortic ectasia, heart failure with preserved ejection fraction, COPD, pleural effusion due to CHF, GERD, multinodular goiter, osteoporosis, history of UTIs, iron deficiency anemia, restless leg syndrome, low back pain, bilateral hip pain, chronic constipation, hyperglycemia and spine spinal stenosis of lumbar region at multiple levels. Patient denies any pain at this time. Reports appetite remains good. Per chart review and discus40sion with patient, noted that patient's weight has increased. PCP has made adjustments to diuretic with improvement in edema, despite no weight changes. Patient reports to this RN that she does feel better and offers no concerns at this time.    HISTORY OF PRESENT ILLNESS:  This is an 86 year old female residing in her home. Palliative care will continue to follow monthly and prn.   CODE STATUS: DNR ADVANCED DIRECTIVES: Y MOST FORM: Y PPS: 40%   PHYSICAL EXAM:  Deferred due to visit completed via telephone.         Estanislado Pandy, RN

## 2020-07-13 ENCOUNTER — Ambulatory Visit (INDEPENDENT_AMBULATORY_CARE_PROVIDER_SITE_OTHER): Payer: Medicare Other | Admitting: Family Medicine

## 2020-07-13 ENCOUNTER — Encounter: Payer: Self-pay | Admitting: Family Medicine

## 2020-07-13 ENCOUNTER — Other Ambulatory Visit: Payer: Self-pay

## 2020-07-13 VITALS — BP 136/72 | HR 96 | Temp 98.6°F | Ht 66.0 in | Wt 136.0 lb

## 2020-07-13 DIAGNOSIS — I70219 Atherosclerosis of native arteries of extremities with intermittent claudication, unspecified extremity: Secondary | ICD-10-CM | POA: Diagnosis not present

## 2020-07-13 DIAGNOSIS — I10 Essential (primary) hypertension: Secondary | ICD-10-CM

## 2020-07-13 DIAGNOSIS — I5032 Chronic diastolic (congestive) heart failure: Secondary | ICD-10-CM | POA: Diagnosis not present

## 2020-07-14 ENCOUNTER — Ambulatory Visit (INDEPENDENT_AMBULATORY_CARE_PROVIDER_SITE_OTHER): Payer: Medicare Other

## 2020-07-14 DIAGNOSIS — Z0000 Encounter for general adult medical examination without abnormal findings: Secondary | ICD-10-CM

## 2020-07-14 NOTE — Progress Notes (Signed)
Virtual Visit via Telephone Note  I connected with  Crissie ReeseBetty L Gayman on 07/14/20 at  9:30 AM EDT by telephone and verified that I am speaking with the correct person using two identifiers.  Medicare Annual Wellness visit completed telephonically due to Covid-19 pandemic.   Persons participating in this call: This Health Coach and this patient.   Location: Patient: Home Provider: Office   I discussed the limitations, risks, security and privacy concerns of performing an evaluation and management service by telephone and the availability of in person appointments. The patient expressed understanding and agreed to proceed.  Unable to perform video visit due to video visit attempted and failed and/or patient does not have video capability.   Some vital signs may be absent or patient reported.   Marzella Schleinina H Akeel Reffner, LPN    Subjective:   Crissie ReeseBetty L Archey is a 86 y.o. female who presents for Medicare Annual (Subsequent) preventive examination.  Review of Systems     Cardiac Risk Factors include: advanced age (>1455men, 77>65 women);dyslipidemia;hypertension     Objective:    There were no vitals filed for this visit. There is no height or weight on file to calculate BMI.  Advanced Directives 07/14/2020 05/24/2020 05/23/2020 05/23/2020 04/07/2020 04/06/2020 04/04/2020  Does Patient Have a Medical Advance Directive? Yes Yes Yes Yes Yes Unable to assess, patient is non-responsive or altered mental status Yes  Type of Estate agentAdvance Directive Healthcare Power of eBayttorney Healthcare Power of [place]Attorney;Living will Healthcare Power of [place]orney;Living will Healthcare Power of [place] CenterAttorney;Living will Healthcare Power of Attorney - Healthcare Power of Attorney  Does patient want to make changes to medical advance directive? - No - Patient declined No - Patient declined - No - Patient declined - No - Patient declined  Copy of Healthcare Power of Attorney in Chart? Yes - validated most recent copy scanned in chart (See row  information) No - copy requested No - copy requested No - copy requested No - copy requested - No - copy requested  Would patient like information on creating a medical advance directive? - - - - - - -  Pre-existing out of facility DNR order (yellow form or pink MOST form) - - - - - - -    Current Medications (verified) Outpatient Encounter Medications as of 07/14/2020  Medication Sig  . Albuterol Sulfate (PROAIR RESPICLICK) 108 (90 Base) MCG/ACT AEPB Inhale 2 puffs into the lungs every 6 (six) hours as needed (shortness of breath from COPD).  Marland Kitchen. apixaban (ELIQUIS) 5 MG TABS tablet Take 1 tablet (5 mg total) by mouth 2 (two) times daily. Start 04/06/20  . atorvastatin (LIPITOR) 40 MG tablet Take 1 tablet (40 mg total) by mouth daily.  . furosemide (LASIX) 40 MG tablet Take 1 tablet (40 mg total) by mouth 2 (two) times daily.  . irbesartan (AVAPRO) 300 MG tablet Take 1 tablet (300 mg total) by mouth daily.  . Multiple Vitamins-Minerals (PRESERVISION AREDS 2 PO) Take 1 tablet by mouth 2 (two) times daily.  Marland Kitchen. NEUPRO 3 MG/24HR PT24 PLACE 1 PATCH (3 MG) ONTO THE SKIN AT BEDTIME (Patient taking differently: Apply 1 patch topically at bedtime. Take off in the morning)  . polyethylene glycol (MIRALAX / GLYCOLAX) 17 g packet Take 17 g by mouth 2 (two) times daily. Reported on 03/14/2016 (Patient taking differently: Take 17 g by mouth daily as needed for mild constipation. )  . potassium chloride SA (KLOR-CON) 20 MEQ tablet Take 1 tablet (20 mEq total) by mouth 2 (  two) times daily as needed. Needs to be taken  With each dose of lasix  . traZODone (DESYREL) 50 MG tablet Take 1.5 tablets (75 mg total) by mouth at bedtime as needed for sleep.  . bisacodyl (DULCOLAX) 10 MG suppository Place 1 suppository (10 mg total) rectally daily as needed for moderate constipation. (Patient not taking: Reported on 07/14/2020)   No facility-administered encounter medications on file as of 07/14/2020.    Allergies  (verified) Codeine   History: Past Medical History:  Diagnosis Date  . Acute cholecystitis 12/29/2019  . Anemia   . Arthritis    "shoulders" (05/04/2015)  . Cellulitis of right lower extremity 11/27/2013  . CHF (congestive heart failure) (HCC)   . Chronic lower back pain   . Constipation   . COPD (chronic obstructive pulmonary disease) (HCC)   . GERD (gastroesophageal reflux disease)   . History of hiatal hernia   . HLD (hyperlipidemia)   . Hypertension   . Insomnia   . Osteoporosis   . Peripheral arterial disease (HCC)   . Restless leg syndrome   . Thyroid disease    Past Surgical History:  Procedure Laterality Date  . ABDOMINAL AORTAGRAM  05/04/2015   Procedure: Abdominal Aortagram;  Surgeon: Runell Gess, MD;  Location: [facility] INVASIVE CV LAB;  Service: Cardiovascular;;  . APPENDECTOMY    . CATARACT EXTRACTION W/ INTRAOCULAR LENS  IMPLANT, BILATERAL Bilateral   . CHOLECYSTECTOMY N/A 04/04/2020   Procedure: LAPAROSCOPIC CHOLECYSTECTOMY;  Surgeon: Violeta Gelinas, MD;  Location: [facility] OR;  Service: General;  Laterality: N/A;  . I & D EXTREMITY Right 12/22/2016   Procedure: IRRIGATION AND DEBRIDEMENT EXTREMITY;  Surgeon: Mack Hook, MD;  Location: [facility] OR;  Service: Orthopedics;  Laterality: Right;  . IR EXCHANGE BILIARY DRAIN  03/13/2020  . IR PATIENT EVAL TECH 0-60 MINS  12/30/2019  . IR PERC CHOLECYSTOSTOMY  01/27/2020  . IR RADIOLOGIST EVAL & MGMT  05/16/2020  . PERIPHERAL VASCULAR CATHETERIZATION N/A 05/04/2015   Procedure: Lower Extremity Angiography;  Surgeon: Runell Gess, MD;  Location: [facility] INVASIVE CV LAB;  Service: Cardiovascular;  Laterality: N/A;  . PERIPHERAL VASCULAR CATHETERIZATION  05/04/2015   Procedure: Peripheral Vascular Intervention;  Surgeon: Runell Gess, MD;  Location: [facility] INVASIVE CV LAB;  Service: Cardiovascular;;  RCIA - 7x22 ICAST  . PERIPHERAL VASCULAR CATHETERIZATION Right 09/04/2015   Procedure: Peripheral Vascular Atherectomy;  Surgeon: Runell Gess, MD;  Location: [facility] INVASIVE CV LAB;  Service: Cardiovascular;  Laterality: Right;  SFA  . sfa Right 09/04/2015   de balloon  . THYROID SURGERY Right ?2013   "had goiter taken off my neck"  . TONSILLECTOMY    . VAGINAL HYSTERECTOMY     Family History  Problem Relation Age of Onset  . Heart disease Father   . Heart attack Father   . Heart disease Mother   . Coronary artery disease Other   . Atrial fibrillation Sister   . Stroke Maternal Grandmother   . Cancer Paternal Grandmother    Social History   Socioeconomic History  . Marital status: Widowed    Spouse name: Not on file  . Number of children: Not on file  . Years of education: Not on file  . Highest education level: Not on file  Occupational History  . Occupation: retired  Tobacco Use  . Smoking status: Former Smoker    Packs/day: 0.50    Years: 60.00    Pack years: 30.00    Types: Cigarettes  Quit date: 04/02/2015    Years since quitting: 5.2  . Smokeless tobacco: Never Used  Vaping Use  . Vaping Use: Never used  Substance and Sexual Activity  . Alcohol use: No  . Drug use: No  . Sexual activity: Not Currently  Other Topics Concern  . Not on file  Social History Narrative   Widowed 2013. 2 sons. 1 grandchild.    Son can help on weekends. Sister and sister in law could help as well. Thinks she may stop driving in next few years.       Retired from [employer].       Hobbies: time at home and with family      HCPOA: sister-in-law and brother. Deirdre Evener.       DNR/DNI      Regular exercise: none   Caffeine use: cup of coffee    Social Determinants of Health   Financial Resource Strain: Low Risk   . Difficulty of Paying Living Expenses: Not hard at all  Food Insecurity: No Food Insecurity  . Worried About Programme researcher, broadcasting/film/video in the Last Year: Never true  . Ran Out of Food in the Last Year: Never true  Transportation Needs: No Transportation Needs  . Lack of Transportation (Medical): No   . Lack of Transportation (Non-Medical): No  Physical Activity: Unknown  . Days of Exercise per Week: 1 day  . Minutes of Exercise per Session: Not on file  Stress: No Stress Concern Present  . Feeling of Stress : Not at all  Social Connections: Moderately Isolated  . Frequency of Communication with Friends and Family: More than three times a week  . Frequency of Social Gatherings with Friends and Family: Twice a week  . Attends Religious Services: More than 4 times per year  . Active Member of Clubs or Organizations: No  . Attends Banker Meetings: Never  . Marital Status: Widowed    Tobacco Counseling Counseling given: Not Answered   Clinical Intake:  Pre-visit preparation completed: Yes  Pain : No/denies pain     BMI - recorded: 21.99 Nutritional Status: BMI of 19-24  Normal Diabetes: No  How often do you need to have someone help you when you read instructions, pamphlets, or other written materials from your doctor or pharmacy?: 1 - Never  Diabetic?No  Interpreter Needed?: No  Information entered by :: Lanier Ensign, LPN   Activities of Daily Living In your present state of health, do you have any difficulty performing the following activities: 07/14/2020 05/24/2020  Hearing? N N  Vision? Y N  Comment macular degeneration per pt -  Difficulty concentrating or making decisions? N N  Walking or climbing stairs? Y Y  Comment can with assistance -  Dressing or bathing? Malvin Johns  Comment assistance -  Doing errands, shopping? N Y  Quarry manager and eating ? Y -  Comment meals prepared by sister and sister in law -  Using the Toilet? N -  In the past six months, have you accidently leaked urine? Y -  Comment urgency and wears briefs and poise pad -  Do you have problems with loss of bowel control? N -  Managing your Medications? N -  Managing your Finances? N -  Housekeeping or managing your Housekeeping? N -  Some recent data might be hidden     Patient Care Team: Shelva Majestic, MD as PCP - General (Family Medicine) Runell Gess, MD as PCP -  Cardiology (Cardiology) Runell Gess, MD as Consulting Physician (Cardiology) Letta Kocher, MD as Consulting Physician (Rehabilitation) Sherrie George, MD as Consulting Physician (Ophthalmology) Violeta Gelinas, MD as Consulting Physician (General Surgery)  Indicate any recent Medical Services you may have received from other than [facility] providers in the past year (date may be approximate).     Assessment:   This is a routine wellness examination for Kayleen.  Hearing/Vision screen  Hearing Screening             Right ear:           Left ear:           Comments: Pt states no hearing difficulty  Vision Screening Comments: Dr Ashley Royalty follows up with eye exams  Dietary issues and exercise activities discussed: Current Exercise Habits: The patient does not participate in regular exercise at present  Goals    . Increase physical activity     Patient would like to be able to walk more. We discussed her accessing the [employer]neakers program at her local [employer]. She has an interest in pursuing this    . Increase water intake     Start drinking at least a full glass of water with medication and at meals.    . Patient Stated     Learn to walk without a walker      Depression Screen PHQ 2/9 Scores 07/14/2020 06/27/2020 06/06/2020 02/10/2020 10/05/2019 07/14/2019 03/31/2019  PHQ - 2 Score 0 0 0 0 0 0 0  PHQ- 9 Score - 0 0 4 5 - 2    Fall Risk Fall Risk  07/14/2020 06/16/2020 10/05/2019 07/14/2019 03/31/2019  Falls in the past year? 0 0 1 0 0  Comment - - - - -  Number falls in past yr: 0 - 1 0 0  Comment - - - - -  Injury with Fall? 0 - 0 0 0  Comment - - - - -  Risk for fall due to : Impaired balance/gait;Impaired mobility;Impaired vision - - Impaired mobility -  Risk for fall due to: Comment uses a walker - - - -  Follow  up Falls prevention discussed - - Education provided -    Any stairs in or around the home? Yes  If so, are there any without handrails? Yes  Home free of loose throw rugs in walkways, pet beds, electrical cords, etc? Yes  Adequate lighting in your home to reduce risk of falls? Yes   ASSISTIVE DEVICES UTILIZED TO PREVENT FALLS:  Life alert? Yes  Use of a cane, walker or w/c? Yes  Grab bars in the bathroom? Yes  Shower chair or bench in shower? Yes  Elevated toilet seat or a handicapped toilet? Yes   TIMED UP AND GO:  Was the test performed? No .   Cognitive Function: MMSE - Mini Mental State Exam 05/15/2017  Orientation to time 5  Orientation to Place 5  Registration 3  Attention/ Calculation 5  Recall 1  Language- name 2 objects 2  Language- repeat 1  Language- follow 3 step command 3  Language- read & follow direction 1  Write a sentence 1  Copy design 0  Total score 27     6CIT Screen 07/14/2020 07/14/2019 07/07/2018  What Year? 0 points 0 points 0 points  What month? 0 points 0 points 0 points  What time? - 0 points 0 points  Count back from 20 0 points 0 points 0 points  Months  in reverse 4 points 0 points 4 points  Repeat phrase 6 points - -    Immunizations Immunization History  Administered Date(s) Administered  . Influenza Split 08/20/2011, 08/28/2012, 09/29/2012  . Influenza Whole 09/25/2007, 09/20/2009  . Influenza, High Dose Seasonal PF 09/03/2014, 08/19/2017, 08/18/2018, 09/16/2019  . Influenza,inj,Quad PF,6+ Mos 08/30/2013, 09/05/2015  . Influenza-Unspecified 08/22/2016  . PFIZER SARS-COV-2 Vaccination 03/04/2020, 03/29/2020  . Pneumococcal Conjugate-13 09/29/2014  . Pneumococcal Polysaccharide-23 12/02/2005  . Td 11/01/1993  . Tdap 11/27/2013  . Zoster 02/17/2007    TDAP status: Up to date Flu Vaccine status: Up to date Pneumococcal vaccine status: Up to date Covid-19 vaccine status: Completed vaccines  Qualifies for Shingles Vaccine? Yes    Zostavax completed Yes   Shingrix Completed?: No.    Education has been provided regarding the importance of this vaccine. Patient has been advised to call insurance company to determine out of pocket expense if they have not yet received this vaccine. Advised may also receive vaccine at local pharmacy or Health Dept. Verbalized acceptance and understanding.  Screening Tests Health Maintenance  Topic Date Due  . OPHTHALMOLOGY EXAM  Never done  . INFLUENZA VACCINE  07/02/2020  . HEMOGLOBIN A1C  11/28/2020  . FOOT EXAM  06/06/2021  . TETANUS/TDAP  11/28/2023  . DEXA SCAN  Completed  . COVID-19 Vaccine  Completed  . PNA vac Low Risk Adult  Completed    Health Maintenance  Health Maintenance Due  Topic Date Due  . OPHTHALMOLOGY EXAM  Never done  . INFLUENZA VACCINE  07/02/2020    Colorectal cancer screening: No longer required.  Mammogram status: No longer required.  Bone Density status: Completed 03/18/16. Results reflect: Bone density results: OSTEOPOROSIS. Repeat every 2 years.  Additional Screening:    Vision Screening: Recommended annual ophthalmology exams for early detection of glaucoma and other disorders of the eye. Is the patient up to date with their annual eye exam?  Yes  Who is the provider or what is the name of the office in which the patient attends annual eye exams? Dr Ashley Royalty If pt is not established with a provider, would they like to be referred to a provider to establish care?   Dental Screening: Recommended annual dental exams for proper oral hygiene  Community Resource Referral / Chronic Care Management: CRR required this visit?  No   CCM required this visit?  No      Plan:     I have personally reviewed and noted the following in the patient's chart:   . Medical and social history . Use of alcohol, tobacco or illicit drugs  . Current medications and supplements . Functional ability and status . Nutritional status . Physical  activity . Advanced directives . List of other physicians . Hospitalizations, surgeries, and ER visits in previous 12 months . Vitals . Screenings to include cognitive, depression, and falls . Referrals and appointments  In addition, I have reviewed and discussed with patient certain preventive protocols, quality metrics, and best practice recommendations. A written personalized care plan for preventive services as well as general preventive health recommendations were provided to patient.     Marzella Schlein, LPN   [id number]/[id number]   Nurse Notes: None

## 2020-07-14 NOTE — Patient Instructions (Signed)
Cheryl Atkinson , Thank you for taking time to come for your Medicare Wellness Visit. I appreciate your ongoing commitment to your health goals. Please review the following plan we discussed and let me know if I can assist you in the future.   Screening recommendations/referrals: Colonoscopy: Done 02/28/10 Mammogram: No longer required  Bone Density:Done 03/18/16 Recommended yearly ophthalmology/optometry visit for glaucoma screening and checkup Recommended yearly dental visit for hygiene and checkup  Vaccinations: Influenza vaccine: Up to date Pneumococcal vaccine: Up to date Tdap vaccine: Up to date Shingles vaccine: Shingrix discussed. Please contact your pharmacy for coverage information.    Covid-19:Completed 4/3 & 03/29/20  Advanced directives: Copy in chart  Conditions/risks identified: learn to walk again without walker  Next appointment: Follow up in one year for your annual wellness visit    Preventive Care 65 Years and Older, Female Preventive care refers to lifestyle choices and visits with your health care provider that can promote health and wellness. What does preventive care include?  A yearly physical exam. This is also called an annual well check.  Dental exams once or twice a year.  Routine eye exams. Ask your health care provider how often you should have your eyes checked.  Personal lifestyle choices, including:  Daily care of your teeth and gums.  Regular physical activity.  Eating a healthy diet.  Avoiding tobacco and drug use.  Limiting alcohol use.  Practicing safe sex.  Taking low-dose aspirin every day.  Taking vitamin and mineral supplements as recommended by your health care provider. What happens during an annual well check? The services and screenings done by your health care provider during your annual well check will depend on your age, overall health, lifestyle risk factors, and family history of disease. Counseling  Your health care  provider may ask you questions about your:  Alcohol use.  Tobacco use.  Drug use.  Emotional well-being.  Home and relationship well-being.  Sexual activity.  Eating habits.  History of falls.  Memory and ability to understand (cognition).  Work and work Astronomer.  Reproductive health. Screening  You may have the following tests or measurements:  Height, weight, and BMI.  Blood pressure.  Lipid and cholesterol levels. These may be checked every 5 years, or more frequently if you are over 30 years old.  Skin check.  Lung cancer screening. You may have this screening every year starting at age 84 if you have a 30-pack-year history of smoking and currently smoke or have quit within the past 15 years.  Fecal occult blood test (FOBT) of the stool. You may have this test every year starting at age 84.  Flexible sigmoidoscopy or colonoscopy. You may have a sigmoidoscopy every 5 years or a colonoscopy every 10 years starting at age 84.  Hepatitis C blood test.  Hepatitis B blood test.  Sexually transmitted disease (STD) testing.  Diabetes screening. This is done by checking your blood sugar (glucose) after you have not eaten for a while (fasting). You may have this done every 1-3 years.  Bone density scan. This is done to screen for osteoporosis. You may have this done starting at age 84.  Mammogram. This may be done every 1-2 years. Talk to your health care provider about how often you should have regular mammograms. Talk with your health care provider about your test results, treatment options, and if necessary, the need for more tests. Vaccines  Your health care provider may recommend certain vaccines, such as:  Influenza vaccine.  This is recommended every year.  Tetanus, diphtheria, and acellular pertussis (Tdap, Td) vaccine. You may need a Td booster every 10 years.  Zoster vaccine. You may need this after age 84.  Pneumococcal 13-valent conjugate (PCV13)  vaccine. One dose is recommended after age 84.  Pneumococcal polysaccharide (PPSV23) vaccine. One dose is recommended after age 84. Talk to your health care provider about which screenings and vaccines you need and how often you need them. This information is not intended to replace advice given to you by your health care provider. Make sure you discuss any questions you have with your health care provider. Document Released: 12/15/2015 Document Revised: 08/07/2016 Document Reviewed: 09/19/2015 Elsevier Interactive Patient Education  2017 [place] Prevention in the Home Falls can cause injuries. They can happen to people of all ages. There are many things you can do to make your home safe and to help prevent falls. What can I do on the outside of my home?  Regularly fix the edges of walkways and driveways and fix any cracks.  Remove anything that might make you trip as you walk through a door, such as a raised step or threshold.  Trim any bushes or trees on the path to your home.  Use bright outdoor lighting.  Clear any walking paths of anything that might make someone trip, such as rocks or tools.  Regularly check to see if handrails are loose or broken. Make sure that both sides of any steps have handrails.  Any raised decks and porches should have guardrails on the edges.  Have any leaves, snow, or ice cleared regularly.  Use sand or salt on walking paths during winter.  Clean up any spills in your garage right away. This includes oil or grease spills. What can I do in the bathroom?  Use night lights.  Install grab bars by the toilet and in the tub and shower. Do not use towel bars as grab bars.  Use non-skid mats or decals in the tub or shower.  If you need to sit down in the shower, use a plastic, non-slip stool.  Keep the floor dry. Clean up any water that spills on the floor as soon as it happens.  Remove soap buildup in the tub or shower  regularly.  Attach bath mats securely with double-sided non-slip rug tape.  Do not have throw rugs and other things on the floor that can make you trip. What can I do in the bedroom?  Use night lights.  Make sure that you have a light by your bed that is easy to reach.  Do not use any sheets or blankets that are too big for your bed. They should not hang down onto the floor.  Have a firm chair that has side arms. You can use this for support while you get dressed.  Do not have throw rugs and other things on the floor that can make you trip. What can I do in the kitchen?  Clean up any spills right away.  Avoid walking on wet floors.  Keep items that you use a lot in easy-to-reach places.  If you need to reach something above you, use a strong step stool that has a grab bar.  Keep electrical cords out of the way.  Do not use floor polish or wax that makes floors slippery. If you must use wax, use non-skid floor wax.  Do not have throw rugs and other things on the floor that can make  you trip. What can I do with my stairs?  Do not leave any items on the stairs.  Make sure that there are handrails on both sides of the stairs and use them. Fix handrails that are broken or loose. Make sure that handrails are as long as the stairways.  Check any carpeting to make sure that it is firmly attached to the stairs. Fix any carpet that is loose or worn.  Avoid having throw rugs at the top or bottom of the stairs. If you do have throw rugs, attach them to the floor with carpet tape.  Make sure that you have a light switch at the top of the stairs and the bottom of the stairs. If you do not have them, ask someone to add them for you. What else can I do to help prevent falls?  Wear shoes that:  Do not have high heels.  Have rubber bottoms.  Are comfortable and fit you well.  Are closed at the toe. Do not wear sandals.  If you use a stepladder:  Make sure that it is fully  opened. Do not climb a closed stepladder.  Make sure that both sides of the stepladder are locked into place.  Ask someone to hold it for you, if possible.  Clearly mark and make sure that you can see:  Any grab bars or handrails.  First and last steps.  Where the edge of each step is.  Use tools that help you move around (mobility aids) if they are needed. These include:  Canes.  Walkers.  Scooters.  Crutches.  Turn on the lights when you go into a dark area. Replace any light bulbs as soon as they burn out.  Set up your furniture so you have a clear path. Avoid moving your furniture around.  If any of your floors are uneven, fix them.  If there are any pets around you, be aware of where they are.  Review your medicines with your doctor. Some medicines can make you feel dizzy. This can increase your chance of falling. Ask your doctor what other things that you can do to help prevent falls. This information is not intended to replace advice given to you by your health care provider. Make sure you discuss any questions you have with your health care provider. Document Released: 09/14/2009 Document Revised: 04/25/2016 Document Reviewed: 12/23/2014 Elsevier Interactive Patient Education  2017 [employer].

## 2020-07-17 DIAGNOSIS — J449 Chronic obstructive pulmonary disease, unspecified: Secondary | ICD-10-CM | POA: Diagnosis not present

## 2020-07-17 DIAGNOSIS — I5032 Chronic diastolic (congestive) heart failure: Secondary | ICD-10-CM | POA: Diagnosis not present

## 2020-07-17 DIAGNOSIS — I110 Hypertensive heart disease with heart failure: Secondary | ICD-10-CM | POA: Diagnosis not present

## 2020-07-17 DIAGNOSIS — M6281 Muscle weakness (generalized): Secondary | ICD-10-CM | POA: Diagnosis not present

## 2020-07-17 DIAGNOSIS — I509 Heart failure, unspecified: Secondary | ICD-10-CM | POA: Diagnosis not present

## 2020-07-17 DIAGNOSIS — R2681 Unsteadiness on feet: Secondary | ICD-10-CM | POA: Diagnosis not present

## 2020-07-17 DIAGNOSIS — F0390 Unspecified dementia without behavioral disturbance: Secondary | ICD-10-CM | POA: Diagnosis not present

## 2020-07-19 ENCOUNTER — Ambulatory Visit: Payer: Medicare Other | Admitting: Family Medicine

## 2020-07-24 ENCOUNTER — Encounter (INDEPENDENT_AMBULATORY_CARE_PROVIDER_SITE_OTHER): Payer: Medicare Other | Admitting: Ophthalmology

## 2020-07-24 ENCOUNTER — Other Ambulatory Visit: Payer: Self-pay

## 2020-07-24 DIAGNOSIS — H353221 Exudative age-related macular degeneration, left eye, with active choroidal neovascularization: Secondary | ICD-10-CM

## 2020-07-24 DIAGNOSIS — I10 Essential (primary) hypertension: Secondary | ICD-10-CM

## 2020-07-24 DIAGNOSIS — H353112 Nonexudative age-related macular degeneration, right eye, intermediate dry stage: Secondary | ICD-10-CM | POA: Diagnosis not present

## 2020-07-24 DIAGNOSIS — H35033 Hypertensive retinopathy, bilateral: Secondary | ICD-10-CM

## 2020-07-24 DIAGNOSIS — H43813 Vitreous degeneration, bilateral: Secondary | ICD-10-CM

## 2020-07-25 ENCOUNTER — Ambulatory Visit (INDEPENDENT_AMBULATORY_CARE_PROVIDER_SITE_OTHER): Payer: Medicare Other | Admitting: Cardiovascular Disease

## 2020-07-25 ENCOUNTER — Encounter: Payer: Self-pay | Admitting: Cardiovascular Disease

## 2020-07-25 DIAGNOSIS — I70219 Atherosclerosis of native arteries of extremities with intermittent claudication, unspecified extremity: Secondary | ICD-10-CM | POA: Diagnosis not present

## 2020-07-25 DIAGNOSIS — I10 Essential (primary) hypertension: Secondary | ICD-10-CM

## 2020-07-25 DIAGNOSIS — E782 Mixed hyperlipidemia: Secondary | ICD-10-CM | POA: Diagnosis not present

## 2020-07-25 DIAGNOSIS — I4811 Longstanding persistent atrial fibrillation: Secondary | ICD-10-CM | POA: Diagnosis not present

## 2020-07-25 DIAGNOSIS — Z87891 Personal history of nicotine dependence: Secondary | ICD-10-CM | POA: Diagnosis not present

## 2020-07-25 NOTE — Patient Instructions (Signed)

## 2020-07-25 NOTE — Assessment & Plan Note (Signed)
History of PAD status post right common iliac stenting by myself 05/03/2014 with a 7 mm x 22 mm long I cast covered stent.  She did have a small abdominal aortic aneurysm, an occluded left renal artery and bilateral SFA occlusions.  She came back on 09/04/2015 and I revascularized her right SFA CTA using directional atherectomy and drug-coated balloon angioplasty.  Follow-up Dopplers have shown subsequent occlusion of her right SFA.  Her most recent Dopplers performed 06/16/2020 revealed a right ABI of 0.75 and a left of 0.67 with occluded right SFA.  She is minimally ambulatory and walks with a walker at home but denies claudication.

## 2020-07-25 NOTE — Assessment & Plan Note (Signed)
History of essential hypertension with blood pressure measured today at 130/90.  She is on Avapro.

## 2020-07-25 NOTE — Assessment & Plan Note (Signed)
History of new onset A. fib rate controlled on Eliquis oral anticoagulation.

## 2020-07-25 NOTE — Assessment & Plan Note (Signed)
History of former tobacco abuse having quit 20 years ago with COPD

## 2020-07-25 NOTE — Progress Notes (Signed)
07/25/2020 Cheryl Atkinson   Mar 25, 1931  [id number]  Primary Physician Durene Cal [name], MD Primary Cardiologist: Runell Gess MD Milagros Loll, [place], [place]  HPI:  Cheryl Atkinson is a 86 y.o.  African-American female who requiring podiatry procedure on her right foot. She was sent to me by Dr. Ardelle Anton for claudication and peripheral vascular disease. I last saw her in the 07/20/2019. I stented her right common iliac artery back in June. She did have occluded SFAs bilaterally. She underwent staged right SFA directional atherectomy and drug-eluting balloon angioplasty on 09/04/15 with subsequent Dopplers that showed significant improvement in her right ABI. She no longer has claudication on that side. She does have a palpable pedal pulse. Her problems include discontinued tobacco abuse, hypertension and hyperlipidemia.since I saw her lastshe has had an episode of substernal chest pain and was evaluated in the emergency room. It was thought secondary to GERD. She has had Doppler studies of her lower extremities that show a decline in her right ABI with the development of a high-frequency signal in her mid right SFA although she says her back pain limits her more than her legs at this time.Since I saw her a year ago she's remained clinically stable. She denies chest pain does get some shortness of breath especially when singing in church. She does have COPD.  Since I saw her a year ago she has had laparoscopic cholecystectomy back in May and was readmitted with altered mental status and hypercarbia.  She was found to have a perihepatic/subdiaphragmatic abscess with VRE and was placed on antibiotics.  She slowly recovered.  She has developed A. fib since I saw her last and is on Eliquis oral anticoagulation.  She is here with her caretaker, her cousin who lives down the street from her.  Recent 2D echo performed 05/28/2020 revealed normal LV function with mild to moderate MR.  She did have shunting at  the atrial level with left-to-right shunting across her atrial septum.    Current Meds  Medication Sig  . Albuterol Sulfate (PROAIR RESPICLICK) 108 (90 Base) MCG/ACT AEPB Inhale 2 puffs into the lungs every 6 (six) hours as needed (shortness of breath from COPD).  Marland Kitchen apixaban (ELIQUIS) 5 MG TABS tablet Take 1 tablet (5 mg total) by mouth 2 (two) times daily. Start 04/06/20  . atorvastatin (LIPITOR) 40 MG tablet Take 1 tablet (40 mg total) by mouth daily.  . bisacodyl (DULCOLAX) 10 MG suppository Place 1 suppository (10 mg total) rectally daily as needed for moderate constipation.  . furosemide (LASIX) 40 MG tablet Take 1 tablet (40 mg total) by mouth 2 (two) times daily.  . irbesartan (AVAPRO) 300 MG tablet Take 1 tablet (300 mg total) by mouth daily.  . Multiple Vitamins-Minerals (PRESERVISION AREDS 2 PO) Take 1 tablet by mouth 2 (two) times daily.  Marland Kitchen NEUPRO 3 MG/24HR PT24 PLACE 1 PATCH (3 MG) ONTO THE SKIN AT BEDTIME (Patient taking differently: Apply 1 patch topically at bedtime. Take off in the morning)  . polyethylene glycol (MIRALAX / GLYCOLAX) 17 g packet Take 17 g by mouth 2 (two) times daily. Reported on 03/14/2016 (Patient taking differently: Take 17 g by mouth daily as needed for mild constipation. )  . potassium chloride SA (KLOR-CON) 20 MEQ tablet Take 1 tablet (20 mEq total) by mouth 2 (two) times daily as needed. Needs to be taken  With each dose of lasix  . traZODone (DESYREL) 50 MG tablet Take 1.5 tablets (75  mg total) by mouth at bedtime as needed for sleep.     Allergies  Allergen Reactions  . Codeine Other (See Comments)    Syncope     Social History   Socioeconomic History  . Marital status: Widowed    Spouse name: Not on file  . Number of children: Not on file  . Years of education: Not on file  . Highest education level: Not on file  Occupational History  . Occupation: retired  Tobacco Use  . Smoking status: Former Smoker    Packs/day: 0.50    Years: 60.00     Pack years: 30.00    Types: Cigarettes    Quit date: 04/02/2015    Years since quitting: 5.3  . Smokeless tobacco: Never Used  Vaping Use  . Vaping Use: Never used  Substance and Sexual Activity  . Alcohol use: No  . Drug use: No  . Sexual activity: Not Currently  Other Topics Concern  . Not on file  Social History Narrative   Widowed 2013. 2 sons. 1 grandchild.    Son can help on weekends. Sister and sister in law could help as well. Thinks she may stop driving in next few years.       Retired from [employer].       Hobbies: time at home and with family      HCPOA: sister-in-law and brother. Deirdre Evener.       DNR/DNI      Regular exercise: none   Caffeine use: cup of coffee    Social Determinants of Health   Financial Resource Strain: Low Risk   . Difficulty of Paying Living Expenses: Not hard at all  Food Insecurity: No Food Insecurity  . Worried About Programme researcher, broadcasting/film/video in the Last Year: Never true  . Ran Out of Food in the Last Year: Never true  Transportation Needs: No Transportation Needs  . Lack of Transportation (Medical): No  . Lack of Transportation (Non-Medical): No  Physical Activity: Unknown  . Days of Exercise per Week: 1 day  . Minutes of Exercise per Session: Not on file  Stress: No Stress Concern Present  . Feeling of Stress : Not at all  Social Connections: Moderately Isolated  . Frequency of Communication with Friends and Family: More than three times a week  . Frequency of Social Gatherings with Friends and Family: Twice a week  . Attends Religious Services: More than 4 times per year  . Active Member of Clubs or Organizations: No  . Attends Banker Meetings: Never  . Marital Status: Widowed  Intimate Partner Violence: Not At Risk  . Fear of Current or Ex-Partner: No  . Emotionally Abused: No  . Physically Abused: No  . Sexually Abused: No     Review of Systems: General: negative for chills, fever, night sweats or weight  changes.  Cardiovascular: negative for chest pain, dyspnea on exertion, edema, orthopnea, palpitations, paroxysmal nocturnal dyspnea or shortness of breath Dermatological: negative for rash Respiratory: negative for cough or wheezing Urologic: negative for hematuria Abdominal: negative for nausea, vomiting, diarrhea, bright red blood per rectum, melena, or hematemesis Neurologic: negative for visual changes, syncope, or dizziness All other systems reviewed and are otherwise negative except as noted above.    Blood pressure 130/90, pulse 66, height 5\' 6"  (1.676 m), weight 138 lb (62.6 kg).  General appearance: alert and no distress Neck: no adenopathy, no carotid bruit, no JVD, supple, symmetrical, trachea midline and  thyroid not enlarged, symmetric, no tenderness/mass/nodules Lungs: clear to auscultation bilaterally Heart: irregularly irregular rhythm Extremities: extremities normal, atraumatic, no cyanosis or edema Pulses: Absent pedal pulses Skin: Skin color, texture, turgor normal. No rashes or lesions Neurologic: Alert and oriented X 3, normal strength and tone. Normal symmetric reflexes. Normal coordination and gait  EKG atrial fibrillation with ventricular sponsor 66 poor R wave progression.  I personally reviewed this EKG.  ASSESSMENT AND PLAN:   Essential hypertension History of essential hypertension with blood pressure measured today at 130/90.  She is on Avapro.  Atrial fibrillation (HCC) History of new onset A. fib rate controlled on Eliquis oral anticoagulation.  Former smoker History of former tobacco abuse having quit 20 years ago with COPD  Atherosclerotic PVD with intermittent claudication (HCC) History of PAD status post right common iliac stenting by myself 05/03/2014 with a 7 mm x 22 mm long I cast covered stent.  She did have a small abdominal aortic aneurysm, an occluded left renal artery and bilateral SFA occlusions.  She came back on 09/04/2015 and I  revascularized her right SFA CTA using directional atherectomy and drug-coated balloon angioplasty.  Follow-up Dopplers have shown subsequent occlusion of her right SFA.  Her most recent Dopplers performed 06/16/2020 revealed a right ABI of 0.75 and a left of 0.67 with occluded right SFA.  She is minimally ambulatory and walks with a walker at home but denies claudication.  Hyperlipidemia History of hyperlipidemia on statin therapy with lipid profile performed 03/15/2020 revealing an LDL 55.      Runell Gess MD [facility] [place], [facility] 07/25/2020 11:17 AM

## 2020-07-25 NOTE — Assessment & Plan Note (Signed)
History of hyperlipidemia on statin therapy with lipid profile performed 03/15/2020 revealing an LDL 55.

## 2020-07-27 DIAGNOSIS — M6281 Muscle weakness (generalized): Secondary | ICD-10-CM | POA: Diagnosis not present

## 2020-07-27 DIAGNOSIS — F0390 Unspecified dementia without behavioral disturbance: Secondary | ICD-10-CM | POA: Diagnosis not present

## 2020-07-27 DIAGNOSIS — I110 Hypertensive heart disease with heart failure: Secondary | ICD-10-CM | POA: Diagnosis not present

## 2020-07-27 DIAGNOSIS — R2681 Unsteadiness on feet: Secondary | ICD-10-CM | POA: Diagnosis not present

## 2020-07-27 DIAGNOSIS — J449 Chronic obstructive pulmonary disease, unspecified: Secondary | ICD-10-CM | POA: Diagnosis not present

## 2020-07-27 DIAGNOSIS — I5032 Chronic diastolic (congestive) heart failure: Secondary | ICD-10-CM | POA: Diagnosis not present

## 2020-08-01 DIAGNOSIS — I5032 Chronic diastolic (congestive) heart failure: Secondary | ICD-10-CM | POA: Diagnosis not present

## 2020-08-01 DIAGNOSIS — M6281 Muscle weakness (generalized): Secondary | ICD-10-CM | POA: Diagnosis not present

## 2020-08-01 DIAGNOSIS — I110 Hypertensive heart disease with heart failure: Secondary | ICD-10-CM | POA: Diagnosis not present

## 2020-08-01 DIAGNOSIS — J449 Chronic obstructive pulmonary disease, unspecified: Secondary | ICD-10-CM | POA: Diagnosis not present

## 2020-08-01 DIAGNOSIS — F0390 Unspecified dementia without behavioral disturbance: Secondary | ICD-10-CM | POA: Diagnosis not present

## 2020-08-01 DIAGNOSIS — R2681 Unsteadiness on feet: Secondary | ICD-10-CM | POA: Diagnosis not present

## 2020-08-21 ENCOUNTER — Encounter (INDEPENDENT_AMBULATORY_CARE_PROVIDER_SITE_OTHER): Payer: Medicare Other | Admitting: Ophthalmology

## 2020-08-21 ENCOUNTER — Other Ambulatory Visit: Payer: Self-pay

## 2020-08-21 DIAGNOSIS — H353221 Exudative age-related macular degeneration, left eye, with active choroidal neovascularization: Secondary | ICD-10-CM

## 2020-08-21 DIAGNOSIS — H43813 Vitreous degeneration, bilateral: Secondary | ICD-10-CM | POA: Diagnosis not present

## 2020-08-21 DIAGNOSIS — H353112 Nonexudative age-related macular degeneration, right eye, intermediate dry stage: Secondary | ICD-10-CM

## 2020-08-21 DIAGNOSIS — I10 Essential (primary) hypertension: Secondary | ICD-10-CM

## 2020-08-21 DIAGNOSIS — H35033 Hypertensive retinopathy, bilateral: Secondary | ICD-10-CM | POA: Diagnosis not present

## 2020-08-28 NOTE — Progress Notes (Signed)
Phone [phone] In person visit   Subjective:   Cheryl Atkinson is a 86 y.o. year old very pleasant female patient who presents for/with See problem oriented charting Chief Complaint  Patient presents with  . Hypertension  . Hyperlipidemia  . Depression   This visit occurred during the SARS-CoV-2 public health emergency.  Safety protocols were in place, including screening questions prior to the visit, additional usage of staff PPE, and extensive cleaning of exam room while observing appropriate contact time as indicated for disinfecting solutions.   Past Medical History-  Patient Active Problem List   Diagnosis Date Noted  . Pleural effusion due to CHF (congestive heart failure) (HCC) 05/26/2020    Priority: High  . Heart failure with preserved ejection fraction (HCC) 03/01/2020    Priority: High  . Bacteremia 01/28/2020    Priority: High  . Atrial fibrillation (HCC) 01/25/2020    Priority: High  . Memory loss 04/11/2016    Priority: High  . Renal artery stenosis (HCC) 09/27/2015    Priority: High  . Claudication (HCC) 05/04/2015    Priority: High  . Atherosclerotic PVD with intermittent claudication (HCC) 04/26/2015    Priority: High  . DNR (do not resuscitate) 09/29/2014    Priority: High  . Hyperglycemia 07/19/2014    Priority: High  . LOW BACK PAIN 09/25/2007    Priority: High  . Insomnia 03/01/2020    Priority: Medium  . Major depression 03/25/2018    Priority: Medium  . BPPV (benign paroxysmal positional vertigo) 07/12/2016    Priority: Medium  . Hyperlipidemia 06/30/2015    Priority: Medium  . Former smoker 09/29/2014    Priority: Medium  . COPD (chronic obstructive pulmonary disease) (HCC) 09/20/2009    Priority: Medium  . Iron deficiency anemia 11/09/2007    Priority: Medium  . RESTLESS LEG SYNDROME 09/25/2007    Priority: Medium  . Essential hypertension 09/25/2007    Priority: Medium  . GERD 09/25/2007    Priority: Medium  . Osteoporosis  09/25/2007    Priority: Medium  . S/P laparoscopic cholecystectomy 04/04/2020    Priority: Low  . Abdominal aortic ectasia (HCC) 12/29/2019    Priority: Low  . Cat bite of right hand 12/21/2016    Priority: Low  . Mallet toe of right foot 10/20/2015    Priority: Low  . Tinnitus 12/31/2014    Priority: Low  . Multinodular goiter 08/30/2013    Priority: Low  . Spinal stenosis of lumbar region at multiple levels 09/29/2012    Priority: Low  . HIP PAIN, BILATERAL 07/16/2010    Priority: Low  . CONSTIPATION, CHRONIC 09/20/2009    Priority: Low  . History of UTI 11/09/2007    Priority: Low  . Protein-calorie malnutrition, severe 05/31/2020  . Adrenal nodule (HCC) 12/29/2019    Medications- reviewed and updated Current Outpatient Medications  Medication Sig Dispense Refill  . Albuterol Sulfate (PROAIR RESPICLICK) 108 (90 Base) MCG/ACT AEPB Inhale 2 puffs into the lungs every 6 (six) hours as needed (shortness of breath from COPD). 1 each 5  . apixaban (ELIQUIS) 5 MG TABS tablet Take 1 tablet (5 mg total) by mouth 2 (two) times daily. Start 04/06/20 60 tablet 11  . atorvastatin (LIPITOR) 40 MG tablet Take 1 tablet (40 mg total) by mouth daily. 90 tablet 3  . bisacodyl (DULCOLAX) 10 MG suppository Place 1 suppository (10 mg total) rectally daily as needed for moderate constipation. 12 suppository 0  . furosemide (LASIX) 40 MG tablet Take  1 tablet (40 mg total) by mouth 2 (two) times daily. 60 tablet 5  . irbesartan (AVAPRO) 300 MG tablet Take 1 tablet (300 mg total) by mouth daily. 90 tablet 3  . Multiple Vitamins-Minerals (PRESERVISION AREDS 2 PO) Take 1 tablet by mouth 2 (two) times daily.    Marland Kitchen NEUPRO 3 MG/24HR PT24 PLACE 1 PATCH (3 MG) ONTO THE SKIN AT BEDTIME (Patient taking differently: Apply 1 patch topically at bedtime. Take off in the morning) 90 patch 3  . polyethylene glycol (MIRALAX / GLYCOLAX) 17 g packet Take 17 g by mouth 2 (two) times daily. Reported on 03/14/2016 (Patient  taking differently: Take 17 g by mouth daily as needed for mild constipation. )    . potassium chloride SA (KLOR-CON) 20 MEQ tablet Take 1 tablet (20 mEq total) by mouth 2 (two) times daily as needed. Needs to be taken  With each dose of lasix 60 tablet 5  . traZODone (DESYREL) 50 MG tablet Take 1.5 tablets (75 mg total) by mouth at bedtime as needed for sleep. 20 tablet 0   No current facility-administered medications for this visit.     Objective:  BP 122/84   Pulse 84   Temp 98.2 F (36.8 C) (Temporal)   Resp 18   Ht 5\' 6"  (1.676 m)   Wt 140 lb 12.8 oz (63.9 kg)   LMP  (LMP Unknown)   SpO2 98%   BMI 22.73 kg/m  Gen: NAD, resting comfortably CV: irregularly irregular  Lungs: CTAB no crackles, wheeze, rhonchi Abdomen: soft/nontender/nondistended/normal bowel sounds. No rebound or guarding.  Ext: no edema Skin: warm, dry    Assessment and Plan   # high dose flu shot today  # Atrial fibrillation- new onset 2021 S: Rate controlled without medication  Anticoagulated with eliquis 5 mg BID Patient is  followed by cardiology: Dr. 2022  A/P: rate controlled and anticoagulated appropriately- continue current medicine   #CHF with preserved ejection fraction- has cut down on salt intake  #Hypertension S: Compliant with irbesartan 300 mg (ok for unilateral renal artery stenosis per cards)  and lasix 40mg  in AM. Only uses 2nd dose if increased swelling.  -off hctz since starting lasix -off amlodiine march 2021 with edema issues BP Readings from Last 3 Encounters:  08/29/20 122/84  07/25/20 130/90  07/13/20 136/72  A/P: blood pressure is well controlled- continue current medicine.     CHF- weight up slightly but swelling minimal. Will continue Lasix 40 mg in the morning and if weight increases her swelling increases can take a second dose in the day.  Typically would recommend twice a day if weight increases 3 pounds in a day or 5 pounds over 3 days. Continue to limit salt intake.     #Major depression S: Patient has denied counseling and medications in the past.  Seems to have been helped with staying more engaged with friends and family. A/P: phq2 of 0 today- appears in full remission- continue to monitor.      #Restless leg syndrome S: Patient on Neupro 3 mg (max dose).  Ferritin levels have been low in the past- she is on iron right now. Occurs in daytime but worse at night Lab Results  Component Value Date   FERRITIN 42 05/29/2020  A/P: not ideal control but at max dose- continue current meds and iron     #Hyperlipidemia/peripheral vascular disease/renal artery stenosis  S: Compliant with atorvastatin 40 mg and also on Plavix/asa per Dr. 09/12/20 in past  now on eliquis 5 mg instead.  She has known atherosclerotic peripheral vascular disease with intermittent claudication-stable with claudication at approximately 200 feet. Saw Dr. Allyson Sabal last month  Patient with right renal artery stenosis on angiogram June 2016.  She is on eliquis and statin. Lab Results  Component Value Date   CHOL 237 (H) 10/05/2019   HDL 92.00 10/05/2019   LDLCALC 129 (H) 10/05/2019   LDLDIRECT 55.0 03/15/2020   TRIG 79.0 10/05/2019   CHOLHDL 3 10/05/2019  A/P: cholesterol at goal in April with LDL under 70- continue current meds. For PAD and renal artery stenosis- continue eliquis and statin.      #Insulin resistance/hyperglycemia S: Patient is compliant with metformin 500 milligrams daily Lab Results  Component Value Date   HGBA1C 6.1 (H) 05/29/2020  A/P: hopefully stable- too soon for repeat a1c -prior low normal b12- will check again next isit- she declines for now. I wonder if low b12 could contribute to sensation in legs. Start cyanocoblamin/vitamin b12 1000 mcg daily to see if that makes a difference with legs- we will check next visit  Recommended follow up: Return in about 3 months (around 11/28/2020). Future Appointments  Date Time Provider Department Center  09/18/2020  8:15  AM Sherrie George, MD [facility] None  07/20/2021  9:30 AM [facility]    Lab/Order associations: No diagnosis found.  No orders of the defined types were placed in this encounter.   Return precautions advised.  Tana Conch, MD

## 2020-08-28 NOTE — Patient Instructions (Addendum)
Health Maintenance Due  Topic Date Due  . INFLUENZA VACCINE In office flu shot high dose 07/02/2020   Start cyanocoblamin/vitamin b12 1000 mcg daily to see if that makes a difference with legs- we will check next visit  Skip labs today and update next visit- see you in 3 months! Have a wonderful holiday season if I dont see you before then

## 2020-08-29 ENCOUNTER — Other Ambulatory Visit: Payer: Self-pay

## 2020-08-29 ENCOUNTER — Ambulatory Visit (INDEPENDENT_AMBULATORY_CARE_PROVIDER_SITE_OTHER): Payer: Medicare Other | Admitting: Family Medicine

## 2020-08-29 ENCOUNTER — Encounter: Payer: Self-pay | Admitting: Family Medicine

## 2020-08-29 VITALS — BP 122/84 | HR 84 | Temp 98.2°F | Resp 18 | Ht 66.0 in | Wt 140.8 lb

## 2020-08-29 DIAGNOSIS — I70219 Atherosclerosis of native arteries of extremities with intermittent claudication, unspecified extremity: Secondary | ICD-10-CM | POA: Diagnosis not present

## 2020-08-29 DIAGNOSIS — E782 Mixed hyperlipidemia: Secondary | ICD-10-CM

## 2020-08-29 DIAGNOSIS — D509 Iron deficiency anemia, unspecified: Secondary | ICD-10-CM | POA: Diagnosis not present

## 2020-08-29 DIAGNOSIS — I10 Essential (primary) hypertension: Secondary | ICD-10-CM | POA: Diagnosis not present

## 2020-08-29 DIAGNOSIS — I739 Peripheral vascular disease, unspecified: Secondary | ICD-10-CM

## 2020-08-29 DIAGNOSIS — I5032 Chronic diastolic (congestive) heart failure: Secondary | ICD-10-CM | POA: Diagnosis not present

## 2020-08-29 DIAGNOSIS — I4811 Longstanding persistent atrial fibrillation: Secondary | ICD-10-CM | POA: Diagnosis not present

## 2020-08-29 DIAGNOSIS — R739 Hyperglycemia, unspecified: Secondary | ICD-10-CM

## 2020-08-29 DIAGNOSIS — G2581 Restless legs syndrome: Secondary | ICD-10-CM | POA: Diagnosis not present

## 2020-09-11 ENCOUNTER — Telehealth: Payer: Self-pay

## 2020-09-11 ENCOUNTER — Other Ambulatory Visit: Payer: Self-pay

## 2020-09-11 MED ORDER — APIXABAN 5 MG PO TABS: 5.0000 mg | ORAL_TABLET | Freq: Two times a day (BID) | ORAL | 3 refills | Status: DC

## 2020-09-11 NOTE — Telephone Encounter (Signed)
apixaban (ELIQUIS) 5 MG TABS tablet  Pt would like this medication changed to a 90 day supply, instead of 30 day.

## 2020-09-11 NOTE — Telephone Encounter (Signed)
Medication has been sent as a 90 day supply.

## 2020-09-11 NOTE — Telephone Encounter (Signed)
  LAST APPOINTMENT DATE: 08/29/2020   NEXT APPOINTMENT DATE:@1 /01/2021  MEDICATION: apixaban (ELIQUIS) 5 MG TABS tablet  PHARMACY: [facility] - [place], [place] - [address] (Ph: [phone])  Please advise

## 2020-09-11 NOTE — Telephone Encounter (Signed)
Refill sent in

## 2020-09-18 ENCOUNTER — Encounter (INDEPENDENT_AMBULATORY_CARE_PROVIDER_SITE_OTHER): Payer: Medicare Other | Admitting: Ophthalmology

## 2020-09-18 ENCOUNTER — Other Ambulatory Visit: Payer: Self-pay

## 2020-09-18 DIAGNOSIS — H35033 Hypertensive retinopathy, bilateral: Secondary | ICD-10-CM

## 2020-09-18 DIAGNOSIS — I10 Essential (primary) hypertension: Secondary | ICD-10-CM

## 2020-09-18 DIAGNOSIS — H353221 Exudative age-related macular degeneration, left eye, with active choroidal neovascularization: Secondary | ICD-10-CM | POA: Diagnosis not present

## 2020-09-18 DIAGNOSIS — H353112 Nonexudative age-related macular degeneration, right eye, intermediate dry stage: Secondary | ICD-10-CM

## 2020-09-18 DIAGNOSIS — H43813 Vitreous degeneration, bilateral: Secondary | ICD-10-CM

## 2020-09-26 ENCOUNTER — Telehealth: Payer: Self-pay

## 2020-09-26 NOTE — Telephone Encounter (Signed)
10:30AM: Palliative care SW outreached patient for telephonic visit.   Left HIPPA compliant VM. Awaiting return call.

## 2020-10-10 ENCOUNTER — Telehealth: Payer: Self-pay

## 2020-10-10 NOTE — Telephone Encounter (Signed)
955 am.  Telephonic Visit completed with patient.  Reviewed medications with patient and there are no new changes.  Patient notes her appetite has been really good over the last week.  She is eating 3 meals a day.  Patient will occasionally cook for herself and sometimes she has help with meals.  Her weight is maintaining at 140 lbs.  Patient denies swelling to lower extremities but states she has restless leg syndrome.  Patient notes this is ongoing both day and night.  No shortness of breath, wheezing, dizziness reported by patient.  Patient is using a walker for safe ambulation.  She reports no recent falls.  Patient reports regular bowel movements and occasional use of prn medications to address constipation.  Patient states she will have a follow up tomorrow with ophthalmology to address macular degeneration.  She is currently getting shots to her eye but does not feel this is helpful.  Patient will be consulting with MD regarding further treatment options.  Patient states overall she is doing well and symptoms are being managed well at this time.   Advised Palliative Care would follow up with patient again next month and instructed to call with any concerns.

## 2020-10-16 ENCOUNTER — Encounter (INDEPENDENT_AMBULATORY_CARE_PROVIDER_SITE_OTHER): Payer: Medicare Other | Admitting: Ophthalmology

## 2020-10-16 ENCOUNTER — Other Ambulatory Visit: Payer: Self-pay

## 2020-10-16 DIAGNOSIS — H35033 Hypertensive retinopathy, bilateral: Secondary | ICD-10-CM | POA: Diagnosis not present

## 2020-10-16 DIAGNOSIS — H353221 Exudative age-related macular degeneration, left eye, with active choroidal neovascularization: Secondary | ICD-10-CM

## 2020-10-16 DIAGNOSIS — H35371 Puckering of macula, right eye: Secondary | ICD-10-CM

## 2020-10-16 DIAGNOSIS — H43813 Vitreous degeneration, bilateral: Secondary | ICD-10-CM

## 2020-10-16 DIAGNOSIS — H353112 Nonexudative age-related macular degeneration, right eye, intermediate dry stage: Secondary | ICD-10-CM | POA: Diagnosis not present

## 2020-10-16 DIAGNOSIS — I10 Essential (primary) hypertension: Secondary | ICD-10-CM

## 2020-11-07 ENCOUNTER — Other Ambulatory Visit: Payer: Self-pay

## 2020-11-07 NOTE — Telephone Encounter (Signed)
LR: 04-17-2020 Qty: 20 with 0 refills Last office visit: 08-29-2020 Upcoming appointment: 12-04-2020.  Medication was prescribed by Colon Branch, MD

## 2020-11-07 NOTE — Telephone Encounter (Signed)
You may refill as #45 and we can check in at January visit on how its working. It appears was loaded as print but can send in e prescribe/normal

## 2020-11-07 NOTE — Telephone Encounter (Signed)
  LAST APPOINTMENT DATE: 07/13/2020  NEXT APPOINTMENT DATE:@1 /01/2021  MEDICATION:traZODone (DESYREL) 50 MG tablet  PHARMACY: [id number] - [place], [place] - [address] AT [facility] OF [address]  COMMENTS: Patient is completely out.   Please advise

## 2020-11-08 MED ORDER — TRAZODONE HCL 50 MG PO TABS: 75.0000 mg | ORAL_TABLET | Freq: Every evening | ORAL | 0 refills | Status: DC | PRN

## 2020-11-08 NOTE — Telephone Encounter (Signed)
Medication filled.  

## 2020-11-13 ENCOUNTER — Other Ambulatory Visit: Payer: Self-pay

## 2020-11-13 ENCOUNTER — Encounter (INDEPENDENT_AMBULATORY_CARE_PROVIDER_SITE_OTHER): Payer: Medicare Other | Admitting: Ophthalmology

## 2020-11-13 DIAGNOSIS — H35033 Hypertensive retinopathy, bilateral: Secondary | ICD-10-CM

## 2020-11-13 DIAGNOSIS — H353221 Exudative age-related macular degeneration, left eye, with active choroidal neovascularization: Secondary | ICD-10-CM

## 2020-11-13 DIAGNOSIS — I10 Essential (primary) hypertension: Secondary | ICD-10-CM | POA: Diagnosis not present

## 2020-11-13 DIAGNOSIS — H43813 Vitreous degeneration, bilateral: Secondary | ICD-10-CM

## 2020-11-13 DIAGNOSIS — H353112 Nonexudative age-related macular degeneration, right eye, intermediate dry stage: Secondary | ICD-10-CM

## 2020-11-22 ENCOUNTER — Telehealth: Payer: Self-pay

## 2020-11-22 NOTE — Telephone Encounter (Signed)
Patient called in and states that she was having some swelling in her feet and she's SOB. She states that it started this morning, she did take her lasix and it has gone down some but not enough. She wants to know what she should do. We did advise her to go to Urgent care or the ED but she states that she's not going and wants to know what you would recommend.

## 2020-11-22 NOTE — Telephone Encounter (Signed)
Lets have her take the lasix twice a day and schedule a follow up early next week with one of my colleagues since im out of town- if worsening symptoms urgent care or ED needs to be reinforced. If she doesn't improve on meds or worsens- should seek care sooner than monday

## 2020-11-23 NOTE — Telephone Encounter (Signed)
Called and gave pt below message and she has been scheduled for [place] @8 :40.

## 2020-11-27 ENCOUNTER — Telehealth: Payer: Self-pay

## 2020-11-27 NOTE — Telephone Encounter (Signed)
11:30AM: Palliative care SW outreached patient to complete telephonic visit.   Palliative care SW outreached patient to complete telephonic visit. Patient provided update on medical condition and/or changes. Patient shared that she was concerned about some swelling in her feet recently and was more SHOB than usual. Patient was advised by PCP office to increase her Lasix BID, patient shared that this has helped a little and her swelling has gone down. Patient has an appointment with PCP office on 12/04/20. Patient shared that otherwise she feel fine and is doing okay and does not have any other concerns. Patient denies any recent falls. Patient states that her appetite is good and has no trouble with meals. Patient states that she feels she has gained weight but she is not sure how much is fluid. SW advised that patient keeps her feet elevated as much as possible, patient shared that she does. Patient continues to be ambulatory with RW and has caregiver and son available for assistance. No other psychosocial needs identified.   Palliative care will continue to monitor and assist with long term care planning as needed and outreach again for in person/home visit.

## 2020-11-29 ENCOUNTER — Ambulatory Visit: Payer: Medicare Other | Admitting: Family Medicine

## 2020-12-02 NOTE — Progress Notes (Signed)
Phone [phone] In person visit   Subjective:   Cheryl Atkinson is a 87 y.o. year old very pleasant female patient who presents for/with See problem oriented charting Chief Complaint  Patient presents with  . Hypertension  . Depression  . Hyperlipidemia  . Diabetes    This visit occurred during the SARS-CoV-2 public health emergency.  Safety protocols were in place, including screening questions prior to the visit, additional usage of staff PPE, and extensive cleaning of exam room while observing appropriate contact time as indicated for disinfecting solutions.   Past Medical History-  Patient Active Problem List   Diagnosis Date Noted  . Pleural effusion due to CHF (congestive heart failure) (HCC) 05/26/2020    Priority: High  . Heart failure with preserved ejection fraction (HCC) 03/01/2020    Priority: High  . Bacteremia 01/28/2020    Priority: High  . Atrial fibrillation (HCC) 01/25/2020    Priority: High  . Memory loss 04/11/2016    Priority: High  . Renal artery stenosis (HCC) 09/27/2015    Priority: High  . Claudication (HCC) 05/04/2015    Priority: High  . Atherosclerotic PVD with intermittent claudication (HCC) 04/26/2015    Priority: High  . DNR (do not resuscitate) 09/29/2014    Priority: High  . Hyperglycemia 07/19/2014    Priority: High  . LOW BACK PAIN 09/25/2007    Priority: High  . Insomnia 03/01/2020    Priority: Medium  . Major depression 03/25/2018    Priority: Medium  . BPPV (benign paroxysmal positional vertigo) 07/12/2016    Priority: Medium  . Hyperlipidemia 06/30/2015    Priority: Medium  . Former smoker 09/29/2014    Priority: Medium  . COPD (chronic obstructive pulmonary disease) (HCC) 09/20/2009    Priority: Medium  . Iron deficiency anemia 11/09/2007    Priority: Medium  . RESTLESS LEG SYNDROME 09/25/2007    Priority: Medium  . Essential hypertension 09/25/2007    Priority: Medium  . GERD 09/25/2007    Priority: Medium  .  Osteoporosis 09/25/2007    Priority: Medium  . S/P laparoscopic cholecystectomy 04/04/2020    Priority: Low  . Abdominal aortic ectasia (HCC) 12/29/2019    Priority: Low  . Cat bite of right hand 12/21/2016    Priority: Low  . Mallet toe of right foot 10/20/2015    Priority: Low  . Tinnitus 12/31/2014    Priority: Low  . Multinodular goiter 08/30/2013    Priority: Low  . Spinal stenosis of lumbar region at multiple levels 09/29/2012    Priority: Low  . HIP PAIN, BILATERAL 07/16/2010    Priority: Low  . CONSTIPATION, CHRONIC 09/20/2009    Priority: Low  . History of UTI 11/09/2007    Priority: Low  . Protein-calorie malnutrition, severe 05/31/2020  . Adrenal nodule (HCC) 12/29/2019    Medications- reviewed and updated Current Outpatient Medications  Medication Sig Dispense Refill  . Albuterol Sulfate (PROAIR RESPICLICK) 108 (90 Base) MCG/ACT AEPB Inhale 2 puffs into the lungs every 6 (six) hours as needed (shortness of breath from COPD). 1 each 5  . apixaban (ELIQUIS) 5 MG TABS tablet Take 1 tablet (5 mg total) by mouth 2 (two) times daily. Start 04/06/20 180 tablet 3  . atorvastatin (LIPITOR) 40 MG tablet Take 1 tablet (40 mg total) by mouth daily. 90 tablet 3  . bisacodyl (DULCOLAX) 10 MG suppository Place 1 suppository (10 mg total) rectally daily as needed for moderate constipation. 12 suppository 0  . furosemide (LASIX)  40 MG tablet Take 1 tablet (40 mg total) by mouth 2 (two) times daily. 60 tablet 5  . irbesartan (AVAPRO) 300 MG tablet Take 1 tablet (300 mg total) by mouth daily. 90 tablet 3  . Multiple Vitamins-Minerals (PRESERVISION AREDS 2 PO) Take 1 tablet by mouth 2 (two) times daily.    Marland Kitchen NEUPRO 3 MG/24HR PT24 PLACE 1 PATCH (3 MG) ONTO THE SKIN AT BEDTIME (Patient taking differently: Apply 1 patch topically at bedtime. Take off in the morning) 90 patch 3  . polyethylene glycol (MIRALAX / GLYCOLAX) 17 g packet Take 17 g by mouth 2 (two) times daily. Reported on 03/14/2016  (Patient taking differently: Take 17 g by mouth daily as needed for mild constipation.)    . potassium chloride SA (KLOR-CON) 20 MEQ tablet Take 1 tablet (20 mEq total) by mouth 2 (two) times daily as needed. Needs to be taken  With each dose of lasix 60 tablet 5  . traZODone (DESYREL) 50 MG tablet Take 1.5 tablets (75 mg total) by mouth at bedtime as needed for sleep. 135 tablet 3   No current facility-administered medications for this visit.     Objective:  BP 132/80   Pulse 80   Temp (!) 97.3 F (36.3 C) (Temporal)   Ht 5\' 6"  (1.676 m)   Wt 149 lb 12.8 oz (67.9 kg)   LMP  (LMP Unknown)   SpO2 100%   BMI 24.18 kg/m  Gen: NAD, resting comfortably CV: irregularly irregular no murmurs rubs or gallops Lungs: CTAB no crackles, wheeze, rhonchi Abdomen: soft/nontender/nondistended/normal bowel sounds. No rebound or guarding.  Ext: trace edema at ankle, 1+ in foot Skin: warm, dry    Assessment and Plan    # Atrial fibrillation- new onset 2021 S: Rate controlled without medication  Anticoagulated with eliquis 5 mg BID Patient is  followed by cardiology: Dr. 2022  A/P:  rate controlled without meds and appropriately anticoagulated- continue current rx  #CHF with preserved EF  #Hypertension S: Compliant with irbesartan 300 mg (ok per cards as only unilateral renal artery stenosis) and lasix 40mg  (she was on 40mg  in AM and 20 mg PM)- plan has been BID 40mg  if swelling or weight up- she started back on twice a day two weeks ago when swelling started and that helped . Weight up 9 lbs from last visit on 08/29/20 - son is here and cooking very well for her- thinks she is gaining some from eating more.  -off hctz since starting lasix -off amlodiine march 2021 with edema issues .Blood pressure high in past with caffeine intake BP Readings from Last 3 Encounters:  12/04/20 132/80  08/29/20 122/84  07/25/20 130/90  A/P: blood pressur well controlled. CHF appears poorly controlled- continue  lasix 40mg  twice a day until home weight back down to 130s- she is up to 144 on home scale and typically would be around 135 or less.    -limit salt intake- she is not adding salt but not watching salt in meals- asked her to be more careful   #Restless leg syndrome S: Patient on Neupro 3 mg.  Ferritin levels have been low in the past- have discussed taking iron - twice weekly Lab Results  Component Value Date   FERRITIN 42 05/29/2020  A/P: feels worse when legs swollen- hopeful will improve as gets fluid off. For now continue leupro and iron- could not get insurance to cover ferritin levels under restless legs     #Hyperlipidemia/peripheral vascular disease/renal  artery stenosis S: Compliant with atorvastatin 40 mg and also on Plavix/asa per Dr. Gwenlyn Found.  She has known atherosclerotic peripheral vascular disease with intermittent claudication-stable with claudication at approximately 200 feet.  stable Lab Results  Component Value Date   CHOL 237 (H) 10/05/2019   HDL 92.00 10/05/2019   LDLCALC 129 (H) 10/05/2019   LDLDIRECT 55.0 03/15/2020   TRIG 79.0 10/05/2019   CHOLHDL 3 10/05/2019  Patient with right renal artery stenosis on angiogram June 2016.  She is on Plavix and statin. A/P: PAD and renal artery stenosis appear stable need LDL to be below 70. Would continue plavix/aspirin/statin. Update lipid panel today     #Insulin resistance/hyperglycemia S: prior metformin 500 milligrams daily- diarrhea and low b12 Lab Results  Component Value Date   HGBA1C 6.1 (H) 05/29/2020  A/P: hopefully stable- or improved- update a1c. Check b12 levels- suspect will be better off metformin. She is now off b12.      #COPD S: Finds albuterol helpful starting 2020.  She had improvement in shortness of breath when she quit smoking.   A/P: stable- continue current meds    #insomnia- continue trazodone- 3 months refill  #depression in full remisison with phq2 of 0- continue to monitor.   # prior adrenal  nodule not noted on CT June 2021- will discontinue this.  Recommended follow up: Return in about 4 weeks (around 01/01/2021) for follow up- or sooner if needed. Future Appointments  Date Time Provider [place]  12/11/2020  7:45 AM Hayden Pedro, MD [facility] None  07/20/2021  9:30 AM [facility] CO[facility]    Lab/Order associations:   ICD-10-CM   1. Essential hypertension  I10   2. Gastroesophageal reflux disease without esophagitis  K21.9   3. Mixed hyperlipidemia  E78.2 CBC with Differential/Platelet    Comprehensive metabolic panel    Lipid panel  4. Recurrent major depressive disorder, in full remission ([place])  F33.42   5. Hyperglycemia  R73.9 Hemoglobin A1c  6. High risk medication use  Z79.899 Vitamin B12  7. RESTLESS LEG SYNDROME  G25.81     Meds ordered this encounter  Medications  . traZODone (DESYREL) 50 MG tablet    Sig: Take 1.5 tablets (75 mg total) by mouth at bedtime as needed for sleep.    Dispense:  135 tablet    Refill:  3   Return precautions advised.  Garret Reddish, MD

## 2020-12-02 NOTE — Patient Instructions (Addendum)
Health Maintenance Due  Topic Date Due  . INFLUENZA VACCINE - done last visit- team to enter 07/02/2020  . COVID-19 Vaccine (3 - Booster for [employer] series)- please call to get this scheduled. High priority  [email or web address] 09/28/2020   Lets continue lasix 40mg  twice daily as long as kidneys are ok and your home weights get back down under 140. If they get under 140 can and swelling down- can go back to 40 mg in am and 20 mg in PM  Please stop by lab before you go If you have mychart- we will send your results within 3 business days of receiving them.  If you do not have mychart- we will call you about results within 5 business days of [place] receiving them.  *please also note that you will see labs on mychart as soon as they post. I will later go in and write notes on them- will say "notes from Dr. [place]"

## 2020-12-04 ENCOUNTER — Other Ambulatory Visit: Payer: Self-pay

## 2020-12-04 ENCOUNTER — Ambulatory Visit (INDEPENDENT_AMBULATORY_CARE_PROVIDER_SITE_OTHER): Payer: Medicare Other | Admitting: Family Medicine

## 2020-12-04 ENCOUNTER — Encounter: Payer: Self-pay | Admitting: Family Medicine

## 2020-12-04 VITALS — BP 132/80 | HR 80 | Temp 97.3°F | Ht 66.0 in | Wt 149.8 lb

## 2020-12-04 DIAGNOSIS — I70219 Atherosclerosis of native arteries of extremities with intermittent claudication, unspecified extremity: Secondary | ICD-10-CM

## 2020-12-04 DIAGNOSIS — I5032 Chronic diastolic (congestive) heart failure: Secondary | ICD-10-CM

## 2020-12-04 DIAGNOSIS — E782 Mixed hyperlipidemia: Secondary | ICD-10-CM | POA: Diagnosis not present

## 2020-12-04 DIAGNOSIS — J449 Chronic obstructive pulmonary disease, unspecified: Secondary | ICD-10-CM | POA: Diagnosis not present

## 2020-12-04 DIAGNOSIS — I701 Atherosclerosis of renal artery: Secondary | ICD-10-CM | POA: Diagnosis not present

## 2020-12-04 DIAGNOSIS — K219 Gastro-esophageal reflux disease without esophagitis: Secondary | ICD-10-CM

## 2020-12-04 DIAGNOSIS — E278 Other specified disorders of adrenal gland: Secondary | ICD-10-CM

## 2020-12-04 DIAGNOSIS — I739 Peripheral vascular disease, unspecified: Secondary | ICD-10-CM

## 2020-12-04 DIAGNOSIS — F3342 Major depressive disorder, recurrent, in full remission: Secondary | ICD-10-CM

## 2020-12-04 DIAGNOSIS — I10 Essential (primary) hypertension: Secondary | ICD-10-CM | POA: Diagnosis not present

## 2020-12-04 DIAGNOSIS — R739 Hyperglycemia, unspecified: Secondary | ICD-10-CM | POA: Diagnosis not present

## 2020-12-04 DIAGNOSIS — G2581 Restless legs syndrome: Secondary | ICD-10-CM

## 2020-12-04 DIAGNOSIS — I4811 Longstanding persistent atrial fibrillation: Secondary | ICD-10-CM

## 2020-12-04 DIAGNOSIS — Z79899 Other long term (current) drug therapy: Secondary | ICD-10-CM | POA: Diagnosis not present

## 2020-12-04 LAB — HEMOGLOBIN A1C: Hgb A1c MFr Bld: 6.2 % (ref 4.6–6.5)

## 2020-12-04 LAB — COMPREHENSIVE METABOLIC PANEL
ALT: 11 U/L (ref 0–35)
AST: 18 U/L (ref 0–37)
Albumin: 4.1 g/dL (ref 3.5–5.2)
Alkaline Phosphatase: 122 U/L — ABNORMAL HIGH (ref 39–117)
BUN: 22 mg/dL (ref 6–23)
CO2: 26 mEq/L (ref 19–32)
Calcium: 9.2 mg/dL (ref 8.4–10.5)
Chloride: 103 mEq/L (ref 96–112)
Creatinine, Ser: 0.89 mg/dL (ref 0.40–1.20)
GFR: 58.35 mL/min — ABNORMAL LOW (ref >60.00)
Glucose, Bld: 102 mg/dL — ABNORMAL HIGH (ref 70–99)
Potassium: 3.8 mEq/L (ref 3.5–5.1)
Sodium: 138 mEq/L (ref 135–145)
Total Bilirubin: 0.6 mg/dL (ref 0.2–1.2)
Total Protein: 7.5 g/dL (ref 6.0–8.3)

## 2020-12-04 LAB — CBC WITH DIFFERENTIAL/PLATELET
Basophils Absolute: 0.1 10*3/uL (ref 0.0–0.1)
Basophils Relative: 1 % (ref 0.0–3.0)
Eosinophils Absolute: 0.1 10*3/uL (ref 0.0–0.7)
Eosinophils Relative: 2.5 % (ref 0.0–5.0)
HCT: 34.8 % — ABNORMAL LOW (ref 36.0–46.0)
Hemoglobin: 11.4 g/dL — ABNORMAL LOW (ref 12.0–15.0)
Lymphocytes Relative: 26.5 % (ref 12.0–46.0)
Lymphs Abs: 1.4 10*3/uL (ref 0.7–4.0)
MCHC: 32.9 g/dL (ref 30.0–36.0)
MCV: 89.2 fl (ref 78.0–100.0)
Monocytes Absolute: 0.5 10*3/uL (ref 0.1–1.0)
Monocytes Relative: 8.8 % (ref 3.0–12.0)
Neutro Abs: 3.3 10*3/uL (ref 1.4–7.7)
Neutrophils Relative %: 61.2 % (ref 43.0–77.0)
Platelets: 255 10*3/uL (ref 150.0–400.0)
RBC: 3.9 Mil/uL (ref 3.87–5.11)
RDW: 14.5 % (ref 11.5–15.5)
WBC: 5.3 10*3/uL (ref 4.0–10.5)

## 2020-12-04 LAB — LIPID PANEL
Cholesterol: 169 mg/dL (ref 0–200)
HDL: 80.5 mg/dL (ref >39.00)
LDL Cholesterol: 78 mg/dL (ref 0–99)
NonHDL: 88.38
Total CHOL/HDL Ratio: 2
Triglycerides: 54 mg/dL (ref 0.0–149.0)
VLDL: 10.8 mg/dL (ref 0.0–40.0)

## 2020-12-04 LAB — VITAMIN B12: Vitamin B-12: 108 pg/mL — ABNORMAL LOW (ref 211–911)

## 2020-12-04 MED ORDER — TRAZODONE HCL 50 MG PO TABS: 75.0000 mg | ORAL_TABLET | Freq: Every evening | ORAL | 3 refills | Status: DC | PRN

## 2020-12-04 MED ORDER — FUROSEMIDE 40 MG PO TABS: 40.0000 mg | ORAL_TABLET | Freq: Two times a day (BID) | ORAL | 3 refills | Status: DC

## 2020-12-04 MED ORDER — POTASSIUM CHLORIDE CRYS ER 20 MEQ PO TBCR
20.0000 meq | EXTENDED_RELEASE_TABLET | Freq: Two times a day (BID) | ORAL | 3 refills | Status: DC | PRN
Start: 2020-12-04 — End: 2020-12-08

## 2020-12-04 MED ORDER — ATORVASTATIN CALCIUM 40 MG PO TABS: 40.0000 mg | ORAL_TABLET | Freq: Every day | ORAL | 3 refills | Status: DC

## 2020-12-04 MED ORDER — IRBESARTAN 300 MG PO TABS
300.0000 mg | ORAL_TABLET | Freq: Every day | ORAL | 3 refills | Status: DC
Start: 2020-12-04 — End: 2022-06-05

## 2020-12-04 MED ORDER — APIXABAN 5 MG PO TABS: 5.0000 mg | ORAL_TABLET | Freq: Two times a day (BID) | ORAL | 3 refills | Status: DC

## 2020-12-04 MED ORDER — NEUPRO 3 MG/24HR TD PT24: MEDICATED_PATCH | TRANSDERMAL | 3 refills | Status: DC

## 2020-12-04 MED ORDER — PROAIR RESPICLICK 108 (90 BASE) MCG/ACT IN AEPB
2.0000 | INHALATION_SPRAY | Freq: Four times a day (QID) | RESPIRATORY_TRACT | 5 refills | Status: DC | PRN
Start: 2020-12-04 — End: 2022-10-01

## 2020-12-04 NOTE — Assessment & Plan Note (Signed)
#   prior adrenal nodule not noted on CT June 2021- will discontinue this.

## 2020-12-05 ENCOUNTER — Telehealth: Payer: Self-pay

## 2020-12-05 ENCOUNTER — Other Ambulatory Visit: Payer: Self-pay

## 2020-12-05 MED ORDER — VITAMIN B-12 1000 MCG SL SUBL: 1000.0000 ug | SUBLINGUAL_TABLET | SUBLINGUAL | 3 refills | Status: DC

## 2020-12-05 MED ORDER — ROSUVASTATIN CALCIUM 40 MG PO TABS: 40.0000 mg | ORAL_TABLET | Freq: Every day | ORAL | 3 refills | Status: DC

## 2020-12-05 NOTE — Telephone Encounter (Signed)
355 pm.  Phone call made to patient to schedule and in-person visit.  Patient is agreeable to Monday at 1 pm.

## 2020-12-08 ENCOUNTER — Telehealth: Payer: Self-pay

## 2020-12-08 ENCOUNTER — Other Ambulatory Visit: Payer: Self-pay

## 2020-12-08 MED ORDER — POTASSIUM CHLORIDE CRYS ER 20 MEQ PO TBCR: 20.0000 meq | EXTENDED_RELEASE_TABLET | Freq: Two times a day (BID) | ORAL | 3 refills | Status: DC | PRN

## 2020-12-08 NOTE — Telephone Encounter (Signed)
Patient had a question regarding b12 shots can we please call back. Thank you

## 2020-12-08 NOTE — Telephone Encounter (Signed)
See last result note-you can set her up with injections weekly for a month

## 2020-12-08 NOTE — Telephone Encounter (Signed)
Spoke with the patent, she states that she would rather get the b12 injections and not take the pill anymore. She changed her mind since she last spoke with Melitta on 12/06/2019.

## 2020-12-11 ENCOUNTER — Other Ambulatory Visit: Payer: Self-pay

## 2020-12-11 ENCOUNTER — Other Ambulatory Visit: Payer: Medicare Other

## 2020-12-11 ENCOUNTER — Encounter (INDEPENDENT_AMBULATORY_CARE_PROVIDER_SITE_OTHER): Payer: Medicare Other | Admitting: Ophthalmology

## 2020-12-11 VITALS — BP 118/62 | HR 65 | Temp 97.3°F | Resp 20 | Wt 143.4 lb

## 2020-12-11 DIAGNOSIS — H43813 Vitreous degeneration, bilateral: Secondary | ICD-10-CM

## 2020-12-11 DIAGNOSIS — I10 Essential (primary) hypertension: Secondary | ICD-10-CM

## 2020-12-11 DIAGNOSIS — H353221 Exudative age-related macular degeneration, left eye, with active choroidal neovascularization: Secondary | ICD-10-CM

## 2020-12-11 DIAGNOSIS — H35033 Hypertensive retinopathy, bilateral: Secondary | ICD-10-CM

## 2020-12-11 DIAGNOSIS — Z515 Encounter for palliative care: Secondary | ICD-10-CM

## 2020-12-11 DIAGNOSIS — H353112 Nonexudative age-related macular degeneration, right eye, intermediate dry stage: Secondary | ICD-10-CM | POA: Diagnosis not present

## 2020-12-11 NOTE — Progress Notes (Signed)
PATIENT NAME: Cheryl Atkinson DOB: 05-21-1931 MRN: [id number]  PRIMARY CARE PROVIDER: Marin Olp, MD  RESPONSIBLE PARTY:  Acct ID - Guarantor Home Phone Work Phone Relationship Acct Type  [id number] ADASIA, [id number] [phone]  Self P/F     [address], [place], [place] [address]    PLAN OF CARE and INTERVENTIONS:               1.  GOALS OF CARE/ ADVANCE CARE PLANNING:  Remain home with a private sitter and assistance of son.               2.  PATIENT/CAREGIVER EDUCATION:  Safety, weights and fluid retention.               4. PERSONAL EMERGENCY PLAN:  Activate 911 for emergencies.               5.  DISEASE STATUS:  Visit completed with patient, sister and caregiver.  Patient is found in her lift chair with rolling walker in front of her.  She is ambulatory with the walker.  No falls are reported.  Patient instructed to keep her walker with her for safety.  She also has a Scientist, research (medical).  She provides an update on her overall condition since her last in-person visit from Palliative Care.  Patient is able to dress herself independently but has some difficulty with her socks/shoes.  She toilets herself and wears poise pads for some dribbling.  Patient denies any issues with urinary tract symptoms.  Patient reports having regular bowel movements.  She does take prn miralax but this has not been needed recently.  Patient denies any shortness of breath.  She is currently not on any O2.  She has inhaler that she uses as needed but states this has not required in months.  Patient lives in the home with her son.  She has a private caregiver that has set hours during the weekdays.  Aide provides supervision, assists with medications, and meal prep.  Patient confirms weighing herself daily.  Patient states she is pretty consistent with her weight being 143 lbs.  Re-enforced contacting provider for weight gain of 3 lbs in a day or 5 lbs in 1 week.  Patient feels she is doing well  overall.  Medications reviewed and patient is compliant with listed medications. Encouraged to keep upcoming appointments  Reviewed MOST form and goals of care.  Patient confirms she does not wish to return to the hospital, desires comfort care, abt if needed, ivf if needed and no tube feeding.  She does have a DNR in the home.  This will be uploaded into Vynca.    HISTORY OF PRESENT ILLNESS:  87 year old female with Atrial Fib and hx of HTN.  Patient is being followed by Palliative Care monthly and PRN.  CODE STATUS: DNR  ADVANCED DIRECTIVES: No MOST FORM: Yes PPS: 50%   PHYSICAL EXAM:   VITALS:  Temp 97.3 F, BP 118/62, P 65, R 20 O2 sats 97% on RA. LUNGS: CTA, no shortness of breath CARDIAC: HRR EXTREMITIES: 1+ bilateral lower extremities. Patient did not take her lasix this am due to her doctor's appt.   SKIN: Warm and dry to touch.  No skin breakdown reported. NEURO: Alert and oriented x3       Lorenza Burton, RN

## 2020-12-12 NOTE — Telephone Encounter (Signed)
Patient has been scheduled for all 4 of her b12 injections.

## 2020-12-20 ENCOUNTER — Other Ambulatory Visit: Payer: Self-pay

## 2020-12-20 ENCOUNTER — Ambulatory Visit: Payer: Medicare Other

## 2020-12-20 ENCOUNTER — Ambulatory Visit (INDEPENDENT_AMBULATORY_CARE_PROVIDER_SITE_OTHER): Payer: Medicare Other

## 2020-12-20 DIAGNOSIS — E559 Vitamin D deficiency, unspecified: Secondary | ICD-10-CM

## 2020-12-20 MED ORDER — CYANOCOBALAMIN 1000 MCG/ML IJ SOLN
1000.0000 ug | Freq: Once | INTRAMUSCULAR | Status: AC
  Administered 2020-12-20: 1000 ug via INTRAMUSCULAR

## 2020-12-27 ENCOUNTER — Encounter (INDEPENDENT_AMBULATORY_CARE_PROVIDER_SITE_OTHER): Payer: Medicare Other | Admitting: Ophthalmology

## 2020-12-27 ENCOUNTER — Ambulatory Visit (INDEPENDENT_AMBULATORY_CARE_PROVIDER_SITE_OTHER): Payer: Medicare Other | Admitting: *Deleted

## 2020-12-27 DIAGNOSIS — E538 Deficiency of other specified B group vitamins: Secondary | ICD-10-CM

## 2020-12-27 MED ORDER — CYANOCOBALAMIN 1000 MCG/ML IJ SOLN
1000.0000 ug | Freq: Once | INTRAMUSCULAR | Status: AC
Start: 2020-12-27 — End: 2020-12-27
  Administered 2020-12-27: 1000 ug via INTRAMUSCULAR

## 2020-12-27 NOTE — Progress Notes (Signed)
Patient presents for B12 injection today. Patient received her B12 injection in Right Deltoid. Patient tolerated injection well.  Documentation entered in MAR in EpicCare.   

## 2021-01-01 NOTE — Progress Notes (Signed)
Phone [phone] In person visit   Subjective:   Cheryl Atkinson is a 87 y.o. year old very pleasant female patient who presents for/with See problem oriented charting Chief Complaint  Patient presents with  . Hypertension  . Hyperlipidemia  . COPD  . Atrial Fibrillation  . Depression   This visit occurred during the SARS-CoV-2 public health emergency.  Safety protocols were in place, including screening questions prior to the visit, additional usage of staff PPE, and extensive cleaning of exam room while observing appropriate contact time as indicated for disinfecting solutions.   Past Medical History-  Patient Active Problem List   Diagnosis Date Noted  . Pleural effusion due to CHF (congestive heart failure) (HCC) 05/26/2020    Priority: High  . Heart failure with preserved ejection fraction (HCC) 03/01/2020    Priority: High  . Bacteremia 01/28/2020    Priority: High  . Atrial fibrillation (HCC) 01/25/2020    Priority: High  . Memory loss 04/11/2016    Priority: High  . Renal artery stenosis (HCC) 09/27/2015    Priority: High  . Claudication (HCC) 05/04/2015    Priority: High  . Atherosclerotic PVD with intermittent claudication (HCC) 04/26/2015    Priority: High  . DNR (do not resuscitate) 09/29/2014    Priority: High  . Hyperglycemia 07/19/2014    Priority: High  . LOW BACK PAIN 09/25/2007    Priority: High  . Insomnia 03/01/2020    Priority: Medium  . Major depression 03/25/2018    Priority: Medium  . BPPV (benign paroxysmal positional vertigo) 07/12/2016    Priority: Medium  . Hyperlipidemia 06/30/2015    Priority: Medium  . Former smoker 09/29/2014    Priority: Medium  . COPD (chronic obstructive pulmonary disease) (HCC) 09/20/2009    Priority: Medium  . Iron deficiency anemia 11/09/2007    Priority: Medium  . RESTLESS LEG SYNDROME 09/25/2007    Priority: Medium  . Essential hypertension 09/25/2007    Priority: Medium  . GERD 09/25/2007     Priority: Medium  . Osteoporosis 09/25/2007    Priority: Medium  . S/P laparoscopic cholecystectomy 04/04/2020    Priority: Low  . Abdominal aortic ectasia (HCC) 12/29/2019    Priority: Low  . Cat bite of right hand 12/21/2016    Priority: Low  . Mallet toe of right foot 10/20/2015    Priority: Low  . Tinnitus 12/31/2014    Priority: Low  . Multinodular goiter 08/30/2013    Priority: Low  . Spinal stenosis of lumbar region at multiple levels 09/29/2012    Priority: Low  . HIP PAIN, BILATERAL 07/16/2010    Priority: Low  . CONSTIPATION, CHRONIC 09/20/2009    Priority: Low  . History of UTI 11/09/2007    Priority: Low    Medications- reviewed and updated Current Outpatient Medications  Medication Sig Dispense Refill  . Albuterol Sulfate (PROAIR RESPICLICK) 108 (90 Base) MCG/ACT AEPB Inhale 2 puffs into the lungs every 6 (six) hours as needed (shortness of breath from COPD). 1 each 5  . apixaban (ELIQUIS) 5 MG TABS tablet Take 1 tablet (5 mg total) by mouth 2 (two) times daily. Start 04/06/20 180 tablet 3  . atorvastatin (LIPITOR) 40 MG tablet Take 1 tablet (40 mg total) by mouth daily. 90 tablet 3  . Cyanocobalamin (VITAMIN B-12) 1000 MCG SUBL Place 1 tablet (1,000 mcg total) under the tongue once a week. Once weekly for 4 weeks. And then once monthly. 4 tablet 3  . furosemide (LASIX)  40 MG tablet Take 1 tablet (40 mg total) by mouth 2 (two) times daily. 180 tablet 3  . irbesartan (AVAPRO) 300 MG tablet Take 1 tablet (300 mg total) by mouth daily. 90 tablet 3  . Multiple Vitamins-Minerals (PRESERVISION AREDS 2 PO) Take 1 tablet by mouth 2 (two) times daily.    . polyethylene glycol (MIRALAX / GLYCOLAX) 17 g packet Take 17 g by mouth 2 (two) times daily. Reported on 03/14/2016 (Patient taking differently: Take 17 g by mouth daily as needed for mild constipation.)    . potassium chloride SA (KLOR-CON) 20 MEQ tablet Take 1 tablet (20 mEq total) by mouth 2 (two) times daily as needed.  Needs to be taken  With each dose of lasix 180 tablet 3  . rosuvastatin (CRESTOR) 40 MG tablet Take 1 tablet (40 mg total) by mouth daily. Take 1 tablet by mouth daily. 90 tablet 3  . Rotigotine (NEUPRO) 3 MG/24HR PT24 PLACE 1 PATCH (3 MG) ONTO THE SKIN AT BEDTIME 90 patch 3  . traZODone (DESYREL) 50 MG tablet Take 1.5 tablets (75 mg total) by mouth at bedtime as needed for sleep. 135 tablet 3   Current Facility-Administered Medications  Medication Dose Route Frequency Provider Last Rate Last Admin  . cyanocobalamin ((VITAMIN B-12)) injection 1,000 mcg  1,000 mcg Intramuscular Once Shelva Majestic, MD         Objective:  BP (!) 152/84   Pulse 61   Temp 98.2 F (36.8 C) (Temporal)   Ht 5\' 6"  (1.676 m)   Wt 147 lb 3.2 oz (66.8 kg)   LMP  (LMP Unknown)   SpO2 99%   BMI 23.76 kg/m  Gen: NAD, resting comfortably CV: RRR no murmurs rubs or gallops Lungs: CTAB no crackles, wheeze, rhonchi Ext: 1+ edema Skin: warm, dry    Assessment and Plan   #CHF with preserved EF  #Hypertension S: Compliant with irbesartan 300 mg (ok per cards as only unilateral renal artery stenosis) and lasix 40mg   BID (increased before last visit) -did loosen up over tho holidays and weight went up as did swelling- seems to be doing better now.  Weight down 2 lbs in the last month- still would prefer 130s. Mild SOB with activity with this increase in fluid- hasnt improved much yet with weight loss -off hctz since starting lasix -off amlodiine march 2021 with edema issues .  Blood pressure high in past with caffeine intake A/P: CHF appears to be improving slightly- I think we need to continue current higher dose and watch sodium intake and follow up in another month    Systolic blood pressure above goal. I want her to continue to start checking at home and bring cuff and readings with her to next visit . Goal at home <140/90. Continue irbesartan 300mg  as well as lasix 40mg  twice daily. If blood pressure at home  is regularly >140/90 and not improving please let me know. Also did not take lasix before visit today which could be raising BP some    #Restless leg syndrome S: Patient on Neupro 3 mg.  Ferritin levels have been low in the past. She is on iron twice weekly. Was worse last visit with swollen legs- doing better now with swelling decrease in iron A/P: appears stable but still bothers her- continue current meds. Low b12 may have contributed -offered neurology visit as continues to have issues- she declines for now  # B12 deficiency S: Current treatment/medication (oral vs. IM): has had  3 injectoins since last visit  Lab Results  Component Value Date   VITAMINB12 108 (L) 12/04/2020  A/P: final injection today and then can start taking oral b12 1000 mcg also called cyanocobalamin to try to keep level up- check b12 next visit     #Hyperlipidemia/peripheral vascular disease/renal artery stenosis S: Compliant with atorvastatin 40 mg at last visit now advanced to rosuvastatin 40mg  and also on Plavix/asa per Dr. .  She has known atherosclerotic peripheral vascular disease with intermittent claudication-stable with claudication at approximately 200 feet.    Patient with right renal artery stenosis on angiogram June 2016.  She is on Plavix and statin. A/P: check LDL next visit with increased dose- hopefully better controlled- monitor for now  For renal artery stenosis- need better control of BP as well as LDL under 70. Continue plavix/aspirin  Recommended follow up: Return in about 1 month (around 01/30/2021) for follow up- or sooner if needed. Future Appointments  Date Time Provider Department Center  01/08/2021  8:30 AM 03/08/2021, MD [facility] None  01/10/2021  9:30 AM [facility] NURSE [facility]  07/20/2021  9:30 AM [facility] HEALTH COACH [facility]    Lab/Order associations:   ICD-10-CM   1. Chronic heart failure with preserved ejection fraction (HCC)  I50.32 Comprehensive metabolic  panel  2. B12 deficiency  E53.8 cyanocobalamin ((VITAMIN B-12)) injection 1,000 mcg  3. Essential hypertension  I10 Comprehensive metabolic panel  4. Renal artery stenosis (HCC)  I70.1   5. RESTLESS LEG SYNDROME  G25.81     Meds ordered this encounter  Medications  . cyanocobalamin ((VITAMIN B-12)) injection 1,000 mcg   Return precautions advised.  07/22/2021, MD

## 2021-01-01 NOTE — Patient Instructions (Addendum)
Health Maintenance Due  Topic Date Due  . COVID-19 Vaccine (3 - Booster for [employer] series)- recommend you get your booster at your pharmacy 09/28/2020   Systolic blood pressure above goal. I want her to continue to start checking at home and bring cuff and readings with her to next visit . Goal at home <140/90. Continue irbesartan 300mg  as well as lasix 40mg  twice daily. If blood pressure at home is regularly >140/90 and not improving please let me know  Final b12 injection today and then can start taking oral b12 1000 mcg also called cyanocobalamin to try to keep level up- check b12 next visit  Please stop by lab before you go If you have mychart- we will send your results within 3 business days of receiving them.  If you do not have mychart- we will call you about results within 5 business days of receiving them.  *please also note that you will see labs on mychart as soon as they post. I will later go in and write notes on them- will say "notes from Dr. [place]"

## 2021-01-02 ENCOUNTER — Ambulatory Visit (INDEPENDENT_AMBULATORY_CARE_PROVIDER_SITE_OTHER): Payer: Medicare Other | Admitting: Family Medicine

## 2021-01-02 ENCOUNTER — Encounter: Payer: Self-pay | Admitting: Family Medicine

## 2021-01-02 ENCOUNTER — Other Ambulatory Visit: Payer: Self-pay

## 2021-01-02 VITALS — BP 152/84 | HR 61 | Temp 98.2°F | Ht 66.0 in | Wt 147.2 lb

## 2021-01-02 DIAGNOSIS — G2581 Restless legs syndrome: Secondary | ICD-10-CM

## 2021-01-02 DIAGNOSIS — I5032 Chronic diastolic (congestive) heart failure: Secondary | ICD-10-CM | POA: Diagnosis not present

## 2021-01-02 DIAGNOSIS — E538 Deficiency of other specified B group vitamins: Secondary | ICD-10-CM | POA: Diagnosis not present

## 2021-01-02 DIAGNOSIS — I701 Atherosclerosis of renal artery: Secondary | ICD-10-CM | POA: Diagnosis not present

## 2021-01-02 DIAGNOSIS — I10 Essential (primary) hypertension: Secondary | ICD-10-CM | POA: Diagnosis not present

## 2021-01-02 LAB — COMPREHENSIVE METABOLIC PANEL
ALT: 13 U/L (ref 0–35)
AST: 20 U/L (ref 0–37)
Albumin: 3.9 g/dL (ref 3.5–5.2)
Alkaline Phosphatase: 132 U/L — ABNORMAL HIGH (ref 39–117)
BUN: 20 mg/dL (ref 6–23)
CO2: 28 mEq/L (ref 19–32)
Calcium: 9.2 mg/dL (ref 8.4–10.5)
Chloride: 103 mEq/L (ref 96–112)
Creatinine, Ser: 0.92 mg/dL (ref 0.40–1.20)
GFR: 56.05 mL/min — ABNORMAL LOW (ref >60.00)
Glucose, Bld: 93 mg/dL (ref 70–99)
Potassium: 3.5 mEq/L (ref 3.5–5.1)
Sodium: 138 mEq/L (ref 135–145)
Total Bilirubin: 0.5 mg/dL (ref 0.2–1.2)
Total Protein: 7.6 g/dL (ref 6.0–8.3)

## 2021-01-02 MED ORDER — CYANOCOBALAMIN 1000 MCG/ML IJ SOLN: 1000.0000 ug | Freq: Once | INTRAMUSCULAR | Status: DC

## 2021-01-02 MED ORDER — CYANOCOBALAMIN 1000 MCG/ML IJ SOLN
1000.0000 ug | Freq: Once | INTRAMUSCULAR | Status: AC
  Administered 2021-01-02: 1000 ug via INTRAMUSCULAR

## 2021-01-02 NOTE — Addendum Note (Signed)
Addended by: Daryll Brod on: 01/02/2021 09:55 AM   Modules accepted: Orders

## 2021-01-08 ENCOUNTER — Encounter (INDEPENDENT_AMBULATORY_CARE_PROVIDER_SITE_OTHER): Payer: Medicare Other | Admitting: Ophthalmology

## 2021-01-08 ENCOUNTER — Other Ambulatory Visit: Payer: Self-pay

## 2021-01-08 ENCOUNTER — Telehealth: Payer: Self-pay

## 2021-01-08 DIAGNOSIS — H35033 Hypertensive retinopathy, bilateral: Secondary | ICD-10-CM

## 2021-01-08 DIAGNOSIS — I10 Essential (primary) hypertension: Secondary | ICD-10-CM

## 2021-01-08 DIAGNOSIS — H353112 Nonexudative age-related macular degeneration, right eye, intermediate dry stage: Secondary | ICD-10-CM

## 2021-01-08 DIAGNOSIS — H353221 Exudative age-related macular degeneration, left eye, with active choroidal neovascularization: Secondary | ICD-10-CM

## 2021-01-08 DIAGNOSIS — H43813 Vitreous degeneration, bilateral: Secondary | ICD-10-CM

## 2021-01-08 NOTE — Telephone Encounter (Signed)
FYI

## 2021-01-08 NOTE — Telephone Encounter (Signed)
Team lets make sure she is using a walker- if she continues to have issues even with walker then we need to evaluate her. Please call and make sure no stroke like symptoms

## 2021-01-08 NOTE — Telephone Encounter (Signed)
Called and was unable to reach pt, no voicemail set up.

## 2021-01-08 NOTE — Telephone Encounter (Signed)
Pts sister in law called reporting pt fell on 01/05/2021 coming out of bathroom. Pt injured knee and elbow but is doing better. EMS was called to help pt come get up off floor. Pt also stumbled on Sunday 01/07/2021 when trying to get clothes out of closet and fell backwards onto bed.

## 2021-01-10 ENCOUNTER — Ambulatory Visit: Payer: Medicare Other

## 2021-01-14 ENCOUNTER — Emergency Department (HOSPITAL_COMMUNITY)
Admission: EM | Admit: 2021-01-14 | Discharge: 2021-01-14 | Disposition: A | Discharge status: Home / Self Care | Payer: Medicare Other | Attending: Emergency Medicine | Admitting: Emergency Medicine

## 2021-01-14 ENCOUNTER — Emergency Department (HOSPITAL_COMMUNITY): Payer: Medicare Other

## 2021-01-14 ENCOUNTER — Encounter (HOSPITAL_COMMUNITY): Payer: Self-pay

## 2021-01-14 DIAGNOSIS — M545 Low back pain, unspecified: Secondary | ICD-10-CM | POA: Diagnosis not present

## 2021-01-14 DIAGNOSIS — R140 Abdominal distension (gaseous): Secondary | ICD-10-CM | POA: Diagnosis not present

## 2021-01-14 DIAGNOSIS — S0990XA Unspecified injury of head, initial encounter: Secondary | ICD-10-CM | POA: Diagnosis not present

## 2021-01-14 DIAGNOSIS — W19XXXA Unspecified fall, initial encounter: Secondary | ICD-10-CM

## 2021-01-14 DIAGNOSIS — M5137 Other intervertebral disc degeneration, lumbosacral region: Secondary | ICD-10-CM | POA: Insufficient documentation

## 2021-01-14 DIAGNOSIS — M549 Dorsalgia, unspecified: Secondary | ICD-10-CM | POA: Diagnosis not present

## 2021-01-14 DIAGNOSIS — M25562 Pain in left knee: Secondary | ICD-10-CM | POA: Diagnosis not present

## 2021-01-14 DIAGNOSIS — R102 Pelvic and perineal pain: Secondary | ICD-10-CM | POA: Diagnosis not present

## 2021-01-14 DIAGNOSIS — Z043 Encounter for examination and observation following other accident: Secondary | ICD-10-CM | POA: Diagnosis not present

## 2021-01-14 DIAGNOSIS — S3992XA Unspecified injury of lower back, initial encounter: Secondary | ICD-10-CM | POA: Diagnosis not present

## 2021-01-14 DIAGNOSIS — R58 Hemorrhage, not elsewhere classified: Secondary | ICD-10-CM | POA: Diagnosis not present

## 2021-01-14 DIAGNOSIS — S0101XA Laceration without foreign body of scalp, initial encounter: Secondary | ICD-10-CM | POA: Diagnosis not present

## 2021-01-14 DIAGNOSIS — W01198A Fall on same level from slipping, tripping and stumbling with subsequent striking against other object, initial encounter: Secondary | ICD-10-CM | POA: Diagnosis not present

## 2021-01-14 DIAGNOSIS — R079 Chest pain, unspecified: Secondary | ICD-10-CM | POA: Diagnosis not present

## 2021-01-14 DIAGNOSIS — S8002XA Contusion of left knee, initial encounter: Secondary | ICD-10-CM | POA: Insufficient documentation

## 2021-01-14 DIAGNOSIS — Z23 Encounter for immunization: Secondary | ICD-10-CM | POA: Insufficient documentation

## 2021-01-14 DIAGNOSIS — T1490XA Injury, unspecified, initial encounter: Secondary | ICD-10-CM

## 2021-01-14 DIAGNOSIS — Z7901 Long term (current) use of anticoagulants: Secondary | ICD-10-CM | POA: Diagnosis not present

## 2021-01-14 DIAGNOSIS — Z20822 Contact with and (suspected) exposure to covid-19: Secondary | ICD-10-CM | POA: Diagnosis not present

## 2021-01-14 DIAGNOSIS — M546 Pain in thoracic spine: Secondary | ICD-10-CM | POA: Diagnosis not present

## 2021-01-14 LAB — PROTIME-INR
INR: 1.5 — ABNORMAL HIGH (ref 0.8–1.2)
Prothrombin Time: 18 seconds — ABNORMAL HIGH (ref 11.4–15.2)

## 2021-01-14 LAB — I-STAT CHEM 8, ED
BUN: 25 mg/dL — ABNORMAL HIGH (ref 8–23)
Calcium, Ion: 1.16 mmol/L (ref 1.15–1.40)
Chloride: 106 mmol/L (ref 98–111)
Creatinine, Ser: 1.1 mg/dL — ABNORMAL HIGH (ref 0.44–1.00)
Glucose, Bld: 102 mg/dL — ABNORMAL HIGH (ref 70–99)
HCT: 31 % — ABNORMAL LOW (ref 36.0–46.0)
Hemoglobin: 10.5 g/dL — ABNORMAL LOW (ref 12.0–15.0)
Potassium: 3.4 mmol/L — ABNORMAL LOW (ref 3.5–5.1)
Sodium: 144 mmol/L (ref 135–145)
TCO2: 25 mmol/L (ref 22–32)

## 2021-01-14 LAB — URINALYSIS, ROUTINE W REFLEX MICROSCOPIC
Bilirubin Urine: NEGATIVE
Glucose, UA: NEGATIVE mg/dL
Ketones, ur: NEGATIVE mg/dL
Leukocytes,Ua: NEGATIVE
Nitrite: NEGATIVE
Protein, ur: 30 mg/dL — AB
Specific Gravity, Urine: 1.02 (ref 1.005–1.030)
pH: 7 (ref 5.0–8.0)

## 2021-01-14 LAB — CBC
HCT: 34.1 % — ABNORMAL LOW (ref 36.0–46.0)
Hemoglobin: 10.5 g/dL — ABNORMAL LOW (ref 12.0–15.0)
MCH: 28.2 pg (ref 26.0–34.0)
MCHC: 30.8 g/dL (ref 30.0–36.0)
MCV: 91.7 fL (ref 80.0–100.0)
Platelets: 239 10*3/uL (ref 150–400)
RBC: 3.72 MIL/uL — ABNORMAL LOW (ref 3.87–5.11)
RDW: 13.1 % (ref 11.5–15.5)
WBC: 6.3 10*3/uL (ref 4.0–10.5)
nRBC: 0 % (ref 0.0–0.2)

## 2021-01-14 LAB — COMPREHENSIVE METABOLIC PANEL
ALT: 18 U/L (ref 0–44)
AST: 20 U/L (ref 15–41)
Albumin: 3.2 g/dL — ABNORMAL LOW (ref 3.5–5.0)
Alkaline Phosphatase: 123 U/L (ref 38–126)
Anion gap: 10 (ref 5–15)
BUN: 24 mg/dL — ABNORMAL HIGH (ref 8–23)
CO2: 25 mmol/L (ref 22–32)
Calcium: 8.8 mg/dL — ABNORMAL LOW (ref 8.9–10.3)
Chloride: 107 mmol/L (ref 98–111)
Creatinine, Ser: 1.14 mg/dL — ABNORMAL HIGH (ref 0.44–1.00)
GFR, Estimated: 47 mL/min — ABNORMAL LOW (ref >60)
Glucose, Bld: 113 mg/dL — ABNORMAL HIGH (ref 70–99)
Potassium: 3.3 mmol/L — ABNORMAL LOW (ref 3.5–5.1)
Sodium: 142 mmol/L (ref 135–145)
Total Bilirubin: 0.8 mg/dL (ref 0.3–1.2)
Total Protein: 7 g/dL (ref 6.5–8.1)

## 2021-01-14 LAB — URINALYSIS, MICROSCOPIC (REFLEX)

## 2021-01-14 LAB — ETHANOL: Alcohol, Ethyl (B): <10 mg/dL (ref <10)

## 2021-01-14 LAB — RESP PANEL BY RT-PCR (FLU A&B, COVID) ARPGX2
Influenza A by PCR: NEGATIVE
Influenza B by PCR: NEGATIVE
SARS Coronavirus 2 by RT PCR: NEGATIVE

## 2021-01-14 LAB — SAMPLE TO BLOOD BANK

## 2021-01-14 LAB — LACTIC ACID, PLASMA: Lactic Acid, Venous: 0.8 mmol/L (ref 0.5–1.9)

## 2021-01-14 MED ORDER — HYDROCODONE-ACETAMINOPHEN 5-325 MG PO TABS
1.0000 | ORAL_TABLET | Freq: Once | ORAL | Status: AC
  Administered 2021-01-14: 1 via ORAL
  Filled 2021-01-14: qty 1

## 2021-01-14 MED ORDER — ONDANSETRON HCL 4 MG/2ML IJ SOLN: 4.0000 mg | Freq: Once | INTRAMUSCULAR | Status: DC

## 2021-01-14 MED ORDER — FENTANYL CITRATE (PF) 100 MCG/2ML IJ SOLN: 50.0000 ug | Freq: Once | INTRAMUSCULAR | Status: DC

## 2021-01-14 MED ORDER — ONDANSETRON 4 MG PO TBDP
4.0000 mg | ORAL_TABLET | Freq: Once | ORAL | Status: AC
  Administered 2021-01-14: 4 mg via ORAL
  Filled 2021-01-14: qty 1

## 2021-01-14 MED ORDER — TETANUS-DIPHTH-ACELL PERTUSSIS 5-2.5-18.5 LF-MCG/0.5 IM SUSY
0.5000 mL | PREFILLED_SYRINGE | Freq: Once | INTRAMUSCULAR | Status: AC
  Administered 2021-01-14: 0.5 mL via INTRAMUSCULAR
  Filled 2021-01-14: qty 0.5

## 2021-01-14 NOTE — ED Notes (Signed)
Pt was unable to ambulate, reported "dizziness while standing, lower back pain and weakness".

## 2021-01-14 NOTE — ED Notes (Signed)
C-collar removed per pa

## 2021-01-14 NOTE — ED Provider Notes (Signed)
[facility] EMERGENCY DEPARTMENT Provider Note   CSN: [id number] Arrival date & time: 01/14/21  9563     History Chief Complaint  Patient presents with  . Fall    Cheryl Atkinson is a 87 y.o. female who presents emergency department with chief complaint of fall.  This is a level 2 trauma due to anticoagulation on Eliquis which the patient does not know why she takes and does not have listed health problems in the chart.  The patient was apparently reaching over to slip her shoes on when she lost her balance falling backward.  She hit her head against her bedside commode.  She complains of severe pain in her lower back.  She did not lose consciousness.  She denies changes in vision, new neuropathy or weakness.  She has some pain in the left knee from a fall about 1 week ago.  She has been able to ambulate on the knee but has had some difficulty and has been using her walker which is what she normally uses to ambulate.  She denies any chest pain, shortness of breath or abdominal pain. HPI     History reviewed. No pertinent past medical history.  There are no problems to display for this patient.   History reviewed. No pertinent surgical history.   OB History   No obstetric history on file.     No family history on file.     Home Medications Prior to Admission medications   Not on File    Allergies    Codeine  Review of Systems   Review of Systems Ten systems reviewed and are negative for acute change, except as noted in the HPI.  Physical Exam Updated Vital Signs BP (!) 160/90 Comment: manual   Pulse 83   Temp 97.8 F (36.6 C)   Resp 16   Ht 5\' 6"  (1.676 m)   Wt 81.6 kg   SpO2 99%   BMI 29.05 kg/m   Physical Exam Vitals and nursing note reviewed.  Constitutional:      Appearance: Normal appearance.  HENT:     Head: Normocephalic.     Comments: Laceration to the left frontal scalp with mild swelling.  No other obvious hematomas no  obvious facial trauma, no malocclusion, no battle signs, raccoon's eyes    Nose: Nose normal.     Mouth/Throat:     Mouth: Mucous membranes are moist.  Eyes:     Extraocular Movements: Extraocular movements intact.     Pupils: Pupils are equal, round, and reactive to light.  Neck:     Comments: C-collar in place Cardiovascular:     Rate and Rhythm: Normal rate and regular rhythm.  Pulmonary:     Effort: Pulmonary effort is normal.     Breath sounds: Normal breath sounds. No wheezing.  Abdominal:     General: There is distension.     Tenderness: There is no abdominal tenderness.  Musculoskeletal:     Comments: Tenderness in the lower thoracic upper lumbar region in the midline spine. Pelvis feels stable.  Normal mobility in the hips.  Limited range of motion of the left knee with swelling and bruising present as well as swelling in the lower extremity.  With pitting edema at the ankle.   Neurological:     General: No focal deficit present.     Mental Status: She is alert and oriented to person, place, and time.     Cranial Nerves: No cranial nerve  deficit.     Sensory: No sensory deficit.     Motor: No weakness.     Coordination: Coordination normal.     Gait: Gait normal.     Deep Tendon Reflexes: Reflexes normal.     ED Results / Procedures / Treatments   Labs (all labs ordered are listed, but only abnormal results are displayed) Labs Reviewed  RESP PANEL BY RT-PCR (FLU A&B, COVID) [id number]  COMPREHENSIVE METABOLIC PANEL  CBC  ETHANOL  URINALYSIS, ROUTINE W REFLEX MICROSCOPIC  LACTIC ACID, PLASMA  PROTIME-INR  I-STAT CHEM 8, ED  SAMPLE TO BLOOD BANK    EKG None  Radiology No results found.  Procedures Procedures   Medications Ordered in ED Medications  Tdap (BOOSTRIX) injection 0.5 mL (has no administration in time range)    ED Course  I have reviewed the triage vital signs and the nursing notes.  Pertinent labs & imaging results that were available  during my care of the patient were reviewed by me and considered in my medical decision making (see chart for details).    MDM Rules/Calculators/A&P                          87 year old female here after mechanical fall.  I ordered and reviewed labs which include CBC with mild anemia of insignificant value, CMP shows no acute abnormalities.  Urinalysis negative for infection, negative COVID and influenza testing.  He has no alcohol on board.  I ordered and reviewed images which include CT head and CT spine along with CT lumbar and thoracic spine all of which show no acute abnormalities.  Plain films of the thoracic and lumbar spine done prior to CT imaging also negative.  Portable pelvis, 1 view chest x-ray and left knee x-ray also showed no acute abnormalities.  Patient given oral pain medications which she did not tolerate well as it made her feel very woozy and drowsy.  I had difficulty controlling her pain.  The patient did not want to go home.  She does have care round-the-clock at home and and uses a walker.  I ordered a TLSO which gave the patient significant improvement in her discomfort and she was able to ambulate without significant pain.  Patient appears appropriate for discharge at this time. Final Clinical Impression(s) / ED Diagnoses Final diagnoses:  Trauma  Trauma  Trauma    Rx / DC Orders ED Discharge Orders    None       Arthor Captain, PA-C 01/16/21 1458    Margarita Grizzle, MD 01/23/21 0000

## 2021-01-14 NOTE — ED Notes (Signed)
Patient transported to X-ray 

## 2021-01-14 NOTE — Discharge Instructions (Addendum)
SEEK IMMEDIATE MEDICAL ATTENTION IF: New numbness, tingling, weakness, or problem with the use of your arms or legs.  Severe back pain not relieved with medications.  Change in bowel or bladder control.  Increasing pain in any areas of the body (such as chest or abdominal pain).  Shortness of breath, dizziness or fainting.  Nausea (feeling sick to your stomach), vomiting, fever, or sweats.  Do not scratch, rub, or pick at the adhesive. Leave tissue adhesive in place. It will come off naturally after 7-10 days. Do not place tape over the adhesive. The adhesive could come off the wound when you pull the tape off. Protect the wound from further injury until it is healed. Check your wound area every day for signs of infection. Check for: More redness, swelling, or pain,Fluid or blood,Warmth, Pus or a bad smell. Do not take baths, swim, or use a hot tub until your health care provider approves. You may only be allowed to take sponge baths. Ask your health care provider if you may take showers.You can usually shower after the first 24 hours. Cover the dressing with a watertight covering when you take a shower. Do not soak the area where there is tissue adhesive. Do not use any soaps, petroleum jelly products, or ointments on the wound. Certain ointments can weaken the adhesive.

## 2021-01-14 NOTE — ED Notes (Signed)
Pt comes via [facility] from home, was standing and lost her balance, fell, on eliquis, lac to forehead, c/o of lower back pain

## 2021-01-14 NOTE — ED Notes (Signed)
Patient transported to CT 

## 2021-01-14 NOTE — Progress Notes (Signed)
Orthopedic Tech Progress Note Patient Details:  Cheryl Atkinson [birth year] [id number]  Ortho Devices Type of Ortho Device: Lumbar corsett Ortho Device/Splint Location: Thoracic-Lumbar Ortho Device/Splint Interventions: Ordered,Application,Adjustment   Post Interventions Patient Tolerated: Well Instructions Provided: Adjustment of device,Care of device,Poper ambulation with device   Cheryl Atkinson [name] 01/14/2021, 3:03 PM

## 2021-01-15 ENCOUNTER — Encounter: Payer: Self-pay | Admitting: Family Medicine

## 2021-01-22 ENCOUNTER — Telehealth: Payer: Self-pay

## 2021-01-22 NOTE — Telephone Encounter (Signed)
1:15PM: Palliative care SW outreached patient/family for monthly telephonic visit.  SW unable to LVM on home phone. Patient cell phone mailbox is full and unable to leave VM as well.  Will continue to offer palliative care support.

## 2021-01-25 ENCOUNTER — Telehealth: Payer: Self-pay

## 2021-01-25 NOTE — Telephone Encounter (Signed)
  LAST APPOINTMENT DATE: 01/08/2021   NEXT APPOINTMENT DATE:@3 /06/2021  MEDICATION:atorvastatin (LIPITOR) 40 MG tablet  PHARMACY:[id number] - [place], [place] - [address] AT [facility] [address]  Comments:Patient only has two left.

## 2021-01-27 ENCOUNTER — Emergency Department (HOSPITAL_COMMUNITY): Payer: Medicare Other

## 2021-01-27 ENCOUNTER — Emergency Department (HOSPITAL_COMMUNITY)
Admission: EM | Admit: 2021-01-27 | Discharge: 2021-01-27 | Disposition: A | Discharge status: Home / Self Care | Payer: Medicare Other | Attending: Emergency Medicine | Admitting: Emergency Medicine

## 2021-01-27 ENCOUNTER — Other Ambulatory Visit: Payer: Self-pay

## 2021-01-27 DIAGNOSIS — S32020A Wedge compression fracture of second lumbar vertebra, initial encounter for closed fracture: Secondary | ICD-10-CM | POA: Insufficient documentation

## 2021-01-27 DIAGNOSIS — Z7901 Long term (current) use of anticoagulants: Secondary | ICD-10-CM | POA: Insufficient documentation

## 2021-01-27 DIAGNOSIS — Z87891 Personal history of nicotine dependence: Secondary | ICD-10-CM | POA: Insufficient documentation

## 2021-01-27 DIAGNOSIS — S3992XA Unspecified injury of lower back, initial encounter: Secondary | ICD-10-CM | POA: Diagnosis present

## 2021-01-27 DIAGNOSIS — M549 Dorsalgia, unspecified: Secondary | ICD-10-CM | POA: Diagnosis not present

## 2021-01-27 DIAGNOSIS — Z79899 Other long term (current) drug therapy: Secondary | ICD-10-CM | POA: Insufficient documentation

## 2021-01-27 DIAGNOSIS — J449 Chronic obstructive pulmonary disease, unspecified: Secondary | ICD-10-CM | POA: Insufficient documentation

## 2021-01-27 DIAGNOSIS — I110 Hypertensive heart disease with heart failure: Secondary | ICD-10-CM | POA: Diagnosis not present

## 2021-01-27 DIAGNOSIS — S32010A Wedge compression fracture of first lumbar vertebra, initial encounter for closed fracture: Secondary | ICD-10-CM | POA: Diagnosis not present

## 2021-01-27 DIAGNOSIS — W19XXXA Unspecified fall, initial encounter: Secondary | ICD-10-CM | POA: Insufficient documentation

## 2021-01-27 DIAGNOSIS — I509 Heart failure, unspecified: Secondary | ICD-10-CM | POA: Diagnosis not present

## 2021-01-27 DIAGNOSIS — M545 Low back pain, unspecified: Secondary | ICD-10-CM | POA: Diagnosis not present

## 2021-01-27 DIAGNOSIS — I10 Essential (primary) hypertension: Secondary | ICD-10-CM | POA: Diagnosis not present

## 2021-01-27 MED ORDER — HYDROCODONE-ACETAMINOPHEN 5-325 MG PO TABS
0.5000 | ORAL_TABLET | Freq: Once | ORAL | Status: AC
  Administered 2021-01-27: 0.5 via ORAL
  Filled 2021-01-27: qty 1

## 2021-01-27 MED ORDER — HYDROCODONE-ACETAMINOPHEN 5-325 MG PO TABS: 0.5000 | ORAL_TABLET | Freq: Four times a day (QID) | ORAL | 0 refills | Status: DC | PRN

## 2021-01-27 NOTE — ED Triage Notes (Signed)
Pt BIB [facility] arriving from home after suffering a fall one week ago on a blood thinner. Pt received xray's and CT scan which all came back clear. Pt arrives today because she is still experiencing 10/10 lower back pain the the R side. Pt was prescribed a muscle relaxer which has not provided the patient with any relief. Pt describing it as a sharp pain. VSS per EMS. Pt NAD at this time.

## 2021-01-27 NOTE — ED Provider Notes (Signed)
[facility] EMERGENCY DEPARTMENT Provider Note  CSN: [id number] Arrival date & time: 01/27/21 0174    History Chief Complaint  Patient presents with  . Back Pain    HPI  Cheryl Atkinson is a 87 y.o. female with multiple medical problems had a fall on 2/13, evaluated in the ED with negative imaging. She had some low back pain then but no fractures on CT. She was given TLSO for comfort, Rx for muscle relaxers, apparently got dizzy with norco 5/325mg  given in the ED. She reports continued constant R lumbar back pain, does not radiate, worse with movement, improved with lying flat. She has not had relief with the muscle relaxers or occasional APAP use. She has not been wearing the TLSO, she has not been to see her PCP yet. She denies any fever, N/V/D, dysuria, incontinence. No numbness or weakness in legs. She has home aids and son who help her at home. She has a cane, walker and wheelchair she can use at home.   Past Medical History:  Diagnosis Date  . Acute cholecystitis 12/29/2019  . Anemia   . Arthritis    "shoulders" (05/04/2015)  . Cellulitis of right lower extremity 11/27/2013  . CHF (congestive heart failure) (HCC)   . Chronic lower back pain   . Constipation   . COPD (chronic obstructive pulmonary disease) (HCC)   . GERD (gastroesophageal reflux disease)   . History of hiatal hernia   . HLD (hyperlipidemia)   . Hypertension   . Insomnia   . Osteoporosis   . Peripheral arterial disease (HCC)   . Restless leg syndrome   . Thyroid disease     Past Surgical History:  Procedure Laterality Date  . ABDOMINAL AORTAGRAM  05/04/2015   Procedure: Abdominal Aortagram;  Surgeon: Runell Gess, MD;  Location: [facility] INVASIVE CV LAB;  Service: Cardiovascular;;  . APPENDECTOMY    . CATARACT EXTRACTION W/ INTRAOCULAR LENS  IMPLANT, BILATERAL Bilateral   . CHOLECYSTECTOMY N/A 04/04/2020   Procedure: LAPAROSCOPIC CHOLECYSTECTOMY;  Surgeon: Violeta Gelinas, MD;  Location: [facility] OR;  Service:  General;  Laterality: N/A;  . I & D EXTREMITY Right 12/22/2016   Procedure: IRRIGATION AND DEBRIDEMENT EXTREMITY;  Surgeon: Mack Hook, MD;  Location: [facility] OR;  Service: Orthopedics;  Laterality: Right;  . IR EXCHANGE BILIARY DRAIN  03/13/2020  . IR PATIENT EVAL TECH 0-60 MINS  12/30/2019  . IR PERC CHOLECYSTOSTOMY  01/27/2020  . IR RADIOLOGIST EVAL & MGMT  05/16/2020  . PERIPHERAL VASCULAR CATHETERIZATION N/A 05/04/2015   Procedure: Lower Extremity Angiography;  Surgeon: Runell Gess, MD;  Location: [facility] INVASIVE CV LAB;  Service: Cardiovascular;  Laterality: N/A;  . PERIPHERAL VASCULAR CATHETERIZATION  05/04/2015   Procedure: Peripheral Vascular Intervention;  Surgeon: Runell Gess, MD;  Location: [facility] INVASIVE CV LAB;  Service: Cardiovascular;;  RCIA - 7x22 ICAST  . PERIPHERAL VASCULAR CATHETERIZATION Right 09/04/2015   Procedure: Peripheral Vascular Atherectomy;  Surgeon: Runell Gess, MD;  Location: [facility] INVASIVE CV LAB;  Service: Cardiovascular;  Laterality: Right;  SFA  . sfa Right 09/04/2015   de balloon  . THYROID SURGERY Right ?2013   "had goiter taken off my neck"  . TONSILLECTOMY    . VAGINAL HYSTERECTOMY      Family History  Problem Relation Age of Onset  . Heart disease Father   . Heart attack Father   . Heart disease Mother   . Coronary artery disease Other   . Atrial fibrillation Sister   .  Stroke Maternal Grandmother   . Cancer Paternal Grandmother     Social History   Tobacco Use  . Smoking status: Former Smoker    Packs/day: 0.50    Years: 60.00    Pack years: 30.00    Types: Cigarettes    Quit date: 04/02/2015    Years since quitting: 5.8  . Smokeless tobacco: Never Used  Vaping Use  . Vaping Use: Never used  Substance Use Topics  . Alcohol use: No  . Drug use: No     Home Medications Prior to Admission medications   Medication Sig Start Date End Date Taking? Authorizing Provider  HYDROcodone-acetaminophen (NORCO/VICODIN) 5-325 MG tablet Take  0.5-1 tablets by mouth every 6 (six) hours as needed for severe pain. 01/27/21  Yes Pollyann Savoy, MD  Albuterol Sulfate (PROAIR RESPICLICK) 108 (90 Base) MCG/ACT AEPB Inhale 2 puffs into the lungs every 6 (six) hours as needed (shortness of breath from COPD). 12/04/20   Shelva Majestic, MD  apixaban (ELIQUIS) 5 MG TABS tablet Take 1 tablet (5 mg total) by mouth 2 (two) times daily. Start 04/06/20 12/04/20   Shelva Majestic, MD  atorvastatin (LIPITOR) 40 MG tablet Take 1 tablet (40 mg total) by mouth daily. 12/04/20   Shelva Majestic, MD  Cyanocobalamin (VITAMIN B-12) 1000 MCG SUBL Place 1 tablet (1,000 mcg total) under the tongue once a week. Once weekly for 4 weeks. And then once monthly. 12/05/20   Shelva Majestic, MD  ELIQUIS 5 MG TABS tablet Take 5 mg by mouth 2 (two) times daily. 11/22/20   [provider]  ferrous sulfate 325 (65 FE) MG tablet Take 325 mg by mouth daily with breakfast.    [provider]  furosemide (LASIX) 40 MG tablet Take 1 tablet (40 mg total) by mouth 2 (two) times daily. 12/04/20   Shelva Majestic, MD  furosemide (LASIX) 40 MG tablet Take 40 mg by mouth 2 (two) times daily. 12/04/20   [provider]  irbesartan (AVAPRO) 300 MG tablet Take 1 tablet (300 mg total) by mouth daily. 12/04/20   Shelva Majestic, MD  irbesartan (AVAPRO) 300 MG tablet Take 300 mg by mouth daily. 12/04/20   [provider]  Multiple Vitamins-Minerals (PRESERVISION AREDS 2 PO) Take 1 tablet by mouth 2 (two) times daily.    [provider]  NEUPRO 3 MG/24HR PT24 Apply 1 patch topically daily. 12/04/20   [provider]  polyethylene glycol (MIRALAX / GLYCOLAX) 17 g packet Take 17 g by mouth 2 (two) times daily. Reported on 03/14/2016 Patient taking differently: Take 17 g by mouth daily as needed for mild constipation. 01/03/20   Hongalgi, Maximino Greenland, MD  potassium chloride SA (KLOR-CON) 20 MEQ tablet Take 1 tablet (20 mEq total) by mouth 2 (two) times  daily as needed. Needs to be taken  With each dose of lasix 12/08/20   Shelva Majestic, MD  potassium chloride SA (KLOR-CON) 20 MEQ tablet Take 20 mEq by mouth 2 (two) times daily. 12/09/20   [provider]  PROAIR RESPICLICK 108 (90 Base) MCG/ACT AEPB Inhale 2 puffs into the lungs every 6 (six) hours as needed for shortness of breath. 12/06/20   [provider]  rosuvastatin (CRESTOR) 40 MG tablet Take 1 tablet (40 mg total) by mouth daily. Take 1 tablet by mouth daily. 12/05/20   Shelva Majestic, MD  rosuvastatin (CRESTOR) 40 MG tablet Take 40 mg by mouth daily. 12/05/20  [provider]  Rotigotine (NEUPRO) 3 MG/24HR PT24 PLACE 1 PATCH (3 MG) ONTO THE SKIN AT BEDTIME 12/04/20   Shelva MajesticHunter, Stephen O, MD  traZODone (DESYREL) 50 MG tablet Take 1.5 tablets (75 mg total) by mouth at bedtime as needed for sleep. 12/04/20   Shelva MajesticHunter, Stephen O, MD  traZODone (DESYREL) 50 MG tablet Take 50 mg by mouth daily. 12/04/20   [provider]  vitamin B-12 (CYANOCOBALAMIN) 1000 MCG tablet Take 1,000 mcg by mouth daily.    [provider]     Allergies    Codeine and Codeine   Review of Systems   Review of Systems A comprehensive review of systems was completed and negative except as noted in HPI.    Physical Exam BP (!) 178/84   Pulse 69   Temp 97.7 F (36.5 C) (Oral)   Resp 18   Ht 5\' 6"  (1.676 m)   Wt 80 kg   LMP  (LMP Unknown)   SpO2 98%   BMI 28.47 kg/m   Physical Exam Vitals and nursing note reviewed.  Constitutional:      Appearance: Normal appearance.  HENT:     Head: Normocephalic and atraumatic.     Nose: Nose normal.     Mouth/Throat:     Mouth: Mucous membranes are moist.  Eyes:     Extraocular Movements: Extraocular movements intact.     Conjunctiva/sclera: Conjunctivae normal.  Cardiovascular:     Rate and Rhythm: Normal rate.  Pulmonary:     Effort: Pulmonary effort is normal.     Breath sounds: Normal breath sounds.  Abdominal:      General: Abdomen is flat.     Palpations: Abdomen is soft.     Tenderness: There is no abdominal tenderness.  Musculoskeletal:        General: Tenderness (R lumbar paraspinal muscles, no midline tenderness) present. No swelling. Normal range of motion.     Cervical back: Neck supple.  Skin:    General: Skin is warm and dry.  Neurological:     General: No focal deficit present.     Mental Status: She is alert.  Psychiatric:        Mood and Affect: Mood normal.      ED Results / Procedures / Treatments   Labs (all labs ordered are listed, but only abnormal results are displayed) Labs Reviewed - No data to display  EKG None  Radiology DG Lumbar Spine Complete  Result Date: 01/27/2021 CLINICAL DATA:  Fall 01/14/2021 with persistent back pain. EXAM: LUMBAR SPINE - COMPLETE 4+ VIEW COMPARISON:  01/14/2021 as well as CT 01/14/2021 FINDINGS: Exam demonstrates diffuse decreased bone mineralization. Mild spondylosis of the lumbar spine to include facet arthropathy. Subtle curvature of the lumbar spine convex right unchanged. There is interval slight vertebral body height loss of L2 likely evolving compression fracture. There is disc space narrowing at the L5-S1 level unchanged. Remainder the exam is unchanged. IMPRESSION: 1. Interval mild vertebral body height loss of L2 likely an evolving compression fracture. 2. Mild spondylosis of the lumbar spine with stable disc space narrowing at the L5-S1 level. Electronically Signed   By: Elberta Fortisaniel  Boyle M.D.   On: 01/27/2021 09:40    Procedures Procedures  Medications Ordered in the ED Medications  HYDROcodone-acetaminophen (NORCO/VICODIN) 5-325 MG per tablet 0.5 tablet (0.5 tablets Oral Given 01/27/21 0946)     MDM Rules/Calculators/A&P MDM Patient with persistent MSK pain after a fall. Will give a half dose of Norco  to see if this helps her pain without as severe side effects. Repeat Xray for signs of compression fx and reassess.  ED Course   I have reviewed the triage vital signs and the nursing notes.  Pertinent labs & imaging results that were available during my care of the patient were reviewed by me and considered in my medical decision making (see chart for details).  Clinical Course as of 01/27/21 1041  Sat Jan 27, 2021  2979 Xray indicates a possible evolving compression fracture at L2. May explain her continued pain.  [CS]  1035 Patient reports some improvement in pain after low dose norco. I discussed the xray findings with her. No indication for emergent kyphoplasty or admission to the hospital. She has chronic back pain, had previously been on Tramadol, but stopped due to side effects. She would like to go home. Will give short course of low dose norco for pain, recommend she wear TLSO for comfort and follow up with her back doctor next week for a recheck. She was advised to get help from her aid or family when getting up, especially while taking pain medications. RTED for any other concerns.  [CS]    Clinical Course User Index [CS] Pollyann Savoy, MD    Final Clinical Impression(s) / ED Diagnoses Final diagnoses:  Closed compression fracture of L2 lumbar vertebra, initial encounter [facility])    Rx / DC Orders ED Discharge Orders         Ordered    HYDROcodone-acetaminophen (NORCO/VICODIN) 5-325 MG tablet  Every 6 hours PRN        01/27/21 1040           Pollyann Savoy, MD 01/27/21 1041

## 2021-01-29 MED ORDER — ATORVASTATIN CALCIUM 40 MG PO TABS: 40.0000 mg | ORAL_TABLET | Freq: Every day | ORAL | 3 refills | Status: DC

## 2021-01-29 NOTE — Telephone Encounter (Signed)
Rx sent in

## 2021-01-30 ENCOUNTER — Telehealth: Payer: Self-pay

## 2021-01-30 NOTE — Telephone Encounter (Signed)
1019 am.  Phone call made to patient to schedule and in-person visit.  Patient is agreeable to Friday at 11 am.

## 2021-01-31 NOTE — Patient Instructions (Addendum)
Schedule your bone density test at check out desk. You may also call directly to X-ray at [phone] to schedule an appointment that is convenient for you.  - located [address] across the street from [place] - in the basement - you do need an appointment for the bone density tests.    Team please get her set up with reclast infusion- joellen can be a resource if needed- I dont see reclast/zolendronic acid as an option for [facility] center yet  Refilled pain medicine- call me at least 3 days before you are going to run out. This pain can last up to 3 months.  Try to use half tablet 4 times a day so we can space this out more-may need less as time goes on  Start calcium 1200mg  per day (if you drink a glass of milk which is 300mg  then you can reduce to 900mg  and factor in other foods to reduce oral calcium supplement  Start vitamin D 1000 units per day over the counter  Stop atorvastatin- should be on rosuvastatin 40 mg only  Please stop by x-ray before you go If you have mychart- we will send your results within 3 business days of receiving them.  If you do not have mychart- we will call you about results within 5 business days of receiving them.

## 2021-01-31 NOTE — Progress Notes (Signed)
Phone [phone] In person visit   Subjective:   Cheryl Atkinson is a 87 y.o. year old very pleasant female patient who presents for/with See problem oriented charting Chief Complaint  Patient presents with  . Atrial Fibrillation  . Hypertension  . Restless Leg   . Hyperlipidemia  . Depression  . Fall    2 weeks ago and has a compound fracture.     This visit occurred during the SARS-CoV-2 public health emergency.  Safety protocols were in place, including screening questions prior to the visit, additional usage of staff PPE, and extensive cleaning of exam room while observing appropriate contact time as indicated for disinfecting solutions.   Past Medical History-  Patient Active Problem List   Diagnosis Date Noted  . Pleural effusion due to CHF (congestive heart failure) (HCC) 05/26/2020    Priority: High  . Heart failure with preserved ejection fraction (HCC) 03/01/2020    Priority: High  . Bacteremia 01/28/2020    Priority: High  . Atrial fibrillation (HCC) 01/25/2020    Priority: High  . Memory loss 04/11/2016    Priority: High  . Renal artery stenosis (HCC) 09/27/2015    Priority: High  . Claudication (HCC) 05/04/2015    Priority: High  . Atherosclerotic PVD with intermittent claudication (HCC) 04/26/2015    Priority: High  . DNR (do not resuscitate) 09/29/2014    Priority: High  . Hyperglycemia 07/19/2014    Priority: High  . LOW BACK PAIN 09/25/2007    Priority: High  . Insomnia 03/01/2020    Priority: Medium  . Major depression 03/25/2018    Priority: Medium  . BPPV (benign paroxysmal positional vertigo) 07/12/2016    Priority: Medium  . Hyperlipidemia 06/30/2015    Priority: Medium  . Former smoker 09/29/2014    Priority: Medium  . COPD (chronic obstructive pulmonary disease) (HCC) 09/20/2009    Priority: Medium  . Iron deficiency anemia 11/09/2007    Priority: Medium  . RESTLESS LEG SYNDROME 09/25/2007    Priority: Medium  . Essential  hypertension 09/25/2007    Priority: Medium  . GERD 09/25/2007    Priority: Medium  . Osteoporosis 09/25/2007    Priority: Medium  . S/P laparoscopic cholecystectomy 04/04/2020    Priority: Low  . Abdominal aortic ectasia (HCC) 12/29/2019    Priority: Low  . Cat bite of right hand 12/21/2016    Priority: Low  . Mallet toe of right foot 10/20/2015    Priority: Low  . Tinnitus 12/31/2014    Priority: Low  . Multinodular goiter 08/30/2013    Priority: Low  . Spinal stenosis of lumbar region at multiple levels 09/29/2012    Priority: Low  . HIP PAIN, BILATERAL 07/16/2010    Priority: Low  . CONSTIPATION, CHRONIC 09/20/2009    Priority: Low  . History of UTI 11/09/2007    Priority: Low    Medications- reviewed and updated Current Outpatient Medications  Medication Sig Dispense Refill  . Albuterol Sulfate (PROAIR RESPICLICK) 108 (90 Base) MCG/ACT AEPB Inhale 2 puffs into the lungs every 6 (six) hours as needed (shortness of breath from COPD). 1 each 5  . apixaban (ELIQUIS) 5 MG TABS tablet Take 1 tablet (5 mg total) by mouth 2 (two) times daily. Start 04/06/20 180 tablet 3  . Cyanocobalamin (VITAMIN B-12) 1000 MCG SUBL Place 1 tablet (1,000 mcg total) under the tongue once a week. Once weekly for 4 weeks. And then once monthly. 4 tablet 3  . ferrous sulfate 325 (  65 FE) MG tablet Take 325 mg by mouth daily with breakfast.    . furosemide (LASIX) 40 MG tablet Take 1 tablet (40 mg total) by mouth 2 (two) times daily. 180 tablet 3  . irbesartan (AVAPRO) 300 MG tablet Take 1 tablet (300 mg total) by mouth daily. 90 tablet 3  . Multiple Vitamins-Minerals (PRESERVISION AREDS 2 PO) Take 1 tablet by mouth 2 (two) times daily.    . polyethylene glycol (MIRALAX / GLYCOLAX) 17 g packet Take 17 g by mouth 2 (two) times daily. Reported on 03/14/2016 (Patient taking differently: Take 17 g by mouth daily as needed for mild constipation.)    . potassium chloride SA (KLOR-CON) 20 MEQ tablet Take 1 tablet  (20 mEq total) by mouth 2 (two) times daily as needed. Needs to be taken  With each dose of lasix 180 tablet 3  . PROAIR RESPICLICK 108 (90 Base) MCG/ACT AEPB Inhale 2 puffs into the lungs every 6 (six) hours as needed for shortness of breath.    . rosuvastatin (CRESTOR) 40 MG tablet Take 1 tablet (40 mg total) by mouth daily. Take 1 tablet by mouth daily. 90 tablet 3  . Rotigotine (NEUPRO) 3 MG/24HR PT24 PLACE 1 PATCH (3 MG) ONTO THE SKIN AT BEDTIME 90 patch 3  . traZODone (DESYREL) 50 MG tablet Take 1.5 tablets (75 mg total) by mouth at bedtime as needed for sleep. 135 tablet 3  . vitamin B-12 (CYANOCOBALAMIN) 1000 MCG tablet Take 1,000 mcg by mouth daily.    Marland Kitchen HYDROcodone-acetaminophen (NORCO/VICODIN) 5-325 MG tablet Take 1 tablet by mouth every 6 (six) hours as needed for severe pain. 20 tablet 0   No current facility-administered medications for this visit.     Objective:  BP 128/80   Pulse 69   Temp 98.2 F (36.8 C) (Temporal)   Ht 5\' 6"  (1.676 m)   Wt 144 lb (65.3 kg)   LMP  (LMP Unknown)   SpO2 95%   BMI 23.24 kg/m  Gen: NAD, resting comfortably CV: RRR (not obviously in a fib at moment) no murmurs rubs or gallops Lungs: CTAB no crackles, wheeze, rhonchi.  Ext: no edema Skin: warm, dry MSK: Pain over L2 lumbar spine.  Also has pain with palpation of distal portion of metatarsal left foot    Assessment and Plan   #L2 compression fracture #osteoporosis  S: Patient with a fall on January 14, 2021 and presented to the emergency room.  She had a laceration of her head as well as low back pain-no fracture on initial CT scan at that time.  No loss of consciousness.  Covid and flu negative.  No alcohol was detected.  CT of the head was ordered due to Eliquis.  Oral pain medications made patient feel woozy and drowsy on hydrocodone-acetaminophen 5-325 mg if it was difficult to control patient's pain.  She was given a TLSO.  Her Tdap was updated. -fall happened with walker out to  side of her- she was trying to get shoes on and fell backwards  She returned emergency room on January 27, 2021 complaining of right lumbar back pain without radiation worse with movement and improved with lying flat.  Muscle relaxers and Tylenol have not been very effective.  She had not been wearing her TLSO and had not seen me in the office.  She does have a cane, walker and wheelchair she can use at home.  In the emergency room she was given a half a dose of  Norco.  X-ray showed possible evolving compression fracture at L2.  She was given a course of Norco for pain.  She is recommended to use her TLSO.  She was recommended follow-up with her back doctor who has seen her for chronic pain in the past.  Patient does have known osteoporosis but had abdominal pain on Fosamax.  She has declined Reclast previously.  We discussed this option again today.  She is taking half pill every 6 hours of hydrocodone  A/P: Patient with L2 compression fracture.  We will get updated bone density-she has history of osteoporosis and previously declined Reclast as she is open to this now and we will go ahead and order this.  She will start calcium and vitamin D as per after visit summary. -Refilled hydrocodone-I would prefer for her use 1/2 tablet every 6 hours-has been at least partially managing pain without causing side effects thankfully -Can take up to 3 months to heal but hopefully improving by 6-week interval-you prefer to avoid kyphoplasty if possible  #hyperlipidemia S: Medication: Appears may be on both atorvastatin 40 mg or rosuvastatin 40 mg-we discussed today should only be on 1 of these Lab Results  Component Value Date   CHOL 169 12/04/2020   HDL 80.50 12/04/2020   LDLCALC 78 12/04/2020   LDLDIRECT 55.0 03/15/2020   TRIG 54.0 12/04/2020   CHOLHDL 2 12/04/2020   A/P: LDL was above goal last visit-update with next labs-continue rosuvastatin 40 mg alone-stop atorvastatin  #left foot pain- has  been bothering her since she started walking back after fracture- really bothers her. Will get x-ray with osteoporosis to rule out fracture in 5th metatarsal -If pain not improving and x-ray unrevealing consider sports medicine consult at next visit  #interestingly swelling better since injury with CHF history - has not been swelling/having to use lasix  Recommended follow up: Return in about 3 weeks (around 02/26/2021). Future Appointments  Date Time Provider Department Center  02/06/2021  7:45 AM Sherrie George, MD [facility] None  07/20/2021  9:30 AM [facility] HEALTH COACH [facility]    Lab/Order associations:   ICD-10-CM   1. Age-related osteoporosis with current pathol fracture of vertebra, initial encounter (HCC)  M80.08XA DG Bone Density  2. Left foot pain  M79.672 DG Foot Complete Left  3. Compression fracture of L2 vertebra, initial encounter (HCC)  S32.020A     Meds ordered this encounter  Medications  . HYDROcodone-acetaminophen (NORCO/VICODIN) 5-325 MG tablet    Sig: Take 1 tablet by mouth every 6 (six) hours as needed for severe pain.    Dispense:  20 tablet    Refill:  0    Return precautions advised.  Tana Conch, MD

## 2021-02-05 ENCOUNTER — Ambulatory Visit (INDEPENDENT_AMBULATORY_CARE_PROVIDER_SITE_OTHER): Payer: Medicare Other

## 2021-02-05 ENCOUNTER — Other Ambulatory Visit: Payer: Self-pay

## 2021-02-05 ENCOUNTER — Ambulatory Visit (INDEPENDENT_AMBULATORY_CARE_PROVIDER_SITE_OTHER): Payer: Medicare Other | Admitting: Family Medicine

## 2021-02-05 ENCOUNTER — Encounter: Payer: Self-pay | Admitting: Family Medicine

## 2021-02-05 VITALS — BP 128/80 | HR 69 | Temp 98.2°F | Ht 66.0 in | Wt 144.0 lb

## 2021-02-05 DIAGNOSIS — I701 Atherosclerosis of renal artery: Secondary | ICD-10-CM

## 2021-02-05 DIAGNOSIS — S32020A Wedge compression fracture of second lumbar vertebra, initial encounter for closed fracture: Secondary | ICD-10-CM

## 2021-02-05 DIAGNOSIS — S32020D Wedge compression fracture of second lumbar vertebra, subsequent encounter for fracture with routine healing: Secondary | ICD-10-CM

## 2021-02-05 DIAGNOSIS — M8008XA Age-related osteoporosis with current pathological fracture, vertebra(e), initial encounter for fracture: Secondary | ICD-10-CM

## 2021-02-05 DIAGNOSIS — M79672 Pain in left foot: Secondary | ICD-10-CM

## 2021-02-05 DIAGNOSIS — M79675 Pain in left toe(s): Secondary | ICD-10-CM | POA: Diagnosis not present

## 2021-02-05 DIAGNOSIS — M85872 Other specified disorders of bone density and structure, left ankle and foot: Secondary | ICD-10-CM | POA: Diagnosis not present

## 2021-02-05 MED ORDER — HYDROCODONE-ACETAMINOPHEN 5-325 MG PO TABS
1.0000 | ORAL_TABLET | Freq: Four times a day (QID) | ORAL | 0 refills | Status: DC | PRN
Start: 2021-02-05 — End: 2021-02-12

## 2021-02-06 ENCOUNTER — Encounter (INDEPENDENT_AMBULATORY_CARE_PROVIDER_SITE_OTHER): Payer: Medicare Other | Admitting: Ophthalmology

## 2021-02-06 DIAGNOSIS — H353112 Nonexudative age-related macular degeneration, right eye, intermediate dry stage: Secondary | ICD-10-CM

## 2021-02-06 DIAGNOSIS — H43813 Vitreous degeneration, bilateral: Secondary | ICD-10-CM | POA: Diagnosis not present

## 2021-02-06 DIAGNOSIS — H35033 Hypertensive retinopathy, bilateral: Secondary | ICD-10-CM | POA: Diagnosis not present

## 2021-02-06 DIAGNOSIS — I10 Essential (primary) hypertension: Secondary | ICD-10-CM | POA: Diagnosis not present

## 2021-02-06 DIAGNOSIS — H353221 Exudative age-related macular degeneration, left eye, with active choroidal neovascularization: Secondary | ICD-10-CM | POA: Diagnosis not present

## 2021-02-08 ENCOUNTER — Other Ambulatory Visit: Payer: Medicare Other

## 2021-02-08 ENCOUNTER — Other Ambulatory Visit: Payer: Self-pay

## 2021-02-08 VITALS — BP 112/58 | HR 79 | Temp 97.5°F | Resp 18

## 2021-02-08 DIAGNOSIS — Z515 Encounter for palliative care: Secondary | ICD-10-CM

## 2021-02-08 NOTE — Progress Notes (Signed)
PATIENT NAME: Cheryl Atkinson DOB: March 27, 1931 MRN: [id number]  PRIMARY CARE PROVIDER: Marin Olp, MD  RESPONSIBLE PARTY:  Acct ID - Guarantor Home Phone Work Phone Relationship Acct Type  [id number] MIKELLE, [phone]  Self P/F     [address], [place], [place] [address]    PLAN OF CARE and INTERVENTIONS:               1.  GOALS OF CARE/ ADVANCE CARE PLANNING:  Remain home and independent with the assistance of a private caregiver and her son.               2.  PATIENT/CAREGIVER EDUCATION:  Pain management and constipation.               4. PERSONAL EMERGENCY PLAN:  Activate 911 for emergencies.               5.  DISEASE STATUS:   Joint visit completed with patient and her caregiver [name].  Patient provides an update on her fall and subsequent ED visits. Patient states she fell backwards and hit her head in the bedroom.  She is doing much better with pain management.    At this time, she is taking hydrocodone-acetaminophen 5/325 mg 1/2 tab every 3 hours as needed.  Constipation became and issues with the start of pain med.  Patient was given 2 enemas by her caregiver.  She is now taking a stool softner daily and miralax at bedtime. Patient is using a rolling walker and has standby assistance.  Patient has a new life alert in place.  Her previous alert was not working correctly.  Patient states she is not taking her blood pressure or weights since her fall.  She is working to restart this with the assistance of her caregiver.  Patient's appetite continues to be good.  She is currently on a low sodium diet.  Patient is eating 3 meals a day.  Her family is providing homemade meals during the week.  Patient denies an nausea/vomitting, shortness of breath, chest pain, headaches or dizziness.  Other than her fall last month, she feels she has been doing well overall.  Patient has a growth on the right side of her face.  She is planning on seeing dermatology to have this area  removed.  I have advised that it maybe 3-6 months before she could be seen.      HISTORY OF PRESENT ILLNESS:  87 year old female with recent fall resulting in a L2 compression fracture.  Patient is being followed by Palliative Care monthly and PRN.  CODE STATUS: DNR ADVANCED DIRECTIVES: N MOST FORM: Yes PPS: 50%   PHYSICAL EXAM:   VITALS: Today's Vitals   02/08/21 1255  BP: (!) 112/58  Pulse: 79  Resp: 18  Temp: (!) 97.5 F (36.4 C)  SpO2: 95%  PainSc: 0-No pain    LUNGS: CTA CARDIAC: HRR EXTREMITIES: - for edema SKIN: lesion on the left side of face. NEURO: alert and oriented x 4       Lorenza Burton, RN

## 2021-02-12 ENCOUNTER — Other Ambulatory Visit: Payer: Self-pay

## 2021-02-12 ENCOUNTER — Telehealth: Payer: Self-pay

## 2021-02-12 MED ORDER — HYDROCODONE-ACETAMINOPHEN 5-325 MG PO TABS: 1.0000 | ORAL_TABLET | Freq: Four times a day (QID) | ORAL | 0 refills | Status: DC | PRN

## 2021-02-12 NOTE — Telephone Encounter (Signed)
Refill has been sent to Dr Durene Cal for confirmation.

## 2021-02-12 NOTE — Telephone Encounter (Signed)
  LAST APPOINTMENT DATE: 02/05/2021   NEXT APPOINTMENT DATE:@3 /29/2022  MEDICATION:HYDROcodone-acetaminophen (NORCO/VICODIN) 5-325 MG tablet  PHARMACY:[id number] - [place], [place] - [address] AT [facility] OF [address]  Please advise

## 2021-02-13 ENCOUNTER — Other Ambulatory Visit: Payer: Medicare Other

## 2021-02-19 ENCOUNTER — Other Ambulatory Visit: Payer: Self-pay

## 2021-02-19 MED ORDER — HYDROCODONE-ACETAMINOPHEN 5-325 MG PO TABS: 1.0000 | ORAL_TABLET | Freq: Four times a day (QID) | ORAL | 0 refills | Status: DC | PRN

## 2021-02-19 NOTE — Telephone Encounter (Signed)
MEDICATION: hydrocodone325 MG  PHARMACY: [facility] [address]  Comments:   **Let patient know to contact pharmacy at the end of the day to make sure medication is ready. **  ** Please notify patient to allow 48-72 hours to process**  **Encourage patient to contact the pharmacy for refills or they can request refills through [facility]**

## 2021-02-20 ENCOUNTER — Other Ambulatory Visit: Payer: Self-pay

## 2021-02-20 ENCOUNTER — Ambulatory Visit (INDEPENDENT_AMBULATORY_CARE_PROVIDER_SITE_OTHER)
Admission: RE | Admit: 2021-02-20 | Discharge: 2021-02-20 | Disposition: A | Discharge status: Home / Self Care | Payer: Medicare Other | Source: Ambulatory Visit | Attending: Family Medicine | Admitting: Family Medicine

## 2021-02-20 DIAGNOSIS — M8008XA Age-related osteoporosis with current pathological fracture, vertebra(e), initial encounter for fracture: Secondary | ICD-10-CM | POA: Diagnosis not present

## 2021-02-21 DIAGNOSIS — H524 Presbyopia: Secondary | ICD-10-CM | POA: Diagnosis not present

## 2021-02-21 DIAGNOSIS — H5203 Hypermetropia, bilateral: Secondary | ICD-10-CM | POA: Diagnosis not present

## 2021-02-21 DIAGNOSIS — H52223 Regular astigmatism, bilateral: Secondary | ICD-10-CM | POA: Diagnosis not present

## 2021-02-21 DIAGNOSIS — Z961 Presence of intraocular lens: Secondary | ICD-10-CM | POA: Diagnosis not present

## 2021-02-21 DIAGNOSIS — H353221 Exudative age-related macular degeneration, left eye, with active choroidal neovascularization: Secondary | ICD-10-CM | POA: Diagnosis not present

## 2021-02-23 DIAGNOSIS — Z7901 Long term (current) use of anticoagulants: Secondary | ICD-10-CM | POA: Diagnosis not present

## 2021-02-23 DIAGNOSIS — I10 Essential (primary) hypertension: Secondary | ICD-10-CM | POA: Diagnosis not present

## 2021-02-23 DIAGNOSIS — Z9181 History of falling: Secondary | ICD-10-CM | POA: Diagnosis not present

## 2021-02-23 DIAGNOSIS — K5909 Other constipation: Secondary | ICD-10-CM | POA: Diagnosis not present

## 2021-02-23 DIAGNOSIS — M549 Dorsalgia, unspecified: Secondary | ICD-10-CM | POA: Diagnosis not present

## 2021-02-23 DIAGNOSIS — G8929 Other chronic pain: Secondary | ICD-10-CM | POA: Diagnosis not present

## 2021-02-26 NOTE — Patient Instructions (Addendum)
Also with osteoporosis. She should be on 1200mg  of calcium per day (combo of diet and supplement- team please give her a handout of calcium options in diet) and at least 800 units of vitamin D- 1000 or 2000 is fine as well -team please also update her on reclast infusion plans before she leaves- we need to get this done ASAP  Call if any issues on hydrocodone refill  Recommended follow up: 3-4 week follow up

## 2021-02-26 NOTE — Progress Notes (Signed)
Phone [phone] In person visit   Subjective:   Cheryl Atkinson is a 87 y.o. year old very pleasant female patient who presents for/with See problem oriented charting Chief Complaint  Patient presents with  . Compression Fracture    Follow up on her L2 Compression fracture   . Osteoporosis    Age related. Follow up   . Medication Refill    Hydrocodone refill for her pain.   This visit occurred during the SARS-CoV-2 public health emergency.  Safety protocols were in place, including screening questions prior to the visit, additional usage of staff PPE, and extensive cleaning of exam room while observing appropriate contact time as indicated for disinfecting solutions.   Past Medical History-  Patient Active Problem List   Diagnosis Date Noted  . Pleural effusion due to CHF (congestive heart failure) (HCC) 05/26/2020    Priority: High  . Heart failure with preserved ejection fraction (HCC) 03/01/2020    Priority: High  . Bacteremia 01/28/2020    Priority: High  . Atrial fibrillation (HCC) 01/25/2020    Priority: High  . Memory loss 04/11/2016    Priority: High  . Renal artery stenosis (HCC) 09/27/2015    Priority: High  . Claudication (HCC) 05/04/2015    Priority: High  . Atherosclerotic PVD with intermittent claudication (HCC) 04/26/2015    Priority: High  . DNR (do not resuscitate) 09/29/2014    Priority: High  . Hyperglycemia 07/19/2014    Priority: High  . LOW BACK PAIN 09/25/2007    Priority: High  . Insomnia 03/01/2020    Priority: Medium  . Major depression 03/25/2018    Priority: Medium  . BPPV (benign paroxysmal positional vertigo) 07/12/2016    Priority: Medium  . Hyperlipidemia 06/30/2015    Priority: Medium  . Former smoker 09/29/2014    Priority: Medium  . COPD (chronic obstructive pulmonary disease) (HCC) 09/20/2009    Priority: Medium  . Iron deficiency anemia 11/09/2007    Priority: Medium  . RESTLESS LEG SYNDROME 09/25/2007    Priority:  Medium  . Essential hypertension 09/25/2007    Priority: Medium  . GERD 09/25/2007    Priority: Medium  . Osteoporosis 09/25/2007    Priority: Medium  . S/P laparoscopic cholecystectomy 04/04/2020    Priority: Low  . Abdominal aortic ectasia (HCC) 12/29/2019    Priority: Low  . Cat bite of right hand 12/21/2016    Priority: Low  . Mallet toe of right foot 10/20/2015    Priority: Low  . Tinnitus 12/31/2014    Priority: Low  . Multinodular goiter 08/30/2013    Priority: Low  . Spinal stenosis of lumbar region at multiple levels 09/29/2012    Priority: Low  . HIP PAIN, BILATERAL 07/16/2010    Priority: Low  . CONSTIPATION, CHRONIC 09/20/2009    Priority: Low  . History of UTI 11/09/2007    Priority: Low    Medications- reviewed and updated Current Outpatient Medications  Medication Sig Dispense Refill  . Albuterol Sulfate (PROAIR RESPICLICK) 108 (90 Base) MCG/ACT AEPB Inhale 2 puffs into the lungs every 6 (six) hours as needed (shortness of breath from COPD). 1 each 5  . apixaban (ELIQUIS) 5 MG TABS tablet Take 1 tablet (5 mg total) by mouth 2 (two) times daily. Start 04/06/20 180 tablet 3  . CALCIUM PO Take by mouth.    . Cholecalciferol (VITAMIN D3 PO) Take by mouth.    . ferrous sulfate 325 (65 FE) MG tablet Take 325 mg by  mouth 2 (two) times a week. Patient takes one tablet on Monday and Friday    . furosemide (LASIX) 40 MG tablet Take 1 tablet (40 mg total) by mouth 2 (two) times daily. 180 tablet 3  . HYDROcodone-acetaminophen (NORCO/VICODIN) 5-325 MG tablet Take 1 tablet by mouth every 6 (six) hours as needed for severe pain. 20 tablet 0  . irbesartan (AVAPRO) 300 MG tablet Take 1 tablet (300 mg total) by mouth daily. 90 tablet 3  . LINZESS 145 MCG CAPS capsule Take 145 mcg by mouth every morning.    . Multiple Vitamins-Minerals (PRESERVISION AREDS 2 PO) Take 1 tablet by mouth 2 (two) times daily.    . polyethylene glycol (MIRALAX / GLYCOLAX) 17 g packet Take 17 g by mouth  2 (two) times daily. Reported on 03/14/2016 (Patient taking differently: Take 17 g by mouth daily as needed for mild constipation.)    . potassium chloride SA (KLOR-CON) 20 MEQ tablet Take 1 tablet (20 mEq total) by mouth 2 (two) times daily as needed. Needs to be taken  With each dose of lasix 180 tablet 3  . PROAIR RESPICLICK 108 (90 Base) MCG/ACT AEPB Inhale 2 puffs into the lungs every 6 (six) hours as needed for shortness of breath.    . rosuvastatin (CRESTOR) 40 MG tablet Take 1 tablet (40 mg total) by mouth daily. Take 1 tablet by mouth daily. 90 tablet 3  . Rotigotine (NEUPRO) 3 MG/24HR PT24 PLACE 1 PATCH (3 MG) ONTO THE SKIN AT BEDTIME 90 patch 3  . traZODone (DESYREL) 50 MG tablet Take 1.5 tablets (75 mg total) by mouth at bedtime as needed for sleep. 135 tablet 3  . vitamin B-12 (CYANOCOBALAMIN) 1000 MCG tablet Take 1,000 mcg by mouth daily.     No current facility-administered medications for this visit.     Objective:  BP (!) 156/82   Pulse 94   Temp 97.6 F (36.4 C) (Temporal)   Ht 5\' 6"  (1.676 m)   Wt 136 lb 12.8 oz (62.1 kg)   LMP  (LMP Unknown)   SpO2 94%   BMI 22.08 kg/m  Gen: NAD, resting comfortably CV: RRR no murmurs rubs or gallops Lungs: CTAB no crackles, wheeze, rhonchi Ext: no edema Skin: warm, dry Neuro: wheelchair bound Msk: no pain     Assessment and Plan   #L2 compression fracture/osteoporosis S: Patient with fall January 14, 2021-initially presented to emergency room but L2 compression fracture not described until repeat emergency room trip January 27, 2021.  Was recommended that she use TLSO and follow-up with her orthopedist  Patient with osteoporosis history of abdominal pain on Fosamax and had declined Reclast previously.  Unfortunately patient had significant worsening of bone density on DEXA 02/20/2021-team is trying to get her set up with Reclast infusion  She is on hydrocodone-acetaminophen- doing half tablet for most part usually 3x a  day- if gets really bad will take the other half. In some pain right now- did take a dose before she came in- but a lot more movement today to get to appointment. Gradual improvement in pain- no more screaming.  A/P: L2 compression fracture likely slowly healing. Will continue pain management.  Sent in rx on 02/19/21 and from [facility] she has not picked this up yet- she will pick this up today and we can chat with pharmacy if any issues  Also with osteoporosis. She should be on 1200mg  of calcium per day (combo of diet and supplement- team please give her  a handout of calcium options in diet) and at least 800 units of vitamin D- 1000 or 2000 is fine as well  # Atrial fibrillation- new onset 2021 S: Rate controlled without medication  Anticoagulated with eliquis 5 mg BID Patient is  followed by cardiology: Dr. Allyson Sabal  A/P: Stable. Continue current medications.    #CHF with preserved EF  #Hypertension S: Compliant with irbesartan 300 mg (ok per cards as only unilateral renal artery stenosis) and lasix 40mg  . Somehow missed BP meds for a few days but restarted in last few days as BP apprently got above 190.  -off hctz since starting lasix -off amlodiine march 2021 with edema issues .  Blood pressure high in past with caffeine intake BP Readings from Last 3 Encounters:  02/27/21 (!) 156/82  02/08/21 (!) 112/58  02/05/21 128/80  A/P: blood pressure slightly high but had been off meds (plus being in pain could cause issues with BP elevation)- seems to be trending down- will continue to monitor and recheck again in 3-4 weeks.   Continue current meds for now  No signs of fluid overload at present but we discussed lasix also helps with BP control  - kidney function worsened some in hospital- we will repeat today to make sure safe for reclast (need cr cl >35)    #Hyperlipidemia/peripheral vascular disease/renal artery stenosis S: Compliant with atorvastatin 40 mg-->rosuvastatin 40mg  with LDL above 70 in  early 2022 and also on Plavix per Dr. .  She has known atherosclerotic peripheral vascular disease with intermittent claudication-stable with claudication at approximately 200 feet.   -Last visit patient was on both atorvastatin and rosuvastatin-she was to stop the atorvastatin which she did   Patient with right renal artery stenosis on angiogram June 2016.  She is on Plavix and statin. A/P: lipids likely in better position- continue rosuvastatin 40mg  alone. For PVD/renal artery stenosis- continue eliquis alone as well- ok per cards    # B12 deficiency S: Current treatment/medication (oral vs. IM): oral- had been on IM  Lab Results  Component Value Date   VITAMINB12 108 (L) 12/04/2020  A/P: hopefully improved- update levels today- if not at goal will need injections longer   Recommended follow up: 3-4 weeks follow up  Future Appointments  Date Time Provider Department Center  03/06/2021  8:00 AM , MD [facility] None  07/20/2021  9:30 AM [facility] HEALTH COACH [facility]    Lab/Order associations:   ICD-10-CM   1. Age-related osteoporosis with current pathological fracture, initial encounter  M80.00XA   2. Mixed hyperlipidemia  E78.2   3. Essential hypertension  I10   4. Longstanding persistent atrial fibrillation (HCC)  I48.11    Return precautions advised.  05/06/2021, MD

## 2021-02-27 ENCOUNTER — Encounter: Payer: Self-pay | Admitting: Family Medicine

## 2021-02-27 ENCOUNTER — Ambulatory Visit (INDEPENDENT_AMBULATORY_CARE_PROVIDER_SITE_OTHER): Payer: Medicare Other | Admitting: Family Medicine

## 2021-02-27 ENCOUNTER — Other Ambulatory Visit: Payer: Self-pay

## 2021-02-27 VITALS — BP 156/82 | HR 94 | Temp 97.6°F | Ht 66.0 in | Wt 136.8 lb

## 2021-02-27 DIAGNOSIS — I4811 Longstanding persistent atrial fibrillation: Secondary | ICD-10-CM | POA: Diagnosis not present

## 2021-02-27 DIAGNOSIS — M8000XA Age-related osteoporosis with current pathological fracture, unspecified site, initial encounter for fracture: Secondary | ICD-10-CM

## 2021-02-27 DIAGNOSIS — I10 Essential (primary) hypertension: Secondary | ICD-10-CM | POA: Diagnosis not present

## 2021-02-27 DIAGNOSIS — E538 Deficiency of other specified B group vitamins: Secondary | ICD-10-CM | POA: Diagnosis not present

## 2021-02-27 DIAGNOSIS — E782 Mixed hyperlipidemia: Secondary | ICD-10-CM | POA: Diagnosis not present

## 2021-02-27 DIAGNOSIS — I701 Atherosclerosis of renal artery: Secondary | ICD-10-CM | POA: Diagnosis not present

## 2021-02-27 LAB — COMPREHENSIVE METABOLIC PANEL
ALT: 10 U/L (ref 0–35)
AST: 16 U/L (ref 0–37)
Albumin: 3.8 g/dL (ref 3.5–5.2)
Alkaline Phosphatase: 151 U/L — ABNORMAL HIGH (ref 39–117)
BUN: 22 mg/dL (ref 6–23)
CO2: 31 mEq/L (ref 19–32)
Calcium: 9.1 mg/dL (ref 8.4–10.5)
Chloride: 100 mEq/L (ref 96–112)
Creatinine, Ser: 0.71 mg/dL (ref 0.40–1.20)
GFR: 76.4 mL/min (ref >60.00)
Glucose, Bld: 107 mg/dL — ABNORMAL HIGH (ref 70–99)
Potassium: 3.4 mEq/L — ABNORMAL LOW (ref 3.5–5.1)
Sodium: 140 mEq/L (ref 135–145)
Total Bilirubin: 0.7 mg/dL (ref 0.2–1.2)
Total Protein: 7.4 g/dL (ref 6.0–8.3)

## 2021-02-27 LAB — CBC WITH DIFFERENTIAL/PLATELET
Basophils Absolute: 0.1 10*3/uL (ref 0.0–0.1)
Basophils Relative: 0.9 % (ref 0.0–3.0)
Eosinophils Absolute: 0.1 10*3/uL (ref 0.0–0.7)
Eosinophils Relative: 1.9 % (ref 0.0–5.0)
HCT: 34 % — ABNORMAL LOW (ref 36.0–46.0)
Hemoglobin: 11.3 g/dL — ABNORMAL LOW (ref 12.0–15.0)
Lymphocytes Relative: 23.4 % (ref 12.0–46.0)
Lymphs Abs: 1.7 10*3/uL (ref 0.7–4.0)
MCHC: 33.2 g/dL (ref 30.0–36.0)
MCV: 87.1 fl (ref 78.0–100.0)
Monocytes Absolute: 0.6 10*3/uL (ref 0.1–1.0)
Monocytes Relative: 7.9 % (ref 3.0–12.0)
Neutro Abs: 4.7 10*3/uL (ref 1.4–7.7)
Neutrophils Relative %: 65.9 % (ref 43.0–77.0)
Platelets: 243 10*3/uL (ref 150.0–400.0)
RBC: 3.91 Mil/uL (ref 3.87–5.11)
RDW: 15 % (ref 11.5–15.5)
WBC: 7.1 10*3/uL (ref 4.0–10.5)

## 2021-02-27 LAB — VITAMIN B12: Vitamin B-12: 524 pg/mL (ref 211–911)

## 2021-02-28 DIAGNOSIS — Z7901 Long term (current) use of anticoagulants: Secondary | ICD-10-CM | POA: Diagnosis not present

## 2021-02-28 DIAGNOSIS — G8929 Other chronic pain: Secondary | ICD-10-CM | POA: Diagnosis not present

## 2021-02-28 DIAGNOSIS — K5909 Other constipation: Secondary | ICD-10-CM | POA: Diagnosis not present

## 2021-02-28 DIAGNOSIS — M549 Dorsalgia, unspecified: Secondary | ICD-10-CM | POA: Diagnosis not present

## 2021-02-28 DIAGNOSIS — I10 Essential (primary) hypertension: Secondary | ICD-10-CM | POA: Diagnosis not present

## 2021-02-28 DIAGNOSIS — Z9181 History of falling: Secondary | ICD-10-CM | POA: Diagnosis not present

## 2021-03-02 DIAGNOSIS — M549 Dorsalgia, unspecified: Secondary | ICD-10-CM | POA: Diagnosis not present

## 2021-03-02 DIAGNOSIS — Z7901 Long term (current) use of anticoagulants: Secondary | ICD-10-CM | POA: Diagnosis not present

## 2021-03-02 DIAGNOSIS — G8929 Other chronic pain: Secondary | ICD-10-CM | POA: Diagnosis not present

## 2021-03-02 DIAGNOSIS — Z9181 History of falling: Secondary | ICD-10-CM | POA: Diagnosis not present

## 2021-03-02 DIAGNOSIS — K5909 Other constipation: Secondary | ICD-10-CM | POA: Diagnosis not present

## 2021-03-02 DIAGNOSIS — I10 Essential (primary) hypertension: Secondary | ICD-10-CM | POA: Diagnosis not present

## 2021-03-06 ENCOUNTER — Other Ambulatory Visit: Payer: Self-pay

## 2021-03-06 ENCOUNTER — Encounter (INDEPENDENT_AMBULATORY_CARE_PROVIDER_SITE_OTHER): Payer: Medicare Other | Admitting: Ophthalmology

## 2021-03-06 DIAGNOSIS — H353112 Nonexudative age-related macular degeneration, right eye, intermediate dry stage: Secondary | ICD-10-CM

## 2021-03-06 DIAGNOSIS — I10 Essential (primary) hypertension: Secondary | ICD-10-CM

## 2021-03-06 DIAGNOSIS — H353221 Exudative age-related macular degeneration, left eye, with active choroidal neovascularization: Secondary | ICD-10-CM | POA: Diagnosis not present

## 2021-03-06 DIAGNOSIS — H35033 Hypertensive retinopathy, bilateral: Secondary | ICD-10-CM | POA: Diagnosis not present

## 2021-03-06 DIAGNOSIS — H43813 Vitreous degeneration, bilateral: Secondary | ICD-10-CM

## 2021-03-08 DIAGNOSIS — M549 Dorsalgia, unspecified: Secondary | ICD-10-CM | POA: Diagnosis not present

## 2021-03-08 DIAGNOSIS — Z9181 History of falling: Secondary | ICD-10-CM | POA: Diagnosis not present

## 2021-03-08 DIAGNOSIS — G8929 Other chronic pain: Secondary | ICD-10-CM | POA: Diagnosis not present

## 2021-03-08 DIAGNOSIS — I10 Essential (primary) hypertension: Secondary | ICD-10-CM | POA: Diagnosis not present

## 2021-03-08 DIAGNOSIS — Z7901 Long term (current) use of anticoagulants: Secondary | ICD-10-CM | POA: Diagnosis not present

## 2021-03-08 DIAGNOSIS — K5909 Other constipation: Secondary | ICD-10-CM | POA: Diagnosis not present

## 2021-03-09 ENCOUNTER — Telehealth: Payer: Self-pay

## 2021-03-09 ENCOUNTER — Other Ambulatory Visit: Payer: Self-pay

## 2021-03-09 MED ORDER — HYDROCODONE-ACETAMINOPHEN 5-325 MG PO TABS: 1.0000 | ORAL_TABLET | Freq: Four times a day (QID) | ORAL | 0 refills | Status: DC | PRN

## 2021-03-09 NOTE — Addendum Note (Signed)
Addended by: Shelva Majestic on: 03/09/2021 12:01 PM   Modules accepted: Orders

## 2021-03-09 NOTE — Telephone Encounter (Signed)
Last refill 02/19/2021.

## 2021-03-09 NOTE — Telephone Encounter (Signed)
..   LAST APPOINTMENT DATE: 02/27/2021   NEXT APPOINTMENT DATE:@5 /03/2021  MEDICATION:HYDROcodone-acetaminophen (NORCO/VICODIN) 5-325 MG tablet    PHARMACY:[id number] - [place], [place] - [address] AT [facility] OF [address]

## 2021-03-09 NOTE — Telephone Encounter (Signed)
Patient is aware that her medication has been sent in and she is aware of Dr. Pamala Hurry comments. Gave a verbal understanding.

## 2021-03-09 NOTE — Telephone Encounter (Signed)
I refilled this-1 patient to try to stretch this out longer than this refill

## 2021-03-14 DIAGNOSIS — M549 Dorsalgia, unspecified: Secondary | ICD-10-CM | POA: Diagnosis not present

## 2021-03-14 DIAGNOSIS — I10 Essential (primary) hypertension: Secondary | ICD-10-CM | POA: Diagnosis not present

## 2021-03-14 DIAGNOSIS — Z7901 Long term (current) use of anticoagulants: Secondary | ICD-10-CM | POA: Diagnosis not present

## 2021-03-14 DIAGNOSIS — Z9181 History of falling: Secondary | ICD-10-CM | POA: Diagnosis not present

## 2021-03-14 DIAGNOSIS — G8929 Other chronic pain: Secondary | ICD-10-CM | POA: Diagnosis not present

## 2021-03-14 DIAGNOSIS — K5909 Other constipation: Secondary | ICD-10-CM | POA: Diagnosis not present

## 2021-03-20 ENCOUNTER — Telehealth: Payer: Self-pay

## 2021-03-20 NOTE — Telephone Encounter (Signed)
11:02AM: Palliative care SW outreached patient to complete telephonic visit.   Palliative care SW outreached patient to complete telephonic visit. Patient provided update on medical condition and/or changes. Patient shared that she has been doing pretty good since last in home visit. Patient stated that due to the rainy weather yesterday she has been experiencing back pain, patient states she takes hydrocodone for pain and has felt much relief. Patient denies any other pains. No recent falls, she uses her RW for mobility assistance and has a caregiver for standby supervision if needed.   Patients shares that her appetite has been fair. She states that although she eats three meals a day, she is noticing a weight loss. Patient has not weighed herself recently. Patient states that she drinks supplement drinks (boost).   Patient denied checking her BP's on a regular basis and the last time it was checked was by palliative care RN last month. Patient unable to review meds with SW as she could not see the labels on the bottles, caregiver not present at time of call. Patient shares that she has a follow up appointments with ophthalmology and PCP next month.  Patient denies financial, food insecurities or issues with transportation. No other psychosocial needs. Patient states that she feels she has been doing well. Patient appreciative of telephonic check in.   Palliative care will continue to monitor and assist with long term care planning as needed.

## 2021-03-21 DIAGNOSIS — M549 Dorsalgia, unspecified: Secondary | ICD-10-CM | POA: Diagnosis not present

## 2021-03-21 DIAGNOSIS — Z9181 History of falling: Secondary | ICD-10-CM | POA: Diagnosis not present

## 2021-03-21 DIAGNOSIS — I10 Essential (primary) hypertension: Secondary | ICD-10-CM | POA: Diagnosis not present

## 2021-03-21 DIAGNOSIS — K5909 Other constipation: Secondary | ICD-10-CM | POA: Diagnosis not present

## 2021-03-21 DIAGNOSIS — Z7901 Long term (current) use of anticoagulants: Secondary | ICD-10-CM | POA: Diagnosis not present

## 2021-03-21 DIAGNOSIS — G8929 Other chronic pain: Secondary | ICD-10-CM | POA: Diagnosis not present

## 2021-03-25 DIAGNOSIS — G8929 Other chronic pain: Secondary | ICD-10-CM | POA: Diagnosis not present

## 2021-03-25 DIAGNOSIS — I10 Essential (primary) hypertension: Secondary | ICD-10-CM | POA: Diagnosis not present

## 2021-03-25 DIAGNOSIS — Z7901 Long term (current) use of anticoagulants: Secondary | ICD-10-CM | POA: Diagnosis not present

## 2021-03-25 DIAGNOSIS — K5909 Other constipation: Secondary | ICD-10-CM | POA: Diagnosis not present

## 2021-03-25 DIAGNOSIS — Z9181 History of falling: Secondary | ICD-10-CM | POA: Diagnosis not present

## 2021-03-25 DIAGNOSIS — M549 Dorsalgia, unspecified: Secondary | ICD-10-CM | POA: Diagnosis not present

## 2021-03-26 ENCOUNTER — Other Ambulatory Visit: Payer: Self-pay

## 2021-03-26 MED ORDER — HYDROCODONE-ACETAMINOPHEN 5-325 MG PO TABS: 1.0000 | ORAL_TABLET | Freq: Four times a day (QID) | ORAL | 0 refills | Status: DC | PRN

## 2021-03-26 NOTE — Telephone Encounter (Signed)
.   LAST APPOINTMENT DATE: 03/09/2021   NEXT APPOINTMENT DATE:@5 /03/2021  MEDICATION:HYDROcodone-acetaminophen (NORCO/VICODIN) 5-325 MG tablet   PHARMACY: [id number] - , [place] - [address] AT [facility] OF [address]

## 2021-03-28 DIAGNOSIS — G8929 Other chronic pain: Secondary | ICD-10-CM | POA: Diagnosis not present

## 2021-03-28 DIAGNOSIS — Z9181 History of falling: Secondary | ICD-10-CM | POA: Diagnosis not present

## 2021-03-28 DIAGNOSIS — M549 Dorsalgia, unspecified: Secondary | ICD-10-CM | POA: Diagnosis not present

## 2021-03-28 DIAGNOSIS — I10 Essential (primary) hypertension: Secondary | ICD-10-CM | POA: Diagnosis not present

## 2021-03-28 DIAGNOSIS — Z7901 Long term (current) use of anticoagulants: Secondary | ICD-10-CM | POA: Diagnosis not present

## 2021-03-28 DIAGNOSIS — K5909 Other constipation: Secondary | ICD-10-CM | POA: Diagnosis not present

## 2021-04-02 NOTE — Patient Instructions (Addendum)
Team please set a reminder to check on status of reclast infusion in 1 month  Please stop by lab before you go If you have mychart- we will send your results within 3 business days of [place] receiving them.  If you do not have mychart- we will call you about results within 5 business days of [place] receiving them.  *please also note that you will see labs on mychart as soon as they post. I will later go in and write notes on them- will say "notes from Dr. Durene Cal"  Lets try to make this the last hydrocodone refill- after that potentially try lower dose tramadol again  Recommended follow up: Return in about 1 month (around 05/05/2021) for follow up- or sooner if needed.

## 2021-04-02 NOTE — Progress Notes (Signed)
Phone [phone] In person visit   Subjective:   Cheryl Atkinson is a 87 y.o. year old very pleasant female patient who presents for/with See problem oriented charting Chief Complaint  Patient presents with  . Atrial Fibrillation  . Depression  . Chronic Heart Failure   . Restless Leg  . Hyperlipidemia  . Hyperglycemia  . COPD   This visit occurred during the SARS-CoV-2 public health emergency.  Safety protocols were in place, including screening questions prior to the visit, additional usage of staff PPE, and extensive cleaning of exam room while observing appropriate contact time as indicated for disinfecting solutions.   Past Medical History-  Patient Active Problem List   Diagnosis Date Noted  . Pleural effusion due to CHF (congestive heart failure) (HCC) 05/26/2020    Priority: High  . Heart failure with preserved ejection fraction (HCC) 03/01/2020    Priority: High  . Bacteremia 01/28/2020    Priority: High  . Atrial fibrillation (HCC) 01/25/2020    Priority: High  . Memory loss 04/11/2016    Priority: High  . Renal artery stenosis (HCC) 09/27/2015    Priority: High  . Claudication (HCC) 05/04/2015    Priority: High  . Atherosclerotic PVD with intermittent claudication (HCC) 04/26/2015    Priority: High  . DNR (do not resuscitate) 09/29/2014    Priority: High  . Hyperglycemia 07/19/2014    Priority: High  . LOW BACK PAIN 09/25/2007    Priority: High  . Insomnia 03/01/2020    Priority: Medium  . Major depression 03/25/2018    Priority: Medium  . BPPV (benign paroxysmal positional vertigo) 07/12/2016    Priority: Medium  . Hyperlipidemia 06/30/2015    Priority: Medium  . Former smoker 09/29/2014    Priority: Medium  . COPD (chronic obstructive pulmonary disease) (HCC) 09/20/2009    Priority: Medium  . Iron deficiency anemia 11/09/2007    Priority: Medium  . RESTLESS LEG SYNDROME 09/25/2007    Priority: Medium  . Essential hypertension 09/25/2007     Priority: Medium  . GERD 09/25/2007    Priority: Medium  . Osteoporosis 09/25/2007    Priority: Medium  . S/P laparoscopic cholecystectomy 04/04/2020    Priority: Low  . Abdominal aortic ectasia (HCC) 12/29/2019    Priority: Low  . Cat bite of right hand 12/21/2016    Priority: Low  . Mallet toe of right foot 10/20/2015    Priority: Low  . Tinnitus 12/31/2014    Priority: Low  . Multinodular goiter 08/30/2013    Priority: Low  . Spinal stenosis of lumbar region at multiple levels 09/29/2012    Priority: Low  . HIP PAIN, BILATERAL 07/16/2010    Priority: Low  . CONSTIPATION, CHRONIC 09/20/2009    Priority: Low  . History of UTI 11/09/2007    Priority: Low  . B12 deficiency 02/27/2021    Medications- reviewed and updated Current Outpatient Medications  Medication Sig Dispense Refill  . Albuterol Sulfate (PROAIR RESPICLICK) 108 (90 Base) MCG/ACT AEPB Inhale 2 puffs into the lungs every 6 (six) hours as needed (shortness of breath from COPD). 1 each 5  . apixaban (ELIQUIS) 5 MG TABS tablet Take 1 tablet (5 mg total) by mouth 2 (two) times daily. Start 04/06/20 180 tablet 3  . CALCIUM PO Take by mouth.    . Cholecalciferol (VITAMIN D3 PO) Take by mouth.    . ferrous sulfate 325 (65 FE) MG tablet Take 325 mg by mouth 2 (two) times a week. Patient  takes one tablet on Monday and Friday    . furosemide (LASIX) 40 MG tablet Take 1 tablet (40 mg total) by mouth 2 (two) times daily. 180 tablet 3  . HYDROcodone-acetaminophen (NORCO/VICODIN) 5-325 MG tablet Take 1 tablet by mouth every 6 (six) hours as needed for severe pain. 20 tablet 0  . irbesartan (AVAPRO) 300 MG tablet Take 1 tablet (300 mg total) by mouth daily. 90 tablet 3  . LINZESS 145 MCG CAPS capsule Take 145 mcg by mouth every morning.    . Multiple Vitamins-Minerals (PRESERVISION AREDS 2 PO) Take 1 tablet by mouth 2 (two) times daily.    . polyethylene glycol (MIRALAX / GLYCOLAX) 17 g packet Take 17 g by mouth 2 (two) times  daily. Reported on 03/14/2016 (Patient taking differently: Take 17 g by mouth daily as needed for mild constipation.)    . potassium chloride SA (KLOR-CON) 20 MEQ tablet Take 1 tablet (20 mEq total) by mouth 2 (two) times daily as needed. Needs to be taken  With each dose of lasix 180 tablet 3  . PROAIR RESPICLICK 108 (90 Base) MCG/ACT AEPB Inhale 2 puffs into the lungs every 6 (six) hours as needed for shortness of breath.    . rosuvastatin (CRESTOR) 40 MG tablet Take 1 tablet (40 mg total) by mouth daily. Take 1 tablet by mouth daily. 90 tablet 3  . Rotigotine (NEUPRO) 3 MG/24HR PT24 PLACE 1 PATCH (3 MG) ONTO THE SKIN AT BEDTIME 90 patch 3  . traZODone (DESYREL) 50 MG tablet Take 1.5 tablets (75 mg total) by mouth at bedtime as needed for sleep. 135 tablet 3  . vitamin B-12 (CYANOCOBALAMIN) 1000 MCG tablet Take 1,000 mcg by mouth daily.     No current facility-administered medications for this visit.     Objective:  BP 122/84   Pulse 70   Temp 98.3 F (36.8 C) (Temporal)   Ht 5\' 6"  (1.676 m)   Wt 138 lb 9.6 oz (62.9 kg)   LMP  (LMP Unknown)   SpO2 96%   BMI 22.37 kg/m  Gen: NAD, resting comfortably CV: Regular heart rate in the 70s Lungs: CTAB no crackles, wheeze, rhonchi Ext: no edema Skin: warm, dry Sitting in wheelchair    Assessment and Plan  #L2 compression fracture plus chronic back pain (prior tramadol)-continues to heal slowly.  Patient still requiring some pain management. For most part doing half a hydrocodone once a day- if she has busier days she may take two halves in a day -walking with walker, not alone thankfully to be safe. Feels more comfortable. PT continues to come out -#10 on 01/27/21 then #28 on 01/29/21 -#20 on 02/05/21, 02/12/21 (a week apart) -#20 on 3/29 and then 03/09/21 (closer to 2 weeks apart) - #20 on 03/26/21 - discussed potentially one more refill if possible and trying to come off  -tylenol arthritis has not helped -could go back to tramadol- did have  issues with 300 mg verion in past- may use just 50mg  if needed  #Osteoporosis-  S: we have discussed taking at least 1200 mg of calcium per day and at least 800 units of vitamin D as well.  We will try to set patient up with Reclast infusion due to history of abdominal pain on Fosamax  A/P: doing well with calcium and vitamin D- our team has a plan in place to get her set up with reclast soon   # Atrial fibrillation- new onset 2021 S: Rate controlled without medication  Anticoagulated with eliquis 5 mg BID Patient is  followed by cardiology: Dr. Allyson Sabal  A/P:  Stable. Continue current medications.   #CHF with preserved EF  #Hypertension S: Compliant with irbesartan 300 mg (ok per cards as only unilateral renal artery stenosis) and lasix 40mg  . No recent swelling or recent weight gain.  -off hctz since starting lasix -off amlodiine march 2021 with edema issues .  Blood pressure high in past with caffeine intake A/P: CHF and hypertension appear stable- continue current meds- update labs as also needs for reclast     #Major depression S: Patient has denied counseling and medications in the past.  Seems to have been helped with staying more engaged with friends and family. Recent pain/injury/immobility has been a big set back though Depression screen [facility] 2/9 04/04/2021 12/04/2020 12/04/2020  Decreased Interest 0 0 0  Down, Depressed, Hopeless 0 0 0  PHQ - 2 Score 0 0 0  Altered sleeping 3 0 -  Tired, decreased energy 3 0 -  Change in appetite 1 1 -  Feeling bad or failure about yourself  1 0 -  Trouble concentrating 0 0 -  Moving slowly or fidgety/restless 0 0 -  Suicidal thoughts 0 0 -  PHQ-9 Score 8 1 -  Difficult doing work/chores Not difficult at all Not difficult at all -  Some recent data might be hidden  A/P: some of this appears to be situational with recent set backs- she would like to monitor as symptoms contiue to hopefully improve and hold off on therapy or medicine for now.      Recommended follow up: Return in about 1 month (around 05/05/2021) for follow up- or sooner if needed. Future Appointments  Date Time Provider Department Center  05/01/2021  7:45 AM 05/03/2021, MD [facility] None  05/09/2021 10:00 AM 07/09/2021, MD [facility]  07/20/2021  9:30 AM [facility] HEALTH COACH [facility]    Lab/Order associations:   ICD-10-CM   1. Essential hypertension  I10 CBC with Differential/Platelet    Comprehensive metabolic panel  2. Longstanding persistent atrial fibrillation (HCC)  I48.11   3. Age-related osteoporosis with current pathological fracture, initial encounter  M80.00XA   4. Recurrent major depressive disorder, in partial remission (HCC)  F33.41   5. Compression fracture of L2 vertebra, initial encounter (HCC)  S32.020A     Return precautions advised.  07/22/2021, MD

## 2021-04-03 ENCOUNTER — Other Ambulatory Visit: Payer: Self-pay

## 2021-04-03 ENCOUNTER — Encounter (INDEPENDENT_AMBULATORY_CARE_PROVIDER_SITE_OTHER): Payer: Medicare Other | Admitting: Ophthalmology

## 2021-04-03 DIAGNOSIS — H43813 Vitreous degeneration, bilateral: Secondary | ICD-10-CM | POA: Diagnosis not present

## 2021-04-03 DIAGNOSIS — H353112 Nonexudative age-related macular degeneration, right eye, intermediate dry stage: Secondary | ICD-10-CM | POA: Diagnosis not present

## 2021-04-03 DIAGNOSIS — H353221 Exudative age-related macular degeneration, left eye, with active choroidal neovascularization: Secondary | ICD-10-CM

## 2021-04-03 DIAGNOSIS — H35033 Hypertensive retinopathy, bilateral: Secondary | ICD-10-CM | POA: Diagnosis not present

## 2021-04-03 DIAGNOSIS — I10 Essential (primary) hypertension: Secondary | ICD-10-CM | POA: Diagnosis not present

## 2021-04-04 ENCOUNTER — Ambulatory Visit (INDEPENDENT_AMBULATORY_CARE_PROVIDER_SITE_OTHER): Payer: Medicare Other | Admitting: Family Medicine

## 2021-04-04 ENCOUNTER — Encounter: Payer: Self-pay | Admitting: Family Medicine

## 2021-04-04 VITALS — BP 122/84 | HR 70 | Temp 98.3°F | Ht 66.0 in | Wt 138.6 lb

## 2021-04-04 DIAGNOSIS — I10 Essential (primary) hypertension: Secondary | ICD-10-CM

## 2021-04-04 DIAGNOSIS — I4811 Longstanding persistent atrial fibrillation: Secondary | ICD-10-CM

## 2021-04-04 DIAGNOSIS — S32020A Wedge compression fracture of second lumbar vertebra, initial encounter for closed fracture: Secondary | ICD-10-CM

## 2021-04-04 DIAGNOSIS — M8000XA Age-related osteoporosis with current pathological fracture, unspecified site, initial encounter for fracture: Secondary | ICD-10-CM | POA: Diagnosis not present

## 2021-04-04 DIAGNOSIS — F3341 Major depressive disorder, recurrent, in partial remission: Secondary | ICD-10-CM

## 2021-04-04 DIAGNOSIS — I701 Atherosclerosis of renal artery: Secondary | ICD-10-CM

## 2021-04-04 LAB — CBC WITH DIFFERENTIAL/PLATELET
Basophils Absolute: 0.1 10*3/uL (ref 0.0–0.1)
Basophils Relative: 0.9 % (ref 0.0–3.0)
Eosinophils Absolute: 0.2 10*3/uL (ref 0.0–0.7)
Eosinophils Relative: 2.7 % (ref 0.0–5.0)
HCT: 33.2 % — ABNORMAL LOW (ref 36.0–46.0)
Hemoglobin: 10.9 g/dL — ABNORMAL LOW (ref 12.0–15.0)
Lymphocytes Relative: 33.6 % (ref 12.0–46.0)
Lymphs Abs: 2.1 10*3/uL (ref 0.7–4.0)
MCHC: 32.8 g/dL (ref 30.0–36.0)
MCV: 88.3 fl (ref 78.0–100.0)
Monocytes Absolute: 0.6 10*3/uL (ref 0.1–1.0)
Monocytes Relative: 8.8 % (ref 3.0–12.0)
Neutro Abs: 3.4 10*3/uL (ref 1.4–7.7)
Neutrophils Relative %: 54 % (ref 43.0–77.0)
Platelets: 213 10*3/uL (ref 150.0–400.0)
RBC: 3.76 Mil/uL — ABNORMAL LOW (ref 3.87–5.11)
RDW: 15.1 % (ref 11.5–15.5)
WBC: 6.4 10*3/uL (ref 4.0–10.5)

## 2021-04-04 LAB — COMPREHENSIVE METABOLIC PANEL
ALT: 10 U/L (ref 0–35)
AST: 14 U/L (ref 0–37)
Albumin: 3.8 g/dL (ref 3.5–5.2)
Alkaline Phosphatase: 128 U/L — ABNORMAL HIGH (ref 39–117)
BUN: 21 mg/dL (ref 6–23)
CO2: 29 mEq/L (ref 19–32)
Calcium: 8.8 mg/dL (ref 8.4–10.5)
Chloride: 103 mEq/L (ref 96–112)
Creatinine, Ser: 0.89 mg/dL (ref 0.40–1.20)
GFR: 58.22 mL/min — ABNORMAL LOW (ref >60.00)
Glucose, Bld: 96 mg/dL (ref 70–99)
Potassium: 3.5 mEq/L (ref 3.5–5.1)
Sodium: 140 mEq/L (ref 135–145)
Total Bilirubin: 0.6 mg/dL (ref 0.2–1.2)
Total Protein: 6.8 g/dL (ref 6.0–8.3)

## 2021-04-05 ENCOUNTER — Telehealth: Payer: Self-pay

## 2021-04-05 DIAGNOSIS — Z9181 History of falling: Secondary | ICD-10-CM | POA: Diagnosis not present

## 2021-04-05 DIAGNOSIS — K5909 Other constipation: Secondary | ICD-10-CM | POA: Diagnosis not present

## 2021-04-05 DIAGNOSIS — G8929 Other chronic pain: Secondary | ICD-10-CM | POA: Diagnosis not present

## 2021-04-05 DIAGNOSIS — Z7901 Long term (current) use of anticoagulants: Secondary | ICD-10-CM | POA: Diagnosis not present

## 2021-04-05 DIAGNOSIS — M549 Dorsalgia, unspecified: Secondary | ICD-10-CM | POA: Diagnosis not present

## 2021-04-05 DIAGNOSIS — I10 Essential (primary) hypertension: Secondary | ICD-10-CM | POA: Diagnosis not present

## 2021-04-05 NOTE — Telephone Encounter (Signed)
920 am.  Phone call made to patient to schedule a home visit for next week.  No answer on the home phone and unable to leave a message.  PLAN:  Palliative Care will outreach patient again at a later date.

## 2021-04-06 ENCOUNTER — Telehealth: Payer: Self-pay

## 2021-04-06 NOTE — Telephone Encounter (Signed)
Have her continue half pill and continue to try to space out-give [place] an update next week with # of pills left and we can decide- this far out from injury we really want to space it out

## 2021-04-06 NOTE — Telephone Encounter (Signed)
Called and spoke with pt and she states yes she did pick that Rx up and she takes 1/2 a tab but if that dosen't help then she will take the other half alter which equals a full tab. Pt states she has 10 left out of what she picked up on 03/26/21.

## 2021-04-06 NOTE — Telephone Encounter (Signed)
Max she said she was using was two haves a day and she was given #20 on 03/26/21- she did pick up that rx correct? She should have several days of medication left particularly if only using one half unless she was underreporting use. We talked about weaning off them after next refill but she should still be on the last refill

## 2021-04-06 NOTE — Telephone Encounter (Signed)
Called and relayed message to pt.

## 2021-04-06 NOTE — Telephone Encounter (Signed)
Called and spoke with pharmacy, last time hydrocodone was filled was on 03/26/21.  From last OV note " still requiring some pain management. For most part doing half a hydrocodone once a day- if she has busier days she may take two halves in a day -walking with walker, not alone thankfully to be safe. Feels more comfortable. PT continues to come out -#10 on 01/27/21 then #28 on 01/29/21 -#20 on 02/05/21, 02/12/21 (a week apart) -#20 on 3/29 and then 03/09/21 (closer to 2 weeks apart) - #20 on 03/26/21 - discussed potentially one more refill if possible and trying to come off  -tylenol arthritis has not helped -could go back to tramadol- did have issues with 300 mg verion in past- may use just 50mg  if needed

## 2021-04-06 NOTE — Telephone Encounter (Signed)
Patient states dr.hunter stated he was going to send in some pain medication at last apt and patient states there is nothing at pharmacy for her to pick up was this correct or did patient misunderstand according to brenda sumter patient could've misunderstood what dr hunter was saying and if not can we send in the medication for her  Please advise  Thank you

## 2021-04-10 NOTE — Telephone Encounter (Signed)
Okay update [place] again on Friday-continue to space out if possible

## 2021-04-10 NOTE — Telephone Encounter (Signed)
Patient returned call and states she has 7 whole pills left and 1 -1/2 pill

## 2021-04-10 NOTE — Telephone Encounter (Signed)
See below

## 2021-04-10 NOTE — Telephone Encounter (Signed)
Called and gave pt below message and she states she just took a whole a few minutes before I called b/c her back Is really bothering her. She is still trying to space them out without making herself be in pain and she will update [place] Friday morning.

## 2021-04-11 DIAGNOSIS — M549 Dorsalgia, unspecified: Secondary | ICD-10-CM | POA: Diagnosis not present

## 2021-04-11 DIAGNOSIS — G8929 Other chronic pain: Secondary | ICD-10-CM | POA: Diagnosis not present

## 2021-04-11 DIAGNOSIS — K5909 Other constipation: Secondary | ICD-10-CM | POA: Diagnosis not present

## 2021-04-11 DIAGNOSIS — Z9181 History of falling: Secondary | ICD-10-CM | POA: Diagnosis not present

## 2021-04-11 DIAGNOSIS — Z7901 Long term (current) use of anticoagulants: Secondary | ICD-10-CM | POA: Diagnosis not present

## 2021-04-11 DIAGNOSIS — I10 Essential (primary) hypertension: Secondary | ICD-10-CM | POA: Diagnosis not present

## 2021-04-13 MED ORDER — HYDROCODONE-ACETAMINOPHEN 5-325 MG PO TABS: 1.0000 | ORAL_TABLET | Freq: Four times a day (QID) | ORAL | 0 refills | Status: DC | PRN

## 2021-04-13 NOTE — Addendum Note (Signed)
Addended by: Shelva Majestic on: 04/13/2021 12:44 PM   Modules accepted: Orders

## 2021-04-13 NOTE — Telephone Encounter (Signed)
Are we sending in more pain medication Dr. Durene Cal ?

## 2021-04-13 NOTE — Telephone Encounter (Signed)
Pt called stating she has 4 pain pills left. Please advise.

## 2021-04-13 NOTE — Telephone Encounter (Signed)
I sent this in but please encourage her to try to space these out

## 2021-04-13 NOTE — Telephone Encounter (Signed)
Patient is aware that her medication has been sent in she is also aware of Dr. Pamala Hurry comments

## 2021-04-17 ENCOUNTER — Telehealth: Payer: Self-pay

## 2021-04-17 NOTE — Telephone Encounter (Signed)
232 pm.  Phone call made to patient to schedule a visit for this week.  Patient is agreeable to Thursday at 10 am.

## 2021-04-19 ENCOUNTER — Other Ambulatory Visit: Payer: Medicare Other

## 2021-04-19 ENCOUNTER — Other Ambulatory Visit: Payer: Self-pay

## 2021-04-19 VITALS — BP 118/62 | HR 64 | Temp 98.4°F | Resp 18

## 2021-04-19 DIAGNOSIS — Z515 Encounter for palliative care: Secondary | ICD-10-CM

## 2021-04-19 NOTE — Progress Notes (Signed)
PATIENT NAME: Cheryl Atkinson DOB: [birth year] MRN: [id number]  PRIMARY CARE PROVIDER: Shelva Majestic, MD  RESPONSIBLE PARTY:  Acct ID - Guarantor Home Phone Work Phone Relationship Acct Type  [id number] ANITA, LAGUNA* [phone]  Self P/F     [address], [place], [place] [address]    PLAN OF CARE and INTERVENTIONS:               1.  GOALS OF CARE/ ADVANCE CARE PLANNING:  Remain home under the care of a private sitter during the daytime hours and her son during the night.               2.  PATIENT/CAREGIVER EDUCATION:  Reclast and Safety               4. PERSONAL EMERGENCY PLAN:  Activate 911 for emergencies.               5.  DISEASE STATUS:  Patient found in her recliner chair with private caregiver present.  She continues to use a rolling walker to ambulate.  No recent falls are reported. Encouraged patient to utilize her walker for safety.   Patient continues to have occasional pain to her lower back related to a fall earlier this year.  This fall resulted in a compression fracture.  She is taking Norco 1/2 tab for severe pain.  She notes pain is worse if she has been up walking for a long period of time.  Patient denies taking Norco this week. She will go back to tramadol should pain continue.  She is now using a wheelchair when she goes out for appointments and shopping.  Patient notes she does not go out as much as she did prior to her fall in February.  Patient has completed physical therapy.  Po intake remains good.  Patient is enjoying fresh fruits and veggies.  She hopes to start doing simple cooking with supervision soon.  No weight loss is suspected by patient as clothes are not loose fitting.  Patient reports some issues with insomnia.  She is taking trazodone at bedtime and notes this does help.  Patient denies napping during the daytime hours.  Patient confirms medications compliance.  She is asking about Reclast and how this is administered.  Education provided on the  purpose and IV adminstration of this medication.  Patient advises she was not able to tolerate fosamax and this was the next treatment plan.  Advised PCP office will follow up on status of this infusion next month per MD notes.   HISTORY OF PRESENT ILLNESS:  87 year old female with atrial fibrillation and HTN.  Patient is being followed by Palliative Care monthly and PRN.  CODE STATUS: DNR-form in the home  ADVANCED DIRECTIVES: No MOST FORM: Yes PPS: 50%   PHYSICAL EXAM:   VITALS: Today's Vitals   04/19/21 1007  BP: 118/62  Pulse: 64  Resp: 18  Temp: 98.4 F (36.9 C)  SpO2: 97%  PainSc: 0-No pain    LUNGS: CTA, no wheezes or shortness of breath reported. CARDIAC: IRR EXTREMITIES: trace ankle edema SKIN: no skin breakdown reported by patient. NEURO: alert and oriented x 4       Truitt Merle, RN

## 2021-05-01 ENCOUNTER — Other Ambulatory Visit: Payer: Self-pay

## 2021-05-01 ENCOUNTER — Telehealth: Payer: Self-pay | Admitting: *Deleted

## 2021-05-01 ENCOUNTER — Encounter (INDEPENDENT_AMBULATORY_CARE_PROVIDER_SITE_OTHER): Payer: Medicare Other | Admitting: Ophthalmology

## 2021-05-01 DIAGNOSIS — H35033 Hypertensive retinopathy, bilateral: Secondary | ICD-10-CM | POA: Diagnosis not present

## 2021-05-01 DIAGNOSIS — H353112 Nonexudative age-related macular degeneration, right eye, intermediate dry stage: Secondary | ICD-10-CM

## 2021-05-01 DIAGNOSIS — H43813 Vitreous degeneration, bilateral: Secondary | ICD-10-CM

## 2021-05-01 DIAGNOSIS — I10 Essential (primary) hypertension: Secondary | ICD-10-CM | POA: Diagnosis not present

## 2021-05-01 DIAGNOSIS — H353221 Exudative age-related macular degeneration, left eye, with active choroidal neovascularization: Secondary | ICD-10-CM

## 2021-05-01 NOTE — Chronic Care Management (AMB) (Signed)
  Chronic Care Management   Outreach Note  05/01/2021 Name: Cheryl Atkinson MRN: [id number] DOB: 11-01-1931  Cheryl Atkinson is a 87 y.o. year old female who is a primary care patient of Shelva Majestic, MD. I reached out to [name] by phone today in response to a referral sent by Ms. [name]'s PCP, Dr. Durene Cal.      An unsuccessful telephone outreach was attempted today. The patient was referred to the case management team for assistance with care management and care coordination.   Follow Up Plan:  The care management team will reach out to the patient again over the next 7 days. If patient returns call to provider office, please advise to call Embedded Care Management Care Guide Gwenevere Ghazi at [phone].  Gwenevere Ghazi  Care Guide, Embedded Care Coordination [facility] Management

## 2021-05-09 ENCOUNTER — Encounter: Payer: Self-pay | Admitting: Family Medicine

## 2021-05-09 ENCOUNTER — Other Ambulatory Visit: Payer: Self-pay

## 2021-05-09 ENCOUNTER — Ambulatory Visit (INDEPENDENT_AMBULATORY_CARE_PROVIDER_SITE_OTHER): Payer: Medicare Other | Admitting: Family Medicine

## 2021-05-09 VITALS — BP 184/96 | HR 81 | Temp 98.8°F | Ht 66.0 in | Wt 139.0 lb

## 2021-05-09 DIAGNOSIS — I701 Atherosclerosis of renal artery: Secondary | ICD-10-CM | POA: Diagnosis not present

## 2021-05-09 DIAGNOSIS — I5032 Chronic diastolic (congestive) heart failure: Secondary | ICD-10-CM

## 2021-05-09 DIAGNOSIS — I10 Essential (primary) hypertension: Secondary | ICD-10-CM | POA: Diagnosis not present

## 2021-05-09 DIAGNOSIS — I4811 Longstanding persistent atrial fibrillation: Secondary | ICD-10-CM | POA: Diagnosis not present

## 2021-05-09 DIAGNOSIS — G2581 Restless legs syndrome: Secondary | ICD-10-CM | POA: Diagnosis not present

## 2021-05-09 MED ORDER — HYDROCODONE-ACETAMINOPHEN 5-325 MG PO TABS: 1.0000 | ORAL_TABLET | Freq: Four times a day (QID) | ORAL | 0 refills | Status: DC | PRN

## 2021-05-09 NOTE — Progress Notes (Signed)
Phone [phone] In person visit   Subjective:   Cheryl Atkinson is a 87 y.o. year old very pleasant female patient who presents for/with See problem oriented charting Chief Complaint  Patient presents with  . Hypertension    This visit occurred during the SARS-CoV-2 public health emergency.  Safety protocols were in place, including screening questions prior to the visit, additional usage of staff PPE, and extensive cleaning of exam room while observing appropriate contact time as indicated for disinfecting solutions.   Past Medical History-  Patient Active Problem List   Diagnosis Date Noted  . Pleural effusion due to CHF (congestive heart failure) ([place]) 05/26/2020    Priority: High  . Heart failure with preserved ejection fraction ([place]) 03/01/2020    Priority: High  . Bacteremia 01/28/2020    Priority: High  . Atrial fibrillation ([place]) 01/25/2020    Priority: High  . Memory loss 04/11/2016    Priority: High  . Renal artery stenosis ([place]) 09/27/2015    Priority: High  . Claudication ([place]) 05/04/2015    Priority: High  . Atherosclerotic PVD with intermittent claudication ([place]) 04/26/2015    Priority: High  . DNR (do not resuscitate) 09/29/2014    Priority: High  . Hyperglycemia 07/19/2014    Priority: High  . LOW BACK PAIN 09/25/2007    Priority: High  . Insomnia 03/01/2020    Priority: Medium  . Major depression 03/25/2018    Priority: Medium  . BPPV (benign paroxysmal positional vertigo) 07/12/2016    Priority: Medium  . Hyperlipidemia 06/30/2015    Priority: Medium  . Former smoker 09/29/2014    Priority: Medium  . COPD (chronic obstructive pulmonary disease) ([place]) 09/20/2009    Priority: Medium  . Iron deficiency anemia 11/09/2007    Priority: Medium  . RESTLESS LEG SYNDROME 09/25/2007    Priority: Medium  . Essential hypertension 09/25/2007    Priority: Medium  . GERD 09/25/2007    Priority: Medium  . Osteoporosis 09/25/2007    Priority: Medium  .  S/P laparoscopic cholecystectomy 04/04/2020    Priority: Low  . Abdominal aortic ectasia ([place]) 12/29/2019    Priority: Low  . Cat bite of right hand 12/21/2016    Priority: Low  . Mallet toe of right foot 10/20/2015    Priority: Low  . Tinnitus 12/31/2014    Priority: Low  . Multinodular goiter 08/30/2013    Priority: Low  . Spinal stenosis of lumbar region at multiple levels 09/29/2012    Priority: Low  . HIP PAIN, BILATERAL 07/16/2010    Priority: Low  . CONSTIPATION, CHRONIC 09/20/2009    Priority: Low  . History of UTI 11/09/2007    Priority: Low  . B12 deficiency 02/27/2021    Medications- reviewed and updated Current Outpatient Medications  Medication Sig Dispense Refill  . Albuterol Sulfate (PROAIR RESPICLICK) 703 (90 Base) MCG/ACT AEPB Inhale 2 puffs into the lungs every 6 (six) hours as needed (shortness of breath from COPD). 1 each 5  . apixaban (ELIQUIS) 5 MG TABS tablet Take 1 tablet (5 mg total) by mouth 2 (two) times daily. Start 04/06/20 180 tablet 3  . CALCIUM PO Take by mouth.    . Cholecalciferol (VITAMIN D3 PO) Take by mouth.    . ferrous sulfate 325 (65 FE) MG tablet Take 325 mg by mouth 2 (two) times a week. Patient takes one tablet on Monday and Friday    . furosemide (LASIX) 40 MG tablet Take 1 tablet (40 mg total)  by mouth 2 (two) times daily. 180 tablet 3  . irbesartan (AVAPRO) 300 MG tablet Take 1 tablet (300 mg total) by mouth daily. 90 tablet 3  . LINZESS 145 MCG CAPS capsule Take 145 mcg by mouth every morning.    . Multiple Vitamins-Minerals (PRESERVISION AREDS 2 PO) Take 1 tablet by mouth 2 (two) times daily.    . polyethylene glycol (MIRALAX / GLYCOLAX) 17 g packet Take 17 g by mouth 2 (two) times daily. Reported on 03/14/2016 (Patient taking differently: Take 17 g by mouth daily as needed for mild constipation.)    . potassium chloride SA (KLOR-CON) 20 MEQ tablet Take 1 tablet (20 mEq total) by mouth 2 (two) times daily as needed. Needs to be taken   With each dose of lasix 180 tablet 3  . PROAIR RESPICLICK 466 (90 Base) MCG/ACT AEPB Inhale 2 puffs into the lungs every 6 (six) hours as needed for shortness of breath.    . rosuvastatin (CRESTOR) 40 MG tablet Take 1 tablet (40 mg total) by mouth daily. Take 1 tablet by mouth daily. 90 tablet 3  . Rotigotine (NEUPRO) 3 MG/24HR PT24 PLACE 1 PATCH (3 MG) ONTO THE SKIN AT BEDTIME 90 patch 3  . traZODone (DESYREL) 50 MG tablet Take 1.5 tablets (75 mg total) by mouth at bedtime as needed for sleep. 135 tablet 3  . vitamin B-12 (CYANOCOBALAMIN) 1000 MCG tablet Take 1,000 mcg by mouth daily.    Marland Kitchen HYDROcodone-acetaminophen (NORCO/VICODIN) 5-325 MG tablet Take 1 tablet by mouth every 6 (six) hours as needed for severe pain. 20 tablet 0   No current facility-administered medications for this visit.     Objective:  BP (!) 184/96   Pulse 81   Temp 98.8 F (37.1 C) (Temporal)   Ht '5\' 6"'  (1.676 m)   Wt 139 lb (63 kg)   LMP  (LMP Unknown)   SpO2 98%   BMI 22.44 kg/m  Gen: NAD, resting comfortably CV: RRR no murmurs rubs or gallops Lungs: CTAB no crackles, wheeze, rhonchi Ext: no edema Skin: warm, dry Neuro:  In wheelchair today    Assessment and Plan   #L2 compression fracture S: patient states when straining to get into the bed it will flare pain up (bed is high)- discussed possible adjustments but may not have the support from son to get . Son has not been helpful in getting her into bed. They also have bought some sort of incontinence kit to help her form getting in and out of bed. Turning wrong can also trigger things.   Taking half tablet of hydrocodone every other day perhaps- #20 has lasted nearly a month.   Has about 4 pills left.  A/P: Compression fracture appears to be healing and pain associated with this has significantly improved-has had chronic back pain even prior to this-using very sparing hydrocodone at this point half tablet every other day-I did provide a refill and we  discussed doing a 32-monthfollow-up- or sooner if BP not improved   # Atrial fibrillation- new onset 2021 S: Rate controlled without medication  Anticoagulated with eliquis 5 mg BID Patient is  followed by cardiology: Dr. BGwenlyn Found A/P: Appropriately anticoagulated and rate controlled-continue current medication  #CHF with preserved EF #Hypertension S: Compliant with irbesartan 300 mg (ok per cards as only unilateral renal artery stenosis) and lasix 465m- patient states she had severe anxiety this morning after a big discussion/argument with her son. He even took her car and  has left her by herself- goes to drink alcohol.  -off hctz since starting lasix -off amlodipine march 2021 with edema issues Blood pressure high in past with caffeine intake- she had a cup of coffee but decaf this AM BP Readings from Last 3 Encounters:  05/09/21 (!) 184/96  04/19/21 118/62  04/04/21 122/84  A/P: Hypertension very poorly controlled today but traditionally well controlled-suspect elevated due to recent significant stressor.  She is going to continue irbesartan 300 mg as well as Lasix 40 mg.  I do want her to take an amlodipine 2.5 mg (half of 5 mg tablet) today when she gets home- she has had some swelling issues in the past but I would like to get her blood pressure down more.  She is going to check her blood pressure each morning and she can take 1/2 tablet of amlodipine if above 150/90. She will update our office if blood pressures not coming back down as she gets further out from stressful event- hoping she does not need amlodipine long term with heart failure history -No chest pain, shortness of breath, lightheadedness, headaches, blurred vision-she knows if these develop she should seek care immediately.  CHF appears well controlled/she appears euvolemic-continue Lasix once daily. Twice daily if swelling.    #Restless leg syndrome S: Patient on Neupro 3 mg. Ferritin levels have been low in the past-  taking iron Monday and friday Lab Results  Component Value Date   FERRITIN 42 05/29/2020  A/P: Patient previously was on Requip and later changed to Neupro years ago-reports symptoms poorly controlled.  I am concerned about augmentation/tolerance- consider gabapentin but I am concerned about worsening swelling and history of depression- I would prefer to get neurology's expert opinion-she will be referred today neurology -Ferritin levels hopefully increasing-likely recheck with next blood work - I wonder if issues with low back could be contributing.  #Osteoporosis- patient has not heard back about Reclast-I am going to have my team investigate-see last bone density from March.  Team please set a reminder to yourself to check on this process in 3 weeks and make sure it has been followed through on.  Recommended follow up: Return in about 2 months (around 07/09/2021) for follow up- or sooner if needed. Future Appointments  Date Time Provider [place]  05/29/2021  8:30 AM Hayden Pedro, MD [facility] None  07/20/2021  9:30 AM [facility] HEALTH COACH [facility]     Lab/Order associations:   ICD-10-CM   1. RESTLESS LEG SYNDROME  G25.81 Ambulatory referral to Neurology  2. Chronic heart failure with preserved ejection fraction (HCC)  I50.32   3. Longstanding persistent atrial fibrillation (HCC)  I48.11   4. Essential hypertension  I10     Meds ordered this encounter  Medications  . HYDROcodone-acetaminophen (NORCO/VICODIN) 5-325 MG tablet    Sig: Take 1 tablet by mouth every 6 (six) hours as needed for severe pain.    Dispense:  20 tablet    Refill:  0     I,Jordan Kelly,acting as a scribe for Garret Reddish, MD.,have documented all relevant documentation on the behalf of Garret Reddish, MD,as directed by  Garret Reddish, MD while in the presence of Garret Reddish, MD.  Return precautions advised.  Garret Reddish, MD

## 2021-05-09 NOTE — Patient Instructions (Addendum)
Health Maintenance Due  Topic Date Due  . Pneumococcal Vaccine 34-85 Years old (1 of 4 - PCV13) Consider your local pharmacy (we are out today) Never done  . Zoster Vaccines- Shingrix (1 of 2) Consider your local pharmacy. Cheaper there Never done   Hypertension very poorly controlled today.  She is going to continue irbesartan 300 mg as well as Lasix 40 mg.  I do want her to take an amlodipine 2.5 mg (half of 5 mg tablet) today when she gets home- she has had some swelling issues in the past but I would like to get her blood pressure down more.  She is going to check her blood pressure each morning and she can take 1/2 tablet of amlodipine if above 150/90. She will update our office if blood pressures not coming back down as she gets further out from stressful event- hoping she does not need amlodipine long term with heart failure history  #Osteoporosis- patient has not heard back about Reclast-I am going to have my team investigate-see last bone density from March.  Team please set a reminder to yourself to check on this process in 3 weeks and make sure it has been followed through on.  Recommended follow up: Return in about 2 months (around 07/09/2021) for follow up- or sooner if needed.

## 2021-05-09 NOTE — Chronic Care Management (AMB) (Signed)
  Chronic Care Management   Outreach Note  05/09/2021 Name: NOHELI MELDER MRN: [id number] DOB: January 25, 1931  MELANYE HIRALDO is a 87 y.o. year old female who is a primary care patient of Shelva Majestic, MD. I reached out to [name] by phone today in response to a referral sent by Ms. Concha Pyo Maille's PCP, Dr. Durene Cal.     A second unsuccessful telephone outreach was attempted today. The patient was referred to the case management team for assistance with care management and care coordination.   Follow Up Plan: The care management team will reach out to the patient again over the next 7 days. If patient returns call to provider office, please advise to call Embedded Care Management Care Guide Gwenevere Ghazi at [phone].  Gwenevere Ghazi  Care Guide, Embedded Care Coordination [facility] Management

## 2021-05-17 ENCOUNTER — Encounter: Payer: Self-pay | Admitting: Neurology

## 2021-05-18 NOTE — Chronic Care Management (AMB) (Signed)
  Chronic Care Management   Note  05/18/2021 Name: Cheryl Atkinson MRN: [id number] DOB: 1931-07-24  Cheryl Atkinson is a 87 y.o. year old female who is a primary care patient of Marin Olp, MD. I reached out to Jenita Seashore by phone today in response to a referral sent by Cheryl Atkinson's PCP, Dr. Yong Channel.      Cheryl Atkinson was given information about Chronic Care Management services today including:  CCM service includes personalized support from designated clinical staff supervised by her physician, including individualized plan of care and coordination with other care providers 24/7 contact phone numbers for assistance for urgent and routine care needs. Service will only be billed when office clinical staff spend 20 minutes or more in a month to coordinate care. Only one practitioner may furnish and bill the service in a calendar month. The patient may stop CCM services at any time (effective at the end of the month) by phone call to the office staff. The patient will be responsible for cost sharing (co-pay) of up to 20% of the service fee (after annual deductible is met).  Patient agreed to services and verbal consent obtained.   Follow up plan: Telephone appointment with care management team member scheduled for: 05/24/2021  [facility] Management  Direct Dial [phone]

## 2021-05-24 ENCOUNTER — Ambulatory Visit (INDEPENDENT_AMBULATORY_CARE_PROVIDER_SITE_OTHER): Payer: Medicare Other | Admitting: *Deleted

## 2021-05-24 DIAGNOSIS — I10 Essential (primary) hypertension: Secondary | ICD-10-CM | POA: Diagnosis not present

## 2021-05-24 DIAGNOSIS — I5032 Chronic diastolic (congestive) heart failure: Secondary | ICD-10-CM | POA: Diagnosis not present

## 2021-05-24 NOTE — Chronic Care Management (AMB) (Signed)
  Care Management   Follow Up Note   05/24/2021 Name: Cheryl Atkinson MRN: [id number] DOB: [birth year]   Referred by: Shelva Majestic, MD Reason for referral : Chronic Care Management (HTN, CHF)   Successful telephone outreach to patient for CCM initial telephone assessment.   Patient answered and verified HIPAA.  RNCM introduced self and role.  Patient agrees to [facility] program but states she is unable to complete initial assessment today; requesting telephone call back another day.  Follow Up Plan: The care management team will reach out to the patient again over the next 20 business days per patients request.   Cheryl Lerner RN, MSN RN Care Management Coordinator  [facility] [phone] [email or web address]

## 2021-05-28 ENCOUNTER — Telehealth: Payer: Self-pay | Admitting: Cardiovascular Disease

## 2021-05-28 NOTE — Telephone Encounter (Signed)
Patient stated she was returning call 

## 2021-05-28 NOTE — Telephone Encounter (Signed)
Patient returning scheduling call to schedule appointment with Dr. Allyson Sabal in August. Advised patient that I would forward message to scheduling department to get follow up office visit scheduled with Dr. Allyson Sabal.   Advised patient to call back to office with any issues, questions, or concerns. Patient verbalized understanding.

## 2021-05-29 ENCOUNTER — Other Ambulatory Visit: Payer: Self-pay

## 2021-05-29 ENCOUNTER — Encounter (INDEPENDENT_AMBULATORY_CARE_PROVIDER_SITE_OTHER): Payer: Medicare Other | Admitting: Ophthalmology

## 2021-05-29 ENCOUNTER — Telehealth: Payer: Medicare Other

## 2021-05-29 DIAGNOSIS — H43813 Vitreous degeneration, bilateral: Secondary | ICD-10-CM | POA: Diagnosis not present

## 2021-05-29 DIAGNOSIS — H353112 Nonexudative age-related macular degeneration, right eye, intermediate dry stage: Secondary | ICD-10-CM

## 2021-05-29 DIAGNOSIS — Z515 Encounter for palliative care: Secondary | ICD-10-CM

## 2021-05-29 DIAGNOSIS — H35033 Hypertensive retinopathy, bilateral: Secondary | ICD-10-CM | POA: Diagnosis not present

## 2021-05-29 DIAGNOSIS — H353221 Exudative age-related macular degeneration, left eye, with active choroidal neovascularization: Secondary | ICD-10-CM

## 2021-05-29 DIAGNOSIS — I10 Essential (primary) hypertension: Secondary | ICD-10-CM

## 2021-05-29 NOTE — Progress Notes (Signed)
FOLLOW UP PALLIATIVE CARE CONSULT/SOCIAL WORK VISIT NOTE  PATIENT NAME: Cheryl Atkinson DOB: [birth year] MRN: [id number]  REFERRING PROVIDER: Shelva Majestic, MD [address] [place],  [place] [address]   RESPONSIBLE PARTY:  Acct ID - Guarantor Home Phone Work Phone Relationship Acct Type  [id number] Cheryl Atkinson, Cheryl Atkinson* [phone]  Self P/F     [address], [place], [place] [address]     Palliative care SW outreached patient to complete telephonic visit.   Palliative care SW outreached patient to complete telephonic visit. Patient provided update on medical condition and/or changes. Patient shared that she has been doing okay since last in home visit. Patient had recent PCP visit earlier this month and he kept her on hydrocodone for pain and placed a neuro referral.  Patient denies pains at this time. Patient shares that she takes half of hydrocodone in the AM. Headache every AM for about a month, no dizziness or lightheaded. Patient has Tylenol she can take if needed.  No recent falls, she uses her RW for mobility assistance and has a caregiver for standby supervision if needed.   Patients shares that her appetite has been fair. Patient recent weight was 135.  Patient states that she drinks supplement drinks (boost).    Patient shares that her son is checking her BP every morning and it has been high. Patient states that she does take a high BP medication Q AM. SW advised that patient has her caregiver recheck BP mid day as well and document. Patient shares that her family is concerned that she has fluid in her lungs from how her voice sounds. Patient shares that she does not have any swelling currently but that she will take a Lasix today.  SW advised that patient also weigh herself every day for the remainder of the week and document.   Patient denies financial, food insecurities or issues with transportation. No other psychosocial needs. Patient states that she feels she has been  doing well. Patient appreciative of telephonic check in.   Palliative care will continue to monitor and assist with long term care planning as needed.   CODE STATUS: DNR  ADVANCED DIRECTIVES: Yevette Edwards, LCSW

## 2021-06-01 ENCOUNTER — Telehealth: Payer: Self-pay

## 2021-06-01 MED ORDER — APIXABAN 5 MG PO TABS: 5.0000 mg | ORAL_TABLET | Freq: Two times a day (BID) | ORAL | 1 refills | Status: DC

## 2021-06-01 NOTE — Telephone Encounter (Signed)
Spoke to pt told her she should have refills left Rx sent on 12/2020 with 3 refills. Pt said she called [facility] and they told her no refills left. Told pt okay I will send Rx to [facility]. Pt verbalized understanding. Rx sent.

## 2021-06-01 NOTE — Telephone Encounter (Signed)
  LAST APPOINTMENT DATE: 05/09/2021   NEXT APPOINTMENT DATE:@7 /11/2021  MEDICATION: apixaban (ELIQUIS) 5 MG TABS tablet  PHARMACY:[facility]

## 2021-06-07 ENCOUNTER — Other Ambulatory Visit (HOSPITAL_COMMUNITY): Payer: Self-pay | Admitting: Cardiovascular Disease

## 2021-06-07 ENCOUNTER — Telehealth: Payer: Self-pay

## 2021-06-07 DIAGNOSIS — I739 Peripheral vascular disease, unspecified: Secondary | ICD-10-CM

## 2021-06-07 DIAGNOSIS — Z95828 Presence of other vascular implants and grafts: Secondary | ICD-10-CM

## 2021-06-07 DIAGNOSIS — Z9862 Peripheral vascular angioplasty status: Secondary | ICD-10-CM

## 2021-06-07 NOTE — Telephone Encounter (Signed)
Patient called in and stated that she is having trouble standing and yesterday she fell but she is usually in her wheel chair. She would like to a nurse to call her back ASAP she wants to know if she has had a stroke.

## 2021-06-07 NOTE — Telephone Encounter (Signed)
FYI, returned pt call and she complained of her left side being weaker than normal. She states she almost had a fall the other day but her friend was there to catch her and help her back into the wheelchair. She states she is to weak to walk with the walker now. I advised her to have her friend take her to be evaluated in the ER and pt agreed.

## 2021-06-12 ENCOUNTER — Ambulatory Visit (INDEPENDENT_AMBULATORY_CARE_PROVIDER_SITE_OTHER): Payer: Medicare Other | Admitting: *Deleted

## 2021-06-12 DIAGNOSIS — M545 Low back pain, unspecified: Secondary | ICD-10-CM

## 2021-06-12 DIAGNOSIS — I5032 Chronic diastolic (congestive) heart failure: Secondary | ICD-10-CM

## 2021-06-12 DIAGNOSIS — I10 Essential (primary) hypertension: Secondary | ICD-10-CM | POA: Diagnosis not present

## 2021-06-12 DIAGNOSIS — S32020A Wedge compression fracture of second lumbar vertebra, initial encounter for closed fracture: Secondary | ICD-10-CM

## 2021-06-12 NOTE — Chronic Care Management (AMB) (Signed)
Chronic Care Management   CCM RN Visit Note  06/12/2021 Name: Cheryl Atkinson MRN: [id number] DOB: 1931-06-15  Subjective: Cheryl Atkinson is a 87 y.o. year old female who is a primary care patient of Yong Channel, Brayton Mars, MD. The care management team was consulted for assistance with disease management and care coordination needs.    Engaged with patient by telephone for initial visit in response to provider referral for case management and/or care coordination services.   Consent to Services:  The patient was given the following information about Chronic Care Management services today, agreed to services, and gave verbal consent: 1. CCM service includes personalized support from designated clinical staff supervised by the primary care provider, including individualized plan of care and coordination with other care providers 2. 24/7 contact phone numbers for assistance for urgent and routine care needs. 3. Service will only be billed when office clinical staff spend 20 minutes or more in a month to coordinate care. 4. Only one practitioner may furnish and bill the service in a calendar month. 5.The patient may stop CCM services at any time (effective at the end of the month) by phone call to the office staff. 6. The patient will be responsible for cost sharing (co-pay) of up to 20% of the service fee (after annual deductible is met). Patient agreed to services and consent obtained.  Patient agreed to services and verbal consent obtained.   Assessment: Review of patient past medical history, allergies, medications, health status, including review of consultants reports, laboratory and other test data, was performed as part of comprehensive evaluation and provision of chronic care management services.   SDOH (Social Determinants of Health) assessments and interventions performed:  SDOH Interventions    Flowsheet Row Most Recent Value  SDOH Interventions   Food Insecurity Interventions Intervention  Not Indicated  Financial Strain Interventions Intervention Not Indicated  Housing Interventions Intervention Not Indicated  Intimate Partner Violence Interventions Intervention Not Indicated  Transportation Interventions Intervention Not Indicated        CCM Care Plan  Allergies  Allergen Reactions   Codeine     Hallucinations     Codeine Other (See Comments)    Syncope     Outpatient Encounter Medications as of 06/12/2021  Medication Sig Note   Albuterol Sulfate (PROAIR RESPICLICK) 633 (90 Base) MCG/ACT AEPB Inhale 2 puffs into the lungs every 6 (six) hours as needed (shortness of breath from COPD).    apixaban (ELIQUIS) 5 MG TABS tablet Take 1 tablet (5 mg total) by mouth 2 (two) times daily.    CALCIUM PO Take by mouth.    Cholecalciferol (VITAMIN D3 PO) Take by mouth.    ferrous sulfate 325 (65 FE) MG tablet Take 325 mg by mouth 2 (two) times a week. Patient takes one tablet on Monday and Friday    furosemide (LASIX) 40 MG tablet Take 1 tablet (40 mg total) by mouth 2 (two) times daily.    HYDROcodone-acetaminophen (NORCO/VICODIN) 5-325 MG tablet Take 1 tablet by mouth every 6 (six) hours as needed for severe pain.    irbesartan (AVAPRO) 300 MG tablet Take 1 tablet (300 mg total) by mouth daily.    LINZESS 145 MCG CAPS capsule Take 145 mcg by mouth every morning. 06/12/2021: Takes as needed   Multiple Vitamins-Minerals (PRESERVISION AREDS 2 PO) Take 1 tablet by mouth 2 (two) times daily.    polyethylene glycol (MIRALAX / GLYCOLAX) 17 g packet Take 17 g by mouth 2 (two) times daily.  Reported on 03/14/2016 (Patient taking differently: Take 17 g by mouth daily as needed for mild constipation.)    potassium chloride SA (KLOR-CON) 20 MEQ tablet Take 1 tablet (20 mEq total) by mouth 2 (two) times daily as needed. Needs to be taken  With each dose of lasix    PROAIR RESPICLICK 500 (90 Base) MCG/ACT AEPB Inhale 2 puffs into the lungs every 6 (six) hours as needed for shortness of breath.     rosuvastatin (CRESTOR) 40 MG tablet Take 1 tablet (40 mg total) by mouth daily. Take 1 tablet by mouth daily.    Rotigotine (NEUPRO) 3 MG/24HR PT24 PLACE 1 PATCH (3 MG) ONTO THE SKIN AT BEDTIME    traZODone (DESYREL) 50 MG tablet Take 1.5 tablets (75 mg total) by mouth at bedtime as needed for sleep.    vitamin B-12 (CYANOCOBALAMIN) 1000 MCG tablet Take 1,000 mcg by mouth daily.    No facility-administered encounter medications on file as of 06/12/2021.    Patient Active Problem List   Diagnosis Date Noted   B12 deficiency 02/27/2021   Pleural effusion due to CHF (congestive heart failure) ([place]) 05/26/2020   S/P laparoscopic cholecystectomy 04/04/2020   Heart failure with preserved ejection fraction ([place]) 03/01/2020   Insomnia 03/01/2020   Bacteremia 01/28/2020   Atrial fibrillation ([place]) 01/25/2020   Abdominal aortic ectasia ([place]) 12/29/2019   Major depression 03/25/2018   Cat bite of right hand 12/21/2016   BPPV (benign paroxysmal positional vertigo) 07/12/2016   Memory loss 04/11/2016   Mallet toe of right foot 10/20/2015   Renal artery stenosis ([place]) 09/27/2015   Hyperlipidemia 06/30/2015   Claudication ([place]) 05/04/2015   Atherosclerotic PVD with intermittent claudication ([place]) 04/26/2015   Tinnitus 12/31/2014   Former smoker 09/29/2014   DNR (do not resuscitate) 09/29/2014   Hyperglycemia 07/19/2014   Multinodular goiter 08/30/2013   Spinal stenosis of lumbar region at multiple levels 09/29/2012   HIP PAIN, BILATERAL 07/16/2010   COPD (chronic obstructive pulmonary disease) ([place]) 09/20/2009   CONSTIPATION, CHRONIC 09/20/2009   Iron deficiency anemia 11/09/2007   History of UTI 11/09/2007   RESTLESS LEG SYNDROME 09/25/2007   Essential hypertension 09/25/2007   GERD 09/25/2007   LOW BACK PAIN 09/25/2007   Osteoporosis 09/25/2007    Conditions to be addressed/monitored:CHF, HTN, and Pain  Care Plan : Nonspecific Low Back Pain (Adult)  Updates made by Leona Singleton, RN since 06/12/2021 12:00 AM     Problem: Chronic Low Back Pain   Priority: Medium     Long-Range Goal: Chronic Low Back Pain Managed   Start Date: 06/12/2021  Expected End Date: 12/31/2021  Priority: Medium  Note:   Current Barriers:  Knowledge Deficits related to self-health management of acute or chronic pain; Patient with fall in February with resulting compression fracture.  Reports continued pain being managed by hydrocodone every other day.  Denies falls since February.  Does report episode of weakness last week, where she could not move independently, unable to use walker, and needed wheelchair.  Patient did not seek medical attention as instructed, but states after about 2 days she was back to her baseline.  Able to walk with walker and minimal assistance. Chronic Disease Management support and education needs related to chronic pain Unable to perform ADLs independently and Unable to perform IADLs independently; patient has care taker 6 days a week, 12 hours a day whom assist with ADLs and IADLs Clinical Goal(s):  patient will verbalize understanding of plan for pain  management. , patient will demonstrate use of different relaxation  skills and/or diversional activities to assist with pain reduction (distraction, imagery, relaxation, massage, acupressure, TENS, heat, and cold application., patient will use pharmacological and nonpharmacological pain relief strategies as prescribed. , and patient will verbalize acceptable level of pain relief and ability to engage in desired activities Interventions:  Collaboration with Marin Olp, MD regarding development and update of comprehensive plan of care as evidenced by provider attestation and co-signature Pain assessment performed Medications reviewed and compliance encouraged Discussed plans with patient for ongoing care management follow up and provided patient with direct contact information for care management  team Evaluation of current treatment plan related to back pain and patient's adherence to plan as established by provider. Discussed and instructed patient to call provider office for new concerns or questions Encouraged careful application of heat or ice encouraged Discussed complementary therapy use and encouraged Misuse of pain medication assessed Mutually acceptable comfort goal set Pain management plan developed and pain treatment goals reviewed Participation in rehabilitation therapy encouraged Patient response to treatment assessed Premedication prior to activity encouraged Fall precautions and preventions reviewed and discussed; encouraged to use rolling walker with all ambulation Discussed recent weakness and inability to walk using walker; now resolved patient able to ambulate with minimal assistance; encouraged if symptoms return to seek medical attention right away Encouraged use of multimodal interventions, such as physical therapy, cognitive behavior therapy, massage, distraction, yoga, relaxation, chiropractic manipulation, herbal supplement, acupuncture, application of heat or cold. Patient Goals/Self Care Activities:  Call for medicine refill 2 or 3 days before it runs out Develop a personal pain management plan Plan exercise or activity when pain is best controlled Prioritize tasks for the day; Fall precautions and preventions Increase activity as tolerated, using walker with all ambulation Track times pain is worst and when it is best Track what makes the pain worse and what makes it better Work slower and less intense when having pain  Seek medical attention right away if you experience unilateral weakness, inability to walk Follow Up Plan: The care management team will reach out to the patient again over the next 30 business days.     Care Plan : Heart Failure (Adult)  Updates made by Leona Singleton, RN since 06/12/2021 12:00 AM     Problem: Symptom Exacerbation  (Heart Failure)   Priority: Medium     Long-Range Goal: Symptom Exacerbation Prevented or Minimized   Start Date: 06/12/2021  Expected End Date: 12/31/2021  Priority: Medium  Note:   Current Barriers:  Knowledge deficits related to basic heart failure pathophysiology and self care management; does not weigh daily at home.  Denies any lower extremity edema; does endorse some shortness of breath with activity.  Reports needing to use rescue inhaler about twice in the last month.  Reports compliance with fluid medication.  Blood pressure 140/70 with recent ranges of 130-150/60-70's. Nurse Case Manager Clinical Goal(s):  patient will weigh self daily and record patient will verbalize understanding of Heart Failure Action Plan and when to call doctor patient will take all Heart Failure mediations as prescribed Interventions:  Collaboration with Yong Channel Brayton Mars, MD regarding development and update of comprehensive plan of care as evidenced by provider attestation and co-signature Inter-disciplinary care team collaboration (see longitudinal plan of care) Basic overview and discussion of pathophysiology of Heart Failure Provided written and verbal education on low sodium diet Reviewed Heart Failure Action Plan and encouraged patient to verbalizes understanding of and follows  CHF Action Plan Assessed for scales in home Discussed importance of daily weight and when to call provider based on weight Encouraged to continue to work with Palliative Care at home Reviewed role of diuretics in prevention of fluid overload Barriers to lifestyle changes reviewed and addressed and barriers to treatment reviewed and addressed Cognitive screening completed and reviewed Depression screen reviewed and healthy lifestyle promoted Self-awareness of signs/symptoms of worsening disease encouraged   Encouraged to weigh daily and record (notifying MD of 3 lb weight gain over night or 5 lb in a week)   Reviewed and  encouraged patient to adhere to low sodium diet  Encouraged to monitor blood pressures and keep log for provider review Patient Goals/Self-Care Activities:   Call office if I gain more than 2 pounds in one day or 5 pounds in one week Keep legs up while sitting Track weight in diary Use salt in moderation Watch for swelling in feet, ankles and legs every day Weigh myself daily  Check blood pressure daily logging for provider review Follow Up Plan: The care management team will reach out to the patient again over the next 30 business days.       Plan:The care management team will reach out to the patient again over the next 30 business days.  Hubert Azure RN, MSN RN Care Management Coordinator  [facility] [phone] [email or web address]

## 2021-06-12 NOTE — Patient Instructions (Signed)
Visit Information   PATIENT GOALS:   Goals Addressed             This Visit's Progress    (RNCM) Keep Low Back Pain Under Control       Timeframe:  Short-Term Goal Priority:  Medium Start Date:    06/12/21                         Expected End Date: 12/31/21                      Follow Up Date 07/12/21    Call for medicine refill 2 or 3 days before it runs out Develop a personal pain management plan Plan exercise or activity when pain is best controlled Prioritize tasks for the day; Fall precautions and preventions Increase activity as tolerated, using walker with all ambulation Track times pain is worst and when it is best Track what makes the pain worse and what makes it better Work slower and less intense when having pain  Seek medical attention right away if you experience unilateral weakness, inability to walk   Why is this important?   Day-to-day life can be hard when you have back pain.  Pain medicine is just one piece of the treatment puzzle. There are many things you can do to manage pain and keep your back strong.   Lifestyle changes, like stopping smoking and eating foods with Vitamin D and calcium, keep your bones and muscles healthy. Your back is better when it is supported by strong muscles.  You can try these action steps to help you manage your pain.     Notes:       (RNCM) Track and Manage Fluids and Swelling-Heart Failure       Timeframe:  Long-Range Goal Priority:  Medium Start Date:   06/12/21                          Expected End Date:  12/31/21                     Follow Up Date 07/12/21    Call office if I gain more than 2 pounds in one day or 5 pounds in one week Keep legs up while sitting Track weight in diary Use salt in moderation Watch for swelling in feet, ankles and legs every day Weigh myself daily  Check blood pressure daily logging for provider review   Why is this important?   It is important to check your weight daily and watch how  much salt and liquids you have.  It will help you to manage your heart failure.    Notes:          Consent to CCM Services: Cheryl Atkinson was given information about Chronic Care Management services today including:  CCM service includes personalized support from designated clinical staff supervised by her physician, including individualized plan of care and coordination with other care providers 24/7 contact phone numbers for assistance for urgent and routine care needs. Service will only be billed when office clinical staff spend 20 minutes or more in a month to coordinate care. Only one practitioner may furnish and bill the service in a calendar month. The patient may stop CCM services at any time (effective at the end of the month) by phone call to the office staff. The patient will be responsible for cost sharing (  co-pay) of up to 20% of the service fee (after annual deductible is met).  Patient agreed to services and verbal consent obtained.   The patient verbalized understanding of instructions, educational materials, and care plan provided today and declined offer to receive copy of patient instructions, educational materials, and care plan.   The care management team will reach out to the patient again over the next 30 business days.   Hubert Azure RN, MSN RN Care Management Coordinator  [facility] [phone] [email or web address]   CLINICAL CARE PLAN: Patient Care Plan: Nonspecific Low Back Pain (Adult)     Problem Identified: Chronic Low Back Pain   Priority: Medium     Long-Range Goal: Chronic Low Back Pain Managed   Start Date: 06/12/2021  Expected End Date: 12/31/2021  Priority: Medium  Note:   Current Barriers:  Knowledge Deficits related to self-health management of acute or chronic pain; Patient with fall in February with resulting compression fracture.  Reports continued pain being managed by hydrocodone every other day.   Denies falls since February.  Does report episode of weakness last week, where she could not move independently, unable to use walker, and needed wheelchair.  Patient did not seek medical attention as instructed, but states after about 2 days she was back to her baseline.  Able to walk with walker and minimal assistance. Chronic Disease Management support and education needs related to chronic pain Unable to perform ADLs independently and Unable to perform IADLs independently; patient has care taker 6 days a week, 12 hours a day whom assist with ADLs and IADLs Clinical Goal(s):  patient will verbalize understanding of plan for pain management. , patient will demonstrate use of different relaxation  skills and/or diversional activities to assist with pain reduction (distraction, imagery, relaxation, massage, acupressure, TENS, heat, and cold application., patient will use pharmacological and nonpharmacological pain relief strategies as prescribed. , and patient will verbalize acceptable level of pain relief and ability to engage in desired activities Interventions:  Collaboration with Marin Olp, MD regarding development and update of comprehensive plan of care as evidenced by provider attestation and co-signature Pain assessment performed Medications reviewed and compliance encouraged Discussed plans with patient for ongoing care management follow up and provided patient with direct contact information for care management team Evaluation of current treatment plan related to back pain and patient's adherence to plan as established by provider. Discussed and instructed patient to call provider office for new concerns or questions Encouraged careful application of heat or ice encouraged Discussed complementary therapy use and encouraged Misuse of pain medication assessed Mutually acceptable comfort goal set Pain management plan developed and pain treatment goals reviewed Participation in  rehabilitation therapy encouraged Patient response to treatment assessed Premedication prior to activity encouraged Fall precautions and preventions reviewed and discussed; encouraged to use rolling walker with all ambulation Discussed recent weakness and inability to walk using walker; now resolved patient able to ambulate with minimal assistance; encouraged if symptoms return to seek medical attention right away Encouraged use of multimodal interventions, such as physical therapy, cognitive behavior therapy, massage, distraction, yoga, relaxation, chiropractic manipulation, herbal supplement, acupuncture, application of heat or cold. Patient Goals/Self Care Activities:  Call for medicine refill 2 or 3 days before it runs out Develop a personal pain management plan Plan exercise or activity when pain is best controlled Prioritize tasks for the day; Fall precautions and preventions Increase activity as tolerated, using walker with all ambulation Track times pain is worst and  when it is best Track what makes the pain worse and what makes it better Work slower and less intense when having pain  Seek medical attention right away if you experience unilateral weakness, inability to walk Follow Up Plan: The care management team will reach out to the patient again over the next 30 business days.     Patient Care Plan: Heart Failure (Adult)     Problem Identified: Symptom Exacerbation (Heart Failure)   Priority: Medium     Long-Range Goal: Symptom Exacerbation Prevented or Minimized   Start Date: 06/12/2021  Expected End Date: 12/31/2021  Priority: Medium  Note:   Current Barriers:  Knowledge deficits related to basic heart failure pathophysiology and self care management; does not weigh daily at home.  Denies any lower extremity edema; does endorse some shortness of breath with activity.  Reports needing to use rescue inhaler about twice in the last month.  Reports compliance with fluid  medication.  Blood pressure 140/70 with recent ranges of 130-150/60-70's. Nurse Case Manager Clinical Goal(s):  patient will weigh self daily and record patient will verbalize understanding of Heart Failure Action Plan and when to call doctor patient will take all Heart Failure mediations as prescribed Interventions:  Collaboration with Marin Olp, MD regarding development and update of comprehensive plan of care as evidenced by provider attestation and co-signature Inter-disciplinary care team collaboration (see longitudinal plan of care) Basic overview and discussion of pathophysiology of Heart Failure Provided written and verbal education on low sodium diet Reviewed Heart Failure Action Plan and encouraged patient to verbalizes understanding of and follows CHF Action Plan Assessed for scales in home Discussed importance of daily weight and when to call provider based on weight Encouraged to continue to work with Palliative Care at home Reviewed role of diuretics in prevention of fluid overload Barriers to lifestyle changes reviewed and addressed and barriers to treatment reviewed and addressed Cognitive screening completed and reviewed Depression screen reviewed and healthy lifestyle promoted Self-awareness of signs/symptoms of worsening disease encouraged   Encouraged to weigh daily and record (notifying MD of 3 lb weight gain over night or 5 lb in a week)   Reviewed and encouraged patient to adhere to low sodium diet  Encouraged to monitor blood pressures and keep log for provider review Patient Goals/Self-Care Activities:   Call office if I gain more than 2 pounds in one day or 5 pounds in one week Keep legs up while sitting Track weight in diary Use salt in moderation Watch for swelling in feet, ankles and legs every day Weigh myself daily  Check blood pressure daily logging for provider review Follow Up Plan: The care management team will reach out to the patient again  over the next 30 business days.

## 2021-06-18 ENCOUNTER — Inpatient Hospital Stay (HOSPITAL_COMMUNITY): Admission: RE | Admit: 2021-06-18 | Payer: Medicare Other | Source: Ambulatory Visit

## 2021-06-21 ENCOUNTER — Encounter: Payer: Self-pay | Admitting: Family

## 2021-06-21 ENCOUNTER — Ambulatory Visit (INDEPENDENT_AMBULATORY_CARE_PROVIDER_SITE_OTHER): Payer: Medicare Other | Admitting: Family

## 2021-06-21 ENCOUNTER — Other Ambulatory Visit: Payer: Self-pay

## 2021-06-21 VITALS — BP 178/98 | HR 69 | Temp 97.0°F | Ht 66.0 in | Wt 138.9 lb

## 2021-06-21 DIAGNOSIS — L03115 Cellulitis of right lower limb: Secondary | ICD-10-CM

## 2021-06-21 DIAGNOSIS — I10 Essential (primary) hypertension: Secondary | ICD-10-CM | POA: Diagnosis not present

## 2021-06-21 DIAGNOSIS — W548XXA Other contact with dog, initial encounter: Secondary | ICD-10-CM | POA: Diagnosis not present

## 2021-06-21 DIAGNOSIS — I701 Atherosclerosis of renal artery: Secondary | ICD-10-CM

## 2021-06-21 MED ORDER — AMOXICILLIN-POT CLAVULANATE 875-125 MG PO TABS: 1.0000 | ORAL_TABLET | Freq: Two times a day (BID) | ORAL | 0 refills | Status: DC

## 2021-06-21 MED ORDER — AMLODIPINE BESYLATE 5 MG PO TABS: 5.0000 mg | ORAL_TABLET | Freq: Every day | ORAL | 1 refills | Status: DC

## 2021-06-21 NOTE — Patient Instructions (Signed)

## 2021-06-21 NOTE — Progress Notes (Signed)
Acute Office Visit  Subjective:    Patient ID: Cheryl Atkinson, female    DOB: [birth year], 87 y.o.   MRN: [id number]  Chief Complaint  Patient presents with   Animal injury    PT states that dog scratched her right chin. 3 days ago.   Leg Pain    HPI Patient is in today with c/o a dog scratch to the right lower leg.  This occurred approximately 3 days ago.  This is her dog and his shots are up-to-date.  Unprovoked, playful interaction.  Now her right lower leg is red, tender, and beginning to swell.  Reports seeing pus yesterday.  Denies any fever or chills.  Today, patient's blood pressure is elevated.  Caregiver reports that blood pressure readings at home have been elevated as well typically in the 170s to 180s systolically.  Patient reports headaches that she typically does not get.  No blurred vision, double vision, chest pain, shortness of breath.  She takes her medications every day.  Past Medical History:  Diagnosis Date   Acute cholecystitis 12/29/2019   Anemia    Arthritis    "shoulders" (05/04/2015)   Cellulitis of right lower extremity 11/27/2013   CHF (congestive heart failure) (HCC)    Chronic lower back pain    Constipation    COPD (chronic obstructive pulmonary disease) (HCC)    GERD (gastroesophageal reflux disease)    History of hiatal hernia    HLD (hyperlipidemia)    Hypertension    Insomnia    Osteoporosis    Peripheral arterial disease (HCC)    Restless leg syndrome    Thyroid disease     Past Surgical History:  Procedure Laterality Date   ABDOMINAL AORTAGRAM  05/04/2015   Procedure: Abdominal Aortagram;  Surgeon: Runell Gess, MD;  Location: [facility] INVASIVE CV LAB;  Service: Cardiovascular;;   APPENDECTOMY     CATARACT EXTRACTION W/ INTRAOCULAR LENS  IMPLANT, BILATERAL Bilateral    CHOLECYSTECTOMY N/A 04/04/2020   Procedure: LAPAROSCOPIC CHOLECYSTECTOMY;  Surgeon: Violeta Gelinas, MD;  Location: [facility] OR;  Service: General;  Laterality: N/A;   I & D  EXTREMITY Right 12/22/2016   Procedure: IRRIGATION AND DEBRIDEMENT EXTREMITY;  Surgeon: Mack Hook, MD;  Location: [facility] OR;  Service: Orthopedics;  Laterality: Right;   IR EXCHANGE BILIARY DRAIN  03/13/2020   IR PATIENT EVAL TECH 0-60 MINS  12/30/2019   IR PERC CHOLECYSTOSTOMY  01/27/2020   IR RADIOLOGIST EVAL & MGMT  05/16/2020   PERIPHERAL VASCULAR CATHETERIZATION N/A 05/04/2015   Procedure: Lower Extremity Angiography;  Surgeon: Runell Gess, MD;  Location: [facility] INVASIVE CV LAB;  Service: Cardiovascular;  Laterality: N/A;   PERIPHERAL VASCULAR CATHETERIZATION  05/04/2015   Procedure: Peripheral Vascular Intervention;  Surgeon: Runell Gess, MD;  Location: [facility] INVASIVE CV LAB;  Service: Cardiovascular;;  RCIA - 7x22 ICAST   PERIPHERAL VASCULAR CATHETERIZATION Right 09/04/2015   Procedure: Peripheral Vascular Atherectomy;  Surgeon: Runell Gess, MD;  Location: [facility] INVASIVE CV LAB;  Service: Cardiovascular;  Laterality: Right;  SFA   sfa Right 09/04/2015   de balloon   THYROID SURGERY Right ?2013   "had goiter taken off my neck"   TONSILLECTOMY     VAGINAL HYSTERECTOMY      Family History  Problem Relation Age of Onset   Heart disease Father    Heart attack Father    Heart disease Mother    Coronary artery disease Other    Atrial fibrillation Sister  Stroke Maternal Grandmother    Cancer Paternal Grandmother     Social History   Socioeconomic History   Marital status: Widowed    Spouse name: Not on file   Number of children: Not on file   Years of education: Not on file   Highest education level: Not on file  Occupational History   Occupation: retired  Tobacco Use   Smoking status: Former    Packs/day: 0.50    Years: 60.00    Pack years: 30.00    Types: Cigarettes    Quit date: 04/02/2015    Years since quitting: 6.2   Smokeless tobacco: Never  Vaping Use   Vaping Use: Never used  Substance and Sexual Activity   Alcohol use: No   Drug use: No   Sexual activity:  Not Currently  Other Topics Concern   Not on file  Social History Narrative   ** Merged History Encounter **       Widowed 2013. 2 sons. 1 grandchild.  Son can help on weekends. Sister and sister in law could help as well. Thinks she may stop driving in next few years.   Retired from [employer].   Hobbies: time at home and with family  HCPOA: sister-in-law a   nd brother. Deirdre EvenerStephen Sumner.   DNR/DNI  Regular exercise: none Caffeine use: cup of coffee    Social Determinants of Health   Financial Resource Strain: Low Risk    Difficulty of Paying Living Expenses: Not hard at all  Food Insecurity: No Food Insecurity   Worried About Programme researcher, broadcasting/film/videounning Out of Food in the Last Year: Never true   Ran Out of Food in the Last Year: Never true  Transportation Needs: No Transportation Needs   Lack of Transportation (Medical): No   Lack of Transportation (Non-Medical): No  Physical Activity: Unknown   Days of Exercise per Week: 1 day   Minutes of Exercise per Session: Not on file  Stress: No Stress Concern Present   Feeling of Stress : Not at all  Social Connections: Moderately Isolated   Frequency of Communication with Friends and Family: More than three times a week   Frequency of Social Gatherings with Friends and Family: Twice a week   Attends Religious Services: More than 4 times per year   Active Member of [employer] or Organizations: No   Attends BankerClub or Organization Meetings: Never   Marital Status: Widowed  Catering managerntimate Partner Violence: Not At Risk   Fear of Current or Ex-Partner: No   Emotionally Abused: No   Physically Abused: No   Sexually Abused: No    Outpatient Medications Prior to Visit  Medication Sig Dispense Refill   Albuterol Sulfate (PROAIR RESPICLICK) 108 (90 Base) MCG/ACT AEPB Inhale 2 puffs into the lungs every 6 (six) hours as needed (shortness of breath from COPD). 1 each 5   apixaban (ELIQUIS) 5 MG TABS tablet Take 1 tablet (5 mg total) by mouth 2 (two) times daily.  180 tablet 1   CALCIUM PO Take by mouth.     Cholecalciferol (VITAMIN D3 PO) Take by mouth.     ferrous sulfate 325 (65 FE) MG tablet Take 325 mg by mouth 2 (two) times a week. Patient takes one tablet on Monday and Friday     furosemide (LASIX) 40 MG tablet Take 1 tablet (40 mg total) by mouth 2 (two) times daily. 180 tablet 3   HYDROcodone-acetaminophen (NORCO/VICODIN) 5-325 MG tablet Take 1 tablet by mouth every 6 (six)  hours as needed for severe pain. 20 tablet 0   irbesartan (AVAPRO) 300 MG tablet Take 1 tablet (300 mg total) by mouth daily. 90 tablet 3   LINZESS 145 MCG CAPS capsule Take 145 mcg by mouth every morning.     Multiple Vitamins-Minerals (PRESERVISION AREDS 2 PO) Take 1 tablet by mouth 2 (two) times daily.     polyethylene glycol (MIRALAX / GLYCOLAX) 17 g packet Take 17 g by mouth 2 (two) times daily. Reported on 03/14/2016 (Patient taking differently: Take 17 g by mouth daily as needed for mild constipation.)     potassium chloride SA (KLOR-CON) 20 MEQ tablet Take 1 tablet (20 mEq total) by mouth 2 (two) times daily as needed. Needs to be taken  With each dose of lasix 180 tablet 3   PROAIR RESPICLICK 108 (90 Base) MCG/ACT AEPB Inhale 2 puffs into the lungs every 6 (six) hours as needed for shortness of breath.     rosuvastatin (CRESTOR) 40 MG tablet Take 1 tablet (40 mg total) by mouth daily. Take 1 tablet by mouth daily. 90 tablet 3   Rotigotine (NEUPRO) 3 MG/24HR PT24 PLACE 1 PATCH (3 MG) ONTO THE SKIN AT BEDTIME 90 patch 3   traZODone (DESYREL) 50 MG tablet Take 1.5 tablets (75 mg total) by mouth at bedtime as needed for sleep. 135 tablet 3   vitamin B-12 (CYANOCOBALAMIN) 1000 MCG tablet Take 1,000 mcg by mouth daily.     No facility-administered medications prior to visit.    Allergies  Allergen Reactions   Codeine     Hallucinations     Codeine Other (See Comments)    Syncope     Review of Systems  Constitutional:  Negative for chills, fatigue and fever.   Respiratory: Negative.    Cardiovascular: Negative.        Blood pressure elevated   Musculoskeletal: Negative.   Skin:  Positive for wound.       Right lower leg  Neurological:  Positive for headaches. Negative for dizziness and light-headedness.  Psychiatric/Behavioral: Negative.    All other systems reviewed and are negative.     Objective:    Physical Exam Vitals reviewed.  Constitutional:      Appearance: Normal appearance.  HENT:     Right Ear: Tympanic membrane normal.     Left Ear: Tympanic membrane normal.  Cardiovascular:     Rate and Rhythm: Normal rate.  Pulmonary:     Effort: Pulmonary effort is normal.     Breath sounds: Normal breath sounds.  Abdominal:     General: Abdomen is flat.  Musculoskeletal:        General: Normal range of motion.     Cervical back: Normal range of motion and neck supple.       Legs:  Skin:    General: Skin is warm and dry.  Neurological:     General: No focal deficit present.     Mental Status: She is alert and oriented to person, place, and time.     Motor: No weakness.     Coordination: Coordination normal.     Deep Tendon Reflexes: Reflexes normal.  Psychiatric:        Mood and Affect: Mood normal.        Behavior: Behavior normal.   BP (!) 178/98   Pulse 69   Temp (!) 97 F (36.1 C) (Temporal)   Ht 5\' 6"  (1.676 m)   Wt 138 lb 14.2 oz (63 kg)  LMP  (LMP Unknown)   SpO2 98%   BMI 22.42 kg/m  Wt Readings from Last 3 Encounters:  06/21/21 138 lb 14.2 oz (63 kg)  05/09/21 139 lb (63 kg)  04/04/21 138 lb 9.6 oz (62.9 kg)    Health Maintenance Due  Topic Date Due   Zoster Vaccines- Shingrix (1 of 2) Never done   HEMOGLOBIN A1C  06/03/2021   FOOT EXAM  06/06/2021    There are no preventive care reminders to display for this patient.   Lab Results  Component Value Date   TSH 1.39 02/11/2020   Lab Results  Component Value Date   WBC 6.4 04/04/2021   HGB 10.9 (L) 04/04/2021   HCT 33.2 (L) 04/04/2021    MCV 88.3 04/04/2021   PLT 213.0 04/04/2021   Lab Results  Component Value Date   NA 140 04/04/2021   K 3.5 04/04/2021   CO2 29 04/04/2021   GLUCOSE 96 04/04/2021   BUN 21 04/04/2021   CREATININE 0.89 04/04/2021   BILITOT 0.6 04/04/2021   ALKPHOS 128 (H) 04/04/2021   AST 14 04/04/2021   ALT 10 04/04/2021   PROT 6.8 04/04/2021   ALBUMIN 3.8 04/04/2021   CALCIUM 8.8 04/04/2021   ANIONGAP 10 01/14/2021   GFR 58.22 (L) 04/04/2021   Lab Results  Component Value Date   CHOL 169 12/04/2020   Lab Results  Component Value Date   HDL 80.50 12/04/2020   Lab Results  Component Value Date   LDLCALC 78 12/04/2020   Lab Results  Component Value Date   TRIG 54.0 12/04/2020   Lab Results  Component Value Date   CHOLHDL 2 12/04/2020   Lab Results  Component Value Date   HGBA1C 6.2 12/04/2020       Assessment & Plan:   Problem List Items Addressed This Visit   None Visit Diagnoses     Dog scratch    -  Primary   Cellulitis of right leg       Uncontrolled hypertension       Relevant Medications   amLODipine (NORVASC) 5 MG tablet        Meds ordered this encounter  Medications   amoxicillin-clavulanate (AUGMENTIN) 875-125 MG tablet    Sig: Take 1 tablet by mouth 2 (two) times daily.    Dispense:  20 tablet    Refill:  0   amLODipine (NORVASC) 5 MG tablet    Sig: Take 1 tablet (5 mg total) by mouth daily.    Dispense:  30 tablet    Refill:  1   Plan: Augmentin 875 twice a day x10 days.  Call the office with any questions or concerns.  Start amlodipine 5 mg once daily for better blood pressure control.  Continue current medications.  Low-sodium diet.  Follow-up with Dr. Durene Cal as scheduled next month and sooner as needed.  Eulis Foster, FNP

## 2021-06-25 ENCOUNTER — Other Ambulatory Visit: Payer: Medicare Other

## 2021-06-25 ENCOUNTER — Other Ambulatory Visit: Payer: Self-pay

## 2021-06-25 DIAGNOSIS — Z515 Encounter for palliative care: Secondary | ICD-10-CM

## 2021-06-25 NOTE — Progress Notes (Signed)
PATIENT NAME: Cheryl Atkinson DOB: 06-06-1931 MRN: [id number]  PRIMARY CARE PROVIDER: Shelva Majestic, MD  RESPONSIBLE PARTY:  Acct ID - Guarantor Home Phone Work Phone Relationship Acct Type  [id number] SARAI, JANUARY* [phone]  Self P/F     [address], [place], [place] [address]   TELEHEALTH VISIT STATEMENT Due to the COVID-19 crisis, this visit was done via telemedicine from my office and it was initiated and consent by this patient and or family.  PLAN OF CARE and INTERVENTIONS:               1.  GOALS OF CARE/ ADVANCE CARE PLANNING:  Remain home under the care of her son and private caregiver.               2.  PATIENT/CAREGIVER EDUCATION:  Blood pressure log               4. PERSONAL EMERGENCY PLAN:  Activate 911 for emergencies.                5.  DISEASE STATUS:  Visit completed telephonically with patient.  Patient continues to ambulate with a rolling walker.  Patient denies any recent falls. She reports not going out as often but enjoys going to church when she is able too. Patient is taking norco for back pain related to a fall earlier this year.  Patient states she is only taking as needed.  Patient denies issues with insomnia.  She is currently taking trazodone to aide is sleeping.  Patient was recently started on Norvasc for elevated blood pressure.  Patient states her readings are better but unable to recall her last reading.  I have encouraged patient to maintain a blood pressure log to share with her PCP on her next visit.  Appetite remains good and patient does not notice any issues with weight loss.    No new concerns voiced by patient.   Palliative Care will continue to follow patient monthly and PRN.  CODE STATUS: DNR ADVANCED DIRECTIVES: No MOST FORM: Yes PPS: 50%        Truitt Merle, RN

## 2021-06-26 ENCOUNTER — Encounter (INDEPENDENT_AMBULATORY_CARE_PROVIDER_SITE_OTHER): Payer: Medicare Other | Admitting: Ophthalmology

## 2021-06-26 ENCOUNTER — Other Ambulatory Visit: Payer: Self-pay

## 2021-06-26 DIAGNOSIS — H35033 Hypertensive retinopathy, bilateral: Secondary | ICD-10-CM | POA: Diagnosis not present

## 2021-06-26 DIAGNOSIS — H43813 Vitreous degeneration, bilateral: Secondary | ICD-10-CM

## 2021-06-26 DIAGNOSIS — H353112 Nonexudative age-related macular degeneration, right eye, intermediate dry stage: Secondary | ICD-10-CM

## 2021-06-26 DIAGNOSIS — I10 Essential (primary) hypertension: Secondary | ICD-10-CM

## 2021-06-26 DIAGNOSIS — H353221 Exudative age-related macular degeneration, left eye, with active choroidal neovascularization: Secondary | ICD-10-CM

## 2021-07-05 ENCOUNTER — Other Ambulatory Visit: Payer: Self-pay

## 2021-07-05 ENCOUNTER — Ambulatory Visit (HOSPITAL_BASED_OUTPATIENT_CLINIC_OR_DEPARTMENT_OTHER)
Admission: RE | Admit: 2021-07-05 | Discharge: 2021-07-05 | Disposition: A | Discharge status: Home / Self Care | Payer: Medicare Other | Source: Ambulatory Visit | Attending: Cardiovascular Disease | Admitting: Cardiovascular Disease

## 2021-07-05 ENCOUNTER — Ambulatory Visit (HOSPITAL_COMMUNITY)
Admission: RE | Admit: 2021-07-05 | Discharge: 2021-07-05 | Disposition: A | Discharge status: Home / Self Care | Payer: Medicare Other | Source: Ambulatory Visit | Attending: Cardiology | Admitting: Cardiology

## 2021-07-05 ENCOUNTER — Other Ambulatory Visit (HOSPITAL_COMMUNITY): Payer: Self-pay | Admitting: Cardiovascular Disease

## 2021-07-05 DIAGNOSIS — I739 Peripheral vascular disease, unspecified: Secondary | ICD-10-CM

## 2021-07-05 DIAGNOSIS — Z9862 Peripheral vascular angioplasty status: Secondary | ICD-10-CM | POA: Diagnosis not present

## 2021-07-05 DIAGNOSIS — Z95828 Presence of other vascular implants and grafts: Secondary | ICD-10-CM | POA: Insufficient documentation

## 2021-07-05 DIAGNOSIS — I77811 Abdominal aortic ectasia: Secondary | ICD-10-CM

## 2021-07-12 ENCOUNTER — Ambulatory Visit (INDEPENDENT_AMBULATORY_CARE_PROVIDER_SITE_OTHER): Payer: Medicare Other | Admitting: *Deleted

## 2021-07-12 DIAGNOSIS — M545 Low back pain, unspecified: Secondary | ICD-10-CM

## 2021-07-12 DIAGNOSIS — I10 Essential (primary) hypertension: Secondary | ICD-10-CM | POA: Diagnosis not present

## 2021-07-12 DIAGNOSIS — I5032 Chronic diastolic (congestive) heart failure: Secondary | ICD-10-CM | POA: Diagnosis not present

## 2021-07-12 NOTE — Progress Notes (Addendum)
Phone [phone] In person visit   Subjective:   Cheryl Atkinson is a 87 y.o. year old very pleasant female patient who presents for/with See problem oriented charting Chief Complaint  Patient presents with   Follow-up    This visit occurred during the SARS-CoV-2 public health emergency.  Safety protocols were in place, including screening questions prior to the visit, additional usage of staff PPE, and extensive cleaning of exam room while observing appropriate contact time as indicated for disinfecting solutions.   Past Medical History-  Patient Active Problem List   Diagnosis Date Noted   Pleural effusion due to CHF (congestive heart failure) (HCC) 05/26/2020    Priority: High   Heart failure with preserved ejection fraction (HCC) 03/01/2020    Priority: High   Bacteremia 01/28/2020    Priority: High   Atrial fibrillation (HCC) 01/25/2020    Priority: High   Memory loss 04/11/2016    Priority: High   Renal artery stenosis (HCC) 09/27/2015    Priority: High   Claudication (HCC) 05/04/2015    Priority: High   Atherosclerotic PVD with intermittent claudication (HCC) 04/26/2015    Priority: High   DNR (do not resuscitate) 09/29/2014    Priority: High   Hyperglycemia 07/19/2014    Priority: High   LOW BACK PAIN 09/25/2007    Priority: High   Insomnia 03/01/2020    Priority: Medium   Major depression 03/25/2018    Priority: Medium   BPPV (benign paroxysmal positional vertigo) 07/12/2016    Priority: Medium   Hyperlipidemia 06/30/2015    Priority: Medium   Former smoker 09/29/2014    Priority: Medium   COPD (chronic obstructive pulmonary disease) (HCC) 09/20/2009    Priority: Medium   Iron deficiency anemia 11/09/2007    Priority: Medium   RESTLESS LEG SYNDROME 09/25/2007    Priority: Medium   Essential hypertension 09/25/2007    Priority: Medium   GERD 09/25/2007    Priority: Medium   Osteoporosis 09/25/2007    Priority: Medium   S/P laparoscopic  cholecystectomy 04/04/2020    Priority: Low   Abdominal aortic ectasia (HCC) 12/29/2019    Priority: Low   Cat bite of right hand 12/21/2016    Priority: Low   Mallet toe of right foot 10/20/2015    Priority: Low   Tinnitus 12/31/2014    Priority: Low   Multinodular goiter 08/30/2013    Priority: Low   Spinal stenosis of lumbar region at multiple levels 09/29/2012    Priority: Low   HIP PAIN, BILATERAL 07/16/2010    Priority: Low   CONSTIPATION, CHRONIC 09/20/2009    Priority: Low   History of UTI 11/09/2007    Priority: Low   B12 deficiency 02/27/2021    Medications- reviewed and updated Current Outpatient Medications  Medication Sig Dispense Refill   Albuterol Sulfate (PROAIR RESPICLICK) 108 (90 Base) MCG/ACT AEPB Inhale 2 puffs into the lungs every 6 (six) hours as needed (shortness of breath from COPD). 1 each 5   amLODipine (NORVASC) 5 MG tablet Take 1 tablet (5 mg total) by mouth daily. 30 tablet 1   apixaban (ELIQUIS) 5 MG TABS tablet Take 1 tablet (5 mg total) by mouth 2 (two) times daily. 180 tablet 1   CALCIUM PO Take by mouth.     carbidopa-levodopa (SINEMET IR) 25-100 MG tablet Take daily as needed for worsening RLS. 90 tablet 1   Cholecalciferol (VITAMIN D3 PO) Take by mouth.     ferrous sulfate 325 (65 FE)  MG tablet Take 325 mg by mouth 2 (two) times a week. Patient takes one tablet on Monday and Friday     furosemide (LASIX) 40 MG tablet Take 1 tablet (40 mg total) by mouth 2 (two) times daily. 180 tablet 3   HYDROcodone-acetaminophen (NORCO/VICODIN) 5-325 MG tablet Take 1 tablet by mouth every 6 (six) hours as needed for severe pain. 20 tablet 0   irbesartan (AVAPRO) 300 MG tablet Take 1 tablet (300 mg total) by mouth daily. 90 tablet 3   LINZESS 145 MCG CAPS capsule Take 145 mcg by mouth every morning.     Multiple Vitamins-Minerals (PRESERVISION AREDS 2 PO) Take 1 tablet by mouth 2 (two) times daily.     polyethylene glycol (MIRALAX / GLYCOLAX) 17 g packet Take  17 g by mouth 2 (two) times daily. Reported on 03/14/2016 (Patient taking differently: Take 17 g by mouth daily as needed for mild constipation.)     potassium chloride SA (KLOR-CON) 20 MEQ tablet Take 1 tablet (20 mEq total) by mouth 2 (two) times daily as needed. Needs to be taken  With each dose of lasix 180 tablet 3   PROAIR RESPICLICK 108 (90 Base) MCG/ACT AEPB Inhale 2 puffs into the lungs every 6 (six) hours as needed for shortness of breath.     rosuvastatin (CRESTOR) 40 MG tablet Take 1 tablet (40 mg total) by mouth daily. Take 1 tablet by mouth daily. 90 tablet 3   Rotigotine (NEUPRO) 3 MG/24HR PT24 PLACE 1 PATCH (3 MG) ONTO THE SKIN AT BEDTIME 90 patch 3   traZODone (DESYREL) 50 MG tablet Take 1.5 tablets (75 mg total) by mouth at bedtime as needed for sleep. 135 tablet 3   vitamin B-12 (CYANOCOBALAMIN) 1000 MCG tablet Take 1,000 mcg by mouth daily.     No current facility-administered medications for this visit.     Objective:  BP 132/72   Pulse 65   Temp (!) 97.4 F (36.3 C)   Wt 136 lb 3.2 oz (61.8 kg)   LMP  (LMP Unknown)   SpO2 98%   BMI 21.98 kg/m  Gen: NAD, resting comfortably CV: Irregular irregular, no murmurs rubs or gallops Lungs: CTAB no crackles, wheeze, rhonchi Ext: no edema Skin: warm, dry   Diabetic Foot Exam - Simple   Simple Foot Form Diabetic Foot exam was performed with the following findings: Yes 07/17/2021  8:18 AM  Visual Inspection No deformities, no ulcerations, no other skin breakdown bilaterally: Yes Sensation Testing Intact to touch and monofilament testing bilaterally: Yes Pulse Check Posterior Tibialis and Dorsalis pulse intact bilaterally: Yes Comments Only 1+ pulses DP and PT bilateral        Assessment and Plan   # L2 compression fracture S: last visit patient was still having pain from prior fracture and was using half tablet of hydrocodone every other day- had 4 pills left at that time- was significantly improving at that  time  Has 1-2 pills left- uses half pill sparingly. Riding long distances is worse A/P: prior compression fracture appears to have healed.  She had back pain even before the compression fracture-I do not think her current pain is from the compression fracture.  Sparing refills can be provided on hydrocodone but I really want her to limit this as much as possible. Could consider going back to tramadol -doing some walking with support with walker.  No PT currently  # Atrial fibrillation- new onset 2021 S: Rate controlled without medication  Anticoagulated with  eliquis 5 mg BID. Last cr 0.89.  Patient is  followed by cardiology: Dr. Allyson Sabal  A/P:   Stable. Continue current medications.   Marland Kitchen #CHF with preserved EF #Hypertension S: Compliant with irbesartan 300 mg (ok per cards as only unilateral renal artery stenosis) , amlodipine 5 mg, and lasix 40mg  twice daily -off hctz since starting lasix -off amlodiine march 2021 with edema issues -Blood pressure high in past with caffeine intake Home reading #s: 130s/70s BP Readings from Last 3 Encounters:  07/17/21 132/72  07/16/21 129/76  06/21/21 (!) 178/98  A/P: Hypertension is well controlled-continue current medications.  Congestive heart failure appears stable/euvolemic-continue current medications-update CBC and CMP today  #Major depression S: Patient has denied counseling and medications in the past. Seems to have been helped with staying more engaged with friends and family. Depression screen [facility] 2/9 07/17/2021 06/12/2021 04/04/2021  Decreased Interest 0 0 0  Down, Depressed, Hopeless 0 0 0  PHQ - 2 Score 0 0 0  Altered sleeping 0 - 3  Tired, decreased energy 0 - 3  Change in appetite 0 - 1  Feeling bad or failure about yourself  1 - 1  Trouble concentrating 1 - 0  Moving slowly or fidgety/restless 0 - 0  Suicidal thoughts 0 - 0  PHQ-9 Score 2 - 8  Difficult doing work/chores Not difficult at all - Not difficult at all  Some recent data  might be hidden  A/P: Full remission- continue to monitor without meds  #Restless leg syndrome S: Patient on Neupro 3 mg. Ferritin levels have been low in the past.  Patient saw neurology yesterday-concern for augmentationand was taking much higher doses-they returned to wean her back down and they also started Sinemet A/P: glad she had neuro visit yesterday and adjustments suggested   #Hyperlipidemia/peripheral vascular disease/renal artery stenosis S: Compliant with atorvastatin 40 mg-->rosuvastatin 40mg  with LDL above 70 in early 2022 and also on eliquis (prior plavix/asa prior to a fib). She has known atherosclerotic peripheral vascular disease with intermittent claudication-stable with claudication at approximately 200 feet.  - recent study with Dr. stable ABIs- moderate on right and left. 1 year repeat Lab Results  Component Value Date   CHOL 169 12/04/2020   HDL 80.50 12/04/2020   LDLCALC 78 12/04/2020   LDLDIRECT 55.0 03/15/2020   TRIG 54.0 12/04/2020   CHOLHDL 2 12/04/2020  A/P: Hyperlipidemia mildly poorly controlled on last check-update lipid today with just LDL-goal under 70.  For peripheral vascular disease stable on recent check.  Cardiology is okay with ARB due to renal artery stenosis being unilateral  #Insulin resistance/hyperglycemia S: prior metformin 500 milligrams daily- diarrhea and low b12. No rx Exercise and diet- exerciselimited, trying to eat reaosnably healthy diet Lab Results  Component Value Date   HGBA1C 6.2 12/04/2020   HGBA1C 6.1 (H) 05/29/2020   HGBA1C 5.9 (H) 01/01/2020  A/P: hopefully studable- update a1c. No current rx.   #COPD S: Finds albuterol helpful starting 2020. She had improvement in shortness of breath when she quit smoking. Uses albuterol once a week A/P: overall stable- continue current rx   Recommended follow up: No follow-ups on file. Future Appointments  Date Time Provider Department Center  07/20/2021  9:30 AM [facility]  07/24/2021  8:30 AM 07/22/2021, MD [facility] None  07/31/2021  3:15 PM Sherrie George, MD [facility]  08/09/2021 11:30 AM [facility]  10/17/2021  7:50 AM 10/09/2021,  Donika K, DO [facility] None    Lab/Order associations:   ICD-10-CM   1. Essential hypertension  I10     2. RESTLESS LEG SYNDROME  G25.81     3. Pleural effusion due to CHF (congestive heart failure) (HCC)  I50.9     4. Recurrent major depressive disorder, in partial remission (HCC)  F33.41     5. Hyperlipidemia, unspecified hyperlipidemia type  E78.5 CBC with Differential/Platelet    Comprehensive metabolic panel    LDL cholesterol, direct    6. Hyperglycemia  R73.9 Hemoglobin A1c    7. Atrial fibrillation, unspecified type (HCC)  I48.91     8. Insulin resistance  E88.81 Hemoglobin A1c    9. Chronic obstructive pulmonary disease, unspecified COPD type (HCC)  J44.9       I,Harris Phan,acting as a scribe for Tana ConchStephen Gilbert Narain, MD.,have documented all relevant documentation on the behalf of Tana ConchStephen Tesneem Dufrane, MD,as directed by  Tana ConchStephen Taralyn Ferraiolo, MD while in the presence of Tana ConchStephen Tyra Gural, MD.  I, Tana ConchStephen Nalany Steedley, MD, have reviewed all documentation for this visit. The documentation on 07/17/21 for the exam, diagnosis, procedures, and orders are all accurate and complete.  Return precautions advised.  Tana ConchStephen Yuvan Medinger, MD

## 2021-07-12 NOTE — Patient Instructions (Signed)
Visit Information  PATIENT GOALS:  Goals Addressed             This Visit's Progress    (RNCM) Keep Low Back Pain Under Control   On track    Timeframe:  Short-Term Goal Priority:  Medium Start Date:    06/12/21                         Expected End Date: 12/31/21                      Follow Up Date 08/09/21    Call for medicine refill 2 or 3 days before it runs out Develop a personal pain management plan Plan exercise or activity when pain is best controlled Prioritize tasks for the day; Fall precautions and preventions Increase activity as tolerated, using walker with all ambulation Track times pain is worst and when it is best Track what makes the pain worse and what makes it better Work slower and less intense when having pain  Seek medical attention right away if you experience unilateral weakness, inability to walk   Why is this important?   Day-to-day life can be hard when you have back pain.  Pain medicine is just one piece of the treatment puzzle. There are many things you can do to manage pain and keep your back strong.   Lifestyle changes, like stopping smoking and eating foods with Vitamin D and calcium, keep your bones and muscles healthy. Your back is better when it is supported by strong muscles.  You can try these action steps to help you manage your pain.     Notes:      (RNCM) Track and Manage Fluids and Swelling-Heart Failure   On track    Timeframe:  Long-Range Goal Priority:  Medium Start Date:   06/12/21                          Expected End Date:  12/31/21                     Follow Up Date 08/09/21    Call office if I gain more than 2 pounds in one day or 5 pounds in one week Keep legs up while sitting Track weight in diary Use salt in moderation Watch for swelling in feet, ankles and legs every day Weigh myself daily  Check blood pressure daily logging for provider review   Why is this important?   It is important to check your weight daily and  watch how much salt and liquids you have.  It will help you to manage your heart failure.    Notes:         The patient verbalized understanding of instructions, educational materials, and care plan provided today and declined offer to receive copy of patient instructions, educational materials, and care plan.   The care management team will reach out to the patient again over the next 30 business days.   Rhae Lerner RN, MSN RN Care Management Coordinator  [facility] [place] [phone] [email or web address]

## 2021-07-12 NOTE — Chronic Care Management (AMB) (Signed)
Chronic Care Management   CCM RN Visit Note  07/12/2021 Name: Cheryl Atkinson MRN: [id number] DOB: 09-10-1931  Subjective: APRYL Atkinson is a 87 y.o. year old female who is a primary care patient of Durene Cal, [name], MD. The care management team was consulted for assistance with disease management and care coordination needs.    Engaged with patient by telephone for follow up visit in response to provider referral for case management and/or care coordination services.   Consent to Services:  The patient was given information about Chronic Care Management services, agreed to services, and gave verbal consent prior to initiation of services.  Please see initial visit note for detailed documentation.   Patient agreed to services and verbal consent obtained.   Assessment: Review of patient past medical history, allergies, medications, health status, including review of consultants reports, laboratory and other test data, was performed as part of comprehensive evaluation and provision of chronic care management services.   SDOH (Social Determinants of Health) assessments and interventions performed:    CCM Care Plan  Allergies  Allergen Reactions   Codeine     Hallucinations     Codeine Other (See Comments)    Syncope     Outpatient Encounter Medications as of 07/12/2021  Medication Sig Note   amLODipine (NORVASC) 5 MG tablet Take 1 tablet (5 mg total) by mouth daily.    furosemide (LASIX) 40 MG tablet Take 1 tablet (40 mg total) by mouth 2 (two) times daily.    irbesartan (AVAPRO) 300 MG tablet Take 1 tablet (300 mg total) by mouth daily.    Albuterol Sulfate (PROAIR RESPICLICK) 108 (90 Base) MCG/ACT AEPB Inhale 2 puffs into the lungs every 6 (six) hours as needed (shortness of breath from COPD).    amoxicillin-clavulanate (AUGMENTIN) 875-125 MG tablet Take 1 tablet by mouth 2 (two) times daily.    apixaban (ELIQUIS) 5 MG TABS tablet Take 1 tablet (5 mg total) by mouth 2 (two)  times daily.    CALCIUM PO Take by mouth.    Cholecalciferol (VITAMIN D3 PO) Take by mouth.    ferrous sulfate 325 (65 FE) MG tablet Take 325 mg by mouth 2 (two) times a week. Patient takes one tablet on Monday and Friday    HYDROcodone-acetaminophen (NORCO/VICODIN) 5-325 MG tablet Take 1 tablet by mouth every 6 (six) hours as needed for severe pain.    LINZESS 145 MCG CAPS capsule Take 145 mcg by mouth every morning. 06/12/2021: Takes as needed   Multiple Vitamins-Minerals (PRESERVISION AREDS 2 PO) Take 1 tablet by mouth 2 (two) times daily.    polyethylene glycol (MIRALAX / GLYCOLAX) 17 g packet Take 17 g by mouth 2 (two) times daily. Reported on 03/14/2016 (Patient taking differently: Take 17 g by mouth daily as needed for mild constipation.)    potassium chloride SA (KLOR-CON) 20 MEQ tablet Take 1 tablet (20 mEq total) by mouth 2 (two) times daily as needed. Needs to be taken  With each dose of lasix    PROAIR RESPICLICK 108 (90 Base) MCG/ACT AEPB Inhale 2 puffs into the lungs every 6 (six) hours as needed for shortness of breath.    rosuvastatin (CRESTOR) 40 MG tablet Take 1 tablet (40 mg total) by mouth daily. Take 1 tablet by mouth daily.    Rotigotine (NEUPRO) 3 MG/24HR PT24 PLACE 1 PATCH (3 MG) ONTO THE SKIN AT BEDTIME    traZODone (DESYREL) 50 MG tablet Take 1.5 tablets (75 mg total) by mouth  at bedtime as needed for sleep.    vitamin B-12 (CYANOCOBALAMIN) 1000 MCG tablet Take 1,000 mcg by mouth daily.    No facility-administered encounter medications on file as of 07/12/2021.    Patient Active Problem List   Diagnosis Date Noted   B12 deficiency 02/27/2021   Pleural effusion due to CHF (congestive heart failure) (HCC) 05/26/2020   S/P laparoscopic cholecystectomy 04/04/2020   Heart failure with preserved ejection fraction (HCC) 03/01/2020   Insomnia 03/01/2020   Bacteremia 01/28/2020   Atrial fibrillation (HCC) 01/25/2020   Abdominal aortic ectasia (HCC) 12/29/2019   Major  depression 03/25/2018   Cat bite of right hand 12/21/2016   BPPV (benign paroxysmal positional vertigo) 07/12/2016   Memory loss 04/11/2016   Mallet toe of right foot 10/20/2015   Renal artery stenosis (HCC) 09/27/2015   Hyperlipidemia 06/30/2015   Claudication (HCC) 05/04/2015   Atherosclerotic PVD with intermittent claudication (HCC) 04/26/2015   Tinnitus 12/31/2014   Former smoker 09/29/2014   DNR (do not resuscitate) 09/29/2014   Hyperglycemia 07/19/2014   Multinodular goiter 08/30/2013   Spinal stenosis of lumbar region at multiple levels 09/29/2012   HIP PAIN, BILATERAL 07/16/2010   COPD (chronic obstructive pulmonary disease) (HCC) 09/20/2009   CONSTIPATION, CHRONIC 09/20/2009   Iron deficiency anemia 11/09/2007   History of UTI 11/09/2007   RESTLESS LEG SYNDROME 09/25/2007   Essential hypertension 09/25/2007   GERD 09/25/2007   LOW BACK PAIN 09/25/2007   Osteoporosis 09/25/2007    Conditions to be addressed/monitored:CHF, HTN, and Pain  Care Plan : Nonspecific Low Back Pain (Adult)  Updates made by Maple Mirza, RN since 07/12/2021 12:00 AM     Problem: Chronic Low Back Pain   Priority: Medium     Long-Range Goal: Chronic Low Back Pain Managed   Start Date: 06/12/2021  Expected End Date: 12/31/2021  This Visit's Progress: On track  Priority: Medium  Note:   Current Barriers:  Knowledge Deficits related to self-health management of acute or chronic pain; Patient with fall in February with resulting compression fracture.  Reports continued pain being managed by hydrocodone every other day.  Continues to deny falls since February.  Reporting cellulitis from dog scratch on her leg is healing.  States she took last dose of antibiotics today.  Denies ay signs and symptoms of infection at scratch area  but does still endorse some tenderness. Chronic Disease Management support and education needs related to chronic pain Unable to perform ADLs independently and Unable  to perform IADLs independently; patient has care taker 6 days a week, 12 hours a day whom assist with ADLs and IADLs Clinical Goal(s):  patient will verbalize understanding of plan for pain management. , patient will demonstrate use of different relaxation  skills and/or diversional activities to assist with pain reduction (distraction, imagery, relaxation, massage, acupressure, TENS, heat, and cold application., patient will use pharmacological and nonpharmacological pain relief strategies as prescribed. , and patient will verbalize acceptable level of pain relief and ability to engage in desired activities Interventions:  Collaboration with Shelva Majestic, MD regarding development and update of comprehensive plan of care as evidenced by provider attestation and co-signature Pain assessment performed Medications reviewed and compliance encouraged Discussed plans with patient for ongoing care management follow up and provided patient with direct contact information for care management team Evaluation of current treatment plan related to back pain and patient's adherence to plan as established by provider. Discussed and instructed patient to call provider office for new concerns  or questions Encouraged careful application of heat or ice encouraged Discussed complementary therapy use and encouraged Misuse of pain medication assessed Mutually acceptable comfort goal set Pain management plan developed and pain treatment goals reviewed Participation in rehabilitation therapy encouraged Patient response to treatment assessed Premedication prior to activity encouraged Fall precautions and preventions reviewed and discussed; encouraged to use rolling walker with all ambulation No significant changes in interventions or goals Encouraged use of multimodal interventions, such as physical therapy, cognitive behavior therapy, massage, distraction, yoga, relaxation, chiropractic manipulation, herbal  supplement, acupuncture, application of heat or cold. Patient Goals/Self Care Activities:  Call for medicine refill 2 or 3 days before it runs out Develop a personal pain management plan Plan exercise or activity when pain is best controlled Prioritize tasks for the day; Fall precautions and preventions Increase activity as tolerated, using walker with all ambulation Track times pain is worst and when it is best Track what makes the pain worse and what makes it better Work slower and less intense when having pain  Seek medical attention right away if you experience unilateral weakness, inability to walk Follow Up Plan: The care management team will reach out to the patient again over the next 30 business days.     Care Plan : Heart Failure (Adult)  Updates made by Maple Mirzaarpley, Abigail Marsiglia D, RN since 07/12/2021 12:00 AM     Problem: Symptom Exacerbation (Heart Failure)   Priority: Medium     Long-Range Goal: Symptom Exacerbation Prevented or Minimized   Start Date: 06/12/2021  Expected End Date: 12/31/2021  This Visit's Progress: Not on track  Priority: Medium  Note:   Current Barriers:  Knowledge deficits related to basic heart failure pathophysiology and self care management; states she weigh daily, weight this morning was 138.  Denies any lower extremity edema; does endorse some shortness of breath with activity.  Reports not needing to use rescue inhaler in the last month.  Reports compliance with fluid medication.  Blood pressure 166/83 with recent elevations 182/85; does report some light headiness and dizziness upon waking in the mornings that resolves as the day goes by. Nurse Case Manager Clinical Goal(s):  patient will weigh self daily and record patient will verbalize understanding of Heart Failure Action Plan and when to call doctor patient will take all Heart Failure mediations as prescribed Interventions:  Collaboration with Shelva MajesticHunter, Stephen O, MD regarding development and update  of comprehensive plan of care as evidenced by provider attestation and co-signature Inter-disciplinary care team collaboration (see longitudinal plan of care) Basic overview and discussion of pathophysiology of Heart Failure Provided written and verbal education on low sodium diet Reviewed Heart Failure Action Plan and encouraged patient to verbalizes understanding of and follows CHF Action Plan Assessed for scales in home Discussed importance of daily weight and when to call provider based on weight Encouraged to continue to work with Palliative Care at home Reviewed role of diuretics in prevention of fluid overload Barriers to lifestyle changes reviewed and addressed and barriers to treatment reviewed and addressed Cognitive screening completed and reviewed Depression screen reviewed and healthy lifestyle promoted Self-awareness of signs/symptoms of worsening disease encouraged   Encouraged to weigh daily and record (notifying MD of 3 lb weight gain over night or 5 lb in a week)   Reviewed and encouraged patient to adhere to low sodium diet; reviewed foods high in sodium Encouraged to monitor blood pressures daily, rechecking blood pressures if elevated prior to medications, and keep log for provider review  at upcoming appointment Patient Goals/Self-Care Activities:   Call office if I gain more than 2 pounds in one day or 5 pounds in one week Keep legs up while sitting Track weight in diary Use salt in moderation Watch for swelling in feet, ankles and legs every day Weigh myself daily  Check blood pressure daily logging for provider review Follow Up Plan: The care management team will reach out to the patient again over the next 30 business days.       Plan:The care management team will reach out to the patient again over the next 30 business days.  Rhae Lerner RN, MSN RN Care Management Coordinator  [facility] [phone] [email or web address]

## 2021-07-16 ENCOUNTER — Ambulatory Visit (INDEPENDENT_AMBULATORY_CARE_PROVIDER_SITE_OTHER): Payer: Medicare Other | Admitting: Neurology

## 2021-07-16 ENCOUNTER — Other Ambulatory Visit: Payer: Self-pay

## 2021-07-16 ENCOUNTER — Encounter: Payer: Self-pay | Admitting: Neurology

## 2021-07-16 VITALS — BP 129/76 | HR 66 | Ht 66.0 in | Wt 138.0 lb

## 2021-07-16 DIAGNOSIS — I701 Atherosclerosis of renal artery: Secondary | ICD-10-CM | POA: Diagnosis not present

## 2021-07-16 DIAGNOSIS — G2581 Restless legs syndrome: Secondary | ICD-10-CM | POA: Diagnosis not present

## 2021-07-16 MED ORDER — CARBIDOPA-LEVODOPA 25-100 MG PO TABS: ORAL_TABLET | ORAL | 1 refills | Status: DC

## 2021-07-16 NOTE — Patient Instructions (Signed)
Reduce neupro by 1 patch every 2 days, then stay on 1 patch daily  You may try sinemet for breakthrough RLS symptoms  Return to clinic in 3 months

## 2021-07-16 NOTE — Progress Notes (Signed)
[facility] Clinic Note - Initial Visit   Date: 07/16/21  Cheryl Atkinson MRN: [id number] DOB: 1931-02-03   Dear Dr. Durene Cal:  Thank you for your kind referral of Cheryl Atkinson for consultation of RLS. Although her history is well known to you, please allow [place] to reiterate it for the purpose of our medical record. The patient was accompanied to the clinic by caregiver/cousin Cheryl Atkinson) who also provides collateral information.     History of Present Illness: Cheryl Atkinson is a 87 y.o. right-handed female with CHF, COPD, GERD, hypertension, and hyperlipidemia presenting for evaluation of worsening RLS.   She has RLS for the past 15 years, descirbed as crawling sensation and discomfort.  It is worse at rest and improved with activity.  She takes Neupro 3mg  patch for the past several years, but does not have any ongoing benefit.  She was also on ropinirole in the past.  Upon further questioning, she tells me that over the past few months, she has been putting 2-4 patches of Neupro 3mg  on, where has she is prescribed to apply one patch only.  She does not have any benefit. She is predominately wheelchair bound since a fall resulting in lumbar fracture earlier this year.  She walks with assistance and a walker at home.   She lives at home with her son.  Her cousin, , is her caregiver who is home all day.  She needs assistance with ADLs.    Out-side paper records, electronic medical record, and images have been reviewed where available and summarized as:  Lab Results  Component Value Date   HGBA1C 6.2 12/04/2020   Lab Results  Component Value Date   VITAMINB12 524 02/27/2021   Lab Results  Component Value Date   TSH 1.39 02/11/2020   Lab Results  Component Value Date   ESRSEDRATE 59 (H) 12/21/2016   Lab Results  Component Value Date   FERRITIN 42 05/29/2020     Past Medical History:  Diagnosis Date   Acute cholecystitis 12/29/2019    Anemia    Arthritis    "shoulders" (05/04/2015)   Cellulitis of right lower extremity 11/27/2013   CHF (congestive heart failure) (HCC)    Chronic lower back pain    Constipation    COPD (chronic obstructive pulmonary disease) (HCC)    GERD (gastroesophageal reflux disease)    History of hiatal hernia    HLD (hyperlipidemia)    Hypertension    Insomnia    Osteoporosis    Peripheral arterial disease (HCC)    Restless leg syndrome    Thyroid disease     Past Surgical History:  Procedure Laterality Date   ABDOMINAL AORTAGRAM  05/04/2015   Procedure: Abdominal Aortagram;  Surgeon: 11/29/2013, MD;  Location: [facility] INVASIVE CV LAB;  Service: Cardiovascular;;   APPENDECTOMY     CATARACT EXTRACTION W/ INTRAOCULAR LENS  IMPLANT, BILATERAL Bilateral    CHOLECYSTECTOMY N/A 04/04/2020   Procedure: LAPAROSCOPIC CHOLECYSTECTOMY;  Surgeon: Runell Gess, MD;  Location: [facility] OR;  Service: General;  Laterality: N/A;   I & D EXTREMITY Right 12/22/2016   Procedure: IRRIGATION AND DEBRIDEMENT EXTREMITY;  Surgeon: [facility], MD;  Location: [facility] OR;  Service: Orthopedics;  Laterality: Right;   IR EXCHANGE BILIARY DRAIN  03/13/2020   IR PATIENT EVAL TECH 0-60 MINS  12/30/2019   IR PERC CHOLECYSTOSTOMY  01/27/2020   IR RADIOLOGIST EVAL & MGMT  05/16/2020   PERIPHERAL VASCULAR CATHETERIZATION N/A 05/04/2015  Procedure: Lower Extremity Angiography;  Surgeon: Runell GessJonathan J Berry, MD;  [facility];  Service: Cardiovascular;  Laterality: N/A;   PERIPHERAL VASCULAR CATHETERIZATION  05/04/2015   Procedure: Peripheral Vascular Intervention;  Surgeon: Runell GessJonathan J Berry, MD;  [facility];  Service: Cardiovascular;;  RCIA - 7x22 ICAST   PERIPHERAL VASCULAR CATHETERIZATION Right 09/04/2015   Procedure: Peripheral Vascular Atherectomy;  Surgeon: Runell GessJonathan J Berry, MD;  [facility];  Service: Cardiovascular;  Laterality: Right;  SFA   sfa Right 09/04/2015   de balloon    THYROID SURGERY Right ?2013   "had goiter taken off my neck"   TONSILLECTOMY     VAGINAL HYSTERECTOMY       Medications:  Outpatient Encounter Medications as of 07/16/2021  Medication Sig Note   Albuterol Sulfate (PROAIR RESPICLICK) 108 (90 Base) MCG/ACT AEPB Inhale 2 puffs into the lungs every 6 (six) hours as needed (shortness of breath from COPD).    amLODipine (NORVASC) 5 MG tablet Take 1 tablet (5 mg total) by mouth daily.    apixaban (ELIQUIS) 5 MG TABS tablet Take 1 tablet (5 mg total) by mouth 2 (two) times daily.    CALCIUM PO Take by mouth.    Cholecalciferol (VITAMIN D3 PO) Take by mouth.    ferrous sulfate 325 (65 FE) MG tablet Take 325 mg by mouth 2 (two) times a week. Patient takes one tablet on Monday and Friday    furosemide (LASIX) 40 MG tablet Take 1 tablet (40 mg total) by mouth 2 (two) times daily.    HYDROcodone-acetaminophen (NORCO/VICODIN) 5-325 MG tablet Take 1 tablet by mouth every 6 (six) hours as needed for severe pain.    irbesartan (AVAPRO) 300 MG tablet Take 1 tablet (300 mg total) by mouth daily.    LINZESS 145 MCG CAPS capsule Take 145 mcg by mouth every morning. 06/12/2021: Takes as needed   Multiple Vitamins-Minerals (PRESERVISION AREDS 2 PO) Take 1 tablet by mouth 2 (two) times daily.    polyethylene glycol (MIRALAX / GLYCOLAX) 17 g packet Take 17 g by mouth 2 (two) times daily. Reported on 03/14/2016 (Patient taking differently: Take 17 g by mouth daily as needed for mild constipation.)    potassium chloride SA (KLOR-CON) 20 MEQ tablet Take 1 tablet (20 mEq total) by mouth 2 (two) times daily as needed. Needs to be taken  With each dose of lasix    PROAIR RESPICLICK 108 (90 Base) MCG/ACT AEPB Inhale 2 puffs into the lungs every 6 (six) hours as needed for shortness of breath.    rosuvastatin (CRESTOR) 40 MG tablet Take 1 tablet (40 mg total) by mouth daily. Take 1 tablet by mouth daily.    Rotigotine (NEUPRO) 3 MG/24HR PT24 PLACE 1 PATCH (3 MG) ONTO THE SKIN  AT BEDTIME    traZODone (DESYREL) 50 MG tablet Take 1.5 tablets (75 mg total) by mouth at bedtime as needed for sleep.    vitamin B-12 (CYANOCOBALAMIN) 1000 MCG tablet Take 1,000 mcg by mouth daily.    [DISCONTINUED] amoxicillin-clavulanate (AUGMENTIN) 875-125 MG tablet Take 1 tablet by mouth 2 (two) times daily. (Patient not taking: Reported on 07/16/2021)    No facility-administered encounter medications on file as of 07/16/2021.    Allergies:  Allergies  Allergen Reactions   Codeine     Hallucinations     Codeine Other (See Comments)    Syncope     Family History: Family History  Problem Relation Age of  Onset   Heart disease Mother    Heart disease Father    Heart attack Father    Cancer Sister        Breast Cancer   Atrial fibrillation Sister    Stroke Maternal Grandmother    Cancer Paternal Grandmother    Coronary artery disease Other     Social History: Social History   Tobacco Use   Smoking status: Former    Packs/day: 0.50    Years: 60.00    Pack years: 30.00    Types: Cigarettes    Quit date: 04/02/2015    Years since quitting: 6.2   Smokeless tobacco: Never  Vaping Use   Vaping Use: Never used  Substance Use Topics   Alcohol use: No   Drug use: No   Social History   Social History Narrative   ** Merged History Encounter ** Widowed 2013. 2 sons. 1 grandchild.    Son can help on weekends. Sister and sister in law could help as well. Thinks she may stop driving in next few years.       Retired from [employer].       Hobbies: time at home and with family      HCPOA: sister-in-law and brother. Deirdre Evener.       DNR/DNI      Regular exercise: none   Caffeine use: cup of coffee    Lives in a one story home    Right Handed     Vital Signs:  BP 129/76   Pulse 66   Ht 5\' 6"  (1.676 m)   Wt 138 lb (62.6 kg)   LMP  (LMP Unknown)   SpO2 97%   BMI 22.27 kg/m   Neurological Exam: MENTAL STATUS including orientation to time, place, person,  recent and remote memory, attention span and concentration, language, and fund of knowledge is normal.  Speech is not dysarthric.  CRANIAL NERVES: II:  No visual field defects.  .   III-IV-VI: Pupils equal round and reactive to light.  Normal conjugate, extra-ocular eye movements in all directions of gaze.  No nystagmus.  No ptosis.   V:  Normal facial sensation.    VII:  Normal facial symmetry and movements.   VIII:  Normal hearing and vestibular function.   IX-X:  Normal palatal movement.   XI:  Normal shoulder shrug and head rotation.   XII:  Normal tongue strength and range of motion, no deviation or fasciculation.  MOTOR:  Motor strength is 5-/5 in the arms and 4/5 in the legs. No atrophy, fasciculations or abnormal movements.  No pronator drift.    MSRs:  Right        Left                  brachioradialis 2+  2+  biceps 2+  2+  triceps 2+  2+  patellar 2+  2+  ankle jerk 0  0  Hoffman no  no  plantar response down  down   SENSORY:  Normal and symmetric perception of light touch, pinprick, vibration, and proprioception.    COORDINATION/GAIT: Normal finger-to- nose-finger.  Intact rapid alternating movements bilaterally.  Gait not tested, patient in wheelchair   IMPRESSION: Restless leg syndrome, worsening, likely due to augmentation given that she hs been self titrating the dose of Neupro to 12mg /d, where it is prescribed as 3mg /d, which is the maximum dose for RLS.  I informed patient that she needs to taper this back down  to 3mg /d.  For severe discomfort, I will start her on sinemet 25/100 daily as needed.  She will call with an update in 2-4 weeks.  If she continues to have refractory RLS, plan to switch to gabapentin.   Thank you for allowing me to participate in patient's care.  If I can answer any additional questions, I would be pleased to do so.    Sincerely,    Cheryl Ahart K. , DO

## 2021-07-17 ENCOUNTER — Encounter: Payer: Self-pay | Admitting: Family Medicine

## 2021-07-17 ENCOUNTER — Telehealth: Payer: Self-pay

## 2021-07-17 ENCOUNTER — Ambulatory Visit (INDEPENDENT_AMBULATORY_CARE_PROVIDER_SITE_OTHER): Payer: Medicare Other | Admitting: Family Medicine

## 2021-07-17 VITALS — BP 132/72 | HR 65 | Temp 97.4°F | Wt 136.2 lb

## 2021-07-17 DIAGNOSIS — G2581 Restless legs syndrome: Secondary | ICD-10-CM | POA: Diagnosis not present

## 2021-07-17 DIAGNOSIS — I509 Heart failure, unspecified: Secondary | ICD-10-CM | POA: Diagnosis not present

## 2021-07-17 DIAGNOSIS — F3341 Major depressive disorder, recurrent, in partial remission: Secondary | ICD-10-CM

## 2021-07-17 DIAGNOSIS — J449 Chronic obstructive pulmonary disease, unspecified: Secondary | ICD-10-CM | POA: Diagnosis not present

## 2021-07-17 DIAGNOSIS — E8881 Metabolic syndrome: Secondary | ICD-10-CM | POA: Diagnosis not present

## 2021-07-17 DIAGNOSIS — I701 Atherosclerosis of renal artery: Secondary | ICD-10-CM

## 2021-07-17 DIAGNOSIS — R739 Hyperglycemia, unspecified: Secondary | ICD-10-CM | POA: Diagnosis not present

## 2021-07-17 DIAGNOSIS — I10 Essential (primary) hypertension: Secondary | ICD-10-CM

## 2021-07-17 DIAGNOSIS — I4891 Unspecified atrial fibrillation: Secondary | ICD-10-CM | POA: Diagnosis not present

## 2021-07-17 DIAGNOSIS — E785 Hyperlipidemia, unspecified: Secondary | ICD-10-CM | POA: Diagnosis not present

## 2021-07-17 LAB — COMPREHENSIVE METABOLIC PANEL
ALT: 11 U/L (ref 0–35)
AST: 15 U/L (ref 0–37)
Albumin: 3.7 g/dL (ref 3.5–5.2)
Alkaline Phosphatase: 98 U/L (ref 39–117)
BUN: 18 mg/dL (ref 6–23)
CO2: 28 mEq/L (ref 19–32)
Calcium: 9 mg/dL (ref 8.4–10.5)
Chloride: 103 mEq/L (ref 96–112)
Creatinine, Ser: 0.74 mg/dL (ref 0.40–1.20)
GFR: 72.51 mL/min (ref >60.00)
Glucose, Bld: 97 mg/dL (ref 70–99)
Potassium: 3.2 mEq/L — ABNORMAL LOW (ref 3.5–5.1)
Sodium: 140 mEq/L (ref 135–145)
Total Bilirubin: 0.7 mg/dL (ref 0.2–1.2)
Total Protein: 7.1 g/dL (ref 6.0–8.3)

## 2021-07-17 LAB — CBC WITH DIFFERENTIAL/PLATELET
Basophils Absolute: 0 10*3/uL (ref 0.0–0.1)
Basophils Relative: 0.7 % (ref 0.0–3.0)
Eosinophils Absolute: 0.1 10*3/uL (ref 0.0–0.7)
Eosinophils Relative: 1.8 % (ref 0.0–5.0)
HCT: 33 % — ABNORMAL LOW (ref 36.0–46.0)
Hemoglobin: 10.8 g/dL — ABNORMAL LOW (ref 12.0–15.0)
Lymphocytes Relative: 25.3 % (ref 12.0–46.0)
Lymphs Abs: 1.8 10*3/uL (ref 0.7–4.0)
MCHC: 32.9 g/dL (ref 30.0–36.0)
MCV: 87.5 fl (ref 78.0–100.0)
Monocytes Absolute: 0.6 10*3/uL (ref 0.1–1.0)
Monocytes Relative: 8 % (ref 3.0–12.0)
Neutro Abs: 4.6 10*3/uL (ref 1.4–7.7)
Neutrophils Relative %: 64.2 % (ref 43.0–77.0)
Platelets: 212 10*3/uL (ref 150.0–400.0)
RBC: 3.77 Mil/uL — ABNORMAL LOW (ref 3.87–5.11)
RDW: 14.9 % (ref 11.5–15.5)
WBC: 7.2 10*3/uL (ref 4.0–10.5)

## 2021-07-17 LAB — LDL CHOLESTEROL, DIRECT: Direct LDL: 53 mg/dL

## 2021-07-17 LAB — HEMOGLOBIN A1C: Hgb A1c MFr Bld: 6.4 % (ref 4.6–6.5)

## 2021-07-17 IMAGING — CT CT ABD-PELV W/ CM
2 of 5 series · 17 of 46 positions shown, 19 images · IV contrast (omnipaque)
Comparison: 12/29/2019

CLINICAL DATA: Fever and abdominal pain, recent percutaneous
cholecystostomy

EXAM:
CT ABDOMEN AND PELVIS WITH CONTRAST
TECHNIQUE: Multidetector CT imaging of the abdomen and pelvis was performed
using the standard protocol following bolus administration of
intravenous contrast.
CONTRAST:  100mL OMNIPAQUE IOHEXOL 300 MG/ML  SOLN

[Series 3: abdomen 5.0 · axial · 0.93mm/px · z∈[+826,+1216]mm · 14 of 92 slices shown, 16 images]
[im 7/92  soft-tissue]
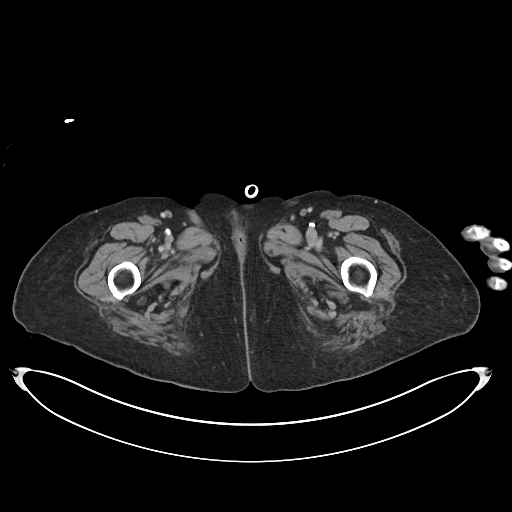
[im 7/92  bone]
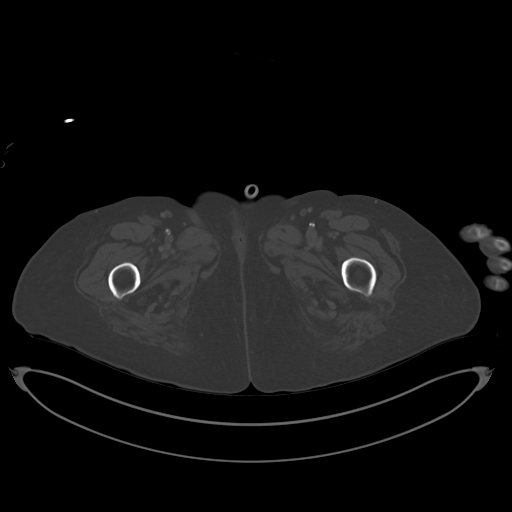
[im 13/92  soft-tissue]
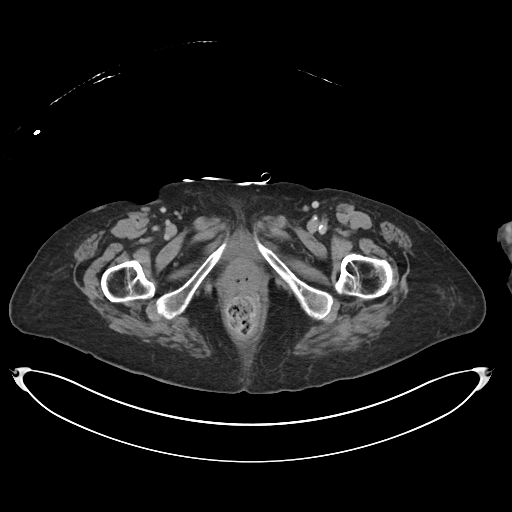
[im 19/92  soft-tissue]
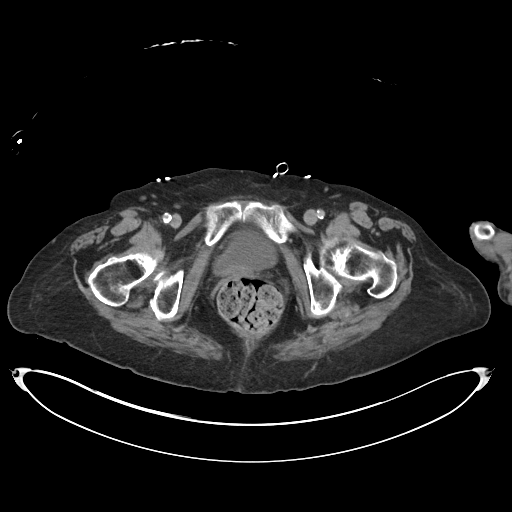
[im 25/92  soft-tissue]
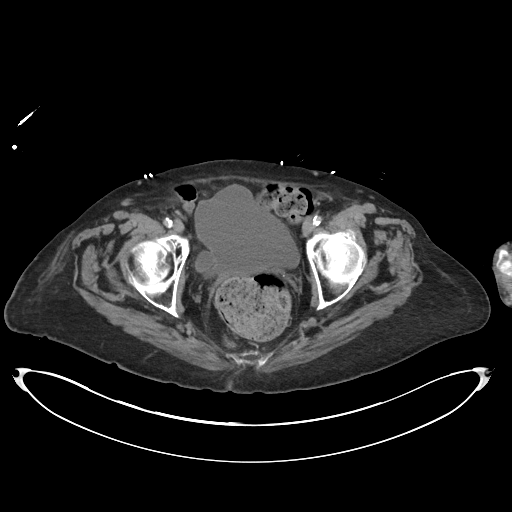
[im 31/92  soft-tissue]
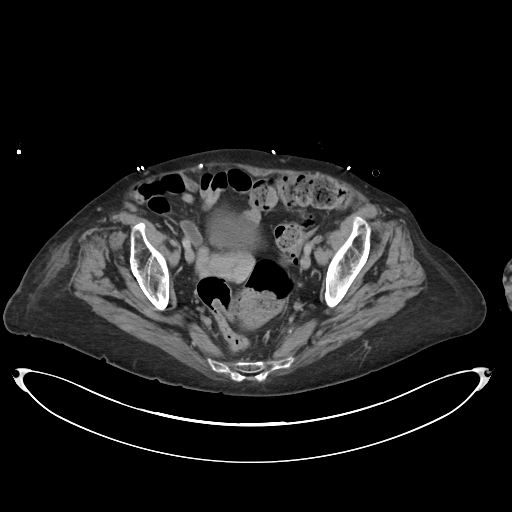
[im 37/92  soft-tissue]
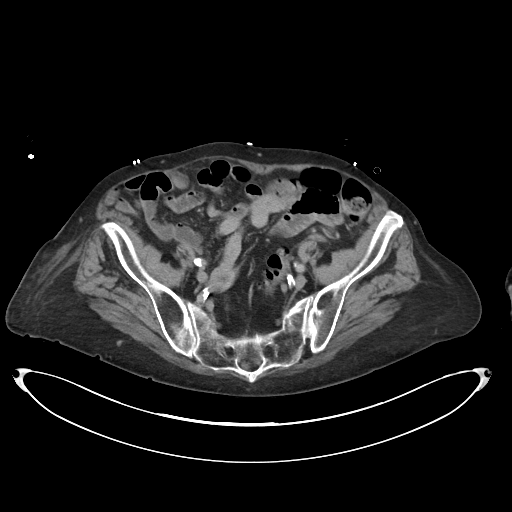
[im 43/92  soft-tissue]
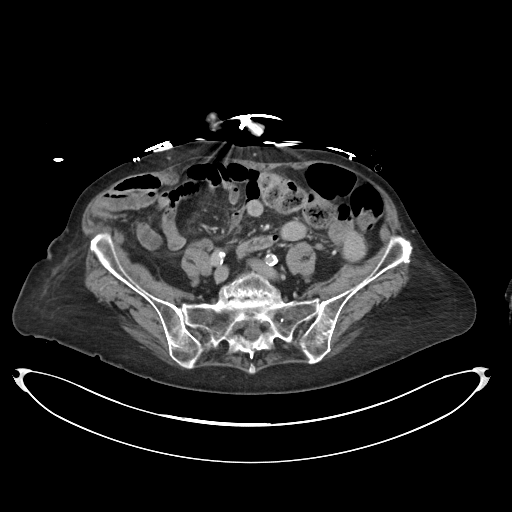
[im 49/92  soft-tissue]
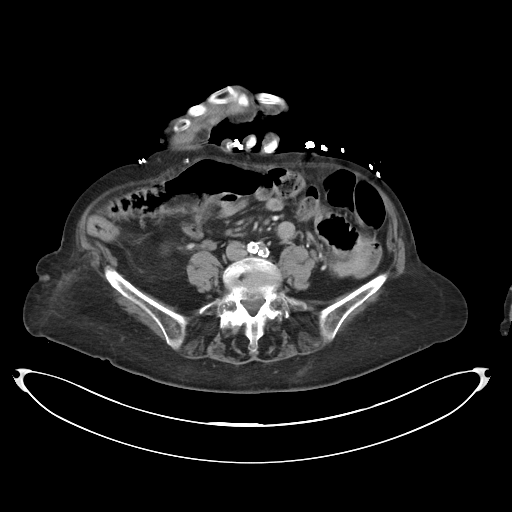
[im 55/92  soft-tissue]
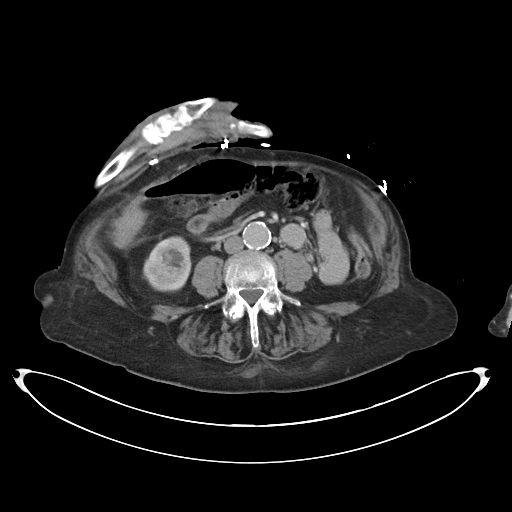
[im 55/92  bone]
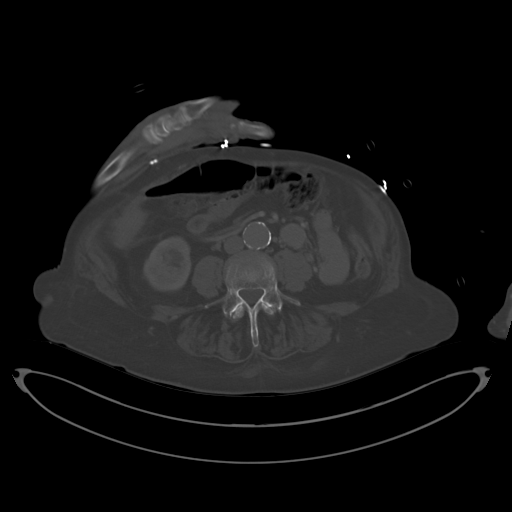
[im 61/92  soft-tissue]
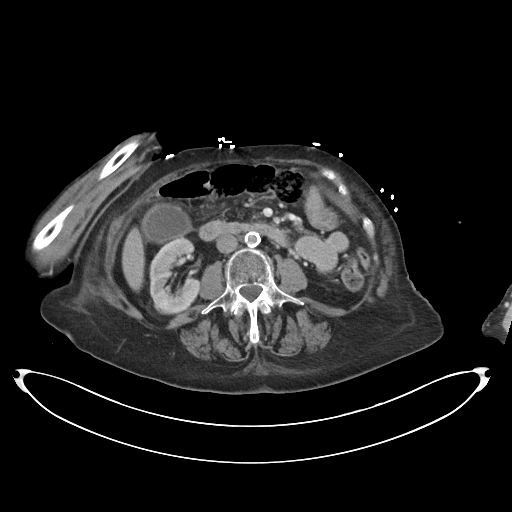
[im 67/92  soft-tissue]
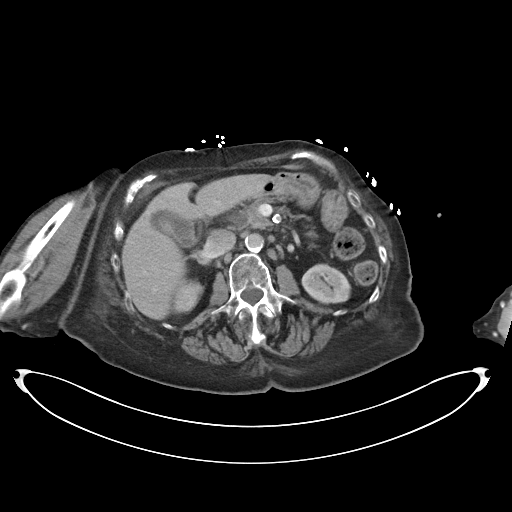
[im 73/92  soft-tissue]
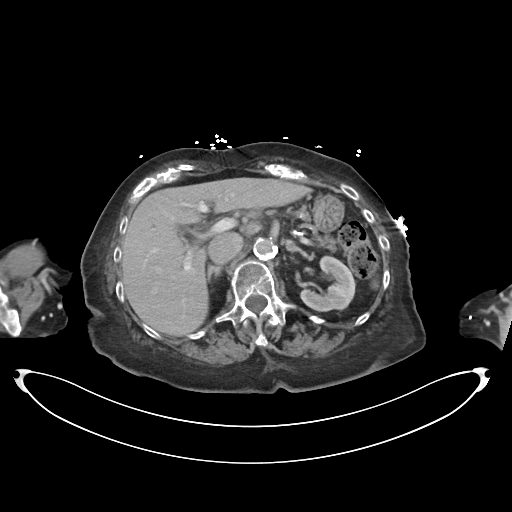
[im 79/92  soft-tissue]
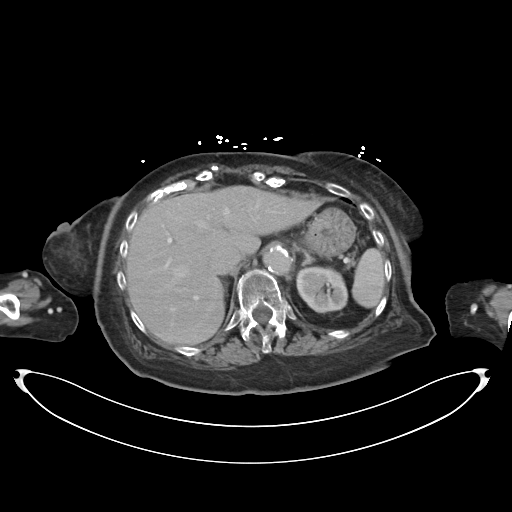
[im 85/92  soft-tissue]
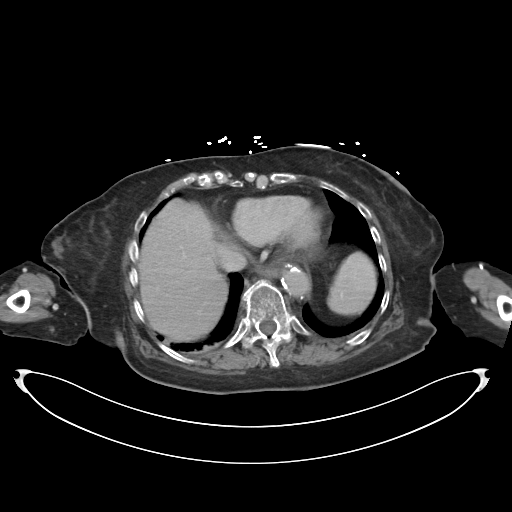

[Series 6: abdomen 3.0 mpr cor · coronal · 0.86mm/px · 3 of 81 slices shown]
[im 27/81  soft-tissue]
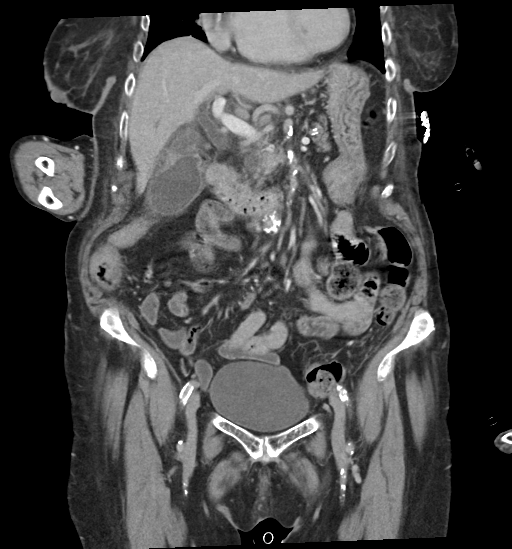
[im 36/81  soft-tissue]
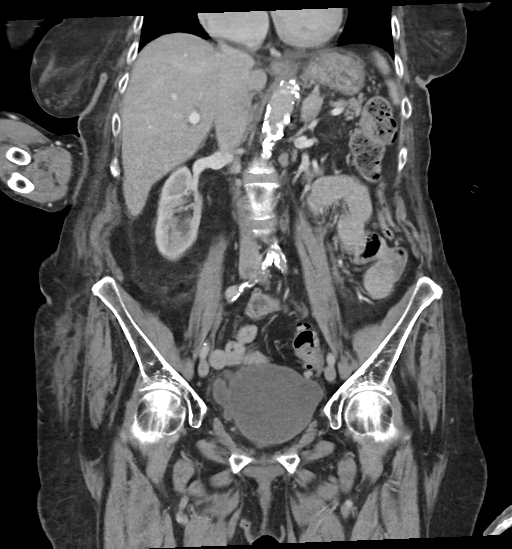
[im 45/81  soft-tissue]
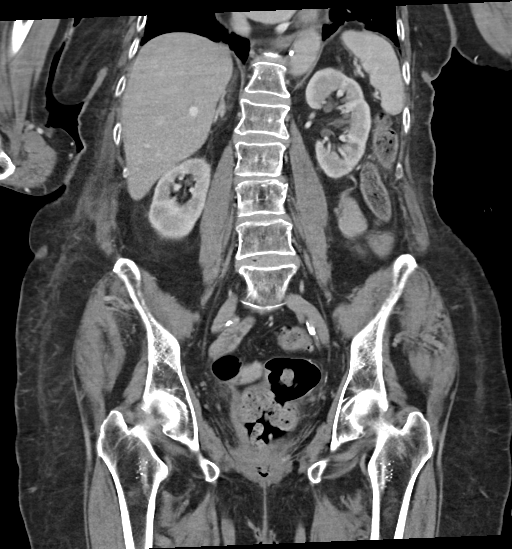

[17 of 46 positions shown; findings below may reference images not displayed]

FINDINGS: Lower chest: Hypoventilatory changes are seen at the lung bases.

Hepatobiliary: There is mild residual gallbladder wall thickening,
though markedly improved since prior CT. Gallbladder is only
minimally distended. The liver is unremarkable.

Pancreas: Unremarkable. No pancreatic ductal dilatation or
surrounding inflammatory changes.

Spleen: Normal in size without focal abnormality.

Adrenals/Urinary Tract: The adrenals, kidneys, and bladder are
stable. No acute findings.

Stomach/Bowel: No bowel obstruction or ileus. Moderate retained
stool within the distal colon.

Vascular/Lymphatic: Stable mild dilatation of the infrarenal
abdominal aorta measuring 2.5 cm. Extensive aortic atherosclerosis.
No pathologic adenopathy.

Reproductive: Status post hysterectomy. No adnexal masses.

Other: No abdominal wall hernia or abnormality. No abdominopelvic
ascites.

Musculoskeletal: No acute or destructive bony lesions. Reconstructed
images demonstrate no additional findings.
IMPRESSION: 1. Residual gallbladder wall thickening, though markedly improved
since prior study. Persistent underlying cholecystitis cannot be
excluded.
2. Moderate fecal retention.
3. Otherwise stable exam.

## 2021-07-17 MED ORDER — TRAMADOL HCL 50 MG PO TABS: 50.0000 mg | ORAL_TABLET | Freq: Four times a day (QID) | ORAL | 0 refills | Status: DC | PRN

## 2021-07-17 NOTE — Telephone Encounter (Signed)
Called and spoke with pt and below message given. 

## 2021-07-17 NOTE — Telephone Encounter (Signed)
#   L2 compression fracture S: last visit patient was still having pain from prior fracture and was using half tablet of hydrocodone every other day- had 4 pills left at that time- was significantly improving at that time   Has 1-2 pills left- uses half pill sparingly. Riding long distances is worse A/P: prior compression fracture appears to have healed.  She had back pain even before the compression fracture-I do not think her current pain is from the compression fracture.  Sparing refills can be provided on hydrocodone but I really want her to limit this as much as possible. Could consider going back to tramadol -doing some walking with support with walker.  No PT currently    From today's visit, Rx was not sent in though.

## 2021-07-17 NOTE — Telephone Encounter (Signed)
Pt called stating that Dr Durene Cal was supposed to write her a prescription for Tramadol. She wants to know if he will go ahead and write the prescription.

## 2021-07-17 NOTE — Telephone Encounter (Signed)
We discussed going back to tramadol but I did not realize she actually wanted to make that conversion-I did send this in just now-make sure if she were to take a hydrocodone to wait 8 hours before taking tramadol

## 2021-07-17 NOTE — Patient Instructions (Addendum)
Health Maintenance Due  Topic Date Due   INFLUENZA VACCINE Not available in office YET.  Please consider getting your flu shot in the Fall. If you get this outside of our office, please let us know.  07/02/2021   Please stop by lab before you go If you have mychart- we will send your results within 3 business days of [place] receiving them.  If you do not have mychart- we will call you about results within 5 business days of [place] receiving them.  *please also note that you will see labs on mychart as soon as they post. I will later go in and write notes on them- will say "notes from Dr. Durene Cal"  Please  try to elevate your leg. If redness goes up the leg or worsens, please call and let me know.  I am glad to hear you are avoiding salty foods!  Recommended follow up: Return in about 3 months (around 10/17/2021) for follow up- or sooner if needed.

## 2021-07-20 ENCOUNTER — Ambulatory Visit (INDEPENDENT_AMBULATORY_CARE_PROVIDER_SITE_OTHER): Payer: Medicare Other

## 2021-07-20 DIAGNOSIS — Z0000 Encounter for general adult medical examination without abnormal findings: Secondary | ICD-10-CM | POA: Diagnosis not present

## 2021-07-20 NOTE — Progress Notes (Signed)
Virtual Visit via Telephone Note  I connected with  Cheryl Atkinson on 07/20/21 at  9:30 AM EDT by telephone and verified that I am speaking with the correct person using two identifiers.  Medicare Annual Wellness visit completed telephonically due to Covid-19 pandemic.   Persons participating in this call: This Health Coach and this patient.   Location: Patient: Home Provider: Office   I discussed the limitations, risks, security and privacy concerns of performing an evaluation and management service by telephone and the availability of in person appointments. The patient expressed understanding and agreed to proceed.  Unable to perform video visit due to video visit attempted and failed and/or patient does not have video capability.   Some vital signs may be absent or patient reported.   Cheryl Schlein, LPN   Subjective:   Cheryl Atkinson is a 87 y.o. female who presents for Medicare Annual (Subsequent) preventive examination.  Review of Systems     Cardiac Risk Factors include: advanced age (>83men, >6 women);diabetes mellitus;hypertension;dyslipidemia     Objective:    There were no vitals filed for this visit. There is no height or weight on file to calculate BMI.  Advanced Directives 07/20/2021 07/16/2021 06/12/2021 07/14/2020 05/24/2020 05/23/2020 05/23/2020  Does Patient Have a Medical Advance Directive? Yes Yes Yes Yes Yes Yes Yes  Type of Estate agent of [employer] Power of [place];Living will;Out of facility DNR (pink MOST or yellow form) Out of facility DNR (pink MOST or yellow form) Healthcare Power of [employer] of [place];Living will Healthcare Power of [place];Living will Healthcare Power of [place];Living will  Does patient want to make changes to medical advance directive? - - No - Patient declined - No - Patient declined No - Patient declined -  Copy of Healthcare Power of Attorney in Chart? Yes - validated most  recent copy scanned in chart (See row information) - - Yes - validated most recent copy scanned in chart (See row information) No - copy requested No - copy requested No - copy requested  Would patient like information on creating a medical advance directive? - - - - - - -  Pre-existing out of facility DNR order (yellow form or pink MOST form) - - Pink MOST/Yellow Form most recent copy in chart - Physician notified to receive inpatient order - - - -    Current Medications (verified) Outpatient Encounter Medications as of 07/20/2021  Medication Sig   Albuterol Sulfate (PROAIR RESPICLICK) 108 (90 Base) MCG/ACT AEPB Inhale 2 puffs into the lungs every 6 (six) hours as needed (shortness of breath from COPD).   amLODipine (NORVASC) 5 MG tablet Take 1 tablet (5 mg total) by mouth daily.   apixaban (ELIQUIS) 5 MG TABS tablet Take 1 tablet (5 mg total) by mouth 2 (two) times daily.   CALCIUM PO Take by mouth.   carbidopa-levodopa (SINEMET IR) 25-100 MG tablet Take daily as needed for worsening RLS.   Cholecalciferol (VITAMIN D3 PO) Take by mouth.   ferrous sulfate 325 (65 FE) MG tablet Take 325 mg by mouth 2 (two) times a week. Patient takes one tablet on Monday and Friday   furosemide (LASIX) 40 MG tablet Take 1 tablet (40 mg total) by mouth 2 (two) times daily.   irbesartan (AVAPRO) 300 MG tablet Take 1 tablet (300 mg total) by mouth daily.   LINZESS 145 MCG CAPS capsule Take 145 mcg by mouth every morning.   Multiple Vitamins-Minerals (PRESERVISION AREDS 2 PO)  Take 1 tablet by mouth 2 (two) times daily.   polyethylene glycol (MIRALAX / GLYCOLAX) 17 g packet Take 17 g by mouth 2 (two) times daily. Reported on 03/14/2016 (Patient taking differently: Take 17 g by mouth daily as needed for mild constipation.)   potassium chloride SA (KLOR-CON) 20 MEQ tablet Take 1 tablet (20 mEq total) by mouth 2 (two) times daily as needed. Needs to be taken  With each dose of lasix   PROAIR RESPICLICK 108 (90 Base)  MCG/ACT AEPB Inhale 2 puffs into the lungs every 6 (six) hours as needed for shortness of breath.   rosuvastatin (CRESTOR) 40 MG tablet Take 1 tablet (40 mg total) by mouth daily. Take 1 tablet by mouth daily.   Rotigotine (NEUPRO) 3 MG/24HR PT24 PLACE 1 PATCH (3 MG) ONTO THE SKIN AT BEDTIME   traMADol (ULTRAM) 50 MG tablet Take 1 tablet (50 mg total) by mouth every 6 (six) hours as needed for moderate pain or severe pain.   traZODone (DESYREL) 50 MG tablet Take 1.5 tablets (75 mg total) by mouth at bedtime as needed for sleep.   vitamin B-12 (CYANOCOBALAMIN) 1000 MCG tablet Take 1,000 mcg by mouth daily.   No facility-administered encounter medications on file as of 07/20/2021.    Allergies (verified) Codeine and Codeine   History: Past Medical History:  Diagnosis Date   Acute cholecystitis 12/29/2019   Anemia    Arthritis    "shoulders" (05/04/2015)   Cellulitis of right lower extremity 11/27/2013   CHF (congestive heart failure) (HCC)    Chronic lower back pain    Constipation    COPD (chronic obstructive pulmonary disease) (HCC)    GERD (gastroesophageal reflux disease)    History of hiatal hernia    HLD (hyperlipidemia)    Hypertension    Insomnia    Osteoporosis    Peripheral arterial disease (HCC)    Restless leg syndrome    Thyroid disease    Past Surgical History:  Procedure Laterality Date   ABDOMINAL AORTAGRAM  05/04/2015   Procedure: Abdominal Aortagram;  Surgeon: Runell Gess, MD;  Location: [facility] INVASIVE CV LAB;  Service: Cardiovascular;;   APPENDECTOMY     CATARACT EXTRACTION W/ INTRAOCULAR LENS  IMPLANT, BILATERAL Bilateral    CHOLECYSTECTOMY N/A 04/04/2020   Procedure: LAPAROSCOPIC CHOLECYSTECTOMY;  Surgeon: Violeta Gelinas, MD;  Location: [facility] OR;  Service: General;  Laterality: N/A;   I & D EXTREMITY Right 12/22/2016   Procedure: IRRIGATION AND DEBRIDEMENT EXTREMITY;  Surgeon: Mack Hook, MD;  Location: [facility] OR;  Service: Orthopedics;  Laterality: Right;   IR  EXCHANGE BILIARY DRAIN  03/13/2020   IR PATIENT EVAL TECH 0-60 MINS  12/30/2019   IR PERC CHOLECYSTOSTOMY  01/27/2020   IR RADIOLOGIST EVAL & MGMT  05/16/2020   PERIPHERAL VASCULAR CATHETERIZATION N/A 05/04/2015   Procedure: Lower Extremity Angiography;  Surgeon: Runell Gess, MD;  Location: [facility] INVASIVE CV LAB;  Service: Cardiovascular;  Laterality: N/A;   PERIPHERAL VASCULAR CATHETERIZATION  05/04/2015   Procedure: Peripheral Vascular Intervention;  Surgeon: Runell Gess, MD;  Location: [facility] INVASIVE CV LAB;  Service: Cardiovascular;;  RCIA - 7x22 ICAST   PERIPHERAL VASCULAR CATHETERIZATION Right 09/04/2015   Procedure: Peripheral Vascular Atherectomy;  Surgeon: Runell Gess, MD;  Location: [facility] INVASIVE CV LAB;  Service: Cardiovascular;  Laterality: Right;  SFA   sfa Right 09/04/2015   de balloon   THYROID SURGERY Right ?2013   "had goiter taken off my neck"   TONSILLECTOMY  VAGINAL HYSTERECTOMY     Family History  Problem Relation Age of Onset   Heart disease Mother    Heart disease Father    Heart attack Father    Cancer Sister        Breast Cancer   Atrial fibrillation Sister    Stroke Maternal Grandmother    Cancer Paternal Grandmother    Coronary artery disease Other    Social History   Socioeconomic History   Marital status: Widowed    Spouse name: Not on file   Number of children: Not on file   Years of education: Not on file   Highest education level: Not on file  Occupational History   Occupation: retired  Tobacco Use   Smoking status: Former    Packs/day: 0.50    Years: 60.00    Pack years: 30.00    Types: Cigarettes    Quit date: 04/02/2015    Years since quitting: 6.3   Smokeless tobacco: Never  Vaping Use   Vaping Use: Never used  Substance and Sexual Activity   Alcohol use: No   Drug use: No   Sexual activity: Not Currently  Other Topics Concern   Not on file  Social History Narrative   ** Merged History Encounter ** Widowed 2013. 2 sons. 1  grandchild.    Son can help on weekends. Sister and sister in law could help as well. Thinks she may stop driving in next few years.       Retired from [employer].       Hobbies: time at home and with family      HCPOA: sister-in-law and brother. Deirdre Evener.       DNR/DNI      Regular exercise: none   Caffeine use: cup of coffee    Lives in a one story home    Right Handed    Social Determinants of Health   Financial Resource Strain: Low Risk    Difficulty of Paying Living Expenses: Not hard at all  Food Insecurity: No Food Insecurity   Worried About Programme researcher, broadcasting/film/video in the Last Year: Never true   Ran Out of Food in the Last Year: Never true  Transportation Needs: No Transportation Needs   Lack of Transportation (Medical): No   Lack of Transportation (Non-Medical): No  Physical Activity: Inactive   Days of Exercise per Week: 0 days   Minutes of Exercise per Session: 0 min  Stress: No Stress Concern Present   Feeling of Stress : Not at all  Social Connections: Moderately Isolated   Frequency of Communication with Friends and Family: More than three times a week   Frequency of Social Gatherings with Friends and Family: Not on file   Attends Religious Services: 1 to 4 times per year   Active Member of [employer] or Organizations: No   Attends Banker Meetings: Never   Marital Status: Widowed    Tobacco Counseling Counseling given: Not Answered   Clinical Intake:  Pre-visit preparation completed: Yes  Pain : No/denies pain     BMI - recorded: 21.99 Nutritional Status: BMI of 19-24  Normal Nutritional Risks: None Diabetes: Yes CBG done?: No Did pt. bring in CBG monitor from home?: No  How often do you need to have someone help you when you read instructions, pamphlets, or other written materials from your doctor or pharmacy?: 1 - Never  Diabetic?Nutrition Risk Assessment:  Has the patient had any N/V/D within the last  2 months?  No  Does the  patient have any non-healing wounds?  No  Has the patient had any unintentional weight loss or weight gain?  No   Diabetes:  Is the patient diabetic?  Yes  If diabetic, was a CBG obtained today?  No  Did the patient bring in their glucometer from home?  No  How often do you monitor your CBG's? N/A.   Financial Strains and Diabetes Management:  Are you having any financial strains with the device, your supplies or your medication? No .  Does the patient want to be seen by Chronic Care Management for management of their diabetes?  No  Would the patient like to be referred to a Nutritionist or for Diabetic Management?  No   Diabetic Exams:  Diabetic Eye Exam: Completed 08/21/20 Diabetic Foot Exam: Completed 07/17/21   Interpreter Needed?: No  Information entered by :: Lanier Ensignina Zyiere Rosemond, LPN   Activities of Daily Living In your present state of health, do you have any difficulty performing the following activities: 07/20/2021 07/17/2021  Hearing? Malvin JohnsY Y  Comment HOH -  Vision? N Y  Difficulty concentrating or making decisions? Malvin JohnsY Y  Walking or climbing stairs? Y Y  Comment unable to do that -  Dressing or bathing? Malvin JohnsY Y  Comment care giver and son -  Doing errands, shopping? Malvin JohnsY Y  Preparing Food and eating ? Y -  Comment care giver and son -  Using the Toilet? N -  In the past six months, have you accidently leaked urine? N -  Do you have problems with loss of bowel control? N -  Managing your Medications? N -  Managing your Finances? N -  Housekeeping or managing your Housekeeping? N -  Some recent data might be hidden    Patient Care Team: Shelva MajesticHunter, Stephen O, MD as PCP - General (Family Medicine) Runell GessBerry, Jonathan J, MD as PCP - Cardiology (Cardiology) Runell GessBerry, Jonathan J, MD as Consulting Physician (Cardiology) Letta KocherSaullo, Thomas, MD as Consulting Physician (Rehabilitation) Sherrie GeorgeMatthews, John D, MD as Consulting Physician (Ophthalmology) Violeta Gelinashompson, Burke, MD as Consulting Physician (General  Surgery) Shelva MajesticHunter, Stephen O, MD (Family Medicine) Maple Mirzaarpley, Farrah D, RN as Case Manager  Indicate any recent Medical Services you may have received from other than [facility] providers in the past year (date may be approximate).     Assessment:   This is a routine wellness examination for Kathie RhodesBetty.  Hearing/Vision screen Hearing Screening - Comments:: Pt stated HOH  Vision Screening - Comments:: Pt follows up with [facility] for annual eye exams   Dietary issues and exercise activities discussed: Current Exercise Habits: The patient does not participate in regular exercise at present   Goals Addressed             This Visit's Progress    Patient Stated       Get off this walker        Depression Screen PHQ 2/9 Scores 07/20/2021 07/17/2021 06/12/2021 04/04/2021 12/04/2020 12/04/2020 07/14/2020  PHQ - 2 Score 0 0 0 0 0 0 0  PHQ- 9 Score - 2 - 8 1 - -    Fall Risk Fall Risk  07/20/2021 07/16/2021 06/12/2021 05/09/2021 12/04/2020  Falls in the past year? 0 1 1 0 0  Comment - - - - -  Number falls in past yr: 0 0 0 0 -  Comment - - fell 01/2021 - -  Injury with Fall? 0 1 1 0 -  Comment - - "  hurt back" - -  Risk for fall due to : Impaired vision;Impaired mobility;Impaired balance/gait - History of fall(s);Impaired balance/gait;Impaired mobility;Impaired vision;Medication side effect;Orthopedic patient - -  Risk for fall due to: Comment - - - - -  Follow up Falls prevention discussed - Falls evaluation completed;Education provided;Falls prevention discussed - -    FALL RISK PREVENTION PERTAINING TO THE HOME:  Any stairs in or around the home? No  If so, are there any without handrails? No  Home free of loose throw rugs in walkways, pet beds, electrical cords, etc? Yes  Adequate lighting in your home to reduce risk of falls? Yes   ASSISTIVE DEVICES UTILIZED TO PREVENT FALLS:  Life alert? Yes  Use of a cane, walker or w/c? Yes  Grab bars in the bathroom? Yes  Shower chair or bench in shower?  Yes  Elevated toilet seat or a handicapped toilet? Yes   TIMED UP AND GO:  Was the test performed? No .   Cognitive Function:Declined  MMSE - Mini Mental State Exam 05/15/2017  Orientation to time 5  Orientation to Place 5  Registration 3  Attention/ Calculation 5  Recall 1  Language- name 2 objects 2  Language- repeat 1  Language- follow 3 step command 3  Language- read & follow direction 1  Write a sentence 1  Copy design 0  Total score 27     6CIT Screen 07/14/2020 07/14/2019 07/07/2018  What Year? 0 points 0 points 0 points  What month? 0 points 0 points 0 points  What time? - 0 points 0 points  Count back from 20 0 points 0 points 0 points  Months in reverse 4 points 0 points 4 points  Repeat phrase 6 points - -    Immunizations Immunization History  Administered Date(s) Administered   Influenza Split 08/20/2011, 08/28/2012, 09/29/2012   Influenza Whole 09/25/2007, 09/20/2009   Influenza, High Dose Seasonal PF 09/03/2014, 08/19/2017, 08/18/2018, 09/16/2019   Influenza,inj,Quad PF,6+ Mos 08/30/2013, 09/05/2015   Influenza-Unspecified 08/22/2016, 08/29/2020   PFIZER(Purple Top)SARS-COV-2 Vaccination 03/04/2020, 03/29/2020   Pneumococcal Conjugate-13 09/29/2014   Pneumococcal Polysaccharide-23 12/02/2005   Td 11/01/1993   Tdap 11/27/2013, 01/14/2021   Zoster, Live 02/17/2007    TDAP status: Up to date  Flu Vaccine status: Due, Education has been provided regarding the importance of this vaccine. Advised may receive this vaccine at local pharmacy or Health Dept. Aware to provide a copy of the vaccination record if obtained from local pharmacy or Health Dept. Verbalized acceptance and understanding.  Pneumococcal vaccine status: Up to date  Covid-19 vaccine status: Completed vaccines  Qualifies for Shingles Vaccine? Yes   Zostavax completed Yes   Shingrix Completed?: No.    Education has been provided regarding the importance of this vaccine. Patient has been  advised to call insurance company to determine out of pocket expense if they have not yet received this vaccine. Advised may also receive vaccine at local pharmacy or Health Dept. Verbalized acceptance and understanding.  Screening Tests Health Maintenance  Topic Date Due   INFLUENZA VACCINE  07/02/2021   OPHTHALMOLOGY EXAM  08/21/2021   HEMOGLOBIN A1C  01/17/2022   FOOT EXAM  07/17/2022   TETANUS/TDAP  01/14/2031   DEXA SCAN  Completed   PNA vac Low Risk Adult  Completed   HPV VACCINES  Aged Out   COVID-19 Vaccine  Discontinued   Zoster Vaccines- Shingrix  Discontinued    Health Maintenance  Health Maintenance Due  Topic Date Due  INFLUENZA VACCINE  07/02/2021    Colorectal cancer screening: No longer required.   Mammogram status: No longer required due to age.  Bone Density status: Completed 02/20/21. Results reflect: Bone density results: OSTEOPOROSIS. Repeat every 2 years.    Additional Screening:   Vision Screening: Recommended annual ophthalmology exams for early detection of glaucoma and other disorders of the eye. Is the patient up to date with their annual eye exam?  Yes  Who is the provider or what is the name of the office in which the patient attends annual eye exams? [facility] If pt is not established with a provider, would they like to be referred to a provider to establish care? No .   Dental Screening: Recommended annual dental exams for proper oral hygiene  Community Resource Referral / Chronic Care Management: CRR required this visit?  No   CCM required this visit?  No      Plan:     I have personally reviewed and noted the following in the patient's chart:   Medical and social history Use of alcohol, tobacco or illicit drugs  Current medications and supplements including opioid prescriptions.  Functional ability and status Nutritional status Physical activity Advanced directives List of other physicians Hospitalizations, surgeries, and  ER visits in previous 12 months Vitals Screenings to include cognitive, depression, and falls Referrals and appointments  In addition, I have reviewed and discussed with patient certain preventive protocols, quality metrics, and best practice recommendations. A written personalized care plan for preventive services as well as general preventive health recommendations were provided to patient.     Cheryl Schlein, LPN   [id number]/61/[id number]   Nurse Notes: None

## 2021-07-20 NOTE — Patient Instructions (Addendum)
Cheryl Atkinson , Thank you for taking time to come for your Medicare Wellness Visit. I appreciate your ongoing commitment to your health goals. Please review the following plan we discussed and let me know if I can assist you in the future.   Screening recommendations/referrals: Colonoscopy: No Longer required  Mammogram: No longer required  Bone Density: Done 02/20/21 repeat every 2 years  Recommended yearly ophthalmology/optometry visit for glaucoma screening and checkup Recommended yearly dental visit for hygiene and checkup  Vaccinations: Influenza vaccine: Due  Pneumococcal vaccine: Completed  Tdap vaccine: Done 01/14/21 Up to date Shingles vaccine: Shingrix discussed. Please contact your pharmacy for coverage information.    Covid-19:Completed 4/3 & 03/29/20  Advanced directives: Copies in chart   Conditions/risks identified: Get off this walker   Next appointment: Follow up in one year for your annual wellness visit    Preventive Care 65 Years and Older, Female Preventive care refers to lifestyle choices and visits with your health care provider that can promote health and wellness. What does preventive care include? A yearly physical exam. This is also called an annual well check. Dental exams once or twice a year. Routine eye exams. Ask your health care provider how often you should have your eyes checked. Personal lifestyle choices, including: Daily care of your teeth and gums. Regular physical activity. Eating a healthy diet. Avoiding tobacco and drug use. Limiting alcohol use. Practicing safe sex. Taking low-dose aspirin every day. Taking vitamin and mineral supplements as recommended by your health care provider. What happens during an annual well check? The services and screenings done by your health care provider during your annual well check will depend on your age, overall health, lifestyle risk factors, and family history of disease. Counseling  Your health care  provider may ask you questions about your: Alcohol use. Tobacco use. Drug use. Emotional well-being. Home and relationship well-being. Sexual activity. Eating habits. History of falls. Memory and ability to understand (cognition). Work and work Astronomer. Reproductive health. Screening  You may have the following tests or measurements: Height, weight, and BMI. Blood pressure. Lipid and cholesterol levels. These may be checked every 5 years, or more frequently if you are over 68 years old. Skin check. Lung cancer screening. You may have this screening every year starting at age 89 if you have a 30-pack-year history of smoking and currently smoke or have quit within the past 15 years. Fecal occult blood test (FOBT) of the stool. You may have this test every year starting at age 17. Flexible sigmoidoscopy or colonoscopy. You may have a sigmoidoscopy every 5 years or a colonoscopy every 10 years starting at age 15. Hepatitis C blood test. Hepatitis B blood test. Sexually transmitted disease (STD) testing. Diabetes screening. This is done by checking your blood sugar (glucose) after you have not eaten for a while (fasting). You may have this done every 1-3 years. Bone density scan. This is done to screen for osteoporosis. You may have this done starting at age 3. Mammogram. This may be done every 1-2 years. Talk to your health care provider about how often you should have regular mammograms. Talk with your health care provider about your test results, treatment options, and if necessary, the need for more tests. Vaccines  Your health care provider may recommend certain vaccines, such as: Influenza vaccine. This is recommended every year. Tetanus, diphtheria, and acellular pertussis (Tdap, Td) vaccine. You may need a Td booster every 10 years. Zoster vaccine. You may need this after  age 30. Pneumococcal 13-valent conjugate (PCV13) vaccine. One dose is recommended after age  80. Pneumococcal polysaccharide (PPSV23) vaccine. One dose is recommended after age 30. Talk to your health care provider about which screenings and vaccines you need and how often you need them. This information is not intended to replace advice given to you by your health care provider. Make sure you discuss any questions you have with your health care provider. Document Released: 12/15/2015 Document Revised: 08/07/2016 Document Reviewed: 09/19/2015 Elsevier Interactive Patient Education  2017 [place] Prevention in the Home Falls can cause injuries. They can happen to people of all ages. There are many things you can do to make your home safe and to help prevent falls. What can I do on the outside of my home? Regularly fix the edges of walkways and driveways and fix any cracks. Remove anything that might make you trip as you walk through a door, such as a raised step or threshold. Trim any bushes or trees on the path to your home. Use bright outdoor lighting. Clear any walking paths of anything that might make someone trip, such as rocks or tools. Regularly check to see if handrails are loose or broken. Make sure that both sides of any steps have handrails. Any raised decks and porches should have guardrails on the edges. Have any leaves, snow, or ice cleared regularly. Use sand or salt on walking paths during winter. Clean up any spills in your garage right away. This includes oil or grease spills. What can I do in the bathroom? Use night lights. Install grab bars by the toilet and in the tub and shower. Do not use towel bars as grab bars. Use non-skid mats or decals in the tub or shower. If you need to sit down in the shower, use a plastic, non-slip stool. Keep the floor dry. Clean up any water that spills on the floor as soon as it happens. Remove soap buildup in the tub or shower regularly. Attach bath mats securely with double-sided non-slip rug tape. Do not have throw  rugs and other things on the floor that can make you trip. What can I do in the bedroom? Use night lights. Make sure that you have a light by your bed that is easy to reach. Do not use any sheets or blankets that are too big for your bed. They should not hang down onto the floor. Have a firm chair that has side arms. You can use this for support while you get dressed. Do not have throw rugs and other things on the floor that can make you trip. What can I do in the kitchen? Clean up any spills right away. Avoid walking on wet floors. Keep items that you use a lot in easy-to-reach places. If you need to reach something above you, use a strong step stool that has a grab bar. Keep electrical cords out of the way. Do not use floor polish or wax that makes floors slippery. If you must use wax, use non-skid floor wax. Do not have throw rugs and other things on the floor that can make you trip. What can I do with my stairs? Do not leave any items on the stairs. Make sure that there are handrails on both sides of the stairs and use them. Fix handrails that are broken or loose. Make sure that handrails are as long as the stairways. Check any carpeting to make sure that it is firmly attached to the stairs.  Fix any carpet that is loose or worn. Avoid having throw rugs at the top or bottom of the stairs. If you do have throw rugs, attach them to the floor with carpet tape. Make sure that you have a light switch at the top of the stairs and the bottom of the stairs. If you do not have them, ask someone to add them for you. What else can I do to help prevent falls? Wear shoes that: Do not have high heels. Have rubber bottoms. Are comfortable and fit you well. Are closed at the toe. Do not wear sandals. If you use a stepladder: Make sure that it is fully opened. Do not climb a closed stepladder. Make sure that both sides of the stepladder are locked into place. Ask someone to hold it for you, if  possible. Clearly mark and make sure that you can see: Any grab bars or handrails. First and last steps. Where the edge of each step is. Use tools that help you move around (mobility aids) if they are needed. These include: Canes. Walkers. Scooters. Crutches. Turn on the lights when you go into a dark area. Replace any light bulbs as soon as they burn out. Set up your furniture so you have a clear path. Avoid moving your furniture around. If any of your floors are uneven, fix them. If there are any pets around you, be aware of where they are. Review your medicines with your doctor. Some medicines can make you feel dizzy. This can increase your chance of falling. Ask your doctor what other things that you can do to help prevent falls. This information is not intended to replace advice given to you by your health care provider. Make sure you discuss any questions you have with your health care provider. Document Released: 09/14/2009 Document Revised: 04/25/2016 Document Reviewed: 12/23/2014 Elsevier Interactive Patient Education  2017 [employer].

## 2021-07-24 ENCOUNTER — Emergency Department (HOSPITAL_COMMUNITY): Payer: Medicare Other

## 2021-07-24 ENCOUNTER — Encounter (INDEPENDENT_AMBULATORY_CARE_PROVIDER_SITE_OTHER): Payer: Medicare Other | Admitting: Ophthalmology

## 2021-07-24 ENCOUNTER — Emergency Department (HOSPITAL_COMMUNITY)
Admission: EM | Admit: 2021-07-24 | Discharge: 2021-07-24 | Disposition: A | Discharge status: Home / Self Care | Payer: Medicare Other | Attending: Emergency Medicine | Admitting: Emergency Medicine

## 2021-07-24 ENCOUNTER — Other Ambulatory Visit: Payer: Self-pay

## 2021-07-24 ENCOUNTER — Telehealth: Payer: Medicare Other

## 2021-07-24 DIAGNOSIS — S4991XA Unspecified injury of right shoulder and upper arm, initial encounter: Secondary | ICD-10-CM | POA: Diagnosis present

## 2021-07-24 DIAGNOSIS — H353112 Nonexudative age-related macular degeneration, right eye, intermediate dry stage: Secondary | ICD-10-CM

## 2021-07-24 DIAGNOSIS — S62646A Nondisplaced fracture of proximal phalanx of right little finger, initial encounter for closed fracture: Secondary | ICD-10-CM

## 2021-07-24 DIAGNOSIS — S62616A Displaced fracture of proximal phalanx of right little finger, initial encounter for closed fracture: Secondary | ICD-10-CM | POA: Diagnosis not present

## 2021-07-24 DIAGNOSIS — E049 Nontoxic goiter, unspecified: Secondary | ICD-10-CM | POA: Diagnosis not present

## 2021-07-24 DIAGNOSIS — I10 Essential (primary) hypertension: Secondary | ICD-10-CM | POA: Diagnosis not present

## 2021-07-24 DIAGNOSIS — H35033 Hypertensive retinopathy, bilateral: Secondary | ICD-10-CM | POA: Diagnosis not present

## 2021-07-24 DIAGNOSIS — S62644A Nondisplaced fracture of proximal phalanx of right ring finger, initial encounter for closed fracture: Secondary | ICD-10-CM | POA: Insufficient documentation

## 2021-07-24 DIAGNOSIS — H353221 Exudative age-related macular degeneration, left eye, with active choroidal neovascularization: Secondary | ICD-10-CM | POA: Diagnosis not present

## 2021-07-24 DIAGNOSIS — Z043 Encounter for examination and observation following other accident: Secondary | ICD-10-CM | POA: Diagnosis not present

## 2021-07-24 DIAGNOSIS — Z515 Encounter for palliative care: Secondary | ICD-10-CM

## 2021-07-24 DIAGNOSIS — I509 Heart failure, unspecified: Secondary | ICD-10-CM | POA: Insufficient documentation

## 2021-07-24 DIAGNOSIS — W19XXXA Unspecified fall, initial encounter: Secondary | ICD-10-CM | POA: Diagnosis not present

## 2021-07-24 DIAGNOSIS — I6523 Occlusion and stenosis of bilateral carotid arteries: Secondary | ICD-10-CM | POA: Diagnosis not present

## 2021-07-24 DIAGNOSIS — S42031A Displaced fracture of lateral end of right clavicle, initial encounter for closed fracture: Secondary | ICD-10-CM

## 2021-07-24 DIAGNOSIS — S0990XA Unspecified injury of head, initial encounter: Secondary | ICD-10-CM | POA: Insufficient documentation

## 2021-07-24 DIAGNOSIS — M25519 Pain in unspecified shoulder: Secondary | ICD-10-CM | POA: Diagnosis not present

## 2021-07-24 DIAGNOSIS — I672 Cerebral atherosclerosis: Secondary | ICD-10-CM | POA: Diagnosis not present

## 2021-07-24 DIAGNOSIS — M810 Age-related osteoporosis without current pathological fracture: Secondary | ICD-10-CM | POA: Diagnosis not present

## 2021-07-24 DIAGNOSIS — M25511 Pain in right shoulder: Secondary | ICD-10-CM | POA: Insufficient documentation

## 2021-07-24 DIAGNOSIS — M549 Dorsalgia, unspecified: Secondary | ICD-10-CM | POA: Diagnosis not present

## 2021-07-24 DIAGNOSIS — I110 Hypertensive heart disease with heart failure: Secondary | ICD-10-CM | POA: Insufficient documentation

## 2021-07-24 DIAGNOSIS — H43813 Vitreous degeneration, bilateral: Secondary | ICD-10-CM

## 2021-07-24 DIAGNOSIS — S199XXA Unspecified injury of neck, initial encounter: Secondary | ICD-10-CM | POA: Diagnosis not present

## 2021-07-24 DIAGNOSIS — M5030 Other cervical disc degeneration, unspecified cervical region: Secondary | ICD-10-CM | POA: Diagnosis not present

## 2021-07-24 MED ORDER — HYDROCODONE-ACETAMINOPHEN 5-325 MG PO TABS: 1.0000 | ORAL_TABLET | Freq: Four times a day (QID) | ORAL | 0 refills | Status: DC | PRN

## 2021-07-24 MED ORDER — FENTANYL CITRATE (PF) 100 MCG/2ML IJ SOLN: 100.0000 ug | INTRAMUSCULAR | Status: DC | PRN

## 2021-07-24 MED ORDER — FENTANYL CITRATE PF 50 MCG/ML IJ SOSY
100.0000 ug | PREFILLED_SYRINGE | INTRAMUSCULAR | Status: DC | PRN
  Administered 2021-07-24: 100 ug via INTRAVENOUS
  Filled 2021-07-24: qty 2

## 2021-07-24 MED ORDER — HYDROCODONE-ACETAMINOPHEN 5-325 MG PO TABS
1.0000 | ORAL_TABLET | Freq: Once | ORAL | Status: AC
  Administered 2021-07-24: 1 via ORAL
  Filled 2021-07-24: qty 1

## 2021-07-24 NOTE — ED Provider Notes (Signed)
[facility] Provider Note   CSN: [id number] Arrival date & time: 07/24/21  1609     History Chief Complaint  Patient presents with   Cheryl Atkinson    [name] is a 87 y.o. female.  HPI Presents as a level 2 trauma.  She arrives via EMS, and history is obtained by the patient and those individuals. Patient was in her usual state of health, take medication as directed including anticoagulant for history of A. fib.  She typically uses a walker, was doing so, but stumbled leaving the bathroom.  She fell onto her right shoulder, denies head trauma.  Since that time she has had severe pain in the right shoulder, worse with motion, minimally improved with fentanyl, 100 mcg. Currently no head pain, neck pain, weakness in any extremity, no pain anywhere else. Hemodynamically she was unremarkable according to EMS providers.    Past medical: Essential hypertension Atrial fibrillation (HCC) Atherosclerotic PVD with intermittent claudication (HCC) Renal artery stenosis (HCC) Abdominal aortic ectasia (HCC) Heart failure with preserved ejection fraction [facility](HCC)   Past surgical and family:Surgical History    16 items 05/16/2020  Ir radiologist eval & mgmt 04/04/2020  Cholecystectomy (N/A)  03/13/2020  Ir exchange biliary drain 01/27/2020  Ir perc cholecystostomy 12/30/2019  Ir patient eval tech 0-60 mins 12/22/2016  I & d extremity (Right)  09/04/2015  sfa [Other] (Right)  09/04/2015  Peripheral vascular catheterization (Right)  05/04/2015  Peripheral vascular catheterization (N/A)  05/04/2015  Abdominal aortagram  05/04/2015  Peripheral vascular catheterization  Date Unknown  Appendectomy Date Unknown  Cataract extraction w/ intraocular lens  implant, bilateral (Bilateral) ?2013  Thyroid surgery (Right)  Date Unknown  Tonsillectomy Date Unknown  Vaginal hysterectomy   Family History    11 items       Mother (Deceased) Heart  disease  Father (Deceased) Heart disease   Heart attack  Sister Cancer    Atrial fibrillation  Brother   Maternal Grandmother (Deceased) Stroke  Maternal Grandfather (Deceased)   Paternal Grandmother (Deceased) Cancer  Paternal Grandfather (Deceased)   Son   Son   Other Coronary artery disease    OB History   No obstetric history on file.     No family history on file.     Home Medications Prior to Admission medications   Not on File    Allergies    Codeine  Review of Systems   Review of Systems  Constitutional:        Per HPI, otherwise negative  HENT:         Per HPI, otherwise negative  Respiratory:         Per HPI, otherwise negative  Cardiovascular:        Per HPI, otherwise negative  Gastrointestinal:  Negative for vomiting.  Endocrine:       Negative aside from HPI  Genitourinary:        Neg aside from HPI   Musculoskeletal:        Per HPI, otherwise negative  Skin: Negative.   Neurological:  Negative for syncope.   Physical Exam Updated Vital Signs BP (!) 165/90   Pulse 64   Temp 98.7 F (37.1 C) (Oral)   Resp 14   SpO2 96%   Physical Exam Vitals and nursing note reviewed.  Constitutional:      General: She is not in acute distress.    Appearance: She is well-developed.  HENT:     Head: Normocephalic and  atraumatic.  Eyes:     Conjunctiva/sclera: Conjunctivae normal.  Cardiovascular:     Rate and Rhythm: Normal rate and regular rhythm.  Pulmonary:     Effort: Pulmonary effort is normal. No respiratory distress.     Breath sounds: Normal breath sounds. No stridor.  Abdominal:     General: There is no distension.  Musculoskeletal:     Cervical back: No rigidity or tenderness.     Comments: Pelvis stable, lower extremities unremarkable including flexion and extension of both hips, knees, ankles.  Left arm unremarkable.  Right arm with deformity, crepitus, tenderness to palpation proximally, elbow grossly unremarkable, though pain in  the elbow with range of motion of anything in the hand, wrist, shoulder.  Hand and wrist unremarkable.  Skin:    General: Skin is warm and dry.  Neurological:     Mental Status: She is alert and oriented to person, place, and time.     Cranial Nerves: No cranial nerve deficit.     Motor: Weakness and atrophy present. No tremor or abnormal muscle tone.    ED Results / Procedures / Treatments   Labs (all labs ordered are listed, but only abnormal results are displayed) Labs Reviewed - No data to display  EKG EKG Interpretation  Date/Time:  Tuesday July 24 2021 16:11:14 EDT Ventricular Rate:  78 PR Interval:    QRS Duration: 112 QT Interval:  405 QTC Calculation: 462 R Axis:   -12 Text Interpretation: Atrial fibrillation Borderline intraventricular conduction delay Abnormal R-wave progression, late transition Abnormal ECG Confirmed by Gerhard Munch [phone]) on 07/24/2021 6:09:06 PM  Radiology DG Shoulder Right  Result Date: 07/24/2021 CLINICAL DATA:  Fall. EXAM: RIGHT SHOULDER - 2+ VIEW COMPARISON:  Right shoulder x-rays dated December 13, 2013. FINDINGS: Acute comminuted fracture of the distal clavicle with mild displacement. Acromioclavicular joint remains intact. No dislocation. Unchanged enchondroma in the right proximal humerus. Osteopenia. IMPRESSION: 1. Acute mildly displaced distal clavicle fracture. Electronically Signed   By: Obie Dredge M.D.   On: 07/24/2021 17:31   DG Elbow Complete Right  Result Date: 07/24/2021 CLINICAL DATA:  Fall. EXAM: RIGHT ELBOW - COMPLETE 3+ VIEW COMPARISON:  None. FINDINGS: There is no evidence of fracture, dislocation, or joint effusion. There is no evidence of arthropathy or other focal bone abnormality. Osteopenia. Soft tissues are unremarkable. IMPRESSION: Negative. Electronically Signed   By: Obie Dredge M.D.   On: 07/24/2021 17:27   CT Head Wo Contrast  Result Date: 07/24/2021 CLINICAL DATA:  Trauma, fall, injury to RIGHT  shoulder, head/C-spine injury suspected, patient says did not strike head. EXAM: CT HEAD WITHOUT CONTRAST CT CERVICAL SPINE WITHOUT CONTRAST TECHNIQUE: Multidetector CT imaging of the head and cervical spine was performed following the standard protocol without intravenous contrast. Multiplanar CT image reconstructions of the cervical spine were also generated. COMPARISON:  CT head and cervical spine 01/14/2021 FINDINGS: CT HEAD FINDINGS Brain: Generalized atrophy. Normal ventricular morphology. No midline shift or mass effect. Small vessel chronic ischemic changes of deep cerebral white matter. No intracranial hemorrhage, mass lesion, evidence of acute infarction, or extra-axial fluid collection. Vascular: Atherosclerotic calcifications of internal carotid arteries at skull base Skull: Demineralized but intact Sinuses/Orbits: Small amount of fluid/mucous in sphenoid sinus. Other: N/A CT CERVICAL SPINE FINDINGS Alignment: Normal Skull base and vertebrae: Diffuse osseous demineralization. Large arachnoid granulation of the occipital bone. Skull base intact. Vertebral body and disc space heights maintained. No fracture, subluxation, or bone destruction. Questionable vertebral hemangioma of T1 unchanged. Soft  tissues and spinal canal: Thickening of prevertebral soft tissues anterior to RIGHT C2 vertebral body unchanged since 01/14/2021. Area in question measures approximately 1.8 x 1.0 cm image 39. This could represent mass or adenopathy. Chronic asymmetric enlargement of RIGHT thyroid lobe unchanged. Disc levels:  No specific abnormalities Upper chest: Mild emphysematous changes, otherwise clear Other: Fracture RIGHT clavicle seen at margin of exam. IMPRESSION: Atrophy with small vessel chronic ischemic changes of deep cerebral white matter. No acute intracranial abnormalities. Osseous demineralization with degenerative disc and facet disease changes of the cervical spine. No acute cervical spine abnormalities.  Fractured RIGHT clavicle at margin of exam. Chronic asymmetric soft tissue thickening/fullness anterior to RIGHT C2, question mass or adenopathy approximately 1.8 x 1.0 cm; follow-up characterization by non emergent CT imaging with IV contrast recommended, if clinically indicated based on patient age and comorbidities. Electronically Signed   By: Ulyses Southward M.D.   On: 07/24/2021 17:50   CT Cervical Spine Wo Contrast  Result Date: 07/24/2021 CLINICAL DATA:  Trauma, fall, injury to RIGHT shoulder, head/C-spine injury suspected, patient says did not strike head. EXAM: CT HEAD WITHOUT CONTRAST CT CERVICAL SPINE WITHOUT CONTRAST TECHNIQUE: Multidetector CT imaging of the head and cervical spine was performed following the standard protocol without intravenous contrast. Multiplanar CT image reconstructions of the cervical spine were also generated. COMPARISON:  CT head and cervical spine 01/14/2021 FINDINGS: CT HEAD FINDINGS Brain: Generalized atrophy. Normal ventricular morphology. No midline shift or mass effect. Small vessel chronic ischemic changes of deep cerebral white matter. No intracranial hemorrhage, mass lesion, evidence of acute infarction, or extra-axial fluid collection. Vascular: Atherosclerotic calcifications of internal carotid arteries at skull base Skull: Demineralized but intact Sinuses/Orbits: Small amount of fluid/mucous in sphenoid sinus. Other: N/A CT CERVICAL SPINE FINDINGS Alignment: Normal Skull base and vertebrae: Diffuse osseous demineralization. Large arachnoid granulation of the occipital bone. Skull base intact. Vertebral body and disc space heights maintained. No fracture, subluxation, or bone destruction. Questionable vertebral hemangioma of T1 unchanged. Soft tissues and spinal canal: Thickening of prevertebral soft tissues anterior to RIGHT C2 vertebral body unchanged since 01/14/2021. Area in question measures approximately 1.8 x 1.0 cm image 39. This could represent mass or  adenopathy. Chronic asymmetric enlargement of RIGHT thyroid lobe unchanged. Disc levels:  No specific abnormalities Upper chest: Mild emphysematous changes, otherwise clear Other: Fracture RIGHT clavicle seen at margin of exam. IMPRESSION: Atrophy with small vessel chronic ischemic changes of deep cerebral white matter. No acute intracranial abnormalities. Osseous demineralization with degenerative disc and facet disease changes of the cervical spine. No acute cervical spine abnormalities. Fractured RIGHT clavicle at margin of exam. Chronic asymmetric soft tissue thickening/fullness anterior to RIGHT C2, question mass or adenopathy approximately 1.8 x 1.0 cm; follow-up characterization by non emergent CT imaging with IV contrast recommended, if clinically indicated based on patient age and comorbidities. Electronically Signed   By: Ulyses Southward M.D.   On: 07/24/2021 17:50   DG Hand 2 View Right  Result Date: 07/24/2021 CLINICAL DATA:  Fall. EXAM: RIGHT HAND - 2 VIEW COMPARISON:  None. FINDINGS: Diffuse bone demineralization. Degenerative changes in the interphalangeal joints, first and second metacarpal phalangeal joints, first carpometacarpal, radiocarpal, and STT joints. There is an acute fracture of the proximal phalanx of the fifth finger with dorsal angulation of the distal fracture fragment. No obvious articular involvement. There is focal sclerosis in the distal phalanx of the third finger, likely representing benign bone island. IMPRESSION: 1. Acute fracture of the proximal phalanx right  fifth finger. 2. Diffuse bone demineralization and degenerative changes. 3. Sclerosis in the distal phalanx of the third finger, likely benign. Electronically Signed   By: Burman Nieves M.D.   On: 07/24/2021 19:27    Procedures Procedures   Medications Ordered in ED Medications  fentaNYL (SUBLIMAZE) injection 100 mcg (100 mcg Intravenous Given 07/24/21 1631)  HYDROcodone-acetaminophen (NORCO/VICODIN) 5-325 MG  per tablet 1 tablet (1 tablet Oral Given 07/24/21 1828)    ED Course  I have reviewed the triage vital signs and the nursing notes.  Pertinent labs & imaging results that were available during my care of the patient were reviewed by me and considered in my medical decision making (see chart for details).  Update:, Patient in no distress, hemodynamically unremarkable.  Now on repeat exam and discussing her initial x-ray is notable for clavicle fracture which I reviewed, and discussed with our orthopedic physician, the patient complains of pain in the right medial hand and finger.  No loss of sensation or inability to move the digit.   8:19 PM Patient has had splint application, ulnar gutter, right side, has been placed in immobilization, with a sling on the right side as well. He is aware of all findings, and with otherwise reassuring evaluation including no loss of neurovascular status, no intracranial or cervical fracture or bleed, the patient is appropriate for discharge.  She notes that she has a wheelchair at home, and has a health aide who can help her take care of herself.  MDM Rules/Calculators/A&P MDM Number of Diagnoses or Management Options Closed displaced fracture of acromial end of right clavicle, initial encounter: new, needed workup Closed nondisplaced fracture of proximal phalanx of right little finger, initial encounter: new, needed workup Fall, initial encounter: new, needed workup   Amount and/or Complexity of Data Reviewed Tests in the radiology section of CPT: ordered and reviewed Decide to obtain previous medical records or to obtain history from someone other than the patient: yes Review and summarize past medical records: yes Independent visualization of images, tracings, or specimens: yes  Risk of Complications, Morbidity, and/or Mortality Presenting problems: high Diagnostic procedures: high Management options: high  Critical Care Total time providing  critical care: < 30 minutes  Patient Progress Patient progress: stable   Final Clinical Impression(s) / ED Diagnoses Final diagnoses:  Fall, initial encounter  Closed displaced fracture of acromial end of right clavicle, initial encounter  Closed nondisplaced fracture of proximal phalanx of right little finger, initial encounter    Rx / DC Orders ED Discharge Orders     None        Gerhard Munch, MD 07/25/21 0005

## 2021-07-24 NOTE — Discharge Instructions (Addendum)
As discussed, you have been diagnosed with a fracture of your clavicle, and your right little finger.  Please be sure to schedule follow-up with our orthopedic colleague in about 1 week.  In the interim, please use Tylenol for pain control.  If your pain does not respond to Tylenol you may substitute a single dose of the prescribed Vicodin. Return here for concerning changes in your condition.

## 2021-07-24 NOTE — ED Triage Notes (Signed)
BIB EMS for fall on thinners. Pt did not hit head but fell and hurt right shoulder. Sling on. Pt oriented x 4.

## 2021-07-24 NOTE — ED Notes (Signed)
Ortho paged. 

## 2021-07-24 NOTE — ED Notes (Signed)
Ortho called to place splint. 

## 2021-07-24 NOTE — ED Notes (Signed)
To CT and xray

## 2021-07-24 NOTE — ED Notes (Addendum)
Patient verbalizes understanding of discharge instructions. Prescriptions and follow-up care reviewed with pt. Opportunity for questioning and answers were provided. Armband removed by staff, pt discharged from ED via wheelchair.

## 2021-07-24 NOTE — Progress Notes (Signed)
COMMUNITY PALLIATIVE CARE SW NOTE  PATIENT NAME: Cheryl Atkinson DOB: 1931/03/04 MRN: [id number]  PRIMARY CARE PROVIDER: Shelva Majestic, MD  RESPONSIBLE PARTY:  Acct ID - Guarantor Home Phone Work Phone Relationship Acct Type  [id number] TEESHA, OHM* [phone]  Self P/F     [address], [place], [place] [address]    1 PM: Palliative care SW outreached patient to complete telephonic visit.   Palliative care SW outreached patient to complete telephonic visit. Patient provided update on medical condition and/or changes. Patient shared that she has been doing pretty good since last in home visit. Patient recently saw neurology who started her on sinemet for RLS and recommended tapering back down to Neupro 3 mg, waiting to see effectiveness. Patient also saw PCP recently, no medication changes. Patient states she continues to takes hydrocodone for pain PRN and has felt much relief. Patient denies any other pains. No recent falls, she uses her RW for mobility assistance and has a caregiver for standby supervision if needed.   Patients shares that her appetite has been fair. She states that although she eats three meals a day, she is noticing a weight loss. Patient has not weighed herself recently. Patient states that she drinks supplement drinks (boost).   Patient shared she has been checking her BP's more regularly and they have been running normal 130/70's.   Psychosocial assessment completed. Patient denies financial, food insecurities or issues with transportation. Patient has denied counseling and medications in the past. Seems to have been helped with staying more engaged with friends and family. No other psychosocial needs. Patient states that she feels she has been doing well. Patient appreciative of telephonic check in.   Palliative care will continue to monitor and assist with long term care planning as needed.      SOCIAL HX:  Social History   Tobacco Use   Smoking  status: Former    Packs/day: 0.50    Years: 60.00    Pack years: 30.00    Types: Cigarettes    Quit date: 04/02/2015    Years since quitting: 6.3   Smokeless tobacco: Never  Substance Use Topics   Alcohol use: No    CODE STATUS: DNR ADVANCED DIRECTIVES: Y MOST FORM COMPLETE:  Y HOSPICE EDUCATION PROVIDED: N  PPS: Patient is A&O x3, cognitively aware. Patient requires MIN A with ADL's and able to ambulate short distances with RW.  Virtual visit: 30 min      [place], [place]

## 2021-07-24 NOTE — Progress Notes (Signed)
Orthopedic Tech Progress Note Patient Details:  Cheryl Atkinson 1931-05-23 [id number]  Ortho Devices Type of Ortho Device: Sling immobilizer Ortho Device/Splint Location: RUE Ortho Device/Splint Interventions: Ordered, Application, Adjustment   Post Interventions Patient Tolerated: Well Instructions Provided: Care of device  Donald Pore 07/24/2021, 6:48 PM

## 2021-07-27 ENCOUNTER — Telehealth: Payer: Self-pay

## 2021-07-27 DIAGNOSIS — S42009A Fracture of unspecified part of unspecified clavicle, initial encounter for closed fracture: Secondary | ICD-10-CM

## 2021-07-27 NOTE — Telephone Encounter (Signed)
Pt called in stating that she fell on Tuesday. She broke her collar bone, pinky, and she is having swelling in her legs. Pt went to the ED and has appts for her shoulder next week. Pt wants to know if Dr Durene Cal can put a referral in for a nurse to come out to her and take care of her. Can the referral be placed? Please Advice.

## 2021-07-30 NOTE — Telephone Encounter (Signed)
Ok to place referral.

## 2021-07-30 NOTE — Telephone Encounter (Signed)
Referral has been placed, please schedule pt for face to face visit or virtual within 30 days.

## 2021-07-30 NOTE — Telephone Encounter (Signed)
Pt is scheduled °

## 2021-07-30 NOTE — Telephone Encounter (Signed)
To place a referral for home health we have to have a visit within 30 days of the visit for insurance to cover it.  I am okay with you ordering home health under collarbone fracture with nursing and physical therapy.  Can use indication unable leave home safely without assistance but she will need to be scheduled at least for a virtual visit within 30 days

## 2021-07-31 ENCOUNTER — Ambulatory Visit: Payer: Medicare Other | Admitting: Cardiovascular Disease

## 2021-08-01 ENCOUNTER — Ambulatory Visit (INDEPENDENT_AMBULATORY_CARE_PROVIDER_SITE_OTHER): Payer: Medicare Other

## 2021-08-01 ENCOUNTER — Encounter: Payer: Self-pay | Admitting: Orthopaedic Surgery

## 2021-08-01 ENCOUNTER — Ambulatory Visit (INDEPENDENT_AMBULATORY_CARE_PROVIDER_SITE_OTHER): Payer: Medicare Other | Admitting: Orthopaedic Surgery

## 2021-08-01 ENCOUNTER — Other Ambulatory Visit: Payer: Self-pay

## 2021-08-01 DIAGNOSIS — S42031A Displaced fracture of lateral end of right clavicle, initial encounter for closed fracture: Secondary | ICD-10-CM | POA: Diagnosis not present

## 2021-08-01 DIAGNOSIS — I701 Atherosclerosis of renal artery: Secondary | ICD-10-CM

## 2021-08-01 DIAGNOSIS — S62646A Nondisplaced fracture of proximal phalanx of right little finger, initial encounter for closed fracture: Secondary | ICD-10-CM | POA: Diagnosis not present

## 2021-08-01 MED ORDER — HYDROCODONE-ACETAMINOPHEN 5-325 MG PO TABS: 1.0000 | ORAL_TABLET | Freq: Four times a day (QID) | ORAL | 0 refills | Status: DC | PRN

## 2021-08-01 NOTE — Progress Notes (Signed)
Office Visit Note   Patient: Cheryl Atkinson           Date of Birth: 11/23/1931           MRN: [id number] Visit Date: 08/01/2021              Requested by: Shelva Majestic, MD [address] [place],  [place] [address] PCP: Shelva Majestic, MD   Assessment & Plan: Visit Diagnoses:  1. Closed displaced fracture of acromial end of right clavicle, initial encounter   2. Closed nondisplaced fracture of proximal phalanx of right little finger, initial encounter     Plan: Impression is 1 week status post right distal clavicle fracture and right small finger proximal phalanx fracture.  Based on the patient's age and comorbidities, we will treat both of these fractures nonoperatively.  For the clavicle, she will continue to wear her sling for another 2 weeks.  For the proximal phalanx fracture, we will place her in an ulnar gutter cast.  She will be nonweightbearing to the right upper extremity.  She will continue to elevate.  She will follow-up with [place] in 2 weeks time for repeat evaluation and three-view x-rays of the right small finger as well as the right clavicle.  I have refilled her Norco.  She will call with concerns or questions in the meantime.  Follow-Up Instructions: Return in about 2 weeks (around 08/15/2021).   Orders:  Orders Placed This Encounter  Procedures   XR Clavicle Right   XR Finger Little Right   Meds ordered this encounter  Medications   HYDROcodone-acetaminophen (NORCO) 5-325 MG tablet    Sig: Take 1 tablet by mouth every 6 (six) hours as needed.    Dispense:  30 tablet    Refill:  0       Procedures: No procedures performed   Clinical Data: No additional findings.   Subjective: Chief Complaint  Patient presents with   Right Shoulder - Pain   Right Hand - Pain    HPI patient is a pleasant 87 year old female who comes in today following an injury to her right clavicle and right small finger.  On 07/24/2021, she sustained a mechanical fall  landing on her right side.  She was seen in the ED where x-rays were obtained.  X-rays demonstrated a right displaced distal clavicle fracture in addition to a right small finger proximal phalanx fracture.  She was placed in a sling.  She is here today for follow-up.  She has been having pain primarily to the clavicle.  Worse with any movement.  She denies any pain to the small finger or hand.  She has been taking Norco as needed.  Of note, she is on Eliquis for atrial fibrillation.  Review of Systems as detailed in HPI.  All others reviewed and are negative.   Objective: Vital Signs: LMP  (LMP Unknown)   Physical Exam well-developed well-nourished female no acute distress.  Alert and oriented x3.  Ortho Exam right clavicle exam shows moderate tenderness to the fracture site.  She does have mild ecchymosis.  No skin tenting.  Right small finger exam shows significant ecchymosis.  Mild tenderness to the fracture site.  She does have slight varus deformity of the small finger at the PIP joint.  She is neurovascular intact distally.  Specialty Comments:  No specialty comments available.  Imaging: XR Finger Little Right  Result Date: 08/01/2021 Mildly dorsally angulated proximal phalanx fracture  XR Clavicle Right  Result Date: 08/01/2021 Distal clavicle fracture in stable alignment    PMFS History: Patient Active Problem List   Diagnosis Date Noted   B12 deficiency 02/27/2021   Pleural effusion due to CHF (congestive heart failure) (HCC) 05/26/2020   S/P laparoscopic cholecystectomy 04/04/2020   Heart failure with preserved ejection fraction (HCC) 03/01/2020   Insomnia 03/01/2020   Bacteremia 01/28/2020   Atrial fibrillation (HCC) 01/25/2020   Abdominal aortic ectasia (HCC) 12/29/2019   Major depression 03/25/2018   Cat bite of right hand 12/21/2016   BPPV (benign paroxysmal positional vertigo) 07/12/2016   Memory loss 04/11/2016   Mallet toe of right foot 10/20/2015   Renal  artery stenosis (HCC) 09/27/2015   Hyperlipidemia 06/30/2015   Claudication (HCC) 05/04/2015   Atherosclerotic PVD with intermittent claudication (HCC) 04/26/2015   Tinnitus 12/31/2014   Former smoker 09/29/2014   DNR (do not resuscitate) 09/29/2014   Hyperglycemia 07/19/2014   Multinodular goiter 08/30/2013   Spinal stenosis of lumbar region at multiple levels 09/29/2012   HIP PAIN, BILATERAL 07/16/2010   COPD (chronic obstructive pulmonary disease) (HCC) 09/20/2009   CONSTIPATION, CHRONIC 09/20/2009   Iron deficiency anemia 11/09/2007   History of UTI 11/09/2007   RESTLESS LEG SYNDROME 09/25/2007   Essential hypertension 09/25/2007   GERD 09/25/2007   LOW BACK PAIN 09/25/2007   Osteoporosis 09/25/2007   Past Medical History:  Diagnosis Date   Acute cholecystitis 12/29/2019   Anemia    Arthritis    "shoulders" (05/04/2015)   Cellulitis of right lower extremity 11/27/2013   CHF (congestive heart failure) (HCC)    Chronic lower back pain    Constipation    COPD (chronic obstructive pulmonary disease) (HCC)    GERD (gastroesophageal reflux disease)    History of hiatal hernia    HLD (hyperlipidemia)    Hypertension    Insomnia    Osteoporosis    Peripheral arterial disease (HCC)    Restless leg syndrome    Thyroid disease     Family History  Problem Relation Age of Onset   Heart disease Mother    Heart disease Father    Heart attack Father    Cancer Sister        Breast Cancer   Atrial fibrillation Sister    Stroke Maternal Grandmother    Cancer Paternal Grandmother    Coronary artery disease Other     Past Surgical History:  Procedure Laterality Date   ABDOMINAL AORTAGRAM  05/04/2015   Procedure: Abdominal Aortagram;  Surgeon: Runell Gess, MD;  Location: [facility] INVASIVE CV LAB;  Service: Cardiovascular;;   APPENDECTOMY     CATARACT EXTRACTION W/ INTRAOCULAR LENS  IMPLANT, BILATERAL Bilateral    CHOLECYSTECTOMY N/A 04/04/2020   Procedure: LAPAROSCOPIC  CHOLECYSTECTOMY;  Surgeon: Violeta Gelinas, MD;  Location: [facility];  Service: General;  Laterality: N/A;   I & D EXTREMITY Right 12/22/2016   Procedure: IRRIGATION AND DEBRIDEMENT EXTREMITY;  Surgeon: Mack Hook, MD;  Location: [facility] OR;  Service: Orthopedics;  Laterality: Right;   IR EXCHANGE BILIARY DRAIN  03/13/2020   IR PATIENT EVAL TECH 0-60 MINS  12/30/2019   IR PERC CHOLECYSTOSTOMY  01/27/2020   IR RADIOLOGIST EVAL & MGMT  05/16/2020   PERIPHERAL VASCULAR CATHETERIZATION N/A 05/04/2015   Procedure: Lower Extremity Angiography;  Surgeon: Runell Gess, MD;  Location: [facility] INVASIVE CV LAB;  Service: Cardiovascular;  Laterality: N/A;   PERIPHERAL VASCULAR CATHETERIZATION  05/04/2015   Procedure: Peripheral Vascular Intervention;  Surgeon: Runell Gess, MD;  Location: [facility] INVASIVE CV LAB;  Service: Cardiovascular;;  RCIA - 7x22 ICAST   PERIPHERAL VASCULAR CATHETERIZATION Right 09/04/2015   Procedure: Peripheral Vascular Atherectomy;  Surgeon: Runell Gess, MD;  Location: [facility] INVASIVE CV LAB;  Service: Cardiovascular;  Laterality: Right;  SFA   sfa Right 09/04/2015   de balloon   THYROID SURGERY Right ?2013   "had goiter taken off my neck"   TONSILLECTOMY     VAGINAL HYSTERECTOMY     Social History   Occupational History   Occupation: retired  Tobacco Use   Smoking status: Former    Packs/day: 0.50    Years: 60.00    Pack years: 30.00    Types: Cigarettes    Quit date: 04/02/2015    Years since quitting: 6.3   Smokeless tobacco: Never  Vaping Use   Vaping Use: Never used  Substance and Sexual Activity   Alcohol use: No   Drug use: No   Sexual activity: Not Currently

## 2021-08-02 ENCOUNTER — Other Ambulatory Visit: Payer: Self-pay | Admitting: *Deleted

## 2021-08-02 ENCOUNTER — Telehealth: Payer: Self-pay | Admitting: *Deleted

## 2021-08-02 ENCOUNTER — Telehealth: Payer: Self-pay

## 2021-08-02 DIAGNOSIS — Z87891 Personal history of nicotine dependence: Secondary | ICD-10-CM | POA: Diagnosis not present

## 2021-08-02 DIAGNOSIS — E538 Deficiency of other specified B group vitamins: Secondary | ICD-10-CM | POA: Diagnosis not present

## 2021-08-02 DIAGNOSIS — Z7901 Long term (current) use of anticoagulants: Secondary | ICD-10-CM | POA: Diagnosis not present

## 2021-08-02 DIAGNOSIS — Z9181 History of falling: Secondary | ICD-10-CM | POA: Diagnosis not present

## 2021-08-02 DIAGNOSIS — I70213 Atherosclerosis of native arteries of extremities with intermittent claudication, bilateral legs: Secondary | ICD-10-CM | POA: Diagnosis not present

## 2021-08-02 DIAGNOSIS — M48061 Spinal stenosis, lumbar region without neurogenic claudication: Secondary | ICD-10-CM | POA: Diagnosis not present

## 2021-08-02 DIAGNOSIS — L89312 Pressure ulcer of right buttock, stage 2: Secondary | ICD-10-CM | POA: Diagnosis not present

## 2021-08-02 DIAGNOSIS — I110 Hypertensive heart disease with heart failure: Secondary | ICD-10-CM | POA: Diagnosis not present

## 2021-08-02 DIAGNOSIS — I4891 Unspecified atrial fibrillation: Secondary | ICD-10-CM | POA: Diagnosis not present

## 2021-08-02 DIAGNOSIS — G2581 Restless legs syndrome: Secondary | ICD-10-CM | POA: Diagnosis not present

## 2021-08-02 DIAGNOSIS — J449 Chronic obstructive pulmonary disease, unspecified: Secondary | ICD-10-CM | POA: Diagnosis not present

## 2021-08-02 DIAGNOSIS — F3341 Major depressive disorder, recurrent, in partial remission: Secondary | ICD-10-CM | POA: Diagnosis not present

## 2021-08-02 DIAGNOSIS — I5030 Unspecified diastolic (congestive) heart failure: Secondary | ICD-10-CM | POA: Diagnosis not present

## 2021-08-02 DIAGNOSIS — W19XXXD Unspecified fall, subsequent encounter: Secondary | ICD-10-CM | POA: Diagnosis not present

## 2021-08-02 DIAGNOSIS — M80811D Other osteoporosis with current pathological fracture, right shoulder, subsequent encounter for fracture with routine healing: Secondary | ICD-10-CM | POA: Diagnosis not present

## 2021-08-02 NOTE — Telephone Encounter (Signed)
Dr. Durene Cal was speaking to [facility] nurse and she said she forgot to mention pt has a stage 2 open area on right buttock. They would like to use Aquacell AD and foam to area. Okay for verbal order for wound care? Also has been taking Vit B12 5,000 mcg instead of 1000 mcg. Joni Reining said she told the family to stop the Vit B12 5000 and pick up 1000 mcg. Just wanted you to know.

## 2021-08-02 NOTE — Telephone Encounter (Signed)
Spoke to [facility] care nurse, verbal orders give for PT and OT evaluation for pt okay per Dr.Hunter. Joni Reining verbalized understanding. Told her in regards to medications per Dr. Durene Cal, Should separate ferrous sulfate and carbidopa by 2 hours. Lasix can cause low potassium, Avapro can increase potassium.  Potassium was started because potassium was low on last labs-I do not see an issue with this-we will correct as needed. Joni Reining verbalized understanding.  Joni Reining said she forgot to mention pt has a stage 2 open area on right buttock. They would like to use Aquacell AD and foam to area. Told her I will let Dr.Hunter know and get orders and call you back. Joni Reining verbalized understanding and also mentioned pt has been taking Vit B12 5,000 mcg instead of 1000 mcg. Joni Reining said she told the family to stop the Vit B12 5000 and pick up 1000 mcg. Just wanted Dr. Durene Cal aware. Told her I will let him know.

## 2021-08-02 NOTE — Telephone Encounter (Signed)
Should separate ferrous sulfate and carbidopa by 2 hours  Lasix can cause low potassium, Avapro can increase potassium.  Potassium was started because potassium was low on last labs-I do not see an issue with this-we will correct as needed

## 2021-08-02 NOTE — Telephone Encounter (Signed)
Home Health care nurse Joni Reining call #[phone] Requesting VO for  PT and OT evaluation  Requesting to check on medication interaction  Ferrous Sulfate interact with Carbidopa Lasix interact with Avapro and Potassium   Please advise

## 2021-08-02 NOTE — Telephone Encounter (Signed)
Received a call this afternoon from [place], she said she took Cheryl Atkinson's BP it was 115/46, and was a little disoriented this morning when they were trying to get her in the care. Sister described it as that Cheryl Atkinson forgot how to get into a car. Did have patient speak with [facility] for triage.

## 2021-08-02 NOTE — Telephone Encounter (Signed)
May provide verbal order to address the wound as requested

## 2021-08-03 NOTE — Telephone Encounter (Signed)
Triage nurse called and told us outcome was within 3-4 hours and we did not have any apt's available within the timeframe the nurse then stated she will tell them to go to [facility] or ED

## 2021-08-03 NOTE — Telephone Encounter (Signed)
FYI, see Triage message.

## 2021-08-03 NOTE — Telephone Encounter (Signed)
Triage occurred yesterday. Was never sent to me. Please let me know what question I can help with. I assume we need to check back in with patient and see how she is doing today as first step. Did they attempt to get her an appt yesterday when triage occurred?

## 2021-08-03 NOTE — Telephone Encounter (Signed)
Glad she is feeling better- unless she worsens again can check in sept 15th at visit

## 2021-08-03 NOTE — Telephone Encounter (Signed)
FYI, Called and spoke with Steward Drone to check in on pt and she states pt is doing much better today and she did not want to go be seen at urgent care or the ER yesterday but today has been a better day.

## 2021-08-03 NOTE — Telephone Encounter (Signed)
Called [place] verbal order given for Aquacell AD and foam to be used on right buttock stage 2 ulcer okay per Dr. Durene Cal. Joni Reining verbalized understanding.

## 2021-08-03 NOTE — Telephone Encounter (Signed)
Nurse Assessment Nurse: Pollyann Savoy, RN, Melissa Date/Time (Eastern Time): 08/02/2021 4:10:36 PM Confirm and document reason for call. If symptomatic, describe symptoms. ---Caller states her sisters blood pressure and it 114/46 and she is feeling disoriented. Pt states started to get confused yesterday. Pt has a sore on her bottom. Denies fever, cough or sob. Pt denies UTI sxs. Pt is on BP meds. Larey Seat last Tue over 1 week ago-fell in [place] fx collar bone and fx thumb-in cast up to elbow-denies further falls. Denies fever, cough or sob.Denies fever, cough or sob. Does the patient have any new or worsening symptoms? ---Yes Will a triage be completed? ---Yes Related visit to physician within the last 2 weeks? ---No Does the PT have any chronic conditions? (i.e. diabetes, asthma, this includes High risk factors for pregnancy, etc.) ---Yes List chronic conditions. ---HTN-Avapro 300mg  i po q am-not new and dose ont changed recently, High Chol. Is this a behavioral health or substance abuse call? ---No PLEASE NOTE: All timestamps contained within this report are represented as Standard Time. CONFIDENTIALTY NOTICE: This fax transmission is intended only for the addressee. It contains information that is legally privileged, confidential or otherwise protected from use or disclosure. If you are not the intended recipient, you are strictly prohibited from reviewing, disclosing, copying using or disseminating any of this information or taking any action in reliance on or regarding this information. If you have received this fax in error, please notify [place] immediately by telephone so that we can arrange for its return to [place]. Phone: [phone], Toll-Free: [phone], Fax: [phone] Page: 2 of 2 Call Id: [phone] Guidelines Guideline Title Affirmed Question Affirmed Notes Nurse Date/Time [id number] Time) Confusion - Delirium [1] Acting confused (e.g., disoriented, slurred speech) AND [2] brief  (now gone) [place], [place], [facility] 08/02/2021 4:17:14 PM Disp. Time 10/02/2021 Time) Disposition Final User 08/02/2021 4:26:54 PM See HCP within 4 Hours (or PCP triage) Yes Zayas, RN, 10/02/2021 Caller Disagree/Comply Disagree Caller Understands Yes PreDisposition Did not know what to do Care Advice Given Per Guideline SEE HCP (OR PCP TRIAGE) WITHIN 4 HOURS: * IF OFFICE WILL BE OPEN: You need to be seen within the next 3 or 4 hours. Call your doctor (or NP/PA) now or as soon as the office opens. CALL BACK IF: * You become worse CARE ADVICE given per Confusion-Delirium (Adult) guideline. Comments User: Efraim Kaufmann, RN Date/Time Susa Loffler Time): 08/02/2021 4:18:27 PM Confusion is intermittent-worse with pain-fx collar bone and fx finger, bilat shoulder pain. Current BP 143/67. User: 10/02/2021, RN Date/Time Susa Loffler Time): 08/02/2021 4:24:08 PM Call to back line-no answer. User: 10/02/2021, RN Date/Time Susa Loffler Time): 08/02/2021 4:29:30 PM Call to office and no appt availability-advised to be seen in next 3-4 hrs-sister declines for pt to be seen.States will call office in am. Referrals REFERRED TO PCP OFFICE GO TO FACILITY REFUSED

## 2021-08-08 ENCOUNTER — Telehealth: Payer: Self-pay

## 2021-08-08 NOTE — Telephone Encounter (Signed)
Pt's sister called stating that Cheryl Atkinson is having trouble sleeping. She stated that last night was terrible and she was fighting all night. She then stated that Dr Durene Cal already has her on a sleeping pill but it is not working. Steward Drone would like a nurse to call her to discuss what else she can do. Please Advise.

## 2021-08-08 NOTE — Telephone Encounter (Signed)
Called and spoke with Steward Drone and gave below message, she states they will follow up more at her OV next week.

## 2021-08-08 NOTE — Telephone Encounter (Signed)
Any suggestions since you will not be in office the rest of the week for an OV?

## 2021-08-08 NOTE — Telephone Encounter (Signed)
Could try low dose melatonin 1-3 mg range over the counter for a few days 30 minutes before bed with consistent bedtime and no screen time within an hour of bed

## 2021-08-09 ENCOUNTER — Telehealth: Payer: Medicare Other

## 2021-08-09 ENCOUNTER — Telehealth: Payer: Self-pay

## 2021-08-09 DIAGNOSIS — F3341 Major depressive disorder, recurrent, in partial remission: Secondary | ICD-10-CM | POA: Diagnosis not present

## 2021-08-09 DIAGNOSIS — L89312 Pressure ulcer of right buttock, stage 2: Secondary | ICD-10-CM | POA: Diagnosis not present

## 2021-08-09 DIAGNOSIS — I5030 Unspecified diastolic (congestive) heart failure: Secondary | ICD-10-CM | POA: Diagnosis not present

## 2021-08-09 DIAGNOSIS — I110 Hypertensive heart disease with heart failure: Secondary | ICD-10-CM | POA: Diagnosis not present

## 2021-08-09 DIAGNOSIS — M80811D Other osteoporosis with current pathological fracture, right shoulder, subsequent encounter for fracture with routine healing: Secondary | ICD-10-CM | POA: Diagnosis not present

## 2021-08-09 DIAGNOSIS — J449 Chronic obstructive pulmonary disease, unspecified: Secondary | ICD-10-CM | POA: Diagnosis not present

## 2021-08-09 NOTE — Telephone Encounter (Signed)
Returned the call to Tobi Bastos and VO given.

## 2021-08-09 NOTE — Telephone Encounter (Signed)
Can you advise on this in Dr. Erasmo Leventhal absense?

## 2021-08-09 NOTE — Telephone Encounter (Signed)
Nichol with [facility] called today stating patient is feeling like she had to get up and move around and that bugs are crawling over her.  Wants to know if this is due to the melatonin?    Please follow back up with patient in regard.  If additional info is needed you can reach Tobias back at [phone].

## 2021-08-09 NOTE — Telephone Encounter (Signed)
Hard to say without assessing her in person. But It would be reasonable to old the melatonin for a day or two to see if it helps.  Cheryl Atkinson. Jimmey Ralph, MD 08/09/2021 4:24 PM

## 2021-08-09 NOTE — Telephone Encounter (Signed)
Tobi Bastos from [facility] called stated that they are unable to do her physical therapy this week. She stated that they are under staffed. Tobi Bastos stated that they will start her physical therapy next week. She wants to make sure that Dr Durene Cal is fine with that. Tobi Bastos can be reached at [phone].

## 2021-08-10 NOTE — Telephone Encounter (Signed)
Lm for [employer].

## 2021-08-13 ENCOUNTER — Telehealth: Payer: Self-pay

## 2021-08-13 DIAGNOSIS — J449 Chronic obstructive pulmonary disease, unspecified: Secondary | ICD-10-CM | POA: Diagnosis not present

## 2021-08-13 DIAGNOSIS — M80811D Other osteoporosis with current pathological fracture, right shoulder, subsequent encounter for fracture with routine healing: Secondary | ICD-10-CM | POA: Diagnosis not present

## 2021-08-13 DIAGNOSIS — I5030 Unspecified diastolic (congestive) heart failure: Secondary | ICD-10-CM | POA: Diagnosis not present

## 2021-08-13 DIAGNOSIS — F3341 Major depressive disorder, recurrent, in partial remission: Secondary | ICD-10-CM | POA: Diagnosis not present

## 2021-08-13 DIAGNOSIS — L89312 Pressure ulcer of right buttock, stage 2: Secondary | ICD-10-CM | POA: Diagnosis not present

## 2021-08-13 DIAGNOSIS — I110 Hypertensive heart disease with heart failure: Secondary | ICD-10-CM | POA: Diagnosis not present

## 2021-08-13 NOTE — Telephone Encounter (Signed)
Yes thanks you may provide verbal orders

## 2021-08-13 NOTE — Telephone Encounter (Signed)
See note

## 2021-08-13 NOTE — Telephone Encounter (Signed)
.  Home Health verbal orders Caller Name:monique  Agency Name: [facility]   Callback number: [phone]  Requesting OT/PT/Skilled nursing/Social Work/Speech:PT   Reason:strengthening safe transfer gate balance training home exercises program care giver education and home safety   Frequency:2 week 5 1 week 2   Please forward to [facility] pool or providers CMA

## 2021-08-14 NOTE — Telephone Encounter (Signed)
Called and spoke with [facility] and VO given.

## 2021-08-15 ENCOUNTER — Ambulatory Visit (INDEPENDENT_AMBULATORY_CARE_PROVIDER_SITE_OTHER): Payer: Medicare Other

## 2021-08-15 ENCOUNTER — Ambulatory Visit (INDEPENDENT_AMBULATORY_CARE_PROVIDER_SITE_OTHER): Payer: Medicare Other | Admitting: Orthopaedic Surgery

## 2021-08-15 ENCOUNTER — Other Ambulatory Visit: Payer: Self-pay

## 2021-08-15 ENCOUNTER — Encounter: Payer: Self-pay | Admitting: Orthopaedic Surgery

## 2021-08-15 DIAGNOSIS — F3341 Major depressive disorder, recurrent, in partial remission: Secondary | ICD-10-CM | POA: Diagnosis not present

## 2021-08-15 DIAGNOSIS — J449 Chronic obstructive pulmonary disease, unspecified: Secondary | ICD-10-CM | POA: Diagnosis not present

## 2021-08-15 DIAGNOSIS — S42001D Fracture of unspecified part of right clavicle, subsequent encounter for fracture with routine healing: Secondary | ICD-10-CM

## 2021-08-15 DIAGNOSIS — I5030 Unspecified diastolic (congestive) heart failure: Secondary | ICD-10-CM | POA: Diagnosis not present

## 2021-08-15 DIAGNOSIS — M80811D Other osteoporosis with current pathological fracture, right shoulder, subsequent encounter for fracture with routine healing: Secondary | ICD-10-CM | POA: Diagnosis not present

## 2021-08-15 DIAGNOSIS — S62616A Displaced fracture of proximal phalanx of right little finger, initial encounter for closed fracture: Secondary | ICD-10-CM

## 2021-08-15 DIAGNOSIS — I701 Atherosclerosis of renal artery: Secondary | ICD-10-CM

## 2021-08-15 DIAGNOSIS — L89312 Pressure ulcer of right buttock, stage 2: Secondary | ICD-10-CM | POA: Diagnosis not present

## 2021-08-15 DIAGNOSIS — I110 Hypertensive heart disease with heart failure: Secondary | ICD-10-CM | POA: Diagnosis not present

## 2021-08-15 NOTE — Progress Notes (Signed)
Office Visit Note   Patient: Cheryl Atkinson           Date of Birth: 02-13-1931           MRN: [id number] Visit Date: 08/15/2021              Requested by: Shelva Majestic, MD [address] [place],  [place] [address] PCP: Shelva Majestic, MD   Assessment & Plan: Visit Diagnoses:  1. Closed displaced fracture of right clavicle with routine healing, unspecified part of clavicle, subsequent encounter   2. Closed displaced fracture of proximal phalanx of right little finger, initial encounter     Plan: Impression is 3-week status post right distal clavicle fracture and right proximal phalanx fractures to the ring and small fingers.  The patient is clinically doing well, but x-rays are showing minimal improvement in fracture healing.  At this point, we have recommended that she wear the sling in public.  In regards to the phalanx fractures, we will have also recommended that we place her back in an ulnar gutter cast nonweightbearing for another 3 weeks as her bone is fairly osteoporotic.  She notes that she has such a hard time eating with her left hand that she really thinks that this will be difficult for another 3 weeks.  We have decided to place her in an ulnar gutter Velcro splint which she will wear at all times.  She will continue nonweightbearing to the right upper extremity.  She will follow-up with [place] in 3 weeks time for repeat evaluation and x-rays of the right clavicle and right ring and small finger fractures.  Call with concerns or questions in the meantime.  Follow-Up Instructions: Return in about 3 weeks (around 09/05/2021).   Orders:  Orders Placed This Encounter  Procedures   XR Finger Little Right   XR Clavicle Right    No orders of the defined types were placed in this encounter.     Procedures: No procedures performed   Clinical Data: No additional findings.   Subjective: Chief Complaint  Patient presents with   Right Shoulder - Pain   Right Hand  - Pain     HPI patient is a pleasant 87 year old right-hand-dominant female who comes in today 3 weeks status post right distal clavicle and right small finger proximal phalanx fractures, date of injury 07/24/2021.  She has been compliant nonweightbearing to the right upper extremity.  She has been wearing an ulnar gutter cast on the right but has not been wearing her sling.  She denies any pain to either fracture.     Objective: Vital Signs: LMP  (LMP Unknown)     Ortho Exam right clavicle exam shows no tenderness at fracture site.   Specialty Comments:  No specialty comments available.  Imaging: XR Finger Little Right  Result Date: 08/15/2021 X-rays demonstrate slight bony consolidation to the proximal phalanx fracture of the small finger.  There also appears to be a fracture to the proximal phalanx of the ring finger.  XR Clavicle Right  Result Date: 08/15/2021 X-rays demonstrate stable alignment of the fracture with bony consolidation    PMFS History: Patient Active Problem List   Diagnosis Date Noted   B12 deficiency 02/27/2021   Pleural effusion due to CHF (congestive heart failure) (HCC) 05/26/2020   S/P laparoscopic cholecystectomy 04/04/2020   Heart failure with preserved ejection fraction (HCC) 03/01/2020   Insomnia 03/01/2020   Bacteremia 01/28/2020   Atrial fibrillation (HCC) 01/25/2020  Abdominal aortic ectasia (HCC) 12/29/2019   Major depression 03/25/2018   Cat bite of right hand 12/21/2016   BPPV (benign paroxysmal positional vertigo) 07/12/2016   Memory loss 04/11/2016   Mallet toe of right foot 10/20/2015   Renal artery stenosis (HCC) 09/27/2015   Hyperlipidemia 06/30/2015   Claudication (HCC) 05/04/2015   Atherosclerotic PVD with intermittent claudication (HCC) 04/26/2015   Tinnitus 12/31/2014   Former smoker 09/29/2014   DNR (do not resuscitate) 09/29/2014   Hyperglycemia 07/19/2014   Multinodular goiter 08/30/2013   Spinal stenosis of lumbar  region at multiple levels 09/29/2012   HIP PAIN, BILATERAL 07/16/2010   COPD (chronic obstructive pulmonary disease) (HCC) 09/20/2009   CONSTIPATION, CHRONIC 09/20/2009   Iron deficiency anemia 11/09/2007   History of UTI 11/09/2007   RESTLESS LEG SYNDROME 09/25/2007   Essential hypertension 09/25/2007   GERD 09/25/2007   LOW BACK PAIN 09/25/2007   Osteoporosis 09/25/2007   Past Medical History:  Diagnosis Date   Acute cholecystitis 12/29/2019   Anemia    Arthritis    "shoulders" (05/04/2015)   Cellulitis of right lower extremity 11/27/2013   CHF (congestive heart failure) (HCC)    Chronic lower back pain    Constipation    COPD (chronic obstructive pulmonary disease) (HCC)    GERD (gastroesophageal reflux disease)    History of hiatal hernia    HLD (hyperlipidemia)    Hypertension    Insomnia    Osteoporosis    Peripheral arterial disease (HCC)    Restless leg syndrome    Thyroid disease     Family History  Problem Relation Age of Onset   Heart disease Mother    Heart disease Father    Heart attack Father    Cancer Sister        Breast Cancer   Atrial fibrillation Sister    Stroke Maternal Grandmother    Cancer Paternal Grandmother    Coronary artery disease Other     Past Surgical History:  Procedure Laterality Date   ABDOMINAL AORTAGRAM  05/04/2015   Procedure: Abdominal Aortagram;  Surgeon: Runell Gess, MD;  Location: [facility] INVASIVE CV LAB;  Service: Cardiovascular;;   APPENDECTOMY     CATARACT EXTRACTION W/ INTRAOCULAR LENS  IMPLANT, BILATERAL Bilateral    CHOLECYSTECTOMY N/A 04/04/2020   Procedure: LAPAROSCOPIC CHOLECYSTECTOMY;  Surgeon: Violeta Gelinas, MD;  Location: [facility] OR;  Service: General;  Laterality: N/A;   I & D EXTREMITY Right 12/22/2016   Procedure: IRRIGATION AND DEBRIDEMENT EXTREMITY;  Surgeon: Mack Hook, MD;  Location: [facility] OR;  Service: Orthopedics;  Laterality: Right;   IR EXCHANGE BILIARY DRAIN  03/13/2020   IR PATIENT EVAL TECH 0-60 MINS   12/30/2019   IR PERC CHOLECYSTOSTOMY  01/27/2020   IR RADIOLOGIST EVAL & MGMT  05/16/2020   PERIPHERAL VASCULAR CATHETERIZATION N/A 05/04/2015   Procedure: Lower Extremity Angiography;  Surgeon: Runell Gess, MD;  Location: [facility] INVASIVE CV LAB;  Service: Cardiovascular;  Laterality: N/A;   PERIPHERAL VASCULAR CATHETERIZATION  05/04/2015   Procedure: Peripheral Vascular Intervention;  Surgeon: Runell Gess, MD;  Location: [facility] INVASIVE CV LAB;  Service: Cardiovascular;;  RCIA - 7x22 ICAST   PERIPHERAL VASCULAR CATHETERIZATION Right 09/04/2015   Procedure: Peripheral Vascular Atherectomy;  Surgeon: Runell Gess, MD;  Location: [facility] INVASIVE CV LAB;  Service: Cardiovascular;  Laterality: Right;  SFA   sfa Right 09/04/2015   de balloon   THYROID SURGERY Right ?2013   "had goiter taken off my neck"  TONSILLECTOMY     VAGINAL HYSTERECTOMY     Social History   Occupational History   Occupation: retired  Tobacco Use   Smoking status: Former    Packs/day: 0.50    Years: 60.00    Pack years: 30.00    Types: Cigarettes    Quit date: 04/02/2015    Years since quitting: 6.3   Smokeless tobacco: Never  Vaping Use   Vaping Use: Never used  Substance and Sexual Activity   Alcohol use: No   Drug use: No   Sexual activity: Not Currently

## 2021-08-16 ENCOUNTER — Ambulatory Visit (INDEPENDENT_AMBULATORY_CARE_PROVIDER_SITE_OTHER): Payer: Medicare Other | Admitting: Family Medicine

## 2021-08-16 ENCOUNTER — Encounter: Payer: Self-pay | Admitting: Family Medicine

## 2021-08-16 VITALS — BP 138/68 | HR 79 | Temp 98.6°F | Ht 66.0 in | Wt 138.0 lb

## 2021-08-16 DIAGNOSIS — E785 Hyperlipidemia, unspecified: Secondary | ICD-10-CM | POA: Diagnosis not present

## 2021-08-16 DIAGNOSIS — G2581 Restless legs syndrome: Secondary | ICD-10-CM | POA: Diagnosis not present

## 2021-08-16 DIAGNOSIS — G4700 Insomnia, unspecified: Secondary | ICD-10-CM | POA: Diagnosis not present

## 2021-08-16 DIAGNOSIS — I10 Essential (primary) hypertension: Secondary | ICD-10-CM | POA: Diagnosis not present

## 2021-08-16 DIAGNOSIS — W19XXXA Unspecified fall, initial encounter: Secondary | ICD-10-CM | POA: Diagnosis not present

## 2021-08-16 DIAGNOSIS — I4891 Unspecified atrial fibrillation: Secondary | ICD-10-CM | POA: Diagnosis not present

## 2021-08-16 DIAGNOSIS — M80811D Other osteoporosis with current pathological fracture, right shoulder, subsequent encounter for fracture with routine healing: Secondary | ICD-10-CM | POA: Diagnosis not present

## 2021-08-16 DIAGNOSIS — I5030 Unspecified diastolic (congestive) heart failure: Secondary | ICD-10-CM | POA: Diagnosis not present

## 2021-08-16 DIAGNOSIS — I701 Atherosclerosis of renal artery: Secondary | ICD-10-CM | POA: Diagnosis not present

## 2021-08-16 DIAGNOSIS — J449 Chronic obstructive pulmonary disease, unspecified: Secondary | ICD-10-CM | POA: Diagnosis not present

## 2021-08-16 DIAGNOSIS — Z23 Encounter for immunization: Secondary | ICD-10-CM

## 2021-08-16 DIAGNOSIS — L89312 Pressure ulcer of right buttock, stage 2: Secondary | ICD-10-CM | POA: Diagnosis not present

## 2021-08-16 DIAGNOSIS — F3341 Major depressive disorder, recurrent, in partial remission: Secondary | ICD-10-CM | POA: Diagnosis not present

## 2021-08-16 DIAGNOSIS — I110 Hypertensive heart disease with heart failure: Secondary | ICD-10-CM | POA: Diagnosis not present

## 2021-08-16 NOTE — Assessment & Plan Note (Signed)
#   Insomnia S:currently taking 75 mg of trazodone before bed right at bedtime. Son likes her to go to bed between 6 and 6 30 PM. Restless legs contributes-patient states this is actually primary driver for her restlessness/inability to sleep - no blackout shades as worried about fall risk  -melatonin caused intense pruritis- not a good option.  -ambien would cause high fall risk -did not tolerate a medication given in hospital not sure what that medication was.  A/P: Family requested we consider increasing trazodone-since patient's primary driver for insomnia is the restless legs-we discussed focusing on that issue first.  Could potentially consider trazodone 100 mg but I want to see how she does with the carbidopa-levodopa 

## 2021-08-16 NOTE — Assessment & Plan Note (Signed)
#   Insomnia S:currently taking 75 mg of trazodone before bed right at bedtime. Son likes her to go to bed between 6 and 6 30 PM. Restless legs contributes-patient states this is actually primary driver for her restlessness/inability to sleep - no blackout shades as worried about fall risk  -melatonin caused intense pruritis- not a good option.  -Remus Loffler would cause high fall risk -did not tolerate a medication given in hospital not sure what that medication was.  A/P: Family requested we consider increasing trazodone-since patient's primary driver for insomnia is the restless legs-we discussed focusing on that issue first.  Could potentially consider trazodone 100 mg but I want to see how she does with the carbidopa-levodopa

## 2021-08-16 NOTE — Patient Instructions (Addendum)
Health Maintenance Due  Topic Date Due   INFLUENZA VACCINE  High Dose Flu shot today. 07/02/2021   Let's try to transition bedtime to at least 7:00 pm - I would prefer closer to 8:00 pm if feasible.  Continue trazodone before bed, TRIAL sinemet (Carbidopa-Levodopa) that Dr. Allena Katz recommend at August 15th visit. You could trial a night without taking trazodone and try sinemet to see if it helps you sleep.  We may need to consider stopping Eliquis in the near future  if you have further falls  Recommended follow up: keep November visit or sooner if you need [place]

## 2021-08-16 NOTE — Progress Notes (Signed)
Phone [phone] In person visit   Subjective:   Cheryl Atkinson is a 87 y.o. year old very pleasant female patient who presents for/with See problem oriented charting Chief Complaint  Patient presents with   generalized weakness    This visit occurred during the SARS-CoV-2 public health emergency.  Safety protocols were in place, including screening questions prior to the visit, additional usage of staff PPE, and extensive cleaning of exam room while observing appropriate contact time as indicated for disinfecting solutions.   Past Medical History-  Patient Active Problem List   Diagnosis Date Noted   Pleural effusion due to CHF (congestive heart failure) (HCC) 05/26/2020    Priority: High   Heart failure with preserved ejection fraction (HCC) 03/01/2020    Priority: High   Bacteremia 01/28/2020    Priority: High   Atrial fibrillation (HCC) 01/25/2020    Priority: High   Memory loss 04/11/2016    Priority: High   Renal artery stenosis (HCC) 09/27/2015    Priority: High   Claudication (HCC) 05/04/2015    Priority: High   Atherosclerotic PVD with intermittent claudication (HCC) 04/26/2015    Priority: High   DNR (do not resuscitate) 09/29/2014    Priority: High   Hyperglycemia 07/19/2014    Priority: High   LOW BACK PAIN 09/25/2007    Priority: High   Insomnia 03/01/2020    Priority: Medium   Major depression 03/25/2018    Priority: Medium   BPPV (benign paroxysmal positional vertigo) 07/12/2016    Priority: Medium   Hyperlipidemia 06/30/2015    Priority: Medium   Former smoker 09/29/2014    Priority: Medium   COPD (chronic obstructive pulmonary disease) (HCC) 09/20/2009    Priority: Medium   Iron deficiency anemia 11/09/2007    Priority: Medium   RESTLESS LEG SYNDROME 09/25/2007    Priority: Medium   Essential hypertension 09/25/2007    Priority: Medium   GERD 09/25/2007    Priority: Medium   Osteoporosis 09/25/2007    Priority: Medium   S/P  laparoscopic cholecystectomy 04/04/2020    Priority: Low   Abdominal aortic ectasia (HCC) 12/29/2019    Priority: Low   Cat bite of right hand 12/21/2016    Priority: Low   Mallet toe of right foot 10/20/2015    Priority: Low   Tinnitus 12/31/2014    Priority: Low   Multinodular goiter 08/30/2013    Priority: Low   Spinal stenosis of lumbar region at multiple levels 09/29/2012    Priority: Low   HIP PAIN, BILATERAL 07/16/2010    Priority: Low   CONSTIPATION, CHRONIC 09/20/2009    Priority: Low   History of UTI 11/09/2007    Priority: Low   B12 deficiency 02/27/2021    Medications- reviewed and updated Current Outpatient Medications  Medication Sig Dispense Refill   Albuterol Sulfate (PROAIR RESPICLICK) 108 (90 Base) MCG/ACT AEPB Inhale 2 puffs into the lungs every 6 (six) hours as needed (shortness of breath from COPD). 1 each 5   amLODipine (NORVASC) 5 MG tablet Take 1 tablet (5 mg total) by mouth daily. 30 tablet 1   apixaban (ELIQUIS) 5 MG TABS tablet Take 1 tablet (5 mg total) by mouth 2 (two) times daily. 180 tablet 1   CALCIUM PO Take by mouth.     carbidopa-levodopa (SINEMET IR) 25-100 MG tablet Take daily as needed for worsening RLS. 90 tablet 1   Cholecalciferol (VITAMIN D3 PO) Take by mouth.     ferrous sulfate 325 (65  FE) MG tablet Take 325 mg by mouth 2 (two) times a week. Patient takes one tablet on Monday and Friday     furosemide (LASIX) 40 MG tablet Take 1 tablet (40 mg total) by mouth 2 (two) times daily. 180 tablet 3   HYDROcodone-acetaminophen (NORCO) 5-325 MG tablet Take 1 tablet by mouth every 6 (six) hours as needed. 30 tablet 0   irbesartan (AVAPRO) 300 MG tablet Take 1 tablet (300 mg total) by mouth daily. 90 tablet 3   LINZESS 145 MCG CAPS capsule Take 145 mcg by mouth every morning.     Multiple Vitamins-Minerals (PRESERVISION AREDS 2 PO) Take 1 tablet by mouth 2 (two) times daily.     polyethylene glycol (MIRALAX / GLYCOLAX) 17 g packet Take 17 g by  mouth 2 (two) times daily. Reported on 03/14/2016 (Patient taking differently: Take 17 g by mouth daily as needed for mild constipation.)     potassium chloride SA (KLOR-CON) 20 MEQ tablet Take 1 tablet (20 mEq total) by mouth 2 (two) times daily as needed. Needs to be taken  With each dose of lasix 180 tablet 3   rosuvastatin (CRESTOR) 40 MG tablet Take 1 tablet (40 mg total) by mouth daily. Take 1 tablet by mouth daily. 90 tablet 3   Rotigotine (NEUPRO) 3 MG/24HR PT24 PLACE 1 PATCH (3 MG) ONTO THE SKIN AT BEDTIME 90 patch 3   traMADol (ULTRAM) 50 MG tablet Take 1 tablet (50 mg total) by mouth every 6 (six) hours as needed for moderate pain or severe pain. 20 tablet 0   traZODone (DESYREL) 50 MG tablet Take 1.5 tablets (75 mg total) by mouth at bedtime as needed for sleep. 135 tablet 3   vitamin B-12 (CYANOCOBALAMIN) 1000 MCG tablet Take 1,000 mcg by mouth daily.     No current facility-administered medications for this visit.     Objective:  BP 138/68 Comment: last home reading  Pulse 79   Temp 98.6 F (37 C) (Temporal)   Ht 5\' 6"  (1.676 m)   Wt 138 lb (62.6 kg)   LMP  (LMP Unknown)   SpO2 98%   BMI 22.27 kg/m  Gen: NAD, resting comfortably CV: Irregular regular no murmurs rubs or gallops Lungs: CTAB no crackles, wheeze, rhonchi Ext: no edema Skin: warm, dry    Assessment and Plan   # Insomnia S:currently taking 75 mg of trazodone before bed right at bedtime. Son likes her to go to bed between 6 and 6 30 PM. Restless legs contributes-patient states this is actually primary driver for her restlessness/inability to sleep - no blackout shades as worried about fall risk  -melatonin caused intense pruritis- not a good option.  - would cause high fall risk -did not tolerate a medication given in hospital not sure what that medication was.  A/P: Family requested we consider increasing trazodone-since patient's primary driver for insomnia is the restless legs-we discussed focusing  on that issue first.  Could potentially consider trazodone 100 mg but I want to see how she does with the carbidopa-levodopa  #Restless leg syndrome S: Patient on Neupro 3 mg-prior issues with augmentation when she self titrated . Ferritin levels have been low in the past-still on iron therapy  Patient saw Dr. Remus Loffler on August 15 and was advised to take carbidopa-levodopa to help with restless legs in addition to Neupro being adjusted back down from 12 mg which she self titrated to A/P: Poor control-recommended taking the carbidopa-levodopa that neurology/Dr. August 17 recommended.  #  ED F/U for Fall S:Patient was seen in the ED on 07/24/21 and arrived as a level 2 trauma. Fall did not occur at night  EKG was ordered and showed  atrial fibrillation Borderline intraventricular conduction delay Abnormal R-wave progression.  X-ray of the Shoulder was ordered and showed acute mildly displaced distal clavicle fracture.   X-ray of the Right Elbow was also ordered and was negative.  CT of the Head/Spine WO CONTRAST was ordered and found atrophy with small vessel chronic ischemic changes of deep cerebral white matter, no acute intracranial abnormalities, osseous demineralization with degenerative disc and facet disease changes of the cervical spine, no acute cervical spine abnormalities, a fractured RIGHT clavicle at margin of exam, chronic asymmetric soft tissue thickening/fullness anterior to RIGHT C2, question mass or adenopathy approximately 1.8 x 1.0 cm. A follow-up characterization by non emergent CT imaging with IV contrast was recommended, if clinically indicated based on patient age and comorbidities.   X-ray of the Hand was ordered and showed acute fracture of the proximal phalanx right fifth finger, diffuse bone demineralization and degenerative changes, and sclerosis in the distal phalanx of the third finger, likely benign.  Later that night he had a splint application, ulnar gutter, right side  and was immobilized. Sling on the right side as well.  Diagnoses included closed displaced fracture of acromial end of right clavicle, initial encounter; closed nondisplaced fracture of proximal phalanx of right little finger, initial encounter  Patient was seen by Dr. Roda Shutters in follow-up on 08/01/2021 for right clavicle fracture and right little finger/fifth finger fracture-continued nonoperative management was discussed-was to wear sling for another 2 weeks.  Patient was also seen yesterday-patient clinically doing well but x-rays did not show significant fracture healing-was placed in ulnar gutter cast and to be nonweightbearing for another 3 weeks-due to eating difficulties was changed to a ulnar gutter Velcro splint.  Patient scheduled for follow-up on 09/05/2021 -not always taking pain medication   A/P: We discussed the importance of avoiding falls.  I am glad she is working with home health-certainly needs to improve strength.  See discussion about atrial fibrillation-may need to consider stopping Eliquis if has any further falls-could certainly consult with cardiology. - can use this visit to sign face to face forms for home health- needs to continue to work on strengthening with generalized weakness after fall  # Atrial fibrillation- new onset 2021 S: Rate controlled without medication  Anticoagulated with eliquis 5 mg BID Patient is  followed by cardiology: Dr. Allyson Sabal  A/P: Appropriately anticoagulated.  Rate controlled without medication.  Continue current medication given stability - fall even with walker this time- we discussed if has subsequent fall really need to consider stopping eliquis even though that may increase stroke risk some  #CHF with preserved EF #Hypertension S: Compliant with irbesartan 300 mg (ok per cards as only unilateral renal artery stenosis) , amlodipine 5 mg and lasix 40mg  BID -off hctz since starting lasix -off amlodipine march 2021 with edema issues -Blood  pressure high in past with caffeine intake Home reading #s:138/68  No increased edema or SOB BP Readings from Last 3 Encounters:  08/16/21 138/68  07/24/21 (!) 158/94  07/17/21 132/72  A/P: CHF appears stable.  Continue current medications.  Hypertension stable though high normal-would prefer to keep her in this range to avoid increasing fall risk.  #Hyperlipidemia/peripheral vascular disease/renal artery stenosis S: Compliant with atorvastatin 40 mg-->rosuvastatin 40mg  with LDL above 70 in early 2022 and also on eliquis (prior  plavix/asa prior to a fib). She has known atherosclerotic peripheral vascular disease with intermittent claudication-stable with claudication at approximately 200 feet.  Lab Results  Component Value Date   CHOL 169 12/04/2020   HDL 80.50 12/04/2020   LDLCALC 78 12/04/2020   LDLDIRECT 53.0 07/17/2021   TRIG 54.0 12/04/2020   CHOLHDL 2 12/04/2020  A/P: Lipids have been very well controlled for peripheral vascular disease/renal artery stenosis with LDL under 70-continue current medication  #Insulin resistance/hyperglycemia S: No current Rx but controlled without medication prior metformin 500 milligrams daily- diarrhea and low b12 Lab Results  Component Value Date   HGBA1C 6.4 07/17/2021   HGBA1C 6.2 12/04/2020   HGBA1C 6.1 (H) 05/29/2020  A/P: Thankfully well controlled at last check though trending up-check at least every 6 months  Recommended follow up: Keep November visit Future Appointments  Date Time Provider Department Center  08/21/2021  8:30 AM Sherrie George, MD [facility] None  09/05/2021  8:15 AM Tarry Kos, MD [facility] None  10/17/2021  7:50 AM Nita Sickle K, DO [facility] None  10/18/2021 10:00 AM Shelva Majestic, MD [facility]  08/08/2022  9:30 AM [facility] HEALTH COACH [facility]    Lab/Order associations:   ICD-10-CM   1. Atrial fibrillation, unspecified type (HCC)  I48.91     2. Essential hypertension  I10     3. Hyperlipidemia,  unspecified hyperlipidemia type  E78.5     4. Fall, initial encounter  W19.XXXA     5. Insomnia, unspecified type  G47.00     6. RESTLESS LEG SYNDROME  G25.81      I,Harris Phan,acting as a scribe for Tana Conch, MD.,have documented all relevant documentation on the behalf of Tana Conch, MD,as directed by  Tana Conch, MD while in the presence of Tana Conch, MD.   I, Tana Conch, MD, have reviewed all documentation for this visit. The documentation on 08/16/21 for the exam, diagnosis, procedures, and orders are all accurate and complete.   Return precautions advised.  Tana Conch, MD

## 2021-08-20 DIAGNOSIS — F3341 Major depressive disorder, recurrent, in partial remission: Secondary | ICD-10-CM | POA: Diagnosis not present

## 2021-08-20 DIAGNOSIS — I5030 Unspecified diastolic (congestive) heart failure: Secondary | ICD-10-CM | POA: Diagnosis not present

## 2021-08-20 DIAGNOSIS — I110 Hypertensive heart disease with heart failure: Secondary | ICD-10-CM | POA: Diagnosis not present

## 2021-08-20 DIAGNOSIS — M80811D Other osteoporosis with current pathological fracture, right shoulder, subsequent encounter for fracture with routine healing: Secondary | ICD-10-CM | POA: Diagnosis not present

## 2021-08-20 DIAGNOSIS — J449 Chronic obstructive pulmonary disease, unspecified: Secondary | ICD-10-CM | POA: Diagnosis not present

## 2021-08-20 DIAGNOSIS — L89312 Pressure ulcer of right buttock, stage 2: Secondary | ICD-10-CM | POA: Diagnosis not present

## 2021-08-21 ENCOUNTER — Encounter (INDEPENDENT_AMBULATORY_CARE_PROVIDER_SITE_OTHER): Payer: Medicare Other | Admitting: Ophthalmology

## 2021-08-21 ENCOUNTER — Other Ambulatory Visit: Payer: Self-pay

## 2021-08-21 DIAGNOSIS — I10 Essential (primary) hypertension: Secondary | ICD-10-CM

## 2021-08-21 DIAGNOSIS — H43813 Vitreous degeneration, bilateral: Secondary | ICD-10-CM | POA: Diagnosis not present

## 2021-08-21 DIAGNOSIS — H35033 Hypertensive retinopathy, bilateral: Secondary | ICD-10-CM | POA: Diagnosis not present

## 2021-08-21 DIAGNOSIS — H353221 Exudative age-related macular degeneration, left eye, with active choroidal neovascularization: Secondary | ICD-10-CM | POA: Diagnosis not present

## 2021-08-21 DIAGNOSIS — H353112 Nonexudative age-related macular degeneration, right eye, intermediate dry stage: Secondary | ICD-10-CM | POA: Diagnosis not present

## 2021-08-22 DIAGNOSIS — I5030 Unspecified diastolic (congestive) heart failure: Secondary | ICD-10-CM | POA: Diagnosis not present

## 2021-08-22 DIAGNOSIS — I110 Hypertensive heart disease with heart failure: Secondary | ICD-10-CM | POA: Diagnosis not present

## 2021-08-22 DIAGNOSIS — L89312 Pressure ulcer of right buttock, stage 2: Secondary | ICD-10-CM | POA: Diagnosis not present

## 2021-08-22 DIAGNOSIS — M80811D Other osteoporosis with current pathological fracture, right shoulder, subsequent encounter for fracture with routine healing: Secondary | ICD-10-CM | POA: Diagnosis not present

## 2021-08-22 DIAGNOSIS — J449 Chronic obstructive pulmonary disease, unspecified: Secondary | ICD-10-CM | POA: Diagnosis not present

## 2021-08-22 DIAGNOSIS — F3341 Major depressive disorder, recurrent, in partial remission: Secondary | ICD-10-CM | POA: Diagnosis not present

## 2021-08-23 DIAGNOSIS — J449 Chronic obstructive pulmonary disease, unspecified: Secondary | ICD-10-CM | POA: Diagnosis not present

## 2021-08-23 DIAGNOSIS — F3341 Major depressive disorder, recurrent, in partial remission: Secondary | ICD-10-CM | POA: Diagnosis not present

## 2021-08-23 DIAGNOSIS — I5030 Unspecified diastolic (congestive) heart failure: Secondary | ICD-10-CM | POA: Diagnosis not present

## 2021-08-23 DIAGNOSIS — I110 Hypertensive heart disease with heart failure: Secondary | ICD-10-CM | POA: Diagnosis not present

## 2021-08-23 DIAGNOSIS — L89312 Pressure ulcer of right buttock, stage 2: Secondary | ICD-10-CM | POA: Diagnosis not present

## 2021-08-23 DIAGNOSIS — M80811D Other osteoporosis with current pathological fracture, right shoulder, subsequent encounter for fracture with routine healing: Secondary | ICD-10-CM | POA: Diagnosis not present

## 2021-08-24 DIAGNOSIS — F3341 Major depressive disorder, recurrent, in partial remission: Secondary | ICD-10-CM | POA: Diagnosis not present

## 2021-08-24 DIAGNOSIS — J449 Chronic obstructive pulmonary disease, unspecified: Secondary | ICD-10-CM | POA: Diagnosis not present

## 2021-08-24 DIAGNOSIS — M80811D Other osteoporosis with current pathological fracture, right shoulder, subsequent encounter for fracture with routine healing: Secondary | ICD-10-CM | POA: Diagnosis not present

## 2021-08-24 DIAGNOSIS — L89312 Pressure ulcer of right buttock, stage 2: Secondary | ICD-10-CM | POA: Diagnosis not present

## 2021-08-24 DIAGNOSIS — I5030 Unspecified diastolic (congestive) heart failure: Secondary | ICD-10-CM | POA: Diagnosis not present

## 2021-08-24 DIAGNOSIS — I110 Hypertensive heart disease with heart failure: Secondary | ICD-10-CM | POA: Diagnosis not present

## 2021-08-27 ENCOUNTER — Telehealth: Payer: Self-pay

## 2021-08-27 DIAGNOSIS — F3341 Major depressive disorder, recurrent, in partial remission: Secondary | ICD-10-CM | POA: Diagnosis not present

## 2021-08-27 DIAGNOSIS — I5030 Unspecified diastolic (congestive) heart failure: Secondary | ICD-10-CM | POA: Diagnosis not present

## 2021-08-27 DIAGNOSIS — I110 Hypertensive heart disease with heart failure: Secondary | ICD-10-CM | POA: Diagnosis not present

## 2021-08-27 DIAGNOSIS — L89312 Pressure ulcer of right buttock, stage 2: Secondary | ICD-10-CM | POA: Diagnosis not present

## 2021-08-27 DIAGNOSIS — J449 Chronic obstructive pulmonary disease, unspecified: Secondary | ICD-10-CM | POA: Diagnosis not present

## 2021-08-27 DIAGNOSIS — M80811D Other osteoporosis with current pathological fracture, right shoulder, subsequent encounter for fracture with routine healing: Secondary | ICD-10-CM | POA: Diagnosis not present

## 2021-08-27 NOTE — Telephone Encounter (Signed)
Noted  

## 2021-08-27 NOTE — Telephone Encounter (Signed)
Home Health Certification or Plan of Care Tracking  Is this a Certification or Plan of Care?  [facility] Agency: Cindie Laroche  Fax Number - [phone]  Order Number:  [id number]   Where has form been placed:  [facility]

## 2021-08-29 ENCOUNTER — Emergency Department (HOSPITAL_COMMUNITY): Payer: Medicare Other

## 2021-08-29 ENCOUNTER — Encounter (HOSPITAL_COMMUNITY): Payer: Self-pay

## 2021-08-29 ENCOUNTER — Other Ambulatory Visit: Payer: Self-pay

## 2021-08-29 ENCOUNTER — Inpatient Hospital Stay (HOSPITAL_COMMUNITY)
Admission: EM | Admit: 2021-08-29 | Discharge: 2021-09-08 | DRG: 178 | Disposition: A | Discharge status: Skilled Nursing Facility | Payer: Medicare Other | Attending: Internal Medicine | Admitting: Internal Medicine

## 2021-08-29 DIAGNOSIS — I480 Paroxysmal atrial fibrillation: Secondary | ICD-10-CM | POA: Diagnosis present

## 2021-08-29 DIAGNOSIS — R509 Fever, unspecified: Secondary | ICD-10-CM | POA: Diagnosis not present

## 2021-08-29 DIAGNOSIS — R1110 Vomiting, unspecified: Secondary | ICD-10-CM | POA: Diagnosis not present

## 2021-08-29 DIAGNOSIS — E876 Hypokalemia: Secondary | ICD-10-CM | POA: Diagnosis present

## 2021-08-29 DIAGNOSIS — R269 Unspecified abnormalities of gait and mobility: Secondary | ICD-10-CM | POA: Diagnosis present

## 2021-08-29 DIAGNOSIS — Z90710 Acquired absence of both cervix and uterus: Secondary | ICD-10-CM

## 2021-08-29 DIAGNOSIS — I110 Hypertensive heart disease with heart failure: Secondary | ICD-10-CM | POA: Diagnosis not present

## 2021-08-29 DIAGNOSIS — L89312 Pressure ulcer of right buttock, stage 2: Secondary | ICD-10-CM | POA: Diagnosis not present

## 2021-08-29 DIAGNOSIS — R110 Nausea: Secondary | ICD-10-CM | POA: Diagnosis not present

## 2021-08-29 DIAGNOSIS — I739 Peripheral vascular disease, unspecified: Secondary | ICD-10-CM | POA: Diagnosis present

## 2021-08-29 DIAGNOSIS — I10 Essential (primary) hypertension: Secondary | ICD-10-CM | POA: Diagnosis present

## 2021-08-29 DIAGNOSIS — G2581 Restless legs syndrome: Secondary | ICD-10-CM | POA: Diagnosis present

## 2021-08-29 DIAGNOSIS — R5381 Other malaise: Secondary | ICD-10-CM | POA: Diagnosis present

## 2021-08-29 DIAGNOSIS — K219 Gastro-esophageal reflux disease without esophagitis: Secondary | ICD-10-CM | POA: Diagnosis present

## 2021-08-29 DIAGNOSIS — J449 Chronic obstructive pulmonary disease, unspecified: Secondary | ICD-10-CM | POA: Diagnosis present

## 2021-08-29 DIAGNOSIS — R109 Unspecified abdominal pain: Secondary | ICD-10-CM | POA: Diagnosis not present

## 2021-08-29 DIAGNOSIS — D509 Iron deficiency anemia, unspecified: Secondary | ICD-10-CM | POA: Diagnosis present

## 2021-08-29 DIAGNOSIS — I482 Chronic atrial fibrillation, unspecified: Secondary | ICD-10-CM | POA: Diagnosis not present

## 2021-08-29 DIAGNOSIS — I5032 Chronic diastolic (congestive) heart failure: Secondary | ICD-10-CM | POA: Diagnosis not present

## 2021-08-29 DIAGNOSIS — G8929 Other chronic pain: Secondary | ICD-10-CM | POA: Diagnosis present

## 2021-08-29 DIAGNOSIS — E785 Hyperlipidemia, unspecified: Secondary | ICD-10-CM | POA: Diagnosis present

## 2021-08-29 DIAGNOSIS — D61818 Other pancytopenia: Secondary | ICD-10-CM | POA: Diagnosis present

## 2021-08-29 DIAGNOSIS — R001 Bradycardia, unspecified: Secondary | ICD-10-CM | POA: Diagnosis not present

## 2021-08-29 DIAGNOSIS — R519 Headache, unspecified: Secondary | ICD-10-CM | POA: Diagnosis not present

## 2021-08-29 DIAGNOSIS — I517 Cardiomegaly: Secondary | ICD-10-CM | POA: Diagnosis not present

## 2021-08-29 DIAGNOSIS — R531 Weakness: Secondary | ICD-10-CM

## 2021-08-29 DIAGNOSIS — M80811D Other osteoporosis with current pathological fracture, right shoulder, subsequent encounter for fracture with routine healing: Secondary | ICD-10-CM | POA: Diagnosis not present

## 2021-08-29 DIAGNOSIS — Z87891 Personal history of nicotine dependence: Secondary | ICD-10-CM

## 2021-08-29 DIAGNOSIS — Z66 Do not resuscitate: Secondary | ICD-10-CM | POA: Diagnosis present

## 2021-08-29 DIAGNOSIS — I5030 Unspecified diastolic (congestive) heart failure: Secondary | ICD-10-CM | POA: Diagnosis present

## 2021-08-29 DIAGNOSIS — M19012 Primary osteoarthritis, left shoulder: Secondary | ICD-10-CM | POA: Diagnosis present

## 2021-08-29 DIAGNOSIS — Z79899 Other long term (current) drug therapy: Secondary | ICD-10-CM

## 2021-08-29 DIAGNOSIS — Z885 Allergy status to narcotic agent status: Secondary | ICD-10-CM

## 2021-08-29 DIAGNOSIS — Z8249 Family history of ischemic heart disease and other diseases of the circulatory system: Secondary | ICD-10-CM

## 2021-08-29 DIAGNOSIS — I2510 Atherosclerotic heart disease of native coronary artery without angina pectoris: Secondary | ICD-10-CM | POA: Diagnosis present

## 2021-08-29 DIAGNOSIS — R112 Nausea with vomiting, unspecified: Secondary | ICD-10-CM | POA: Diagnosis not present

## 2021-08-29 DIAGNOSIS — I4891 Unspecified atrial fibrillation: Secondary | ICD-10-CM | POA: Diagnosis present

## 2021-08-29 DIAGNOSIS — F3341 Major depressive disorder, recurrent, in partial remission: Secondary | ICD-10-CM | POA: Diagnosis not present

## 2021-08-29 DIAGNOSIS — Z7901 Long term (current) use of anticoagulants: Secondary | ICD-10-CM

## 2021-08-29 DIAGNOSIS — N289 Disorder of kidney and ureter, unspecified: Secondary | ICD-10-CM

## 2021-08-29 DIAGNOSIS — U071 COVID-19: Secondary | ICD-10-CM | POA: Diagnosis not present

## 2021-08-29 DIAGNOSIS — R0602 Shortness of breath: Secondary | ICD-10-CM | POA: Diagnosis not present

## 2021-08-29 DIAGNOSIS — R0789 Other chest pain: Secondary | ICD-10-CM | POA: Diagnosis not present

## 2021-08-29 DIAGNOSIS — R079 Chest pain, unspecified: Secondary | ICD-10-CM | POA: Diagnosis not present

## 2021-08-29 DIAGNOSIS — M810 Age-related osteoporosis without current pathological fracture: Secondary | ICD-10-CM | POA: Diagnosis present

## 2021-08-29 DIAGNOSIS — L89620 Pressure ulcer of left heel, unstageable: Secondary | ICD-10-CM | POA: Diagnosis not present

## 2021-08-29 DIAGNOSIS — T501X5A Adverse effect of loop [high-ceiling] diuretics, initial encounter: Secondary | ICD-10-CM | POA: Diagnosis present

## 2021-08-29 DIAGNOSIS — M19011 Primary osteoarthritis, right shoulder: Secondary | ICD-10-CM | POA: Diagnosis present

## 2021-08-29 LAB — MAGNESIUM: Magnesium: 1.6 mg/dL — ABNORMAL LOW (ref 1.7–2.4)

## 2021-08-29 LAB — COMPREHENSIVE METABOLIC PANEL
ALT: 12 U/L (ref 0–44)
AST: 19 U/L (ref 15–41)
Albumin: 3.2 g/dL — ABNORMAL LOW (ref 3.5–5.0)
Alkaline Phosphatase: 112 U/L (ref 38–126)
Anion gap: 9 (ref 5–15)
BUN: 13 mg/dL (ref 8–23)
CO2: 27 mmol/L (ref 22–32)
Calcium: 8.8 mg/dL — ABNORMAL LOW (ref 8.9–10.3)
Chloride: 101 mmol/L (ref 98–111)
Creatinine, Ser: 0.88 mg/dL (ref 0.44–1.00)
GFR, Estimated: >60 mL/min (ref >60)
Glucose, Bld: 126 mg/dL — ABNORMAL HIGH (ref 70–99)
Potassium: 2.8 mmol/L — ABNORMAL LOW (ref 3.5–5.1)
Sodium: 137 mmol/L (ref 135–145)
Total Bilirubin: 0.6 mg/dL (ref 0.3–1.2)
Total Protein: 7 g/dL (ref 6.5–8.1)

## 2021-08-29 LAB — URINALYSIS, ROUTINE W REFLEX MICROSCOPIC
Bacteria, UA: NONE SEEN
Bilirubin Urine: NEGATIVE
Glucose, UA: NEGATIVE mg/dL
Hgb urine dipstick: NEGATIVE
Ketones, ur: NEGATIVE mg/dL
Leukocytes,Ua: NEGATIVE
Nitrite: NEGATIVE
Protein, ur: 30 mg/dL — AB
Specific Gravity, Urine: 1.01 (ref 1.005–1.030)
pH: 6 (ref 5.0–8.0)

## 2021-08-29 LAB — CBC WITH DIFFERENTIAL/PLATELET
Abs Immature Granulocytes: 0.02 10*3/uL (ref 0.00–0.07)
Basophils Absolute: 0 10*3/uL (ref 0.0–0.1)
Basophils Relative: 1 %
Eosinophils Absolute: 0 10*3/uL (ref 0.0–0.5)
Eosinophils Relative: 1 %
HCT: 32.1 % — ABNORMAL LOW (ref 36.0–46.0)
Hemoglobin: 10.1 g/dL — ABNORMAL LOW (ref 12.0–15.0)
Immature Granulocytes: 0 %
Lymphocytes Relative: 4 %
Lymphs Abs: 0.2 10*3/uL — ABNORMAL LOW (ref 0.7–4.0)
MCH: 28.6 pg (ref 26.0–34.0)
MCHC: 31.5 g/dL (ref 30.0–36.0)
MCV: 90.9 fL (ref 80.0–100.0)
Monocytes Absolute: 0.7 10*3/uL (ref 0.1–1.0)
Monocytes Relative: 11 %
Neutro Abs: 5 10*3/uL (ref 1.7–7.7)
Neutrophils Relative %: 83 %
Platelets: 181 10*3/uL (ref 150–400)
RBC: 3.53 MIL/uL — ABNORMAL LOW (ref 3.87–5.11)
RDW: 16 % — ABNORMAL HIGH (ref 11.5–15.5)
WBC: 6 10*3/uL (ref 4.0–10.5)
nRBC: 0 % (ref 0.0–0.2)

## 2021-08-29 LAB — RESP PANEL BY RT-PCR (FLU A&B, COVID) ARPGX2
Influenza A by PCR: NEGATIVE
Influenza B by PCR: NEGATIVE
SARS Coronavirus 2 by RT PCR: POSITIVE — AB

## 2021-08-29 LAB — FERRITIN: Ferritin: 124 ng/mL (ref 11–307)

## 2021-08-29 LAB — LACTIC ACID, PLASMA
Lactic Acid, Venous: 2.1 mmol/L (ref 0.5–1.9)
Lactic Acid, Venous: 1.2 mmol/L (ref 0.5–1.9)

## 2021-08-29 LAB — D-DIMER, QUANTITATIVE: D-Dimer, Quant: 1.55 ug/mL-FEU — ABNORMAL HIGH (ref 0.00–0.50)

## 2021-08-29 LAB — C-REACTIVE PROTEIN: CRP: 0.6 mg/dL (ref <1.0)

## 2021-08-29 LAB — PROTIME-INR
INR: 1.4 — ABNORMAL HIGH (ref 0.8–1.2)
Prothrombin Time: 17 seconds — ABNORMAL HIGH (ref 11.4–15.2)

## 2021-08-29 LAB — AMMONIA: Ammonia: 11 umol/L (ref 9–35)

## 2021-08-29 LAB — APTT: aPTT: 30 seconds (ref 24–36)

## 2021-08-29 LAB — FIBRINOGEN: Fibrinogen: 414 mg/dL (ref 210–475)

## 2021-08-29 MED ORDER — POLYETHYLENE GLYCOL 3350 17 G PO PACK
17.0000 g | PACK | Freq: Every day | ORAL | Status: DC | PRN
  Administered 2021-09-06: 17 g via ORAL
  Filled 2021-08-29: qty 1

## 2021-08-29 MED ORDER — ACETAMINOPHEN 325 MG PO TABS
325.0000 mg | ORAL_TABLET | Freq: Once | ORAL | Status: AC
  Administered 2021-08-29: 325 mg via ORAL
  Filled 2021-08-29: qty 1

## 2021-08-29 MED ORDER — LACTATED RINGERS IV SOLN: INTRAVENOUS | Status: AC

## 2021-08-29 MED ORDER — APIXABAN 5 MG PO TABS
5.0000 mg | ORAL_TABLET | Freq: Two times a day (BID) | ORAL | Status: DC
  Administered 2021-08-29 – 2021-08-31 (×4): 5 mg via ORAL
  Filled 2021-08-29 (×4): qty 1

## 2021-08-29 MED ORDER — POTASSIUM CHLORIDE 10 MEQ/100ML IV SOLN
10.0000 meq | INTRAVENOUS | Status: AC
  Administered 2021-08-29 (×2): 10 meq via INTRAVENOUS
  Filled 2021-08-29 (×2): qty 100

## 2021-08-29 MED ORDER — SODIUM CHLORIDE 0.9 % IV SOLN
500.0000 mg | Freq: Once | INTRAVENOUS | Status: AC
  Administered 2021-08-29: 500 mg via INTRAVENOUS
  Filled 2021-08-29: qty 500

## 2021-08-29 MED ORDER — ASCORBIC ACID 500 MG PO TABS
500.0000 mg | ORAL_TABLET | Freq: Every day | ORAL | Status: DC
  Administered 2021-08-29 – 2021-09-08 (×11): 500 mg via ORAL
  Filled 2021-08-29 (×11): qty 1

## 2021-08-29 MED ORDER — FERROUS SULFATE 325 (65 FE) MG PO TABS
325.0000 mg | ORAL_TABLET | ORAL | Status: DC
  Administered 2021-08-30 – 2021-09-06 (×3): 325 mg via ORAL
  Filled 2021-08-29 (×4): qty 1

## 2021-08-29 MED ORDER — MELATONIN 3 MG PO TABS: 3.0000 mg | ORAL_TABLET | Freq: Every day | ORAL | Status: DC

## 2021-08-29 MED ORDER — LORAZEPAM 2 MG/ML IJ SOLN
0.5000 mg | Freq: Once | INTRAMUSCULAR | Status: AC
  Administered 2021-08-29: 0.5 mg via INTRAVENOUS
  Filled 2021-08-29: qty 1

## 2021-08-29 MED ORDER — POTASSIUM CHLORIDE CRYS ER 20 MEQ PO TBCR
20.0000 meq | EXTENDED_RELEASE_TABLET | Freq: Once | ORAL | Status: AC
  Administered 2021-08-29: 20 meq via ORAL
  Filled 2021-08-29: qty 1

## 2021-08-29 MED ORDER — ALBUTEROL SULFATE HFA 108 (90 BASE) MCG/ACT IN AERS
2.0000 | INHALATION_SPRAY | Freq: Four times a day (QID) | RESPIRATORY_TRACT | Status: DC | PRN
  Filled 2021-08-29: qty 6.7

## 2021-08-29 MED ORDER — IRBESARTAN 300 MG PO TABS
300.0000 mg | ORAL_TABLET | Freq: Every day | ORAL | Status: DC
  Administered 2021-08-30 – 2021-09-08 (×10): 300 mg via ORAL
  Filled 2021-08-29 (×11): qty 1

## 2021-08-29 MED ORDER — MOLNUPIRAVIR EUA 200MG CAPSULE
4.0000 | ORAL_CAPSULE | Freq: Two times a day (BID) | ORAL | Status: AC
  Administered 2021-08-30 – 2021-09-03 (×9): 800 mg via ORAL
  Filled 2021-08-29 (×3): qty 4

## 2021-08-29 MED ORDER — ROSUVASTATIN CALCIUM 20 MG PO TABS
40.0000 mg | ORAL_TABLET | Freq: Every day | ORAL | Status: DC
  Administered 2021-08-29 – 2021-09-08 (×11): 40 mg via ORAL
  Filled 2021-08-29 (×11): qty 2

## 2021-08-29 MED ORDER — IOHEXOL 350 MG/ML SOLN
80.0000 mL | Freq: Once | INTRAVENOUS | Status: AC | PRN
  Administered 2021-08-29: 80 mL via INTRAVENOUS

## 2021-08-29 MED ORDER — FUROSEMIDE 20 MG PO TABS
40.0000 mg | ORAL_TABLET | Freq: Two times a day (BID) | ORAL | Status: DC
  Administered 2021-08-30: 40 mg via ORAL
  Filled 2021-08-29: qty 2

## 2021-08-29 MED ORDER — LACTATED RINGERS IV SOLN
INTRAVENOUS | Status: DC
Start: 2021-08-29 — End: 2021-08-29

## 2021-08-29 MED ORDER — LACTATED RINGERS IV SOLN: INTRAVENOUS | Status: DC

## 2021-08-29 MED ORDER — ZINC SULFATE 220 (50 ZN) MG PO CAPS
220.0000 mg | ORAL_CAPSULE | Freq: Every day | ORAL | Status: DC
  Administered 2021-08-29 – 2021-09-08 (×11): 220 mg via ORAL
  Filled 2021-08-29 (×11): qty 1

## 2021-08-29 MED ORDER — SODIUM CHLORIDE 0.9 % IV BOLUS
1000.0000 mL | Freq: Once | INTRAVENOUS | Status: AC
  Administered 2021-08-29: 1000 mL via INTRAVENOUS

## 2021-08-29 MED ORDER — TRAZODONE HCL 50 MG PO TABS: 75.0000 mg | ORAL_TABLET | Freq: Every evening | ORAL | Status: DC | PRN

## 2021-08-29 MED ORDER — ACETAMINOPHEN 325 MG PO TABS
650.0000 mg | ORAL_TABLET | Freq: Four times a day (QID) | ORAL | Status: DC | PRN
  Administered 2021-08-30 – 2021-09-07 (×4): 650 mg via ORAL
  Filled 2021-08-29 (×5): qty 2

## 2021-08-29 MED ORDER — ONDANSETRON HCL 4 MG/2ML IJ SOLN: 4.0000 mg | Freq: Four times a day (QID) | INTRAMUSCULAR | Status: DC | PRN

## 2021-08-29 MED ORDER — ONDANSETRON HCL 4 MG PO TABS: 4.0000 mg | ORAL_TABLET | Freq: Four times a day (QID) | ORAL | Status: DC | PRN

## 2021-08-29 MED ORDER — SODIUM CHLORIDE 0.9 % IV SOLN
2.0000 g | Freq: Once | INTRAVENOUS | Status: AC
  Administered 2021-08-29: 2 g via INTRAVENOUS
  Filled 2021-08-29: qty 20

## 2021-08-29 NOTE — ED Notes (Signed)
Roanna Raider sister [phone] please call with any updates

## 2021-08-29 NOTE — H&P (Addendum)
History and Physical    Cheryl Atkinson BTD:[id number] DOB: [birth year] DOA: 08/29/2021  PCP: Shelva Majestic, MD Consultants:  cardiology: Dr. Allyson Sabal Patient coming from:  Home - lives with son and has aide   Chief Complaint: generalized weakness   HPI: Cheryl Atkinson is a 87 y.o. female with medical history significant of hypertension, hyperlipidemia, heart failure with preserved ejection fraction, GERD, restless leg syndrome, iron deficiency anemia, atrial fibrillation, COPD who presented to ED with complaints of weakness and lethargy and just not feeling well. She states she started to feel weak about 2 days ago. She denies any falls. She states she has had good PO intake. She states last night she had fever, but doesn't really know the readings.  She states they brought her into ED because she couldn't stand up. Denies any confusion. She has had some headaches that started 2 days ago as well. She denies any coughing or shortness of breath. No stomach pain, N/V/D. No dysuria, frequency or urgency. No swelling in her legs or rashes. She denies any sick contacts.   Vaccinated x 2 with pfizer.   ED Course: vitals: Temperature 102, blood pressure 173/79, heart rate 103, respiratory rate 18, oxygen 98% on room air. Pertinent labs: Hemoglobin 10.1 (10.5-11.4), potassium 2.8, INR 1.4, lactic acid 2.1--->1.2,  COVID positive.  With no active disease, cardiomegaly.  CT head with no acute intracranial pathology.  CTA shows no signs of PE.  Small layering bilateral pleural effusions.  Moderate to marked cardiac enlargement and three-vessel coronary artery disease. CT abdomen pelvis: Small left renal lesion unchanged since 2021.  Follow-up renal sonogram is suggested.  Distention of the cecum with mid abdominal position without signs of twist of the bowel.  Worsening of abdominal pain could consider repeat imaging is warranted.  Ununited right clavicular fracture and subacute bilateral rib fractures.  In  the ER given 1 L bolus, 2 g Rocephin and 500 mg of Zithromax, as well as 20 mEq of potassium.  TRH asked to admit.  Review of Systems: As per HPI; otherwise review of systems reviewed and negative.   Ambulatory Status:  Ambulates with walker and cane     Past Medical History:  Diagnosis Date   Acute cholecystitis 12/29/2019   Anemia    Arthritis    "shoulders" (05/04/2015)   Cellulitis of right lower extremity 11/27/2013   CHF (congestive heart failure) (HCC)    Chronic lower back pain    Constipation    COPD (chronic obstructive pulmonary disease) (HCC)    GERD (gastroesophageal reflux disease)    History of hiatal hernia    HLD (hyperlipidemia)    Hypertension    Insomnia    Osteoporosis    Peripheral arterial disease (HCC)    Restless leg syndrome    Thyroid disease     Past Surgical History:  Procedure Laterality Date   ABDOMINAL AORTAGRAM  05/04/2015   Procedure: Abdominal Aortagram;  Surgeon: Runell Gess, MD;  Location: [facility] INVASIVE CV LAB;  Service: Cardiovascular;;   APPENDECTOMY     CATARACT EXTRACTION W/ INTRAOCULAR LENS  IMPLANT, BILATERAL Bilateral    CHOLECYSTECTOMY N/A 04/04/2020   Procedure: LAPAROSCOPIC CHOLECYSTECTOMY;  Surgeon: Violeta Gelinas, MD;  Location: [facility] OR;  Service: General;  Laterality: N/A;   I & D EXTREMITY Right 12/22/2016   Procedure: IRRIGATION AND DEBRIDEMENT EXTREMITY;  Surgeon: Mack Hook, MD;  Location: [facility] OR;  Service: Orthopedics;  Laterality: Right;   IR EXCHANGE BILIARY DRAIN  03/13/2020  IR PATIENT EVAL TECH 0-60 MINS  12/30/2019   IR PERC CHOLECYSTOSTOMY  01/27/2020   IR RADIOLOGIST EVAL & MGMT  05/16/2020   PERIPHERAL VASCULAR CATHETERIZATION N/A 05/04/2015   Procedure: Lower Extremity Angiography;  Surgeon: Runell Gess, MD;  Location: [facility] INVASIVE CV LAB;  Service: Cardiovascular;  Laterality: N/A;   PERIPHERAL VASCULAR CATHETERIZATION  05/04/2015   Procedure: Peripheral Vascular Intervention;  Surgeon: Runell Gess, MD;   Location: [facility] INVASIVE CV LAB;  Service: Cardiovascular;;  RCIA - 7x22 ICAST   PERIPHERAL VASCULAR CATHETERIZATION Right 09/04/2015   Procedure: Peripheral Vascular Atherectomy;  Surgeon: Runell Gess, MD;  Location: [facility] CV LAB;  Service: Cardiovascular;  Laterality: Right;  SFA   sfa Right 09/04/2015   de balloon   THYROID SURGERY Right ?2013   "had goiter taken off my neck"   TONSILLECTOMY     VAGINAL HYSTERECTOMY      Social History   Socioeconomic History   Marital status: Widowed    Spouse name: Not on file   Number of children: Not on file   Years of education: Not on file   Highest education level: Not on file  Occupational History   Occupation: retired  Tobacco Use   Smoking status: Former    Packs/day: 0.50    Years: 60.00    Pack years: 30.00    Types: Cigarettes    Quit date: 04/02/2015    Years since quitting: 6.4   Smokeless tobacco: Never  Vaping Use   Vaping Use: Never used  Substance and Sexual Activity   Alcohol use: No   Drug use: No   Sexual activity: Not Currently  Other Topics Concern   Not on file  Social History Narrative   ** Merged History Encounter ** Widowed 2013. 2 sons. 1 grandchild.    Son can help on weekends. Sister and sister in law could help as well. Thinks she may stop driving in next few years.       Retired from [employer].       Hobbies: time at home and with family      HCPOA: sister-in-law and brother. Deirdre Evener.       DNR/DNI      Regular exercise: none   Caffeine use: cup of coffee    Lives in a one story home    Right Handed    Social Determinants of Health   Financial Resource Strain: Low Risk    Difficulty of Paying Living Expenses: Not hard at all  Food Insecurity: No Food Insecurity   Worried About Programme researcher, broadcasting/film/video in the Last Year: Never true   Ran Out of Food in the Last Year: Never true  Transportation Needs: No Transportation Needs   Lack of Transportation (Medical): No   Lack of  Transportation (Non-Medical): No  Physical Activity: Inactive   Days of Exercise per Week: 0 days   Minutes of Exercise per Session: 0 min  Stress: No Stress Concern Present   Feeling of Stress : Not at all  Social Connections: Moderately Isolated   Frequency of Communication with Friends and Family: More than three times a week   Frequency of Social Gatherings with Friends and Family: Not on file   Attends Religious Services: 1 to 4 times per year   Active Member of [employer] or Organizations: No   Attends Banker Meetings: Never   Marital Status: Widowed  Intimate Partner Violence: Not At Risk   Fear  of Current or Ex-Partner: No   Emotionally Abused: No   Physically Abused: No   Sexually Abused: No    Allergies  Allergen Reactions   Codeine     Hallucinations     Codeine     hallucinations   Codeine Other (See Comments)    Syncope     Family History  Problem Relation Age of Onset   Heart disease Mother    Heart disease Father    Heart attack Father    Cancer Sister        Breast Cancer   Atrial fibrillation Sister    Stroke Maternal Grandmother    Cancer Paternal Grandmother    Coronary artery disease Other     Prior to Admission medications   Medication Sig Start Date End Date Taking? Authorizing Provider  Albuterol Sulfate (PROAIR RESPICLICK) 108 (90 Base) MCG/ACT AEPB Inhale 2 puffs into the lungs every 6 (six) hours as needed (shortness of breath from COPD). 12/04/20   Shelva Majestic, MD  amLODipine (NORVASC) 5 MG tablet Take 1 tablet (5 mg total) by mouth daily. 06/21/21   Eulis Foster, FNP  apixaban (ELIQUIS) 5 MG TABS tablet Take 1 tablet (5 mg total) by mouth 2 (two) times daily. 06/01/21   Shelva Majestic, MD  CALCIUM PO Take by mouth.    [provider]  carbidopa-levodopa (SINEMET IR) 25-100 MG tablet Take daily as needed for worsening RLS. 07/16/21   Nita Sickle K, DO  Cholecalciferol (VITAMIN D3 PO) Take by mouth.    [provider]  ferrous sulfate 325 (65 FE) MG tablet Take 325 mg by mouth 2 (two) times a week. Patient takes one tablet on Monday and Friday    [provider]  furosemide (LASIX) 40 MG tablet Take 1 tablet (40 mg total) by mouth 2 (two) times daily. 12/04/20   Shelva Majestic, MD  HYDROcodone-acetaminophen (NORCO) 5-325 MG tablet Take 1 tablet by mouth every 6 (six) hours as needed. 08/01/21   Cristie Hem, PA-C  irbesartan (AVAPRO) 300 MG tablet Take 1 tablet (300 mg total) by mouth daily. 12/04/20   Shelva Majestic, MD  LINZESS 145 MCG CAPS capsule Take 145 mcg by mouth every morning. 02/22/21   [provider]  Multiple Vitamins-Minerals (PRESERVISION AREDS 2 PO) Take 1 tablet by mouth 2 (two) times daily.    [provider]  polyethylene glycol (MIRALAX / GLYCOLAX) 17 g packet Take 17 g by mouth 2 (two) times daily. Reported on 03/14/2016 Patient taking differently: Take 17 g by mouth daily as needed for mild constipation. 01/03/20   Hongalgi, Maximino Greenland, MD  potassium chloride SA (KLOR-CON) 20 MEQ tablet Take 1 tablet (20 mEq total) by mouth 2 (two) times daily as needed. Needs to be taken  With each dose of lasix 12/08/20   Shelva Majestic, MD  rosuvastatin (CRESTOR) 40 MG tablet Take 1 tablet (40 mg total) by mouth daily. Take 1 tablet by mouth daily. 12/05/20   Shelva Majestic, MD  Rotigotine (NEUPRO) 3 MG/24HR PT24 PLACE 1 PATCH (3 MG) ONTO THE SKIN AT BEDTIME 12/04/20   Shelva Majestic, MD  traMADol (ULTRAM) 50 MG tablet Take 1 tablet (50 mg total) by mouth every 6 (six) hours as needed for moderate pain or severe pain. 07/17/21   Shelva Majestic, MD  traZODone (DESYREL) 50 MG tablet Take 1.5 tablets (75 mg total) by mouth at bedtime as needed for sleep.  12/04/20   Shelva Majestic, MD  vitamin B-12 (CYANOCOBALAMIN) 1000 MCG tablet Take 1,000 mcg by mouth daily.    [provider]    Physical Exam: Vitals:   08/29/21 1530 08/29/21 1645 08/29/21 1700  08/29/21 1730  BP: (!) 152/72 (!) 151/82 (!) 136/91 (!) 144/95  Pulse: 89 80 77 78  Resp: (!) 22 (!) 21 (!) 23 20  Temp:      TempSrc:      SpO2: 93% 97% 97% 99%  Weight:      Height:         General:  Appears calm and comfortable and is in NAD Eyes:  PERRL, EOMI, normal lids, iris ENT:  grossly normal hearing, lips & tongue, mmm; dentures  Neck:  no LAD, masses or thyromegaly; no carotid bruits Cardiovascular:  irregularly, irregular, no m/r/g. No LE edema.  Respiratory:   CTA bilaterally with no wheezes/rales/rhonchi.  Normal respiratory effort. Abdomen:  soft, NT, ND, NABS Back:   normal alignment, no CVAT Skin:  no rash or induration seen on limited exam Musculoskeletal:  BUE: 3-4/5. BLE: 2/5. decreased ROM, no bony abnormality Lower extremity:  No LE edema.  Limited foot exam with no ulcerations.  2+ distal pulses. Psychiatric:  grossly normal mood and affect, speech fluent and appropriate, AOx3 Neurologic:  CN 2-12 grossly intact, moves all extremities in coordinated fashion, sensation intact    Radiological Exams on Admission: Independently reviewed - see discussion in A/P where applicable  CT HEAD WO CONTRAST ( )  Result Date: 08/29/2021 CLINICAL DATA:  Headaches EXAM: CT HEAD WITHOUT CONTRAST TECHNIQUE: Contiguous axial images were obtained from the base of the skull through the vertex without intravenous contrast. COMPARISON:  None. FINDINGS: Brain: No evidence of acute infarction, hemorrhage, extra-axial collection, ventriculomegaly, or mass effect. Generalized cerebral atrophy. Periventricular white matter low attenuation likely secondary to microangiopathy. Vascular: Cerebrovascular atherosclerotic calcifications are noted. Skull: Negative for fracture or focal lesion. Sinuses/Orbits: Visualized portions of the orbits are unremarkable. Visualized portions of the paranasal sinuses are unremarkable. Visualized portions of the mastoid air cells are unremarkable. Other:  None. IMPRESSION: No acute intracranial pathology. Electronically Signed   By: Elige Ko M.D.   On: 08/29/2021 17:01   CT Angio Chest PE W and/or Wo Contrast  Result Date: 08/29/2021 CLINICAL DATA:  Weakness, headache with nausea vomiting and shortness of breath in an 87 year old female. EXAM: CT ANGIOGRAPHY CHEST CT ABDOMEN AND PELVIS WITH CONTRAST TECHNIQUE: Multidetector CT imaging of the chest was performed using the standard protocol during bolus administration of intravenous contrast. Multiplanar CT image reconstructions and MIPs were obtained to evaluate the vascular anatomy. Multidetector CT imaging of the abdomen and pelvis was performed using the standard protocol during bolus administration of intravenous contrast. CONTRAST:  80mL OMNIPAQUE IOHEXOL 350 MG/ML SOLN COMPARISON:  Clavicular x-rays of August 15, 2021. Abdomen pelvis CT from May 16, 2020. FINDINGS: CTA CHEST FINDINGS Cardiovascular: Calcified aortic atherosclerosis without aneurysmal dilation of the thoracic aorta. Calcification and soft plaque extending into branch vessels in the chest. Limited assessment due to bolus timing. Cardiac size with moderate to marked cardiac enlargement. Three-vessel coronary artery disease. No substantial pericardial effusion. Central pulmonary arteries with density of 620 Hounsfield units. Mildly limited assessment at the lung bases. No signs of pulmonary embolism to the segmental level at the lung bases or elsewhere in the chest. Mediastinum/Nodes: No signs of adenopathy in the chest. Esophagus grossly normal by CT. Lungs/Pleura: No pneumothorax, mild basilar atelectasis. Small layering bilateral  effusions. Airways are patent. Musculoskeletal: Ununited RIGHT clavicular fracture shows a similar appearance to imaging from August 15, 2021. Chondroid lesion in the RIGHT proximal humerus. Osteopenia. Subacute rib fractures along the LEFT chest involving the third, fourth, fifth anterior ribs. Displaced  subacute fractures of LEFT-sided ribs involving LEFT second, third and fourth ribs as well as the fifth rib without displacement. Spinal degenerative changes about the thoracic spine. Healed fractures of the sternum Review of the MIP images confirms the above findings. CT ABDOMEN and PELVIS FINDINGS Hepatobiliary: Liver with smooth contours. No focal, suspicious hepatic lesion. Post cholecystectomy with patent portal vein. Biliary duct dilation, common bile duct approximately 9 mm. No substantial intrahepatic biliary duct distension. No perihepatic stranding or fluid. Pancreas: Signs of pancreatic atrophy. No visible inflammation or lesion. Atrophy more pronounced in the head of the pancreas. No ductal dilation. Spleen: Normal. Adrenals/Urinary Tract: Small renal lesion unchanged since 2021 arises from the lateral LEFT kidney measuring approximately 11 mm and 60 Hounsfield units. Numerous bladder diverticuli.  No perivesical stranding. No hydronephrosis. No suspicious renal lesion on the RIGHT. Stomach/Bowel: No acute gastrointestinal process. Colonic diverticulosis. Appendix not visualized. No pericecal inflammation. Cecum displays a position that oriented more towards the upper abdomen. Maximal caliber approximately 7 cm. No pericecal stranding. No twist in the colon. Mild distortion of the mesentery without frank twisting. Approximally 180 degree rotation of distal small bowel mesentery. Vascular/Lymphatic: Aortic atherosclerosis. No sign of aneurysm. Smooth contour of the IVC. There is no gastrohepatic or hepatoduodenal ligament lymphadenopathy. No retroperitoneal or mesenteric lymphadenopathy. No pelvic sidewall lymphadenopathy. .2.5 cm maximal caliber of the infrarenal abdominal aorta is unchanged since previous imaging. Reproductive: Post hysterectomy, no adnexal mass. Other: No free air.  No sign of ascites. Musculoskeletal: Osteopenia. Spinal degenerative changes. Interval near complete loss of height of  the L2 vertebral body since the prior CT study from May 16, 2020, surrounding degenerative changes implant chronicity, in fact this was shown to be fractured on January 27, 2021 but shows further loss of height since that time previously approximately 50% loss of height now approximately 80% loss of height. Bony retropulsion at this level has increased since previous imaging, proximally 6-7 mm bony retropulsion at the level of the L2 fracture. Interval worsening loss of height is age indeterminate and there is moderate central canal narrowing associated with this finding. Review of the MIP images confirms the above findings. IMPRESSION: No signs of pulmonary embolism limited at the subsegmental level in the lung bases. Small layering bilateral effusions. Moderate to marked cardiac enlargement. Three-vessel coronary artery disease. Worsening loss of height of the L2 vertebral level with substantial surrounding degenerative change. Now with approximately 80% loss of height and moderate canal narrowing due to retropulsion of posterior cortical elements. Correlate with any acute pain in this area with further imaging as warranted. Small LEFT renal lesion unchanged since studies from 2021, potentially hemorrhagic or proteinaceous cyst, small indolent renal neoplasm is possible, f/u renal sonogram is suggested when the patient is able. Mild distension of the cecum with mid abdominal position without signs of twist of the bowel. No frank mesenteric twist. This could be due to a mobile cecum which can have an association with cecal volvulus, no current evidence. If there is worsening of abdominal pain could consider repeat imaging as warranted. Numerous bladder diverticuli. Ununited RIGHT clavicular fracture and subacute bilateral rib fractures. Electronically Signed   By: Donzetta Kohut M.D.   On: 08/29/2021 17:34   CT ABDOMEN PELVIS W  CONTRAST  Result Date: 08/29/2021 CLINICAL DATA:  Weakness, headache with nausea  vomiting and shortness of breath in an 87 year old female. EXAM: CT ANGIOGRAPHY CHEST CT ABDOMEN AND PELVIS WITH CONTRAST TECHNIQUE: Multidetector CT imaging of the chest was performed using the standard protocol during bolus administration of intravenous contrast. Multiplanar CT image reconstructions and MIPs were obtained to evaluate the vascular anatomy. Multidetector CT imaging of the abdomen and pelvis was performed using the standard protocol during bolus administration of intravenous contrast. CONTRAST:  46mL OMNIPAQUE IOHEXOL 350 MG/ML SOLN COMPARISON:  Clavicular x-rays of August 15, 2021. Abdomen pelvis CT from May 16, 2020. FINDINGS: CTA CHEST FINDINGS Cardiovascular: Calcified aortic atherosclerosis without aneurysmal dilation of the thoracic aorta. Calcification and soft plaque extending into branch vessels in the chest. Limited assessment due to bolus timing. Cardiac size with moderate to marked cardiac enlargement. Three-vessel coronary artery disease. No substantial pericardial effusion. Central pulmonary arteries with density of 620 Hounsfield units. Mildly limited assessment at the lung bases. No signs of pulmonary embolism to the segmental level at the lung bases or elsewhere in the chest. Mediastinum/Nodes: No signs of adenopathy in the chest. Esophagus grossly normal by CT. Lungs/Pleura: No pneumothorax, mild basilar atelectasis. Small layering bilateral effusions. Airways are patent. Musculoskeletal: Ununited RIGHT clavicular fracture shows a similar appearance to imaging from August 15, 2021. Chondroid lesion in the RIGHT proximal humerus. Osteopenia. Subacute rib fractures along the LEFT chest involving the third, fourth, fifth anterior ribs. Displaced subacute fractures of LEFT-sided ribs involving LEFT second, third and fourth ribs as well as the fifth rib without displacement. Spinal degenerative changes about the thoracic spine. Healed fractures of the sternum Review of the MIP  images confirms the above findings. CT ABDOMEN and PELVIS FINDINGS Hepatobiliary: Liver with smooth contours. No focal, suspicious hepatic lesion. Post cholecystectomy with patent portal vein. Biliary duct dilation, common bile duct approximately 9 mm. No substantial intrahepatic biliary duct distension. No perihepatic stranding or fluid. Pancreas: Signs of pancreatic atrophy. No visible inflammation or lesion. Atrophy more pronounced in the head of the pancreas. No ductal dilation. Spleen: Normal. Adrenals/Urinary Tract: Small renal lesion unchanged since 2021 arises from the lateral LEFT kidney measuring approximately 11 mm and 60 Hounsfield units. Numerous bladder diverticuli.  No perivesical stranding. No hydronephrosis. No suspicious renal lesion on the RIGHT. Stomach/Bowel: No acute gastrointestinal process. Colonic diverticulosis. Appendix not visualized. No pericecal inflammation. Cecum displays a position that oriented more towards the upper abdomen. Maximal caliber approximately 7 cm. No pericecal stranding. No twist in the colon. Mild distortion of the mesentery without frank twisting. Approximally 180 degree rotation of distal small bowel mesentery. Vascular/Lymphatic: Aortic atherosclerosis. No sign of aneurysm. Smooth contour of the IVC. There is no gastrohepatic or hepatoduodenal ligament lymphadenopathy. No retroperitoneal or mesenteric lymphadenopathy. No pelvic sidewall lymphadenopathy. .2.5 cm maximal caliber of the infrarenal abdominal aorta is unchanged since previous imaging. Reproductive: Post hysterectomy, no adnexal mass. Other: No free air.  No sign of ascites. Musculoskeletal: Osteopenia. Spinal degenerative changes. Interval near complete loss of height of the L2 vertebral body since the prior CT study from May 16, 2020, surrounding degenerative changes implant chronicity, in fact this was shown to be fractured on January 27, 2021 but shows further loss of height since that time  previously approximately 50% loss of height now approximately 80% loss of height. Bony retropulsion at this level has increased since previous imaging, proximally 6-7 mm bony retropulsion at the level of the L2 fracture. Interval worsening loss of  height is age indeterminate and there is moderate central canal narrowing associated with this finding. Review of the MIP images confirms the above findings. IMPRESSION: No signs of pulmonary embolism limited at the subsegmental level in the lung bases. Small layering bilateral effusions. Moderate to marked cardiac enlargement. Three-vessel coronary artery disease. Worsening loss of height of the L2 vertebral level with substantial surrounding degenerative change. Now with approximately 80% loss of height and moderate canal narrowing due to retropulsion of posterior cortical elements. Correlate with any acute pain in this area with further imaging as warranted. Small LEFT renal lesion unchanged since studies from 2021, potentially hemorrhagic or proteinaceous cyst, small indolent renal neoplasm is possible, f/u renal sonogram is suggested when the patient is able. Mild distension of the cecum with mid abdominal position without signs of twist of the bowel. No frank mesenteric twist. This could be due to a mobile cecum which can have an association with cecal volvulus, no current evidence. If there is worsening of abdominal pain could consider repeat imaging as warranted. Numerous bladder diverticuli. Ununited RIGHT clavicular fracture and subacute bilateral rib fractures. Electronically Signed   By: Donzetta Kohut M.D.   On: 08/29/2021 17:34   DG Chest Port 1 View  Result Date: 08/29/2021 CLINICAL DATA:  Questionable sepsis EXAM: PORTABLE CHEST 1 VIEW COMPARISON:  01/14/2021 FINDINGS: Cardiomegaly. No confluent opacities, effusions or edema. No acute bony abnormality. IMPRESSION: Cardiomegaly.  No active disease. Electronically Signed   By: Charlett Nose M.D.   On:  08/29/2021 14:33    EKG: Independently reviewed.  Atrial fibrillation with rate 93; nonspecific ST changes with no evidence of acute ischemia   Labs on Admission: I have personally reviewed the available labs and imaging studies at the time of the admission.  Pertinent labs:  Hemoglobin 10.1 (10.5-11.4),  potassium 2.8,  INR 1.4,  lactic acid 2.1--->1.2,  COVID positive.   Assessment/Plan Principal Problem:   COVID-19 virus infection -87 year old female with complaints of generalized weakness, lethargy x 2 days found to have COVID-19. -airborne and contact precautions -Place in observation with gentle IVF overnight, check COVID labs and start antiviral medication. Paxlovid contraindicated with current medication. Discussed with pharmacy. Will start on molnpiravir x 5 days. Patient has no respiratory symptoms and hospitalized for weakness and is at high risk of progression to severe illness. Discussed that this is a FDA EAU drug used to treat covid and patient agreed and would like to take this.  -Chest x-ray with no acute lung findings and not requiring any oxygen. -nutraceutical vitamin bundle. Hold melatonin, adverse reaction in past.  -SABA inhaler prn/IS at bedside  -tylenol prn headache   Active Problems:   Hypokalemia -Repleted with 20 mEq in ER via IV -will give 20 more milliequivalents p.o. and check a magnesium -Continue daily potassium replacement. Verify MAR.  -could be contributing to overall weakness   Ambulatory dysfunction PT/OT eval  Worsening loss of height of the L2 vertebral level with substantial surrounding degenerative change. Now with approximately 80% loss of height and moderate canal narrowing due to retropulsion of posterior cortical elements. Correlate with any acute pain in this area with further imaging as warranted-she denies any back pain, but will f/u with PT recs.     Essential hypertension -Was hypertensive on arrival to ER but blood  pressure readings have been closer to goal -Continue home Avapro and continue to monitor. Has not had these today. Unsure if on norvasc, per pcp notes. Discontinued due to edema in  past.   Distention of cecum on CT -patient with no abdominal pain, N/V/D. Had one episode of nausea at home about 2-3 days ago per son -monitor clinically and repeat imaging as needed.     COPD (chronic obstructive pulmonary disease) (HCC) -No signs of exacerbation even in setting of COVID-19 -Continue albuterol inhaler as needed    Atrial fibrillation (HCC) -Telemetry Rate controlled.  On no AV nodal blocking agent.  Continue Eliquis    Heart failure with preserved ejection fraction (HCC) -Appears euvolemic on exam. Dry weight in 130s.  -Echo 05/21/2020 showed an EF of 60 to 65% with normal left ventricular function.  Diastolic parameters indeterminate in setting of atrial fibrillation. -Continue Lasix and ARB -Monitor intake and output and daily weights    Iron deficiency anemia -Close to baseline continue to monitor -Continue oral iron    RESTLESS LEG SYNDROME -Continue home medication.  Takes Sinemet IR as needed for worsening RLS as well as Neupro daily    Hyperlipidemia Continue Crestor daily   Left renal lesion Recommend f/u with renal ultrasound    Body mass index is 22.28 kg/m.   Level of care: Telemetry Medical DVT prophylaxis: eliquis  Code Status:  DNR - confirmed with patient Family Communication: None present; I spoke with the patient's son by telephone at the time of admission. liah morr: [phone] Disposition Plan:  The patient is from: home  Anticipated d/c is to: home. Dispo per day team.   Requires inpatient hospitalization and is at significant risk of worsening, requires constant monitoring, assessment and MDM with specialists.    Patient is currently: acutely ill Consults called: none  Admission status:  observation   Dragon dictation used in completing this  note.   Orland Mustard MD [facility]   How to contact the [facility] Attending or Consulting provider 7A - 7P or covering provider during after hours 7P -7A, for this patient?  Check the care team in [facility] and look for a) attending/consulting TRH provider listed and b) the [facility] team listed Log into [email or web address] and use [place]'s universal password to access. If you do not have the password, please contact the hospital operator. Locate the [facility] provider you are looking for under [facility] and page to a number that you can be directly reached. If you still have difficulty reaching the provider, please page the [facility] (Director on Call) for the Hospitalists listed on amion for assistance.   08/29/2021, 6:12 PM

## 2021-08-29 NOTE — ED Triage Notes (Addendum)
Pt arrived via [facility] from home for increased generalized weakness that started today. HH nurse and family tried several times to get pt up from wheelchair to toilet and was unsuccessful due to the weakness. Pt is A&Ox3.

## 2021-08-29 NOTE — ED Provider Notes (Signed)
Assumed care of patient at 3:30 PM.  Patient sent here for generalized weakness.  Found to have a fever and mild tachycardia.  Sepsis order set initiated.  She is has some generalized weakness.  Body aches, headache.  Was too weak to get up to use the toilet.  Not sure if she had fallen.  Sepsis work-up thus far is unremarkable with no pneumonia on chest x-ray.  Urinalysis negative for infection.  No leukocytosis but mild lactic acidosis.  CT of chest and abdomen and pelvis have been ordered to further evaluate for infectious process.  Viral testing is pending.  I was able to talk with family and they deny any recent falls.  Patient was feeling okay yesterday and basically started to complain of weakness and body aches today.  Has not had COVID booster.  COVID test is positive.  CT scan of chest, abdomen, pelvis is overall unremarkable.  She is not having any active back pain.  She is not having any active abdominal pain.  She did have some earlier today but none currently.  Abdomen is soft.  She has known L2 compression fracture with worsening height loss.  But not tenderness air currently.  There was some mild distention of the cecum but otherwise she is nontender and no obvious signs of a mesenteric twist.  Overall will admit for hydration.  She states that she would likely be interested in antiviral medicine but wants to think about it.  This chart was dictated using voice recognition software.  Despite best efforts to proofread,  errors can occur which can change the documentation meaning.    Virgina Norfolk, DO 08/29/21 [phone]

## 2021-08-29 NOTE — ED Notes (Signed)
Patient transported to CT 

## 2021-08-30 ENCOUNTER — Other Ambulatory Visit: Payer: Self-pay

## 2021-08-30 ENCOUNTER — Encounter (HOSPITAL_COMMUNITY): Payer: Self-pay | Admitting: Internal Medicine

## 2021-08-30 DIAGNOSIS — R001 Bradycardia, unspecified: Secondary | ICD-10-CM | POA: Diagnosis not present

## 2021-08-30 DIAGNOSIS — U071 COVID-19: Secondary | ICD-10-CM | POA: Diagnosis present

## 2021-08-30 DIAGNOSIS — K6389 Other specified diseases of intestine: Secondary | ICD-10-CM | POA: Diagnosis not present

## 2021-08-30 DIAGNOSIS — T501X5A Adverse effect of loop [high-ceiling] diuretics, initial encounter: Secondary | ICD-10-CM | POA: Diagnosis present

## 2021-08-30 DIAGNOSIS — K5900 Constipation, unspecified: Secondary | ICD-10-CM | POA: Diagnosis not present

## 2021-08-30 DIAGNOSIS — L89626 Pressure-induced deep tissue damage of left heel: Secondary | ICD-10-CM | POA: Diagnosis not present

## 2021-08-30 DIAGNOSIS — R5381 Other malaise: Secondary | ICD-10-CM | POA: Diagnosis present

## 2021-08-30 DIAGNOSIS — L89620 Pressure ulcer of left heel, unstageable: Secondary | ICD-10-CM | POA: Diagnosis present

## 2021-08-30 DIAGNOSIS — G8929 Other chronic pain: Secondary | ICD-10-CM | POA: Diagnosis present

## 2021-08-30 DIAGNOSIS — G20 Parkinson's disease: Secondary | ICD-10-CM | POA: Diagnosis not present

## 2021-08-30 DIAGNOSIS — I480 Paroxysmal atrial fibrillation: Secondary | ICD-10-CM | POA: Diagnosis present

## 2021-08-30 DIAGNOSIS — R609 Edema, unspecified: Secondary | ICD-10-CM | POA: Diagnosis not present

## 2021-08-30 DIAGNOSIS — I482 Chronic atrial fibrillation, unspecified: Secondary | ICD-10-CM | POA: Diagnosis present

## 2021-08-30 DIAGNOSIS — S62606D Fracture of unspecified phalanx of right little finger, subsequent encounter for fracture with routine healing: Secondary | ICD-10-CM | POA: Diagnosis not present

## 2021-08-30 DIAGNOSIS — J449 Chronic obstructive pulmonary disease, unspecified: Secondary | ICD-10-CM | POA: Diagnosis present

## 2021-08-30 DIAGNOSIS — I10 Essential (primary) hypertension: Secondary | ICD-10-CM

## 2021-08-30 DIAGNOSIS — M810 Age-related osteoporosis without current pathological fracture: Secondary | ICD-10-CM | POA: Diagnosis present

## 2021-08-30 DIAGNOSIS — I499 Cardiac arrhythmia, unspecified: Secondary | ICD-10-CM | POA: Diagnosis not present

## 2021-08-30 DIAGNOSIS — E876 Hypokalemia: Secondary | ICD-10-CM | POA: Diagnosis present

## 2021-08-30 DIAGNOSIS — Z8249 Family history of ischemic heart disease and other diseases of the circulatory system: Secondary | ICD-10-CM | POA: Diagnosis not present

## 2021-08-30 DIAGNOSIS — I5030 Unspecified diastolic (congestive) heart failure: Secondary | ICD-10-CM | POA: Diagnosis not present

## 2021-08-30 DIAGNOSIS — Z7401 Bed confinement status: Secondary | ICD-10-CM | POA: Diagnosis not present

## 2021-08-30 DIAGNOSIS — Z90710 Acquired absence of both cervix and uterus: Secondary | ICD-10-CM | POA: Diagnosis not present

## 2021-08-30 DIAGNOSIS — Z7901 Long term (current) use of anticoagulants: Secondary | ICD-10-CM | POA: Diagnosis not present

## 2021-08-30 DIAGNOSIS — D61818 Other pancytopenia: Secondary | ICD-10-CM | POA: Diagnosis present

## 2021-08-30 DIAGNOSIS — I4891 Unspecified atrial fibrillation: Secondary | ICD-10-CM

## 2021-08-30 DIAGNOSIS — I5032 Chronic diastolic (congestive) heart failure: Secondary | ICD-10-CM | POA: Diagnosis present

## 2021-08-30 DIAGNOSIS — D509 Iron deficiency anemia, unspecified: Secondary | ICD-10-CM | POA: Diagnosis present

## 2021-08-30 DIAGNOSIS — F29 Unspecified psychosis not due to a substance or known physiological condition: Secondary | ICD-10-CM | POA: Diagnosis not present

## 2021-08-30 DIAGNOSIS — Z66 Do not resuscitate: Secondary | ICD-10-CM | POA: Diagnosis present

## 2021-08-30 DIAGNOSIS — G4700 Insomnia, unspecified: Secondary | ICD-10-CM | POA: Diagnosis not present

## 2021-08-30 DIAGNOSIS — K219 Gastro-esophageal reflux disease without esophagitis: Secondary | ICD-10-CM | POA: Diagnosis present

## 2021-08-30 DIAGNOSIS — I2510 Atherosclerotic heart disease of native coronary artery without angina pectoris: Secondary | ICD-10-CM | POA: Diagnosis present

## 2021-08-30 DIAGNOSIS — E785 Hyperlipidemia, unspecified: Secondary | ICD-10-CM | POA: Diagnosis present

## 2021-08-30 DIAGNOSIS — I110 Hypertensive heart disease with heart failure: Secondary | ICD-10-CM | POA: Diagnosis present

## 2021-08-30 DIAGNOSIS — N289 Disorder of kidney and ureter, unspecified: Secondary | ICD-10-CM | POA: Diagnosis present

## 2021-08-30 DIAGNOSIS — R1084 Generalized abdominal pain: Secondary | ICD-10-CM | POA: Diagnosis not present

## 2021-08-30 DIAGNOSIS — M48061 Spinal stenosis, lumbar region without neurogenic claudication: Secondary | ICD-10-CM | POA: Diagnosis not present

## 2021-08-30 DIAGNOSIS — G2581 Restless legs syndrome: Secondary | ICD-10-CM | POA: Diagnosis present

## 2021-08-30 DIAGNOSIS — R531 Weakness: Secondary | ICD-10-CM | POA: Diagnosis not present

## 2021-08-30 DIAGNOSIS — R0602 Shortness of breath: Secondary | ICD-10-CM | POA: Diagnosis not present

## 2021-08-30 DIAGNOSIS — I739 Peripheral vascular disease, unspecified: Secondary | ICD-10-CM | POA: Diagnosis present

## 2021-08-30 LAB — CBC WITH DIFFERENTIAL/PLATELET
Abs Immature Granulocytes: 0.01 10*3/uL (ref 0.00–0.07)
Basophils Absolute: 0 10*3/uL (ref 0.0–0.1)
Basophils Relative: 1 %
Eosinophils Absolute: 0 10*3/uL (ref 0.0–0.5)
Eosinophils Relative: 0 %
HCT: 28.3 % — ABNORMAL LOW (ref 36.0–46.0)
Hemoglobin: 8.8 g/dL — ABNORMAL LOW (ref 12.0–15.0)
Immature Granulocytes: 0 %
Lymphocytes Relative: 12 %
Lymphs Abs: 0.4 10*3/uL — ABNORMAL LOW (ref 0.7–4.0)
MCH: 28.7 pg (ref 26.0–34.0)
MCHC: 31.1 g/dL (ref 30.0–36.0)
MCV: 92.2 fL (ref 80.0–100.0)
Monocytes Absolute: 0.6 10*3/uL (ref 0.1–1.0)
Monocytes Relative: 20 %
Neutro Abs: 2.1 10*3/uL (ref 1.7–7.7)
Neutrophils Relative %: 67 %
Platelets: 134 10*3/uL — ABNORMAL LOW (ref 150–400)
RBC: 3.07 MIL/uL — ABNORMAL LOW (ref 3.87–5.11)
RDW: 16.1 % — ABNORMAL HIGH (ref 11.5–15.5)
WBC: 3.1 10*3/uL — ABNORMAL LOW (ref 4.0–10.5)
nRBC: 0 % (ref 0.0–0.2)

## 2021-08-30 LAB — COMPREHENSIVE METABOLIC PANEL
ALT: 13 U/L (ref 0–44)
AST: 17 U/L (ref 15–41)
Albumin: 2.8 g/dL — ABNORMAL LOW (ref 3.5–5.0)
Alkaline Phosphatase: 96 U/L (ref 38–126)
Anion gap: 10 (ref 5–15)
BUN: 9 mg/dL (ref 8–23)
CO2: 23 mmol/L (ref 22–32)
Calcium: 8 mg/dL — ABNORMAL LOW (ref 8.9–10.3)
Chloride: 104 mmol/L (ref 98–111)
Creatinine, Ser: 0.7 mg/dL (ref 0.44–1.00)
GFR, Estimated: >60 mL/min (ref >60)
Glucose, Bld: 91 mg/dL (ref 70–99)
Potassium: 3.2 mmol/L — ABNORMAL LOW (ref 3.5–5.1)
Sodium: 137 mmol/L (ref 135–145)
Total Bilirubin: 0.8 mg/dL (ref 0.3–1.2)
Total Protein: 5.7 g/dL — ABNORMAL LOW (ref 6.5–8.1)

## 2021-08-30 LAB — URINE CULTURE

## 2021-08-30 LAB — FERRITIN: Ferritin: 117 ng/mL (ref 11–307)

## 2021-08-30 LAB — PHOSPHORUS: Phosphorus: 2.6 mg/dL (ref 2.5–4.6)

## 2021-08-30 LAB — C-REACTIVE PROTEIN: CRP: 0.8 mg/dL (ref <1.0)

## 2021-08-30 LAB — D-DIMER, QUANTITATIVE: D-Dimer, Quant: 1.52 ug/mL-FEU — ABNORMAL HIGH (ref 0.00–0.50)

## 2021-08-30 LAB — MAGNESIUM: Magnesium: 1.5 mg/dL — ABNORMAL LOW (ref 1.7–2.4)

## 2021-08-30 MED ORDER — CARBIDOPA-LEVODOPA 25-100 MG PO TABS
1.0000 | ORAL_TABLET | Freq: Every day | ORAL | Status: DC | PRN
  Administered 2021-08-31 – 2021-09-01 (×2): 1 via ORAL
  Filled 2021-08-30 (×2): qty 1

## 2021-08-30 MED ORDER — FUROSEMIDE 40 MG PO TABS
40.0000 mg | ORAL_TABLET | Freq: Every day | ORAL | Status: DC
  Administered 2021-08-31 – 2021-09-07 (×8): 40 mg via ORAL
  Filled 2021-08-30 (×8): qty 1

## 2021-08-30 MED ORDER — ROTIGOTINE 3 MG/24HR TD PT24
3.0000 mg | MEDICATED_PATCH | Freq: Every day | TRANSDERMAL | Status: DC
  Administered 2021-09-01 – 2021-09-07 (×7): 3 mg via TRANSDERMAL
  Filled 2021-08-30 (×7): qty 1

## 2021-08-30 MED ORDER — POTASSIUM CHLORIDE CRYS ER 20 MEQ PO TBCR
40.0000 meq | EXTENDED_RELEASE_TABLET | Freq: Four times a day (QID) | ORAL | Status: AC
Start: 2021-08-30 — End: 2021-08-30
  Administered 2021-08-30 (×2): 40 meq via ORAL
  Filled 2021-08-30 (×2): qty 2

## 2021-08-30 MED ORDER — MAGNESIUM SULFATE 4 GM/100ML IV SOLN
4.0000 g | Freq: Once | INTRAVENOUS | Status: AC
  Administered 2021-08-30: 4 g via INTRAVENOUS
  Filled 2021-08-30: qty 100

## 2021-08-30 MED ORDER — HYDRALAZINE HCL 20 MG/ML IJ SOLN
10.0000 mg | Freq: Once | INTRAMUSCULAR | Status: AC
  Administered 2021-08-30: 10 mg via INTRAVENOUS
  Filled 2021-08-30: qty 1

## 2021-08-30 NOTE — TOC Initial Note (Addendum)
Transition of Care [facility]) - Initial/Assessment Note    Patient Details  Name: Cheryl Atkinson MRN: [id number] Date of Birth: July 06, 1931  Transition [facility]) CM/SW Contact:    Lockie Pares, RN Phone Number: 08/30/2021, 4:41 PM  Clinical Narrative:                  87 year old presented to the ED from home for weakness, not eating. She could not getup from her wheelchair, and family could not assist her. Found to have COVID. No PE however has a  right clavicular fx as well as left  rib fractures.    Fell at the beginning of September was seen in the office for follow up.  Called son for discharge planning , greg, phone listed not in service ( cell)  left confidential voice mail at home number.  CM will follow for needs, recommendations, and transitions, PT and OT consulted.   1700 Son called back, patient lives with son and has all necessary DME , He has a ramp for the wheelchair and  Home health but does not remember the agency name. He states that she is very opposed to "rehab" but if she needs it and it is recommended she will have to go so she can get stronger to get home. SNF vs home with home health PT and OT evaluation pending.  CM will follow closely with CSW  for needs recommendations, and transitions of care.  1715 Angela from [facility] confirmed that she is active with them for RN, PT and OT   Expected Discharge Plan: Home w Home Health Services Barriers to Discharge: Continued Medical Work up   Patient Goals and CMS Choice    SNF vs Home with Home health.    Expected Discharge Plan and Services Expected Discharge Plan: Home w Home Health Services   Discharge Planning Services: CM Consult   Living arrangements for the past 2 months: Single Family Home                                      Prior Living Arrangements/Services Living arrangements for the past 2 months: Single Family Home Lives with:: Adult Children Patient language and need for  interpreter reviewed:: Yes        Need for Family Participation in Patient Care: Yes (Comment) Care giver support system in place?: Yes (comment) Current home services: DME, Home PT, Home OT Criminal Activity/Legal Involvement Pertinent to Current Situation/Hospitalization: No - Comment as needed  Activities of Daily Living Home Assistive Devices/Equipment: Wheelchair ADL Screening (condition at time of admission) Patient's cognitive ability adequate to safely complete daily activities?: Yes Is the patient deaf or have difficulty hearing?: No Does the patient have difficulty seeing, even when wearing glasses/contacts?: No Does the patient have difficulty concentrating, remembering, or making decisions?: No Patient able to express need for assistance with ADLs?: Yes Does the patient have difficulty dressing or bathing?: Yes Independently performs ADLs?: No Communication: Independent Dressing (OT): Needs assistance Is this a change from baseline?: Pre-admission baseline Grooming: Needs assistance Is this a change from baseline?: Pre-admission baseline Bathing: Independent Toileting: Needs assistance Is this a change from baseline?: Pre-admission baseline In/Out Bed: Dependent Is this a change from baseline?: Change from baseline, expected to last <3 days Walks in Home: Dependent Is this a change from baseline?: Pre-admission baseline Does the patient have difficulty walking or climbing stairs?:  Yes Weakness of Legs: Both Weakness of Arms/Hands: Right  Permission Sought/Granted                  Emotional Assessment       Orientation: : Oriented to Place, Oriented to Self Alcohol / Substance Use: Not Applicable Psych Involvement: No (comment)  Admission diagnosis:  Hypokalemia [E87.6] Weakness [R53.1] Fever, unspecified fever cause [R50.9] COVID-19 [U07.1] Patient Active Problem List   Diagnosis Date Noted   COVID-19 08/30/2021   COVID-19 virus infection 08/29/2021    Renal lesion 08/29/2021   B12 deficiency 02/27/2021   Pleural effusion due to CHF (congestive heart failure) (HCC) 05/26/2020   S/P laparoscopic cholecystectomy 04/04/2020   Heart failure with preserved ejection fraction (HCC) 03/01/2020   Insomnia 03/01/2020   Bacteremia 01/28/2020   Atrial fibrillation (HCC) 01/25/2020   Abdominal aortic ectasia (HCC) 12/29/2019   Major depression 03/25/2018   Hypokalemia 12/21/2016   Cat bite of right hand 12/21/2016   BPPV (benign paroxysmal positional vertigo) 07/12/2016   Memory loss 04/11/2016   Mallet toe of right foot 10/20/2015   Renal artery stenosis (HCC) 09/27/2015   Hyperlipidemia 06/30/2015   Claudication (HCC) 05/04/2015   Atherosclerotic PVD with intermittent claudication (HCC) 04/26/2015   Tinnitus 12/31/2014   Former smoker 09/29/2014   DNR (do not resuscitate) 09/29/2014   Hyperglycemia 07/19/2014   Multinodular goiter 08/30/2013   Spinal stenosis of lumbar region at multiple levels 09/29/2012   HIP PAIN, BILATERAL 07/16/2010   COPD (chronic obstructive pulmonary disease) (HCC) 09/20/2009   CONSTIPATION, CHRONIC 09/20/2009   Iron deficiency anemia 11/09/2007   History of UTI 11/09/2007   RESTLESS LEG SYNDROME 09/25/2007   Essential hypertension 09/25/2007   GERD 09/25/2007   LOW BACK PAIN 09/25/2007   Osteoporosis 09/25/2007   PCP:  Shelva Majestic, MD Pharmacy:   [facility] - [place], [place] - [address] [place] [address] Phone: [phone] Fax: [phone]  [id number] [place], [place] - [address] AT [facility] OF [address] [place] [address] Phone: [phone] Fax: [phone]  [facility] - [place], [place] - [address] [place] [address] Phone: [phone] Fax: [phone]     Social Determinants of Health (SDOH) Interventions     Readmission Risk Interventions No flowsheet data found.

## 2021-08-30 NOTE — Progress Notes (Signed)
Pt arrived to floor around 1245 via stretcher by ED tech. Pt transferred from stretcher to hospital bed x 2 assist. Pt is weak but turns well. Pt alert and oriented x 4 in no acute distress. VSS. Respirations even and unlabored on room air. Assessment completed. Skin checked with Paulino Rily RN. Pt oriented to room. Pt encouraged to use call bell for assistance. Call bell placed within pt's reach. Bed in low position.

## 2021-08-30 NOTE — Progress Notes (Signed)
OT Cancellation Note  Patient Details Name: Cheryl Atkinson MRN: [id number] DOB: [birth year]   Cancelled Treatment:    Reason Eval/Treat Not Completed: Patient transferred to 5W from ED, OT to follow up as schedule allows.    Jeanene Mena D Priscilla Kirstein 08/30/2021, 4:07 PM

## 2021-08-30 NOTE — ED Notes (Signed)
Pts son and sister updated at this time

## 2021-08-30 NOTE — Evaluation (Signed)
Physical Therapy Evaluation Patient Details Name: Cheryl Atkinson MRN: [id number] DOB: 12-14-1930 Today's Date: 08/30/2021  History of Present Illness  Pt is an 87 y/o female admitted 9/28 secondary to increased weakness. Found to be Covid+. PMH includes HTN, a fib, COPD, CHF.  Clinical Impression  Pt admitted secondary to problem above with deficits below. Pt requiring mod to max A for bed mobility. Attempted to stand, however, pt with heavy posterior lean and unable to achieve full upright with max A. Feel pt would benefit from SNF level therapies at d/c. However, if family/aide able to provide necessary level  of assist, may be able to consider d/c home with resumption of HHPT. Will continue to follow acutely.      Recommendations for follow up therapy are one component of a multi-disciplinary discharge planning process, led by the attending physician.  Recommendations may be updated based on patient status, additional functional criteria and insurance authorization.  Follow Up Recommendations Supervision/Assistance - 24 hour;SNF (Unless family can provide necessary physical assist then can resume HHPT)    Equipment Recommendations  None recommended by PT    Recommendations for Other Services       Precautions / Restrictions Precautions Precautions: Fall Restrictions Weight Bearing Restrictions: No      Mobility  Bed Mobility Overal bed mobility: Needs Assistance Bed Mobility: Supine to Sit;Sit to Supine     Supine to sit: Max assist Sit to supine: Mod assist   General bed mobility comments: Max A for trunk assist and LE assist to come to sitting. Mod A for LE assist to return to supine.    Transfers Overall transfer level: Needs assistance Equipment used: Rolling walker (2 wheeled) Transfers: Sit to/from Stand           General transfer comment: Attempted to stand with max A, however, pt with heavy posterior lean and unable to achieve full  upright.  Ambulation/Gait                Stairs            Wheelchair Mobility    Modified Rankin (Stroke Patients Only)       Balance Overall balance assessment: Needs assistance Sitting-balance support: No upper extremity supported;Feet supported Sitting balance-Leahy Scale: Good         Standing balance comment: unable                             Pertinent Vitals/Pain Pain Assessment: No/denies pain    Home Living Family/patient expects to be discharged to:: Private residence Living Arrangements: Children Available Help at Discharge: Family;Personal care attendant;Available 24 hours/day Type of Home: House Home Access: Level entry     Home Layout: One level Home Equipment: Walker - 2 wheels;Walker - 4 wheels;Bedside commode;Shower seat;Grab bars - toilet;Grab bars - tub/shower;Wheelchair - manual Additional Comments: Aide is there when son is not there    Prior Function Level of Independence: Needs assistance   Gait / Transfers Assistance Needed: Uses RW for ambulation but needs assist. Uses WC for mobility as well  ADL's / Homemaking Assistance Needed: Aide assists with ADLs.        Hand Dominance        Extremity/Trunk Assessment   Upper Extremity Assessment Upper Extremity Assessment: Defer to OT evaluation (R hand in splint)    Lower Extremity Assessment Lower Extremity Assessment: Generalized weakness    Cervical / Trunk Assessment Cervical / Trunk  Assessment: Kyphotic  Communication   Communication: No difficulties  Cognition Arousal/Alertness: Awake/alert Behavior During Therapy: WFL for tasks assessed/performed Overall Cognitive Status: No family/caregiver present to determine baseline cognitive functioning                                        General Comments General comments (skin integrity, edema, etc.): No family present    Exercises     Assessment/Plan    PT Assessment Patient  needs continued PT services  PT Problem List Decreased strength;Decreased range of motion;Decreased activity tolerance;Decreased balance;Decreased mobility;Decreased knowledge of use of DME;Decreased safety awareness;Decreased knowledge of precautions       PT Treatment Interventions DME instruction;Gait training;Stair training;Functional mobility training;Therapeutic activities;Therapeutic exercise;Balance training;Patient/family education    PT Goals (Current goals can be found in the Care Plan section)  Acute Rehab PT Goals Patient Stated Goal: to go home PT Goal Formulation: With patient Time For Goal Achievement: 09/13/21 Potential to Achieve Goals: Good    Frequency Min 3X/week   Barriers to discharge        Co-evaluation               AM-PAC PT "6 Clicks" Mobility  Outcome Measure Help needed turning from your back to your side while in a flat bed without using bedrails?: A Lot Help needed moving from lying on your back to sitting on the side of a flat bed without using bedrails?: A Lot Help needed moving to and from a bed to a chair (including a wheelchair)?: Total Help needed standing up from a chair using your arms (e.g., wheelchair or bedside chair)?: Total Help needed to walk in hospital room?: Total Help needed climbing 3-5 steps with a railing? : Total 6 Click Score: 8    End of Session   Activity Tolerance: Patient tolerated treatment well Patient left: in bed;with call bell/phone within reach;with bed alarm set Nurse Communication: Mobility status PT Visit Diagnosis: Unsteadiness on feet (R26.81);Muscle weakness (generalized) (M62.81);Difficulty in walking, not elsewhere classified (R26.2)    Time: 2671-2458 PT Time Calculation (min) (ACUTE ONLY): 21 min   Charges:   PT Evaluation $PT Eval Moderate Complexity: 1 Mod          Farley Ly, PT, DPT  Acute Rehabilitation Services  Pager: [phone] Office: [phone]   Lehman Prom 08/30/2021, 4:48 PM

## 2021-08-30 NOTE — Progress Notes (Signed)
PROGRESS NOTE        PATIENT DETAILS Name: Cheryl Atkinson Age: 87 y.o. Sex: female Date of Birth: 03-Jul-1931 Admit Date: 08/29/2021 Admitting Physician Orland Mustard, MD HRC:BULAGT, [name], MD  Brief Narrative: Patient is a 87 y.o. female with history of HTN, HLD, HFpEF, RLS, PAF, COPD-who presents with 2-day history of poor oral intake/intermittent nausea-and generalized weakness-she was found to be febrile on initial presentation-and subsequent work-up revealed COVID-19 infection.  See below for further details.  Subjective: Lying comfortably in bed-denies any chest pain or shortness of breath.  Objective: Vitals: Blood pressure (!) 166/78, pulse 88, temperature (P) 98.4 F (36.9 C), resp. rate (!) 22, height 5\' 6"  (1.676 m), weight 62.6 kg, SpO2 91 %.   Exam: Gen Exam:Alert awake-not in any distress HEENT:atraumatic, normocephalic Chest: B/L clear to auscultation anteriorly CVS:S1S2 regular Abdomen:soft non tender, non distended Extremities:no edema Neurology: Non focal Skin: no rash  Pertinent Labs/Radiology: WBC: 3.1 Hb: 8.8 Na: 137 K: 3.2 Creatinine: 0.7 Mg: 1.5  9/28>>Blood culture: Pending 9/28>>Urine Culture: Multiple species   9/28>> CT head: No acute intracranial pathology 9/28>> CT angio chest: No PE.  Ununited right clavicular fracture/subacute bilateral rib fractures. 9/28>> CT abdomen/pelvis: Left renal lesion unchanged since 2021-hemorrhagic/proteinaceous cyst, mild distention of the cecum  Assessment/Plan: COVID-19 infection: Still very weak-nauseous-continue IVF/molnuparivir.  No clinical signs of pneumonia-no pneumonia on imaging.    Generalized weakness/debility: Significantly weak-likely due to above-needs PT/OT to ensure safe disposition.  Pancytopenia: Likely due to COVID-19 infection-follow periodically.  Hypokalemia: Replete and recheck  Hypomagnesemia: Replete and recheck  COPD: Stable-continue  bronchodilators  HFpEF: Euvolemic-continue diuretics-however change Lasix to daily dosing and reassess volume status daily.  HTN: Controlled-continue Avapro  HLD: Continue statin  Chronic atrial fibrillation: Rate controlled-on Eliquis  RLS: Resume transdermal Neupro-and as needed Sinemet.  Left renal lesion: Chronic issue-we will need outpatient renal ultrasound-we will defer to PCP  Distended cecum on CT scan: Unclear clinical significance-abdominal exam is benign.  Procedures: None Consults: None DVT Prophylaxis: Eliquis Code Status: DNR Family Communication: Son-[phone]-called-unable to leave voicemail-no answer.  Time spent: 35 minutes-Greater than 50% of this time was spent in counseling, explanation of diagnosis, planning of further management, and coordination of care.  Diet: Diet Order             Diet heart healthy/carb modified Room service appropriate? Yes; Fluid consistency: Thin  Diet effective now                      Disposition Plan: Status is: Observation  The patient will require care spanning > 2 midnights and should be moved to inpatient because: Inpatient level of care appropriate due to severity of illness  Dispo: The patient is from: Home              Anticipated d/c is to: SNF vs HHPT              Patient currently is not medically stable to d/c.   Difficult to place patient No    Barriers to Discharge: COVID-19 infection with severe weakness/debility-needs PT/OT evaluation to determine appropriate disposition.  Antimicrobial agents: Anti-infectives (From admission, onward)    Start     Dose/Rate Route Frequency Ordered Stop   08/29/21 2200  molnupiravir EUA (LAGEVRIO) capsule 800 mg        4  capsule Oral 2 times daily 08/29/21 1947 09/03/21 2159   08/29/21 1600  cefTRIAXone (ROCEPHIN) 2 g in sodium chloride 0.9 % 100 mL IVPB        2 g 200 mL/hr over 30 Minutes Intravenous  Once 08/29/21 1554 08/29/21 1757   08/29/21  1600  azithromycin (ZITHROMAX) 500 mg in sodium chloride 0.9 % 250 mL IVPB        500 mg 250 mL/hr over 60 Minutes Intravenous  Once 08/29/21 1554 08/29/21 1757        MEDICATIONS: Scheduled Meds:  apixaban  5 mg Oral BID   vitamin C  500 mg Oral Daily   ferrous sulfate  325 mg Oral Once per day on Mon Thu   furosemide  40 mg Oral BID   irbesartan  300 mg Oral Daily   molnupiravir EUA  4 capsule Oral BID   potassium chloride  40 mEq Oral Q6H   rosuvastatin  40 mg Oral Daily   zinc sulfate  220 mg Oral Daily   Continuous Infusions: PRN Meds:.acetaminophen, albuterol, ondansetron **OR** ondansetron (ZOFRAN) IV, polyethylene glycol, traZODone   I have personally reviewed following labs and imaging studies  LABORATORY DATA: CBC: Recent Labs  Lab 08/29/21 1405 08/30/21 0308  WBC 6.0 3.1*  NEUTROABS 5.0 2.1  HGB 10.1* 8.8*  HCT 32.1* 28.3*  MCV 90.9 92.2  PLT 181 134*    Basic Metabolic Panel: Recent Labs  Lab 08/29/21 1405 08/29/21 2000 08/30/21 0308  NA 137  --  137  K 2.8*  --  3.2*  CL 101  --  104  CO2 27  --  23  GLUCOSE 126*  --  91  BUN 13  --  9  CREATININE 0.88  --  0.70  CALCIUM 8.8*  --  8.0*  MG  --  1.6* 1.5*  PHOS  --   --  2.6    GFR: Estimated Creatinine Clearance: 45.5 mL/min (by C-G formula based on SCr of 0.7 mg/dL).  Liver Function Tests: Recent Labs  Lab 08/29/21 1405 08/30/21 0308  AST 19 17  ALT 12 13  ALKPHOS 112 96  BILITOT 0.6 0.8  PROT 7.0 5.7*  ALBUMIN 3.2* 2.8*   No results for input(s): LIPASE, AMYLASE in the last 168 hours. Recent Labs  Lab 08/29/21 1646  AMMONIA 11    Coagulation Profile: Recent Labs  Lab 08/29/21 1405  INR 1.4*    Cardiac Enzymes: No results for input(s): CKTOTAL, CKMB, CKMBINDEX, TROPONINI in the last 168 hours.  BNP (last 3 results) No results for input(s): PROBNP in the last 8760 hours.  Lipid Profile: No results for input(s): CHOL, HDL, LDLCALC, TRIG, CHOLHDL, LDLDIRECT in  the last 72 hours.  Thyroid Function Tests: No results for input(s): TSH, T4TOTAL, FREET4, T3FREE, THYROIDAB in the last 72 hours.  Anemia Panel: Recent Labs    08/29/21 2000 08/30/21 0308  FERRITIN 124 117    Urine analysis:    Component Value Date/Time   COLORURINE YELLOW 08/29/2021 1352   APPEARANCEUR CLEAR 08/29/2021 1352   LABSPEC 1.010 08/29/2021 1352   PHURINE 6.0 08/29/2021 1352   GLUCOSEU NEGATIVE 08/29/2021 1352   HGBUR NEGATIVE 08/29/2021 1352   BILIRUBINUR NEGATIVE 08/29/2021 1352   BILIRUBINUR Negative 02/15/2020 1052   KETONESUR NEGATIVE 08/29/2021 1352   PROTEINUR 30 (A) 08/29/2021 1352   UROBILINOGEN 0.2 02/15/2020 1052   UROBILINOGEN 0.2 08/20/2019 1732   NITRITE NEGATIVE 08/29/2021 1352   LEUKOCYTESUR NEGATIVE 08/29/2021 1352  Sepsis Labs: Lactic Acid, Venous    Component Value Date/Time   LATICACIDVEN 1.2 08/29/2021 1646    MICROBIOLOGY: Recent Results (from the past 240 hour(s))  Resp Panel by RT-PCR (Flu A&B, Covid) Nasopharyngeal Swab     Status: Abnormal   Collection Time: 08/29/21  2:05 PM   Specimen: Nasopharyngeal Swab; Nasopharyngeal(NP) swabs in vial transport medium  Result Value Ref Range Status   SARS Coronavirus 2 by RT PCR POSITIVE (A) NEGATIVE Final    Comment: RESULT CALLED TO, READ BACK BY AND VERIFIED WITH: RN E.BANKS AT 1609 ON 08/29/2021 BY T.SAAD. (NOTE) SARS-CoV-2 target nucleic acids are DETECTED.  The SARS-CoV-2 RNA is generally detectable in upper respiratory specimens during the acute phase of infection. Positive results are indicative of the presence of the identified virus, but do not rule out bacterial infection or co-infection with other pathogens not detected by the test. Clinical correlation with patient history and other diagnostic information is necessary to determine patient infection status. The expected result is Negative.  Fact Sheet for Patients: BloggerCourse.com  Fact  Sheet for Healthcare Providers: SeriousBroker.it  This test is not yet approved or cleared by the [place] FDA and  has been authorized for detection and/or diagnosis of SARS-CoV-2 by FDA under an Emergency Use Authorization (EUA).  This EUA will remain in effect (meaning this t est can be used) for the duration of  the COVID-19 declaration under Section 564(b)(1) of the Act, 21 U.S.C. section 360bbb-3(b)(1), unless the authorization is terminated or revoked sooner.     Influenza A by PCR NEGATIVE NEGATIVE Final   Influenza B by PCR NEGATIVE NEGATIVE Final    Comment: (NOTE) The Xpert Xpress SARS-CoV-2/FLU/RSV plus assay is intended as an aid in the diagnosis of influenza from Nasopharyngeal swab specimens and should not be used as a sole basis for treatment. Nasal washings and aspirates are unacceptable for Xpert Xpress SARS-CoV-2/FLU/RSV testing.  Fact Sheet for Patients: BloggerCourse.com  Fact Sheet for Healthcare Providers: SeriousBroker.it  This test is not yet approved or cleared by the [place] FDA and has been authorized for detection and/or diagnosis of SARS-CoV-2 by FDA under an Emergency Use Authorization (EUA). This EUA will remain in effect (meaning this test can be used) for the duration of the COVID-19 declaration under Section 564(b)(1) of the Act, 21 U.S.C. section 360bbb-3(b)(1), unless the authorization is terminated or revoked.  Performed at [facility], [address] [place], [place] [address]   Urine Culture     Status: Abnormal   Collection Time: 08/29/21  2:20 PM   Specimen: In/Out Cath Urine  Result Value Ref Range Status   Specimen Description IN/OUT CATH URINE  Final   Special Requests   Final    NONE Performed at [facility], [address], [place], [place] [address]    Culture MULTIPLE SPECIES PRESENT, SUGGEST RECOLLECTION (A)  Final    Report Status 08/30/2021 FINAL  Final    RADIOLOGY STUDIES/RESULTS: CT HEAD WO CONTRAST ( )  Result Date: 08/29/2021 CLINICAL DATA:  Headaches EXAM: CT HEAD WITHOUT CONTRAST TECHNIQUE: Contiguous axial images were obtained from the base of the skull through the vertex without intravenous contrast. COMPARISON:  None. FINDINGS: Brain: No evidence of acute infarction, hemorrhage, extra-axial collection, ventriculomegaly, or mass effect. Generalized cerebral atrophy. Periventricular white matter low attenuation likely secondary to microangiopathy. Vascular: Cerebrovascular atherosclerotic calcifications are noted. Skull: Negative for fracture or focal lesion. Sinuses/Orbits: Visualized portions of the orbits are unremarkable. Visualized  portions of the paranasal sinuses are unremarkable. Visualized portions of the mastoid air cells are unremarkable. Other: None. IMPRESSION: No acute intracranial pathology. Electronically Signed   By: Elige Ko M.D.   On: 08/29/2021 17:01   CT Angio Chest PE W and/or Wo Contrast  Result Date: 08/29/2021 CLINICAL DATA:  Weakness, headache with nausea vomiting and shortness of breath in an 87 year old female. EXAM: CT ANGIOGRAPHY CHEST CT ABDOMEN AND PELVIS WITH CONTRAST TECHNIQUE: Multidetector CT imaging of the chest was performed using the standard protocol during bolus administration of intravenous contrast. Multiplanar CT image reconstructions and MIPs were obtained to evaluate the vascular anatomy. Multidetector CT imaging of the abdomen and pelvis was performed using the standard protocol during bolus administration of intravenous contrast. CONTRAST:  24mL OMNIPAQUE IOHEXOL 350 MG/ML SOLN COMPARISON:  Clavicular x-rays of August 15, 2021. Abdomen pelvis CT from May 16, 2020. FINDINGS: CTA CHEST FINDINGS Cardiovascular: Calcified aortic atherosclerosis without aneurysmal dilation of the thoracic aorta. Calcification and soft plaque extending into branch vessels in  the chest. Limited assessment due to bolus timing. Cardiac size with moderate to marked cardiac enlargement. Three-vessel coronary artery disease. No substantial pericardial effusion. Central pulmonary arteries with density of 620 Hounsfield units. Mildly limited assessment at the lung bases. No signs of pulmonary embolism to the segmental level at the lung bases or elsewhere in the chest. Mediastinum/Nodes: No signs of adenopathy in the chest. Esophagus grossly normal by CT. Lungs/Pleura: No pneumothorax, mild basilar atelectasis. Small layering bilateral effusions. Airways are patent. Musculoskeletal: Ununited RIGHT clavicular fracture shows a similar appearance to imaging from August 15, 2021. Chondroid lesion in the RIGHT proximal humerus. Osteopenia. Subacute rib fractures along the LEFT chest involving the third, fourth, fifth anterior ribs. Displaced subacute fractures of LEFT-sided ribs involving LEFT second, third and fourth ribs as well as the fifth rib without displacement. Spinal degenerative changes about the thoracic spine. Healed fractures of the sternum Review of the MIP images confirms the above findings. CT ABDOMEN and PELVIS FINDINGS Hepatobiliary: Liver with smooth contours. No focal, suspicious hepatic lesion. Post cholecystectomy with patent portal vein. Biliary duct dilation, common bile duct approximately 9 mm. No substantial intrahepatic biliary duct distension. No perihepatic stranding or fluid. Pancreas: Signs of pancreatic atrophy. No visible inflammation or lesion. Atrophy more pronounced in the head of the pancreas. No ductal dilation. Spleen: Normal. Adrenals/Urinary Tract: Small renal lesion unchanged since 2021 arises from the lateral LEFT kidney measuring approximately 11 mm and 60 Hounsfield units. Numerous bladder diverticuli.  No perivesical stranding. No hydronephrosis. No suspicious renal lesion on the RIGHT. Stomach/Bowel: No acute gastrointestinal process. Colonic  diverticulosis. Appendix not visualized. No pericecal inflammation. Cecum displays a position that oriented more towards the upper abdomen. Maximal caliber approximately 7 cm. No pericecal stranding. No twist in the colon. Mild distortion of the mesentery without frank twisting. Approximally 180 degree rotation of distal small bowel mesentery. Vascular/Lymphatic: Aortic atherosclerosis. No sign of aneurysm. Smooth contour of the IVC. There is no gastrohepatic or hepatoduodenal ligament lymphadenopathy. No retroperitoneal or mesenteric lymphadenopathy. No pelvic sidewall lymphadenopathy. .2.5 cm maximal caliber of the infrarenal abdominal aorta is unchanged since previous imaging. Reproductive: Post hysterectomy, no adnexal mass. Other: No free air.  No sign of ascites. Musculoskeletal: Osteopenia. Spinal degenerative changes. Interval near complete loss of height of the L2 vertebral body since the prior CT study from May 16, 2020, surrounding degenerative changes implant chronicity, in fact this was shown to be fractured on January 27, 2021 but shows  further loss of height since that time previously approximately 50% loss of height now approximately 80% loss of height. Bony retropulsion at this level has increased since previous imaging, proximally 6-7 mm bony retropulsion at the level of the L2 fracture. Interval worsening loss of height is age indeterminate and there is moderate central canal narrowing associated with this finding. Review of the MIP images confirms the above findings. IMPRESSION: No signs of pulmonary embolism limited at the subsegmental level in the lung bases. Small layering bilateral effusions. Moderate to marked cardiac enlargement. Three-vessel coronary artery disease. Worsening loss of height of the L2 vertebral level with substantial surrounding degenerative change. Now with approximately 80% loss of height and moderate canal narrowing due to retropulsion of posterior cortical elements.  Correlate with any acute pain in this area with further imaging as warranted. Small LEFT renal lesion unchanged since studies from 2021, potentially hemorrhagic or proteinaceous cyst, small indolent renal neoplasm is possible, f/u renal sonogram is suggested when the patient is able. Mild distension of the cecum with mid abdominal position without signs of twist of the bowel. No frank mesenteric twist. This could be due to a mobile cecum which can have an association with cecal volvulus, no current evidence. If there is worsening of abdominal pain could consider repeat imaging as warranted. Numerous bladder diverticuli. Ununited RIGHT clavicular fracture and subacute bilateral rib fractures. Electronically Signed   By: Donzetta Kohut M.D.   On: 08/29/2021 17:34   CT ABDOMEN PELVIS W CONTRAST  Result Date: 08/29/2021 CLINICAL DATA:  Weakness, headache with nausea vomiting and shortness of breath in an 87 year old female. EXAM: CT ANGIOGRAPHY CHEST CT ABDOMEN AND PELVIS WITH CONTRAST TECHNIQUE: Multidetector CT imaging of the chest was performed using the standard protocol during bolus administration of intravenous contrast. Multiplanar CT image reconstructions and MIPs were obtained to evaluate the vascular anatomy. Multidetector CT imaging of the abdomen and pelvis was performed using the standard protocol during bolus administration of intravenous contrast. CONTRAST:  73mL OMNIPAQUE IOHEXOL 350 MG/ML SOLN COMPARISON:  Clavicular x-rays of August 15, 2021. Abdomen pelvis CT from May 16, 2020. FINDINGS: CTA CHEST FINDINGS Cardiovascular: Calcified aortic atherosclerosis without aneurysmal dilation of the thoracic aorta. Calcification and soft plaque extending into branch vessels in the chest. Limited assessment due to bolus timing. Cardiac size with moderate to marked cardiac enlargement. Three-vessel coronary artery disease. No substantial pericardial effusion. Central pulmonary arteries with density of 620  Hounsfield units. Mildly limited assessment at the lung bases. No signs of pulmonary embolism to the segmental level at the lung bases or elsewhere in the chest. Mediastinum/Nodes: No signs of adenopathy in the chest. Esophagus grossly normal by CT. Lungs/Pleura: No pneumothorax, mild basilar atelectasis. Small layering bilateral effusions. Airways are patent. Musculoskeletal: Ununited RIGHT clavicular fracture shows a similar appearance to imaging from August 15, 2021. Chondroid lesion in the RIGHT proximal humerus. Osteopenia. Subacute rib fractures along the LEFT chest involving the third, fourth, fifth anterior ribs. Displaced subacute fractures of LEFT-sided ribs involving LEFT second, third and fourth ribs as well as the fifth rib without displacement. Spinal degenerative changes about the thoracic spine. Healed fractures of the sternum Review of the MIP images confirms the above findings. CT ABDOMEN and PELVIS FINDINGS Hepatobiliary: Liver with smooth contours. No focal, suspicious hepatic lesion. Post cholecystectomy with patent portal vein. Biliary duct dilation, common bile duct approximately 9 mm. No substantial intrahepatic biliary duct distension. No perihepatic stranding or fluid. Pancreas: Signs of pancreatic atrophy. No visible inflammation  or lesion. Atrophy more pronounced in the head of the pancreas. No ductal dilation. Spleen: Normal. Adrenals/Urinary Tract: Small renal lesion unchanged since 2021 arises from the lateral LEFT kidney measuring approximately 11 mm and 60 Hounsfield units. Numerous bladder diverticuli.  No perivesical stranding. No hydronephrosis. No suspicious renal lesion on the RIGHT. Stomach/Bowel: No acute gastrointestinal process. Colonic diverticulosis. Appendix not visualized. No pericecal inflammation. Cecum displays a position that oriented more towards the upper abdomen. Maximal caliber approximately 7 cm. No pericecal stranding. No twist in the colon. Mild distortion  of the mesentery without frank twisting. Approximally 180 degree rotation of distal small bowel mesentery. Vascular/Lymphatic: Aortic atherosclerosis. No sign of aneurysm. Smooth contour of the IVC. There is no gastrohepatic or hepatoduodenal ligament lymphadenopathy. No retroperitoneal or mesenteric lymphadenopathy. No pelvic sidewall lymphadenopathy. .2.5 cm maximal caliber of the infrarenal abdominal aorta is unchanged since previous imaging. Reproductive: Post hysterectomy, no adnexal mass. Other: No free air.  No sign of ascites. Musculoskeletal: Osteopenia. Spinal degenerative changes. Interval near complete loss of height of the L2 vertebral body since the prior CT study from May 16, 2020, surrounding degenerative changes implant chronicity, in fact this was shown to be fractured on January 27, 2021 but shows further loss of height since that time previously approximately 50% loss of height now approximately 80% loss of height. Bony retropulsion at this level has increased since previous imaging, proximally 6-7 mm bony retropulsion at the level of the L2 fracture. Interval worsening loss of height is age indeterminate and there is moderate central canal narrowing associated with this finding. Review of the MIP images confirms the above findings. IMPRESSION: No signs of pulmonary embolism limited at the subsegmental level in the lung bases. Small layering bilateral effusions. Moderate to marked cardiac enlargement. Three-vessel coronary artery disease. Worsening loss of height of the L2 vertebral level with substantial surrounding degenerative change. Now with approximately 80% loss of height and moderate canal narrowing due to retropulsion of posterior cortical elements. Correlate with any acute pain in this area with further imaging as warranted. Small LEFT renal lesion unchanged since studies from 2021, potentially hemorrhagic or proteinaceous cyst, small indolent renal neoplasm is possible, f/u renal  sonogram is suggested when the patient is able. Mild distension of the cecum with mid abdominal position without signs of twist of the bowel. No frank mesenteric twist. This could be due to a mobile cecum which can have an association with cecal volvulus, no current evidence. If there is worsening of abdominal pain could consider repeat imaging as warranted. Numerous bladder diverticuli. Ununited RIGHT clavicular fracture and subacute bilateral rib fractures. Electronically Signed   By: Donzetta Kohut M.D.   On: 08/29/2021 17:34   DG Chest Port 1 View  Result Date: 08/29/2021 CLINICAL DATA:  Questionable sepsis EXAM: PORTABLE CHEST 1 VIEW COMPARISON:  01/14/2021 FINDINGS: Cardiomegaly. No confluent opacities, effusions or edema. No acute bony abnormality. IMPRESSION: Cardiomegaly.  No active disease. Electronically Signed   By: Charlett Nose M.D.   On: 08/29/2021 14:33     LOS: 0 days   Jeoffrey Massed, MD  [facility]    To contact the attending provider between 7A-7P or the covering provider during after hours 7P-7A, please log into the web site [email or web address] and access using universal [place] password for that web site. If you do not have the password, please call the hospital operator.  08/30/2021, 11:14 AM

## 2021-08-31 DIAGNOSIS — K219 Gastro-esophageal reflux disease without esophagitis: Secondary | ICD-10-CM

## 2021-08-31 LAB — COMPREHENSIVE METABOLIC PANEL
ALT: 17 U/L (ref 0–44)
AST: 24 U/L (ref 15–41)
Albumin: 3.3 g/dL — ABNORMAL LOW (ref 3.5–5.0)
Alkaline Phosphatase: 116 U/L (ref 38–126)
Anion gap: 13 (ref 5–15)
BUN: 10 mg/dL (ref 8–23)
CO2: 25 mmol/L (ref 22–32)
Calcium: 8.2 mg/dL — ABNORMAL LOW (ref 8.9–10.3)
Chloride: 98 mmol/L (ref 98–111)
Creatinine, Ser: 0.84 mg/dL (ref 0.44–1.00)
GFR, Estimated: >60 mL/min (ref >60)
Glucose, Bld: 101 mg/dL — ABNORMAL HIGH (ref 70–99)
Potassium: 3.6 mmol/L (ref 3.5–5.1)
Sodium: 136 mmol/L (ref 135–145)
Total Bilirubin: 0.9 mg/dL (ref 0.3–1.2)
Total Protein: 7.1 g/dL (ref 6.5–8.1)

## 2021-08-31 LAB — CBC WITH DIFFERENTIAL/PLATELET
Abs Immature Granulocytes: 0.02 10*3/uL (ref 0.00–0.07)
Basophils Absolute: 0 10*3/uL (ref 0.0–0.1)
Basophils Relative: 1 %
Eosinophils Absolute: 0 10*3/uL (ref 0.0–0.5)
Eosinophils Relative: 0 %
HCT: 35.5 % — ABNORMAL LOW (ref 36.0–46.0)
Hemoglobin: 11.4 g/dL — ABNORMAL LOW (ref 12.0–15.0)
Immature Granulocytes: 1 %
Lymphocytes Relative: 16 %
Lymphs Abs: 0.7 10*3/uL (ref 0.7–4.0)
MCH: 28.6 pg (ref 26.0–34.0)
MCHC: 32.1 g/dL (ref 30.0–36.0)
MCV: 89.2 fL (ref 80.0–100.0)
Monocytes Absolute: 0.4 10*3/uL (ref 0.1–1.0)
Monocytes Relative: 11 %
Neutro Abs: 3 10*3/uL (ref 1.7–7.7)
Neutrophils Relative %: 71 %
Platelets: 144 10*3/uL — ABNORMAL LOW (ref 150–400)
RBC: 3.98 MIL/uL (ref 3.87–5.11)
RDW: 15.9 % — ABNORMAL HIGH (ref 11.5–15.5)
WBC: 4.2 10*3/uL (ref 4.0–10.5)
nRBC: 0 % (ref 0.0–0.2)

## 2021-08-31 LAB — MAGNESIUM: Magnesium: 2.2 mg/dL (ref 1.7–2.4)

## 2021-08-31 MED ORDER — APIXABAN 2.5 MG PO TABS
2.5000 mg | ORAL_TABLET | Freq: Two times a day (BID) | ORAL | Status: DC
  Administered 2021-08-31 – 2021-09-08 (×16): 2.5 mg via ORAL
  Filled 2021-08-31 (×17): qty 1

## 2021-08-31 MED ORDER — HYDROMORPHONE HCL 1 MG/ML IJ SOLN
0.5000 mg | INTRAMUSCULAR | Status: AC | PRN
  Administered 2021-08-31 (×3): 0.5 mg via INTRAVENOUS
  Filled 2021-08-31 (×3): qty 0.5

## 2021-08-31 MED ORDER — POLYVINYL ALCOHOL 1.4 % OP SOLN: 1.0000 [drp] | OPHTHALMIC | Status: DC | PRN

## 2021-08-31 MED ORDER — HYDRALAZINE HCL 20 MG/ML IJ SOLN
10.0000 mg | Freq: Once | INTRAMUSCULAR | Status: AC
  Administered 2021-08-31: 10 mg via INTRAVENOUS
  Filled 2021-08-31: qty 1

## 2021-08-31 MED ORDER — TRAMADOL HCL 50 MG PO TABS
50.0000 mg | ORAL_TABLET | Freq: Four times a day (QID) | ORAL | Status: AC | PRN
  Administered 2021-08-31 – 2021-09-01 (×2): 50 mg via ORAL
  Filled 2021-08-31 (×2): qty 1

## 2021-08-31 NOTE — Consult Note (Signed)
   [facility] Inpatient Consult   08/31/2021  DAZANI NORBY 1931/10/11 [id number]  [facility] [[facility]]  Accountable Care Organization [ACO] Patient: Medicare CMS DCE  Primary Care Provider:  Shelva Majestic, MD, [place] is an Embedded provider with a Chronic Care Management program and team.  This provider is listed to the [facility] calls and follow up.  Patient was screened for [facility] [[facility]]  Care Management services. Patient will have the transition of care call conducted by the primary care provider. This patient is active in the Embedded practice with the  chronic disease management Embedded Care Management team.  Plan: Notification sent to the [facility] RN Embedded Care Management for updates.  Will continue to follow for progress and needs.  Please contact for further questions,  Charlesetta Shanks, RN BSN CCM [facility]  [phone] business mobile phone Toll free office [phone]  Fax number: [phone] [email or web address]

## 2021-08-31 NOTE — Progress Notes (Signed)
Pt's sister, Steward Drone, stated she would bring in pt's Neupro patches for restless leg syndrome tomorrow morning. Asked sister to make nurse aware when she brings it in so nurse can send it to the pharmacy.

## 2021-08-31 NOTE — TOC Progression Note (Signed)
Transition of Care [facility]) - Progression Note    Patient Details  Name: Cheryl Atkinson MRN: [id number] Date of Birth: 08/22/1931  Transition [facility]) CM/SW Contact  Aara Jacquot, LCSWA Phone Number: 08/31/2021, 5:29 PM  Clinical Narrative:    CSW received consult for possible SNF placement at time of discharge. CSW spoke with patient's sister POA, Steward Drone. Patient's sister reported that patient's son is currently unable to care for patient at their home given patient's current physical needs and fall risk as he doesn't help much and has a caregiver in place. Patient's sister expressed understanding of PT recommendation and is agreeable to SNF placement at time of discharge. Patient's sister reports preference for [facility] and that [name] has been to [facility] before. CSW discussed insurance authorization process and will provide Medicare SNF ratings list. Patient has received the COVID vaccines. Patient's sister expressed being hopeful for rehab and for her sister [name] to feel better soon. No further questions reported at this time.   Skilled Nursing Rehab Facilities-   [email or web address] Ratings out of 5 possible    Name Address  Phone # Quality Care Staffing Health Inspection Overall  [facility] [address], [place] [phone] 5 1 4 4   [facility]  [address], [place] [phone] 3 1 5 4   [facility] [address] [place], [address] [place] 3 1 1 1   [facility] [address] 2 2 4 4   [facility] [address], [phone] 3 1 2 1   [facility] [phone] [address], [phone] 3 2 4 4   [facility] [address], [address] [place] 5 1 2 2   [facility] [address], [facility] [place] 5 2 2 3   [facility] [address] [facility] 5 1 2 2   [facility]  Nursing [phone], [phone] 5 1 2 2   [facility] [address], [facility] [phone] 5 1 2 2   [facility]. [address] 3 1 1 1       Expected Discharge Plan: Skilled Nursing Facility Barriers to Discharge: Continued Medical Work up, SNF Covid Recovering  Expected Discharge Plan and Services Expected Discharge Plan: Skilled Nursing Facility   Discharge Planning Services: CM Consult   Living arrangements for the past 2 months: Single Family Home                                       Social Determinants of Health (SDOH) Interventions    Readmission Risk Interventions No flowsheet data found.

## 2021-08-31 NOTE — Progress Notes (Signed)
PROGRESS NOTE        PATIENT DETAILS Name: Cheryl Atkinson Age: 87 y.o. Sex: female Date of Birth: [birth year] Admit Date: 08/29/2021 Admitting Physician Dewayne Shorter Levora Dredge, MD NBV:APOLID, [name], MD  Brief Narrative: Patient is a 87 y.o. female with history of HTN, HLD, HFpEF, RLS, PAF, COPD-who presents with 2-day history of poor oral intake/intermittent nausea-and generalized weakness-she was found to be febrile on initial presentation-and subsequent work-up revealed COVID-19 infection.  See below for further details.  Subjective: Thinks she feels better-but continues to complain of generalized weakness.  No nausea or vomiting.  No shortness of breath or chest pain.  Objective: Vitals: Blood pressure 140/82, pulse 80, temperature 98.4 F (36.9 C), temperature source Oral, resp. rate 20, height 5\' 7"  (1.702 m), weight 59 kg, SpO2 94 %.   Exam: Gen Exam:Alert awake-not in any distress HEENT:atraumatic, normocephalic Chest: B/L clear to auscultation anteriorly CVS:S1S2 regular Abdomen:soft non tender, non distended Extremities:no edema Neurology: Non focal Skin: no rash   Pertinent Labs/Radiology: WBC: 4.2 Hb: 11.4 Na: 136 K: 3.6 Creatinine: 0.84  Mg: 2.2  9/28>>Blood culture: No growth 9/28>>Urine Culture: Multiple species   9/28>> CT head: No acute intracranial pathology 9/28>> CT angio chest: No PE.  Ununited right clavicular fracture/subacute bilateral rib fractures. 9/28>> CT abdomen/pelvis: Left renal lesion unchanged since 2021-hemorrhagic/proteinaceous cyst, mild distention of the cecum  Assessment/Plan: COVID-19 infection: No hypoxia-no pneumonia on imaging-still with generalized weakness but somewhat better-continue molnuparivir.   Generalized weakness/debility: Due to above-evaluated by PT/OT-recommendations of SNF on discharge.    Pancytopenia: Likely due to COVID-19 infection-f improving slowly.  Hypokalemia:  Repleted.  Hypomagnesemia: Repleted  COPD: Stable-continue bronchodilators  HFpEF: Euvolemic-continue Lasix at daily dosing-change back to twice daily when more stable.   HTN: Controlled-continue Avapro and Lasix  HLD: Continue statin  Chronic atrial fibrillation: Rate controlled-on Eliquis  RLS: Continue transdermal Neupro-and as needed Sinemet.  Left renal lesion: Chronic issue-we will need outpatient renal ultrasound-we will defer to PCP  Distended cecum on CT scan: Unclear clinical significance-abdominal exam is benign.  Right little finger fracture: Splint in place-resume outpatient follow-up with orthopedics as previous.  Procedures: None Consults: None DVT Prophylaxis: Eliquis Code Status: DNR Family Communication: Sister-[phone] updated over the phone on 9/30  Time spent: 25 minutes-Greater than 50% of this time was spent in counseling, explanation of diagnosis, planning of further management, and coordination of care.  Diet: Diet Order             Diet heart healthy/carb modified Room service appropriate? No; Fluid consistency: Thin  Diet effective now                      Disposition Plan: Status is: Observation  The patient will require care spanning > 2 midnights and should be moved to inpatient because: Inpatient level of care appropriate due to severity of illness  Dispo: The patient is from: Home              Anticipated d/c is to: SNF vs HHPT              Patient currently is not medically stable to d/c.   Difficult to place patient No    Barriers to Discharge: COVID-19 infection with severe weakness/debility-needs PT/OT evaluation to determine appropriate disposition.  Antimicrobial agents: Anti-infectives (From admission, onward)  Start     Dose/Rate Route Frequency Ordered Stop   08/29/21 2200  molnupiravir EUA (LAGEVRIO) capsule 800 mg        4 capsule Oral 2 times daily 08/29/21 1947 09/03/21 2159   08/29/21 1600   cefTRIAXone (ROCEPHIN) 2 g in sodium chloride 0.9 % 100 mL IVPB        2 g 200 mL/hr over 30 Minutes Intravenous  Once 08/29/21 1554 08/29/21 1757   08/29/21 1600  azithromycin (ZITHROMAX) 500 mg in sodium chloride 0.9 % 250 mL IVPB        500 mg 250 mL/hr over 60 Minutes Intravenous  Once 08/29/21 1554 08/29/21 1757        MEDICATIONS: Scheduled Meds:  apixaban  2.5 mg Oral BID   vitamin C  500 mg Oral Daily   ferrous sulfate  325 mg Oral Once per day on Mon Thu   furosemide  40 mg Oral Daily   irbesartan  300 mg Oral Daily   molnupiravir EUA  4 capsule Oral BID   rosuvastatin  40 mg Oral Daily   Rotigotine  3 mg Transdermal QHS   zinc sulfate  220 mg Oral Daily   Continuous Infusions: PRN Meds:.acetaminophen, albuterol, carbidopa-levodopa, HYDROmorphone (DILAUDID) injection, ondansetron **OR** ondansetron (ZOFRAN) IV, polyethylene glycol, traZODone   I have personally reviewed following labs and imaging studies  LABORATORY DATA: CBC: Recent Labs  Lab 08/29/21 1405 08/30/21 0308 08/31/21 0118  WBC 6.0 3.1* 4.2  NEUTROABS 5.0 2.1 3.0  HGB 10.1* 8.8* 11.4*  HCT 32.1* 28.3* 35.5*  MCV 90.9 92.2 89.2  PLT 181 134* 144*     Basic Metabolic Panel: Recent Labs  Lab 08/29/21 1405 08/29/21 2000 08/30/21 0308 08/31/21 0118  NA 137  --  137 136  K 2.8*  --  3.2* 3.6  CL 101  --  104 98  CO2 27  --  23 25  GLUCOSE 126*  --  91 101*  BUN 13  --  9 10  CREATININE 0.88  --  0.70 0.84  CALCIUM 8.8*  --  8.0* 8.2*  MG  --  1.6* 1.5* 2.2  PHOS  --   --  2.6  --      GFR: Estimated Creatinine Clearance: 43.1 mL/min (by C-G formula based on SCr of 0.84 mg/dL).  Liver Function Tests: Recent Labs  Lab 08/29/21 1405 08/30/21 0308 08/31/21 0118  AST ALT ALKPHOS 112 96 116  BILITOT 0.6 0.8 0.9  PROT 7.0 5.7* 7.1  ALBUMIN 3.2* 2.8* 3.3*    No results for input(s): LIPASE, AMYLASE in the last 168 hours. Recent Labs  Lab 08/29/21 1646   AMMONIA 11     Coagulation Profile: Recent Labs  Lab 08/29/21 1405  INR 1.4*     Cardiac Enzymes: No results for input(s): CKTOTAL, CKMB, CKMBINDEX, TROPONINI in the last 168 hours.  BNP (last 3 results) No results for input(s): PROBNP in the last 8760 hours.  Lipid Profile: No results for input(s): CHOL, HDL, LDLCALC, TRIG, CHOLHDL, LDLDIRECT in the last 72 hours.  Thyroid Function Tests: No results for input(s): TSH, T4TOTAL, FREET4, T3FREE, THYROIDAB in the last 72 hours.  Anemia Panel: Recent Labs    08/29/21 2000 08/30/21 0308  FERRITIN 124 117     Urine analysis:    Component Value Date/Time   COLORURINE YELLOW 08/29/2021 1352   APPEARANCEUR CLEAR 08/29/2021 1352   LABSPEC 1.010 08/29/2021 1352  PHURINE 6.0 08/29/2021 1352   GLUCOSEU NEGATIVE 08/29/2021 1352   HGBUR NEGATIVE 08/29/2021 1352   BILIRUBINUR NEGATIVE 08/29/2021 1352   BILIRUBINUR Negative 02/15/2020 1052   KETONESUR NEGATIVE 08/29/2021 1352   PROTEINUR 30 (A) 08/29/2021 1352   UROBILINOGEN 0.2 02/15/2020 1052   UROBILINOGEN 0.2 08/20/2019 1732   NITRITE NEGATIVE 08/29/2021 1352   LEUKOCYTESUR NEGATIVE 08/29/2021 1352    Sepsis Labs: Lactic Acid, Venous    Component Value Date/Time   LATICACIDVEN 1.2 08/29/2021 1646    MICROBIOLOGY: Recent Results (from the past 240 hour(s))  Blood culture (routine single)     Status: None (Preliminary result)   Collection Time: 08/29/21  2:05 PM   Specimen: BLOOD  Result Value Ref Range Status   Specimen Description BLOOD RIGHT ANTECUBITAL  Final   Special Requests   Final    BOTTLES DRAWN AEROBIC AND ANAEROBIC Blood Culture adequate volume   Culture   Final    NO GROWTH 2 DAYS Performed at [facility], [address] [place], [place] [address]    Report Status PENDING  Incomplete  Resp Panel by RT-PCR (Flu A&B, Covid) Nasopharyngeal Swab     Status: Abnormal   Collection Time: 08/29/21  2:05 PM   Specimen: Nasopharyngeal Swab;  Nasopharyngeal(NP) swabs in vial transport medium  Result Value Ref Range Status   SARS Coronavirus 2 by RT PCR POSITIVE (A) NEGATIVE Final    Comment: RESULT CALLED TO, READ BACK BY AND VERIFIED WITH: RN E.BANKS AT 1609 ON 08/29/2021 BY T.SAAD. (NOTE) SARS-CoV-2 target nucleic acids are DETECTED.  The SARS-CoV-2 RNA is generally detectable in upper respiratory specimens during the acute phase of infection. Positive results are indicative of the presence of the identified virus, but do not rule out bacterial infection or co-infection with other pathogens not detected by the test. Clinical correlation with patient history and other diagnostic information is necessary to determine patient infection status. The expected result is Negative.  Fact Sheet for Patients: BloggerCourse.com  Fact Sheet for Healthcare Providers: SeriousBroker.it  This test is not yet approved or cleared by the [place] FDA and  has been authorized for detection and/or diagnosis of SARS-CoV-2 by FDA under an Emergency Use Authorization (EUA).  This EUA will remain in effect (meaning this t est can be used) for the duration of  the COVID-19 declaration under Section 564(b)(1) of the Act, 21 U.S.C. section 360bbb-3(b)(1), unless the authorization is terminated or revoked sooner.     Influenza A by PCR NEGATIVE NEGATIVE Final   Influenza B by PCR NEGATIVE NEGATIVE Final    Comment: (NOTE) The Xpert Xpress SARS-CoV-2/FLU/RSV plus assay is intended as an aid in the diagnosis of influenza from Nasopharyngeal swab specimens and should not be used as a sole basis for treatment. Nasal washings and aspirates are unacceptable for Xpert Xpress SARS-CoV-2/FLU/RSV testing.  Fact Sheet for Patients: BloggerCourse.com  Fact Sheet for Healthcare Providers: SeriousBroker.it  This test is not yet approved or cleared  by the [place] FDA and has been authorized for detection and/or diagnosis of SARS-CoV-2 by FDA under an Emergency Use Authorization (EUA). This EUA will remain in effect (meaning this test can be used) for the duration of the COVID-19 declaration under Section 564(b)(1) of the Act, 21 U.S.C. section 360bbb-3(b)(1), unless the authorization is terminated or revoked.  Performed at [facility], [address] [place], [place] [address]   Urine Culture     Status: Abnormal   Collection Time:  08/29/21  2:20 PM   Specimen: In/Out Cath Urine  Result Value Ref Range Status   Specimen Description IN/OUT CATH URINE  Final   Special Requests   Final    NONE Performed at [facility], [address], [place], [place] [address]    Culture MULTIPLE SPECIES PRESENT, SUGGEST RECOLLECTION (A)  Final   Report Status 08/30/2021 FINAL  Final    RADIOLOGY STUDIES/RESULTS: CT HEAD WO CONTRAST ( )  Result Date: 08/29/2021 CLINICAL DATA:  Headaches EXAM: CT HEAD WITHOUT CONTRAST TECHNIQUE: Contiguous axial images were obtained from the base of the skull through the vertex without intravenous contrast. COMPARISON:  None. FINDINGS: Brain: No evidence of acute infarction, hemorrhage, extra-axial collection, ventriculomegaly, or mass effect. Generalized cerebral atrophy. Periventricular white matter low attenuation likely secondary to microangiopathy. Vascular: Cerebrovascular atherosclerotic calcifications are noted. Skull: Negative for fracture or focal lesion. Sinuses/Orbits: Visualized portions of the orbits are unremarkable. Visualized portions of the paranasal sinuses are unremarkable. Visualized portions of the mastoid air cells are unremarkable. Other: None. IMPRESSION: No acute intracranial pathology. Electronically Signed   By: Elige Ko M.D.   On: 08/29/2021 17:01   CT Angio Chest PE W and/or Wo Contrast  Result Date: 08/29/2021 CLINICAL DATA:  Weakness, headache with nausea  vomiting and shortness of breath in an 87 year old female. EXAM: CT ANGIOGRAPHY CHEST CT ABDOMEN AND PELVIS WITH CONTRAST TECHNIQUE: Multidetector CT imaging of the chest was performed using the standard protocol during bolus administration of intravenous contrast. Multiplanar CT image reconstructions and MIPs were obtained to evaluate the vascular anatomy. Multidetector CT imaging of the abdomen and pelvis was performed using the standard protocol during bolus administration of intravenous contrast. CONTRAST:  38mL OMNIPAQUE IOHEXOL 350 MG/ML SOLN COMPARISON:  Clavicular x-rays of August 15, 2021. Abdomen pelvis CT from May 16, 2020. FINDINGS: CTA CHEST FINDINGS Cardiovascular: Calcified aortic atherosclerosis without aneurysmal dilation of the thoracic aorta. Calcification and soft plaque extending into branch vessels in the chest. Limited assessment due to bolus timing. Cardiac size with moderate to marked cardiac enlargement. Three-vessel coronary artery disease. No substantial pericardial effusion. Central pulmonary arteries with density of 620 Hounsfield units. Mildly limited assessment at the lung bases. No signs of pulmonary embolism to the segmental level at the lung bases or elsewhere in the chest. Mediastinum/Nodes: No signs of adenopathy in the chest. Esophagus grossly normal by CT. Lungs/Pleura: No pneumothorax, mild basilar atelectasis. Small layering bilateral effusions. Airways are patent. Musculoskeletal: Ununited RIGHT clavicular fracture shows a similar appearance to imaging from August 15, 2021. Chondroid lesion in the RIGHT proximal humerus. Osteopenia. Subacute rib fractures along the LEFT chest involving the third, fourth, fifth anterior ribs. Displaced subacute fractures of LEFT-sided ribs involving LEFT second, third and fourth ribs as well as the fifth rib without displacement. Spinal degenerative changes about the thoracic spine. Healed fractures of the sternum Review of the MIP  images confirms the above findings. CT ABDOMEN and PELVIS FINDINGS Hepatobiliary: Liver with smooth contours. No focal, suspicious hepatic lesion. Post cholecystectomy with patent portal vein. Biliary duct dilation, common bile duct approximately 9 mm. No substantial intrahepatic biliary duct distension. No perihepatic stranding or fluid. Pancreas: Signs of pancreatic atrophy. No visible inflammation or lesion. Atrophy more pronounced in the head of the pancreas. No ductal dilation. Spleen: Normal. Adrenals/Urinary Tract: Small renal lesion unchanged since 2021 arises from the lateral LEFT kidney measuring approximately 11 mm and 60 Hounsfield units. Numerous bladder diverticuli.  No perivesical stranding. No hydronephrosis. No suspicious  renal lesion on the RIGHT. Stomach/Bowel: No acute gastrointestinal process. Colonic diverticulosis. Appendix not visualized. No pericecal inflammation. Cecum displays a position that oriented more towards the upper abdomen. Maximal caliber approximately 7 cm. No pericecal stranding. No twist in the colon. Mild distortion of the mesentery without frank twisting. Approximally 180 degree rotation of distal small bowel mesentery. Vascular/Lymphatic: Aortic atherosclerosis. No sign of aneurysm. Smooth contour of the IVC. There is no gastrohepatic or hepatoduodenal ligament lymphadenopathy. No retroperitoneal or mesenteric lymphadenopathy. No pelvic sidewall lymphadenopathy. .2.5 cm maximal caliber of the infrarenal abdominal aorta is unchanged since previous imaging. Reproductive: Post hysterectomy, no adnexal mass. Other: No free air.  No sign of ascites. Musculoskeletal: Osteopenia. Spinal degenerative changes. Interval near complete loss of height of the L2 vertebral body since the prior CT study from May 16, 2020, surrounding degenerative changes implant chronicity, in fact this was shown to be fractured on January 27, 2021 but shows further loss of height since that time  previously approximately 50% loss of height now approximately 80% loss of height. Bony retropulsion at this level has increased since previous imaging, proximally 6-7 mm bony retropulsion at the level of the L2 fracture. Interval worsening loss of height is age indeterminate and there is moderate central canal narrowing associated with this finding. Review of the MIP images confirms the above findings. IMPRESSION: No signs of pulmonary embolism limited at the subsegmental level in the lung bases. Small layering bilateral effusions. Moderate to marked cardiac enlargement. Three-vessel coronary artery disease. Worsening loss of height of the L2 vertebral level with substantial surrounding degenerative change. Now with approximately 80% loss of height and moderate canal narrowing due to retropulsion of posterior cortical elements. Correlate with any acute pain in this area with further imaging as warranted. Small LEFT renal lesion unchanged since studies from 2021, potentially hemorrhagic or proteinaceous cyst, small indolent renal neoplasm is possible, f/u renal sonogram is suggested when the patient is able. Mild distension of the cecum with mid abdominal position without signs of twist of the bowel. No frank mesenteric twist. This could be due to a mobile cecum which can have an association with cecal volvulus, no current evidence. If there is worsening of abdominal pain could consider repeat imaging as warranted. Numerous bladder diverticuli. Ununited RIGHT clavicular fracture and subacute bilateral rib fractures. Electronically Signed   By: Donzetta Kohut M.D.   On: 08/29/2021 17:34   CT ABDOMEN PELVIS W CONTRAST  Result Date: 08/29/2021 CLINICAL DATA:  Weakness, headache with nausea vomiting and shortness of breath in an 87 year old female. EXAM: CT ANGIOGRAPHY CHEST CT ABDOMEN AND PELVIS WITH CONTRAST TECHNIQUE: Multidetector CT imaging of the chest was performed using the standard protocol during bolus  administration of intravenous contrast. Multiplanar CT image reconstructions and MIPs were obtained to evaluate the vascular anatomy. Multidetector CT imaging of the abdomen and pelvis was performed using the standard protocol during bolus administration of intravenous contrast. CONTRAST:  71mL OMNIPAQUE IOHEXOL 350 MG/ML SOLN COMPARISON:  Clavicular x-rays of August 15, 2021. Abdomen pelvis CT from May 16, 2020. FINDINGS: CTA CHEST FINDINGS Cardiovascular: Calcified aortic atherosclerosis without aneurysmal dilation of the thoracic aorta. Calcification and soft plaque extending into branch vessels in the chest. Limited assessment due to bolus timing. Cardiac size with moderate to marked cardiac enlargement. Three-vessel coronary artery disease. No substantial pericardial effusion. Central pulmonary arteries with density of 620 Hounsfield units. Mildly limited assessment at the lung bases. No signs of pulmonary embolism to the segmental level at the  lung bases or elsewhere in the chest. Mediastinum/Nodes: No signs of adenopathy in the chest. Esophagus grossly normal by CT. Lungs/Pleura: No pneumothorax, mild basilar atelectasis. Small layering bilateral effusions. Airways are patent. Musculoskeletal: Ununited RIGHT clavicular fracture shows a similar appearance to imaging from August 15, 2021. Chondroid lesion in the RIGHT proximal humerus. Osteopenia. Subacute rib fractures along the LEFT chest involving the third, fourth, fifth anterior ribs. Displaced subacute fractures of LEFT-sided ribs involving LEFT second, third and fourth ribs as well as the fifth rib without displacement. Spinal degenerative changes about the thoracic spine. Healed fractures of the sternum Review of the MIP images confirms the above findings. CT ABDOMEN and PELVIS FINDINGS Hepatobiliary: Liver with smooth contours. No focal, suspicious hepatic lesion. Post cholecystectomy with patent portal vein. Biliary duct dilation, common bile  duct approximately 9 mm. No substantial intrahepatic biliary duct distension. No perihepatic stranding or fluid. Pancreas: Signs of pancreatic atrophy. No visible inflammation or lesion. Atrophy more pronounced in the head of the pancreas. No ductal dilation. Spleen: Normal. Adrenals/Urinary Tract: Small renal lesion unchanged since 2021 arises from the lateral LEFT kidney measuring approximately 11 mm and 60 Hounsfield units. Numerous bladder diverticuli.  No perivesical stranding. No hydronephrosis. No suspicious renal lesion on the RIGHT. Stomach/Bowel: No acute gastrointestinal process. Colonic diverticulosis. Appendix not visualized. No pericecal inflammation. Cecum displays a position that oriented more towards the upper abdomen. Maximal caliber approximately 7 cm. No pericecal stranding. No twist in the colon. Mild distortion of the mesentery without frank twisting. Approximally 180 degree rotation of distal small bowel mesentery. Vascular/Lymphatic: Aortic atherosclerosis. No sign of aneurysm. Smooth contour of the IVC. There is no gastrohepatic or hepatoduodenal ligament lymphadenopathy. No retroperitoneal or mesenteric lymphadenopathy. No pelvic sidewall lymphadenopathy. .2.5 cm maximal caliber of the infrarenal abdominal aorta is unchanged since previous imaging. Reproductive: Post hysterectomy, no adnexal mass. Other: No free air.  No sign of ascites. Musculoskeletal: Osteopenia. Spinal degenerative changes. Interval near complete loss of height of the L2 vertebral body since the prior CT study from May 16, 2020, surrounding degenerative changes implant chronicity, in fact this was shown to be fractured on January 27, 2021 but shows further loss of height since that time previously approximately 50% loss of height now approximately 80% loss of height. Bony retropulsion at this level has increased since previous imaging, proximally 6-7 mm bony retropulsion at the level of the L2 fracture. Interval  worsening loss of height is age indeterminate and there is moderate central canal narrowing associated with this finding. Review of the MIP images confirms the above findings. IMPRESSION: No signs of pulmonary embolism limited at the subsegmental level in the lung bases. Small layering bilateral effusions. Moderate to marked cardiac enlargement. Three-vessel coronary artery disease. Worsening loss of height of the L2 vertebral level with substantial surrounding degenerative change. Now with approximately 80% loss of height and moderate canal narrowing due to retropulsion of posterior cortical elements. Correlate with any acute pain in this area with further imaging as warranted. Small LEFT renal lesion unchanged since studies from 2021, potentially hemorrhagic or proteinaceous cyst, small indolent renal neoplasm is possible, f/u renal sonogram is suggested when the patient is able. Mild distension of the cecum with mid abdominal position without signs of twist of the bowel. No frank mesenteric twist. This could be due to a mobile cecum which can have an association with cecal volvulus, no current evidence. If there is worsening of abdominal pain could consider repeat imaging as warranted. Numerous bladder diverticuli.  Ununited RIGHT clavicular fracture and subacute bilateral rib fractures. Electronically Signed   By: Donzetta Kohut M.D.   On: 08/29/2021 17:34   DG Chest Port 1 View  Result Date: 08/29/2021 CLINICAL DATA:  Questionable sepsis EXAM: PORTABLE CHEST 1 VIEW COMPARISON:  01/14/2021 FINDINGS: Cardiomegaly. No confluent opacities, effusions or edema. No acute bony abnormality. IMPRESSION: Cardiomegaly.  No active disease. Electronically Signed   By: Charlett Nose M.D.   On: 08/29/2021 14:33     LOS: 1 day   Jeoffrey Massed, MD  [facility]    To contact the attending provider between 7A-7P or the covering provider during after hours 7P-7A, please log into the web site [email or web address] and  access using universal [place] password for that web site. If you do not have the password, please call the hospital operator.  08/31/2021, 1:45 PM

## 2021-08-31 NOTE — Evaluation (Signed)
Occupational Therapy Evaluation Patient Details Name: Cheryl Atkinson MRN: [id number] DOB: 1931-01-04 Today's Date: 08/31/2021   History of Present Illness Pt is an 87 y/o female admitted 9/28 secondary to increased weakness. Found to be Covid+. PMH includes HTN, a fib, COPD, CHF.   Clinical Impression   Pt admitted with the above diagnoses and presents with below problem list. Pt will benefit from continued acute OT to address the below listed deficits and maximize independence with basic ADLs prior to d/c to venue below. PTA pt needed assist of 1 person to complete UB/LB ADLs, bed mobility, and functional transfers. Pt able to sit EOB a few minutes this session with fair-poor sitting balance; posterior lean noted while pt attempting to reposition. Currently pt needs +2 assist to stand. Pt endorses h/o multiple falls at home "I always fall backwards."        Recommendations for follow up therapy are one component of a multi-disciplinary discharge planning process, led by the attending physician.  Recommendations may be updated based on patient status, additional functional criteria and insurance authorization.   Follow Up Recommendations  SNF;Supervision/Assistance - 24 hour (currently needs +2 assist for OOB transfers)    Equipment Recommendations  Other (comment) (defer to next venue. if pt/family declines SNF may need lift equipment for home d/c)    Recommendations for Other Services       Precautions / Restrictions Precautions Precautions: Fall Restrictions Weight Bearing Restrictions: No Other Position/Activity Restrictions: pt with prefab R wrist splint, reports from injury sustained during fall at home. Pt denies any current restrictions to RUE (ie weight-bearing).      Mobility Bed Mobility Overal bed mobility: Needs Assistance Bed Mobility: Supine to Sit;Sit to Supine     Supine to sit: Max assist Sit to supine: Max assist   General bed mobility comments: assist  with all aspects    Transfers                 General transfer comment: pt with poor sitting balance. will need +2 assist to safely attempt standing.    Balance Overall balance assessment: Needs assistance Sitting-balance support: Bilateral upper extremity supported;Feet supported Sitting balance-Leahy Scale: Poor                                     ADL either performed or assessed with clinical judgement   ADL Overall ADL's : Needs assistance/impaired Eating/Feeding: Minimal assistance   Grooming: Moderate assistance   Upper Body Bathing: Moderate assistance;Sitting   Lower Body Bathing: Maximal assistance;Bed level;Sitting/lateral leans   Upper Body Dressing : Maximal assistance;Sitting   Lower Body Dressing: Maximal assistance;Bed level;Sitting/lateral leans                 General ADL Comments: BUE support and up to min A in static sitting. Intermittent left lateral and posterior lean. +2 to safely attempt standing d/t posterior lean. Pt endorses h/o falls at home resulting in some injuries "I always fall backwards."     Vision         Perception     Praxis      Pertinent Vitals/Pain Pain Assessment: Faces Faces Pain Scale: Hurts a little bit Pain Location: R hip Pain Descriptors / Indicators: Aching;Sore Pain Intervention(s): Monitored during session;Limited activity within patient's tolerance;Repositioned     Hand Dominance Right   Extremity/Trunk Assessment Upper Extremity Assessment Upper Extremity Assessment: Generalized weakness;RUE deficits/detail  RUE Deficits / Details: R hand in splint   Lower Extremity Assessment Lower Extremity Assessment: Defer to PT evaluation;Generalized weakness   Cervical / Trunk Assessment Cervical / Trunk Assessment: Kyphotic   Communication Communication Communication: HOH   Cognition Arousal/Alertness: Awake/alert Behavior During Therapy: Flat affect Overall Cognitive Status: No  family/caregiver present to determine baseline cognitive functioning                                 General Comments: "Did I have COVID?"   General Comments  No family present    Exercises     Shoulder Instructions      Home Living Family/patient expects to be discharged to:: Private residence Living Arrangements: Children Available Help at Discharge: Family;Personal care attendant;Available 24 hours/day Type of Home: House Home Access: Level entry     Home Layout: One level     Bathroom Shower/Tub: Producer, television/film/video: Handicapped height     Home Equipment: Environmental consultant - 2 wheels;Walker - 4 wheels;Bedside commode;Shower seat;Grab bars - toilet;Grab bars - tub/shower;Wheelchair - manual   Additional Comments: Aide is there when son is not there      Prior Functioning/Environment Level of Independence: Needs assistance  Gait / Transfers Assistance Needed: Uses RW for ambulation but needs assist. Uses WC for mobility as well. +1 assist at baseline. ADL's / Homemaking Assistance Needed: Aide assists with ADLs.   Comments: pt endorses needing assist with bed mobility at baseline.        OT Problem List: Decreased strength;Decreased activity tolerance;Impaired balance (sitting and/or standing);Decreased knowledge of use of DME or AE;Decreased safety awareness;Decreased knowledge of precautions;Pain      OT Treatment/Interventions: Self-care/ADL training;Therapeutic exercise;Energy conservation;DME and/or AE instruction;Therapeutic activities;Patient/family education;Balance training    OT Goals(Current goals can be found in the care plan section) Acute Rehab OT Goals Patient Stated Goal: to go home OT Goal Formulation: With patient Time For Goal Achievement: 09/14/21 Potential to Achieve Goals: Good ADL Goals Pt Will Perform Upper Body Dressing: with mod assist;sitting Pt Will Perform Lower Body Dressing: with mod assist;sit to/from stand Pt  Will Transfer to Toilet: with mod assist;ambulating;bedside commode Pt Will Perform Toileting - Clothing Manipulation and hygiene: with mod assist;sit to/from stand Additional ADL Goal #1: Pt will complete bed mobility at mod A level to prepare for EOB/OOB ADLs  OT Frequency: Min 2X/week   Barriers to D/C: Decreased caregiver support  currently needs +2 assist for OOB transfers, only has +1 assist at home.       Co-evaluation              AM-PAC OT "6 Clicks" Daily Activity     Outcome Measure Help from another person eating meals?: A Little Help from another person taking care of personal grooming?: A Lot Help from another person toileting, which includes using toliet, bedpan, or urinal?: Total Help from another person bathing (including washing, rinsing, drying)?: Total Help from another person to put on and taking off regular upper body clothing?: A Lot Help from another person to put on and taking off regular lower body clothing?: Total 6 Click Score: 10   End of Session    Activity Tolerance: Patient limited by fatigue Patient left: in bed;with call bell/phone within reach;with bed alarm set  OT Visit Diagnosis: Unsteadiness on feet (R26.81);Muscle weakness (generalized) (M62.81);Pain;Other symptoms and signs involving cognitive function;History of falling (Z91.81);Repeated falls (R29.6)  Time: 1027-2536 OT Time Calculation (min): 22 min Charges:  OT General Charges $OT Visit: 1 Visit OT Evaluation $OT Eval Moderate Complexity: 1 Mod  Raynald Kemp, OT Acute Rehabilitation Services Pager: [phone] Office: [phone]   Pilar Grammes 08/31/2021, 1:59 PM

## 2021-08-31 NOTE — [place] FL2 (Signed)
[place] MEDICAID FL2 LEVEL OF CARE SCREENING TOOL     IDENTIFICATION  Patient Name: Cheryl Atkinson Birthdate: October 09, 1931 Sex: female Admission Date (Current Location): 08/29/2021  [facility] and [place] Number:  Producer, television/film/video and Address:  [facility], [address] [place], [place] [address]      Provider Number: [id number]  Attending Physician Name and Address:  [name], MD  Relative Name and Phone Number:       Current Level of Care: Hospital Recommended Level of Care: Skilled Nursing Facility Prior Approval Number:    Date Approved/Denied:   PASRR Number: [id number]  Discharge Plan: SNF    Current Diagnoses: Patient Active Problem List   Diagnosis Date Noted   COVID-19 08/30/2021   COVID-19 virus infection 08/29/2021   Renal lesion 08/29/2021   B12 deficiency 02/27/2021   Pleural effusion due to CHF (congestive heart failure) (HCC) 05/26/2020   S/P laparoscopic cholecystectomy 04/04/2020   Heart failure with preserved ejection fraction (HCC) 03/01/2020   Insomnia 03/01/2020   Bacteremia 01/28/2020   Atrial fibrillation (HCC) 01/25/2020   Abdominal aortic ectasia (HCC) 12/29/2019   Major depression 03/25/2018   Hypokalemia 12/21/2016   Cat bite of right hand 12/21/2016   BPPV (benign paroxysmal positional vertigo) 07/12/2016   Memory loss 04/11/2016   Mallet toe of right foot 10/20/2015   Renal artery stenosis (HCC) 09/27/2015   Hyperlipidemia 06/30/2015   Claudication (HCC) 05/04/2015   Atherosclerotic PVD with intermittent claudication (HCC) 04/26/2015   Tinnitus 12/31/2014   Former smoker 09/29/2014   DNR (do not resuscitate) 09/29/2014   Hyperglycemia 07/19/2014   Multinodular goiter 08/30/2013   Spinal stenosis of lumbar region at multiple levels 09/29/2012   HIP PAIN, BILATERAL 07/16/2010   COPD (chronic obstructive pulmonary disease) (HCC) 09/20/2009   CONSTIPATION, CHRONIC 09/20/2009   Iron  deficiency anemia 11/09/2007   History of UTI 11/09/2007   RESTLESS LEG SYNDROME 09/25/2007   Essential hypertension 09/25/2007   GERD 09/25/2007   LOW BACK PAIN 09/25/2007   Osteoporosis 09/25/2007    Orientation RESPIRATION BLADDER Height & Weight     Self, Time, Situation, Place  Normal Incontinent, External catheter Weight: 130 lb 1.1 oz (59 kg) Height:  5\' 7"  (170.2 cm)  BEHAVIORAL SYMPTOMS/MOOD NEUROLOGICAL BOWEL NUTRITION STATUS      Incontinent Diet (Please See DC Summary)  AMBULATORY STATUS COMMUNICATION OF NEEDS Skin   Limited Assist Verbally Normal                       Personal Care Assistance Level of Assistance  Bathing, Feeding, Dressing Bathing Assistance: Limited assistance (Moderate Assistance) Feeding assistance: Limited assistance Dressing Assistance: Limited assistance     Functional Limitations Info  Sight, Hearing, Speech Sight Info: Impaired Hearing Info: Adequate Speech Info: Adequate    SPECIAL CARE FACTORS FREQUENCY  PT (By licensed PT), OT (By licensed OT)     PT Frequency: 5x/week OT Frequency: 5x/week            Contractures Contractures Info: Not present    Additional Factors Info  Code Status, Allergies Code Status Info: DNR Allergies Info: Codeine           Current Medications (08/31/2021):  This is the current hospital active medication list Current Facility-Administered Medications  Medication Dose Route Frequency Provider Last Rate Last Admin   acetaminophen (TYLENOL) tablet 650 mg  650 mg Oral Q6H PRN 09/02/2021, MD   [phone]  mg at 08/31/21 0850   albuterol (VENTOLIN HFA) 108 (90 Base) MCG/ACT inhaler 2 puff  2 puff Inhalation Q6H PRN Orland Mustard, MD       apixaban Everlene Balls) tablet 2.5 mg  2.5 mg Oral BID Ghimire, Werner Lean, MD       ascorbic acid (VITAMIN C) tablet 500 mg  500 mg Oral Daily Orland Mustard, MD   500 mg at 08/31/21 0850   carbidopa-levodopa (SINEMET IR) 25-100 MG per tablet immediate release 1  tablet  1 tablet Oral Daily PRN [name], MD       ferrous sulfate tablet 325 mg  325 mg Oral Once per day on Mon Thu Wolfe, Allison, MD   325 mg at 08/30/21 1275   furosemide (LASIX) tablet 40 mg  40 mg Oral Daily [name], MD   40 mg at 08/31/21 0850   HYDROmorphone (DILAUDID) injection 0.5 mg  0.5 mg Intravenous Q3H PRN Chotiner, Claudean Severance, MD   0.5 mg at 08/31/21 1339   irbesartan (AVAPRO) tablet 300 mg  300 mg Oral Daily Orland Mustard, MD   300 mg at 08/31/21 0850   molnupiravir EUA (LAGEVRIO) capsule 800 mg  4 capsule Oral BID Orland Mustard, MD   800 mg at 08/31/21 0856   ondansetron (ZOFRAN) tablet 4 mg  4 mg Oral Q6H PRN Orland Mustard, MD       Or   ondansetron [facility]) injection 4 mg  4 mg Intravenous Q6H PRN Orland Mustard, MD       polyethylene glycol (MIRALAX / GLYCOLAX) packet 17 g  17 g Oral Daily PRN Orland Mustard, MD       polyvinyl alcohol (LIQUIFILM TEARS) 1.4 % ophthalmic solution 1 drop  1 drop Both Eyes PRN Ghimire, Werner Lean, MD       rosuvastatin (CRESTOR) tablet 40 mg  40 mg Oral Daily Orland Mustard, MD   40 mg at 08/31/21 1700   Rotigotine PT24 3 mg  3 mg Transdermal QHS [name], MD       traZODone (DESYREL) tablet 75 mg  75 mg Oral QHS PRN Orland Mustard, MD       zinc sulfate capsule 220 mg  220 mg Oral Daily Orland Mustard, MD   220 mg at 08/31/21 1749     Discharge Medications: Please see discharge summary for a list of discharge medications.  Relevant Imaging Results:  Relevant Lab Results:   Additional Information SSN# [id number] COVID+ 08/29/21 Pfizer COVID-19 Vaccine 03/29/2020 , 03/04/2020  Rever Pichette, LCSWA

## 2021-08-31 NOTE — Progress Notes (Signed)
[facility] night shift telemetry coverage note.  The nursing staff reported that the patient is having right hip pain rated at 8 out of 10.  Her current blood pressure is 167/98 mmHg.  She is asking for his home tramadol 50 mg every 6 hours as needed.  This was ordered for 2 doses every 6 hours for as needed overnight use.  The primary team will address further pain concerns in the morning.  Sanda Klein, MD.

## 2021-09-01 LAB — CBC WITH DIFFERENTIAL/PLATELET
Abs Immature Granulocytes: 0.03 10*3/uL (ref 0.00–0.07)
Basophils Absolute: 0 10*3/uL (ref 0.0–0.1)
Basophils Relative: 0 %
Eosinophils Absolute: 0 10*3/uL (ref 0.0–0.5)
Eosinophils Relative: 0 %
HCT: 33.2 % — ABNORMAL LOW (ref 36.0–46.0)
Hemoglobin: 10.8 g/dL — ABNORMAL LOW (ref 12.0–15.0)
Immature Granulocytes: 1 %
Lymphocytes Relative: 26 %
Lymphs Abs: 0.8 10*3/uL (ref 0.7–4.0)
MCH: 28.6 pg (ref 26.0–34.0)
MCHC: 32.5 g/dL (ref 30.0–36.0)
MCV: 88.1 fL (ref 80.0–100.0)
Monocytes Absolute: 0.4 10*3/uL (ref 0.1–1.0)
Monocytes Relative: 14 %
Neutro Abs: 1.8 10*3/uL (ref 1.7–7.7)
Neutrophils Relative %: 59 %
Platelets: 127 10*3/uL — ABNORMAL LOW (ref 150–400)
RBC: 3.77 MIL/uL — ABNORMAL LOW (ref 3.87–5.11)
RDW: 15.8 % — ABNORMAL HIGH (ref 11.5–15.5)
WBC: 3.1 10*3/uL — ABNORMAL LOW (ref 4.0–10.5)
nRBC: 0 % (ref 0.0–0.2)

## 2021-09-01 LAB — COMPREHENSIVE METABOLIC PANEL
ALT: 12 U/L (ref 0–44)
AST: 26 U/L (ref 15–41)
Albumin: 2.9 g/dL — ABNORMAL LOW (ref 3.5–5.0)
Alkaline Phosphatase: 105 U/L (ref 38–126)
Anion gap: 11 (ref 5–15)
BUN: 16 mg/dL (ref 8–23)
CO2: 26 mmol/L (ref 22–32)
Calcium: 7.9 mg/dL — ABNORMAL LOW (ref 8.9–10.3)
Chloride: 97 mmol/L — ABNORMAL LOW (ref 98–111)
Creatinine, Ser: 0.94 mg/dL (ref 0.44–1.00)
GFR, Estimated: 58 mL/min — ABNORMAL LOW (ref >60)
Glucose, Bld: 104 mg/dL — ABNORMAL HIGH (ref 70–99)
Potassium: 2.8 mmol/L — ABNORMAL LOW (ref 3.5–5.1)
Sodium: 134 mmol/L — ABNORMAL LOW (ref 135–145)
Total Bilirubin: 0.7 mg/dL (ref 0.3–1.2)
Total Protein: 6.6 g/dL (ref 6.5–8.1)

## 2021-09-01 LAB — MAGNESIUM: Magnesium: 1.9 mg/dL (ref 1.7–2.4)

## 2021-09-01 MED ORDER — POTASSIUM CHLORIDE 10 MEQ/100ML IV SOLN
INTRAVENOUS | Status: AC
  Administered 2021-09-01: 10 meq via INTRAVENOUS
  Filled 2021-09-01: qty 100

## 2021-09-01 MED ORDER — POTASSIUM CHLORIDE 10 MEQ/100ML IV SOLN
INTRAVENOUS | Status: AC
  Filled 2021-09-01: qty 100

## 2021-09-01 MED ORDER — HYDROCODONE-ACETAMINOPHEN 5-325 MG PO TABS: 1.0000 | ORAL_TABLET | Freq: Four times a day (QID) | ORAL | Status: DC | PRN

## 2021-09-01 MED ORDER — POTASSIUM CHLORIDE 10 MEQ/100ML IV SOLN
10.0000 meq | INTRAVENOUS | Status: AC
Start: 2021-09-01 — End: 2021-09-01
  Administered 2021-09-01 (×3): 10 meq via INTRAVENOUS
  Filled 2021-09-01 (×2): qty 100

## 2021-09-01 MED ORDER — POTASSIUM CHLORIDE CRYS ER 20 MEQ PO TBCR: 40.0000 meq | EXTENDED_RELEASE_TABLET | Freq: Every day | ORAL | Status: DC

## 2021-09-01 NOTE — Progress Notes (Signed)
PROGRESS NOTE        PATIENT DETAILS Name: Cheryl Atkinson Age: 87 y.o. Sex: female Date of Birth: 03/27/1931 Admit Date: 08/29/2021 Admitting Physician Dewayne Shorter Levora Dredge, MD [id number], [name], MD  Brief Narrative: Patient is a 87 y.o. female with history of HTN, HLD, HFpEF, RLS, PAF, COPD-who presents with 2-day history of poor oral intake/intermittent nausea-and generalized weakness-she was found to be febrile on initial presentation-and subsequent work-up revealed COVID-19 infection.  See below for further details.  Subjective: Feels much better-much more awake and alert compared to yesterday.  No specific complaints.  Denies any chest pain or shortness of breath.  Objective: Vitals: Blood pressure (!) 173/85, pulse 79, temperature 98.4 F (36.9 C), temperature source Oral, resp. rate 14, height 5\' 7"  (1.702 m), weight 58.7 kg, SpO2 98 %.   Exam: Gen Exam: Chronically sick appearing-not in any distress. HEENT:atraumatic, normocephalic Chest: B/L clear to auscultation anteriorly CVS:S1S2 regular Abdomen:soft non tender, non distended Extremities:no edema Neurology: Non focal-generalized weakness. Skin: no rash   Pertinent Labs/Radiology: WBC: 3.1 Hb: 10.8  PLT: 127 Na: 134 K: 2.8 Creatinine: 0.94  Mg: 1.9  9/28>>Blood culture: No growth 9/28>>Urine Culture: Multiple species   9/28>> CT head: No acute intracranial pathology 9/28>> CT angio chest: No PE.  Ununited right clavicular fracture/subacute bilateral rib fractures. 9/28>> CT abdomen/pelvis: Left renal lesion unchanged since 2021-hemorrhagic/proteinaceous cyst, mild distention of the cecum  Assessment/Plan: COVID-19 infection: Stable on room air-no PNA on imaging--continues to have generalized weakness that is slowly improving.  Remains on molnuparivir.   Generalized weakness/debility: Due to above-evaluated by PT/OT-recommendations of SNF on discharge.    Pancytopenia: Likely  due to COVID-19 infection-mild-follow periodically.  Hypokalemia: Continue to replete and recheck.  Likely due to Lasix.  Hypomagnesemia: Repleted  COPD: Stable-continue bronchodilators  HFpEF: Euvolemic-continue Lasix at daily dosing-change back to twice daily when more stable.   HTN: Controlled-continue Avapro and Lasix  HLD: Continue statin  Chronic atrial fibrillation: Rate controlled-on Eliquis  RLS: Continue transdermal Neupro-and as needed Sinemet.  Left renal lesion: Chronic issue-we will need outpatient renal ultrasound-we will defer to PCP  Distended cecum on CT scan: Unclear clinical significance-abdominal exam is benign.  Right little finger fracture: Splint in place-resume outpatient follow-up with orthopedics as previous.  Procedures: None Consults: None DVT Prophylaxis: Eliquis Code Status: DNR Family Communication: Sister-Brenda-[phone] updated over the phone on 9/30  Time spent: 25 minutes-Greater than 50% of this time was spent in counseling, explanation of diagnosis, planning of further management, and coordination of care.  Diet: Diet Order             Diet heart healthy/carb modified Room service appropriate? No; Fluid consistency: Thin  Diet effective now                      Disposition Plan: Status is: Observation  The patient will require care spanning > 2 midnights and should be moved to inpatient because: Inpatient level of care appropriate due to severity of illness  Dispo: The patient is from: Home              Anticipated d/c is to: SNF vs HHPT              Patient currently is not medically stable to d/c.   Difficult to place patient No  Barriers to Discharge: COVID-19 infection with severe weakness/debility-needs PT/OT evaluation to determine appropriate disposition.  Antimicrobial agents: Anti-infectives (From admission, onward)    Start     Dose/Rate Route Frequency Ordered Stop   08/29/21 2200  molnupiravir  EUA (LAGEVRIO) capsule 800 mg        4 capsule Oral 2 times daily 08/29/21 1947 09/03/21 2159   08/29/21 1600  cefTRIAXone (ROCEPHIN) 2 g in sodium chloride 0.9 % 100 mL IVPB        2 g 200 mL/hr over 30 Minutes Intravenous  Once 08/29/21 1554 08/29/21 1757   08/29/21 1600  azithromycin (ZITHROMAX) 500 mg in sodium chloride 0.9 % 250 mL IVPB        500 mg 250 mL/hr over 60 Minutes Intravenous  Once 08/29/21 1554 08/29/21 1757        MEDICATIONS: Scheduled Meds:  apixaban  2.5 mg Oral BID   vitamin C  500 mg Oral Daily   ferrous sulfate  325 mg Oral Once per day on Mon Thu   furosemide  40 mg Oral Daily   irbesartan  300 mg Oral Daily   molnupiravir EUA  4 capsule Oral BID   [START ON 09/02/2021] potassium chloride  40 mEq Oral Daily   rosuvastatin  40 mg Oral Daily   Rotigotine  3 mg Transdermal QHS   zinc sulfate  220 mg Oral Daily   Continuous Infusions:  potassium chloride 10 mEq (09/01/21 0843)   PRN Meds:.acetaminophen, albuterol, carbidopa-levodopa, ondansetron **OR** ondansetron (ZOFRAN) IV, polyethylene glycol, polyvinyl alcohol, traZODone   I have personally reviewed following labs and imaging studies  LABORATORY DATA: CBC: Recent Labs  Lab 08/29/21 1405 08/30/21 0308 08/31/21 0118 09/01/21 0038  WBC 6.0 3.1* 4.2 3.1*  NEUTROABS 5.0 2.1 3.0 1.8  HGB 10.1* 8.8* 11.4* 10.8*  HCT 32.1* 28.3* 35.5* 33.2*  MCV 90.9 92.2 89.2 88.1  PLT 181 134* 144* 127*     Basic Metabolic Panel: Recent Labs  Lab 08/29/21 1405 08/29/21 2000 08/30/21 0308 08/31/21 0118 09/01/21 0038  NA 137  --  137 136 134*  K 2.8*  --  3.2* 3.6 2.8*  CL 101  --  104 98 97*  CO2 27  --  23 25 26   GLUCOSE 126*  --  91 101* 104*  BUN 13  --  9 10 16   CREATININE 0.88  --  0.70 0.84 0.94  CALCIUM 8.8*  --  8.0* 8.2* 7.9*  MG  --  1.6* 1.5* 2.2 1.9  PHOS  --   --  2.6  --   --      GFR: Estimated Creatinine Clearance: 38.3 mL/min (by C-G formula based on SCr of 0.94  mg/dL).  Liver Function Tests: Recent Labs  Lab 08/29/21 1405 08/30/21 0308 08/31/21 0118 09/01/21 0038  AST 19 17 24 26   ALT 12 13 17 12   ALKPHOS 112 96 116 105  BILITOT 0.6 0.8 0.9 0.7  PROT 7.0 5.7* 7.1 6.6  ALBUMIN 3.2* 2.8* 3.3* 2.9*    No results for input(s): LIPASE, AMYLASE in the last 168 hours. Recent Labs  Lab 08/29/21 1646  AMMONIA 11     Coagulation Profile: Recent Labs  Lab 08/29/21 1405  INR 1.4*     Cardiac Enzymes: No results for input(s): CKTOTAL, CKMB, CKMBINDEX, TROPONINI in the last 168 hours.  BNP (last 3 results) No results for input(s): PROBNP in the last 8760 hours.  Lipid Profile: No results for input(s): CHOL,  HDL, LDLCALC, TRIG, CHOLHDL, LDLDIRECT in the last 72 hours.  Thyroid Function Tests: No results for input(s): TSH, T4TOTAL, FREET4, T3FREE, THYROIDAB in the last 72 hours.  Anemia Panel: Recent Labs    08/29/21 2000 08/30/21 0308  FERRITIN 124 117     Urine analysis:    Component Value Date/Time   COLORURINE YELLOW 08/29/2021 1352   APPEARANCEUR CLEAR 08/29/2021 1352   LABSPEC 1.010 08/29/2021 1352   PHURINE 6.0 08/29/2021 1352   GLUCOSEU NEGATIVE 08/29/2021 1352   HGBUR NEGATIVE 08/29/2021 1352   BILIRUBINUR NEGATIVE 08/29/2021 1352   BILIRUBINUR Negative 02/15/2020 1052   KETONESUR NEGATIVE 08/29/2021 1352   PROTEINUR 30 (A) 08/29/2021 1352   UROBILINOGEN 0.2 02/15/2020 1052   UROBILINOGEN 0.2 08/20/2019 1732   NITRITE NEGATIVE 08/29/2021 1352   LEUKOCYTESUR NEGATIVE 08/29/2021 1352    Sepsis Labs: Lactic Acid, Venous    Component Value Date/Time   LATICACIDVEN 1.2 08/29/2021 1646    MICROBIOLOGY: Recent Results (from the past 240 hour(s))  Blood culture (routine single)     Status: None (Preliminary result)   Collection Time: 08/29/21  2:05 PM   Specimen: BLOOD  Result Value Ref Range Status   Specimen Description BLOOD RIGHT ANTECUBITAL  Final   Special Requests   Final    BOTTLES DRAWN  AEROBIC AND ANAEROBIC Blood Culture adequate volume   Culture   Final    NO GROWTH 3 DAYS Performed at [facility], [address] [place], [place] [address]    Report Status PENDING  Incomplete  Resp Panel by RT-PCR (Flu A&B, Covid) Nasopharyngeal Swab     Status: Abnormal   Collection Time: 08/29/21  2:05 PM   Specimen: Nasopharyngeal Swab; Nasopharyngeal(NP) swabs in vial transport medium  Result Value Ref Range Status   SARS Coronavirus 2 by RT PCR POSITIVE (A) NEGATIVE Final    Comment: RESULT CALLED TO, READ BACK BY AND VERIFIED WITH: RN E.BANKS AT 1609 ON 08/29/2021 BY T.SAAD. (NOTE) SARS-CoV-2 target nucleic acids are DETECTED.  The SARS-CoV-2 RNA is generally detectable in upper respiratory specimens during the acute phase of infection. Positive results are indicative of the presence of the identified virus, but do not rule out bacterial infection or co-infection with other pathogens not detected by the test. Clinical correlation with patient history and other diagnostic information is necessary to determine patient infection status. The expected result is Negative.  Fact Sheet for Patients: BloggerCourse.com  Fact Sheet for Healthcare Providers: SeriousBroker.it  This test is not yet approved or cleared by the [place] FDA and  has been authorized for detection and/or diagnosis of SARS-CoV-2 by FDA under an Emergency Use Authorization (EUA).  This EUA will remain in effect (meaning this t est can be used) for the duration of  the COVID-19 declaration under Section 564(b)(1) of the Act, 21 U.S.C. section 360bbb-3(b)(1), unless the authorization is terminated or revoked sooner.     Influenza A by PCR NEGATIVE NEGATIVE Final   Influenza B by PCR NEGATIVE NEGATIVE Final    Comment: (NOTE) The Xpert Xpress SARS-CoV-2/FLU/RSV plus assay is intended as an aid in the diagnosis of influenza from Nasopharyngeal  swab specimens and should not be used as a sole basis for treatment. Nasal washings and aspirates are unacceptable for Xpert Xpress SARS-CoV-2/FLU/RSV testing.  Fact Sheet for Patients: BloggerCourse.com  Fact Sheet for Healthcare Providers: SeriousBroker.it  This test is not yet approved or cleared by the [place] FDA and has been authorized for detection  and/or diagnosis of SARS-CoV-2 by FDA under an Emergency Use Authorization (EUA). This EUA will remain in effect (meaning this test can be used) for the duration of the COVID-19 declaration under Section 564(b)(1) of the Act, 21 U.S.C. section 360bbb-3(b)(1), unless the authorization is terminated or revoked.  Performed at [facility], [address] [place], [place] [address]   Urine Culture     Status: Abnormal   Collection Time: 08/29/21  2:20 PM   Specimen: In/Out Cath Urine  Result Value Ref Range Status   Specimen Description IN/OUT CATH URINE  Final   Special Requests   Final    NONE Performed at [facility], [address], [place], [place] [address]    Culture MULTIPLE SPECIES PRESENT, SUGGEST RECOLLECTION (A)  Final   Report Status 08/30/2021 FINAL  Final    RADIOLOGY STUDIES/RESULTS: No results found.   LOS: 2 days   Jeoffrey Massed, MD  [facility]    To contact the attending provider between 7A-7P or the covering provider during after hours 7P-7A, please log into the web site [email or web address] and access using universal [place] password for that web site. If you do not have the password, please call the hospital operator.  09/01/2021, 10:28 AM

## 2021-09-01 NOTE — Progress Notes (Signed)
Rocky Link, RN placed IV in left hand. Patient also has IV in left AC. Both IV's flushed and are working with no signs of infiltration. Patient reported to nurse that she did not want to get stuck again at this time. No other IV needed.

## 2021-09-02 LAB — CBC WITH DIFFERENTIAL/PLATELET
Abs Immature Granulocytes: 0.01 10*3/uL (ref 0.00–0.07)
Basophils Absolute: 0 10*3/uL (ref 0.0–0.1)
Basophils Relative: 1 %
Eosinophils Absolute: 0 10*3/uL (ref 0.0–0.5)
Eosinophils Relative: 0 %
HCT: 32.7 % — ABNORMAL LOW (ref 36.0–46.0)
Hemoglobin: 10.5 g/dL — ABNORMAL LOW (ref 12.0–15.0)
Immature Granulocytes: 0 %
Lymphocytes Relative: 47 %
Lymphs Abs: 1.2 10*3/uL (ref 0.7–4.0)
MCH: 28.5 pg (ref 26.0–34.0)
MCHC: 32.1 g/dL (ref 30.0–36.0)
MCV: 88.6 fL (ref 80.0–100.0)
Monocytes Absolute: 0.3 10*3/uL (ref 0.1–1.0)
Monocytes Relative: 10 %
Neutro Abs: 1.1 10*3/uL — ABNORMAL LOW (ref 1.7–7.7)
Neutrophils Relative %: 42 %
Platelets: 132 10*3/uL — ABNORMAL LOW (ref 150–400)
RBC: 3.69 MIL/uL — ABNORMAL LOW (ref 3.87–5.11)
RDW: 15.5 % (ref 11.5–15.5)
WBC: 2.6 10*3/uL — ABNORMAL LOW (ref 4.0–10.5)
nRBC: 0 % (ref 0.0–0.2)

## 2021-09-02 LAB — COMPREHENSIVE METABOLIC PANEL
ALT: 6 U/L (ref 0–44)
AST: 30 U/L (ref 15–41)
Albumin: 2.8 g/dL — ABNORMAL LOW (ref 3.5–5.0)
Alkaline Phosphatase: 82 U/L (ref 38–126)
Anion gap: 13 (ref 5–15)
BUN: 19 mg/dL (ref 8–23)
CO2: 24 mmol/L (ref 22–32)
Calcium: 8.2 mg/dL — ABNORMAL LOW (ref 8.9–10.3)
Chloride: 100 mmol/L (ref 98–111)
Creatinine, Ser: 1.02 mg/dL — ABNORMAL HIGH (ref 0.44–1.00)
GFR, Estimated: 53 mL/min — ABNORMAL LOW (ref >60)
Glucose, Bld: 94 mg/dL (ref 70–99)
Potassium: 3.3 mmol/L — ABNORMAL LOW (ref 3.5–5.1)
Sodium: 137 mmol/L (ref 135–145)
Total Bilirubin: 0.5 mg/dL (ref 0.3–1.2)
Total Protein: 6.2 g/dL — ABNORMAL LOW (ref 6.5–8.1)

## 2021-09-02 LAB — MAGNESIUM: Magnesium: 1.8 mg/dL (ref 1.7–2.4)

## 2021-09-02 MED ORDER — POTASSIUM CHLORIDE CRYS ER 20 MEQ PO TBCR
40.0000 meq | EXTENDED_RELEASE_TABLET | Freq: Every day | ORAL | Status: DC
  Administered 2021-09-03 – 2021-09-07 (×5): 40 meq via ORAL
  Filled 2021-09-02 (×5): qty 2

## 2021-09-02 MED ORDER — POTASSIUM CHLORIDE CRYS ER 20 MEQ PO TBCR
40.0000 meq | EXTENDED_RELEASE_TABLET | ORAL | Status: AC
Start: 2021-09-02 — End: 2021-09-02
  Administered 2021-09-02 (×2): 40 meq via ORAL
  Filled 2021-09-02 (×2): qty 2

## 2021-09-02 MED ORDER — MAGNESIUM SULFATE 2 GM/50ML IV SOLN
2.0000 g | Freq: Once | INTRAVENOUS | Status: AC
  Administered 2021-09-02: 2 g via INTRAVENOUS
  Filled 2021-09-02: qty 50

## 2021-09-02 NOTE — Progress Notes (Signed)
PROGRESS NOTE        PATIENT DETAILS Name: Cheryl Atkinson Age: 87 y.o. Sex: female Date of Birth: [birth year] Admit Date: 08/29/2021 Admitting Physician Dewayne Shorter Levora Dredge, MD [id number], [name], MD  Brief Narrative: Patient is a 87 y.o. female with history of HTN, HLD, HFpEF, RLS, PAF, COPD-who presents with 2-day history of poor oral intake/intermittent nausea-and generalized weakness-she was found to be febrile on initial presentation-and subsequent work-up revealed COVID-19 infection.  See below for further details.  Subjective: Awake/alert-denies any chest pain or shortness of breath.  Acknowledges feeling better than the past few days.  Objective: Vitals: Blood pressure 138/83, pulse 66, temperature 97.9 F (36.6 C), temperature source Oral, resp. rate 18, height 5\' 7"  (1.702 m), weight 57.5 kg, SpO2 99 %.   Exam: Gen Exam:Alert awake-not in any distress HEENT:atraumatic, normocephalic Chest: B/L clear to auscultation anteriorly CVS:S1S2 regular Abdomen:soft non tender, non distended Extremities:no edema Neurology: Non focal Skin: no rash   Pertinent Labs/Radiology: WBC: 2.6 Hb: 10.5 PLT: 132 Na: 137 K: 3.3 Creatinine: 1.02 Mg: 1.8  9/28>>Blood culture: No growth 9/28>>Urine Culture: Multiple species   9/28>> CT head: No acute intracranial pathology 9/28>> CT angio chest: No PE.  Ununited right clavicular fracture/subacute bilateral rib fractures. 9/28>> CT abdomen/pelvis: Left renal lesion unchanged since 2021-hemorrhagic/proteinaceous cyst, mild distention of the cecum  Assessment/Plan: COVID-19 infection: Remains stable on room air-no clinical signs of pneumonia-generalized weakness slowly improving-remains on  molnuparivir.   Generalized weakness/debility: Due to above-evaluated by PT/OT-recommendations of SNF on discharge.    Pancytopenia: Likely due to COVID-19 infection-mild-follow periodically.  Hypokalemia: Likely due  to Lasix-continue to replete and recheck.  Hypomagnesemia: Replete and recheck.  COPD: Stable-continue bronchodilators  HFpEF: Euvolemic-continue Lasix at daily dosing-change back to twice daily when more stable.   HTN: Controlled-continue Avapro and Lasix  HLD: Continue statin  Chronic atrial fibrillation: Rate controlled-on Eliquis  RLS: Continue transdermal Neupro-and as needed Sinemet.  Left renal lesion: Chronic issue-we will need outpatient renal ultrasound-we will defer to PCP  Distended cecum on CT scan: Unclear clinical significance-abdominal exam is benign.  Right little finger fracture: Splint in place-resume outpatient follow-up with orthopedics as previous.  Procedures: None Consults: None DVT Prophylaxis: Eliquis Code Status: DNR Family Communication: Sister-Brenda-[phone] updated over the phone on 10/2  Time spent: 25 minutes-Greater than 50% of this time was spent in counseling, explanation of diagnosis, planning of further management, and coordination of care.  Diet: Diet Order             Diet heart healthy/carb modified Room service appropriate? No; Fluid consistency: Thin  Diet effective now                      Disposition Plan: Status is: Inpatient  The patient will require care spanning > 2 midnights and should be moved to inpatient because: Inpatient level of care appropriate due to severity of illness  Dispo: The patient is from: Home              Anticipated d/c is to: SNF vs HHPT              Patient currently is not medically stable to d/c.   Difficult to place patient No    Barriers to Discharge: COVID-19 infection with severe weakness/debility-needs SNF on discharge.  Antimicrobial agents: Anti-infectives (From  admission, onward)    Start     Dose/Rate Route Frequency Ordered Stop   08/29/21 2200  molnupiravir EUA (LAGEVRIO) capsule 800 mg        4 capsule Oral 2 times daily 08/29/21 1947 09/03/21 2159   08/29/21  1600  cefTRIAXone (ROCEPHIN) 2 g in sodium chloride 0.9 % 100 mL IVPB        2 g 200 mL/hr over 30 Minutes Intravenous  Once 08/29/21 1554 08/29/21 1757   08/29/21 1600  azithromycin (ZITHROMAX) 500 mg in sodium chloride 0.9 % 250 mL IVPB        500 mg 250 mL/hr over 60 Minutes Intravenous  Once 08/29/21 1554 08/29/21 1757        MEDICATIONS: Scheduled Meds:  apixaban  2.5 mg Oral BID   vitamin C  500 mg Oral Daily   ferrous sulfate  325 mg Oral Once per day on Mon Thu   furosemide  40 mg Oral Daily   irbesartan  300 mg Oral Daily   molnupiravir EUA  4 capsule Oral BID   [START ON 09/03/2021] potassium chloride  40 mEq Oral Daily   potassium chloride  40 mEq Oral Q4H   rosuvastatin  40 mg Oral Daily   Rotigotine  3 mg Transdermal QHS   zinc sulfate  220 mg Oral Daily   Continuous Infusions:  PRN Meds:.acetaminophen, albuterol, carbidopa-levodopa, HYDROcodone-acetaminophen, ondansetron **OR** ondansetron (ZOFRAN) IV, polyethylene glycol, polyvinyl alcohol, traZODone   I have personally reviewed following labs and imaging studies  LABORATORY DATA: CBC: Recent Labs  Lab 08/29/21 1405 08/30/21 0308 08/31/21 0118 09/01/21 0038 09/02/21 0128  WBC 6.0 3.1* 4.2 3.1* 2.6*  NEUTROABS 5.0 2.1 3.0 1.8 1.1*  HGB 10.1* 8.8* 11.4* 10.8* 10.5*  HCT 32.1* 28.3* 35.5* 33.2* 32.7*  MCV 90.9 92.2 89.2 88.1 88.6  PLT 181 134* 144* 127* 132*     Basic Metabolic Panel: Recent Labs  Lab 08/29/21 1405 08/29/21 2000 08/30/21 0308 08/31/21 0118 09/01/21 0038 09/02/21 0128  NA 137  --  137 136 134* 137  K 2.8*  --  3.2* 3.6 2.8* 3.3*  CL 101  --  104 98 97* 100  CO2 27  --  23 25 26 24   GLUCOSE 126*  --  91 101* 104* 94  BUN 13  --  9 10 16 19   CREATININE 0.88  --  0.70 0.84 0.94 1.02*  CALCIUM 8.8*  --  8.0* 8.2* 7.9* 8.2*  MG  --  1.6* 1.5* 2.2 1.9 1.8  PHOS  --   --  2.6  --   --   --      GFR: Estimated Creatinine Clearance: 34.6 mL/min (A) (by C-G formula based on SCr  of 1.02 mg/dL (H)).  Liver Function Tests: Recent Labs  Lab 08/29/21 1405 08/30/21 0308 08/31/21 0118 09/01/21 0038 09/02/21 0128  AST 19 17 24 26 30   ALT 12 13 17 12 6   ALKPHOS 112 96 116 105 82  BILITOT 0.6 0.8 0.9 0.7 0.5  PROT 7.0 5.7* 7.1 6.6 6.2*  ALBUMIN 3.2* 2.8* 3.3* 2.9* 2.8*    No results for input(s): LIPASE, AMYLASE in the last 168 hours. Recent Labs  Lab 08/29/21 1646  AMMONIA 11     Coagulation Profile: Recent Labs  Lab 08/29/21 1405  INR 1.4*     Cardiac Enzymes: No results for input(s): CKTOTAL, CKMB, CKMBINDEX, TROPONINI in the last 168 hours.  BNP (last 3 results) No results for  input(s): PROBNP in the last 8760 hours.  Lipid Profile: No results for input(s): CHOL, HDL, LDLCALC, TRIG, CHOLHDL, LDLDIRECT in the last 72 hours.  Thyroid Function Tests: No results for input(s): TSH, T4TOTAL, FREET4, T3FREE, THYROIDAB in the last 72 hours.  Anemia Panel: No results for input(s): VITAMINB12, FOLATE, FERRITIN, TIBC, IRON, RETICCTPCT in the last 72 hours.   Urine analysis:    Component Value Date/Time   COLORURINE YELLOW 08/29/2021 1352   APPEARANCEUR CLEAR 08/29/2021 1352   LABSPEC 1.010 08/29/2021 1352   PHURINE 6.0 08/29/2021 1352   GLUCOSEU NEGATIVE 08/29/2021 1352   HGBUR NEGATIVE 08/29/2021 1352   BILIRUBINUR NEGATIVE 08/29/2021 1352   BILIRUBINUR Negative 02/15/2020 1052   KETONESUR NEGATIVE 08/29/2021 1352   PROTEINUR 30 (A) 08/29/2021 1352   UROBILINOGEN 0.2 02/15/2020 1052   UROBILINOGEN 0.2 08/20/2019 1732   NITRITE NEGATIVE 08/29/2021 1352   LEUKOCYTESUR NEGATIVE 08/29/2021 1352    Sepsis Labs: Lactic Acid, Venous    Component Value Date/Time   LATICACIDVEN 1.2 08/29/2021 1646    MICROBIOLOGY: Recent Results (from the past 240 hour(s))  Blood culture (routine single)     Status: None (Preliminary result)   Collection Time: 08/29/21  2:05 PM   Specimen: BLOOD  Result Value Ref Range Status   Specimen Description  BLOOD RIGHT ANTECUBITAL  Final   Special Requests   Final    BOTTLES DRAWN AEROBIC AND ANAEROBIC Blood Culture adequate volume   Culture   Final    NO GROWTH 4 DAYS Performed at [facility], [address] [place], [place] [address]    Report Status PENDING  Incomplete  Resp Panel by RT-PCR (Flu A&B, Covid) Nasopharyngeal Swab     Status: Abnormal   Collection Time: 08/29/21  2:05 PM   Specimen: Nasopharyngeal Swab; Nasopharyngeal(NP) swabs in vial transport medium  Result Value Ref Range Status   SARS Coronavirus 2 by RT PCR POSITIVE (A) NEGATIVE Final    Comment: RESULT CALLED TO, READ BACK BY AND VERIFIED WITH: RN E.BANKS AT 1609 ON 08/29/2021 BY T.SAAD. (NOTE) SARS-CoV-2 target nucleic acids are DETECTED.  The SARS-CoV-2 RNA is generally detectable in upper respiratory specimens during the acute phase of infection. Positive results are indicative of the presence of the identified virus, but do not rule out bacterial infection or co-infection with other pathogens not detected by the test. Clinical correlation with patient history and other diagnostic information is necessary to determine patient infection status. The expected result is Negative.  Fact Sheet for Patients: BloggerCourse.com  Fact Sheet for Healthcare Providers: SeriousBroker.it  This test is not yet approved or cleared by the [place] FDA and  has been authorized for detection and/or diagnosis of SARS-CoV-2 by FDA under an Emergency Use Authorization (EUA).  This EUA will remain in effect (meaning this t est can be used) for the duration of  the COVID-19 declaration under Section 564(b)(1) of the Act, 21 U.S.C. section 360bbb-3(b)(1), unless the authorization is terminated or revoked sooner.     Influenza A by PCR NEGATIVE NEGATIVE Final   Influenza B by PCR NEGATIVE NEGATIVE Final    Comment: (NOTE) The Xpert Xpress SARS-CoV-2/FLU/RSV plus  assay is intended as an aid in the diagnosis of influenza from Nasopharyngeal swab specimens and should not be used as a sole basis for treatment. Nasal washings and aspirates are unacceptable for Xpert Xpress SARS-CoV-2/FLU/RSV testing.  Fact Sheet for Patients: BloggerCourse.com  Fact Sheet for Healthcare Providers: SeriousBroker.it  This test is not  yet approved or cleared by the [place] and has been authorized for detection and/or diagnosis of SARS-CoV-2 by FDA under an Emergency Use Authorization (EUA). This EUA will remain in effect (meaning this test can be used) for the duration of the COVID-19 declaration under Section 564(b)(1) of the Act, 21 U.S.C. section 360bbb-3(b)(1), unless the authorization is terminated or revoked.  Performed at [facility], [address] [place], [place] [address]   Urine Culture     Status: Abnormal   Collection Time: 08/29/21  2:20 PM   Specimen: In/Out Cath Urine  Result Value Ref Range Status   Specimen Description IN/OUT CATH URINE  Final   Special Requests   Final    NONE Performed at [facility], [address] [place], [place] [address]    Culture MULTIPLE SPECIES PRESENT, SUGGEST RECOLLECTION (A)  Final   Report Status 08/30/2021 FINAL  Final    RADIOLOGY STUDIES/RESULTS: No results found.   LOS: 3 days   Jeoffrey Massed, MD  [facility]    To contact the attending provider between 7A-7P or the covering provider during after hours 7P-7A, please log into the web site [email or web address] and access using universal [place] password for that web site. If you do not have the password, please call the hospital operator.  09/02/2021, 10:32 AM

## 2021-09-03 ENCOUNTER — Encounter (HOSPITAL_COMMUNITY): Payer: Self-pay | Admitting: Internal Medicine

## 2021-09-03 LAB — BASIC METABOLIC PANEL
Anion gap: 10 (ref 5–15)
BUN: 24 mg/dL — ABNORMAL HIGH (ref 8–23)
CO2: 23 mmol/L (ref 22–32)
Calcium: 8.5 mg/dL — ABNORMAL LOW (ref 8.9–10.3)
Chloride: 102 mmol/L (ref 98–111)
Creatinine, Ser: 1.19 mg/dL — ABNORMAL HIGH (ref 0.44–1.00)
GFR, Estimated: 44 mL/min — ABNORMAL LOW (ref >60)
Glucose, Bld: 101 mg/dL — ABNORMAL HIGH (ref 70–99)
Potassium: 4 mmol/L (ref 3.5–5.1)
Sodium: 135 mmol/L (ref 135–145)

## 2021-09-03 LAB — CULTURE, BLOOD (SINGLE)
Culture: NO GROWTH
Special Requests: ADEQUATE

## 2021-09-03 LAB — MAGNESIUM: Magnesium: 2.5 mg/dL — ABNORMAL HIGH (ref 1.7–2.4)

## 2021-09-03 NOTE — Progress Notes (Addendum)
Pt continues to be bradycardic at HS; notified MD of trend sustaining in low 40s.  Telemetry did notify me of drop to 39 approximately 3 times but not sustained.   EKG ordered for patient per MD

## 2021-09-03 NOTE — Progress Notes (Signed)
PROGRESS NOTE        PATIENT DETAILS Name: Cheryl Atkinson Age: 87 y.o. Sex: female Date of Birth: 1931/07/18 Admit Date: 08/29/2021 Admitting Physician Dewayne Shorter Levora Dredge, MD [id number], [name], MD  Brief Narrative: Patient is a 87 y.o. female with history of HTN, HLD, HFpEF, RLS, PAF, COPD-who presents with 2-day history of poor oral intake/intermittent nausea-and generalized weakness-she was found to be febrile on initial presentation-and subsequent work-up revealed COVID-19 infection.  See below for further details.  Subjective: Awake/alert-denies any chest pain.  Feels better than the past few days.  Objective: Vitals: Blood pressure 128/74, pulse (!) 55, temperature 98.3 F (36.8 C), temperature source Oral, resp. rate 16, height 5\' 7"  (1.702 m), weight 57.5 kg, SpO2 98 %.   Exam: Gen Exam:Alert awake-not in any distress HEENT:atraumatic, normocephalic Chest: B/L clear to auscultation anteriorly CVS:S1S2 regular Abdomen:soft non tender, non distended Extremities:no edema Neurology: Non focal Skin: no rash   Pertinent Labs/Radiology: Na: 135 K: 4.0  creatinine: 1.19 Mg: 2.5  9/28>>Blood culture: No growth 9/28>>Urine Culture: Multiple species   9/28>> CT head: No acute intracranial pathology 9/28>> CT angio chest: No PE.  Ununited right clavicular fracture/subacute bilateral rib fractures. 9/28>> CT abdomen/pelvis: Left renal lesion unchanged since 2021-hemorrhagic/proteinaceous cyst, mild distention of the cecum  Assessment/Plan: COVID-19 infection: Remains stable on room air-no clinical signs of pneumonia-generalized weakness slowly improving-remains on  molnuparivir.   Generalized weakness/debility: Due to above-evaluated by PT/OT-recommendations of SNF on discharge.    Pancytopenia: Likely due to COVID-19 infection-mild-follow periodically.  Hypokalemia: Repleted-recheck periodically.  Hypomagnesemia: Repleted-recheck  periodically.  COPD: Stable-continue bronchodilators  HFpEF: Euvolemic-continue Lasix at daily dosing-change back to twice daily when more stable.   Nocturnal bradycardia: Asymptomatic-not on any rate control agents-check TSH with a.m. labs.  HTN: Controlled-continue Avapro and Lasix  HLD: Continue statin  Chronic atrial fibrillation: Rate controlled-on Eliquis  RLS: Continue transdermal Neupro-and as needed Sinemet.  Left renal lesion: Chronic issue-we will need outpatient renal ultrasound-we will defer to PCP  Distended cecum on CT scan: Unclear clinical significance-abdominal exam is benign.  Right little finger fracture: Splint in place-resume outpatient follow-up with orthopedics as previous.  Procedures: None Consults: None DVT Prophylaxis: Eliquis Code Status: DNR Family Communication: Sister-Brenda-[phone] updated over the phone on 10/2  Time spent: 25 minutes-Greater than 50% of this time was spent in counseling, explanation of diagnosis, planning of further management, and coordination of care.  Diet: Diet Order             Diet heart healthy/carb modified Room service appropriate? No; Fluid consistency: Thin  Diet effective now                      Disposition Plan: Status is: Inpatient  The patient will require care spanning > 2 midnights and should be moved to inpatient because: Inpatient level of care appropriate due to severity of illness  Dispo: The patient is from: Home              Anticipated d/c is to: SNF vs HHPT              Patient currently is not medically stable to d/c.   Difficult to place patient No    Barriers to Discharge: COVID-19 infection with severe weakness/debility-needs SNF on discharge.  Antimicrobial agents: Anti-infectives (From admission, onward)  Start     Dose/Rate Route Frequency Ordered Stop   08/29/21 2200  molnupiravir EUA (LAGEVRIO) capsule 800 mg        4 capsule Oral 2 times daily 08/29/21 1947  09/03/21 2159   08/29/21 1600  cefTRIAXone (ROCEPHIN) 2 g in sodium chloride 0.9 % 100 mL IVPB        2 g 200 mL/hr over 30 Minutes Intravenous  Once 08/29/21 1554 08/29/21 1757   08/29/21 1600  azithromycin (ZITHROMAX) 500 mg in sodium chloride 0.9 % 250 mL IVPB        500 mg 250 mL/hr over 60 Minutes Intravenous  Once 08/29/21 1554 08/29/21 1757        MEDICATIONS: Scheduled Meds:  apixaban  2.5 mg Oral BID   vitamin C  500 mg Oral Daily   ferrous sulfate  325 mg Oral Once per day on Mon Thu   furosemide  40 mg Oral Daily   irbesartan  300 mg Oral Daily   molnupiravir EUA  4 capsule Oral BID   potassium chloride  40 mEq Oral Daily   rosuvastatin  40 mg Oral Daily   Rotigotine  3 mg Transdermal QHS   zinc sulfate  220 mg Oral Daily   Continuous Infusions:  PRN Meds:.acetaminophen, albuterol, carbidopa-levodopa, HYDROcodone-acetaminophen, ondansetron **OR** ondansetron (ZOFRAN) IV, polyethylene glycol, polyvinyl alcohol, traZODone   I have personally reviewed following labs and imaging studies  LABORATORY DATA: CBC: Recent Labs  Lab 08/29/21 1405 08/30/21 0308 08/31/21 0118 09/01/21 0038 09/02/21 0128  WBC 6.0 3.1* 4.2 3.1* 2.6*  NEUTROABS 5.0 2.1 3.0 1.8 1.1*  HGB 10.1* 8.8* 11.4* 10.8* 10.5*  HCT 32.1* 28.3* 35.5* 33.2* 32.7*  MCV 90.9 92.2 89.2 88.1 88.6  PLT 181 134* 144* 127* 132*     Basic Metabolic Panel: Recent Labs  Lab 08/30/21 0308 08/31/21 0118 09/01/21 0038 09/02/21 0128 09/03/21 0038  NA 137 136 134* 137 135  K 3.2* 3.6 2.8* 3.3* 4.0  CL 104 98 97* 100 102  CO2 23 25 26 24 23   GLUCOSE 91 101* 104* 94 101*  BUN 9 10 16 19  24*  CREATININE 0.70 0.84 0.94 1.02* 1.19*  CALCIUM 8.0* 8.2* 7.9* 8.2* 8.5*  MG 1.5* 2.2 1.9 1.8 2.5*  PHOS 2.6  --   --   --   --      GFR: Estimated Creatinine Clearance: 29.7 mL/min (A) (by C-G formula based on SCr of 1.19 mg/dL (H)).  Liver Function Tests: Recent Labs  Lab 08/29/21 1405 08/30/21 0308  08/31/21 0118 09/01/21 0038 09/02/21 0128  AST 19 17 24 26 30   ALT 12 13 17 12 6   ALKPHOS 112 96 116 105 82  BILITOT 0.6 0.8 0.9 0.7 0.5  PROT 7.0 5.7* 7.1 6.6 6.2*  ALBUMIN 3.2* 2.8* 3.3* 2.9* 2.8*    No results for input(s): LIPASE, AMYLASE in the last 168 hours. Recent Labs  Lab 08/29/21 1646  AMMONIA 11     Coagulation Profile: Recent Labs  Lab 08/29/21 1405  INR 1.4*     Cardiac Enzymes: No results for input(s): CKTOTAL, CKMB, CKMBINDEX, TROPONINI in the last 168 hours.  BNP (last 3 results) No results for input(s): PROBNP in the last 8760 hours.  Lipid Profile: No results for input(s): CHOL, HDL, LDLCALC, TRIG, CHOLHDL, LDLDIRECT in the last 72 hours.  Thyroid Function Tests: No results for input(s): TSH, T4TOTAL, FREET4, T3FREE, THYROIDAB in the last 72 hours.  Anemia Panel: No results for  input(s): VITAMINB12, FOLATE, FERRITIN, TIBC, IRON, RETICCTPCT in the last 72 hours.   Urine analysis:    Component Value Date/Time   COLORURINE YELLOW 08/29/2021 1352   APPEARANCEUR CLEAR 08/29/2021 1352   LABSPEC 1.010 08/29/2021 1352   PHURINE 6.0 08/29/2021 1352   GLUCOSEU NEGATIVE 08/29/2021 1352   HGBUR NEGATIVE 08/29/2021 1352   BILIRUBINUR NEGATIVE 08/29/2021 1352   BILIRUBINUR Negative 02/15/2020 1052   KETONESUR NEGATIVE 08/29/2021 1352   PROTEINUR 30 (A) 08/29/2021 1352   UROBILINOGEN 0.2 02/15/2020 1052   UROBILINOGEN 0.2 08/20/2019 1732   NITRITE NEGATIVE 08/29/2021 1352   LEUKOCYTESUR NEGATIVE 08/29/2021 1352    Sepsis Labs: Lactic Acid, Venous    Component Value Date/Time   LATICACIDVEN 1.2 08/29/2021 1646    MICROBIOLOGY: Recent Results (from the past 240 hour(s))  Blood culture (routine single)     Status: None   Collection Time: 08/29/21  2:05 PM   Specimen: BLOOD  Result Value Ref Range Status   Specimen Description BLOOD RIGHT ANTECUBITAL  Final   Special Requests   Final    BOTTLES DRAWN AEROBIC AND ANAEROBIC Blood Culture  adequate volume   Culture   Final    NO GROWTH 5 DAYS Performed at [facility], [address] [place], [place] [address]    Report Status 09/03/2021 FINAL  Final  Resp Panel by RT-PCR (Flu A&B, Covid) Nasopharyngeal Swab     Status: Abnormal   Collection Time: 08/29/21  2:05 PM   Specimen: Nasopharyngeal Swab; Nasopharyngeal(NP) swabs in vial transport medium  Result Value Ref Range Status   SARS Coronavirus 2 by RT PCR POSITIVE (A) NEGATIVE Final    Comment: RESULT CALLED TO, READ BACK BY AND VERIFIED WITH: RN E.BANKS AT 1609 ON 08/29/2021 BY T.SAAD. (NOTE) SARS-CoV-2 target nucleic acids are DETECTED.  The SARS-CoV-2 RNA is generally detectable in upper respiratory specimens during the acute phase of infection. Positive results are indicative of the presence of the identified virus, but do not rule out bacterial infection or co-infection with other pathogens not detected by the test. Clinical correlation with patient history and other diagnostic information is necessary to determine patient infection status. The expected result is Negative.  Fact Sheet for Patients: BloggerCourse.com  Fact Sheet for Healthcare Providers: SeriousBroker.it  This test is not yet approved or cleared by the [place] FDA and  has been authorized for detection and/or diagnosis of SARS-CoV-2 by FDA under an Emergency Use Authorization (EUA).  This EUA will remain in effect (meaning this t est can be used) for the duration of  the COVID-19 declaration under Section 564(b)(1) of the Act, 21 U.S.C. section 360bbb-3(b)(1), unless the authorization is terminated or revoked sooner.     Influenza A by PCR NEGATIVE NEGATIVE Final   Influenza B by PCR NEGATIVE NEGATIVE Final    Comment: (NOTE) The Xpert Xpress SARS-CoV-2/FLU/RSV plus assay is intended as an aid in the diagnosis of influenza from Nasopharyngeal swab specimens and should not be  used as a sole basis for treatment. Nasal washings and aspirates are unacceptable for Xpert Xpress SARS-CoV-2/FLU/RSV testing.  Fact Sheet for Patients: BloggerCourse.com  Fact Sheet for Healthcare Providers: SeriousBroker.it  This test is not yet approved or cleared by the [place] FDA and has been authorized for detection and/or diagnosis of SARS-CoV-2 by FDA under an Emergency Use Authorization (EUA). This EUA will remain in effect (meaning this test can be used) for the duration of the COVID-19 declaration under Section 564(b)(1) of  the Act, 21 U.S.C. section 360bbb-3(b)(1), unless the authorization is terminated or revoked.  Performed at [facility], [address], [place], [place] [address]   Urine Culture     Status: Abnormal   Collection Time: 08/29/21  2:20 PM   Specimen: In/Out Cath Urine  Result Value Ref Range Status   Specimen Description IN/OUT CATH URINE  Final   Special Requests   Final    NONE Performed at [facility], [address], [place], [place] [address]    Culture MULTIPLE SPECIES PRESENT, SUGGEST RECOLLECTION (A)  Final   Report Status 08/30/2021 FINAL  Final    RADIOLOGY STUDIES/RESULTS: No results found.   LOS: 4 days   Jeoffrey Massed, MD  [facility]    To contact the attending provider between 7A-7P or the covering provider during after hours 7P-7A, please log into the web site [email or web address] and access using universal [place] password for that web site. If you do not have the password, please call the hospital operator.  09/03/2021, 11:21 AM

## 2021-09-03 NOTE — Plan of Care (Signed)

## 2021-09-03 NOTE — Progress Notes (Addendum)
Physical Therapy Treatment Patient Details Name: Cheryl Atkinson MRN: [id number] DOB: [birth year] Today's Date: 09/03/2021   History of Present Illness Pt is an 87 y/o female admitted 9/28 secondary to increased weakness. Found to be Covid+. PMH includes HTN, a fib, COPD, CHF.    PT Comments    Patient eager to participate and get stronger. Completed bed level exercises first and then progressed to EOB and attempted sit to stand. Patient reported too fatigued to continue after single, unsuccessful attempt to stand. Pt returned to bed with heels floated.     Recommendations for follow up therapy are one component of a multi-disciplinary discharge planning process, led by the attending physician.  Recommendations may be updated based on patient status, additional functional criteria and insurance authorization.  Follow Up Recommendations  SNF     Equipment Recommendations  None recommended by PT    Recommendations for Other Services       Precautions / Restrictions Precautions Precautions: Fall Restrictions Other Position/Activity Restrictions: pt with prefab R wrist splint, reports from injury sustained during fall at home. Pt denies any current restrictions to RUE (ie weight-bearing).     Mobility  Bed Mobility Overal bed mobility: Needs Assistance Bed Mobility: Rolling;Sidelying to Sit;Sit to Sidelying Rolling: Mod assist Sidelying to sit: Mod assist     Sit to sidelying: Mod assist General bed mobility comments: using rail; generalized weakness and assist with torso and legs    Transfers Overall transfer level: Needs assistance Equipment used: Rolling walker (2 wheeled) Transfers: Sit to/from Stand           General transfer comment: Attempted from EOB (slightly elevated to allow knees lower than hips); unable to stand with max assist (barely clearing buttocks off bed) partially limited by tight heel cords (left worse)  Ambulation/Gait             General  Gait Details: unable   Stairs             Wheelchair Mobility    Modified Rankin (Stroke Patients Only)       Balance Overall balance assessment: Needs assistance Sitting-balance support: Bilateral upper extremity supported;Feet supported Sitting balance-Leahy Scale: Poor Sitting balance - Comments: light support bil UEs                                    Cognition Arousal/Alertness: Awake/alert Behavior During Therapy: Flat affect Overall Cognitive Status: No family/caregiver present to determine baseline cognitive functioning                                 General Comments: following commands briskly      Exercises General Exercises - Lower Extremity Ankle Circles/Pumps: AROM;Both;10 reps Quad Sets: AROM;Both;10 reps Heel Slides: Strengthening;10 reps (resisted extension) Other Exercises Other Exercises: seated bil heel cord stretch with attempt to get feet flat on floor (rt able, left not)    General Comments General comments (skin integrity, edema, etc.): After attempting standing x 1 pt anxiously asking to lie down; denied dizziness reporting fatigued      Pertinent Vitals/Pain Pain Assessment: Faces Faces Pain Scale: No hurt    Home Living Family/patient expects to be discharged to:: Private residence Living Arrangements: Children Available Help at Discharge: Family;Personal care attendant;Available 24 hours/day Type of Home: House Home Access: Level entry   Home Layout: One level  Home Equipment: Walker - 2 wheels;Walker - 4 wheels;Bedside commode;Shower seat;Grab bars - toilet;Grab bars - tub/shower;Wheelchair - manual Additional Comments: Aide is there when son is not there    Prior Function Level of Independence: Needs assistance  Gait / Transfers Assistance Needed: Uses RW for ambulation but needs assist. Uses WC for mobility as well. +1 assist at baseline. ADL's / Homemaking Assistance Needed: Aide assists with  ADLs. Comments: pt endorses needing assist with bed mobility at baseline.   PT Goals (current goals can now be found in the care plan section) Acute Rehab PT Goals Patient Stated Goal: to go home PT Goal Formulation: With patient Time For Goal Achievement: 09/13/21 Potential to Achieve Goals: Good Progress towards PT goals: Progressing toward goals    Frequency    Min 2X/week      PT Plan Current plan remains appropriate;Frequency needs to be updated    Co-evaluation              AM-PAC PT "6 Clicks" Mobility   Outcome Measure  Help needed turning from your back to your side while in a flat bed without using bedrails?: A Lot Help needed moving from lying on your back to sitting on the side of a flat bed without using bedrails?: A Lot Help needed moving to and from a bed to a chair (including a wheelchair)?: Total Help needed standing up from a chair using your arms (e.g., wheelchair or bedside chair)?: Total Help needed to walk in hospital room?: Total Help needed climbing 3-5 steps with a railing? : Total 6 Click Score: 8    End of Session   Activity Tolerance: Patient limited by fatigue Patient left: in bed;with call bell/phone within reach;with bed alarm set Nurse Communication: Mobility status PT Visit Diagnosis: Unsteadiness on feet (R26.81);Muscle weakness (generalized) (M62.81);Difficulty in walking, not elsewhere classified (R26.2)     Time: 1610-9604 PT Time Calculation (min) (ACUTE ONLY): 41 min  Charges:  $Therapeutic Exercise: 8-22 mins $Therapeutic Activity: 23-37 mins                      Jerolyn Center, PT Pager [phone]     Zena Amos 09/03/2021, 11:04 AM

## 2021-09-04 LAB — BASIC METABOLIC PANEL
Anion gap: 11 (ref 5–15)
BUN: 26 mg/dL — ABNORMAL HIGH (ref 8–23)
CO2: 24 mmol/L (ref 22–32)
Calcium: 8.7 mg/dL — ABNORMAL LOW (ref 8.9–10.3)
Chloride: 100 mmol/L (ref 98–111)
Creatinine, Ser: 0.98 mg/dL (ref 0.44–1.00)
GFR, Estimated: 56 mL/min — ABNORMAL LOW (ref >60)
Glucose, Bld: 122 mg/dL — ABNORMAL HIGH (ref 70–99)
Potassium: 3.9 mmol/L (ref 3.5–5.1)
Sodium: 135 mmol/L (ref 135–145)

## 2021-09-04 LAB — CBC
HCT: 33 % — ABNORMAL LOW (ref 36.0–46.0)
Hemoglobin: 10.4 g/dL — ABNORMAL LOW (ref 12.0–15.0)
MCH: 28.3 pg (ref 26.0–34.0)
MCHC: 31.5 g/dL (ref 30.0–36.0)
MCV: 89.9 fL (ref 80.0–100.0)
Platelets: 137 10*3/uL — ABNORMAL LOW (ref 150–400)
RBC: 3.67 MIL/uL — ABNORMAL LOW (ref 3.87–5.11)
RDW: 15.3 % (ref 11.5–15.5)
WBC: 3.7 10*3/uL — ABNORMAL LOW (ref 4.0–10.5)
nRBC: 0 % (ref 0.0–0.2)

## 2021-09-04 LAB — TSH: TSH: 1.157 u[IU]/mL (ref 0.350–4.500)

## 2021-09-04 NOTE — Progress Notes (Signed)
Occupational Therapy Treatment Patient Details Name: Cheryl Atkinson MRN: [id number] DOB: 01/23/1931 Today's Date: 09/04/2021   History of present illness Pt is an 87 y/o female admitted 9/28 secondary to increased weakness. Found to be Covid+. PMH includes HTN, a fib, COPD, CHF.   OT comments  Pt progressing towards acute OT goals. Focus of session was functional transfers and working on static standing balance. Today pt needed up to max A +2 to stand. RW and max +2 steadying assist in static standing (ie zero standing balance). 3x sit<>stands from recliner. Heavy posterior lean, left lateral lean. Full session details below. D/c plan remains appropriate.    Recommendations for follow up therapy are one component of a multi-disciplinary discharge planning process, led by the attending physician.  Recommendations may be updated based on patient status, additional functional criteria and insurance authorization.    Follow Up Recommendations  SNF    Equipment Recommendations  Other (comment) (defer to next venue)    Recommendations for Other Services      Precautions / Restrictions Precautions Precautions: Fall Restrictions Weight Bearing Restrictions: No       Mobility Bed Mobility               General bed mobility comments: pt up in recliner    Transfers Overall transfer level: Needs assistance Equipment used: Rolling walker (2 wheeled) Transfers: Sit to/from Stand           General transfer comment: 3x from recliner, stood < 30 seconds. Max +2 assist and rw utilized. Stabilized rw for pt to pull up on (rollator at basline) for first 2 stands then cued to push up with both hands. Significant posterior lean and narrow base of support. Pt's shoes on for transfer trials.    Balance Overall balance assessment: Needs assistance Sitting-balance support: Bilateral upper extremity supported;Feet supported Sitting balance-Leahy Scale: Poor Sitting balance - Comments:  light support bil UEs Postural control: Left lateral lean;Posterior lean   Standing balance-Leahy Scale: Zero Standing balance comment: max A +2 and rw to stand                           ADL either performed or assessed with clinical judgement   ADL Overall ADL's : Needs assistance/impaired             Lower Body Bathing: Maximal assistance;+2 for physical assistance;Sit to/from stand Lower Body Bathing Details (indicate cue type and reason): clinical judgement                       General ADL Comments: Pt received in recliner. Completed 3x sit<>stands with mod-max A +2. Posterior lean and narrow BOS. Stood about 30 seconds before pt initiating descent into recliner.     Vision       Perception     Praxis      Cognition Arousal/Alertness: Awake/alert Behavior During Therapy: Flat affect Overall Cognitive Status: No family/caregiver present to determine baseline cognitive functioning                                 General Comments: decreased insight into current deficits. Pt with zero standing balance (3x sit<>stand, max +2 and rw to stand) and stood < 30 seconds but pt verbalizing goal of walking this session. Discussed goal of working on standing balance and tolerance as a precursor. Pt verbalized she  doesn't understand why she could walk at home but not in the hospital.        Exercises     Shoulder Instructions       General Comments      Pertinent Vitals/ Pain       Pain Assessment: Faces Faces Pain Scale: Hurts little more Pain Location: unspecified, grimacing during transfer and standing Pain Descriptors / Indicators: Grimacing Pain Intervention(s): Monitored during session;Limited activity within patient's tolerance;Repositioned  Home Living                                          Prior Functioning/Environment              Frequency  Min 2X/week        Progress Toward Goals  OT  Goals(current goals can now be found in the care plan section)  Progress towards OT goals: Progressing toward goals  Acute Rehab OT Goals Patient Stated Goal: to go home OT Goal Formulation: With patient Time For Goal Achievement: 09/14/21 Potential to Achieve Goals: Good ADL Goals Pt Will Perform Upper Body Dressing: with mod assist;sitting Pt Will Perform Lower Body Dressing: with mod assist;sit to/from stand Pt Will Transfer to Toilet: with mod assist;ambulating;bedside commode Pt Will Perform Toileting - Clothing Manipulation and hygiene: with mod assist;sit to/from stand Additional ADL Goal #1: Pt will complete bed mobility at mod A level to prepare for EOB/OOB ADLs  Plan Discharge plan remains appropriate    Co-evaluation                 AM-PAC OT "6 Clicks" Daily Activity     Outcome Measure   Help from another person eating meals?: A Little Help from another person taking care of personal grooming?: A Lot Help from another person toileting, which includes using toliet, bedpan, or urinal?: Total Help from another person bathing (including washing, rinsing, drying)?: Total Help from another person to put on and taking off regular upper body clothing?: A Lot Help from another person to put on and taking off regular lower body clothing?: Total 6 Click Score: 10    End of Session Equipment Utilized During Treatment: Rolling walker  OT Visit Diagnosis: Unsteadiness on feet (R26.81);Muscle weakness (generalized) (M62.81);Pain;Other symptoms and signs involving cognitive function;History of falling (Z91.81);Repeated falls (R29.6)   Activity Tolerance Patient limited by fatigue   Patient Left in chair;with call bell/phone within reach;with chair alarm set   Nurse Communication          Time: 4656-8127 OT Time Calculation (min): 18 min  Charges: OT General Charges $OT Visit: 1 Visit OT Treatments $Self Care/Home Management : 8-22 mins  Raynald Kemp,  OT Acute Rehabilitation Services Pager: [phone] Office: [phone]   Pilar Grammes 09/04/2021, 1:46 PM

## 2021-09-04 NOTE — TOC Progression Note (Signed)
Transition of Care [facility]) - Progression Note    Patient Details  Name: Cheryl Atkinson MRN: [id number] Date of Birth: 1931-03-20  Transition of Care [facility]) CM/SW Contact  Mearl Latin, LCSW Phone Number: 09/04/2021, 4:48 PM  Clinical Narrative:    CSW updated patient's POA, Ste[facility] Drone.    Expected Discharge Plan: Skilled Nursing Facility Barriers to Discharge: Continued Medical Work up, SNF Covid Recovering  Expected Discharge Plan and Services Expected Discharge Plan: Skilled Nursing Facility   Discharge Planning Services: CM Consult   Living arrangements for the past 2 months: Single Family Home                                       Social Determinants of Health (SDOH) Interventions    Readmission Risk Interventions No flowsheet data found.

## 2021-09-04 NOTE — Plan of Care (Signed)

## 2021-09-04 NOTE — Progress Notes (Signed)
PROGRESS NOTE        PATIENT DETAILS Name: Cheryl Atkinson Age: 87 y.o. Sex: female Date of Birth: Jun 29, 1931 Admit Date: 08/29/2021 Admitting Physician Dewayne Shorter Levora Dredge, MD [id number], [name], MD  Brief Narrative: Patient is a 87 y.o. female with history of HTN, HLD, HFpEF, RLS, PAF, COPD-who presents with 2-day history of poor oral intake/intermittent nausea-and generalized weakness-she was found to be febrile on initial presentation-and subsequent work-up revealed COVID-19 infection.  See below for further details.  Subjective: Lying comfortably in bed.  No major issues.  Objective: Vitals: Blood pressure 115/79, pulse 67, temperature 97.9 F (36.6 C), temperature source Oral, resp. rate 18, height 5\' 7"  (1.702 m), weight 57.5 kg, SpO2 94 %.   Exam: Gen Exam:Alert awake-not in any distress HEENT:atraumatic, normocephalic Chest: B/L clear to auscultation anteriorly CVS:S1S2 regular Abdomen:soft non tender, non distended Extremities:no edema Neurology: Non focal Skin: no rash   Pertinent Labs/Radiology: Na: 135 K: 3.9  creatinine: 0.98   WBC: 3.7 Hb: 10.4 Platelet: 137  9/28>>Blood culture: No growth 9/28>>Urine Culture: Multiple species  9/28>> CT head: No acute intracranial pathology 9/28>> CT angio chest: No PE.  Ununited right clavicular fracture/subacute bilateral rib fractures. 9/28>> CT abdomen/pelvis: Left renal lesion unchanged since 2021-hemorrhagic/proteinaceous cyst, mild distention of the cecum  Assessment/Plan: COVID-19 infection: Remains stable on room air-no clinical signs of pneumonia-generalized weakness slowly improving-completed molnuparivir x5 days.  Generalized weakness/debility: Due to above-evaluated by PT/OT-recommendations of SNF on discharge.    Pancytopenia: Likely due to COVID-19 infection-mild-follow periodically.  Hypokalemia: Repleted-recheck periodically.  Hypomagnesemia: Repleted-recheck  periodically.  COPD: Stable-continue bronchodilators  HFpEF: Euvolemic-continue Lasix at daily dosing-change back to twice daily when more stable.   Nocturnal bradycardia: Asymptomatic-not on any rate control agents- TSH normal.   HTN: Controlled-continue Avapro and Lasix  HLD: Continue statin  Chronic atrial fibrillation: Rate controlled-on Eliquis  RLS: Continue transdermal Neupro-and as needed Sinemet.  Left renal lesion: Chronic issue-we will need outpatient renal ultrasound-we will defer to PCP  Distended cecum on CT scan: Unclear clinical significance-abdominal exam is benign.  Right little finger fracture: Splint in place-resume outpatient follow-up with orthopedics as previous.  Procedures: None Consults: None DVT Prophylaxis: Eliquis Code Status: DNR Family Communication: Sister-[phone] updated over the phone on 10/2  Time spent: 25 minutes-Greater than 50% of this time was spent in counseling, explanation of diagnosis, planning of further management, and coordination of care.  Diet: Diet Order             Diet heart healthy/carb modified Room service appropriate? No; Fluid consistency: Thin  Diet effective now                      Disposition Plan: Status is: Inpatient  The patient will require care spanning > 2 midnights and should be moved to inpatient because: Inpatient level of care appropriate due to severity of illness  Dispo: The patient is from: Home              Anticipated d/c is to: SNF vs HHPT              Patient currently is not medically stable to d/c.   Difficult to place patient No    Barriers to Discharge: COVID-19 infection with severe weakness/debility-needs SNF on discharge.  Antimicrobial agents: Anti-infectives (From admission, onward)  Start     Dose/Rate Route Frequency Ordered Stop   08/29/21 2200  molnupiravir EUA (LAGEVRIO) capsule 800 mg        4 capsule Oral 2 times daily 08/29/21 1947 09/03/21 2159    08/29/21 1600  cefTRIAXone (ROCEPHIN) 2 g in sodium chloride 0.9 % 100 mL IVPB        2 g 200 mL/hr over 30 Minutes Intravenous  Once 08/29/21 1554 08/29/21 1757   08/29/21 1600  azithromycin (ZITHROMAX) 500 mg in sodium chloride 0.9 % 250 mL IVPB        500 mg 250 mL/hr over 60 Minutes Intravenous  Once 08/29/21 1554 08/29/21 1757        MEDICATIONS: Scheduled Meds:  apixaban  2.5 mg Oral BID   vitamin C  500 mg Oral Daily   ferrous sulfate  325 mg Oral Once per day on Mon Thu   furosemide  40 mg Oral Daily   irbesartan  300 mg Oral Daily   potassium chloride  40 mEq Oral Daily   rosuvastatin  40 mg Oral Daily   Rotigotine  3 mg Transdermal QHS   zinc sulfate  220 mg Oral Daily   Continuous Infusions:  PRN Meds:.acetaminophen, albuterol, carbidopa-levodopa, HYDROcodone-acetaminophen, ondansetron **OR** ondansetron (ZOFRAN) IV, polyethylene glycol, polyvinyl alcohol, traZODone   I have personally reviewed following labs and imaging studies  LABORATORY DATA: CBC: Recent Labs  Lab 08/29/21 1405 08/30/21 0308 08/31/21 0118 09/01/21 0038 09/02/21 0128 09/04/21 0107  WBC 6.0 3.1* 4.2 3.1* 2.6* 3.7*  NEUTROABS 5.0 2.1 3.0 1.8 1.1*  --   HGB 10.1* 8.8* 11.4* 10.8* 10.5* 10.4*  HCT 32.1* 28.3* 35.5* 33.2* 32.7* 33.0*  MCV 90.9 92.2 89.2 88.1 88.6 89.9  PLT 181 134* 144* 127* 132* 137*     Basic Metabolic Panel: Recent Labs  Lab 08/30/21 0308 08/31/21 0118 09/01/21 0038 09/02/21 0128 09/03/21 0038 09/04/21 0107  NA 137 136 134* 137 135 135  K 3.2* 3.6 2.8* 3.3* 4.0 3.9  CL 104 98 97* 100 102 100  CO2 23 25 26 24 23 24   GLUCOSE 91 101* 104* 94 101* 122*  BUN 9 10 16 19  24* 26*  CREATININE 0.70 0.84 0.94 1.02* 1.19* 0.98  CALCIUM 8.0* 8.2* 7.9* 8.2* 8.5* 8.7*  MG 1.5* 2.2 1.9 1.8 2.5*  --   PHOS 2.6  --   --   --   --   --      GFR: Estimated Creatinine Clearance: 36 mL/min (by C-G formula based on SCr of 0.98 mg/dL).  Liver Function Tests: Recent  Labs  Lab 08/29/21 1405 08/30/21 0308 08/31/21 0118 09/01/21 0038 09/02/21 0128  AST 19 17 24 26 30   ALT 12 13 17 12 6   ALKPHOS 112 96 116 105 82  BILITOT 0.6 0.8 0.9 0.7 0.5  PROT 7.0 5.7* 7.1 6.6 6.2*  ALBUMIN 3.2* 2.8* 3.3* 2.9* 2.8*    No results for input(s): LIPASE, AMYLASE in the last 168 hours. Recent Labs  Lab 08/29/21 1646  AMMONIA 11     Coagulation Profile: Recent Labs  Lab 08/29/21 1405  INR 1.4*     Cardiac Enzymes: No results for input(s): CKTOTAL, CKMB, CKMBINDEX, TROPONINI in the last 168 hours.  BNP (last 3 results) No results for input(s): PROBNP in the last 8760 hours.  Lipid Profile: No results for input(s): CHOL, HDL, LDLCALC, TRIG, CHOLHDL, LDLDIRECT in the last 72 hours.  Thyroid Function Tests: Recent Labs  09/04/21 0107  TSH 1.157    Anemia Panel: No results for input(s): VITAMINB12, FOLATE, FERRITIN, TIBC, IRON, RETICCTPCT in the last 72 hours.   Urine analysis:    Component Value Date/Time   COLORURINE YELLOW 08/29/2021 1352   APPEARANCEUR CLEAR 08/29/2021 1352   LABSPEC 1.010 08/29/2021 1352   PHURINE 6.0 08/29/2021 1352   GLUCOSEU NEGATIVE 08/29/2021 1352   HGBUR NEGATIVE 08/29/2021 1352   BILIRUBINUR NEGATIVE 08/29/2021 1352   BILIRUBINUR Negative 02/15/2020 1052   KETONESUR NEGATIVE 08/29/2021 1352   PROTEINUR 30 (A) 08/29/2021 1352   UROBILINOGEN 0.2 02/15/2020 1052   UROBILINOGEN 0.2 08/20/2019 1732   NITRITE NEGATIVE 08/29/2021 1352   LEUKOCYTESUR NEGATIVE 08/29/2021 1352    Sepsis Labs: Lactic Acid, Venous    Component Value Date/Time   LATICACIDVEN 1.2 08/29/2021 1646    MICROBIOLOGY: Recent Results (from the past 240 hour(s))  Blood culture (routine single)     Status: None   Collection Time: 08/29/21  2:05 PM   Specimen: BLOOD  Result Value Ref Range Status   Specimen Description BLOOD RIGHT ANTECUBITAL  Final   Special Requests   Final    BOTTLES DRAWN AEROBIC AND ANAEROBIC Blood Culture  adequate volume   Culture   Final    NO GROWTH 5 DAYS Performed at [facility], [address], [place], [place] [address]    Report Status 09/03/2021 FINAL  Final  Resp Panel by RT-PCR (Flu A&B, Covid) Nasopharyngeal Swab     Status: Abnormal   Collection Time: 08/29/21  2:05 PM   Specimen: Nasopharyngeal Swab; Nasopharyngeal(NP) swabs in vial transport medium  Result Value Ref Range Status   SARS Coronavirus 2 by RT PCR POSITIVE (A) NEGATIVE Final    Comment: RESULT CALLED TO, READ BACK BY AND VERIFIED WITH: RN E.BANKS AT 1609 ON 08/29/2021 BY T.SAAD. (NOTE) SARS-CoV-2 target nucleic acids are DETECTED.  The SARS-CoV-2 RNA is generally detectable in upper respiratory specimens during the acute phase of infection. Positive results are indicative of the presence of the identified virus, but do not rule out bacterial infection or co-infection with other pathogens not detected by the test. Clinical correlation with patient history and other diagnostic information is necessary to determine patient infection status. The expected result is Negative.  Fact Sheet for Patients: BloggerCourse.com  Fact Sheet for Healthcare Providers: SeriousBroker.it  This test is not yet approved or cleared by the [place] FDA and  has been authorized for detection and/or diagnosis of SARS-CoV-2 by FDA under an Emergency Use Authorization (EUA).  This EUA will remain in effect (meaning this t est can be used) for the duration of  the COVID-19 declaration under Section 564(b)(1) of the Act, 21 U.S.C. section 360bbb-3(b)(1), unless the authorization is terminated or revoked sooner.     Influenza A by PCR NEGATIVE NEGATIVE Final   Influenza B by PCR NEGATIVE NEGATIVE Final    Comment: (NOTE) The Xpert Xpress SARS-CoV-2/FLU/RSV plus assay is intended as an aid in the diagnosis of influenza from Nasopharyngeal swab specimens and should not be  used as a sole basis for treatment. Nasal washings and aspirates are unacceptable for Xpert Xpress SARS-CoV-2/FLU/RSV testing.  Fact Sheet for Patients: BloggerCourse.com  Fact Sheet for Healthcare Providers: SeriousBroker.it  This test is not yet approved or cleared by the [place] FDA and has been authorized for detection and/or diagnosis of SARS-CoV-2 by FDA under an Emergency Use Authorization (EUA). This EUA will remain in effect (meaning this test can  be used) for the duration of the COVID-19 declaration under Section 564(b)(1) of the Act, 21 U.S.C. section 360bbb-3(b)(1), unless the authorization is terminated or revoked.  Performed at [facility], [address] [place], [place] [address]   Urine Culture     Status: Abnormal   Collection Time: 08/29/21  2:20 PM   Specimen: In/Out Cath Urine  Result Value Ref Range Status   Specimen Description IN/OUT CATH URINE  Final   Special Requests   Final    NONE Performed at [facility], [address], [place], [place] [address]    Culture MULTIPLE SPECIES PRESENT, SUGGEST RECOLLECTION (A)  Final   Report Status 08/30/2021 FINAL  Final    RADIOLOGY STUDIES/RESULTS: No results found.   LOS: 5 days   Jeoffrey Massed, MD  [facility]    To contact the attending provider between 7A-7P or the covering provider during after hours 7P-7A, please log into the web site [email or web address] and access using universal [place] password for that web site. If you do not have the password, please call the hospital operator.  09/04/2021, 11:16 AM

## 2021-09-05 ENCOUNTER — Ambulatory Visit: Payer: Medicare Other | Admitting: Orthopaedic Surgery

## 2021-09-05 NOTE — Progress Notes (Signed)
PROGRESS NOTE        PATIENT DETAILS Name: Cheryl Atkinson Age: 87 y.o. Sex: female Date of Birth: Apr 17, 1931 Admit Date: 08/29/2021 Admitting Physician Dewayne Shorter Levora Dredge, MD [id number], [name], MD  Brief Narrative: Patient is a 87 y.o. female with history of HTN, HLD, HFpEF, RLS, PAF, COPD-who presents with 2-day history of poor oral intake/intermittent nausea-and generalized weakness-she was found to be febrile on initial presentation-and subsequent work-up revealed COVID-19 infection.  See below for further details.  Subjective: Lying comfortably in bed-no major issues overnight.  Objective: Vitals: Blood pressure 128/75, pulse (!) 58, temperature 98.6 F (37 C), temperature source Oral, resp. rate 17, height 5\' 7"  (1.702 m), weight 57.5 kg, SpO2 98 %.   Exam: Gen Exam:Alert awake-not in any distress HEENT:atraumatic, normocephalic Chest: B/L clear to auscultation anteriorly CVS:S1S2 regular Abdomen:soft non tender, non distended Extremities:no edema Neurology: Non focal Skin: no rash   Pertinent Labs/Radiology: Na: 135 K: 3.9  creatinine: 0.98   WBC: 3.7 Hb: 10.4 Platelet: 137  9/28>>Blood culture: No growth 9/28>>Urine Culture: Multiple species  9/28>> CT head: No acute intracranial pathology 9/28>> CT angio chest: No PE.  Ununited right clavicular fracture/subacute bilateral rib fractures. 9/28>> CT abdomen/pelvis: Left renal lesion unchanged since 2021-hemorrhagic/proteinaceous cyst, mild distention of the cecum  Assessment/Plan: COVID-19 infection: Remains stable on room air-no clinical signs of pneumonia-generalized weakness slowly improving-completed molnuparivir x5 days.  Generalized weakness/debility: Due to above-evaluated by PT/OT-recommendations of SNF on discharge.    Pancytopenia: Likely due to COVID-19 infection-mild-follow periodically.  Hypokalemia: Repleted-recheck periodically.  Hypomagnesemia: Repleted-recheck  periodically.  COPD: Stable-continue bronchodilators  HFpEF: Euvolemic-continue Lasix at daily dosing-change back to twice daily when more stable.   Nocturnal bradycardia: Asymptomatic-not on any rate control agents- TSH normal.   HTN: Controlled-continue Avapro and Lasix  HLD: Continue statin  Chronic atrial fibrillation: Rate controlled-on Eliquis  RLS: Continue transdermal Neupro-and as needed Sinemet.  Left renal lesion: Chronic issue-we will need outpatient renal ultrasound-we will defer to PCP  Distended cecum on CT scan: Unclear clinical significance-abdominal exam is benign.  Right little finger fracture: Splint in place-resume outpatient follow-up with orthopedics as previous.  Procedures: None Consults: None DVT Prophylaxis: Eliquis Code Status: DNR Family Communication: Sister-Brenda-[phone] updated over the phone on 10/2  Time spent: 15 minutes-Greater than 50% of this time was spent in counseling, explanation of diagnosis, planning of further management, and coordination of care.  Diet: Diet Order             Diet heart healthy/carb modified Room service appropriate? No; Fluid consistency: Thin  Diet effective now                      Disposition Plan: Status is: Inpatient  The patient will require care spanning > 2 midnights and should be moved to inpatient because: Inpatient level of care appropriate due to severity of illness  Dispo: The patient is from: Home              Anticipated d/c is to: SNF vs HHPT              Patient currently is not medically stable to d/c.   Difficult to place patient No    Barriers to Discharge: COVID-19 infection with severe weakness/debility-needs SNF on discharge.  Antimicrobial agents: Anti-infectives (From admission, onward)  Start     Dose/Rate Route Frequency Ordered Stop   08/29/21 2200  molnupiravir EUA (LAGEVRIO) capsule 800 mg        4 capsule Oral 2 times daily 08/29/21 1947 09/03/21 2159    08/29/21 1600  cefTRIAXone (ROCEPHIN) 2 g in sodium chloride 0.9 % 100 mL IVPB        2 g 200 mL/hr over 30 Minutes Intravenous  Once 08/29/21 1554 08/29/21 1757   08/29/21 1600  azithromycin (ZITHROMAX) 500 mg in sodium chloride 0.9 % 250 mL IVPB        500 mg 250 mL/hr over 60 Minutes Intravenous  Once 08/29/21 1554 08/29/21 1757        MEDICATIONS: Scheduled Meds:  apixaban  2.5 mg Oral BID   vitamin C  500 mg Oral Daily   ferrous sulfate  325 mg Oral Once per day on Mon Thu   furosemide  40 mg Oral Daily   irbesartan  300 mg Oral Daily   potassium chloride  40 mEq Oral Daily   rosuvastatin  40 mg Oral Daily   Rotigotine  3 mg Transdermal QHS   zinc sulfate  220 mg Oral Daily   Continuous Infusions:  PRN Meds:.acetaminophen, albuterol, carbidopa-levodopa, HYDROcodone-acetaminophen, ondansetron **OR** ondansetron (ZOFRAN) IV, polyethylene glycol, polyvinyl alcohol, traZODone   I have personally reviewed following labs and imaging studies  LABORATORY DATA: CBC: Recent Labs  Lab 08/29/21 1405 08/30/21 0308 08/31/21 0118 09/01/21 0038 09/02/21 0128 09/04/21 0107  WBC 6.0 3.1* 4.2 3.1* 2.6* 3.7*  NEUTROABS 5.0 2.1 3.0 1.8 1.1*  --   HGB 10.1* 8.8* 11.4* 10.8* 10.5* 10.4*  HCT 32.1* 28.3* 35.5* 33.2* 32.7* 33.0*  MCV 90.9 92.2 89.2 88.1 88.6 89.9  PLT 181 134* 144* 127* 132* 137*     Basic Metabolic Panel: Recent Labs  Lab 08/30/21 0308 08/31/21 0118 09/01/21 0038 09/02/21 0128 09/03/21 0038 09/04/21 0107  NA 137 136 134* 137 135 135  K 3.2* 3.6 2.8* 3.3* 4.0 3.9  CL 104 98 97* 100 102 100  CO2 23 25 26 24 23 24   GLUCOSE 91 101* 104* 94 101* 122*  BUN 9 10 16 19  24* 26*  CREATININE 0.70 0.84 0.94 1.02* 1.19* 0.98  CALCIUM 8.0* 8.2* 7.9* 8.2* 8.5* 8.7*  MG 1.5* 2.2 1.9 1.8 2.5*  --   PHOS 2.6  --   --   --   --   --      GFR: Estimated Creatinine Clearance: 36 mL/min (by C-G formula based on SCr of 0.98 mg/dL).  Liver Function Tests: Recent  Labs  Lab 08/29/21 1405 08/30/21 0308 08/31/21 0118 09/01/21 0038 09/02/21 0128  AST 19 17 24 26 30   ALT 12 13 17 12 6   ALKPHOS 112 96 116 105 82  BILITOT 0.6 0.8 0.9 0.7 0.5  PROT 7.0 5.7* 7.1 6.6 6.2*  ALBUMIN 3.2* 2.8* 3.3* 2.9* 2.8*    No results for input(s): LIPASE, AMYLASE in the last 168 hours. Recent Labs  Lab 08/29/21 1646  AMMONIA 11     Coagulation Profile: Recent Labs  Lab 08/29/21 1405  INR 1.4*     Cardiac Enzymes: No results for input(s): CKTOTAL, CKMB, CKMBINDEX, TROPONINI in the last 168 hours.  BNP (last 3 results) No results for input(s): PROBNP in the last 8760 hours.  Lipid Profile: No results for input(s): CHOL, HDL, LDLCALC, TRIG, CHOLHDL, LDLDIRECT in the last 72 hours.  Thyroid Function Tests: Recent Labs  09/04/21 0107  TSH 1.157     Anemia Panel: No results for input(s): VITAMINB12, FOLATE, FERRITIN, TIBC, IRON, RETICCTPCT in the last 72 hours.   Urine analysis:    Component Value Date/Time   COLORURINE YELLOW 08/29/2021 1352   APPEARANCEUR CLEAR 08/29/2021 1352   LABSPEC 1.010 08/29/2021 1352   PHURINE 6.0 08/29/2021 1352   GLUCOSEU NEGATIVE 08/29/2021 1352   HGBUR NEGATIVE 08/29/2021 1352   BILIRUBINUR NEGATIVE 08/29/2021 1352   BILIRUBINUR Negative 02/15/2020 1052   KETONESUR NEGATIVE 08/29/2021 1352   PROTEINUR 30 (A) 08/29/2021 1352   UROBILINOGEN 0.2 02/15/2020 1052   UROBILINOGEN 0.2 08/20/2019 1732   NITRITE NEGATIVE 08/29/2021 1352   LEUKOCYTESUR NEGATIVE 08/29/2021 1352    Sepsis Labs: Lactic Acid, Venous    Component Value Date/Time   LATICACIDVEN 1.2 08/29/2021 1646    MICROBIOLOGY: Recent Results (from the past 240 hour(s))  Blood culture (routine single)     Status: None   Collection Time: 08/29/21  2:05 PM   Specimen: BLOOD  Result Value Ref Range Status   Specimen Description BLOOD RIGHT ANTECUBITAL  Final   Special Requests   Final    BOTTLES DRAWN AEROBIC AND ANAEROBIC Blood Culture  adequate volume   Culture   Final    NO GROWTH 5 DAYS Performed at [facility], [address], [place], [place] [address]    Report Status 09/03/2021 FINAL  Final  Resp Panel by RT-PCR (Flu A&B, Covid) Nasopharyngeal Swab     Status: Abnormal   Collection Time: 08/29/21  2:05 PM   Specimen: Nasopharyngeal Swab; Nasopharyngeal(NP) swabs in vial transport medium  Result Value Ref Range Status   SARS Coronavirus 2 by RT PCR POSITIVE (A) NEGATIVE Final    Comment: RESULT CALLED TO, READ BACK BY AND VERIFIED WITH: RN E.BANKS AT 1609 ON 08/29/2021 BY T.SAAD. (NOTE) SARS-CoV-2 target nucleic acids are DETECTED.  The SARS-CoV-2 RNA is generally detectable in upper respiratory specimens during the acute phase of infection. Positive results are indicative of the presence of the identified virus, but do not rule out bacterial infection or co-infection with other pathogens not detected by the test. Clinical correlation with patient history and other diagnostic information is necessary to determine patient infection status. The expected result is Negative.  Fact Sheet for Patients: BloggerCourse.com  Fact Sheet for Healthcare Providers: SeriousBroker.it  This test is not yet approved or cleared by the [place] FDA and  has been authorized for detection and/or diagnosis of SARS-CoV-2 by FDA under an Emergency Use Authorization (EUA).  This EUA will remain in effect (meaning this t est can be used) for the duration of  the COVID-19 declaration under Section 564(b)(1) of the Act, 21 U.S.C. section 360bbb-3(b)(1), unless the authorization is terminated or revoked sooner.     Influenza A by PCR NEGATIVE NEGATIVE Final   Influenza B by PCR NEGATIVE NEGATIVE Final    Comment: (NOTE) The Xpert Xpress SARS-CoV-2/FLU/RSV plus assay is intended as an aid in the diagnosis of influenza from Nasopharyngeal swab specimens and should not be  used as a sole basis for treatment. Nasal washings and aspirates are unacceptable for Xpert Xpress SARS-CoV-2/FLU/RSV testing.  Fact Sheet for Patients: BloggerCourse.com  Fact Sheet for Healthcare Providers: SeriousBroker.it  This test is not yet approved or cleared by the [place] FDA and has been authorized for detection and/or diagnosis of SARS-CoV-2 by FDA under an Emergency Use Authorization (EUA). This EUA will remain in effect (meaning this test  can be used) for the duration of the COVID-19 declaration under Section 564(b)(1) of the Act, 21 U.S.C. section 360bbb-3(b)(1), unless the authorization is terminated or revoked.  Performed at [facility], [address], [place], [place] [address]   Urine Culture     Status: Abnormal   Collection Time: 08/29/21  2:20 PM   Specimen: In/Out Cath Urine  Result Value Ref Range Status   Specimen Description IN/OUT CATH URINE  Final   Special Requests   Final    NONE Performed at [facility], [address], [place], [place] [address]    Culture MULTIPLE SPECIES PRESENT, SUGGEST RECOLLECTION (A)  Final   Report Status 08/30/2021 FINAL  Final    RADIOLOGY STUDIES/RESULTS: No results found.   LOS: 6 days   Jeoffrey Massed, MD  [facility]    To contact the attending provider between 7A-7P or the covering provider during after hours 7P-7A, please log into the web site [email or web address] and access using universal [place] password for that web site. If you do not have the password, please call the hospital operator.  09/05/2021, 2:03 PM

## 2021-09-05 NOTE — Consult Note (Signed)
   [facility] Inpatient Consult   09/05/2021  CYNIA ABRUZZO Nov 22, 1931 [id number]  [facility] [[facility]]  Accountable Care Organization [ACO] Patient:  Medicare CMS DCE  Current telephonic follow up due to COVID-19 positive  Follow up:  Recommendation for SNF transition of care for rehab.  Plan:  Can alert [facility] PAC RN if patient admits to a [facility] affiliated facility.  Continue to follow for progress.  Charlesetta Shanks, RN BSN CCM [facility]  [phone] business mobile phone Toll free office [phone]  Fax number: [phone] [email or web address]

## 2021-09-06 MED ORDER — BISACODYL 10 MG RE SUPP: 10.0000 mg | Freq: Every day | RECTAL | Status: DC | PRN

## 2021-09-06 NOTE — Progress Notes (Signed)
PROGRESS NOTE        PATIENT DETAILS Name: Cheryl Atkinson Age: 87 y.o. Sex: female Date of Birth: 02-05-1931 Admit Date: 08/29/2021 Admitting Physician Dewayne Shorter Levora Dredge, MD VOZ:[id number], [name], MD  Brief Narrative: Patient is a 87 y.o. female with history of HTN, HLD, HFpEF, RLS, PAF, COPD-who presents with 2-day history of poor oral intake/intermittent nausea-and generalized weakness-she was found to be febrile on initial presentation-and subsequent work-up revealed COVID-19 infection.  See below for further details.  Subjective: No chest pain or shortness of breath-frustrated that she is still in the hospital.  Awaiting SNF bed.  Objective: Vitals: Blood pressure (!) 104/59, pulse 86, temperature (!) 97.4 F (36.3 C), temperature source Oral, resp. rate 17, height 5\' 7"  (1.702 m), weight 58.5 kg, SpO2 97 %.   Exam: Gen Exam:Alert awake-not in any distress HEENT:atraumatic, normocephalic Chest: B/L clear to auscultation anteriorly CVS:S1S2 regular Abdomen:soft non tender, non distended Extremities:no edema Neurology: Non focal Skin: no rash   Pertinent Labs/Radiology: Na: 135 K: 3.9  creatinine: 0.98   WBC: 3.7 Hb: 10.4 Platelet: 137  9/28>>Blood culture: No growth 9/28>>Urine Culture: Multiple species  9/28>> CT head: No acute intracranial pathology 9/28>> CT angio chest: No PE.  Ununited right clavicular fracture/subacute bilateral rib fractures. 9/28>> CT abdomen/pelvis: Left renal lesion unchanged since 2021-hemorrhagic/proteinaceous cyst, mild distention of the cecum  Assessment/Plan: COVID-19 infection: Remains stable on room air-no clinical signs of pneumonia-generalized weakness slowly improving-completed molnuparivir x5 days.  Generalized weakness/debility: Due to above-evaluated by PT/OT-recommendations of SNF on discharge.    Pancytopenia: Likely due to COVID-19 infection-mild-follow periodically.  Hypokalemia:  Repleted-recheck periodically.  Hypomagnesemia: Repleted-recheck periodically.  COPD: Stable-continue bronchodilators  HFpEF: Euvolemic-continue Lasix at daily dosing-change back to twice daily when more stable.   Nocturnal bradycardia: Asymptomatic-not on any rate control agents- TSH normal.   HTN: Controlled-continue Avapro and Lasix  HLD: Continue statin  Chronic atrial fibrillation: Rate controlled-on Eliquis  RLS: Continue transdermal Neupro-and as needed Sinemet.  Left renal lesion: Chronic issue-we will need outpatient renal ultrasound-we will defer to PCP  Distended cecum on CT scan: Unclear clinical significance-abdominal exam is benign.  Right little finger fracture: Splint in place-resume outpatient follow-up with orthopedics as previous.  Procedures: None Consults: None DVT Prophylaxis: Eliquis Code Status: DNR Family Communication: Sister-[phone] updated over the phone on 10/2  Time spent: 15 minutes-Greater than 50% of this time was spent in counseling, explanation of diagnosis, planning of further management, and coordination of care.  Diet: Diet Order             Diet heart healthy/carb modified Room service appropriate? No; Fluid consistency: Thin  Diet effective now                      Disposition Plan: Status is: Inpatient  The patient will require care spanning > 2 midnights and should be moved to inpatient because: Inpatient level of care appropriate due to severity of illness  Dispo: The patient is from: Home              Anticipated d/c is to: SNF vs HHPT              Patient currently is not medically stable to d/c.   Difficult to place patient No    Barriers to Discharge: COVID-19 infection with severe weakness/debility-needs SNF  on discharge.  Antimicrobial agents: Anti-infectives (From admission, onward)    Start     Dose/Rate Route Frequency Ordered Stop   08/29/21 2200  molnupiravir EUA (LAGEVRIO) capsule 800  mg        4 capsule Oral 2 times daily 08/29/21 1947 09/03/21 2159   08/29/21 1600  cefTRIAXone (ROCEPHIN) 2 g in sodium chloride 0.9 % 100 mL IVPB        2 g 200 mL/hr over 30 Minutes Intravenous  Once 08/29/21 1554 08/29/21 1757   08/29/21 1600  azithromycin (ZITHROMAX) 500 mg in sodium chloride 0.9 % 250 mL IVPB        500 mg 250 mL/hr over 60 Minutes Intravenous  Once 08/29/21 1554 08/29/21 1757        MEDICATIONS: Scheduled Meds:  apixaban  2.5 mg Oral BID   vitamin C  500 mg Oral Daily   ferrous sulfate  325 mg Oral Once per day on Mon Thu   furosemide  40 mg Oral Daily   irbesartan  300 mg Oral Daily   potassium chloride  40 mEq Oral Daily   rosuvastatin  40 mg Oral Daily   Rotigotine  3 mg Transdermal QHS   zinc sulfate  220 mg Oral Daily   Continuous Infusions:  PRN Meds:.acetaminophen, albuterol, bisacodyl, carbidopa-levodopa, HYDROcodone-acetaminophen, ondansetron **OR** ondansetron (ZOFRAN) IV, polyethylene glycol, polyvinyl alcohol, traZODone   I have personally reviewed following labs and imaging studies  LABORATORY DATA: CBC: Recent Labs  Lab 08/31/21 0118 09/01/21 0038 09/02/21 0128 09/04/21 0107  WBC 4.2 3.1* 2.6* 3.7*  NEUTROABS 3.0 1.8 1.1*  --   HGB 11.4* 10.8* 10.5* 10.4*  HCT 35.5* 33.2* 32.7* 33.0*  MCV 89.2 88.1 88.6 89.9  PLT 144* 127* 132* 137*     Basic Metabolic Panel: Recent Labs  Lab 08/31/21 0118 09/01/21 0038 09/02/21 0128 09/03/21 0038 09/04/21 0107  NA 136 134* 137 135 135  K 3.6 2.8* 3.3* 4.0 3.9  CL 98 97* 100 102 100  CO2 25 26 24 23 24   GLUCOSE 101* 104* 94 101* 122*  BUN 10 16 19  24* 26*  CREATININE 0.84 0.94 1.02* 1.19* 0.98  CALCIUM 8.2* 7.9* 8.2* 8.5* 8.7*  MG 2.2 1.9 1.8 2.5*  --      GFR: Estimated Creatinine Clearance: 36.6 mL/min (by C-G formula based on SCr of 0.98 mg/dL).  Liver Function Tests: Recent Labs  Lab 08/31/21 0118 09/01/21 0038 09/02/21 0128  AST 24 26 30   ALT 17 12 6   ALKPHOS  116 105 82  BILITOT 0.9 0.7 0.5  PROT 7.1 6.6 6.2*  ALBUMIN 3.3* 2.9* 2.8*    No results for input(s): LIPASE, AMYLASE in the last 168 hours. No results for input(s): AMMONIA in the last 168 hours.   Coagulation Profile: No results for input(s): INR, PROTIME in the last 168 hours.   Cardiac Enzymes: No results for input(s): CKTOTAL, CKMB, CKMBINDEX, TROPONINI in the last 168 hours.  BNP (last 3 results) No results for input(s): PROBNP in the last 8760 hours.  Lipid Profile: No results for input(s): CHOL, HDL, LDLCALC, TRIG, CHOLHDL, LDLDIRECT in the last 72 hours.  Thyroid Function Tests: Recent Labs    09/04/21 0107  TSH 1.157     Anemia Panel: No results for input(s): VITAMINB12, FOLATE, FERRITIN, TIBC, IRON, RETICCTPCT in the last 72 hours.   Urine analysis:    Component Value Date/Time   COLORURINE YELLOW 08/29/2021 1352   APPEARANCEUR CLEAR 08/29/2021 1352  LABSPEC 1.010 08/29/2021 1352   PHURINE 6.0 08/29/2021 1352   GLUCOSEU NEGATIVE 08/29/2021 1352   HGBUR NEGATIVE 08/29/2021 1352   BILIRUBINUR NEGATIVE 08/29/2021 1352   BILIRUBINUR Negative 02/15/2020 1052   KETONESUR NEGATIVE 08/29/2021 1352   PROTEINUR 30 (A) 08/29/2021 1352   UROBILINOGEN 0.2 02/15/2020 1052   UROBILINOGEN 0.2 08/20/2019 1732   NITRITE NEGATIVE 08/29/2021 1352   LEUKOCYTESUR NEGATIVE 08/29/2021 1352    Sepsis Labs: Lactic Acid, Venous    Component Value Date/Time   LATICACIDVEN 1.2 08/29/2021 1646    MICROBIOLOGY: Recent Results (from the past 240 hour(s))  Blood culture (routine single)     Status: None   Collection Time: 08/29/21  2:05 PM   Specimen: BLOOD  Result Value Ref Range Status   Specimen Description BLOOD RIGHT ANTECUBITAL  Final   Special Requests   Final    BOTTLES DRAWN AEROBIC AND ANAEROBIC Blood Culture adequate volume   Culture   Final    NO GROWTH 5 DAYS Performed at [facility], [address] [place], [place] [address]    Report Status  09/03/2021 FINAL  Final  Resp Panel by RT-PCR (Flu A&B, Covid) Nasopharyngeal Swab     Status: Abnormal   Collection Time: 08/29/21  2:05 PM   Specimen: Nasopharyngeal Swab; Nasopharyngeal(NP) swabs in vial transport medium  Result Value Ref Range Status   SARS Coronavirus 2 by RT PCR POSITIVE (A) NEGATIVE Final    Comment: RESULT CALLED TO, READ BACK BY AND VERIFIED WITH: RN E.BANKS AT 1609 ON 08/29/2021 BY T.SAAD. (NOTE) SARS-CoV-2 target nucleic acids are DETECTED.  The SARS-CoV-2 RNA is generally detectable in upper respiratory specimens during the acute phase of infection. Positive results are indicative of the presence of the identified virus, but do not rule out bacterial infection or co-infection with other pathogens not detected by the test. Clinical correlation with patient history and other diagnostic information is necessary to determine patient infection status. The expected result is Negative.  Fact Sheet for Patients: BloggerCourse.com  Fact Sheet for Healthcare Providers: SeriousBroker.it  This test is not yet approved or cleared by the [place] FDA and  has been authorized for detection and/or diagnosis of SARS-CoV-2 by FDA under an Emergency Use Authorization (EUA).  This EUA will remain in effect (meaning this t est can be used) for the duration of  the COVID-19 declaration under Section 564(b)(1) of the Act, 21 U.S.C. section 360bbb-3(b)(1), unless the authorization is terminated or revoked sooner.     Influenza A by PCR NEGATIVE NEGATIVE Final   Influenza B by PCR NEGATIVE NEGATIVE Final    Comment: (NOTE) The Xpert Xpress SARS-CoV-2/FLU/RSV plus assay is intended as an aid in the diagnosis of influenza from Nasopharyngeal swab specimens and should not be used as a sole basis for treatment. Nasal washings and aspirates are unacceptable for Xpert Xpress SARS-CoV-2/FLU/RSV testing.  Fact Sheet for  Patients: BloggerCourse.com  Fact Sheet for Healthcare Providers: SeriousBroker.it  This test is not yet approved or cleared by the [place] FDA and has been authorized for detection and/or diagnosis of SARS-CoV-2 by FDA under an Emergency Use Authorization (EUA). This EUA will remain in effect (meaning this test can be used) for the duration of the COVID-19 declaration under Section 564(b)(1) of the Act, 21 U.S.C. section 360bbb-3(b)(1), unless the authorization is terminated or revoked.  Performed at [facility], [address], [place], [place] [address]   Urine Culture     Status:  Abnormal   Collection Time: 08/29/21  2:20 PM   Specimen: In/Out Cath Urine  Result Value Ref Range Status   Specimen Description IN/OUT CATH URINE  Final   Special Requests   Final    NONE Performed at [facility], [address] [place], [place] [address]    Culture MULTIPLE SPECIES PRESENT, SUGGEST RECOLLECTION (A)  Final   Report Status 08/30/2021 FINAL  Final    RADIOLOGY STUDIES/RESULTS: No results found.   LOS: 7 days   Jeoffrey Massed, MD  [facility]    To contact the attending provider between 7A-7P or the covering provider during after hours 7P-7A, please log into the web site [email or web address] and access using universal [place] password for that web site. If you do not have the password, please call the hospital operator.  09/06/2021, 12:36 PM

## 2021-09-06 NOTE — TOC Progression Note (Cosign Needed)
Transition of Care [facility]) - Progression Note    Patient Details  Name: Cheryl Atkinson MRN: [id number] Date of Birth: [birth year]  [facility] CM/SW Contact  Xue Low Colman Cater, Student-Social Work Phone Number: 09/06/2021, 3:25 PM  Clinical Narrative:    CSW intern provided patients sister [name] POA [phone]) with SNF bed offers.  Expected Discharge Plan: Skilled Nursing Facility Barriers to Discharge: Continued Medical Work up, SNF Covid Recovering  Expected Discharge Plan and Services Expected Discharge Plan: Skilled Nursing Facility   Discharge Planning Services: CM Consult   Living arrangements for the past 2 months: Single Family Home                                       Social Determinants of Health (SDOH) Interventions    Readmission Risk Interventions No flowsheet data found.

## 2021-09-06 NOTE — Progress Notes (Signed)
Pt denies pain, reports fullness in her belly, requests something to help her bowels move, prn to be provided.

## 2021-09-06 NOTE — Progress Notes (Signed)
Physical Therapy Treatment Patient Details Name: Cheryl Atkinson MRN: [id number] DOB: 08/18/1931 Today's Date: 09/06/2021   History of Present Illness Pt is an 87 y/o female admitted 9/28 secondary to increased weakness. Found to be Covid+. PMH includes HTN, a fib, COPD, CHF.    PT Comments    Patient recently back to bed via stedy (per pt report) and session therefore focused on LE strengthening and bil heel cord ROM. Pt cooperative with all exercises.     Recommendations for follow up therapy are one component of a multi-disciplinary discharge planning process, led by the attending physician.  Recommendations may be updated based on patient status, additional functional criteria and insurance authorization.  Follow Up Recommendations  SNF     Equipment Recommendations  None recommended by PT    Recommendations for Other Services       Precautions / Restrictions Precautions Precautions: Fall Restrictions Other Position/Activity Restrictions: pt with prefab R wrist splint, reports from injury sustained during fall at home. Pt denies any current restrictions to RUE (ie weight-bearing).     Mobility  Bed Mobility Overal bed mobility: Needs Assistance Bed Mobility: Rolling Rolling: Mod assist         General bed mobility comments: pt recently back to bed by nursing using stedy (per pt report)    Transfers                    Ambulation/Gait             General Gait Details: unable   Stairs             Wheelchair Mobility    Modified Rankin (Stroke Patients Only)       Balance                                            Cognition Arousal/Alertness: Awake/alert Behavior During Therapy: Flat affect Overall Cognitive Status: No family/caregiver present to determine baseline cognitive functioning                                 General Comments: following commands for LE exercises      Exercises General  Exercises - Lower Extremity Ankle Circles/Pumps: AROM;Both;10 reps Heel Slides: Strengthening;10 reps (resisted extension) Hip ABduction/ADduction: AROM;Both;10 reps;Supine;Sidelying (x 10 in each position) Straight Leg Raises: AROM;Both;10 reps Other Exercises Other Exercises: supine bil heel cord stretching x 20 second holds (in both knee flexion and knee extension) Bridging x 10 reps    General Comments        Pertinent Vitals/Pain Pain Assessment: Faces Faces Pain Scale: No hurt    Home Living                      Prior Function            PT Goals (current goals can now be found in the care plan section) Acute Rehab PT Goals Patient Stated Goal: to go home PT Goal Formulation: With patient Time For Goal Achievement: 09/13/21 Potential to Achieve Goals: Good Progress towards PT goals: Progressing toward goals    Frequency    Min 2X/week      PT Plan Current plan remains appropriate;Frequency needs to be updated    Co-evaluation  AM-PAC PT "6 Clicks" Mobility   Outcome Measure  Help needed turning from your back to your side while in a flat bed without using bedrails?: A Lot Help needed moving from lying on your back to sitting on the side of a flat bed without using bedrails?: A Lot Help needed moving to and from a bed to a chair (including a wheelchair)?: Total Help needed standing up from a chair using your arms (e.g., wheelchair or bedside chair)?: Total Help needed to walk in hospital room?: Total Help needed climbing 3-5 steps with a railing? : Total 6 Click Score: 8    End of Session   Activity Tolerance: Patient limited by fatigue Patient left: in bed;with call bell/phone within reach;with bed alarm set   PT Visit Diagnosis: Unsteadiness on feet (R26.81);Muscle weakness (generalized) (M62.81);Difficulty in walking, not elsewhere classified (R26.2)     Time: 0102-7253 PT Time Calculation (min) (ACUTE ONLY): 24  min  Charges:  $Therapeutic Exercise: 23-37 mins                      Jerolyn Center, PT Pager [phone]    Zena Amos 09/06/2021, 3:09 PM

## 2021-09-07 LAB — BASIC METABOLIC PANEL
Anion gap: 8 (ref 5–15)
BUN: 33 mg/dL — ABNORMAL HIGH (ref 8–23)
CO2: 24 mmol/L (ref 22–32)
Calcium: 9.2 mg/dL (ref 8.9–10.3)
Chloride: 104 mmol/L (ref 98–111)
Creatinine, Ser: 1.29 mg/dL — ABNORMAL HIGH (ref 0.44–1.00)
GFR, Estimated: 40 mL/min — ABNORMAL LOW (ref >60)
Glucose, Bld: 104 mg/dL — ABNORMAL HIGH (ref 70–99)
Potassium: 4.9 mmol/L (ref 3.5–5.1)
Sodium: 136 mmol/L (ref 135–145)

## 2021-09-07 MED ORDER — APIXABAN 2.5 MG PO TABS: 2.5000 mg | ORAL_TABLET | Freq: Two times a day (BID) | ORAL | Status: DC

## 2021-09-07 MED ORDER — FUROSEMIDE 40 MG PO TABS: 20.0000 mg | ORAL_TABLET | Freq: Every day | ORAL | 3 refills | Status: DC

## 2021-09-07 MED ORDER — POTASSIUM CHLORIDE CRYS ER 20 MEQ PO TBCR
20.0000 meq | EXTENDED_RELEASE_TABLET | Freq: Every day | ORAL | Status: DC
  Administered 2021-09-08: 20 meq via ORAL
  Filled 2021-09-07: qty 1

## 2021-09-07 MED ORDER — POTASSIUM CHLORIDE CRYS ER 10 MEQ PO TBCR: 10.0000 meq | EXTENDED_RELEASE_TABLET | Freq: Two times a day (BID) | ORAL | Status: DC | PRN

## 2021-09-07 NOTE — Discharge Summary (Signed)
PATIENT DETAILS Name: Cheryl Atkinson Age: 87 y.o. Sex: female Date of Birth: 1931/04/20 MRN: [id number]. Admitting Physician: [name], MD [name], Cheryl Contes, MD  Admit Date: 08/29/2021 Discharge date: 09/08/21  Recommendations for Outpatient Follow-up:  Follow up with PCP in 1-2 weeks Please obtain CMP/CBC in one week Please have palliative care follow patient while at SNF  Admitted From:  Home  Disposition: SNF   Home Health: No  Equipment/Devices: None  Discharge Condition: Stable/  CODE STATUS:  DNR  Diet recommendation:  Diet Order             Diet - low sodium heart healthy           Diet heart healthy/carb modified Room service appropriate? No; Fluid consistency: Thin  Diet effective now                    Brief Summary: Patient is a 87 y.o. female with history of HTN, HLD, HFpEF, RLS, PAF, COPD-who presents with 2-day history of poor oral intake/intermittent nausea-and generalized weakness-she was found to be febrile on initial presentation-and subsequent work-up revealed COVID-19 infection.  See below for further details.   Pertinent labs/radiology: 9/28>>Blood culture: No growth 9/28>>Urine Culture: Multiple species 9/28>> CT head: No acute intracranial pathology 9/28>> CT angio chest: No PE.  Ununited right clavicular fracture/subacute bilateral rib fractures. 9/28>> CT abdomen/pelvis: Left renal lesion unchanged since 2021-hemorrhagic/proteinaceous cyst, mild distention of the cecum  Brief Hospital Course: COVID-19 infection: Remains stable on room air-no clinical signs of pneumonia-generalized weakness slowly improving-completed molnuparivir x5 days.   Generalized weakness/debility: Due to above-evaluated by PT/OT-recommendations of SNF on discharge.     Pancytopenia: Likely due to COVID-19 infection-mild-follow periodically.   Hypokalemia: Repleted-recheck periodically.   Hypomagnesemia: Repleted-recheck periodically.    COPD: Stable-continue bronchodilators   HFpEF: Euvolemic-continue reduced dose Lasix at daily dosing-change back to usual twice daily when able.    Nocturnal bradycardia: Asymptomatic-not on any rate control agents- TSH normal.    HTN: Controlled-continue Avapro and Lasix   HLD: Continue statin   Chronic atrial fibrillation: Rate controlled-on Eliquis   RLS: Continue transdermal Neupro-and as needed Sinemet.   Left renal lesion: Chronic issue-we will need outpatient renal ultrasound-we will defer to PCP   Distended cecum on CT scan: Unclear clinical significance-abdominal exam is benign.   Right little finger fracture: Splint in place-resume outpatient follow-up with orthopedics as previous.   RN pressure injury documentation: Pressure Injury 05/27/20 Heel Left Unstageable - Full thickness tissue loss in which the base of the injury is covered by slough (yellow, tan, gray, green or brown) and/or eschar (tan, brown or black) in the wound bed. (Active)  05/27/20 2215  Location: Heel  Location Orientation: Left  Staging: Unstageable - Full thickness tissue loss in which the base of the injury is covered by slough (yellow, tan, gray, green or brown) and/or eschar (tan, brown or black) in the wound bed.  Wound Description (Comments):   Present on Admission: Yes    Procedures None  Discharge Diagnoses:  Principal Problem:   COVID-19 virus infection Active Problems:   Iron deficiency anemia   RESTLESS LEG SYNDROME   Essential hypertension   COPD (chronic obstructive pulmonary disease) (HCC)   GERD   Hyperlipidemia   Hypokalemia   Atrial fibrillation (HCC)   Heart failure with preserved ejection fraction (HCC)   Renal lesion   COVID-19   Discharge Instructions:  Activity:  As tolerated with Full fall precautions use  walker/cane & assistance as needed  Discharge Instructions     Call MD for:  difficulty breathing, headache or visual disturbances   Complete by: As  directed    Diet - low sodium heart healthy   Complete by: As directed    Increase activity slowly   Complete by: As directed       Allergies as of 09/07/2021       Reactions   Codeine    hallucinations        Medication List     STOP taking these medications    amLODipine 5 MG tablet Commonly known as: NORVASC   HYDROcodone-acetaminophen 5-325 MG tablet Commonly known as: Norco   traMADol 50 MG tablet Commonly known as: ULTRAM       TAKE these medications    apixaban 2.5 MG Tabs tablet Commonly known as: Eliquis Take 1 tablet (2.5 mg total) by mouth 2 (two) times daily. What changed:  medication strength how much to take   CALCIUM PO Take by mouth.   carbidopa-levodopa 25-100 MG tablet Commonly known as: SINEMET IR Take daily as needed for worsening RLS.   ferrous sulfate 325 (65 FE) MG tablet Take 325 mg by mouth 2 (two) times a week. Patient takes one tablet on Monday and Friday   furosemide 40 MG tablet Commonly known as: LASIX Take 0.5 tablets (20 mg total) by mouth daily. What changed:  how much to take when to take this   irbesartan 300 MG tablet Commonly known as: AVAPRO Take 1 tablet (300 mg total) by mouth daily.   Linzess 145 MCG Caps capsule Generic drug: linaclotide Take 145 mcg by mouth daily as needed (constipation).   Neupro 3 MG/24HR Pt24 Generic drug: Rotigotine PLACE 1 PATCH (3 MG) ONTO THE SKIN AT BEDTIME What changed:  how much to take how to take this when to take this additional instructions   polyethylene glycol 17 g packet Commonly known as: MIRALAX / GLYCOLAX Take 17 g by mouth 2 (two) times daily. Reported on 03/14/2016 What changed:  when to take this reasons to take this additional instructions   potassium chloride 10 MEQ tablet Commonly known as: KLOR-CON Take 1 tablet (10 mEq total) by mouth 2 (two) times daily as needed. Needs to be taken  With each dose of lasix What changed:  medication  strength how much to take   PRESERVISION AREDS 2 PO Take 1 tablet by mouth 2 (two) times daily.   ProAir RespiClick 108 (90 Base) MCG/ACT Aepb Generic drug: Albuterol Sulfate Inhale 2 puffs into the lungs every 6 (six) hours as needed (shortness of breath from COPD).   rosuvastatin 40 MG tablet Commonly known as: CRESTOR Take 1 tablet (40 mg total) by mouth daily. Take 1 tablet by mouth daily.   traZODone 50 MG tablet Commonly known as: DESYREL Take 1.5 tablets (75 mg total) by mouth at bedtime as needed for sleep.   vitamin B-12 1000 MCG tablet Commonly known as: CYANOCOBALAMIN Take 1,000 mcg by mouth daily.   VITAMIN D3 PO Take by mouth.        Follow-up Information     [facility] Follow up.   Specialty: Home Health Services Why: Active for RN, OT, PT Contact information: [address] [place] [address] [phone]                Allergies  Allergen Reactions   Codeine     hallucinations  Consultations:  None   Other Procedures/Studies: CT HEAD WO CONTRAST ( )  Result Date: 08/29/2021 CLINICAL DATA:  Headaches EXAM: CT HEAD WITHOUT CONTRAST TECHNIQUE: Contiguous axial images were obtained from the base of the skull through the vertex without intravenous contrast. COMPARISON:  None. FINDINGS: Brain: No evidence of acute infarction, hemorrhage, extra-axial collection, ventriculomegaly, or mass effect. Generalized cerebral atrophy. Periventricular white matter low attenuation likely secondary to microangiopathy. Vascular: Cerebrovascular atherosclerotic calcifications are noted. Skull: Negative for fracture or focal lesion. Sinuses/Orbits: Visualized portions of the orbits are unremarkable. Visualized portions of the paranasal sinuses are unremarkable. Visualized portions of the mastoid air cells are unremarkable. Other: None. IMPRESSION: No acute intracranial pathology. Electronically Signed   By: Elige Ko  M.D.   On: 08/29/2021 17:01   CT Angio Chest PE W and/or Wo Contrast  Result Date: 08/29/2021 CLINICAL DATA:  Weakness, headache with nausea vomiting and shortness of breath in an 87 year old female. EXAM: CT ANGIOGRAPHY CHEST CT ABDOMEN AND PELVIS WITH CONTRAST TECHNIQUE: Multidetector CT imaging of the chest was performed using the standard protocol during bolus administration of intravenous contrast. Multiplanar CT image reconstructions and MIPs were obtained to evaluate the vascular anatomy. Multidetector CT imaging of the abdomen and pelvis was performed using the standard protocol during bolus administration of intravenous contrast. CONTRAST:  48mL OMNIPAQUE IOHEXOL 350 MG/ML SOLN COMPARISON:  Clavicular x-rays of August 15, 2021. Abdomen pelvis CT from May 16, 2020. FINDINGS: CTA CHEST FINDINGS Cardiovascular: Calcified aortic atherosclerosis without aneurysmal dilation of the thoracic aorta. Calcification and soft plaque extending into branch vessels in the chest. Limited assessment due to bolus timing. Cardiac size with moderate to marked cardiac enlargement. Three-vessel coronary artery disease. No substantial pericardial effusion. Central pulmonary arteries with density of 620 Hounsfield units. Mildly limited assessment at the lung bases. No signs of pulmonary embolism to the segmental level at the lung bases or elsewhere in the chest. Mediastinum/Nodes: No signs of adenopathy in the chest. Esophagus grossly normal by CT. Lungs/Pleura: No pneumothorax, mild basilar atelectasis. Small layering bilateral effusions. Airways are patent. Musculoskeletal: Ununited RIGHT clavicular fracture shows a similar appearance to imaging from August 15, 2021. Chondroid lesion in the RIGHT proximal humerus. Osteopenia. Subacute rib fractures along the LEFT chest involving the third, fourth, fifth anterior ribs. Displaced subacute fractures of LEFT-sided ribs involving LEFT second, third and fourth ribs as well  as the fifth rib without displacement. Spinal degenerative changes about the thoracic spine. Healed fractures of the sternum Review of the MIP images confirms the above findings. CT ABDOMEN and PELVIS FINDINGS Hepatobiliary: Liver with smooth contours. No focal, suspicious hepatic lesion. Post cholecystectomy with patent portal vein. Biliary duct dilation, common bile duct approximately 9 mm. No substantial intrahepatic biliary duct distension. No perihepatic stranding or fluid. Pancreas: Signs of pancreatic atrophy. No visible inflammation or lesion. Atrophy more pronounced in the head of the pancreas. No ductal dilation. Spleen: Normal. Adrenals/Urinary Tract: Small renal lesion unchanged since 2021 arises from the lateral LEFT kidney measuring approximately 11 mm and 60 Hounsfield units. Numerous bladder diverticuli.  No perivesical stranding. No hydronephrosis. No suspicious renal lesion on the RIGHT. Stomach/Bowel: No acute gastrointestinal process. Colonic diverticulosis. Appendix not visualized. No pericecal inflammation. Cecum displays a position that oriented more towards the upper abdomen. Maximal caliber approximately 7 cm. No pericecal stranding. No twist in the colon. Mild distortion of the mesentery without frank twisting. Approximally 180 degree rotation of distal small bowel mesentery. Vascular/Lymphatic: Aortic atherosclerosis. No sign of aneurysm. Smooth  contour of the IVC. There is no gastrohepatic or hepatoduodenal ligament lymphadenopathy. No retroperitoneal or mesenteric lymphadenopathy. No pelvic sidewall lymphadenopathy. .2.5 cm maximal caliber of the infrarenal abdominal aorta is unchanged since previous imaging. Reproductive: Post hysterectomy, no adnexal mass. Other: No free air.  No sign of ascites. Musculoskeletal: Osteopenia. Spinal degenerative changes. Interval near complete loss of height of the L2 vertebral body since the prior CT study from May 16, 2020, surrounding degenerative  changes implant chronicity, in fact this was shown to be fractured on January 27, 2021 but shows further loss of height since that time previously approximately 50% loss of height now approximately 80% loss of height. Bony retropulsion at this level has increased since previous imaging, proximally 6-7 mm bony retropulsion at the level of the L2 fracture. Interval worsening loss of height is age indeterminate and there is moderate central canal narrowing associated with this finding. Review of the MIP images confirms the above findings. IMPRESSION: No signs of pulmonary embolism limited at the subsegmental level in the lung bases. Small layering bilateral effusions. Moderate to marked cardiac enlargement. Three-vessel coronary artery disease. Worsening loss of height of the L2 vertebral level with substantial surrounding degenerative change. Now with approximately 80% loss of height and moderate canal narrowing due to retropulsion of posterior cortical elements. Correlate with any acute pain in this area with further imaging as warranted. Small LEFT renal lesion unchanged since studies from 2021, potentially hemorrhagic or proteinaceous cyst, small indolent renal neoplasm is possible, f/u renal sonogram is suggested when the patient is able. Mild distension of the cecum with mid abdominal position without signs of twist of the bowel. No frank mesenteric twist. This could be due to a mobile cecum which can have an association with cecal volvulus, no current evidence. If there is worsening of abdominal pain could consider repeat imaging as warranted. Numerous bladder diverticuli. Ununited RIGHT clavicular fracture and subacute bilateral rib fractures. Electronically Signed   By: Donzetta Kohut M.D.   On: 08/29/2021 17:34   CT ABDOMEN PELVIS W CONTRAST  Result Date: 08/29/2021 CLINICAL DATA:  Weakness, headache with nausea vomiting and shortness of breath in an 87 year old female. EXAM: CT ANGIOGRAPHY CHEST CT  ABDOMEN AND PELVIS WITH CONTRAST TECHNIQUE: Multidetector CT imaging of the chest was performed using the standard protocol during bolus administration of intravenous contrast. Multiplanar CT image reconstructions and MIPs were obtained to evaluate the vascular anatomy. Multidetector CT imaging of the abdomen and pelvis was performed using the standard protocol during bolus administration of intravenous contrast. CONTRAST:  36mL OMNIPAQUE IOHEXOL 350 MG/ML SOLN COMPARISON:  Clavicular x-rays of August 15, 2021. Abdomen pelvis CT from May 16, 2020. FINDINGS: CTA CHEST FINDINGS Cardiovascular: Calcified aortic atherosclerosis without aneurysmal dilation of the thoracic aorta. Calcification and soft plaque extending into branch vessels in the chest. Limited assessment due to bolus timing. Cardiac size with moderate to marked cardiac enlargement. Three-vessel coronary artery disease. No substantial pericardial effusion. Central pulmonary arteries with density of 620 Hounsfield units. Mildly limited assessment at the lung bases. No signs of pulmonary embolism to the segmental level at the lung bases or elsewhere in the chest. Mediastinum/Nodes: No signs of adenopathy in the chest. Esophagus grossly normal by CT. Lungs/Pleura: No pneumothorax, mild basilar atelectasis. Small layering bilateral effusions. Airways are patent. Musculoskeletal: Ununited RIGHT clavicular fracture shows a similar appearance to imaging from August 15, 2021. Chondroid lesion in the RIGHT proximal humerus. Osteopenia. Subacute rib fractures along the LEFT chest involving the third, fourth,  fifth anterior ribs. Displaced subacute fractures of LEFT-sided ribs involving LEFT second, third and fourth ribs as well as the fifth rib without displacement. Spinal degenerative changes about the thoracic spine. Healed fractures of the sternum Review of the MIP images confirms the above findings. CT ABDOMEN and PELVIS FINDINGS Hepatobiliary: Liver with  smooth contours. No focal, suspicious hepatic lesion. Post cholecystectomy with patent portal vein. Biliary duct dilation, common bile duct approximately 9 mm. No substantial intrahepatic biliary duct distension. No perihepatic stranding or fluid. Pancreas: Signs of pancreatic atrophy. No visible inflammation or lesion. Atrophy more pronounced in the head of the pancreas. No ductal dilation. Spleen: Normal. Adrenals/Urinary Tract: Small renal lesion unchanged since 2021 arises from the lateral LEFT kidney measuring approximately 11 mm and 60 Hounsfield units. Numerous bladder diverticuli.  No perivesical stranding. No hydronephrosis. No suspicious renal lesion on the RIGHT. Stomach/Bowel: No acute gastrointestinal process. Colonic diverticulosis. Appendix not visualized. No pericecal inflammation. Cecum displays a position that oriented more towards the upper abdomen. Maximal caliber approximately 7 cm. No pericecal stranding. No twist in the colon. Mild distortion of the mesentery without frank twisting. Approximally 180 degree rotation of distal small bowel mesentery. Vascular/Lymphatic: Aortic atherosclerosis. No sign of aneurysm. Smooth contour of the IVC. There is no gastrohepatic or hepatoduodenal ligament lymphadenopathy. No retroperitoneal or mesenteric lymphadenopathy. No pelvic sidewall lymphadenopathy. .2.5 cm maximal caliber of the infrarenal abdominal aorta is unchanged since previous imaging. Reproductive: Post hysterectomy, no adnexal mass. Other: No free air.  No sign of ascites. Musculoskeletal: Osteopenia. Spinal degenerative changes. Interval near complete loss of height of the L2 vertebral body since the prior CT study from May 16, 2020, surrounding degenerative changes implant chronicity, in fact this was shown to be fractured on January 27, 2021 but shows further loss of height since that time previously approximately 50% loss of height now approximately 80% loss of height. Bony retropulsion  at this level has increased since previous imaging, proximally 6-7 mm bony retropulsion at the level of the L2 fracture. Interval worsening loss of height is age indeterminate and there is moderate central canal narrowing associated with this finding. Review of the MIP images confirms the above findings. IMPRESSION: No signs of pulmonary embolism limited at the subsegmental level in the lung bases. Small layering bilateral effusions. Moderate to marked cardiac enlargement. Three-vessel coronary artery disease. Worsening loss of height of the L2 vertebral level with substantial surrounding degenerative change. Now with approximately 80% loss of height and moderate canal narrowing due to retropulsion of posterior cortical elements. Correlate with any acute pain in this area with further imaging as warranted. Small LEFT renal lesion unchanged since studies from 2021, potentially hemorrhagic or proteinaceous cyst, small indolent renal neoplasm is possible, f/u renal sonogram is suggested when the patient is able. Mild distension of the cecum with mid abdominal position without signs of twist of the bowel. No frank mesenteric twist. This could be due to a mobile cecum which can have an association with cecal volvulus, no current evidence. If there is worsening of abdominal pain could consider repeat imaging as warranted. Numerous bladder diverticuli. Ununited RIGHT clavicular fracture and subacute bilateral rib fractures. Electronically Signed   By: Donzetta Kohut M.D.   On: 08/29/2021 17:34   DG Chest Port 1 View  Result Date: 08/29/2021 CLINICAL DATA:  Questionable sepsis EXAM: PORTABLE CHEST 1 VIEW COMPARISON:  01/14/2021 FINDINGS: Cardiomegaly. No confluent opacities, effusions or edema. No acute bony abnormality. IMPRESSION: Cardiomegaly.  No active disease. Electronically Signed  By: Charlett Nose M.D.   On: 08/29/2021 14:33   XR Finger Little Right  Result Date: 08/16/2021 X-rays demonstrate slight bony  consolidation to the proximal phalanx fracture of the small finger.  There also appears to be a fracture to the proximal phalanx of the ring finger.  XR Clavicle Right  Result Date: 08/16/2021 X-rays demonstrate stable alignment of the fracture with bony consolidation    TODAY-DAY OF DISCHARGE:  Subjective:   Cheryl Atkinson today has no headache,no chest abdominal pain,no new weakness tingling or numbness, feels much better wants to go home today.   Objective:   Blood pressure 123/64, pulse 68, temperature 98.5 F (36.9 C), temperature source Oral, resp. rate 18, height  (1.702 m), weight 57.2 kg, SpO2 98 %.  Intake/Output Summary (Last 24 hours) at 09/07/2021 1416 Last data filed at 09/07/2021 0016 Gross per 24 hour  Intake 120 ml  Output 550 ml  Net -430 ml   Filed Weights   09/02/21 0423 09/06/21 0500 09/07/21 0600  Weight: 57.5 kg 58.5 kg 57.2 kg    Exam: Awake Alert, Oriented *3, No new F.N deficits, Normal affect White.AT,PERRAL Supple Neck,No JVD, No cervical lymphadenopathy appriciated.  Symmetrical Chest wall movement, Good air movement bilaterally, CTAB RRR,No Gallops,Rubs or new Murmurs, No Parasternal Heave +ve B.Sounds, Abd Soft, Non tender, No organomegaly appriciated, No rebound -guarding or rigidity. No Cyanosis, Clubbing or edema, No new Rash or bruise   PERTINENT RADIOLOGIC STUDIES: No results found.   PERTINENT LAB RESULTS: CBC: No results for input(s): WBC, HGB, HCT, PLT in the last 72 hours. CMET CMP     Component Value Date/Time   NA 136 09/07/2021 0152   K 4.9 09/07/2021 0152   CL 104 09/07/2021 0152   CO2 24 09/07/2021 0152   GLUCOSE 104 (H) 09/07/2021 0152   BUN 33 (H) 09/07/2021 0152   CREATININE 1.29 (H) 09/07/2021 0152   CREATININE 0.71 06/27/2020 0947   CALCIUM 9.2 09/07/2021 0152   PROT 6.2 (L) 09/02/2021 0128   PROT 6.9 05/25/2018 0955   ALBUMIN 2.8 (L) 09/02/2021 0128   ALBUMIN 4.2 05/25/2018 0955   AST 30 09/02/2021 0128    ALT 6 09/02/2021 0128   ALKPHOS 82 09/02/2021 0128   BILITOT 0.5 09/02/2021 0128   BILITOT 0.4 05/25/2018 0955   GFRNONAA 40 (L) 09/07/2021 0152   GFRAA >60 05/31/2020 0316    GFR Estimated Creatinine Clearance: 27.2 mL/min (A) (by C-G formula based on SCr of 1.29 mg/dL (H)). No results for input(s): LIPASE, AMYLASE in the last 72 hours. No results for input(s): CKTOTAL, CKMB, CKMBINDEX, TROPONINI in the last 72 hours. Invalid input(s): POCBNP No results for input(s): DDIMER in the last 72 hours. No results for input(s): HGBA1C in the last 72 hours. No results for input(s): CHOL, HDL, LDLCALC, TRIG, CHOLHDL, LDLDIRECT in the last 72 hours. No results for input(s): TSH, T4TOTAL, T3FREE, THYROIDAB in the last 72 hours.  Invalid input(s): FREET3 No results for input(s): VITAMINB12, FOLATE, FERRITIN, TIBC, IRON, RETICCTPCT in the last 72 hours. Coags: No results for input(s): INR in the last 72 hours.  Invalid input(s): PT Microbiology: Recent Results (from the past 240 hour(s))  Blood culture (routine single)     Status: None   Collection Time: 08/29/21  2:05 PM   Specimen: BLOOD  Result Value Ref Range Status   Specimen Description BLOOD RIGHT ANTECUBITAL  Final   Special Requests   Final    BOTTLES DRAWN AEROBIC AND  ANAEROBIC Blood Culture adequate volume   Culture   Final    NO GROWTH 5 DAYS Performed at [facility], [address] [place], [place] [address]    Report Status 09/03/2021 FINAL  Final  Resp Panel by RT-PCR (Flu A&B, Covid) Nasopharyngeal Swab     Status: Abnormal   Collection Time: 08/29/21  2:05 PM   Specimen: Nasopharyngeal Swab; Nasopharyngeal(NP) swabs in vial transport medium  Result Value Ref Range Status   SARS Coronavirus 2 by RT PCR POSITIVE (A) NEGATIVE Final    Comment: RESULT CALLED TO, READ BACK BY AND VERIFIED WITH: RN E.BANKS AT 1609 ON 08/29/2021 BY T.SAAD. (NOTE) SARS-CoV-2 target nucleic acids are DETECTED.  The SARS-CoV-2 RNA  is generally detectable in upper respiratory specimens during the acute phase of infection. Positive results are indicative of the presence of the identified virus, but do not rule out bacterial infection or co-infection with other pathogens not detected by the test. Clinical correlation with patient history and other diagnostic information is necessary to determine patient infection status. The expected result is Negative.  Fact Sheet for Patients: BloggerCourse.com  Fact Sheet for Healthcare Providers: SeriousBroker.it  This test is not yet approved or cleared by the [place] FDA and  has been authorized for detection and/or diagnosis of SARS-CoV-2 by FDA under an Emergency Use Authorization (EUA).  This EUA will remain in effect (meaning this t est can be used) for the duration of  the COVID-19 declaration under Section 564(b)(1) of the Act, 21 U.S.C. section 360bbb-3(b)(1), unless the authorization is terminated or revoked sooner.     Influenza A by PCR NEGATIVE NEGATIVE Final   Influenza B by PCR NEGATIVE NEGATIVE Final    Comment: (NOTE) The Xpert Xpress SARS-CoV-2/FLU/RSV plus assay is intended as an aid in the diagnosis of influenza from Nasopharyngeal swab specimens and should not be used as a sole basis for treatment. Nasal washings and aspirates are unacceptable for Xpert Xpress SARS-CoV-2/FLU/RSV testing.  Fact Sheet for Patients: BloggerCourse.com  Fact Sheet for Healthcare Providers: SeriousBroker.it  This test is not yet approved or cleared by the [place] FDA and has been authorized for detection and/or diagnosis of SARS-CoV-2 by FDA under an Emergency Use Authorization (EUA). This EUA will remain in effect (meaning this test can be used) for the duration of the COVID-19 declaration under Section 564(b)(1) of the Act, 21 U.S.C. section 360bbb-3(b)(1),  unless the authorization is terminated or revoked.  Performed at [facility], [address] [place], [place] [address]   Urine Culture     Status: Abnormal   Collection Time: 08/29/21  2:20 PM   Specimen: In/Out Cath Urine  Result Value Ref Range Status   Specimen Description IN/OUT CATH URINE  Final   Special Requests   Final    NONE Performed at [facility], [address], [place], [place] [address]    Culture MULTIPLE SPECIES PRESENT, SUGGEST RECOLLECTION (A)  Final   Report Status 08/30/2021 FINAL  Final    FURTHER DISCHARGE INSTRUCTIONS:  Get Medicines reviewed and adjusted: Please take all your medications with you for your next visit with your Primary MD  Laboratory/radiological data: Please request your Primary MD to go over all hospital tests and procedure/radiological results at the follow up, please ask your Primary MD to get all Hospital records sent to his/her office.  In some cases, they will be blood work, cultures and biopsy results pending at the time of  your discharge. Please request that your primary care M.D. goes through all the records of your hospital data and follows up on these results.  Also Note the following: If you experience worsening of your admission symptoms, develop shortness of breath, life threatening emergency, suicidal or homicidal thoughts you must seek medical attention immediately by calling 911 or calling your MD immediately  if symptoms less severe.  You must read complete instructions/literature along with all the possible adverse reactions/side effects for all the Medicines you take and that have been prescribed to you. Take any new Medicines after you have completely understood and accpet all the possible adverse reactions/side effects.   Do not drive when taking Pain medications or sleeping medications (Benzodaizepines)  Do not take more than prescribed Pain, Sleep and Anxiety Medications. It is not advisable to  combine anxiety,sleep and pain medications without talking with your primary care practitioner  Special Instructions: If you have smoked or chewed Tobacco  in the last 2 yrs please stop smoking, stop any regular Alcohol  and or any Recreational drug use.  Wear Seat belts while driving.  Please note: You were cared for by a hospitalist during your hospital stay. Once you are discharged, your primary care physician will handle any further medical issues. Please note that NO REFILLS for any discharge medications will be authorized once you are discharged, as it is imperative that you return to your primary care physician (or establish a relationship with a primary care physician if you do not have one) for your post hospital discharge needs so that they can reassess your need for medications and monitor your lab values.  Total Time spent coordinating discharge including counseling, education and face to face time equals 35 minutes.  Signed: Oluwaferanmi Wain 09/07/2021 2:16 PM

## 2021-09-07 NOTE — TOC Progression Note (Signed)
Transition of Care [facility]) - Progression Note    Patient Details  Name: Cheryl Atkinson MRN: [id number] Date of Birth: [birth year]  Transition of Care [facility]) CM/SW Contact  Carley Hammed, [place] Phone Number: 09/07/2021, 11:49 AM  Clinical Narrative:     CSW confirmed that [facility] will be able to admit pt on 10/8. DNR is on the chart, pt will go to rm# 210. TOC will continue to follow for DC needs.  Expected Discharge Plan: Skilled Nursing Facility Barriers to Discharge: Continued Medical Work up, SNF Covid Recovering  Expected Discharge Plan and Services Expected Discharge Plan: Skilled Nursing Facility   Discharge Planning Services: CM Consult   Living arrangements for the past 2 months: Single Family Home                                       Social Determinants of Health (SDOH) Interventions    Readmission Risk Interventions No flowsheet data found.

## 2021-09-07 NOTE — Progress Notes (Signed)
[facility] [phone] [facility]) Hospital Liaison note:  This patient is currently enrolled in [facility] outpatient-based Palliative Care. Will continue to follow for disposition.  Please call with any outpatient palliative questions or concerns.  Thank you, Abran Cantor, LPN [facility] Liaison [phone]

## 2021-09-07 NOTE — Progress Notes (Signed)
Occupational Therapy Treatment Patient Details Name: Cheryl Atkinson MRN: [id number] DOB: 1931-06-03 Today's Date: 09/07/2021   History of present illness Pt is an 87 y/o female admitted 9/28 secondary to increased weakness. Found to be Covid+. PMH includes HTN, a fib, COPD, CHF.   OT comments  Pt progressing towards acute OT goals. Focus of session was working on toilet transfer goal. Pt completed squat-pivot from EOB to recliner with max A. Encouraged incentive spirometer use. Unable to get accurate O2 reading during session with poor pleth. D/c plan remains appropriate.   Recommendations for follow up therapy are one component of a multi-disciplinary discharge planning process, led by the attending physician.  Recommendations may be updated based on patient status, additional functional criteria and insurance authorization.    Follow Up Recommendations  SNF    Equipment Recommendations       Recommendations for Other Services      Precautions / Restrictions Precautions Precautions: Fall Restrictions Weight Bearing Restrictions: No Other Position/Activity Restrictions: pt with prefab R wrist splint, reports from injury sustained during fall at home. Pt denies any current restrictions to RUE (ie weight-bearing).       Mobility Bed Mobility Overal bed mobility: Needs Assistance Bed Mobility: Supine to Sit     Supine to sit: Max assist     General bed mobility comments: Max A for all aspects. posterior lean. utilized pad under pt to scoot hips to full EOB position    Transfers Overall transfer level: Needs assistance   Transfers: Squat Pivot Transfers     Squat pivot transfers: Max assist;From elevated surface     General transfer comment: from elevated EOB height to recliner. Max A to squat-pivot to pt's left side.    Balance Overall balance assessment: Needs assistance Sitting-balance support: Bilateral upper extremity supported;Feet supported Sitting  balance-Leahy Scale: Poor   Postural control: Left lateral lean;Posterior lean                                 ADL either performed or assessed with clinical judgement   ADL Overall ADL's : Needs assistance/impaired                         Toilet Transfer: Maximal assistance;Squat-pivot;BSC Toilet Transfer Details (indicate cue type and reason): simulated with EOB to recliner           General ADL Comments: Posterior lean EOB. squat-pivot to recliner towards toileting goals.     Vision       Perception     Praxis      Cognition Arousal/Alertness: Awake/alert Behavior During Therapy: Flat affect Overall Cognitive Status: No family/caregiver present to determine baseline cognitive functioning                                          Exercises     Shoulder Instructions       General Comments      Pertinent Vitals/ Pain       Pain Assessment: Faces Faces Pain Scale: Hurts a little bit Pain Location: unspecified, grimacing during transfers/bed mobility Pain Descriptors / Indicators: Grimacing Pain Intervention(s): Monitored during session;Limited activity within patient's tolerance;Repositioned  Home Living  Prior Functioning/Environment              Frequency  Min 2X/week        Progress Toward Goals  OT Goals(current goals can now be found in the care plan section)  Progress towards OT goals: Progressing toward goals  Acute Rehab OT Goals Patient Stated Goal: to go home OT Goal Formulation: With patient Time For Goal Achievement: 09/14/21 Potential to Achieve Goals: Good ADL Goals Pt Will Perform Upper Body Dressing: with mod assist;sitting Pt Will Perform Lower Body Dressing: with mod assist;sit to/from stand Pt Will Transfer to Toilet: with mod assist;ambulating;bedside commode Pt Will Perform Toileting - Clothing Manipulation and hygiene:  with mod assist;sit to/from stand Additional ADL Goal #1: Pt will complete bed mobility at mod A level to prepare for EOB/OOB ADLs  Plan Discharge plan remains appropriate    Co-evaluation                 AM-PAC OT "6 Clicks" Daily Activity     Outcome Measure   Help from another person eating meals?: A Little Help from another person taking care of personal grooming?: A Lot Help from another person toileting, which includes using toliet, bedpan, or urinal?: Total Help from another person bathing (including washing, rinsing, drying)?: Total Help from another person to put on and taking off regular upper body clothing?: A Lot Help from another person to put on and taking off regular lower body clothing?: Total 6 Click Score: 10    End of Session    OT Visit Diagnosis: Unsteadiness on feet (R26.81);Muscle weakness (generalized) (M62.81);Pain;Other symptoms and signs involving cognitive function;History of falling (Z91.81);Repeated falls (R29.6)   Activity Tolerance Patient tolerated treatment well;Patient limited by fatigue   Patient Left in chair;with call bell/phone within reach;with chair alarm set   Nurse Communication Other (comment) (NT: pt up in chair)        Time: 1255-1315 OT Time Calculation (min): 20 min  Charges: OT General Charges $OT Visit: 1 Visit OT Treatments $Self Care/Home Management : 8-22 mins  Raynald Kemp, OT Acute Rehabilitation Services Pager: [phone] Office: [phone]   Pilar Grammes 09/07/2021, 1:28 PM

## 2021-09-08 DIAGNOSIS — I5030 Unspecified diastolic (congestive) heart failure: Secondary | ICD-10-CM | POA: Diagnosis not present

## 2021-09-08 DIAGNOSIS — E876 Hypokalemia: Secondary | ICD-10-CM | POA: Diagnosis not present

## 2021-09-08 DIAGNOSIS — U071 COVID-19: Secondary | ICD-10-CM | POA: Diagnosis not present

## 2021-09-08 DIAGNOSIS — F29 Unspecified psychosis not due to a substance or known physiological condition: Secondary | ICD-10-CM | POA: Diagnosis not present

## 2021-09-08 DIAGNOSIS — I10 Essential (primary) hypertension: Secondary | ICD-10-CM | POA: Diagnosis not present

## 2021-09-08 DIAGNOSIS — R609 Edema, unspecified: Secondary | ICD-10-CM | POA: Diagnosis not present

## 2021-09-08 DIAGNOSIS — K5900 Constipation, unspecified: Secondary | ICD-10-CM | POA: Diagnosis not present

## 2021-09-08 DIAGNOSIS — E785 Hyperlipidemia, unspecified: Secondary | ICD-10-CM | POA: Diagnosis not present

## 2021-09-08 DIAGNOSIS — M48061 Spinal stenosis, lumbar region without neurogenic claudication: Secondary | ICD-10-CM | POA: Diagnosis not present

## 2021-09-08 DIAGNOSIS — D61818 Other pancytopenia: Secondary | ICD-10-CM | POA: Diagnosis not present

## 2021-09-08 DIAGNOSIS — I5032 Chronic diastolic (congestive) heart failure: Secondary | ICD-10-CM | POA: Diagnosis not present

## 2021-09-08 DIAGNOSIS — I4891 Unspecified atrial fibrillation: Secondary | ICD-10-CM | POA: Diagnosis not present

## 2021-09-08 DIAGNOSIS — Z7901 Long term (current) use of anticoagulants: Secondary | ICD-10-CM | POA: Diagnosis not present

## 2021-09-08 DIAGNOSIS — D509 Iron deficiency anemia, unspecified: Secondary | ICD-10-CM | POA: Diagnosis not present

## 2021-09-08 DIAGNOSIS — R531 Weakness: Secondary | ICD-10-CM | POA: Diagnosis not present

## 2021-09-08 DIAGNOSIS — I480 Paroxysmal atrial fibrillation: Secondary | ICD-10-CM | POA: Diagnosis not present

## 2021-09-08 DIAGNOSIS — N289 Disorder of kidney and ureter, unspecified: Secondary | ICD-10-CM | POA: Diagnosis not present

## 2021-09-08 DIAGNOSIS — Z7401 Bed confinement status: Secondary | ICD-10-CM | POA: Diagnosis not present

## 2021-09-08 DIAGNOSIS — K219 Gastro-esophageal reflux disease without esophagitis: Secondary | ICD-10-CM | POA: Diagnosis not present

## 2021-09-08 DIAGNOSIS — G2581 Restless legs syndrome: Secondary | ICD-10-CM | POA: Diagnosis not present

## 2021-09-08 DIAGNOSIS — G8929 Other chronic pain: Secondary | ICD-10-CM | POA: Diagnosis not present

## 2021-09-08 DIAGNOSIS — M545 Low back pain, unspecified: Secondary | ICD-10-CM | POA: Diagnosis not present

## 2021-09-08 DIAGNOSIS — S62606D Fracture of unspecified phalanx of right little finger, subsequent encounter for fracture with routine healing: Secondary | ICD-10-CM | POA: Diagnosis not present

## 2021-09-08 DIAGNOSIS — K6389 Other specified diseases of intestine: Secondary | ICD-10-CM | POA: Diagnosis not present

## 2021-09-08 DIAGNOSIS — R001 Bradycardia, unspecified: Secondary | ICD-10-CM | POA: Diagnosis not present

## 2021-09-08 DIAGNOSIS — Z515 Encounter for palliative care: Secondary | ICD-10-CM | POA: Diagnosis not present

## 2021-09-08 DIAGNOSIS — L89626 Pressure-induced deep tissue damage of left heel: Secondary | ICD-10-CM | POA: Diagnosis not present

## 2021-09-08 DIAGNOSIS — G4700 Insomnia, unspecified: Secondary | ICD-10-CM | POA: Diagnosis not present

## 2021-09-08 DIAGNOSIS — I499 Cardiac arrhythmia, unspecified: Secondary | ICD-10-CM | POA: Diagnosis not present

## 2021-09-08 DIAGNOSIS — R0602 Shortness of breath: Secondary | ICD-10-CM | POA: Diagnosis not present

## 2021-09-08 DIAGNOSIS — R1084 Generalized abdominal pain: Secondary | ICD-10-CM | POA: Diagnosis not present

## 2021-09-08 DIAGNOSIS — J449 Chronic obstructive pulmonary disease, unspecified: Secondary | ICD-10-CM | POA: Diagnosis not present

## 2021-09-08 DIAGNOSIS — G20 Parkinson's disease: Secondary | ICD-10-CM | POA: Diagnosis not present

## 2021-09-08 DIAGNOSIS — R5381 Other malaise: Secondary | ICD-10-CM | POA: Diagnosis not present

## 2021-09-08 MED ORDER — IRBESARTAN 300 MG PO TABS: 150.0000 mg | ORAL_TABLET | Freq: Every day | ORAL | Status: DC

## 2021-09-08 MED ORDER — LACTATED RINGERS IV SOLN: INTRAVENOUS | Status: AC

## 2021-09-08 NOTE — Progress Notes (Signed)
[facility]                                                                                                                                                                         Patient Demographics  Cheryl Atkinson, is a 87 y.o. female  SFK:[id number]  FVC:[id number]  DOB - November 07, 1931  Admit date - 08/29/2021  Admitting Physician [name], MD  Outpatient Primary MD for the patient is Shelva Majestic, MD  LOS - 9   Chief Complaint  Patient presents with   Weakness        Assessment & Plan    Patient seen briefly today due for discharge soon per Discharge done yesterday by Dr Jerral Ralph, no further issues,  patient feels fine, mild clinical dehydration, IVF x 2 hrs then DC to SNF.   Time Spent in minutes   10 minutes   Susa Raring M.D on 09/08/2021 at 11:02 AM  Between 7am to 7pm - Pager - [phone]  After 7pm go to [email or web address] - password [email or web address]  And look for the night coverage person covering for me after hours  [facility] Office  [phone]    Subjective:   Cheryl Atkinson today has, No headache, No chest pain, No abdominal pain - No Nausea, No new weakness tingling or numbness, No Cough - SOB.   Objective:   Vitals:   09/08/21 0000 09/08/21 0500 09/08/21 0734 09/08/21 0800  BP: (!) 115/49 132/71 133/78 98/85  Pulse: 63 62 80 82  Resp: 19 16 20 17   Temp: 98 F (36.7 C) 98.7 F (37.1 C) 98.2 F (36.8 C)   TempSrc: Oral Oral Oral   SpO2: 98% 97% 96% 96%  Weight:  57.5 kg    Height:        Wt Readings from Last 3 Encounters:  09/08/21 57.5 kg  08/16/21 62.6 kg  07/17/21 61.8 kg     Intake/Output Summary (Last 24 hours) at 09/08/2021 1102 Last data filed at 09/08/2021 1000 Gross per 24 hour  Intake 518.11 ml  Output 1025 ml  Net -506.89 ml    Exam Awake Alert, Oriented X 3, No new F.N deficits, Normal affect Fisher.AT,PERRAL Supple Neck,No  JVD, No cervical lymphadenopathy appriciated.  Symmetrical Chest wall movement, Good air movement bilaterally, CTAB RRR,No Gallops,Rubs or new Murmurs, No Parasternal Heave +ve B.Sounds, Abd Soft, Non tender, No organomegaly appriciated, No rebound - guarding or rigidity. No Cyanosis, Clubbing or edema, No new Rash or bruise     Data Review

## 2021-09-08 NOTE — Progress Notes (Signed)
Reassessment, no changes at this time.

## 2021-09-08 NOTE — Progress Notes (Signed)
Patient discharged to SNF.  Report called to facility.  No questions or concerns noted at this time.  PIV removed.  Patient tolerated well.  Patient leaves the unit with all belongings including cell phone, charging cord and dentures.

## 2021-09-08 NOTE — TOC Transition Note (Signed)
Transition of Care [facility]) - CM/SW Discharge Note   Patient Details  Name: Cheryl Atkinson MRN: [id number] Date of Birth: January 15, 1931  Transition of Care [facility]) CM/SW Contact:  Verna Czech [place], [place] Phone Number: [phone] 09/08/2021, 10:52 AM   Clinical Narrative:     CSW confirmed that patient will be discharging to [facility] today. Patient to be transported by [facility].  DNR and medical necessity form is on the chart, pt will go to Rm# 210.   Khloe Hunkele, LCSW Transition of Care [phone]   Final next level of care: Skilled Nursing Facility Barriers to Discharge: Continued Medical Work up, SNF Covid Recovering   Patient Goals and CMS Choice   CMS Medicare.gov Compare Post Acute Care list provided to:: Patient Represenative (must comment) Choice offered to / list presented to : Sibling  Discharge Placement                       Discharge Plan and Services   Discharge Planning Services: CM Consult                                 Social Determinants of Health (SDOH) Interventions     Readmission Risk Interventions No flowsheet data found.

## 2021-09-10 DIAGNOSIS — R5381 Other malaise: Secondary | ICD-10-CM | POA: Diagnosis not present

## 2021-09-10 DIAGNOSIS — U071 COVID-19: Secondary | ICD-10-CM | POA: Diagnosis not present

## 2021-09-10 DIAGNOSIS — I5030 Unspecified diastolic (congestive) heart failure: Secondary | ICD-10-CM | POA: Diagnosis not present

## 2021-09-10 DIAGNOSIS — J449 Chronic obstructive pulmonary disease, unspecified: Secondary | ICD-10-CM | POA: Diagnosis not present

## 2021-09-12 ENCOUNTER — Other Ambulatory Visit: Payer: Self-pay | Admitting: *Deleted

## 2021-09-12 ENCOUNTER — Encounter (HOSPITAL_COMMUNITY): Payer: Self-pay | Admitting: Radiology

## 2021-09-12 NOTE — Patient Outreach (Signed)
Member screened for potential [facility] Care Management needs.   Mrs. Heiner resides in [facility] SNF. She is active with [facility] embedded care coordination team with PCP office.   Communication sent to facility SNF SW to inquire about transition plans.   Will continue to follow while member resides in SNF. Will send message to [facility] RN with embedded care coordination team.    Raiford Noble, MSN, RN,BSN [facility] Post Acute Care Coordinator [phone] [facility]) [phone]  (Toll free office)

## 2021-09-20 ENCOUNTER — Other Ambulatory Visit: Payer: Self-pay | Admitting: *Deleted

## 2021-09-20 ENCOUNTER — Encounter (INDEPENDENT_AMBULATORY_CARE_PROVIDER_SITE_OTHER): Payer: Medicare Other | Admitting: Ophthalmology

## 2021-09-20 NOTE — Patient Outreach (Signed)
[facility] Post Acute Care Coordinator follow up. Mrs. Diperna resides in [facility] SNF.  Update received from [facility] SNF SW indicating transition plans are pending.   Will plan outreach to Mrs. Rossa' sister/DPR/POA to discuss transition plans.   Raiford Noble, MSN, RN,BSN [facility] Post Acute Care Coordinator [phone] [facility]) [phone]  (Toll free office)

## 2021-09-25 ENCOUNTER — Other Ambulatory Visit: Payer: Self-pay | Admitting: *Deleted

## 2021-09-25 NOTE — Patient Outreach (Signed)
[facility] Post- Acute Care Coordinator follow up. Mrs. Duling is active with [facility] embedded care coordination team RNCM with PCP office.   Mrs. Vantol resides in [facility] SN[facility]. Telephone call made to member's sister/DPR Steward Drone [phone].  Patient identifiers confirmed. Steward Drone states the transition plan is to return home. Not sure of transition date yet.   Will continue to follow and update [facility] RN embedded care coordinator with [facility].    Raiford Noble, MSN, RN,BSN [facility] Post Acute Care Coordinator [phone] [facility]) [phone]  (Toll free office)

## 2021-10-02 ENCOUNTER — Non-Acute Institutional Stay: Payer: Medicare Other | Admitting: Hospice

## 2021-10-02 ENCOUNTER — Other Ambulatory Visit: Payer: Self-pay

## 2021-10-02 DIAGNOSIS — I5032 Chronic diastolic (congestive) heart failure: Secondary | ICD-10-CM

## 2021-10-02 DIAGNOSIS — R531 Weakness: Secondary | ICD-10-CM | POA: Diagnosis not present

## 2021-10-02 DIAGNOSIS — G8929 Other chronic pain: Secondary | ICD-10-CM | POA: Diagnosis not present

## 2021-10-02 DIAGNOSIS — M545 Low back pain, unspecified: Secondary | ICD-10-CM

## 2021-10-02 DIAGNOSIS — Z515 Encounter for palliative care: Secondary | ICD-10-CM | POA: Diagnosis not present

## 2021-10-02 NOTE — Progress Notes (Signed)
[facility] Palliative Care Consult Note Telephone: [phone]  Fax: [phone]  PATIENT NAME: Cheryl Atkinson Cheryl Atkinson [id number] [phone] (home)  DOB: July 17, 1931 MRN: [id number]  PRIMARY CARE PROVIDER:    Marin Olp, MD,  [place] [address] [phone]  REFERRING PROVIDER:   Dr. Josetta Atkinson  RESPONSIBLE PARTY:   Self/[place] - [place]     Name Relation Home Work [place] ([place]) Sister   [phone]   Alma, Mohiuddin [phone]  [phone]   Verta Ellen [phone]  [phone]   Roy Other [phone]         I met face to face with patient at facility. Palliative Care was asked to follow this patient by consultation request of  Dr. Josetta Atkinson to address advance care planning, complex medical decision making and goals of care clarification.  NP called Cheryl Atkinson and updated her on patient's status.  She expressed appreciation for the call.  This is the initial visit.    ASSESSMENT AND / RECOMMENDATIONS:   Advance Care Planning: Our advance care planning conversation included a discussion about:    The value and importance of advance care planning  Difference between Hospice and Palliative care Exploration of goals of care in the event of a sudden injury or illness  Identification and preparation of a healthcare agent  Review and updating or creation of an  advance directive document . Decision not to resuscitate or to de-escalate disease focused treatments due to poor prognosis.  CODE STATUS: Patient is a DO NOT RESUSCITATE  Goals of Care: Goals include to maximize quality of life and symptom management  I spent 20 minutes providing this initial consultation. More than 50% of the time in this consultation was spent on counseling patient and coordinating  communication. --------------------------------------------------------------------------------------------------------------------------------------  Symptom Management/Plan: Weakness: PT/OT for strengthening.  Provide assistance during meals to ensure adequate oral intake. CHF: Continue Lasix as ordered.  Reduced salt. Routine CBC BMP Low back pain: Managed with Tramadol prn Follow up: Palliative care will continue to follow for complex medical decision making, advance care planning, and clarification of goals. Return 6 weeks or prn.Encouraged to call provider sooner with any concerns.   Family /Caregiver/Community Supports: Patient in SNF for ongoing care.  HOSPICE ELIGIBILITY/DIAGNOSIS: TBD  Chief Complaint: Initial Palliative care visit  HISTORY OF PRESENT ILLNESS:  Cheryl Atkinson is a 87 y.o. year old female  with multiple medical conditions including acute on chronic weakness worsened with recent hospitalization 9/28 - 09/08/2021 for COVID-19 virus infection.  Weakness impairs her ADLs, worse as the day progresses.  Ongoing OT/PT is helpful.  History of COPD, diastolic congestive heart failure, Parkinson disease, insomnia, hypertension.  Patient denies pain/discomfort, in no respiratory distress. History obtained from review of EMR, discussion with primary team, caregiver, family and/or Cheryl Atkinson.  Review and summarization of Epic records shows history from other than patient. Rest of 10 point ROS asked and negative.     Review of lab tests/diagnostics   Results for Cheryl, Atkinson (MRN [id number]) as of 10/02/2021 17:27  Ref. Range 09/04/2021 01:07  Sodium Latest Ref Range: 135 - 145 mmol/L 135  Potassium Latest Ref Range: 3.5 - 5.1 mmol/L 3.9  Chloride Latest Ref Range: 98 - 111 mmol/L 100  CO2 Latest Ref Range: 22 - 32 mmol/L 24  Glucose Latest Ref Range: 70 - 99 mg/dL 122 (H)  BUN Latest Ref Range: 8 -  23 mg/dL 26 (H)  Creatinine Latest Ref Range: 0.44 - 1.00 mg/dL 0.98   Calcium Latest Ref Range: 8.9 - 10.3 mg/dL 8.7 (L)  Anion gap Latest Ref Range: 5 - 15  11  GFR, Estimated Latest Ref Range: >60 mL/min 56 (L)  WBC Latest Ref Range: 4.0 - 10.5 K/uL 3.7 (L)  RBC Latest Ref Range: 3.87 - 5.11 MIL/uL 3.67 (L)  Hemoglobin Latest Ref Range: 12.0 - 15.0 g/dL 10.4 (L)  HCT Latest Ref Range: 36.0 - 46.0 % 33.0 (L)  MCV Latest Ref Range: 80.0 - 100.0 fL 89.9  MCH Latest Ref Range: 26.0 - 34.0 pg 28.3  MCHC Latest Ref Range: 30.0 - 36.0 g/dL 31.5  RDW Latest Ref Range: 11.5 - 15.5 % 15.3  Platelets Latest Ref Range: 150 - 400 K/uL 137 (L)  nRBC Latest Ref Range: 0.0 - 0.2 % 0.0  TSH Latest Ref Range: 0.350 - 4.500 uIU/mL 1.157    ROS General: NAD EYES: denies vision changes ENMT: denies dysphagia Cardiovascular: denies chest pain/discomfort Pulmonary: denies cough, denies SOB Abdomen: endorses fair to good appetite, denies constipation/diarrhea GU: denies dysuria, urinary frequency MSK:  endorses weakness,  no falls reported Skin: denies rashes or wounds Neurological: denies pain, denies insomnia Psych: Endorses positive mood Heme/lymph/immuno: denies bruises, abnormal bleeding  Physical Exam: Constitutional: NAD General: Well groomed, cooperative EYES: anicteric sclera, lids intact, no discharge  ENMT: Moist mucous membrane CV: S1 S2, RRR, no LE edema Pulmonary: LCTA, no increased work of breathing, no cough, Abdomen: active BS + 4 quadrants, soft and non tender GU: no suprapubic tenderness MSK: weakness, sarcopenia, limited ROM, gait imbalance Skin: warm and dry, no rashes or wounds on visible skin Neuro:  weakness, otherwise non focal Psych: non-anxious affect Hem/lymph/immuno: no widespread bruising   PAST MEDICAL HISTORY:  Active Ambulatory Problems    Diagnosis Date Noted   Iron deficiency anemia 11/09/2007   RESTLESS LEG SYNDROME 09/25/2007   Essential hypertension 09/25/2007   COPD (chronic obstructive pulmonary disease) ([place])  09/20/2009   GERD 09/25/2007   CONSTIPATION, CHRONIC 09/20/2009   HIP PAIN, BILATERAL 07/16/2010   LOW BACK PAIN 09/25/2007   Osteoporosis 09/25/2007   History of UTI 11/09/2007   Spinal stenosis of lumbar region at multiple levels 09/29/2012   Multinodular goiter 08/30/2013   Hyperglycemia 07/19/2014   Former smoker 09/29/2014   DNR (do not resuscitate) 09/29/2014   Tinnitus 12/31/2014   Atherosclerotic PVD with intermittent claudication ([place]) 04/26/2015   Claudication ([place]) 05/04/2015   Hyperlipidemia 06/30/2015   Renal artery stenosis (HCC) 09/27/2015   Mallet toe of right foot 10/20/2015   Memory loss 04/11/2016   BPPV (benign paroxysmal positional vertigo) 07/12/2016   Hypokalemia 12/21/2016   Cat bite of right hand 12/21/2016   Major depression 03/25/2018   Abdominal aortic ectasia ([place]) 12/29/2019   Atrial fibrillation ([place]) 01/25/2020   Bacteremia 01/28/2020   Heart failure with preserved ejection fraction ([place]) 03/01/2020   Insomnia 03/01/2020   S/P laparoscopic cholecystectomy 04/04/2020   Pleural effusion due to CHF (congestive heart failure) ([place]) 05/26/2020   B12 deficiency 02/27/2021   COVID-19 virus infection 08/29/2021   Renal lesion 08/29/2021   COVID-19 08/30/2021   Resolved Ambulatory Problems    Diagnosis Date Noted   OBST CHRONIC BRONCHITIS W/ACUTE BRONCHITIS 05/24/2009   Thyroid cyst 09/10/2013   Cellulitis of right lower extremity 11/27/2013   Cellulitis of right hand 12/21/2016   Elevated lactic acid level 12/21/2016   Acute cholecystitis 12/29/2019  UTI (urinary tract infection) 12/29/2019   Adrenal nodule ([place]) 12/29/2019   Sepsis ([place]) 12/29/2019   Sepsis ([place]) 01/25/2020   Acute cholangitis 01/25/2020   Recurrent major depressive disorder, in full remission ([place]) 03/01/2020   Acute respiratory failure ([place]) 04/06/2020   Encephalopathy 04/07/2020   AKI (acute kidney injury) ([place]) 04/07/2020   Acute respiratory failure with hypoxia ([place])  04/07/2020   Postoperative hematoma involving digestive system following digestive system procedure 04/13/2020   Heart failure ([place]) 05/23/2020   Acute on chronic diastolic CHF (congestive heart failure) ([place]) 05/24/2020   Protein-calorie malnutrition, severe 05/31/2020   Past Medical History:  Diagnosis Date   Anemia    Arthritis    CHF (congestive heart failure) (HCC)    Chronic lower back pain    Constipation    GERD (gastroesophageal reflux disease)    History of hiatal hernia    HLD (hyperlipidemia)    Hypertension    Osteoporosis    Peripheral arterial disease (HCC)    Restless leg syndrome    Thyroid disease     SOCIAL HX:  Social History   Tobacco Use   Smoking status: Former    Packs/day: 0.50    Years: 60.00    Pack years: 30.00    Types: Cigarettes    Quit date: 04/02/2015    Years since quitting: 6.5   Smokeless tobacco: Never  Substance Use Topics   Alcohol use: No     FAMILY HX:  Family History  Problem Relation Age of Onset   Heart disease Mother    Heart disease Father    Heart attack Father    Cancer Sister        Breast Cancer   Atrial fibrillation Sister    Stroke Maternal Grandmother    Cancer Paternal Grandmother    Coronary artery disease Other       ALLERGIES:  Allergies  Allergen Reactions   Codeine     hallucinations      PERTINENT MEDICATIONS:  Outpatient Encounter Medications as of 10/02/2021  Medication Sig   Albuterol Sulfate (PROAIR RESPICLICK) 376 (90 Base) MCG/ACT AEPB Inhale 2 puffs into the lungs every 6 (six) hours as needed (shortness of breath from COPD).   apixaban (ELIQUIS) 2.5 MG TABS tablet Take 1 tablet (2.5 mg total) by mouth 2 (two) times daily.   CALCIUM PO Take by mouth.   carbidopa-levodopa (SINEMET IR) 25-100 MG tablet Take daily as needed for worsening RLS.   Cholecalciferol (VITAMIN D3 PO) Take by mouth.   ferrous sulfate 325 (65 FE) MG tablet Take 325 mg by mouth 2 (two) times a week. Patient takes one  tablet on Monday and Friday   furosemide (LASIX) 40 MG tablet Take 0.5 tablets (20 mg total) by mouth daily.   irbesartan (AVAPRO) 300 MG tablet Take 1 tablet (300 mg total) by mouth daily.   LINZESS 145 MCG CAPS capsule Take 145 mcg by mouth daily as needed (constipation).   Multiple Vitamins-Minerals (PRESERVISION AREDS 2 PO) Take 1 tablet by mouth 2 (two) times daily.   polyethylene glycol (MIRALAX / GLYCOLAX) 17 g packet Take 17 g by mouth 2 (two) times daily. Reported on 03/14/2016 (Patient taking differently: Take 17 g by mouth daily as needed for mild constipation.)   potassium chloride SA (KLOR-CON) 10 MEQ tablet Take 1 tablet (10 mEq total) by mouth 2 (two) times daily as needed. Needs to be taken  With each dose of lasix   rosuvastatin (CRESTOR) 40  MG tablet Take 1 tablet (40 mg total) by mouth daily. Take 1 tablet by mouth daily.   Rotigotine (NEUPRO) 3 MG/24HR PT24 PLACE 1 PATCH (3 MG) ONTO THE SKIN AT BEDTIME (Patient taking differently: No sig reported)   traZODone (DESYREL) 50 MG tablet Take 1.5 tablets (75 mg total) by mouth at bedtime as needed for sleep.   vitamin B-12 (CYANOCOBALAMIN) 1000 MCG tablet Take 1,000 mcg by mouth daily.   No facility-administered encounter medications on file as of 10/02/2021.     Thank you for the opportunity to participate in the care of Ms. Gazzola.  The palliative care team will continue to follow. Please call our office at [phone] if we can be of additional assistance.   Note: Portions of this note were generated with [employer]. Dictation errors may occur despite best attempts at proofreading.  Teodoro Spray, NP

## 2021-10-07 DIAGNOSIS — Z87891 Personal history of nicotine dependence: Secondary | ICD-10-CM | POA: Diagnosis not present

## 2021-10-07 DIAGNOSIS — I5032 Chronic diastolic (congestive) heart failure: Secondary | ICD-10-CM | POA: Diagnosis not present

## 2021-10-07 DIAGNOSIS — I70213 Atherosclerosis of native arteries of extremities with intermittent claudication, bilateral legs: Secondary | ICD-10-CM | POA: Diagnosis not present

## 2021-10-07 DIAGNOSIS — M810 Age-related osteoporosis without current pathological fracture: Secondary | ICD-10-CM | POA: Diagnosis not present

## 2021-10-07 DIAGNOSIS — R001 Bradycardia, unspecified: Secondary | ICD-10-CM | POA: Diagnosis not present

## 2021-10-07 DIAGNOSIS — E538 Deficiency of other specified B group vitamins: Secondary | ICD-10-CM | POA: Diagnosis not present

## 2021-10-07 DIAGNOSIS — I110 Hypertensive heart disease with heart failure: Secondary | ICD-10-CM | POA: Diagnosis not present

## 2021-10-07 DIAGNOSIS — U071 COVID-19: Secondary | ICD-10-CM | POA: Diagnosis not present

## 2021-10-07 DIAGNOSIS — I700 Atherosclerosis of aorta: Secondary | ICD-10-CM | POA: Diagnosis not present

## 2021-10-07 DIAGNOSIS — F3341 Major depressive disorder, recurrent, in partial remission: Secondary | ICD-10-CM | POA: Diagnosis not present

## 2021-10-07 DIAGNOSIS — J449 Chronic obstructive pulmonary disease, unspecified: Secondary | ICD-10-CM | POA: Diagnosis not present

## 2021-10-07 DIAGNOSIS — E785 Hyperlipidemia, unspecified: Secondary | ICD-10-CM | POA: Diagnosis not present

## 2021-10-07 DIAGNOSIS — Z7901 Long term (current) use of anticoagulants: Secondary | ICD-10-CM | POA: Diagnosis not present

## 2021-10-07 DIAGNOSIS — Z9181 History of falling: Secondary | ICD-10-CM | POA: Diagnosis not present

## 2021-10-07 DIAGNOSIS — I480 Paroxysmal atrial fibrillation: Secondary | ICD-10-CM | POA: Diagnosis not present

## 2021-10-07 DIAGNOSIS — G2581 Restless legs syndrome: Secondary | ICD-10-CM | POA: Diagnosis not present

## 2021-10-07 DIAGNOSIS — D509 Iron deficiency anemia, unspecified: Secondary | ICD-10-CM | POA: Diagnosis not present

## 2021-10-07 DIAGNOSIS — K219 Gastro-esophageal reflux disease without esophagitis: Secondary | ICD-10-CM | POA: Diagnosis not present

## 2021-10-07 DIAGNOSIS — M48061 Spinal stenosis, lumbar region without neurogenic claudication: Secondary | ICD-10-CM | POA: Diagnosis not present

## 2021-10-08 ENCOUNTER — Telehealth: Payer: Self-pay

## 2021-10-08 NOTE — Telephone Encounter (Signed)
Received a call from [facility] is calling in requesting verbal okay for home health orders.

## 2021-10-08 NOTE — Telephone Encounter (Signed)
Called and lm for [facility] to cb, unsure if this was the correct cb number since the name of the caller was not listed in the phone note.

## 2021-10-09 ENCOUNTER — Ambulatory Visit (INDEPENDENT_AMBULATORY_CARE_PROVIDER_SITE_OTHER): Payer: Medicare Other | Admitting: Family Medicine

## 2021-10-09 ENCOUNTER — Encounter: Payer: Self-pay | Admitting: Family Medicine

## 2021-10-09 ENCOUNTER — Other Ambulatory Visit: Payer: Self-pay

## 2021-10-09 VITALS — BP 136/84 | HR 96 | Temp 97.2°F | Ht 67.0 in | Wt 130.6 lb

## 2021-10-09 DIAGNOSIS — N281 Cyst of kidney, acquired: Secondary | ICD-10-CM | POA: Diagnosis not present

## 2021-10-09 DIAGNOSIS — E785 Hyperlipidemia, unspecified: Secondary | ICD-10-CM

## 2021-10-09 DIAGNOSIS — R739 Hyperglycemia, unspecified: Secondary | ICD-10-CM

## 2021-10-09 DIAGNOSIS — I10 Essential (primary) hypertension: Secondary | ICD-10-CM

## 2021-10-09 DIAGNOSIS — I4891 Unspecified atrial fibrillation: Secondary | ICD-10-CM

## 2021-10-09 DIAGNOSIS — I701 Atherosclerosis of renal artery: Secondary | ICD-10-CM

## 2021-10-09 DIAGNOSIS — Z8616 Personal history of COVID-19: Secondary | ICD-10-CM

## 2021-10-09 LAB — CBC WITH DIFFERENTIAL/PLATELET
Basophils Absolute: 0.1 10*3/uL (ref 0.0–0.1)
Basophils Relative: 0.7 % (ref 0.0–3.0)
Eosinophils Absolute: 0 10*3/uL (ref 0.0–0.7)
Eosinophils Relative: 0.5 % (ref 0.0–5.0)
HCT: 34.7 % — ABNORMAL LOW (ref 36.0–46.0)
Hemoglobin: 11.2 g/dL — ABNORMAL LOW (ref 12.0–15.0)
Lymphocytes Relative: 23.2 % (ref 12.0–46.0)
Lymphs Abs: 1.8 10*3/uL (ref 0.7–4.0)
MCHC: 32.2 g/dL (ref 30.0–36.0)
MCV: 87.7 fl (ref 78.0–100.0)
Monocytes Absolute: 0.8 10*3/uL (ref 0.1–1.0)
Monocytes Relative: 9.8 % (ref 3.0–12.0)
Neutro Abs: 5.1 10*3/uL (ref 1.4–7.7)
Neutrophils Relative %: 65.8 % (ref 43.0–77.0)
Platelets: 264 10*3/uL (ref 150.0–400.0)
RBC: 3.95 Mil/uL (ref 3.87–5.11)
RDW: 15.7 % — ABNORMAL HIGH (ref 11.5–15.5)
WBC: 7.8 10*3/uL (ref 4.0–10.5)

## 2021-10-09 LAB — COMPREHENSIVE METABOLIC PANEL
ALT: 12 U/L (ref 0–35)
AST: 17 U/L (ref 0–37)
Albumin: 3.9 g/dL (ref 3.5–5.2)
Alkaline Phosphatase: 107 U/L (ref 39–117)
BUN: 18 mg/dL (ref 6–23)
CO2: 28 mEq/L (ref 19–32)
Calcium: 9.4 mg/dL (ref 8.4–10.5)
Chloride: 100 mEq/L (ref 96–112)
Creatinine, Ser: 0.77 mg/dL (ref 0.40–1.20)
GFR: 69.02 mL/min (ref >60.00)
Glucose, Bld: 103 mg/dL — ABNORMAL HIGH (ref 70–99)
Potassium: 3.9 mEq/L (ref 3.5–5.1)
Sodium: 138 mEq/L (ref 135–145)
Total Bilirubin: 0.7 mg/dL (ref 0.2–1.2)
Total Protein: 7.6 g/dL (ref 6.0–8.3)

## 2021-10-09 LAB — LDL CHOLESTEROL, DIRECT: Direct LDL: 60 mg/dL

## 2021-10-09 NOTE — Progress Notes (Signed)
Phone [phone] In person visit   Subjective:   Cheryl Atkinson is a 87 y.o. year old very pleasant female patient who presents for/with See problem oriented charting Chief Complaint  Patient presents with   Follow-up    Hosp f/u hypokalemia and weakness.   This visit occurred during the SARS-CoV-2 public health emergency.  Safety protocols were in place, including screening questions prior to the visit, additional usage of staff PPE, and extensive cleaning of exam room while observing appropriate contact time as indicated for disinfecting solutions.   Past Medical History-  Patient Active Problem List   Diagnosis Date Noted   Pleural effusion due to CHF (congestive heart failure) (HCC) 05/26/2020    Priority: High   Heart failure with preserved ejection fraction (HCC) 03/01/2020    Priority: High   Bacteremia 01/28/2020    Priority: High   Atrial fibrillation (HCC) 01/25/2020    Priority: High   Memory loss 04/11/2016    Priority: High   Renal artery stenosis (HCC) 09/27/2015    Priority: High   Claudication (HCC) 05/04/2015    Priority: High   Atherosclerotic PVD with intermittent claudication (HCC) 04/26/2015    Priority: High   DNR (do not resuscitate) 09/29/2014    Priority: High   Hyperglycemia 07/19/2014    Priority: High   LOW BACK PAIN 09/25/2007    Priority: High   Insomnia 03/01/2020    Priority: Medium    Major depression 03/25/2018    Priority: Medium    BPPV (benign paroxysmal positional vertigo) 07/12/2016    Priority: Medium    Hyperlipidemia 06/30/2015    Priority: Medium    Former smoker 09/29/2014    Priority: Medium    COPD (chronic obstructive pulmonary disease) (HCC) 09/20/2009    Priority: Medium    Iron deficiency anemia 11/09/2007    Priority: Medium    RESTLESS LEG SYNDROME 09/25/2007    Priority: Medium    Essential hypertension 09/25/2007    Priority: Medium    GERD 09/25/2007    Priority: Medium    Osteoporosis 09/25/2007     Priority: Medium    S/P laparoscopic cholecystectomy 04/04/2020    Priority: Low   Abdominal aortic ectasia (HCC) 12/29/2019    Priority: Low   Cat bite of right hand 12/21/2016    Priority: Low   Mallet toe of right foot 10/20/2015    Priority: Low   Tinnitus 12/31/2014    Priority: Low   Multinodular goiter 08/30/2013    Priority: Low   Spinal stenosis of lumbar region at multiple levels 09/29/2012    Priority: Low   HIP PAIN, BILATERAL 07/16/2010    Priority: Low   CONSTIPATION, CHRONIC 09/20/2009    Priority: Low   History of UTI 11/09/2007    Priority: Low   COVID-19 08/30/2021   COVID-19 virus infection 08/29/2021   Renal lesion 08/29/2021   B12 deficiency 02/27/2021   Hypokalemia 12/21/2016    Medications- reviewed and updated Current Outpatient Medications  Medication Sig Dispense Refill   Albuterol Sulfate (PROAIR RESPICLICK) 108 (90 Base) MCG/ACT AEPB Inhale 2 puffs into the lungs every 6 (six) hours as needed (shortness of breath from COPD). 1 each 5   apixaban (ELIQUIS) 2.5 MG TABS tablet Take 1 tablet (2.5 mg total) by mouth 2 (two) times daily.     CALCIUM PO Take by mouth.     carbidopa-levodopa (SINEMET IR) 25-100 MG tablet Take daily as needed for worsening RLS. 90 tablet 1  Cholecalciferol (VITAMIN D3 PO) Take by mouth.     ferrous sulfate 325 (65 FE) MG tablet Take 325 mg by mouth 2 (two) times a week. Patient takes one tablet on Monday and Friday     furosemide (LASIX) 40 MG tablet Take 0.5 tablets (20 mg total) by mouth daily. 180 tablet 3   irbesartan (AVAPRO) 300 MG tablet Take 1 tablet (300 mg total) by mouth daily. 90 tablet 3   LINZESS 145 MCG CAPS capsule Take 145 mcg by mouth daily as needed (constipation).     Multiple Vitamins-Minerals (PRESERVISION AREDS 2 PO) Take 1 tablet by mouth 2 (two) times daily.     polyethylene glycol (MIRALAX / GLYCOLAX) 17 g packet Take 17 g by mouth 2 (two) times daily. Reported on 03/14/2016 (Patient taking  differently: Take 17 g by mouth daily as needed for mild constipation.)     potassium chloride SA (KLOR-CON) 10 MEQ tablet Take 1 tablet (10 mEq total) by mouth 2 (two) times daily as needed. Needs to be taken  With each dose of lasix     rosuvastatin (CRESTOR) 40 MG tablet Take 1 tablet (40 mg total) by mouth daily. Take 1 tablet by mouth daily. 90 tablet 3   Rotigotine (NEUPRO) 3 MG/24HR PT24 PLACE 1 PATCH (3 MG) ONTO THE SKIN AT BEDTIME (Patient taking differently: Place 3 mg onto the skin at bedtime.) 90 patch 3   traZODone (DESYREL) 50 MG tablet Take 1.5 tablets (75 mg total) by mouth at bedtime as needed for sleep. 135 tablet 3   vitamin B-12 (CYANOCOBALAMIN) 1000 MCG tablet Take 1,000 mcg by mouth daily.     No current facility-administered medications for this visit.     Objective:  BP 136/84   Pulse 96   Temp (!) 97.2 F (36.2 C)   Ht 5\' 7"  (1.702 m)   Wt 130 lb 9.6 oz (59.2 kg)   LMP  (LMP Unknown)   SpO2 97%   BMI 20.45 kg/m  Gen: NAD, resting comfortably CV: Irregular regular no murmurs rubs or gallops Lungs: CTAB no crackles, wheeze, rhonchi Ext: no edema Skin: warm, dry Neuro: Wheelchair-bound    Assessment and Plan    # Hospital to Admission F/U for COVID-19 infection and Hypokalemia S:Patient presented to the Hospital on 08/29/2021 with complaints of weakness and lethargy and just not feeling well overall - started feeling weak 2 days prior to the encounter. A COVID-19 test was ordered - positive result. Patient was then started on molnupiravir for 5 days - Paxlovid was contraindicated with her current medication.COVID-19 Infection remained stable on room air and there were no clinical signs of pneumonia. Generalized weakness was slowly improving and patient completed the 5-day molnupiravir antiviral course.Her generalized weakness /debility was likely to be due to the COVID infection - patient was then evaluated by PT/OT - recommendations of SNF were on  discharge.  CT Head was ordered and showed no acute intracranial pathology.  CT Angio Chest and Abdomen/Pelvis was ordered and found  No signs of pulmonary embolism limited at the subsegmental level in the lung bases. Small layering bilateral effusions. Moderate to marked cardiac enlargement. Three-vessel coronary artery disease. Worsening loss of height of the L2 vertebral level with substantial surrounding degenerative change. Approximately 80% loss of height and moderate canal narrowing due to retropulsion of posterior cortical elements. Correlate with any acute pain in this area with further imaging as warranted. Small LEFT renal lesion unchanged since studies from 2021, potentially hemorrhagic  or proteinaceous cyst, small indolent renal neoplasm is possible, f/u renal sonogram is suggested when the patient is able. Mild distension of the cecum with mid abdominal position without signs of twist of the bowel. No frank mesenteric twist. This could be due to a mobile cecum which can have an association with cecal volvulus, no current evidence. If there is worsening of abdominal pain could consider repeat imaging as warranted. Numerous bladder diverticuli. Ununited RIGHT clavicular fracture and subacute bilateral rib fractures.    From D/c summary   "Pertinent labs/radiology: 9/28>>Blood culture: No growth 9/28>>Urine Culture: Multiple species 9/28>> CT head: No acute intracranial pathology 9/28>> CT angio chest: No PE.  Ununited right clavicular fracture/subacute bilateral rib fractures. 9/28>> CT abdomen/pelvis: Left renal lesion unchanged since 2021-hemorrhagic/proteinaceous cyst, mild distention of the cecum  "  DG Chest was also ordered during the encounter and found cardiomegaly but no active disease present.  EKG was ordered during the encounter showed and atrial fibrillation rate at 93 and nonspecific ST changes with no evidence of acute ischemia.  Patient was then discharged on  09/08/2021.   Hypokalemia was repleted with 20 mEq in ER via IV and patient was to receive 20 more milliequivalents p.o. and check a magnesium. Instructed to continue daily potassium replacement. The active problem was though to be contributing to her overall weakness. Hypokalemia was repleted and to be rechecked periodically. On 10 meq tiwce daily.   Pancytopenia was also likely due to COVID-19 infection-mild-follow periodically.  Patient was then discharged on 09/08/2021:    Patient was to stop taking Amlodipine, Norco, and tramadol.  During hospitalization  Today patient reports overall doing better but not doing best today- son really upset her.  Got home Saturday from the facility- felt stronger with facility and PT. No lingering cough, fatigue- feels back to baseline  She states has decreased sensation primarily right hand- some in the left hand. Started before hospitalization. No neck pain. Not worsening. More of a numbness- not worse certain time of the day. Doesn't feel she can pick objects up as well. Doesn't think she can tolerate MRI  A/P: Patient appears to have fully recovered from COVID-19-has transitioned out of the nursing home and back home.  Generalized weakness is improved with physical therapy-should complete this and I am supportive of any home health physical therapy needs based off of this visit.  In regards to her hand numbness right hand worse than left-we discussed neuroimaging with MRI since she is already had a CT scan since this started-she is on Eliquis which would reduce risk of embolic stroke.  She does not think she could tolerate an MRI machine and would like to hold off on neuroimaging.  If persistent consider rechecking B12 (on B12 currently so I do not think this is going to be the cause) and TSH next visit  Left renal lesion: Per discharge summary "chronic issue-we will need outpatient renal ultrasound-we will defer to PCP"-and from CT scan "Small LEFT renal  lesion unchanged since studies from 2021, potentially hemorrhagic or proteinaceous cyst, small indolent renal neoplasm is possible, f/u renal sonogram is suggested when the patient is able." -Patient appears overall stable and offered ultrasound renal-she agrees to follow through with this   # Atrial fibrillation- new onset 2021 S: Rate controlled without medication  Anticoagulated with eliquis 2.5 mg BID-reduced dose with age/weight/renal function Patient is  followed by cardiology: Dr. Gwenlyn Found  A/P: Rate controlled without medication.  Appropriately anticoagulated-continue current medication  #CHF with  preserved EF #Hypertension S: medication: Compliant with irbesartan 300 mg (ok per cards as only unilateral renal artery stenosis), lasix 20 mg daily - down from twice daily -off hctz since starting lasix -off amlodipine march 2021 with edema issues.  BP Readings from Last 3 Encounters:  10/09/21 136/84  09/08/21 127/77  08/16/21 138/68  A/P: For CHF -appears euvolemic even with reducing the Lasix dose-continue current medication For HTN -well-controlled-continue current medication   #Hyperlipidemia/peripheral vascular disease/renal artery stenosis S: Compliant with atorvastatin 40 mg-->rosuvastatin 40 mg with LDL above 70 in early 2022 and also on eliquis (prior plavix/asa prior to a fib). She has known atherosclerotic peripheral vascular disease with intermittent claudication-stable with claudication at approximately 200 feet.  Lab Results  Component Value Date   CHOL 169 12/04/2020   HDL 80.50 12/04/2020   LDLCALC 78 12/04/2020   LDLDIRECT 53.0 07/17/2021   TRIG 54.0 12/04/2020   CHOLHDL 2 12/04/2020  A/P: Patient rather sedentary but no obvious increase in PVD symptoms.  Update direct LDL and target LDL under 70 for risk factor modification-continue current medications for now  #Insulin resistance/hyperglycemia S: medication: prior metformin 500 milligrams daily- diarrhea and  low b12 Lab Results  Component Value Date   HGBA1C 6.4 07/17/2021   HGBA1C 6.2 12/04/2020   HGBA1C 6.1 (H) 05/29/2020  A/P: A1c is increasing off of metformin but I think benefits of being off medication may outweigh benefits of being on medication given prior diarrhea issues-continue to monitor without medicine   Recommended follow up: Return in about 2 months (around 12/09/2021). Future Appointments  Date Time Provider [place]  10/17/2021  7:50 AM Narda Amber K, DO [facility] None  12/11/2021 10:40 AM Marin Olp, MD [facility]  08/08/2022  9:30 AM [facility] HEALTH COACH [facility]    Lab/Order associations:   ICD-10-CM   1. History of COVID-19  Z86.16     2. Atrial fibrillation, unspecified type ([place])  I48.91     3. Hyperlipidemia, unspecified hyperlipidemia type  E78.5 CBC with Differential/Platelet    Comprehensive metabolic panel    LDL cholesterol, direct    4. Pleural effusion due to CHF (congestive heart failure) (HCC)  I50.9     5. Essential hypertension  I10 CBC with Differential/Platelet    Comprehensive metabolic panel    LDL cholesterol, direct    6. Renal cyst  N28.1 US Renal    7. Hyperglycemia  R73.9      I,Harris Phan,acting as a scribe for Garret Reddish, MD.,have documented all relevant documentation on the behalf of Garret Reddish, MD,as directed by  Garret Reddish, MD while in the presence of Garret Reddish, MD.  I, Garret Reddish, MD, have reviewed all documentation for this visit. The documentation on 10/09/21 for the exam, diagnosis, procedures, and orders are all accurate and complete.   Return precautions advised.  Garret Reddish, MD

## 2021-10-09 NOTE — Patient Instructions (Addendum)
Please stop by lab before you go If you have mychart- we will send your results within 3 business days of [place] receiving them.  If you do not have mychart- we will call you about results within 5 business days of [place] receiving them.  *please also note that you will see labs on mychart as soon as they post. I will later go in and write notes on them- will say "notes from Dr. Durene Cal"  We will call you within two weeks about your referral to Ultrasound-Renal. If you do not hear within 2 weeks, give [place] a call.   Please let me know if you have any new or worsening symptoms in regards to the numbness/weakness in your hands.  Please let me know if your weight or swelling increases.  Recommended follow up: Return in about 2 months (around 12/09/2021).

## 2021-10-10 ENCOUNTER — Other Ambulatory Visit: Payer: Self-pay | Admitting: *Deleted

## 2021-10-10 ENCOUNTER — Telehealth: Payer: Self-pay

## 2021-10-10 DIAGNOSIS — I5032 Chronic diastolic (congestive) heart failure: Secondary | ICD-10-CM | POA: Diagnosis not present

## 2021-10-10 DIAGNOSIS — U071 COVID-19: Secondary | ICD-10-CM | POA: Diagnosis not present

## 2021-10-10 DIAGNOSIS — I110 Hypertensive heart disease with heart failure: Secondary | ICD-10-CM | POA: Diagnosis not present

## 2021-10-10 DIAGNOSIS — I70213 Atherosclerosis of native arteries of extremities with intermittent claudication, bilateral legs: Secondary | ICD-10-CM | POA: Diagnosis not present

## 2021-10-10 DIAGNOSIS — M48061 Spinal stenosis, lumbar region without neurogenic claudication: Secondary | ICD-10-CM | POA: Diagnosis not present

## 2021-10-10 DIAGNOSIS — J449 Chronic obstructive pulmonary disease, unspecified: Secondary | ICD-10-CM | POA: Diagnosis not present

## 2021-10-10 NOTE — Patient Outreach (Signed)
[facility] Post- Acute Care Coordinator follow up. Per [facility] Mrs. Homesley transitioned to home from [facility] on 10/06/21.   Telephone call made to Mrs. Valentino Nose [phone]. Patient identifiers confirmed. Mrs. Hoeschen reports she is doing well. States she had follow up PCP appointment on yesterday. Reports [facility]) is supposed to have visit today.  Discussed that Clinical research associate will communicate with embedded [facility] RNCM with PCP office. Writer will also communicate with [facility] palliative to alert them of Mrs. Wolter return to home.   Raiford Noble, MSN, RN,BSN [facility] Post Acute Care Coordinator [phone] [facility]) [phone]  (Toll free office)

## 2021-10-10 NOTE — Telephone Encounter (Signed)
..  Home Health Verbal Orders  Agency:  [facility]   Caller:Monique  Contact and title:  PT  Requesting OT/ PT/ Skilled nursing/ Social Work/ Speech:  PT  Reason for Request:  strengthening, bed mobility and transfer, gate balance training, home exercise program, home safety  Frequency:  1 week 1, 2 week 4, 1 week 2   HH needs F2F w/in last 30 days

## 2021-10-11 ENCOUNTER — Other Ambulatory Visit: Payer: Self-pay

## 2021-10-11 ENCOUNTER — Telehealth: Payer: Self-pay

## 2021-10-11 ENCOUNTER — Encounter (INDEPENDENT_AMBULATORY_CARE_PROVIDER_SITE_OTHER): Payer: Medicare Other | Admitting: Ophthalmology

## 2021-10-11 DIAGNOSIS — H353112 Nonexudative age-related macular degeneration, right eye, intermediate dry stage: Secondary | ICD-10-CM | POA: Diagnosis not present

## 2021-10-11 DIAGNOSIS — H43813 Vitreous degeneration, bilateral: Secondary | ICD-10-CM | POA: Diagnosis not present

## 2021-10-11 DIAGNOSIS — H35033 Hypertensive retinopathy, bilateral: Secondary | ICD-10-CM

## 2021-10-11 DIAGNOSIS — I10 Essential (primary) hypertension: Secondary | ICD-10-CM | POA: Diagnosis not present

## 2021-10-11 DIAGNOSIS — H353221 Exudative age-related macular degeneration, left eye, with active choroidal neovascularization: Secondary | ICD-10-CM | POA: Diagnosis not present

## 2021-10-11 NOTE — Telephone Encounter (Signed)
Called and lm on Cheryl Atkinson confidential vm with VO.

## 2021-10-11 NOTE — Chronic Care Management (AMB) (Signed)
  Care Management   Note  10/11/2021 Name: Cheryl Atkinson MRN: [id number] DOB: 1931/10/06  Cheryl Atkinson is a 87 y.o. year old female who is a primary care patient of Shelva Majestic, MD and is actively engaged with the care management team. I reached out to [name] by phone today to assist with scheduling a follow up visit with the RN Case Manager  Follow up plan: Telephone appointment with care management team member scheduled for:10/23/2021  Penne Lash, RMA Care Guide, Embedded Care Coordination [facility]  [place], [place] [address] Direct Dial: [phone] [email or web address]

## 2021-10-12 ENCOUNTER — Telehealth: Payer: Self-pay

## 2021-10-12 NOTE — Telephone Encounter (Signed)
Called and left detailed message on Melissa vm with VO to move eval to next week.

## 2021-10-12 NOTE — Telephone Encounter (Signed)
Melissa from Home Health called wanting verbal to move physical therapy eval for next week. Melissa would like a call back at [phone]. Please Advise.

## 2021-10-14 NOTE — ED Provider Notes (Signed)
[facility] Provider Note   CSN: [id number] Arrival date & time: 08/29/21  1346     History Chief Complaint  Patient presents with   Weakness    Cheryl Atkinson is a 87 y.o. female.  HPI     87yo female with history of CHF, COPD, hlpd, htn, PAD, RLS, thyroid disease, presents for generalized weakness from home.  Home health nurse and family tried to get her up from wheelchair to toilet and were unsuccessful due to generalized weakness. Began to feel weak about 2 adys ago.  Subjetive fever last night.  Had some headaches over the last few days. No coughin gor dyspnea. No abdominal pain, nausea, vomiting, diarrhea. No dysuria.  No falls, just feeling generally weak.   Past Medical History:  Diagnosis Date   Acute cholecystitis 12/29/2019   Anemia    Arthritis    "shoulders" (05/04/2015)   Cellulitis of right lower extremity 11/27/2013   CHF (congestive heart failure) (HCC)    Chronic lower back pain    Constipation    COPD (chronic obstructive pulmonary disease) (HCC)    GERD (gastroesophageal reflux disease)    History of hiatal hernia    HLD (hyperlipidemia)    Hypertension    Insomnia    Osteoporosis    Peripheral arterial disease ([place])    Restless leg syndrome    Thyroid disease     Patient Active Problem List   Diagnosis Date Noted   COVID-19 08/30/2021   COVID-19 virus infection 08/29/2021   Renal lesion 08/29/2021   B12 deficiency 02/27/2021   Pleural effusion due to CHF (congestive heart failure) ([place]) 05/26/2020   S/P laparoscopic cholecystectomy 04/04/2020   Heart failure with preserved ejection fraction ([place]) 03/01/2020   Insomnia 03/01/2020   Bacteremia 01/28/2020   Atrial fibrillation ([place]) 01/25/2020   Abdominal aortic ectasia ([place]) 12/29/2019   Major depression 03/25/2018   Hypokalemia 12/21/2016   Cat bite of right hand 12/21/2016   BPPV (benign paroxysmal positional vertigo) 07/12/2016   Memory loss 04/11/2016   Mallet  toe of right foot 10/20/2015   Renal artery stenosis ([place]) 09/27/2015   Hyperlipidemia 06/30/2015   Claudication ([place]) 05/04/2015   Atherosclerotic PVD with intermittent claudication ([place]) 04/26/2015   Tinnitus 12/31/2014   Former smoker 09/29/2014   DNR (do not resuscitate) 09/29/2014   Hyperglycemia 07/19/2014   Multinodular goiter 08/30/2013   Spinal stenosis of lumbar region at multiple levels 09/29/2012   HIP PAIN, BILATERAL 07/16/2010   COPD (chronic obstructive pulmonary disease) ([place]) 09/20/2009   CONSTIPATION, CHRONIC 09/20/2009   Iron deficiency anemia 11/09/2007   History of UTI 11/09/2007   RESTLESS LEG SYNDROME 09/25/2007   Essential hypertension 09/25/2007   GERD 09/25/2007   LOW BACK PAIN 09/25/2007   Osteoporosis 09/25/2007    Past Surgical History:  Procedure Laterality Date   ABDOMINAL AORTAGRAM  05/04/2015   Procedure: Abdominal Aortagram;  Surgeon: Lorretta Harp, MD;  Location: [facility];  Service: Cardiovascular;;   APPENDECTOMY     CATARACT EXTRACTION W/ INTRAOCULAR LENS  IMPLANT, BILATERAL Bilateral    CHOLECYSTECTOMY N/A 04/04/2020   Procedure: LAPAROSCOPIC CHOLECYSTECTOMY;  Surgeon: Georganna Skeans, MD;  Location: [facility];  Service: General;  Laterality: N/A;   I & D EXTREMITY Right 12/22/2016   Procedure: IRRIGATION AND DEBRIDEMENT EXTREMITY;  Surgeon: Milly Jakob, MD;  Location: [facility];  Service: Orthopedics;  Laterality: Right;   IR EXCHANGE BILIARY DRAIN  03/13/2020   IR PATIENT  EVAL TECH 0-60 MINS  12/30/2019   IR PERC CHOLECYSTOSTOMY  01/27/2020   IR RADIOLOGIST EVAL & MGMT  05/16/2020   PERIPHERAL VASCULAR CATHETERIZATION N/A 05/04/2015   Procedure: Lower Extremity Angiography;  Surgeon: Runell Gess, MD;  Location: [facility];  Service: Cardiovascular;  Laterality: N/A;   PERIPHERAL VASCULAR CATHETERIZATION  05/04/2015   Procedure: Peripheral Vascular Intervention;  Surgeon: Runell Gess, MD;  Location: [facility] INVASIVE CV [facility];   Service: Cardiovascular;;  RCIA - 7x22 ICAST   PERIPHERAL VASCULAR CATHETERIZATION Right 09/04/2015   Procedure: Peripheral Vascular Atherectomy;  Surgeon: Runell Gess, MD;  Location: [facility];  Service: Cardiovascular;  Laterality: Right;  SFA   sfa Right 09/04/2015   de balloon   THYROID SURGERY Right ?2013   "had goiter taken off my neck"   TONSILLECTOMY     VAGINAL HYSTERECTOMY       OB History   No obstetric history on file.     Family History  Problem Relation Age of Onset   Heart disease Mother    Heart disease Father    Heart attack Father    Cancer Sister        Breast Cancer   Atrial fibrillation Sister    Stroke Maternal Grandmother    Cancer Paternal Grandmother    Coronary artery disease Other     Social History   Tobacco Use   Smoking status: Former    Packs/day: 0.50    Years: 60.00    Pack years: 30.00    Types: Cigarettes    Quit date: 04/02/2015    Years since quitting: 6.5   Smokeless tobacco: Never  Vaping Use   Vaping Use: Never used  Substance Use Topics   Alcohol use: No   Drug use: No    Home Medications Prior to Admission medications   Medication Sig Start Date End Date Taking? Authorizing Provider  Albuterol Sulfate (PROAIR RESPICLICK) 108 (90 Base) MCG/ACT AEPB Inhale 2 puffs into the lungs every 6 (six) hours as needed (shortness of breath from COPD). 12/04/20   Shelva Majestic, MD  apixaban (ELIQUIS) 2.5 MG TABS tablet Take 1 tablet (2.5 mg total) by mouth 2 (two) times daily. 09/07/21   Ghimire, Werner Lean, MD  CALCIUM PO Take by mouth.    [provider]  carbidopa-levodopa (SINEMET IR) 25-100 MG tablet Take daily as needed for worsening RLS. 07/16/21   Nita Sickle K, DO  Cholecalciferol (VITAMIN D3 PO) Take by mouth.    [provider]  ferrous sulfate 325 (65 FE) MG tablet Take 325 mg by mouth 2 (two) times a week. Patient takes one tablet on Monday and Friday    [provider]  furosemide  (LASIX) 40 MG tablet Take 0.5 tablets (20 mg total) by mouth daily. 09/07/21   Ghimire, Werner Lean, MD  irbesartan (AVAPRO) 300 MG tablet Take 1 tablet (300 mg total) by mouth daily. 12/04/20   Shelva Majestic, MD  LINZESS 145 MCG CAPS capsule Take 145 mcg by mouth daily as needed (constipation). 02/22/21   [provider]  Multiple Vitamins-Minerals (PRESERVISION AREDS 2 PO) Take 1 tablet by mouth 2 (two) times daily.    [provider]  polyethylene glycol (MIRALAX / GLYCOLAX) 17 g packet Take 17 g by mouth 2 (two) times daily. Reported on 03/14/2016 Patient taking differently: Take 17 g by mouth daily as needed for mild constipation. 01/03/20   Hongalgi, Maximino [place], MD  potassium chloride SA (KLOR-CON) 10 MEQ tablet Take 1 tablet (10 mEq total) by mouth 2 (two) times daily as needed. Needs to be taken  With each dose of lasix 09/07/21   Ghimire, Henreitta Leber, MD  rosuvastatin (CRESTOR) 40 MG tablet Take 1 tablet (40 mg total) by mouth daily. Take 1 tablet by mouth daily. 12/05/20   Marin Olp, MD  Rotigotine (NEUPRO) 3 MG/24HR PT24 PLACE 1 PATCH (3 MG) ONTO THE SKIN AT BEDTIME Patient taking differently: Place 3 mg onto the skin at bedtime. 12/04/20   Marin Olp, MD  traZODone (DESYREL) 50 MG tablet Take 1.5 tablets (75 mg total) by mouth at bedtime as needed for sleep. 12/04/20   Marin Olp, MD  vitamin B-12 (CYANOCOBALAMIN) 1000 MCG tablet Take 1,000 mcg by mouth daily.    [provider]    Allergies    Codeine  Review of Systems   Review of Systems  Constitutional:  Positive for activity change, appetite change, fatigue and fever.  Respiratory:  Negative for cough and shortness of breath.   Cardiovascular:  Negative for chest pain.  Gastrointestinal:  Negative for abdominal pain, diarrhea, nausea and vomiting.  Genitourinary:  Negative for dysuria.  Skin:  Negative for rash.  Neurological:  Positive for headaches.   Physical Exam Updated Vital Signs BP  127/77 (BP Location: Right Arm)   Pulse 69   Temp 97.6 F (36.4 C) (Oral)   Resp 16   Ht 5\' 7"  (1.702 m)   Wt 57.5 kg   LMP  (LMP Unknown)   SpO2 98%   BMI 19.85 kg/m   Physical Exam Vitals and nursing note reviewed.  Constitutional:      General: She is not in acute distress.    Appearance: She is well-developed. She is ill-appearing. She is not diaphoretic.  HENT:     Head: Normocephalic and atraumatic.  Eyes:     Conjunctiva/sclera: Conjunctivae normal.  Cardiovascular:     Rate and Rhythm: Normal rate and regular rhythm.     Heart sounds: Normal heart sounds. No murmur heard.   No friction rub. No gallop.  Pulmonary:     Effort: Pulmonary effort is normal. No respiratory distress.     Breath sounds: Normal breath sounds. No wheezing or rales.  Abdominal:     General: There is no distension.     Palpations: Abdomen is soft.     Tenderness: There is no abdominal tenderness. There is no guarding.  Musculoskeletal:        General: No tenderness.     Cervical back: Normal range of motion.  Skin:    General: Skin is warm and dry.     Findings: No erythema or rash.  Neurological:     Mental Status: She is alert and oriented to person, place, and time.    ED Results / Procedures / Treatments   Labs (all labs ordered are listed, but only abnormal results are displayed) Labs Reviewed  URINE CULTURE - Abnormal; Notable for the following components:      Result Value   Culture MULTIPLE SPECIES PRESENT, SUGGEST RECOLLECTION (*)    All other components within normal limits  RESP PANEL BY RT-PCR (FLU A&B, COVID) ARPGX2 - Abnormal; Notable for the following components:   SARS Coronavirus 2 by RT PCR POSITIVE (*)    All other components within normal limits  LACTIC ACID, PLASMA - Abnormal; Notable for the following components:   Lactic Acid, Venous 2.1 (*)  All other components within normal limits  COMPREHENSIVE METABOLIC PANEL - Abnormal; Notable for the following  components:   Potassium 2.8 (*)    Glucose, Bld 126 (*)    Calcium 8.8 (*)    Albumin 3.2 (*)    All other components within normal limits  CBC WITH DIFFERENTIAL/PLATELET - Abnormal; Notable for the following components:   RBC 3.53 (*)    Hemoglobin 10.1 (*)    HCT 32.1 (*)    RDW 16.0 (*)    Lymphs Abs 0.2 (*)    All other components within normal limits  PROTIME-INR - Abnormal; Notable for the following components:   Prothrombin Time 17.0 (*)    INR 1.4 (*)    All other components within normal limits  URINALYSIS, ROUTINE W REFLEX MICROSCOPIC - Abnormal; Notable for the following components:   Protein, ur 30 (*)    All other components within normal limits  CBC WITH DIFFERENTIAL/PLATELET - Abnormal; Notable for the following components:   WBC 3.1 (*)    RBC 3.07 (*)    Hemoglobin 8.8 (*)    HCT 28.3 (*)    RDW 16.1 (*)    Platelets 134 (*)    Lymphs Abs 0.4 (*)    All other components within normal limits  COMPREHENSIVE METABOLIC PANEL - Abnormal; Notable for the following components:   Potassium 3.2 (*)    Calcium 8.0 (*)    Total Protein 5.7 (*)    Albumin 2.8 (*)    All other components within normal limits  D-DIMER, QUANTITATIVE - Abnormal; Notable for the following components:   D-Dimer, Quant 1.52 (*)    All other components within normal limits  MAGNESIUM - Abnormal; Notable for the following components:   Magnesium 1.5 (*)    All other components within normal limits  D-DIMER, QUANTITATIVE - Abnormal; Notable for the following components:   D-Dimer, Quant 1.55 (*)    All other components within normal limits  MAGNESIUM - Abnormal; Notable for the following components:   Magnesium 1.6 (*)    All other components within normal limits  CBC WITH DIFFERENTIAL/PLATELET - Abnormal; Notable for the following components:   Hemoglobin 11.4 (*)    HCT 35.5 (*)    RDW 15.9 (*)    Platelets 144 (*)    All other components within normal limits  COMPREHENSIVE METABOLIC  PANEL - Abnormal; Notable for the following components:   Glucose, Bld 101 (*)    Calcium 8.2 (*)    Albumin 3.3 (*)    All other components within normal limits  CBC WITH DIFFERENTIAL/PLATELET - Abnormal; Notable for the following components:   WBC 3.1 (*)    RBC 3.77 (*)    Hemoglobin 10.8 (*)    HCT 33.2 (*)    RDW 15.8 (*)    Platelets 127 (*)    All other components within normal limits  COMPREHENSIVE METABOLIC PANEL - Abnormal; Notable for the following components:   Sodium 134 (*)    Potassium 2.8 (*)    Chloride 97 (*)    Glucose, Bld 104 (*)    Calcium 7.9 (*)    Albumin 2.9 (*)    GFR, Estimated 58 (*)    All other components within normal limits  CBC WITH DIFFERENTIAL/PLATELET - Abnormal; Notable for the following components:   WBC 2.6 (*)    RBC 3.69 (*)    Hemoglobin 10.5 (*)    HCT 32.7 (*)    Platelets 132 (*)  Neutro Abs 1.1 (*)    All other components within normal limits  COMPREHENSIVE METABOLIC PANEL - Abnormal; Notable for the following components:   Potassium 3.3 (*)    Creatinine, Ser 1.02 (*)    Calcium 8.2 (*)    Total Protein 6.2 (*)    Albumin 2.8 (*)    GFR, Estimated 53 (*)    All other components within normal limits  BASIC METABOLIC PANEL - Abnormal; Notable for the following components:   Glucose, Bld 101 (*)    BUN 24 (*)    Creatinine, Ser 1.19 (*)    Calcium 8.5 (*)    GFR, Estimated 44 (*)    All other components within normal limits  MAGNESIUM - Abnormal; Notable for the following components:   Magnesium 2.5 (*)    All other components within normal limits  CBC - Abnormal; Notable for the following components:   WBC 3.7 (*)    RBC 3.67 (*)    Hemoglobin 10.4 (*)    HCT 33.0 (*)    Platelets 137 (*)    All other components within normal limits  BASIC METABOLIC PANEL - Abnormal; Notable for the following components:   Glucose, Bld 122 (*)    BUN 26 (*)    Calcium 8.7 (*)    GFR, Estimated 56 (*)    All other components  within normal limits  BASIC METABOLIC PANEL - Abnormal; Notable for the following components:   Glucose, Bld 104 (*)    BUN 33 (*)    Creatinine, Ser 1.29 (*)    GFR, Estimated 40 (*)    All other components within normal limits  CULTURE, BLOOD (SINGLE)  LACTIC ACID, PLASMA  APTT  AMMONIA  C-REACTIVE PROTEIN  FERRITIN  PHOSPHORUS  FERRITIN  FIBRINOGEN  C-REACTIVE PROTEIN  MAGNESIUM  MAGNESIUM  MAGNESIUM  TSH    EKG EKG Interpretation  Date/Time:  Wednesday August 29 2021 13:56:19 EDT Ventricular Rate:  93 PR Interval:    QRS Duration: 108 QT Interval:  378 QTC Calculation: 471 R Axis:   -18 Text Interpretation: Atrial fibrillation Borderline left axis deviation Consider anterior infarct No significant change since last tracing Confirmed by Gareth Morgan [phone]) on 08/29/2021 3:15:22 PM  Radiology No results found.  Procedures Procedures   Medications Ordered in ED Medications  molnupiravir EUA (LAGEVRIO) capsule 800 mg (800 mg Oral Given 09/03/21 1038)  lactated ringers infusion (0 mLs Intravenous Stopped 08/30/21 0647)  potassium chloride 10 MEQ/100ML IVPB (  Not Given 09/01/21 1811)  lactated ringers infusion ( Intravenous Infusion Verify 09/08/21 1100)  acetaminophen (TYLENOL) tablet 325 mg (325 mg Oral Given 08/29/21 1400)  potassium chloride 10 mEq in 100 mL IVPB (0 mEq Intravenous Stopped 08/29/21 1757)  LORazepam (ATIVAN) injection 0.5 mg (0.5 mg Intravenous Given 08/29/21 1548)  cefTRIAXone (ROCEPHIN) 2 g in sodium chloride 0.9 % 100 mL IVPB (0 g Intravenous Stopped 08/29/21 1757)  azithromycin (ZITHROMAX) 500 mg in sodium chloride 0.9 % 250 mL IVPB (0 mg Intravenous Stopped 08/29/21 1757)  sodium chloride 0.9 % bolus 1,000 mL (0 mLs Intravenous Stopped 08/29/21 1927)  iohexol (OMNIPAQUE) 350 MG/ML injection 80 mL (80 mLs Intravenous Contrast Given 08/29/21 1643)  potassium chloride SA (KLOR-CON) CR tablet 20 mEq (20 mEq Oral Given 08/29/21 2005)  potassium  chloride SA (KLOR-CON) CR tablet 40 mEq (40 mEq Oral Given 08/30/21 1431)  magnesium sulfate IVPB 4 g 100 mL (0 g Intravenous Stopped 08/30/21 1025)  hydrALAZINE (APRESOLINE) injection 10  mg (10 mg Intravenous Given 08/30/21 2023)  hydrALAZINE (APRESOLINE) injection 10 mg (10 mg Intravenous Given 08/31/21 0021)  HYDROmorphone (DILAUDID) injection 0.5 mg (0.5 mg Intravenous Given 08/31/21 1811)  traMADol (ULTRAM) tablet 50 mg (50 mg Oral Given 09/01/21 0826)  potassium chloride 10 mEq in 100 mL IVPB (10 mEq Intravenous New Bag/Given 09/01/21 1803)  magnesium sulfate IVPB 2 g 50 mL (0 g Intravenous Stopped 09/02/21 0915)  potassium chloride SA (KLOR-CON) CR tablet 40 mEq (40 mEq Oral Given 09/02/21 1239)    ED Course  I have reviewed the triage vital signs and the nursing notes.  Pertinent labs & imaging results that were available during my care of the patient were reviewed by me and considered in my medical decision making (see chart for details).    MDM Rules/Calculators/A&P                            87yo female with history of CHF, COPD, hlpd, htn, PAD, RLS, thyroid disease, presents for generalized weakness from home.  Home health nurse and family tried to get her up from wheelchair to toilet and were unsuccessful due to generalized weakness.  Arrived febrile, tachycardic.NO leukocytosis. Hypokalemia. COVID testing pending. Signed out to Dr. Ronnald Nian for further evaluation.   Final Clinical Impression(s) / ED Diagnoses Final diagnoses:  Hypokalemia  Weakness  Fever, unspecified fever cause  COVID-19    Rx / DC Orders ED Discharge Orders          Ordered    apixaban (ELIQUIS) 2.5 MG TABS tablet  2 times daily        09/07/21 1416    furosemide (LASIX) 40 MG tablet  Daily        09/07/21 1416    potassium chloride SA (KLOR-CON) 10 MEQ tablet  2 times daily PRN        09/07/21 1416    Increase activity slowly        09/07/21 1416    Diet - low sodium heart healthy         09/07/21 1416    Call MD for:  difficulty breathing, headache or visual disturbances        09/07/21 1416             Gareth Morgan, MD 10/14/21 2110

## 2021-10-15 DIAGNOSIS — M48061 Spinal stenosis, lumbar region without neurogenic claudication: Secondary | ICD-10-CM | POA: Diagnosis not present

## 2021-10-15 DIAGNOSIS — J449 Chronic obstructive pulmonary disease, unspecified: Secondary | ICD-10-CM | POA: Diagnosis not present

## 2021-10-15 DIAGNOSIS — I5032 Chronic diastolic (congestive) heart failure: Secondary | ICD-10-CM | POA: Diagnosis not present

## 2021-10-15 DIAGNOSIS — U071 COVID-19: Secondary | ICD-10-CM | POA: Diagnosis not present

## 2021-10-15 DIAGNOSIS — I70213 Atherosclerosis of native arteries of extremities with intermittent claudication, bilateral legs: Secondary | ICD-10-CM | POA: Diagnosis not present

## 2021-10-15 DIAGNOSIS — I110 Hypertensive heart disease with heart failure: Secondary | ICD-10-CM | POA: Diagnosis not present

## 2021-10-16 ENCOUNTER — Telehealth: Payer: Self-pay

## 2021-10-16 DIAGNOSIS — J449 Chronic obstructive pulmonary disease, unspecified: Secondary | ICD-10-CM | POA: Diagnosis not present

## 2021-10-16 DIAGNOSIS — I70213 Atherosclerosis of native arteries of extremities with intermittent claudication, bilateral legs: Secondary | ICD-10-CM | POA: Diagnosis not present

## 2021-10-16 DIAGNOSIS — I5032 Chronic diastolic (congestive) heart failure: Secondary | ICD-10-CM | POA: Diagnosis not present

## 2021-10-16 DIAGNOSIS — U071 COVID-19: Secondary | ICD-10-CM | POA: Diagnosis not present

## 2021-10-16 DIAGNOSIS — M48061 Spinal stenosis, lumbar region without neurogenic claudication: Secondary | ICD-10-CM | POA: Diagnosis not present

## 2021-10-16 DIAGNOSIS — I110 Hypertensive heart disease with heart failure: Secondary | ICD-10-CM | POA: Diagnosis not present

## 2021-10-16 NOTE — Telephone Encounter (Signed)
Spoke with patient and scheduled an in-person Palliative Consult for 11/07/21 @ 9AM  COVID screening was negative. Pets in the home will put dogs away before NP arrives. Patient live alone but has a caregiver Stanton Kidney that stays with her.   Consent obtained; updated Outlook/Netsmart/Team List and Epic.   Patient is aware she may be receiving a call from provider the day before or day of to confirm appointment.

## 2021-10-17 ENCOUNTER — Other Ambulatory Visit: Payer: Self-pay

## 2021-10-17 ENCOUNTER — Encounter: Payer: Self-pay | Admitting: Neurology

## 2021-10-17 ENCOUNTER — Ambulatory Visit (INDEPENDENT_AMBULATORY_CARE_PROVIDER_SITE_OTHER): Payer: Medicare Other | Admitting: Neurology

## 2021-10-17 VITALS — BP 157/84 | HR 77 | Ht 67.0 in | Wt 130.0 lb

## 2021-10-17 DIAGNOSIS — I701 Atherosclerosis of renal artery: Secondary | ICD-10-CM | POA: Diagnosis not present

## 2021-10-17 DIAGNOSIS — G2581 Restless legs syndrome: Secondary | ICD-10-CM

## 2021-10-17 NOTE — Progress Notes (Signed)
Follow-up Visit   Date: 10/17/21   Cheryl Atkinson MRN: [id number] DOB: 1931-08-26   Interim History: Cheryl Atkinson is a 87 y.o. right-handed Caucasian female with CHF, COPD, GERD, HTN, and HPL returning to the clinic for follow-up of RLS.  The patient was accompanied to the clinic by caregiver Gavin Pound) who also provides collateral information.    History of present illness: She has RLS for the past 15 years, descirbed as crawling sensation and discomfort.  It is worse at rest and improved with activity.  She takes Neupro 3mg  patch for the past several years, but does not have any ongoing benefit.  She was also on ropinirole in the past.  Upon further questioning, she tells me that over the past few months, she has been putting 2-4 patches of Neupro 3mg  on, where has she is prescribed to apply one patch only.  She does not have any benefit. She is predominately wheelchair bound since a fall resulting in lumbar fracture earlier this year.  She walks with assistance and a walker at home.   She lives at home with her son.  Her cousin, , is her caregiver who is home all day.  She needs assistance with ADLs.    UPDATE 10/17/2021:  She has reduced Neupro to 3mg /daily and it took about a month to see benefit, but she has marked improvement in RLS.  She no longer kicks or moves her legs and no longer needs to walk. Previously, she would need to walk about an hour at night time and it would wake her up at night time. She takes sinemet about once per week which helps.   Medications:  Current Outpatient Medications on File Prior to Visit  Medication Sig Dispense Refill   Albuterol Sulfate (PROAIR RESPICLICK) 108 (90 Base) MCG/ACT AEPB Inhale 2 puffs into the lungs every 6 (six) hours as needed (shortness of breath from COPD). 1 each 5   apixaban (ELIQUIS) 2.5 MG TABS tablet Take 1 tablet (2.5 mg total) by mouth 2 (two) times daily.     CALCIUM PO Take by mouth.     carbidopa-levodopa  (SINEMET IR) 25-100 MG tablet Take daily as needed for worsening RLS. 90 tablet 1   Cholecalciferol (VITAMIN D3 PO) Take by mouth.     ferrous sulfate 325 (65 FE) MG tablet Take 325 mg by mouth 2 (two) times a week. Patient takes one tablet on Monday and Friday     furosemide (LASIX) 40 MG tablet Take 0.5 tablets (20 mg total) by mouth daily. 180 tablet 3   irbesartan (AVAPRO) 300 MG tablet Take 1 tablet (300 mg total) by mouth daily. 90 tablet 3   LINZESS 145 MCG CAPS capsule Take 145 mcg by mouth daily as needed (constipation).     Multiple Vitamins-Minerals (PRESERVISION AREDS 2 PO) Take 1 tablet by mouth 2 (two) times daily.     polyethylene glycol (MIRALAX / GLYCOLAX) 17 g packet Take 17 g by mouth 2 (two) times daily. Reported on 03/14/2016 (Patient taking differently: Take 17 g by mouth daily as needed for mild constipation.)     potassium chloride SA (KLOR-CON) 10 MEQ tablet Take 1 tablet (10 mEq total) by mouth 2 (two) times daily as needed. Needs to be taken  With each dose of lasix     rosuvastatin (CRESTOR) 40 MG tablet Take 1 tablet (40 mg total) by mouth daily. Take 1 tablet by mouth daily. 90 tablet 3   Rotigotine (NEUPRO)  3 MG/24HR PT24 PLACE 1 PATCH (3 MG) ONTO THE SKIN AT BEDTIME (Patient taking differently: Place 3 mg onto the skin at bedtime.) 90 patch 3   traZODone (DESYREL) 50 MG tablet Take 1.5 tablets (75 mg total) by mouth at bedtime as needed for sleep. 135 tablet 3   vitamin B-12 (CYANOCOBALAMIN) 1000 MCG tablet Take 1,000 mcg by mouth daily.     No current facility-administered medications on file prior to visit.    Allergies:  Allergies  Allergen Reactions   Codeine     hallucinations    Vital Signs:  BP (!) 157/84   Pulse 77   Ht 5\' 7"  (1.702 m)   Wt 130 lb (59 kg)   LMP  (LMP Unknown)   SpO2 90%   BMI 20.36 kg/m   Neurological Exam: MENTAL STATUS including orientation to time, place, person, recent and remote memory, attention span and concentration,  language, and fund of knowledge is normal.  Speech is not dysarthric.  CRANIAL NERVES:  Pupils equal round and reactive to light.  Normal conjugate, extra-ocular eye movements in all directions of gaze.  No ptosis.  Face is symmetric.   MOTOR:  Motor strength is 5/5 in the upper extremities, 4/5 in the legs proximally, 5/5 distally.  No atrophy, fasciculations or abnormal movements.  No pronator drift.  Tone is normal.    MSRs:  Reflexes are 3+/4 throughout, except absent at the ankles.  SENSORY:  Intact to vibration throughout.  COORDINATION/GAIT:  Normal finger-to- nose-finger.  Gait not tested, pt in wheelchair and uses a walker at home.   Data: Lab Results  Component Value Date   FERRITIN 117 08/30/2021    IMPRESSION/PLAN: Restless leg syndrome, significantly improved since reducing the dose of Neupro from 12mg  down to 3mg  due to concerns of augmentation.  She is sleeping so much better and very grateful for the relief.  She will stay on Neurpro 3mg /d and continue to take sinemet 25/100 as needed.  Return to clinic in 1 year.    Thank you for allowing me to participate in patient's care.  If I can answer any additional questions, I would be pleased to do so.    Sincerely,    Maryana Pittmon K. 09/01/2021, DO

## 2021-10-17 NOTE — Patient Instructions (Signed)
Continue taking Neupro 3mg  patch daily  You may take carbidopa-levodopa as needed for restless leg, such as when going to church or a long car drive  Return to clinic in 1 year

## 2021-10-18 ENCOUNTER — Ambulatory Visit: Payer: Medicare Other | Admitting: Family Medicine

## 2021-10-18 DIAGNOSIS — I110 Hypertensive heart disease with heart failure: Secondary | ICD-10-CM | POA: Diagnosis not present

## 2021-10-18 DIAGNOSIS — J449 Chronic obstructive pulmonary disease, unspecified: Secondary | ICD-10-CM | POA: Diagnosis not present

## 2021-10-18 DIAGNOSIS — U071 COVID-19: Secondary | ICD-10-CM | POA: Diagnosis not present

## 2021-10-18 DIAGNOSIS — I70213 Atherosclerosis of native arteries of extremities with intermittent claudication, bilateral legs: Secondary | ICD-10-CM | POA: Diagnosis not present

## 2021-10-18 DIAGNOSIS — I5032 Chronic diastolic (congestive) heart failure: Secondary | ICD-10-CM | POA: Diagnosis not present

## 2021-10-18 DIAGNOSIS — M48061 Spinal stenosis, lumbar region without neurogenic claudication: Secondary | ICD-10-CM | POA: Diagnosis not present

## 2021-10-22 ENCOUNTER — Telehealth: Payer: Self-pay

## 2021-10-22 DIAGNOSIS — U071 COVID-19: Secondary | ICD-10-CM | POA: Diagnosis not present

## 2021-10-22 DIAGNOSIS — J449 Chronic obstructive pulmonary disease, unspecified: Secondary | ICD-10-CM | POA: Diagnosis not present

## 2021-10-22 DIAGNOSIS — M48061 Spinal stenosis, lumbar region without neurogenic claudication: Secondary | ICD-10-CM | POA: Diagnosis not present

## 2021-10-22 DIAGNOSIS — I5032 Chronic diastolic (congestive) heart failure: Secondary | ICD-10-CM | POA: Diagnosis not present

## 2021-10-22 DIAGNOSIS — I70213 Atherosclerosis of native arteries of extremities with intermittent claudication, bilateral legs: Secondary | ICD-10-CM | POA: Diagnosis not present

## 2021-10-22 DIAGNOSIS — I110 Hypertensive heart disease with heart failure: Secondary | ICD-10-CM | POA: Diagnosis not present

## 2021-10-22 NOTE — Telephone Encounter (Signed)
If injuries noted later - have her schedule visit please

## 2021-10-22 NOTE — Telephone Encounter (Signed)
Calling to report that on Saturday 11/19, patient slipped to the floor.  States no injury to report.

## 2021-10-22 NOTE — Telephone Encounter (Signed)
FYI

## 2021-10-23 ENCOUNTER — Ambulatory Visit (INDEPENDENT_AMBULATORY_CARE_PROVIDER_SITE_OTHER): Payer: Medicare Other | Admitting: Orthopaedic Surgery

## 2021-10-23 ENCOUNTER — Ambulatory Visit (INDEPENDENT_AMBULATORY_CARE_PROVIDER_SITE_OTHER): Payer: Medicare Other | Admitting: *Deleted

## 2021-10-23 ENCOUNTER — Other Ambulatory Visit: Payer: Self-pay

## 2021-10-23 DIAGNOSIS — I5032 Chronic diastolic (congestive) heart failure: Secondary | ICD-10-CM

## 2021-10-23 DIAGNOSIS — I701 Atherosclerosis of renal artery: Secondary | ICD-10-CM

## 2021-10-23 DIAGNOSIS — M21372 Foot drop, left foot: Secondary | ICD-10-CM | POA: Diagnosis not present

## 2021-10-23 DIAGNOSIS — S42009A Fracture of unspecified part of unspecified clavicle, initial encounter for closed fracture: Secondary | ICD-10-CM

## 2021-10-23 NOTE — Progress Notes (Signed)
Office Visit Note   Patient: Cheryl Atkinson           Date of Birth: 1930/12/21           MRN: [id number] Visit Date: 10/23/2021              Requested by: Shelva Majestic, MD [address] [place],  [place] [address] PCP: Shelva Majestic, MD   Assessment & Plan: Visit Diagnoses:  1. Left foot drop     Plan: Impression is left foot drop.  We will provide her with a prescription for AFO.  Follow-up as needed.  Follow-Up Instructions: No follow-ups on file.   Orders:  No orders of the defined types were placed in this encounter.  No orders of the defined types were placed in this encounter.     Procedures: No procedures performed   Clinical Data: No additional findings.   Subjective: No chief complaint on file.   HPI  Cheryl Atkinson comes in for evaluation of left foot drop.  This is giving her trouble with ambulation and has caused her fall 3 times.  Review of Systems   Objective: Vital Signs: LMP  (LMP Unknown)   Physical Exam  Ortho Exam  Left foot is consistent with weakness and ankle dorsiflexion.  She is able to dorsiflex against gravity but at rest the foot is in equinus.  Specialty Comments:  No specialty comments available.  Imaging: No results found.   PMFS History: Patient Active Problem List   Diagnosis Date Noted   COVID-19 08/30/2021   COVID-19 virus infection 08/29/2021   Renal lesion 08/29/2021   B12 deficiency 02/27/2021   Pleural effusion due to CHF (congestive heart failure) (HCC) 05/26/2020   S/P laparoscopic cholecystectomy 04/04/2020   Heart failure with preserved ejection fraction (HCC) 03/01/2020   Insomnia 03/01/2020   Bacteremia 01/28/2020   Atrial fibrillation (HCC) 01/25/2020   Abdominal aortic ectasia (HCC) 12/29/2019   Major depression 03/25/2018   Hypokalemia 12/21/2016   Cat bite of right hand 12/21/2016   BPPV (benign paroxysmal positional vertigo) 07/12/2016   Memory loss 04/11/2016   Mallet toe of right  foot 10/20/2015   Renal artery stenosis (HCC) 09/27/2015   Hyperlipidemia 06/30/2015   Claudication (HCC) 05/04/2015   Atherosclerotic PVD with intermittent claudication (HCC) 04/26/2015   Tinnitus 12/31/2014   Former smoker 09/29/2014   DNR (do not resuscitate) 09/29/2014   Hyperglycemia 07/19/2014   Multinodular goiter 08/30/2013   Spinal stenosis of lumbar region at multiple levels 09/29/2012   HIP PAIN, BILATERAL 07/16/2010   COPD (chronic obstructive pulmonary disease) (HCC) 09/20/2009   CONSTIPATION, CHRONIC 09/20/2009   Iron deficiency anemia 11/09/2007   History of UTI 11/09/2007   RESTLESS LEG SYNDROME 09/25/2007   Essential hypertension 09/25/2007   GERD 09/25/2007   LOW BACK PAIN 09/25/2007   Osteoporosis 09/25/2007   Past Medical History:  Diagnosis Date   Acute cholecystitis 12/29/2019   Anemia    Arthritis    "shoulders" (05/04/2015)   Cellulitis of right lower extremity 11/27/2013   CHF (congestive heart failure) (HCC)    Chronic lower back pain    Constipation    COPD (chronic obstructive pulmonary disease) (HCC)    GERD (gastroesophageal reflux disease)    History of hiatal hernia    HLD (hyperlipidemia)    Hypertension    Insomnia    Osteoporosis    Peripheral arterial disease (HCC)    Restless leg syndrome    Thyroid disease  Family History  Problem Relation Age of Onset   Heart disease Mother    Heart disease Father    Heart attack Father    Cancer Sister        Breast Cancer   Atrial fibrillation Sister    Stroke Maternal Grandmother    Cancer Paternal Grandmother    Coronary artery disease Other     Past Surgical History:  Procedure Laterality Date   ABDOMINAL AORTAGRAM  05/04/2015   Procedure: Abdominal Aortagram;  Surgeon: Lorretta Harp, MD;  Location: [facility];  Service: Cardiovascular;;   APPENDECTOMY     CATARACT EXTRACTION W/ INTRAOCULAR LENS  IMPLANT, BILATERAL Bilateral    CHOLECYSTECTOMY N/A 04/04/2020   Procedure:  LAPAROSCOPIC CHOLECYSTECTOMY;  Surgeon: Georganna Skeans, MD;  Location: [facility];  Service: General;  Laterality: N/A;   I & D EXTREMITY Right 12/22/2016   Procedure: IRRIGATION AND DEBRIDEMENT EXTREMITY;  Surgeon: Milly Jakob, MD;  Location: [facility];  Service: Orthopedics;  Laterality: Right;   IR EXCHANGE BILIARY DRAIN  03/13/2020   IR PATIENT EVAL TECH 0-60 MINS  12/30/2019   IR PERC CHOLECYSTOSTOMY  01/27/2020   IR RADIOLOGIST EVAL & MGMT  05/16/2020   PERIPHERAL VASCULAR CATHETERIZATION N/A 05/04/2015   Procedure: Lower Extremity Angiography;  Surgeon: Lorretta Harp, MD;  Location: [facility];  Service: Cardiovascular;  Laterality: N/A;   PERIPHERAL VASCULAR CATHETERIZATION  05/04/2015   Procedure: Peripheral Vascular Intervention;  Surgeon: Lorretta Harp, MD;  Location: [facility];  Service: Cardiovascular;;  RCIA - 7x22 ICAST   PERIPHERAL VASCULAR CATHETERIZATION Right 09/04/2015   Procedure: Peripheral Vascular Atherectomy;  Surgeon: Lorretta Harp, MD;  Location: [facility];  Service: Cardiovascular;  Laterality: Right;  SFA   sfa Right 09/04/2015   de balloon   THYROID SURGERY Right ?2013   "had goiter taken off my neck"   TONSILLECTOMY     VAGINAL HYSTERECTOMY     Social History   Occupational History   Occupation: retired  Tobacco Use   Smoking status: Former    Packs/day: 0.50    Years: 60.00    Pack years: 30.00    Types: Cigarettes    Quit date: 04/02/2015    Years since quitting: 6.5   Smokeless tobacco: Never  Vaping Use   Vaping Use: Never used  Substance and Sexual Activity   Alcohol use: No   Drug use: No   Sexual activity: Not Currently

## 2021-10-24 DIAGNOSIS — U071 COVID-19: Secondary | ICD-10-CM | POA: Diagnosis not present

## 2021-10-24 DIAGNOSIS — M48061 Spinal stenosis, lumbar region without neurogenic claudication: Secondary | ICD-10-CM | POA: Diagnosis not present

## 2021-10-24 DIAGNOSIS — I70213 Atherosclerosis of native arteries of extremities with intermittent claudication, bilateral legs: Secondary | ICD-10-CM | POA: Diagnosis not present

## 2021-10-24 DIAGNOSIS — I110 Hypertensive heart disease with heart failure: Secondary | ICD-10-CM | POA: Diagnosis not present

## 2021-10-24 DIAGNOSIS — J449 Chronic obstructive pulmonary disease, unspecified: Secondary | ICD-10-CM | POA: Diagnosis not present

## 2021-10-24 DIAGNOSIS — I5032 Chronic diastolic (congestive) heart failure: Secondary | ICD-10-CM | POA: Diagnosis not present

## 2021-10-24 NOTE — Patient Instructions (Addendum)
Visit Information  Thank you for taking time to visit with me today. Please don't hesitate to contact me if I can be of assistance to you before our next scheduled telephone appointment.  Following are the goals we discussed today:  Take medications as prescribed   Attend all scheduled provider appointments Perform all self care activities independently  call office if I gain more than 2 pounds in one day or 5 pounds in one week weigh myself daily bring diary to all appointments develop a rescue plan follow rescue plan if symptoms flare-up know when to call the doctor based on weight and symptoms track symptoms and what helps feel better or worse Obtain no skid socks  Our next appointment is by telephone on 11/13/21 at 1300  Please call the care guide team at [phone] if you need to cancel or reschedule your appointment.   Please call the Suicide and Crisis Lifeline: 988 call the [place] National Suicide Prevention Lifeline: 763-449-8121 or TTY: 219-228-8609 TTY 848-676-4224) to talk to a trained counselor call 1-800-273-TALK (toll free, 24 hour hotline) go to [facility] [address] [place] [phone]) call 911 if you are experiencing a Mental Health or Behavioral Health Crisis or need someone to talk to.  The patient verbalized understanding of instructions, educational materials, and care plan provided today and declined offer to receive copy of patient instructions, educational materials, and care plan.   Rhae Lerner RN, MSN RN Care Management Coordinator  [facility] [phone] [email or web address]

## 2021-10-28 NOTE — Chronic Care Management (AMB) (Signed)
Chronic Care Management   CCM RN Visit Note  10/28/2021 Name: Cheryl Atkinson MRN: [id number] DOB: 04/08/1931  Subjective: Cheryl Atkinson is a 87 y.o. year old female who is a primary care patient of Durene Cal, [name], MD. The care management team was consulted for assistance with disease management and care coordination needs.    Engaged with patient by telephone for follow up visit in response to provider referral for case management and/or care coordination services.   Consent to Services:  The patient was given information about Chronic Care Management services, agreed to services, and gave verbal consent prior to initiation of services.  Please see initial visit note for detailed documentation.   Patient agreed to services and verbal consent obtained.   Assessment: Review of patient past medical history, allergies, medications, health status, including review of consultants reports, laboratory and other test data, was performed as part of comprehensive evaluation and provision of chronic care management services.   SDOH (Social Determinants of Health) assessments and interventions performed:    CCM Care Plan  Allergies  Allergen Reactions   Codeine     hallucinations    Outpatient Encounter Medications as of 10/23/2021  Medication Sig Note   apixaban (ELIQUIS) 2.5 MG TABS tablet Take 1 tablet (2.5 mg total) by mouth 2 (two) times daily.    carbidopa-levodopa (SINEMET IR) 25-100 MG tablet Take daily as needed for worsening RLS. 08/30/2021: Last filled 8.15.22 for 90 days supply   furosemide (LASIX) 40 MG tablet Take 0.5 tablets (20 mg total) by mouth daily.    irbesartan (AVAPRO) 300 MG tablet Take 1 tablet (300 mg total) by mouth daily. 08/30/2021: Last filled 7.2.22 for 90 days supply   polyethylene glycol (MIRALAX / GLYCOLAX) 17 g packet Take 17 g by mouth 2 (two) times daily. Reported on 03/14/2016 (Patient taking differently: Take 17 g by mouth daily as needed for mild  constipation.)    traZODone (DESYREL) 50 MG tablet Take 1.5 tablets (75 mg total) by mouth at bedtime as needed for sleep. 08/30/2021: Last filled 8.20.22 for 90 days supply   Albuterol Sulfate (PROAIR RESPICLICK) 108 (90 Base) MCG/ACT AEPB Inhale 2 puffs into the lungs every 6 (six) hours as needed (shortness of breath from COPD).    CALCIUM PO Take by mouth.    Cholecalciferol (VITAMIN D3 PO) Take by mouth.    ferrous sulfate 325 (65 FE) MG tablet Take 325 mg by mouth 2 (two) times a week. Patient takes one tablet on Monday and Friday    LINZESS 145 MCG CAPS capsule Take 145 mcg by mouth daily as needed (constipation).    Multiple Vitamins-Minerals (PRESERVISION AREDS 2 PO) Take 1 tablet by mouth 2 (two) times daily.    potassium chloride SA (KLOR-CON) 10 MEQ tablet Take 1 tablet (10 mEq total) by mouth 2 (two) times daily as needed. Needs to be taken  With each dose of lasix    rosuvastatin (CRESTOR) 40 MG tablet Take 1 tablet (40 mg total) by mouth daily. Take 1 tablet by mouth daily. 08/30/2021: Last filled 9.14.22 for 90 days supply   Rotigotine (NEUPRO) 3 MG/24HR PT24 PLACE 1 PATCH (3 MG) ONTO THE SKIN AT BEDTIME (Patient taking differently: Place 3 mg onto the skin at bedtime.) 08/30/2021: Last filled 7.2.22 for 90 days supply   vitamin B-12 (CYANOCOBALAMIN) 1000 MCG tablet Take 1,000 mcg by mouth daily.    No facility-administered encounter medications on file as of 10/23/2021.    Patient  Active Problem List   Diagnosis Date Noted   COVID-19 08/30/2021   COVID-19 virus infection 08/29/2021   Renal lesion 08/29/2021   B12 deficiency 02/27/2021   Pleural effusion due to CHF (congestive heart failure) (HCC) 05/26/2020   S/P laparoscopic cholecystectomy 04/04/2020   Heart failure with preserved ejection fraction (HCC) 03/01/2020   Insomnia 03/01/2020   Bacteremia 01/28/2020   Atrial fibrillation (HCC) 01/25/2020   Abdominal aortic ectasia (HCC) 12/29/2019   Major depression 03/25/2018    Hypokalemia 12/21/2016   Cat bite of right hand 12/21/2016   BPPV (benign paroxysmal positional vertigo) 07/12/2016   Memory loss 04/11/2016   Mallet toe of right foot 10/20/2015   Renal artery stenosis (HCC) 09/27/2015   Hyperlipidemia 06/30/2015   Claudication (HCC) 05/04/2015   Atherosclerotic PVD with intermittent claudication (HCC) 04/26/2015   Tinnitus 12/31/2014   Former smoker 09/29/2014   DNR (do not resuscitate) 09/29/2014   Hyperglycemia 07/19/2014   Multinodular goiter 08/30/2013   Spinal stenosis of lumbar region at multiple levels 09/29/2012   HIP PAIN, BILATERAL 07/16/2010   COPD (chronic obstructive pulmonary disease) (HCC) 09/20/2009   CONSTIPATION, CHRONIC 09/20/2009   Iron deficiency anemia 11/09/2007   History of UTI 11/09/2007   RESTLESS LEG SYNDROME 09/25/2007   Essential hypertension 09/25/2007   GERD 09/25/2007   LOW BACK PAIN 09/25/2007   Osteoporosis 09/25/2007    Conditions to be addressed/monitored:CHF and Falls  Care Plan : [facility]  General Plan of Care (Adult)  Updates made by Maple Mirza, RN since 10/28/2021 12:00 AM   Problem: Inability to care for self based on knowledge deficit   Priority: High   Long-Range Goal: Patient will work with CCM team to gain knowledge of self care management of chronic conditions   Start Date: 10/23/2021  Expected End Date: 10/23/2022  Priority: High  Note:   Current Barriers:  Knowledge Deficits related to plan of care for management of CHF and Falls  Chronic Disease Management support and education needs related to CHF and Falls Literacy barriers    Patient with recent discharge to home from Rehab facility due to fracture of shoulder and finger.  Back home with family caring for her.  Receiving home health PT.  Weight remains steady at 130 pounds.  Does report fall Monday night trying to get in bed.  No injuries  RNCM Clinical Goal(s):  Patient will verbalize understanding of plan for management of CHF  and Falls as evidenced by daily weight monitoring verbalize basic understanding of CHF and falls disease process and self health management plan as evidenced by monitoring weights daily and no hospital readmissions take all medications exactly as prescribed and will call provider for medication related questions as evidenced by medication compliance    demonstrate improved health management independence as evidenced by no hospital admissions or falls in the next 90days        through collaboration with RN Care manager, provider, and care team.   Interventions: 1:1 collaboration with primary care provider regarding development and update of comprehensive plan of care as evidenced by provider attestation and co-signature Inter-disciplinary care team collaboration (see longitudinal plan of care) Evaluation of current treatment plan related to  self management and patient's adherence to plan as established by provider   Heart Failure Interventions:  (Status: Goal on Track (progressing): YES.)  Long Term Goal  Basic overview and discussion of pathophysiology of Heart Failure reviewed Provided education on low sodium diet Reviewed Heart Failure Action Plan in depth  and provided written copy Advised patient to weigh each morning after emptying bladder Discussed importance of daily weight and advised patient to weigh and record daily Reviewed role of diuretics in prevention of fluid overload and management of heart failure Discussed the importance of keeping all appointments with provider Provided patient with education about the role of exercise in the management of heart failure  Falls:  (Status: Goal on Track (progressing): YES.) Long Term Goal  Provided written and verbal education re: potential causes of falls and Fall prevention strategies Reviewed medications and discussed potential side effects of medications such as dizziness and frequent urination Advised patient of importance of notifying  provider of falls Assessed for signs and symptoms of orthostatic hypotension Assessed for falls since last encounter Assessed patients knowledge of fall risk prevention secondary to previously provided education Fall precautions and preventions reviewed and discussed  Patient Goals/Self-Care Activities: Take medications as prescribed   Attend all scheduled provider appointments Perform all self care activities independently  call office if I gain more than 2 pounds in one day or 5 pounds in one week weigh myself daily bring diary to all appointments develop a rescue plan follow rescue plan if symptoms flare-up know when to call the doctor based on weight and symptoms track symptoms and what helps feel better or worse Obtain no skid socks       Plan:The care management team will reach out to the patient again over the next 45 days.  Rhae Lerner RN, MSN RN Care Management Coordinator  [facility] [phone] [email or web address]

## 2021-10-29 ENCOUNTER — Other Ambulatory Visit: Payer: Self-pay

## 2021-10-29 DIAGNOSIS — I70213 Atherosclerosis of native arteries of extremities with intermittent claudication, bilateral legs: Secondary | ICD-10-CM | POA: Diagnosis not present

## 2021-10-29 DIAGNOSIS — J449 Chronic obstructive pulmonary disease, unspecified: Secondary | ICD-10-CM | POA: Diagnosis not present

## 2021-10-29 DIAGNOSIS — I5032 Chronic diastolic (congestive) heart failure: Secondary | ICD-10-CM | POA: Diagnosis not present

## 2021-10-29 DIAGNOSIS — U071 COVID-19: Secondary | ICD-10-CM | POA: Diagnosis not present

## 2021-10-29 DIAGNOSIS — I110 Hypertensive heart disease with heart failure: Secondary | ICD-10-CM | POA: Diagnosis not present

## 2021-10-29 DIAGNOSIS — M48061 Spinal stenosis, lumbar region without neurogenic claudication: Secondary | ICD-10-CM | POA: Diagnosis not present

## 2021-10-29 MED ORDER — TRAMADOL HCL 50 MG PO TABS: 50.0000 mg | ORAL_TABLET | Freq: Four times a day (QID) | ORAL | 0 refills | Status: DC | PRN

## 2021-10-29 NOTE — Telephone Encounter (Signed)
Please advise,  Tramadol is not listed as a medication patient is currently taking.

## 2021-10-29 NOTE — Telephone Encounter (Signed)
Looks like it was filled in the hospital. Will fill in Dr. Erasmo Leventhal absence. Further refills to be assess by him.

## 2021-10-29 NOTE — Telephone Encounter (Signed)
Last OV 08/16/2021 dx atrial fib Last Refill 08/31/2021

## 2021-10-29 NOTE — Telephone Encounter (Signed)
Nurse: Lorin Picket, RN, Elnita Maxwell Date/Time (Eastern Time): 10/26/2021 3:55:14 PM Confirm and document reason for call. If symptomatic, describe symptoms. ---Caller states she needs a refill on Tramadol 50mg , has pain all over her body that has been ongoing, pain mainly located in her back, is taking Tramadol Tid and has 2 pills left, denies other symptoms  Does the patient have any new or worsening symptoms? ---Yes Will a triage be completed? ---Yes Related visit to physician within the last 2 weeks? ---No Does the PT have any chronic conditions? (i.e. diabetes, asthma, this includes High risk factors for pregnancy, etc.) ---No Is this a behavioral health or substance abuse call? ---No  Please select the assessment type ---Refill Additional Documentation ---Tramadol 50 mg tid Does the patient have enough medication to last until the office opens? ---No  Back Pain  [1] MODERATE back pain (e.g., interferes with normal activities) [2] present > 3 days  10/26/2021 4:07:24 PM SEE PCP WITHIN 3 DAYS Yes Scott, RN, 10/28/2021 Instructed caller no medication can be called in after hours and to go to UC today or tomorrow and she states she has no ride, instructed to call the office on Monday for refill,

## 2021-10-30 DIAGNOSIS — I110 Hypertensive heart disease with heart failure: Secondary | ICD-10-CM | POA: Diagnosis not present

## 2021-10-30 DIAGNOSIS — I5032 Chronic diastolic (congestive) heart failure: Secondary | ICD-10-CM | POA: Diagnosis not present

## 2021-10-30 DIAGNOSIS — I70213 Atherosclerosis of native arteries of extremities with intermittent claudication, bilateral legs: Secondary | ICD-10-CM | POA: Diagnosis not present

## 2021-10-30 DIAGNOSIS — U071 COVID-19: Secondary | ICD-10-CM | POA: Diagnosis not present

## 2021-10-30 DIAGNOSIS — J449 Chronic obstructive pulmonary disease, unspecified: Secondary | ICD-10-CM | POA: Diagnosis not present

## 2021-10-30 DIAGNOSIS — M48061 Spinal stenosis, lumbar region without neurogenic claudication: Secondary | ICD-10-CM | POA: Diagnosis not present

## 2021-10-31 DIAGNOSIS — I110 Hypertensive heart disease with heart failure: Secondary | ICD-10-CM | POA: Diagnosis not present

## 2021-10-31 DIAGNOSIS — U071 COVID-19: Secondary | ICD-10-CM | POA: Diagnosis not present

## 2021-10-31 DIAGNOSIS — I5032 Chronic diastolic (congestive) heart failure: Secondary | ICD-10-CM | POA: Diagnosis not present

## 2021-10-31 DIAGNOSIS — M48061 Spinal stenosis, lumbar region without neurogenic claudication: Secondary | ICD-10-CM | POA: Diagnosis not present

## 2021-10-31 DIAGNOSIS — J449 Chronic obstructive pulmonary disease, unspecified: Secondary | ICD-10-CM | POA: Diagnosis not present

## 2021-10-31 DIAGNOSIS — I70213 Atherosclerosis of native arteries of extremities with intermittent claudication, bilateral legs: Secondary | ICD-10-CM | POA: Diagnosis not present

## 2021-11-01 DIAGNOSIS — I5032 Chronic diastolic (congestive) heart failure: Secondary | ICD-10-CM | POA: Diagnosis not present

## 2021-11-01 DIAGNOSIS — M48061 Spinal stenosis, lumbar region without neurogenic claudication: Secondary | ICD-10-CM | POA: Diagnosis not present

## 2021-11-01 DIAGNOSIS — I70213 Atherosclerosis of native arteries of extremities with intermittent claudication, bilateral legs: Secondary | ICD-10-CM | POA: Diagnosis not present

## 2021-11-01 DIAGNOSIS — I110 Hypertensive heart disease with heart failure: Secondary | ICD-10-CM | POA: Diagnosis not present

## 2021-11-01 DIAGNOSIS — J449 Chronic obstructive pulmonary disease, unspecified: Secondary | ICD-10-CM | POA: Diagnosis not present

## 2021-11-01 DIAGNOSIS — U071 COVID-19: Secondary | ICD-10-CM | POA: Diagnosis not present

## 2021-11-02 DIAGNOSIS — I70213 Atherosclerosis of native arteries of extremities with intermittent claudication, bilateral legs: Secondary | ICD-10-CM | POA: Diagnosis not present

## 2021-11-02 DIAGNOSIS — M48061 Spinal stenosis, lumbar region without neurogenic claudication: Secondary | ICD-10-CM | POA: Diagnosis not present

## 2021-11-02 DIAGNOSIS — J449 Chronic obstructive pulmonary disease, unspecified: Secondary | ICD-10-CM | POA: Diagnosis not present

## 2021-11-02 DIAGNOSIS — I5032 Chronic diastolic (congestive) heart failure: Secondary | ICD-10-CM | POA: Diagnosis not present

## 2021-11-02 DIAGNOSIS — U071 COVID-19: Secondary | ICD-10-CM | POA: Diagnosis not present

## 2021-11-02 DIAGNOSIS — I110 Hypertensive heart disease with heart failure: Secondary | ICD-10-CM | POA: Diagnosis not present

## 2021-11-05 DIAGNOSIS — I480 Paroxysmal atrial fibrillation: Secondary | ICD-10-CM | POA: Diagnosis not present

## 2021-11-05 DIAGNOSIS — I70213 Atherosclerosis of native arteries of extremities with intermittent claudication, bilateral legs: Secondary | ICD-10-CM | POA: Diagnosis not present

## 2021-11-05 DIAGNOSIS — G2581 Restless legs syndrome: Secondary | ICD-10-CM

## 2021-11-05 DIAGNOSIS — D509 Iron deficiency anemia, unspecified: Secondary | ICD-10-CM | POA: Diagnosis not present

## 2021-11-05 DIAGNOSIS — K219 Gastro-esophageal reflux disease without esophagitis: Secondary | ICD-10-CM | POA: Diagnosis not present

## 2021-11-05 DIAGNOSIS — I700 Atherosclerosis of aorta: Secondary | ICD-10-CM

## 2021-11-05 DIAGNOSIS — I110 Hypertensive heart disease with heart failure: Secondary | ICD-10-CM | POA: Diagnosis not present

## 2021-11-05 DIAGNOSIS — J449 Chronic obstructive pulmonary disease, unspecified: Secondary | ICD-10-CM | POA: Diagnosis not present

## 2021-11-05 DIAGNOSIS — Z87891 Personal history of nicotine dependence: Secondary | ICD-10-CM

## 2021-11-05 DIAGNOSIS — F3341 Major depressive disorder, recurrent, in partial remission: Secondary | ICD-10-CM

## 2021-11-05 DIAGNOSIS — E538 Deficiency of other specified B group vitamins: Secondary | ICD-10-CM | POA: Diagnosis not present

## 2021-11-05 DIAGNOSIS — U071 COVID-19: Secondary | ICD-10-CM | POA: Diagnosis not present

## 2021-11-05 DIAGNOSIS — Z7901 Long term (current) use of anticoagulants: Secondary | ICD-10-CM

## 2021-11-05 DIAGNOSIS — I5032 Chronic diastolic (congestive) heart failure: Secondary | ICD-10-CM | POA: Diagnosis not present

## 2021-11-05 DIAGNOSIS — Z9181 History of falling: Secondary | ICD-10-CM

## 2021-11-05 DIAGNOSIS — M810 Age-related osteoporosis without current pathological fracture: Secondary | ICD-10-CM | POA: Diagnosis not present

## 2021-11-05 DIAGNOSIS — R001 Bradycardia, unspecified: Secondary | ICD-10-CM

## 2021-11-05 DIAGNOSIS — M48061 Spinal stenosis, lumbar region without neurogenic claudication: Secondary | ICD-10-CM | POA: Diagnosis not present

## 2021-11-05 DIAGNOSIS — E785 Hyperlipidemia, unspecified: Secondary | ICD-10-CM | POA: Diagnosis not present

## 2021-11-06 DIAGNOSIS — M810 Age-related osteoporosis without current pathological fracture: Secondary | ICD-10-CM | POA: Diagnosis not present

## 2021-11-06 DIAGNOSIS — U071 COVID-19: Secondary | ICD-10-CM | POA: Diagnosis not present

## 2021-11-06 DIAGNOSIS — K219 Gastro-esophageal reflux disease without esophagitis: Secondary | ICD-10-CM | POA: Diagnosis not present

## 2021-11-06 DIAGNOSIS — J449 Chronic obstructive pulmonary disease, unspecified: Secondary | ICD-10-CM | POA: Diagnosis not present

## 2021-11-06 DIAGNOSIS — I5032 Chronic diastolic (congestive) heart failure: Secondary | ICD-10-CM | POA: Diagnosis not present

## 2021-11-06 DIAGNOSIS — E538 Deficiency of other specified B group vitamins: Secondary | ICD-10-CM | POA: Diagnosis not present

## 2021-11-06 DIAGNOSIS — I110 Hypertensive heart disease with heart failure: Secondary | ICD-10-CM | POA: Diagnosis not present

## 2021-11-06 DIAGNOSIS — R001 Bradycardia, unspecified: Secondary | ICD-10-CM | POA: Diagnosis not present

## 2021-11-06 DIAGNOSIS — I700 Atherosclerosis of aorta: Secondary | ICD-10-CM | POA: Diagnosis not present

## 2021-11-06 DIAGNOSIS — Z9181 History of falling: Secondary | ICD-10-CM | POA: Diagnosis not present

## 2021-11-06 DIAGNOSIS — E785 Hyperlipidemia, unspecified: Secondary | ICD-10-CM | POA: Diagnosis not present

## 2021-11-06 DIAGNOSIS — M48061 Spinal stenosis, lumbar region without neurogenic claudication: Secondary | ICD-10-CM | POA: Diagnosis not present

## 2021-11-06 DIAGNOSIS — D509 Iron deficiency anemia, unspecified: Secondary | ICD-10-CM | POA: Diagnosis not present

## 2021-11-06 DIAGNOSIS — Z87891 Personal history of nicotine dependence: Secondary | ICD-10-CM | POA: Diagnosis not present

## 2021-11-06 DIAGNOSIS — I70213 Atherosclerosis of native arteries of extremities with intermittent claudication, bilateral legs: Secondary | ICD-10-CM | POA: Diagnosis not present

## 2021-11-06 DIAGNOSIS — Z7901 Long term (current) use of anticoagulants: Secondary | ICD-10-CM | POA: Diagnosis not present

## 2021-11-06 DIAGNOSIS — G2581 Restless legs syndrome: Secondary | ICD-10-CM | POA: Diagnosis not present

## 2021-11-06 DIAGNOSIS — F3341 Major depressive disorder, recurrent, in partial remission: Secondary | ICD-10-CM | POA: Diagnosis not present

## 2021-11-06 DIAGNOSIS — I480 Paroxysmal atrial fibrillation: Secondary | ICD-10-CM | POA: Diagnosis not present

## 2021-11-07 ENCOUNTER — Other Ambulatory Visit: Payer: Self-pay

## 2021-11-07 ENCOUNTER — Other Ambulatory Visit: Payer: Medicare Other | Admitting: Hospice

## 2021-11-07 DIAGNOSIS — I5032 Chronic diastolic (congestive) heart failure: Secondary | ICD-10-CM | POA: Diagnosis not present

## 2021-11-07 DIAGNOSIS — M545 Low back pain, unspecified: Secondary | ICD-10-CM | POA: Diagnosis not present

## 2021-11-07 DIAGNOSIS — Z515 Encounter for palliative care: Secondary | ICD-10-CM | POA: Diagnosis not present

## 2021-11-07 DIAGNOSIS — R531 Weakness: Secondary | ICD-10-CM

## 2021-11-07 DIAGNOSIS — G8929 Other chronic pain: Secondary | ICD-10-CM | POA: Diagnosis not present

## 2021-11-07 NOTE — Progress Notes (Signed)
[facility] [facility] Palliative Care Consult Note Telephone: [phone]  Fax: [phone]  PATIENT NAME: Cheryl Atkinson DOB: 03/28/1931 MRN: [id number]  PRIMARY CARE PROVIDER:   Marin Olp, MD Marin Olp, [address] [place],  [place] [address]  REFERRING PROVIDER: Marin Olp, MD Marin Olp, [place],  [place] [address]  RESPONSIBLE PARTY:   Contact Information     Name Relation Home Work [place] Sister   [phone]   Jeanee, Fresh [phone]  [phone]   Verta Ellen [phone]  [phone]   Petty,Deborah Other [phone]         Visit is to build trust and highlight Palliative Medicine as specialized medical care for people living with serious illness, aimed at facilitating better quality of life through symptoms relief, assisting with advance care planning and complex medical decision making. Damaris Hippo NP shadowing. This is a follow up visit.  RECOMMENDATIONS/PLAN:   Advance Care Planning/Code Status: Patient is a Do Not Resusictate  Goals of Care: Goals of care include to maximize quality of life and symptom management.  Visit consisted of counseling and education dealing with the complex and emotionally intense issues of symptom management and palliative care in the setting of serious and potentially life-threatening illness. Palliative care team will continue to support patient, patient's family, and medical team.  Symptom management/Plan:  Weakness: Improving; PT/OT for strengthening is ongoing. Appetite is good and patient getting adequate oral intake.  CHF: Continue Lasix as ordered.  Reduced salt. Constipation: Managed with Miralax Low back pain: Managed with Tramadol prn. She reports she takes Tylenol Arthritis sometimes instead of Tramadol.  Follow up: Palliative care will continue to follow for complex medical decision making, advance  care planning, and clarification of goals. Return 6 weeks or prn.Encouraged to call provider sooner with any concerns.    Family /Caregiver/Community Supports: Patient in SNF for ongoing care.   HOSPICE ELIGIBILITY/DIAGNOSIS: TBD   Chief Complaint: Follow up visit   HISTORY OF PRESENT ILLNESS:  Cheryl Atkinson is a 87 y.o. year old female  with multiple medical conditions including acute on chronic weakness worsened with recent hospitalization 9/28 - 09/08/2021 for COVID-19 virus infection.  Weakness has improved; she is now back home and ambulatory within her home with her rolling walker. She is continuing with PT/OT.  History of COPD, diastolic congestive heart failure, Parkinson disease, insomnia, hypertension.  Patient denies pain/discomfort, in no respiratory distress. History obtained from review of EMR, discussion with primary team, family and/or patient. Records reviewed and summarized above. All 10 point systems reviewed and are negative except as documented in history of present illness above  Review and summarization of Epic records shows history from other than patient.   Palliative Care was asked to follow this patient o help address complex decision making in the context of advance care planning and goals of care clarification.  PHYSICAL EXAM  General: In no acute distress, appropriately dressed Cardiovascular: regular rate and rhythm; no edema in BLE Pulmonary: no cough, no increased work of breathing, normal respiratory effort Abdomen: soft, non tender, no guarding, positive bowel sounds in all quadrants GU:  no suprapubic tenderness Eyes: Normal lids, no discharge ENMT: Moist mucous membranes Musculoskeletal:  weakness, ambulatory with rolling walker Skin: no rash to visible skin, warm without cyanosis,  Psych: non-anxious affect Neurological: Weakness but otherwise non focal Heme/lymph/immuno: no bruises, no bleeding  PERTINENT MEDICATIONS:  Outpatient Encounter  Medications as of 11/07/2021  Medication Sig   Albuterol Sulfate (PROAIR RESPICLICK) 108 (90 Base) MCG/ACT AEPB Inhale 2 puffs into the lungs every 6 (six) hours as needed (shortness of breath from COPD).   apixaban (ELIQUIS) 2.5 MG TABS tablet Take 1 tablet (2.5 mg total) by mouth 2 (two) times daily.   CALCIUM PO Take by mouth.   carbidopa-levodopa (SINEMET IR) 25-100 MG tablet Take daily as needed for worsening RLS.   Cholecalciferol (VITAMIN D3 PO) Take by mouth.   ferrous sulfate 325 (65 FE) MG tablet Take 325 mg by mouth 2 (two) times a week. Patient takes one tablet on Monday and Friday   furosemide (LASIX) 40 MG tablet Take 0.5 tablets (20 mg total) by mouth daily.   irbesartan (AVAPRO) 300 MG tablet Take 1 tablet (300 mg total) by mouth daily.   LINZESS 145 MCG CAPS capsule Take 145 mcg by mouth daily as needed (constipation).   Multiple Vitamins-Minerals (PRESERVISION AREDS 2 PO) Take 1 tablet by mouth 2 (two) times daily.   polyethylene glycol (MIRALAX / GLYCOLAX) 17 g packet Take 17 g by mouth 2 (two) times daily. Reported on 03/14/2016 (Patient taking differently: Take 17 g by mouth daily as needed for mild constipation.)   potassium chloride SA (KLOR-CON) 10 MEQ tablet Take 1 tablet (10 mEq total) by mouth 2 (two) times daily as needed. Needs to be taken  With each dose of lasix   rosuvastatin (CRESTOR) 40 MG tablet Take 1 tablet (40 mg total) by mouth daily. Take 1 tablet by mouth daily.   Rotigotine (NEUPRO) 3 MG/24HR PT24 PLACE 1 PATCH (3 MG) ONTO THE SKIN AT BEDTIME (Patient taking differently: Place 3 mg onto the skin at bedtime.)   traMADol (ULTRAM) 50 MG tablet Take 1 tablet (50 mg total) by mouth every 6 (six) hours as needed.   traZODone (DESYREL) 50 MG tablet Take 1.5 tablets (75 mg total) by mouth at bedtime as needed for sleep.   vitamin B-12 (CYANOCOBALAMIN) 1000 MCG tablet Take 1,000 mcg by mouth daily.   No facility-administered encounter medications on file as of  11/07/2021.    HOSPICE ELIGIBILITY/DIAGNOSIS: TBD  PAST MEDICAL HISTORY:  Past Medical History:  Diagnosis Date   Acute cholecystitis 12/29/2019   Anemia    Arthritis    "shoulders" (05/04/2015)   Cellulitis of right lower extremity 11/27/2013   CHF (congestive heart failure) (HCC)    Chronic lower back pain    Constipation    COPD (chronic obstructive pulmonary disease) (HCC)    GERD (gastroesophageal reflux disease)    History of hiatal hernia    HLD (hyperlipidemia)    Hypertension    Insomnia    Osteoporosis    Peripheral arterial disease (HCC)    Restless leg syndrome    Thyroid disease       ALLERGIES:  Allergies  Allergen Reactions   Codeine     hallucinations      I spent 40 minutes providing this consultation; this includes time spent with patient/family, chart review and documentation. More than 50% of the time in this consultation was spent on counseling and coordinating communication   Thank you for the opportunity to participate in the care of GEORGETTA CRAFTON Please call our office at [phone] if we can be of additional assistance.  Note: Portions of this note were generated with Scientist, clinical (histocompatibility and immunogenetics). Dictation errors may occur despite best attempts at proofreading.  Rosaura Carpenter, NP

## 2021-11-08 ENCOUNTER — Encounter (INDEPENDENT_AMBULATORY_CARE_PROVIDER_SITE_OTHER): Payer: Medicare Other | Admitting: Ophthalmology

## 2021-11-08 ENCOUNTER — Other Ambulatory Visit: Payer: Self-pay

## 2021-11-08 DIAGNOSIS — H43813 Vitreous degeneration, bilateral: Secondary | ICD-10-CM

## 2021-11-08 DIAGNOSIS — I10 Essential (primary) hypertension: Secondary | ICD-10-CM | POA: Diagnosis not present

## 2021-11-08 DIAGNOSIS — H353221 Exudative age-related macular degeneration, left eye, with active choroidal neovascularization: Secondary | ICD-10-CM | POA: Diagnosis not present

## 2021-11-08 DIAGNOSIS — H35033 Hypertensive retinopathy, bilateral: Secondary | ICD-10-CM

## 2021-11-08 DIAGNOSIS — H353112 Nonexudative age-related macular degeneration, right eye, intermediate dry stage: Secondary | ICD-10-CM | POA: Diagnosis not present

## 2021-11-09 DIAGNOSIS — I70213 Atherosclerosis of native arteries of extremities with intermittent claudication, bilateral legs: Secondary | ICD-10-CM | POA: Diagnosis not present

## 2021-11-09 DIAGNOSIS — J449 Chronic obstructive pulmonary disease, unspecified: Secondary | ICD-10-CM | POA: Diagnosis not present

## 2021-11-09 DIAGNOSIS — I5032 Chronic diastolic (congestive) heart failure: Secondary | ICD-10-CM | POA: Diagnosis not present

## 2021-11-09 DIAGNOSIS — U071 COVID-19: Secondary | ICD-10-CM | POA: Diagnosis not present

## 2021-11-09 DIAGNOSIS — M48061 Spinal stenosis, lumbar region without neurogenic claudication: Secondary | ICD-10-CM | POA: Diagnosis not present

## 2021-11-09 DIAGNOSIS — I110 Hypertensive heart disease with heart failure: Secondary | ICD-10-CM | POA: Diagnosis not present

## 2021-11-12 ENCOUNTER — Telehealth: Payer: Self-pay

## 2021-11-12 ENCOUNTER — Telehealth: Payer: Self-pay | Admitting: Neurology

## 2021-11-12 DIAGNOSIS — M48061 Spinal stenosis, lumbar region without neurogenic claudication: Secondary | ICD-10-CM | POA: Diagnosis not present

## 2021-11-12 DIAGNOSIS — I5032 Chronic diastolic (congestive) heart failure: Secondary | ICD-10-CM | POA: Diagnosis not present

## 2021-11-12 DIAGNOSIS — I110 Hypertensive heart disease with heart failure: Secondary | ICD-10-CM | POA: Diagnosis not present

## 2021-11-12 DIAGNOSIS — J449 Chronic obstructive pulmonary disease, unspecified: Secondary | ICD-10-CM | POA: Diagnosis not present

## 2021-11-12 DIAGNOSIS — I70213 Atherosclerosis of native arteries of extremities with intermittent claudication, bilateral legs: Secondary | ICD-10-CM | POA: Diagnosis not present

## 2021-11-12 DIAGNOSIS — U071 COVID-19: Secondary | ICD-10-CM | POA: Diagnosis not present

## 2021-11-12 MED ORDER — CARBIDOPA-LEVODOPA 25-100 MG PO TABS: ORAL_TABLET | ORAL | 1 refills | Status: DC

## 2021-11-12 NOTE — Telephone Encounter (Signed)
Please schedule visit for pt to be evaluated.

## 2021-11-12 NOTE — Telephone Encounter (Signed)
Dionka from [facility] called stating that Cheryl Atkinson fell backwards on Saturday. She stated that she hit the back of her head and now has a knot. She can be reached at [phone] with any questions. Please Advise.

## 2021-11-12 NOTE — Telephone Encounter (Signed)
Refill has been sent to [facility]. Called patient to inform but call was picked up from someone and hung up.

## 2021-11-12 NOTE — Telephone Encounter (Signed)
Pt is scheduled with Dr Jimmey Ralph

## 2021-11-12 NOTE — Telephone Encounter (Signed)
1. Which medications need refilled? (List name and dosage, if known) Carbidopa - levodopa   2. Which pharmacy/location is medication to be sent to? (include street and city if local pharmacy) [facility]

## 2021-11-13 ENCOUNTER — Other Ambulatory Visit: Payer: Self-pay | Admitting: Family Medicine

## 2021-11-13 ENCOUNTER — Ambulatory Visit (INDEPENDENT_AMBULATORY_CARE_PROVIDER_SITE_OTHER): Payer: Medicare Other | Admitting: *Deleted

## 2021-11-13 DIAGNOSIS — I5032 Chronic diastolic (congestive) heart failure: Secondary | ICD-10-CM

## 2021-11-13 DIAGNOSIS — W19XXXA Unspecified fall, initial encounter: Secondary | ICD-10-CM

## 2021-11-13 DIAGNOSIS — M545 Low back pain, unspecified: Secondary | ICD-10-CM

## 2021-11-13 NOTE — Patient Instructions (Addendum)
Visit Information  Thank you for taking time to visit with me today. Please don't hesitate to contact me if I can be of assistance to you before our next scheduled telephone appointment.  Following are the goals we discussed today:  Take medications as prescribed   Attend all scheduled provider appointments Perform all self care activities independently  call office if I gain more than 2 pounds in one day or 5 pounds in one week weigh myself daily bring diary to all appointments develop a rescue plan follow rescue plan if symptoms flare-up know when to call the doctor based on weight and symptoms track symptoms and what helps feel better or worse Obtain no skid socks  Our next appointment is by telephone on 12/04/21 at 1130  Please call the care guide team at [phone] if you need to cancel or reschedule your appointment.   If you are experiencing a Mental Health or Behavioral Health Crisis or need someone to talk to, please call the Suicide and Crisis Lifeline: 988 call the [place] National Suicide Prevention Lifeline: (305) 369-5893 or TTY: 618-123-0948 TTY (343)060-5592) to talk to a trained counselor call 1-800-273-TALK (toll free, 24 hour hotline) go to [facility] [address], [place] [phone]) call 911   The patient verbalized understanding of instructions, educational materials, and care plan provided today and declined offer to receive copy of patient instructions, educational materials, and care plan.   Rhae Lerner RN, MSN RN Care Management Coordinator  [facility] [phone] [email or web address]

## 2021-11-13 NOTE — Chronic Care Management (AMB) (Signed)
Chronic Care Management   CCM RN Visit Note  11/13/2021 Name: Cheryl Atkinson MRN: [id number] DOB: 01-Dec-1931  Subjective: Cheryl Atkinson is a 87 y.o. year old female who is a primary care patient of Cheryl Atkinson, Cheryl Contes, MD. The care management team was consulted for assistance with disease management and care coordination needs.    Engaged with patient by telephone for follow up visit in response to provider referral for case management and/or care coordination services.   Consent to Services:  The patient was given information about Chronic Care Management services, agreed to services, and gave verbal consent prior to initiation of services.  Please see initial visit note for detailed documentation.   Patient agreed to services and verbal consent obtained.   Assessment: Review of patient past medical history, allergies, medications, health status, including review of consultants reports, laboratory and other test data, was performed as part of comprehensive evaluation and provision of chronic care management services.   SDOH (Social Determinants of Health) assessments and interventions performed:    CCM Care Plan  Allergies  Allergen Reactions   Codeine     hallucinations    Outpatient Encounter Medications as of 11/13/2021  Medication Sig Note   Albuterol Sulfate (PROAIR RESPICLICK) 108 (90 Base) MCG/ACT AEPB Inhale 2 puffs into the lungs every 6 (six) hours as needed (shortness of breath from COPD).    apixaban (ELIQUIS) 2.5 MG TABS tablet Take 1 tablet (2.5 mg total) by mouth 2 (two) times daily.    CALCIUM PO Take by mouth.    carbidopa-levodopa (SINEMET IR) 25-100 MG tablet Take daily as needed for worsening RLS.    Cholecalciferol (VITAMIN D3 PO) Take by mouth.    ferrous sulfate 325 (65 FE) MG tablet Take 325 mg by mouth 2 (two) times a week. Patient takes one tablet on Monday and Friday    furosemide (LASIX) 40 MG tablet TAKE 1 TABLET TWICE A DAY    irbesartan (AVAPRO)  300 MG tablet Take 1 tablet (300 mg total) by mouth daily. 08/30/2021: Last filled 7.2.22 for 90 days supply   LINZESS 145 MCG CAPS capsule Take 145 mcg by mouth daily as needed (constipation).    Multiple Vitamins-Minerals (PRESERVISION AREDS 2 PO) Take 1 tablet by mouth 2 (two) times daily.    polyethylene glycol (MIRALAX / GLYCOLAX) 17 g packet Take 17 g by mouth 2 (two) times daily. Reported on 03/14/2016 (Patient taking differently: Take 17 g by mouth daily as needed for mild constipation.)    potassium chloride SA (KLOR-CON) 10 MEQ tablet Take 1 tablet (10 mEq total) by mouth 2 (two) times daily as needed. Needs to be taken  With each dose of lasix    rosuvastatin (CRESTOR) 40 MG tablet TAKE 1 TABLET DAILY    Rotigotine (NEUPRO) 3 MG/24HR PT24 PLACE 1 PATCH (3 MG) ONTO THE SKIN AT BEDTIME (Patient taking differently: Place 3 mg onto the skin at bedtime.) 08/30/2021: Last filled 7.2.22 for 90 days supply   traMADol (ULTRAM) 50 MG tablet Take 1 tablet (50 mg total) by mouth every 6 (six) hours as needed.    traZODone (DESYREL) 50 MG tablet Take 1.5 tablets (75 mg total) by mouth at bedtime as needed for sleep. 08/30/2021: Last filled 8.20.22 for 90 days supply   vitamin B-12 (CYANOCOBALAMIN) 1000 MCG tablet Take 1,000 mcg by mouth daily.    No facility-administered encounter medications on file as of 11/13/2021.    Patient Active Problem List   Diagnosis  Date Noted   COVID-19 08/30/2021   COVID-19 virus infection 08/29/2021   Renal lesion 08/29/2021   B12 deficiency 02/27/2021   Pleural effusion due to CHF (congestive heart failure) ([place]) 05/26/2020   S/P laparoscopic cholecystectomy 04/04/2020   Heart failure with preserved ejection fraction ([place]) 03/01/2020   Insomnia 03/01/2020   Bacteremia 01/28/2020   Atrial fibrillation ([place]) 01/25/2020   Abdominal aortic ectasia ([place]) 12/29/2019   Major depression 03/25/2018   Hypokalemia 12/21/2016   Cat bite of right hand 12/21/2016   BPPV  (benign paroxysmal positional vertigo) 07/12/2016   Memory loss 04/11/2016   Mallet toe of right foot 10/20/2015   Renal artery stenosis ([place]) 09/27/2015   Hyperlipidemia 06/30/2015   Claudication ([place]) 05/04/2015   Atherosclerotic PVD with intermittent claudication ([place]) 04/26/2015   Tinnitus 12/31/2014   Former smoker 09/29/2014   DNR (do not resuscitate) 09/29/2014   Hyperglycemia 07/19/2014   Multinodular goiter 08/30/2013   Spinal stenosis of lumbar region at multiple levels 09/29/2012   HIP PAIN, BILATERAL 07/16/2010   COPD (chronic obstructive pulmonary disease) ([place]) 09/20/2009   CONSTIPATION, CHRONIC 09/20/2009   Iron deficiency anemia 11/09/2007   History of UTI 11/09/2007   RESTLESS LEG SYNDROME 09/25/2007   Essential hypertension 09/25/2007   GERD 09/25/2007   LOW BACK PAIN 09/25/2007   Osteoporosis 09/25/2007    Conditions to be addressed/monitored:CHF and FALLS  Care Plan : [place] (Adult)  Updates made by Cheryl Singleton, RN since 11/13/2021 12:00 AM     Problem: Inability to care for self based on knowledge deficit   Priority: High     Long-Range Goal: Patient will work with CCM team to gain knowledge of self care management of chronic conditions   Start Date: 10/23/2021  Expected End Date: 10/23/2022  This Visit's Progress: Not on track  Priority: High  Note:   Current Barriers:  Knowledge Deficits related to plan of care for management of CHF and Falls  Chronic Disease Management support and education needs related to CHF and Falls Literacy barriers    Patient with recent discharge to home from Rehab facility due to fracture of shoulder and finger.  Back home with family caring for her.  Receiving home health PT.  Weight remains steady at 130 pounds.  Does report fall Monday night trying to get in bed.  No injuries  11/13/21--Spoke with patient, reports another fall this past weekend hitting back if head.  Has a small hematoma to  back of head.  Has appointment at [facility] office on 11/16/21.  Denies pain to back of head.  States she had walker during fall, just somehow fell backwards.  Has not been weighing daily and reporting decrease in appetite.  Tries to drink a boost about once a week (limited due to irritation of stomach)  RNCM Clinical Goal(s):    Patient will verbalize understanding of plan for management of CHF and Falls as evidenced by daily weight monitoring verbalize basic understanding of CHF and falls disease process and self health management plan as evidenced by monitoring weights daily and no hospital readmissions take all medications exactly as prescribed and will call provider for medication related questions as evidenced by medication compliance    demonstrate improved health management independence as evidenced by no hospital admissions or falls in the next 90days        through collaboration with RN Care manager, provider, and care team.   Interventions: 1:1 collaboration with primary care provider regarding  development and update of comprehensive plan of care as evidenced by provider attestation and co-signature Inter-disciplinary care team collaboration (see longitudinal plan of care) Evaluation of current treatment plan related to  self management and patient's adherence to plan as established by provider   Heart Failure Interventions:  (Status: Goal on track: NO.)  Long Term Goal  Basic overview and discussion of pathophysiology of Heart Failure reviewed Provided education on low sodium diet Reviewed Heart Failure Action Plan in depth and provided written copy Advised patient to weigh each morning after emptying bladder Discussed importance of daily weight and advised patient to weigh and record daily Reviewed role of diuretics in prevention of fluid overload and management of heart failure Discussed the importance of keeping all appointments with provider Provided patient with education about  the role of exercise in the management of heart failure Discussed purpose and reasoning of weighing daily and encouraged patient to do so Discussed when to call provider based on weights  Falls:  (Status: Goal on track: NO.) Long Term Goal  Provided written and verbal education re: potential causes of falls and Fall prevention strategies Reviewed medications and discussed potential side effects of medications such as dizziness and frequent urination Advised patient of importance of notifying provider of falls Assessed for signs and symptoms of orthostatic hypotension Assessed for falls since last encounter Assessed patients knowledge of fall risk prevention secondary to previously provided education Fall precautions and preventions reviewed and discussed Instructed to use walker with all ambulation Encouraged to attend appointment at provider office this week  Patient Goals/Self-Care Activities: Take medications as prescribed   Attend all scheduled provider appointments Perform all self care activities independently  call office if I gain more than 2 pounds in one day or 5 pounds in one week weigh myself daily bring diary to all appointments develop a rescue plan follow rescue plan if symptoms flare-up know when to call the doctor based on weight and symptoms track symptoms and what helps feel better or worse Obtain no skid socks       Plan:The care management team will reach out to the patient again over the next 30 days.  Hubert Azure RN, MSN RN Care Management Coordinator  [facility] [phone] [email or web address]

## 2021-11-14 DIAGNOSIS — U071 COVID-19: Secondary | ICD-10-CM | POA: Diagnosis not present

## 2021-11-14 DIAGNOSIS — I5032 Chronic diastolic (congestive) heart failure: Secondary | ICD-10-CM | POA: Diagnosis not present

## 2021-11-14 DIAGNOSIS — M48061 Spinal stenosis, lumbar region without neurogenic claudication: Secondary | ICD-10-CM | POA: Diagnosis not present

## 2021-11-14 DIAGNOSIS — J449 Chronic obstructive pulmonary disease, unspecified: Secondary | ICD-10-CM | POA: Diagnosis not present

## 2021-11-14 DIAGNOSIS — I110 Hypertensive heart disease with heart failure: Secondary | ICD-10-CM | POA: Diagnosis not present

## 2021-11-14 DIAGNOSIS — I70213 Atherosclerosis of native arteries of extremities with intermittent claudication, bilateral legs: Secondary | ICD-10-CM | POA: Diagnosis not present

## 2021-11-15 ENCOUNTER — Telehealth: Payer: Self-pay

## 2021-11-15 NOTE — Telephone Encounter (Signed)
At moment my 1 40 on Friday is open if this could be addressed ASAP

## 2021-11-15 NOTE — Telephone Encounter (Signed)
It looks like she is seeing DR. Parker tomorrow at 2 pm- could she see me at 1 40 instead !? Then I could sign face to face and you can submit these orders

## 2021-11-15 NOTE — Telephone Encounter (Signed)
Ok for orders? 

## 2021-11-15 NOTE — Telephone Encounter (Signed)
Home Health Verbal Orders  Agency:  [facility]   Contact and title  Requesting OT/ PT/ Skilled nursing/ Social Work/ Speech:   Extending OT   Frequency:  1 Week 3 with re certification   HH needs F2F w/in last 30 days

## 2021-11-15 NOTE — Telephone Encounter (Signed)
Patient is scheduled   

## 2021-11-16 ENCOUNTER — Ambulatory Visit: Payer: Medicare Other | Admitting: Family Medicine

## 2021-11-16 DIAGNOSIS — I70213 Atherosclerosis of native arteries of extremities with intermittent claudication, bilateral legs: Secondary | ICD-10-CM | POA: Diagnosis not present

## 2021-11-16 DIAGNOSIS — U071 COVID-19: Secondary | ICD-10-CM | POA: Diagnosis not present

## 2021-11-16 DIAGNOSIS — I110 Hypertensive heart disease with heart failure: Secondary | ICD-10-CM | POA: Diagnosis not present

## 2021-11-16 DIAGNOSIS — J449 Chronic obstructive pulmonary disease, unspecified: Secondary | ICD-10-CM | POA: Diagnosis not present

## 2021-11-16 DIAGNOSIS — M48061 Spinal stenosis, lumbar region without neurogenic claudication: Secondary | ICD-10-CM | POA: Diagnosis not present

## 2021-11-16 DIAGNOSIS — I5032 Chronic diastolic (congestive) heart failure: Secondary | ICD-10-CM | POA: Diagnosis not present

## 2021-11-16 NOTE — Telephone Encounter (Signed)
Received a call from [facility], asked to add on an extra visit today as Cheryl Atkinson is needing to go by the house to set things up for Cheryl Atkinson.

## 2021-11-19 DIAGNOSIS — J449 Chronic obstructive pulmonary disease, unspecified: Secondary | ICD-10-CM | POA: Diagnosis not present

## 2021-11-19 DIAGNOSIS — I110 Hypertensive heart disease with heart failure: Secondary | ICD-10-CM | POA: Diagnosis not present

## 2021-11-19 DIAGNOSIS — M48061 Spinal stenosis, lumbar region without neurogenic claudication: Secondary | ICD-10-CM | POA: Diagnosis not present

## 2021-11-19 DIAGNOSIS — I70213 Atherosclerosis of native arteries of extremities with intermittent claudication, bilateral legs: Secondary | ICD-10-CM | POA: Diagnosis not present

## 2021-11-19 DIAGNOSIS — U071 COVID-19: Secondary | ICD-10-CM | POA: Diagnosis not present

## 2021-11-19 DIAGNOSIS — I5032 Chronic diastolic (congestive) heart failure: Secondary | ICD-10-CM | POA: Diagnosis not present

## 2021-11-22 DIAGNOSIS — M48061 Spinal stenosis, lumbar region without neurogenic claudication: Secondary | ICD-10-CM | POA: Diagnosis not present

## 2021-11-22 DIAGNOSIS — U071 COVID-19: Secondary | ICD-10-CM | POA: Diagnosis not present

## 2021-11-22 DIAGNOSIS — I5032 Chronic diastolic (congestive) heart failure: Secondary | ICD-10-CM | POA: Diagnosis not present

## 2021-11-22 DIAGNOSIS — J449 Chronic obstructive pulmonary disease, unspecified: Secondary | ICD-10-CM | POA: Diagnosis not present

## 2021-11-22 DIAGNOSIS — I70213 Atherosclerosis of native arteries of extremities with intermittent claudication, bilateral legs: Secondary | ICD-10-CM | POA: Diagnosis not present

## 2021-11-22 DIAGNOSIS — I110 Hypertensive heart disease with heart failure: Secondary | ICD-10-CM | POA: Diagnosis not present

## 2021-11-28 DIAGNOSIS — I70213 Atherosclerosis of native arteries of extremities with intermittent claudication, bilateral legs: Secondary | ICD-10-CM | POA: Diagnosis not present

## 2021-11-28 DIAGNOSIS — M48061 Spinal stenosis, lumbar region without neurogenic claudication: Secondary | ICD-10-CM | POA: Diagnosis not present

## 2021-11-28 DIAGNOSIS — J449 Chronic obstructive pulmonary disease, unspecified: Secondary | ICD-10-CM | POA: Diagnosis not present

## 2021-11-28 DIAGNOSIS — U071 COVID-19: Secondary | ICD-10-CM | POA: Diagnosis not present

## 2021-11-28 DIAGNOSIS — I110 Hypertensive heart disease with heart failure: Secondary | ICD-10-CM | POA: Diagnosis not present

## 2021-11-28 DIAGNOSIS — I5032 Chronic diastolic (congestive) heart failure: Secondary | ICD-10-CM | POA: Diagnosis not present

## 2021-11-30 DIAGNOSIS — I5032 Chronic diastolic (congestive) heart failure: Secondary | ICD-10-CM | POA: Diagnosis not present

## 2021-11-30 DIAGNOSIS — U071 COVID-19: Secondary | ICD-10-CM | POA: Diagnosis not present

## 2021-11-30 DIAGNOSIS — M48061 Spinal stenosis, lumbar region without neurogenic claudication: Secondary | ICD-10-CM | POA: Diagnosis not present

## 2021-11-30 DIAGNOSIS — I70213 Atherosclerosis of native arteries of extremities with intermittent claudication, bilateral legs: Secondary | ICD-10-CM | POA: Diagnosis not present

## 2021-11-30 DIAGNOSIS — J449 Chronic obstructive pulmonary disease, unspecified: Secondary | ICD-10-CM | POA: Diagnosis not present

## 2021-11-30 DIAGNOSIS — I110 Hypertensive heart disease with heart failure: Secondary | ICD-10-CM | POA: Diagnosis not present

## 2021-12-04 ENCOUNTER — Ambulatory Visit (INDEPENDENT_AMBULATORY_CARE_PROVIDER_SITE_OTHER): Payer: Medicare Other | Admitting: *Deleted

## 2021-12-04 DIAGNOSIS — I10 Essential (primary) hypertension: Secondary | ICD-10-CM

## 2021-12-04 DIAGNOSIS — I5032 Chronic diastolic (congestive) heart failure: Secondary | ICD-10-CM

## 2021-12-04 DIAGNOSIS — S42009A Fracture of unspecified part of unspecified clavicle, initial encounter for closed fracture: Secondary | ICD-10-CM

## 2021-12-04 NOTE — Patient Instructions (Signed)
Visit Information  Thank you for taking time to visit with me today. Please don't hesitate to contact me if I can be of assistance to you before our next scheduled telephone appointment.  Following are the goals we discussed today:  Take medications as prescribed   Attend all scheduled provider appointments Perform all self care activities independently  call office if I gain more than 2 pounds in one day or 5 pounds in one week weigh myself daily bring diary to all appointments develop a rescue plan follow rescue plan if symptoms flare-up know when to call the doctor based on weight and symptoms track symptoms and what helps feel better or worse Obtain no skid socks Contact primary care for chest pains  Our next appointment is by telephone on 1/26 at 1030  Please call the care guide team at [phone] if you need to cancel or reschedule your appointment.   If you are experiencing a Mental Health or Behavioral Health Crisis or need someone to talk to, please call the Suicide and Crisis Lifeline: 988 call the [place] National Suicide Prevention Lifeline: 845-488-1836 or TTY: 346-590-2630 TTY 438-462-0588) to talk to a trained counselor call 1-800-273-TALK (toll free, 24 hour hotline) call 911   The patient verbalized understanding of instructions, educational materials, and care plan provided today and declined offer to receive copy of patient instructions, educational materials, and care plan.   Rhae Lerner RN, MSN RN Care Management Coordinator  [facility] [phone] [email or web address]

## 2021-12-04 NOTE — Chronic Care Management (AMB) (Signed)
Chronic Care Management   CCM RN Visit Note  12/04/2021 Name: Cheryl Atkinson MRN: [id number] DOB: [birth year]  Subjective: Cheryl Atkinson is a 88 y.o. year old female who is a primary care patient of Yong Channel, Brayton Mars, MD. The care management team was consulted for assistance with disease management and care coordination needs.    Engaged with patient by telephone for follow up visit in response to provider referral for case management and/or care coordination services.   Consent to Services:  The patient was given information about Chronic Care Management services, agreed to services, and gave verbal consent prior to initiation of services.  Please see initial visit note for detailed documentation.   Patient agreed to services and verbal consent obtained.   Assessment: Review of patient past medical history, allergies, medications, health status, including review of consultants reports, laboratory and other test data, was performed as part of comprehensive evaluation and provision of chronic care management services.   SDOH (Social Determinants of Health) assessments and interventions performed:    CCM Care Plan  Allergies  Allergen Reactions   Codeine     hallucinations    Outpatient Encounter Medications as of 12/04/2021  Medication Sig Note   carbidopa-levodopa (SINEMET IR) 25-100 MG tablet Take daily as needed for worsening RLS.    Albuterol Sulfate (PROAIR RESPICLICK) 123XX123 (90 Base) MCG/ACT AEPB Inhale 2 puffs into the lungs every 6 (six) hours as needed (shortness of breath from COPD).    apixaban (ELIQUIS) 2.5 MG TABS tablet Take 1 tablet (2.5 mg total) by mouth 2 (two) times daily.    CALCIUM PO Take by mouth.    Cholecalciferol (VITAMIN D3 PO) Take by mouth.    ferrous sulfate 325 (65 FE) MG tablet Take 325 mg by mouth 2 (two) times a week. Patient takes one tablet on Monday and Friday    furosemide (LASIX) 40 MG tablet TAKE 1 TABLET TWICE A DAY    irbesartan (AVAPRO) 300  MG tablet Take 1 tablet (300 mg total) by mouth daily. 08/30/2021: Last filled 7.2.22 for 90 days supply   LINZESS 145 MCG CAPS capsule Take 145 mcg by mouth daily as needed (constipation).    Multiple Vitamins-Minerals (PRESERVISION AREDS 2 PO) Take 1 tablet by mouth 2 (two) times daily.    polyethylene glycol (MIRALAX / GLYCOLAX) 17 g packet Take 17 g by mouth 2 (two) times daily. Reported on 03/14/2016 (Patient taking differently: Take 17 g by mouth daily as needed for mild constipation.)    potassium chloride SA (KLOR-CON) 10 MEQ tablet Take 1 tablet (10 mEq total) by mouth 2 (two) times daily as needed. Needs to be taken  With each dose of lasix    rosuvastatin (CRESTOR) 40 MG tablet TAKE 1 TABLET DAILY    Rotigotine (NEUPRO) 3 MG/24HR PT24 PLACE 1 PATCH (3 MG) ONTO THE SKIN AT BEDTIME (Patient taking differently: Place 3 mg onto the skin at bedtime.) 08/30/2021: Last filled 7.2.22 for 90 days supply   traMADol (ULTRAM) 50 MG tablet Take 1 tablet (50 mg total) by mouth every 6 (six) hours as needed.    traZODone (DESYREL) 50 MG tablet Take 1.5 tablets (75 mg total) by mouth at bedtime as needed for sleep. 08/30/2021: Last filled 8.20.22 for 90 days supply   vitamin B-12 (CYANOCOBALAMIN) 1000 MCG tablet Take 1,000 mcg by mouth daily.    No facility-administered encounter medications on file as of 12/04/2021.    Patient Active Problem List   Diagnosis  Date Noted   COVID-19 08/30/2021   COVID-19 virus infection 08/29/2021   Renal lesion 08/29/2021   B12 deficiency 02/27/2021   Pleural effusion due to CHF (congestive heart failure) ([place]) 05/26/2020   S/P laparoscopic cholecystectomy 04/04/2020   Heart failure with preserved ejection fraction ([place]) 03/01/2020   Insomnia 03/01/2020   Bacteremia 01/28/2020   Atrial fibrillation ([place]) 01/25/2020   Abdominal aortic ectasia ([place]) 12/29/2019   Major depression 03/25/2018   Hypokalemia 12/21/2016   Cat bite of right hand 12/21/2016   BPPV (benign  paroxysmal positional vertigo) 07/12/2016   Memory loss 04/11/2016   Mallet toe of right foot 10/20/2015   Renal artery stenosis ([place]) 09/27/2015   Hyperlipidemia 06/30/2015   Claudication ([place]) 05/04/2015   Atherosclerotic PVD with intermittent claudication ([place]) 04/26/2015   Tinnitus 12/31/2014   Former smoker 09/29/2014   DNR (do not resuscitate) 09/29/2014   Hyperglycemia 07/19/2014   Multinodular goiter 08/30/2013   Spinal stenosis of lumbar region at multiple levels 09/29/2012   HIP PAIN, BILATERAL 07/16/2010   COPD (chronic obstructive pulmonary disease) ([place]) 09/20/2009   CONSTIPATION, CHRONIC 09/20/2009   Iron deficiency anemia 11/09/2007   History of UTI 11/09/2007   RESTLESS LEG SYNDROME 09/25/2007   Essential hypertension 09/25/2007   GERD 09/25/2007   LOW BACK PAIN 09/25/2007   Osteoporosis 09/25/2007    Conditions to be addressed/monitored:CHF and falls  Care Plan : [place] (Adult)  Updates made by Leona Singleton, RN since 12/04/2021 12:00 AM     Problem: Inability to care for self based on knowledge deficit   Priority: High     Long-Range Goal: Patient will work with CCM team to gain knowledge of self care management of chronic conditions   Start Date: 10/23/2021  Expected End Date: 10/23/2022  Recent Progress: Not on track  Priority: High  Note:   Current Barriers:  Knowledge Deficits related to plan of care for management of CHF and Falls  Chronic Disease Management support and education needs related to CHF and Falls Literacy barriers    Patient with recent discharge to home from Rehab facility due to fracture of shoulder and finger.  Back home with family caring for her.  Receiving home health PT.  Weight remains steady at 130 pounds.  Does report fall weeks ago trying to get in bed.  No injuries  11/13/21--Spoke with patient, reports another fall this past weekend hitting back if head.  Has a small hematoma to back of head.  Has  appointment at [facility] office on 11/16/21.  Denies pain to back of head.  States she had walker during fall, just somehow fell backwards.  Has not been weighing daily and reporting decrease in appetite.  Tries to drink a boost about once a week (limited due to irritation of stomach)  12/05/21-Spoke with patient, states she isdoing well. Denies any recent falls.  Ambulating with walker.  Has not weighed but feels weight increased from holiday eating.  Denies any lightheadiness or dizzines or shortness of breath.  States she has minimal chest pain with exertion (does not feel like her heart per patient) that resolves with rest.  Encouraged to contact primary care t discuss and if at any point pain does not go awat to go to emergency room.  Continues to work with home health therapy.  RNCM Clinical Goal(s):    Patient will verbalize understanding of plan for management of CHF and Falls as evidenced by daily weight monitoring verbalize basic  understanding of CHF and falls disease process and self health management plan as evidenced by monitoring weights daily and no hospital readmissions take all medications exactly as prescribed and will call provider for medication related questions as evidenced by medication compliance    demonstrate improved health management independence as evidenced by no hospital admissions or falls in the next 90days        through collaboration with RN Care manager, provider, and care team.   Interventions: 1:1 collaboration with primary care provider regarding development and update of comprehensive plan of care as evidenced by provider attestation and co-signature Inter-disciplinary care team collaboration (see longitudinal plan of care) Evaluation of current treatment plan related to  self management and patient's adherence to plan as established by provider   Heart Failure Interventions:  (Status: Goal on track: NO.)  Long Term Goal  Basic overview and discussion of  pathophysiology of Heart Failure reviewed Provided education on low sodium diet Reviewed Heart Failure Action Plan in depth and provided written copy Advised patient to weigh each morning after emptying bladder Discussed importance of daily weight and advised patient to weigh and record daily Reviewed role of diuretics in prevention of fluid overload and management of heart failure Discussed the importance of keeping all appointments with provider Provided patient with education about the role of exercise in the management of heart failure Discussed purpose and reasoning of weighing daily and encouraged patient to do so Discussed when to call provider based on weights  Falls:  (Status: Goal on Track (progressing): YES.) Long Term Goal  Provided written and verbal education re: potential causes of falls and Fall prevention strategies Reviewed medications and discussed potential side effects of medications such as dizziness and frequent urination Advised patient of importance of notifying provider of falls Assessed for signs and symptoms of orthostatic hypotension Assessed for falls since last encounter Assessed patients knowledge of fall risk prevention secondary to previously provided education Fall precautions and preventions reviewed and discussed Instructed to use walker with all ambulation Encouraged to attend scheduled appointments  Patient Goals/Self-Care Activities: Take medications as prescribed   Attend all scheduled provider appointments Perform all self care activities independently  call office if I gain more than 2 pounds in one day or 5 pounds in one week weigh myself daily bring diary to all appointments develop a rescue plan follow rescue plan if symptoms flare-up know when to call the doctor based on weight and symptoms track symptoms and what helps feel better or worse Obtain no skid socks Contact primary care for chest pains      Plan:The care management team  will reach out to the patient again over the next 30 days.  Hubert Azure RN, MSN RN Care Management Coordinator  [facility] [phone] [email or web address]

## 2021-12-05 DIAGNOSIS — M48061 Spinal stenosis, lumbar region without neurogenic claudication: Secondary | ICD-10-CM | POA: Diagnosis not present

## 2021-12-05 DIAGNOSIS — J449 Chronic obstructive pulmonary disease, unspecified: Secondary | ICD-10-CM | POA: Diagnosis not present

## 2021-12-05 DIAGNOSIS — I70213 Atherosclerosis of native arteries of extremities with intermittent claudication, bilateral legs: Secondary | ICD-10-CM | POA: Diagnosis not present

## 2021-12-05 DIAGNOSIS — I5032 Chronic diastolic (congestive) heart failure: Secondary | ICD-10-CM | POA: Diagnosis not present

## 2021-12-05 DIAGNOSIS — I110 Hypertensive heart disease with heart failure: Secondary | ICD-10-CM | POA: Diagnosis not present

## 2021-12-05 DIAGNOSIS — U071 COVID-19: Secondary | ICD-10-CM | POA: Diagnosis not present

## 2021-12-06 ENCOUNTER — Encounter (INDEPENDENT_AMBULATORY_CARE_PROVIDER_SITE_OTHER): Payer: Medicare Other | Admitting: Ophthalmology

## 2021-12-06 ENCOUNTER — Other Ambulatory Visit: Payer: Self-pay

## 2021-12-06 DIAGNOSIS — H353221 Exudative age-related macular degeneration, left eye, with active choroidal neovascularization: Secondary | ICD-10-CM | POA: Diagnosis not present

## 2021-12-06 DIAGNOSIS — Z9181 History of falling: Secondary | ICD-10-CM | POA: Diagnosis not present

## 2021-12-06 DIAGNOSIS — K219 Gastro-esophageal reflux disease without esophagitis: Secondary | ICD-10-CM | POA: Diagnosis not present

## 2021-12-06 DIAGNOSIS — M48061 Spinal stenosis, lumbar region without neurogenic claudication: Secondary | ICD-10-CM | POA: Diagnosis not present

## 2021-12-06 DIAGNOSIS — U071 COVID-19: Secondary | ICD-10-CM | POA: Diagnosis not present

## 2021-12-06 DIAGNOSIS — R001 Bradycardia, unspecified: Secondary | ICD-10-CM | POA: Diagnosis not present

## 2021-12-06 DIAGNOSIS — H353112 Nonexudative age-related macular degeneration, right eye, intermediate dry stage: Secondary | ICD-10-CM

## 2021-12-06 DIAGNOSIS — I110 Hypertensive heart disease with heart failure: Secondary | ICD-10-CM | POA: Diagnosis not present

## 2021-12-06 DIAGNOSIS — G2581 Restless legs syndrome: Secondary | ICD-10-CM | POA: Diagnosis not present

## 2021-12-06 DIAGNOSIS — H35033 Hypertensive retinopathy, bilateral: Secondary | ICD-10-CM | POA: Diagnosis not present

## 2021-12-06 DIAGNOSIS — M810 Age-related osteoporosis without current pathological fracture: Secondary | ICD-10-CM | POA: Diagnosis not present

## 2021-12-06 DIAGNOSIS — I480 Paroxysmal atrial fibrillation: Secondary | ICD-10-CM | POA: Diagnosis not present

## 2021-12-06 DIAGNOSIS — I5032 Chronic diastolic (congestive) heart failure: Secondary | ICD-10-CM | POA: Diagnosis not present

## 2021-12-06 DIAGNOSIS — I700 Atherosclerosis of aorta: Secondary | ICD-10-CM | POA: Diagnosis not present

## 2021-12-06 DIAGNOSIS — Z87891 Personal history of nicotine dependence: Secondary | ICD-10-CM | POA: Diagnosis not present

## 2021-12-06 DIAGNOSIS — F3341 Major depressive disorder, recurrent, in partial remission: Secondary | ICD-10-CM | POA: Diagnosis not present

## 2021-12-06 DIAGNOSIS — D509 Iron deficiency anemia, unspecified: Secondary | ICD-10-CM | POA: Diagnosis not present

## 2021-12-06 DIAGNOSIS — I10 Essential (primary) hypertension: Secondary | ICD-10-CM | POA: Diagnosis not present

## 2021-12-06 DIAGNOSIS — J449 Chronic obstructive pulmonary disease, unspecified: Secondary | ICD-10-CM | POA: Diagnosis not present

## 2021-12-06 DIAGNOSIS — E538 Deficiency of other specified B group vitamins: Secondary | ICD-10-CM | POA: Diagnosis not present

## 2021-12-06 DIAGNOSIS — Z7901 Long term (current) use of anticoagulants: Secondary | ICD-10-CM | POA: Diagnosis not present

## 2021-12-06 DIAGNOSIS — E876 Hypokalemia: Secondary | ICD-10-CM | POA: Diagnosis not present

## 2021-12-06 DIAGNOSIS — H43813 Vitreous degeneration, bilateral: Secondary | ICD-10-CM

## 2021-12-06 DIAGNOSIS — I70213 Atherosclerosis of native arteries of extremities with intermittent claudication, bilateral legs: Secondary | ICD-10-CM | POA: Diagnosis not present

## 2021-12-11 ENCOUNTER — Encounter: Payer: Self-pay | Admitting: Family Medicine

## 2021-12-11 ENCOUNTER — Ambulatory Visit (INDEPENDENT_AMBULATORY_CARE_PROVIDER_SITE_OTHER): Payer: Medicare Other | Admitting: Family Medicine

## 2021-12-11 ENCOUNTER — Other Ambulatory Visit: Payer: Self-pay

## 2021-12-11 VITALS — BP 132/80 | HR 53 | Temp 97.9°F | Ht 66.0 in | Wt 141.6 lb

## 2021-12-11 DIAGNOSIS — G2581 Restless legs syndrome: Secondary | ICD-10-CM | POA: Diagnosis not present

## 2021-12-11 DIAGNOSIS — F3341 Major depressive disorder, recurrent, in partial remission: Secondary | ICD-10-CM | POA: Diagnosis not present

## 2021-12-11 DIAGNOSIS — I10 Essential (primary) hypertension: Secondary | ICD-10-CM | POA: Diagnosis not present

## 2021-12-11 DIAGNOSIS — J449 Chronic obstructive pulmonary disease, unspecified: Secondary | ICD-10-CM

## 2021-12-11 DIAGNOSIS — I5032 Chronic diastolic (congestive) heart failure: Secondary | ICD-10-CM

## 2021-12-11 DIAGNOSIS — I4891 Unspecified atrial fibrillation: Secondary | ICD-10-CM

## 2021-12-11 DIAGNOSIS — I701 Atherosclerosis of renal artery: Secondary | ICD-10-CM | POA: Diagnosis not present

## 2021-12-11 DIAGNOSIS — E785 Hyperlipidemia, unspecified: Secondary | ICD-10-CM | POA: Diagnosis not present

## 2021-12-11 NOTE — Patient Instructions (Addendum)
Health Maintenance Due  Topic Date Due   OPHTHALMOLOGY EXAM  08/21/2021   No labs today - we can have these done at your next visit in 2 months.  Please schedule a follow-up with Dr. Allena Katz from Neurology to discuss about your occupational  therapist suggesting that you may have Parkinson's Disease. I would like to hear her expert opinion - I do not see enough evidence today to justify this claim.  Recommended follow up: Return in about 2 months (around 02/08/2022) for a follow-up or sooner if needed.

## 2021-12-11 NOTE — Progress Notes (Signed)
Phone [phone] In person visit   Subjective:   Cheryl Atkinson is a 88 y.o. year old very pleasant female patient who presents for/with See problem oriented charting Chief Complaint  Patient presents with   Follow-up    This visit occurred during the SARS-CoV-2 public health emergency.  Safety protocols were in place, including screening questions prior to the visit, additional usage of staff PPE, and extensive cleaning of exam room while observing appropriate contact time as indicated for disinfecting solutions.   Past Medical History-  Patient Active Problem List   Diagnosis Date Noted   Pleural effusion due to CHF (congestive heart failure) ([place]) 05/26/2020    Priority: High   Heart failure with preserved ejection fraction ([place]) 03/01/2020    Priority: High   Bacteremia 01/28/2020    Priority: High   Atrial fibrillation ([place]) 01/25/2020    Priority: High   Memory loss 04/11/2016    Priority: High   Renal artery stenosis ([place]) 09/27/2015    Priority: High   Claudication ([place]) 05/04/2015    Priority: High   Atherosclerotic PVD with intermittent claudication ([place]) 04/26/2015    Priority: High   DNR (do not resuscitate) 09/29/2014    Priority: High   Hyperglycemia 07/19/2014    Priority: High   LOW BACK PAIN 09/25/2007    Priority: High   Insomnia 03/01/2020    Priority: Medium    Major depression 03/25/2018    Priority: Medium    BPPV (benign paroxysmal positional vertigo) 07/12/2016    Priority: Medium    Hyperlipidemia 06/30/2015    Priority: Medium    Former smoker 09/29/2014    Priority: Medium    COPD (chronic obstructive pulmonary disease) ([place]) 09/20/2009    Priority: Medium    Iron deficiency anemia 11/09/2007    Priority: Medium    RESTLESS LEG SYNDROME 09/25/2007    Priority: Medium    Essential hypertension 09/25/2007    Priority: Medium    GERD 09/25/2007    Priority: Medium    Osteoporosis 09/25/2007    Priority: Medium    S/P  laparoscopic cholecystectomy 04/04/2020    Priority: Low   Abdominal aortic ectasia ([place]) 12/29/2019    Priority: Low   Cat bite of right hand 12/21/2016    Priority: Low   Mallet toe of right foot 10/20/2015    Priority: Low   Tinnitus 12/31/2014    Priority: Low   Multinodular goiter 08/30/2013    Priority: Low   Spinal stenosis of lumbar region at multiple levels 09/29/2012    Priority: [place], BILATERAL 07/16/2010    Priority: Low   CONSTIPATION, CHRONIC 09/20/2009    Priority: Low   History of UTI 11/09/2007    Priority: Low   COVID-19 08/30/2021   COVID-19 virus infection 08/29/2021   Renal lesion 08/29/2021   B12 deficiency 02/27/2021   Hypokalemia 12/21/2016    Medications- reviewed and updated Current Outpatient Medications  Medication Sig Dispense Refill   Albuterol Sulfate (PROAIR RESPICLICK) 123XX123 (90 Base) MCG/ACT AEPB Inhale 2 puffs into the lungs every 6 (six) hours as needed (shortness of breath from COPD). 1 each 5   apixaban (ELIQUIS) 2.5 MG TABS tablet Take 1 tablet (2.5 mg total) by mouth 2 (two) times daily.     CALCIUM PO Take by mouth.     carbidopa-levodopa (SINEMET IR) 25-100 MG tablet Take daily as needed for worsening RLS. 90 tablet 1   Cholecalciferol (VITAMIN D3 PO) Take by  mouth.     ferrous sulfate 325 (65 FE) MG tablet Take 325 mg by mouth 2 (two) times a week. Patient takes one tablet on Monday and Friday     furosemide (LASIX) 40 MG tablet TAKE 1 TABLET TWICE A DAY 180 tablet 3   irbesartan (AVAPRO) 300 MG tablet Take 1 tablet (300 mg total) by mouth daily. 90 tablet 3   LINZESS 145 MCG CAPS capsule Take 145 mcg by mouth daily as needed (constipation).     Multiple Vitamins-Minerals (PRESERVISION AREDS 2 PO) Take 1 tablet by mouth 2 (two) times daily.     polyethylene glycol (MIRALAX / GLYCOLAX) 17 g packet Take 17 g by mouth 2 (two) times daily. Reported on 03/14/2016 (Patient taking differently: Take 17 g by mouth daily as needed for  mild constipation.)     potassium chloride SA (KLOR-CON) 10 MEQ tablet Take 1 tablet (10 mEq total) by mouth 2 (two) times daily as needed. Needs to be taken  With each dose of lasix     rosuvastatin (CRESTOR) 40 MG tablet TAKE 1 TABLET DAILY 90 tablet 3   Rotigotine (NEUPRO) 3 MG/24HR PT24 PLACE 1 PATCH (3 MG) ONTO THE SKIN AT BEDTIME (Patient taking differently: Place 3 mg onto the skin at bedtime.) 90 patch 3   traMADol (ULTRAM) 50 MG tablet Take 1 tablet (50 mg total) by mouth every 6 (six) hours as needed. 30 tablet 0   traZODone (DESYREL) 50 MG tablet Take 1.5 tablets (75 mg total) by mouth at bedtime as needed for sleep. 135 tablet 3   vitamin B-12 (CYANOCOBALAMIN) 1000 MCG tablet Take 1,000 mcg by mouth daily.     No current facility-administered medications for this visit.     Objective:  BP 132/80    Pulse (!) 53    Temp 97.9 F (36.6 C) (Temporal)    Ht 5\' 6"  (1.676 m)    Wt 141 lb 9.6 oz (64.2 kg)    LMP  (LMP Unknown)    SpO2 99%    BMI 22.85 kg/m  Gen: NAD, resting comfortably CV: RRR no murmurs rubs or gallops Lungs: CTAB no crackles, wheeze, rhonchi Ext: no edema Skin: warm, dry Neuro: in wheelchair today    Assessment and Plan   #Recap from last visit-patient with prior hospitalization for COVID-19 in late September 2022-head CT ordered with no acute intracranial pathology.  Also had CT angio of chest and abdomen/pelvis-no pulmonary embolism was noted, small layering bilateral effusions, moderate to marked cardiac enlargement, three-vessel coronary artery disease noted.  Stable left renal lesion from 2021-likely benign -Also had compression fracture noted -Blood and urine cultures without clear infectious source -She did have pancytopenia thought related to infection but only anemia on follow-up CBC in our office -Patient had appeared to fully recover at time of last visit - Did have some lingering hand numbness on the right hand worse than the left but did not think  she could tolerate MRI--today reports largely resolved  - home health PT/OT coming to an end- I am concerned she may worsen again- and I am in support of extending these services  # Atrial fibrillation- new onset 2021 S: Rate controlled without medication  Anticoagulated with eliquis 2.5 mg BID- appropriately adjusted - consider stopping eliquis if any further falls- no recent falls Patient is  followed by cardiology: Dr. Gwenlyn Found  A/P: Appropriately anticoagulated and rate controlled-continue current medication  #CHF with preserved EF #Hypertension S: Compliant with irbesartan 300 mg (  ok per cards as only unilateral renal artery stenosis) and lasix 40mg  once a day right now- has 2nd dose available if needed -off amlodipine 5 mg right now BP Readings from Last 3 Encounters:  12/11/21 132/80  10/17/21 (!) 157/84  10/09/21 136/84  A/P: blood pressure well controlled. Weight up some for CHF but had her wonderful son in cooking for her over holidays and no edema- appears euvolemic. Continue current meds- doing well off amlodipine   #Hyperlipidemia/peripheral vascular disease/renal artery stenosis S: Compliant with atorvastatin 40 mg-->rosuvastatin 40mg  with LDL above 70 in early 2022 and also on eliquis (prior plavix/asa prior to a fib). She has known atherosclerotic peripheral vascular disease with intermittent claudication-stable with claudication at approximately 200 feet in past but more sedentary- no issues with shorter walks Lab Results  Component Value Date   CHOL 169 12/04/2020   HDL 80.50 12/04/2020   LDLCALC 78 12/04/2020   LDLDIRECT 60.0 10/09/2021   TRIG 54.0 12/04/2020   CHOLHDL 2 12/04/2020  A/P: Hyperlipidemia at goal for peripheral vascular disease and renal artery stenosis with LDL under 70.  Also on Eliquis so we are avoiding aspirin.  She is rather sedentary but still does have claudication   In regards to renal arteyr stenosis- appeared actually normal in 2019- continue  risk factor modification but hold off on repeat imaging  #Major depression S: Patient has denied counseling and medications in the past. Seems to have been helped with staying more engaged with friends and family.  A/P: phq9 score of 7 today- main issues sleep and concentration (both 3). Concentration is a stress related issue with her son- factoring that out would be under 5- she still prefers to remain of fmeds   #Restless leg syndrome-follows with Dr. Posey Pronto S: Patient on Neupro 3 mg. Ferritin levels have been low in the past. Now on carbidopa-levodopa A/P: overall resltess legs much better- continue current meds and neuro follow up  -Apparently home health nurse was concerned about Parkinson's-patient has seen Dr. Posey Pronto in the past-encouraged follow-up questions with Dr. Posey Pronto- I think this may just be an misunderstanding based on the sinemet being used  #Insulin resistance/hyperglycemia S: prior metformin 500 milligrams daily- diarrhea and low b12 Lab Results  Component Value Date   HGBA1C 6.4 07/17/2021  A/P: will be due for repeat next visit- continue to hold off on meds unless this gets above 7/new diagnosis diabets   #COPD S: Finds albuterol helpful starting 2020- rare use. She had improvement in shortness of breath when she quit smoking.  A/P: reasonable control with sparing albuterol - continue current treatment   Recommended follow up: 2 month follow up- update labs at that time and consider 3 months if still doing well Future Appointments  Date Time Provider [place]  12/27/2021 10:30 AM [facility] PEC  01/03/2022  8:15 AM Hayden Pedro, MD TRE-TRE None  08/08/2022  9:30 AM [facility] HEALTH COACH [facility] PEC  10/18/2022  8:30 AM Patel, Arvin Collard K, DO LBN-[facility] None    Lab/Order associations:   ICD-10-CM   1. Atrial fibrillation, unspecified type ([place])  I48.91     2. Essential hypertension  I10     3. Hyperlipidemia, unspecified hyperlipidemia  type  E78.5     4. RESTLESS LEG SYNDROME  G25.81     5. Recurrent major depressive disorder, in partial remission ([place])  F33.41     6. Chronic obstructive pulmonary disease, unspecified COPD type ([place])  J44.9  7. Chronic heart failure with preserved ejection fraction (HCC) Chronic I50.32     8. Atherosclerosis of renal artery (HCC) Chronic I70.1     9. Renal artery stenosis (HCC)  I70.1       No orders of the defined types were placed in this encounter.  I,Harris Phan,acting as a Education administrator for Garret Reddish, MD.,have documented all relevant documentation on the behalf of Garret Reddish, MD,as directed by  Garret Reddish, MD while in the presence of Garret Reddish, MD.   I, Garret Reddish, MD, have reviewed all documentation for this visit. The documentation on 12/11/21 for the exam, diagnosis, procedures, and orders are all accurate and complete.   Return precautions advised.  Garret Reddish, MD

## 2021-12-14 DIAGNOSIS — U071 COVID-19: Secondary | ICD-10-CM | POA: Diagnosis not present

## 2021-12-14 DIAGNOSIS — I70213 Atherosclerosis of native arteries of extremities with intermittent claudication, bilateral legs: Secondary | ICD-10-CM | POA: Diagnosis not present

## 2021-12-14 DIAGNOSIS — I5032 Chronic diastolic (congestive) heart failure: Secondary | ICD-10-CM | POA: Diagnosis not present

## 2021-12-14 DIAGNOSIS — D509 Iron deficiency anemia, unspecified: Secondary | ICD-10-CM | POA: Diagnosis not present

## 2021-12-14 DIAGNOSIS — I110 Hypertensive heart disease with heart failure: Secondary | ICD-10-CM | POA: Diagnosis not present

## 2021-12-14 DIAGNOSIS — J449 Chronic obstructive pulmonary disease, unspecified: Secondary | ICD-10-CM | POA: Diagnosis not present

## 2021-12-19 DIAGNOSIS — I5032 Chronic diastolic (congestive) heart failure: Secondary | ICD-10-CM | POA: Diagnosis not present

## 2021-12-19 DIAGNOSIS — U071 COVID-19: Secondary | ICD-10-CM | POA: Diagnosis not present

## 2021-12-19 DIAGNOSIS — I70213 Atherosclerosis of native arteries of extremities with intermittent claudication, bilateral legs: Secondary | ICD-10-CM | POA: Diagnosis not present

## 2021-12-19 DIAGNOSIS — D509 Iron deficiency anemia, unspecified: Secondary | ICD-10-CM | POA: Diagnosis not present

## 2021-12-19 DIAGNOSIS — I110 Hypertensive heart disease with heart failure: Secondary | ICD-10-CM | POA: Diagnosis not present

## 2021-12-19 DIAGNOSIS — J449 Chronic obstructive pulmonary disease, unspecified: Secondary | ICD-10-CM | POA: Diagnosis not present

## 2021-12-24 DIAGNOSIS — I110 Hypertensive heart disease with heart failure: Secondary | ICD-10-CM | POA: Diagnosis not present

## 2021-12-24 DIAGNOSIS — J449 Chronic obstructive pulmonary disease, unspecified: Secondary | ICD-10-CM | POA: Diagnosis not present

## 2021-12-24 DIAGNOSIS — D509 Iron deficiency anemia, unspecified: Secondary | ICD-10-CM | POA: Diagnosis not present

## 2021-12-24 DIAGNOSIS — U071 COVID-19: Secondary | ICD-10-CM | POA: Diagnosis not present

## 2021-12-24 DIAGNOSIS — I5032 Chronic diastolic (congestive) heart failure: Secondary | ICD-10-CM | POA: Diagnosis not present

## 2021-12-24 DIAGNOSIS — I70213 Atherosclerosis of native arteries of extremities with intermittent claudication, bilateral legs: Secondary | ICD-10-CM | POA: Diagnosis not present

## 2021-12-26 DIAGNOSIS — D509 Iron deficiency anemia, unspecified: Secondary | ICD-10-CM | POA: Diagnosis not present

## 2021-12-26 DIAGNOSIS — I110 Hypertensive heart disease with heart failure: Secondary | ICD-10-CM | POA: Diagnosis not present

## 2021-12-26 DIAGNOSIS — I70213 Atherosclerosis of native arteries of extremities with intermittent claudication, bilateral legs: Secondary | ICD-10-CM | POA: Diagnosis not present

## 2021-12-26 DIAGNOSIS — J449 Chronic obstructive pulmonary disease, unspecified: Secondary | ICD-10-CM | POA: Diagnosis not present

## 2021-12-26 DIAGNOSIS — I5032 Chronic diastolic (congestive) heart failure: Secondary | ICD-10-CM | POA: Diagnosis not present

## 2021-12-26 DIAGNOSIS — U071 COVID-19: Secondary | ICD-10-CM | POA: Diagnosis not present

## 2021-12-27 ENCOUNTER — Ambulatory Visit: Payer: Medicare Other | Admitting: *Deleted

## 2021-12-27 DIAGNOSIS — J449 Chronic obstructive pulmonary disease, unspecified: Secondary | ICD-10-CM

## 2021-12-27 DIAGNOSIS — W19XXXA Unspecified fall, initial encounter: Secondary | ICD-10-CM

## 2021-12-27 DIAGNOSIS — I5032 Chronic diastolic (congestive) heart failure: Secondary | ICD-10-CM

## 2021-12-27 NOTE — Chronic Care Management (AMB) (Signed)
Chronic Care Management   CCM RN Visit Note  12/27/2021 Name: Cheryl Atkinson MRN: [id number] DOB: 24-Jun-1931  Subjective: Cheryl Atkinson is a 88 y.o. year old female who is a primary care patient of Durene Cal, [name], MD. The care management team was consulted for assistance with disease management and care coordination needs.    Engaged with patient by telephone for follow up visit in response to provider referral for case management and/or care coordination services.   Consent to Services:  The patient was given information about Chronic Care Management services, agreed to services, and gave verbal consent prior to initiation of services.  Please see initial visit note for detailed documentation.   Patient agreed to services and verbal consent obtained.   Assessment: Review of patient past medical history, allergies, medications, health status, including review of consultants reports, laboratory and other test data, was performed as part of comprehensive evaluation and provision of chronic care management services.   SDOH (Social Determinants of Health) assessments and interventions performed:    CCM Care Plan  Allergies  Allergen Reactions   Codeine     hallucinations    Outpatient Encounter Medications as of 12/27/2021  Medication Sig Note   Albuterol Sulfate (PROAIR RESPICLICK) 108 (90 Base) MCG/ACT AEPB Inhale 2 puffs into the lungs every 6 (six) hours as needed (shortness of breath from COPD).    apixaban (ELIQUIS) 2.5 MG TABS tablet Take 1 tablet (2.5 mg total) by mouth 2 (two) times daily.    CALCIUM PO Take by mouth.    carbidopa-levodopa (SINEMET IR) 25-100 MG tablet Take daily as needed for worsening RLS.    Cholecalciferol (VITAMIN D3 PO) Take by mouth.    ferrous sulfate 325 (65 FE) MG tablet Take 325 mg by mouth 2 (two) times a week. Patient takes one tablet on Monday and Friday    furosemide (LASIX) 40 MG tablet TAKE 1 TABLET TWICE A DAY    irbesartan (AVAPRO)  300 MG tablet Take 1 tablet (300 mg total) by mouth daily. 08/30/2021: Last filled 7.2.22 for 90 days supply   LINZESS 145 MCG CAPS capsule Take 145 mcg by mouth daily as needed (constipation).    Multiple Vitamins-Minerals (PRESERVISION AREDS 2 PO) Take 1 tablet by mouth 2 (two) times daily.    polyethylene glycol (MIRALAX / GLYCOLAX) 17 g packet Take 17 g by mouth 2 (two) times daily. Reported on 03/14/2016 (Patient taking differently: Take 17 g by mouth daily as needed for mild constipation.)    potassium chloride SA (KLOR-CON) 10 MEQ tablet Take 1 tablet (10 mEq total) by mouth 2 (two) times daily as needed. Needs to be taken  With each dose of lasix    rosuvastatin (CRESTOR) 40 MG tablet TAKE 1 TABLET DAILY    Rotigotine (NEUPRO) 3 MG/24HR PT24 PLACE 1 PATCH (3 MG) ONTO THE SKIN AT BEDTIME (Patient taking differently: Place 3 mg onto the skin at bedtime.) 08/30/2021: Last filled 7.2.22 for 90 days supply   traMADol (ULTRAM) 50 MG tablet Take 1 tablet (50 mg total) by mouth every 6 (six) hours as needed.    traZODone (DESYREL) 50 MG tablet Take 1.5 tablets (75 mg total) by mouth at bedtime as needed for sleep. 08/30/2021: Last filled 8.20.22 for 90 days supply   vitamin B-12 (CYANOCOBALAMIN) 1000 MCG tablet Take 1,000 mcg by mouth daily.    No facility-administered encounter medications on file as of 12/27/2021.    Patient Active Problem List   Diagnosis  Date Noted   COVID-19 08/30/2021   COVID-19 virus infection 08/29/2021   Renal lesion 08/29/2021   B12 deficiency 02/27/2021   Pleural effusion due to CHF (congestive heart failure) (HCC) 05/26/2020   S/P laparoscopic cholecystectomy 04/04/2020   Heart failure with preserved ejection fraction (HCC) 03/01/2020   Insomnia 03/01/2020   Bacteremia 01/28/2020   Atrial fibrillation (HCC) 01/25/2020   Abdominal aortic ectasia (HCC) 12/29/2019   Major depression 03/25/2018   Hypokalemia 12/21/2016   Cat bite of right hand 12/21/2016   BPPV  (benign paroxysmal positional vertigo) 07/12/2016   Memory loss 04/11/2016   Mallet toe of right foot 10/20/2015   Renal artery stenosis (HCC) 09/27/2015   Hyperlipidemia 06/30/2015   Claudication (HCC) 05/04/2015   Atherosclerotic PVD with intermittent claudication (HCC) 04/26/2015   Tinnitus 12/31/2014   Former smoker 09/29/2014   DNR (do not resuscitate) 09/29/2014   Hyperglycemia 07/19/2014   Multinodular goiter 08/30/2013   Spinal stenosis of lumbar region at multiple levels 09/29/2012   HIP PAIN, BILATERAL 07/16/2010   COPD (chronic obstructive pulmonary disease) (HCC) 09/20/2009   CONSTIPATION, CHRONIC 09/20/2009   Iron deficiency anemia 11/09/2007   History of UTI 11/09/2007   RESTLESS LEG SYNDROME 09/25/2007   Essential hypertension 09/25/2007   GERD 09/25/2007   LOW BACK PAIN 09/25/2007   Osteoporosis 09/25/2007    Conditions to be addressed/monitored:CHF and Falls  [facility]  General Plan of Care (Adult)  Updates made by Maple Mirzaarpley, Jaylynn Siefert D, RN since 12/27/2021 12:00 AM     Problem: Inability to care for self based on knowledge deficit   Priority: High     Long-Range Goal: Patient will work with CCM team to gain knowledge of self care management of chronic conditions   Start Date: 10/23/2021  Expected End Date: 10/23/2022  Recent Progress: Not on track  Priority: High  Note:   Current Barriers   Knowledge Deficits related to plan of care for management of CHF and Falls  Chronic Disease Management support and education needs related to CHF and Falls Literacy barriers    12/05/21-Spoke with patient, states she isdoing well. Denies any recent falls.  Ambulating with walker.  Has not weighed but feels weight increased from holiday eating.  Denies any lightheadiness or dizzines or shortness of breath.  States she has minimal chest pain with exertion (does not feel like her heart per patient) that resolves with rest.  Encouraged to contact primary care t discuss and  if at any point pain does not go awat to go to emergency room.  Continues to work with home health therapy.  1/26--Patient stating she is doing well.   Denies any recent falls.  Denies any increase in SOB; Reports using rescue inhaler about 3 times in the past few week..  Denies any chest pains, stating they resolved on their own.  Weight remains 140 (states she wants to loose down to 135).  Denies any issues or concerns                                         RNCM Clinical Goal(s):    Patient will verbalize understanding of plan for management of CHF and Falls as evidenced by daily weight monitoring verbalize basic understanding of CHF and falls disease process and self health management plan as evidenced by monitoring weights daily and no hospital readmissions take all medications exactly as  prescribed and will call provider for medication related questions as evidenced by medication compliance    demonstrate improved health management independence as evidenced by no hospital admissions or falls in the next 90days        through collaboration with RN Care manager, provider, and care team.   Interventions: 1:1 collaboration with primary care provider regarding development and update of comprehensive plan of care as evidenced by provider attestation and co-signature Inter-disciplinary care team collaboration (see longitudinal plan of care) Evaluation of current treatment plan related to  self management and patient's adherence to plan as established by provider   Heart Failure Interventions:  (Status: Goal on Track (progressing): YES. No Needs Identified this visit)  Long Term Goal  Basic overview and discussion of pathophysiology of Heart Failure reviewed Provided education on low sodium diet Reviewed Heart Failure Action Plan in depth and provided written copy Advised patient to weigh each morning after emptying bladder Discussed importance of daily weight and advised patient to weigh and  record daily Reviewed role of diuretics in prevention of fluid overload and management of heart failure Discussed the importance of keeping all appointments with provider Provided patient with education about the role of exercise in the management of heart failure Discussed purpose and reasoning of weighing daily and encouraged patient to do so Discussed when to call provider based on weights  Falls:  (Status: Goal on Track (progressing): YES.) Long Term Goal  Provided written and verbal education re: potential causes of falls and Fall prevention strategies Reviewed medications and discussed potential side effects of medications such as dizziness and frequent urination Advised patient of importance of notifying provider of falls Assessed for signs and symptoms of orthostatic hypotension Assessed for falls since last encounter Assessed patients knowledge of fall risk prevention secondary to previously provided education Fall precautions and preventions reviewed and discussed Instructed to use walker with all ambulation Encouraged to attend scheduled appointments  Patient Goals/Self-Care Activities: Take medications as prescribed   Attend all scheduled provider appointments Perform all self care activities independently  call office if I gain more than 2 pounds in one day or 5 pounds in one week weigh myself daily bring diary to all appointments develop a rescue plan follow rescue plan if symptoms flare-up know when to call the doctor based on weight and symptoms track symptoms and what helps feel better or worse Obtain no skid socks Use walker with all ambulation       Plan:The care management team will reach out to the patient again over the next 45 days.  Rhae Lerner RN, MSN RN Care Management Coordinator  [facility] [phone] [email or web address]

## 2021-12-27 NOTE — Patient Instructions (Signed)
Visit Information  Thank you for taking time to visit with me today. Please don't hesitate to contact me if I can be of assistance to you before our next scheduled telephone appointment.  Following are the goals we discussed today:  Take medications as prescribed   Attend all scheduled provider appointments Perform all self care activities independently  call office if I gain more than 2 pounds in one day or 5 pounds in one week weigh myself daily bring diary to all appointments develop a rescue plan follow rescue plan if symptoms flare-up know when to call the doctor based on weight and symptoms track symptoms and what helps feel better or worse Obtain no skid socks Use walker with all ambulation  Our next appointment is by telephone on 3/9 at 1130  Please call the care guide team at [phone] if you need to cancel or reschedule your appointment.   If you are experiencing a Mental Health or Behavioral Health Crisis or need someone to talk to, please call the Suicide and Crisis Lifeline: 988 call the [place] National Suicide Prevention Lifeline: (931) 385-7081 or TTY: 365-685-2163 TTY 303-399-5146) to talk to a trained counselor call 1-800-273-TALK (toll free, 24 hour hotline) go to [facility] [address], [place] [phone]) call 911   The patient verbalized understanding of instructions, educational materials, and care plan provided today and declined offer to receive copy of patient instructions, educational materials, and care plan.   Rhae Lerner RN, MSN RN Care Management Coordinator  [facility] [phone] [email or web address]

## 2021-12-28 ENCOUNTER — Telehealth: Payer: Self-pay

## 2021-12-28 NOTE — Telephone Encounter (Signed)
Type of form received: Home health Certification    Form should be Faxed to: [facility] - [phone]  Order # - [id number]  Form placed:  Deloria Lair  Attach charge sheet.   Individual made aware of 3-5 business day turn around (Y/N)?

## 2022-01-01 DIAGNOSIS — I10 Essential (primary) hypertension: Secondary | ICD-10-CM

## 2022-01-01 DIAGNOSIS — I5032 Chronic diastolic (congestive) heart failure: Secondary | ICD-10-CM

## 2022-01-02 DIAGNOSIS — I70213 Atherosclerosis of native arteries of extremities with intermittent claudication, bilateral legs: Secondary | ICD-10-CM | POA: Diagnosis not present

## 2022-01-02 DIAGNOSIS — U071 COVID-19: Secondary | ICD-10-CM | POA: Diagnosis not present

## 2022-01-02 DIAGNOSIS — J449 Chronic obstructive pulmonary disease, unspecified: Secondary | ICD-10-CM | POA: Diagnosis not present

## 2022-01-02 DIAGNOSIS — I5032 Chronic diastolic (congestive) heart failure: Secondary | ICD-10-CM | POA: Diagnosis not present

## 2022-01-02 DIAGNOSIS — D509 Iron deficiency anemia, unspecified: Secondary | ICD-10-CM | POA: Diagnosis not present

## 2022-01-02 DIAGNOSIS — I110 Hypertensive heart disease with heart failure: Secondary | ICD-10-CM | POA: Diagnosis not present

## 2022-01-03 ENCOUNTER — Other Ambulatory Visit: Payer: Self-pay

## 2022-01-03 ENCOUNTER — Encounter (INDEPENDENT_AMBULATORY_CARE_PROVIDER_SITE_OTHER): Payer: Medicare Other | Admitting: Ophthalmology

## 2022-01-03 DIAGNOSIS — H43813 Vitreous degeneration, bilateral: Secondary | ICD-10-CM

## 2022-01-03 DIAGNOSIS — H35033 Hypertensive retinopathy, bilateral: Secondary | ICD-10-CM

## 2022-01-03 DIAGNOSIS — H353112 Nonexudative age-related macular degeneration, right eye, intermediate dry stage: Secondary | ICD-10-CM | POA: Diagnosis not present

## 2022-01-03 DIAGNOSIS — H353221 Exudative age-related macular degeneration, left eye, with active choroidal neovascularization: Secondary | ICD-10-CM | POA: Diagnosis not present

## 2022-01-03 DIAGNOSIS — I10 Essential (primary) hypertension: Secondary | ICD-10-CM

## 2022-01-04 DIAGNOSIS — D509 Iron deficiency anemia, unspecified: Secondary | ICD-10-CM | POA: Diagnosis not present

## 2022-01-04 DIAGNOSIS — I110 Hypertensive heart disease with heart failure: Secondary | ICD-10-CM | POA: Diagnosis not present

## 2022-01-04 DIAGNOSIS — U071 COVID-19: Secondary | ICD-10-CM | POA: Diagnosis not present

## 2022-01-04 DIAGNOSIS — I70213 Atherosclerosis of native arteries of extremities with intermittent claudication, bilateral legs: Secondary | ICD-10-CM | POA: Diagnosis not present

## 2022-01-04 DIAGNOSIS — I5032 Chronic diastolic (congestive) heart failure: Secondary | ICD-10-CM | POA: Diagnosis not present

## 2022-01-04 DIAGNOSIS — J449 Chronic obstructive pulmonary disease, unspecified: Secondary | ICD-10-CM | POA: Diagnosis not present

## 2022-01-05 DIAGNOSIS — J449 Chronic obstructive pulmonary disease, unspecified: Secondary | ICD-10-CM | POA: Diagnosis not present

## 2022-01-05 DIAGNOSIS — I480 Paroxysmal atrial fibrillation: Secondary | ICD-10-CM | POA: Diagnosis not present

## 2022-01-05 DIAGNOSIS — I5032 Chronic diastolic (congestive) heart failure: Secondary | ICD-10-CM | POA: Diagnosis not present

## 2022-01-05 DIAGNOSIS — R001 Bradycardia, unspecified: Secondary | ICD-10-CM | POA: Diagnosis not present

## 2022-01-05 DIAGNOSIS — K219 Gastro-esophageal reflux disease without esophagitis: Secondary | ICD-10-CM | POA: Diagnosis not present

## 2022-01-05 DIAGNOSIS — Z9181 History of falling: Secondary | ICD-10-CM | POA: Diagnosis not present

## 2022-01-05 DIAGNOSIS — Z7901 Long term (current) use of anticoagulants: Secondary | ICD-10-CM | POA: Diagnosis not present

## 2022-01-05 DIAGNOSIS — M48061 Spinal stenosis, lumbar region without neurogenic claudication: Secondary | ICD-10-CM | POA: Diagnosis not present

## 2022-01-05 DIAGNOSIS — I70213 Atherosclerosis of native arteries of extremities with intermittent claudication, bilateral legs: Secondary | ICD-10-CM | POA: Diagnosis not present

## 2022-01-05 DIAGNOSIS — G2581 Restless legs syndrome: Secondary | ICD-10-CM | POA: Diagnosis not present

## 2022-01-05 DIAGNOSIS — E538 Deficiency of other specified B group vitamins: Secondary | ICD-10-CM | POA: Diagnosis not present

## 2022-01-05 DIAGNOSIS — I110 Hypertensive heart disease with heart failure: Secondary | ICD-10-CM | POA: Diagnosis not present

## 2022-01-05 DIAGNOSIS — Z87891 Personal history of nicotine dependence: Secondary | ICD-10-CM | POA: Diagnosis not present

## 2022-01-05 DIAGNOSIS — F3341 Major depressive disorder, recurrent, in partial remission: Secondary | ICD-10-CM | POA: Diagnosis not present

## 2022-01-05 DIAGNOSIS — M810 Age-related osteoporosis without current pathological fracture: Secondary | ICD-10-CM | POA: Diagnosis not present

## 2022-01-05 DIAGNOSIS — D509 Iron deficiency anemia, unspecified: Secondary | ICD-10-CM | POA: Diagnosis not present

## 2022-01-05 DIAGNOSIS — I700 Atherosclerosis of aorta: Secondary | ICD-10-CM | POA: Diagnosis not present

## 2022-01-05 DIAGNOSIS — U071 COVID-19: Secondary | ICD-10-CM | POA: Diagnosis not present

## 2022-01-05 DIAGNOSIS — E876 Hypokalemia: Secondary | ICD-10-CM | POA: Diagnosis not present

## 2022-01-08 DIAGNOSIS — I5032 Chronic diastolic (congestive) heart failure: Secondary | ICD-10-CM | POA: Diagnosis not present

## 2022-01-08 DIAGNOSIS — I70213 Atherosclerosis of native arteries of extremities with intermittent claudication, bilateral legs: Secondary | ICD-10-CM | POA: Diagnosis not present

## 2022-01-08 DIAGNOSIS — U071 COVID-19: Secondary | ICD-10-CM | POA: Diagnosis not present

## 2022-01-08 DIAGNOSIS — I110 Hypertensive heart disease with heart failure: Secondary | ICD-10-CM | POA: Diagnosis not present

## 2022-01-08 DIAGNOSIS — J449 Chronic obstructive pulmonary disease, unspecified: Secondary | ICD-10-CM | POA: Diagnosis not present

## 2022-01-08 DIAGNOSIS — D509 Iron deficiency anemia, unspecified: Secondary | ICD-10-CM | POA: Diagnosis not present

## 2022-01-17 ENCOUNTER — Other Ambulatory Visit: Payer: Self-pay | Admitting: Family Medicine

## 2022-01-17 DIAGNOSIS — I110 Hypertensive heart disease with heart failure: Secondary | ICD-10-CM | POA: Diagnosis not present

## 2022-01-17 DIAGNOSIS — U071 COVID-19: Secondary | ICD-10-CM | POA: Diagnosis not present

## 2022-01-17 DIAGNOSIS — D509 Iron deficiency anemia, unspecified: Secondary | ICD-10-CM | POA: Diagnosis not present

## 2022-01-17 DIAGNOSIS — J449 Chronic obstructive pulmonary disease, unspecified: Secondary | ICD-10-CM | POA: Diagnosis not present

## 2022-01-17 DIAGNOSIS — I5032 Chronic diastolic (congestive) heart failure: Secondary | ICD-10-CM | POA: Diagnosis not present

## 2022-01-17 DIAGNOSIS — I70213 Atherosclerosis of native arteries of extremities with intermittent claudication, bilateral legs: Secondary | ICD-10-CM | POA: Diagnosis not present

## 2022-01-22 ENCOUNTER — Other Ambulatory Visit: Payer: Self-pay | Admitting: Family Medicine

## 2022-01-22 NOTE — Telephone Encounter (Signed)
The original prescription was reordered on 09/07/2021 by [name], MD. Renewing this prescription may not be appropriate.

## 2022-01-23 ENCOUNTER — Other Ambulatory Visit: Payer: Self-pay | Admitting: *Deleted

## 2022-01-23 MED ORDER — APIXABAN 2.5 MG PO TABS: 2.5000 mg | ORAL_TABLET | Freq: Two times a day (BID) | ORAL | 3 refills | Status: DC

## 2022-01-23 NOTE — Telephone Encounter (Signed)
2.5 mg send to [facility]

## 2022-01-31 ENCOUNTER — Other Ambulatory Visit: Payer: Self-pay

## 2022-01-31 ENCOUNTER — Encounter (INDEPENDENT_AMBULATORY_CARE_PROVIDER_SITE_OTHER): Payer: Medicare Other | Admitting: Ophthalmology

## 2022-01-31 DIAGNOSIS — H353221 Exudative age-related macular degeneration, left eye, with active choroidal neovascularization: Secondary | ICD-10-CM | POA: Diagnosis not present

## 2022-01-31 DIAGNOSIS — H353112 Nonexudative age-related macular degeneration, right eye, intermediate dry stage: Secondary | ICD-10-CM | POA: Diagnosis not present

## 2022-01-31 DIAGNOSIS — H43813 Vitreous degeneration, bilateral: Secondary | ICD-10-CM | POA: Diagnosis not present

## 2022-01-31 DIAGNOSIS — H35033 Hypertensive retinopathy, bilateral: Secondary | ICD-10-CM | POA: Diagnosis not present

## 2022-01-31 DIAGNOSIS — I10 Essential (primary) hypertension: Secondary | ICD-10-CM | POA: Diagnosis not present

## 2022-02-01 NOTE — Progress Notes (Incomplete)
Phone [phone] In person visit   Subjective:   Cheryl Atkinson is a 88 y.o. year old very pleasant female patient who presents for/with See problem oriented charting No chief complaint on file.   This visit occurred during the SARS-CoV-2 public health emergency.  Safety protocols were in place, including screening questions prior to the visit, additional usage of staff PPE, and extensive cleaning of exam room while observing appropriate contact time as indicated for disinfecting solutions.   Past Medical History-  Patient Active Problem List   Diagnosis Date Noted   COVID-19 08/30/2021   COVID-19 virus infection 08/29/2021   Renal lesion 08/29/2021   B12 deficiency 02/27/2021   Pleural effusion due to CHF (congestive heart failure) ([place]) 05/26/2020   S/P laparoscopic cholecystectomy 04/04/2020   Heart failure with preserved ejection fraction ([place]) 03/01/2020   Insomnia 03/01/2020   Bacteremia 01/28/2020   Atrial fibrillation ([place]) 01/25/2020   Abdominal aortic ectasia ([place]) 12/29/2019   Major depression 03/25/2018   Hypokalemia 12/21/2016   Cat bite of right hand 12/21/2016   BPPV (benign paroxysmal positional vertigo) 07/12/2016   Memory loss 04/11/2016   Mallet toe of right foot 10/20/2015   Renal artery stenosis ([place]) 09/27/2015   Hyperlipidemia 06/30/2015   Claudication ([place]) 05/04/2015   Atherosclerotic PVD with intermittent claudication ([place]) 04/26/2015   Tinnitus 12/31/2014   Former smoker 09/29/2014   DNR (do not resuscitate) 09/29/2014   Hyperglycemia 07/19/2014   Multinodular goiter 08/30/2013   Spinal stenosis of lumbar region at multiple levels 09/29/2012   HIP PAIN, BILATERAL 07/16/2010   COPD (chronic obstructive pulmonary disease) ([place]) 09/20/2009   CONSTIPATION, CHRONIC 09/20/2009   Iron deficiency anemia 11/09/2007   History of UTI 11/09/2007   RESTLESS LEG SYNDROME 09/25/2007   Essential hypertension 09/25/2007   GERD 09/25/2007   LOW BACK  PAIN 09/25/2007   Osteoporosis 09/25/2007    Medications- reviewed and updated Current Outpatient Medications  Medication Sig Dispense Refill   Albuterol Sulfate (PROAIR RESPICLICK) 123XX123 (90 Base) MCG/ACT AEPB Inhale 2 puffs into the lungs every 6 (six) hours as needed (shortness of breath from COPD). 1 each 5   apixaban (ELIQUIS) 2.5 MG TABS tablet Take 1 tablet (2.5 mg total) by mouth 2 (two) times daily. 180 tablet 3   CALCIUM PO Take by mouth.     carbidopa-levodopa (SINEMET IR) 25-100 MG tablet Take daily as needed for worsening RLS. 90 tablet 1   Cholecalciferol (VITAMIN D3 PO) Take by mouth.     ferrous sulfate 325 (65 FE) MG tablet Take 325 mg by mouth 2 (two) times a week. Patient takes one tablet on Monday and Friday     furosemide (LASIX) 40 MG tablet TAKE 1 TABLET TWICE A DAY 180 tablet 3   irbesartan (AVAPRO) 300 MG tablet Take 1 tablet (300 mg total) by mouth daily. 90 tablet 3   LINZESS 145 MCG CAPS capsule Take 145 mcg by mouth daily as needed (constipation).     Multiple Vitamins-Minerals (PRESERVISION AREDS 2 PO) Take 1 tablet by mouth 2 (two) times daily.     polyethylene glycol (MIRALAX / GLYCOLAX) 17 g packet Take 17 g by mouth 2 (two) times daily. Reported on 03/14/2016 (Patient taking differently: Take 17 g by mouth daily as needed for mild constipation.)     potassium chloride SA (KLOR-CON) 10 MEQ tablet Take 1 tablet (10 mEq total) by mouth 2 (two) times daily as needed. Needs to be taken  With each dose of  lasix     rosuvastatin (CRESTOR) 40 MG tablet TAKE 1 TABLET DAILY 90 tablet 3   Rotigotine (NEUPRO) 3 MG/24HR PT24 PLACE 1 PATCH (3 MG) ONTO THE SKIN AT BEDTIME (Patient taking differently: Place 3 mg onto the skin at bedtime.) 90 patch 3   traMADol (ULTRAM) 50 MG tablet Take 1 tablet (50 mg total) by mouth every 6 (six) hours as needed. 30 tablet 0   traZODone (DESYREL) 50 MG tablet TAKE ONE AND ONE-HALF TABLETS AT BEDTIME AS NEEDED FOR SLEEP 135 tablet 3   vitamin  B-12 (CYANOCOBALAMIN) 1000 MCG tablet Take 1,000 mcg by mouth daily.     No current facility-administered medications for this visit.     Objective:  LMP  (LMP Unknown)  Gen: NAD, resting comfortably CV: RRR no murmurs rubs or gallops Lungs: CTAB no crackles, wheeze, rhonchi Abdomen: soft/nontender/nondistended/normal bowel sounds. No rebound or guarding.  Ext: no edema Skin: warm, dry Neuro: grossly normal, moves all extremities  ***    Assessment and Plan   ***07/14/2020 awv ***her cousin helps her- Merdis Delay.  ***cholecystectomy 04/04/20 ***palliative consult through [facility] 05/15/20 *** consider stopping eliquis if any further falls***   -Reviewed last notes from GI and had two adenomatous polyps in 2011. Also had AVMs. Dr. Ardis Hughs letter in 2015 recommended GI follow-up. I do not think she would be a great candidate for colonoscopy at this time we will refer her back just for their formal recommendation ***. she has opted out of GI follow up ***   # Atrial fibrillation- new onset 2021 S: Rate controlled without medication  Anticoagulated with ***eliquis 5 mg BID Patient is *** followed by cardiology: Dr. Gwenlyn Found  A/P: ***   #CHF with preserved EF #Hypertension S: Compliant with irbesartan 300 mg (ok per cards as only unilateral renal artery stenosis) and lasix 40mg   -off hctz since starting lasix -off amlodiine march 2021 with edema issues and again 2022 . Blood pressure high in past with caffeine intake Home readings #s: *** BP Readings from Last 3 Encounters:  12/11/21 132/80  10/17/21 (!) 157/84  10/09/21 136/84  A/P: ***  #Major depression S: Patient had denied counseling and medications in the past. Seemed to had been helped with staying more engaged with friends and family. A/P: ***   #Restless leg syndrome S: Patient on Neupro 3 mg. Ferritin levels have been low in the past A/P: ***   #Hyperlipidemia/peripheral vascular disease/renal artery  stenosis S: Compliant with atorvastatin 40 mg-->rosuvastatin 40mg  with LDL above 70 in early 2022 and also on eliquis (prior plavix/asa prior to a fib). She has known atherosclerotic peripheral vascular disease with intermittent claudication-stable with claudication at approximately 200 feet.  Lab Results  Component Value Date   CHOL 169 12/04/2020   HDL 80.50 12/04/2020   LDLCALC 78 12/04/2020   LDLDIRECT 60.0 10/09/2021   TRIG 54.0 12/04/2020   CHOLHDL 2 12/04/2020   A/P: ***  #Insulin resistance/hyperglycemia S: prior metformin 500 milligrams daily- diarrhea and low b12 Exercise and diet- *** Lab Results  Component Value Date   HGBA1C 6.4 07/17/2021   HGBA1C 6.2 12/04/2020   HGBA1C 6.1 (H) 05/29/2020    A/P: ***  #COPD S: Finds albuterol helpful starting 2020. She had improvement in shortness of breath when she quit smoking.  A/P: ***    Recommended follow up: No follow-ups on file. Future Appointments  Date Time Provider [place]  02/07/2022 11:30 AM [facility] P[facility]  02/12/2022  9:20 AM Marin Olp, MD [facility] PEC  02/28/2022  8:15 AM Hayden Pedro, MD [facility]TRE None  08/08/2022  9:30 AM [facility] HEALTH COACH [facility] PEC  10/18/2022  8:30 AM Narda Amber K, DO [facility] None    Lab/Order associations: No diagnosis found.  No orders of the defined types were placed in this encounter.   I,Jada Bradford,acting as a scribe for Garret Reddish, MD.,have documented all relevant documentation on the behalf of Garret Reddish, MD,as directed by  Garret Reddish, MD while in the presence of Garret Reddish, MD.  *** Return precautions advised.  Burnett Corrente

## 2022-02-07 ENCOUNTER — Ambulatory Visit (INDEPENDENT_AMBULATORY_CARE_PROVIDER_SITE_OTHER): Payer: Medicare Other | Admitting: *Deleted

## 2022-02-07 DIAGNOSIS — J449 Chronic obstructive pulmonary disease, unspecified: Secondary | ICD-10-CM

## 2022-02-07 DIAGNOSIS — I5032 Chronic diastolic (congestive) heart failure: Secondary | ICD-10-CM

## 2022-02-07 DIAGNOSIS — W19XXXA Unspecified fall, initial encounter: Secondary | ICD-10-CM

## 2022-02-07 NOTE — Patient Instructions (Addendum)
Visit Information ? ?Thank you for taking time to visit with me today. Please don't hesitate to contact me if I can be of assistance to you before our next scheduled telephone appointment. ? ?Following are the goals we discussed today:  ?Take medications as prescribed   ?Attend all scheduled provider appointments ?Perform all self care activities independently  ?call office if I gain more than 2 pounds in one day or 5 pounds in one week ?weigh myself daily ?bring diary to all appointments ?develop a rescue plan ?follow rescue plan if symptoms flare-up ?know when to call the doctor based on weight and symptoms ?track symptoms and what helps feel better or worse ?Obtain no skid socks ?Use walker with all ambulation ? ?Our next appointment is by telephone on 23-Mar-2023 at 1000 ? ?Please call the care guide team at [phone] if you need to cancel or reschedule your appointment.  ? ?If you are experiencing a Mental Health or Behavioral Health Crisis or need someone to talk to, please call the Suicide and Crisis Lifeline: 988 ?call the [place] National Suicide Prevention Lifeline: 8075001720 or TTY: (339)375-8325 TTY [phone]) to talk to a trained counselor ?call 1-800-273-TALK (toll free, 24 hour hotline) ?go to [facility] [address] [place] [phone]) ?call 911  ? ?The patient verbalized understanding of instructions, educational materials, and care plan provided today and declined offer to receive copy of patient instructions, educational materials, and care plan.  ? ?Rhae Lerner RN, MSN ?RN Care Management Coordinator  ?[facility] ?[phone] ?[email or web address] ?  ?

## 2022-02-07 NOTE — Chronic Care Management (AMB) (Signed)
Chronic Care Management   CCM RN Visit Note  02/07/2022 Name: KAMARIONA DRECHSLER MRN: [id number] DOB: December 26, 1930  Subjective: AIYLA PERDUE is a 88 y.o. year old female who is a primary care patient of Yong Channel, Brayton Mars, MD. The care management team was consulted for assistance with disease management and care coordination needs.    Engaged with patient by telephone for follow up visit in response to provider referral for case management and/or care coordination services.   Consent to Services:  The patient was given information about Chronic Care Management services, agreed to services, and gave verbal consent prior to initiation of services.  Please see initial visit note for detailed documentation.   Patient agreed to services and verbal consent obtained.   Assessment: Review of patient past medical history, allergies, medications, health status, including review of consultants reports, laboratory and other test data, was performed as part of comprehensive evaluation and provision of chronic care management services.   SDOH (Social Determinants of Health) assessments and interventions performed:    CCM Care Plan  Allergies  Allergen Reactions   Codeine     hallucinations    Outpatient Encounter Medications as of 02/07/2022  Medication Sig Note   furosemide (LASIX) 40 MG tablet TAKE 1 TABLET TWICE A DAY    Albuterol Sulfate (PROAIR RESPICLICK) 123XX123 (90 Base) MCG/ACT AEPB Inhale 2 puffs into the lungs every 6 (six) hours as needed (shortness of breath from COPD).    apixaban (ELIQUIS) 2.5 MG TABS tablet Take 1 tablet (2.5 mg total) by mouth 2 (two) times daily.    CALCIUM PO Take by mouth.    carbidopa-levodopa (SINEMET IR) 25-100 MG tablet Take daily as needed for worsening RLS.    Cholecalciferol (VITAMIN D3 PO) Take by mouth.    ferrous sulfate 325 (65 FE) MG tablet Take 325 mg by mouth 2 (two) times a week. Patient takes one tablet on Monday and Friday    irbesartan (AVAPRO) 300  MG tablet Take 1 tablet (300 mg total) by mouth daily. 08/30/2021: Last filled 7.2.22 for 90 days supply   LINZESS 145 MCG CAPS capsule Take 145 mcg by mouth daily as needed (constipation).    Multiple Vitamins-Minerals (PRESERVISION AREDS 2 PO) Take 1 tablet by mouth 2 (two) times daily.    polyethylene glycol (MIRALAX / GLYCOLAX) 17 g packet Take 17 g by mouth 2 (two) times daily. Reported on 03/14/2016 (Patient taking differently: Take 17 g by mouth daily as needed for mild constipation.)    potassium chloride SA (KLOR-CON) 10 MEQ tablet Take 1 tablet (10 mEq total) by mouth 2 (two) times daily as needed. Needs to be taken  With each dose of lasix    rosuvastatin (CRESTOR) 40 MG tablet TAKE 1 TABLET DAILY    Rotigotine (NEUPRO) 3 MG/24HR PT24 PLACE 1 PATCH (3 MG) ONTO THE SKIN AT BEDTIME (Patient taking differently: Place 3 mg onto the skin at bedtime.) 08/30/2021: Last filled 7.2.22 for 90 days supply   traMADol (ULTRAM) 50 MG tablet Take 1 tablet (50 mg total) by mouth every 6 (six) hours as needed.    traZODone (DESYREL) 50 MG tablet TAKE ONE AND ONE-HALF TABLETS AT BEDTIME AS NEEDED FOR SLEEP    vitamin B-12 (CYANOCOBALAMIN) 1000 MCG tablet Take 1,000 mcg by mouth daily.    No facility-administered encounter medications on file as of 02/07/2022.    Patient Active Problem List   Diagnosis Date Noted   COVID-19 08/30/2021   COVID-19 virus  infection 08/29/2021   Renal lesion 08/29/2021   B12 deficiency 02/27/2021   Pleural effusion due to CHF (congestive heart failure) ([place]) 05/26/2020   S/P laparoscopic cholecystectomy 04/04/2020   Heart failure with preserved ejection fraction ([place]) 03/01/2020   Insomnia 03/01/2020   Bacteremia 01/28/2020   Atrial fibrillation ([place]) 01/25/2020   Abdominal aortic ectasia ([place]) 12/29/2019   Major depression 03/25/2018   Hypokalemia 12/21/2016   Cat bite of right hand 12/21/2016   BPPV (benign paroxysmal positional vertigo) 07/12/2016   Memory loss  04/11/2016   Mallet toe of right foot 10/20/2015   Renal artery stenosis ([place]) 09/27/2015   Hyperlipidemia 06/30/2015   Claudication ([place]) 05/04/2015   Atherosclerotic PVD with intermittent claudication ([place]) 04/26/2015   Tinnitus 12/31/2014   Former smoker 09/29/2014   DNR (do not resuscitate) 09/29/2014   Hyperglycemia 07/19/2014   Multinodular goiter 08/30/2013   Spinal stenosis of lumbar region at multiple levels 09/29/2012   HIP PAIN, BILATERAL 07/16/2010   COPD (chronic obstructive pulmonary disease) ([place]) 09/20/2009   CONSTIPATION, CHRONIC 09/20/2009   Iron deficiency anemia 11/09/2007   History of UTI 11/09/2007   RESTLESS LEG SYNDROME 09/25/2007   Essential hypertension 09/25/2007   GERD 09/25/2007   LOW BACK PAIN 09/25/2007   Osteoporosis 09/25/2007    Conditions to be addressed/monitored:CHF and FALLS  Care Plan : [place] (Adult)  Updates made by Leona Singleton, RN since 02/07/2022 12:00 AM     Problem: Inability to care for self based on knowledge deficit   Priority: High     Long-Range Goal: Patient will work with CCM team to gain knowledge of self care management of chronic conditions   Start Date: 10/23/2021  Expected End Date: 10/23/2022  Recent Progress: Not on track  Priority: High  Note:   Current Barriers   Knowledge Deficits related to plan of care for management of CHF and Falls  Chronic Disease Management support and education needs related to CHF and Falls Literacy barriers    12/05/21-Spoke with patient, states she isdoing well. Denies any recent falls.  Ambulating with walker.  Has not weighed but feels weight increased from holiday eating.  Denies any lightheadiness or dizzines or shortness of breath.  States she has minimal chest pain with exertion (does not feel like her heart per patient) that resolves with rest.  Encouraged to contact primary care t discuss and if at any point pain does not go awat to go to emergency room.   Continues to work with home health therapy.  1/26--Patient stating she is doing well.   Denies any recent falls.  Denies any increase in SOB; Reports using rescue inhaler about 3 times in the past few week..  Denies any chest pains, stating they resolved on their own.  Weight remains 140 (states she wants to loose down to 135).  Denies any issues or concerns  3/9--patient stating she is fair.  Reports increasing sob while walking down the hall; has appointment with pcp next week.  Encouraged to contact provider for worsening sob.  Denies any chest pains.  Denies any recent falls.  Has not been weighing self daily                                         RNCM Clinical Goal(s):    Patient will verbalize understanding of plan for management of CHF and  Falls as evidenced by daily weight monitoring verbalize basic understanding of CHF and falls disease process and self health management plan as evidenced by monitoring weights daily and no hospital readmissions take all medications exactly as prescribed and will call provider for medication related questions as evidenced by medication compliance    demonstrate improved health management independence as evidenced by no hospital admissions or falls in the next 90days        through collaboration with RN Care manager, provider, and care team.   Interventions: 1:1 collaboration with primary care provider regarding development and update of comprehensive plan of care as evidenced by provider attestation and co-signature Inter-disciplinary care team collaboration (see longitudinal plan of care) Evaluation of current treatment plan related to  self management and patient's adherence to plan as established by provider   Heart Failure Interventions:  (Status: Goal on track: NO.)  Long Term Goal  Basic overview and discussion of pathophysiology of Heart Failure reviewed Provided education on low sodium diet Reviewed Heart Failure Action Plan in depth and  provided written copy Advised patient to weigh each morning after emptying bladder Discussed importance of daily weight and advised patient to weigh and record daily Reviewed role of diuretics in prevention of fluid overload and management of heart failure Discussed the importance of keeping all appointments with provider Provided patient with education about the role of exercise in the management of heart failure Rediscussed purpose and reasoning of weighing daily and encouraged patient to do so Discussed when to call provider based on weights  Falls:  (Status: Goal on Track (progressing): YES.) Long Term Goal  Provided written and verbal education re: potential causes of falls and Fall prevention strategies Reviewed medications and discussed potential side effects of medications such as dizziness and frequent urination Advised patient of importance of notifying provider of falls Assessed for signs and symptoms of orthostatic hypotension Assessed for falls since last encounter Assessed patients knowledge of fall risk prevention secondary to previously provided education Fall precautions and preventions reviewed and discussed Instructed to use walker with all ambulation Encouraged to attend scheduled appointments  Patient Goals/Self-Care Activities: Take medications as prescribed   Attend all scheduled provider appointments Perform all self care activities independently  call office if I gain more than 2 pounds in one day or 5 pounds in one week weigh myself daily bring diary to all appointments develop a rescue plan follow rescue plan if symptoms flare-up know when to call the doctor based on weight and symptoms track symptoms and what helps feel better or worse Obtain no skid socks Use walker with all ambulation       Plan:The care management team will reach out to the patient again over the next 45 days.  Hubert Azure RN, MSN RN Care Management Coordinator  [facility] [phone] [email or web address]

## 2022-02-12 ENCOUNTER — Ambulatory Visit (INDEPENDENT_AMBULATORY_CARE_PROVIDER_SITE_OTHER): Payer: Medicare Other | Admitting: Family Medicine

## 2022-02-12 ENCOUNTER — Encounter: Payer: Self-pay | Admitting: Family Medicine

## 2022-02-12 VITALS — BP 138/94 | HR 81 | Temp 97.2°F | Ht 66.0 in | Wt 147.2 lb

## 2022-02-12 DIAGNOSIS — R739 Hyperglycemia, unspecified: Secondary | ICD-10-CM | POA: Diagnosis not present

## 2022-02-12 DIAGNOSIS — J449 Chronic obstructive pulmonary disease, unspecified: Secondary | ICD-10-CM

## 2022-02-12 DIAGNOSIS — E785 Hyperlipidemia, unspecified: Secondary | ICD-10-CM | POA: Diagnosis not present

## 2022-02-12 DIAGNOSIS — I5032 Chronic diastolic (congestive) heart failure: Secondary | ICD-10-CM | POA: Diagnosis not present

## 2022-02-12 DIAGNOSIS — I10 Essential (primary) hypertension: Secondary | ICD-10-CM

## 2022-02-12 DIAGNOSIS — I701 Atherosclerosis of renal artery: Secondary | ICD-10-CM

## 2022-02-12 LAB — LIPID PANEL
Cholesterol: 163 mg/dL (ref 0–200)
HDL: 86.7 mg/dL (ref >39.00)
LDL Cholesterol: 68 mg/dL (ref 0–99)
NonHDL: 76.69
Total CHOL/HDL Ratio: 2
Triglycerides: 45 mg/dL (ref 0.0–149.0)
VLDL: 9 mg/dL (ref 0.0–40.0)

## 2022-02-12 LAB — COMPREHENSIVE METABOLIC PANEL
ALT: 12 U/L (ref 0–35)
AST: 16 U/L (ref 0–37)
Albumin: 3.8 g/dL (ref 3.5–5.2)
Alkaline Phosphatase: 94 U/L (ref 39–117)
BUN: 18 mg/dL (ref 6–23)
CO2: 28 mEq/L (ref 19–32)
Calcium: 9.4 mg/dL (ref 8.4–10.5)
Chloride: 104 mEq/L (ref 96–112)
Creatinine, Ser: 0.85 mg/dL (ref 0.40–1.20)
GFR: 61.15 mL/min (ref >60.00)
Glucose, Bld: 96 mg/dL (ref 70–99)
Potassium: 3.7 mEq/L (ref 3.5–5.1)
Sodium: 140 mEq/L (ref 135–145)
Total Bilirubin: 0.6 mg/dL (ref 0.2–1.2)
Total Protein: 7 g/dL (ref 6.0–8.3)

## 2022-02-12 LAB — CBC WITH DIFFERENTIAL/PLATELET
Basophils Absolute: 0.1 10*3/uL (ref 0.0–0.1)
Basophils Relative: 0.9 % (ref 0.0–3.0)
Eosinophils Absolute: 0.2 10*3/uL (ref 0.0–0.7)
Eosinophils Relative: 2.3 % (ref 0.0–5.0)
HCT: 33.3 % — ABNORMAL LOW (ref 36.0–46.0)
Hemoglobin: 11 g/dL — ABNORMAL LOW (ref 12.0–15.0)
Lymphocytes Relative: 18.1 % (ref 12.0–46.0)
Lymphs Abs: 1.4 10*3/uL (ref 0.7–4.0)
MCHC: 33 g/dL (ref 30.0–36.0)
MCV: 85.9 fl (ref 78.0–100.0)
Monocytes Absolute: 0.7 10*3/uL (ref 0.1–1.0)
Monocytes Relative: 9.1 % (ref 3.0–12.0)
Neutro Abs: 5.4 10*3/uL (ref 1.4–7.7)
Neutrophils Relative %: 69.6 % (ref 43.0–77.0)
Platelets: 215 10*3/uL (ref 150.0–400.0)
RBC: 3.87 Mil/uL (ref 3.87–5.11)
RDW: 15.8 % — ABNORMAL HIGH (ref 11.5–15.5)
WBC: 7.7 10*3/uL (ref 4.0–10.5)

## 2022-02-12 LAB — HEMOGLOBIN A1C: Hgb A1c MFr Bld: 6.4 % (ref 4.6–6.5)

## 2022-02-12 NOTE — Patient Instructions (Addendum)
Going to have her take lasix twice a day every other day for next 2 weeks ? ?Tuesday- lasix in AM and PM ?Wednesday lasix AM ?Thursday- lasix AM and PM ?Friday- lasix AM ?Saturday lasix AM and PM ?Etc.  ? ?Let me know if shortness of breath doesn't improve or weight doesn't trend back down. Take an extra potassium on the day you take 2 lasix. Can go back to once a day after that but if weight goes up or SOB goes up let me know ? ?Please stop by lab before you go ?If you have mychart- we will send your results within 3 business days of [place] receiving them.  ?If you do not have mychart- we will call you about results within 5 business days of [place] receiving them.  ?*please also note that you will see labs on mychart as soon as they post. I will later go in and write notes on them- will say "notes from Dr. Durene Cal"  ? ?Recommended follow up: Return in about 2 months (around 04/14/2022) for follow up- or sooner if needed.  ?

## 2022-02-26 ENCOUNTER — Ambulatory Visit: Payer: Medicare Other | Admitting: *Deleted

## 2022-02-26 DIAGNOSIS — I5032 Chronic diastolic (congestive) heart failure: Secondary | ICD-10-CM

## 2022-02-26 DIAGNOSIS — I10 Essential (primary) hypertension: Secondary | ICD-10-CM

## 2022-02-26 NOTE — Patient Instructions (Addendum)
Visit Information ? ?Thank you for taking time to visit with me today. Please don't hesitate to contact me if I can be of assistance to you before our next scheduled telephone appointment. ? ?Following are the goals we discussed today:  ?Take medications as prescribed   ?Attend all scheduled provider appointments ?Perform all self care activities independently  ?call office if I gain more than 2 pounds in one day or 5 pounds in one week ?weigh myself daily ?bring diary to all appointments ?develop a rescue plan ?follow rescue plan if symptoms flare-up ?know when to call the doctor based on weight and symptoms ?track symptoms and what helps feel better or worse ?Obtain no skid socks ?Use walker with all ambulation ? ?Our next appointment is by telephone on 4/20 at 1045 ? ?Please call the care guide team at [phone] if you need to cancel or reschedule your appointment.  ? ?If you are experiencing a Mental Health or Behavioral Health Crisis or need someone to talk to, please call the Suicide and Crisis Lifeline: 988 ?call the [place] National Suicide Prevention Lifeline: 848-179-4617 or TTY: (754)701-4534 TTY (252) 028-1016) to talk to a trained counselor ?call 1-800-273-TALK (toll free, 24 hour hotline) ?go to [facility] [address] [place] [phone]) ?call 911  ? ?The patient verbalized understanding of instructions, educational materials, and care plan provided today and declined offer to receive copy of patient instructions, educational materials, and care plan.  ? ?Rhae Lerner RN, MSN ?RN Care Management Coordinator  ?[facility] ?[phone] ?[email or web address] ? ?

## 2022-02-26 NOTE — Chronic Care Management (AMB) (Signed)
?Chronic Care Management  ? ?CCM RN Visit Note ? ?02/26/2022 ?Name: Cheryl Atkinson MRN: [id number] DOB: [birth year] ? ?Subjective: ?Cheryl Atkinson is a 88 y.o. year old female who is a primary care patient of Shelva Majestic, MD. The care management team was consulted for assistance with disease management and care coordination needs.   ? ?Engaged with patient by telephone for follow up visit in response to provider referral for case management and/or care coordination services.  ? ?Consent to Services:  ?The patient was given information about Chronic Care Management services, agreed to services, and gave verbal consent prior to initiation of services.  Please see initial visit note for detailed documentation.  ? ?Patient agreed to services and verbal consent obtained.  ? ?Assessment: Review of patient past medical history, allergies, medications, health status, including review of consultants reports, laboratory and other test data, was performed as part of comprehensive evaluation and provision of chronic care management services.  ? ?SDOH (Social Determinants of Health) assessments and interventions performed:   ? ?CCM Care Plan ? ?Allergies  ?Allergen Reactions  ? Codeine   ?  hallucinations  ? ? ?Outpatient Encounter Medications as of 02/26/2022  ?Medication Sig Note  ? Albuterol Sulfate (PROAIR RESPICLICK) 108 (90 Base) MCG/ACT AEPB Inhale 2 puffs into the lungs every 6 (six) hours as needed (shortness of breath from COPD).   ? apixaban (ELIQUIS) 2.5 MG TABS tablet Take 1 tablet (2.5 mg total) by mouth 2 (two) times daily.   ? CALCIUM PO Take by mouth.   ? carbidopa-levodopa (SINEMET IR) 25-100 MG tablet Take daily as needed for worsening RLS.   ? Cholecalciferol (VITAMIN D3 PO) Take by mouth.   ? ferrous sulfate 325 (65 FE) MG tablet Take 325 mg by mouth 2 (two) times a week. Patient takes one tablet on Monday and Friday   ? furosemide (LASIX) 40 MG tablet TAKE 1 TABLET TWICE A DAY   ? irbesartan (AVAPRO)  300 MG tablet Take 1 tablet (300 mg total) by mouth daily. 08/30/2021: Last filled 7.2.22 for 90 days supply  ? LINZESS 145 MCG CAPS capsule Take 145 mcg by mouth daily as needed (constipation).   ? Multiple Vitamins-Minerals (PRESERVISION AREDS 2 PO) Take 1 tablet by mouth 2 (two) times daily.   ? polyethylene glycol (MIRALAX / GLYCOLAX) 17 g packet Take 17 g by mouth 2 (two) times daily. Reported on 03/14/2016 (Patient taking differently: Take 17 g by mouth daily as needed for mild constipation.)   ? potassium chloride SA (KLOR-CON) 10 MEQ tablet Take 1 tablet (10 mEq total) by mouth 2 (two) times daily as needed. Needs to be taken  With each dose of lasix   ? rosuvastatin (CRESTOR) 40 MG tablet TAKE 1 TABLET DAILY   ? Rotigotine (NEUPRO) 3 MG/24HR PT24 PLACE 1 PATCH (3 MG) ONTO THE SKIN AT BEDTIME (Patient taking differently: Place 3 mg onto the skin at bedtime.) 08/30/2021: Last filled 7.2.22 for 90 days supply  ? traMADol (ULTRAM) 50 MG tablet Take 1 tablet (50 mg total) by mouth every 6 (six) hours as needed.   ? traZODone (DESYREL) 50 MG tablet TAKE ONE AND ONE-HALF TABLETS AT BEDTIME AS NEEDED FOR SLEEP   ? vitamin B-12 (CYANOCOBALAMIN) 1000 MCG tablet Take 1,000 mcg by mouth daily.   ? ?No facility-administered encounter medications on file as of 02/26/2022.  ? ? ?Patient Active Problem List  ? Diagnosis Date Noted  ? COVID-19 08/30/2021  ? COVID-19 virus  infection 08/29/2021  ? Renal lesion 08/29/2021  ? B12 deficiency 02/27/2021  ? Pleural effusion due to CHF (congestive heart failure) (H[facility]) 05/26/2020  ? S/P laparoscopic cholecystectomy 04/04/2020  ? Heart failure with preserved ejection fraction (H[facility]) 03/01/2020  ? Insomnia 03/01/2020  ? Bacteremia 01/28/2020  ? Atrial fibrillation ([facility]) 01/25/2020  ? Abdominal aortic ectasia ([facility]) 12/29/2019  ? Major depression 03/25/2018  ? Hypokalemia 12/21/2016  ? Cat bite of right hand 12/21/2016  ? BPPV (benign paroxysmal positional vertigo) 07/12/2016  ? Memory loss  04/11/2016  ? Mallet toe of right foot 10/20/2015  ? Renal artery stenosis (HCC) 09/27/2015  ? Hyperlipidemia 06/30/2015  ? Claudication (HCC) 05/04/2015  ? Atherosclerotic PVD with intermittent claudication ([facility]) 04/26/2015  ? Tinnitus 12/31/2014  ? Former smoker 09/29/2014  ? DNR (do not resuscitate) 09/29/2014  ? Hyperglycemia 07/19/2014  ? Multinodular goiter 08/30/2013  ? Spinal stenosis of lumbar region at multiple levels 09/29/2012  ? HIP PAIN, BILATERAL 07/16/2010  ? COPD (chronic obstructive pulmonary disease) (HCC) 09/20/2009  ? CONSTIPATION, CHRONIC 09/20/2009  ? Iron deficiency anemia 11/09/2007  ? History of UTI 11/09/2007  ? RESTLESS LEG SYNDROME 09/25/2007  ? Essential hypertension 09/25/2007  ? GERD 09/25/2007  ? LOW BACK PAIN 09/25/2007  ? Osteoporosis 09/25/2007  ? ? ?Conditions to be addressed/monitored:CHF and HTN ? ?Care Plan : [facility]  General Plan of Care (Adult)  ?Updates made by Maple Mirza, RN since 02/26/2022 12:00 AM  ?  ? ?Problem: Inability to care for self based on knowledge deficit   ?Priority: High  ?  ? ?Long-Range Goal: Patient will work with CCM team to gain knowledge of self care management of chronic conditions   ?Start Date: 10/23/2021  ?Expected End Date: 10/23/2022  ?Recent Progress: Not on track  ?Priority: High  ?Note:   ?Current Barriers   ?Knowledge Deficits related to plan of care for management of CHF and Falls  ?Chronic Disease Management support and education needs related to CHF and Falls ?Literacy barriers    12/05/21-Spoke with patient, states she isdoing well. Denies any recent falls.  Ambulating with walker.  Has not weighed but feels weight increased from holiday eating.  Denies any lightheadiness or dizzines or shortness of breath.  States she has minimal chest pain with exertion (does not feel like her heart per patient) that resolves with rest.  Encouraged to contact primary care t discuss and if at any point pain does not go awat to go to emergency room.   Continues to work with home health therapy. ? ?1/26--Patient stating she is doing well.   Denies any recent falls.  Denies any increase in SOB; Reports using rescue inhaler about 3 times in the past few week..  Denies any chest pains, stating they resolved on their own.  Weight remains 140 (states she wants to loose down to 135).  Denies any issues or concerns ? ?3/9--patient stating she is fair.  Reports increasing sob while walking down the hall; has appointment with pcp next week.  Encouraged to contact provider for worsening sob.  Denies any chest pains.  Denies any recent falls.  Has not been weighing self daily ? ?3/28--reports not feeling well, "has a cold" but getting better, per patient. Continues to have sore throat and cough; only taking cough drops for treatment.  SOB is better after increase in fluid medication for the last 2 weeks per pcp instruction.  Continues to not weigh herself or check her blood pressures at home. ? ?  RNCM Clinical Goal(s):    ?Patient will verbalize understanding of plan for management of CHF and Falls as evidenced by daily weight monitoring ?verbalize basic understanding of CHF and falls disease process and self health management plan as evidenced by monitoring weights daily and no hospital readmissions ?take all medications exactly as prescribed and will call provider for medication related questions as evidenced by medication compliance    ?demonstrate improved health management independence as evidenced by no hospital admissions or falls in the next 90days        through collaboration with RN Care manager, provider, and care team.  ? ?Interventions: ?1:1 collaboration with primary care provider regarding development and update of comprehensive plan of care as evidenced by provider attestation and co-signature ?Inter-disciplinary care team collaboration (see longitudinal plan of care) ?Evaluation of current treatment plan related to  self management and patient's adherence to  plan as established by provider ? ? ?Heart Failure Interventions:  (Status: Goal on track: NO.)  Long Term Goal  ?Basic overview and discussion of pathophysiology of Heart Failure reviewed ?Provided educa

## 2022-02-27 ENCOUNTER — Telehealth: Payer: Self-pay | Admitting: Family Medicine

## 2022-02-27 NOTE — Telephone Encounter (Signed)
Patient stated her wheelchair broke.- stated she called her insurance and they are requesting a prescription/authorization to be sent to insurance.  ?

## 2022-02-27 NOTE — Telephone Encounter (Signed)
Called and spoke with pt and made aware I will fax an order to [facility] for her wheelchair. Order has been faxed. ?

## 2022-02-28 ENCOUNTER — Encounter (INDEPENDENT_AMBULATORY_CARE_PROVIDER_SITE_OTHER): Payer: Medicare Other | Admitting: Ophthalmology

## 2022-02-28 DIAGNOSIS — H353221 Exudative age-related macular degeneration, left eye, with active choroidal neovascularization: Secondary | ICD-10-CM

## 2022-02-28 DIAGNOSIS — H43813 Vitreous degeneration, bilateral: Secondary | ICD-10-CM

## 2022-02-28 DIAGNOSIS — H353112 Nonexudative age-related macular degeneration, right eye, intermediate dry stage: Secondary | ICD-10-CM

## 2022-02-28 DIAGNOSIS — H35033 Hypertensive retinopathy, bilateral: Secondary | ICD-10-CM

## 2022-02-28 DIAGNOSIS — I10 Essential (primary) hypertension: Secondary | ICD-10-CM

## 2022-03-01 ENCOUNTER — Other Ambulatory Visit: Payer: Self-pay

## 2022-03-01 DIAGNOSIS — W19XXXA Unspecified fall, initial encounter: Secondary | ICD-10-CM

## 2022-03-01 DIAGNOSIS — R531 Weakness: Secondary | ICD-10-CM

## 2022-03-01 DIAGNOSIS — G2581 Restless legs syndrome: Secondary | ICD-10-CM

## 2022-03-01 NOTE — Telephone Encounter (Signed)
Called and lm with Cheryl Atkinson for [employer]. ?

## 2022-03-01 NOTE — Telephone Encounter (Signed)
Pt's caregiver, Gavin Pound, states she spoke with [facility] and they are stating they have not received an order. Please advise ?

## 2022-03-01 NOTE — Telephone Encounter (Signed)
Patient called back spoke to [facility] and they stated we have to send prescription for a "transporter" not a wheelchair so that insurance can pay for it.  ?

## 2022-03-04 ENCOUNTER — Telehealth: Payer: Self-pay

## 2022-03-04 NOTE — Telephone Encounter (Signed)
Pts wheelchair has broken and she is needing a new one. I called and spoke with [facility] and they state that they need the most recent office note with documentation saying "pt will have someone to push her at all times in the transport chair". ?

## 2022-03-04 NOTE — Telephone Encounter (Signed)
Steward Drone called and stated Cheryl Atkinson has not received anything. She is asking for you to leave it up front and she will pick it up this afternoon. ?

## 2022-03-04 NOTE — Telephone Encounter (Signed)
Awaiting Dr. Durene Cal to addend the note and I will place everything upfront.  ?

## 2022-03-04 NOTE — Telephone Encounter (Signed)
Called and spoke with pt sister Cheryl Atkinson and made aware I have faxed the Rx for the transport chair Friday and again this morning. I made her aware that I will also place this up front for them to pick up if they want to take to [facility] themselves which may work better. ?

## 2022-03-04 NOTE — Telephone Encounter (Signed)
I have updated the note

## 2022-03-04 NOTE — Telephone Encounter (Signed)
Please follow back up in regard with patient.

## 2022-03-05 ENCOUNTER — Telehealth: Payer: Self-pay | Admitting: Family Medicine

## 2022-03-05 NOTE — Telephone Encounter (Signed)
Note printed and rx placed inside and placed up front,  pt made aware.  ?

## 2022-03-05 NOTE — Telephone Encounter (Signed)
Please call Stanton Kidney or Enis Gash with [place] at [phone] ?

## 2022-03-05 NOTE — Telephone Encounter (Signed)
Called and spoke with Cheryl Atkinson, Rx refaxed to [phone]. ?

## 2022-03-21 ENCOUNTER — Ambulatory Visit (INDEPENDENT_AMBULATORY_CARE_PROVIDER_SITE_OTHER): Payer: Medicare Other | Admitting: *Deleted

## 2022-03-21 DIAGNOSIS — I5032 Chronic diastolic (congestive) heart failure: Secondary | ICD-10-CM

## 2022-03-21 DIAGNOSIS — J449 Chronic obstructive pulmonary disease, unspecified: Secondary | ICD-10-CM

## 2022-03-21 NOTE — Chronic Care Management (AMB) (Signed)
?Chronic Care Management  ? ?CCM RN Visit Note ? ?03/21/2022 ?Name: Cheryl Atkinson MRN: [id number] DOB: Sep 26, 1931 ? ?Subjective: ?Cheryl Atkinson is a 88 y.o. year old female who is a primary care patient of Shelva Majestic, MD. The care management team was consulted for assistance with disease management and care coordination needs.   ? ?Engaged with patient by telephone for follow up visit in response to provider referral for case management and/or care coordination services.  ? ?Consent to Services:  ?The patient was given information about Chronic Care Management services, agreed to services, and gave verbal consent prior to initiation of services.  Please see initial visit note for detailed documentation.  ? ?Patient agreed to services and verbal consent obtained.  ? ?Assessment: Review of patient past medical history, allergies, medications, health status, including review of consultants reports, laboratory and other test data, was performed as part of comprehensive evaluation and provision of chronic care management services.  ? ?SDOH (Social Determinants of Health) assessments and interventions performed:   ? ?CCM Care Plan ? ?Allergies  ?Allergen Reactions  ? Codeine   ?  hallucinations  ? ? ?Outpatient Encounter Medications as of 03/21/2022  ?Medication Sig Note  ? Albuterol Sulfate (PROAIR RESPICLICK) 108 (90 Base) MCG/ACT AEPB Inhale 2 puffs into the lungs every 6 (six) hours as needed (shortness of breath from COPD).   ? apixaban (ELIQUIS) 2.5 MG TABS tablet Take 1 tablet (2.5 mg total) by mouth 2 (two) times daily.   ? CALCIUM PO Take by mouth.   ? carbidopa-levodopa (SINEMET IR) 25-100 MG tablet Take daily as needed for worsening RLS.   ? Cholecalciferol (VITAMIN D3 PO) Take by mouth.   ? ferrous sulfate 325 (65 FE) MG tablet Take 325 mg by mouth 2 (two) times a week. Patient takes one tablet on Monday and Friday   ? furosemide (LASIX) 40 MG tablet TAKE 1 TABLET TWICE A DAY   ? irbesartan (AVAPRO)  300 MG tablet Take 1 tablet (300 mg total) by mouth daily. 08/30/2021: Last filled 7.2.22 for 90 days supply  ? LINZESS 145 MCG CAPS capsule Take 145 mcg by mouth daily as needed (constipation).   ? Multiple Vitamins-Minerals (PRESERVISION AREDS 2 PO) Take 1 tablet by mouth 2 (two) times daily.   ? polyethylene glycol (MIRALAX / GLYCOLAX) 17 g packet Take 17 g by mouth 2 (two) times daily. Reported on 03/14/2016 (Patient taking differently: Take 17 g by mouth daily as needed for mild constipation.)   ? potassium chloride SA (KLOR-CON) 10 MEQ tablet Take 1 tablet (10 mEq total) by mouth 2 (two) times daily as needed. Needs to be taken  With each dose of lasix   ? rosuvastatin (CRESTOR) 40 MG tablet TAKE 1 TABLET DAILY   ? Rotigotine (NEUPRO) 3 MG/24HR PT24 PLACE 1 PATCH (3 MG) ONTO THE SKIN AT BEDTIME (Patient taking differently: Place 3 mg onto the skin at bedtime.) 08/30/2021: Last filled 7.2.22 for 90 days supply  ? traMADol (ULTRAM) 50 MG tablet Take 1 tablet (50 mg total) by mouth every 6 (six) hours as needed.   ? traZODone (DESYREL) 50 MG tablet TAKE ONE AND ONE-HALF TABLETS AT BEDTIME AS NEEDED FOR SLEEP   ? vitamin B-12 (CYANOCOBALAMIN) 1000 MCG tablet Take 1,000 mcg by mouth daily.   ? ?No facility-administered encounter medications on file as of 03/21/2022.  ? ? ?Patient Active Problem List  ? Diagnosis Date Noted  ? COVID-19 08/30/2021  ? COVID-19 virus  infection 08/29/2021  ? Renal lesion 08/29/2021  ? B12 deficiency 02/27/2021  ? Pleural effusion due to CHF (congestive heart failure) (HCC) 05/26/2020  ? S/P laparoscopic cholecystectomy 04/04/2020  ? Heart failure with preserved ejection fraction (HCC) 03/01/2020  ? Insomnia 03/01/2020  ? Bacteremia 01/28/2020  ? Atrial fibrillation (HCC) 01/25/2020  ? Abdominal aortic ectasia (HCC) 12/29/2019  ? Major depression 03/25/2018  ? Hypokalemia 12/21/2016  ? Cat bite of right hand 12/21/2016  ? BPPV (benign paroxysmal positional vertigo) 07/12/2016  ? Memory loss  04/11/2016  ? Mallet toe of right foot 10/20/2015  ? Renal artery stenosis (HCC) 09/27/2015  ? Hyperlipidemia 06/30/2015  ? Claudication (HCC) 05/04/2015  ? Atherosclerotic PVD with intermittent claudication (HCC) 04/26/2015  ? Tinnitus 12/31/2014  ? Former smoker 09/29/2014  ? DNR (do not resuscitate) 09/29/2014  ? Hyperglycemia 07/19/2014  ? Multinodular goiter 08/30/2013  ? Spinal stenosis of lumbar region at multiple levels 09/29/2012  ? HIP PAIN, BILATERAL 07/16/2010  ? COPD (chronic obstructive pulmonary disease) (HCC) 09/20/2009  ? CONSTIPATION, CHRONIC 09/20/2009  ? Iron deficiency anemia 11/09/2007  ? History of UTI 11/09/2007  ? RESTLESS LEG SYNDROME 09/25/2007  ? Essential hypertension 09/25/2007  ? GERD 09/25/2007  ? LOW BACK PAIN 09/25/2007  ? Osteoporosis 09/25/2007  ? ? ?Conditions to be addressed/monitored:CHF and COPD ? ?[facility] Care (Adult)  ?Updates made by Maple Mirzaarpley, Aulton Routt D, RN since 03/21/2022 12:00 AM  ?  ? ?Problem: Inability to care for self based on knowledge deficit   ?Priority: High  ?  ? ?Long-Range Goal: Patient will work with CCM team to gain knowledge of self care management of chronic conditions   ?Start Date: 10/23/2021  ?Expected End Date: 10/23/2022  ?Recent Progress: Not on track  ?Priority: High  ?Note:   ?Current Barriers   ?Knowledge Deficits related to plan of care for management of CHF and Falls  ?Chronic Disease Management support and education needs related to CHF and Falls ?Literacy barriers    12/05/21-Spoke with patient, states she isdoing well. Denies any recent falls.  Ambulating with walker.  Has not weighed but feels weight increased from holiday eating.  Denies any lightheadiness or dizzines or shortness of breath.  States she has minimal chest pain with exertion (does not feel like her heart per patient) that resolves with rest.  Encouraged to contact primary care t discuss and if at any point pain does not go awat to go to emergency room.   Continues to work with home health therapy. ? ?1/26--Patient stating she is doing well.   Denies any recent falls.  Denies any increase in SOB; Reports using rescue inhaler about 3 times in the past few week..  Denies any chest pains, stating they resolved on their own.  Weight remains 140 (states she wants to loose down to 135).  Denies any issues or concerns ? ?3/9--patient stating she is fair.  Reports increasing sob while walking down the hall; has appointment with pcp next week.  Encouraged to contact provider for worsening sob.  Denies any chest pains.  Denies any recent falls.  Has not been weighing self daily ? ?3/28--reports not feeling well, "has a cold" but getting better, per patient. Continues to have sore throat and cough; only taking cough drops for treatment.  SOB is better after increase in fluid medication for the last 2 weeks per pcp instruction.  Continues to not weigh herself or check her blood pressures at home. ? ?  4/20--states she feels better.  Weight yesterday was 144 pounds.  Denies any swelling or SOB.  Reporting using rescue inhaler about 3 times last week.  Patient stating her transport chair brakes are broken.  Has a prescription for new one but DME supply states insurance will not cover due to recent purchase of current transport chair; they are willing to fix the brakes ? ?RNCM Clinical Goal(s):    ?Patient will verbalize understanding of plan for management of CHF and Falls as evidenced by daily weight monitoring ?verbalize basic understanding of CHF and falls disease process and self health management plan as evidenced by monitoring weights daily and no hospital readmissions ?take all medications exactly as prescribed and will call provider for medication related questions as evidenced by medication compliance    ?demonstrate improved health management independence as evidenced by no hospital admissions or falls in the next 90days        through collaboration with RN Care manager,  provider, and care team.  ? ?Interventions: ?1:1 collaboration with primary care provider regarding development and update of comprehensive plan of care as evidenced by provider attestation and co-signatu

## 2022-03-21 NOTE — Patient Instructions (Addendum)
Visit Information ? ?Thank you for taking time to visit with me today. Please don't hesitate to contact me if I can be of assistance to you before our next scheduled telephone appointment. ? ?Following are the goals we discussed today:  ?(Take medications as prescribed   ?Attend all scheduled provider appointments ?Perform all self care activities independently  ?call office if I gain more than 2 pounds in one day or 5 pounds in one week ?weigh myself daily ?bring diary to all appointments ?develop a rescue plan ?follow rescue plan if symptoms flare-up ?know when to call the doctor based on weight and symptoms ?track symptoms and what helps feel better or worse ?Obtain no skid socks ?Use walker with all ambulation ?Take transport chair to get brakes fixed ? ?Our next appointment is by telephone on 5/18 at 1130 ? ?Please call the care guide team at [phone] if you need to cancel or reschedule your appointment.  ? ?If you are experiencing a Mental Health or [place] or need someone to talk to, please call the Suicide and Crisis Lifeline: 988 ?call the [place] National Suicide Prevention Lifeline: 9082183701 or TTY: 239-149-8350 TTY 726-022-9794) to talk to a trained counselor ?call 1-800-273-TALK (toll free, 24 hour hotline) ?call 911  ? ?The patient verbalized understanding of instructions, educational materials, and care plan provided today and declined offer to receive copy of patient instructions, educational materials, and care plan.  ? ?Hubert Azure RN, MSN ?RN Care Management Coordinator  ?[place] ?[phone] ?[email or web address] ? ?

## 2022-03-28 ENCOUNTER — Encounter (INDEPENDENT_AMBULATORY_CARE_PROVIDER_SITE_OTHER): Payer: Medicare Other | Admitting: Ophthalmology

## 2022-03-28 DIAGNOSIS — H353221 Exudative age-related macular degeneration, left eye, with active choroidal neovascularization: Secondary | ICD-10-CM | POA: Diagnosis not present

## 2022-03-28 DIAGNOSIS — H43813 Vitreous degeneration, bilateral: Secondary | ICD-10-CM

## 2022-03-28 DIAGNOSIS — I10 Essential (primary) hypertension: Secondary | ICD-10-CM | POA: Diagnosis not present

## 2022-03-28 DIAGNOSIS — H353112 Nonexudative age-related macular degeneration, right eye, intermediate dry stage: Secondary | ICD-10-CM | POA: Diagnosis not present

## 2022-03-28 DIAGNOSIS — H35033 Hypertensive retinopathy, bilateral: Secondary | ICD-10-CM

## 2022-03-31 DIAGNOSIS — I5032 Chronic diastolic (congestive) heart failure: Secondary | ICD-10-CM

## 2022-03-31 DIAGNOSIS — J449 Chronic obstructive pulmonary disease, unspecified: Secondary | ICD-10-CM

## 2022-04-18 ENCOUNTER — Telehealth: Payer: Self-pay | Admitting: Family Medicine

## 2022-04-18 ENCOUNTER — Ambulatory Visit (INDEPENDENT_AMBULATORY_CARE_PROVIDER_SITE_OTHER): Payer: Medicare Other | Admitting: *Deleted

## 2022-04-18 DIAGNOSIS — J449 Chronic obstructive pulmonary disease, unspecified: Secondary | ICD-10-CM

## 2022-04-18 DIAGNOSIS — R531 Weakness: Secondary | ICD-10-CM

## 2022-04-18 DIAGNOSIS — I10 Essential (primary) hypertension: Secondary | ICD-10-CM

## 2022-04-18 DIAGNOSIS — I5032 Chronic diastolic (congestive) heart failure: Secondary | ICD-10-CM

## 2022-04-18 NOTE — Telephone Encounter (Signed)
Referral has been placed. 

## 2022-04-18 NOTE — Telephone Encounter (Signed)
..  Reason for Referral Request:     Reason: [facility] - Referral  Referral to which Specialty: OT  Preferred office/ provider:   Tawny Hopping, OT [facility] Fax [phone] Office [phone] Fax [phone] Cell [phone]

## 2022-04-18 NOTE — Patient Instructions (Addendum)
Visit Information  Thank you for taking time to visit with me today. Please don't hesitate to contact me if I can be of assistance to you before our next scheduled telephone appointment.  Following are the goals we discussed today:  Take medications as prescribed   Attend all scheduled provider appointments Perform all self care activities independently  call office if I gain more than 2 pounds in one day or 5 pounds in one week weigh myself daily bring diary to all appointments develop a rescue plan follow rescue plan if symptoms flare-up know when to call the doctor based on weight and symptoms track symptoms and what helps feel better or worse Obtain no skid socks Use walker with all ambulation Take transport chair to get brakes fixed  Our next appointment is by telephone on 6/13 at 1000  Please call the care guide team at [phone] if you need to cancel or reschedule your appointment.   If you are experiencing a Mental Health or [place] or need someone to talk to, please call the Suicide and Crisis Lifeline: 988 call the [place] National Suicide Prevention Lifeline: (334)376-2202 or TTY: 670-421-8674 TTY 201-256-7257) to talk to a trained counselor call 1-800-273-TALK (toll free, 24 hour hotline) call 911   The patient verbalized understanding of instructions, educational materials, and care plan provided today and DECLINED offer to receive copy of patient instructions, educational materials, and care plan.   Hubert Azure RN, MSN RN Care Management Coordinator  [facility] [phone] [email or web address]

## 2022-04-18 NOTE — Chronic Care Management (AMB) (Signed)
Chronic Care Management   CCM RN Visit Note  04/18/2022 Name: Cheryl Atkinson MRN: [id number] DOB: 10-23-1931  Subjective: Cheryl Atkinson is a 88 y.o. year old female who is a primary care patient of Durene CalHunter, Aldine ContesStephen O, MD. The care management team was consulted for assistance with disease management and care coordination needs.    Engaged with patient by telephone for follow up visit in response to provider referral for case management and/or care coordination services.   Consent to Services:  The patient was given information about Chronic Care Management services, agreed to services, and gave verbal consent prior to initiation of services.  Please see initial visit note for detailed documentation.   Patient agreed to services and verbal consent obtained.   Assessment: Review of patient past medical history, allergies, medications, health status, including review of consultants reports, laboratory and other test data, was performed as part of comprehensive evaluation and provision of chronic care management services.   SDOH (Social Determinants of Health) assessments and interventions performed:    CCM Care Plan  Allergies  Allergen Reactions   Codeine     hallucinations    Outpatient Encounter Medications as of 04/18/2022  Medication Sig Note   Albuterol Sulfate (PROAIR RESPICLICK) 108 (90 Base) MCG/ACT AEPB Inhale 2 puffs into the lungs every 6 (six) hours as needed (shortness of breath from COPD).    apixaban (ELIQUIS) 2.5 MG TABS tablet Take 1 tablet (2.5 mg total) by mouth 2 (two) times daily.    CALCIUM PO Take by mouth.    carbidopa-levodopa (SINEMET IR) 25-100 MG tablet Take daily as needed for worsening RLS.    Cholecalciferol (VITAMIN D3 PO) Take by mouth.    ferrous sulfate 325 (65 FE) MG tablet Take 325 mg by mouth 2 (two) times a week. Patient takes one tablet on Monday and Friday    furosemide (LASIX) 40 MG tablet TAKE 1 TABLET TWICE A DAY    irbesartan (AVAPRO)  300 MG tablet Take 1 tablet (300 mg total) by mouth daily. 08/30/2021: Last filled 7.2.22 for 90 days supply   LINZESS 145 MCG CAPS capsule Take 145 mcg by mouth daily as needed (constipation).    Multiple Vitamins-Minerals (PRESERVISION AREDS 2 PO) Take 1 tablet by mouth 2 (two) times daily.    polyethylene glycol (MIRALAX / GLYCOLAX) 17 g packet Take 17 g by mouth 2 (two) times daily. Reported on 03/14/2016 (Patient taking differently: Take 17 g by mouth daily as needed for mild constipation.)    potassium chloride SA (KLOR-CON) 10 MEQ tablet Take 1 tablet (10 mEq total) by mouth 2 (two) times daily as needed. Needs to be taken  With each dose of lasix    rosuvastatin (CRESTOR) 40 MG tablet TAKE 1 TABLET DAILY    Rotigotine (NEUPRO) 3 MG/24HR PT24 PLACE 1 PATCH (3 MG) ONTO THE SKIN AT BEDTIME (Patient taking differently: Place 3 mg onto the skin at bedtime.) 08/30/2021: Last filled 7.2.22 for 90 days supply   traMADol (ULTRAM) 50 MG tablet Take 1 tablet (50 mg total) by mouth every 6 (six) hours as needed.    traZODone (DESYREL) 50 MG tablet TAKE ONE AND ONE-HALF TABLETS AT BEDTIME AS NEEDED FOR SLEEP    vitamin B-12 (CYANOCOBALAMIN) 1000 MCG tablet Take 1,000 mcg by mouth daily.    No facility-administered encounter medications on file as of 04/18/2022.    Patient Active Problem List   Diagnosis Date Noted   COVID-19 08/30/2021   COVID-19 virus  infection 08/29/2021   Renal lesion 08/29/2021   B12 deficiency 02/27/2021   Pleural effusion due to CHF (congestive heart failure) (HCC) 05/26/2020   S/P laparoscopic cholecystectomy 04/04/2020   Heart failure with preserved ejection fraction (HCC) 03/01/2020   Insomnia 03/01/2020   Bacteremia 01/28/2020   Atrial fibrillation (HCC) 01/25/2020   Abdominal aortic ectasia (HCC) 12/29/2019   Major depression 03/25/2018   Hypokalemia 12/21/2016   Cat bite of right hand 12/21/2016   BPPV (benign paroxysmal positional vertigo) 07/12/2016   Memory loss  04/11/2016   Mallet toe of right foot 10/20/2015   Renal artery stenosis (HCC) 09/27/2015   Hyperlipidemia 06/30/2015   Claudication (HCC) 05/04/2015   Atherosclerotic PVD with intermittent claudication (HCC) 04/26/2015   Tinnitus 12/31/2014   Former smoker 09/29/2014   DNR (do not resuscitate) 09/29/2014   Hyperglycemia 07/19/2014   Multinodular goiter 08/30/2013   Spinal stenosis of lumbar region at multiple levels 09/29/2012   HIP PAIN, BILATERAL 07/16/2010   COPD (chronic obstructive pulmonary disease) (HCC) 09/20/2009   CONSTIPATION, CHRONIC 09/20/2009   Iron deficiency anemia 11/09/2007   History of UTI 11/09/2007   RESTLESS LEG SYNDROME 09/25/2007   Essential hypertension 09/25/2007   GERD 09/25/2007   LOW BACK PAIN 09/25/2007   Osteoporosis 09/25/2007    Conditions to be addressed/monitored:CHF, HTN, and COPD  Care Plan : RNCM  General Plan of Care (Adult)  Updates made by Maple Mirza, RN since 04/18/2022 12:00 AM     Problem: Inability to care for self based on knowledge deficit   Priority: High     Long-Range Goal: Patient will work with CCM team to gain knowledge of self care management of chronic conditions   Start Date: 10/23/2021  Expected End Date: 10/23/2022  Recent Progress: Not on track  Priority: High  Note:   Current Barriers   Knowledge Deficits related to plan of care for management of CHF and Falls  Chronic Disease Management support and education needs related to CHF and Falls Literacy barriers    3/9--patient stating she is fair.  Reports increasing sob while walking down the hall; has appointment with pcp next week.  Encouraged to contact provider for worsening sob.  Denies any chest pains.  Denies any recent falls.  Has not been weighing self daily  3/28--reports not feeling well, "has a cold" but getting better, per patient. Continues to have sore throat and cough; only taking cough drops for treatment.  SOB is better after increase in  fluid medication for the last 2 weeks per pcp instruction.  Continues to not weigh herself or check her blood pressures at home.  4/20--states she feels better.  Weight yesterday was 144 pounds.  Denies any swelling or SOB.  Reporting using rescue inhaler about 3 times last week.  Patient stating her transport chair brakes are broken.  Has a prescription for new one but DME supply states insurance will not cover due to recent purchase of current transport chair; they are willing to fix the brakes  5/18--states she has not been feeling well; has cough with clear sputum. No fever or chills.  Does feel weak and has just requested orders for HHOT to get stronger.  SOB same; with ambulation.  Using rescue inhaler about 3 times a week.  Weight 145 pounds, near baseline. Denies recent fall  RNCM Clinical Goal(s):    Patient will verbalize understanding of plan for management of CHF and Falls as evidenced by daily weight monitoring verbalize basic understanding of  CHF and falls disease process and self health management plan as evidenced by monitoring weights daily and no hospital readmissions take all medications exactly as prescribed and will call provider for medication related questions as evidenced by medication compliance    demonstrate improved health management independence as evidenced by no hospital admissions or falls in the next 90days        through collaboration with RN Care manager, provider, and care team.   Interventions: 1:1 collaboration with primary care provider regarding development and update of comprehensive plan of care as evidenced by provider attestation and co-signature Inter-disciplinary care team collaboration (see longitudinal plan of care) Evaluation of current treatment plan related to  self management and patient's adherence to plan as established by provider   Heart Failure Interventions:  (Status: Goal on track: NO.)  Long Term Goal  Basic overview and discussion of  pathophysiology of Heart Failure reviewed Provided education on low sodium diet Reviewed Heart Failure Action Plan in depth and provided written copy Advised patient to weigh each morning after emptying bladder Discussed importance of daily weight and advised patient to weigh and record daily Reviewed role of diuretics in prevention of fluid overload and management of heart failure Discussed the importance of keeping all appointments with provider Provided patient with education about the role of exercise in the management of heart failure Rediscussed purpose and reasoning of weighing daily and encouraged patient to do so Discussed when to call provider based on weights ENCOURAGED TO CONTACT PROVIDER for cough and request sooner date for June appointment  Falls:  (Status: Goal on Track (progressing): YES. Condition stable. Not addressed this visit.) Long Term Goal  Provided written and verbal education re: potential causes of falls and Fall prevention strategies Reviewed medications and discussed potential side effects of medications such as dizziness and frequent urination Advised patient of importance of notifying provider of falls Assessed for signs and symptoms of orthostatic hypotension Assessed for falls since last encounter Assessed patients knowledge of fall risk prevention secondary to previously provided education Fall precautions and preventions reviewed and discussed Instructed to use walker with all ambulation Encouraged to attend scheduled appointments  Patient Goals/Self-Care Activities: Take medications as prescribed   Attend all scheduled provider appointments Perform all self care activities independently  call office if I gain more than 2 pounds in one day or 5 pounds in one week weigh myself daily bring diary to all appointments develop a rescue plan follow rescue plan if symptoms flare-up know when to call the doctor based on weight and symptoms track symptoms  and what helps feel better or worse Obtain no skid socks Use walker with all ambulation Take transport chair to get brakes fixed       Plan:The care management team will reach out to the patient again over the next 45 days.  Rhae Lerner RN, MSN RN Care Management Coordinator  [facility] [phone] [email or web address]

## 2022-04-23 ENCOUNTER — Telehealth: Payer: Self-pay | Admitting: Family Medicine

## 2022-04-23 NOTE — Telephone Encounter (Signed)
Caller states the following needs to be added to Home Health orders, because current status cannot stand alone.  Physical therapy or Skilled nursing

## 2022-04-23 NOTE — Addendum Note (Signed)
Addended by: Lieutenant Diego A on: 04/23/2022 02:43 PM   Modules accepted: Orders

## 2022-04-23 NOTE — Telephone Encounter (Signed)
error 

## 2022-04-23 NOTE — Telephone Encounter (Signed)
Referral re-entered

## 2022-04-25 DIAGNOSIS — R7881 Bacteremia: Secondary | ICD-10-CM | POA: Diagnosis not present

## 2022-04-25 DIAGNOSIS — D509 Iron deficiency anemia, unspecified: Secondary | ICD-10-CM | POA: Diagnosis not present

## 2022-04-25 DIAGNOSIS — I701 Atherosclerosis of renal artery: Secondary | ICD-10-CM | POA: Diagnosis not present

## 2022-04-25 DIAGNOSIS — E042 Nontoxic multinodular goiter: Secondary | ICD-10-CM | POA: Diagnosis not present

## 2022-04-25 DIAGNOSIS — M48061 Spinal stenosis, lumbar region without neurogenic claudication: Secondary | ICD-10-CM | POA: Diagnosis not present

## 2022-04-25 DIAGNOSIS — I5020 Unspecified systolic (congestive) heart failure: Secondary | ICD-10-CM | POA: Diagnosis not present

## 2022-04-25 DIAGNOSIS — Z87891 Personal history of nicotine dependence: Secondary | ICD-10-CM | POA: Diagnosis not present

## 2022-04-25 DIAGNOSIS — Z8616 Personal history of COVID-19: Secondary | ICD-10-CM | POA: Diagnosis not present

## 2022-04-25 DIAGNOSIS — Z9181 History of falling: Secondary | ICD-10-CM | POA: Diagnosis not present

## 2022-04-25 DIAGNOSIS — K219 Gastro-esophageal reflux disease without esophagitis: Secondary | ICD-10-CM | POA: Diagnosis not present

## 2022-04-25 DIAGNOSIS — I77811 Abdominal aortic ectasia: Secondary | ICD-10-CM | POA: Diagnosis not present

## 2022-04-25 DIAGNOSIS — I110 Hypertensive heart disease with heart failure: Secondary | ICD-10-CM | POA: Diagnosis not present

## 2022-04-25 DIAGNOSIS — E785 Hyperlipidemia, unspecified: Secondary | ICD-10-CM | POA: Diagnosis not present

## 2022-04-25 DIAGNOSIS — I4891 Unspecified atrial fibrillation: Secondary | ICD-10-CM | POA: Diagnosis not present

## 2022-04-25 DIAGNOSIS — J449 Chronic obstructive pulmonary disease, unspecified: Secondary | ICD-10-CM | POA: Diagnosis not present

## 2022-04-25 DIAGNOSIS — G4700 Insomnia, unspecified: Secondary | ICD-10-CM | POA: Diagnosis not present

## 2022-04-25 DIAGNOSIS — Z9049 Acquired absence of other specified parts of digestive tract: Secondary | ICD-10-CM | POA: Diagnosis not present

## 2022-04-25 DIAGNOSIS — G2581 Restless legs syndrome: Secondary | ICD-10-CM | POA: Diagnosis not present

## 2022-04-25 DIAGNOSIS — Z87440 Personal history of urinary (tract) infections: Secondary | ICD-10-CM | POA: Diagnosis not present

## 2022-04-25 DIAGNOSIS — K5909 Other constipation: Secondary | ICD-10-CM | POA: Diagnosis not present

## 2022-04-25 DIAGNOSIS — Z7901 Long term (current) use of anticoagulants: Secondary | ICD-10-CM | POA: Diagnosis not present

## 2022-04-25 DIAGNOSIS — F329 Major depressive disorder, single episode, unspecified: Secondary | ICD-10-CM | POA: Diagnosis not present

## 2022-04-30 ENCOUNTER — Encounter (INDEPENDENT_AMBULATORY_CARE_PROVIDER_SITE_OTHER): Payer: Medicare Other | Admitting: Ophthalmology

## 2022-04-30 DIAGNOSIS — I10 Essential (primary) hypertension: Secondary | ICD-10-CM | POA: Diagnosis not present

## 2022-04-30 DIAGNOSIS — H35033 Hypertensive retinopathy, bilateral: Secondary | ICD-10-CM

## 2022-04-30 DIAGNOSIS — H353221 Exudative age-related macular degeneration, left eye, with active choroidal neovascularization: Secondary | ICD-10-CM | POA: Diagnosis not present

## 2022-04-30 DIAGNOSIS — H353112 Nonexudative age-related macular degeneration, right eye, intermediate dry stage: Secondary | ICD-10-CM

## 2022-04-30 DIAGNOSIS — H43813 Vitreous degeneration, bilateral: Secondary | ICD-10-CM

## 2022-05-01 DIAGNOSIS — I509 Heart failure, unspecified: Secondary | ICD-10-CM

## 2022-05-01 DIAGNOSIS — D509 Iron deficiency anemia, unspecified: Secondary | ICD-10-CM | POA: Diagnosis not present

## 2022-05-01 DIAGNOSIS — I110 Hypertensive heart disease with heart failure: Secondary | ICD-10-CM | POA: Diagnosis not present

## 2022-05-01 DIAGNOSIS — I77811 Abdominal aortic ectasia: Secondary | ICD-10-CM | POA: Diagnosis not present

## 2022-05-01 DIAGNOSIS — I5020 Unspecified systolic (congestive) heart failure: Secondary | ICD-10-CM | POA: Diagnosis not present

## 2022-05-01 DIAGNOSIS — J449 Chronic obstructive pulmonary disease, unspecified: Secondary | ICD-10-CM | POA: Diagnosis not present

## 2022-05-01 DIAGNOSIS — I4891 Unspecified atrial fibrillation: Secondary | ICD-10-CM | POA: Diagnosis not present

## 2022-05-02 ENCOUNTER — Telehealth: Payer: Self-pay | Admitting: Family Medicine

## 2022-05-02 NOTE — Telephone Encounter (Signed)
..  Home Health Certification or Plan of Care Tracking  Is this a Certification or Plan of Care? yes  [facility] Agency: [facility]  Order Number:  [id number]  Has charge sheet been attached? yes  Where has form been placed:  In provider's box  Faxed to:   [phone]

## 2022-05-02 NOTE — Telephone Encounter (Signed)
Noted  

## 2022-05-03 DIAGNOSIS — D509 Iron deficiency anemia, unspecified: Secondary | ICD-10-CM | POA: Diagnosis not present

## 2022-05-03 DIAGNOSIS — I110 Hypertensive heart disease with heart failure: Secondary | ICD-10-CM | POA: Diagnosis not present

## 2022-05-03 DIAGNOSIS — I5020 Unspecified systolic (congestive) heart failure: Secondary | ICD-10-CM | POA: Diagnosis not present

## 2022-05-03 DIAGNOSIS — J449 Chronic obstructive pulmonary disease, unspecified: Secondary | ICD-10-CM | POA: Diagnosis not present

## 2022-05-03 DIAGNOSIS — I77811 Abdominal aortic ectasia: Secondary | ICD-10-CM | POA: Diagnosis not present

## 2022-05-03 DIAGNOSIS — I4891 Unspecified atrial fibrillation: Secondary | ICD-10-CM | POA: Diagnosis not present

## 2022-05-07 DIAGNOSIS — I110 Hypertensive heart disease with heart failure: Secondary | ICD-10-CM | POA: Diagnosis not present

## 2022-05-07 DIAGNOSIS — D509 Iron deficiency anemia, unspecified: Secondary | ICD-10-CM | POA: Diagnosis not present

## 2022-05-07 DIAGNOSIS — J449 Chronic obstructive pulmonary disease, unspecified: Secondary | ICD-10-CM | POA: Diagnosis not present

## 2022-05-07 DIAGNOSIS — I5020 Unspecified systolic (congestive) heart failure: Secondary | ICD-10-CM | POA: Diagnosis not present

## 2022-05-07 DIAGNOSIS — I4891 Unspecified atrial fibrillation: Secondary | ICD-10-CM | POA: Diagnosis not present

## 2022-05-07 DIAGNOSIS — I77811 Abdominal aortic ectasia: Secondary | ICD-10-CM | POA: Diagnosis not present

## 2022-05-10 DIAGNOSIS — I5020 Unspecified systolic (congestive) heart failure: Secondary | ICD-10-CM | POA: Diagnosis not present

## 2022-05-10 DIAGNOSIS — I4891 Unspecified atrial fibrillation: Secondary | ICD-10-CM | POA: Diagnosis not present

## 2022-05-10 DIAGNOSIS — I77811 Abdominal aortic ectasia: Secondary | ICD-10-CM | POA: Diagnosis not present

## 2022-05-10 DIAGNOSIS — I110 Hypertensive heart disease with heart failure: Secondary | ICD-10-CM | POA: Diagnosis not present

## 2022-05-10 DIAGNOSIS — D509 Iron deficiency anemia, unspecified: Secondary | ICD-10-CM | POA: Diagnosis not present

## 2022-05-10 DIAGNOSIS — J449 Chronic obstructive pulmonary disease, unspecified: Secondary | ICD-10-CM | POA: Diagnosis not present

## 2022-05-14 ENCOUNTER — Ambulatory Visit (INDEPENDENT_AMBULATORY_CARE_PROVIDER_SITE_OTHER): Payer: Medicare Other | Admitting: *Deleted

## 2022-05-14 DIAGNOSIS — I110 Hypertensive heart disease with heart failure: Secondary | ICD-10-CM | POA: Diagnosis not present

## 2022-05-14 DIAGNOSIS — I5020 Unspecified systolic (congestive) heart failure: Secondary | ICD-10-CM | POA: Diagnosis not present

## 2022-05-14 DIAGNOSIS — J449 Chronic obstructive pulmonary disease, unspecified: Secondary | ICD-10-CM | POA: Diagnosis not present

## 2022-05-14 DIAGNOSIS — I77811 Abdominal aortic ectasia: Secondary | ICD-10-CM | POA: Diagnosis not present

## 2022-05-14 DIAGNOSIS — I5032 Chronic diastolic (congestive) heart failure: Secondary | ICD-10-CM

## 2022-05-14 DIAGNOSIS — D509 Iron deficiency anemia, unspecified: Secondary | ICD-10-CM | POA: Diagnosis not present

## 2022-05-14 DIAGNOSIS — I4891 Unspecified atrial fibrillation: Secondary | ICD-10-CM | POA: Diagnosis not present

## 2022-05-14 NOTE — Chronic Care Management (AMB) (Signed)
Chronic Care Management   CCM RN Visit Note  05/14/2022 Name: Cheryl Atkinson MRN: [id number] DOB: 1931/05/02  Subjective: Cheryl Atkinson is a 88 y.o. year old female who is a primary care patient of Durene Cal, [name], MD. The care management team was consulted for assistance with disease management and care coordination needs.    Engaged with patient by telephone for follow up visit in response to provider referral for case management and/or care coordination services.   Consent to Services:  The patient was given information about Chronic Care Management services, agreed to services, and gave verbal consent prior to initiation of services.  Please see initial visit note for detailed documentation.   Patient agreed to services and verbal consent obtained.   Assessment: Review of patient past medical history, allergies, medications, health status, including review of consultants reports, laboratory and other test data, was performed as part of comprehensive evaluation and provision of chronic care management services.   SDOH (Social Determinants of Health) assessments and interventions performed:    CCM Care Plan  Allergies  Allergen Reactions   Codeine     hallucinations    Outpatient Encounter Medications as of 05/14/2022  Medication Sig Note   Albuterol Sulfate (PROAIR RESPICLICK) 108 (90 Base) MCG/ACT AEPB Inhale 2 puffs into the lungs every 6 (six) hours as needed (shortness of breath from COPD).    apixaban (ELIQUIS) 2.5 MG TABS tablet Take 1 tablet (2.5 mg total) by mouth 2 (two) times daily.    CALCIUM PO Take by mouth.    carbidopa-levodopa (SINEMET IR) 25-100 MG tablet Take daily as needed for worsening RLS.    Cholecalciferol (VITAMIN D3 PO) Take by mouth.    ferrous sulfate 325 (65 FE) MG tablet Take 325 mg by mouth 2 (two) times a week. Patient takes one tablet on Monday and Friday    furosemide (LASIX) 40 MG tablet TAKE 1 TABLET TWICE A DAY    irbesartan (AVAPRO)  300 MG tablet Take 1 tablet (300 mg total) by mouth daily. 08/30/2021: Last filled 7.2.22 for 90 days supply   LINZESS 145 MCG CAPS capsule Take 145 mcg by mouth daily as needed (constipation).    Multiple Vitamins-Minerals (PRESERVISION AREDS 2 PO) Take 1 tablet by mouth 2 (two) times daily.    polyethylene glycol (MIRALAX / GLYCOLAX) 17 g packet Take 17 g by mouth 2 (two) times daily. Reported on 03/14/2016 (Patient taking differently: Take 17 g by mouth daily as needed for mild constipation.)    potassium chloride SA (KLOR-CON) 10 MEQ tablet Take 1 tablet (10 mEq total) by mouth 2 (two) times daily as needed. Needs to be taken  With each dose of lasix    rosuvastatin (CRESTOR) 40 MG tablet TAKE 1 TABLET DAILY    Rotigotine (NEUPRO) 3 MG/24HR PT24 PLACE 1 PATCH (3 MG) ONTO THE SKIN AT BEDTIME (Patient taking differently: Place 3 mg onto the skin at bedtime.) 08/30/2021: Last filled 7.2.22 for 90 days supply   traMADol (ULTRAM) 50 MG tablet Take 1 tablet (50 mg total) by mouth every 6 (six) hours as needed.    traZODone (DESYREL) 50 MG tablet TAKE ONE AND ONE-HALF TABLETS AT BEDTIME AS NEEDED FOR SLEEP    vitamin B-12 (CYANOCOBALAMIN) 1000 MCG tablet Take 1,000 mcg by mouth daily.    No facility-administered encounter medications on file as of 05/14/2022.    Conditions to be addressed/monitored:HTN and COPD  Care Plan : RNCM  General Plan of Care (Adult)  Updates made by Maple Mirza, RN since 05/14/2022 12:00 AM     Problem: Inability to care for self based on knowledge deficit   Priority: High     Long-Range Goal: Patient will work with CCM team to gain knowledge of self care management of chronic conditions   Start Date: 10/23/2021  Expected End Date: 10/23/2022  Recent Progress: Not on track  Priority: High  Note:   Current Barriers   Knowledge Deficits related to plan of care for management of CHF and Falls  Chronic Disease Management support and education needs related to CHF  and Falls Literacy barriers    3/9--patient stating she is fair.  Reports increasing sob while walking down the hall; has appointment with pcp next week.  Encouraged to contact provider for worsening sob.  Denies any chest pains.  Denies any recent falls.  Has not been weighing self daily  3/28--reports not feeling well, "has a cold" but getting better, per patient. Continues to have sore throat and cough; only taking cough drops for treatment.  SOB is better after increase in fluid medication for the last 2 weeks per pcp instruction.  Continues to not weigh herself or check her blood pressures at home.  4/20--states she feels better.  Weight yesterday was 144 pounds.  Denies any swelling or SOB.  Reporting using rescue inhaler about 3 times last week.  Patient stating her transport chair brakes are broken.  Has a prescription for new one but DME supply states insurance will not cover due to recent purchase of current transport chair; they are willing to fix the brakes  5/18--states she has not been feeling well; has cough with clear sputum. No fever or chills.  Does feel weak and has just requested orders for HHOT to get stronger.  SOB same; with ambulation.  Using rescue inhaler about 3 times a week.  Weight 145 pounds, near baseline. Denies recent fall  6/13--reports fall transfering to bedside commode on 6/3.  denies injury.  Admits to being weak and participating in home health therapy.  Cough is resolved.  Weight remains 145 pounds.  Denies chest pain, SOB, Lightheadiness or dizziness.  Reports using rescue inhaler about once in the last week  RNCM Clinical Goal(s):    Patient will verbalize understanding of plan for management of CHF and Falls as evidenced by daily weight monitoring verbalize basic understanding of CHF and falls disease process and self health management plan as evidenced by monitoring weights daily and no hospital readmissions take all medications exactly as prescribed and  will call provider for medication related questions as evidenced by medication compliance    demonstrate improved health management independence as evidenced by no hospital admissions or falls in the next 90days        through collaboration with RN Care manager, provider, and care team.   Interventions: 1:1 collaboration with primary care provider regarding development and update of comprehensive plan of care as evidenced by provider attestation and co-signature Inter-disciplinary care team collaboration (see longitudinal plan of care) Evaluation of current treatment plan related to  self management and patient's adherence to plan as established by provider   Heart Failure Interventions:  (Status: Goal on Track (progressing): YES.)  Long Term Goal  Basic overview and discussion of pathophysiology of Heart Failure reviewed Provided education on low sodium diet Reviewed Heart Failure Action Plan in depth and provided written copy Advised patient to weigh each morning after emptying bladder Discussed importance of daily weight and  advised patient to weigh and record daily Reviewed role of diuretics in prevention of fluid overload and management of heart failure Discussed the importance of keeping all appointments with provider Provided patient with education about the role of exercise in the management of heart failure Rediscussed purpose and reasoning of weighing daily and encouraged patient to do so Discussed when to call provider based on weights  Falls:  (Status: Goal on track: NO.) Long Term Goal  Provided written and verbal education re: potential causes of falls and Fall prevention strategies Reviewed medications and discussed potential side effects of medications such as dizziness and frequent urination Advised patient of importance of notifying provider of falls Assessed for signs and symptoms of orthostatic hypotension Assessed for falls since last encounter Assessed patients  knowledge of fall risk prevention secondary to previously provided education Fall precautions and preventions reviewed and discussed again Instructed to use walker with all ambulation Encouraged to attend scheduled appointments Discussed and encouraged changing positions slowly Encouraged participation with therapy Confirmed use of life alert at all times  Patient Goals/Self-Care Activities: Take medications as prescribed   Attend all scheduled provider appointments Perform all self care activities independently  call office if I gain more than 2 pounds in one day or 5 pounds in one week weigh myself daily bring diary to all appointments develop a rescue plan follow rescue plan if symptoms flare-up know when to call the doctor based on weight and symptoms track symptoms and what helps feel better or worse Obtain no skid socks Use walker with all ambulation Take transport chair to get brakes fixed       Plan:The care management team will reach out to the patient again over the next 45 days.  Rhae LernerFarrah Lariya Kinzie RN, MSN RN Care Management Coordinator  [facility] [place] [phone] [email or web address]

## 2022-05-14 NOTE — Patient Instructions (Addendum)
Visit Information  Thank you for taking time to visit with me today. Please don't hesitate to contact me if I can be of assistance to you before our next scheduled telephone appointment.  Following are the goals we discussed today:  Take medications as prescribed   Attend all scheduled provider appointments Perform all self care activities independently  call office if I gain more than 2 pounds in one day or 5 pounds in one week weigh myself daily bring diary to all appointments develop a rescue plan follow rescue plan if symptoms flare-up know when to call the doctor based on weight and symptoms track symptoms and what helps feel better or worse Obtain no skid socks Use walker with all ambulation Take transport chair to get brakes fixed  Our next appointment is by telephone on 7/11 at 1200  Please call the care guide team at [phone] if you need to cancel or reschedule your appointment.   If you are experiencing a Mental Health or Behavioral Health Crisis or need someone to talk to, please call the Suicide and Crisis Lifeline: 988 call the [place] National Suicide Prevention Lifeline: 615-657-5294 or TTY: 954-298-0669 TTY (224) 299-6577) to talk to a trained counselor call 1-800-273-TALK (toll free, 24 hour hotline) call 911   The patient verbalized understanding of instructions, educational materials, and care plan provided today and DECLINED offer to receive copy of patient instructions, educational materials, and care plan.   Rhae Lerner RN, MSN RN Care Management Coordinator  [facility] [place] [phone] [email or web address]

## 2022-05-15 DIAGNOSIS — I5020 Unspecified systolic (congestive) heart failure: Secondary | ICD-10-CM | POA: Diagnosis not present

## 2022-05-15 DIAGNOSIS — I77811 Abdominal aortic ectasia: Secondary | ICD-10-CM | POA: Diagnosis not present

## 2022-05-15 DIAGNOSIS — I4891 Unspecified atrial fibrillation: Secondary | ICD-10-CM | POA: Diagnosis not present

## 2022-05-15 DIAGNOSIS — D509 Iron deficiency anemia, unspecified: Secondary | ICD-10-CM | POA: Diagnosis not present

## 2022-05-15 DIAGNOSIS — I110 Hypertensive heart disease with heart failure: Secondary | ICD-10-CM | POA: Diagnosis not present

## 2022-05-15 DIAGNOSIS — J449 Chronic obstructive pulmonary disease, unspecified: Secondary | ICD-10-CM | POA: Diagnosis not present

## 2022-05-16 DIAGNOSIS — I5020 Unspecified systolic (congestive) heart failure: Secondary | ICD-10-CM | POA: Diagnosis not present

## 2022-05-16 DIAGNOSIS — D509 Iron deficiency anemia, unspecified: Secondary | ICD-10-CM | POA: Diagnosis not present

## 2022-05-16 DIAGNOSIS — I77811 Abdominal aortic ectasia: Secondary | ICD-10-CM | POA: Diagnosis not present

## 2022-05-16 DIAGNOSIS — I110 Hypertensive heart disease with heart failure: Secondary | ICD-10-CM | POA: Diagnosis not present

## 2022-05-16 DIAGNOSIS — I4891 Unspecified atrial fibrillation: Secondary | ICD-10-CM | POA: Diagnosis not present

## 2022-05-16 DIAGNOSIS — J449 Chronic obstructive pulmonary disease, unspecified: Secondary | ICD-10-CM | POA: Diagnosis not present

## 2022-05-21 ENCOUNTER — Telehealth: Payer: Self-pay | Admitting: Family Medicine

## 2022-05-21 DIAGNOSIS — I110 Hypertensive heart disease with heart failure: Secondary | ICD-10-CM | POA: Diagnosis not present

## 2022-05-21 DIAGNOSIS — D509 Iron deficiency anemia, unspecified: Secondary | ICD-10-CM | POA: Diagnosis not present

## 2022-05-21 DIAGNOSIS — I4891 Unspecified atrial fibrillation: Secondary | ICD-10-CM | POA: Diagnosis not present

## 2022-05-21 DIAGNOSIS — J449 Chronic obstructive pulmonary disease, unspecified: Secondary | ICD-10-CM | POA: Diagnosis not present

## 2022-05-21 DIAGNOSIS — I77811 Abdominal aortic ectasia: Secondary | ICD-10-CM | POA: Diagnosis not present

## 2022-05-21 DIAGNOSIS — I5020 Unspecified systolic (congestive) heart failure: Secondary | ICD-10-CM | POA: Diagnosis not present

## 2022-05-21 NOTE — Telephone Encounter (Signed)
Cheryl Atkinson with [facility] requests to be called at ph# [phone] to be given verbal orders for Occupational Therapy -1x per week for 2 weeks, then 2x per week for 2 weeks. Cheryl Atkinson states if no answer on the above phone, please leave the verbal orders as requested on her voice mail.

## 2022-05-21 NOTE — Telephone Encounter (Signed)
Called and lm on Melissa vm with VO. 

## 2022-05-22 ENCOUNTER — Telehealth: Payer: Self-pay | Admitting: Family Medicine

## 2022-05-22 NOTE — Telephone Encounter (Signed)
Enrique Sack with [facility] ph# [phone] requests an Order for a Medical Social Worker to assist with [facility] due Family is overwhelmed with Patient Care, per [place].

## 2022-05-23 NOTE — Telephone Encounter (Signed)
Called and spoke with Enrique Sack and VO given for the below.

## 2022-05-25 DIAGNOSIS — I4891 Unspecified atrial fibrillation: Secondary | ICD-10-CM | POA: Diagnosis not present

## 2022-05-25 DIAGNOSIS — J449 Chronic obstructive pulmonary disease, unspecified: Secondary | ICD-10-CM | POA: Diagnosis not present

## 2022-05-25 DIAGNOSIS — Z9049 Acquired absence of other specified parts of digestive tract: Secondary | ICD-10-CM | POA: Diagnosis not present

## 2022-05-25 DIAGNOSIS — K5909 Other constipation: Secondary | ICD-10-CM | POA: Diagnosis not present

## 2022-05-25 DIAGNOSIS — K219 Gastro-esophageal reflux disease without esophagitis: Secondary | ICD-10-CM | POA: Diagnosis not present

## 2022-05-25 DIAGNOSIS — F329 Major depressive disorder, single episode, unspecified: Secondary | ICD-10-CM | POA: Diagnosis not present

## 2022-05-25 DIAGNOSIS — I110 Hypertensive heart disease with heart failure: Secondary | ICD-10-CM | POA: Diagnosis not present

## 2022-05-25 DIAGNOSIS — Z87891 Personal history of nicotine dependence: Secondary | ICD-10-CM | POA: Diagnosis not present

## 2022-05-25 DIAGNOSIS — D509 Iron deficiency anemia, unspecified: Secondary | ICD-10-CM | POA: Diagnosis not present

## 2022-05-25 DIAGNOSIS — M48061 Spinal stenosis, lumbar region without neurogenic claudication: Secondary | ICD-10-CM | POA: Diagnosis not present

## 2022-05-25 DIAGNOSIS — Z7901 Long term (current) use of anticoagulants: Secondary | ICD-10-CM | POA: Diagnosis not present

## 2022-05-25 DIAGNOSIS — Z9181 History of falling: Secondary | ICD-10-CM | POA: Diagnosis not present

## 2022-05-25 DIAGNOSIS — R7881 Bacteremia: Secondary | ICD-10-CM | POA: Diagnosis not present

## 2022-05-25 DIAGNOSIS — E785 Hyperlipidemia, unspecified: Secondary | ICD-10-CM | POA: Diagnosis not present

## 2022-05-25 DIAGNOSIS — I701 Atherosclerosis of renal artery: Secondary | ICD-10-CM | POA: Diagnosis not present

## 2022-05-25 DIAGNOSIS — E042 Nontoxic multinodular goiter: Secondary | ICD-10-CM | POA: Diagnosis not present

## 2022-05-25 DIAGNOSIS — I5020 Unspecified systolic (congestive) heart failure: Secondary | ICD-10-CM | POA: Diagnosis not present

## 2022-05-25 DIAGNOSIS — I77811 Abdominal aortic ectasia: Secondary | ICD-10-CM | POA: Diagnosis not present

## 2022-05-25 DIAGNOSIS — Z87440 Personal history of urinary (tract) infections: Secondary | ICD-10-CM | POA: Diagnosis not present

## 2022-05-25 DIAGNOSIS — G2581 Restless legs syndrome: Secondary | ICD-10-CM | POA: Diagnosis not present

## 2022-05-25 DIAGNOSIS — G4700 Insomnia, unspecified: Secondary | ICD-10-CM | POA: Diagnosis not present

## 2022-05-25 DIAGNOSIS — Z8616 Personal history of COVID-19: Secondary | ICD-10-CM | POA: Diagnosis not present

## 2022-05-28 ENCOUNTER — Encounter (INDEPENDENT_AMBULATORY_CARE_PROVIDER_SITE_OTHER): Payer: Medicare Other | Admitting: Ophthalmology

## 2022-05-28 DIAGNOSIS — H35033 Hypertensive retinopathy, bilateral: Secondary | ICD-10-CM | POA: Diagnosis not present

## 2022-05-28 DIAGNOSIS — H353112 Nonexudative age-related macular degeneration, right eye, intermediate dry stage: Secondary | ICD-10-CM | POA: Diagnosis not present

## 2022-05-28 DIAGNOSIS — H43813 Vitreous degeneration, bilateral: Secondary | ICD-10-CM | POA: Diagnosis not present

## 2022-05-28 DIAGNOSIS — I10 Essential (primary) hypertension: Secondary | ICD-10-CM

## 2022-05-28 DIAGNOSIS — H353221 Exudative age-related macular degeneration, left eye, with active choroidal neovascularization: Secondary | ICD-10-CM | POA: Diagnosis not present

## 2022-05-29 DIAGNOSIS — J449 Chronic obstructive pulmonary disease, unspecified: Secondary | ICD-10-CM | POA: Diagnosis not present

## 2022-05-29 DIAGNOSIS — I77811 Abdominal aortic ectasia: Secondary | ICD-10-CM | POA: Diagnosis not present

## 2022-05-29 DIAGNOSIS — I110 Hypertensive heart disease with heart failure: Secondary | ICD-10-CM | POA: Diagnosis not present

## 2022-05-29 DIAGNOSIS — I5020 Unspecified systolic (congestive) heart failure: Secondary | ICD-10-CM | POA: Diagnosis not present

## 2022-05-29 DIAGNOSIS — I4891 Unspecified atrial fibrillation: Secondary | ICD-10-CM | POA: Diagnosis not present

## 2022-05-29 DIAGNOSIS — D509 Iron deficiency anemia, unspecified: Secondary | ICD-10-CM | POA: Diagnosis not present

## 2022-05-30 ENCOUNTER — Encounter: Payer: Self-pay | Admitting: Family Medicine

## 2022-05-30 ENCOUNTER — Ambulatory Visit (INDEPENDENT_AMBULATORY_CARE_PROVIDER_SITE_OTHER): Payer: Medicare Other | Admitting: Family Medicine

## 2022-05-30 VITALS — BP 130/82 | HR 91 | Temp 97.9°F | Ht 66.0 in | Wt 145.0 lb

## 2022-05-30 DIAGNOSIS — I5032 Chronic diastolic (congestive) heart failure: Secondary | ICD-10-CM

## 2022-05-30 DIAGNOSIS — I4891 Unspecified atrial fibrillation: Secondary | ICD-10-CM

## 2022-05-30 DIAGNOSIS — I701 Atherosclerosis of renal artery: Secondary | ICD-10-CM

## 2022-05-30 DIAGNOSIS — E785 Hyperlipidemia, unspecified: Secondary | ICD-10-CM | POA: Diagnosis not present

## 2022-05-30 DIAGNOSIS — I10 Essential (primary) hypertension: Secondary | ICD-10-CM

## 2022-05-30 MED ORDER — RAMELTEON 8 MG PO TABS
8.0000 mg | ORAL_TABLET | Freq: Every day | ORAL | 5 refills | Status: DC
Start: 2022-05-30 — End: 2022-07-18

## 2022-05-30 NOTE — Patient Instructions (Addendum)
No labs today!   Glad you are doing so well other than sleep -trial rozerem/ramelteon 8 mg 30 minutes before bed. No tv/phone/cpu after taking the medicine.   Recommended follow up: Return in about 2 months (around 07/30/2022) for followup or sooner if needed.Schedule b4 you leave.

## 2022-05-30 NOTE — Progress Notes (Signed)
Phone [phone] In person visit   Subjective:   Cheryl Atkinson is a 88 y.o. year old very pleasant female patient who presents for/with See problem oriented charting Chief Complaint  Patient presents with   Follow-up   Hypertension   Diabetes    Past Medical History-  Patient Active Problem List   Diagnosis Date Noted   Pleural effusion due to CHF (congestive heart failure) (HCC) 05/26/2020    Priority: High   Heart failure with preserved ejection fraction (HCC) 03/01/2020    Priority: High   Bacteremia 01/28/2020    Priority: High   Atrial fibrillation (HCC) 01/25/2020    Priority: High   Memory loss 04/11/2016    Priority: High   Renal artery stenosis (HCC) 09/27/2015    Priority: High   Claudication (HCC) 05/04/2015    Priority: High   Atherosclerotic PVD with intermittent claudication (HCC) 04/26/2015    Priority: High   DNR (do not resuscitate) 09/29/2014    Priority: High   Hyperglycemia 07/19/2014    Priority: High   LOW BACK PAIN 09/25/2007    Priority: High   B12 deficiency 02/27/2021    Priority: Medium    Insomnia 03/01/2020    Priority: Medium    Major depression 03/25/2018    Priority: Medium    BPPV (benign paroxysmal positional vertigo) 07/12/2016    Priority: Medium    Hyperlipidemia 06/30/2015    Priority: Medium    Former smoker 09/29/2014    Priority: Medium    COPD (chronic obstructive pulmonary disease) (HCC) 09/20/2009    Priority: Medium    Iron deficiency anemia 11/09/2007    Priority: Medium    RESTLESS LEG SYNDROME 09/25/2007    Priority: Medium    Essential hypertension 09/25/2007    Priority: Medium    GERD 09/25/2007    Priority: Medium    Osteoporosis 09/25/2007    Priority: Medium    COVID-19 virus infection 08/29/2021    Priority: Low   S/P laparoscopic cholecystectomy 04/04/2020    Priority: Low   Abdominal aortic ectasia (HCC) 12/29/2019    Priority: Low   Cat bite of right hand 12/21/2016    Priority: Low    Mallet toe of right foot 10/20/2015    Priority: Low   Tinnitus 12/31/2014    Priority: Low   Multinodular goiter 08/30/2013    Priority: Low   Spinal stenosis of lumbar region at multiple levels 09/29/2012    Priority: Low   HIP PAIN, BILATERAL 07/16/2010    Priority: Low   CONSTIPATION, CHRONIC 09/20/2009    Priority: Low   History of UTI 11/09/2007    Priority: Low   Renal lesion 08/29/2021    Medications- reviewed and updated Current Outpatient Medications  Medication Sig Dispense Refill   ramelteon (ROZEREM) 8 MG tablet Take 1 tablet (8 mg total) by mouth at bedtime. 30 minutes before bed and no tv once you take this 30 tablet 5   Albuterol Sulfate (PROAIR RESPICLICK) 108 (90 Base) MCG/ACT AEPB Inhale 2 puffs into the lungs every 6 (six) hours as needed (shortness of breath from COPD). 1 each 5   apixaban (ELIQUIS) 2.5 MG TABS tablet Take 1 tablet (2.5 mg total) by mouth 2 (two) times daily. 180 tablet 3   CALCIUM PO Take by mouth.     carbidopa-levodopa (SINEMET IR) 25-100 MG tablet Take daily as needed for worsening RLS. 90 tablet 1   Cholecalciferol (VITAMIN D3 PO) Take by mouth.  ferrous sulfate 325 (65 FE) MG tablet Take 325 mg by mouth 2 (two) times a week. Patient takes one tablet on Monday and Friday     furosemide (LASIX) 40 MG tablet TAKE 1 TABLET TWICE A DAY 180 tablet 3   irbesartan (AVAPRO) 300 MG tablet Take 1 tablet (300 mg total) by mouth daily. 90 tablet 3   LINZESS 145 MCG CAPS capsule Take 145 mcg by mouth daily as needed (constipation).     Multiple Vitamins-Minerals (PRESERVISION AREDS 2 PO) Take 1 tablet by mouth 2 (two) times daily.     polyethylene glycol (MIRALAX / GLYCOLAX) 17 g packet Take 17 g by mouth 2 (two) times daily. Reported on 03/14/2016 (Patient taking differently: Take 17 g by mouth daily as needed for mild constipation.)     potassium chloride SA (KLOR-CON) 10 MEQ tablet Take 1 tablet (10 mEq total) by mouth 2 (two) times daily as needed.  Needs to be taken  With each dose of lasix     rosuvastatin (CRESTOR) 40 MG tablet TAKE 1 TABLET DAILY 90 tablet 3   Rotigotine (NEUPRO) 3 MG/24HR PT24 PLACE 1 PATCH (3 MG) ONTO THE SKIN AT BEDTIME (Patient taking differently: Place 3 mg onto the skin at bedtime.) 90 patch 3   traMADol (ULTRAM) 50 MG tablet Take 1 tablet (50 mg total) by mouth every 6 (six) hours as needed. 30 tablet 0   vitamin B-12 (CYANOCOBALAMIN) 1000 MCG tablet Take 1,000 mcg by mouth daily.     No current facility-administered medications for this visit.     Objective:  BP 130/82   Pulse 91   Temp 97.9 F (36.6 C)   Ht 5\' 6"  (1.676 m)   Wt 145 lb (65.8 kg)   LMP  (LMP Unknown)   SpO2 96%   BMI 23.40 kg/m  Gen: NAD, resting comfortably CV: regular heart rate, no murmurs rubs or gallops Lungs: CTAB no crackles, wheeze, rhonchi Ext: no edema Skin: warm, dry    Assessment and Plan   #Morning headaches S: At last visit patient reported a month of 4 out of 10 maximum headaches that seem to start in her neck and resolved within 20 minutes of getting up out of bed - She wanted to try to adjust her sleeping position and wanted to hold off on neuroimaging A/P: headaches have improved- as soon as she gets out of bed now resolves- has not been able to position better in the bed though- will monitor   #Updated wheelchair prescription-we wrote prescription last visit - was not able to get new chair even though hers is broken- she is using a transport chair not a wheelchair -doing OT to help with mobility/transports  #Insomnia- only able to sleep an hour or so with the trazodone 75 mg- higher dose has not helped -watches tv up until bedtime and advised to stop -wants to trial rozerem/ramelteon 8 mg after discussion   # Atrial fibrillation-  onset 2021 S: Rate controlled without medication  Anticoagulated with  eliquis 2.5 mg BID. No falls recently! Patient is  followed by cardiology: Dr. 2022  A/P: appropriately  anticoagulated and rate controlled (without meds) -continue current medication  #CHF with preserved EF  #Hypertension S: Compliant with irbesartan 300 mg (ok per cards as only unilateral renal artery stenosis) and lasix 40mg   only on once a day right now and doing ok  -off hctz since starting lasix -off amlodiine march 2021 with edema issues and again 2022  -  Weight actually down 2 pounds compared to last visit, denies increased edema or shortness of breath   BP Readings from Last 3 Encounters:  05/30/22 130/82  02/12/22 (!) 138/94  12/11/21 132/80   A/P: Hypertension and CHF appears stable-continue current medications  #Restless leg syndrome S: Patient on Neupro 3 mg (history of augmentation). Ferritin levels have been low in the past -Also takes Sinemet as needed per Dr. Allena Katz A/P: Overall stable-continue current medication   #Hyperlipidemia/peripheral vascular disease/renal artery stenosis S: Compliant with atorvastatin 40 mg-->rosuvastatin 40mg  with LDL above 70 in early 2022 and also on eliquis (prior plavix/asa prior to a fib).  She has known atherosclerotic peripheral vascular disease with intermittent claudication- but sedentary mainly  Lab Results  Component Value Date   CHOL 163 02/12/2022   HDL 86.70 02/12/2022   LDLCALC 68 02/12/2022   LDLDIRECT 60.0 10/09/2021   TRIG 45.0 02/12/2022   CHOLHDL 2 02/12/2022   A/P: For peripheral vascular disease and renal artery stenosis continue risk factor modification with statin and control blood pressure-no obvious worsening -Lipids at goal with LDL under 70-continue current medication   #Insulin resistance/hyperglycemia S: prior metformin 500 milligrams daily- diarrhea and low b12  Lab Results  Component Value Date   HGBA1C 6.4 02/12/2022   HGBA1C 6.4 07/17/2021   HGBA1C 6.2 12/04/2020  A/P: Prediabetes noted but too early for repeat-continue to follow  #Chronic low back pain-can refill tramadol if needed but does not need  today    #COPD S: Finds albuterol helpful starting 2020.  She had improvement in shortness of breath when she quit smoking.   A/P: sparing albuterol helpful- continue to monitor    Recommended follow up: Return in about 2 months (around 07/30/2022) for followup or sooner if needed.Schedule b4 you leave. Future Appointments  Date Time Provider Department Center  06/11/2022 12:00 PM [facility]ARE [facility]  06/25/2022  8:15 AM 06/27/2022, MD [facility] None  08/08/2022  9:30 AM [facility] HEALTH COACH [facility]  10/18/2022  8:30 AM Patel, Donika K, DO [facility] None    Lab/Order associations:   ICD-10-CM   1. Chronic heart failure with preserved ejection fraction (HCC)  I50.32     2. Atrial fibrillation, unspecified type (HCC)  I48.91     3. Essential hypertension  I10     4. Hyperlipidemia, unspecified hyperlipidemia type  E78.5       Meds ordered this encounter  Medications   ramelteon (ROZEREM) 8 MG tablet    Sig: Take 1 tablet (8 mg total) by mouth at bedtime. 30 minutes before bed and no tv once you take this    Dispense:  30 tablet    Refill:  5    Return precautions advised.  10/20/2022, MD

## 2022-05-31 DIAGNOSIS — I5032 Chronic diastolic (congestive) heart failure: Secondary | ICD-10-CM

## 2022-06-03 DIAGNOSIS — J449 Chronic obstructive pulmonary disease, unspecified: Secondary | ICD-10-CM | POA: Diagnosis not present

## 2022-06-03 DIAGNOSIS — D509 Iron deficiency anemia, unspecified: Secondary | ICD-10-CM | POA: Diagnosis not present

## 2022-06-03 DIAGNOSIS — I77811 Abdominal aortic ectasia: Secondary | ICD-10-CM | POA: Diagnosis not present

## 2022-06-03 DIAGNOSIS — I5020 Unspecified systolic (congestive) heart failure: Secondary | ICD-10-CM | POA: Diagnosis not present

## 2022-06-03 DIAGNOSIS — I4891 Unspecified atrial fibrillation: Secondary | ICD-10-CM | POA: Diagnosis not present

## 2022-06-03 DIAGNOSIS — I110 Hypertensive heart disease with heart failure: Secondary | ICD-10-CM | POA: Diagnosis not present

## 2022-06-05 ENCOUNTER — Telehealth: Payer: Self-pay | Admitting: Family Medicine

## 2022-06-05 DIAGNOSIS — I4891 Unspecified atrial fibrillation: Secondary | ICD-10-CM | POA: Diagnosis not present

## 2022-06-05 DIAGNOSIS — I77811 Abdominal aortic ectasia: Secondary | ICD-10-CM | POA: Diagnosis not present

## 2022-06-05 DIAGNOSIS — J449 Chronic obstructive pulmonary disease, unspecified: Secondary | ICD-10-CM | POA: Diagnosis not present

## 2022-06-05 DIAGNOSIS — D509 Iron deficiency anemia, unspecified: Secondary | ICD-10-CM | POA: Diagnosis not present

## 2022-06-05 DIAGNOSIS — I5020 Unspecified systolic (congestive) heart failure: Secondary | ICD-10-CM | POA: Diagnosis not present

## 2022-06-05 DIAGNOSIS — I110 Hypertensive heart disease with heart failure: Secondary | ICD-10-CM | POA: Diagnosis not present

## 2022-06-05 MED ORDER — IRBESARTAN 300 MG PO TABS: 300.0000 mg | ORAL_TABLET | Freq: Every day | ORAL | 0 refills | Status: DC

## 2022-06-05 NOTE — Telephone Encounter (Signed)
#  14 sent to local pharmacy.

## 2022-06-05 NOTE — Telephone Encounter (Signed)
FYI

## 2022-06-05 NOTE — Telephone Encounter (Signed)
Enrique Sack with [facility] ph# [phone] called regarding Patient requested early discharge from Physical Therapy and only wants to keep up with OT. Enrique Sack states Patient is still a very high risk for falls.

## 2022-06-05 NOTE — Telephone Encounter (Signed)
Caller requests a smaller RX of the following med be sent to (until mail order RX can be received): [id number] - [place], [place] - [address] AT [facility] [address] Phone:  [phone]  Fax:  [phone]     Encourage patient to contact the pharmacy for refills or they can request refills through [facility]  LAST APPOINTMENT DATE:  Please schedule appointment if longer than 1 year-05/30/22  NEXT APPOINTMENT DATE:08/30/22  MEDICATION:irbesartan (AVAPRO) 300 MG tablet  Is the patient out of medication? Yes  PHARMACY: [facility] - [place], [place] - [address] Phone:  [phone]  Fax:  [phone]      Let patient know to contact pharmacy at the end of the day to make sure medication is ready.  Please notify patient to allow 48-72 hours to process

## 2022-06-06 NOTE — Telephone Encounter (Signed)
Called and gave pt message, returned to call to [facility] and made aware Dr. Pamala Hurry message and that pt aware of Dr. Durene Cal recommendation. Pt still refused PT and would like to be discharged.

## 2022-06-06 NOTE — Telephone Encounter (Signed)
Since patient is high risk for falls-please call and encouraged her to follow through with physical therapy.  If she refuses she can be discharged from physical therapy

## 2022-06-07 DIAGNOSIS — I4891 Unspecified atrial fibrillation: Secondary | ICD-10-CM | POA: Diagnosis not present

## 2022-06-07 DIAGNOSIS — D509 Iron deficiency anemia, unspecified: Secondary | ICD-10-CM | POA: Diagnosis not present

## 2022-06-07 DIAGNOSIS — J449 Chronic obstructive pulmonary disease, unspecified: Secondary | ICD-10-CM | POA: Diagnosis not present

## 2022-06-07 DIAGNOSIS — I110 Hypertensive heart disease with heart failure: Secondary | ICD-10-CM | POA: Diagnosis not present

## 2022-06-07 DIAGNOSIS — I5020 Unspecified systolic (congestive) heart failure: Secondary | ICD-10-CM | POA: Diagnosis not present

## 2022-06-07 DIAGNOSIS — I77811 Abdominal aortic ectasia: Secondary | ICD-10-CM | POA: Diagnosis not present

## 2022-06-08 ENCOUNTER — Emergency Department (HOSPITAL_COMMUNITY): Payer: Medicare Other

## 2022-06-08 ENCOUNTER — Other Ambulatory Visit: Payer: Self-pay

## 2022-06-08 ENCOUNTER — Encounter (HOSPITAL_COMMUNITY): Payer: Self-pay

## 2022-06-08 ENCOUNTER — Emergency Department (HOSPITAL_COMMUNITY)
Admission: EM | Admit: 2022-06-08 | Discharge: 2022-06-08 | Disposition: A | Discharge status: Home / Self Care | Payer: Medicare Other | Attending: Emergency Medicine | Admitting: Emergency Medicine

## 2022-06-08 DIAGNOSIS — Z79899 Other long term (current) drug therapy: Secondary | ICD-10-CM | POA: Insufficient documentation

## 2022-06-08 DIAGNOSIS — Z7951 Long term (current) use of inhaled steroids: Secondary | ICD-10-CM | POA: Insufficient documentation

## 2022-06-08 DIAGNOSIS — I110 Hypertensive heart disease with heart failure: Secondary | ICD-10-CM | POA: Insufficient documentation

## 2022-06-08 DIAGNOSIS — I509 Heart failure, unspecified: Secondary | ICD-10-CM | POA: Insufficient documentation

## 2022-06-08 DIAGNOSIS — R55 Syncope and collapse: Secondary | ICD-10-CM | POA: Insufficient documentation

## 2022-06-08 DIAGNOSIS — J449 Chronic obstructive pulmonary disease, unspecified: Secondary | ICD-10-CM | POA: Diagnosis not present

## 2022-06-08 DIAGNOSIS — I4891 Unspecified atrial fibrillation: Secondary | ICD-10-CM | POA: Insufficient documentation

## 2022-06-08 DIAGNOSIS — R42 Dizziness and giddiness: Secondary | ICD-10-CM | POA: Diagnosis not present

## 2022-06-08 DIAGNOSIS — E860 Dehydration: Secondary | ICD-10-CM | POA: Diagnosis not present

## 2022-06-08 DIAGNOSIS — I959 Hypotension, unspecified: Secondary | ICD-10-CM | POA: Diagnosis not present

## 2022-06-08 DIAGNOSIS — Z7901 Long term (current) use of anticoagulants: Secondary | ICD-10-CM | POA: Insufficient documentation

## 2022-06-08 DIAGNOSIS — I10 Essential (primary) hypertension: Secondary | ICD-10-CM

## 2022-06-08 DIAGNOSIS — R531 Weakness: Secondary | ICD-10-CM | POA: Diagnosis not present

## 2022-06-08 LAB — COMPREHENSIVE METABOLIC PANEL
ALT: 11 U/L (ref 0–44)
AST: 17 U/L (ref 15–41)
Albumin: 3.5 g/dL (ref 3.5–5.0)
Alkaline Phosphatase: 78 U/L (ref 38–126)
Anion gap: 10 (ref 5–15)
BUN: 31 mg/dL — ABNORMAL HIGH (ref 8–23)
CO2: 25 mmol/L (ref 22–32)
Calcium: 8.5 mg/dL — ABNORMAL LOW (ref 8.9–10.3)
Chloride: 107 mmol/L (ref 98–111)
Creatinine, Ser: 1.19 mg/dL — ABNORMAL HIGH (ref 0.44–1.00)
GFR, Estimated: 44 mL/min — ABNORMAL LOW (ref >60)
Glucose, Bld: 119 mg/dL — ABNORMAL HIGH (ref 70–99)
Potassium: 3.6 mmol/L (ref 3.5–5.1)
Sodium: 142 mmol/L (ref 135–145)
Total Bilirubin: 0.6 mg/dL (ref 0.3–1.2)
Total Protein: 7.1 g/dL (ref 6.5–8.1)

## 2022-06-08 LAB — CBC WITH DIFFERENTIAL/PLATELET
Abs Immature Granulocytes: 0.02 10*3/uL (ref 0.00–0.07)
Basophils Absolute: 0.1 10*3/uL (ref 0.0–0.1)
Basophils Relative: 1 %
Eosinophils Absolute: 0.2 10*3/uL (ref 0.0–0.5)
Eosinophils Relative: 3 %
HCT: 38.1 % (ref 36.0–46.0)
Hemoglobin: 11.9 g/dL — ABNORMAL LOW (ref 12.0–15.0)
Immature Granulocytes: 0 %
Lymphocytes Relative: 29 %
Lymphs Abs: 1.9 10*3/uL (ref 0.7–4.0)
MCH: 29.7 pg (ref 26.0–34.0)
MCHC: 31.2 g/dL (ref 30.0–36.0)
MCV: 95 fL (ref 80.0–100.0)
Monocytes Absolute: 0.7 10*3/uL (ref 0.1–1.0)
Monocytes Relative: 10 %
Neutro Abs: 3.8 10*3/uL (ref 1.7–7.7)
Neutrophils Relative %: 57 %
Platelets: 197 10*3/uL (ref 150–400)
RBC: 4.01 MIL/uL (ref 3.87–5.11)
RDW: 14.7 % (ref 11.5–15.5)
WBC: 6.6 10*3/uL (ref 4.0–10.5)
nRBC: 0 % (ref 0.0–0.2)

## 2022-06-08 LAB — BRAIN NATRIURETIC PEPTIDE: B Natriuretic Peptide: 80 pg/mL (ref 0.0–100.0)

## 2022-06-08 LAB — TROPONIN I (HIGH SENSITIVITY)
Troponin I (High Sensitivity): 11 ng/L (ref <18)
Troponin I (High Sensitivity): 10 ng/L (ref <18)

## 2022-06-08 LAB — CBG MONITORING, ED: Glucose-Capillary: 133 mg/dL — ABNORMAL HIGH (ref 70–99)

## 2022-06-08 NOTE — ED Triage Notes (Signed)
Pt BIB EMS from home. Pt and pt son state pt stood up, turned pale, and son caught pt before she could fall. Pt and pt son deny LOC. Pt has hx of A Fib.  160/90 standing, dropped to 118/78 systolic Pt states she does not drink a lot of fluids. Pt has hx of CHF and has had similar episodes in the past. Pt denies chest pain, denies SOB.

## 2022-06-08 NOTE — ED Notes (Signed)
Patient verbalizes understanding of discharge instructions. Opportunity for questioning and answers were provided. Armband removed by staff, pt discharged from ED to home via POV  

## 2022-06-08 NOTE — ED Provider Notes (Signed)
[facility]-EMERGENCY DEPT Provider Note   CSN: [id number] Arrival date & time: 06/08/22  1558     History  Chief Complaint  Patient presents with   Near Syncope    Cheryl Atkinson is a 88 y.o. female.  Presenting to the ER due to concern for episode of feeling lightheaded.  She felt like she was going to pass out but did not pass out.  States that earlier in the day she was feeling fine, she was not having shortness of breath and chest pain.  She now states that she feels fine and continues to deny any recurrent episodes of feeling lightheaded.  Additional history obtained from chart review, reviewed recent primary care office visit, Dr. Durene Cal, 05/30/2022 has history of A-fib, CHF with preserved EF, hypertension, restless leg syndrome, hyperlipidemia, peripheral vascular disease, COPD   HPI     Home Medications Prior to Admission medications   Medication Sig Start Date End Date Taking? Authorizing Provider  Albuterol Sulfate (PROAIR RESPICLICK) 108 (90 Base) MCG/ACT AEPB Inhale 2 puffs into the lungs every 6 (six) hours as needed (shortness of breath from COPD). 12/04/20   Shelva Majestic, MD  apixaban (ELIQUIS) 2.5 MG TABS tablet Take 1 tablet (2.5 mg total) by mouth 2 (two) times daily. 01/23/22   Shelva Majestic, MD  CALCIUM PO Take by mouth.    [provider]  carbidopa-levodopa (SINEMET IR) 25-100 MG tablet Take daily as needed for worsening RLS. 11/12/21   Nita Sickle K, DO  Cholecalciferol (VITAMIN D3 PO) Take by mouth.    [provider]  ferrous sulfate 325 (65 FE) MG tablet Take 325 mg by mouth 2 (two) times a week. Patient takes one tablet on Monday and Friday    [provider]  furosemide (LASIX) 40 MG tablet TAKE 1 TABLET TWICE A DAY 11/13/21   Shelva Majestic, MD  irbesartan (AVAPRO) 300 MG tablet Take 1 tablet (300 mg total) by mouth daily. 06/05/22   Shelva Majestic, MD  LINZESS 145 MCG CAPS capsule Take 145 mcg  by mouth daily as needed (constipation). 02/22/21   [provider]  Multiple Vitamins-Minerals (PRESERVISION AREDS 2 PO) Take 1 tablet by mouth 2 (two) times daily.    [provider]  polyethylene glycol (MIRALAX / GLYCOLAX) 17 g packet Take 17 g by mouth 2 (two) times daily. Reported on 03/14/2016 Patient taking differently: Take 17 g by mouth daily as needed for mild constipation. 01/03/20   Hongalgi, Maximino Greenland, MD  potassium chloride SA (KLOR-CON) 10 MEQ tablet Take 1 tablet (10 mEq total) by mouth 2 (two) times daily as needed. Needs to be taken  With each dose of lasix 09/07/21   Ghimire, Werner Lean, MD  ramelteon (ROZEREM) 8 MG tablet Take 1 tablet (8 mg total) by mouth at bedtime. 30 minutes before bed and no tv once you take this 05/30/22   Shelva Majestic, MD  rosuvastatin (CRESTOR) 40 MG tablet TAKE 1 TABLET DAILY 11/13/21   Shelva Majestic, MD  Rotigotine (NEUPRO) 3 MG/24HR PT24 PLACE 1 PATCH (3 MG) ONTO THE SKIN AT BEDTIME Patient taking differently: Place 3 mg onto the skin at bedtime. 12/04/20   Shelva Majestic, MD  traMADol (ULTRAM) 50 MG tablet Take 1 tablet (50 mg total) by mouth every 6 (six) hours as needed. 10/29/21   Willow Ora, MD  vitamin B-12 (CYANOCOBALAMIN) 1000 MCG tablet Take 1,000 mcg by mouth daily.  [provider]      Allergies    Codeine    Review of Systems   Review of Systems  Physical Exam Updated Vital Signs BP (!) 195/88   Pulse 60   Temp 98 F (36.7 C) (Oral)   Resp 19   Ht 5\' 6"  (1.676 m)   Wt 65.8 kg   LMP  (LMP Unknown)   SpO2 97%   BMI 23.40 kg/m  Physical Exam  ED Results / Procedures / Treatments   Labs (all labs ordered are listed, but only abnormal results are displayed) Labs Reviewed  CBC WITH DIFFERENTIAL/PLATELET - Abnormal; Notable for the following components:      Result Value   Hemoglobin 11.9 (*)    All other components within normal limits  COMPREHENSIVE METABOLIC PANEL - Abnormal;  Notable for the following components:   Glucose, Bld 119 (*)    BUN 31 (*)    Creatinine, Ser 1.19 (*)    Calcium 8.5 (*)    GFR, Estimated 44 (*)    All other components within normal limits  CBG MONITORING, ED - Abnormal; Notable for the following components:   Glucose-Capillary 133 (*)    All other components within normal limits  BRAIN NATRIURETIC PEPTIDE  URINALYSIS, ROUTINE W REFLEX MICROSCOPIC  TROPONIN I (HIGH SENSITIVITY)  TROPONIN I (HIGH SENSITIVITY)    EKG EKG Interpretation  Date/Time:  Saturday June 08 2022 16:16:07 EDT Ventricular Rate:  66 PR Interval:    QRS Duration: 91 QT Interval:  422 QTC Calculation: 443 R Axis:   -46 Text Interpretation: Atrial fibrillation Left anterior fascicular block Abnormal R-wave progression, late transition Confirmed by 10-13-1978 (Marianna Fuss) on 06/08/2022 8:13:41 PM  Radiology CT Head Wo Contrast  Result Date: 06/08/2022 CLINICAL DATA:  Syncope EXAM: CT HEAD WITHOUT CONTRAST TECHNIQUE: Contiguous axial images were obtained from the base of the skull through the vertex without intravenous contrast. RADIATION DOSE REDUCTION: This exam was performed according to the departmental dose-optimization program which includes automated exposure control, adjustment of the mA and/or kV according to patient size and/or use of iterative reconstruction technique. COMPARISON:  CT brain 08/29/2021 FINDINGS: Brain: No acute territorial infarction, hemorrhage, or intracranial mass. Atrophy and chronic small vessel ischemic changes of the white matter. Age indeterminate lacunar infarct in the left thalamus, axial series 2, image 16, sagittal series 5, image 35. The ventricles are stable in size Vascular: No hyperdense vessels.  Carotid vascular calcification Skull: Normal. Negative for fracture or focal lesion. Sinuses/Orbits: No acute finding. Other: None IMPRESSION: 1. Age indeterminate hypodensity/possible lacunar infarct in the left thalamus. Otherwise  no CT evidence for acute intracranial abnormality. 2. Atrophy and chronic small vessel ischemic changes of the white matter Electronically Signed   By: 08/31/2021 M.D.   On: 06/08/2022 17:29   DG Chest 2 View  Result Date: 06/08/2022 CLINICAL DATA:  Weakness, near syncope EXAM: CHEST - 2 VIEW COMPARISON:  08/29/2021 FINDINGS: Mild cardiomegaly. Unchanged elevation of the left hemidiaphragm. Incidental, definitively benign enchondroma of the proximal right humerus. IMPRESSION: Mild cardiomegaly. Unchanged elevation of the left hemidiaphragm without acute abnormality of the lungs. Electronically Signed   By: 08/31/2021 M.D.   On: 06/08/2022 17:24    Procedures Procedures    Medications Ordered in ED Medications - No data to display  ED Course/ Medical Decision Making/ A&P  Medical Decision Making  88 year old lady presented to the ER due to concern for near syncopal episode at home.  Occurred while Going from sitting to standing position.  She has not had any recurrence of near syncope/lightheadedness.  She now denies any ongoing medical complaints and appears well.  No anemia, no electrolyte derangement.  Her EKG demonstrates A-fib.  Her cardiac monitor was reviewed and she appears to be in atrial fibrillation consistently.  Rate is controlled.  Troponin x2 is within normal limits, doubt ACS.  Blood pressure is noted to be elevated.  Patient however is currently asymptomatic.  Given the work-up today, no ongoing symptoms, feel she is stable for discharge and outpatient management.  I advised following up with her primary care doctor as well as her cardiologist to discuss this episode as well as management of her blood pressure.  Reviewed return precautions and discharge.  Additional history obtained from chart review, reviewed recent primary care office visit, Dr. Durene Cal, 05/30/2022 has history of A-fib, CHF with preserved EF, hypertension, restless leg syndrome,  hyperlipidemia, peripheral vascular disease, COPD         Final Clinical Impression(s) / ED Diagnoses Final diagnoses:  Near syncope  Hypertension, unspecified type    Rx / DC Orders ED Discharge Orders     None         Milagros Loll, MD 06/08/22 2135

## 2022-06-08 NOTE — ED Provider Triage Note (Signed)
Emergency Medicine Provider Triage Evaluation Note  Cheryl Atkinson , a 88 y.o. female  was evaluated in triage.  Pt complains of weakness and near syncope. The patient reports that she stood up today and she felt weak and lightheaded like she was going to pass out. No actual syncope. She required assistance ambulating. This was today 1400. The patient reports that she felt ok earlier. She reports she has some chest pain while this was happening, but denies any chest pain or SOB now. No fall. Denies any head injury.   Review of Systems  Positive:  Negative:   Physical Exam  BP (!) 157/74   Pulse 73   Temp 98 F (36.7 C) (Oral)   Resp 18   LMP  (LMP Unknown)   SpO2 95%  Gen:   Awake, no distress   Resp:  Normal effort  MSK:   Moves extremities without difficulty  Other:  Does not appear to be fluid overloaded. No edema to bilateral legs. Cranial nerves grossly intact. Strength equal in upper and lower extremities. Sensation intact. Answering questions appropriately with appropriate speech. Patient has irregularly irregular rhythm.   Medical Decision Making  Medically screening exam initiated at 4:47 PM.  Appropriate orders placed.  Cheryl Atkinson was informed that the remainder of the evaluation will be completed by another provider, this initial triage assessment does not replace that evaluation, and the importance of remaining in the ED until their evaluation is complete.  Labs and imaging ordered.    Achille Rich, [place] 06/08/22 1654

## 2022-06-08 NOTE — Discharge Instructions (Signed)
Please follow-up with your primary doctor and your cardiologist to discuss your symptoms from today and to discuss blood pressure management.  Come back to ER if you have any recurrent episodes of lightheadedness, passing out, chest pain or difficulty in breathing.

## 2022-06-10 ENCOUNTER — Ambulatory Visit (INDEPENDENT_AMBULATORY_CARE_PROVIDER_SITE_OTHER): Payer: Medicare Other | Admitting: Family Medicine

## 2022-06-10 ENCOUNTER — Telehealth: Payer: Self-pay | Admitting: Family Medicine

## 2022-06-10 ENCOUNTER — Encounter: Payer: Self-pay | Admitting: Family Medicine

## 2022-06-10 VITALS — BP 132/67 | HR 63 | Temp 97.2°F | Ht 66.0 in | Wt 145.0 lb

## 2022-06-10 DIAGNOSIS — J449 Chronic obstructive pulmonary disease, unspecified: Secondary | ICD-10-CM | POA: Diagnosis not present

## 2022-06-10 DIAGNOSIS — I5032 Chronic diastolic (congestive) heart failure: Secondary | ICD-10-CM

## 2022-06-10 DIAGNOSIS — I4891 Unspecified atrial fibrillation: Secondary | ICD-10-CM | POA: Diagnosis not present

## 2022-06-10 DIAGNOSIS — I701 Atherosclerosis of renal artery: Secondary | ICD-10-CM | POA: Diagnosis not present

## 2022-06-10 DIAGNOSIS — I10 Essential (primary) hypertension: Secondary | ICD-10-CM

## 2022-06-10 DIAGNOSIS — D509 Iron deficiency anemia, unspecified: Secondary | ICD-10-CM | POA: Diagnosis not present

## 2022-06-10 DIAGNOSIS — I77811 Abdominal aortic ectasia: Secondary | ICD-10-CM | POA: Diagnosis not present

## 2022-06-10 DIAGNOSIS — I110 Hypertensive heart disease with heart failure: Secondary | ICD-10-CM | POA: Diagnosis not present

## 2022-06-10 DIAGNOSIS — I5020 Unspecified systolic (congestive) heart failure: Secondary | ICD-10-CM | POA: Diagnosis not present
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
## 2022-06-10 NOTE — Telephone Encounter (Signed)
Cheryl Atkinson with [facility] called regarding Patient's blood pressure was 162/104 on 06/07/22 and 182/96 this morning (06/10/22). Patient was scheduled today at 4 pm.

## 2022-06-10 NOTE — Telephone Encounter (Signed)
FYI, pt has OV today.

## 2022-06-10 NOTE — Patient Instructions (Addendum)
Stress is causing significant variations in your blood pressure - setting firmer boundaries about the care of your home and the self care of people in your home sounds like the appropriate next step  No changes in medicine today  Schedule follow up with cardiology  Recommended follow up: Return for next already scheduled visit or sooner if needed.

## 2022-06-10 NOTE — Telephone Encounter (Signed)
We will check on her at visit- if she develops symptoms like chest pain or shortness of breath should seek care asap

## 2022-06-11 ENCOUNTER — Telehealth: Payer: Medicare Other

## 2022-06-12 ENCOUNTER — Telehealth: Payer: Self-pay | Admitting: Family Medicine

## 2022-06-12 ENCOUNTER — Other Ambulatory Visit: Payer: Self-pay

## 2022-06-12 MED ORDER — IRBESARTAN 300 MG PO TABS: 300.0000 mg | ORAL_TABLET | Freq: Every day | ORAL | 3 refills | Status: DC

## 2022-06-12 NOTE — Telephone Encounter (Signed)
Caller states orders were sent over 06/23 and calling for an update.  FO Rep had caller resend.   Fax printed and placed in providers box.

## 2022-06-13 DIAGNOSIS — I5020 Unspecified systolic (congestive) heart failure: Secondary | ICD-10-CM | POA: Diagnosis not present

## 2022-06-13 DIAGNOSIS — J449 Chronic obstructive pulmonary disease, unspecified: Secondary | ICD-10-CM | POA: Diagnosis not present

## 2022-06-13 DIAGNOSIS — D509 Iron deficiency anemia, unspecified: Secondary | ICD-10-CM | POA: Diagnosis not present

## 2022-06-13 DIAGNOSIS — I110 Hypertensive heart disease with heart failure: Secondary | ICD-10-CM | POA: Diagnosis not present

## 2022-06-13 DIAGNOSIS — I77811 Abdominal aortic ectasia: Secondary | ICD-10-CM | POA: Diagnosis not present

## 2022-06-13 DIAGNOSIS — I4891 Unspecified atrial fibrillation: Secondary | ICD-10-CM | POA: Diagnosis not present

## 2022-06-13 NOTE — Telephone Encounter (Signed)
Returned call to [facility] and she states she has received the fax.

## 2022-06-19 ENCOUNTER — Telehealth: Payer: Self-pay | Admitting: Family Medicine

## 2022-06-19 NOTE — Telephone Encounter (Signed)
..   Encourage patient to contact the pharmacy for refills or they can request refills through [facility]  LAST APPOINTMENT DATE: 06/10/22  NEXT APPOINTMENT DATE: 07/30/22  MEDICATION:  irbesartan (AVAPRO) 300 MG tablet   Is the patient out of medication? Has 4 pills left  PHARMACY: [id number] [place], [place] - [address] AT [facility] OF [address] [place] [address]  Phone:  [phone]  Fax:  [phone]  DEA #:  [id number]  Patient states [facility] informed her that it takes 24-48 hours to process Rx and 5-7 days to mail. States that at that point she would have been without medication for a few days.

## 2022-06-20 DIAGNOSIS — I5020 Unspecified systolic (congestive) heart failure: Secondary | ICD-10-CM | POA: Diagnosis not present

## 2022-06-20 DIAGNOSIS — I4891 Unspecified atrial fibrillation: Secondary | ICD-10-CM | POA: Diagnosis not present

## 2022-06-20 DIAGNOSIS — I110 Hypertensive heart disease with heart failure: Secondary | ICD-10-CM | POA: Diagnosis not present

## 2022-06-20 DIAGNOSIS — D509 Iron deficiency anemia, unspecified: Secondary | ICD-10-CM | POA: Diagnosis not present

## 2022-06-20 DIAGNOSIS — J449 Chronic obstructive pulmonary disease, unspecified: Secondary | ICD-10-CM | POA: Diagnosis not present

## 2022-06-20 DIAGNOSIS — I77811 Abdominal aortic ectasia: Secondary | ICD-10-CM | POA: Diagnosis not present

## 2022-06-20 MED ORDER — IRBESARTAN 300 MG PO TABS: 300.0000 mg | ORAL_TABLET | Freq: Every day | ORAL | 0 refills | Status: DC

## 2022-06-20 NOTE — Telephone Encounter (Signed)
Refilled provided #14 to get pt through until [place] arrives.

## 2022-06-24 DIAGNOSIS — Z7901 Long term (current) use of anticoagulants: Secondary | ICD-10-CM | POA: Diagnosis not present

## 2022-06-24 DIAGNOSIS — I5020 Unspecified systolic (congestive) heart failure: Secondary | ICD-10-CM | POA: Diagnosis not present

## 2022-06-24 DIAGNOSIS — Z9181 History of falling: Secondary | ICD-10-CM | POA: Diagnosis not present

## 2022-06-24 DIAGNOSIS — K5909 Other constipation: Secondary | ICD-10-CM | POA: Diagnosis not present

## 2022-06-24 DIAGNOSIS — E785 Hyperlipidemia, unspecified: Secondary | ICD-10-CM | POA: Diagnosis not present

## 2022-06-24 DIAGNOSIS — G4700 Insomnia, unspecified: Secondary | ICD-10-CM | POA: Diagnosis not present

## 2022-06-24 DIAGNOSIS — Z9049 Acquired absence of other specified parts of digestive tract: Secondary | ICD-10-CM | POA: Diagnosis not present

## 2022-06-24 DIAGNOSIS — I4891 Unspecified atrial fibrillation: Secondary | ICD-10-CM | POA: Diagnosis not present

## 2022-06-24 DIAGNOSIS — J449 Chronic obstructive pulmonary disease, unspecified: Secondary | ICD-10-CM | POA: Diagnosis not present

## 2022-06-24 DIAGNOSIS — Z87891 Personal history of nicotine dependence: Secondary | ICD-10-CM | POA: Diagnosis not present

## 2022-06-24 DIAGNOSIS — Z8616 Personal history of COVID-19: Secondary | ICD-10-CM | POA: Diagnosis not present

## 2022-06-24 DIAGNOSIS — M48061 Spinal stenosis, lumbar region without neurogenic claudication: Secondary | ICD-10-CM | POA: Diagnosis not present

## 2022-06-24 DIAGNOSIS — I701 Atherosclerosis of renal artery: Secondary | ICD-10-CM | POA: Diagnosis not present

## 2022-06-24 DIAGNOSIS — G20 Parkinson's disease: Secondary | ICD-10-CM | POA: Diagnosis not present

## 2022-06-24 DIAGNOSIS — I110 Hypertensive heart disease with heart failure: Secondary | ICD-10-CM | POA: Diagnosis not present

## 2022-06-24 DIAGNOSIS — G2581 Restless legs syndrome: Secondary | ICD-10-CM | POA: Diagnosis not present

## 2022-06-24 DIAGNOSIS — F329 Major depressive disorder, single episode, unspecified: Secondary | ICD-10-CM | POA: Diagnosis not present

## 2022-06-24 DIAGNOSIS — I77811 Abdominal aortic ectasia: Secondary | ICD-10-CM | POA: Diagnosis not present

## 2022-06-24 DIAGNOSIS — E042 Nontoxic multinodular goiter: Secondary | ICD-10-CM | POA: Diagnosis not present

## 2022-06-24 DIAGNOSIS — D509 Iron deficiency anemia, unspecified: Secondary | ICD-10-CM | POA: Diagnosis not present

## 2022-06-24 DIAGNOSIS — Z87440 Personal history of urinary (tract) infections: Secondary | ICD-10-CM | POA: Diagnosis not present

## 2022-06-24 DIAGNOSIS — K219 Gastro-esophageal reflux disease without esophagitis: Secondary | ICD-10-CM | POA: Diagnosis not present

## 2022-06-25 ENCOUNTER — Encounter (INDEPENDENT_AMBULATORY_CARE_PROVIDER_SITE_OTHER): Payer: Medicare Other | Admitting: Ophthalmology

## 2022-06-25 DIAGNOSIS — H35033 Hypertensive retinopathy, bilateral: Secondary | ICD-10-CM

## 2022-06-25 DIAGNOSIS — H353221 Exudative age-related macular degeneration, left eye, with active choroidal neovascularization: Secondary | ICD-10-CM | POA: Diagnosis not present

## 2022-06-25 DIAGNOSIS — H353112 Nonexudative age-related macular degeneration, right eye, intermediate dry stage: Secondary | ICD-10-CM

## 2022-06-25 DIAGNOSIS — I10 Essential (primary) hypertension: Secondary | ICD-10-CM

## 2022-06-25 DIAGNOSIS — H43813 Vitreous degeneration, bilateral: Secondary | ICD-10-CM | POA: Diagnosis not present

## 2022-06-26 NOTE — Progress Notes (Addendum)
Cardiology Office Note:    Date:  06/27/2022   ID:  Cheryl Atkinson, DOB May 23, 1931, MRN [id number]  PCP:  Shelva Majestic, MD [facility] Cardiologist: [name], MD   Reason for visit: Hypertension  History of Present Illness:    Cheryl Atkinson is a 88 y.o. female with a hx of PAD status post stenting to the right common iliac artery, DES to right SFA 2016, former tobacco use, hypertension, hyperlipidemia, COPD, history of  perihepatic/subdiaphragmatic abscess with VRE, atrial fibrillation, MR, atrial shunt, Parkinson's disease, restless leg syndrome.  Dr. Allyson Sabal last saw in 2021.  She was walking minimally with a walker and denied claudication.  She made appointment today to follow-up on high blood pressure.  She did see her primary care for the same on June 10, 2022.  It was noted that blood pressure was well controlled until patient had a confrontation with her son.  Patient went to the ED since July 8 with lightheadedness and blood pressure 195/88.  She was in rate controlled A-fib, troponins negative.  Today, patient states repeated blood pressures 160s-170s/80s-90s with associated headaches.  She does have intermittent blood pressures in 130s over 60s-80s.  Patient has a cousin that is her caregiver during the day.  Her son stays with her at night.  There has been some recent stress with her son which they state is temporarily improved.  Otherwise, patient denies chest pain.  She has stable dyspnea on exertion.  No significant leg edema, PND or orthopnea.  They state that recently her Lasix was decreased from 40 mg twice a day to once daily dosing -no issues with fluid retention thus far.  She states mild calf claudication with ambulation.  She denies rest leg pain and active ulcers.    Past Medical History:  Diagnosis Date   Acute cholecystitis 12/29/2019   Anemia    Arthritis    "shoulders" (05/04/2015)   Cellulitis of right lower extremity 11/27/2013   CHF  (congestive heart failure) (HCC)    Chronic lower back pain    Constipation    COPD (chronic obstructive pulmonary disease) (HCC)    GERD (gastroesophageal reflux disease)    History of hiatal hernia    HLD (hyperlipidemia)    Hypertension    Insomnia    Osteoporosis    Peripheral arterial disease (HCC)    Restless leg syndrome    Thyroid disease     Past Surgical History:  Procedure Laterality Date   ABDOMINAL AORTAGRAM  05/04/2015   Procedure: Abdominal Aortagram;  Surgeon: Runell Gess, MD;  Location: [facility] INVASIVE CV LAB;  Service: Cardiovascular;;   APPENDECTOMY     CATARACT EXTRACTION W/ INTRAOCULAR LENS  IMPLANT, BILATERAL Bilateral    CHOLECYSTECTOMY N/A 04/04/2020   Procedure: LAPAROSCOPIC CHOLECYSTECTOMY;  Surgeon: Violeta Gelinas, MD;  Location: [facility] OR;  Service: General;  Laterality: N/A;   I & D EXTREMITY Right 12/22/2016   Procedure: IRRIGATION AND DEBRIDEMENT EXTREMITY;  Surgeon: Mack Hook, MD;  Location: [facility] OR;  Service: Orthopedics;  Laterality: Right;   IR EXCHANGE BILIARY DRAIN  03/13/2020   IR PATIENT EVAL TECH 0-60 MINS  12/30/2019   IR PERC CHOLECYSTOSTOMY  01/27/2020   IR RADIOLOGIST EVAL & MGMT  05/16/2020   PERIPHERAL VASCULAR CATHETERIZATION N/A 05/04/2015   Procedure: Lower Extremity Angiography;  Surgeon: Runell Gess, MD;  Location: [facility] INVASIVE CV LAB;  Service: Cardiovascular;  Laterality: N/A;   PERIPHERAL VASCULAR CATHETERIZATION  05/04/2015   Procedure:  Peripheral Vascular Intervention;  Surgeon: Runell Gess, MD;  Location: [facility] INVASIVE CV LAB;  Service: Cardiovascular;;  RCIA - 7x22 ICAST   PERIPHERAL VASCULAR CATHETERIZATION Right 09/04/2015   Procedure: Peripheral Vascular Atherectomy;  Surgeon: Runell Gess, MD;  Location: [facility]VAS[facility] CV LAB;  Service: Cardiovascular;  Laterality: Right;  SFA   sfa Right 09/04/2015   de balloon   THYROID SURGERY Right ?2013   "had goiter taken off my neck"   TONSILLECTOMY     VAGINAL HYSTERECTOMY       Current Medications: Current Meds  Medication Sig   amLODipine (NORVASC) 5 MG tablet Take 1 tablet (5 mg total) by mouth daily.     Allergies:   Codeine   Social History   Socioeconomic History   Marital status: Widowed    Spouse name: Not on file   Number of children: Not on file   Years of education: Not on file   Highest education level: Not on file  Occupational History   Occupation: retired  Tobacco Use   Smoking status: Former    Packs/day: 0.50    Years: 60.00    Total pack years: 30.00    Types: Cigarettes    Quit date: 04/02/2015    Years since quitting: 7.2   Smokeless tobacco: Never  Vaping Use   Vaping Use: Never used  Substance and Sexual Activity   Alcohol use: No   Drug use: No   Sexual activity: Not Currently  Other Topics Concern   Not on file  Social History Narrative   ** Merged History Encounter ** Widowed 2013. 2 sons. 1 grandchild.    Son can help on weekends. Sister and sister in law could help as well. Thinks she may stop driving in next few years.       Retired from [employer].       Hobbies: time at home and with family      HCPOA: sister-in-law and brother. Deirdre Evener.       DNR/DNI      Regular exercise: none   Caffeine use: cup of coffee    Lives in a one story home    Right Handed    Social Determinants of Health   Financial Resource Strain: Low Risk  (07/20/2021)   Overall Financial Resource Strain (CARDIA)    Difficulty of Paying Living Expenses: Not hard at all  Food Insecurity: No Food Insecurity (07/20/2021)   Hunger Vital Sign    Worried About Running Out of Food in the Last Year: Never true    Ran Out of Food in the Last Year: Never true  Transportation Needs: No Transportation Needs (07/20/2021)   PRAPARE - Administrator, Civil Service (Medical): No    Lack of Transportation (Non-Medical): No  Physical Activity: Inactive (07/20/2021)   Exercise Vital Sign    Days of Exercise per Week: 0 days     Minutes of Exercise per Session: 0 min  Stress: No Stress Concern Present (07/20/2021)   [employer] of Occupational Health - Occupational Stress Questionnaire    Feeling of Stress : Not at all  Social Connections: Moderately Isolated (07/20/2021)   Social Connection and Isolation Panel [NHANES]    Frequency of Communication with Friends and Family: More than three times a week    Frequency of Social Gatherings with Friends and Family: Not on file    Attends Religious Services: 1 to 4 times per year    Active Member  of Clubs or Organizations: No    Attends Banker Meetings: Never    Marital Status: Widowed     Family History: The patient's family history includes Atrial fibrillation in her sister; Cancer in her paternal grandmother and sister; Coronary artery disease in an other family member; Heart attack in her father; Heart disease in her father and mother; Stroke in her maternal grandmother.  ROS:   Please see the history of present illness.     EKGs/Labs/Other Studies Reviewed:    EKG:  The ekg ordered today demonstrates Atrial fibrillation with heart rate 72.  Recent Labs: 09/03/2021: Magnesium 2.5 09/04/2021: TSH 1.157 06/08/2022: ALT 11; B Natriuretic Peptide 80.0; BUN 31; Creatinine, Ser 1.19; Hemoglobin 11.9; Platelets 197; Potassium 3.6; Sodium 142   Recent Lipid Panel Lab Results  Component Value Date/Time   CHOL 163 02/12/2022 10:08 AM   CHOL 179 05/06/2018 09:38 AM   TRIG 45.0 02/12/2022 10:08 AM   HDL 86.70 02/12/2022 10:08 AM   HDL 92 05/06/2018 09:38 AM   LDLCALC 68 02/12/2022 10:08 AM   LDLCALC 73 05/06/2018 09:38 AM   LDLDIRECT 60.0 10/09/2021 12:13 PM    Physical Exam:    VS:  BP (!) 176/88   Pulse 72   Ht 5\' 6"  (1.676 m)   Wt 145 lb (65.8 kg)   LMP  (LMP Unknown)   SpO2 98%   BMI 23.40 kg/m    No data found.  Wt Readings from Last 3 Encounters:  06/27/22 145 lb (65.8 kg)  06/10/22 145 lb (65.8 kg)  06/08/22 145 lb (65.8 kg)      GEN:  Well nourished, well developed in no acute distress HEENT: Normal NECK: No JVD; No carotid bruits CARDIAC: Irregular regular RESPIRATORY:  Clear to auscultation without rales, wheezing or rhonchi  ABDOMEN: Soft, non-tender, non-distended MUSCULOSKELETAL: No edema VASCULAR: Strong right pedal pulse SKIN: Warm and dry; no ulcers NEUROLOGIC:  Alert and oriented PSYCHIATRIC:  Normal affect     ASSESSMENT AND PLAN   Hypertension, BP elevated -Continue Avapro 300 mg daily.  Creatinine 1.19 in July 2023.  Baseline creatinine 0.8-1.2. -Add amlodipine 5 mg half tablet once daily.  Monitor blood pressure for 1 week.  If systolic blood pressure persistently over 140, increase amlodipine to 5 mg 1 whole tablet daily. -Discussed okay to have permissive blood pressure 130s to 150s given her age.  We are just trying to keep it less than 160 reliably.  Longstanding persistent atrial fibrillation -EKG shows A-fib rate controlled. -Does not require AV nodal blockers.   -Continue Eliquis for stroke prevention  PAD, stable -status post right common iliac stenting 05/03/2014; small abdominal aortic aneurysm, an occluded left renal artery and bilateral SFA occlusions.   -S/P DES to right SFA 2016 -Follow-up Dopplers with occluded right SFA -Scheduled for repeat Dopplers next month. -Patient with mild claudication.  No active ulcers or rest pain. -Continue lipid therapy.  Hyperlipidemia  -LDL 68 in March 2023.  Continue Crestor.  Disposition -recommend to send her blood pressure log via MyChart in 3 to 4 weeks.  Follow-up with Dr. April 2023 in 6 months.   Medication Adjustments/Labs and Tests Ordered: Current medicines are reviewed at length with the patient today.  Concerns regarding medicines are outlined above.  Orders Placed This Encounter  Procedures   EKG 12-Lead   Meds ordered this encounter  Medications   amLODipine (NORVASC) 5 MG tablet    Sig: Take 1 tablet (5 mg total) by  mouth daily.    Dispense:  30 tablet    Refill:  5    Patient Instructions  Medication Instructions:  Start Amlodipine 5mg  ( Take 0.5 Tablets for 7 days .  If Systolic Blood Pressure Above Take a Whole Tablet). *If you need a refill on your cardiac medications before your next appointment, please call your pharmacy*   Lab Work: No Labs If you have labs (blood work) drawn today and your tests are completely normal, you will receive your results only by: MyChart Message (if you have MyChart) OR A paper copy in the mail If you have any lab test that is abnormal or we need to change your treatment, we will call you to review the results.   Testing/Procedures: No Testing   Follow-Up: At [facility], you and your health needs are our priority.  As part of our continuing mission to provide you with exceptional heart care, we have created designated Provider Care Teams.  These Care Teams include your primary Cardiologist (physician) and Advanced Practice Providers (APPs -  Physician Assistants and Nurse Practitioners) who all work together to provide you with the care you need, when you need it.  We recommend signing up for the patient portal called "MyChart".  Sign up information is provided on this After Visit Summary.  MyChart is used to connect with patients for Virtual Visits (Telemedicine).  Patients are able to view lab/test results, encounter notes, upcoming appointments, etc.  Non-urgent messages can be sent to your provider as well.   To learn more about what you can do with MyChart, go to [facility].    Your next appointment:   6 month(s)  The format for your next appointment:   In Person  Provider:   [email or web address], MD     Other Instructions Monitor Blood Pressure Daily for 3 Weeks. Send Log via My chart Message).      Signed, [name], PA-C  06/27/2022 11:41 AM    [facility]

## 2022-06-27 ENCOUNTER — Other Ambulatory Visit: Payer: Self-pay

## 2022-06-27 ENCOUNTER — Encounter: Payer: Self-pay | Admitting: Physician Assistant

## 2022-06-27 ENCOUNTER — Ambulatory Visit (INDEPENDENT_AMBULATORY_CARE_PROVIDER_SITE_OTHER): Payer: Medicare Other | Admitting: Physician Assistant

## 2022-06-27 VITALS — BP 176/88 | HR 72 | Ht 66.0 in | Wt 145.0 lb

## 2022-06-27 DIAGNOSIS — I739 Peripheral vascular disease, unspecified: Secondary | ICD-10-CM

## 2022-06-27 DIAGNOSIS — I5020 Unspecified systolic (congestive) heart failure: Secondary | ICD-10-CM | POA: Diagnosis not present

## 2022-06-27 DIAGNOSIS — I4811 Longstanding persistent atrial fibrillation: Secondary | ICD-10-CM | POA: Diagnosis not present

## 2022-06-27 DIAGNOSIS — I110 Hypertensive heart disease with heart failure: Secondary | ICD-10-CM | POA: Diagnosis not present

## 2022-06-27 DIAGNOSIS — I10 Essential (primary) hypertension: Secondary | ICD-10-CM

## 2022-06-27 DIAGNOSIS — D509 Iron deficiency anemia, unspecified: Secondary | ICD-10-CM | POA: Diagnosis not present

## 2022-06-27 DIAGNOSIS — J449 Chronic obstructive pulmonary disease, unspecified: Secondary | ICD-10-CM | POA: Diagnosis not present

## 2022-06-27 DIAGNOSIS — G20 Parkinson's disease: Secondary | ICD-10-CM | POA: Diagnosis not present

## 2022-06-27 DIAGNOSIS — I4891 Unspecified atrial fibrillation: Secondary | ICD-10-CM | POA: Diagnosis not present

## 2022-06-27 MED ORDER — AMLODIPINE BESYLATE 5 MG PO TABS: 5.0000 mg | ORAL_TABLET | Freq: Every day | ORAL | 5 refills | Status: DC

## 2022-06-27 NOTE — Patient Instructions (Addendum)
Medication Instructions:  Start Amlodipine 5mg  ( Take 0.5 Tablets for 7 days .  If Systolic Blood Pressure Above Take a Whole Tablet). *If you need a refill on your cardiac medications before your next appointment, please call your pharmacy*   Lab Work: No Labs If you have labs (blood work) drawn today and your tests are completely normal, you will receive your results only by: MyChart Message (if you have MyChart) OR A paper copy in the mail If you have any lab test that is abnormal or we need to change your treatment, we will call you to review the results.   Testing/Procedures: No Testing   Follow-Up: At [facility], you and your health needs are our priority.  As part of our continuing mission to provide you with exceptional heart care, we have created designated Provider Care Teams.  These Care Teams include your primary Cardiologist (physician) and Advanced Practice Providers (APPs -  Physician Assistants and Nurse Practitioners) who all work together to provide you with the care you need, when you need it.  We recommend signing up for the patient portal called "MyChart".  Sign up information is provided on this After Visit Summary.  MyChart is used to connect with patients for Virtual Visits (Telemedicine).  Patients are able to view lab/test results, encounter notes, upcoming appointments, etc.  Non-urgent messages can be sent to your provider as well.   To learn more about what you can do with MyChart, go to [facility].    Your next appointment:   6 month(s)  The format for your next appointment:   In Person  Provider:   [email or web address], MD     Other Instructions Monitor Blood Pressure Daily for 3 Weeks. Send Log via My chart Message).

## 2022-06-28 ENCOUNTER — Telehealth: Payer: Self-pay | Admitting: Family Medicine

## 2022-06-28 DIAGNOSIS — G20 Parkinson's disease: Secondary | ICD-10-CM | POA: Diagnosis not present

## 2022-06-28 DIAGNOSIS — I110 Hypertensive heart disease with heart failure: Secondary | ICD-10-CM | POA: Diagnosis not present

## 2022-06-28 DIAGNOSIS — I5020 Unspecified systolic (congestive) heart failure: Secondary | ICD-10-CM | POA: Diagnosis not present

## 2022-06-28 DIAGNOSIS — J449 Chronic obstructive pulmonary disease, unspecified: Secondary | ICD-10-CM | POA: Diagnosis not present

## 2022-06-28 DIAGNOSIS — I4891 Unspecified atrial fibrillation: Secondary | ICD-10-CM | POA: Diagnosis not present

## 2022-06-28 DIAGNOSIS — D509 Iron deficiency anemia, unspecified: Secondary | ICD-10-CM | POA: Diagnosis not present

## 2022-06-28 NOTE — Telephone Encounter (Signed)
.  Home Health Certification or Plan of Care Tracking  Is this a Certification or Plan of Care? yes  [facility] Agency: [facility]  Order Number:  [id number]  Has charge sheet been attached? yes  Where has form been placed:  In provider's box  Faxed to:   [phone]

## 2022-06-28 NOTE — Telephone Encounter (Signed)
Noted  

## 2022-07-03 ENCOUNTER — Ambulatory Visit: Payer: Self-pay | Admitting: *Deleted

## 2022-07-03 DIAGNOSIS — I10 Essential (primary) hypertension: Secondary | ICD-10-CM

## 2022-07-03 DIAGNOSIS — I5032 Chronic diastolic (congestive) heart failure: Secondary | ICD-10-CM

## 2022-07-03 NOTE — Patient Instructions (Signed)
Visit Information  Thank you for allowing me to share the care management and care coordination services that are available to you as part of your health plan and services through your primary care provider and medical home. Please reach out to me at [phone] if the care management/care coordination team may be of assistance to you in the future.   Deundra Furber RN, MSN RN Care Management Coordinator  [facility] [phone] [email or web address]  

## 2022-07-03 NOTE — Chronic Care Management (AMB) (Signed)
  Care Management   Follow Up Note   07/03/2022 Name: Cheryl Atkinson MRN: [id number] DOB: 1931/02/13   Referred by: Shelva Majestic, MD Reason for referral : Case Closure   Notice patient cancelled last RNCM scheduled outreach by calling office, did not reschedule appointment.  Will close case at this time.  Follow Up Plan: No further follow up required: as patient declining RNCM OUTREACHES.  Rhae Lerner RN, MSN RN Care Management Coordinator  [facility] [phone] [email or web address]

## 2022-07-11 DIAGNOSIS — J449 Chronic obstructive pulmonary disease, unspecified: Secondary | ICD-10-CM | POA: Diagnosis not present

## 2022-07-11 DIAGNOSIS — I4891 Unspecified atrial fibrillation: Secondary | ICD-10-CM | POA: Diagnosis not present

## 2022-07-11 DIAGNOSIS — I110 Hypertensive heart disease with heart failure: Secondary | ICD-10-CM | POA: Diagnosis not present

## 2022-07-11 DIAGNOSIS — I5020 Unspecified systolic (congestive) heart failure: Secondary | ICD-10-CM | POA: Diagnosis not present

## 2022-07-11 DIAGNOSIS — D509 Iron deficiency anemia, unspecified: Secondary | ICD-10-CM | POA: Diagnosis not present

## 2022-07-11 DIAGNOSIS — G20 Parkinson's disease: Secondary | ICD-10-CM | POA: Diagnosis not present

## 2022-07-12 ENCOUNTER — Ambulatory Visit (HOSPITAL_BASED_OUTPATIENT_CLINIC_OR_DEPARTMENT_OTHER)
Admission: RE | Admit: 2022-07-12 | Discharge: 2022-07-12 | Disposition: A | Discharge status: Home / Self Care | Payer: Medicare Other | Source: Ambulatory Visit | Attending: Physician Assistant | Admitting: Physician Assistant

## 2022-07-12 ENCOUNTER — Telehealth: Payer: Self-pay | Admitting: Family Medicine

## 2022-07-12 ENCOUNTER — Ambulatory Visit (HOSPITAL_COMMUNITY)
Admission: RE | Admit: 2022-07-12 | Discharge: 2022-07-12 | Disposition: A | Discharge status: Home / Self Care | Payer: Medicare Other | Source: Ambulatory Visit | Attending: Cardiovascular Disease | Admitting: Cardiovascular Disease

## 2022-07-12 DIAGNOSIS — D509 Iron deficiency anemia, unspecified: Secondary | ICD-10-CM | POA: Diagnosis not present

## 2022-07-12 DIAGNOSIS — J449 Chronic obstructive pulmonary disease, unspecified: Secondary | ICD-10-CM | POA: Diagnosis not present

## 2022-07-12 DIAGNOSIS — I739 Peripheral vascular disease, unspecified: Secondary | ICD-10-CM

## 2022-07-12 DIAGNOSIS — Z95820 Peripheral vascular angioplasty status with implants and grafts: Secondary | ICD-10-CM

## 2022-07-12 DIAGNOSIS — I4891 Unspecified atrial fibrillation: Secondary | ICD-10-CM | POA: Diagnosis not present

## 2022-07-12 DIAGNOSIS — I110 Hypertensive heart disease with heart failure: Secondary | ICD-10-CM | POA: Diagnosis not present

## 2022-07-12 DIAGNOSIS — G20 Parkinson's disease: Secondary | ICD-10-CM | POA: Diagnosis not present

## 2022-07-12 DIAGNOSIS — I5020 Unspecified systolic (congestive) heart failure: Secondary | ICD-10-CM | POA: Diagnosis not present

## 2022-07-12 NOTE — Telephone Encounter (Signed)
Would prefer less than 140/90 but would like for this to be at least 1 to 2 hours after taking her medication to make sure has time for full effectiveness

## 2022-07-12 NOTE — Telephone Encounter (Signed)
See below

## 2022-07-12 NOTE — Telephone Encounter (Signed)
States visited patient today.    States BP was 156/88 in left arm at 8:15am.   States patient has took medication but it had only  been 30 minutes prior.    States patient has an appt for a scan of her leg and an appt with cardiologist.    Would like to confirm if perimeters are 140/90 and below?  Can leave VM.

## 2022-07-14 DIAGNOSIS — I5020 Unspecified systolic (congestive) heart failure: Secondary | ICD-10-CM | POA: Diagnosis not present

## 2022-07-14 DIAGNOSIS — G20 Parkinson's disease: Secondary | ICD-10-CM | POA: Diagnosis not present

## 2022-07-14 DIAGNOSIS — I4891 Unspecified atrial fibrillation: Secondary | ICD-10-CM | POA: Diagnosis not present

## 2022-07-14 DIAGNOSIS — J449 Chronic obstructive pulmonary disease, unspecified: Secondary | ICD-10-CM | POA: Diagnosis not present

## 2022-07-14 DIAGNOSIS — D509 Iron deficiency anemia, unspecified: Secondary | ICD-10-CM | POA: Diagnosis not present

## 2022-07-14 DIAGNOSIS — I110 Hypertensive heart disease with heart failure: Secondary | ICD-10-CM | POA: Diagnosis not present

## 2022-07-15 NOTE — Telephone Encounter (Signed)
Called and lm on Cheryl Atkinson vm with below message.

## 2022-07-17 ENCOUNTER — Telehealth: Payer: Self-pay

## 2022-07-17 NOTE — Telephone Encounter (Addendum)
Called patient regarding results. Patient had understanding of results.----- Message from Cannon Kettle, PA-C sent at 07/16/2022  7:34 AM EDT ----- Bilateral ABIs appear increased compared to prior study on 07/05/21.  Continue current therapy.

## 2022-07-17 NOTE — Telephone Encounter (Addendum)
Called patient regarding results. Patient had understanding of results----- Message from Cannon Kettle, PA-C sent at 07/16/2022  7:35 AM EDT ----- Patent right common iliac artery, s/p stenting.

## 2022-07-17 NOTE — Telephone Encounter (Addendum)
Called patient regarding results. Patient had understanding of results.----- Message from Cannon Kettle, PA-C sent at 07/16/2022  7:33 AM EDT ----- Patient with hx of DES to right SFA 2016. No significant change compared to previous study. Occluded distal SFA. Continue Crestor and Eliquis.

## 2022-07-18 ENCOUNTER — Other Ambulatory Visit: Payer: Self-pay

## 2022-07-18 MED ORDER — RAMELTEON 8 MG PO TABS
8.0000 mg | ORAL_TABLET | Freq: Every day | ORAL | 3 refills | Status: DC
Start: 2022-07-18 — End: 2022-07-30

## 2022-07-18 MED ORDER — RAMELTEON 8 MG PO TABS: 8.0000 mg | ORAL_TABLET | Freq: Every day | ORAL | 5 refills | Status: DC

## 2022-07-18 NOTE — Progress Notes (Signed)
I attempted to send to the mail order pharmacy-please double check on this

## 2022-07-18 NOTE — Addendum Note (Signed)
Addended by: Shelva Majestic on: 07/18/2022 05:20 PM   Modules accepted: Orders

## 2022-07-19 DIAGNOSIS — I110 Hypertensive heart disease with heart failure: Secondary | ICD-10-CM | POA: Diagnosis not present

## 2022-07-19 DIAGNOSIS — I5020 Unspecified systolic (congestive) heart failure: Secondary | ICD-10-CM | POA: Diagnosis not present

## 2022-07-19 DIAGNOSIS — J449 Chronic obstructive pulmonary disease, unspecified: Secondary | ICD-10-CM | POA: Diagnosis not present

## 2022-07-19 DIAGNOSIS — G20 Parkinson's disease: Secondary | ICD-10-CM | POA: Diagnosis not present

## 2022-07-19 DIAGNOSIS — I4891 Unspecified atrial fibrillation: Secondary | ICD-10-CM | POA: Diagnosis not present

## 2022-07-19 DIAGNOSIS — D509 Iron deficiency anemia, unspecified: Secondary | ICD-10-CM | POA: Diagnosis not present

## 2022-07-22 DIAGNOSIS — I110 Hypertensive heart disease with heart failure: Secondary | ICD-10-CM | POA: Diagnosis not present

## 2022-07-22 DIAGNOSIS — G20 Parkinson's disease: Secondary | ICD-10-CM | POA: Diagnosis not present

## 2022-07-22 DIAGNOSIS — D509 Iron deficiency anemia, unspecified: Secondary | ICD-10-CM | POA: Diagnosis not present

## 2022-07-22 DIAGNOSIS — I5020 Unspecified systolic (congestive) heart failure: Secondary | ICD-10-CM | POA: Diagnosis not present

## 2022-07-22 DIAGNOSIS — I4891 Unspecified atrial fibrillation: Secondary | ICD-10-CM | POA: Diagnosis not present

## 2022-07-22 DIAGNOSIS — J449 Chronic obstructive pulmonary disease, unspecified: Secondary | ICD-10-CM | POA: Diagnosis not present

## 2022-07-23 ENCOUNTER — Encounter (INDEPENDENT_AMBULATORY_CARE_PROVIDER_SITE_OTHER): Payer: Medicare Other | Admitting: Ophthalmology

## 2022-07-23 DIAGNOSIS — H353112 Nonexudative age-related macular degeneration, right eye, intermediate dry stage: Secondary | ICD-10-CM | POA: Diagnosis not present

## 2022-07-23 DIAGNOSIS — I10 Essential (primary) hypertension: Secondary | ICD-10-CM | POA: Diagnosis not present

## 2022-07-23 DIAGNOSIS — H35033 Hypertensive retinopathy, bilateral: Secondary | ICD-10-CM

## 2022-07-23 DIAGNOSIS — H353221 Exudative age-related macular degeneration, left eye, with active choroidal neovascularization: Secondary | ICD-10-CM | POA: Diagnosis not present

## 2022-07-23 DIAGNOSIS — H43813 Vitreous degeneration, bilateral: Secondary | ICD-10-CM | POA: Diagnosis not present

## 2022-07-24 ENCOUNTER — Other Ambulatory Visit: Payer: Self-pay | Admitting: Neurology

## 2022-07-24 DIAGNOSIS — F329 Major depressive disorder, single episode, unspecified: Secondary | ICD-10-CM | POA: Diagnosis not present

## 2022-07-24 DIAGNOSIS — K219 Gastro-esophageal reflux disease without esophagitis: Secondary | ICD-10-CM | POA: Diagnosis not present

## 2022-07-24 DIAGNOSIS — I701 Atherosclerosis of renal artery: Secondary | ICD-10-CM | POA: Diagnosis not present

## 2022-07-24 DIAGNOSIS — J449 Chronic obstructive pulmonary disease, unspecified: Secondary | ICD-10-CM | POA: Diagnosis not present

## 2022-07-24 DIAGNOSIS — Z9049 Acquired absence of other specified parts of digestive tract: Secondary | ICD-10-CM | POA: Diagnosis not present

## 2022-07-24 DIAGNOSIS — I4891 Unspecified atrial fibrillation: Secondary | ICD-10-CM | POA: Diagnosis not present

## 2022-07-24 DIAGNOSIS — E042 Nontoxic multinodular goiter: Secondary | ICD-10-CM | POA: Diagnosis not present

## 2022-07-24 DIAGNOSIS — G2581 Restless legs syndrome: Secondary | ICD-10-CM | POA: Diagnosis not present

## 2022-07-24 DIAGNOSIS — Z87440 Personal history of urinary (tract) infections: Secondary | ICD-10-CM | POA: Diagnosis not present

## 2022-07-24 DIAGNOSIS — I77811 Abdominal aortic ectasia: Secondary | ICD-10-CM | POA: Diagnosis not present

## 2022-07-24 DIAGNOSIS — M48061 Spinal stenosis, lumbar region without neurogenic claudication: Secondary | ICD-10-CM | POA: Diagnosis not present

## 2022-07-24 DIAGNOSIS — Z8616 Personal history of COVID-19: Secondary | ICD-10-CM | POA: Diagnosis not present

## 2022-07-24 DIAGNOSIS — Z9181 History of falling: Secondary | ICD-10-CM | POA: Diagnosis not present

## 2022-07-24 DIAGNOSIS — Z87891 Personal history of nicotine dependence: Secondary | ICD-10-CM | POA: Diagnosis not present

## 2022-07-24 DIAGNOSIS — D509 Iron deficiency anemia, unspecified: Secondary | ICD-10-CM | POA: Diagnosis not present

## 2022-07-24 DIAGNOSIS — G4700 Insomnia, unspecified: Secondary | ICD-10-CM | POA: Diagnosis not present

## 2022-07-24 DIAGNOSIS — K5909 Other constipation: Secondary | ICD-10-CM | POA: Diagnosis not present

## 2022-07-24 DIAGNOSIS — I5020 Unspecified systolic (congestive) heart failure: Secondary | ICD-10-CM | POA: Diagnosis not present

## 2022-07-24 DIAGNOSIS — E785 Hyperlipidemia, unspecified: Secondary | ICD-10-CM | POA: Diagnosis not present

## 2022-07-24 DIAGNOSIS — G20 Parkinson's disease: Secondary | ICD-10-CM | POA: Diagnosis not present

## 2022-07-24 DIAGNOSIS — Z7901 Long term (current) use of anticoagulants: Secondary | ICD-10-CM | POA: Diagnosis not present

## 2022-07-24 DIAGNOSIS — I110 Hypertensive heart disease with heart failure: Secondary | ICD-10-CM | POA: Diagnosis not present

## 2022-07-25 ENCOUNTER — Telehealth: Payer: Self-pay | Admitting: Family Medicine

## 2022-07-25 DIAGNOSIS — J449 Chronic obstructive pulmonary disease, unspecified: Secondary | ICD-10-CM | POA: Diagnosis not present

## 2022-07-25 DIAGNOSIS — G20 Parkinson's disease: Secondary | ICD-10-CM | POA: Diagnosis not present

## 2022-07-25 DIAGNOSIS — D509 Iron deficiency anemia, unspecified: Secondary | ICD-10-CM | POA: Diagnosis not present

## 2022-07-25 DIAGNOSIS — I4891 Unspecified atrial fibrillation: Secondary | ICD-10-CM | POA: Diagnosis not present

## 2022-07-25 DIAGNOSIS — I110 Hypertensive heart disease with heart failure: Secondary | ICD-10-CM | POA: Diagnosis not present

## 2022-07-25 DIAGNOSIS — I5020 Unspecified systolic (congestive) heart failure: Secondary | ICD-10-CM | POA: Diagnosis not present

## 2022-07-25 NOTE — Telephone Encounter (Signed)
Caller states: - Patient is in need of an order for new platforms for her walker  - She does not want the family to have to buy these when insurance can cover it  - She has been faxing order requests for 3 weeks - This order is needed by Monday, 08/28.

## 2022-07-26 NOTE — Telephone Encounter (Signed)
Good Afternoon Morrie Sheldon,  I just spoke with Efraim Kaufmann as she was checking up on Wheelchair platform orders your company has requested for [a mutual patient]. The last note we have is a request for a fax number.   Would you send that number to me so I can get it back to the PCP Team, please?  You can also call us to speak to me or another member of the [employer] and they can added it to my note.  Cheers,   Tomasa Hosteller, MM [facility] Dial: [phone]  Fax: [phone] [email or web address]: [email or web address]

## 2022-07-26 NOTE — Telephone Encounter (Signed)
Called and lm on Melissa vm to have a fax number provided so I can fax the order.

## 2022-07-26 NOTE — Telephone Encounter (Signed)
[name] called and provided the fax number: [phone].

## 2022-07-29 DIAGNOSIS — I5020 Unspecified systolic (congestive) heart failure: Secondary | ICD-10-CM | POA: Diagnosis not present

## 2022-07-29 DIAGNOSIS — J449 Chronic obstructive pulmonary disease, unspecified: Secondary | ICD-10-CM | POA: Diagnosis not present

## 2022-07-29 DIAGNOSIS — I4891 Unspecified atrial fibrillation: Secondary | ICD-10-CM | POA: Diagnosis not present

## 2022-07-29 DIAGNOSIS — D509 Iron deficiency anemia, unspecified: Secondary | ICD-10-CM | POA: Diagnosis not present

## 2022-07-29 DIAGNOSIS — G20 Parkinson's disease: Secondary | ICD-10-CM | POA: Diagnosis not present

## 2022-07-29 DIAGNOSIS — I110 Hypertensive heart disease with heart failure: Secondary | ICD-10-CM | POA: Diagnosis not present

## 2022-07-29 NOTE — Telephone Encounter (Signed)
Rx has been faxed to fax # below.

## 2022-07-30 ENCOUNTER — Encounter: Payer: Self-pay | Admitting: Family Medicine

## 2022-07-30 ENCOUNTER — Ambulatory Visit (INDEPENDENT_AMBULATORY_CARE_PROVIDER_SITE_OTHER): Payer: Medicare Other | Admitting: Family Medicine

## 2022-07-30 VITALS — BP 134/84 | HR 91 | Temp 98.0°F | Ht 66.0 in | Wt 145.4 lb

## 2022-07-30 DIAGNOSIS — I10 Essential (primary) hypertension: Secondary | ICD-10-CM | POA: Diagnosis not present

## 2022-07-30 DIAGNOSIS — I5032 Chronic diastolic (congestive) heart failure: Secondary | ICD-10-CM | POA: Diagnosis not present

## 2022-07-30 DIAGNOSIS — I4891 Unspecified atrial fibrillation: Secondary | ICD-10-CM

## 2022-07-30 DIAGNOSIS — I701 Atherosclerosis of renal artery: Secondary | ICD-10-CM | POA: Diagnosis not present

## 2022-07-30 MED ORDER — TRAZODONE HCL 50 MG PO TABS: 75.0000 mg | ORAL_TABLET | Freq: Every evening | ORAL | 3 refills | Status: DC | PRN

## 2022-07-30 NOTE — Patient Instructions (Addendum)
Labs next visit  Consider RSV and high dose flu shot at pharmacy (if dont get flu shot at pharmacy we can give next visit) -consider new covid shot when released in October  Recommended follow up: Return in about 2 months (around 09/29/2022) for followup or sooner if needed.Schedule b4 you leave.

## 2022-07-30 NOTE — Progress Notes (Signed)
Phone [phone] In person visit   Subjective:   Cheryl Atkinson is a 88 y.o. year old very pleasant female patient who presents for/with See problem oriented charting Chief Complaint  Patient presents with   Follow-up    Care givers would like to discuss nursing home.   Hypertension   Past Medical History-  Patient Active Problem List   Diagnosis Date Noted   Pleural effusion due to CHF (congestive heart failure) (HCC) 05/26/2020    Priority: High   Heart failure with preserved ejection fraction (HCC) 03/01/2020    Priority: High   Bacteremia 01/28/2020    Priority: High   Atrial fibrillation (HCC) 01/25/2020    Priority: High   Memory loss 04/11/2016    Priority: High   Renal artery stenosis (HCC) 09/27/2015    Priority: High   Claudication (HCC) 05/04/2015    Priority: High   Atherosclerotic PVD with intermittent claudication (HCC) 04/26/2015    Priority: High   DNR (do not resuscitate) 09/29/2014    Priority: High   Hyperglycemia 07/19/2014    Priority: High   LOW BACK PAIN 09/25/2007    Priority: High   B12 deficiency 02/27/2021    Priority: Medium    Insomnia 03/01/2020    Priority: Medium    Major depression 03/25/2018    Priority: Medium    BPPV (benign paroxysmal positional vertigo) 07/12/2016    Priority: Medium    Hyperlipidemia 06/30/2015    Priority: Medium    Former smoker 09/29/2014    Priority: Medium    COPD (chronic obstructive pulmonary disease) (HCC) 09/20/2009    Priority: Medium    Iron deficiency anemia 11/09/2007    Priority: Medium    RESTLESS LEG SYNDROME 09/25/2007    Priority: Medium    Essential hypertension 09/25/2007    Priority: Medium    GERD 09/25/2007    Priority: Medium    Osteoporosis 09/25/2007    Priority: Medium    COVID-19 virus infection 08/29/2021    Priority: Low   S/P laparoscopic cholecystectomy 04/04/2020    Priority: Low   Abdominal aortic ectasia (HCC) 12/29/2019    Priority: Low   Cat bite of  right hand 12/21/2016    Priority: Low   Mallet toe of right foot 10/20/2015    Priority: Low   Tinnitus 12/31/2014    Priority: Low   Multinodular goiter 08/30/2013    Priority: Low   Spinal stenosis of lumbar region at multiple levels 09/29/2012    Priority: Low   HIP PAIN, BILATERAL 07/16/2010    Priority: Low   CONSTIPATION, CHRONIC 09/20/2009    Priority: Low   History of UTI 11/09/2007    Priority: Low   Renal lesion 08/29/2021    Medications- reviewed and updated Current Outpatient Medications  Medication Sig Dispense Refill   Albuterol Sulfate (PROAIR RESPICLICK) 108 (90 Base) MCG/ACT AEPB Inhale 2 puffs into the lungs every 6 (six) hours as needed (shortness of breath from COPD). 1 each 5   amLODipine (NORVASC) 5 MG tablet Take 1 tablet (5 mg total) by mouth daily. 30 tablet 5   apixaban (ELIQUIS) 2.5 MG TABS tablet Take 1 tablet (2.5 mg total) by mouth 2 (two) times daily. 180 tablet 3   CALCIUM PO Take by mouth.     carbidopa-levodopa (SINEMET IR) 25-100 MG tablet TAKE 1 TABLET BY MOUTH DAILY AS NEEDED FOR WORSENING RLS 90 tablet 1   Cholecalciferol (VITAMIN D3 PO) Take by mouth.  ferrous sulfate 325 (65 FE) MG tablet Take 325 mg by mouth 2 (two) times a week. Patient takes one tablet on Monday and Friday     furosemide (LASIX) 40 MG tablet TAKE 1 TABLET TWICE A DAY 180 tablet 3   irbesartan (AVAPRO) 300 MG tablet Take 1 tablet (300 mg total) by mouth daily. 14 tablet 0   LINZESS 145 MCG CAPS capsule Take 145 mcg by mouth daily as needed (constipation).     Multiple Vitamins-Minerals (PRESERVISION AREDS 2 PO) Take 1 tablet by mouth 2 (two) times daily.     polyethylene glycol (MIRALAX / GLYCOLAX) 17 g packet Take 17 g by mouth 2 (two) times daily. Reported on 03/14/2016 (Patient taking differently: Take 17 g by mouth daily as needed for mild constipation.)     potassium chloride SA (KLOR-CON) 10 MEQ tablet Take 1 tablet (10 mEq total) by mouth 2 (two) times daily as  needed. Needs to be taken  With each dose of lasix     rosuvastatin (CRESTOR) 40 MG tablet TAKE 1 TABLET DAILY 90 tablet 3   Rotigotine (NEUPRO) 3 MG/24HR PT24 PLACE 1 PATCH (3 MG) ONTO THE SKIN AT BEDTIME (Patient taking differently: Place 3 mg onto the skin at bedtime.) 90 patch 3   traMADol (ULTRAM) 50 MG tablet Take 1 tablet (50 mg total) by mouth every 6 (six) hours as needed. 30 tablet 0   traZODone (DESYREL) 50 MG tablet Take 1.5 tablets (75 mg total) by mouth at bedtime as needed for sleep. 135 tablet 3   vitamin B-12 (CYANOCOBALAMIN) 1000 MCG tablet Take 1,000 mcg by mouth daily.     No current facility-administered medications for this visit.     Objective:  BP 134/84   Pulse 91   Temp 98 F (36.7 C)   Ht 5\' 6"  (1.676 m)   Wt 145 lb 6.4 oz (66 kg)   LMP  (LMP Unknown)   SpO2 97%   BMI 23.47 kg/m  Gen: NAD, resting comfortably CV: RRR no murmurs rubs or gallops Lungs: CTAB no crackles, wheeze, rhonchi Ext: no edema Skin: warm, dry Neuro: wheelchair bound- looks to sister nad sister in law for several answres    Assessment and Plan   #social update- considering moving to [place] which she has been in before and liked- going to look into 2 other options because [facility] backed up 6 months - can walk with walker to get to restroom but needs help getting out of bed (essentially non ambulatory) - incontinent urine, continent stool - sister lays out medicine - meals prepared but she is able to eat - needs full assist with shower -intermittent disorientation -help with dressing -melissa with [facility] has said could help -needs platforms for walker- this was faxed in by yesterday  # Atrial fibrillation- new onset 2021 S: Rate controlled without medication still  Anticoagulated with eliquis 2.5 mg BID still (adjusted dose due to age, weight) Patient is  followed by cardiology: Dr. 2022  A/P:  anticoagulated and rate controlled apporpriately- continue current meds    #CHF with preserved EF  #Hypertension S: Compliant with irbesartan 300 mg (ok per cards as only unilateral renal artery stenosis) and lasix 40mg  twice daily. Cardiology added amlodipine 2.5 mg to try to keep pressure under 160- later bumped to full tablet -off hctz since starting lasix -off amlodiine march 2021 with edema issues and again 2022 - BP usually 130s and 140s -no increased edema or weight range-  stable A/P: CHF appears stable- continue current meds- tolerating amlodipine for BP thankfully HTN- controlled on repeat- amlodipine not ideal for CHF - but as noted above tolerating- continue current meds. BP better with lower stress with son and doing better lately.     #Restless leg syndrome S: Patient on Neupro 3 mg (history of augmentation). Ferritin levels have been low in the past -Also takes Sinemet as needed per Dr. Allena Katz (? parkinsons as well) - some concern about parkinson's by PT- has had locking/freezing with PT  A/P: Restless legs stable but concern for parksinsons- would like for her to discusw with Dr. Allena Katz- PT had mentioned trialing Abrija and they will discuss with her  #Headache each morning- 2 tylenol resolves it. Reports never had headaches for this long- months of issues. Just had 06/08/22 CT head - prior lacunar infarct but no mass. Blood pressure improving has helped    #Insomnia- only able to sleep an hour or so with the trazodone 75 mg- higher dose has not helped -watches tv up until bedtime and advised to stop again today - trial rozerem/ramelteon 8 mg- not effective and had rash so stopped  Recommended follow up: Return in about 2 months (around 09/29/2022) for followup or sooner if needed.Schedule b4 you leave. Future Appointments  Date Time Provider Department Center  08/08/2022  9:30 AM [facility]  08/20/2022  8:15 AM Sherrie George, MD [facility] None  10/18/2022  8:30 AM Nita Sickle K, DO [facility] None   Lab/Order  associations:   ICD-10-CM   1. Chronic heart failure with preserved ejection fraction (HCC)  I50.32     2. Atrial fibrillation, unspecified type (HCC)  I48.91     3. Essential hypertension  I10       Meds ordered this encounter  Medications   traZODone (DESYREL) 50 MG tablet    Sig: Take 1.5 tablets (75 mg total) by mouth at bedtime as needed for sleep.    Dispense:  135 tablet    Refill:  3    Return precautions advised.  Tana Conch, MD

## 2022-08-05 DIAGNOSIS — J449 Chronic obstructive pulmonary disease, unspecified: Secondary | ICD-10-CM | POA: Diagnosis not present

## 2022-08-05 DIAGNOSIS — G20 Parkinson's disease: Secondary | ICD-10-CM | POA: Diagnosis not present

## 2022-08-05 DIAGNOSIS — I4891 Unspecified atrial fibrillation: Secondary | ICD-10-CM | POA: Diagnosis not present

## 2022-08-05 DIAGNOSIS — D509 Iron deficiency anemia, unspecified: Secondary | ICD-10-CM | POA: Diagnosis not present

## 2022-08-05 DIAGNOSIS — I110 Hypertensive heart disease with heart failure: Secondary | ICD-10-CM | POA: Diagnosis not present

## 2022-08-05 DIAGNOSIS — I5020 Unspecified systolic (congestive) heart failure: Secondary | ICD-10-CM | POA: Diagnosis not present

## 2022-08-06 ENCOUNTER — Emergency Department (HOSPITAL_COMMUNITY): Payer: Medicare Other

## 2022-08-06 ENCOUNTER — Emergency Department (HOSPITAL_COMMUNITY)
Admission: EM | Admit: 2022-08-06 | Discharge: 2022-08-07 | Disposition: A | Discharge status: Home / Self Care | Payer: Medicare Other | Attending: Emergency Medicine | Admitting: Emergency Medicine

## 2022-08-06 DIAGNOSIS — W07XXXA Fall from chair, initial encounter: Secondary | ICD-10-CM | POA: Diagnosis not present

## 2022-08-06 DIAGNOSIS — S0003XA Contusion of scalp, initial encounter: Secondary | ICD-10-CM | POA: Diagnosis not present

## 2022-08-06 DIAGNOSIS — M545 Low back pain, unspecified: Secondary | ICD-10-CM | POA: Diagnosis not present

## 2022-08-06 DIAGNOSIS — S0990XA Unspecified injury of head, initial encounter: Secondary | ICD-10-CM | POA: Diagnosis not present

## 2022-08-06 DIAGNOSIS — M542 Cervicalgia: Secondary | ICD-10-CM | POA: Diagnosis not present

## 2022-08-06 DIAGNOSIS — J90 Pleural effusion, not elsewhere classified: Secondary | ICD-10-CM | POA: Diagnosis not present

## 2022-08-06 DIAGNOSIS — Z7901 Long term (current) use of anticoagulants: Secondary | ICD-10-CM | POA: Insufficient documentation

## 2022-08-06 DIAGNOSIS — J323 Chronic sphenoidal sinusitis: Secondary | ICD-10-CM | POA: Diagnosis not present

## 2022-08-06 DIAGNOSIS — M546 Pain in thoracic spine: Secondary | ICD-10-CM | POA: Diagnosis not present

## 2022-08-06 DIAGNOSIS — M549 Dorsalgia, unspecified: Secondary | ICD-10-CM | POA: Diagnosis not present

## 2022-08-06 DIAGNOSIS — M2669 Other specified disorders of temporomandibular joint: Secondary | ICD-10-CM | POA: Diagnosis not present

## 2022-08-06 DIAGNOSIS — W19XXXA Unspecified fall, initial encounter: Secondary | ICD-10-CM

## 2022-08-06 DIAGNOSIS — I6523 Occlusion and stenosis of bilateral carotid arteries: Secondary | ICD-10-CM | POA: Diagnosis not present

## 2022-08-06 DIAGNOSIS — S299XXA Unspecified injury of thorax, initial encounter: Secondary | ICD-10-CM | POA: Diagnosis not present

## 2022-08-06 DIAGNOSIS — S199XXA Unspecified injury of neck, initial encounter: Secondary | ICD-10-CM | POA: Diagnosis not present

## 2022-08-06 DIAGNOSIS — J439 Emphysema, unspecified: Secondary | ICD-10-CM | POA: Diagnosis not present

## 2022-08-06 DIAGNOSIS — I2510 Atherosclerotic heart disease of native coronary artery without angina pectoris: Secondary | ICD-10-CM | POA: Diagnosis not present

## 2022-08-06 LAB — CBC WITH DIFFERENTIAL/PLATELET
Abs Immature Granulocytes: 0.02 10*3/uL (ref 0.00–0.07)
Basophils Absolute: 0.1 10*3/uL (ref 0.0–0.1)
Basophils Relative: 1 %
Eosinophils Absolute: 0.2 10*3/uL (ref 0.0–0.5)
Eosinophils Relative: 3 %
HCT: 36.5 % (ref 36.0–46.0)
Hemoglobin: 11.9 g/dL — ABNORMAL LOW (ref 12.0–15.0)
Immature Granulocytes: 0 %
Lymphocytes Relative: 29 %
Lymphs Abs: 2 10*3/uL (ref 0.7–4.0)
MCH: 30.3 pg (ref 26.0–34.0)
MCHC: 32.6 g/dL (ref 30.0–36.0)
MCV: 92.9 fL (ref 80.0–100.0)
Monocytes Absolute: 0.7 10*3/uL (ref 0.1–1.0)
Monocytes Relative: 10 %
Neutro Abs: 4.1 10*3/uL (ref 1.7–7.7)
Neutrophils Relative %: 57 %
Platelets: 200 10*3/uL (ref 150–400)
RBC: 3.93 MIL/uL (ref 3.87–5.11)
RDW: 13.5 % (ref 11.5–15.5)
WBC: 7 10*3/uL (ref 4.0–10.5)
nRBC: 0 % (ref 0.0–0.2)

## 2022-08-06 LAB — PROTIME-INR
INR: 1.3 — ABNORMAL HIGH (ref 0.8–1.2)
Prothrombin Time: 15.7 seconds — ABNORMAL HIGH (ref 11.4–15.2)

## 2022-08-06 LAB — COMPREHENSIVE METABOLIC PANEL
ALT: 13 U/L (ref 0–44)
AST: 19 U/L (ref 15–41)
Albumin: 3.3 g/dL — ABNORMAL LOW (ref 3.5–5.0)
Alkaline Phosphatase: 78 U/L (ref 38–126)
Anion gap: 10 (ref 5–15)
BUN: 23 mg/dL (ref 8–23)
CO2: 26 mmol/L (ref 22–32)
Calcium: 8.8 mg/dL — ABNORMAL LOW (ref 8.9–10.3)
Chloride: 105 mmol/L (ref 98–111)
Creatinine, Ser: 1.07 mg/dL — ABNORMAL HIGH (ref 0.44–1.00)
GFR, Estimated: 50 mL/min — ABNORMAL LOW (ref >60)
Glucose, Bld: 130 mg/dL — ABNORMAL HIGH (ref 70–99)
Potassium: 3.3 mmol/L — ABNORMAL LOW (ref 3.5–5.1)
Sodium: 141 mmol/L (ref 135–145)
Total Bilirubin: 0.5 mg/dL (ref 0.3–1.2)
Total Protein: 6.5 g/dL (ref 6.5–8.1)

## 2022-08-06 NOTE — ED Provider Notes (Signed)
[facility] EMERGENCY DEPARTMENT Provider Note   CSN: [id number] Arrival date & time: 08/06/22  2315     History {Add pertinent medical, surgical, social history, OB history to HPI:1} No chief complaint on file.   Cheryl Atkinson is a 88 y.o. female.  88 year old female who presents as level 2 trauma secondary to a fall on thinners.  Patient states that she was trying to use her bedside commode when she tripped over the chair.  She hit her head and is having some posterior neck and some thoracic back pain.  Did not syncopized.  Does not feel weak.  She is in a wheelchair at baseline has not tried to walk after this although sometimes can walk with assistance.  She does not hurt anywhere else besides the c-collar that was applied by EMS.  Vital signs stable in route besides being mildly hypertensive.  She states that her son tried to catch her when she fell however still fell.       Home Medications Prior to Admission medications   Medication Sig Start Date End Date Taking? Authorizing Provider  Albuterol Sulfate (PROAIR RESPICLICK) 108 (90 Base) MCG/ACT AEPB Inhale 2 puffs into the lungs every 6 (six) hours as needed (shortness of breath from COPD). 12/04/20   Shelva Majestic, MD  amLODipine (NORVASC) 5 MG tablet Take 1 tablet (5 mg total) by mouth daily. 06/27/22 09/25/22  Cannon Kettle, PA-C  apixaban (ELIQUIS) 2.5 MG TABS tablet Take 1 tablet (2.5 mg total) by mouth 2 (two) times daily. 01/23/22   Shelva Majestic, MD  CALCIUM PO Take by mouth.    [provider]  carbidopa-levodopa (SINEMET IR) 25-100 MG tablet TAKE 1 TABLET BY MOUTH DAILY AS NEEDED FOR WORSENING RLS 07/24/22   Nita Sickle K, DO  Cholecalciferol (VITAMIN D3 PO) Take by mouth.    [provider]  ferrous sulfate 325 (65 FE) MG tablet Take 325 mg by mouth 2 (two) times a week. Patient takes one tablet on Monday and Friday    [provider]  furosemide (LASIX) 40 MG  tablet TAKE 1 TABLET TWICE A DAY 11/13/21   Shelva Majestic, MD  irbesartan (AVAPRO) 300 MG tablet Take 1 tablet (300 mg total) by mouth daily. 06/20/22   Shelva Majestic, MD  LINZESS 145 MCG CAPS capsule Take 145 mcg by mouth daily as needed (constipation). 02/22/21   [provider]  Multiple Vitamins-Minerals (PRESERVISION AREDS 2 PO) Take 1 tablet by mouth 2 (two) times daily.    [provider]  polyethylene glycol (MIRALAX / GLYCOLAX) 17 g packet Take 17 g by mouth 2 (two) times daily. Reported on 03/14/2016 Patient taking differently: Take 17 g by mouth daily as needed for mild constipation. 01/03/20   Hongalgi, Maximino Greenland, MD  potassium chloride SA (KLOR-CON) 10 MEQ tablet Take 1 tablet (10 mEq total) by mouth 2 (two) times daily as needed. Needs to be taken  With each dose of lasix 09/07/21   [name], MD  rosuvastatin (CRESTOR) 40 MG tablet TAKE 1 TABLET DAILY 11/13/21   Shelva Majestic, MD  Rotigotine (NEUPRO) 3 MG/24HR PT24 PLACE 1 PATCH (3 MG) ONTO THE SKIN AT BEDTIME Patient taking differently: Place 3 mg onto the skin at bedtime. 12/04/20   Shelva Majestic, MD  traMADol (ULTRAM) 50 MG tablet Take 1 tablet (50 mg total) by mouth every 6 (six) hours as needed. 10/29/21   Willow Ora,  MD  traZODone (DESYREL) 50 MG tablet Take 1.5 tablets (75 mg total) by mouth at bedtime as needed for sleep. 07/30/22   Shelva Majestic, MD  vitamin B-12 (CYANOCOBALAMIN) 1000 MCG tablet Take 1,000 mcg by mouth daily.    [provider]      Allergies    Codeine    Review of Systems   Review of Systems  Physical Exam Updated Vital Signs BP (!) 162/74   Pulse 60   Temp 97.8 F (36.6 C) (Oral)   Resp 18   Ht 5\' 6"  (1.676 m)   Wt 66 kg   LMP  (LMP Unknown)   SpO2 96%   BMI 23.48 kg/m  Physical Exam Vitals and nursing note reviewed.  Constitutional:      Appearance: She is well-developed.  HENT:     Head: Normocephalic.     Comments: Small posterior  scalp hematoma, no open wounds Eyes:     Pupils: Pupils are equal, round, and reactive to light.  Cardiovascular:     Rate and Rhythm: Normal rate and regular rhythm.  Pulmonary:     Effort: No respiratory distress.     Breath sounds: No stridor.  Abdominal:     General: There is no distension.  Musculoskeletal:        General: Tenderness (thoracic back tenderness) present. No swelling. Normal range of motion.     Cervical back: Normal range of motion.  Skin:    General: Skin is warm and dry.  Neurological:     General: No focal deficit present.     Mental Status: She is alert.     ED Results / Procedures / Treatments   Labs (all labs ordered are listed, but only abnormal results are displayed) Labs Reviewed - No data to display  EKG None  Radiology No results found.  Procedures Procedures  {Document cardiac monitor, telemetry assessment procedure when appropriate:1}  Medications Ordered in ED Medications - No data to display  ED Course/ Medical Decision Making/ A&P                           Medical Decision Making Amount and/or Complexity of Data Reviewed Labs: ordered. Radiology: ordered.  CT head/neck/Tspine. No other obvious injuries. Sounds to be a mechanical fall but will verify with family members when they arrive. Overall appears well.  ***  {Document critical care time when appropriate:1} {Document review of labs and clinical decision tools ie heart score, Chads2Vasc2 etc:1}  {Document your independent review of radiology images, and any outside records:1} {Document your discussion with family members, caretakers, and with consultants:1} {Document social determinants of health affecting pt's care:1} {Document your decision making why or why not admission, treatments were needed:1} Final Clinical Impression(s) / ED Diagnoses Final diagnoses:  None    Rx / DC Orders ED Discharge Orders     None

## 2022-08-06 NOTE — ED Triage Notes (Signed)
BIB [facility] from home. Trying to use toilet. Fell and hit back of head. Son witnessed attempted to catch. Complains of neck and lower back pain.  150CBG 97% RA  142/78 manual

## 2022-08-07 MED ORDER — ACETAMINOPHEN 500 MG PO TABS
1000.0000 mg | ORAL_TABLET | Freq: Once | ORAL | Status: AC
Start: 2022-08-07 — End: 2022-08-07
  Administered 2022-08-07: 1000 mg via ORAL
  Filled 2022-08-07: qty 2

## 2022-08-07 NOTE — ED Notes (Signed)
DC papers reviewed. No questions or concerns. No signs of distress. Pt assisted to wheelchair and out to lobby. Appropriate measures for safety taken. 

## 2022-08-07 NOTE — ED Notes (Signed)
Trauma Response Nurse Documentation   Cheryl Atkinson is a 88 y.o. female arriving to [facility] ED via [facility]  On Eliquis (apixaban) daily. Trauma was activated as a Level 2 by Alric Seton based on the following trauma criteria Elderly patients > 65 with head trauma on anti-coagulation (excluding ASA). Trauma team at the bedside on patient arrival.   Patient cleared for CT by Dr. Clayborne Dana. Pt transported to CT with trauma response nurse present to monitor. RN remained with the patient throughout their absence from the department for clinical observation.   GCS 15.  History   Past Medical History:  Diagnosis Date   Acute cholecystitis 12/29/2019   Anemia    Arthritis    "shoulders" (05/04/2015)   Cellulitis of right lower extremity 11/27/2013   CHF (congestive heart failure) (HCC)    Chronic lower back pain    Constipation    COPD (chronic obstructive pulmonary disease) (HCC)    GERD (gastroesophageal reflux disease)    History of hiatal hernia    HLD (hyperlipidemia)    Hypertension    Insomnia    Osteoporosis    Peripheral arterial disease (HCC)    Restless leg syndrome    Thyroid disease      Past Surgical History:  Procedure Laterality Date   ABDOMINAL AORTAGRAM  05/04/2015   Procedure: Abdominal Aortagram;  Surgeon: Runell Gess, MD;  Location: [facility] INVASIVE CV LAB;  Service: Cardiovascular;;   APPENDECTOMY     CATARACT EXTRACTION W/ INTRAOCULAR LENS  IMPLANT, BILATERAL Bilateral    CHOLECYSTECTOMY N/A 04/04/2020   Procedure: LAPAROSCOPIC CHOLECYSTECTOMY;  Surgeon: Violeta Gelinas, MD;  Location: [facility] OR;  Service: General;  Laterality: N/A;   I & D EXTREMITY Right 12/22/2016   Procedure: IRRIGATION AND DEBRIDEMENT EXTREMITY;  Surgeon: Mack Hook, MD;  Location: [facility] OR;  Service: Orthopedics;  Laterality: Right;   IR EXCHANGE BILIARY DRAIN  03/13/2020   IR PATIENT EVAL TECH 0-60 MINS  12/30/2019   IR PERC CHOLECYSTOSTOMY  01/27/2020   IR RADIOLOGIST EVAL &  MGMT  05/16/2020   PERIPHERAL VASCULAR CATHETERIZATION N/A 05/04/2015   Procedure: Lower Extremity Angiography;  Surgeon: Runell Gess, MD;  Location: [facility] INVASIVE CV LAB;  Service: Cardiovascular;  Laterality: N/A;   PERIPHERAL VASCULAR CATHETERIZATION  05/04/2015   Procedure: Peripheral Vascular Intervention;  Surgeon: Runell Gess, MD;  Location: [facility] INVASIVE CV LAB;  Service: Cardiovascular;;  RCIA - 7x22 ICAST   PERIPHERAL VASCULAR CATHETERIZATION Right 09/04/2015   Procedure: Peripheral Vascular Atherectomy;  Surgeon: Runell Gess, MD;  Location: [facility] INVASIVE CV LAB;  Service: Cardiovascular;  Laterality: Right;  SFA   sfa Right 09/04/2015   de balloon   THYROID SURGERY Right ?2013   "had goiter taken off my neck"   TONSILLECTOMY     VAGINAL HYSTERECTOMY         Initial Focused Assessment (If applicable, or please see trauma documentation): Airway-- intact Breathing-- spontaneous and unlabored Circulation-- no apparent bleeding noted  CT's Completed:   CT Head and CT C-Spine , CT Thoracic Spine  Interventions:  Trauma labs C collar CT head CT c-spine CT t-spine Plan for disposition:  Unknown at this time.  Consults completed:  none at 1231.  Event Summary: Patient brought in by [facility] from home. Patient experienced a mechanical fall this evening while attempting to transfer to her bedside commode. Patient reports son tried to catch her, but was unable to and ultimately patient fell  to floor. Upon arrival patient complaining of thoracic spine pain. Patient alert and oriented x4, GCS 15. EMS c collar replaced with miami j by ED staff. Patient taken to CT by TRN and remained with patient throughout entirety of imaging due to risk for head/neck injury.   Bedside handoff with ED RN Fredricka Bonine.    Leota Sauers  Trauma Response RN  Please call TRN at [phone] for further assistance.

## 2022-08-08 ENCOUNTER — Ambulatory Visit: Payer: Medicare Other

## 2022-08-08 ENCOUNTER — Telehealth: Payer: Self-pay

## 2022-08-08 NOTE — Telephone Encounter (Signed)
No answer when called [phone] (H) and [phone] (M) for AWV. No voicemail. Unable to leave msg. Reschedule.

## 2022-08-11 ENCOUNTER — Encounter (HOSPITAL_COMMUNITY): Payer: Self-pay

## 2022-08-11 ENCOUNTER — Other Ambulatory Visit: Payer: Self-pay

## 2022-08-11 ENCOUNTER — Emergency Department (HOSPITAL_COMMUNITY)
Admission: EM | Admit: 2022-08-11 | Discharge: 2022-08-11 | Disposition: A | Discharge status: Home / Self Care | Payer: Medicare Other | Attending: Emergency Medicine | Admitting: Emergency Medicine

## 2022-08-11 ENCOUNTER — Emergency Department (HOSPITAL_COMMUNITY): Payer: Medicare Other

## 2022-08-11 DIAGNOSIS — Z7901 Long term (current) use of anticoagulants: Secondary | ICD-10-CM | POA: Insufficient documentation

## 2022-08-11 DIAGNOSIS — R42 Dizziness and giddiness: Secondary | ICD-10-CM | POA: Diagnosis not present

## 2022-08-11 DIAGNOSIS — J449 Chronic obstructive pulmonary disease, unspecified: Secondary | ICD-10-CM | POA: Insufficient documentation

## 2022-08-11 DIAGNOSIS — I10 Essential (primary) hypertension: Secondary | ICD-10-CM | POA: Diagnosis not present

## 2022-08-11 DIAGNOSIS — I951 Orthostatic hypotension: Secondary | ICD-10-CM

## 2022-08-11 DIAGNOSIS — J189 Pneumonia, unspecified organism: Secondary | ICD-10-CM | POA: Diagnosis not present

## 2022-08-11 DIAGNOSIS — R55 Syncope and collapse: Secondary | ICD-10-CM

## 2022-08-11 DIAGNOSIS — Z79899 Other long term (current) drug therapy: Secondary | ICD-10-CM | POA: Insufficient documentation

## 2022-08-11 DIAGNOSIS — M25572 Pain in left ankle and joints of left foot: Secondary | ICD-10-CM | POA: Diagnosis not present

## 2022-08-11 DIAGNOSIS — R531 Weakness: Secondary | ICD-10-CM | POA: Diagnosis not present

## 2022-08-11 DIAGNOSIS — M25551 Pain in right hip: Secondary | ICD-10-CM | POA: Diagnosis not present

## 2022-08-11 LAB — BASIC METABOLIC PANEL
Anion gap: 13 (ref 5–15)
BUN: 27 mg/dL — ABNORMAL HIGH (ref 8–23)
CO2: 29 mmol/L (ref 22–32)
Calcium: 8.9 mg/dL (ref 8.9–10.3)
Chloride: 103 mmol/L (ref 98–111)
Creatinine, Ser: 1.31 mg/dL — ABNORMAL HIGH (ref 0.44–1.00)
GFR, Estimated: 39 mL/min — ABNORMAL LOW (ref >60)
Glucose, Bld: 136 mg/dL — ABNORMAL HIGH (ref 70–99)
Potassium: 3.9 mmol/L (ref 3.5–5.1)
Sodium: 145 mmol/L (ref 135–145)

## 2022-08-11 LAB — URINALYSIS, ROUTINE W REFLEX MICROSCOPIC
Bacteria, UA: NONE SEEN
Bilirubin Urine: NEGATIVE
Glucose, UA: NEGATIVE mg/dL
Hgb urine dipstick: NEGATIVE
Ketones, ur: NEGATIVE mg/dL
Leukocytes,Ua: NEGATIVE
Nitrite: NEGATIVE
Protein, ur: 100 mg/dL — AB
Specific Gravity, Urine: 1.017 (ref 1.005–1.030)
pH: 7 (ref 5.0–8.0)

## 2022-08-11 LAB — CBC
HCT: 37.7 % (ref 36.0–46.0)
Hemoglobin: 12.3 g/dL (ref 12.0–15.0)
MCH: 30.3 pg (ref 26.0–34.0)
MCHC: 32.6 g/dL (ref 30.0–36.0)
MCV: 92.9 fL (ref 80.0–100.0)
Platelets: 216 10*3/uL (ref 150–400)
RBC: 4.06 MIL/uL (ref 3.87–5.11)
RDW: 13.5 % (ref 11.5–15.5)
WBC: 6.7 10*3/uL (ref 4.0–10.5)
nRBC: 0 % (ref 0.0–0.2)

## 2022-08-11 MED ORDER — DOXYCYCLINE HYCLATE 100 MG PO CAPS: 100.0000 mg | ORAL_CAPSULE | Freq: Two times a day (BID) | ORAL | 0 refills | Status: AC

## 2022-08-11 MED ORDER — SODIUM CHLORIDE 0.9 % IV BOLUS
500.0000 mL | Freq: Once | INTRAVENOUS | Status: AC
  Administered 2022-08-11: 500 mL via INTRAVENOUS

## 2022-08-11 MED ORDER — ACETAMINOPHEN 325 MG PO TABS
650.0000 mg | ORAL_TABLET | Freq: Once | ORAL | Status: AC
  Administered 2022-08-11: 650 mg via ORAL
  Filled 2022-08-11: qty 2

## 2022-08-11 NOTE — ED Provider Notes (Signed)
[facility] EMERGENCY DEPARTMENT Provider Note   CSN: [id number] Arrival date & time: 08/11/22  1205     History  Chief Complaint  Patient presents with   Near Syncope    Cheryl Atkinson is a 88 y.o. female.  Patient is an 88 year old female with a past medical history of A-fib on Eliquis, hypertension, COPD presenting to the emergency department with dizziness.  The patient states that she has started to feel lightheaded and dizzy every time she tries to get up this morning.  She states that when she sits up in bed or stands up her symptoms come on.  She states that when she is laying still in bed she is asymptomatic.  She denies any associated chest pain or shortness of breath, fevers or chills, nausea or vomiting or diarrhea.  She denies any dysuria or hematuria but states that she has had dizziness in the past with UTIs and feels like she might have a UTI.  She denies any falls from her dizziness.  The history is provided by the patient.  Near Syncope       Home Medications Prior to Admission medications   Medication Sig Start Date End Date Taking? Authorizing Provider  Albuterol Sulfate (PROAIR RESPICLICK) 108 (90 Base) MCG/ACT AEPB Inhale 2 puffs into the lungs every 6 (six) hours as needed (shortness of breath from COPD). 12/04/20   Shelva Majestic, MD  amLODipine (NORVASC) 5 MG tablet Take 1 tablet (5 mg total) by mouth daily. 06/27/22 09/25/22  Cannon Kettle, PA-C  apixaban (ELIQUIS) 2.5 MG TABS tablet Take 1 tablet (2.5 mg total) by mouth 2 (two) times daily. 01/23/22   Shelva Majestic, MD  CALCIUM PO Take by mouth.    [provider]  carbidopa-levodopa (SINEMET IR) 25-100 MG tablet TAKE 1 TABLET BY MOUTH DAILY AS NEEDED FOR WORSENING RLS 07/24/22   Nita Sickle K, DO  Cholecalciferol (VITAMIN D3 PO) Take by mouth.    [provider]  ferrous sulfate 325 (65 FE) MG tablet Take 325 mg by mouth 2 (two) times a week. Patient takes  one tablet on Monday and Friday    [provider]  furosemide (LASIX) 40 MG tablet TAKE 1 TABLET TWICE A DAY 11/13/21   Shelva Majestic, MD  irbesartan (AVAPRO) 300 MG tablet Take 1 tablet (300 mg total) by mouth daily. 06/20/22   Shelva Majestic, MD  LINZESS 145 MCG CAPS capsule Take 145 mcg by mouth daily as needed (constipation). 02/22/21   [provider]  Multiple Vitamins-Minerals (PRESERVISION AREDS 2 PO) Take 1 tablet by mouth 2 (two) times daily.    [provider]  polyethylene glycol (MIRALAX / GLYCOLAX) 17 g packet Take 17 g by mouth 2 (two) times daily. Reported on 03/14/2016 Patient taking differently: Take 17 g by mouth daily as needed for mild constipation. 01/03/20   Hongalgi, Maximino Greenland, MD  potassium chloride SA (KLOR-CON) 10 MEQ tablet Take 1 tablet (10 mEq total) by mouth 2 (two) times daily as needed. Needs to be taken  With each dose of lasix 09/07/21   [name], MD  rosuvastatin (CRESTOR) 40 MG tablet TAKE 1 TABLET DAILY 11/13/21   Shelva Majestic, MD  Rotigotine (NEUPRO) 3 MG/24HR PT24 PLACE 1 PATCH (3 MG) ONTO THE SKIN AT BEDTIME Patient taking differently: Place 3 mg onto the skin at bedtime. 12/04/20   Shelva Majestic, MD  traMADol Janean Sark) 50 MG tablet Take  1 tablet (50 mg total) by mouth every 6 (six) hours as needed. 10/29/21   Willow Ora, MD  traZODone (DESYREL) 50 MG tablet Take 1.5 tablets (75 mg total) by mouth at bedtime as needed for sleep. 07/30/22   Shelva Majestic, MD  vitamin B-12 (CYANOCOBALAMIN) 1000 MCG tablet Take 1,000 mcg by mouth daily.    [provider]      Allergies    Codeine    Review of Systems   Review of Systems  Cardiovascular:  Positive for near-syncope.    Physical Exam Updated Vital Signs BP (!) 146/68 (BP Location: Right Arm)   Pulse 71   Temp 97.6 F (36.4 C) (Oral)   Resp 20   Ht 5\' 6"  (1.676 m)   LMP  (LMP Unknown)   SpO2 100%   BMI 23.48 kg/m  Physical Exam Vitals  and nursing note reviewed.  Constitutional:      General: She is not in acute distress.    Appearance: Normal appearance.  HENT:     Head: Normocephalic and atraumatic.     Nose: Nose normal.     Mouth/Throat:     Mouth: Mucous membranes are moist.     Pharynx: Oropharynx is clear.  Eyes:     Extraocular Movements: Extraocular movements intact.     Conjunctiva/sclera: Conjunctivae normal.     Pupils: Pupils are equal, round, and reactive to light.  Cardiovascular:     Rate and Rhythm: Normal rate. Rhythm irregular.     Pulses: Normal pulses.     Heart sounds: Normal heart sounds.  Pulmonary:     Effort: Pulmonary effort is normal.     Breath sounds: Normal breath sounds.  Abdominal:     General: Abdomen is flat.     Palpations: Abdomen is soft.     Tenderness: There is no abdominal tenderness.  Musculoskeletal:        General: Normal range of motion.     Cervical back: Normal range of motion and neck supple.     Right lower leg: No edema.     Left lower leg: No edema.  Skin:    General: Skin is warm and dry.  Neurological:     General: No focal deficit present.     Mental Status: She is alert and oriented to person, place, and time.     Sensory: No sensory deficit.     Motor: No weakness.  Psychiatric:        Mood and Affect: Mood normal.        Behavior: Behavior normal.     ED Results / Procedures / Treatments   Labs (all labs ordered are listed, but only abnormal results are displayed) Labs Reviewed  BASIC METABOLIC PANEL - Abnormal; Notable for the following components:      Result Value   Glucose, Bld 136 (*)    BUN 27 (*)    Creatinine, Ser 1.31 (*)    GFR, Estimated 39 (*)    All other components within normal limits  CBC  URINALYSIS, ROUTINE W REFLEX MICROSCOPIC    EKG None  Radiology DG Chest Portable 1 View  Result Date: 08/11/2022 CLINICAL DATA:  Dizziness EXAM: PORTABLE CHEST 1 VIEW COMPARISON:  06/08/2022 FINDINGS: Transverse diameter of  heart is increased. Thoracic aorta is tortuous. There are no signs of pulmonary edema. Increased interstitial markings are seen in right parahilar regions. Left hemidiaphragm is elevated. There is no pneumothorax. There is possible minimal blunting  of left lateral CP angle. Possible old bone infarct is seen in the proximal right humerus. IMPRESSION: Cardiomegaly. Interval appearance of patchy interstitial infiltrate in right parahilar region suggests pneumonia. Blunting of left lateral CP angle may be due to minimal effusion or pleural thickening. Electronically Signed   By: Ernie Avena M.D.   On: 08/11/2022 12:49    Procedures Procedures    Medications Ordered in ED Medications  sodium chloride 0.9 % bolus 500 mL (has no administration in time range)    ED Course/ Medical Decision Making/ A&P Clinical Course as of 08/11/22 1521  Sun Aug 11, 2022  1326 Patient's labs show mild elevation in creatinine of 1.3 from baseline around 1.0.  She will be given a small fluid bolus.  Orthostatic vitals will be performed as a cause of her presyncope.  Urine is pending at this time. [VK]  1520 Orthostatics were positive concerning for orthostatic hypotension as a cause of her lightheadedness. UA is pending at this time. Patient signed out to Dr. Wallace Cullens pending UA [VK]    Clinical Course User Index [VK] Phoebe Sharps, DO                           Medical Decision Making This patient presents to the ED with chief complaint(s) of dizziness with pertinent past medical history of A-fib, hypertension, COPD which further complicates the presenting complaint. The complaint involves an extensive differential diagnosis and also carries with it a high risk of complications and morbidity.    The differential diagnosis includes arrhythmia, electrolyte abnormality, hypo or hyperglycemia, dehydration, infection, orthostasis, patient has no focal neurologic deficits making CVA unlikely  Additional history  obtained: Additional history obtained from N/A Records reviewed Primary Care Documents  ED Course and Reassessment: Patient will have evaluation including EKG, labs with orthostasis and urine.  Patient also complained to triage about hip pain since her fall.  The patient clarifies that she has chronic hip pain and that this is no worse than her usual pain.  She states that she only ambulates with a walker to the bathroom and has had no change in her ambulation status in the setting of her pain.  It is unlikely that she has an acute fracture or dislocation, she has no bony tenderness on exam.  Independent labs interpretation:  The following labs were independently interpreted: labs with mild elevation in Cr, otherwise at baseline. UA pending  Independent visualization of imaging: - I independently visualized the following imaging with scope of interpretation limited to determining acute life threatening conditions related to emergency care: CXR, which revealed possible pneumonia, patient has no fever or cough or other respiratory symptoms making pneumonia less likely  Consultation: - Consulted or discussed management/test interpretation w/ external professional: N/A    Amount and/or Complexity of Data Reviewed Labs: ordered. Radiology: ordered.          Final Clinical Impression(s) / ED Diagnoses Final diagnoses:  None    Rx / DC Orders ED Discharge Orders     None         Phoebe Sharps, DO 08/11/22 1522

## 2022-08-11 NOTE — ED Triage Notes (Signed)
Pt bib [facility] from home where she lives with her son. Pt states that she was in bed this morning and just started feeling dizzy. Pt states that she has felt generally weak and right sided hip pain since her last fall on 9/5. Pt arrives [facility] vitals: 130/66,60-80 HR afib, 97% RA

## 2022-08-11 NOTE — ED Notes (Signed)
Pt arrives with rash to left inner elbow

## 2022-08-11 NOTE — Discharge Instructions (Signed)
It was a pleasure caring for you today in the emergency department.  Please return to the emergency department for any worsening or worrisome symptoms.   Please follow up with cardiology and your pcp

## 2022-08-11 NOTE — ED Provider Notes (Signed)
  Provider Note MRN:  [id number]  Arrival date & time: 08/11/22    ED Course and Medical Decision Making  Assumed care from [facility] at shift change.  See note from prior team for complete details, in brief:  88 year old female history of A-fib on Eliquis, HTN, COPD CC dizziness, lightheaded Symptoms provoked by sitting upright or attempting to ambulate.  Symptoms improved with rest No chest pain, headache, numbness or tingling.  No chest pain or dyspnea. Labs reviewed show mild elevation to creatinine from baseline. Orthostatic vital signs consistent with orthostatic hypotension IV fluids.  Plan per prior physician reassess, UA  UA was negative Tol p.o. intake with difficulty.  Feels back to baseline. X-rays concerning for possible pneumonia.  She has no URI symptoms, no dyspnea, no cough, no arthralgias.  She is not septic.  She is breathing complaint ambient air.  We will give oral antibiotics.  Discussed strict return precautions, close  patient follow-up w/ PCP. Advised she f/u with her cardiologist  The patient improved significantly and was discharged in stable condition. Detailed discussions were had with the patient regarding current findings, and need for close f/u with PCP or on call doctor. The patient has been instructed to return immediately if the symptoms worsen in any way for re-evaluation. Patient verbalized understanding and is in agreement with current care plan. All questions answered prior to discharge.    Procedures  Final Clinical Impressions(s) / ED Diagnoses     ICD-10-CM   1. Orthostatic hypotension  I95.1     2. Community acquired pneumonia, unspecified laterality  J18.9     3. Near syncope  R55 Ambulatory referral to Cardiology      ED Discharge Orders          Ordered    doxycycline (VIBRAMYCIN) 100 MG capsule  2 times daily        08/11/22 2102    Ambulatory referral to Cardiology        08/11/22 2103              Discharge  Instructions      It was a pleasure caring for you today in the emergency department.  Please return to the emergency department for any worsening or worrisome symptoms.   Please follow up with cardiology and your pcp        Sloan Leiter, DO 08/11/22 2358

## 2022-08-13 ENCOUNTER — Telehealth: Payer: Self-pay | Admitting: Family Medicine

## 2022-08-13 DIAGNOSIS — D509 Iron deficiency anemia, unspecified: Secondary | ICD-10-CM | POA: Diagnosis not present

## 2022-08-13 DIAGNOSIS — J449 Chronic obstructive pulmonary disease, unspecified: Secondary | ICD-10-CM | POA: Diagnosis not present

## 2022-08-13 DIAGNOSIS — I4891 Unspecified atrial fibrillation: Secondary | ICD-10-CM | POA: Diagnosis not present

## 2022-08-13 DIAGNOSIS — I5020 Unspecified systolic (congestive) heart failure: Secondary | ICD-10-CM | POA: Diagnosis not present

## 2022-08-13 DIAGNOSIS — G20 Parkinson's disease: Secondary | ICD-10-CM | POA: Diagnosis not present

## 2022-08-13 DIAGNOSIS — I110 Hypertensive heart disease with heart failure: Secondary | ICD-10-CM | POA: Diagnosis not present

## 2022-08-13 NOTE — Telephone Encounter (Signed)
See below
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
## 2022-08-14 NOTE — Telephone Encounter (Signed)
Please see below and see if pt wants to be seen sooner.

## 2022-08-16 ENCOUNTER — Encounter: Payer: Self-pay | Admitting: Nurse Practitioner

## 2022-08-16 ENCOUNTER — Ambulatory Visit: Payer: Medicare Other | Attending: Nurse Practitioner | Admitting: Nurse Practitioner

## 2022-08-16 VITALS — BP 147/79 | HR 79 | Ht 66.0 in | Wt 144.2 lb

## 2022-08-16 DIAGNOSIS — I340 Nonrheumatic mitral (valve) insufficiency: Secondary | ICD-10-CM | POA: Diagnosis not present

## 2022-08-16 DIAGNOSIS — Z87891 Personal history of nicotine dependence: Secondary | ICD-10-CM | POA: Diagnosis not present

## 2022-08-16 DIAGNOSIS — I951 Orthostatic hypotension: Secondary | ICD-10-CM

## 2022-08-16 DIAGNOSIS — J449 Chronic obstructive pulmonary disease, unspecified: Secondary | ICD-10-CM | POA: Diagnosis not present

## 2022-08-16 DIAGNOSIS — E785 Hyperlipidemia, unspecified: Secondary | ICD-10-CM | POA: Diagnosis not present

## 2022-08-16 DIAGNOSIS — I10 Essential (primary) hypertension: Secondary | ICD-10-CM

## 2022-08-16 DIAGNOSIS — I739 Peripheral vascular disease, unspecified: Secondary | ICD-10-CM | POA: Diagnosis not present

## 2022-08-16 DIAGNOSIS — G20 Parkinson's disease: Secondary | ICD-10-CM

## 2022-08-16 DIAGNOSIS — I4811 Longstanding persistent atrial fibrillation: Secondary | ICD-10-CM

## 2022-08-16 NOTE — Progress Notes (Signed)
Office Visit    Patient Name: Cheryl Atkinson Date of Encounter: 08/16/2022  Primary Care Provider:  Marin Olp, MD Primary Cardiologist:  Quay Burow, MD  Chief Complaint    88 year old female with a history of persistent atrial fibrillation, mitral valve regurgitation, atrial shunt, PAD s/p stenting of the right common iliac artery and right SFA, hypertension, hyperlipidemia, COPD, tobacco use, Parkinson's disease, restless leg syndrome, and perihepatic/subdiaphragmatic abscess with VRE who presents for post-hospital follow-up related to orthostatic hypotension.   Past Medical History    Past Medical History:  Diagnosis Date   Acute cholecystitis 12/29/2019   Anemia    Arthritis    "shoulders" (05/04/2015)   Cellulitis of right lower extremity 11/27/2013   CHF (congestive heart failure) (HCC)    Chronic lower back pain    Constipation    COPD (chronic obstructive pulmonary disease) (HCC)    GERD (gastroesophageal reflux disease)    History of hiatal hernia    HLD (hyperlipidemia)    Hypertension    Insomnia    Osteoporosis    Peripheral arterial disease (HCC)    Restless leg syndrome    Thyroid disease    Past Surgical History:  Procedure Laterality Date   ABDOMINAL AORTAGRAM  05/04/2015   Procedure: Abdominal Aortagram;  Surgeon: Lorretta Harp, MD;  Location: [facility];  Service: Cardiovascular;;   APPENDECTOMY     CATARACT EXTRACTION W/ INTRAOCULAR LENS  IMPLANT, BILATERAL Bilateral    CHOLECYSTECTOMY N/A 04/04/2020   Procedure: LAPAROSCOPIC CHOLECYSTECTOMY;  Surgeon: Georganna Skeans, MD;  Location: [place];  Service: General;  Laterality: N/A;   I & D EXTREMITY Right 12/22/2016   Procedure: IRRIGATION AND DEBRIDEMENT EXTREMITY;  Surgeon: Milly Jakob, MD;  Location: [place];  Service: Orthopedics;  Laterality: Right;   IR EXCHANGE BILIARY DRAIN  03/13/2020   IR PATIENT EVAL TECH 0-60 MINS  12/30/2019   IR PERC CHOLECYSTOSTOMY  01/27/2020   IR  RADIOLOGIST EVAL & MGMT  05/16/2020   PERIPHERAL VASCULAR CATHETERIZATION N/A 05/04/2015   Procedure: Lower Extremity Angiography;  Surgeon: Lorretta Harp, MD;  Location: [facility];  Service: Cardiovascular;  Laterality: N/A;   PERIPHERAL VASCULAR CATHETERIZATION  05/04/2015   Procedure: Peripheral Vascular Intervention;  Surgeon: Lorretta Harp, MD;  Location: [facility];  Service: Cardiovascular;;  RCIA - 7x22 ICAST   PERIPHERAL VASCULAR CATHETERIZATION Right 09/04/2015   Procedure: Peripheral Vascular Atherectomy;  Surgeon: Lorretta Harp, MD;  Location: [facility];  Service: Cardiovascular;  Laterality: Right;  SFA   sfa Right 09/04/2015   de balloon   THYROID SURGERY Right ?2013   "had goiter taken off my neck"   TONSILLECTOMY     VAGINAL HYSTERECTOMY      Allergies  Allergies  Allergen Reactions   Codeine     hallucinations    History of Present Illness    88 year old female with the above past medical history including persistent atrial fibrillation, mitral valve regurgitation, atrial shunt, PAD s/p stenting of the right common iliac artery and right SFA, hypertension, hyperlipidemia, COPD, tobacco use, Parkinson's disease, restless leg syndrome, and perihepatic/ subdiaphragmatic abscess with VRE.    She has a history of longstanding persistent atrial fibrillation on Eliquis.  Most recent echocardiogram in 05/2020 showed EF 60 to 65%, normal LV function, no RWMA, moderate diastolic parameters normal RV systolic function, mildly elevated PASP bilateral atrial enlargement, mild to moderate mitral valve regurgitation, mild MAC, atrial level  shunting, small PFO dominantly left-to-right shunting across the atrial septum.  She was last seen in the office on 06/27/2022 and was stable from a cardiac standpoint.  She noted elevated blood pressure readings in the setting of increased personal stress.  However, given advanced age, permissive blood pressure with SBP less  than 160 was recommended.  ABIs in 07/2022 showed slight progression of bilateral PAD, patent stent to SFA, occluded distal SFA.  Ultrasound of the aorta/iliac arteries showed patent right common iliac artery stent.  Repeat study was recommended in 1 year. She was evaluated in the ED on 08/06/2022 following a mechanical fall.  CT of the head/neck/spine was unremarkable.  She was discharged home in stable condition.  She returned to the ED on 08/11/2022 following a near syncopal episode in the setting of dizziness.  Orthostatics were positive.  EKG was stable.  Troponin was negative.  Chest x-ray was concerning for possible pneumonia.  She was prescribed oral antibiotics.  She was advised to follow-up with cardiology as an outpatient and was discharged home in stable condition. Of note, [facility] is following the patient for safety.   She presents today for follow-up accompanied by her caregiver and sister-in-law.  Since her last visit and since her recent ED visit she has been stable from a cardiac standpoint.  She denies any recurrent presyncope, denies syncope, palpitations, dizziness.  She does have stable chronic dyspnea on exertion, she otherwise denies symptoms concerning for angina.  She denies any more recent falls, denies bleeding on Eliquis.  Other than her recent presyncope, she reports feeling well and denies any additional concerns today.  Home Medications    Current Outpatient Medications  Medication Sig Dispense Refill   Albuterol Sulfate (PROAIR RESPICLICK) 923 (90 Base) MCG/ACT AEPB Inhale 2 puffs into the lungs every 6 (six) hours as needed (shortness of breath from COPD). 1 each 5   amLODipine (NORVASC) 5 MG tablet Take 1 tablet (5 mg total) by mouth daily. 30 tablet 5   apixaban (ELIQUIS) 2.5 MG TABS tablet Take 1 tablet (2.5 mg total) by mouth 2 (two) times daily. 180 tablet 3   CALCIUM PO Take by mouth.     carbidopa-levodopa (SINEMET IR) 25-100 MG tablet TAKE 1 TABLET BY MOUTH DAILY AS  NEEDED FOR WORSENING RLS 90 tablet 1   Cholecalciferol (VITAMIN D3 PO) Take by mouth.     doxycycline (VIBRAMYCIN) 100 MG capsule Take 1 capsule (100 mg total) by mouth 2 (two) times daily for 7 days. 14 capsule 0   ferrous sulfate 325 (65 FE) MG tablet Take 325 mg by mouth 2 (two) times a week. Patient takes one tablet on Monday and Friday     furosemide (LASIX) 40 MG tablet TAKE 1 TABLET TWICE A DAY 180 tablet 3   irbesartan (AVAPRO) 300 MG tablet Take 1 tablet (300 mg total) by mouth daily. 14 tablet 0   LINZESS 145 MCG CAPS capsule Take 145 mcg by mouth daily as needed (constipation).     Multiple Vitamins-Minerals (PRESERVISION AREDS 2 PO) Take 1 tablet by mouth 2 (two) times daily.     polyethylene glycol (MIRALAX / GLYCOLAX) 17 g packet Take 17 g by mouth 2 (two) times daily. Reported on 03/14/2016 (Patient taking differently: Take 17 g by mouth daily as needed for mild constipation.)     potassium chloride SA (KLOR-CON) 10 MEQ tablet Take 1 tablet (10 mEq total) by mouth 2 (two) times daily as needed. Needs to be taken  With each dose of lasix     rosuvastatin (CRESTOR) 40 MG tablet TAKE 1 TABLET DAILY 90 tablet 3   Rotigotine (NEUPRO) 3 MG/24HR PT24 PLACE 1 PATCH (3 MG) ONTO THE SKIN AT BEDTIME (Patient taking differently: Place 3 mg onto the skin at bedtime.) 90 patch 3   traMADol (ULTRAM) 50 MG tablet Take 1 tablet (50 mg total) by mouth every 6 (six) hours as needed. 30 tablet 0   traZODone (DESYREL) 50 MG tablet Take 1.5 tablets (75 mg total) by mouth at bedtime as needed for sleep. 135 tablet 3   vitamin B-12 (CYANOCOBALAMIN) 1000 MCG tablet Take 1,000 mcg by mouth daily.     No current facility-administered medications for this visit.     Review of Systems    She denies chest pain, palpitations, dyspnea, pnd, orthopnea, n, v, dizziness, syncope, edema, weight gain, or early satiety. All other systems reviewed and are otherwise negative except as noted above.   Physical Exam     VS:  BP (!) 147/79   Pulse 79   Ht _0  (1.676 m)   Wt 144 lb 3.2 oz (65.4 kg)   LMP  (LMP Unknown)   SpO2 95%   BMI 23.27 kg/m   GEN: Well nourished, well developed, in no acute distress. HEENT: normal. Neck: Supple, no JVD, carotid bruits, or masses. Cardiac: IRIR, no murmurs, rubs, or gallops. No clubbing, cyanosis, edema.  Radials/DP/PT 2+ and equal bilaterally.  Respiratory:  Respirations regular and unlabored, clear to auscultation bilaterally. GI: Soft, nontender, nondistended, BS + x 4. MS: no deformity or atrophy. Skin: warm and dry, no rash. Neuro:  Strength and sensation are intact. Psych: Normal affect.  Accessory Clinical Findings    ECG personally reviewed by me today -atrial fibrillation, 79 bpm  - no acute changes.   Lab Results  Component Value Date   WBC 6.7 08/11/2022   HGB 12.3 08/11/2022   HCT 37.7 08/11/2022   MCV 92.9 08/11/2022   PLT 216 08/11/2022   Lab Results  Component Value Date   CREATININE 1.31 (H) 08/11/2022   BUN 27 (H) 08/11/2022   NA 145 08/11/2022   K 3.9 08/11/2022   CL 103 08/11/2022   CO2 29 08/11/2022   Lab Results  Component Value Date   ALT 13 08/06/2022   AST 19 08/06/2022   ALKPHOS 78 08/06/2022   BILITOT 0.5 08/06/2022   Lab Results  Component Value Date   CHOL 163 02/12/2022   HDL 86.70 02/12/2022   LDLCALC 68 02/12/2022   LDLDIRECT 60.0 10/09/2021   TRIG 45.0 02/12/2022   CHOLHDL 2 02/12/2022    Lab Results  Component Value Date   HGBA1C 6.4 02/12/2022    Assessment & Plan    1. Orthostatic hypotension/hypertension:  Recent near-syncopal episode which prompted ED visit. Orthostatics were positive.  EKG was stable.  Troponin was negative.  Chest x-ray was concerning for possible pneumonia.  He denies any recurrent presyncope, denies syncope, palpitations, dizziness.  Discussed possibility of repeat echocardiogram, possible outpatient cardiac monitor, however, patient declines at this time.  We will  continue to allow for permissive hypertension given likely orthostatic dizziness (goal SBP <160). Discussed ED precautions, gradual positional changes, adequate hydration, thigh sleeves and abdominal compression.  Continue to monitor symptoms.  2. Persistent atrial fibrillation: Rate controlled. Asymptomatic. Continue Eliquis.   3. Mitral valve regurgitation: Most recent echo in 05/2020 showed EF 60 to 65%, normal LV function, no RWMA, moderate diastolic parameters  normal RV systolic function, mildly elevated PASP bilateral atrial enlargement, mild to moderate mitral valve regurgitation, mild MAC, atrial level shunting, small PFO dominantly left-to-right shunting across the atrial septum.  She declines repeat echo at this time.  4. PAD: ABIs in 07/2022 showed slight progression of bilateral PAD, patent stent to SFA, occluded distal SFA.  Ultrasound of the aorta/iliac arteries showed patent right common iliac artery stent.  Repeat study recommended in 1 year.   5. Hyperlipidemia: LDL was 68 in 01/2022. Monitored and managed per PCP.  Continue Crestor.  6. COPD/prior tobacco use: She reports stable chronic dyspnea, she no longer smokes.  7. Parkinson's disease: Following with neurology.  8. Disposition: Follow-up in August 2024 with Dr. Gwenlyn Found.    Lenna Sciara, NP 08/16/2022, 10:48 AM

## 2022-08-16 NOTE — Patient Instructions (Signed)
Medication Instructions:  Your physician recommends that you continue on your current medications as directed. Please refer to the Current Medication list given to you today.  *If you need a refill on your cardiac medications before your next appointment, please call your pharmacy*   Lab Work: NONE ordered at this time of appointment   If you have labs (blood work) drawn today and your tests are completely normal, you will receive your results only by: MyChart Message (if you have MyChart) OR A paper copy in the mail If you have any lab test that is abnormal or we need to change your treatment, we will call you to review the results.   Testing/Procedures: NONE ordered at this time of appointment     Follow-Up: At [facility], you and your health needs are our priority.  As part of our continuing mission to provide you with exceptional heart care, we have created designated Provider Care Teams.  These Care Teams include your primary Cardiologist (physician) and Advanced Practice Providers (APPs -  Physician Assistants and Nurse Practitioners) who all work together to provide you with the care you need, when you need it.  We recommend signing up for the patient portal called "MyChart".  Sign up information is provided on this After Visit Summary.  MyChart is used to connect with patients for Virtual Visits (Telemedicine).  Patients are able to view lab/test results, encounter notes, upcoming appointments, etc.  Non-urgent messages can be sent to your provider as well.   To learn more about what you can do with MyChart, go to [email or web address].    Your next appointment:   11 month(s)  The format for your next appointment:   In Person  Provider:   [name], MD     Other Instructions   Important Information About Sugar

## 2022-08-19 DIAGNOSIS — J449 Chronic obstructive pulmonary disease, unspecified: Secondary | ICD-10-CM | POA: Diagnosis not present

## 2022-08-19 DIAGNOSIS — I4891 Unspecified atrial fibrillation: Secondary | ICD-10-CM | POA: Diagnosis not present

## 2022-08-19 DIAGNOSIS — I110 Hypertensive heart disease with heart failure: Secondary | ICD-10-CM | POA: Diagnosis not present

## 2022-08-19 DIAGNOSIS — I5020 Unspecified systolic (congestive) heart failure: Secondary | ICD-10-CM | POA: Diagnosis not present

## 2022-08-19 DIAGNOSIS — G20 Parkinson's disease: Secondary | ICD-10-CM | POA: Diagnosis not present

## 2022-08-19 DIAGNOSIS — D509 Iron deficiency anemia, unspecified: Secondary | ICD-10-CM | POA: Diagnosis not present

## 2022-08-20 ENCOUNTER — Encounter (INDEPENDENT_AMBULATORY_CARE_PROVIDER_SITE_OTHER): Payer: Medicare Other | Admitting: Ophthalmology

## 2022-08-20 DIAGNOSIS — H43813 Vitreous degeneration, bilateral: Secondary | ICD-10-CM

## 2022-08-20 DIAGNOSIS — H35033 Hypertensive retinopathy, bilateral: Secondary | ICD-10-CM | POA: Diagnosis not present

## 2022-08-20 DIAGNOSIS — H353221 Exudative age-related macular degeneration, left eye, with active choroidal neovascularization: Secondary | ICD-10-CM | POA: Diagnosis not present

## 2022-08-20 DIAGNOSIS — H353112 Nonexudative age-related macular degeneration, right eye, intermediate dry stage: Secondary | ICD-10-CM

## 2022-08-20 DIAGNOSIS — I10 Essential (primary) hypertension: Secondary | ICD-10-CM | POA: Diagnosis not present

## 2022-08-22 ENCOUNTER — Encounter: Payer: Self-pay | Admitting: Family Medicine

## 2022-08-22 ENCOUNTER — Ambulatory Visit (INDEPENDENT_AMBULATORY_CARE_PROVIDER_SITE_OTHER): Payer: Medicare Other | Admitting: Family Medicine

## 2022-08-22 ENCOUNTER — Other Ambulatory Visit (INDEPENDENT_AMBULATORY_CARE_PROVIDER_SITE_OTHER): Payer: Medicare Other

## 2022-08-22 VITALS — BP 130/70 | HR 89 | Temp 97.1°F | Ht 66.0 in | Wt 146.6 lb

## 2022-08-22 DIAGNOSIS — I701 Atherosclerosis of renal artery: Secondary | ICD-10-CM | POA: Diagnosis not present

## 2022-08-22 DIAGNOSIS — R739 Hyperglycemia, unspecified: Secondary | ICD-10-CM

## 2022-08-22 DIAGNOSIS — I4891 Unspecified atrial fibrillation: Secondary | ICD-10-CM

## 2022-08-22 DIAGNOSIS — I5032 Chronic diastolic (congestive) heart failure: Secondary | ICD-10-CM

## 2022-08-22 DIAGNOSIS — Z23 Encounter for immunization: Secondary | ICD-10-CM

## 2022-08-22 DIAGNOSIS — I10 Essential (primary) hypertension: Secondary | ICD-10-CM

## 2022-08-22 LAB — CBC WITH DIFFERENTIAL/PLATELET
Basophils Absolute: 0.1 10*3/uL (ref 0.0–0.1)
Basophils Relative: 1.1 % (ref 0.0–3.0)
Eosinophils Absolute: 0.1 10*3/uL (ref 0.0–0.7)
Eosinophils Relative: 1.8 % (ref 0.0–5.0)
HCT: 35.8 % — ABNORMAL LOW (ref 36.0–46.0)
Hemoglobin: 12 g/dL (ref 12.0–15.0)
Lymphocytes Relative: 25.6 % (ref 12.0–46.0)
Lymphs Abs: 1.7 10*3/uL (ref 0.7–4.0)
MCHC: 33.5 g/dL (ref 30.0–36.0)
MCV: 90.6 fl (ref 78.0–100.0)
Monocytes Absolute: 0.6 10*3/uL (ref 0.1–1.0)
Monocytes Relative: 8.7 % (ref 3.0–12.0)
Neutro Abs: 4.1 10*3/uL (ref 1.4–7.7)
Neutrophils Relative %: 62.8 % (ref 43.0–77.0)
Platelets: 213 10*3/uL (ref 150.0–400.0)
RBC: 3.95 Mil/uL (ref 3.87–5.11)
RDW: 14.1 % (ref 11.5–15.5)
WBC: 6.6 10*3/uL (ref 4.0–10.5)

## 2022-08-22 LAB — COMPREHENSIVE METABOLIC PANEL
ALT: 5 U/L (ref 0–35)
AST: 17 U/L (ref 0–37)
Albumin: 3.6 g/dL (ref 3.5–5.2)
Alkaline Phosphatase: 92 U/L (ref 39–117)
BUN: 25 mg/dL — ABNORMAL HIGH (ref 6–23)
CO2: 28 mEq/L (ref 19–32)
Calcium: 8.8 mg/dL (ref 8.4–10.5)
Chloride: 104 mEq/L (ref 96–112)
Creatinine, Ser: 0.99 mg/dL (ref 0.40–1.20)
GFR: 50.74 mL/min — ABNORMAL LOW (ref >60.00)
Glucose, Bld: 96 mg/dL (ref 70–99)
Potassium: 3.2 mEq/L — ABNORMAL LOW (ref 3.5–5.1)
Sodium: 142 mEq/L (ref 135–145)
Total Bilirubin: 0.7 mg/dL (ref 0.2–1.2)
Total Protein: 7 g/dL (ref 6.0–8.3)

## 2022-08-22 NOTE — Patient Instructions (Addendum)
Team please call [facility] with verbal order to make sure she has both PT and OT- we ordered this in may and it sounds like only receiving one of the 2 services and was told she needed the other  we discussed that even though there is stroke risk coming off eliquis with her falls anytime she gets up by herself very concerned about risks with eliquis and therefore we decided to stop the medication  Please stop by lab before you go If you have mychart- we will send your results within 3 business days of [place] receiving them.  If you do not have mychart- we will call you about results within 5 business days of [place] receiving them.  *please also note that you will see labs on mychart as soon as they post. I will later go in and write notes on them- will say "notes from Dr. Yong Channel"   Recommended follow up: Return for next already scheduled visit or sooner if needed. On halloween!
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
## 2022-08-23 DIAGNOSIS — Z9049 Acquired absence of other specified parts of digestive tract: Secondary | ICD-10-CM | POA: Diagnosis not present

## 2022-08-23 DIAGNOSIS — G2581 Restless legs syndrome: Secondary | ICD-10-CM | POA: Diagnosis not present

## 2022-08-23 DIAGNOSIS — Z9181 History of falling: Secondary | ICD-10-CM | POA: Diagnosis not present

## 2022-08-23 DIAGNOSIS — K219 Gastro-esophageal reflux disease without esophagitis: Secondary | ICD-10-CM | POA: Diagnosis not present

## 2022-08-23 DIAGNOSIS — G20 Parkinson's disease: Secondary | ICD-10-CM | POA: Diagnosis not present

## 2022-08-23 DIAGNOSIS — M48061 Spinal stenosis, lumbar region without neurogenic claudication: Secondary | ICD-10-CM | POA: Diagnosis not present

## 2022-08-23 DIAGNOSIS — Z8616 Personal history of COVID-19: Secondary | ICD-10-CM | POA: Diagnosis not present

## 2022-08-23 DIAGNOSIS — I701 Atherosclerosis of renal artery: Secondary | ICD-10-CM | POA: Diagnosis not present

## 2022-08-23 DIAGNOSIS — I4891 Unspecified atrial fibrillation: Secondary | ICD-10-CM | POA: Diagnosis not present

## 2022-08-23 DIAGNOSIS — M25511 Pain in right shoulder: Secondary | ICD-10-CM | POA: Diagnosis not present

## 2022-08-23 DIAGNOSIS — D509 Iron deficiency anemia, unspecified: Secondary | ICD-10-CM | POA: Diagnosis not present

## 2022-08-23 DIAGNOSIS — I5020 Unspecified systolic (congestive) heart failure: Secondary | ICD-10-CM | POA: Diagnosis not present

## 2022-08-23 DIAGNOSIS — M25512 Pain in left shoulder: Secondary | ICD-10-CM | POA: Diagnosis not present

## 2022-08-23 DIAGNOSIS — I77811 Abdominal aortic ectasia: Secondary | ICD-10-CM | POA: Diagnosis not present

## 2022-08-23 DIAGNOSIS — Z7901 Long term (current) use of anticoagulants: Secondary | ICD-10-CM | POA: Diagnosis not present

## 2022-08-23 DIAGNOSIS — I110 Hypertensive heart disease with heart failure: Secondary | ICD-10-CM | POA: Diagnosis not present

## 2022-08-23 DIAGNOSIS — E785 Hyperlipidemia, unspecified: Secondary | ICD-10-CM | POA: Diagnosis not present

## 2022-08-23 DIAGNOSIS — E042 Nontoxic multinodular goiter: Secondary | ICD-10-CM | POA: Diagnosis not present

## 2022-08-23 DIAGNOSIS — G4700 Insomnia, unspecified: Secondary | ICD-10-CM | POA: Diagnosis not present

## 2022-08-23 DIAGNOSIS — K5909 Other constipation: Secondary | ICD-10-CM | POA: Diagnosis not present

## 2022-08-23 DIAGNOSIS — Z87891 Personal history of nicotine dependence: Secondary | ICD-10-CM | POA: Diagnosis not present

## 2022-08-23 DIAGNOSIS — J449 Chronic obstructive pulmonary disease, unspecified: Secondary | ICD-10-CM | POA: Diagnosis not present

## 2022-08-23 DIAGNOSIS — F329 Major depressive disorder, single episode, unspecified: Secondary | ICD-10-CM | POA: Diagnosis not present

## 2022-08-23 DIAGNOSIS — Z87440 Personal history of urinary (tract) infections: Secondary | ICD-10-CM | POA: Diagnosis not present

## 2022-08-26 LAB — HEMOGLOBIN A1C: Hgb A1c MFr Bld: 6.5 % (ref 4.6–6.5)

## 2022-08-28 ENCOUNTER — Telehealth: Payer: Self-pay | Admitting: Family Medicine

## 2022-08-28 NOTE — Telephone Encounter (Signed)
\   Is this a Certification or Plan of Care? y [facility] Agency: [facility] Order Number:   [id number] Has charge sheet been attached? y Where has form been placed:   Provider's bo Faxed to:

## 2022-08-29 DIAGNOSIS — I5020 Unspecified systolic (congestive) heart failure: Secondary | ICD-10-CM | POA: Diagnosis not present

## 2022-08-29 DIAGNOSIS — I110 Hypertensive heart disease with heart failure: Secondary | ICD-10-CM | POA: Diagnosis not present

## 2022-08-29 DIAGNOSIS — M48061 Spinal stenosis, lumbar region without neurogenic claudication: Secondary | ICD-10-CM | POA: Diagnosis not present

## 2022-08-29 DIAGNOSIS — G20 Parkinson's disease: Secondary | ICD-10-CM | POA: Diagnosis not present

## 2022-08-29 DIAGNOSIS — M25511 Pain in right shoulder: Secondary | ICD-10-CM | POA: Diagnosis not present

## 2022-08-29 DIAGNOSIS — M25512 Pain in left shoulder: Secondary | ICD-10-CM | POA: Diagnosis not present

## 2022-08-29 NOTE — Telephone Encounter (Signed)
Noted  

## 2022-08-30 DIAGNOSIS — M25512 Pain in left shoulder: Secondary | ICD-10-CM | POA: Diagnosis not present

## 2022-08-30 DIAGNOSIS — I110 Hypertensive heart disease with heart failure: Secondary | ICD-10-CM | POA: Diagnosis not present

## 2022-08-30 DIAGNOSIS — I5020 Unspecified systolic (congestive) heart failure: Secondary | ICD-10-CM | POA: Diagnosis not present

## 2022-08-30 DIAGNOSIS — M48061 Spinal stenosis, lumbar region without neurogenic claudication: Secondary | ICD-10-CM | POA: Diagnosis not present

## 2022-08-30 DIAGNOSIS — M25511 Pain in right shoulder: Secondary | ICD-10-CM | POA: Diagnosis not present

## 2022-08-30 DIAGNOSIS — G20 Parkinson's disease: Secondary | ICD-10-CM | POA: Diagnosis not present

## 2022-09-02 DIAGNOSIS — M48061 Spinal stenosis, lumbar region without neurogenic claudication: Secondary | ICD-10-CM | POA: Diagnosis not present

## 2022-09-02 DIAGNOSIS — M25511 Pain in right shoulder: Secondary | ICD-10-CM | POA: Diagnosis not present

## 2022-09-02 DIAGNOSIS — I5020 Unspecified systolic (congestive) heart failure: Secondary | ICD-10-CM | POA: Diagnosis not present

## 2022-09-02 DIAGNOSIS — I110 Hypertensive heart disease with heart failure: Secondary | ICD-10-CM | POA: Diagnosis not present

## 2022-09-02 DIAGNOSIS — M25512 Pain in left shoulder: Secondary | ICD-10-CM | POA: Diagnosis not present

## 2022-09-02 DIAGNOSIS — G20 Parkinson's disease: Secondary | ICD-10-CM | POA: Diagnosis not present

## 2022-09-06 ENCOUNTER — Telehealth: Payer: Self-pay | Admitting: Family Medicine

## 2022-09-06 NOTE — Telephone Encounter (Signed)
Home Health Verbal Orders  Agency:  [facility]  Caller: Eual Fines [phone]   Requesting OT/ PT/ Skilled nursing/ Social Work/ Speech:  OT  Reason for Request:  Working on posture and ADLs Patient also had bed bugs which is cause for change in frequency. This will continue until issue is cleared.  Frequency:  Once a week for 1 week Twice a week for 1 week  Once a week x 1 week

## 2022-09-06 NOTE — Telephone Encounter (Signed)
Called and lm on Melissa's Confidential vm with VO.

## 2022-09-09 DIAGNOSIS — I5020 Unspecified systolic (congestive) heart failure: Secondary | ICD-10-CM | POA: Diagnosis not present

## 2022-09-09 DIAGNOSIS — M25512 Pain in left shoulder: Secondary | ICD-10-CM | POA: Diagnosis not present

## 2022-09-09 DIAGNOSIS — I110 Hypertensive heart disease with heart failure: Secondary | ICD-10-CM | POA: Diagnosis not present

## 2022-09-09 DIAGNOSIS — G20 Parkinson's disease: Secondary | ICD-10-CM | POA: Diagnosis not present

## 2022-09-09 DIAGNOSIS — M48061 Spinal stenosis, lumbar region without neurogenic claudication: Secondary | ICD-10-CM | POA: Diagnosis not present

## 2022-09-09 DIAGNOSIS — M25511 Pain in right shoulder: Secondary | ICD-10-CM | POA: Diagnosis not present

## 2022-09-10 DIAGNOSIS — G20 Parkinson's disease: Secondary | ICD-10-CM | POA: Diagnosis not present

## 2022-09-10 DIAGNOSIS — M48061 Spinal stenosis, lumbar region without neurogenic claudication: Secondary | ICD-10-CM | POA: Diagnosis not present

## 2022-09-10 DIAGNOSIS — I110 Hypertensive heart disease with heart failure: Secondary | ICD-10-CM | POA: Diagnosis not present

## 2022-09-10 DIAGNOSIS — M25512 Pain in left shoulder: Secondary | ICD-10-CM | POA: Diagnosis not present

## 2022-09-10 DIAGNOSIS — M25511 Pain in right shoulder: Secondary | ICD-10-CM | POA: Diagnosis not present

## 2022-09-10 DIAGNOSIS — I5020 Unspecified systolic (congestive) heart failure: Secondary | ICD-10-CM | POA: Diagnosis not present

## 2022-09-12 DIAGNOSIS — I5020 Unspecified systolic (congestive) heart failure: Secondary | ICD-10-CM | POA: Diagnosis not present

## 2022-09-12 DIAGNOSIS — G20 Parkinson's disease: Secondary | ICD-10-CM | POA: Diagnosis not present

## 2022-09-12 DIAGNOSIS — M48061 Spinal stenosis, lumbar region without neurogenic claudication: Secondary | ICD-10-CM | POA: Diagnosis not present

## 2022-09-12 DIAGNOSIS — M25511 Pain in right shoulder: Secondary | ICD-10-CM | POA: Diagnosis not present

## 2022-09-12 DIAGNOSIS — M25512 Pain in left shoulder: Secondary | ICD-10-CM | POA: Diagnosis not present

## 2022-09-12 DIAGNOSIS — I110 Hypertensive heart disease with heart failure: Secondary | ICD-10-CM | POA: Diagnosis not present

## 2022-09-16 ENCOUNTER — Telehealth: Payer: Self-pay | Admitting: Family Medicine

## 2022-09-16 DIAGNOSIS — G20 Parkinson's disease: Secondary | ICD-10-CM | POA: Diagnosis not present

## 2022-09-16 DIAGNOSIS — M48061 Spinal stenosis, lumbar region without neurogenic claudication: Secondary | ICD-10-CM | POA: Diagnosis not present

## 2022-09-16 DIAGNOSIS — I110 Hypertensive heart disease with heart failure: Secondary | ICD-10-CM | POA: Diagnosis not present

## 2022-09-16 DIAGNOSIS — M25511 Pain in right shoulder: Secondary | ICD-10-CM | POA: Diagnosis not present

## 2022-09-16 DIAGNOSIS — M25512 Pain in left shoulder: Secondary | ICD-10-CM | POA: Diagnosis not present

## 2022-09-16 DIAGNOSIS — I5020 Unspecified systolic (congestive) heart failure: Secondary | ICD-10-CM | POA: Diagnosis not present

## 2022-09-16 NOTE — Telephone Encounter (Signed)
Called and spoke with Melissa and VO provided.

## 2022-09-16 NOTE — Telephone Encounter (Signed)
Agency:   [facility]  Caller: Melissa Willeford  Requesting OT/ PT/ Skilled nursing/ Social Work/ Speech:   -OT -PT -Social Work   Reason for Request:   Extend Care   Frequency:   OT:  1x/week for 1 week 2x/week for 2 weeks 1x/week for 2 weeks  Add Eval for PT and Education officer, museum

## 2022-09-17 ENCOUNTER — Encounter (INDEPENDENT_AMBULATORY_CARE_PROVIDER_SITE_OTHER): Payer: Medicare Other | Admitting: Ophthalmology

## 2022-09-17 DIAGNOSIS — G20 Parkinson's disease: Secondary | ICD-10-CM | POA: Diagnosis not present

## 2022-09-17 DIAGNOSIS — M25512 Pain in left shoulder: Secondary | ICD-10-CM | POA: Diagnosis not present

## 2022-09-17 DIAGNOSIS — M48061 Spinal stenosis, lumbar region without neurogenic claudication: Secondary | ICD-10-CM | POA: Diagnosis not present

## 2022-09-17 DIAGNOSIS — H35033 Hypertensive retinopathy, bilateral: Secondary | ICD-10-CM

## 2022-09-17 DIAGNOSIS — H43813 Vitreous degeneration, bilateral: Secondary | ICD-10-CM

## 2022-09-17 DIAGNOSIS — M25511 Pain in right shoulder: Secondary | ICD-10-CM | POA: Diagnosis not present

## 2022-09-17 DIAGNOSIS — I10 Essential (primary) hypertension: Secondary | ICD-10-CM

## 2022-09-17 DIAGNOSIS — H353112 Nonexudative age-related macular degeneration, right eye, intermediate dry stage: Secondary | ICD-10-CM | POA: Diagnosis not present

## 2022-09-17 DIAGNOSIS — I110 Hypertensive heart disease with heart failure: Secondary | ICD-10-CM | POA: Diagnosis not present

## 2022-09-17 DIAGNOSIS — I5020 Unspecified systolic (congestive) heart failure: Secondary | ICD-10-CM | POA: Diagnosis not present

## 2022-09-17 DIAGNOSIS — H353221 Exudative age-related macular degeneration, left eye, with active choroidal neovascularization: Secondary | ICD-10-CM

## 2022-09-22 DIAGNOSIS — G2581 Restless legs syndrome: Secondary | ICD-10-CM | POA: Diagnosis not present

## 2022-09-22 DIAGNOSIS — K219 Gastro-esophageal reflux disease without esophagitis: Secondary | ICD-10-CM | POA: Diagnosis not present

## 2022-09-22 DIAGNOSIS — K5909 Other constipation: Secondary | ICD-10-CM | POA: Diagnosis not present

## 2022-09-22 DIAGNOSIS — Z8616 Personal history of COVID-19: Secondary | ICD-10-CM | POA: Diagnosis not present

## 2022-09-22 DIAGNOSIS — E785 Hyperlipidemia, unspecified: Secondary | ICD-10-CM | POA: Diagnosis not present

## 2022-09-22 DIAGNOSIS — M25512 Pain in left shoulder: Secondary | ICD-10-CM | POA: Diagnosis not present

## 2022-09-22 DIAGNOSIS — I5020 Unspecified systolic (congestive) heart failure: Secondary | ICD-10-CM | POA: Diagnosis not present

## 2022-09-22 DIAGNOSIS — Z7901 Long term (current) use of anticoagulants: Secondary | ICD-10-CM | POA: Diagnosis not present

## 2022-09-22 DIAGNOSIS — Z87440 Personal history of urinary (tract) infections: Secondary | ICD-10-CM | POA: Diagnosis not present

## 2022-09-22 DIAGNOSIS — M25511 Pain in right shoulder: Secondary | ICD-10-CM | POA: Diagnosis not present

## 2022-09-22 DIAGNOSIS — G4700 Insomnia, unspecified: Secondary | ICD-10-CM | POA: Diagnosis not present

## 2022-09-22 DIAGNOSIS — I701 Atherosclerosis of renal artery: Secondary | ICD-10-CM | POA: Diagnosis not present

## 2022-09-22 DIAGNOSIS — E042 Nontoxic multinodular goiter: Secondary | ICD-10-CM | POA: Diagnosis not present

## 2022-09-22 DIAGNOSIS — G20A1 Parkinson's disease without dyskinesia, without mention of fluctuations: Secondary | ICD-10-CM | POA: Diagnosis not present

## 2022-09-22 DIAGNOSIS — Z9181 History of falling: Secondary | ICD-10-CM | POA: Diagnosis not present

## 2022-09-22 DIAGNOSIS — I77811 Abdominal aortic ectasia: Secondary | ICD-10-CM | POA: Diagnosis not present

## 2022-09-22 DIAGNOSIS — M48061 Spinal stenosis, lumbar region without neurogenic claudication: Secondary | ICD-10-CM | POA: Diagnosis not present

## 2022-09-22 DIAGNOSIS — J449 Chronic obstructive pulmonary disease, unspecified: Secondary | ICD-10-CM | POA: Diagnosis not present

## 2022-09-22 DIAGNOSIS — D509 Iron deficiency anemia, unspecified: Secondary | ICD-10-CM | POA: Diagnosis not present

## 2022-09-22 DIAGNOSIS — I110 Hypertensive heart disease with heart failure: Secondary | ICD-10-CM | POA: Diagnosis not present

## 2022-09-22 DIAGNOSIS — I4891 Unspecified atrial fibrillation: Secondary | ICD-10-CM | POA: Diagnosis not present

## 2022-09-22 DIAGNOSIS — Z87891 Personal history of nicotine dependence: Secondary | ICD-10-CM | POA: Diagnosis not present

## 2022-09-22 DIAGNOSIS — Z9049 Acquired absence of other specified parts of digestive tract: Secondary | ICD-10-CM | POA: Diagnosis not present

## 2022-09-22 DIAGNOSIS — F329 Major depressive disorder, single episode, unspecified: Secondary | ICD-10-CM | POA: Diagnosis not present

## 2022-09-23 DIAGNOSIS — I5020 Unspecified systolic (congestive) heart failure: Secondary | ICD-10-CM | POA: Diagnosis not present

## 2022-09-23 DIAGNOSIS — I110 Hypertensive heart disease with heart failure: Secondary | ICD-10-CM | POA: Diagnosis not present

## 2022-09-23 DIAGNOSIS — G20A1 Parkinson's disease without dyskinesia, without mention of fluctuations: Secondary | ICD-10-CM | POA: Diagnosis not present

## 2022-09-23 DIAGNOSIS — M25511 Pain in right shoulder: Secondary | ICD-10-CM | POA: Diagnosis not present

## 2022-09-23 DIAGNOSIS — M48061 Spinal stenosis, lumbar region without neurogenic claudication: Secondary | ICD-10-CM | POA: Diagnosis not present

## 2022-09-23 DIAGNOSIS — M25512 Pain in left shoulder: Secondary | ICD-10-CM | POA: Diagnosis not present

## 2022-09-25 DIAGNOSIS — M25512 Pain in left shoulder: Secondary | ICD-10-CM | POA: Diagnosis not present

## 2022-09-25 DIAGNOSIS — G20A1 Parkinson's disease without dyskinesia, without mention of fluctuations: Secondary | ICD-10-CM | POA: Diagnosis not present

## 2022-09-25 DIAGNOSIS — M48061 Spinal stenosis, lumbar region without neurogenic claudication: Secondary | ICD-10-CM | POA: Diagnosis not present

## 2022-09-25 DIAGNOSIS — I5020 Unspecified systolic (congestive) heart failure: Secondary | ICD-10-CM | POA: Diagnosis not present

## 2022-09-25 DIAGNOSIS — M25511 Pain in right shoulder: Secondary | ICD-10-CM | POA: Diagnosis not present

## 2022-09-25 DIAGNOSIS — I110 Hypertensive heart disease with heart failure: Secondary | ICD-10-CM | POA: Diagnosis not present

## 2022-09-26 DIAGNOSIS — M48061 Spinal stenosis, lumbar region without neurogenic claudication: Secondary | ICD-10-CM | POA: Diagnosis not present

## 2022-09-26 DIAGNOSIS — G20A1 Parkinson's disease without dyskinesia, without mention of fluctuations: Secondary | ICD-10-CM | POA: Diagnosis not present

## 2022-09-26 DIAGNOSIS — M25511 Pain in right shoulder: Secondary | ICD-10-CM | POA: Diagnosis not present

## 2022-09-26 DIAGNOSIS — I5020 Unspecified systolic (congestive) heart failure: Secondary | ICD-10-CM | POA: Diagnosis not present

## 2022-09-26 DIAGNOSIS — I110 Hypertensive heart disease with heart failure: Secondary | ICD-10-CM | POA: Diagnosis not present

## 2022-09-26 DIAGNOSIS — M25512 Pain in left shoulder: Secondary | ICD-10-CM | POA: Diagnosis not present

## 2022-10-01 ENCOUNTER — Ambulatory Visit (INDEPENDENT_AMBULATORY_CARE_PROVIDER_SITE_OTHER): Payer: Medicare Other | Admitting: Family Medicine

## 2022-10-01 ENCOUNTER — Encounter: Payer: Self-pay | Admitting: Family Medicine

## 2022-10-01 VITALS — BP 130/74 | HR 87 | Temp 98.6°F | Ht 66.0 in | Wt 146.6 lb

## 2022-10-01 DIAGNOSIS — I701 Atherosclerosis of renal artery: Secondary | ICD-10-CM

## 2022-10-01 DIAGNOSIS — I4891 Unspecified atrial fibrillation: Secondary | ICD-10-CM

## 2022-10-01 DIAGNOSIS — I5032 Chronic diastolic (congestive) heart failure: Secondary | ICD-10-CM | POA: Diagnosis not present

## 2022-10-01 DIAGNOSIS — F331 Major depressive disorder, recurrent, moderate: Secondary | ICD-10-CM | POA: Diagnosis not present

## 2022-10-01 DIAGNOSIS — I10 Essential (primary) hypertension: Secondary | ICD-10-CM | POA: Diagnosis not present

## 2022-10-01 MED ORDER — PROAIR RESPICLICK 108 (90 BASE) MCG/ACT IN AEPB
2.0000 | INHALATION_SPRAY | Freq: Four times a day (QID) | RESPIRATORY_TRACT | 5 refills | Status: DC | PRN
Start: 2022-10-01 — End: 2023-01-09

## 2022-10-01 MED ORDER — MIRTAZAPINE 7.5 MG PO TABS: 7.5000 mg | ORAL_TABLET | Freq: Every day | ORAL | 5 refills | Status: DC

## 2022-10-01 NOTE — Progress Notes (Signed)
Phone [phone] In person visit   Subjective:   Cheryl Atkinson is a 88 y.o. year old very pleasant female patient who presents for/with See problem oriented charting Chief Complaint  Patient presents with   Follow-up   Hypertension   Insomnia    Pt c/o sleep issues and wants to know if she can take something she has seen on TV called RELAXIUM.   Depression    Home health wants to see if pt can be put on a depression pill.   Past Medical History-  Patient Active Problem List   Diagnosis Date Noted   Pleural effusion due to CHF (congestive heart failure) ([place]) 05/26/2020    Priority: High   Heart failure with preserved ejection fraction ([place]) 03/01/2020    Priority: High   Bacteremia 01/28/2020    Priority: High   Atrial fibrillation ([place]) 01/25/2020    Priority: High   Memory loss 04/11/2016    Priority: High   Renal artery stenosis ([place]) 09/27/2015    Priority: High   Claudication ([place]) 05/04/2015    Priority: High   Atherosclerotic PVD with intermittent claudication ([place]) 04/26/2015    Priority: High   DNR (do not resuscitate) 09/29/2014    Priority: High   Hyperglycemia 07/19/2014    Priority: High   LOW BACK PAIN 09/25/2007    Priority: High   B12 deficiency 02/27/2021    Priority: Medium    Insomnia 03/01/2020    Priority: Medium    Major depression 03/25/2018    Priority: Medium    BPPV (benign paroxysmal positional vertigo) 07/12/2016    Priority: Medium    Hyperlipidemia 06/30/2015    Priority: Medium    Former smoker 09/29/2014    Priority: Medium    COPD (chronic obstructive pulmonary disease) ([place]) 09/20/2009    Priority: Medium    Iron deficiency anemia 11/09/2007    Priority: Medium    RESTLESS LEG SYNDROME 09/25/2007    Priority: Medium    Essential hypertension 09/25/2007    Priority: Medium    GERD 09/25/2007    Priority: Medium    Osteoporosis 09/25/2007    Priority: Medium    COVID-19 virus infection 08/29/2021    Priority: Low    S/P laparoscopic cholecystectomy 04/04/2020    Priority: Low   Abdominal aortic ectasia ([place]) 12/29/2019    Priority: Low   Cat bite of right hand 12/21/2016    Priority: Low   Mallet toe of right foot 10/20/2015    Priority: Low   Tinnitus 12/31/2014    Priority: Low   Multinodular goiter 08/30/2013    Priority: Low   Spinal stenosis of lumbar region at multiple levels 09/29/2012    Priority: Low   HIP PAIN, BILATERAL 07/16/2010    Priority: Low   CONSTIPATION, CHRONIC 09/20/2009    Priority: Low   History of UTI 11/09/2007    Priority: Low   Renal lesion 08/29/2021    Medications- reviewed and updated Current Outpatient Medications  Medication Sig Dispense Refill   Albuterol Sulfate (PROAIR RESPICLICK) 379 (90 Base) MCG/ACT AEPB Inhale 2 puffs into the lungs every 6 (six) hours as needed (shortness of breath from COPD). 1 each 5   CALCIUM PO Take by mouth.     carbidopa-levodopa (SINEMET IR) 25-100 MG tablet TAKE 1 TABLET BY MOUTH DAILY AS NEEDED FOR WORSENING RLS 90 tablet 1   Cholecalciferol (VITAMIN D3 PO) Take by mouth.     ferrous sulfate 325 (65 FE) MG tablet  Take 325 mg by mouth 2 (two) times a week. Patient takes one tablet on Monday and Friday     furosemide (LASIX) 40 MG tablet TAKE 1 TABLET TWICE A DAY 180 tablet 3   irbesartan (AVAPRO) 300 MG tablet Take 1 tablet (300 mg total) by mouth daily. 14 tablet 0   LINZESS 145 MCG CAPS capsule Take 145 mcg by mouth daily as needed (constipation).     Multiple Vitamins-Minerals (PRESERVISION AREDS 2 PO) Take 1 tablet by mouth 2 (two) times daily.     polyethylene glycol (MIRALAX / GLYCOLAX) 17 g packet Take 17 g by mouth 2 (two) times daily. Reported on 03/14/2016 (Patient taking differently: Take 17 g by mouth daily as needed for mild constipation.)     potassium chloride SA (KLOR-CON) 10 MEQ tablet Take 1 tablet (10 mEq total) by mouth 2 (two) times daily as needed. Needs to be taken  With each dose of lasix      rosuvastatin (CRESTOR) 40 MG tablet TAKE 1 TABLET DAILY 90 tablet 3   Rotigotine (NEUPRO) 3 MG/24HR PT24 PLACE 1 PATCH (3 MG) ONTO THE SKIN AT BEDTIME (Patient taking differently: Place 3 mg onto the skin at bedtime.) 90 patch 3   traMADol (ULTRAM) 50 MG tablet Take 1 tablet (50 mg total) by mouth every 6 (six) hours as needed. 30 tablet 0   vitamin B-12 (CYANOCOBALAMIN) 1000 MCG tablet Take 1,000 mcg by mouth daily.     amLODipine (NORVASC) 5 MG tablet Take 1 tablet (5 mg total) by mouth daily. 30 tablet 5   No current facility-administered medications for this visit.     Objective:  BP 130/74   Pulse 87   Temp 98.6 F (37 C)   Ht 5\' 6"  (1.676 m)   Wt 146 lb 9.6 oz (66.5 kg)   LMP  (LMP Unknown)   SpO2 97%   BMI 23.66 kg/m  Gen: NAD, resting comfortably CV: irregularly irregular Lungs: CTAB no crackles, wheeze, rhonchi Ext: no edema Skin: warm, dry    Assessment and Plan   # Atrial fibrillation- new onset 2021 S: Rate controlled without medication  Anticoagulated with no rx- prior  eliquis 2.5 mg BID stopped due to falls Patient is  followed by cardiology: Dr. Gwenlyn Found  A/P: a fib rate controlled without medicine. Off anticoagulation due to fall risk- knows some increased stroke risk but we are more concerned due to falls at least weekly when gets up on her own- recommended NOT doing this- falls even with walker. On PT now to try to strengthen legs- with Melissa- we are happy to support any PT needs with signing off  #CHF with preserved EF  #Hypertension S: Compliant with irbesartan 300 mg (ok per cards as only unilateral renal artery stenosis) and lasix 40mg  twice daily. Also on potassium replacement- took extra after last visit - weight stable, no increased edema -off amlodiine march 2021 with edema issues and again 2022-cardiology restarted 06/27/2022 at 5 mg- doing ok with this- no increased swelling  BP Readings from Last 3 Encounters:  10/01/22 130/74  08/22/22 130/70   08/16/22 (!) 147/79  A/P: HTN_ Controlled. Continue current medications.  CHF- Controlled. Continue current medications.      #Major depression/insomnia S: Patient has denied counseling and medications in the past but today is potentially interested in medication  -We also discussed therapy- only mild improvement- few hours  -   She asks about a medication called relaxium she has seen on  TV which includes magnesium, ashwaganda , valerian root, melatonin- recommended against -appetite ok    10/01/2022    9:11 AM 05/30/2022   12:49 PM 12/11/2021   10:41 AM  Depression screen PHQ 2/9  Decreased Interest 3 0 0  Down, Depressed, Hopeless 3 0 1  PHQ - 2 Score 6 0 1  Altered sleeping 3 3 3   Tired, decreased energy 1 0 0  Change in appetite 0 0 0  Feeling bad or failure about yourself  0 0 0  Trouble concentrating 0 0 3  Moving slowly or fidgety/restless 0 0 0  Suicidal thoughts 0 0 0  PHQ-9 Score 10 3 7   Difficult doing work/chores Somewhat difficult Not difficult at all Not difficult at all  A/P:   Depression poor control also with poor sleep- stop trazodone- trial remeron/mirtazapine 7.5mg  before bed to see if helps with sleep/depression- need to watch appetite/weight gain with this- may consider SSRI more traditional antidepressant if needed in future -has tramadol listed but 30 tablets given a year ago approximately -opts out of therapy for now- is enjoying chatting with Melissa   #Insulin resistance/hyperglycemia S: prior metformin 500 milligrams daily- diarrhea and low b12 Lab Results  Component Value Date   HGBA1C 6.5 08/22/2022   HGBA1C 6.4 02/12/2022   HGBA1C 6.4 07/17/2021  A/P: check a1c after 6 months- watch sweet and simple carb carbs   - is going to at least try whole wheat bread instead of white bread and cut down sweets. And brown rice instead of white rice - consider jardiance  - also with CHF- would have to wtach for UTIs  Recommended follow up: Return in about 6  weeks (around 11/12/2022) for followup or sooner if needed.Schedule b4 you leave. Future Appointments  Date Time Provider [place]  10/15/2022  8:30 AM Hayden Pedro, MD [facility] None  10/18/2022  8:30 AM Narda Amber K, DO [facility] None   Lab/Order associations:   ICD-10-CM   1. Chronic heart failure with preserved ejection fraction (HCC)  I50.32     2. Atrial fibrillation, unspecified type ([place])  I48.91     3. Moderate episode of recurrent major depressive disorder (HCC)  F33.1     4. Essential hypertension  I10       Meds ordered this encounter  Medications   mirtazapine (REMERON) 7.5 MG tablet    Sig: Take 1 tablet (7.5 mg total) by mouth at bedtime.    Dispense:  30 tablet    Refill:  5    Return precautions advised.  Garret Reddish, MD

## 2022-10-01 NOTE — Patient Instructions (Addendum)
Health Maintenance Due  Topic Date Due   Medicare Annual Wellness (AWV)  07/20/2022  You are eligible to schedule your annual wellness visit with our nurse specialist Otila Kluver.  Please consider scheduling this before you leave today  Depression poor control also with poor sleep- stop trazodone- trial remeron/mirtazapine 7.5mg  before bed (hoping helps depression and sleep) - need to watch appetite/weight gain with this- may consider SSRI more traditional antidepressant if needed in future  Recommended follow up: Return in about 6 weeks (around 11/12/2022) for followup or sooner if needed.Schedule b4 you leave.

## 2022-10-02 DIAGNOSIS — M25511 Pain in right shoulder: Secondary | ICD-10-CM | POA: Diagnosis not present

## 2022-10-02 DIAGNOSIS — I5020 Unspecified systolic (congestive) heart failure: Secondary | ICD-10-CM | POA: Diagnosis not present

## 2022-10-02 DIAGNOSIS — I110 Hypertensive heart disease with heart failure: Secondary | ICD-10-CM | POA: Diagnosis not present

## 2022-10-02 DIAGNOSIS — M48061 Spinal stenosis, lumbar region without neurogenic claudication: Secondary | ICD-10-CM | POA: Diagnosis not present

## 2022-10-02 DIAGNOSIS — G20A1 Parkinson's disease without dyskinesia, without mention of fluctuations: Secondary | ICD-10-CM | POA: Diagnosis not present

## 2022-10-02 DIAGNOSIS — M25512 Pain in left shoulder: Secondary | ICD-10-CM | POA: Diagnosis not present

## 2022-10-03 DIAGNOSIS — I110 Hypertensive heart disease with heart failure: Secondary | ICD-10-CM | POA: Diagnosis not present

## 2022-10-03 DIAGNOSIS — G20A1 Parkinson's disease without dyskinesia, without mention of fluctuations: Secondary | ICD-10-CM | POA: Diagnosis not present

## 2022-10-03 DIAGNOSIS — M25511 Pain in right shoulder: Secondary | ICD-10-CM | POA: Diagnosis not present

## 2022-10-03 DIAGNOSIS — M48061 Spinal stenosis, lumbar region without neurogenic claudication: Secondary | ICD-10-CM | POA: Diagnosis not present

## 2022-10-03 DIAGNOSIS — I5020 Unspecified systolic (congestive) heart failure: Secondary | ICD-10-CM | POA: Diagnosis not present

## 2022-10-03 DIAGNOSIS — M25512 Pain in left shoulder: Secondary | ICD-10-CM | POA: Diagnosis not present

## 2022-10-07 DIAGNOSIS — M48061 Spinal stenosis, lumbar region without neurogenic claudication: Secondary | ICD-10-CM | POA: Diagnosis not present

## 2022-10-07 DIAGNOSIS — G20A1 Parkinson's disease without dyskinesia, without mention of fluctuations: Secondary | ICD-10-CM | POA: Diagnosis not present

## 2022-10-07 DIAGNOSIS — I5020 Unspecified systolic (congestive) heart failure: Secondary | ICD-10-CM | POA: Diagnosis not present

## 2022-10-07 DIAGNOSIS — I110 Hypertensive heart disease with heart failure: Secondary | ICD-10-CM | POA: Diagnosis not present

## 2022-10-07 DIAGNOSIS — M25512 Pain in left shoulder: Secondary | ICD-10-CM | POA: Diagnosis not present

## 2022-10-07 DIAGNOSIS — M25511 Pain in right shoulder: Secondary | ICD-10-CM | POA: Diagnosis not present

## 2022-10-10 DIAGNOSIS — M25511 Pain in right shoulder: Secondary | ICD-10-CM | POA: Diagnosis not present

## 2022-10-10 DIAGNOSIS — M25512 Pain in left shoulder: Secondary | ICD-10-CM | POA: Diagnosis not present

## 2022-10-10 DIAGNOSIS — I5020 Unspecified systolic (congestive) heart failure: Secondary | ICD-10-CM | POA: Diagnosis not present

## 2022-10-10 DIAGNOSIS — M48061 Spinal stenosis, lumbar region without neurogenic claudication: Secondary | ICD-10-CM | POA: Diagnosis not present

## 2022-10-10 DIAGNOSIS — I110 Hypertensive heart disease with heart failure: Secondary | ICD-10-CM | POA: Diagnosis not present

## 2022-10-10 DIAGNOSIS — G20A1 Parkinson's disease without dyskinesia, without mention of fluctuations: Secondary | ICD-10-CM | POA: Diagnosis not present

## 2022-10-15 ENCOUNTER — Encounter (INDEPENDENT_AMBULATORY_CARE_PROVIDER_SITE_OTHER): Payer: Medicare Other | Admitting: Ophthalmology

## 2022-10-15 DIAGNOSIS — I10 Essential (primary) hypertension: Secondary | ICD-10-CM | POA: Diagnosis not present

## 2022-10-15 DIAGNOSIS — H35033 Hypertensive retinopathy, bilateral: Secondary | ICD-10-CM

## 2022-10-15 DIAGNOSIS — H353221 Exudative age-related macular degeneration, left eye, with active choroidal neovascularization: Secondary | ICD-10-CM

## 2022-10-15 DIAGNOSIS — H43813 Vitreous degeneration, bilateral: Secondary | ICD-10-CM | POA: Diagnosis not present

## 2022-10-15 DIAGNOSIS — H353112 Nonexudative age-related macular degeneration, right eye, intermediate dry stage: Secondary | ICD-10-CM | POA: Diagnosis not present

## 2022-10-17 DIAGNOSIS — M25511 Pain in right shoulder: Secondary | ICD-10-CM | POA: Diagnosis not present

## 2022-10-17 DIAGNOSIS — G20A1 Parkinson's disease without dyskinesia, without mention of fluctuations: Secondary | ICD-10-CM | POA: Diagnosis not present

## 2022-10-17 DIAGNOSIS — M48061 Spinal stenosis, lumbar region without neurogenic claudication: Secondary | ICD-10-CM | POA: Diagnosis not present

## 2022-10-17 DIAGNOSIS — I5020 Unspecified systolic (congestive) heart failure: Secondary | ICD-10-CM | POA: Diagnosis not present

## 2022-10-17 DIAGNOSIS — I110 Hypertensive heart disease with heart failure: Secondary | ICD-10-CM | POA: Diagnosis not present

## 2022-10-17 DIAGNOSIS — M25512 Pain in left shoulder: Secondary | ICD-10-CM | POA: Diagnosis not present

## 2022-10-17 NOTE — Progress Notes (Signed)
Follow-up Visit   Date: 10/18/22   Cheryl Atkinson MRN: [id number] DOB: 07/06/1931   Interim History: Cheryl Atkinson is a 88 y.o. right-handed Caucasian female with CHF, COPD, GERD, HTN, atrial fibrillation, PAD, and HPL returning to the clinic for follow-up of RLS.  The patient was accompanied to the clinic by caregiver Cheryl Atkinson) who also provides collateral information.    IMPRESSION/PLAN: Restless leg syndrome, worse in the setting of not taking a dopamine agonist.  In the past she has tried ropinirole. More recently, she developed augmentation on Neupro 12mg /d and symptoms have improved since being on 3mg  daily.  However, since her last visit, she mistakenly stopped taking Neupro and symptoms are worse.   - Restart Neupro 2mg  daily - ok to titrate to 3mg /d if needed - Continue sinemet 25/100 as needed - Patient to call with update in 1-2 months  2.  Apraxia of gait with intermittent freezing.  Exam does not clearly demonstrate signs of parkinsonian  - she has mild bradykinesia throughout, but this can also be age-related.  There is no tremor or rigidity.  To evaluate for parkinson's disease, I offered DAT scan, which she will think about.    - Hopefully, restarting Neupro will help  - Continue PT  - Fall precautions discussed  - I will continue to follow her   Return to clinic in 6 months.   ---------------------------------------------------------------- History of present illness: She has RLS for the past 15 years, descirbed as crawling sensation and discomfort.  It is worse at rest and improved with activity.  She takes Neupro 3mg  patch for the past several years, but does not have any ongoing benefit.  She was also on ropinirole in the past.  Upon further questioning, she tells me that over the past few months, she has been putting 2-4 patches of Neupro 3mg  on, where has she is prescribed to apply one patch only.  She does not have any benefit. She is predominately  wheelchair bound since a fall resulting in lumbar fracture earlier this year.  She walks with assistance and a walker at home.   She lives at home with her son.  Her cousin, Cheryl Atkinson, is her caregiver who is home all day.  She needs assistance with ADLs.    UPDATE 10/17/2021:  She has reduced Neupro to 3mg /daily and it took about a month to see benefit, but she has marked improvement in RLS.  She no longer kicks or moves her legs and no longer needs to walk. Previously, she would need to walk about an hour at night time and it would wake her up at night time. She takes sinemet about once per week which helps.   UPDATE 10/17/2022:  She is here for follow-up visit. She reports RLS has been worse and tends to start around 1pm.  It keeps her awake during the daytime and night time.  She takes sinemet as needed almost daily, which provides some relief.  She is not taking Neupro.  Upon further questioning, she mistakenly understood that once she started sinemet, she was to stop Neupro.  She denies tremors. She uses a walker and transport chair.  Sometimes, she has spells where the left leg does not want to move, it lasts about 2-3 seconds and then she is able to start walking again. She has had several falls, some of which were due to orthostasis.   Medications:  Current Outpatient Medications on File Prior to Visit  Medication Sig Dispense Refill  Albuterol Sulfate (PROAIR RESPICLICK) 108 (90 Base) MCG/ACT AEPB Inhale 2 puffs into the lungs every 6 (six) hours as needed (shortness of breath from COPD). 1 each 5   amLODipine (NORVASC) 5 MG tablet Take 1 tablet (5 mg total) by mouth daily. 30 tablet 5   CALCIUM PO Take by mouth.     Cholecalciferol (VITAMIN D3 PO) Take by mouth.     ferrous sulfate 325 (65 FE) MG tablet Take 325 mg by mouth 2 (two) times a week. Patient takes one tablet on Monday and Friday     furosemide (LASIX) 40 MG tablet TAKE 1 TABLET TWICE A DAY 180 tablet 3   irbesartan (AVAPRO) 300  MG tablet Take 1 tablet (300 mg total) by mouth daily. 14 tablet 0   LINZESS 145 MCG CAPS capsule Take 145 mcg by mouth daily as needed (constipation).     mirtazapine (REMERON) 7.5 MG tablet Take 1 tablet (7.5 mg total) by mouth at bedtime. 30 tablet 5   Multiple Vitamins-Minerals (PRESERVISION AREDS 2 PO) Take 1 tablet by mouth 2 (two) times daily.     polyethylene glycol (MIRALAX / GLYCOLAX) 17 g packet Take 17 g by mouth 2 (two) times daily. Reported on 03/14/2016 (Patient taking differently: Take 17 g by mouth daily as needed for mild constipation.)     potassium chloride SA (KLOR-CON) 10 MEQ tablet Take 1 tablet (10 mEq total) by mouth 2 (two) times daily as needed. Needs to be taken  With each dose of lasix     rosuvastatin (CRESTOR) 40 MG tablet TAKE 1 TABLET DAILY 90 tablet 3   vitamin B-12 (CYANOCOBALAMIN) 1000 MCG tablet Take 1,000 mcg by mouth daily.     No current facility-administered medications on file prior to visit.    Allergies:  Allergies  Allergen Reactions   Codeine     hallucinations    Vital Signs:  BP (!) 160/88   Pulse 81   Ht 5\' 6"  (1.676 m)   Wt 145 lb (65.8 kg)   LMP  (LMP Unknown)   SpO2 95%   BMI 23.40 kg/m   Neurological Exam: MENTAL STATUS including orientation to time, place, person, recent and remote memory, attention span and concentration, language, and fund of knowledge is normal.  Speech is not dysarthric.  CRANIAL NERVES:  Pupils equal round and reactive to light.  Normal conjugate, extra-ocular eye movements in all directions of gaze.  No ptosis.  Face is symmetric.   MOTOR:  Motor strength is 5-/5 throughout.  No atrophy, fasciculations or abnormal movements.  No pronator drift.  Tone is normal.  No tremor.   MSRs:  Reflexes are 3+/4 throughout, except absent at the ankles.  SENSORY:  Intact to vibration throughout.  COORDINATION/GAIT:  Normal finger-to- nose-finger.  Mild reduced amplitude of finger tapping bilaterally, rate is normal.  Gait not tested, pt in wheelchair and uses a walker at home.   Data: Lab Results  Component Value Date   FERRITIN 117 08/30/2021   Total time spent reviewing records, interview, history/exam, documentation, and coordination of care on day of encounter:  30 min    Thank you for allowing me to participate in patient's care.  If I can answer any additional questions, I would be pleased to do so.    Sincerely,    Zanovia Rotz K. 09/01/2021, DO

## 2022-10-18 ENCOUNTER — Ambulatory Visit (INDEPENDENT_AMBULATORY_CARE_PROVIDER_SITE_OTHER): Payer: Medicare Other | Admitting: Neurology

## 2022-10-18 ENCOUNTER — Encounter: Payer: Self-pay | Admitting: Neurology

## 2022-10-18 VITALS — BP 160/88 | HR 81 | Ht 66.0 in | Wt 145.0 lb

## 2022-10-18 DIAGNOSIS — R482 Apraxia: Secondary | ICD-10-CM

## 2022-10-18 DIAGNOSIS — G2581 Restless legs syndrome: Secondary | ICD-10-CM | POA: Diagnosis not present

## 2022-10-18 DIAGNOSIS — I701 Atherosclerosis of renal artery: Secondary | ICD-10-CM

## 2022-10-18 MED ORDER — NEUPRO 2 MG/24HR TD PT24: 1.0000 | MEDICATED_PATCH | Freq: Every day | TRANSDERMAL | 12 refills | Status: DC

## 2022-10-18 MED ORDER — CARBIDOPA-LEVODOPA 25-100 MG PO TABS: ORAL_TABLET | ORAL | 1 refills | Status: DC

## 2022-10-18 NOTE — Patient Instructions (Signed)
Start Neupro 2mg  patch  Continue to take sinemet as needed for severe restless syndrome  Call with an update in 1-2 months   Return to clinic 6 months

## 2022-10-21 DIAGNOSIS — G20A1 Parkinson's disease without dyskinesia, without mention of fluctuations: Secondary | ICD-10-CM | POA: Diagnosis not present

## 2022-10-21 DIAGNOSIS — M25511 Pain in right shoulder: Secondary | ICD-10-CM | POA: Diagnosis not present

## 2022-10-21 DIAGNOSIS — M48061 Spinal stenosis, lumbar region without neurogenic claudication: Secondary | ICD-10-CM | POA: Diagnosis not present

## 2022-10-21 DIAGNOSIS — I110 Hypertensive heart disease with heart failure: Secondary | ICD-10-CM | POA: Diagnosis not present

## 2022-10-21 DIAGNOSIS — I5020 Unspecified systolic (congestive) heart failure: Secondary | ICD-10-CM | POA: Diagnosis not present

## 2022-10-21 DIAGNOSIS — M25512 Pain in left shoulder: Secondary | ICD-10-CM | POA: Diagnosis not present

## 2022-10-22 DIAGNOSIS — M25511 Pain in right shoulder: Secondary | ICD-10-CM | POA: Diagnosis not present

## 2022-10-22 DIAGNOSIS — Z9049 Acquired absence of other specified parts of digestive tract: Secondary | ICD-10-CM | POA: Diagnosis not present

## 2022-10-22 DIAGNOSIS — M810 Age-related osteoporosis without current pathological fracture: Secondary | ICD-10-CM | POA: Diagnosis not present

## 2022-10-22 DIAGNOSIS — I739 Peripheral vascular disease, unspecified: Secondary | ICD-10-CM | POA: Diagnosis not present

## 2022-10-22 DIAGNOSIS — I110 Hypertensive heart disease with heart failure: Secondary | ICD-10-CM | POA: Diagnosis not present

## 2022-10-22 DIAGNOSIS — Z8616 Personal history of COVID-19: Secondary | ICD-10-CM | POA: Diagnosis not present

## 2022-10-22 DIAGNOSIS — Z87891 Personal history of nicotine dependence: Secondary | ICD-10-CM | POA: Diagnosis not present

## 2022-10-22 DIAGNOSIS — K219 Gastro-esophageal reflux disease without esophagitis: Secondary | ICD-10-CM | POA: Diagnosis not present

## 2022-10-22 DIAGNOSIS — G4700 Insomnia, unspecified: Secondary | ICD-10-CM | POA: Diagnosis not present

## 2022-10-22 DIAGNOSIS — I4891 Unspecified atrial fibrillation: Secondary | ICD-10-CM | POA: Diagnosis not present

## 2022-10-22 DIAGNOSIS — I77811 Abdominal aortic ectasia: Secondary | ICD-10-CM | POA: Diagnosis not present

## 2022-10-22 DIAGNOSIS — M48061 Spinal stenosis, lumbar region without neurogenic claudication: Secondary | ICD-10-CM | POA: Diagnosis not present

## 2022-10-22 DIAGNOSIS — G20A1 Parkinson's disease without dyskinesia, without mention of fluctuations: Secondary | ICD-10-CM | POA: Diagnosis not present

## 2022-10-22 DIAGNOSIS — M25512 Pain in left shoulder: Secondary | ICD-10-CM | POA: Diagnosis not present

## 2022-10-22 DIAGNOSIS — Z87440 Personal history of urinary (tract) infections: Secondary | ICD-10-CM | POA: Diagnosis not present

## 2022-10-22 DIAGNOSIS — E042 Nontoxic multinodular goiter: Secondary | ICD-10-CM | POA: Diagnosis not present

## 2022-10-22 DIAGNOSIS — H8110 Benign paroxysmal vertigo, unspecified ear: Secondary | ICD-10-CM | POA: Diagnosis not present

## 2022-10-22 DIAGNOSIS — I701 Atherosclerosis of renal artery: Secondary | ICD-10-CM | POA: Diagnosis not present

## 2022-10-22 DIAGNOSIS — F329 Major depressive disorder, single episode, unspecified: Secondary | ICD-10-CM | POA: Diagnosis not present

## 2022-10-22 DIAGNOSIS — G2581 Restless legs syndrome: Secondary | ICD-10-CM | POA: Diagnosis not present

## 2022-10-22 DIAGNOSIS — D509 Iron deficiency anemia, unspecified: Secondary | ICD-10-CM | POA: Diagnosis not present

## 2022-10-22 DIAGNOSIS — Z7901 Long term (current) use of anticoagulants: Secondary | ICD-10-CM | POA: Diagnosis not present

## 2022-10-22 DIAGNOSIS — I5032 Chronic diastolic (congestive) heart failure: Secondary | ICD-10-CM | POA: Diagnosis not present

## 2022-10-22 DIAGNOSIS — J449 Chronic obstructive pulmonary disease, unspecified: Secondary | ICD-10-CM | POA: Diagnosis not present

## 2022-10-22 DIAGNOSIS — E785 Hyperlipidemia, unspecified: Secondary | ICD-10-CM | POA: Diagnosis not present

## 2022-10-29 DIAGNOSIS — M25512 Pain in left shoulder: Secondary | ICD-10-CM | POA: Diagnosis not present

## 2022-10-29 DIAGNOSIS — M25511 Pain in right shoulder: Secondary | ICD-10-CM | POA: Diagnosis not present

## 2022-10-29 DIAGNOSIS — G20A1 Parkinson's disease without dyskinesia, without mention of fluctuations: Secondary | ICD-10-CM | POA: Diagnosis not present

## 2022-10-29 DIAGNOSIS — I5032 Chronic diastolic (congestive) heart failure: Secondary | ICD-10-CM | POA: Diagnosis not present

## 2022-10-29 DIAGNOSIS — I110 Hypertensive heart disease with heart failure: Secondary | ICD-10-CM | POA: Diagnosis not present

## 2022-10-29 DIAGNOSIS — M48061 Spinal stenosis, lumbar region without neurogenic claudication: Secondary | ICD-10-CM | POA: Diagnosis not present

## 2022-10-30 DIAGNOSIS — G20A1 Parkinson's disease without dyskinesia, without mention of fluctuations: Secondary | ICD-10-CM | POA: Diagnosis not present

## 2022-10-30 DIAGNOSIS — I110 Hypertensive heart disease with heart failure: Secondary | ICD-10-CM | POA: Diagnosis not present

## 2022-10-30 DIAGNOSIS — M25512 Pain in left shoulder: Secondary | ICD-10-CM | POA: Diagnosis not present

## 2022-10-30 DIAGNOSIS — M48061 Spinal stenosis, lumbar region without neurogenic claudication: Secondary | ICD-10-CM | POA: Diagnosis not present

## 2022-10-30 DIAGNOSIS — I5032 Chronic diastolic (congestive) heart failure: Secondary | ICD-10-CM | POA: Diagnosis not present

## 2022-10-30 DIAGNOSIS — M25511 Pain in right shoulder: Secondary | ICD-10-CM | POA: Diagnosis not present

## 2022-11-01 ENCOUNTER — Telehealth: Payer: Self-pay | Admitting: Family Medicine

## 2022-11-01 NOTE — Telephone Encounter (Signed)
.  Home Health Certification or Plan of Care Tracking  Is this a Certification or Plan of Care? Yes  HH Agency: [facility]  Order Number:  [id number]  Has charge sheet been attached? Yes   Where has form been placed:  In provider's box  Faxed to:   [phone]

## 2022-11-01 NOTE — Telephone Encounter (Signed)
Paper work has been received and placed in Hunter's box for review and signature.  

## 2022-11-04 DIAGNOSIS — G20A1 Parkinson's disease without dyskinesia, without mention of fluctuations: Secondary | ICD-10-CM | POA: Diagnosis not present

## 2022-11-04 DIAGNOSIS — M48061 Spinal stenosis, lumbar region without neurogenic claudication: Secondary | ICD-10-CM | POA: Diagnosis not present

## 2022-11-04 DIAGNOSIS — I110 Hypertensive heart disease with heart failure: Secondary | ICD-10-CM | POA: Diagnosis not present

## 2022-11-04 DIAGNOSIS — I5032 Chronic diastolic (congestive) heart failure: Secondary | ICD-10-CM | POA: Diagnosis not present

## 2022-11-04 DIAGNOSIS — M25511 Pain in right shoulder: Secondary | ICD-10-CM | POA: Diagnosis not present

## 2022-11-04 DIAGNOSIS — M25512 Pain in left shoulder: Secondary | ICD-10-CM | POA: Diagnosis not present

## 2022-11-06 DIAGNOSIS — M25512 Pain in left shoulder: Secondary | ICD-10-CM | POA: Diagnosis not present

## 2022-11-06 DIAGNOSIS — G20A1 Parkinson's disease without dyskinesia, without mention of fluctuations: Secondary | ICD-10-CM | POA: Diagnosis not present

## 2022-11-06 DIAGNOSIS — M48061 Spinal stenosis, lumbar region without neurogenic claudication: Secondary | ICD-10-CM | POA: Diagnosis not present

## 2022-11-06 DIAGNOSIS — I110 Hypertensive heart disease with heart failure: Secondary | ICD-10-CM | POA: Diagnosis not present

## 2022-11-06 DIAGNOSIS — I5032 Chronic diastolic (congestive) heart failure: Secondary | ICD-10-CM | POA: Diagnosis not present

## 2022-11-06 DIAGNOSIS — M25511 Pain in right shoulder: Secondary | ICD-10-CM | POA: Diagnosis not present

## 2022-11-08 ENCOUNTER — Other Ambulatory Visit: Payer: Self-pay | Admitting: Family Medicine

## 2022-11-11 DIAGNOSIS — M48061 Spinal stenosis, lumbar region without neurogenic claudication: Secondary | ICD-10-CM | POA: Diagnosis not present

## 2022-11-11 DIAGNOSIS — M25511 Pain in right shoulder: Secondary | ICD-10-CM | POA: Diagnosis not present

## 2022-11-11 DIAGNOSIS — M25512 Pain in left shoulder: Secondary | ICD-10-CM | POA: Diagnosis not present

## 2022-11-11 DIAGNOSIS — I110 Hypertensive heart disease with heart failure: Secondary | ICD-10-CM | POA: Diagnosis not present

## 2022-11-11 DIAGNOSIS — G20A1 Parkinson's disease without dyskinesia, without mention of fluctuations: Secondary | ICD-10-CM | POA: Diagnosis not present

## 2022-11-11 DIAGNOSIS — I5032 Chronic diastolic (congestive) heart failure: Secondary | ICD-10-CM | POA: Diagnosis not present

## 2022-11-12 ENCOUNTER — Encounter (INDEPENDENT_AMBULATORY_CARE_PROVIDER_SITE_OTHER): Payer: Medicare Other | Admitting: Ophthalmology

## 2022-11-12 ENCOUNTER — Ambulatory Visit: Payer: Medicare Other | Admitting: Family Medicine

## 2022-11-12 DIAGNOSIS — H35033 Hypertensive retinopathy, bilateral: Secondary | ICD-10-CM

## 2022-11-12 DIAGNOSIS — H43813 Vitreous degeneration, bilateral: Secondary | ICD-10-CM | POA: Diagnosis not present

## 2022-11-12 DIAGNOSIS — H353221 Exudative age-related macular degeneration, left eye, with active choroidal neovascularization: Secondary | ICD-10-CM | POA: Diagnosis not present

## 2022-11-12 DIAGNOSIS — H353112 Nonexudative age-related macular degeneration, right eye, intermediate dry stage: Secondary | ICD-10-CM | POA: Diagnosis not present

## 2022-11-12 DIAGNOSIS — I10 Essential (primary) hypertension: Secondary | ICD-10-CM

## 2022-11-13 ENCOUNTER — Ambulatory Visit: Payer: Medicare Other | Admitting: Family

## 2022-11-13 ENCOUNTER — Ambulatory Visit (INDEPENDENT_AMBULATORY_CARE_PROVIDER_SITE_OTHER): Payer: Medicare Other | Admitting: Physician Assistant

## 2022-11-13 ENCOUNTER — Encounter: Payer: Self-pay | Admitting: Physician Assistant

## 2022-11-13 ENCOUNTER — Encounter (INDEPENDENT_AMBULATORY_CARE_PROVIDER_SITE_OTHER): Payer: Medicare Other | Admitting: Ophthalmology

## 2022-11-13 VITALS — BP 136/84 | HR 85 | Temp 97.7°F | Ht 66.0 in | Wt 150.0 lb

## 2022-11-13 DIAGNOSIS — R300 Dysuria: Secondary | ICD-10-CM | POA: Diagnosis not present

## 2022-11-13 DIAGNOSIS — R296 Repeated falls: Secondary | ICD-10-CM

## 2022-11-13 LAB — CBC WITH DIFFERENTIAL/PLATELET
Basophils Absolute: 0.1 10*3/uL (ref 0.0–0.1)
Basophils Relative: 0.8 % (ref 0.0–3.0)
Eosinophils Absolute: 0.3 10*3/uL (ref 0.0–0.7)
Eosinophils Relative: 4.7 % (ref 0.0–5.0)
HCT: 34.7 % — ABNORMAL LOW (ref 36.0–46.0)
Hemoglobin: 11.5 g/dL — ABNORMAL LOW (ref 12.0–15.0)
Lymphocytes Relative: 28.9 % (ref 12.0–46.0)
Lymphs Abs: 1.9 10*3/uL (ref 0.7–4.0)
MCHC: 33.1 g/dL (ref 30.0–36.0)
MCV: 90.3 fl (ref 78.0–100.0)
Monocytes Absolute: 0.7 10*3/uL (ref 0.1–1.0)
Monocytes Relative: 11 % (ref 3.0–12.0)
Neutro Abs: 3.7 10*3/uL (ref 1.4–7.7)
Neutrophils Relative %: 54.6 % (ref 43.0–77.0)
Platelets: 253 10*3/uL (ref 150.0–400.0)
RBC: 3.85 Mil/uL — ABNORMAL LOW (ref 3.87–5.11)
RDW: 14.1 % (ref 11.5–15.5)
WBC: 6.7 10*3/uL (ref 4.0–10.5)

## 2022-11-13 LAB — COMPREHENSIVE METABOLIC PANEL
ALT: 16 U/L (ref 0–35)
AST: 20 U/L (ref 0–37)
Albumin: 3.9 g/dL (ref 3.5–5.2)
Alkaline Phosphatase: 107 U/L (ref 39–117)
BUN: 27 mg/dL — ABNORMAL HIGH (ref 6–23)
CO2: 27 mEq/L (ref 19–32)
Calcium: 9 mg/dL (ref 8.4–10.5)
Chloride: 102 mEq/L (ref 96–112)
Creatinine, Ser: 1.08 mg/dL (ref 0.40–1.20)
GFR: 45.63 mL/min — ABNORMAL LOW (ref >60.00)
Glucose, Bld: 102 mg/dL — ABNORMAL HIGH (ref 70–99)
Potassium: 3.9 mEq/L (ref 3.5–5.1)
Sodium: 141 mEq/L (ref 135–145)
Total Bilirubin: 0.6 mg/dL (ref 0.2–1.2)
Total Protein: 7 g/dL (ref 6.0–8.3)

## 2022-11-13 LAB — POCT URINALYSIS DIPSTICK
Bilirubin, UA: NEGATIVE
Blood, UA: POSITIVE
Glucose, UA: NEGATIVE
Ketones, UA: NEGATIVE
Nitrite, UA: NEGATIVE
Protein, UA: POSITIVE — AB
Spec Grav, UA: 1.015 (ref 1.010–1.025)
Urobilinogen, UA: 0.2 E.U./dL
pH, UA: 6 (ref 5.0–8.0)

## 2022-11-13 LAB — VITAMIN B12: Vitamin B-12: >1500 pg/mL — ABNORMAL HIGH (ref 211–911)

## 2022-11-13 MED ORDER — CEPHALEXIN 500 MG PO CAPS: 500.0000 mg | ORAL_CAPSULE | Freq: Two times a day (BID) | ORAL | 0 refills | Status: DC

## 2022-11-13 NOTE — Progress Notes (Signed)
Cheryl Atkinson is a 88 y.o. female here for a new problem.  History of Present Illness:   Chief Complaint  Patient presents with   Dysuria    Pt c/o burning with urination and frequency x 1 month.   Patient is present with her caregiver and her son  HPI  Dysuria Onset x 2 weeks. Caregiver reports pt has accompanying dark colored urine. Accompanying urinary frequency. Patient reports accompanying BLE weakness and notes that her legs give out when she stands, worsened in the last x1 week. She had 1 fall but denies any injuries.  Chronically uses a walker but typically able to function much better with it than she is now. She reports similar symptoms with prior UTIs. However, she previously had headaches and abdominal discomfort with UTIs. Last UTI was x3 years ago and severe. States that she drinks 1 glass of cranberry juice and never finishes all of her large cup of water throughout the day. This is a chronic issue.  Reports accompanying constipation for which she uses Miralax. She has chronic shortness of breath which is relieved with inhalers.  Denies any abdominal discomfort or flank pain. Denies any leg swelling or chest pain.  Denies any fever, chills, URI symptoms, cough, and decreased appetite.  Past Medical History:  Diagnosis Date   Acute cholecystitis 12/29/2019   Anemia    Arthritis    "shoulders" (05/04/2015)   Cellulitis of right lower extremity 11/27/2013   CHF (congestive heart failure) (HCC)    Chronic lower back pain    Constipation    COPD (chronic obstructive pulmonary disease) (HCC)    GERD (gastroesophageal reflux disease)    History of hiatal hernia    HLD (hyperlipidemia)    Hypertension    Insomnia    Osteoporosis    Peripheral arterial disease (HCC)    Restless leg syndrome    Thyroid disease      Social History   Tobacco Use   Smoking status: Former    Packs/day: 0.50    Years: 60.00    Total pack years: 30.00    Types: Cigarettes     Quit date: 04/02/2015    Years since quitting: 7.6   Smokeless tobacco: Never  Vaping Use   Vaping Use: Never used  Substance Use Topics   Alcohol use: No   Drug use: No    Past Surgical History:  Procedure Laterality Date   ABDOMINAL AORTAGRAM  05/04/2015   Procedure: Abdominal Aortagram;  Surgeon: Lorretta Harp, MD;  Location: [facility];  Service: Cardiovascular;;   APPENDECTOMY     CATARACT EXTRACTION W/ INTRAOCULAR LENS  IMPLANT, BILATERAL Bilateral    CHOLECYSTECTOMY N/A 04/04/2020   Procedure: LAPAROSCOPIC CHOLECYSTECTOMY;  Surgeon: Georganna Skeans, MD;  Location: [facility];  Service: General;  Laterality: N/A;   I & D EXTREMITY Right 12/22/2016   Procedure: IRRIGATION AND DEBRIDEMENT EXTREMITY;  Surgeon: Milly Jakob, MD;  Location: [facility];  Service: Orthopedics;  Laterality: Right;   IR EXCHANGE BILIARY DRAIN  03/13/2020   IR PATIENT EVAL TECH 0-60 MINS  12/30/2019   IR PERC CHOLECYSTOSTOMY  01/27/2020   IR RADIOLOGIST EVAL & MGMT  05/16/2020   PERIPHERAL VASCULAR CATHETERIZATION N/A 05/04/2015   Procedure: Lower Extremity Angiography;  Surgeon: Lorretta Harp, MD;  Location: [facility];  Service: Cardiovascular;  Laterality: N/A;   PERIPHERAL VASCULAR CATHETERIZATION  05/04/2015   Procedure: Peripheral Vascular Intervention;  Surgeon: Lorretta Harp, MD;  Location: [facility]  [facility];  Service: Cardiovascular;;  RCIA - 7x22 ICAST   PERIPHERAL VASCULAR CATHETERIZATION Right 09/04/2015   Procedure: Peripheral Vascular Atherectomy;  Surgeon: Runell Gess, MD;  Location: [facility];  Service: Cardiovascular;  Laterality: Right;  SFA   sfa Right 09/04/2015   de balloon   THYROID SURGERY Right ?2013   "had goiter taken off my neck"   TONSILLECTOMY     VAGINAL HYSTERECTOMY      Family History  Problem Relation Age of Onset   Heart disease Mother    Heart disease Father    Heart attack Father    Cancer Sister        Breast Cancer   Atrial fibrillation  Sister    Stroke Maternal Grandmother    Cancer Paternal Grandmother    Coronary artery disease Other     Allergies  Allergen Reactions   Codeine     hallucinations    Current Medications:   Current Outpatient Medications:    Albuterol Sulfate (PROAIR RESPICLICK) 108 (90 Base) MCG/ACT AEPB, Inhale 2 puffs into the lungs every 6 (six) hours as needed (shortness of breath from COPD)., Disp: 1 each, Rfl: 5   CALCIUM PO, Take by mouth., Disp: , Rfl:    carbidopa-levodopa (SINEMET IR) 25-100 MG tablet, TAKE 1 TABLET BY MOUTH DAILY AS NEEDED FOR WORSENING RLS, Disp: 90 tablet, Rfl: 1   cephALEXin (KEFLEX) 500 MG capsule, Take 1 capsule (500 mg total) by mouth 2 (two) times daily., Disp: 14 capsule, Rfl: 0   Cholecalciferol (VITAMIN D3 PO), Take by mouth., Disp: , Rfl:    ferrous sulfate 325 (65 FE) MG tablet, Take 325 mg by mouth 2 (two) times a week. Patient takes one tablet on Monday and Friday, Disp: , Rfl:    furosemide (LASIX) 40 MG tablet, TAKE 1 TABLET TWICE A DAY, Disp: 180 tablet, Rfl: 3   irbesartan (AVAPRO) 300 MG tablet, Take 1 tablet (300 mg total) by mouth daily., Disp: 14 tablet, Rfl: 0   LINZESS 145 MCG CAPS capsule, Take 145 mcg by mouth daily as needed (constipation)., Disp: , Rfl:    mirtazapine (REMERON) 7.5 MG tablet, Take 1 tablet (7.5 mg total) by mouth at bedtime., Disp: 30 tablet, Rfl: 5   Multiple Vitamins-Minerals (PRESERVISION AREDS 2 PO), Take 1 tablet by mouth 2 (two) times daily., Disp: , Rfl:    polyethylene glycol (MIRALAX / GLYCOLAX) 17 g packet, Take 17 g by mouth 2 (two) times daily. Reported on 03/14/2016 (Patient taking differently: Take 17 g by mouth daily as needed for mild constipation.), Disp: , Rfl:    potassium chloride SA (KLOR-CON) 10 MEQ tablet, Take 1 tablet (10 mEq total) by mouth 2 (two) times daily as needed. Needs to be taken  With each dose of lasix, Disp: , Rfl:    rosuvastatin (CRESTOR) 40 MG tablet, TAKE 1 TABLET DAILY, Disp: 90 tablet,  Rfl: 3   rotigotine (NEUPRO) 2 MG/24HR, Place 1 patch onto the skin daily., Disp: 30 patch, Rfl: 12   vitamin B-12 (CYANOCOBALAMIN) 1000 MCG tablet, Take 1,000 mcg by mouth daily., Disp: , Rfl:    amLODipine (NORVASC) 5 MG tablet, Take 1 tablet (5 mg total) by mouth daily., Disp: 30 tablet, Rfl: 5   Review of Systems:   Review of Systems  Constitutional:  Negative for chills and fever.  HENT:  Negative for congestion and sore throat.   Respiratory:  Positive for shortness of breath (chronic). Negative for  cough.   Cardiovascular:  Negative for chest pain and leg swelling.  Gastrointestinal:  Positive for constipation. Negative for abdominal pain.  Genitourinary:  Positive for dysuria and frequency. Negative for flank pain.  Musculoskeletal:  Positive for falls (x1). Negative for back pain, joint pain and myalgias.  Neurological:  Positive for weakness (BLE). Negative for loss of consciousness and headaches.    Vitals:   Vitals:   11/13/22 0828  BP: 136/84  Pulse: 85  Temp: 97.7 F (36.5 C)  TempSrc: Temporal  SpO2: 94%  Weight: 150 lb (68 kg)  Height: 5\' 6"  (1.676 m)     Body mass index is 24.21 kg/m.  Physical Exam:   Physical Exam Constitutional:      Appearance: Normal appearance. She is well-developed.  HENT:     Head: Normocephalic and atraumatic.  Eyes:     General: Lids are normal.     Extraocular Movements: Extraocular movements intact.     Conjunctiva/sclera: Conjunctivae normal.  Pulmonary:     Effort: Pulmonary effort is normal.  Abdominal:     Tenderness: There is no right CVA tenderness, left CVA tenderness, guarding or rebound.  Musculoskeletal:        General: Normal range of motion.     Cervical back: Normal range of motion and neck supple.  Skin:    General: Skin is warm and dry.  Neurological:     Mental Status: She is alert and oriented to person, place, and time.  Psychiatric:        Attention and Perception: Attention and perception  normal.        Mood and Affect: Mood normal.        Behavior: Behavior normal.        Thought Content: Thought content normal.        Judgment: Judgment normal.    Results for orders placed or performed in visit on 11/13/22  POCT urinalysis dipstick  Result Value Ref Range   Color, UA yellow    Clarity, UA cloudy    Glucose, UA Negative Negative   Bilirubin, UA negative    Ketones, UA negative    Spec Grav, UA 1.015 1.010 - 1.025   Blood, UA positive    pH, UA 6.0 5.0 - 8.0   Protein, UA Positive (A) Negative   Urobilinogen, UA 0.2 0.2 or 1.0 E.U./dL   Nitrite, UA negative    Leukocytes, UA Large (3+) (A) Negative   Appearance     Odor      Assessment and Plan:   Dysuria Urinalysis and symptoms are concerning for possible acute cystitis Start Keflex 500 mg twice daily Urine culture sent out and will adjust treatment as indicated Also update blood work today I recommend that she follow-up with Dr. Yong Channel in 2 to 4 weeks to review the symptoms, sooner if concerns  Recurrent falls while walking Unclear etiology We will update blood work She is in no pain today, I do not feel like any x-rays are warranted at this time Recommend pushing fluids and starting treatment for acute cystitis with close follow-up with Dr. Yong Channel in 2 to 4 weeks to review symptoms, sooner if concerns   I,Alexis Herring,acting as a scribe for Inda Coke, PA.,have documented all relevant documentation on the behalf of Inda Coke, PA,as directed by  Inda Coke, PA while in the presence of Inda Coke, [place].  I, Inda Coke, [place], have reviewed all documentation for this visit. The documentation on 11/13/22 for the  exam, diagnosis, procedures, and orders are all accurate and complete.   Jarold Motto, PA-C

## 2022-11-13 NOTE — Patient Instructions (Addendum)
It was great to see you!  Please bring a urine sample at your convenience.  After you collect the sample, I would like for you to go ahead and start the keflex antibiotic.   We will also check your blood work today  Push fluids  Please plan to follow-up in 2-4 weeks with Dr Durene Cal, sooner if concerns.  Take care,  Jarold Motto PA-C

## 2022-11-14 DIAGNOSIS — G20A1 Parkinson's disease without dyskinesia, without mention of fluctuations: Secondary | ICD-10-CM | POA: Diagnosis not present

## 2022-11-14 DIAGNOSIS — M48061 Spinal stenosis, lumbar region without neurogenic claudication: Secondary | ICD-10-CM | POA: Diagnosis not present

## 2022-11-14 DIAGNOSIS — I5032 Chronic diastolic (congestive) heart failure: Secondary | ICD-10-CM | POA: Diagnosis not present

## 2022-11-14 DIAGNOSIS — I110 Hypertensive heart disease with heart failure: Secondary | ICD-10-CM | POA: Diagnosis not present

## 2022-11-14 DIAGNOSIS — M25511 Pain in right shoulder: Secondary | ICD-10-CM | POA: Diagnosis not present

## 2022-11-14 DIAGNOSIS — M25512 Pain in left shoulder: Secondary | ICD-10-CM | POA: Diagnosis not present

## 2022-11-15 DIAGNOSIS — M48061 Spinal stenosis, lumbar region without neurogenic claudication: Secondary | ICD-10-CM | POA: Diagnosis not present

## 2022-11-15 DIAGNOSIS — I110 Hypertensive heart disease with heart failure: Secondary | ICD-10-CM | POA: Diagnosis not present

## 2022-11-15 DIAGNOSIS — M25512 Pain in left shoulder: Secondary | ICD-10-CM | POA: Diagnosis not present

## 2022-11-15 DIAGNOSIS — I5032 Chronic diastolic (congestive) heart failure: Secondary | ICD-10-CM | POA: Diagnosis not present

## 2022-11-15 DIAGNOSIS — M25511 Pain in right shoulder: Secondary | ICD-10-CM | POA: Diagnosis not present

## 2022-11-15 DIAGNOSIS — G20A1 Parkinson's disease without dyskinesia, without mention of fluctuations: Secondary | ICD-10-CM | POA: Diagnosis not present

## 2022-11-17 LAB — URINE CULTURE
MICRO NUMBER:: 14310013
SPECIMEN QUALITY:: ADEQUATE

## 2022-11-18 ENCOUNTER — Telehealth: Payer: Self-pay | Admitting: Family Medicine

## 2022-11-18 DIAGNOSIS — M25511 Pain in right shoulder: Secondary | ICD-10-CM | POA: Diagnosis not present

## 2022-11-18 DIAGNOSIS — I110 Hypertensive heart disease with heart failure: Secondary | ICD-10-CM | POA: Diagnosis not present

## 2022-11-18 DIAGNOSIS — I5032 Chronic diastolic (congestive) heart failure: Secondary | ICD-10-CM | POA: Diagnosis not present

## 2022-11-18 DIAGNOSIS — M48061 Spinal stenosis, lumbar region without neurogenic claudication: Secondary | ICD-10-CM | POA: Diagnosis not present

## 2022-11-18 DIAGNOSIS — G20A1 Parkinson's disease without dyskinesia, without mention of fluctuations: Secondary | ICD-10-CM | POA: Diagnosis not present

## 2022-11-18 DIAGNOSIS — M25512 Pain in left shoulder: Secondary | ICD-10-CM | POA: Diagnosis not present

## 2022-11-18 NOTE — Telephone Encounter (Signed)
Caller states: -Patient informed her that she was experiencing a runny nose currently  - Although pt has an OV with PCP on 12/21, she wants to know if PCP has any recommendations on what pt can take   Please advise.

## 2022-11-18 NOTE — Telephone Encounter (Signed)
Could try some flonase over the counter if no history of nosebleeds- may need to try for several days or weeks to be effective

## 2022-11-18 NOTE — Telephone Encounter (Signed)
See below

## 2022-11-19 NOTE — Telephone Encounter (Signed)
Relayed Hunter's msg to pt's sister. She verbalized understanding.

## 2022-11-20 DIAGNOSIS — M25511 Pain in right shoulder: Secondary | ICD-10-CM | POA: Diagnosis not present

## 2022-11-20 DIAGNOSIS — M48061 Spinal stenosis, lumbar region without neurogenic claudication: Secondary | ICD-10-CM | POA: Diagnosis not present

## 2022-11-20 DIAGNOSIS — I110 Hypertensive heart disease with heart failure: Secondary | ICD-10-CM | POA: Diagnosis not present

## 2022-11-20 DIAGNOSIS — M25512 Pain in left shoulder: Secondary | ICD-10-CM | POA: Diagnosis not present

## 2022-11-20 DIAGNOSIS — I5032 Chronic diastolic (congestive) heart failure: Secondary | ICD-10-CM | POA: Diagnosis not present

## 2022-11-20 DIAGNOSIS — G20A1 Parkinson's disease without dyskinesia, without mention of fluctuations: Secondary | ICD-10-CM | POA: Diagnosis not present

## 2022-11-21 ENCOUNTER — Encounter: Payer: Self-pay | Admitting: Family Medicine

## 2022-11-21 ENCOUNTER — Ambulatory Visit (INDEPENDENT_AMBULATORY_CARE_PROVIDER_SITE_OTHER): Payer: Medicare Other | Admitting: Family Medicine

## 2022-11-21 VITALS — BP 120/80 | HR 90 | Temp 97.7°F

## 2022-11-21 DIAGNOSIS — F329 Major depressive disorder, single episode, unspecified: Secondary | ICD-10-CM | POA: Diagnosis not present

## 2022-11-21 DIAGNOSIS — G2581 Restless legs syndrome: Secondary | ICD-10-CM | POA: Diagnosis not present

## 2022-11-21 DIAGNOSIS — Z8616 Personal history of COVID-19: Secondary | ICD-10-CM | POA: Diagnosis not present

## 2022-11-21 DIAGNOSIS — R0981 Nasal congestion: Secondary | ICD-10-CM | POA: Diagnosis not present

## 2022-11-21 DIAGNOSIS — M810 Age-related osteoporosis without current pathological fracture: Secondary | ICD-10-CM | POA: Diagnosis not present

## 2022-11-21 DIAGNOSIS — I10 Essential (primary) hypertension: Secondary | ICD-10-CM | POA: Diagnosis not present

## 2022-11-21 DIAGNOSIS — I77811 Abdominal aortic ectasia: Secondary | ICD-10-CM | POA: Diagnosis not present

## 2022-11-21 DIAGNOSIS — Z87440 Personal history of urinary (tract) infections: Secondary | ICD-10-CM | POA: Diagnosis not present

## 2022-11-21 DIAGNOSIS — I110 Hypertensive heart disease with heart failure: Secondary | ICD-10-CM | POA: Diagnosis not present

## 2022-11-21 DIAGNOSIS — M25511 Pain in right shoulder: Secondary | ICD-10-CM | POA: Diagnosis not present

## 2022-11-21 DIAGNOSIS — J449 Chronic obstructive pulmonary disease, unspecified: Secondary | ICD-10-CM | POA: Diagnosis not present

## 2022-11-21 DIAGNOSIS — Z7901 Long term (current) use of anticoagulants: Secondary | ICD-10-CM | POA: Diagnosis not present

## 2022-11-21 DIAGNOSIS — Z87891 Personal history of nicotine dependence: Secondary | ICD-10-CM | POA: Diagnosis not present

## 2022-11-21 DIAGNOSIS — M25512 Pain in left shoulder: Secondary | ICD-10-CM | POA: Diagnosis not present

## 2022-11-21 DIAGNOSIS — D509 Iron deficiency anemia, unspecified: Secondary | ICD-10-CM | POA: Diagnosis not present

## 2022-11-21 DIAGNOSIS — I701 Atherosclerosis of renal artery: Secondary | ICD-10-CM | POA: Diagnosis not present

## 2022-11-21 DIAGNOSIS — E785 Hyperlipidemia, unspecified: Secondary | ICD-10-CM | POA: Diagnosis not present

## 2022-11-21 DIAGNOSIS — I5032 Chronic diastolic (congestive) heart failure: Secondary | ICD-10-CM | POA: Diagnosis not present

## 2022-11-21 DIAGNOSIS — H8110 Benign paroxysmal vertigo, unspecified ear: Secondary | ICD-10-CM | POA: Diagnosis not present

## 2022-11-21 DIAGNOSIS — I4891 Unspecified atrial fibrillation: Secondary | ICD-10-CM | POA: Diagnosis not present

## 2022-11-21 DIAGNOSIS — M48061 Spinal stenosis, lumbar region without neurogenic claudication: Secondary | ICD-10-CM | POA: Diagnosis not present

## 2022-11-21 DIAGNOSIS — G4700 Insomnia, unspecified: Secondary | ICD-10-CM | POA: Diagnosis not present

## 2022-11-21 DIAGNOSIS — Z9049 Acquired absence of other specified parts of digestive tract: Secondary | ICD-10-CM | POA: Diagnosis not present

## 2022-11-21 DIAGNOSIS — K219 Gastro-esophageal reflux disease without esophagitis: Secondary | ICD-10-CM | POA: Diagnosis not present

## 2022-11-21 DIAGNOSIS — E042 Nontoxic multinodular goiter: Secondary | ICD-10-CM | POA: Diagnosis not present

## 2022-11-21 DIAGNOSIS — G20A1 Parkinson's disease without dyskinesia, without mention of fluctuations: Secondary | ICD-10-CM | POA: Diagnosis not present

## 2022-11-21 DIAGNOSIS — I739 Peripheral vascular disease, unspecified: Secondary | ICD-10-CM | POA: Diagnosis not present

## 2022-11-21 LAB — POC COVID19 BINAXNOW: SARS Coronavirus 2 Ag: NEGATIVE
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
## 2022-11-21 NOTE — Patient Instructions (Addendum)
Health Maintenance Due  Topic Date Due   Medicare Annual Wellness (AWV)  07/20/2022  You are eligible to schedule your annual wellness visit with our nurse specialist Inetta Fermo.  Please consider scheduling this before you leave today   likely viral sinusitis- we discussed some conservative measures. If symptoms last over another week or other new or worsening symptoms or improves significantly then worsens we need to know about that.  -recommend repeat covid test in 2 days but negative today -does have copd but no increased wheezing or shortness of breath- hold off on steroids for now- if that develops we may need steroids  Recommended follow up: Return in about 2 months (around 01/22/2023) for followup or sooner if needed.Schedule b4 you leave.

## 2022-11-26 ENCOUNTER — Telehealth: Payer: Self-pay | Admitting: Family Medicine

## 2022-11-26 NOTE — Telephone Encounter (Signed)
.  Home Health Certification or Plan of Care Tracking  Is this a Certification or Plan of Care? yes  [facility] Agency: [facility]  Order Number:  [id number]  Has charge sheet been attached? yes  Where has form been placed:  In provider's box   Faxed to:   [phone]

## 2022-11-26 NOTE — Telephone Encounter (Signed)
Noted  

## 2022-12-03 DIAGNOSIS — M25512 Pain in left shoulder: Secondary | ICD-10-CM | POA: Diagnosis not present

## 2022-12-03 DIAGNOSIS — G20A1 Parkinson's disease without dyskinesia, without mention of fluctuations: Secondary | ICD-10-CM | POA: Diagnosis not present

## 2022-12-03 DIAGNOSIS — I5032 Chronic diastolic (congestive) heart failure: Secondary | ICD-10-CM | POA: Diagnosis not present

## 2022-12-03 DIAGNOSIS — I110 Hypertensive heart disease with heart failure: Secondary | ICD-10-CM | POA: Diagnosis not present

## 2022-12-03 DIAGNOSIS — M25511 Pain in right shoulder: Secondary | ICD-10-CM | POA: Diagnosis not present

## 2022-12-03 DIAGNOSIS — M48061 Spinal stenosis, lumbar region without neurogenic claudication: Secondary | ICD-10-CM | POA: Diagnosis not present

## 2022-12-10 DIAGNOSIS — I110 Hypertensive heart disease with heart failure: Secondary | ICD-10-CM | POA: Diagnosis not present

## 2022-12-10 DIAGNOSIS — I5032 Chronic diastolic (congestive) heart failure: Secondary | ICD-10-CM | POA: Diagnosis not present

## 2022-12-10 DIAGNOSIS — M25511 Pain in right shoulder: Secondary | ICD-10-CM | POA: Diagnosis not present

## 2022-12-10 DIAGNOSIS — G20A1 Parkinson's disease without dyskinesia, without mention of fluctuations: Secondary | ICD-10-CM | POA: Diagnosis not present

## 2022-12-10 DIAGNOSIS — M48061 Spinal stenosis, lumbar region without neurogenic claudication: Secondary | ICD-10-CM | POA: Diagnosis not present

## 2022-12-10 DIAGNOSIS — M25512 Pain in left shoulder: Secondary | ICD-10-CM | POA: Diagnosis not present

## 2022-12-11 ENCOUNTER — Encounter (INDEPENDENT_AMBULATORY_CARE_PROVIDER_SITE_OTHER): Payer: Medicare Other | Admitting: Ophthalmology

## 2022-12-11 DIAGNOSIS — H353112 Nonexudative age-related macular degeneration, right eye, intermediate dry stage: Secondary | ICD-10-CM | POA: Diagnosis not present

## 2022-12-11 DIAGNOSIS — I10 Essential (primary) hypertension: Secondary | ICD-10-CM | POA: Diagnosis not present

## 2022-12-11 DIAGNOSIS — H43813 Vitreous degeneration, bilateral: Secondary | ICD-10-CM

## 2022-12-11 DIAGNOSIS — H35033 Hypertensive retinopathy, bilateral: Secondary | ICD-10-CM

## 2022-12-11 DIAGNOSIS — H353221 Exudative age-related macular degeneration, left eye, with active choroidal neovascularization: Secondary | ICD-10-CM | POA: Diagnosis not present

## 2022-12-12 ENCOUNTER — Telehealth: Payer: Self-pay | Admitting: Family Medicine

## 2022-12-12 DIAGNOSIS — I5032 Chronic diastolic (congestive) heart failure: Secondary | ICD-10-CM | POA: Diagnosis not present

## 2022-12-12 DIAGNOSIS — G20A1 Parkinson's disease without dyskinesia, without mention of fluctuations: Secondary | ICD-10-CM | POA: Diagnosis not present

## 2022-12-12 DIAGNOSIS — M48061 Spinal stenosis, lumbar region without neurogenic claudication: Secondary | ICD-10-CM | POA: Diagnosis not present

## 2022-12-12 DIAGNOSIS — M25512 Pain in left shoulder: Secondary | ICD-10-CM | POA: Diagnosis not present

## 2022-12-12 DIAGNOSIS — M25511 Pain in right shoulder: Secondary | ICD-10-CM | POA: Diagnosis not present

## 2022-12-12 DIAGNOSIS — I110 Hypertensive heart disease with heart failure: Secondary | ICD-10-CM | POA: Diagnosis not present

## 2022-12-12 MED ORDER — AMOXICILLIN-POT CLAVULANATE 875-125 MG PO TABS: 1.0000 | ORAL_TABLET | Freq: Two times a day (BID) | ORAL | 0 refills | Status: AC

## 2022-12-12 NOTE — Telephone Encounter (Signed)
Caller states: - Following OV with PCP on 11/22/23, patient's head cold seems to still be present and worsening  - Weakness has worsened to the point that she can barely stand  - Denies any other symptoms   Caller and patient have been transferred to triage.

## 2022-12-12 NOTE — Telephone Encounter (Signed)
Final Disposition: See PCP within 24 hours   Patient Name: Cheryl Atkinson Gender: Female DOB: 1931/05/09 Age: 89 Y 3 M 18 D Return Phone Number: [phone] (Primary) Address: City/ State/ Zip: [place]  [address] Client [facility] [place] [facility] [place] [facility] [place] [facility] [place] [facility] Provider Garret Reddish- MD Contact Type Call Who Is Calling Patient / Member / Family / Caregiver Call Type Triage / Clinical Caller Name Elwyn Lade Relationship To Patient [place] Return Phone Number [phone] (Primary) Chief Complaint Weakness, Generalized Reason for Call Symptomatic / Request for [place] states the pt has had a head cold for almost a month. The pt seen her PCP in December but since then her Sx has gotten worse. The pt is also very weak and feels congested. Translation No Nurse Assessment Nurse: [place], RN, Estill Bamberg Date/Time (Eastern Time): 12/12/2022 12:57:13 PM Confirm and document reason for call. If symptomatic, describe symptoms. ---caller states patient was seen in dec about the cold. seemed like it was getting better. but still coughing, weak and can hardly get to the bathroom now and has been like that since last friday. just getting worse. Does the patient have any new or worsening symptoms? ---Yes Will a triage be completed? ---Yes Related visit to physician within the last 2 weeks? ---No Does the PT have any chronic conditions? (i.e. diabetes, asthma, this includes High risk factors for pregnancy, etc.) ---Yes List chronic conditions. ---CHF Is this a behavioral health or substance abuse call? ---No Guidelines Guideline Title Affirmed Question Affirmed Notes Nurse Date/Time (Eastern Time) Cough - Acute Productive Cough has been present for > 3 weeks Hum[place]et, RN, Estill Bamberg 12/12/2022 12:59:25 PM PLEASE NOTE: All timestamps contained within this report are  represented as [place] Standard Time. CONFIDENTIALTY NOTICE: This fax transmission is intended only for the addressee. It contains information that is legally privileged, confidential or otherwise protected from use or disclosure. If you are not the intended recipient, you are strictly prohibited from reviewing, disclosing, copying using or disseminating any of this information or taking any action in reliance on or regarding this information. If you have received this fax in error, please notify us immediately by telephone so that we can arrange for its return to [place]. Phone: [phone], Toll-Free: [phone], Fax: [phone] Page: 2 of 2 Call Id: [id number] [place]. Time Eilene Ghazi Time) Disposition Final User 12/12/2022 1:08:06 PM See PCP within 24 Hours Yes Humfleet, RN, Estill Bamberg Final Disposition 12/12/2022 1:08:06 PM See PCP within 24 Hours Yes [place], RN, Estill Bamberg Disposition Overriden: SEE PCP WITHIN 3 DAYS Override Reason: Patient's symptoms need a higher level of care Caller Disagree/Comply Comply Caller Understands Yes PreDisposition Did not know what to do Care Advice Given Per Guideline SEE PCP WITHIN 24 HOURS: * IF OFFICE WILL BE OPEN: You need to be examined within the next 24 hours. Call your doctor (or NP/PA) when the office opens and make an appointment. CARE ADVICE given per Cough - Acute Productive (Adult) guideline. CALL BACK IF: * You become worse * Difficulty breathing occurs * You become worse CALL BACK IF: DRINK PLENTY OF LIQUIDS: Comments User: Rozelle Logan, RN Date/Time Eilene Ghazi Time): 12/12/2022 1:02:59 PM [place] nurse just visited and said vitals were good. listened to lungs and nurse told care giver she did not hear anything in her chest User: Rozelle Logan, RN Date/Time Eilene Ghazi Time): 12/12/2022 1:03:57 PM says is eating good and drinking good. User: Rozelle Logan, RN Date/Time (  Eastern Time): 12/12/2022 1:06:09 PM uses purewick/ had UTI in dec. cleared up.  urine is normal. not cloudy, no foul smell. no urgency Referrals REFERRED TO PCP OFFICE

## 2022-12-12 NOTE — Telephone Encounter (Signed)
Called and spoke with pt and gave her below message.  

## 2022-12-12 NOTE — Telephone Encounter (Signed)
At last visit mentioned sending in Augmentin if she fails to improve-since she has felt improve I have sent this in for her-recommend taking this with food.  Recommend follow-up visit if she fails to improve on medication or sooner if symptoms worsen

## 2022-12-12 NOTE — Telephone Encounter (Signed)
Willing to work pt in for reassessment?

## 2022-12-12 NOTE — Telephone Encounter (Signed)
She sees you tomorrow, she is aware that [facility] sent in antibiotic for her yesterday (FYI).

## 2022-12-13 ENCOUNTER — Ambulatory Visit (INDEPENDENT_AMBULATORY_CARE_PROVIDER_SITE_OTHER): Payer: Medicare Other | Admitting: Physician Assistant

## 2022-12-13 ENCOUNTER — Ambulatory Visit: Payer: Medicare Other | Admitting: Physician Assistant

## 2022-12-13 ENCOUNTER — Encounter: Payer: Self-pay | Admitting: Physician Assistant

## 2022-12-13 VITALS — BP 120/68 | HR 50 | Temp 97.7°F | Ht 66.0 in | Wt 149.0 lb

## 2022-12-13 DIAGNOSIS — R051 Acute cough: Secondary | ICD-10-CM

## 2022-12-13 DIAGNOSIS — N390 Urinary tract infection, site not specified: Secondary | ICD-10-CM

## 2022-12-13 DIAGNOSIS — M545 Low back pain, unspecified: Secondary | ICD-10-CM | POA: Diagnosis not present

## 2022-12-13 DIAGNOSIS — G8929 Other chronic pain: Secondary | ICD-10-CM | POA: Diagnosis not present

## 2022-12-13 LAB — URINALYSIS, ROUTINE W REFLEX MICROSCOPIC
Bilirubin Urine: NEGATIVE
Hgb urine dipstick: NEGATIVE
Ketones, ur: NEGATIVE
Leukocytes,Ua: NEGATIVE
Nitrite: NEGATIVE
Specific Gravity, Urine: 1.02 (ref 1.000–1.030)
Total Protein, Urine: 30 — AB
Urine Glucose: NEGATIVE
Urobilinogen, UA: 0.2 (ref 0.0–1.0)
pH: 6 (ref 5.0–8.0)

## 2022-12-13 NOTE — Progress Notes (Signed)
Cheryl Atkinson is a 89 y.o. female here for a follow up of a pre-existing problem.  History of Present Illness:   Chief Complaint  Patient presents with   Cough    Pt c/o non-productive cough and chest congestion. Denies fever or chills.    HPI  Cough Patient seen by Dr. Garret Reddish for similar symptoms on 11/21/22. Dr. Yong Channel prescribed her Augmentin yesterday which patient has started She continues to have a cough x2-3 weeks with chest congestion that has still not improved. Patient denies any fever or chills, SOB. SpO2 Readings from Last 3 Encounters:  12/13/22 96%  11/21/22 94%  11/13/22 94%     Weakness; Back pain Patient was seen by me on 11/13/22 for urinary symptoms and was started on Keflex for a UTI at that time. She complains of persistent generalized weakness and severe low back pain bilaterally which started yesterday. She has concerns that she still has a UTI. She denies any falls or skin breakdown. Patient states that she does not drink much water. She has been drinking ginger ale, Boost, and some cranberry juice. She does like flavored water, so her family plans to encourage her to drink more of this. Denies numbness/tingling to LE or new incontinence.  Past Medical History:  Diagnosis Date   Acute cholecystitis 12/29/2019   Anemia    Arthritis    "shoulders" (05/04/2015)   Cellulitis of right lower extremity 11/27/2013   CHF (congestive heart failure) (HCC)    Chronic lower back pain    Constipation    COPD (chronic obstructive pulmonary disease) (HCC)    GERD (gastroesophageal reflux disease)    History of hiatal hernia    HLD (hyperlipidemia)    Hypertension    Insomnia    Osteoporosis    Peripheral arterial disease (HCC)    Restless leg syndrome    Thyroid disease      Social History   Tobacco Use   Smoking status: Former    Packs/day: 0.50    Years: 60.00    Total pack years: 30.00    Types: Cigarettes    Quit date: 04/02/2015    Years  since quitting: 7.7   Smokeless tobacco: Never  Vaping Use   Vaping Use: Never used  Substance Use Topics   Alcohol use: No   Drug use: No    Past Surgical History:  Procedure Laterality Date   ABDOMINAL AORTAGRAM  05/04/2015   Procedure: Abdominal Aortagram;  Surgeon: Lorretta Harp, MD;  Location: [facility];  Service: Cardiovascular;;   APPENDECTOMY     CATARACT EXTRACTION W/ INTRAOCULAR LENS  IMPLANT, BILATERAL Bilateral    CHOLECYSTECTOMY N/A 04/04/2020   Procedure: LAPAROSCOPIC CHOLECYSTECTOMY;  Surgeon: Georganna Skeans, MD;  Location: [facility];  Service: General;  Laterality: N/A;   I & D EXTREMITY Right 12/22/2016   Procedure: IRRIGATION AND DEBRIDEMENT EXTREMITY;  Surgeon: Milly Jakob, MD;  Location: [facility];  Service: Orthopedics;  Laterality: Right;   IR EXCHANGE BILIARY DRAIN  03/13/2020   IR PATIENT EVAL TECH 0-60 MINS  12/30/2019   IR PERC CHOLECYSTOSTOMY  01/27/2020   IR RADIOLOGIST EVAL & MGMT  05/16/2020   PERIPHERAL VASCULAR CATHETERIZATION N/A 05/04/2015   Procedure: Lower Extremity Angiography;  Surgeon: Lorretta Harp, MD;  Location: [facility];  Service: Cardiovascular;  Laterality: N/A;   PERIPHERAL VASCULAR CATHETERIZATION  05/04/2015   Procedure: Peripheral Vascular Intervention;  Surgeon: Lorretta Harp, MD;  Location: [facility];  Service: Cardiovascular;;  RCIA - 7x22 ICAST   PERIPHERAL VASCULAR CATHETERIZATION Right 09/04/2015   Procedure: Peripheral Vascular Atherectomy;  Surgeon: Runell Gess, MD;  Location: [facility];  Service: Cardiovascular;  Laterality: Right;  SFA   sfa Right 09/04/2015   de balloon   THYROID SURGERY Right ?2013   "had goiter taken off my neck"   TONSILLECTOMY     VAGINAL HYSTERECTOMY      Family History  Problem Relation Age of Onset   Heart disease Mother    Heart disease Father    Heart attack Father    Cancer Sister        Breast Cancer   Atrial fibrillation Sister    Stroke Maternal  Grandmother    Cancer Paternal Grandmother    Coronary artery disease Other     Allergies  Allergen Reactions   Codeine     hallucinations    Current Medications:   Current Outpatient Medications:    Albuterol Sulfate (PROAIR RESPICLICK) 108 (90 Base) MCG/ACT AEPB, Inhale 2 puffs into the lungs every 6 (six) hours as needed (shortness of breath from COPD)., Disp: 1 each, Rfl: 5   amoxicillin-clavulanate (AUGMENTIN) 875-125 MG tablet, Take 1 tablet by mouth 2 (two) times daily for 7 days., Disp: 14 tablet, Rfl: 0   CALCIUM PO, Take by mouth., Disp: , Rfl:    carbidopa-levodopa (SINEMET IR) 25-100 MG tablet, TAKE 1 TABLET BY MOUTH DAILY AS NEEDED FOR WORSENING RLS, Disp: 90 tablet, Rfl: 1   Cholecalciferol (VITAMIN D3 PO), Take by mouth., Disp: , Rfl:    ferrous sulfate 325 (65 FE) MG tablet, Take 325 mg by mouth 2 (two) times a week. Patient takes one tablet on Monday and Friday, Disp: , Rfl:    furosemide (LASIX) 40 MG tablet, TAKE 1 TABLET TWICE A DAY, Disp: 180 tablet, Rfl: 3   irbesartan (AVAPRO) 300 MG tablet, Take 1 tablet (300 mg total) by mouth daily., Disp: 14 tablet, Rfl: 0   LINZESS 145 MCG CAPS capsule, Take 145 mcg by mouth daily as needed (constipation)., Disp: , Rfl:    mirtazapine (REMERON) 7.5 MG tablet, Take 1 tablet (7.5 mg total) by mouth at bedtime., Disp: 30 tablet, Rfl: 5   Multiple Vitamins-Minerals (PRESERVISION AREDS 2 PO), Take 1 tablet by mouth 2 (two) times daily., Disp: , Rfl:    polyethylene glycol (MIRALAX / GLYCOLAX) 17 g packet, Take 17 g by mouth 2 (two) times daily. Reported on 03/14/2016 (Patient taking differently: Take 17 g by mouth daily as needed for mild constipation.), Disp: , Rfl:    potassium chloride SA (KLOR-CON) 10 MEQ tablet, Take 1 tablet (10 mEq total) by mouth 2 (two) times daily as needed. Needs to be taken  With each dose of lasix, Disp: , Rfl:    rosuvastatin (CRESTOR) 40 MG tablet, TAKE 1 TABLET DAILY, Disp: 90 tablet, Rfl: 3    rotigotine (NEUPRO) 2 MG/24HR, Place 1 patch onto the skin daily., Disp: 30 patch, Rfl: 12   vitamin B-12 (CYANOCOBALAMIN) 1000 MCG tablet, Take 1,000 mcg by mouth daily., Disp: , Rfl:    amLODipine (NORVASC) 5 MG tablet, Take 1 tablet (5 mg total) by mouth daily., Disp: 30 tablet, Rfl: 5   Review of Systems:   Review of Systems  Constitutional:  Negative for fever.  HENT:  Negative for sinus pain and sore throat.   Respiratory:  Positive for cough. Negative for shortness of breath and wheezing.        +  chest congestion  Cardiovascular:  Negative for chest pain and palpitations.  Gastrointestinal:  Negative for blood in stool, constipation, diarrhea, nausea and vomiting.  Genitourinary:  Negative for dysuria, frequency and hematuria.  Musculoskeletal:  Positive for back pain. Negative for falls, joint pain and myalgias.  Skin:  Negative for rash.  Neurological:  Positive for weakness.    Vitals:   Vitals:   12/13/22 1125  BP: 120/68  Pulse: (!) 50  Temp: 97.7 F (36.5 C)  TempSrc: Temporal  SpO2: 96%  Weight: 149 lb (67.6 kg)  Height: 5\' 6"  (1.676 m)     Body mass index is 24.05 kg/m.  Physical Exam:   Physical Exam Vitals and nursing note reviewed.  Constitutional:      General: She is not in acute distress.    Appearance: She is well-developed. She is not ill-appearing or toxic-appearing.  Cardiovascular:     Rate and Rhythm: Normal rate and regular rhythm.     Pulses: Normal pulses.     Heart sounds: Normal heart sounds, S1 normal and S2 normal.  Pulmonary:     Effort: Pulmonary effort is normal.     Breath sounds: Normal breath sounds.  Musculoskeletal:     Comments: Unable to assess ROM due to back pain Reproducible tenderness with deep palpation to bilateral lumbar paraspinal muscles. No bony tenderness. .   Skin:    General: Skin is warm and dry.  Neurological:     Mental Status: She is alert.     GCS: GCS eye subscore is 4. GCS verbal subscore is 5.  GCS motor subscore is 6.  Psychiatric:        Speech: Speech normal.        Behavior: Behavior normal. Behavior is cooperative.     Assessment and Plan:   Chronic midline low back pain without sciatica No red flags Recommend tylenol prn, gentle stretches, consider SalonPas patch Consider imaging vs sports med vs PT if symptoms worsen/progress  Recurrent UTI No red flags Family requesting repeat urine test to r/o UTI Will order UA and urine culture She has had one dose of Augmentin -- did discuss that this could potentially skew results, however we will assess regardless  Cough No red flags She has had one dose of augmentin  Discussed that this may take a few more weeks to completely resolved Lungs CTA and oxygen level is stable Recommend completion of augmentin  Follow-up PCP for the above issues if sx fail to improve or respond to treatment.  I,Alexis Herring,acting as a Education administrator for [employer], PA.,have documented all relevant documentation on the behalf of Inda Coke, PA,as directed by  Inda Coke, PA while in the presence of Inda Coke, [place].  I, Inda Coke, [place], have reviewed all documentation for this visit. The documentation on 12/13/22 for the exam, diagnosis, procedures, and orders are all accurate and complete.  Time spent with patient today was 37 minutes which consisted of chart review, discussing diagnosis, work up, treatment answering questions and documentation.   Inda Coke, PA-C

## 2022-12-13 NOTE — Patient Instructions (Signed)
It was great to see you!  Continue the antibiotic as prescribed  I will be in touch with your urine test results  If any new/worsening symptoms, please reach out to Dr Yong Channel  Consider topical lidocaine patches for your back pain  Take care,  Inda Coke PA-C

## 2022-12-14 LAB — URINE CULTURE
MICRO NUMBER:: 14424875
SPECIMEN QUALITY:: ADEQUATE

## 2022-12-16 DIAGNOSIS — M48061 Spinal stenosis, lumbar region without neurogenic claudication: Secondary | ICD-10-CM | POA: Diagnosis not present

## 2022-12-16 DIAGNOSIS — M25512 Pain in left shoulder: Secondary | ICD-10-CM | POA: Diagnosis not present

## 2022-12-16 DIAGNOSIS — G20A1 Parkinson's disease without dyskinesia, without mention of fluctuations: Secondary | ICD-10-CM | POA: Diagnosis not present

## 2022-12-16 DIAGNOSIS — I5032 Chronic diastolic (congestive) heart failure: Secondary | ICD-10-CM | POA: Diagnosis not present

## 2022-12-16 DIAGNOSIS — M25511 Pain in right shoulder: Secondary | ICD-10-CM | POA: Diagnosis not present

## 2022-12-16 DIAGNOSIS — I110 Hypertensive heart disease with heart failure: Secondary | ICD-10-CM | POA: Diagnosis not present

## 2022-12-17 DIAGNOSIS — M25511 Pain in right shoulder: Secondary | ICD-10-CM | POA: Diagnosis not present

## 2022-12-17 DIAGNOSIS — M25512 Pain in left shoulder: Secondary | ICD-10-CM | POA: Diagnosis not present

## 2022-12-17 DIAGNOSIS — M48061 Spinal stenosis, lumbar region without neurogenic claudication: Secondary | ICD-10-CM | POA: Diagnosis not present

## 2022-12-17 DIAGNOSIS — I5032 Chronic diastolic (congestive) heart failure: Secondary | ICD-10-CM | POA: Diagnosis not present

## 2022-12-17 DIAGNOSIS — G20A1 Parkinson's disease without dyskinesia, without mention of fluctuations: Secondary | ICD-10-CM | POA: Diagnosis not present

## 2022-12-17 DIAGNOSIS — I110 Hypertensive heart disease with heart failure: Secondary | ICD-10-CM | POA: Diagnosis not present

## 2022-12-18 DIAGNOSIS — M48061 Spinal stenosis, lumbar region without neurogenic claudication: Secondary | ICD-10-CM | POA: Diagnosis not present

## 2022-12-18 DIAGNOSIS — M25512 Pain in left shoulder: Secondary | ICD-10-CM | POA: Diagnosis not present

## 2022-12-18 DIAGNOSIS — I5032 Chronic diastolic (congestive) heart failure: Secondary | ICD-10-CM | POA: Diagnosis not present

## 2022-12-18 DIAGNOSIS — I110 Hypertensive heart disease with heart failure: Secondary | ICD-10-CM | POA: Diagnosis not present

## 2022-12-18 DIAGNOSIS — G20A1 Parkinson's disease without dyskinesia, without mention of fluctuations: Secondary | ICD-10-CM | POA: Diagnosis not present

## 2022-12-18 DIAGNOSIS — M25511 Pain in right shoulder: Secondary | ICD-10-CM | POA: Diagnosis not present

## 2022-12-20 ENCOUNTER — Telehealth: Payer: Self-pay | Admitting: Family Medicine

## 2022-12-20 NOTE — Telephone Encounter (Signed)
Form received and placed in your review folder.

## 2022-12-20 NOTE — Telephone Encounter (Signed)
Form has been faxed and continence status has been confirmed with pt sister [name] ([email or web address]).

## 2022-12-20 NOTE — Telephone Encounter (Signed)
Placed back on Keba's desk- check on continence status and add meds then good to be sent in. Thanks

## 2022-12-20 NOTE — Telephone Encounter (Signed)
Caller states: - FL2 form was faxed over on 12/18/22 for PCP to complete - Needs FL2 form filled out asap since [facility] nursing [facility] now has openings   I recommended that caller re-fax form since it wasn't showing up in fax database. Caller verbalized understanding and will do so today.

## 2022-12-21 DIAGNOSIS — M810 Age-related osteoporosis without current pathological fracture: Secondary | ICD-10-CM | POA: Diagnosis not present

## 2022-12-21 DIAGNOSIS — I701 Atherosclerosis of renal artery: Secondary | ICD-10-CM | POA: Diagnosis not present

## 2022-12-21 DIAGNOSIS — G20A1 Parkinson's disease without dyskinesia, without mention of fluctuations: Secondary | ICD-10-CM | POA: Diagnosis not present

## 2022-12-21 DIAGNOSIS — G4700 Insomnia, unspecified: Secondary | ICD-10-CM | POA: Diagnosis not present

## 2022-12-21 DIAGNOSIS — M48061 Spinal stenosis, lumbar region without neurogenic claudication: Secondary | ICD-10-CM | POA: Diagnosis not present

## 2022-12-21 DIAGNOSIS — I110 Hypertensive heart disease with heart failure: Secondary | ICD-10-CM | POA: Diagnosis not present

## 2022-12-21 DIAGNOSIS — I77811 Abdominal aortic ectasia: Secondary | ICD-10-CM | POA: Diagnosis not present

## 2022-12-21 DIAGNOSIS — K219 Gastro-esophageal reflux disease without esophagitis: Secondary | ICD-10-CM | POA: Diagnosis not present

## 2022-12-21 DIAGNOSIS — J449 Chronic obstructive pulmonary disease, unspecified: Secondary | ICD-10-CM | POA: Diagnosis not present

## 2022-12-21 DIAGNOSIS — I739 Peripheral vascular disease, unspecified: Secondary | ICD-10-CM | POA: Diagnosis not present

## 2022-12-21 DIAGNOSIS — D509 Iron deficiency anemia, unspecified: Secondary | ICD-10-CM | POA: Diagnosis not present

## 2022-12-21 DIAGNOSIS — I4891 Unspecified atrial fibrillation: Secondary | ICD-10-CM | POA: Diagnosis not present

## 2022-12-21 DIAGNOSIS — H8110 Benign paroxysmal vertigo, unspecified ear: Secondary | ICD-10-CM | POA: Diagnosis not present

## 2022-12-21 DIAGNOSIS — F329 Major depressive disorder, single episode, unspecified: Secondary | ICD-10-CM | POA: Diagnosis not present

## 2022-12-21 DIAGNOSIS — E042 Nontoxic multinodular goiter: Secondary | ICD-10-CM | POA: Diagnosis not present

## 2022-12-21 DIAGNOSIS — G2581 Restless legs syndrome: Secondary | ICD-10-CM | POA: Diagnosis not present

## 2022-12-21 DIAGNOSIS — M25511 Pain in right shoulder: Secondary | ICD-10-CM | POA: Diagnosis not present

## 2022-12-21 DIAGNOSIS — I5032 Chronic diastolic (congestive) heart failure: Secondary | ICD-10-CM | POA: Diagnosis not present

## 2022-12-21 DIAGNOSIS — M25512 Pain in left shoulder: Secondary | ICD-10-CM | POA: Diagnosis not present

## 2022-12-21 DIAGNOSIS — E785 Hyperlipidemia, unspecified: Secondary | ICD-10-CM | POA: Diagnosis not present

## 2022-12-21 DIAGNOSIS — Z87440 Personal history of urinary (tract) infections: Secondary | ICD-10-CM | POA: Diagnosis not present

## 2022-12-21 DIAGNOSIS — Z7901 Long term (current) use of anticoagulants: Secondary | ICD-10-CM | POA: Diagnosis not present

## 2022-12-21 DIAGNOSIS — Z8616 Personal history of COVID-19: Secondary | ICD-10-CM | POA: Diagnosis not present

## 2022-12-21 DIAGNOSIS — Z87891 Personal history of nicotine dependence: Secondary | ICD-10-CM | POA: Diagnosis not present

## 2022-12-21 DIAGNOSIS — G8929 Other chronic pain: Secondary | ICD-10-CM | POA: Diagnosis not present

## 2022-12-23 DIAGNOSIS — M25511 Pain in right shoulder: Secondary | ICD-10-CM | POA: Diagnosis not present

## 2022-12-23 DIAGNOSIS — G20A1 Parkinson's disease without dyskinesia, without mention of fluctuations: Secondary | ICD-10-CM | POA: Diagnosis not present

## 2022-12-23 DIAGNOSIS — I110 Hypertensive heart disease with heart failure: Secondary | ICD-10-CM | POA: Diagnosis not present

## 2022-12-23 DIAGNOSIS — G8929 Other chronic pain: Secondary | ICD-10-CM | POA: Diagnosis not present

## 2022-12-23 DIAGNOSIS — M48061 Spinal stenosis, lumbar region without neurogenic claudication: Secondary | ICD-10-CM | POA: Diagnosis not present

## 2022-12-23 DIAGNOSIS — M25512 Pain in left shoulder: Secondary | ICD-10-CM | POA: Diagnosis not present

## 2022-12-24 DIAGNOSIS — G8929 Other chronic pain: Secondary | ICD-10-CM | POA: Diagnosis not present

## 2022-12-24 DIAGNOSIS — M48061 Spinal stenosis, lumbar region without neurogenic claudication: Secondary | ICD-10-CM | POA: Diagnosis not present

## 2022-12-24 DIAGNOSIS — G20A1 Parkinson's disease without dyskinesia, without mention of fluctuations: Secondary | ICD-10-CM | POA: Diagnosis not present

## 2022-12-24 DIAGNOSIS — I110 Hypertensive heart disease with heart failure: Secondary | ICD-10-CM | POA: Diagnosis not present

## 2022-12-24 DIAGNOSIS — M25512 Pain in left shoulder: Secondary | ICD-10-CM | POA: Diagnosis not present

## 2022-12-24 DIAGNOSIS — M25511 Pain in right shoulder: Secondary | ICD-10-CM | POA: Diagnosis not present

## 2022-12-25 ENCOUNTER — Telehealth: Payer: Self-pay

## 2022-12-25 DIAGNOSIS — M48061 Spinal stenosis, lumbar region without neurogenic claudication: Secondary | ICD-10-CM | POA: Diagnosis not present

## 2022-12-25 DIAGNOSIS — I110 Hypertensive heart disease with heart failure: Secondary | ICD-10-CM | POA: Diagnosis not present

## 2022-12-25 DIAGNOSIS — G8929 Other chronic pain: Secondary | ICD-10-CM | POA: Diagnosis not present

## 2022-12-25 DIAGNOSIS — M25512 Pain in left shoulder: Secondary | ICD-10-CM | POA: Diagnosis not present

## 2022-12-25 DIAGNOSIS — M25511 Pain in right shoulder: Secondary | ICD-10-CM | POA: Diagnosis not present

## 2022-12-25 DIAGNOSIS — G20A1 Parkinson's disease without dyskinesia, without mention of fluctuations: Secondary | ICD-10-CM | POA: Diagnosis not present

## 2022-12-25 NOTE — Telephone Encounter (Signed)
Called and lm for Cheryl Atkinson tcb regarding paperwork for pt.

## 2022-12-26 ENCOUNTER — Telehealth: Payer: Self-pay | Admitting: Family Medicine

## 2022-12-26 NOTE — Telephone Encounter (Signed)
.  Home Health Certification or Plan of Care Tracking  Is this a Certification or Plan of Care? yes  [facility] Agency: [place]  Order Number:  [id number]  Has charge sheet been attached? yes  Where has form been placed:  In provider's box  Faxed to:   [phone]

## 2022-12-26 NOTE — Telephone Encounter (Signed)
Noted  

## 2022-12-27 ENCOUNTER — Telehealth: Payer: Self-pay | Admitting: Family Medicine

## 2022-12-27 ENCOUNTER — Other Ambulatory Visit: Payer: Self-pay | Admitting: Physician Assistant

## 2022-12-27 NOTE — Telephone Encounter (Signed)
Pt's sister is requesting a wheelchair on behalf of the pt. Stated if their are any questions, please give her a call. Would like a call once the order is placed.

## 2022-12-27 NOTE — Telephone Encounter (Signed)
Called and spoke with Cheryl Atkinson to see where the order needs to be faxed to. Cheryl Atkinson is going to call pt insurance to see what medical supply stores accepts pt insurance and she will cb with name and fax numbers.

## 2022-12-30 DIAGNOSIS — G8929 Other chronic pain: Secondary | ICD-10-CM | POA: Diagnosis not present

## 2022-12-30 DIAGNOSIS — G20A1 Parkinson's disease without dyskinesia, without mention of fluctuations: Secondary | ICD-10-CM | POA: Diagnosis not present

## 2022-12-30 DIAGNOSIS — M25511 Pain in right shoulder: Secondary | ICD-10-CM | POA: Diagnosis not present

## 2022-12-30 DIAGNOSIS — I110 Hypertensive heart disease with heart failure: Secondary | ICD-10-CM | POA: Diagnosis not present

## 2022-12-30 DIAGNOSIS — M48061 Spinal stenosis, lumbar region without neurogenic claudication: Secondary | ICD-10-CM | POA: Diagnosis not present

## 2022-12-30 DIAGNOSIS — M25512 Pain in left shoulder: Secondary | ICD-10-CM | POA: Diagnosis not present

## 2022-12-31 ENCOUNTER — Other Ambulatory Visit: Payer: Self-pay | Admitting: Family Medicine

## 2022-12-31 DIAGNOSIS — I110 Hypertensive heart disease with heart failure: Secondary | ICD-10-CM | POA: Diagnosis not present

## 2022-12-31 DIAGNOSIS — G20A1 Parkinson's disease without dyskinesia, without mention of fluctuations: Secondary | ICD-10-CM | POA: Diagnosis not present

## 2022-12-31 DIAGNOSIS — G8929 Other chronic pain: Secondary | ICD-10-CM | POA: Diagnosis not present

## 2022-12-31 DIAGNOSIS — M25511 Pain in right shoulder: Secondary | ICD-10-CM | POA: Diagnosis not present

## 2022-12-31 DIAGNOSIS — M25512 Pain in left shoulder: Secondary | ICD-10-CM | POA: Diagnosis not present

## 2022-12-31 DIAGNOSIS — M48061 Spinal stenosis, lumbar region without neurogenic claudication: Secondary | ICD-10-CM | POA: Diagnosis not present

## 2023-01-05 DIAGNOSIS — M48061 Spinal stenosis, lumbar region without neurogenic claudication: Secondary | ICD-10-CM | POA: Diagnosis not present

## 2023-01-05 DIAGNOSIS — I110 Hypertensive heart disease with heart failure: Secondary | ICD-10-CM | POA: Diagnosis not present

## 2023-01-05 DIAGNOSIS — M25511 Pain in right shoulder: Secondary | ICD-10-CM | POA: Diagnosis not present

## 2023-01-05 DIAGNOSIS — G20A1 Parkinson's disease without dyskinesia, without mention of fluctuations: Secondary | ICD-10-CM | POA: Diagnosis not present

## 2023-01-05 DIAGNOSIS — G8929 Other chronic pain: Secondary | ICD-10-CM | POA: Diagnosis not present

## 2023-01-05 DIAGNOSIS — M25512 Pain in left shoulder: Secondary | ICD-10-CM | POA: Diagnosis not present

## 2023-01-07 ENCOUNTER — Ambulatory Visit (INDEPENDENT_AMBULATORY_CARE_PROVIDER_SITE_OTHER): Payer: Medicare Other

## 2023-01-07 DIAGNOSIS — Z111 Encounter for screening for respiratory tuberculosis: Secondary | ICD-10-CM | POA: Diagnosis not present

## 2023-01-07 NOTE — Progress Notes (Signed)
Cheryl Atkinson 89 yr old female here today for TB skin test per Garret Reddish, MD. Administered MANTOUX intradermal left forearm 1 unit. Patient aware to not place bandage on injection site. Aware to return Thursday to have skin test read.

## 2023-01-08 ENCOUNTER — Encounter (INDEPENDENT_AMBULATORY_CARE_PROVIDER_SITE_OTHER): Payer: Medicare Other | Admitting: Ophthalmology

## 2023-01-08 DIAGNOSIS — H35033 Hypertensive retinopathy, bilateral: Secondary | ICD-10-CM | POA: Diagnosis not present

## 2023-01-08 DIAGNOSIS — H43813 Vitreous degeneration, bilateral: Secondary | ICD-10-CM | POA: Diagnosis not present

## 2023-01-08 DIAGNOSIS — H353112 Nonexudative age-related macular degeneration, right eye, intermediate dry stage: Secondary | ICD-10-CM

## 2023-01-08 DIAGNOSIS — H353221 Exudative age-related macular degeneration, left eye, with active choroidal neovascularization: Secondary | ICD-10-CM | POA: Diagnosis not present

## 2023-01-08 DIAGNOSIS — I10 Essential (primary) hypertension: Secondary | ICD-10-CM | POA: Diagnosis not present

## 2023-01-09 ENCOUNTER — Encounter: Payer: Self-pay | Admitting: Family Medicine

## 2023-01-09 ENCOUNTER — Ambulatory Visit (INDEPENDENT_AMBULATORY_CARE_PROVIDER_SITE_OTHER): Payer: Medicare Other | Admitting: Family Medicine

## 2023-01-09 VITALS — BP 120/80 | HR 85 | Temp 98.7°F | Ht 66.0 in

## 2023-01-09 DIAGNOSIS — M48061 Spinal stenosis, lumbar region without neurogenic claudication: Secondary | ICD-10-CM | POA: Diagnosis not present

## 2023-01-09 DIAGNOSIS — I10 Essential (primary) hypertension: Secondary | ICD-10-CM | POA: Diagnosis not present

## 2023-01-09 DIAGNOSIS — I4891 Unspecified atrial fibrillation: Secondary | ICD-10-CM

## 2023-01-09 DIAGNOSIS — E538 Deficiency of other specified B group vitamins: Secondary | ICD-10-CM | POA: Diagnosis not present

## 2023-01-09 DIAGNOSIS — M25512 Pain in left shoulder: Secondary | ICD-10-CM | POA: Diagnosis not present

## 2023-01-09 DIAGNOSIS — M25511 Pain in right shoulder: Secondary | ICD-10-CM | POA: Diagnosis not present

## 2023-01-09 DIAGNOSIS — G20A1 Parkinson's disease without dyskinesia, without mention of fluctuations: Secondary | ICD-10-CM | POA: Diagnosis not present

## 2023-01-09 DIAGNOSIS — I5032 Chronic diastolic (congestive) heart failure: Secondary | ICD-10-CM | POA: Diagnosis not present

## 2023-01-09 DIAGNOSIS — G8929 Other chronic pain: Secondary | ICD-10-CM | POA: Diagnosis not present

## 2023-01-09 DIAGNOSIS — I110 Hypertensive heart disease with heart failure: Secondary | ICD-10-CM | POA: Diagnosis not present

## 2023-01-09 LAB — TB SKIN TEST
Induration: 0 mm
TB Skin Test: NEGATIVE

## 2023-01-09 MED ORDER — FUROSEMIDE 40 MG PO TABS: 40.0000 mg | ORAL_TABLET | Freq: Every day | ORAL | 3 refills | Status: DC

## 2023-01-09 MED ORDER — POLYETHYLENE GLYCOL 3350 17 G PO PACK
17.0000 g | PACK | Freq: Every day | ORAL | Status: DC | PRN
Start: 2023-01-09 — End: 2023-02-24

## 2023-01-09 MED ORDER — POTASSIUM CHLORIDE CRYS ER 10 MEQ PO TBCR: 10.0000 meq | EXTENDED_RELEASE_TABLET | Freq: Every day | ORAL | Status: DC | PRN

## 2023-01-09 NOTE — Progress Notes (Signed)
Phone [phone] In person visit   Subjective:   Cheryl Atkinson is a 89 y.o. year old very pleasant female patient who presents for/with See problem oriented charting Chief Complaint  Patient presents with   Follow-up    Pt has no questions or concern s   Hypertension   Past Medical History-  Patient Active Problem List   Diagnosis Date Noted   Pleural effusion due to CHF (congestive heart failure) ([place]) 05/26/2020    Priority: High   Heart failure with preserved ejection fraction ([place]) 03/01/2020    Priority: High   Bacteremia 01/28/2020    Priority: High   Atrial fibrillation ([place]) 01/25/2020    Priority: High   Memory loss 04/11/2016    Priority: High   Renal artery stenosis ([place]) 09/27/2015    Priority: High   Claudication ([place]) 05/04/2015    Priority: High   Atherosclerotic PVD with intermittent claudication ([place]) 04/26/2015    Priority: High   DNR (do not resuscitate) 09/29/2014    Priority: High   Hyperglycemia 07/19/2014    Priority: High   LOW BACK PAIN 09/25/2007    Priority: High   B12 deficiency 02/27/2021    Priority: Medium    Insomnia 03/01/2020    Priority: Medium    Major depression 03/25/2018    Priority: Medium    BPPV (benign paroxysmal positional vertigo) 07/12/2016    Priority: Medium    Hyperlipidemia 06/30/2015    Priority: Medium    Former smoker 09/29/2014    Priority: Medium    COPD (chronic obstructive pulmonary disease) ([place]) 09/20/2009    Priority: Medium    Iron deficiency anemia 11/09/2007    Priority: Medium    RESTLESS LEG SYNDROME 09/25/2007    Priority: Medium    Essential hypertension 09/25/2007    Priority: Medium    GERD 09/25/2007    Priority: Medium    Osteoporosis 09/25/2007    Priority: Medium    COVID-19 virus infection 08/29/2021    Priority: Low   S/P laparoscopic cholecystectomy 04/04/2020    Priority: Low   Abdominal aortic ectasia ([place]) 12/29/2019    Priority: Low   Cat bite of right hand  12/21/2016    Priority: Low   Mallet toe of right foot 10/20/2015    Priority: Low   Tinnitus 12/31/2014    Priority: Low   Multinodular goiter 08/30/2013    Priority: Low   Spinal stenosis of lumbar region at multiple levels 09/29/2012    Priority: Low   HIP PAIN, BILATERAL 07/16/2010    Priority: Low   CONSTIPATION, CHRONIC 09/20/2009    Priority: Low   History of UTI 11/09/2007    Priority: Low   Renal lesion 08/29/2021    Medications- reviewed and updated Current Outpatient Medications  Medication Sig Dispense Refill   amLODipine (NORVASC) 5 MG tablet TAKE 1 TABLET(5 MG) BY MOUTH DAILY 30 tablet 5   carbidopa-levodopa (SINEMET IR) 25-100 MG tablet TAKE 1 TABLET BY MOUTH DAILY AS NEEDED FOR WORSENING RLS 90 tablet 1   ferrous sulfate 325 (65 FE) MG tablet Take 325 mg by mouth 2 (two) times a week. Patient takes one tablet on Monday and Friday     irbesartan (AVAPRO) 300 MG tablet Take 1 tablet (300 mg total) by mouth daily. 14 tablet 0   mirtazapine (REMERON) 7.5 MG tablet Take 1 tablet (7.5 mg total) by mouth at bedtime. 30 tablet 5   Multiple Vitamins-Minerals (PRESERVISION AREDS 2 PO) Take 1 tablet  by mouth 2 (two) times daily.     rosuvastatin (CRESTOR) 40 MG tablet TAKE 1 TABLET DAILY 90 tablet 3   rotigotine (NEUPRO) 2 MG/24HR Place 1 patch onto the skin daily. 30 patch 12   furosemide (LASIX) 40 MG tablet Take 1 tablet (40 mg total) by mouth daily. 90 tablet 3   polyethylene glycol (MIRALAX / GLYCOLAX) 17 g packet Take 17 g by mouth daily as needed for mild constipation.     potassium chloride (KLOR-CON M) 10 MEQ tablet Take 1 tablet (10 mEq total) by mouth daily as needed (take with each dose of lasix.). Needs to be taken  With each dose of lasix     No current facility-administered medications for this visit.     Objective:  BP 120/80   Pulse 85   Temp 98.7 F (37.1 C) (Temporal)   Ht 5\' 6"  (1.676 m)   LMP  (LMP Unknown)   SpO2 99%   BMI 24.05 kg/m  Gen:  NAD, resting comfortably CV: RRR no murmurs rubs or gallops Lungs: CTAB no crackles, wheeze, rhonchi Ext: no edema Skin: warm, dry Neuro: Wheelchair-bound- supported by family    Assessment and Plan   #Social update- stress with son having to leave the home- he's age 40.  - planning to move to [facility] senior living- have already sent FL2 but needs updated med list  - needs new DNR form for facility -also answered multiple questions about meds- reducing meds can reduce her cost at facility so we wanted minimal regimen possible. Has osteoporosis but opts out of calcium and vitamin D and opts in b12 injection over daily pill.   # Sinus pressure S: At visit in December patient had about 3 days of sinus pressure.  COVID testing was negative.  We did eventually treat her with Augmentin starting January 11 A/P: she reports pressure has resolved thankfully    # Atrial fibrillation- new onset 2021 S: Rate controlled without medication  Anticoagulated with no rx- prior  eliquis 2.5 mg BID stopped due to falls A/P: Atrial fibrillation is rate controlled without medication.  She is not anticoagulated due to fall risk  #CHF with preserved EF  #Hypertension S: Compliant with irbesartan 300 mg (ok per cards as only unilateral renal artery stenosis) and lasix 40mg  daily-twice daily in the past but thankfully has not had increased edema or weight gain, amlodipine 5 mg (history of edema but had to be restarted for blood pressure) BP Readings from Last 3 Encounters:  01/09/23 120/80  12/13/22 120/68  11/21/22 120/80  A/P: Hypertension is stable-continue current medication.  CHF appears euvolemic-continue current medication and updated prescription to reflect once daily dosing of Lasix and now only once daily dosing of potassium   #Restless leg syndrome S: Patient on Neupro 3 mg (history of augmentation). Ferritin levels have been low in the past and remains on iron -Also takes Sinemet as needed  per Dr. Posey Pronto (? parkinsons as well)  A/P: Reasonable control on current medication but still gets some breakthrough so she prefers to have sinet available   #Hyperlipidemia/peripheral vascular disease/renal artery stenosis S: Compliant with rosuvastatin 40mg   with LDL above 70  - stopped 9/21/2 eliquis (prior plavix/asa prior to a fib).   -She has known atherosclerotic peripheral vascular disease with intermittent claudication-stable with claudication at approximately 200 feet but mainly wheelchair bound with limited walker walkingat this point so not experiencing  Lab Results  Component Value Date   CHOL 163 02/12/2022  HDL 86.70 02/12/2022   LDLCALC 68 02/12/2022   LDLDIRECT 60.0 10/09/2021   TRIG 45.0 02/12/2022   CHOLHDL 2 02/12/2022    A/P: Lipids reasonably controlled-continue current medication.  Not quite due for lipid panel  # B12 deficiency S: Current treatment/medication (oral vs. IM):  wants to change to IM February 2024 A/P: Has been doing reasonably well on orals but wants to change to once monthly injection to reduce pill frequency  Recommended follow up: Return in about 2 months (around 03/10/2023) for followup or sooner if needed.Schedule b4 you leave. Future Appointments  Date Time Provider [place]  02/05/2023  8:20 AM Hayden Pedro, MD [facility] None  03/17/2023 11:00 AM Marin Olp, MD [facility]  04/23/2023  8:50 AM Narda Amber K, DO [facility] None    Lab/Order associations:   ICD-10-CM   1. Chronic heart failure with preserved ejection fraction (HCC)  I50.32 DME Wheelchair manual    2. Atrial fibrillation, unspecified type (HCC) Chronic I48.91     3. B12 deficiency  E53.8     4. Essential hypertension  I10       Meds ordered this encounter  Medications   furosemide (LASIX) 40 MG tablet    Sig: Take 1 tablet (40 mg total) by mouth daily.    Dispense:  90 tablet    Refill:  3   polyethylene glycol (MIRALAX / GLYCOLAX) 17 g packet     Sig: Take 17 g by mouth daily as needed for mild constipation.   potassium chloride (KLOR-CON M) 10 MEQ tablet    Sig: Take 1 tablet (10 mEq total) by mouth daily as needed (take with each dose of lasix.). Needs to be taken  With each dose of lasix    Return precautions advised.  Garret Reddish, MD

## 2023-01-09 NOTE — Patient Instructions (Addendum)
Schedule monthly b12 injection- 1 month out from starting at [facility] please print new med list that they will submit  Give copy of wheelchair printout  Print copy of TB report please  Recommended follow up: Return in about 2 months (around 03/10/2023) for followup or sooner if needed.Schedule b4 you leave.

## 2023-01-11 DIAGNOSIS — F32A Depression, unspecified: Secondary | ICD-10-CM | POA: Diagnosis not present

## 2023-01-11 DIAGNOSIS — I739 Peripheral vascular disease, unspecified: Secondary | ICD-10-CM | POA: Diagnosis not present

## 2023-01-11 DIAGNOSIS — R509 Fever, unspecified: Secondary | ICD-10-CM | POA: Diagnosis not present

## 2023-01-11 DIAGNOSIS — K219 Gastro-esophageal reflux disease without esophagitis: Secondary | ICD-10-CM | POA: Diagnosis not present

## 2023-01-11 DIAGNOSIS — E785 Hyperlipidemia, unspecified: Secondary | ICD-10-CM | POA: Diagnosis not present

## 2023-01-11 DIAGNOSIS — U071 COVID-19: Secondary | ICD-10-CM | POA: Diagnosis not present

## 2023-01-11 DIAGNOSIS — Z7902 Long term (current) use of antithrombotics/antiplatelets: Secondary | ICD-10-CM | POA: Diagnosis not present

## 2023-01-11 DIAGNOSIS — I4891 Unspecified atrial fibrillation: Secondary | ICD-10-CM | POA: Diagnosis not present

## 2023-01-11 DIAGNOSIS — M48061 Spinal stenosis, lumbar region without neurogenic claudication: Secondary | ICD-10-CM | POA: Diagnosis not present

## 2023-01-11 DIAGNOSIS — I701 Atherosclerosis of renal artery: Secondary | ICD-10-CM | POA: Diagnosis not present

## 2023-01-11 DIAGNOSIS — G2581 Restless legs syndrome: Secondary | ICD-10-CM | POA: Diagnosis not present

## 2023-01-11 DIAGNOSIS — Z743 Need for continuous supervision: Secondary | ICD-10-CM | POA: Diagnosis not present

## 2023-01-11 DIAGNOSIS — R918 Other nonspecific abnormal finding of lung field: Secondary | ICD-10-CM | POA: Diagnosis not present

## 2023-01-11 DIAGNOSIS — G9340 Encephalopathy, unspecified: Secondary | ICD-10-CM | POA: Diagnosis not present

## 2023-01-11 DIAGNOSIS — Z885 Allergy status to narcotic agent status: Secondary | ICD-10-CM | POA: Diagnosis not present

## 2023-01-11 DIAGNOSIS — F0390 Unspecified dementia without behavioral disturbance: Secondary | ICD-10-CM | POA: Diagnosis not present

## 2023-01-11 DIAGNOSIS — I10 Essential (primary) hypertension: Secondary | ICD-10-CM | POA: Diagnosis not present

## 2023-01-11 DIAGNOSIS — J449 Chronic obstructive pulmonary disease, unspecified: Secondary | ICD-10-CM | POA: Diagnosis not present

## 2023-01-11 DIAGNOSIS — D509 Iron deficiency anemia, unspecified: Secondary | ICD-10-CM | POA: Diagnosis not present

## 2023-01-11 DIAGNOSIS — I480 Paroxysmal atrial fibrillation: Secondary | ICD-10-CM | POA: Diagnosis not present

## 2023-01-11 DIAGNOSIS — I5030 Unspecified diastolic (congestive) heart failure: Secondary | ICD-10-CM | POA: Diagnosis not present

## 2023-01-11 DIAGNOSIS — R531 Weakness: Secondary | ICD-10-CM | POA: Diagnosis not present

## 2023-01-11 DIAGNOSIS — R29898 Other symptoms and signs involving the musculoskeletal system: Secondary | ICD-10-CM | POA: Diagnosis not present

## 2023-01-11 DIAGNOSIS — Z79899 Other long term (current) drug therapy: Secondary | ICD-10-CM | POA: Diagnosis not present

## 2023-01-11 DIAGNOSIS — J90 Pleural effusion, not elsewhere classified: Secondary | ICD-10-CM | POA: Diagnosis not present

## 2023-01-11 DIAGNOSIS — I4811 Longstanding persistent atrial fibrillation: Secondary | ICD-10-CM | POA: Diagnosis not present

## 2023-01-11 DIAGNOSIS — G20C Parkinsonism, unspecified: Secondary | ICD-10-CM | POA: Diagnosis not present

## 2023-01-11 DIAGNOSIS — I700 Atherosclerosis of aorta: Secondary | ICD-10-CM | POA: Diagnosis not present

## 2023-01-11 DIAGNOSIS — G4700 Insomnia, unspecified: Secondary | ICD-10-CM | POA: Diagnosis not present

## 2023-01-12 DIAGNOSIS — R531 Weakness: Secondary | ICD-10-CM | POA: Diagnosis not present

## 2023-01-12 DIAGNOSIS — R509 Fever, unspecified: Secondary | ICD-10-CM | POA: Diagnosis not present

## 2023-01-12 DIAGNOSIS — U071 COVID-19: Secondary | ICD-10-CM | POA: Diagnosis not present

## 2023-01-12 DIAGNOSIS — G9340 Encephalopathy, unspecified: Secondary | ICD-10-CM | POA: Diagnosis not present

## 2023-01-12 DIAGNOSIS — F0390 Unspecified dementia without behavioral disturbance: Secondary | ICD-10-CM | POA: Diagnosis not present

## 2023-01-13 ENCOUNTER — Other Ambulatory Visit: Payer: Self-pay | Admitting: Family Medicine

## 2023-01-13 DIAGNOSIS — I739 Peripheral vascular disease, unspecified: Secondary | ICD-10-CM | POA: Diagnosis present

## 2023-01-13 DIAGNOSIS — Z7902 Long term (current) use of antithrombotics/antiplatelets: Secondary | ICD-10-CM | POA: Diagnosis not present

## 2023-01-13 DIAGNOSIS — I4891 Unspecified atrial fibrillation: Secondary | ICD-10-CM | POA: Diagnosis present

## 2023-01-13 DIAGNOSIS — E785 Hyperlipidemia, unspecified: Secondary | ICD-10-CM | POA: Diagnosis not present

## 2023-01-13 DIAGNOSIS — I5030 Unspecified diastolic (congestive) heart failure: Secondary | ICD-10-CM | POA: Diagnosis not present

## 2023-01-13 DIAGNOSIS — Z885 Allergy status to narcotic agent status: Secondary | ICD-10-CM | POA: Diagnosis not present

## 2023-01-13 DIAGNOSIS — M48061 Spinal stenosis, lumbar region without neurogenic claudication: Secondary | ICD-10-CM | POA: Diagnosis not present

## 2023-01-13 DIAGNOSIS — Z79899 Other long term (current) drug therapy: Secondary | ICD-10-CM | POA: Diagnosis not present

## 2023-01-13 DIAGNOSIS — I701 Atherosclerosis of renal artery: Secondary | ICD-10-CM | POA: Diagnosis present

## 2023-01-13 DIAGNOSIS — G4700 Insomnia, unspecified: Secondary | ICD-10-CM | POA: Diagnosis not present

## 2023-01-13 DIAGNOSIS — D509 Iron deficiency anemia, unspecified: Secondary | ICD-10-CM | POA: Diagnosis present

## 2023-01-13 DIAGNOSIS — U071 COVID-19: Secondary | ICD-10-CM | POA: Diagnosis present

## 2023-01-13 DIAGNOSIS — I700 Atherosclerosis of aorta: Secondary | ICD-10-CM | POA: Diagnosis not present

## 2023-01-13 DIAGNOSIS — J449 Chronic obstructive pulmonary disease, unspecified: Secondary | ICD-10-CM | POA: Diagnosis not present

## 2023-01-13 DIAGNOSIS — G20C Parkinsonism, unspecified: Secondary | ICD-10-CM | POA: Diagnosis not present

## 2023-01-13 DIAGNOSIS — G2581 Restless legs syndrome: Secondary | ICD-10-CM | POA: Diagnosis present

## 2023-01-13 DIAGNOSIS — R29898 Other symptoms and signs involving the musculoskeletal system: Secondary | ICD-10-CM | POA: Diagnosis not present

## 2023-01-13 DIAGNOSIS — R531 Weakness: Secondary | ICD-10-CM | POA: Diagnosis present

## 2023-01-13 DIAGNOSIS — Z743 Need for continuous supervision: Secondary | ICD-10-CM | POA: Diagnosis not present

## 2023-01-13 DIAGNOSIS — I4811 Longstanding persistent atrial fibrillation: Secondary | ICD-10-CM | POA: Diagnosis not present

## 2023-01-13 DIAGNOSIS — I10 Essential (primary) hypertension: Secondary | ICD-10-CM | POA: Diagnosis present

## 2023-01-13 DIAGNOSIS — F32A Depression, unspecified: Secondary | ICD-10-CM | POA: Diagnosis not present

## 2023-01-13 DIAGNOSIS — K219 Gastro-esophageal reflux disease without esophagitis: Secondary | ICD-10-CM | POA: Diagnosis not present

## 2023-01-13 DIAGNOSIS — F0390 Unspecified dementia without behavioral disturbance: Secondary | ICD-10-CM | POA: Diagnosis present

## 2023-01-13 DIAGNOSIS — R509 Fever, unspecified: Secondary | ICD-10-CM | POA: Diagnosis not present

## 2023-01-13 DIAGNOSIS — I480 Paroxysmal atrial fibrillation: Secondary | ICD-10-CM | POA: Diagnosis not present

## 2023-01-22 ENCOUNTER — Telehealth: Payer: Self-pay | Admitting: Family Medicine

## 2023-01-22 DIAGNOSIS — R29898 Other symptoms and signs involving the musculoskeletal system: Secondary | ICD-10-CM | POA: Diagnosis not present

## 2023-01-22 DIAGNOSIS — F0390 Unspecified dementia without behavioral disturbance: Secondary | ICD-10-CM | POA: Diagnosis not present

## 2023-01-22 DIAGNOSIS — F32A Depression, unspecified: Secondary | ICD-10-CM | POA: Diagnosis not present

## 2023-01-22 DIAGNOSIS — B952 Enterococcus as the cause of diseases classified elsewhere: Secondary | ICD-10-CM | POA: Diagnosis not present

## 2023-01-22 DIAGNOSIS — R260 Ataxic gait: Secondary | ICD-10-CM | POA: Diagnosis not present

## 2023-01-22 DIAGNOSIS — I739 Peripheral vascular disease, unspecified: Secondary | ICD-10-CM | POA: Diagnosis not present

## 2023-01-22 DIAGNOSIS — I700 Atherosclerosis of aorta: Secondary | ICD-10-CM | POA: Diagnosis not present

## 2023-01-22 DIAGNOSIS — R531 Weakness: Secondary | ICD-10-CM | POA: Diagnosis not present

## 2023-01-22 DIAGNOSIS — M48061 Spinal stenosis, lumbar region without neurogenic claudication: Secondary | ICD-10-CM | POA: Diagnosis not present

## 2023-01-22 DIAGNOSIS — U071 COVID-19: Secondary | ICD-10-CM | POA: Diagnosis not present

## 2023-01-22 DIAGNOSIS — I4891 Unspecified atrial fibrillation: Secondary | ICD-10-CM | POA: Diagnosis not present

## 2023-01-22 DIAGNOSIS — Z743 Need for continuous supervision: Secondary | ICD-10-CM | POA: Diagnosis not present

## 2023-01-22 DIAGNOSIS — E785 Hyperlipidemia, unspecified: Secondary | ICD-10-CM | POA: Diagnosis not present

## 2023-01-22 DIAGNOSIS — G4700 Insomnia, unspecified: Secondary | ICD-10-CM | POA: Diagnosis not present

## 2023-01-22 DIAGNOSIS — G20C Parkinsonism, unspecified: Secondary | ICD-10-CM | POA: Diagnosis not present

## 2023-01-22 DIAGNOSIS — I10 Essential (primary) hypertension: Secondary | ICD-10-CM | POA: Diagnosis not present

## 2023-01-22 DIAGNOSIS — J449 Chronic obstructive pulmonary disease, unspecified: Secondary | ICD-10-CM | POA: Diagnosis not present

## 2023-01-22 DIAGNOSIS — I480 Paroxysmal atrial fibrillation: Secondary | ICD-10-CM | POA: Diagnosis not present

## 2023-01-22 DIAGNOSIS — D509 Iron deficiency anemia, unspecified: Secondary | ICD-10-CM | POA: Diagnosis not present

## 2023-01-22 DIAGNOSIS — I5030 Unspecified diastolic (congestive) heart failure: Secondary | ICD-10-CM | POA: Diagnosis not present

## 2023-01-22 DIAGNOSIS — K219 Gastro-esophageal reflux disease without esophagitis: Secondary | ICD-10-CM | POA: Diagnosis not present

## 2023-01-22 DIAGNOSIS — I701 Atherosclerosis of renal artery: Secondary | ICD-10-CM | POA: Diagnosis not present

## 2023-01-22 DIAGNOSIS — R112 Nausea with vomiting, unspecified: Secondary | ICD-10-CM | POA: Diagnosis not present

## 2023-01-22 DIAGNOSIS — N390 Urinary tract infection, site not specified: Secondary | ICD-10-CM | POA: Diagnosis not present

## 2023-01-22 DIAGNOSIS — G2581 Restless legs syndrome: Secondary | ICD-10-CM | POA: Diagnosis not present

## 2023-01-22 NOTE — Telephone Encounter (Signed)
Pt med list has been faxed.

## 2023-01-22 NOTE — Telephone Encounter (Signed)
Pt is in rehab and the facility needs a copy of his med list to be faxed to them. [phone]. Please advise.

## 2023-01-24 DIAGNOSIS — U071 COVID-19: Secondary | ICD-10-CM | POA: Diagnosis not present

## 2023-01-24 DIAGNOSIS — I5030 Unspecified diastolic (congestive) heart failure: Secondary | ICD-10-CM | POA: Diagnosis not present

## 2023-01-24 DIAGNOSIS — J449 Chronic obstructive pulmonary disease, unspecified: Secondary | ICD-10-CM | POA: Diagnosis not present

## 2023-01-24 DIAGNOSIS — R260 Ataxic gait: Secondary | ICD-10-CM | POA: Diagnosis not present

## 2023-02-05 ENCOUNTER — Encounter (INDEPENDENT_AMBULATORY_CARE_PROVIDER_SITE_OTHER): Payer: Medicare Other | Admitting: Ophthalmology

## 2023-02-18 DIAGNOSIS — B952 Enterococcus as the cause of diseases classified elsewhere: Secondary | ICD-10-CM | POA: Diagnosis not present

## 2023-02-18 DIAGNOSIS — R112 Nausea with vomiting, unspecified: Secondary | ICD-10-CM | POA: Diagnosis not present

## 2023-02-18 DIAGNOSIS — N390 Urinary tract infection, site not specified: Secondary | ICD-10-CM | POA: Diagnosis not present

## 2023-02-21 DIAGNOSIS — I700 Atherosclerosis of aorta: Secondary | ICD-10-CM | POA: Diagnosis not present

## 2023-02-21 DIAGNOSIS — Z90710 Acquired absence of both cervix and uterus: Secondary | ICD-10-CM | POA: Diagnosis not present

## 2023-02-21 DIAGNOSIS — K449 Diaphragmatic hernia without obstruction or gangrene: Secondary | ICD-10-CM | POA: Diagnosis not present

## 2023-02-21 DIAGNOSIS — M48061 Spinal stenosis, lumbar region without neurogenic claudication: Secondary | ICD-10-CM | POA: Diagnosis not present

## 2023-02-21 DIAGNOSIS — G20C Parkinsonism, unspecified: Secondary | ICD-10-CM | POA: Diagnosis not present

## 2023-02-21 DIAGNOSIS — K6389 Other specified diseases of intestine: Secondary | ICD-10-CM | POA: Diagnosis not present

## 2023-02-21 DIAGNOSIS — F32A Depression, unspecified: Secondary | ICD-10-CM | POA: Diagnosis not present

## 2023-02-21 DIAGNOSIS — Z993 Dependence on wheelchair: Secondary | ICD-10-CM | POA: Diagnosis not present

## 2023-02-21 DIAGNOSIS — Z9849 Cataract extraction status, unspecified eye: Secondary | ICD-10-CM | POA: Diagnosis not present

## 2023-02-21 DIAGNOSIS — I480 Paroxysmal atrial fibrillation: Secondary | ICD-10-CM | POA: Diagnosis not present

## 2023-02-21 DIAGNOSIS — I701 Atherosclerosis of renal artery: Secondary | ICD-10-CM | POA: Diagnosis not present

## 2023-02-21 DIAGNOSIS — J90 Pleural effusion, not elsewhere classified: Secondary | ICD-10-CM | POA: Diagnosis not present

## 2023-02-21 DIAGNOSIS — E785 Hyperlipidemia, unspecified: Secondary | ICD-10-CM | POA: Diagnosis not present

## 2023-02-21 DIAGNOSIS — G2581 Restless legs syndrome: Secondary | ICD-10-CM | POA: Diagnosis not present

## 2023-02-21 DIAGNOSIS — Z9049 Acquired absence of other specified parts of digestive tract: Secondary | ICD-10-CM | POA: Diagnosis not present

## 2023-02-21 DIAGNOSIS — F0283 Dementia in other diseases classified elsewhere, unspecified severity, with mood disturbance: Secondary | ICD-10-CM | POA: Diagnosis not present

## 2023-02-21 DIAGNOSIS — Z9181 History of falling: Secondary | ICD-10-CM | POA: Diagnosis not present

## 2023-02-21 DIAGNOSIS — I5032 Chronic diastolic (congestive) heart failure: Secondary | ICD-10-CM | POA: Diagnosis not present

## 2023-02-21 DIAGNOSIS — K219 Gastro-esophageal reflux disease without esophagitis: Secondary | ICD-10-CM | POA: Diagnosis not present

## 2023-02-21 DIAGNOSIS — Z87891 Personal history of nicotine dependence: Secondary | ICD-10-CM | POA: Diagnosis not present

## 2023-02-21 DIAGNOSIS — I739 Peripheral vascular disease, unspecified: Secondary | ICD-10-CM | POA: Diagnosis not present

## 2023-02-21 DIAGNOSIS — J449 Chronic obstructive pulmonary disease, unspecified: Secondary | ICD-10-CM | POA: Diagnosis not present

## 2023-02-21 DIAGNOSIS — U071 COVID-19: Secondary | ICD-10-CM | POA: Diagnosis not present

## 2023-02-21 DIAGNOSIS — D509 Iron deficiency anemia, unspecified: Secondary | ICD-10-CM | POA: Diagnosis not present

## 2023-02-21 DIAGNOSIS — I110 Hypertensive heart disease with heart failure: Secondary | ICD-10-CM | POA: Diagnosis not present

## 2023-02-24 ENCOUNTER — Encounter (HOSPITAL_COMMUNITY): Payer: Self-pay | Admitting: Anesthesiology

## 2023-02-24 ENCOUNTER — Emergency Department (HOSPITAL_COMMUNITY): Payer: Medicare Other

## 2023-02-24 ENCOUNTER — Inpatient Hospital Stay (HOSPITAL_COMMUNITY)
Admission: EM | Admit: 2023-02-24 | Discharge: 2023-03-03 | DRG: 951 | Disposition: E | Payer: Medicare Other | Source: Skilled Nursing Facility | Attending: Family Medicine | Admitting: Family Medicine

## 2023-02-24 ENCOUNTER — Encounter (HOSPITAL_COMMUNITY): Admission: EM | Disposition: E | Payer: Self-pay | Source: Skilled Nursing Facility | Attending: Family Medicine

## 2023-02-24 DIAGNOSIS — M1990 Unspecified osteoarthritis, unspecified site: Secondary | ICD-10-CM | POA: Diagnosis present

## 2023-02-24 DIAGNOSIS — Z8616 Personal history of COVID-19: Secondary | ICD-10-CM | POA: Diagnosis not present

## 2023-02-24 DIAGNOSIS — R29810 Facial weakness: Secondary | ICD-10-CM | POA: Diagnosis present

## 2023-02-24 DIAGNOSIS — G2581 Restless legs syndrome: Secondary | ICD-10-CM | POA: Diagnosis present

## 2023-02-24 DIAGNOSIS — I639 Cerebral infarction, unspecified: Secondary | ICD-10-CM | POA: Diagnosis not present

## 2023-02-24 DIAGNOSIS — I110 Hypertensive heart disease with heart failure: Secondary | ICD-10-CM | POA: Diagnosis present

## 2023-02-24 DIAGNOSIS — J449 Chronic obstructive pulmonary disease, unspecified: Secondary | ICD-10-CM | POA: Diagnosis present

## 2023-02-24 DIAGNOSIS — Z66 Do not resuscitate: Secondary | ICD-10-CM | POA: Diagnosis present

## 2023-02-24 DIAGNOSIS — I739 Peripheral vascular disease, unspecified: Secondary | ICD-10-CM | POA: Diagnosis present

## 2023-02-24 DIAGNOSIS — R414 Neurologic neglect syndrome: Secondary | ICD-10-CM | POA: Diagnosis present

## 2023-02-24 DIAGNOSIS — M810 Age-related osteoporosis without current pathological fracture: Secondary | ICD-10-CM | POA: Diagnosis present

## 2023-02-24 DIAGNOSIS — Z823 Family history of stroke: Secondary | ICD-10-CM

## 2023-02-24 DIAGNOSIS — Z9049 Acquired absence of other specified parts of digestive tract: Secondary | ICD-10-CM

## 2023-02-24 DIAGNOSIS — I63412 Cerebral infarction due to embolism of left middle cerebral artery: Secondary | ICD-10-CM | POA: Diagnosis present

## 2023-02-24 DIAGNOSIS — G8191 Hemiplegia, unspecified affecting right dominant side: Secondary | ICD-10-CM | POA: Diagnosis present

## 2023-02-24 DIAGNOSIS — K5900 Constipation, unspecified: Secondary | ICD-10-CM | POA: Diagnosis present

## 2023-02-24 DIAGNOSIS — Z87891 Personal history of nicotine dependence: Secondary | ICD-10-CM

## 2023-02-24 DIAGNOSIS — R0902 Hypoxemia: Secondary | ICD-10-CM | POA: Diagnosis not present

## 2023-02-24 DIAGNOSIS — M545 Low back pain, unspecified: Secondary | ICD-10-CM | POA: Diagnosis present

## 2023-02-24 DIAGNOSIS — R5383 Other fatigue: Secondary | ICD-10-CM | POA: Diagnosis not present

## 2023-02-24 DIAGNOSIS — R29818 Other symptoms and signs involving the nervous system: Secondary | ICD-10-CM | POA: Diagnosis not present

## 2023-02-24 DIAGNOSIS — I6523 Occlusion and stenosis of bilateral carotid arteries: Secondary | ICD-10-CM | POA: Diagnosis not present

## 2023-02-24 DIAGNOSIS — D649 Anemia, unspecified: Secondary | ICD-10-CM | POA: Diagnosis present

## 2023-02-24 DIAGNOSIS — Z515 Encounter for palliative care: Principal | ICD-10-CM

## 2023-02-24 DIAGNOSIS — R509 Fever, unspecified: Secondary | ICD-10-CM | POA: Diagnosis not present

## 2023-02-24 DIAGNOSIS — I4891 Unspecified atrial fibrillation: Secondary | ICD-10-CM | POA: Diagnosis not present

## 2023-02-24 DIAGNOSIS — I5032 Chronic diastolic (congestive) heart failure: Secondary | ICD-10-CM | POA: Diagnosis present

## 2023-02-24 DIAGNOSIS — I63312 Cerebral infarction due to thrombosis of left middle cerebral artery: Secondary | ICD-10-CM | POA: Diagnosis not present

## 2023-02-24 DIAGNOSIS — K219 Gastro-esophageal reflux disease without esophagitis: Secondary | ICD-10-CM | POA: Diagnosis present

## 2023-02-24 DIAGNOSIS — I959 Hypotension, unspecified: Secondary | ICD-10-CM | POA: Diagnosis not present

## 2023-02-24 DIAGNOSIS — E785 Hyperlipidemia, unspecified: Secondary | ICD-10-CM | POA: Diagnosis not present

## 2023-02-24 DIAGNOSIS — R079 Chest pain, unspecified: Secondary | ICD-10-CM | POA: Diagnosis not present

## 2023-02-24 DIAGNOSIS — R29731 NIHSS score 31: Secondary | ICD-10-CM | POA: Diagnosis present

## 2023-02-24 DIAGNOSIS — H518 Other specified disorders of binocular movement: Secondary | ICD-10-CM | POA: Diagnosis present

## 2023-02-24 DIAGNOSIS — R402411 Glasgow coma scale score 13-15, in the field [EMT or ambulance]: Secondary | ICD-10-CM | POA: Diagnosis not present

## 2023-02-24 DIAGNOSIS — G4700 Insomnia, unspecified: Secondary | ICD-10-CM | POA: Diagnosis present

## 2023-02-24 DIAGNOSIS — Z8679 Personal history of other diseases of the circulatory system: Secondary | ICD-10-CM

## 2023-02-24 DIAGNOSIS — Z79899 Other long term (current) drug therapy: Secondary | ICD-10-CM

## 2023-02-24 DIAGNOSIS — Z803 Family history of malignant neoplasm of breast: Secondary | ICD-10-CM

## 2023-02-24 DIAGNOSIS — J90 Pleural effusion, not elsewhere classified: Secondary | ICD-10-CM | POA: Diagnosis not present

## 2023-02-24 DIAGNOSIS — Z90710 Acquired absence of both cervix and uterus: Secondary | ICD-10-CM

## 2023-02-24 DIAGNOSIS — Z8249 Family history of ischemic heart disease and other diseases of the circulatory system: Secondary | ICD-10-CM

## 2023-02-24 DIAGNOSIS — R404 Transient alteration of awareness: Secondary | ICD-10-CM | POA: Diagnosis not present

## 2023-02-24 DIAGNOSIS — G8929 Other chronic pain: Secondary | ICD-10-CM | POA: Diagnosis not present

## 2023-02-24 DIAGNOSIS — U071 COVID-19: Secondary | ICD-10-CM | POA: Diagnosis not present

## 2023-02-24 DIAGNOSIS — I480 Paroxysmal atrial fibrillation: Secondary | ICD-10-CM | POA: Diagnosis not present

## 2023-02-24 DIAGNOSIS — R4182 Altered mental status, unspecified: Secondary | ICD-10-CM | POA: Diagnosis not present

## 2023-02-24 DIAGNOSIS — R4701 Aphasia: Secondary | ICD-10-CM | POA: Diagnosis present

## 2023-02-24 DIAGNOSIS — Z7401 Bed confinement status: Secondary | ICD-10-CM

## 2023-02-24 LAB — COMPREHENSIVE METABOLIC PANEL
ALT: 39 U/L (ref 0–44)
AST: 43 U/L — ABNORMAL HIGH (ref 15–41)
Albumin: 2.8 g/dL — ABNORMAL LOW (ref 3.5–5.0)
Alkaline Phosphatase: 79 U/L (ref 38–126)
Anion gap: 18 — ABNORMAL HIGH (ref 5–15)
BUN: 25 mg/dL — ABNORMAL HIGH (ref 8–23)
CO2: 21 mmol/L — ABNORMAL LOW (ref 22–32)
Calcium: 8.6 mg/dL — ABNORMAL LOW (ref 8.9–10.3)
Chloride: 102 mmol/L (ref 98–111)
Creatinine, Ser: 1.1 mg/dL — ABNORMAL HIGH (ref 0.44–1.00)
GFR, Estimated: 48 mL/min — ABNORMAL LOW (ref >60)
Glucose, Bld: 106 mg/dL — ABNORMAL HIGH (ref 70–99)
Potassium: 4.2 mmol/L (ref 3.5–5.1)
Sodium: 141 mmol/L (ref 135–145)
Total Bilirubin: 0.9 mg/dL (ref 0.3–1.2)
Total Protein: 6.8 g/dL (ref 6.5–8.1)

## 2023-02-24 LAB — CBC
HCT: 30.6 % — ABNORMAL LOW (ref 36.0–46.0)
Hemoglobin: 9.8 g/dL — ABNORMAL LOW (ref 12.0–15.0)
MCH: 28.5 pg (ref 26.0–34.0)
MCHC: 32 g/dL (ref 30.0–36.0)
MCV: 89 fL (ref 80.0–100.0)
Platelets: 343 10*3/uL (ref 150–400)
RBC: 3.44 MIL/uL — ABNORMAL LOW (ref 3.87–5.11)
RDW: 14.7 % (ref 11.5–15.5)
WBC: 9.5 10*3/uL (ref 4.0–10.5)
nRBC: 0 % (ref 0.0–0.2)

## 2023-02-24 LAB — DIFFERENTIAL
Abs Immature Granulocytes: 0.02 10*3/uL (ref 0.00–0.07)
Basophils Absolute: 0.1 10*3/uL (ref 0.0–0.1)
Basophils Relative: 1 %
Eosinophils Absolute: 0.4 10*3/uL (ref 0.0–0.5)
Eosinophils Relative: 4 %
Immature Granulocytes: 0 %
Lymphocytes Relative: 14 %
Lymphs Abs: 1.4 10*3/uL (ref 0.7–4.0)
Monocytes Absolute: 0.8 10*3/uL (ref 0.1–1.0)
Monocytes Relative: 8 %
Neutro Abs: 6.9 10*3/uL (ref 1.7–7.7)
Neutrophils Relative %: 73 %

## 2023-02-24 LAB — I-STAT CHEM 8, ED
BUN: 26 mg/dL — ABNORMAL HIGH (ref 8–23)
Calcium, Ion: 1.03 mmol/L — ABNORMAL LOW (ref 1.15–1.40)
Chloride: 106 mmol/L (ref 98–111)
Creatinine, Ser: 1.1 mg/dL — ABNORMAL HIGH (ref 0.44–1.00)
Glucose, Bld: 104 mg/dL — ABNORMAL HIGH (ref 70–99)
HCT: 29 % — ABNORMAL LOW (ref 36.0–46.0)
Hemoglobin: 9.9 g/dL — ABNORMAL LOW (ref 12.0–15.0)
Potassium: 4.1 mmol/L (ref 3.5–5.1)
Sodium: 140 mmol/L (ref 135–145)
TCO2: 23 mmol/L (ref 22–32)

## 2023-02-24 LAB — CBG MONITORING, ED: Glucose-Capillary: 101 mg/dL — ABNORMAL HIGH (ref 70–99)

## 2023-02-24 LAB — PROTIME-INR
INR: 1.2 (ref 0.8–1.2)
Prothrombin Time: 14.6 seconds (ref 11.4–15.2)

## 2023-02-24 LAB — APTT: aPTT: 29 seconds (ref 24–36)

## 2023-02-24 LAB — ETHANOL: Alcohol, Ethyl (B): <10 mg/dL (ref <10)

## 2023-02-24 LAB — CK: Total CK: 265 U/L — ABNORMAL HIGH (ref 38–234)

## 2023-02-24 SURGERY — IR WITH ANESTHESIA
Anesthesia: General

## 2023-02-24 MED ORDER — BISACODYL 10 MG RE SUPP: 10.0000 mg | Freq: Every day | RECTAL | Status: DC | PRN

## 2023-02-24 MED ORDER — GLYCOPYRROLATE 0.2 MG/ML IJ SOLN
0.4000 mg | INTRAMUSCULAR | Status: DC
  Administered 2023-02-24 – 2023-02-27 (×17): 0.4 mg via INTRAVENOUS
  Filled 2023-02-24 (×17): qty 2

## 2023-02-24 MED ORDER — ACETAMINOPHEN 650 MG RE SUPP: 650.0000 mg | Freq: Four times a day (QID) | RECTAL | Status: DC | PRN

## 2023-02-24 MED ORDER — BIOTENE DRY MOUTH MT LIQD: 15.0000 mL | OROMUCOSAL | Status: DC | PRN

## 2023-02-24 MED ORDER — LORAZEPAM 2 MG/ML IJ SOLN: 1.0000 mg | INTRAMUSCULAR | Status: DC | PRN

## 2023-02-24 MED ORDER — MORPHINE SULFATE (PF) 2 MG/ML IV SOLN
1.0000 mg | INTRAVENOUS | Status: DC | PRN
  Administered 2023-02-26 – 2023-02-27 (×4): 2 mg via INTRAVENOUS
  Filled 2023-02-24 (×4): qty 1

## 2023-02-24 MED ORDER — IOHEXOL 350 MG/ML SOLN
100.0000 mL | Freq: Once | INTRAVENOUS | Status: AC | PRN
  Administered 2023-02-24: 100 mL via INTRAVENOUS

## 2023-02-24 MED ORDER — HALOPERIDOL LACTATE 2 MG/ML PO CONC: 0.5000 mg | ORAL | Status: DC | PRN

## 2023-02-24 MED ORDER — ONDANSETRON 4 MG PO TBDP: 4.0000 mg | ORAL_TABLET | Freq: Four times a day (QID) | ORAL | Status: DC | PRN

## 2023-02-24 MED ORDER — ONDANSETRON HCL 4 MG/2ML IJ SOLN: 4.0000 mg | Freq: Four times a day (QID) | INTRAMUSCULAR | Status: DC | PRN

## 2023-02-24 MED ORDER — LORAZEPAM 2 MG/ML PO CONC: 1.0000 mg | ORAL | Status: DC | PRN

## 2023-02-24 MED ORDER — HALOPERIDOL LACTATE 5 MG/ML IJ SOLN: 0.5000 mg | INTRAMUSCULAR | Status: DC | PRN

## 2023-02-24 MED ORDER — POLYVINYL ALCOHOL 1.4 % OP SOLN: 1.0000 [drp] | Freq: Four times a day (QID) | OPHTHALMIC | Status: DC | PRN

## 2023-02-24 MED ORDER — ACETAMINOPHEN 325 MG PO TABS: 650.0000 mg | ORAL_TABLET | Freq: Four times a day (QID) | ORAL | Status: DC | PRN

## 2023-02-24 NOTE — Hospital Course (Signed)
Cheryl Atkinson is a 89 y.o.female with a history of Afib not on AC, CHF, COPD, GERD who was admitted to the [facility] Medicine Teaching Service at [facility] for large L MCA stroke. Her hospital course is detailed below:  Large L MCA Stroke Presented with left gaze deviation, R facial droop and altered mentation. Initiated code stroke and patient found to have large left MCA territory infarct involving large portions of the left frontal lobe, left parietal lobe, anterior left temporal lobe and the left insular cortex. Neurology consulted and patient not TNK candidate and discussed poor prognosis with family. Family opted for comfort care and in hospital hospice. Started on comfort care regimen and patient appeared peaceful in NAD. Patient pronounced dead on March 01, 2023.  HTN: stop amlodipine RLS: stop sinemet  CHF: stop lasix, irbesartan, Kclor HLD: stop crestor Afib: not on [facility]

## 2023-02-24 NOTE — Consult Note (Signed)
Consultation Note Date: 02/15/2023   Patient Name: Cheryl Atkinson  DOB: [birth year]  MRN: [id number]  Age / Sex: 89 y.o., female  PCP: Marin Olp, MD Referring Physician: Jeanell Sparrow, DO  Reason for Consultation: Terminal Care  HPI/Patient Profile: 89 y.o. female  with past medical history of A-fib, CHF, COPD, GERD, hypertension, insomnia, admitted on 02/10/2023 with altered mental status.   Patient presents from [facility] facility with [facility] of last night.  Facility reported right-sided neglect, aphasia, left gaze preference.  Imaging obtained in the ED showed large left MCA territory infarct, left MCA M1 occlusion, likely due to A-fib off anticoagulation.  She was not a candidate for IV thrombolysis or mechanical thrombectomy.  Family is interested in comfort care per ED physician. PMT has been consulted to assist with end-of-life care.  Clinical Assessment and Goals of Care:  I have reviewed medical records including EPIC notes, labs and imaging, received report from RN, assessed the patient and then met at the bedside with patient's [name] and brother-in-law to discuss diagnosis prognosis, [place], EOL wishes, disposition and options.  I introduced Palliative Medicine as specialized medical care for people living with serious illness. It focuses on providing relief from the symptoms and stress of a serious illness. The goal is to improve quality of life for both the patient and the family.  We discussed a brief life review of the patient and then focused on their current illness.  The natural disease trajectory and expectations at EOL were discussed.  I attempted to elicit values and goals of care important to the patient.    Medical History Review and Understanding:  Patient's family have a good understanding the severity of her illness and that her time is very limited to days at best.  Social  History: Patient has 1 sister and 1 brother.  She has 2 sons, 1 of which lives in [place].  Her brother and sister-in-law are traveling from [place] to come say goodbye.  She was previously married and lost her husband in 2013.  She has resided at [facility] since Wednesday of last week and disliked it very much.  Functional and Nutritional State: At baseline, patient was ambulating with a wheelchair/walker and able to feed and clean herself.  Palliative Symptoms: Excessive secretions  Advance Directives: A detailed discussion regarding advanced directives was had.  MOST form from 2021 was reviewed -patient indicated herself that she preferred for comfort measures.  No HCPOA documentation on file, reportedly is her sister.   Code Status: Concepts specific to code status, artifical feeding and hydration, and rehospitalization were considered and discussed.  DNR/DNI confirmed.  Discussion: Patient and her sister have had several conversations about her end-of-life wishes.  She has always been clear that she would not want her life prolonged in her current situation.  They were able to have a good conversation yesterday after she has had a really hard week adjusting from moving into LTC.  I encouraged patient's sister to inform family to come visit as soon as they can, as her prognosis is very poor and she could pass away at any time.  [name] shares that patient had become increasingly agitated over the past week, which is not like her.  Family confirmed desire for comfort care.  Counseled that patient would no longer receive aggressive medical interventions such as continuous vital signs, lab work, radiology testing, or medications not focused on comfort. All care would focus on how the patient is looking and feeling.  This would include management of any symptoms that may cause discomfort, pain, shortness of breath, cough, nausea, agitation, anxiety, and/or secretions etc. Symptoms would be managed  with medications and other non-pharmacological interventions such as spiritual support if requested, repositioning, music therapy, or therapeutic listening. Family verbalized understanding and appreciation.  I shared my recommendations for removal of nasal cannula, as oxygen supplementation does not provide comfort lungs the dying process.  We also discussed the possibility of transfer to hospice facility if patient remains stable after monitoring and family is agreeable.  Patient's son will be flying in from [place] and her brother will be driving up from [place] tomorrow as well.    The difference between aggressive medical intervention and comfort care was considered in light of the patient's goals of care. Hospice and Palliative Care services outpatient were explained and offered.   Discussed the importance of continued conversation with family and the medical providers regarding overall plan of care and treatment options, ensuring decisions are within the context of the patient's values and GOCs.   Questions and concerns were addressed.  Hard Choices booklet left for review.  Gone from my sight booklet provided for review.  The family was encouraged to call with questions or concerns.  PMT will continue to support holistically.   SUMMARY OF RECOMMENDATIONS   -DNR/DNI -Placed orders for comfort focused care Morphine PRN for pain/air hunger/comfort Robinul 0.4 mg IV every 4 hours for excessive secretions Ativan PRN for agitation/anxiety Zofran PRN for nausea Liquifilm tears PRN for dry eyes Haldol PRN for agitation/anxiety Unrestricted visitations in the setting of EOL (per policy) Family is agreeable with discontinuing nasal cannula -Psychosocial and emotional support provided -Spiritual care consult declined at this time, patient's sister will contact her pastor -Patient's family would consider transfer to residential hospice facility if she remains stable -PMT will continue to follow  and support   Prognosis:  Hours - Days  Discharge Planning: Anticipated Hospital Death      Primary Diagnoses: Present on Admission: **None**   Physical Exam Vitals and nursing note reviewed.  Constitutional:      General: She is not in acute distress.    Appearance: She is ill-appearing.     Interventions: Nasal cannula in place.     Comments: 1.5L  Cardiovascular:     Rate and Rhythm: Normal rate. Rhythm irregular.  Pulmonary:     Effort: Pulmonary effort is normal. No respiratory distress.  Skin:    General: Skin is warm and dry.  Neurological:     Mental Status: She is unresponsive.    Vital Signs: BP (!) 145/66   Pulse 62   Temp 97.7 F (36.5 C) (Rectal)   Resp 16   LMP  (LMP Unknown)   SpO2 98%        SpO2: SpO2: 98 % O2 Device:SpO2: 98 % O2 Flow Rate: .    Palliative Assessment/Data: 10%     MDM: High    Heylee Tant Johnnette Litter, PA-C  Palliative Medicine Team Team phone # [phone]  Thank you for allowing the Palliative Medicine Team to assist in the care of this patient. Please utilize secure chat with additional questions, if there is no response within 30 minutes please call the above phone number.  Palliative Medicine Team providers are available by phone from 7am to 7pm daily and can be reached through the team cell phone.  Should this patient require assistance outside of these hours, please call the patient's attending physician.

## 2023-02-24 NOTE — Assessment & Plan Note (Addendum)
S/p large L MCA stroke, LKW 2130, out of window for thrombolysis and thrombectomy. Seen by neurology who spoke with family regarding limited prognosis and likelihood of worsening cerebral edema. Palliative care consulted and following. Family preference for palliative care in hospital, considering hospice facility placement at later day if still appropriate. -Palliative care following, appreciate assistance  -discussed with palliative care, patient can have 6 visitors at once.  -comfort care measures: tylenol, biotene (dry mouth), dulcolax for moderate constipation, robinul 0.4mg  IV q4h, haldol 0.5mg  SL q4h PRN for agitation or delirium, ativan 1mg  PRN for agitation or delirium, morphine for severe pain or SOB, zofran for nausea, liquifilm for dry eyes

## 2023-02-24 NOTE — Code Documentation (Signed)
Ms. Gyselle Renschler is an 89 yr old female arriving to [facility] via EMS on 02/28/2023. Pt is from assisted living facility where she was last known well yesterday at 2130, and was found altered this morning. She has a PMH of AF, CHF COPD GERD. She is not on any known thinners.     Code stroke activated in triage. Pt to ED room 24, where she was joined by stroke team. CBG, Blood work has been collected. Airway has been cleared by EDP. Pt to CT with team. NIHSS 31 (Please see documentation for code stroke times and NIHSS details). The following imaging was obtained: CT, CTA/P. Per neurologist, CT head is negative for hemorrhage. CTA and perfusion then obtained. Per neurologist, pt has a Left MCA M1 occlusion. However, Perfusion shows a large core and ASPECTS is 2. Pt therefore not eligible for mechanical thrombectomy.  (Pt OOW for thrombolytic).    Pt returned to ED room 34 where her workup will continue. Neurologist discussing pt's wishes with her Sister and other family members at this time. Unless pt is to be made Comfort Care, she will need q 2 hr VS and NIHSS. Bedside handoff with Paden RN complete.

## 2023-02-24 NOTE — Progress Notes (Signed)
Family medicine teaching service will be admitting this patient. Our pager information can be located in the physician sticky notes, treatment team sticky notes, and the headers of all our official daily progress notes.   FAMILY MEDICINE TEACHING SERVICE Patient - Please contact intern pager [phone] or text page via website [email or web address] (login: [email or web address]) for questions regarding care. DO NOT page listed attending provider unless there is no answer from the number above.   Rut Betterton, MD PGY-3, [place] [facility] Service pager [phone]   

## 2023-02-24 NOTE — ED Provider Notes (Signed)
[facility] Provider Note  CSN: [id number] Arrival date & time: 02/06/2023 1208  Chief Complaint(s) Altered Mental Status  HPI Cheryl Atkinson is a 89 y.o. female with past medical history as below, significant for COPD, GERD, HTN, insomnia who presents to the ED with complaint of AMS.  LKN unclear currently, last night around 9PM per family. Nursing home unclear timing, reported that she was "normal" during rounds last night but this morning they went to check on her and she was altered, unable to get out of bed, gurgling noises.  Right sided neglect, aphasia, altered on arrival  Level 5 caveat, ams  D/w POA Brenda, confirmed DNR/DNI status, reports she typically is able to ambulate w/ walker but can feed/clean herself, normally can move her right side. Spoke to her around 8-9 pm last night. She thinks she went to breakfast this morning but family is unclear.  Spoke with other sister [name] who reports she was acting normally around 3-4pm yesterday.   Unclear LKN, could be around 8-9 pm last night.   Past Medical History Past Medical History:  Diagnosis Date   Acute cholecystitis 12/29/2019   Anemia    Arthritis    "shoulders" (05/04/2015)   Cellulitis of right lower extremity 11/27/2013   CHF (congestive heart failure) (HCC)    Chronic lower back pain    Constipation    COPD (chronic obstructive pulmonary disease) (HCC)    GERD (gastroesophageal reflux disease)    History of hiatal hernia    HLD (hyperlipidemia)    Hypertension    Insomnia    Osteoporosis    Peripheral arterial disease ([place])    Restless leg syndrome    Thyroid disease    Patient Active Problem List   Diagnosis Date Noted   Stroke ([place]) 02/08/2023   COVID-19 virus infection 08/29/2021   Renal lesion 08/29/2021   B12 deficiency 02/27/2021   Pleural effusion due to CHF (congestive heart failure) ([place]) 05/26/2020   S/P laparoscopic cholecystectomy 04/04/2020    Heart failure with preserved ejection fraction ([place]) 03/01/2020   Insomnia 03/01/2020   Bacteremia 01/28/2020   Atrial fibrillation ([place]) 01/25/2020   Abdominal aortic ectasia ([place]) 12/29/2019   Major depression 03/25/2018   Cat bite of right hand 12/21/2016   BPPV (benign paroxysmal positional vertigo) 07/12/2016   Memory loss 04/11/2016   Mallet toe of right foot 10/20/2015   Renal artery stenosis ([place]) 09/27/2015   Hyperlipidemia 06/30/2015   Claudication ([place]) 05/04/2015   Atherosclerotic PVD with intermittent claudication ([place]) 04/26/2015   Tinnitus 12/31/2014   Former smoker 09/29/2014   DNR (do not resuscitate) 09/29/2014   Hyperglycemia 07/19/2014   Multinodular goiter 08/30/2013   Spinal stenosis of lumbar region at multiple levels 09/29/2012   HIP PAIN, BILATERAL 07/16/2010   COPD (chronic obstructive pulmonary disease) ([place]) 09/20/2009   CONSTIPATION, CHRONIC 09/20/2009   Iron deficiency anemia 11/09/2007   History of UTI 11/09/2007   RESTLESS LEG SYNDROME 09/25/2007   Essential hypertension 09/25/2007   GERD 09/25/2007   LOW BACK PAIN 09/25/2007   Osteoporosis 09/25/2007   Home Medication(s) Prior to Admission medications   Medication Sig Start Date End Date Taking? Authorizing Provider  acetaminophen (TYLENOL) 325 MG tablet Take 650 mg by mouth in the morning, at noon, and at bedtime.   Yes [provider]  amLODipine (NORVASC) 5 MG tablet TAKE 1 TABLET(5 MG) BY MOUTH DAILY Patient taking differently: Take 5 mg by mouth daily. 12/30/22  Yes Lorretta Harp, MD  ferrous sulfate 325 (65 FE) MG tablet Take 325 mg by mouth 2 (two) times a week. Tuesday's and Friday's   Yes [provider]  furosemide (LASIX) 40 MG tablet Take 1 tablet (40 mg total) by mouth daily. 01/09/23  Yes Marin Olp, MD  irbesartan (AVAPRO) 300 MG tablet Take 1 tablet (300 mg total) by mouth daily. 06/20/22  Yes Marin Olp, MD  mirtazapine (REMERON) 7.5 MG tablet  Take 1 tablet (7.5 mg total) by mouth at bedtime. 10/01/22  Yes Marin Olp, MD  potassium chloride (KLOR-CON M) 10 MEQ tablet Take 1 tablet (10 mEq total) by mouth daily as needed (take with each dose of lasix.). Needs to be taken  With each dose of lasix Patient taking differently: Take 10 mEq by mouth daily. 01/09/23  Yes Marin Olp, MD  ramelteon (ROZEREM) 8 MG tablet Take 8 mg by mouth at bedtime.   Yes [provider]  rosuvastatin (CRESTOR) 40 MG tablet TAKE 1 TABLET DAILY Patient taking differently: Take 40 mg by mouth daily. 11/08/22  Yes Marin Olp, MD  rotigotine (NEUPRO) 2 MG/24HR Place 1 patch onto the skin daily. 10/18/22  Yes Patel, Donika K, DO  carbidopa-levodopa (SINEMET IR) 25-100 MG tablet TAKE 1 TABLET BY MOUTH DAILY AS NEEDED FOR WORSENING RLS Patient not taking: Reported on 02/11/2023 10/18/22   Alda Berthold, DO                                                                                                                                    Past Surgical History Past Surgical History:  Procedure Laterality Date   ABDOMINAL AORTAGRAM  05/04/2015   Procedure: Abdominal Aortagram;  Surgeon: Lorretta Harp, MD;  Location: [facility];  Service: Cardiovascular;;   APPENDECTOMY     CATARACT EXTRACTION W/ INTRAOCULAR LENS  IMPLANT, BILATERAL Bilateral    CHOLECYSTECTOMY N/A 04/04/2020   Procedure: LAPAROSCOPIC CHOLECYSTECTOMY;  Surgeon: Georganna Skeans, MD;  Location: [facility];  Service: General;  Laterality: N/A;   I & D EXTREMITY Right 12/22/2016   Procedure: IRRIGATION AND DEBRIDEMENT EXTREMITY;  Surgeon: Milly Jakob, MD;  Location: [facility];  Service: Orthopedics;  Laterality: Right;   IR EXCHANGE BILIARY DRAIN  03/13/2020   IR PATIENT EVAL TECH 0-60 MINS  12/30/2019   IR PERC CHOLECYSTOSTOMY  01/27/2020   IR RADIOLOGIST EVAL & MGMT  05/16/2020   PERIPHERAL VASCULAR CATHETERIZATION N/A 05/04/2015   Procedure: Lower Extremity Angiography;  Surgeon:  Lorretta Harp, MD;  Location: [facility];  Service: Cardiovascular;  Laterality: N/A;   PERIPHERAL VASCULAR CATHETERIZATION  05/04/2015   Procedure: Peripheral Vascular Intervention;  Surgeon: Lorretta Harp, MD;  Location: [facility];  Service: Cardiovascular;;  RCIA - 7x22 ICAST   PERIPHERAL VASCULAR CATHETERIZATION Right 09/04/2015   Procedure: Peripheral Vascular Atherectomy;  Surgeon: Roderic Palau  Adora Fridge, MD;  Location: [facility];  Service: Cardiovascular;  Laterality: Right;  SFA   sfa Right 09/04/2015   de balloon   THYROID SURGERY Right ?2013   "had goiter taken off my neck"   TONSILLECTOMY     VAGINAL HYSTERECTOMY     Family History Family History  Problem Relation Age of Onset   Heart disease Mother    Heart disease Father    Heart attack Father    Cancer Sister        Breast Cancer   Atrial fibrillation Sister    Stroke Maternal Grandmother    Cancer Paternal Grandmother    Coronary artery disease Other     Social History Social History   Tobacco Use   Smoking status: Former    Packs/day: 0.50    Years: 60.00    Additional pack years: 0.00    Total pack years: 30.00    Types: Cigarettes    Quit date: 04/02/2015    Years since quitting: 7.9   Smokeless tobacco: Never  Vaping Use   Vaping Use: Never used  Substance Use Topics   Alcohol use: No   Drug use: No   Allergies Codeine  Review of Systems Review of Systems  Unable to perform ROS: Mental status change    Physical Exam Vital Signs  I have reviewed the triage vital signs BP (!) 155/68   Pulse 76   Temp (!) 100.6 F (38.1 C) (Axillary)   Resp 19   LMP  (LMP Unknown)   SpO2 93%  Physical Exam Vitals and nursing note reviewed. Exam conducted with a chaperone present.  Constitutional:      General: She is in acute distress.     Appearance: She is ill-appearing.  HENT:     Head: Normocephalic and atraumatic.     Right Ear: External ear normal.     Left Ear: External  ear normal.     Nose: Nose normal.     Mouth/Throat:     Mouth: Mucous membranes are dry.  Eyes:     General: No scleral icterus.       Right eye: No discharge.        Left eye: No discharge.     Pupils: Pupils are equal, round, and reactive to light.  Cardiovascular:     Rate and Rhythm: Normal rate and regular rhythm.     Pulses: Normal pulses.     Heart sounds: Normal heart sounds. No murmur heard. Pulmonary:     Breath sounds: Decreased breath sounds present.     Comments: Gurgling  Coarse breath sounds b/l Reduced effort  Abdominal:     General: Abdomen is flat.     Palpations: Abdomen is soft.     Tenderness: There is no abdominal tenderness. There is no guarding.  Musculoskeletal:     Cervical back: Normal range of motion.  Neurological:     Mental Status: She is lethargic.     GCS: GCS eye subscore is 2. GCS verbal subscore is 1. GCS motor subscore is 4.     Cranial Nerves: Facial asymmetry present.     Sensory: Sensory deficit present.     Motor: Weakness present.     Comments: Facial droop noted to left Strength 0/5 RUE RLE Will flex bicep to noxious stimulus to right hand but very limited movement to right UE Unable to test coordination 2/2 poor compliance with testing, RUE  Gait not tested 2/2 pt  safety      ED Results and Treatments Labs (all labs ordered are listed, but only abnormal results are displayed) Labs Reviewed  CBC - Abnormal; Notable for the following components:      Result Value   RBC 3.44 (*)    Hemoglobin 9.8 (*)    HCT 30.6 (*)    All other components within normal limits  COMPREHENSIVE METABOLIC PANEL - Abnormal; Notable for the following components:   CO2 21 (*)    Glucose, Bld 106 (*)    BUN 25 (*)    Creatinine, Ser 1.10 (*)    Calcium 8.6 (*)    Albumin 2.8 (*)    AST 43 (*)    GFR, Estimated 48 (*)    Anion gap 18 (*)    All other components within normal limits  CK - Abnormal; Notable for the following components:    Total CK 265 (*)    All other components within normal limits  I-STAT CHEM 8, ED - Abnormal; Notable for the following components:   BUN 26 (*)    Creatinine, Ser 1.10 (*)    Glucose, Bld 104 (*)    Calcium, Ion 1.03 (*)    Hemoglobin 9.9 (*)    HCT 29.0 (*)    All other components within normal limits  CBG MONITORING, ED - Abnormal; Notable for the following components:   Glucose-Capillary 101 (*)    All other components within normal limits  ETHANOL  PROTIME-INR  APTT  DIFFERENTIAL                                                                                                                          Radiology DG Chest Portable 1 View  Result Date: 02/06/2023 CLINICAL DATA:  Altered mental status EXAM: PORTABLE CHEST 1 VIEW COMPARISON:  Previous studies including the examination of 08/11/2022 FINDINGS: Transverse diameter of heart is increased. There are no signs of pulmonary edema. Left hemidiaphragm is elevated. There is crowding of markings in left lower lung field. Rest of the lung fields are clear. Left lateral CP angle is indistinct. There is no pneumothorax. There is possible old bone infarct in proximal right humerus. IMPRESSION: Cardiomegaly. There are no signs of pulmonary edema. Increased markings in left lower lung field may suggest crowding of bronchovascular structures due to elevation of left hemidiaphragm or atelectasis/pneumonia. Electronically Signed   By: Elmer Picker M.D.   On: 02/20/2023 13:44   CT ANGIO HEAD NECK W WO CM W PERF (CODE STROKE)  Result Date: 02/06/2023 CLINICAL DATA:  Neuro deficit, acute, stroke suspected. Left-sided vision issue with right-sided weakness. EXAM: CT ANGIOGRAPHY HEAD AND NECK CT PERFUSION BRAIN TECHNIQUE: Multidetector CT imaging of the head and neck was performed using the standard protocol during bolus administration of intravenous contrast. Multiplanar CT image reconstructions and MIPs were obtained to evaluate the vascular  anatomy. Carotid stenosis measurements (when applicable) are obtained utilizing NASCET criteria, using the distal internal carotid  diameter as the denominator. Multiphase CT imaging of the brain was performed following IV bolus contrast injection. Subsequent parametric perfusion maps were calculated using RAPID software. RADIATION DOSE REDUCTION: This exam was performed according to the departmental dose-optimization program which includes automated exposure control, adjustment of the mA and/or kV according to patient size and/or use of iterative reconstruction technique. CONTRAST:  158mL OMNIPAQUE IOHEXOL 350 MG/ML SOLN COMPARISON:  Head CT 02/14/2023. FINDINGS: CTA NECK FINDINGS Aortic arch: Three-vessel arch configuration. Atherosclerotic calcifications of the aortic arch and arch vessel origins. Arch vessel origins are patent. Right carotid system: No evidence of dissection, stenosis (50% or greater) or occlusion. Calcified plaque along the right carotid bulb and proximal right cervical ICA. Left carotid system: No evidence of dissection, stenosis (50% or greater) or occlusion. Mild calcified plaque along the left carotid bulb and proximal left cervical ICA. Vertebral arteries: Tortuous V1 segments bilaterally. Otherwise, patent from the origin to the confluence with the basilar without stenosis or dissection. Skeleton: No suspicious bone lesions. Other neck: Unremarkable. Upper chest: Unremarkable. Review of the MIP images confirms the above findings CTA HEAD FINDINGS Anterior circulation: Calcified plaque along the carotid siphons without hemodynamically significant stenosis. Mild ectasia of the cavernous and clinoid segments bilaterally. Occlusion of the distal M1 segment of the left MCA. The right MCA and ACAs are patent proximally without aneurysm or stenosis. Diminished distal branches in the left MCA and ACA territories. Posterior circulation: Normal basilar artery. The SCAs, AICAs and PICAs are patent  proximally. The PCAs are patent proximally without stenosis or aneurysm. Distal branches are symmetric. Venous sinuses: As permitted by contrast timing, patent. Anatomic variants: None. Review of the MIP images confirms the above findings CT Brain Perfusion Findings: ASPECTS: 2 CBF (<30%) Volume: 137mL Perfusion (Tmax>6.0s) volume: 145mL Mismatch Volume: 50mL, ratio 1.1 Infarction Location:Left MCA territory. IMPRESSION: 1. Occlusion of the distal M1 segment of the left MCA. 2. No hemodynamically significant stenosis in the neck. 3. Perfusion imaging demonstrates 153 mL of core infarct in the left MCA territory with 19 mL of penumbra. 4. Aortic Atherosclerosis (ICD10-I70.0). Electronically Signed   By: Emmit Alexanders M.D.   On: 02/06/2023 13:21   CT HEAD CODE STROKE WO CONTRAST  Result Date: 02/23/2023 CLINICAL DATA:  Code stroke.  Stroke suspected EXAM: CT HEAD WITHOUT CONTRAST TECHNIQUE: Contiguous axial images were obtained from the base of the skull through the vertex without intravenous contrast. RADIATION DOSE REDUCTION: This exam was performed according to the departmental dose-optimization program which includes automated exposure control, adjustment of the mA and/or kV according to patient size and/or use of iterative reconstruction technique. COMPARISON:  None Available. FINDINGS: Brain: Large left MCA territory infarct involving large portions of the left frontal lobe, left parietal lobe, anterior left temporal lobe and the left insular cortex. There is likely also an infarct in the left insular cortex. Vascular: Hyperdense left MCA Skull: Normal. Negative for fracture or focal lesion. Sinuses/Orbits: No middle ear or mastoid effusion. Mucosal thickening left maxillary and bilateral sphenoid sinuses. Bilateral lens replacement. Orbits are otherwise unremarkable. Other: None. ASPECTS ([place] Stroke Program Early CT Score) - Ganglionic level infarction (caudate, lentiform nuclei, internal capsule,  insula, M1-M3 cortex): 2 - Supraganglionic infarction (M4-M6 cortex): 0 Total score (0-10 with 10 being normal): 2 IMPRESSION: Large left MCA territory infarct involving large portions of the left frontal lobe, left parietal lobe, anterior left temporal lobe and the left insular cortex. Aspects is 2. No hemorrhage. Findings were discussed with Dr. Pearline Cables on  02/12/2023 at 12:56 PM. Electronically Signed   By: Marin Roberts M.D.   On: 02/06/2023 12:57    Pertinent labs & imaging results that were available during my care of the patient were reviewed by me and considered in my medical decision making (see MDM for details).  Medications Ordered in ED Medications  acetaminophen (TYLENOL) tablet 650 mg (has no administration in time range)    Or  acetaminophen (TYLENOL) suppository 650 mg (has no administration in time range)  ondansetron (ZOFRAN-ODT) disintegrating tablet 4 mg (has no administration in time range)    Or  ondansetron (ZOFRAN) injection 4 mg (has no administration in time range)  glycopyrrolate (ROBINUL) injection 0.4 mg (has no administration in time range)  antiseptic oral rinse (BIOTENE) solution 15 mL (has no administration in time range)  polyvinyl alcohol (LIQUIFILM TEARS) 1.4 % ophthalmic solution 1 drop (has no administration in time range)  morphine (PF) 2 MG/ML injection 1-2 mg (has no administration in time range)  LORazepam (ATIVAN) 2 MG/ML concentrated solution 1 mg (has no administration in time range)    Or  LORazepam (ATIVAN) injection 1 mg (has no administration in time range)  haloperidol (HALDOL) 2 MG/ML solution 0.5 mg (has no administration in time range)    Or  haloperidol lactate (HALDOL) injection 0.5 mg (has no administration in time range)  bisacodyl (DULCOLAX) suppository 10 mg (has no administration in time range)  iohexol (OMNIPAQUE) 350 MG/ML injection 100 mL (100 mLs Intravenous Contrast Given 02/21/2023 1258)                                                                                                                                      Procedures Procedures  (including critical care time)  Medical Decision Making / ED Course    Medical Decision Making:    MAEDELLE JUBRAN is a 89 y.o. female with past medical history as below, significant for COPD, GERD, HTN, insomnia who presents to the ED with complaint of AMS.. The complaint involves an extensive differential diagnosis and also carries with it a high risk of complications and morbidity.  Serious etiology was considered. Ddx includes but is not limited to: Differential diagnoses for altered mental status includes but is not exclusive to alcohol, illicit or prescription medications, intracranial pathology such as stroke, intracerebral hemorrhage, fever or infectious causes including sepsis, hypoxemia, uremia, trauma, endocrine related disorders such as diabetes, hypoglycemia, thyroid-related diseases, etc.   Complete initial physical exam performed, notably the patient  was HDS, increased respiratory effort, focal neuro deficit noted on exam, right sided neglect .    Reviewed and confirmed nursing documentation for past medical history, family history, social history.  Vital signs reviewed.   Gurgling respirations on arrival, placed on [place] and pulse ox improved from 91 > 97%. DNR DNI confirmed   Clinical Course as of 02/03/2023 1625  Mon Feb 24, 2023  1405 Patient  with large CVA, MCA infarct, patient is DNR/DNI.  Patient family/DPOA has decided to pursue comfort care.  Will d/w palliative. [SG]  1419 Palliative consulted they will come and see in consult.  Plan to admit to medicine. [SG]  1443 Admitted to FMTS [SG]    Clinical Course User Index [SG] Jeanell Sparrow, DO   Labs reviewed, these are stable  Patient with catastrophic CVA, possibly secondary embolic source associate with her A-fib.  Not a candidate for TNK due to unknown last normal status last known normal outside of TNK  window.  Initially thrombectomy was consideration however not at this time.  Neurology discussed with family, they have opted to pursue comfort care at this time.  Consulted palliative care.  Admitted to family medicine service.  Additional history obtained: -Additional history obtained from family -External records from outside source obtained and reviewed including: Chart review including previous notes, labs, imaging, consultation notes including prior admission, outside hospital records, home medications, prior labs and imaging   Lab Tests: -I ordered, reviewed, and interpreted labs.   The pertinent results include:   Labs Reviewed  CBC - Abnormal; Notable for the following components:      Result Value   RBC 3.44 (*)    Hemoglobin 9.8 (*)    HCT 30.6 (*)    All other components within normal limits  COMPREHENSIVE METABOLIC PANEL - Abnormal; Notable for the following components:   CO2 21 (*)    Glucose, Bld 106 (*)    BUN 25 (*)    Creatinine, Ser 1.10 (*)    Calcium 8.6 (*)    Albumin 2.8 (*)    AST 43 (*)    GFR, Estimated 48 (*)    Anion gap 18 (*)    All other components within normal limits  CK - Abnormal; Notable for the following components:   Total CK 265 (*)    All other components within normal limits  I-STAT CHEM 8, ED - Abnormal; Notable for the following components:   BUN 26 (*)    Creatinine, Ser 1.10 (*)    Glucose, Bld 104 (*)    Calcium, Ion 1.03 (*)    Hemoglobin 9.9 (*)    HCT 29.0 (*)    All other components within normal limits  CBG MONITORING, ED - Abnormal; Notable for the following components:   Glucose-Capillary 101 (*)    All other components within normal limits  ETHANOL  PROTIME-INR  APTT  DIFFERENTIAL    Notable for as above  EKG   EKG Interpretation  Date/Time:  Monday February 24 2023 12:16:38 EDT Ventricular Rate:  75 PR Interval:    QRS Duration: 121 QT Interval:  434 QTC Calculation: 485 R Axis:   -63 Text  Interpretation: Nonspecific IVCD with LAD Artifact in lead(s) I II III aVR aVL aVF V1 V2 V3 V4 V5 V6 Interpretation limited secondary to artifact Confirmed by Wynona Dove ([id number]) on 02/21/2023 4:23:01 PM         Imaging Studies ordered: I ordered imaging studies including CT head, CTA I independently visualized the following imaging with scope of interpretation limited to determining acute life threatening conditions related to emergency care; findings noted above, significant for large MCA infarct I independently visualized and interpreted imaging. I agree with the radiologist interpretation   Medicines ordered and prescription drug management: Meds ordered this encounter  Medications   iohexol (OMNIPAQUE) 350 MG/ML injection 100 mL   OR Linked Order Group  acetaminophen (TYLENOL) tablet 650 mg    acetaminophen (TYLENOL) suppository 650 mg   OR Linked Order Group    ondansetron (ZOFRAN-ODT) disintegrating tablet 4 mg    ondansetron (ZOFRAN) injection 4 mg   glycopyrrolate (ROBINUL) injection 0.4 mg   antiseptic oral rinse (BIOTENE) solution 15 mL   polyvinyl alcohol (LIQUIFILM TEARS) 1.4 % ophthalmic solution 1 drop   morphine (PF) 2 MG/ML injection 1-2 mg   OR Linked Order Group    LORazepam (ATIVAN) 2 MG/ML concentrated solution 1 mg    LORazepam (ATIVAN) injection 1 mg   OR Linked Order Group    haloperidol (HALDOL) 2 MG/ML solution 0.5 mg    haloperidol lactate (HALDOL) injection 0.5 mg   bisacodyl (DULCOLAX) suppository 10 mg    -I have reviewed the patients home medicines and have made adjustments as needed   Consultations Obtained: I requested consultation with the neurology and palliative care,  and discussed lab and imaging findings as well as pertinent plan - they recommend: comfort care   Cardiac Monitoring: The patient was maintained on a cardiac monitor.  I personally viewed and interpreted the cardiac monitored which showed an underlying rhythm of:  SR  Social Determinants of Health:  Diagnosis or treatment significantly limited by social determinants of health: former smoker   Reevaluation: After the interventions noted above, I reevaluated the patient and found that they have stayed the same  Co morbidities that complicate the patient evaluation  Past Medical History:  Diagnosis Date   Acute cholecystitis 12/29/2019   Anemia    Arthritis    "shoulders" (05/04/2015)   Cellulitis of right lower extremity 11/27/2013   CHF (congestive heart failure) (HCC)    Chronic lower back pain    Constipation    COPD (chronic obstructive pulmonary disease) ([place])    GERD (gastroesophageal reflux disease)    History of hiatal hernia    HLD (hyperlipidemia)    Hypertension    Insomnia    Osteoporosis    Peripheral arterial disease ([place])    Restless leg syndrome    Thyroid disease       Dispostion: Disposition decision including need for hospitalization was considered, and patient admitted to the hospital.    Final Clinical Impression(s) / ED Diagnoses Final diagnoses:  Cerebrovascular accident (CVA), unspecified mechanism ([place])  DNR (do not resuscitate)     This chart was dictated using voice recognition software.  Despite best efforts to proofread,  errors can occur which can change the documentation meaning.    Jeanell Sparrow, DO 02/03/2023 1625

## 2023-02-24 NOTE — ED Notes (Signed)
ED TO INPATIENT HANDOFF REPORT  ED Nurse Name and Phone #: Margreta Journey, RN  S Name/Age/Gender Cheryl Atkinson 89 y.o. female Room/Bed: 038C/038C  Code Status   Code Status: DNR  Home/SNF/Other   A&Ox0 Is this baseline? No   Triage Complete: Triage complete  Chief Complaint Stroke [facility]) [I63.9]  Triage Note Pt arrived from [facility]. Staff went to check on pt when she didn't get up for breakfast and they found her laying in bed supine. Per EMS, Pt has GCS of 8, left sided gaze, drooling out left side of mouth, less responsive on right side. Pt's LKW is "some time in the middle of the night."    Allergies Allergies  Allergen Reactions   Codeine     hallucinations    Level of Care/Admitting Diagnosis ED Disposition     ED Disposition  Admit   Condition  --   Comment  Hospital Area: [place] [[id number]]  Level of Care: Progressive [102]  Admit to Progressive based on following criteria: NEUROLOGICAL AND NEUROSURGICAL complex patients with significant risk of instability, who do not meet ICU criteria, yet require close observation or frequent assessment (< / = every 2 - 4 hours) with medical / nursing intervention.  May place patient in observation at [facility] or [facility] if equivalent level of care is available:: No  Covid Evaluation: Asymptomatic - no recent exposure (last 10 days) testing not required  Diagnosis: Stroke [facility]) [id number]  Admitting Physician: Rolanda Lundborg [id number]  Attending Physician: Leeanne Rio [phone]          B Medical/Surgery History Past Medical History:  Diagnosis Date   Acute cholecystitis 12/29/2019   Anemia    Arthritis    "shoulders" (05/04/2015)   Cellulitis of right lower extremity 11/27/2013   CHF (congestive heart failure) (HCC)    Chronic lower back pain    Constipation    COPD (chronic obstructive pulmonary disease) (HCC)    GERD (gastroesophageal reflux disease)    History  of hiatal hernia    HLD (hyperlipidemia)    Hypertension    Insomnia    Osteoporosis    Peripheral arterial disease ([place])    Restless leg syndrome    Thyroid disease    Past Surgical History:  Procedure Laterality Date   ABDOMINAL AORTAGRAM  05/04/2015   Procedure: Abdominal Aortagram;  Surgeon: Lorretta Harp, MD;  Location: [place] [facility];  Service: Cardiovascular;;   APPENDECTOMY     CATARACT EXTRACTION W/ INTRAOCULAR LENS  IMPLANT, BILATERAL Bilateral    CHOLECYSTECTOMY N/A 04/04/2020   Procedure: LAPAROSCOPIC CHOLECYSTECTOMY;  Surgeon: Georganna Skeans, MD;  Location: [place];  Service: General;  Laterality: N/A;   I & D EXTREMITY Right 12/22/2016   Procedure: IRRIGATION AND DEBRIDEMENT EXTREMITY;  Surgeon: Milly Jakob, MD;  Location: [place];  Service: Orthopedics;  Laterality: Right;   IR EXCHANGE BILIARY DRAIN  03/13/2020   IR PATIENT EVAL TECH 0-60 MINS  12/30/2019   IR PERC CHOLECYSTOSTOMY  01/27/2020   IR RADIOLOGIST EVAL & MGMT  05/16/2020   PERIPHERAL VASCULAR CATHETERIZATION N/A 05/04/2015   Procedure: Lower Extremity Angiography;  Surgeon: Lorretta Harp, MD;  Location: [place] [facility];  Service: Cardiovascular;  Laterality: N/A;   PERIPHERAL VASCULAR CATHETERIZATION  05/04/2015   Procedure: Peripheral Vascular Intervention;  Surgeon: Lorretta Harp, MD;  Location: [place] [facility];  Service: Cardiovascular;;  RCIA - 7x22 ICAST   PERIPHERAL VASCULAR CATHETERIZATION Right 09/04/2015  Procedure: Peripheral Vascular Atherectomy;  Surgeon: Lorretta Harp, MD;  Location: [place] [facility];  Service: Cardiovascular;  Laterality: Right;  SFA   sfa Right 09/04/2015   de balloon   THYROID SURGERY Right ?2013   "had goiter taken off my neck"   TONSILLECTOMY     VAGINAL HYSTERECTOMY       A IV Location/Drains/Wounds Patient Lines/Drains/Airways Status     Active Line/Drains/Airways     Name Placement date Placement time Site Days   Peripheral IV 02/23/2023 20 G Left  Antecubital 02/01/2023  1217  Antecubital  less than 1   Peripheral IV 02/18/2023 18 G Right Antecubital 02/23/2023  1251  Antecubital  less than 1   Incision - 4 Ports Abdomen 1: Umbilicus 2: Upper;Mid 3: Right;Upper 4: Right;Lateral 04/04/20  1135  -- 1056   Pressure Injury 05/27/20 Heel Left Unstageable - Full thickness tissue loss in which the base of the injury is covered by slough (yellow, tan, gray, green or brown) and/or eschar (tan, brown or black) in the wound bed. 05/27/20  2215  -- 1003            Intake/Output Last 24 hours No intake or output data in the 24 hours ending 02/18/2023 1540  Labs/Imaging Results for orders placed or performed during the hospital encounter of 02/15/2023 (from the past 48 hour(s))  CBG monitoring, ED     Status: Abnormal   Collection Time: 02/12/2023 12:27 PM  Result Value Ref Range   Glucose-Capillary 101 (H) 70 - 99 mg/dL    Comment: Glucose reference range applies only to samples taken after fasting for at least 8 hours.  Ethanol     Status: None   Collection Time: 02/17/2023 12:32 PM  Result Value Ref Range   Alcohol, Ethyl (B) <10 <10 mg/dL    Comment: (NOTE) Lowest detectable limit for serum alcohol is 10 mg/dL.  For medical purposes only. Performed at [facility], [place] [address], [place], [place] [address]   Protime-INR     Status: None   Collection Time: 02/05/2023 12:32 PM  Result Value Ref Range   Prothrombin Time 14.6 11.4 - 15.2 seconds   INR 1.2 0.8 - 1.2    Comment: (NOTE) INR goal varies based on device and disease states. Performed at [facility], [place] [address], [place], [place] [address]   APTT     Status: None   Collection Time: 02/16/2023 12:32 PM  Result Value Ref Range   aPTT 29 24 - 36 seconds    Comment: Performed at [facility] [address] [place], [place] [address]  CBC     Status: Abnormal   Collection Time: 02/26/2023 12:32 PM  Result Value Ref Range   WBC 9.5 4.0 - 10.5 K/uL   RBC 3.44 (L)  3.87 - 5.11 MIL/uL   Hemoglobin 9.8 (L) 12.0 - 15.0 g/dL   HCT 30.6 (L) 36.0 - 46.0 %   MCV 89.0 80.0 - 100.0 fL   MCH 28.5 26.0 - 34.0 pg   MCHC 32.0 30.0 - 36.0 g/dL   RDW 14.7 11.5 - 15.5 %   Platelets 343 150 - 400 K/uL   nRBC 0.0 0.0 - 0.2 %    Comment: Performed at [facility], [place] [address] [place], [place] [address]  Differential     Status: None   Collection Time: 02/05/2023 12:32 PM  Result Value Ref Range   Neutrophils Relative %  73 %   Neutro Abs 6.9 1.7 - 7.7 K/uL   Lymphocytes Relative 14 %   Lymphs Abs 1.4 0.7 - 4.0 K/uL   Monocytes Relative 8 %   Monocytes Absolute 0.8 0.1 - 1.0 K/uL   Eosinophils Relative 4 %   Eosinophils Absolute 0.4 0.0 - 0.5 K/uL   Basophils Relative 1 %   Basophils Absolute 0.1 0.0 - 0.1 K/uL   Immature Granulocytes 0 %   Abs Immature Granulocytes 0.02 0.00 - 0.07 K/uL    Comment: Performed at [facility] [place] [address] [place], [place] [address]  Comprehensive metabolic panel     Status: Abnormal   Collection Time: 02/28/2023 12:32 PM  Result Value Ref Range   Sodium 141 135 - 145 mmol/L   Potassium 4.2 3.5 - 5.1 mmol/L   Chloride 102 98 - 111 mmol/L   CO2 21 (L) 22 - 32 mmol/L   Glucose, Bld 106 (H) 70 - 99 mg/dL    Comment: Glucose reference range applies only to samples taken after fasting for at least 8 hours.   BUN 25 (H) 8 - 23 mg/dL   Creatinine, Ser 1.10 (H) 0.44 - 1.00 mg/dL   Calcium 8.6 (L) 8.9 - 10.3 mg/dL   Total Protein 6.8 6.5 - 8.1 g/dL   Albumin 2.8 (L) 3.5 - 5.0 g/dL   AST 43 (H) 15 - 41 U/L   ALT 39 0 - 44 U/L   Alkaline Phosphatase 79 38 - 126 U/L   Total Bilirubin 0.9 0.3 - 1.2 mg/dL   GFR, Estimated 48 (L) >60 mL/min    Comment: (NOTE) Calculated using the CKD-EPI Creatinine Equation (2021)    Anion gap 18 (H) 5 - 15    Comment: Performed at [facility], [place] [address] [place], [place] [address]  CK     Status: Abnormal   Collection Time: 03/01/2023 12:32 PM  Result Value Ref Range    Total CK 265 (H) 38 - 234 U/L    Comment: Performed at [facility], [place] [address] [place],  [address]  I-stat chem 8, ED     Status: Abnormal   Collection Time: 02/18/2023 12:54 PM  Result Value Ref Range   Sodium 140 135 - 145 mmol/L   Potassium 4.1 3.5 - 5.1 mmol/L   Chloride 106 98 - 111 mmol/L   BUN 26 (H) 8 - 23 mg/dL   Creatinine, Ser 1.10 (H) 0.44 - 1.00 mg/dL   Glucose, Bld 104 (H) 70 - 99 mg/dL    Comment: Glucose reference range applies only to samples taken after fasting for at least 8 hours.   Calcium, Ion 1.03 (L) 1.15 - 1.40 mmol/L   TCO2 23 22 - 32 mmol/L   Hemoglobin 9.9 (L) 12.0 - 15.0 g/dL   HCT 29.0 (L) 36.0 - 46.0 %   DG Chest Portable 1 View  Result Date: 01/31/2023 CLINICAL DATA:  Altered mental status EXAM: PORTABLE CHEST 1 VIEW COMPARISON:  Previous studies including the examination of 08/11/2022 FINDINGS: Transverse diameter of heart is increased. There are no signs of pulmonary edema. Left hemidiaphragm is elevated. There is crowding of markings in left lower lung field. Rest of the lung fields are clear. Left lateral CP angle is indistinct. There is no pneumothorax. There is possible old bone infarct in proximal right humerus. IMPRESSION: Cardiomegaly. There are no signs of pulmonary edema. Increased markings in left lower lung field may suggest crowding  of bronchovascular structures due to elevation of left hemidiaphragm or atelectasis/pneumonia. Electronically Signed   By: Elmer Picker M.D.   On: 02/04/2023 13:44   CT ANGIO HEAD NECK W WO CM W PERF (CODE STROKE)  Result Date: 02/19/2023 CLINICAL DATA:  Neuro deficit, acute, stroke suspected. Left-sided vision issue with right-sided weakness. EXAM: CT ANGIOGRAPHY HEAD AND NECK CT PERFUSION BRAIN TECHNIQUE: Multidetector CT imaging of the head and neck was performed using the standard protocol during bolus administration of intravenous contrast. Multiplanar CT image reconstructions and MIPs were  obtained to evaluate the vascular anatomy. Carotid stenosis measurements (when applicable) are obtained utilizing NASCET criteria, using the distal internal carotid diameter as the denominator. Multiphase CT imaging of the brain was performed following IV bolus contrast injection. Subsequent parametric perfusion maps were calculated using RAPID software. RADIATION DOSE REDUCTION: This exam was performed according to the departmental dose-optimization program which includes automated exposure control, adjustment of the mA and/or kV according to patient size and/or use of iterative reconstruction technique. CONTRAST:  133mL OMNIPAQUE IOHEXOL 350 MG/ML SOLN COMPARISON:  Head CT 02/03/2023. FINDINGS: CTA NECK FINDINGS Aortic arch: Three-vessel arch configuration. Atherosclerotic calcifications of the aortic arch and arch vessel origins. Arch vessel origins are patent. Right carotid system: No evidence of dissection, stenosis (50% or greater) or occlusion. Calcified plaque along the right carotid bulb and proximal right cervical ICA. Left carotid system: No evidence of dissection, stenosis (50% or greater) or occlusion. Mild calcified plaque along the left carotid bulb and proximal left cervical ICA. Vertebral arteries: Tortuous V1 segments bilaterally. Otherwise, patent from the origin to the confluence with the basilar without stenosis or dissection. Skeleton: No suspicious bone lesions. Other neck: Unremarkable. Upper chest: Unremarkable. Review of the MIP images confirms the above findings CTA HEAD FINDINGS Anterior circulation: Calcified plaque along the carotid siphons without hemodynamically significant stenosis. Mild ectasia of the cavernous and clinoid segments bilaterally. Occlusion of the distal M1 segment of the left MCA. The right MCA and ACAs are patent proximally without aneurysm or stenosis. Diminished distal branches in the left MCA and ACA territories. Posterior circulation: Normal basilar artery. The  SCAs, AICAs and PICAs are patent proximally. The PCAs are patent proximally without stenosis or aneurysm. Distal branches are symmetric. Venous sinuses: As permitted by contrast timing, patent. Anatomic variants: None. Review of the MIP images confirms the above findings CT Brain Perfusion Findings: ASPECTS: 2 CBF (<30%) Volume: 150mL Perfusion (Tmax>6.0s) volume: 145mL Mismatch Volume: 25mL, ratio 1.1 Infarction Location:Left MCA territory. IMPRESSION: 1. Occlusion of the distal M1 segment of the left MCA. 2. No hemodynamically significant stenosis in the neck. 3. Perfusion imaging demonstrates 153 mL of core infarct in the left MCA territory with 19 mL of penumbra. 4. Aortic Atherosclerosis (ICD10-I70.0). Electronically Signed   By: Emmit Alexanders M.D.   On: 02/23/2023 13:21   CT HEAD CODE STROKE WO CONTRAST  Result Date: 02/23/2023 CLINICAL DATA:  Code stroke.  Stroke suspected EXAM: CT HEAD WITHOUT CONTRAST TECHNIQUE: Contiguous axial images were obtained from the base of the skull through the vertex without intravenous contrast. RADIATION DOSE REDUCTION: This exam was performed according to the departmental dose-optimization program which includes automated exposure control, adjustment of the mA and/or kV according to patient size and/or use of iterative reconstruction technique. COMPARISON:  None Available. FINDINGS: Brain: Large left MCA territory infarct involving large portions of the left frontal lobe, left parietal lobe, anterior left temporal lobe and the left insular cortex. There is likely also an  infarct in the left insular cortex. Vascular: Hyperdense left MCA Skull: Normal. Negative for fracture or focal lesion. Sinuses/Orbits: No middle ear or mastoid effusion. Mucosal thickening left maxillary and bilateral sphenoid sinuses. Bilateral lens replacement. Orbits are otherwise unremarkable. Other: None. ASPECTS ([place]man Stroke Program Early CT Score) - Ganglionic level infarction (caudate,  lentiform nuclei, internal capsule, insula, M1-M3 cortex): 2 - Supraganglionic infarction (M4-M6 cortex): 0 Total score (0-10 with 10 being normal): 2 IMPRESSION: Large left MCA territory infarct involving large portions of the left frontal lobe, left parietal lobe, anterior left temporal lobe and the left insular cortex. Aspects is 2. No hemorrhage. Findings were discussed with Dr. Pearline Cables on 01/31/2023 at 12:56 PM. Electronically Signed   By: Marin Roberts M.D.   On: 02/11/2023 12:57    Pending Labs Unresulted Labs (From admission, onward)    None       Vitals/Pain Today's Vitals   02/05/2023 1253 02/10/2023 1310 02/08/2023 1515 02/12/2023 1539  BP:   (!) 155/68   Pulse:   76   Resp:   19   Temp: 97.7 F (36.5 C)   (!) 100.6 F (38.1 C)  TempSrc: Rectal   Axillary  SpO2:  98% 93%     Isolation Precautions No active isolations  Medications Medications  acetaminophen (TYLENOL) tablet 650 mg (has no administration in time range)    Or  acetaminophen (TYLENOL) suppository 650 mg (has no administration in time range)  ondansetron (ZOFRAN-ODT) disintegrating tablet 4 mg (has no administration in time range)    Or  ondansetron (ZOFRAN) injection 4 mg (has no administration in time range)  glycopyrrolate (ROBINUL) injection 0.4 mg (has no administration in time range)  antiseptic oral rinse (BIOTENE) solution 15 mL (has no administration in time range)  polyvinyl alcohol (LIQUIFILM TEARS) 1.4 % ophthalmic solution 1 drop (has no administration in time range)  morphine (PF) 2 MG/ML injection 1-2 mg (has no administration in time range)  LORazepam (ATIVAN) 2 MG/ML concentrated solution 1 mg (has no administration in time range)    Or  LORazepam (ATIVAN) injection 1 mg (has no administration in time range)  haloperidol (HALDOL) 2 MG/ML solution 0.5 mg (has no administration in time range)    Or  haloperidol lactate (HALDOL) injection 0.5 mg (has no administration in time range)  bisacodyl  (DULCOLAX) suppository 10 mg (has no administration in time range)  iohexol (OMNIPAQUE) 350 MG/ML injection 100 mL (100 mLs Intravenous Contrast Given 02/26/2023 1258)    Mobility      Focused Assessments    R Recommendations: See Admitting Provider Note  Report given to:   Additional Notes: Patient comfort care, code stroke initially, massive L hemisphere occlusion. Pt is DNR, DNI. Family at bedside, palliative team has appropriate orders in at this time

## 2023-02-24 NOTE — H&P (Addendum)
Hospital Admission History and Physical Service Pager: [phone]  Patient name: Cheryl Atkinson Medical record number: [id number] Date of Birth: 1931-05-07 Age: 89 y.o. Gender: female  Primary Care Provider: Marin Olp, MD Consultants: palliative, neurology  Code Status: DNR/DNI which was confirmed with family if patient unable to confirm  Preferred Emergency Contact:  [name] Summer (sister) POA: [phone]   Chief Complaint: left sided gaze, drooling out of left side of mouth, less responsive on R side, LKW in middle of night   Assessment and Plan: AARIANNA NALLEY is a 89 y.o. female presenting as code stroke . Differential for presentation of this includes L MCA stroke .   PMHx includes Afib not on AC, CHF, COPD, GERD.   * Stroke [facility]) S/p large L MCA stroke, LKW 2130, out of window for thrombolysis and thrombectomy. Seen by neurology who spoke with family regarding limited prognosis and likelihood of worsening cerebral edema. Palliative care consulted and following. Family preference for palliative care in hospital vs. inpatient hospice.  -admit to med-tele, attending Dr. Ardelia Mems  -Palliative care following, appreciate assistance  -comfort care measures: tylenol, biotene (dry mouth), dulcolax for moderate constipation, robinul 0.4mg  IV q4h, haldol 0.5mg  SL q4h PRN for agitation or delirium, ativan 1mg  PRN for agitation or delirium, morphine for severe pain or SOB, zofran for nausea, liquifilm for dry eyes     HTN: stop amlodipine RLS: stop sinemet  CHF: stop lasix, irbesartan, Kclor HLD: stop crestor Afib: not on AC  FEN/GI: NPO unless awake enough for comfort feeds  VTE Prophylaxis: none  Disposition: Med-tele   History of Present Illness:  Cheryl Atkinson is a 89 y.o. female presenting with stroke.    Sister and brother-in-law at bedside. They report that she had just moved to assisted living facility 2 weeks ago. They are aware of limited prognosis with  current neurological deficits. They would like patient to pass away in the hospital or at an inpatient hospice facility. Patient has not been responding to family in the hospital. They have family coming back to see patient and are going to reach out to patient's pastor.   Presented to ED via EMS, LKW 2130. Altered mental status this AM. Not on any blood thinners. She lives in independent living portion of assisted living facility and was unable to be aroused this am. At baseline, she can use walker. She is currently gurgling. Family is opting for comfort care at this point.   In the ED, code stroke activated. CT negative for hemorrhage. CTAP obtained. L MCA M1 occlusion, perfusion with large core and ASPECTS 2. Out of window for thrombectomy and thrombolytic.   Review Of Systems: Per HPI with the following additions: unable to obtain due to altered mental status   Pertinent Past Medical History: CHF COPD HLD HTN RLS  Arthritis  Osteoporosis   Remainder reviewed in history tab.   Pertinent Past Surgical History: Appendectomy Cholecystectomy  Hysterectomy    Remainder reviewed in history tab.  Pertinent Social History: Tobacco use: Former, quit 2016 Alcohol use: None Other Substance use: None Lives at [facility]   Pertinent Family History: Mother: heart disease Father: heart disease, heart attack  Remainder reviewed in history tab.   Important Outpatient Medications: Amlodipine 5 daily Sinemet IR 25-100 for worsening RLS  Ferrous sulfate 325mg  2 times a week  Lasix 40 daily Irbesartan 300 daily Remeron 7.5mg  QHS  KCLor Crestor 40 daily   Remainder reviewed in medication history.  Objective: BP (!) 145/66   Pulse 62   Temp 97.7 F (36.5 C) (Rectal)   Resp 16   LMP  (LMP Unknown)   SpO2 98%  Exam: General: non-verbal, appears to be resting comfortably, in NAD HEENT: dry mucous membranes   Cardiovascular: irregular rhythm. Regular rate.    Respiratory: Secretions heard throughout. Normal WOB on RA.  Gastrointestinal: soft, non-tender, non-distended  Neuro: head turned towards left, R facial droop  Labs:  CBC BMET  Recent Labs  Lab 02/23/2023 1232 02/14/2023 1254  WBC 9.5  --   HGB 9.8* 9.9*  HCT 30.6* 29.0*  PLT 343  --    Recent Labs  Lab 02/02/2023 1232 02/26/2023 1254  NA 141 140  K 4.2 4.1  CL 102 106  CO2 21*  --   BUN 25* 26*  CREATININE 1.10* 1.10*  GLUCOSE 106* 104*  CALCIUM 8.6*  --     Pertinent additional labs  Ethanol <10 PT 14.6, INR 1.2  PTT 29 CK 265   EKG: My own interpretation (not copied from electronic read) Afib   Imaging Studies Performed: 3/25 CT head: IMPRESSION: Large left MCA territory infarct involving large portions of the left frontal lobe, left parietal lobe, anterior left temporal lobe and the left insular cortex. Aspects is 2. No hemorrhage.   3/25 CTA head and neck: IMPRESSION: 1. Occlusion of the distal M1 segment of the left MCA. 2. No hemodynamically significant stenosis in the neck. 3. Perfusion imaging demonstrates 153 mL of core infarct in the left MCA territory with 19 mL of penumbra. 4. Aortic Atherosclerosis (ICD10-I70.0).  3/25 CXR:  IMPRESSION: Cardiomegaly. There are no signs of pulmonary edema. Increased markings in left lower lung field may suggest crowding of bronchovascular structures due to elevation of left hemidiaphragm or atelectasis/pneumonia.  Rolanda Lundborg, MD 02/23/2023, 3:23 PM PGY-1, [place] Intern pager: [phone], text pages welcome Secure chat group [facility] Teaching Service   Upper Level Addendum: I have seen and evaluated this patient along with Dr. Lurline Hare and reviewed the above note, making necessary revisions as appropriate. I agree with the medical decision making and physical exam as noted above. Ezequiel Essex, MD PGY-3 [facility]

## 2023-02-24 NOTE — Anesthesia Preprocedure Evaluation (Signed)
Anesthesia Evaluation    Reviewed: Allergy & Precautions, Patient's Chart, lab work & pertinent test results  Airway        Dental   Pulmonary COPD, former smoker          Cardiovascular hypertension, + Peripheral Vascular Disease and +CHF  + dysrhythmias Atrial Fibrillation   IMPRESSIONS     1. Left ventricular ejection fraction, by estimation, is 60 to 65%. The  left ventricle has normal function. The left ventricle has no regional  wall motion abnormalities. Left ventricular diastolic parameters are  indeterminate in the setting of atrial  fibrillation.   2. Right ventricular systolic function is normal. The right ventricular  size is normal. There is mildly elevated pulmonary artery systolic  pressure. The estimated right ventricular systolic pressure is 123456 mmHg.   3. Left atrial size was severely dilated.   4. Right atrial size was moderately dilated.   5. The mitral valve is grossly normal, mildly thickened and with mild  annular calcification. Mild to moderate mitral valve regurgitation, two  jets.   6. The aortic valve is tricuspid, mildly thickened and with mild to  moderate annular calcification. Aortic valve regurgitation is not  visualized.   7. The inferior vena cava is normal in size with greater than 50%  respiratory variability, suggesting right atrial pressure of 3 mmHg.   8. Evidence of atrial level shunting detected by color flow Doppler.  There is a small patent foramen ovale with predominantly left to right  shunting across the atrial septum.      Neuro/Psych  PSYCHIATRIC DISORDERS  Depression    CVA    GI/Hepatic Neg liver ROS, hiatal hernia,GERD  ,,  Endo/Other  negative endocrine ROS    Renal/GU negative Renal ROS     Musculoskeletal negative musculoskeletal ROS (+)    Abdominal   Peds  Hematology negative hematology ROS (+)   Anesthesia Other Findings   Reproductive/Obstetrics                              Anesthesia Physical Anesthesia Plan  ASA: 4 and emergent  Anesthesia Plan: General   Post-op Pain Management: Minimal or no pain anticipated   Induction: Intravenous, Rapid sequence and Cricoid pressure planned  PONV Risk Score and Plan:   Airway Management Planned: Oral ETT  Additional Equipment: Arterial line  Intra-op Plan:   Post-operative Plan: Possible Post-op intubation/ventilation  Informed Consent:   Plan Discussed with: Anesthesiologist  Anesthesia Plan Comments:        Anesthesia Quick Evaluation

## 2023-02-24 NOTE — ED Triage Notes (Signed)
Pt arrived from [facility]. Staff went to check on pt when she didn't get up for breakfast and they found her laying in bed supine. Per EMS, Pt has GCS of 8, left sided gaze, drooling out left side of mouth, less responsive on right side. Pt's LKW is "some time in the middle of the night."

## 2023-02-24 NOTE — Consult Note (Signed)
Stroke Neurology Admission History & Physical   CC: code stroke  History is obtained from: Patient's sister and ED staff  HPI: Cheryl Atkinson is a 89 y.o. female seen in neurological consultation in the ED, regarding evaluation and management of acute onset stroke symptoms, in the presence of her sister. She resides in a nursing home facility where she was noted with acute onset speech arrest, dense right hemiparesis and left gaze preference. Her LKW was 18:00 hours on 02/23/23. She was stated to be in her baseline, conversant, ambulatory using a walker or WC to assist with mobility as needed. Currently she has decreased mentation, unresponsive to verbal commands, aroused by NS only.    LKW: 18:00 hrs. On 02/23/23 IV thrombolysis given?: no, her presentation was outside the window Mechanical thrombectomy: No, Aspect of 2 with large core and small penumbra Premorbid modified Rankin scale (mRS): 4 0-Completely asymptomatic and back to baseline post-stroke 1-No significant post stroke disability and can perform usual duties with stroke symptoms 2-Slight disability-UNABLE to perform all activities but does not need assistance  3-Moderate disability-requires help but walks WITHOUT assistance 4-Needs assistance to walk and tend to bodily needs 5-Severe disability-bedridden, incontinent, needs constant attention 6- Death  ROS:  Unable to obtain due to altered mental status.   Past Medical History:  Diagnosis Date   Acute cholecystitis 12/29/2019   Anemia    Arthritis    "shoulders" (05/04/2015)   Cellulitis of right lower extremity 11/27/2013   CHF (congestive heart failure) (HCC)    Chronic lower back pain    Constipation    COPD (chronic obstructive pulmonary disease) (HCC)    GERD (gastroesophageal reflux disease)    History of hiatal hernia    HLD (hyperlipidemia)    Hypertension    Insomnia    Osteoporosis    Peripheral arterial disease ([place])    Restless leg syndrome    Thyroid  disease     Family History  Problem Relation Age of Onset   Heart disease Mother    Heart disease Father    Heart attack Father    Cancer Sister        Breast Cancer   Atrial fibrillation Sister    Stroke Maternal Grandmother    Cancer Paternal Grandmother    Coronary artery disease Other    Social History:   reports that she quit smoking about 7 years ago. Her smoking use included cigarettes. She has a 30.00 pack-year smoking history. She has never used smokeless tobacco. She reports that she does not drink alcohol and does not use drugs.  Medications No current facility-administered medications for this encounter.  Current Outpatient Medications:    amLODipine (NORVASC) 5 MG tablet, TAKE 1 TABLET(5 MG) BY MOUTH DAILY, Disp: 30 tablet, Rfl: 5   carbidopa-levodopa (SINEMET IR) 25-100 MG tablet, TAKE 1 TABLET BY MOUTH DAILY AS NEEDED FOR WORSENING RLS, Disp: 90 tablet, Rfl: 1   ferrous sulfate 325 (65 FE) MG tablet, Take 325 mg by mouth 2 (two) times a week. Patient takes one tablet on Monday and Friday, Disp: , Rfl:    furosemide (LASIX) 40 MG tablet, Take 1 tablet (40 mg total) by mouth daily., Disp: 90 tablet, Rfl: 3   irbesartan (AVAPRO) 300 MG tablet, Take 1 tablet (300 mg total) by mouth daily., Disp: 14 tablet, Rfl: 0   mirtazapine (REMERON) 7.5 MG tablet, Take 1 tablet (7.5 mg total) by mouth at bedtime., Disp: 30 tablet, Rfl: 5   Multiple Vitamins-Minerals (  PRESERVISION AREDS 2 PO), Take 1 tablet by mouth 2 (two) times daily., Disp: , Rfl:    polyethylene glycol (MIRALAX / GLYCOLAX) 17 g packet, Take 17 g by mouth daily as needed for mild constipation., Disp: , Rfl:    potassium chloride (KLOR-CON M) 10 MEQ tablet, Take 1 tablet (10 mEq total) by mouth daily as needed (take with each dose of lasix.). Needs to be taken  With each dose of lasix, Disp: , Rfl:    rosuvastatin (CRESTOR) 40 MG tablet, TAKE 1 TABLET DAILY, Disp: 90 tablet, Rfl: 3   rotigotine (NEUPRO) 2 MG/24HR,  Place 1 patch onto the skin daily., Disp: 30 patch, Rfl: 12  Exam: Current vital signs: BP (!) 145/66   Pulse 62   Temp 97.7 F (36.5 C) (Rectal)   Resp 16   LMP  (LMP Unknown)   SpO2 98%  Vital signs in last 24 hours: Temp:  [97.7 F (36.5 C)] 97.7 F (36.5 C) (03/25 1253) Pulse Rate:  [62-75] 62 (03/25 1230) Resp:  [16-17] 16 (03/25 1230) BP: (145-168)/(64-66) 145/66 (03/25 1230) SpO2:  [95 %-100 %] 98 % (03/25 1310)  NEURO:  MS: non-verbal, grimaces to pain, obtunded CN II-XII: Left gaze preference, no blink to threat-OD, right facial droop, unable to fix or follow Motor: has antigravity strength in the LUE, but no volitional effort in the right hemibody or LLE Sensory:WD X 4 Coordination: N/T due to AMS  NIHSS 1a Level of Conscious.: 2 1b LOC Questions: 2 1c LOC Commands: 2 2 Best Gaze: 2 3 Visual: 2 4 Facial Palsy: 2 5a Motor Arm - left: 2 5b Motor Arm - Right: 3 6a Motor Leg - Left: 2 6b Motor Leg - Right: 3 7 Limb Ataxia: 0 8 Sensory: 2 9 Best Language: 3 10 Dysarthria: 2 11 Extinct. and Inatten.: 1 TOTAL: 30  Labs I have reviewed labs in epic and the results pertinent to this consultation are:  CBC    Component Value Date/Time   WBC 9.5 02/21/2023 1232   RBC 3.44 (L) 02/26/2023 1232   HGB 9.9 (L) 02/10/2023 1254   HCT 29.0 (L) 03/01/2023 1254   PLT 343 02/09/2023 1232   MCV 89.0 02/19/2023 1232   MCH 28.5 02/28/2023 1232   MCHC 32.0 02/18/2023 1232   RDW 14.7 02/20/2023 1232   LYMPHSABS 1.4 02/01/2023 1232   MONOABS 0.8 02/07/2023 1232   EOSABS 0.4 02/23/2023 1232   BASOSABS 0.1 02/07/2023 1232    CMP     Component Value Date/Time   NA 140 02/01/2023 1254   K 4.1 03/01/2023 1254   CL 106 01/31/2023 1254   CO2 21 (L) 02/28/2023 1232   GLUCOSE 104 (H) 02/06/2023 1254   BUN 26 (H) 02/17/2023 1254   CREATININE 1.10 (H) 02/26/2023 1254   CREATININE 0.71 06/27/2020 0947   CALCIUM 8.6 (L) 02/28/2023 1232   PROT 6.8 03/01/2023 1232    PROT 6.9 05/25/2018 0955   ALBUMIN 2.8 (L) 02/20/2023 1232   ALBUMIN 4.2 05/25/2018 0955   AST 43 (H) 02/09/2023 1232   ALT 39 02/17/2023 1232   ALKPHOS 79 02/07/2023 1232   BILITOT 0.9 02/08/2023 1232   BILITOT 0.4 05/25/2018 0955   GFRNONAA 48 (L) 02/01/2023 1232   GFRAA >60 05/31/2020 0316    Lipid Panel     Component Value Date/Time   CHOL 163 02/12/2022 1008   CHOL 179 05/06/2018 0938   TRIG 45.0 02/12/2022 1008   HDL 86.70 02/12/2022 1008  HDL 92 05/06/2018 0938   CHOLHDL 2 02/12/2022 1008   VLDL 9.0 02/12/2022 1008   LDLCALC 68 02/12/2022 1008   LDLCALC 73 05/06/2018 0938   LDLDIRECT 60.0 10/09/2021 1213   Imaging I have reviewed the images obtained:  CT-head w/o contrast: Large left MCA territory infarct involving large portions of the left frontal lobe, left parietal lobe, anterior left temporal lobe and the left insular cortex. Aspects is 2.   CTA and CTP:  1. Occlusion of the distal M1 segment of the left MCA. 2. No hemodynamically significant stenosis in the neck. 3. Perfusion imaging demonstrates 153 mL of core infarct in the left MCA territory with 19 mL of penumbra. 4. Aortic Atherosclerosis (ICD10-I70.0).  Assessment:   Acute embolic cerebral infarction in the left MCA and ACA distribution, likely due to cardiogenic embolization secondary to history of atrial fibrillation, off anticoagulation is the likely cause of this large left hemispheric infarct. She is not a candidate for IV thrombolysis, since her presentation is outside the treatment window and mechanical thrombectomy was deferred since no salvageable penumbra. Her prognosis is extremely guarded and the likelihood of worsening cerebral edema 2/2 large area of ischemic infarct was informed to patient's sister and her brother who has the POA.   Recommendations:  Per patient's wishes and discussion with her family, established DNR and DNI status. The decision is to aim for comfort care only. This  was communicated with the ED physician. -- Thomasene Ripple, MD Neurologist [facility] Pager: [phone]

## 2023-02-25 DIAGNOSIS — I63312 Cerebral infarction due to thrombosis of left middle cerebral artery: Secondary | ICD-10-CM

## 2023-02-25 DIAGNOSIS — I639 Cerebral infarction, unspecified: Secondary | ICD-10-CM | POA: Diagnosis not present

## 2023-02-25 DIAGNOSIS — Z515 Encounter for palliative care: Secondary | ICD-10-CM | POA: Diagnosis not present

## 2023-02-25 NOTE — Progress Notes (Addendum)
STROKE TEAM PROGRESS NOTE   SUBJECTIVE (INTERVAL HISTORY) Her family is at the bedside.  Patient was evaluated laying comfortably in bed. Discussed with family the extent of her stroke and answered their questions.  Patient actively dying but appears comfortable.  OBJECTIVE Vitals:   02/21/2023 1515 02/20/2023 1539 02/12/2023 1640 02/25/23 0443  BP: (!) 155/68  (!) 151/60 (!) 149/75  Pulse: 76  85 79  Resp: 19  15   Temp:  (!) 100.6 F (38.1 C) 99.3 F (37.4 C) (!) 101.1 F (38.4 C)  TempSrc:  Axillary Oral Oral  SpO2: 93%  96% 97%    CBC:  Recent Labs  Lab 02/16/2023 1232 02/23/2023 1254  WBC 9.5  --   NEUTROABS 6.9  --   HGB 9.8* 9.9*  HCT 30.6* 29.0*  MCV 89.0  --   PLT 343  --     Basic Metabolic Panel:  Recent Labs  Lab 02/18/2023 1232 02/20/2023 1254  NA 141 140  K 4.2 4.1  CL 102 106  CO2 21*  --   GLUCOSE 106* 104*  BUN 25* 26*  CREATININE 1.10* 1.10*  CALCIUM 8.6*  --     Lipid Panel: No results for input(s): "CHOL", "TRIG", "HDL", "CHOLHDL", "VLDL", "LDLCALC" in the last 168 hours. HgbA1c:  Lab Results  Component Value Date   HGBA1C 6.5 08/22/2022   Urine Drug Screen: No results found for: "LABOPIA", "COCAINSCRNUR", "LABBENZ", "AMPHETMU", "THCU", "LABBARB"  Alcohol Level     Component Value Date/Time   ETH <10 03/01/2023 1232    IMAGING  Results for orders placed or performed during the hospital encounter of 08/06/22  CT Head Wo Contrast   Narrative   CLINICAL DATA:  Head trauma, minor (Age >= 65y); Neck trauma (Age >= 65y). Fall  EXAM: CT HEAD WITHOUT CONTRAST  CT CERVICAL SPINE WITHOUT CONTRAST  TECHNIQUE: Multidetector CT imaging of the head and cervical spine was performed following the standard protocol without intravenous contrast. Multiplanar CT image reconstructions of the cervical spine were also generated.  RADIATION DOSE REDUCTION: This exam was performed according to the departmental dose-optimization program which includes  automated exposure control, adjustment of the mA and/or kV according to patient size and/or use of iterative reconstruction technique.  COMPARISON:  CT head 06/08/2022  FINDINGS: CT HEAD FINDINGS  BRAIN: BRAIN Cerebral ventricle sizes are concordant with the degree of cerebral volume loss. Patchy and confluent areas of decreased attenuation are noted throughout the deep and periventricular white matter of the cerebral hemispheres bilaterally, compatible with chronic microvascular ischemic disease.  No evidence of large-territorial acute infarction. No parenchymal hemorrhage. No mass lesion. No extra-axial collection.  No mass effect or midline shift. No hydrocephalus. Basilar cisterns are patent.  Vascular: No hyperdense vessel. Atherosclerotic calcifications are present within the cavernous internal carotid arteries.  Skull: No acute fracture or focal lesion. Temporomandibular joint degenerative changes.  Sinuses/Orbits: Bilateral sphenoid sinus mucosal thickening. Otherwise paranasal sinuses and mastoid air cells are clear. Bilateral lens replacement. Otherwise the orbits are unremarkable.  Other: No large scalp hematoma.  CT CERVICAL SPINE FINDINGS  Alignment: Normal.  Skull base and vertebrae: No acute fracture. No aggressive appearing focal osseous lesion or focal pathologic process.  Soft tissues and spinal canal: No prevertebral fluid or swelling. No visible canal hematoma.  Upper chest: Unremarkable.  Other: None.  IMPRESSION: 1. No acute intracranial abnormality. 2. No acute displaced fracture or traumatic listhesis of the cervical spine. 3. Bilateral sphenoid sinus disease.   Electronically  Signed   By: Iven Finn M.D.   On: 08/07/2022 00:20     PHYSICAL EXAM General: Acutely ill elderly woman laying comfortably in bed.  No acute distress. HEENT: [place]/AT.  Respiratory: Comfortable with no respiratory distress  Neuro: Obtunded, nonverbal,  eyes closed. Mild right facial droop. Withdraws to painful stimuli.  In all 4 extremities.   ASSESSMENT/PLAN Cheryl Atkinson is a 89 y.o. female with history of A-fib, CHF, COPD, GERD, hypertension, insomnia, admitted on 02/19/2023 with altered mental status. Facility reported right-sided neglect, aphasia, left gaze preference. Imaging obtained in the ED showed large left MCA territory infarct, left MCA M1 occlusion, likely due to A-fib off anticoagulation. She was not a candidate for IV thrombolysis, since her presentation was outside the treatment window and mechanical thrombectomy was deferred since no salvageable penumbra.  Her prognosis is maintained guarded and the likelihood of worsening cerebral edema 2/2 large area of ischemic infarct was informed to patient's sister and her brother who has the POA.  Family decided to transition to comfort care in the ED.  Palliative care was consulted to assist with this and comfort orders were placed.  CT-head w/o contrast: Large left MCA territory infarct involving large portions of the left frontal lobe, left parietal lobe, anterior left temporal lobe and the left insular cortex. Aspects is 2.   CTA and CTP: 1. Occlusion of the distal M1 segment of the left MCA. 2. No hemodynamically significant stenosis in the neck. 3. Perfusion imaging demonstrates 153 mL of core infarct in the left MCA territory with 19 mL of penumbra. 4. Aortic Atherosclerosis (ICD10-I70.0).  Diet Order             Diet NPO time specified Except for: Ice Chips  Diet effective now                   Hospital day # 1  STROKE MD NOTE :  I have personally obtained history,examined this patient, reviewed notes, independently viewed imaging studies, participated in medical decision making and plan of care.ROS completed by me personally and pertinent positives fully documented  I have made any additions or clarifications directly to the above note. Agree with note above.   Patient presented with a large left hemispheric infarct with aphasia and dense hemiplegia with CT scan showing hyperdense MCA sign with poor aspect score of 2 with almost entire left hemispheric slowing.  Her chances of surviving this and making meaningful neurological improvement are quite negligible and quality of life if she is all right with hypercatabolic.  Family understands this and made her DNR and comfort care and I agree with the plan.  Continue comfort care measures.  No further stroke workup is necessary.  Stroke team will sign off.  Kindly call for questions.  Long discussion with patient multiple family are also at the bedside and answered questions.  Greater than 50% time during this 35-minute visit with spent in counseling and coordination of care about a large stroke and poor prognosis and discussion about comfort care measures and answering questions.  Antony Contras, MD Medical Director [facility] Pager: [phone] 02/25/2023 5:16 PM  To contact Stroke Continuity provider, please refer to [email or web address] After hours, contact General Neurology

## 2023-02-25 NOTE — Progress Notes (Signed)
Daily Progress Note   Patient Name: Cheryl Atkinson       Date: 02/25/2023 DOB: 1931-03-10  Age: 89 y.o. MRN#: [id number] Attending Physician: Cheryl Rio, MD Primary Care Physician: Cheryl Olp, MD Admit Date: 02/21/2023  Reason for Consultation/Follow-up: Terminal Care  Subjective: Medical records reviewed including progress notes and MAR. Patient assessed at the bedside. She is unresponsive, breathing comfortably. No sweating or signs of discomfort from fever. No PRN medications have been required over the past 24 hours. No longer has excessive secretions. Patient's sister and two sons are present visiting during my assessment.  Discussed with RN and Therapist, sports regarding EOL visitation policy. Per leadership, 6 visitors at once will be allowed.  At sons' request, we reviewed patient's diagnosis and terminal prognosis of hours to days. They verbalized their understanding and agreement with comfort care. We briefly discussed the option of transfer to residential hospice, however sons are not comfortable participating in that decision and sister/HCPOA prefers to continue monitoring for now then reassess appropriateness of transfer. Emotional support and therapeutic listening was provided.  Questions and concerns addressed. PMT will continue to support holistically.   Length of Stay: 1   Physical Exam Vitals and nursing note reviewed.  Constitutional:      General: She is not in acute distress.    Appearance: She is ill-appearing.  Pulmonary:     Effort: Pulmonary effort is normal. No respiratory distress.  Skin:    General: Skin is warm and dry.  Neurological:     Mental Status: She is unresponsive.            Vital Signs: BP (!) 149/75   Pulse 79   Temp (!) 101.1  F (38.4 C) (Oral)   Resp 15   LMP  (LMP Unknown)   SpO2 97%  SpO2: SpO2: 97 % O2 Device: O2 Device: Room Air O2 Flow Rate:        Palliative Assessment/Data: 10%   Palliative Care Assessment & Plan   Patient Profile: 89 y.o. female  with past medical history of A-fib, CHF, COPD, GERD, hypertension, insomnia, admitted on 02/02/2023 with altered mental status.    Patient presents from [place] facility with [facility] of [facility] night.  Facility reported right-sided neglect, aphasia, left gaze preference.  Imaging obtained in the ED showed  large left MCA territory infarct, left MCA M1 occlusion, likely due to A-fib off anticoagulation.  She was not a candidate for IV thrombolysis or mechanical thrombectomy.  Family is interested in comfort care per ED physician. PMT has been consulted to assist with end-of-life care.   Assessment: End of life care  Recommendations/Plan: Continue DNR/DNI Continue comfort focused care, no adjustments to medications required today Patient's family wish to consider hospice facility placement at a later day if still appropriate  Psychosocial and emotional support provided Allow 6 visitors at once per EOL policy and unit leadership agreement PMT will continue to follow and support   Prognosis:  Hours - Days  Discharge Planning: Anticipated Hospital Death vs hospice facility   Care plan was discussed with RN, FMTS, Unit Director, patient's family    MDM Rosser, PA-C  Palliative Medicine Team Team phone # [phone]  Thank you for allowing the Palliative Medicine Team to assist in the care of this patient. Please utilize secure chat with additional questions, if there is no response within 30 minutes please call the above phone number.  Palliative Medicine Team providers are available by phone from 7am to 7pm daily and can be reached through the team cell phone.  Should this patient require assistance outside of these  hours, please call the patient's attending physician.

## 2023-02-25 NOTE — Progress Notes (Signed)
     Daily Progress Note Intern Pager: [phone]  Patient name: Cheryl Atkinson Medical record number: [id number] Date of birth: 08/13/1931 Age: 89 y.o. Gender: female  Primary Care Provider: Marin Olp, MD Consultants: palliative Code Status: DNR/DNI  Pt Overview and Major Events to Date:  3/25- admitted   Assessment and Plan: Cheryl Atkinson is a 89 y.o. female presenting as code stroke . Differential for presentation of this includes L MCA stroke .    PMHx includes Afib not on AC, CHF, COPD, GERD.   * Stroke [facility]) S/p large L MCA stroke, LKW 2130, out of window for thrombolysis and thrombectomy. Seen by neurology who spoke with family regarding limited prognosis and likelihood of worsening cerebral edema. Palliative care consulted and following. Family preference for palliative care in hospital vs. inpatient hospice.  -Palliative care following, appreciate assistance  -comfort care measures: tylenol, biotene (dry mouth), dulcolax for moderate constipation, robinul 0.4mg  IV q4h, haldol 0.5mg  SL q4h PRN for agitation or delirium, ativan 1mg  PRN for agitation or delirium, morphine for severe pain or SOB, zofran for nausea, liquifilm for dry eyes      HTN: stop amlodipine RLS: stop sinemet  CHF: stop lasix, irbesartan, Kclor HLD: stop crestor Afib: not on AC   FEN/GI: NPO unless awake enough for comfort feeds  VTE Prophylaxis: none  Subjective:  NAEO. Has been receiving glycopyrrolate. Sister at bedside this AM. She is older sister by 13 years. States patient has appeared comfortable. No changes in mental status. Asks about visitor policy, reached out to palliative care since sister states she was told that they are limited to 4 visitors in room.   Objective: Temp:  [97.7 F (36.5 C)-101.1 F (38.4 C)] 101.1 F (38.4 C) (03/26 0443) Pulse Rate:  [62-85] 79 (03/26 0443) Resp:  [15-19] 15 (03/25 1640) BP: (145-168)/(60-75) 149/75 (03/26 0443) SpO2:  [93 %-100 %] 97 %  (03/26 0443)  Physical Exam: General: non-verbal, appears to be resting comfortably, in NAD HEENT: dry mucous membranes   Cardiovascular: irregular rhythm. Regular rate.   Respiratory: Normal WOB on RA.  Gastrointestinal: soft, non-tender, non-distended  Neuro: head turned towards left, R facial droop  Laboratory: Most recent CBC Lab Results  Component Value Date   WBC 9.5 02/19/2023   HGB 9.9 (L) 02/13/2023   HCT 29.0 (L) 02/17/2023   MCV 89.0 02/02/2023   PLT 343 02/23/2023   Most recent BMP    Latest Ref Rng & Units 02/23/2023   12:54 PM  BMP  Glucose 70 - 99 mg/dL 104   BUN 8 - 23 mg/dL 26   Creatinine 0.44 - 1.00 mg/dL 1.10   Sodium 135 - 145 mmol/L 140   Potassium 3.5 - 5.1 mmol/L 4.1   Chloride 98 - 111 mmol/L 106    Cheryl Lundborg, MD 02/25/2023, 11:58 AM  PGY-1, [place] Intern pager: [phone], text pages welcome Secure chat group [place]

## 2023-02-25 NOTE — Plan of Care (Signed)
  Problem: Education: Goal: Knowledge of the prescribed therapeutic regimen will improve Outcome: Progressing   Problem: Coping: Goal: Ability to identify and develop effective coping behavior will improve Outcome: Progressing   Problem: Clinical Measurements: Goal: Quality of life will improve Outcome: Progressing   Problem: Respiratory: Goal: Verbalizations of increased ease of respirations will increase Outcome: Progressing   Problem: Role Relationship: Goal: Family's ability to cope with current situation will improve Outcome: Progressing Goal: Ability to verbalize concerns, feelings, and thoughts to partner or family member will improve Outcome: Progressing   Problem: Pain Management: Goal: Satisfaction with pain management regimen will improve Outcome: Progressing   Problem: Education: Goal: Knowledge of General Education information will improve Description: Including pain rating scale, medication(s)/side effects and non-pharmacologic comfort measures Outcome: Progressing   Problem: Health Behavior/Discharge Planning: Goal: Ability to manage health-related needs will improve Outcome: Progressing   Problem: Clinical Measurements: Goal: Ability to maintain clinical measurements within normal limits will improve Outcome: Progressing Goal: Will remain free from infection Outcome: Progressing Goal: Diagnostic test results will improve Outcome: Progressing Goal: Respiratory complications will improve Outcome: Progressing Goal: Cardiovascular complication will be avoided Outcome: Progressing   Problem: Activity: Goal: Risk for activity intolerance will decrease Outcome: Progressing   Problem: Nutrition: Goal: Adequate nutrition will be maintained Outcome: Progressing   Problem: Coping: Goal: Level of anxiety will decrease Outcome: Progressing   Problem: Elimination: Goal: Will not experience complications related to bowel motility Outcome:  Progressing Goal: Will not experience complications related to urinary retention Outcome: Progressing   Problem: Pain Managment: Goal: General experience of comfort will improve Outcome: Progressing   Problem: Safety: Goal: Ability to remain free from injury will improve Outcome: Progressing   Problem: Skin Integrity: Goal: Risk for impaired skin integrity will decrease Outcome: Progressing   

## 2023-02-26 DIAGNOSIS — I639 Cerebral infarction, unspecified: Secondary | ICD-10-CM | POA: Diagnosis not present

## 2023-02-26 DIAGNOSIS — Z515 Encounter for palliative care: Secondary | ICD-10-CM | POA: Diagnosis not present

## 2023-02-26 NOTE — Progress Notes (Signed)
Daily Progress Note   Patient Name: Cheryl Atkinson       Date: 02/26/2023 DOB: 24-Sep-1931  Age: 89 y.o. MRN#: [id number] Attending Physician: Cheryl Rio, MD Primary Care Physician: Cheryl Olp, MD Admit Date: 03/01/2023  Reason for Consultation/Follow-up: Terminal Care  Subjective: Medical records reviewed including progress notes and MAR. Patient assessed at the bedside.  She is unresponsive and comfortable.  Respiratory rate is about 20-22.  Her sister and 2 sons are present visiting.  Discussed the option for utilizing as needed morphine versus scheduling morphine if patient becomes more tachypneic.  Family verbalized understanding and deferred for the time being.  Questions and concerns addressed. PMT will continue to support holistically.   Length of Stay: 2  Physical Exam Vitals and nursing note reviewed.  Constitutional:      General: She is not in acute distress.    Appearance: She is ill-appearing.  Pulmonary:     Effort: Pulmonary effort is normal. No respiratory distress.  Skin:    General: Skin is warm and dry.  Neurological:     Mental Status: She is unresponsive.            Vital Signs: BP (!) 126/57   Pulse 64   Temp (!) 101 F (38.3 C) (Oral)   Resp 15   LMP  (LMP Unknown)   SpO2 93%  SpO2: SpO2: 93 % O2 Device: O2 Device: Room Air O2 Flow Rate:        Palliative Assessment/Data: 10%   Palliative Care Assessment & Plan   Patient Profile: 89 y.o. female  with past medical history of A-fib, CHF, COPD, GERD, hypertension, insomnia, admitted on 03/02/2023 with altered mental status.    Patient presents from [facility] facility with [facility] of last night.  Facility reported right-sided neglect, aphasia, left gaze preference.  Imaging obtained in  the ED showed large left MCA territory infarct, left MCA M1 occlusion, likely due to A-fib off anticoagulation.  She was not a candidate for IV thrombolysis or mechanical thrombectomy.  Family is interested in comfort care per ED physician. PMT has been consulted to assist with end-of-life care.   Assessment: End of life care  Recommendations/Plan: Continue DNR/DNI Continue comfort focused care, no adjustments to medications required today Psychosocial and emotional support provided Allow 6 visitors at  once per EOL policy and unit leadership agreement Continue to provide family with space to consider transfer to hospice facility PMT will continue to follow and support   Prognosis:  Hours - Days  Discharge Planning: Anticipated Hospital Death    Care plan was discussed with patient's family    MDM [place], PA-C  Palliative Medicine Team Team phone # [phone]  Thank you for allowing the Palliative Medicine Team to assist in the care of this patient. Please utilize secure chat with additional questions, if there is no response within 30 minutes please call the above phone number.  Palliative Medicine Team providers are available by phone from 7am to 7pm daily and can be reached through the team cell phone.  Should this patient require assistance outside of these hours, please call the patient's attending physician.

## 2023-02-26 NOTE — Progress Notes (Signed)
     Daily Progress Note Intern Pager: [phone]  Patient name: Cheryl Atkinson Medical record number: [id number] Date of birth: Apr 03, 1931 Age: 89 y.o. Gender: female  Primary Care Provider: Marin Olp, MD Consultants: palliative, neurology (s/o) Code Status: DNR/DNI  Pt Overview and Major Events to Date:  3/25- admitted   Assessment and Plan: Cheryl Atkinson is a 89 y.o. female presenting as code stroke . Differential for presentation of this includes L MCA stroke .   PMHx includes Afib not on AC, CHF, COPD, GERD.   * Stroke [facility]) S/p large L MCA stroke, LKW 2130, out of window for thrombolysis and thrombectomy. Seen by neurology who spoke with family regarding limited prognosis and likelihood of worsening cerebral edema. Palliative care consulted and following. Family preference for palliative care in hospital, considering hospice facility placement at later day if still appropriate. -Palliative care following, appreciate assistance  -discussed with palliative care, patient can have 6 visitors at once.  -comfort care measures: tylenol, biotene (dry mouth), dulcolax for moderate constipation, robinul 0.4mg  IV q4h, haldol 0.5mg  SL q4h PRN for agitation or delirium, ativan 1mg  PRN for agitation or delirium, morphine for severe pain or SOB, zofran for nausea, liquifilm for dry eyes      HTN: stop amlodipine RLS: stop sinemet  CHF: stop lasix, irbesartan, Kclor HLD: stop crestor Afib: not on AC  FEN/GI: NPO unless awake enough for comfort feeds  PPx: none  Subjective:  Receiving glycopyrrolate every 4 hours. Patient seen lying in bed, no family at bedside. Appears comfortable and warm. Continues to be unresponsive, occasional leg movement.   Objective: Temp:  [101 F (38.3 C)-103.1 F (39.5 C)] 101 F (38.3 C) (03/27 0242) Pulse Rate:  [64-84] 64 (03/27 0242) BP: (126-135)/(57-70) 126/57 (03/27 0242) SpO2:  [93 %] 93 % (03/27 0242)  Physical Exam: General:  non-verbal, appears to be resting comfortably, in NAD, warm HEENT: dry mucous membranes   Cardiovascular: irregular rhythm. Regular rate.   Respiratory: Normal WOB on RA.  Gastrointestinal: soft, non-tender, non-distended  Neuro: head turned towards left, R facial droop  Laboratory: Most recent CBC Lab Results  Component Value Date   WBC 9.5 02/11/2023   HGB 9.9 (L) 02/19/2023   HCT 29.0 (L) 02/14/2023   MCV 89.0 02/22/2023   PLT 343 02/03/2023   Most recent BMP    Latest Ref Rng & Units 02/19/2023   12:54 PM  BMP  Glucose 70 - 99 mg/dL 104   BUN 8 - 23 mg/dL 26   Creatinine 0.44 - 1.00 mg/dL 1.10   Sodium 135 - 145 mmol/L 140   Potassium 3.5 - 5.1 mmol/L 4.1   Chloride 98 - 111 mmol/L 106     Cheryl Lundborg, MD 02/26/2023, 9:28 AM  PGY-1, [place] Intern pager: [phone], text pages welcome Secure chat group [place]

## 2023-03-03 NOTE — Plan of Care (Signed)
  Problem: Role Relationship: Goal: Family's ability to cope with current situation will improve Outcome: Progressing   

## 2023-03-03 NOTE — Progress Notes (Signed)
Brief Palliative Medicine Progress Note:  Went to see patient for terminal care follow up. Received report from primary RN - was informed patient is actively dying, not breathing. TOD was confirmed by two RNs at 1125. Patient passed peacefully with multiple family members at bedside.  Thank you for allowing PMT to assist in the care of this patient.  Kristan Votta M. [facility] Palliative Medicine Team Team Phone: [phone] NO CHARGE

## 2023-03-03 NOTE — Progress Notes (Signed)
03-22-23 IMM Letter respectfully not given, pt on Comfort Measures.

## 2023-03-03 NOTE — Progress Notes (Signed)
     Daily Progress Note Intern Pager: [phone]  Patient name: Cheryl Atkinson Medical record number: [id number] Date of birth: [birth year] Age: 89 y.o. Gender: female  Primary Care Provider: Marin Olp, MD Consultants: palliative, neurology (s/o) Code Status: DNR/DNI  Pt Overview and Major Events to Date:  3/25- admitted   Assessment and Plan: Cheryl Atkinson is a 89 y.o. female presenting as code stroke . Differential for presentation of this includes L MCA stroke .    PMHx includes Afib not on AC, CHF, COPD, GERD.   * Stroke [facility]) S/p large L MCA stroke, LKW 2130, out of window for thrombolysis and thrombectomy. Seen by neurology who spoke with family regarding limited prognosis and likelihood of worsening cerebral edema. Palliative care consulted and following. Family preference for palliative care in hospital, considering hospice facility placement at later day if still appropriate. -Palliative care following, appreciate assistance  -discussed with palliative care, patient can have 6 visitors at once.  -comfort care measures: tylenol, biotene (dry mouth), dulcolax for moderate constipation, robinul 0.4mg  IV q4h, haldol 0.5mg  SL q4h PRN for agitation or delirium, ativan 1mg  PRN for agitation or delirium, morphine for severe pain or SOB, zofran for nausea, liquifilm for dry eyes      HTN: stop amlodipine RLS: stop sinemet  CHF: stop lasix, irbesartan, Kclor HLD: stop crestor Afib: not on AC  FEN/GI: NPO unless awake enough for comfort feeds  PPx: none  Subjective:  Received PRN morphine yesterday and this AM. Febrile, hypotensive. Continues to be non-responsive. Sister, brother-in-law at bedside. No other acute needs identified at this time.   Objective: Temp:  [100.4 F (38 C)] 100.4 F (38 C) 2023-03-19 0328) Pulse Rate:  [89] 89 2023-03-19 0328) Resp:  [18] 18 03/19/2023 0328) BP: (109)/(54) 109/54 Mar 19, 2023 0328)  Physical Exam: General: non-verbal, appears to be resting  comfortably, in NAD, warm. Cold peripheral extremities. HEENT: dry mucous membranes   Cardiovascular: irregular rhythm. Regular rate.   Respiratory: Normal WOB on RA.  Gastrointestinal: soft, non-tender, non-distended  Neuro: head turned towards left, R facial droop  Laboratory: Most recent CBC Lab Results  Component Value Date   WBC 9.5 02/19/2023   HGB 9.9 (L) 02/26/2023   HCT 29.0 (L) 03/02/2023   MCV 89.0 02/12/2023   PLT 343 02/25/2023   Most recent BMP    Latest Ref Rng & Units 02/14/2023   12:54 PM  BMP  Glucose 70 - 99 mg/dL 104   BUN 8 - 23 mg/dL 26   Creatinine 0.44 - 1.00 mg/dL 1.10   Sodium 135 - 145 mmol/L 140   Potassium 3.5 - 5.1 mmol/L 4.1   Chloride 98 - 111 mmol/L 106     Rolanda Lundborg, MD 03/19/23, 9:58 AM  PGY-1, [place] Intern pager: [phone], text pages welcome Secure chat group [place]

## 2023-03-03 NOTE — Care Management Important Message (Signed)
Important Message  Patient Details  Name: Cheryl Atkinson MRN: [id number] Date of Birth: January 13, 1931   Medicare Important Message Given:  No     Hannah Beat 2023-03-05, 3:57 PM

## 2023-03-03 NOTE — Progress Notes (Signed)
Notified by RN that patient has died.   Primary team will submit death summary.  Attending Dr. Erin Hearing to sign the electronic death certificate.   Ezequiel Essex, MD

## 2023-03-03 NOTE — Discharge Summary (Addendum)
Family Medicine Teaching Service Death Summary  Patient name: Cheryl Atkinson record number: [id number] Date of birth: 1931-05-11 Age: 89 y.o. Gender: female Date of Admission: 02/04/2023  Date of Discharge: 03-12-2023 Admitting Physician: Leeanne Rio, MD  Primary Care Provider: Marin Olp, MD Consultants: palliative, neurology  Indication for Hospitalization: Large L MCA stroke   Brief Hospital Course:  Cheryl Atkinson is a 89 y.o.female with a history of Afib not on AC, CHF, COPD, GERD who was admitted to the [facility] Medicine Teaching Service at [facility] for large L MCA stroke. Her hospital course is detailed below:  Large L MCA Stroke Presented with left gaze deviation, R facial droop and altered mentation. Initiated code stroke and patient found to have large left MCA territory infarct involving large portions of the left frontal lobe, left parietal lobe, anterior left temporal lobe and the left insular cortex. Neurology consulted and patient not TNK candidate and discussed poor prognosis with family. Family opted for comfort care and in hospital hospice. Started on comfort care regimen and patient appeared peaceful in NAD. Patient pronounced dead on 03-12-23.  HTN: stop amlodipine RLS: stop sinemet  CHF: stop lasix, irbesartan, Kclor HLD: stop crestor Afib: not on [facility]  Discharge Diagnoses/Problem List:  * Stroke [facility])  Disposition: Death  Discharge Exam:  General: appears jaundiced HEENT: dry mucous membranes   Cardiovascular: no pulse Extremities: cold   DG Chest Portable 1 View  Result Date: 02/05/2023 CLINICAL DATA:  Altered mental status EXAM: PORTABLE CHEST 1 VIEW COMPARISON:  Previous studies including the examination of 08/11/2022 FINDINGS: Transverse diameter of heart is increased. There are no signs of pulmonary edema. Left hemidiaphragm is elevated. There is crowding of markings in left lower lung field. Rest of the lung fields are clear. Left  lateral CP angle is indistinct. There is no pneumothorax. There is possible old bone infarct in proximal right humerus. IMPRESSION: Cardiomegaly. There are no signs of pulmonary edema. Increased markings in left lower lung field may suggest crowding of bronchovascular structures due to elevation of left hemidiaphragm or atelectasis/pneumonia. Electronically Signed   By: Elmer Picker M.D.   On: 03/02/2023 13:44   CT ANGIO HEAD NECK W WO CM W PERF (CODE STROKE)  Result Date: 01/31/2023 CLINICAL DATA:  Neuro deficit, acute, stroke suspected. Left-sided vision issue with right-sided weakness. EXAM: CT ANGIOGRAPHY HEAD AND NECK CT PERFUSION BRAIN TECHNIQUE: Multidetector CT imaging of the head and neck was performed using the standard protocol during bolus administration of intravenous contrast. Multiplanar CT image reconstructions and MIPs were obtained to evaluate the vascular anatomy. Carotid stenosis measurements (when applicable) are obtained utilizing NASCET criteria, using the distal internal carotid diameter as the denominator. Multiphase CT imaging of the brain was performed following IV bolus contrast injection. Subsequent parametric perfusion maps were calculated using RAPID software. RADIATION DOSE REDUCTION: This exam was performed according to the departmental dose-optimization program which includes automated exposure control, adjustment of the mA and/or kV according to patient size and/or use of iterative reconstruction technique. CONTRAST:  181mL OMNIPAQUE IOHEXOL 350 MG/ML SOLN COMPARISON:  Head CT 02/12/2023. FINDINGS: CTA NECK FINDINGS Aortic arch: Three-vessel arch configuration. Atherosclerotic calcifications of the aortic arch and arch vessel origins. Arch vessel origins are patent. Right carotid system: No evidence of dissection, stenosis (50% or greater) or occlusion. Calcified plaque along the right carotid bulb and proximal right cervical ICA. Left carotid system: No evidence of  dissection, stenosis (50% or greater) or occlusion. Mild calcified  plaque along the left carotid bulb and proximal left cervical ICA. Vertebral arteries: Tortuous V1 segments bilaterally. Otherwise, patent from the origin to the confluence with the basilar without stenosis or dissection. Skeleton: No suspicious bone lesions. Other neck: Unremarkable. Upper chest: Unremarkable. Review of the MIP images confirms the above findings CTA HEAD FINDINGS Anterior circulation: Calcified plaque along the carotid siphons without hemodynamically significant stenosis. Mild ectasia of the cavernous and clinoid segments bilaterally. Occlusion of the distal M1 segment of the left MCA. The right MCA and ACAs are patent proximally without aneurysm or stenosis. Diminished distal branches in the left MCA and ACA territories. Posterior circulation: Normal basilar artery. The SCAs, AICAs and PICAs are patent proximally. The PCAs are patent proximally without stenosis or aneurysm. Distal branches are symmetric. Venous sinuses: As permitted by contrast timing, patent. Anatomic variants: None. Review of the MIP images confirms the above findings CT Brain Perfusion Findings: ASPECTS: 2 CBF (<30%) Volume: 146mL Perfusion (Tmax>6.0s) volume: 128mL Mismatch Volume: 33mL, ratio 1.1 Infarction Location:Left MCA territory. IMPRESSION: 1. Occlusion of the distal M1 segment of the left MCA. 2. No hemodynamically significant stenosis in the neck. 3. Perfusion imaging demonstrates 153 mL of core infarct in the left MCA territory with 19 mL of penumbra. 4. Aortic Atherosclerosis (ICD10-I70.0). Electronically Signed   By: Emmit Alexanders M.D.   On: 02/10/2023 13:21   CT HEAD CODE STROKE WO CONTRAST  Result Date: 03/01/2023 CLINICAL DATA:  Code stroke.  Stroke suspected EXAM: CT HEAD WITHOUT CONTRAST TECHNIQUE: Contiguous axial images were obtained from the base of the skull through the vertex without intravenous contrast. RADIATION DOSE REDUCTION:  This exam was performed according to the departmental dose-optimization program which includes automated exposure control, adjustment of the mA and/or kV according to patient size and/or use of iterative reconstruction technique. COMPARISON:  None Available. FINDINGS: Brain: Large left MCA territory infarct involving large portions of the left frontal lobe, left parietal lobe, anterior left temporal lobe and the left insular cortex. There is likely also an infarct in the left insular cortex. Vascular: Hyperdense left MCA Skull: Normal. Negative for fracture or focal lesion. Sinuses/Orbits: No middle ear or mastoid effusion. Mucosal thickening left maxillary and bilateral sphenoid sinuses. Bilateral lens replacement. Orbits are otherwise unremarkable. Other: None. ASPECTS ([place] Stroke Program Early CT Score) - Ganglionic level infarction (caudate, lentiform nuclei, internal capsule, insula, M1-M3 cortex): 2 - Supraganglionic infarction (M4-M6 cortex): 0 Total score (0-10 with 10 being normal): 2 IMPRESSION: Large left MCA territory infarct involving large portions of the left frontal lobe, left parietal lobe, anterior left temporal lobe and the left insular cortex. Aspects is 2. No hemorrhage. Findings were discussed with Dr. Pearline Cables on 02/16/2023 at 12:56 PM. Electronically Signed   By: Marin Roberts M.D.   On: 02/03/2023 12:57     Significant Labs and Imaging:  No results for input(s): "WBC", "HGB", "HCT", "PLT" in the last 48 hours. No results for input(s): "NA", "K", "CL", "CO2", "GLUCOSE", "BUN", "CREATININE", "CALCIUM", "MG", "PHOS", "ALKPHOS", "AST", "ALT", "ALBUMIN", "PROTEIN" in the last 48 hours.  Results/Tests Pending at Time of Discharge: None  Discharge Medications:  None  Discharge Instructions: None   Rolanda Lundborg, MD March 29, 2023, 12:24 PM PGY-1, [place]

## 2023-03-03 DEATH — deceased

## 2023-03-17 ENCOUNTER — Ambulatory Visit: Payer: Medicare Other | Admitting: Family Medicine

## 2023-03-18 ENCOUNTER — Ambulatory Visit: Payer: Medicare Other | Admitting: Family Medicine

## 2023-04-23 ENCOUNTER — Ambulatory Visit: Payer: Medicare Other | Admitting: Neurology
